# Patient Record
Sex: Female | Born: 1950 | Race: White | Hispanic: No | Smoking: Never smoker
Health system: Southern US, Community
[De-identification: ages and names within clinical notes are randomized; demographics above are authoritative.]

## PROBLEM LIST (undated history)

## (undated) DIAGNOSIS — K219 Gastro-esophageal reflux disease without esophagitis: Secondary | ICD-10-CM

## (undated) DIAGNOSIS — E119 Type 2 diabetes mellitus without complications: Secondary | ICD-10-CM

## (undated) DIAGNOSIS — E039 Hypothyroidism, unspecified: Secondary | ICD-10-CM

## (undated) DIAGNOSIS — C50919 Malignant neoplasm of unspecified site of unspecified female breast: Secondary | ICD-10-CM

## (undated) DIAGNOSIS — C801 Malignant (primary) neoplasm, unspecified: Secondary | ICD-10-CM

## (undated) DIAGNOSIS — I509 Heart failure, unspecified: Secondary | ICD-10-CM

## (undated) DIAGNOSIS — R569 Unspecified convulsions: Secondary | ICD-10-CM

## (undated) DIAGNOSIS — I10 Essential (primary) hypertension: Secondary | ICD-10-CM

## (undated) DIAGNOSIS — G43909 Migraine, unspecified, not intractable, without status migrainosus: Secondary | ICD-10-CM

## (undated) HISTORY — PX: APPENDECTOMY: SHX54

## (undated) HISTORY — PX: OTHER SURGICAL HISTORY: SHX169

## (undated) HISTORY — PX: ABDOMINAL HYSTERECTOMY: SHX81

## (undated) HISTORY — PX: KNEE SURGERY: SHX244

## (undated) HISTORY — PX: MASTECTOMY: SHX3

## (undated) HISTORY — PX: CHOLECYSTECTOMY: SHX55

## (undated) HISTORY — PX: ABDOMINAL SURGERY: SHX537

## (undated) HISTORY — PX: TONSILLECTOMY: SUR1361

---

## 2008-04-12 DIAGNOSIS — R569 Unspecified convulsions: Secondary | ICD-10-CM | POA: Diagnosis present

## 2009-01-26 DIAGNOSIS — N289 Disorder of kidney and ureter, unspecified: Secondary | ICD-10-CM | POA: Diagnosis not present

## 2010-05-25 DIAGNOSIS — G4700 Insomnia, unspecified: Secondary | ICD-10-CM | POA: Insufficient documentation

## 2010-05-25 DIAGNOSIS — F411 Generalized anxiety disorder: Secondary | ICD-10-CM | POA: Insufficient documentation

## 2012-04-17 DIAGNOSIS — E1142 Type 2 diabetes mellitus with diabetic polyneuropathy: Secondary | ICD-10-CM | POA: Insufficient documentation

## 2012-11-06 DIAGNOSIS — I119 Hypertensive heart disease without heart failure: Secondary | ICD-10-CM | POA: Insufficient documentation

## 2012-11-10 DIAGNOSIS — G43909 Migraine, unspecified, not intractable, without status migrainosus: Secondary | ICD-10-CM | POA: Insufficient documentation

## 2013-01-20 DIAGNOSIS — C50912 Malignant neoplasm of unspecified site of left female breast: Secondary | ICD-10-CM | POA: Insufficient documentation

## 2013-03-15 DIAGNOSIS — R52 Pain, unspecified: Secondary | ICD-10-CM | POA: Insufficient documentation

## 2013-03-16 DIAGNOSIS — D649 Anemia, unspecified: Secondary | ICD-10-CM | POA: Diagnosis present

## 2013-07-01 DIAGNOSIS — G8929 Other chronic pain: Secondary | ICD-10-CM | POA: Diagnosis present

## 2013-08-04 DIAGNOSIS — Z87898 Personal history of other specified conditions: Secondary | ICD-10-CM | POA: Insufficient documentation

## 2013-09-16 DIAGNOSIS — E785 Hyperlipidemia, unspecified: Secondary | ICD-10-CM | POA: Insufficient documentation

## 2013-10-05 DIAGNOSIS — R197 Diarrhea, unspecified: Secondary | ICD-10-CM | POA: Diagnosis present

## 2013-11-28 ENCOUNTER — Inpatient Hospital Stay (HOSPITAL_BASED_OUTPATIENT_CLINIC_OR_DEPARTMENT_OTHER)
Admission: EM | Admit: 2013-11-28 | Discharge: 2013-12-06 | DRG: 184 | Disposition: A | Discharge status: Home Health | Payer: Medicare PPO | Attending: General Surgery | Admitting: General Surgery

## 2013-11-28 ENCOUNTER — Emergency Department (HOSPITAL_BASED_OUTPATIENT_CLINIC_OR_DEPARTMENT_OTHER): Payer: Medicare PPO

## 2013-11-28 ENCOUNTER — Encounter (HOSPITAL_BASED_OUTPATIENT_CLINIC_OR_DEPARTMENT_OTHER): Payer: Self-pay | Admitting: Emergency Medicine

## 2013-11-28 ENCOUNTER — Other Ambulatory Visit: Payer: Self-pay

## 2013-11-28 DIAGNOSIS — E039 Hypothyroidism, unspecified: Secondary | ICD-10-CM | POA: Diagnosis not present

## 2013-11-28 DIAGNOSIS — E1165 Type 2 diabetes mellitus with hyperglycemia: Secondary | ICD-10-CM | POA: Diagnosis not present

## 2013-11-28 DIAGNOSIS — Z9221 Personal history of antineoplastic chemotherapy: Secondary | ICD-10-CM

## 2013-11-28 DIAGNOSIS — Z853 Personal history of malignant neoplasm of breast: Secondary | ICD-10-CM | POA: Diagnosis not present

## 2013-11-28 DIAGNOSIS — G3189 Other specified degenerative diseases of nervous system: Secondary | ICD-10-CM | POA: Diagnosis not present

## 2013-11-28 DIAGNOSIS — I672 Cerebral atherosclerosis: Secondary | ICD-10-CM | POA: Diagnosis present

## 2013-11-28 DIAGNOSIS — Z9013 Acquired absence of bilateral breasts and nipples: Secondary | ICD-10-CM | POA: Diagnosis present

## 2013-11-28 DIAGNOSIS — Z794 Long term (current) use of insulin: Secondary | ICD-10-CM | POA: Diagnosis not present

## 2013-11-28 DIAGNOSIS — R410 Disorientation, unspecified: Secondary | ICD-10-CM | POA: Diagnosis not present

## 2013-11-28 DIAGNOSIS — R4182 Altered mental status, unspecified: Secondary | ICD-10-CM

## 2013-11-28 DIAGNOSIS — R079 Chest pain, unspecified: Secondary | ICD-10-CM | POA: Diagnosis present

## 2013-11-28 DIAGNOSIS — S2241XA Multiple fractures of ribs, right side, initial encounter for closed fracture: Secondary | ICD-10-CM | POA: Diagnosis present

## 2013-11-28 DIAGNOSIS — S2231XA Fracture of one rib, right side, initial encounter for closed fracture: Secondary | ICD-10-CM

## 2013-11-28 DIAGNOSIS — N390 Urinary tract infection, site not specified: Secondary | ICD-10-CM | POA: Diagnosis not present

## 2013-11-28 DIAGNOSIS — G40909 Epilepsy, unspecified, not intractable, without status epilepticus: Secondary | ICD-10-CM | POA: Diagnosis present

## 2013-11-28 DIAGNOSIS — W1811XA Fall from or off toilet without subsequent striking against object, initial encounter: Secondary | ICD-10-CM | POA: Diagnosis present

## 2013-11-28 DIAGNOSIS — N289 Disorder of kidney and ureter, unspecified: Secondary | ICD-10-CM | POA: Diagnosis not present

## 2013-11-28 DIAGNOSIS — I10 Essential (primary) hypertension: Secondary | ICD-10-CM | POA: Diagnosis not present

## 2013-11-28 DIAGNOSIS — Z9882 Breast implant status: Secondary | ICD-10-CM | POA: Diagnosis not present

## 2013-11-28 DIAGNOSIS — S2249XA Multiple fractures of ribs, unspecified side, initial encounter for closed fracture: Secondary | ICD-10-CM | POA: Diagnosis present

## 2013-11-28 DIAGNOSIS — W19XXXA Unspecified fall, initial encounter: Secondary | ICD-10-CM

## 2013-11-28 HISTORY — DX: Essential (primary) hypertension: I10

## 2013-11-28 HISTORY — DX: Type 2 diabetes mellitus without complications: E11.9

## 2013-11-28 HISTORY — DX: Hypothyroidism, unspecified: E03.9

## 2013-11-28 HISTORY — DX: Migraine, unspecified, not intractable, without status migrainosus: G43.909

## 2013-11-28 HISTORY — DX: Malignant neoplasm of unspecified site of unspecified female breast: C50.919

## 2013-11-28 HISTORY — DX: Gastro-esophageal reflux disease without esophagitis: K21.9

## 2013-11-28 LAB — COMPREHENSIVE METABOLIC PANEL
ALK PHOS: 109 U/L (ref 39–117)
ALT: 39 U/L — ABNORMAL HIGH (ref 0–35)
AST: 37 U/L (ref 0–37)
Albumin: 3.6 g/dL (ref 3.5–5.2)
Anion gap: 13 (ref 5–15)
BILIRUBIN TOTAL: 0.6 mg/dL (ref 0.3–1.2)
BUN: 14 mg/dL (ref 6–23)
CHLORIDE: 96 meq/L (ref 96–112)
CO2: 28 meq/L (ref 19–32)
Calcium: 9.8 mg/dL (ref 8.4–10.5)
Creatinine, Ser: 0.6 mg/dL (ref 0.50–1.10)
GFR calc Af Amer: >90 mL/min (ref >90)
GLUCOSE: 284 mg/dL — AB (ref 70–99)
POTASSIUM: 3.7 meq/L (ref 3.7–5.3)
SODIUM: 137 meq/L (ref 137–147)
Total Protein: 7.9 g/dL (ref 6.0–8.3)

## 2013-11-28 LAB — CBC WITH DIFFERENTIAL/PLATELET
Basophils Absolute: 0.1 10*3/uL (ref 0.0–0.1)
Basophils Relative: 1 % (ref 0–1)
EOS ABS: 0.6 10*3/uL (ref 0.0–0.7)
EOS PCT: 6 % — AB (ref 0–5)
HCT: 35.6 % — ABNORMAL LOW (ref 36.0–46.0)
Hemoglobin: 12.6 g/dL (ref 12.0–15.0)
LYMPHS ABS: 2.7 10*3/uL (ref 0.7–4.0)
Lymphocytes Relative: 27 % (ref 12–46)
MCH: 31.8 pg (ref 26.0–34.0)
MCHC: 35.4 g/dL (ref 30.0–36.0)
MCV: 89.9 fL (ref 78.0–100.0)
Monocytes Absolute: 0.8 10*3/uL (ref 0.1–1.0)
Monocytes Relative: 8 % (ref 3–12)
Neutro Abs: 5.9 10*3/uL (ref 1.7–7.7)
Neutrophils Relative %: 58 % (ref 43–77)
Platelets: 353 10*3/uL (ref 150–400)
RBC: 3.96 MIL/uL (ref 3.87–5.11)
RDW: 12.8 % (ref 11.5–15.5)
WBC: 10 10*3/uL (ref 4.0–10.5)

## 2013-11-28 LAB — GLUCOSE, CAPILLARY: GLUCOSE-CAPILLARY: 277 mg/dL — AB (ref 70–99)

## 2013-11-28 LAB — MRSA PCR SCREENING: MRSA by PCR: NEGATIVE

## 2013-11-28 MED ORDER — HYDROMORPHONE 0.3 MG/ML IV SOLN
INTRAVENOUS | Status: DC
  Administered 2013-11-28: 20:00:00 via INTRAVENOUS
  Administered 2013-11-29: 1.2 mg via INTRAVENOUS
  Administered 2013-11-29: 0.9 mg via INTRAVENOUS
  Administered 2013-11-29: 2 mg via INTRAVENOUS
  Administered 2013-11-29: 0.9 mg via INTRAVENOUS
  Administered 2013-11-29: 1.2 mg via INTRAVENOUS
  Administered 2013-11-29: 1.8 mg via INTRAVENOUS
  Administered 2013-11-29: 15:00:00 via INTRAVENOUS
  Administered 2013-11-30: 1.5 mg via INTRAVENOUS
  Administered 2013-11-30 (×2): 0.6 mg via INTRAVENOUS
  Filled 2013-11-28 (×2): qty 25

## 2013-11-28 MED ORDER — NALOXONE HCL 0.4 MG/ML IJ SOLN: 0.4000 mg | INTRAMUSCULAR | Status: DC | PRN

## 2013-11-28 MED ORDER — DIVALPROEX SODIUM 500 MG PO DR TAB
500.0000 mg | DELAYED_RELEASE_TABLET | Freq: Three times a day (TID) | ORAL | Status: DC
  Administered 2013-11-28 – 2013-12-06 (×23): 500 mg via ORAL
  Filled 2013-11-28 (×25): qty 1

## 2013-11-28 MED ORDER — LISINOPRIL 10 MG PO TABS
10.0000 mg | ORAL_TABLET | Freq: Every day | ORAL | Status: DC
  Administered 2013-11-28 – 2013-12-06 (×9): 10 mg via ORAL
  Filled 2013-11-28 (×9): qty 1

## 2013-11-28 MED ORDER — PANTOPRAZOLE SODIUM 40 MG IV SOLR
40.0000 mg | INTRAVENOUS | Status: DC
  Filled 2013-11-28 (×3): qty 40

## 2013-11-28 MED ORDER — DIPHENHYDRAMINE HCL 50 MG/ML IJ SOLN: 12.5000 mg | Freq: Four times a day (QID) | INTRAMUSCULAR | Status: DC | PRN

## 2013-11-28 MED ORDER — ALPRAZOLAM 0.5 MG PO TABS
0.5000 mg | ORAL_TABLET | Freq: Four times a day (QID) | ORAL | Status: DC
  Administered 2013-11-28 – 2013-12-06 (×23): 0.5 mg via ORAL
  Filled 2013-11-28 (×23): qty 1

## 2013-11-28 MED ORDER — DIPHENHYDRAMINE HCL 12.5 MG/5ML PO ELIX
12.5000 mg | ORAL_SOLUTION | Freq: Four times a day (QID) | ORAL | Status: DC | PRN
  Filled 2013-11-28: qty 5

## 2013-11-28 MED ORDER — OXYCODONE HCL 5 MG PO TABS: 2.5000 mg | ORAL_TABLET | ORAL | Status: DC | PRN

## 2013-11-28 MED ORDER — SODIUM CHLORIDE 0.9 % IV BOLUS (SEPSIS)
1000.0000 mL | Freq: Once | INTRAVENOUS | Status: AC
  Administered 2013-11-28: 1000 mL via INTRAVENOUS

## 2013-11-28 MED ORDER — OXYCODONE HCL 5 MG PO TABS
5.0000 mg | ORAL_TABLET | ORAL | Status: DC | PRN
  Administered 2013-11-29 – 2013-11-30 (×2): 5 mg via ORAL
  Filled 2013-11-28 (×2): qty 1

## 2013-11-28 MED ORDER — HYDROMORPHONE HCL 1 MG/ML IJ SOLN
1.0000 mg | Freq: Once | INTRAMUSCULAR | Status: AC
  Administered 2013-11-28: 1 mg via INTRAVENOUS
  Filled 2013-11-28: qty 1

## 2013-11-28 MED ORDER — SODIUM CHLORIDE 0.9 % IV SOLN
Freq: Once | INTRAVENOUS | Status: AC
  Administered 2013-11-28: 16:00:00 via INTRAVENOUS

## 2013-11-28 MED ORDER — INSULIN ASPART 100 UNIT/ML ~~LOC~~ SOLN
0.0000 [IU] | Freq: Every day | SUBCUTANEOUS | Status: DC
  Administered 2013-11-28: 3 [IU] via SUBCUTANEOUS
  Administered 2013-11-29: 2 [IU] via SUBCUTANEOUS

## 2013-11-28 MED ORDER — PANTOPRAZOLE SODIUM 40 MG PO TBEC
40.0000 mg | DELAYED_RELEASE_TABLET | ORAL | Status: DC
  Administered 2013-11-28 – 2013-12-01 (×4): 40 mg via ORAL
  Filled 2013-11-28 (×4): qty 1

## 2013-11-28 MED ORDER — POLYETHYLENE GLYCOL 3350 17 G PO PACK
17.0000 g | PACK | Freq: Every day | ORAL | Status: DC
  Administered 2013-11-28 – 2013-12-05 (×8): 17 g via ORAL
  Filled 2013-11-28 (×9): qty 1

## 2013-11-28 MED ORDER — SODIUM CHLORIDE 0.9 % IJ SOLN: 9.0000 mL | INTRAMUSCULAR | Status: DC | PRN

## 2013-11-28 MED ORDER — ONDANSETRON HCL 4 MG PO TABS
4.0000 mg | ORAL_TABLET | Freq: Four times a day (QID) | ORAL | Status: DC | PRN
  Administered 2013-11-29 – 2013-12-03 (×2): 4 mg via ORAL
  Filled 2013-11-28 (×2): qty 1

## 2013-11-28 MED ORDER — GABAPENTIN 300 MG PO CAPS
300.0000 mg | ORAL_CAPSULE | Freq: Three times a day (TID) | ORAL | Status: DC
  Administered 2013-11-28 – 2013-12-06 (×23): 300 mg via ORAL
  Filled 2013-11-28 (×27): qty 1

## 2013-11-28 MED ORDER — ONDANSETRON HCL 4 MG/2ML IJ SOLN
4.0000 mg | Freq: Four times a day (QID) | INTRAMUSCULAR | Status: DC | PRN
  Administered 2013-11-29: 4 mg via INTRAVENOUS
  Filled 2013-11-28 (×2): qty 2

## 2013-11-28 MED ORDER — ONDANSETRON HCL 4 MG/2ML IJ SOLN
4.0000 mg | Freq: Once | INTRAMUSCULAR | Status: AC
  Administered 2013-11-28: 4 mg via INTRAVENOUS
  Filled 2013-11-28: qty 2

## 2013-11-28 MED ORDER — LEVOTHYROXINE SODIUM 200 MCG PO TABS
200.0000 ug | ORAL_TABLET | Freq: Every day | ORAL | Status: DC
  Administered 2013-11-29 – 2013-12-06 (×8): 200 ug via ORAL
  Filled 2013-11-28 (×2): qty 1
  Filled 2013-11-28: qty 2
  Filled 2013-11-28 (×5): qty 1
  Filled 2013-11-28: qty 2
  Filled 2013-11-28 (×2): qty 1

## 2013-11-28 MED ORDER — NIACIN 250 MG PO TABS
250.0000 mg | ORAL_TABLET | Freq: Every day | ORAL | Status: DC
  Administered 2013-11-28 – 2013-12-05 (×8): 250 mg via ORAL
  Filled 2013-11-28 (×9): qty 1

## 2013-11-28 MED ORDER — POTASSIUM CHLORIDE IN NACL 20-0.9 MEQ/L-% IV SOLN
INTRAVENOUS | Status: DC
  Administered 2013-11-29: 15:00:00 via INTRAVENOUS
  Filled 2013-11-28 (×6): qty 1000

## 2013-11-28 MED ORDER — OXYCODONE HCL 5 MG PO TABS
10.0000 mg | ORAL_TABLET | ORAL | Status: DC | PRN
  Administered 2013-11-28 – 2013-12-02 (×6): 10 mg via ORAL
  Filled 2013-11-28 (×7): qty 2

## 2013-11-28 MED ORDER — CYCLOBENZAPRINE HCL 10 MG PO TABS
10.0000 mg | ORAL_TABLET | Freq: Three times a day (TID) | ORAL | Status: DC | PRN
  Administered 2013-12-02 – 2013-12-06 (×2): 10 mg via ORAL
  Filled 2013-11-28 (×2): qty 1

## 2013-11-28 MED ORDER — ONDANSETRON HCL 4 MG/2ML IJ SOLN: 4.0000 mg | Freq: Four times a day (QID) | INTRAMUSCULAR | Status: DC | PRN

## 2013-11-28 MED ORDER — ENOXAPARIN SODIUM 40 MG/0.4ML ~~LOC~~ SOLN
40.0000 mg | SUBCUTANEOUS | Status: DC
  Administered 2013-11-28 – 2013-12-05 (×8): 40 mg via SUBCUTANEOUS
  Filled 2013-11-28 (×11): qty 0.4

## 2013-11-28 MED ORDER — INSULIN ASPART 100 UNIT/ML ~~LOC~~ SOLN
0.0000 [IU] | Freq: Three times a day (TID) | SUBCUTANEOUS | Status: DC
Start: 2013-11-29 — End: 2013-12-06
  Administered 2013-11-29 (×2): 3 [IU] via SUBCUTANEOUS
  Administered 2013-11-30: 2 [IU] via SUBCUTANEOUS
  Administered 2013-11-30: 3 [IU] via SUBCUTANEOUS
  Administered 2013-11-30: 2 [IU] via SUBCUTANEOUS
  Administered 2013-12-01: 3 [IU] via SUBCUTANEOUS
  Administered 2013-12-01: 5 [IU] via SUBCUTANEOUS
  Administered 2013-12-01 – 2013-12-05 (×7): 2 [IU] via SUBCUTANEOUS
  Administered 2013-12-05: 5 [IU] via SUBCUTANEOUS
  Administered 2013-12-06: 2 [IU] via SUBCUTANEOUS
  Administered 2013-12-06: 3 [IU] via SUBCUTANEOUS

## 2013-11-28 NOTE — ED Notes (Signed)
Dr. Blackman at bedside. 

## 2013-11-28 NOTE — ED Notes (Signed)
To ED form home via [facility], had breast surgery 11/2, fell 11/4, seen and dc that day, c/o right sided back and abd pain, VSS, CBG 309, A/O X4, ambulatory with difficulty, has not been taking home meds (insulin, HTN meds)

## 2013-11-28 NOTE — ED Notes (Signed)
Bruising noted to right rib/upper abdomen.

## 2013-11-28 NOTE — ED Notes (Addendum)
Pt here for trauma consult for ribs fracture.

## 2013-11-28 NOTE — ED Provider Notes (Signed)
CSN: [id number]     Arrival date & time 11/28/13  1116 History   First MD Initiated Contact with Patient 11/28/13 1144     Chief Complaint  Patient presents with  . Fall  . Post-op Problem     (Consider location/radiation/quality/duration/timing/severity/associated sxs/prior Treatment) HPI Comments: Patient is a 63 year old female with history of diabetes and breast cancer. She has been through chemotherapy and recently underwent breast reconstruction surgery. This was one week ago. She was doing well for the first 2 days, then on the third day apparently fell when her bedside commode collapsed. She was seen at [facility] and had multiple imaging studies performed, none of which revealed any fracture or acute injury. She was discharged to home where she states she is unable to function. She reports severe pain in her right rear lower rib cage and back. She states it makes it difficult for her to move or even roll over in bed. She lives at home by herself and states that she has been into much pain to take her insulin and other medications.  Patient is a 63 y.o. female presenting with fall. The history is provided by the patient.  Fall This is a new problem. The current episode started 2 days ago. The problem occurs constantly. The problem has not changed since onset.Pertinent negatives include no shortness of breath. Exacerbated by: movement and palpation. Nothing relieves the symptoms. She has tried nothing for the symptoms.    Past Medical History  Diagnosis Date  . Hypertension   . Breast cancer   . Diabetes mellitus without complication   . Migraine   . Hypothyroid   . Epilepsy   . Esophageal reflux    Past Surgical History  Procedure Laterality Date  . Mastectomy    . Knee surgery    . Appendectomy    . Abdominal hysterectomy     No family history on file. History  Substance Use Topics  . Smoking status: Never Smoker   . Smokeless tobacco: Not on file  . Alcohol  Use: No   OB History    No data available     Review of Systems  Respiratory: Negative for shortness of breath.   All other systems reviewed and are negative.     Allergies  Aspirin; Erythromycin; Maxzide; Metformin and related; Morphine and related; Nsaids; Other; Pravachol; Stadol; and Vistaril  Home Medications   Prior to Admission medications   Medication Sig Start Date End Date Taking? Authorizing Provider  cyclobenzaprine (FLEXERIL) 10 MG tablet Take 10 mg by mouth 3 (three) times daily as needed for muscle spasms.   Yes Historical Provider, MD  divalproex (DEPAKOTE) 500 MG DR tablet Take 500 mg by mouth 3 (three) times daily.   Yes Historical Provider, MD  gabapentin (NEURONTIN) 300 MG capsule Take 300 mg by mouth 3 (three) times daily.   Yes Historical Provider, MD  HYDROmorphone (DILAUDID) 2 MG tablet Take 2 mg by mouth every 4 (four) hours as needed for severe pain.   Yes Historical Provider, MD  insulin aspart (NOVOLOG) 100 UNIT/ML injection Inject into the skin 3 (three) times daily before meals.   Yes Historical Provider, MD  insulin glargine (LANTUS) 100 UNIT/ML injection Inject 30 Units into the skin daily.   Yes Historical Provider, MD  levothyroxine (SYNTHROID, LEVOTHROID) 200 MCG tablet Take 200 mcg by mouth daily before breakfast.   Yes Historical Provider, MD  lisinopril (PRINIVIL,ZESTRIL) 10 MG tablet Take 10 mg by mouth daily.  Yes Historical Provider, MD  loperamide (IMODIUM) 2 MG capsule Take by mouth as needed for diarrhea or loose stools.   Yes Historical Provider, MD  niacin 250 MG tablet Take 250 mg by mouth at bedtime.   Yes Historical Provider, MD  ondansetron (ZOFRAN-ODT) 4 MG disintegrating tablet Take 4 mg by mouth every 8 (eight) hours as needed for nausea or vomiting.   Yes Historical Provider, MD  senna-docusate (SENOKOT-S) 8.6-50 MG per tablet Take 1 tablet by mouth daily.   Yes Historical Provider, MD  zolpidem (AMBIEN) 10 MG tablet Take 10 mg by  mouth at bedtime as needed for sleep.   Yes Historical Provider, MD   BP 173/92 mmHg  Pulse 109  Temp(Src) 97.9 F (36.6 C) (Oral)  Resp 20  SpO2 99% Physical Exam  Constitutional: She is oriented to person, place, and time.  Patient is very anxious and tearful. She is sobbing. She appears chronically ill and somewhat pale.  HENT:  Head: Normocephalic and atraumatic.  Eyes: EOM are normal. Pupils are equal, round, and reactive to light.  Neck: Normal range of motion. Neck supple.  Cardiovascular: Normal rate and regular rhythm.  Exam reveals no gallop and no friction rub.   No murmur heard. Pulmonary/Chest: Effort normal and breath sounds normal. No respiratory distress. She has no wheezes.  There is an ecchymotic area to the right rear lower rib cage and flank. There is no crepitus.  Abdominal: Soft. Bowel sounds are normal. She exhibits no distension. There is no tenderness.  Musculoskeletal: Normal range of motion.  Neurological: She is alert and oriented to person, place, and time. No cranial nerve deficit. She exhibits normal muscle tone. Coordination normal.  Skin: Skin is warm and dry. She is not diaphoretic.  Nursing note and vitals reviewed.   ED Course  Procedures (including critical care time) Labs Review Labs Reviewed  COMPREHENSIVE METABOLIC PANEL  CBC WITH DIFFERENTIAL  URINALYSIS, ROUTINE W REFLEX MICROSCOPIC    Imaging Review No results found.   Date: 11/29/2013  Rate: 106  Rhythm: sinus tach  QRS Axis: normal  Intervals: normal  ST/T Wave abnormalities: none  Conduction Disutrbances:none  Narrative Interpretation:   Old EKG Reviewed: none available    MDM   Final diagnoses:  Fall    Patient presents with severe pain in the right ribs since a fall four days ago.  She was seen at [facility] and nothing was found.  Xrays today reveal fractures of ribs 4-9 on the right.  She was given pain medication but has not helped much.  Her electrolytes are  unremarkable with the exception of hyperglycemia.    I have discussed the rib fractures with Dr. Ninfa Linden from trauma.  He has agreed to accept the patient in transfer.  The patient has requested to not go back to [place] where she has received the majority of her care.      Veryl Speak, MD 11/29/13 [phone]

## 2013-11-28 NOTE — ED Notes (Signed)
[facility] to bedside to transfer pt, bedside report given to [employer]

## 2013-11-28 NOTE — H&P (Signed)
History   Andrea [name] is an 63 y.o. female.   Chief Complaint:  Chief Complaint  Patient presents with  . Fall  . Post-op Problem    Fall  this is a 63 year old female who presents after a fall that occurred several days ago. She is a breast cancer patient and underwent exchange of her tissue expanders to formal implants bilaterally at [facility] last week. She fell going to her bedside commode several days ago. She presented to [facility] with right-sided chest pain. Apparently a complete workup including CAT scans were unremarkable so she was discharged. She presented to [facility] [place] today because of increasing pain. She does not have shortness of breath but has chest pain trying to breathe deeply. This is all on the right side. She has some right-sided abdominal pain as well. She has no nausea or vomiting. She did not hit her head and there was no loss of consciousness.  Past Medical History  Diagnosis Date  . Hypertension   . Breast cancer   . Diabetes mellitus without complication   . Migraine   . Hypothyroid   . Epilepsy   . Esophageal reflux     Past Surgical History  Procedure Laterality Date  . Mastectomy    . Knee surgery    . Appendectomy    . Abdominal hysterectomy    Port-A-Cath insertion Bilateral tissue expander insertion and then formal placement of bilateral implants  No family history on file. Social History:  reports that she has never smoked. She does not have any smokeless tobacco history on file. She reports that she does not drink alcohol. Her drug history is not on file.  Allergies   Allergies  Allergen Reactions  . Aspirin   . Erythromycin   . Maxzide [Hydrochlorothiazide W-Triamterene]   . Metformin And Related   . Morphine And Related   . Nsaids   . Other     All steroids produce psychosis per pt  . Pravachol [Pravastatin]   . Stadol [Butorphanol]   . Vistaril [Hydroxyzine Hcl]     Home Medications   (Not in a  hospital admission)  Trauma Course   Results for orders placed or performed during the hospital encounter of 11/28/13 (from the past 48 hour(s))  Comprehensive metabolic panel     Status: Abnormal   Collection Time: 11/28/13  1:00 PM  Result Value Ref Range   Sodium 137 137 - 147 mEq/L   Potassium 3.7 3.7 - 5.3 mEq/L   Chloride 96 96 - 112 mEq/L   CO2 28 19 - 32 mEq/L   Glucose, Bld 284 (H) 70 - 99 mg/dL   BUN 14 6 - 23 mg/dL   Creatinine, Ser 0.60 0.50 - 1.10 mg/dL   Calcium 9.8 8.4 - 10.5 mg/dL   Total Protein 7.9 6.0 - 8.3 g/dL   Albumin 3.6 3.5 - 5.2 g/dL   AST 37 0 - 37 U/L   ALT 39 (H) 0 - 35 U/L   Alkaline Phosphatase 109 39 - 117 U/L   Total Bilirubin 0.6 0.3 - 1.2 mg/dL   GFR calc non Af Amer >90 >90 mL/min   GFR calc Af Amer >90 >90 mL/min    Comment: (NOTE) The eGFR has been calculated using the CKD EPI equation. This calculation has not been validated in all clinical situations. eGFR's persistently <90 mL/min signify possible Chronic Kidney Disease.    Anion gap 13 5 - 15  CBC with Differential  Status: Abnormal   Collection Time: 11/28/13  1:00 PM  Result Value Ref Range   WBC 10.0 4.0 - 10.5 K/uL   RBC 3.96 3.87 - 5.11 MIL/uL   Hemoglobin 12.6 12.0 - 15.0 g/dL   HCT 35.6 (L) 36.0 - 46.0 %   MCV 89.9 78.0 - 100.0 fL   MCH 31.8 26.0 - 34.0 pg   MCHC 35.4 30.0 - 36.0 g/dL   RDW 12.8 11.5 - 15.5 %   Platelets 353 150 - 400 K/uL   Neutrophils Relative % 58 43 - 77 %   Neutro Abs 5.9 1.7 - 7.7 K/uL   Lymphocytes Relative 27 12 - 46 %   Lymphs Abs 2.7 0.7 - 4.0 K/uL   Monocytes Relative 8 3 - 12 %   Monocytes Absolute 0.8 0.1 - 1.0 K/uL   Eosinophils Relative 6 (H) 0 - 5 %   Eosinophils Absolute 0.6 0.0 - 0.7 K/uL   Basophils Relative 1 0 - 1 %   Basophils Absolute 0.1 0.0 - 0.1 K/uL   Dg Ribs Unilateral W/chest Right  11/28/2013   CLINICAL DATA:  Fall, right anterior rib pain, history of bilateral breast cancer status post bilateral mastectomy with  chemotherapy  EXAM: RIGHT RIBS AND CHEST - 3+ VIEW  COMPARISON:  None.  FINDINGS: Mild patchy right lower lobe opacity, likely atelectasis versus pulmonary contusion. No pleural effusion or pneumothorax.  The heart is normal in size. Right chest port terminates in the lower SVC.  Nondisplaced right lateral 4th through 9th rib fractures.  IMPRESSION: Nondisplaced right lateral 4th through 9th rib fractures.  No pneumothorax is seen.  Mild patchy right lower lobe opacity, likely atelectasis versus pulmonary contusion.   Electronically Signed   By: Julian Hy M.D.   On: 11/28/2013 12:48    Review of Systems  All other systems reviewed and are negative.   Blood pressure 150/80, pulse 102, temperature 98.4 F (36.9 C), temperature source Oral, resp. rate 16, height 5' 5" (1.651 m), weight 154 lb (69.854 kg), SpO2 96 %. Physical Exam  Constitutional: She is oriented to person, place, and time. She appears well-developed and well-nourished. She appears distressed.  She is uncomfortable in appearance  HENT:  Head: Normocephalic and atraumatic.  Right Ear: External ear normal.  Left Ear: External ear normal.  Nose: Nose normal.  Mouth/Throat: Oropharynx is clear and moist. No oropharyngeal exudate.  Eyes: Conjunctivae are normal. Pupils are equal, round, and reactive to light. Right eye exhibits no discharge. Left eye exhibits no discharge. No scleral icterus.  Neck: Normal range of motion. No tracheal deviation present.  Cardiovascular: Regular rhythm and normal heart sounds.   Mildly tachycardic  Respiratory: She has rales. She exhibits tenderness.  Bilateral chest wall tenderness greater on the right. Coarse breath sounds bilaterally  GI: Soft. There is tenderness.  Tenderness with guarding across her upper abdomen  Musculoskeletal: Normal range of motion. She exhibits no edema or tenderness.  Lymphadenopathy:    She has no cervical adenopathy.  Neurological: She is alert and oriented  to person, place, and time.  Skin: Skin is warm and dry. She is not diaphoretic. No erythema.  Psychiatric: Her behavior is normal. Judgment normal.     Assessment/Plan Multiple right rib fractures status post fall  She will be admitted to the stepdown unit for pain control and aggressive pulmonary toilet. Her x-ray will be repeated in the morning. She may have some pulmonary contusion. We may have to obtain the  records from her other hospital regarding her x-ray studies. I explained to her the risk of pneumonia developing with multiple rib fractures. Hopefully we will be able to avoid this with her pain control and incentive spirometry.  , A 11/28/2013, 5:27 PM   Procedures

## 2013-11-29 ENCOUNTER — Inpatient Hospital Stay (HOSPITAL_COMMUNITY): Payer: Medicare PPO

## 2013-11-29 ENCOUNTER — Encounter (HOSPITAL_COMMUNITY): Payer: Self-pay | Admitting: *Deleted

## 2013-11-29 LAB — GLUCOSE, CAPILLARY
Glucose-Capillary: 220 mg/dL — ABNORMAL HIGH (ref 70–99)
Glucose-Capillary: 175 mg/dL — ABNORMAL HIGH (ref 70–99)
Glucose-Capillary: 199 mg/dL — ABNORMAL HIGH (ref 70–99)
Glucose-Capillary: 234 mg/dL — ABNORMAL HIGH (ref 70–99)

## 2013-11-29 LAB — URINALYSIS, ROUTINE W REFLEX MICROSCOPIC
Hgb urine dipstick: NEGATIVE
KETONES UR: 15 mg/dL — AB
LEUKOCYTES UA: NEGATIVE
Nitrite: NEGATIVE
Protein, ur: 100 mg/dL — AB
Specific Gravity, Urine: 1.028 (ref 1.005–1.030)
Urobilinogen, UA: 0.2 mg/dL (ref 0.0–1.0)
pH: 6 (ref 5.0–8.0)

## 2013-11-29 LAB — CBC
HEMATOCRIT: 32.4 % — AB (ref 36.0–46.0)
Hemoglobin: 11 g/dL — ABNORMAL LOW (ref 12.0–15.0)
MCH: 30.9 pg (ref 26.0–34.0)
MCHC: 34 g/dL (ref 30.0–36.0)
MCV: 91 fL (ref 78.0–100.0)
Platelets: 306 10*3/uL (ref 150–400)
RBC: 3.56 MIL/uL — ABNORMAL LOW (ref 3.87–5.11)
RDW: 13.1 % (ref 11.5–15.5)
WBC: 9.2 10*3/uL (ref 4.0–10.5)

## 2013-11-29 LAB — BASIC METABOLIC PANEL
Anion gap: 13 (ref 5–15)
BUN: 22 mg/dL (ref 6–23)
CHLORIDE: 101 meq/L (ref 96–112)
CO2: 24 meq/L (ref 19–32)
Calcium: 8.9 mg/dL (ref 8.4–10.5)
Creatinine, Ser: 1.01 mg/dL (ref 0.50–1.10)
GFR calc Af Amer: 68 mL/min — ABNORMAL LOW (ref >90)
GFR calc non Af Amer: 58 mL/min — ABNORMAL LOW (ref >90)
GLUCOSE: 201 mg/dL — AB (ref 70–99)
POTASSIUM: 4.5 meq/L (ref 3.7–5.3)
Sodium: 138 mEq/L (ref 137–147)

## 2013-11-29 LAB — URINE MICROSCOPIC-ADD ON

## 2013-11-29 MED ORDER — INFLUENZA VAC SPLIT QUAD 0.5 ML IM SUSY
0.5000 mL | PREFILLED_SYRINGE | INTRAMUSCULAR | Status: AC
  Administered 2013-11-30: 0.5 mL via INTRAMUSCULAR
  Filled 2013-11-29: qty 0.5

## 2013-11-29 MED ORDER — INSULIN GLARGINE 100 UNIT/ML ~~LOC~~ SOLN
20.0000 [IU] | Freq: Every day | SUBCUTANEOUS | Status: DC
  Administered 2013-11-29 – 2013-11-30 (×2): 20 [IU] via SUBCUTANEOUS
  Filled 2013-11-29 (×2): qty 0.2

## 2013-11-29 NOTE — Progress Notes (Signed)
UR completed.  Aldo Sondgeroth, RN BSN MHA CCM Trauma/Neuro ICU Case Manager [phone]  

## 2013-11-29 NOTE — Progress Notes (Signed)
Physician notified: Thompson At: 1230  Regarding: Noted transfer to floor in note, no order. Thanks.  Awaiting return response.   Returned Response at: 1230  Order(s): will place order for transfer.

## 2013-11-29 NOTE — Progress Notes (Addendum)
Patient ID: Andrea [name], female   DOB: 20-Nov-1950, 63 y.o.   MRN: [id number]    Subjective: R rib pain  Objective: Vital signs in last 24 hours: Temp:  [97.5 F (36.4 C)-98.4 F (36.9 C)] 97.9 F (36.6 C) (11/09 0818) Pulse Rate:  [90-110] 91 (11/09 0726) Resp:  [11-20] 19 (11/09 0800) BP: (102-173)/(57-100) 121/57 mmHg (11/09 0726) SpO2:  [95 %-100 %] 97 % (11/09 0800) Weight:  [151 lb (68.493 kg)-165 lb 2 oz (74.9 kg)] 165 lb 2 oz (74.9 kg) (11/09 0433)    Intake/Output from previous day: 11/08 0701 - 11/09 0700 In: 1065 [P.O.:240; I.V.:825] Out: 400 [Urine:400] Intake/Output this shift:    Physical Exam  Cardiovascular: Normal rate and regular rhythm.   Pulmonary/Chest: Effort normal. No respiratory distress. She has no wheezes. She exhibits tenderness.  R chest wall tenderness  Abdominal: Soft. Bowel sounds are normal. She exhibits no distension. There is no tenderness.  Breast: B implant incisions CDI with wound dressings   Lab Results: CBC   Recent Labs  11/28/13 1300 11/29/13 0230  WBC 10.0 9.2  HGB 12.6 11.0*  HCT 35.6* 32.4*  PLT 353 306   BMET  Recent Labs  11/28/13 1300 11/29/13 0230  NA 137 138  K 3.7 4.5  CL 96 101  CO2 28 24  GLUCOSE 284* 201*  BUN 14 22  CREATININE 0.60 1.01  CALCIUM 9.8 8.9   PT/INR No results for input(s): LABPROT, INR in the last 72 hours. ABG No results for input(s): PHART, HCO3 in the last 72 hours.  Invalid input(s): PCO2, PO2  Studies/Results: Dg Ribs Unilateral W/chest Right  11/28/2013   CLINICAL DATA:  Fall, right anterior rib pain, history of bilateral breast cancer status post bilateral mastectomy with chemotherapy  EXAM: RIGHT RIBS AND CHEST - 3+ VIEW  COMPARISON:  None.  FINDINGS: Mild patchy right lower lobe opacity, likely atelectasis versus pulmonary contusion. No pleural effusion or pneumothorax.  The heart is normal in size. Right chest port terminates in the lower SVC.  Nondisplaced right  lateral 4th through 9th rib fractures.  IMPRESSION: Nondisplaced right lateral 4th through 9th rib fractures.  No pneumothorax is seen.  Mild patchy right lower lobe opacity, likely atelectasis versus pulmonary contusion.   Electronically Signed   By: Julian Hy M.D.   On: 11/28/2013 12:48   Dg Chest Port 1 View  11/29/2013   CLINICAL DATA:  Multiple right rib fractures.  EXAM: PORTABLE CHEST - 1 VIEW  COMPARISON:  Rib series 11/28/2013.  FINDINGS: Mediastinum hilar structures are normal. Bibasilar atelectasis is present. No significant pleural effusion. No pneumothorax. Heart size and pulmonary vascularity stable. Multiple right rib fractures are best demonstrated on prior right rib series.  IMPRESSION: 1. Bibasilar atelectasis. 2. Multiple right rib fractures are best demonstrated on prior right rib series 11 08/2013. No pneumothorax.   Electronically Signed   By: Marcello Moores  Register   On: 11/29/2013 07:19    Anti-infectives: Anti-infectives    None      Assessment/Plan: Fall R rib FX 4-9 - CXR OK, pulmonary toilet, using PCA a lot so continue that today, oxy IR Recent B breast reconstruction PT/OT DM - SSI and start lantus at a bit lower than home dose and will follow FEN - decrease IVF some Dispo - to floor, lives alone   LOS: 1 day    Georganna Skeans, MD, MPH, FACS Trauma: [phone] General Surgery: [phone]  11/29/2013

## 2013-11-29 NOTE — Evaluation (Signed)
Physical Therapy Evaluation Patient Details Name: Andrea [name] MRN: [id number] DOB: Mar 13, 1950 Today's Date: 11/29/2013   History of Present Illness  Adm 11/28/13 after fall getting off BSC ("it collapsed"). Pt had breast implant surgery 11/22/13 (after breast cancer) PMHx-breast cancer; epilepsy, DM  Clinical Impression  Pt admitted with multiple rib fractures after fall at home s/p recent surgery. (Fall apparently due to equipment failure--BSC folded under her). Pt currently with functional limitations due to the deficits listed below (see PT Problem List). Pt lives alone with only intermittent assistance by family. Pain control currently a major limiting factor to her mobility. Pt will benefit from skilled PT to increase their independence and safety with mobility to allow discharge to the venue listed below.       Follow Up Recommendations CIR    Equipment Recommendations  None recommended by PT (OT to assess shower seat needs)    Recommendations for Other Services OT consult     Precautions / Restrictions Precautions Precautions: Fall      Mobility  Bed Mobility Overal bed mobility: Needs Assistance Bed Mobility: Rolling;Sidelying to Sit Rolling: Supervision (with rail) Sidelying to sit: Min assist;HOB elevated (30)       General bed mobility comments: vc for technique; pt anxious and hurting, therefore HOB up and rail used with explanation that she will not have these at home. Discussed possibly sleeping in her recliner  Transfers Overall transfer level: Needs assistance Equipment used: None;Rolling walker (2 wheeled) Transfers: Sit to/from Stand Sit to Stand: Min guard         General transfer comment: to stand with bed rail; to sit with RW and vc for technique  Ambulation/Gait Ambulation/Gait assistance: Min assist Ambulation Distance (Feet): 14 Feet (then 2 (recliner to [facility])) Assistive device: Rolling walker (2 wheeled) (IV pole and then RW) Gait  Pattern/deviations: Step-to pattern;Decreased stride length;Shuffle;Antalgic;Trunk flexed;Wide base of support Gait velocity: very slow Gait velocity interpretation: <1.8 ft/sec, indicative of risk for recurrent falls General Gait Details: Pt stated she did not want to use a RW as she thought it would hurt too much (pushing with RUE). Began holding IV pole in Lt hand, and soon was holding in both hands. Then agreed to use of RW. ~45 minutes to get OOB and walk 26ft, then transfer to [facility]    Modified Rankin (Stroke Patients Only)       Balance Overall balance assessment: No apparent balance deficits (not formally assessed) (pt's fall result of BSC "collapsing")                                           Pertinent Vitals/Pain HR 102-126 with activity Became nauseous while walking  Pain Assessment: 0-10 Pain Score: 10-Worst pain ever Pain Location: abd and Rt side Pain Descriptors / Indicators: Cramping;Jabbing Pain Intervention(s): Limited activity within patient's tolerance;Monitored during session;Premedicated before session;Repositioned;Patient requesting pain meds-RN notified;PCA encouraged;Other (comment) (splinting with pillow to Rt ribs)    Home Living Family/patient expects to be discharged to:: Private residence Living Arrangements: Alone Available Help at Discharge: Family;Available PRN/intermittently (sons ([place] and [place])) Type of Home: House ([place]) Home Access: Stairs to enter Entrance Stairs-Rails: None Entrance Stairs-Number of Steps: 1 Home Layout: Two level;Bed/bath upstairs;1/2 bath on main level Home Equipment: Bedside commode;Walker - 2 wheels;Grab bars - tub/shower;Hand  held shower head (tub/shower; has recliner) Additional Comments: Sisters came to stay with her after recent surgery x 2-3 days. They are not planning to return at this time. Plans to buy twin bed and put downstairs     Prior Function Level of Independence: Independent with assistive device(s)         Comments: would use RW in community; grab bars in shower     Hand Dominance   Dominant Hand: Right    Extremity/Trunk Assessment   Upper Extremity Assessment: Defer to OT evaluation           Lower Extremity Assessment: Overall WFL for tasks assessed      Cervical / Trunk Assessment: Kyphotic  Communication   Communication: No difficulties  Cognition Arousal/Alertness: Awake/alert Behavior During Therapy: Flat affect Overall Cognitive Status: Within Functional Limits for tasks assessed                      General Comments      Exercises Other Exercises Other Exercises: Educated on use of incentive spirometer and benefits. Pt verbalized understanding      Assessment/Plan    PT Assessment Patient needs continued PT services  PT Diagnosis Acute pain;Difficulty walking   PT Problem List Decreased activity tolerance;Decreased mobility;Decreased knowledge of use of DME;Pain  PT Treatment Interventions DME instruction;Gait training;Stair training;Functional mobility training;Therapeutic activities;Patient/family education   PT Goals (Current goals can be found in the Care Plan section) Acute Rehab PT Goals Patient Stated Goal: be able to return home alone; staying on first floor PT Goal Formulation: With patient Time For Goal Achievement: 12/06/13 Potential to Achieve Goals: Good    Frequency Min 4X/week   Barriers to discharge Decreased caregiver support Only has intermittent assist from family    Co-evaluation               End of Session Equipment Utilized During Treatment:  (no gait belt due to rib fractures) Activity Tolerance: Patient limited by pain;Treatment limited secondary to medical complications (Comment) (nausea) Patient left: with call bell/phone within reach;with nursing/sitter in room (on [facility]) Nurse Communication: Mobility status          Time: [id number] PT Time Calculation (min): 61 min   Charges:   PT Evaluation $Initial PT Evaluation Tier I: 1 Procedure PT Treatments $Gait Training: 23-37 mins $Therapeutic Activity: 8-22 mins   PT G Codes:          Andrea [name] 12-27-2013, 3:38 PM  Pager [phone]

## 2013-11-29 NOTE — Progress Notes (Signed)
Rehab Admissions Coordinator Note:  Patient was screened by Retta Diones for appropriateness for an Inpatient Acute Rehab Consult.  At this time, we are recommending [place] or [facility] therapies.  Patient has [employer] and it is very unlikely that we could get authorization for an acute inpatient rehab admission.  Call me for questions.  Retta Diones 11/29/2013, 3:54 PM  I can be reached at [phone].

## 2013-11-30 ENCOUNTER — Inpatient Hospital Stay (HOSPITAL_COMMUNITY): Payer: Medicare PPO

## 2013-11-30 DIAGNOSIS — S2241XA Multiple fractures of ribs, right side, initial encounter for closed fracture: Secondary | ICD-10-CM | POA: Diagnosis not present

## 2013-11-30 LAB — GLUCOSE, CAPILLARY
GLUCOSE-CAPILLARY: 162 mg/dL — AB (ref 70–99)
Glucose-Capillary: 147 mg/dL — ABNORMAL HIGH (ref 70–99)
Glucose-Capillary: 124 mg/dL — ABNORMAL HIGH (ref 70–99)
Glucose-Capillary: 194 mg/dL — ABNORMAL HIGH (ref 70–99)

## 2013-11-30 LAB — BASIC METABOLIC PANEL
Anion gap: 12 (ref 5–15)
BUN: 21 mg/dL (ref 6–23)
CO2: 26 mEq/L (ref 19–32)
Calcium: 8.8 mg/dL (ref 8.4–10.5)
Chloride: 99 mEq/L (ref 96–112)
Creatinine, Ser: 0.85 mg/dL (ref 0.50–1.10)
GFR calc Af Amer: 83 mL/min — ABNORMAL LOW (ref >90)
GFR, EST NON AFRICAN AMERICAN: 72 mL/min — AB (ref >90)
GLUCOSE: 152 mg/dL — AB (ref 70–99)
POTASSIUM: 4.7 meq/L (ref 3.7–5.3)
Sodium: 137 mEq/L (ref 137–147)

## 2013-11-30 LAB — CBC
HCT: 29.9 % — ABNORMAL LOW (ref 36.0–46.0)
Hemoglobin: 10.2 g/dL — ABNORMAL LOW (ref 12.0–15.0)
MCH: 31.2 pg (ref 26.0–34.0)
MCHC: 34.1 g/dL (ref 30.0–36.0)
MCV: 91.4 fL (ref 78.0–100.0)
Platelets: 294 10*3/uL (ref 150–400)
RBC: 3.27 MIL/uL — ABNORMAL LOW (ref 3.87–5.11)
RDW: 13.2 % (ref 11.5–15.5)
WBC: 7.9 10*3/uL (ref 4.0–10.5)

## 2013-11-30 MED ORDER — HYDROMORPHONE HCL 1 MG/ML IJ SOLN
0.5000 mg | INTRAMUSCULAR | Status: DC | PRN
  Administered 2013-11-30 – 2013-12-02 (×8): 1 mg via INTRAVENOUS
  Filled 2013-11-30 (×8): qty 1

## 2013-11-30 MED ORDER — INSULIN GLARGINE 100 UNIT/ML ~~LOC~~ SOLN
30.0000 [IU] | Freq: Every day | SUBCUTANEOUS | Status: DC
  Administered 2013-12-01 – 2013-12-06 (×6): 30 [IU] via SUBCUTANEOUS
  Filled 2013-11-30 (×6): qty 0.3

## 2013-11-30 NOTE — Progress Notes (Signed)
Patient ID: Andrea [name], female   DOB: 1950-07-30, 63 y.o.   MRN: [id number]    Subjective: Pain is a little better, upset that her siblings are not being supportive  Objective: Vital signs in last 24 hours: Temp:  [96.6 F (35.9 C)-98.6 F (37 C)] 98.2 F (36.8 C) (11/10 0700) Pulse Rate:  [97-105] 102 (11/10 0700) Resp:  [10-20] 20 (11/10 0800) BP: (109-133)/(54-88) 133/88 mmHg (11/10 0700) SpO2:  [92 %-97 %] 94 % (11/10 0800) Last BM Date:  (PTA, unknown)  Intake/Output from previous day: 11/09 0701 - 11/10 0700 In: 2020 [P.O.:720; I.V.:1300] Out: 675 [Urine:675] Intake/Output this shift: Total I/O In: 100 [I.V.:100] Out: -   General appearance: alert and cooperative Resp: clear to auscultation bilaterally Chest wall: right sided chest wall tenderness Cardio: regular rate and rhythm GI: soft, nontender  Lab Results: CBC   Recent Labs  11/29/13 0230 11/30/13 0330  WBC 9.2 7.9  HGB 11.0* 10.2*  HCT 32.4* 29.9*  PLT 306 294   BMET  Recent Labs  11/29/13 0230 11/30/13 0330  NA 138 137  K 4.5 4.7  CL 101 99  CO2 24 26  GLUCOSE 201* 152*  BUN 22 21  CREATININE 1.01 0.85  CALCIUM 8.9 8.8    Anti-infectives: Anti-infectives    None      Assessment/Plan: Fall R rib FX 4-9 - pulmonary toilet, D/C PCA Recent B breast reconstruction PT/OT DM - SSI and increase Lantus FEN - SL IV Dispo - to floor, lives alone but son is arranging bed downstairs for her   LOS: 2 days    Georganna Skeans, MD, MPH, FACS Trauma: [phone] General Surgery: [phone]  11/30/2013

## 2013-11-30 NOTE — Progress Notes (Signed)
Attempted to call report to 6N. Call back number given. Waiting for call back.

## 2013-11-30 NOTE — Progress Notes (Signed)
Patient present on floor. Transferred from 3S. Report received from [place], [place]. Patient stable.

## 2013-11-30 NOTE — Plan of Care (Signed)
Problem: Consults Goal: General Medical Patient Education See Patient Education Module for specific education.  Outcome: Progressing Goal: Nutrition Consult-if indicated Outcome: Not Applicable Date Met:  96/43/83 Goal: Diabetes Guidelines if Diabetic/Glucose > 140 If diabetic or lab glucose is > 140 mg/dl - Initiate Diabetes/Hyperglycemia Guidelines & Document Interventions  Outcome: Progressing  Problem: Phase I Progression Outcomes Goal: OOB as tolerated unless otherwise ordered Outcome: Progressing Goal: Initial discharge plan identified Outcome: Progressing Goal: Voiding-avoid urinary catheter unless indicated Outcome: Progressing Goal: Hemodynamically stable Outcome: Progressing  Problem: Phase II Progression Outcomes Goal: Progress activity as tolerated unless otherwise ordered Outcome: Progressing Goal: Discharge plan established Outcome: Progressing Goal: Vital signs remain stable Outcome: Progressing Goal: IV changed to normal saline lock Outcome: Completed/Met Date Met:  11/30/13 Goal: Obtain order to discontinue catheter if appropriate Outcome: Progressing  Problem: Phase III Progression Outcomes Goal: Pain controlled on oral analgesia Outcome: Progressing Goal: Activity at appropriate level-compared to baseline (UP IN CHAIR FOR HEMODIALYSIS)  Outcome: Progressing Goal: Voiding independently Outcome: Progressing Goal: IV/normal saline lock discontinued Outcome: Progressing Goal: Foley discontinued Outcome: Progressing Goal: Discharge plan remains appropriate-arrangements made Outcome: Progressing  Problem: Discharge Progression Outcomes Goal: Discharge plan in place and appropriate Outcome: Progressing Goal: Pain controlled with appropriate interventions Outcome: Progressing Goal: Hemodynamically stable Outcome: Progressing Goal: Complications resolved/controlled Outcome: Progressing Goal: Tolerating diet Outcome: Progressing Goal: Activity  appropriate for discharge plan Outcome: Progressing

## 2013-11-30 NOTE — Progress Notes (Signed)
Physical Therapy Treatment Patient Details Name: Andrea [name] MRN: [id number] DOB: 14-Aug-1950 Today's Date: 11/30/2013    History of Present Illness Adm 11/28/13 after fall getting off BSC ("it collapsed"). Pt had breast implant surgery 11/22/13 (after breast cancer) PMHx-breast cancer; epilepsy, DM    PT Comments    Pt moving slightly better today. Still very slowly and requiring vc for techniques (to minimize pain and/or for safety). Pt with poor memory of instructions from 11/9 and even forgetful of conversation we had during session (? Medicine effect). Noted her insurance would not approve a CIR stay, therefore feel SNF for continued therapies would be her best option with regards to therapy and incr safety. Pt prefers to d/c home and has now arranged for her sister to come stay with her (for at least a week, and longer if needed--per pt). Will follow   Follow Up Recommendations  SNF (not a CIR candidate due to her insurance)     Equipment Recommendations  None recommended by PT (OT to assess shower seat needs)    Recommendations for Other Services OT consult     Precautions / Restrictions Precautions Precautions: Fall    Mobility  Bed Mobility Overal bed mobility: Needs Assistance Bed Mobility: Rolling;Sidelying to Sit;Sit to Sidelying Rolling: Supervision (with rail) Sidelying to sit: HOB elevated;Min guard (20) Supine to sit: Mod assist   Sit to sidelying: Min assist General bed mobility comments: vc for technique; assist raising legs up onto bed; pt anxious and hurting, therefore HOB up and rail used with explanation that she will not have these at home. Discussed possibly sleeping in her recliner  Transfers Overall transfer level: Needs assistance Equipment used: Rolling walker (2 wheeled) Transfers: Sit to/from Stand Sit to Stand: Min guard         General transfer comment: to stand with bed rail; to sit with RW and vc for  technique  Ambulation/Gait Ambulation/Gait assistance: Min guard Ambulation Distance (Feet): 180 Feet Assistive device: Rolling walker (2 wheeled) (IV pole and then RW) Gait Pattern/deviations: Step-through pattern;Decreased stride length;Trunk flexed Gait velocity: very slow   General Gait Details: Agreeable to use of RW; vc for safe use (especially when turning)   Science writer    Modified Rankin (Stroke Patients Only)       Balance                                    Cognition Arousal/Alertness: Awake/alert Behavior During Therapy: Flat affect Overall Cognitive Status: Within Functional Limits for tasks assessed                      Exercises      General Comments        Pertinent Vitals/Pain SaO2 on 2L at rest 97%; on room air at rest 89-90% Walking on 2L 96%  Pain Assessment: 0-10 Pain Score: 10-Worst pain ever Pain Location: Rt ribs Pain Descriptors / Indicators: Burning Pain Intervention(s): Limited activity within patient's tolerance;Monitored during session;Patient requesting pain meds-RN notified;Repositioned    Home Living Family/patient expects to be discharged to:: Private residence Living Arrangements: Alone Available Help at Discharge: Family;Available PRN/intermittently (sons ([place] and [place] Type of Home: House (townhouse) Home Access: Stairs to enter Entrance Stairs-Rails: None Home Layout: Two level;Bed/bath upstairs;1/2 bath on main level Home Equipment: Walker - 2 wheels;Grab  bars - tub/shower;Hand held shower head;Bedside commode Additional Comments: Sisters came to stay with her after recent surgery x 2-3 days. They are not planning to return at this time. Plans to buy twin bed and put downstairs    Prior Function Level of Independence: Independent with assistive device(s)      Comments: would use RW in community; grab bars in shower   PT Goals (current goals can now  be found in the care plan section) Acute Rehab PT Goals Patient Stated Goal: be able to return home alone; staying on first floor Progress towards PT goals: Progressing toward goals    Frequency  Min 4X/week    PT Plan Discharge plan needs to be updated    Co-evaluation             End of Session Equipment Utilized During Treatment:  (no gait belt due to rib fractures) Activity Tolerance: Patient tolerated treatment well Patient left: with call bell/phone within reach;in bed;with bed alarm set (on [facility])     Time: [id number] PT Time Calculation (min) (ACUTE ONLY): 35 min  Charges:  $Gait Training: 8-22 mins $Therapeutic Activity: 8-22 mins                    G Codes:      Antanette Richwine Dec 08, 2013, 4:33 PM Pager [phone]

## 2013-11-30 NOTE — Progress Notes (Signed)
Report called to Baxter Flattery, RN on [employer].

## 2013-11-30 NOTE — Evaluation (Addendum)
Occupational Therapy Evaluation Patient Details Name: Andrea [name] MRN: [id number] DOB: Nov 20, 1950 Today's Date: 11/30/2013    History of Present Illness Adm 11/28/13 after fall getting off BSC ("it collapsed"). Pt had breast implant surgery 11/22/13 (after breast cancer) PMHx-breast cancer; epilepsy, DM   Clinical Impression   Pt s/p above. Pt independent with ADLs, PTA. Feel pt will benefit from acute OT to increase independence prior to d/c. Recommending SNF, but pt refusing. If pt continues to refuse, OT recommending HHOT and 24/7 supervision/assistance at d/c.     Follow Up Recommendations  SNF;Supervision/Assistance - 24 hour    Equipment Recommendations  3 in 1 bedside comode;Tub/shower seat;Other (comment) (AE)    Recommendations for Other Services       Precautions / Restrictions Precautions Precautions: Fall      Mobility Bed Mobility Overal bed mobility: Needs Assistance Bed Mobility: Supine to Sit     Supine to sit: Mod assist     General bed mobility comments: assist with trunk. Cues for rolling technique but pt performed more of a supine to sit technique.  Transfers Overall transfer level: Needs assistance Equipment used: Rolling walker (2 wheeled) Transfers: Sit to/from Stand Sit to Stand: Min guard         General transfer comment: cues for hand placement.    Balance                                            ADL Overall ADL's : Needs assistance/impaired     Grooming: Oral care;Brushing hair;Min guard;Standing           Upper Body Dressing : Moderate assistance;Sitting   Lower Body Dressing: Moderate assistance;Sit to/from stand;With adaptive equipment   Toilet Transfer: Min guard;Ambulation;RW (chair)           Functional mobility during ADLs: Min guard;Rolling walker General ADL Comments: Educated on AE for LB ADLs and pt practiced with reacher/sockaid. Spoke about shower chair and recommended shower  chair for LB bathing. Pt ambulated to sink and performed grooming tasks. Educated on breathing technique.  Discussed d/c plans and pt refusing SNF and OT explained that she would not want pt up alone right now. Told pt that button up shirts may be easier to manage. OT assisted pt in donning her bra. Educated on safety such as use of bag on walker.  Pt with decreased balance sitting EOB requiring Max A-did better when scooted to edge and feet on floor.     Vision                     Perception     Praxis      Pertinent Vitals/Pain Pain Assessment: 0-10 Pain Score: 9  Pain Location: rib area Pain Descriptors / Indicators: Burning Pain Intervention(s): Monitored during session   *O2 dropping in 80's during session-educated on deep breathing technique and trended up. O2 placed back on at end of session.     Hand Dominance Right   Extremity/Trunk Assessment Upper Extremity Assessment Upper Extremity Assessment: Overall WFL for tasks assessed   Lower Extremity Assessment Lower Extremity Assessment: Defer to PT evaluation       Communication Communication Communication: No difficulties   Cognition Arousal/Alertness: Awake/alert Behavior During Therapy: WFL for tasks assessed/performed Overall Cognitive Status: Within Functional Limits for tasks assessed  General Comments       Exercises       Shoulder Instructions      Home Living Family/patient expects to be discharged to:: Private residence Living Arrangements: Alone Available Help at Discharge: Family;Available PRN/intermittently (sons ([place] and [place])) Type of Home: House (townhouse) Home Access: Stairs to enter Technical brewer of Steps: 1 Entrance Stairs-Rails: None Home Layout: Two level;Bed/bath upstairs;1/2 bath on main level Alternate Level Stairs-Number of Steps: 12 (with landing after 6 steps) Alternate Level Stairs-Rails: Right (switches to left 1/2  way) Bathroom Shower/Tub: Tub/shower unit         Home Equipment: Walker - 2 wheels;Grab bars - tub/shower;Hand held shower head;Bedside commode   Additional Comments: Sisters came to stay with her after recent surgery x 2-3 days. They are not planning to return at this time. Plans to buy twin bed and put downstairs      Prior Functioning/Environment Level of Independence: Independent with assistive device(s)        Comments: would use RW in community; grab bars in shower    OT Diagnosis: Acute pain   OT Problem List: Decreased strength;Decreased range of motion;Decreased activity tolerance;Decreased knowledge of use of DME or AE;Decreased knowledge of precautions;Impaired balance (sitting and/or standing);Pain   OT Treatment/Interventions: Self-care/ADL training;DME and/or AE instruction;Therapeutic activities;Patient/family education;Balance training    OT Goals(Current goals can be found in the care plan section) Acute Rehab OT Goals Patient Stated Goal: go home OT Goal Formulation: With patient Time For Goal Achievement: 12/07/13 Potential to Achieve Goals: Good ADL Goals Pt Will Perform Lower Body Dressing: with set-up;with supervision;with adaptive equipment;sit to/from stand Pt Will Transfer to Toilet: with supervision;ambulating (3 in 1 over commode) Pt Will Perform Toileting - Clothing Manipulation and hygiene: sit to/from stand;with supervision Additional ADL Goal #1: Pt will perform bed mobility at [place] guard level as precursor for ADLs.  OT Frequency: Min 2X/week   Barriers to D/C: Decreased caregiver support          Co-evaluation              End of Session Equipment Utilized During Treatment: Gait belt;Rolling walker Nurse Communication: Mobility status;Other (comment) (O2)  Activity Tolerance: Patient tolerated treatment well Patient left: in chair;with call bell/phone within reach   Time: [id number] OT Time Calculation (min): 36 min Charges:   OT General Charges $OT Visit: 1 Procedure OT Evaluation $Initial OT Evaluation Tier I: 1 Procedure OT Treatments $Self Care/Home Management : 8-22 mins G-CodesBenito Mccreedy OTR/L [id number] 11/30/2013, 1:17 PM

## 2013-11-30 NOTE — Progress Notes (Signed)
Pt up in chair attempting to get back to bed without calling for assistance. Pt educated on importance of only getting up with help from staff. Chair alarm initiated and pt verbalizes understanding.

## 2013-12-01 LAB — GLUCOSE, CAPILLARY
GLUCOSE-CAPILLARY: 159 mg/dL — AB (ref 70–99)
Glucose-Capillary: 240 mg/dL — ABNORMAL HIGH (ref 70–99)
Glucose-Capillary: 127 mg/dL — ABNORMAL HIGH (ref 70–99)
Glucose-Capillary: 152 mg/dL — ABNORMAL HIGH (ref 70–99)

## 2013-12-01 MED ORDER — CITALOPRAM HYDROBROMIDE 20 MG PO TABS
20.0000 mg | ORAL_TABLET | Freq: Every day | ORAL | Status: DC
  Administered 2013-12-01 – 2013-12-06 (×6): 20 mg via ORAL
  Filled 2013-12-01 (×6): qty 1

## 2013-12-01 NOTE — Progress Notes (Signed)
[facility] Trauma [facility]  Progress Note   LOS: 3 days   Subjective: Pt with pain with breathing.  Pain in chest.  No N/V tolerated food this am.  Working with PT/OT but weak and some what forgetful, delayed to answer, wording finding issues.     Objective: Vital signs in last 24 hours: Temp:  [98.1 F (36.7 C)-99.1 F (37.3 C)] 98.1 F (36.7 C) (11/11 0545) Pulse Rate:  [104-113] 107 (11/11 0545) Resp:  [18-20] 18 (11/11 0545) BP: (110-150)/(57-66) 123/66 mmHg (11/11 0545) SpO2:  [93 %-98 %] 98 % (11/11 0545) Last BM Date:  (PTA; unknown)  Lab Results:  CBC  Recent Labs  11/29/13 0230 11/30/13 0330  WBC 9.2 7.9  HGB 11.0* 10.2*  HCT 32.4* 29.9*  PLT 306 294   BMET  Recent Labs  11/29/13 0230 11/30/13 0330  NA 138 137  K 4.5 4.7  CL 101 99  CO2 24 26  GLUCOSE 201* 152*  BUN 22 21  CREATININE 1.01 0.85  CALCIUM 8.9 8.8    Imaging: Dg Chest Port 1 View  11/30/2013   CLINICAL DATA:  Right rib fractures.  EXAM: PORTABLE CHEST - 1 VIEW  COMPARISON:  11/29/2013.  FINDINGS: Port-A-Cath in stable position. Mediastinum and hilar structures are unremarkable. Stable cardiomegaly. Pulmonary vascularity normal. Improving bibasilar subsegmental atelectasis. No pleural effusion or pneumothorax. Right rib fractures are better demonstrated on prior CT 11/28/2013.  IMPRESSION: 1. Port-A-Cath in stable position. 2. Improving bibasilar subsegmental atelectasis. 3. Stable cardiomegaly, no pulmonary venous congestion. 4. Right rib fractures are better demonstrated on prior CT of 11/28/2013. No pneumothorax.   Electronically Signed   By: Marcello Moores  Register   On: 11/30/2013 07:35     PE: General: pleasant, WD/WN white female who is laying in bed in NAD Heart: regular, rate, and rhythm.  Normal s1,s2. No obvious murmurs, gallops, or rubs noted.  Palpable radial and pedal pulses bilaterally Breast:  B/l incisions C/D/I with steri-strips in place, tender to  palpation Lungs: CTAB, no wheezes, rhonchi, or rales noted.  IS to 1000.  Respiratory effort nonlabored, chest wall tenderness. Abd: soft, NT/ND, +BS, no masses, hernias, or organomegaly MS: all 4 extremities are symmetrical with no cyanosis, clubbing, or edema. Skin: warm and dry with no masses, lesions, or rashes Psych: A&Ox3 with an appropriate affect.   Assessment/Plan: Fall R rib FX 4-9 - pulmonary toilet BrCA w/ Recent B/L breast reconstruction - incisions look great PT/OT  DM - SSI and increase Lantus FEN - SL IV, encourage orals for pain, resume celexa Dispo - On floor, lives alone but son is arranging bed downstairs for her, ?sister may be able to stay with her, unsure if she will progress enough to be safe at home or if she'll have to go to SNF.  The patient refusing SNF at this time.   Coralie Keens, PA-C Pager: [phone] General Trauma PA Pager: [phone]   12/01/2013

## 2013-12-01 NOTE — Clinical Social Work Note (Signed)
CSW met with pt at bedside regarding SNF placement. Pt informed CSW pt will be discharging home with the assistance of pt's sister Andrea [name]) and [name]. CSW inquired about pt's willingness to consider SNF options with PT recommendation of SNF placement. Pt stated pt will not be discharging to SNF and will not consider reviewing possible SNF bed offers. Pt informed CSW pt has previously utilized home health services through [facility] and would prefer to utilize their services at time of discharge.  RNCM and PA updated regarding information above. CSW signing off.  Andrea [name], [place] [phone]) Licensed Clinical Social Worker Orthopedics [phone]) and Surgical [phone])
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
## 2013-12-02 DIAGNOSIS — IMO0001 Reserved for inherently not codable concepts without codable children: Secondary | ICD-10-CM | POA: Insufficient documentation

## 2013-12-02 DIAGNOSIS — Z794 Long term (current) use of insulin: Secondary | ICD-10-CM

## 2013-12-02 DIAGNOSIS — E119 Type 2 diabetes mellitus without complications: Secondary | ICD-10-CM

## 2013-12-02 LAB — GLUCOSE, CAPILLARY
GLUCOSE-CAPILLARY: 140 mg/dL — AB (ref 70–99)
GLUCOSE-CAPILLARY: 143 mg/dL — AB (ref 70–99)
GLUCOSE-CAPILLARY: 121 mg/dL — AB (ref 70–99)
Glucose-Capillary: 121 mg/dL — ABNORMAL HIGH (ref 70–99)
Glucose-Capillary: 134 mg/dL — ABNORMAL HIGH (ref 70–99)
Glucose-Capillary: 117 mg/dL — ABNORMAL HIGH (ref 70–99)

## 2013-12-02 LAB — URINALYSIS, ROUTINE W REFLEX MICROSCOPIC
Bilirubin Urine: NEGATIVE
Glucose, UA: NEGATIVE mg/dL
Ketones, ur: 15 mg/dL — AB
NITRITE: NEGATIVE
PH: 6 (ref 5.0–8.0)
Protein, ur: 100 mg/dL — AB
Specific Gravity, Urine: 1.019 (ref 1.005–1.030)
Urobilinogen, UA: 1 mg/dL (ref 0.0–1.0)

## 2013-12-02 LAB — URINE MICROSCOPIC-ADD ON

## 2013-12-02 MED ORDER — TRAMADOL HCL 50 MG PO TABS
100.0000 mg | ORAL_TABLET | Freq: Four times a day (QID) | ORAL | Status: DC
Start: 2013-12-02 — End: 2013-12-03
  Administered 2013-12-02 – 2013-12-03 (×4): 100 mg via ORAL
  Filled 2013-12-02 (×4): qty 2

## 2013-12-02 MED ORDER — HYDROMORPHONE HCL 1 MG/ML IJ SOLN: 0.5000 mg | INTRAMUSCULAR | Status: DC | PRN

## 2013-12-02 MED ORDER — OXYCODONE HCL 5 MG PO TABS
5.0000 mg | ORAL_TABLET | ORAL | Status: DC | PRN
  Administered 2013-12-02 (×2): 15 mg via ORAL
  Administered 2013-12-03: 10 mg via ORAL
  Filled 2013-12-02 (×2): qty 3
  Filled 2013-12-02: qty 2

## 2013-12-02 MED ORDER — PANTOPRAZOLE SODIUM 40 MG PO TBEC
40.0000 mg | DELAYED_RELEASE_TABLET | Freq: Every day | ORAL | Status: DC
  Administered 2013-12-02 – 2013-12-06 (×5): 40 mg via ORAL
  Filled 2013-12-02 (×4): qty 1

## 2013-12-02 NOTE — Progress Notes (Signed)
UR completed.  Call placed to pt's son at 1345 today to discuss her baseline neuro status as compared to her level of confusion today as well as the actual plan for assistance at pt's discharge. He has not yet returned the call.   Sandi Mariscal, RN BSN [place] CCM Trauma/Neuro ICU Case Manager [phone]

## 2013-12-02 NOTE — Progress Notes (Signed)
Patient ID: Andrea [name], female   DOB: 05-May-1950, 63 y.o.   MRN: [id number]   LOS: 4 days   Subjective: No new c/o. Has arranged 24h supervision through her son and her ex-husband's wife.   Objective: Vital signs in last 24 hours: Temp:  [98.2 F (36.8 C)-98.3 F (36.8 C)] 98.3 F (36.8 C) (11/12 0530) Pulse Rate:  [101-111] 101 (11/12 0530) Resp:  [18-20] 20 (11/12 0530) BP: (136-151)/(57-69) 136/57 mmHg (11/12 0530) SpO2:  [95 %] 95 % (11/12 0530) Last BM Date:  (PTA; unknown)   Laboratory Results CBG (last 3)   Recent Labs  12/01/13 2151 12/02/13 0236 12/02/13 0804  GLUCAP 152* 121* 140*    Physical Exam General appearance: alert, no distress and slowed mentation Resp: clear to auscultation bilaterally Cardio: Mild tachycardia GI: normal findings: bowel sounds normal and soft, non-tender   Assessment/Plan: Fall R rib FX 4-9 - pulmonary toilet BrCA w/ Recent B/L breast reconstruction - incisions look great DM - SSI and increase Lantus FEN - I suspect MS issues stem from narcotics. Will add scheduled tramadol, decrease IV dilaudid. D/C foley. VTE -- SCD's, Lovenox Dispo - Likely d/c this afternoon.    Lisette Abu, PA-C Pager: [phone] General Trauma PA Pager: [phone]  12/02/2013

## 2013-12-02 NOTE — Progress Notes (Signed)
Occupational Therapy Treatment Patient Details Name: Andrea [name] MRN: [id number] DOB: 05/19/1950 Today's Date: 12/02/2013    History of present illness Adm 11/28/13 after fall getting off BSC ("it collapsed"). Pt had breast implant surgery 11/22/13 (after breast cancer). 12/02/13 incr confusion (?med related per Trauma) PMHx-breast cancer; epilepsy, DM   OT comments  Focus of session on ADL and ADL transfers.  Pt able to perform bed mobility to EOB with supervision, min assist for LEs to return to supine.  Pt requiring close supervision for transfers and ambulation with RW.  Pt able to donn socks without assist.  Pt with slow processing speed, disorientation to time and situation, and decreased problem solving when performing transfers.  Some perseverative behavior also noted.  Trauma PA aware, suspected due to medication.  Follow Up Recommendations  SNF;Supervision/Assistance - 24 hour (pt refusing, will need HHOT if going home)   Equipment Recommendations  3 in 1 bedside comode;Tub/shower seat    Recommendations for Other Services      Precautions / Restrictions Precautions Precautions: Fall       Mobility Bed Mobility Overal bed mobility: Needs Assistance Bed Mobility: Supine to Sit     Sit to sidelying: Min assist General bed mobility comments: assist for LEs up in bed, managed bed linens independently  Transfers Overall transfer level: Needs assistance Equipment used: Rolling walker (2 wheeled) Transfers: Stand Pivot Transfers;Sit to/from Stand Sit to Stand: Min guard Stand pivot transfers: Min guard       General transfer comment: Pt performing 180 degree turn rather than stand pivot, poor problem solving and planning.    Balance Overall balance assessment: Needs assistance         Standing balance support: No upper extremity supported;During functional activity Standing balance-Leahy Scale: Poor Standing balance comment: at sink x 8 minutes with  anterior losses of balance                   ADL Overall ADL's : Needs assistance/impaired     Grooming: Wash/dry hands;Minimal assistance;Standing Grooming Details (indicate cue type and reason): assist to sequence and to stop activity when handwashing became excessive             Lower Body Dressing: Min guard;Sit to/from stand Lower Body Dressing Details (indicate cue type and reason): donned socks crossing foot over opposite knee, with extra time Toilet Transfer: Min Public librarian Details (indicate cue type and reason): pt urinary urgency so used BSC Toileting- Clothing Manipulation and Hygiene: Minimal assistance;Sit to/from stand Toileting - Clothing Manipulation Details (indicate cue type and reason): assist for gown, set up for pericare in sitting     Functional mobility during ADLs: Min guard;Rolling walker General ADL Comments: AE no longer needed for LB ADL      Vision                     Perception     Praxis      Cognition   Behavior During Therapy: Flat affect Overall Cognitive Status: Impaired/Different from baseline Area of Impairment: Safety/judgement;Problem solving;Attention   Current Attention Level: Sustained Memory: Decreased short-term memory  Following Commands: Follows one step commands with increased time Safety/Judgement: Decreased awareness of safety Awareness: Intellectual Problem Solving: Slow processing;Difficulty sequencing;Requires verbal cues;Requires tactile cues General Comments: pt able to recall use of calendar when orientation questions asked at beginning and end of session    Extremity/Trunk Assessment  Exercises     Shoulder Instructions       General Comments      Pertinent Vitals/ Pain       Pain Assessment: Faces Pain Score:  (unable to rate) Faces Pain Scale: Hurts little more Pain Location: ribs Pain Intervention(s): Monitored during  session;Repositioned  Home Living Family/patient expects to be discharged to:: Private residence Living Arrangements: Non-relatives/Friends (ex-husband, his wife) Available Help at Discharge: Family;Available 24 hours/day Type of Home: House (ex-husband's home) Home Access: Level entry     Home Layout: One level               Home Equipment: Walker - 2 wheels;Hand held shower head;Bedside commode          Prior Functioning/Environment              Frequency Min 2X/week     Progress Toward Goals  OT Goals(current goals can now be found in the care plan section)  Progress towards OT goals: Progressing toward goals  Acute Rehab OT Goals Patient Stated Goal: go to stay with her ex-husband and wife  Plan Discharge plan remains appropriate    Co-evaluation                 End of Session Equipment Utilized During Treatment: Gait belt;Rolling walker   Activity Tolerance Patient tolerated treatment well   Patient Left in bed;with call bell/phone within reach;with bed alarm set   Nurse Communication          Time: [id number] OT Time Calculation (min): 34 min  Charges: OT General Charges $OT Visit: 1 Procedure OT Treatments $Self Care/Home Management : 23-37 mins  Malka So 12/02/2013, 2:45 PM  [phone]

## 2013-12-02 NOTE — Progress Notes (Signed)
Physical Therapy Treatment Patient Details Name: Andrea [name] MRN: [id number] DOB: 03-07-1950 Today's Date: 12/02/2013    History of Present Illness Adm 11/28/13 after fall getting off BSC ("it collapsed"). Pt had breast implant surgery 11/22/13 (after breast cancer). 12/02/13 incr confusion (?med related per Trauma) PMHx-breast cancer; epilepsy, DM    PT Comments    Pt MUCH more confused this date (see cognition below). Discussed with Trauma PA and RN. Noted SW note stating pt has refused SNF and pt reports plans to go stay with her ex-husband and his wife. Currently pt requires 24/7 hands-on assist due to confusion and decr balance. Pt incontinent of urine and denies that this was an issue PTA. Continue to recommend SNF as best d/c plan if family cannot provide current level of care.    Follow Up Recommendations  SNF (pt refusing SNF; plans to go home with family)     Equipment Recommendations  None recommended by PT    Recommendations for Other Services       Precautions / Restrictions Precautions Precautions: Fall    Mobility  Bed Mobility Overal bed mobility: Needs Assistance Bed Mobility: Rolling;Sidelying to Sit Rolling: Modified independent (Device/Increase time) (she used nightstand to pull on) Sidelying to sit: Supervision       General bed mobility comments: she did not remove her covers prior to sitting up and became entangled with them; she did recognize this prior to standing and with incr time was able to remove them from her legs  Transfers Overall transfer level: Needs assistance Equipment used: Rolling walker (2 wheeled) Transfers: Sit to/from Stand Sit to Stand: Min guard         General transfer comment: vc for safe use of RW/hand placement; x 2  Ambulation/Gait Ambulation/Gait assistance: Min assist Ambulation Distance (Feet): 15 Feet (toileted, then 15 ft) Assistive device: Rolling walker (2 wheeled) (IV pole and then RW) Gait  Pattern/deviations: Step-through pattern;Decreased stride length;Trunk flexed;Wide base of support Gait velocity: very slow Gait velocity interpretation: <1.8 ft/sec, indicative of risk for recurrent falls General Gait Details: See cognitive description re: safety issues and problems maneuvering RW   Stairs            Wheelchair Mobility    Modified Rankin (Stroke Patients Only)       Balance Overall balance assessment: Needs assistance         Standing balance support: No upper extremity supported;During functional activity Standing balance-Leahy Scale: Poor Standing balance comment: at sink x 8 minutes with anterior losses of balance                    Cognition Arousal/Alertness: Lethargic;Suspect due to medications Behavior During Therapy: Flat affect Overall Cognitive Status: Impaired/Different from baseline Area of Impairment: Attention;Memory;Following commands;Safety/judgement;Awareness;Problem solving   Current Attention Level: Focused Memory: Decreased short-term memory Following Commands: Follows one step commands with increased time Safety/Judgement: Decreased awareness of safety Awareness: Intellectual Problem Solving: Slow processing;Difficulty sequencing;Requires verbal cues;Requires tactile cues General Comments: pt very slow to answer questions; reported set up of ex-husband's home and within 2 minutes reported completely different information (and did not recall that we had JUST discussed and provided different information); unable to problem solve how to open bathroom door with RW (even once PT aligned her appropriately and placed her Lt hand on doorknob, she partially opened door and began ramming RW into the door and could not figure out how to get through/around door); she misaligned herself with toilet (walked 2 feet  to the Rt of it, then backed up sitting on it sideways/diagonally AFTER becoming incontinent of urine); told to walk to sink to  wash hands and <30 sec later was attempting to walk out the door and didn't recall what she was supposed to do; perseverated with hand-washing (8 minutes) and forgot to turn off sink and began walking away--even when questioned what she forgot she could not initially identify that she needed to turn off the sink    Exercises      General Comments General comments (skin integrity, edema, etc.): Spoke with Orion Crook, PA re: significant decline in cognition      Pertinent Vitals/Pain Pain Assessment: Faces Pain Score:  (unable to rate) Faces Pain Scale: Hurts little more Pain Location: holds Rt ribs Pain Intervention(s): Limited activity within patient's tolerance;Monitored during session;Repositioned    Home Living Family/patient expects to be discharged to:: Private residence Living Arrangements: Non-relatives/Friends (ex-husband, his wife) Available Help at Discharge: Family;Available 24 hours/day Type of Home: House (ex-husband's home) Home Access: Level entry   Home Layout: One level Home Equipment: Walker - 2 wheels;Hand held shower head;Bedside commode      Prior Function            PT Goals (current goals can now be found in the care plan section) Acute Rehab PT Goals Patient Stated Goal: go to stay with her ex-husband and wife Progress towards PT goals: Not progressing toward goals - comment (decr cognition with decr safety and balance)    Frequency  Min 4X/week    PT Plan Current plan remains appropriate    Co-evaluation             End of Session Equipment Utilized During Treatment: Gait belt Activity Tolerance: Patient limited by lethargy Patient left: with call bell/phone within reach;in chair;with chair alarm set     Time: 1121-1155 PT Time Calculation (min) (ACUTE ONLY): 34 min  Charges:  $Gait Training: 8-22 mins $Self Care/Home Management: 8-22                    G Codes:      Derold Dorsch 12/03/2013, 12:19 PM Pager [phone]

## 2013-12-03 ENCOUNTER — Inpatient Hospital Stay (HOSPITAL_COMMUNITY): Payer: Medicare PPO

## 2013-12-03 LAB — GLUCOSE, CAPILLARY
GLUCOSE-CAPILLARY: 137 mg/dL — AB (ref 70–99)
GLUCOSE-CAPILLARY: 101 mg/dL — AB (ref 70–99)
GLUCOSE-CAPILLARY: 108 mg/dL — AB (ref 70–99)
Glucose-Capillary: 117 mg/dL — ABNORMAL HIGH (ref 70–99)

## 2013-12-03 LAB — VALPROIC ACID LEVEL: VALPROIC ACID LVL: 86 ug/mL (ref 50.0–100.0)

## 2013-12-03 MED ORDER — HYDROCODONE-ACETAMINOPHEN 10-325 MG PO TABS: 0.5000 | ORAL_TABLET | ORAL | Status: DC | PRN

## 2013-12-03 MED ORDER — TRAMADOL HCL 50 MG PO TABS
50.0000 mg | ORAL_TABLET | Freq: Four times a day (QID) | ORAL | Status: DC | PRN
  Administered 2013-12-05 – 2013-12-06 (×4): 50 mg via ORAL
  Filled 2013-12-03 (×4): qty 1

## 2013-12-03 MED ORDER — TRAMADOL HCL 50 MG PO TABS: 100.0000 mg | ORAL_TABLET | Freq: Four times a day (QID) | ORAL | Status: DC

## 2013-12-03 MED ORDER — SULFAMETHOXAZOLE-TRIMETHOPRIM 800-160 MG PO TABS
1.0000 | ORAL_TABLET | Freq: Two times a day (BID) | ORAL | Status: DC
  Administered 2013-12-03 – 2013-12-06 (×7): 1 via ORAL
  Filled 2013-12-03 (×9): qty 1

## 2013-12-03 MED ORDER — SULFAMETHOXAZOLE-TRIMETHOPRIM 800-160 MG PO TABS: 1.0000 | ORAL_TABLET | Freq: Two times a day (BID) | ORAL | Status: DC

## 2013-12-03 NOTE — Progress Notes (Signed)
Patient ID: Andrea [name], female   DOB: [birth year], 63 y.o.   MRN: [id number]   LOS: 5 days   Subjective: No new c/o. MS still with slowed mentation but Ox3 this am. Admits to dysuria.   Objective: Vital signs in last 24 hours: Temp:  [97.5 F (36.4 C)-98.2 F (36.8 C)] 97.5 F (36.4 C) (11/13 0624) Pulse Rate:  [101-108] 105 (11/13 0624) Resp:  [18-20] 18 (11/13 0624) BP: (110-148)/(68-74) 110/74 mmHg (11/13 0624) SpO2:  [95 %-98 %] 95 % (11/13 0624) Last BM Date:  (pta)   IS: 1226m   Laboratory  Urinalysis    Component Value Date/Time   COLORURINE AMBER* 12/02/2013 1612   APPEARANCEUR TURBID* 12/02/2013 1612   LABSPEC 1.019 12/02/2013 1612   PHURINE 6.0 12/02/2013 1612   GLUCOSEU NEGATIVE 12/02/2013 1612   HGBUR LARGE* 12/02/2013 1612   BILIRUBINUR NEGATIVE 12/02/2013 1612   KETONESUR 15* 12/02/2013 1612   PROTEINUR 100* 12/02/2013 1612   UROBILINOGEN 1.0 12/02/2013 1612   NITRITE NEGATIVE 12/02/2013 1612   LEUKOCYTESUR SMALL* 12/02/2013 1612    Physical Exam General appearance: alert and no distress Resp: clear to auscultation bilaterally Cardio: regular rate and rhythm GI: normal findings: bowel sounds normal and soft, non-tender   Assessment/Plan: Fall R rib FX 4-9 - pulmonary toilet UTI -- Treat with Septra DS BrCA w/ Recent B/L breast reconstruction - incisions look great DM  Dispo - D/C today    MLisette Abu PA-C Pager: [phone] Trauma PA Pager: [phone] 12/03/2013

## 2013-12-03 NOTE — Discharge Summary (Signed)
Physician Discharge Summary  Patient ID: Andrea [name] MRN: [id number] DOB/AGE: May 04, 1950 63 y.o.  Admit date: 11/28/2013 Discharge date: 12/03/2013  Discharge Diagnoses Patient Active Problem List   Diagnosis Date Noted  . UTI (urinary tract infection) 12/03/2013  . Fall 12/02/2013  . Hypertension   . Breast cancer   . Diabetes mellitus without complication   . Migraine   . Hypothyroid   . Epilepsy   . Esophageal reflux   . Multiple rib fractures 11/28/2013    Consultants None   Procedures None   HPI: Andrea [name] presented after a fall that occurred several days prior to admission. She is a breast cancer patient and underwent exchange of her tissue expanders to formal implants bilaterally at [facility] the week prior. She fell off of her bedside commode several days ago. She presented to [facility] with right-sided chest pain. Apparently a complete workup including CAT scans were unremarkable so she was discharged. She presented to [facility] a few days later because of increasing pain. A CT scan of her chest showed multiple rib fractures. She was admitted for pain control and pulmonary toilet.   Hospital Course: The patient's pain was initially controlled with a PCA and then she was transitioned to oral medications. She was mobilized with physical and occupational therapies who recommended skilled nursing facility placement but the patient insisted on going home. She began to have some confusion later on in her hospital course and this was likely due to a combination of narcotics and a mild urinary tract infection. These were both addressed and she was much improved on the day of discharge. She had arranged for 24-hour supervision with her family and she was discharged home in stable condition.      Medication List    STOP taking these medications        cyclobenzaprine 10 MG tablet  Commonly known as:  FLEXERIL      TAKE these medications        ALPRAZolam 0.5 MG tablet  Commonly known as:  XANAX  Take 0.5 mg by mouth 4 (four) times daily.     atenolol 50 MG tablet  Commonly known as:  TENORMIN  Take 50 mg by mouth daily.     citalopram 20 MG tablet  Commonly known as:  CELEXA  Take 20 mg by mouth daily.     clotrimazole-betamethasone cream  Commonly known as:  LOTRISONE  Apply 1 application topically 2 (two) times daily.     divalproex 500 MG DR tablet  Commonly known as:  DEPAKOTE  Take 500 mg by mouth 3 (three) times daily.     gabapentin 300 MG capsule  Commonly known as:  NEURONTIN  Take 300 mg by mouth 3 (three) times daily.     glucose blood test strip  1 each by Other route 3 (three) times daily. Use as instructed     HYDROcodone-acetaminophen 7.5-325 MG per tablet  Commonly known as:  NORCO  Take 1 tablet by mouth every 6 (six) hours as needed for moderate pain.     HYDROmorphone 2 MG tablet  Commonly known as:  DILAUDID  Take 2 mg by mouth every 4 (four) hours as needed for severe pain.     insulin aspart 100 UNIT/ML injection  Commonly known as:  novoLOG  Inject 4-12 Units into the skin 3 (three) times daily before meals. Per sliding scale. For every 100 or >, uses 2 additional units.     insulin glargine  100 UNIT/ML injection  Commonly known as:  LANTUS  Inject 30 Units into the skin daily.     levothyroxine 200 MCG tablet  Commonly known as:  SYNTHROID, LEVOTHROID  Take 400 mcg by mouth daily before breakfast.     lidocaine-prilocaine cream  Commonly known as:  EMLA  Apply 1 application topically as needed.     lisinopril 10 MG tablet  Commonly known as:  PRINIVIL,ZESTRIL  Take 10 mg by mouth daily.     loperamide 2 MG capsule  Commonly known as:  IMODIUM  Take by mouth as needed for diarrhea or loose stools.     niacin 250 MG tablet  Take 250 mg by mouth at bedtime.     ondansetron 4 MG disintegrating tablet  Commonly known as:  ZOFRAN-ODT  Take 4 mg by mouth every 8 (eight) hours  as needed for nausea or vomiting.     sulfamethoxazole-trimethoprim 800-160 MG per tablet  Commonly known as:  BACTRIM DS,SEPTRA DS  Take 1 tablet by mouth every 12 (twelve) hours.     traMADol 50 MG tablet  Commonly known as:  ULTRAM  Take 2 tablets (100 mg total) by mouth every 6 (six) hours.     zolpidem 10 MG tablet  Commonly known as:  AMBIEN  Take 10 mg by mouth at bedtime as needed for sleep.             Follow-up Information    Follow up with Vickii Chafe, MD.   Specialty:  Family Medicine      Follow up with [facility].   Why:  As needed   Contact information:   [address] [place] [address] [phone]        Signed: Lisette Abu, PA-C Pager: [phone] General Trauma PA Pager: [phone] 12/03/2013, 8:28 AM

## 2013-12-03 NOTE — Progress Notes (Signed)
Physical Therapy Treatment Patient Details Name: Andrea [name] MRN: [id number] DOB: 1950/04/24 Today's Date: 12/03/2013    History of Present Illness Adm 11/28/13 after fall getting off BSC ("it collapsed"). Pt had breast implant surgery 11/22/13 (after breast cancer). 12/02/13 incr confusion (?med related per Trauma) PMHx-breast cancer; epilepsy, DM    PT Comments    Patient very minimally participating due to lethargy and slow responses to assist for prep to mobilize this pm.  Feel medication related or due to meds backing up in her system as RN states she hasn't had much for pain today.  Will need SNF level rehab at d/c.  Follow Up Recommendations  SNF     Equipment Recommendations  None recommended by PT    Recommendations for Other Services       Precautions / Restrictions Precautions Precautions: Fall Restrictions Weight Bearing Restrictions: No    Mobility  Bed Mobility Overal bed mobility: Needs Assistance Bed Mobility: Sit to Supine       Sit to supine: +2 for physical assistance;Mod assist   General bed mobility comments: RN assisted for trunk and PT with LE's  Transfers Overall transfer level: Needs assistance   Transfers: Sit to/from Stand Sit to Stand: Max assist Stand pivot transfers: Mod assist       General transfer comment: heavy assist to stand with knees blocked and assist for anterior weight shift; initially with hips flexed, then after assisted into extension stood with slightly less support; return to sitting after about 10 seconds, then assisted to transfer to bed with some improved participation able to shuffle feet to turn to sit on bed  Ambulation/Gait                 Stairs            Wheelchair Mobility    Modified Rankin (Stroke Patients Only)       Balance             Standing balance-Leahy Scale: Zero Standing balance comment: very slow to gain upright postural control this pm                     Cognition Arousal/Alertness: Lethargic;Suspect due to medications Behavior During Therapy: Flat affect Overall Cognitive Status: Impaired/Different from baseline         Following Commands: Follows one step commands with increased time;Follows one step commands inconsistently Safety/Judgement: Decreased awareness of deficits   Problem Solving: Decreased initiation;Requires tactile cues;Requires verbal cues;Slow processing General Comments: Patient solemnent with food tray in front of her untouched at 3:15 in the afternoon.  She aroused slowly and seemingly in pain, though stayed very groggy and barely interactive enough to stand or transfer during session    Exercises      General Comments        Pertinent Vitals/Pain Faces Pain Scale: Hurts even more Pain Location: ribs Pain Intervention(s): Patient requesting pain meds-RN notified    Home Living                      Prior Function            PT Goals (current goals can now be found in the care plan section) Progress towards PT goals: Not progressing toward goals - comment (lethargy and poor initiation)    Frequency  Min 4X/week    PT Plan Current plan remains appropriate    Co-evaluation  End of Session Equipment Utilized During Treatment: Gait belt Activity Tolerance: Patient limited by fatigue;Patient limited by lethargy Patient left: in bed;with call bell/phone within reach;with bed alarm set     Time: [id number] PT Time Calculation (min) (ACUTE ONLY): 19 min  Charges:  $Therapeutic Activity: 8-22 mins                    G Codes:      WYNN,CYNDI 12-21-13, 9:53 PM Magda Kiel, [place] 2013-12-21

## 2013-12-03 NOTE — Progress Notes (Signed)
In attempt to arrange DME for patient for d/c, her orders were faxed to [facility] to fulfill.  When speaking with the patient about getting a ride home from the hospital, she was extremely difficult to keep awake for even single questions. Patient had stated that her plan was to d/c home with ex-husband and his current wife.  When I asked her his name to call him as he is not listed in our system as a contact, she could not tell me his first name.  It was found via [employer], [employer] and [employer].  His phone and his current wife's phone numbers are unlisted.   I again attempted to call the patient's son, Gerald Stabs, from a non-hospital phone, I was able to reach him.  He confirmed that his stepmother had initially agreed to take the patient home but that was because the patient had not told her that she would require 24-hour care. All of the patient's family members (2 sons, ex-husband) continue to work and can't provide the necessary supervision and assistance.  Her sister, who had previously been given POA by the patient (we have no record or proof of this), has stated to the patient's son that she no longer wishes to have this responsibility as she is not healthy herself.  Patient's son, Jillene Bucks, feels that the safest place for her is in a SNF.  He prefers the [place] area as that is where he lives.  He has a brother in [place] but states he is mostly uninvolved and that is why Gerald Stabs moved the patient to [place] so that he could watch after her.  He works Scientist, research (medical) and has a child so is unable to provide any supervision to the patient.   The process of a CSW sending out an FL2 and obtaining offers and then presenting them for a choice was explained and he was agreeable to that process being started.  Assigned CSW, Raquel Sarna, was contacted and advised of the need to start this process.  She was hopeful to get patient placed this weekend if at all possible.  DME order for  the patient has been cancelled.  Medicare IM (Important Message) delivered to patient today by me in anticipation of discharge.    Sandi Mariscal, RN BSN MHA CCM  Case Manager, Trauma Service/Unit 40M [phone]   Assigned RN on the unit is aware that the patient will not be discharging until a SNF bed is secured for the patient.

## 2013-12-03 NOTE — Progress Notes (Signed)
Andrea [name]'s mental acuity continues to wax and wane over the course of the day. After speaking with her son by phone it seems this is likely baseline for her. As an example, her discharge plan as communicated by her was not true. As such, she clearly lacks capacity to make decisions in her own best interest. We will complete workup and treatment for organic reasons for her altered mental status but attempt SNF placement for now.    Lisette Abu, PA-C Pager: [phone] General Trauma PA Pager: [phone]

## 2013-12-04 LAB — GLUCOSE, CAPILLARY
GLUCOSE-CAPILLARY: 86 mg/dL (ref 70–99)
Glucose-Capillary: 129 mg/dL — ABNORMAL HIGH (ref 70–99)
Glucose-Capillary: 114 mg/dL — ABNORMAL HIGH (ref 70–99)
Glucose-Capillary: 128 mg/dL — ABNORMAL HIGH (ref 70–99)

## 2013-12-04 NOTE — Clinical Social Work Placement (Signed)
Clinical Social Work Department CLINICAL SOCIAL WORK PLACEMENT NOTE 12/04/2013  Patient:  CHRISTIANA, BACIGALUPO  Account Number:  [id number] Admit date:  11/28/2013  Clinical Social Worker:  Delrae Sawyers  Date/time:  12/04/2013 03:39 PM  Clinical Social Work is seeking post-discharge placement for this patient at the following level of care:   [place]   (*CSW will update this form in Epic as items are completed)   12/04/2013  Patient/family provided with [place] Department of Clinical Social Work's list of facilities offering this level of care within the geographic area requested by the patient (or if unable, by the patient's family).  12/04/2013  Patient/family informed of their freedom to choose among providers that offer the needed level of care, that participate in Medicare, Medicaid or managed care program needed by the patient, have an available bed and are willing to accept the patient.  12/04/2013  Patient/family informed of [facility]' ownership interest in [facility], as well as of the fact that they are under no obligation to receive care at this facility.  PASARR submitted to EDS on 12/04/2013 PASARR number received on 12/04/2013  FL2 transmitted to all facilities in geographic area requested by pt/family on  12/04/2013 FL2 transmitted to all facilities within larger geographic area on   Patient informed that his/her managed care company has contracts with or will negotiate with  certain facilities, including the following:     Patient/family informed of bed offers received:   Patient chooses bed at  Physician recommends and patient chooses bed at    Patient to be transferred to  on   Patient to be transferred to facility by  Patient and family notified of transfer on  Name of family member notified:    The following physician request were entered in Epic:   Additional Comments:  Henderson [place] ([id number]) Licensed Clinical  Social Worker Orthopedics [phone]) and Surgical [phone])

## 2013-12-04 NOTE — Progress Notes (Signed)
  Subjective: Stable and alert. Pleasantly confused. Poor historian. CT head yesterday shows advanced atrophy and microvascular ischemic disease but no acute intracranial process. Case management has seen patient and discussed with son, who feels safest place for her is in a SNF. They are beginning the process of obtaining offers. She will therefore need to stay in the hospital this weekend until placement is arranged.  She states that her pain is under good control. She denies pulmonary problems.   Objective: Vital signs in last 24 hours: Temp:  [97.6 F (36.4 C)-98.6 F (37 C)] 98.6 F (37 C) (11/14 0518) Pulse Rate:  [94-103] 94 (11/14 0518) Resp:  [15-17] 17 (11/14 0518) BP: (114-120)/(69-72) 120/69 mmHg (11/14 0518) SpO2:  [94 %-97 %] 94 % (11/14 0518) Last BM Date:  (pta)  Intake/Output from previous day: 11/13 0701 - 11/14 0700 In: 240 [P.O.:240] Out: -  Intake/Output this shift:    General appearance: Alert. Cooperative. Pleasantly confused. Poor historian. Resp: clear to auscultation bilaterally GI: abdomen soft and nontender. Not distended.  Lab Results:  No results for input(s): WBC, HGB, HCT, PLT in the last 72 hours. BMET No results for input(s): NA, K, CL, CO2, GLUCOSE, BUN, CREATININE, CALCIUM in the last 72 hours. PT/INR No results for input(s): LABPROT, INR in the last 72 hours. ABG No results for input(s): PHART, HCO3 in the last 72 hours.  Invalid input(s): PCO2, PO2  Studies/Results: Ct Head Wo Contrast  12/03/2013   CLINICAL DATA:  Altered mental status. Recent fall prior to admission. Initial encounter.  EXAM: CT HEAD WITHOUT CONTRAST  TECHNIQUE: Contiguous axial images were obtained from the base of the skull through the vertex without intravenous contrast.  COMPARISON:  None.  FINDINGS: Advanced atrophy with prominence of the bifrontal extra-axial spaces. Extensive periventricular hypodensities compatible with microvascular ischemic disease.  Given background parenchymal abnormalities, there is no CT evidence of acute large territory infarct. No intraparenchymal or extra-axial mass or hemorrhage. Normal size and configuration of the ventricles and basilar cisterns. No midline shift. Intracranial atherosclerosis. Regional soft tissues appear normal. No displaced calvarial fracture. Limited visualization of the paranasal sinuses demonstrates minimal polypoid mucosal thickening within the right sphenoid sinus. No air-fluid levels.  IMPRESSION: Advanced atrophy and microvascular ischemic disease without acute intracranial process.   Electronically Signed   By: Sandi Mariscal M.D.   On: 12/03/2013 16:36    Anti-infectives: Anti-infectives    Start     Dose/Rate Route Frequency Ordered Stop   12/03/13 1000  sulfamethoxazole-trimethoprim (BACTRIM DS,SEPTRA DS) 800-160 MG per tablet 1 tablet     1 tablet Oral Every 12 hours 12/03/13 0824     12/03/13 0000  sulfamethoxazole-trimethoprim (BACTRIM DS,SEPTRA DS) 800-160 MG per tablet     1 tablet Oral Every 12 hours 12/03/13 0827        Assessment/Plan:  Fall R rib FX 4-9 - pulmonary toilet UTI -- Treat with Septra DS BrCA w/ Recent B/L breast reconstruction - incisions look great DM  Cerebral atrophy and microvascular changes head CT.Her mental status changes are chronic. Dispo - SNF placement underway    LOS: 6 days    , M 12/04/2013

## 2013-12-05 LAB — GLUCOSE, CAPILLARY
GLUCOSE-CAPILLARY: 81 mg/dL (ref 70–99)
GLUCOSE-CAPILLARY: 233 mg/dL — AB (ref 70–99)
GLUCOSE-CAPILLARY: 116 mg/dL — AB (ref 70–99)
Glucose-Capillary: 145 mg/dL — ABNORMAL HIGH (ref 70–99)

## 2013-12-05 NOTE — Progress Notes (Signed)
  Subjective: Stable and alert. Pleasantly confused. No agitation. Denies shortness of breath. Does note right chest wall pain costal margin Denies abdominal discomfort. Tolerating diet  Case management is working with patient and son for SNF placement. This has not been completed yet. She will therefore need to stay in the hospital until placement is arranged.  Objective: Vital signs in last 24 hours: Temp:  [97.6 F (36.4 C)-99 F (37.2 C)] 97.8 F (36.6 C) (11/15 0616) Pulse Rate:  [85-97] 85 (11/15 0616) Resp:  [16-17] 16 (11/15 0616) BP: (104-140)/(52-66) 140/66 mmHg (11/15 0616) SpO2:  [94 %-99 %] 94 % (11/15 0616) Last BM Date:  (pt unable to remember)  Intake/Output from previous day: 11/14 0701 - 11/15 0700 In: 240 [P.O.:240] Out: -  Intake/Output this shift: Total I/O In: -  Out: 250 [Urine:250]   EXAm: General appearance: Alert. Cooperative. Pleasantly confused. Poor historian. Resp: clear to auscultation bilaterally.Right costal margin tender anteriorly and laterally.[name] GI: abdomen soft and nontender. Not distended.  Lab Results:  No results for input(s): WBC, HGB, HCT, PLT in the last 72 hours. BMET No results for input(s): NA, K, CL, CO2, GLUCOSE, BUN, CREATININE, CALCIUM in the last 72 hours. PT/INR No results for input(s): LABPROT, INR in the last 72 hours. ABG No results for input(s): PHART, HCO3 in the last 72 hours.  Invalid input(s): PCO2, PO2  Studies/Results: Ct Head Wo Contrast  12/03/2013   CLINICAL DATA:  Altered mental status. Recent fall prior to admission. Initial encounter.  EXAM: CT HEAD WITHOUT CONTRAST  TECHNIQUE: Contiguous axial images were obtained from the base of the skull through the vertex without intravenous contrast.  COMPARISON:  None.  FINDINGS: Advanced atrophy with prominence of the bifrontal extra-axial spaces. Extensive periventricular hypodensities compatible with microvascular ischemic disease. Given background  parenchymal abnormalities, there is no CT evidence of acute large territory infarct. No intraparenchymal or extra-axial mass or hemorrhage. Normal size and configuration of the ventricles and basilar cisterns. No midline shift. Intracranial atherosclerosis. Regional soft tissues appear normal. No displaced calvarial fracture. Limited visualization of the paranasal sinuses demonstrates minimal polypoid mucosal thickening within the right sphenoid sinus. No air-fluid levels.  IMPRESSION: Advanced atrophy and microvascular ischemic disease without acute intracranial process.   Electronically Signed   By: Sandi Mariscal M.D.   On: 12/03/2013 16:36    Anti-infectives: Anti-infectives    Start     Dose/Rate Route Frequency Ordered Stop   12/03/13 1000  sulfamethoxazole-trimethoprim (BACTRIM DS,SEPTRA DS) 800-160 MG per tablet 1 tablet     1 tablet Oral Every 12 hours 12/03/13 0824     12/03/13 0000  sulfamethoxazole-trimethoprim (BACTRIM DS,SEPTRA DS) 800-160 MG per tablet     1 tablet Oral Every 12 hours 12/03/13 0827        Assessment/Plan:   Fall R rib FX 4-9 - pulmonary toilet UTI -- Treat with Septra DS BrCA w/ Recent B/L breast reconstruction - incisions look great DM  Cerebral atrophy and microvascular changes head CT.Her mental status changes are chronic. Dispo - SNF placement underway      LOS: 7 days    Andrea [name] M 12/05/2013

## 2013-12-05 NOTE — Progress Notes (Signed)
Attempted to contact pt's son with bed offers, left message.  CSW will continue to follow to assist with discharge planning to SNF.    Apolonio Schneiders (weekend coverage) [phone]

## 2013-12-06 LAB — GLUCOSE, CAPILLARY
Glucose-Capillary: 114 mg/dL — ABNORMAL HIGH (ref 70–99)
Glucose-Capillary: 158 mg/dL — ABNORMAL HIGH (ref 70–99)
Glucose-Capillary: 136 mg/dL — ABNORMAL HIGH (ref 70–99)

## 2013-12-06 MED ORDER — HEPARIN SOD (PORK) LOCK FLUSH 100 UNIT/ML IV SOLN
500.0000 [IU] | INTRAVENOUS | Status: AC | PRN
  Administered 2013-12-06: 500 [IU]

## 2013-12-06 MED ORDER — ALPRAZOLAM 0.5 MG PO TABS: 0.5000 mg | ORAL_TABLET | Freq: Four times a day (QID) | ORAL | Status: DC

## 2013-12-06 MED ORDER — TRAMADOL HCL 50 MG PO TABS: 50.0000 mg | ORAL_TABLET | Freq: Four times a day (QID) | ORAL | Status: DC | PRN

## 2013-12-06 NOTE — Progress Notes (Signed)
Physical Therapy Treatment Patient Details Name: Andrea [name] MRN: [id number] DOB: 1950-11-10 Today's Date: 12/06/2013    History of Present Illness Adm 11/28/13 after fall getting off BSC ("it collapsed"). Pt had breast implant surgery 11/22/13 (after breast cancer). 12/02/13 incr confusion (?med related per Trauma) PMHx-breast cancer; epilepsy, DM    PT Comments    Pt much improved with incr alertness, however continued to need assistance maneuvering RW while walking (she walked into a large cart on her Rt side--visible for 15 ft prior to running into it). Noted she is refusing SNF and completed stair training with her. Emphasized that she needs to have someone with her to carry her RW up/down while walking on stairs. If pt refusing SNF, would recommend HHPT.  Follow Up Recommendations  SNF (pt now refusing)     Equipment Recommendations  None recommended by PT    Recommendations for Other Services       Precautions / Restrictions Precautions Precautions: Fall    Mobility  Bed Mobility Overal bed mobility: Needs Assistance Bed Mobility: Supine to Sit;Sit to Supine     Supine to sit: Supervision Sit to supine: Supervision;HOB elevated   General bed mobility comments: supervision for safety due to h/o impulsiveness  Transfers Overall transfer level: Needs assistance Equipment used: Rolling walker (2 wheeled) Transfers: Sit to/from Stand Sit to Stand: Min guard         General transfer comment: vc for safe use of RW and to align herself with bed prior to sitting  Ambulation/Gait Ambulation/Gait assistance: Min guard Ambulation Distance (Feet): 200 Feet Assistive device: Rolling walker (2 wheeled) Gait Pattern/deviations: Step-through pattern;Trunk flexed     General Gait Details: vc for proximity to RW and upright posture; poor ability to steer around a cart in the hallway (despite being visible for 15 ft as approaching)   Stairs Stairs: Yes Stairs  assistance: Min assist Stair Management: One rail Right;Alternating pattern;Step to pattern;Forwards Number of Stairs: 10 General stair comments: alternating to ascend; step to descend; pt able to carry RW in her Rt hand as descending with rail in Lt hand  Wheelchair Mobility    Modified Rankin (Stroke Patients Only)       Balance             Standing balance-Leahy Scale: Poor Standing balance comment: use of RW, bil UE support                    Cognition Arousal/Alertness: Awake/alert Behavior During Therapy: Flat affect Overall Cognitive Status: Impaired/Different from baseline Area of Impairment: Safety/judgement;Problem solving;Attention   Current Attention Level: Sustained Memory: Decreased short-term memory Following Commands: Follows one step commands with increased time;Follows one step commands inconsistently Safety/Judgement: Decreased awareness of safety (running into objects on her right with RW with decr awarenes) Awareness: Intellectual Problem Solving: Slow processing;Difficulty sequencing;Requires verbal cues;Requires tactile cues General Comments: did not recall meeting this therapist last week (I worked with her 3x). More alert, however told me yet another floor plan for her home (#steps, rails).     Exercises      General Comments General comments (skin integrity, edema, etc.): Reports her son is going to put a bed downstairs      Pertinent Vitals/Pain Pain Assessment: 0-10 Pain Score: 5  Pain Location: ribs Pain Intervention(s): Limited activity within patient's tolerance    Home Living  Prior Function            PT Goals (current goals can now be found in the care plan section) Acute Rehab PT Goals Patient Stated Goal: go to stay with her ex-husband and wife Progress towards PT goals: Progressing toward goals    Frequency  Min 4X/week    PT Plan Current plan remains appropriate     Co-evaluation             End of Session Equipment Utilized During Treatment: Gait belt Activity Tolerance: Patient tolerated treatment well Patient left: in bed;with call bell/phone within reach;with bed alarm set;with nursing/sitter in room     Time: [id number] PT Time Calculation (min) (ACUTE ONLY): 18 min  Charges:  $Gait Training: 8-22 mins                    G Codes:      Holman Bonsignore 12/10/2013, 12:55 PM  Pager [phone]

## 2013-12-06 NOTE — Progress Notes (Signed)
Medicare IM (Important Message) delivered to patient today by me in anticipation of discharge.   Karine Garn, RN BSN [facility] CCM  Case Manager, Trauma Service/Unit 3M [phone]  

## 2013-12-06 NOTE — Discharge Summary (Signed)
Physician Discharge Summary  Patient ID: Andrea [name] MRN: [id number] DOB/AGE: Dec 01, 1950 63 y.o.  Admit date: 11/28/2013 Discharge date: 12/06/2013  Discharge Diagnoses Patient Active Problem List   Diagnosis Date Noted  . UTI (urinary tract infection) 12/03/2013  . Fall 12/02/2013  . Hypertension   . Breast cancer   . Diabetes mellitus without complication   . Migraine   . Hypothyroid   . Epilepsy   . Esophageal reflux   . Multiple rib fractures 11/28/2013    Consultants None   Procedures None   HPI: Andrea [name] presented after a fall that occurred several days prior to admission. She is a breast cancer patient and underwent exchange of her tissue expanders to formal implants bilaterally at [facility] the week prior. She fell off of her bedside commode several days ago. She presented to [facility] with right-sided chest pain. Apparently a complete workup including CAT scans were unremarkable so she was discharged. She presented to [facility] a few days later because of increasing pain. A CT scan of her chest showed multiple rib fractures. She was admitted for pain control and pulmonary toilet.   Hospital Course: The patient's pain was initially controlled with a PCA and then she was transitioned to oral medications. She was mobilized with physical and occupational therapies who recommended skilled nursing facility placement but the patient insisted on going home. She began to have some confusion later on in her hospital course and this was likely due to a combination of narcotics and a mild urinary tract infection. These were both addressed and she was much improved but it turned out on her planned day of discharge that the supervisory arrangement she told us she had worked out was not actually true. After speaking with her son he noted she would have periods of confusion at baseline. A search was begun for a skilled nursing facility and one was located. However, by  that point her mental status had improved to the point that she was competent to make decisions in her own best interests and she refused placement and insisted on going home. The risks were explained to the patient who understood them and was able to verbalize strategies to minimize them. She understands that she is going home against the advice of her medical team but insists on it anyway. She was discharged home in stable condition.    Medication List    STOP taking these medications        cyclobenzaprine 10 MG tablet  Commonly known as:  FLEXERIL     HYDROcodone-acetaminophen 7.5-325 MG per tablet  Commonly known as:  NORCO     HYDROmorphone 2 MG tablet  Commonly known as:  DILAUDID      TAKE these medications        ALPRAZolam 0.5 MG tablet  Commonly known as:  XANAX  Take 1 tablet (0.5 mg total) by mouth 4 (four) times daily.     atenolol 50 MG tablet  Commonly known as:  TENORMIN  Take 50 mg by mouth daily.     citalopram 20 MG tablet  Commonly known as:  CELEXA  Take 20 mg by mouth daily.     clotrimazole-betamethasone cream  Commonly known as:  LOTRISONE  Apply 1 application topically 2 (two) times daily.     divalproex 500 MG DR tablet  Commonly known as:  DEPAKOTE  Take 500 mg by mouth 3 (three) times daily.     gabapentin 300 MG capsule  Commonly  known as:  NEURONTIN  Take 300 mg by mouth 3 (three) times daily.     glucose blood test strip  1 each by Other route 3 (three) times daily. Use as instructed     insulin aspart 100 UNIT/ML injection  Commonly known as:  novoLOG  Inject 4-12 Units into the skin 3 (three) times daily before meals. Per sliding scale. For every 100 or >, uses 2 additional units.     insulin glargine 100 UNIT/ML injection  Commonly known as:  LANTUS  Inject 30 Units into the skin daily.     levothyroxine 200 MCG tablet  Commonly known as:  SYNTHROID, LEVOTHROID  Take 400 mcg by mouth daily before breakfast.      lidocaine-prilocaine cream  Commonly known as:  EMLA  Apply 1 application topically as needed.     lisinopril 10 MG tablet  Commonly known as:  PRINIVIL,ZESTRIL  Take 10 mg by mouth daily.     loperamide 2 MG capsule  Commonly known as:  IMODIUM  Take by mouth as needed for diarrhea or loose stools.     niacin 250 MG tablet  Take 250 mg by mouth at bedtime.     ondansetron 4 MG disintegrating tablet  Commonly known as:  ZOFRAN-ODT  Take 4 mg by mouth every 8 (eight) hours as needed for nausea or vomiting.     sulfamethoxazole-trimethoprim 800-160 MG per tablet  Commonly known as:  BACTRIM DS,SEPTRA DS  Take 1 tablet by mouth every 12 (twelve) hours.     traMADol 50 MG tablet  Commonly known as:  ULTRAM  Take 1-2 tablets (50-100 mg total) by mouth every 6 (six) hours as needed (Pain).     zolpidem 10 MG tablet  Commonly known as:  AMBIEN  Take 10 mg by mouth at bedtime as needed for sleep.             Follow-up Information    Follow up with Vickii Chafe, MD.   Specialty:  Family Medicine      Follow up with [place].   Why:  As needed   Contact information:   [address] [place] [address] [phone]        Signed: Lisette Abu, PA-C Pager: [phone] General Trauma PA Pager: [phone] 12/06/2013, 11:30 AM

## 2013-12-06 NOTE — Progress Notes (Signed)
Talked with Son and gave an update that patient is discharged and if he can come pick up patient. Son stated the previous plan was to placed patient in the SNF. Handed phone to the patient and will get update from Carey and case management.

## 2013-12-06 NOTE — Clinical Social Work Note (Signed)
CSW received SNF bed offer for pt for admission on 12/06/2013. CSW met with pt at bedside to provide SNF bed offer ([facility]).   Pt refusing SNF placement. CSW and pt discussed medical team safety concern regarding pt discharging home. Pt expressed understanding and continued refusing SNF placement. CSW updated RNCM and PA.   Pt to be discharged home. CSW signing off. Thank you.  Lubertha Sayres, [place] [phone]) Licensed Clinical Social Worker Orthopedics [phone]) and Surgical [phone])

## 2013-12-06 NOTE — Progress Notes (Signed)
Patient ID: Andrea [name], female   DOB: 10/15/1950, 63 y.o.   MRN: [id number]   LOS: 8 days   Subjective: No c/o.   Objective: Vital signs in last 24 hours: Temp:  [97.6 F (36.4 C)-98 F (36.7 C)] 97.6 F (36.4 C) (11/16 0550) Pulse Rate:  [82-88] 88 (11/16 0550) Resp:  [16-17] 17 (11/16 0550) BP: (131-148)/(62-67) 131/67 mmHg (11/16 0550) SpO2:  [96 %-98 %] 96 % (11/16 0550) Last BM Date:  (pt unable to remember)   Laboratory Results CBG (last 3)   Recent Labs  12/05/13 1145 12/05/13 1700 12/05/13 2207  GLUCAP 233* 145* 116*    Physical Exam General appearance: alert and no distress Resp: clear to auscultation bilaterally Cardio: regular rate and rhythm GI: normal findings: bowel sounds normal and soft, non-tender   Assessment/Plan: Fall R rib FX 4-9 - pulmonary toilet UTI -- Treat with Septra DS BrCA w/ Recent B/L breast reconstruction - incisions look great DM  Dispo - D/C to SNF when bed available     J. , PA-C Pager: [phone] General Trauma PA Pager: [phone]  12/06/2013 

## 2013-12-06 NOTE — Clinical Social Work Note (Signed)
CSW consulted for EMS ([facility]) transportation for pt's discharge on 12/06/2013. CSW confirmed pt's home address as: [place], [place] [address]. CSW has arranged for EMS ([facility]) transportation. RN updated and aware. CSW signing off. Thank you.  Lubertha Sayres, [place] ([phone]) Licensed Clinical Social Worker Orthopedics [phone]) and Surgical [phone])

## 2013-12-06 NOTE — Progress Notes (Signed)
Talked with the SW and the case manager and they stated that patient is competent to make decisions for herself and patient refusing SNF placement. Left a message to patient son to call for update.

## 2014-02-04 ENCOUNTER — Inpatient Hospital Stay (HOSPITAL_COMMUNITY)
Admission: EM | Admit: 2014-02-04 | Discharge: 2014-02-08 | DRG: 387 | Disposition: A | Discharge status: Home / Self Care | Payer: Medicare PPO | Attending: Internal Medicine | Admitting: Internal Medicine

## 2014-02-04 ENCOUNTER — Encounter (HOSPITAL_COMMUNITY): Payer: Self-pay | Admitting: Emergency Medicine

## 2014-02-04 ENCOUNTER — Emergency Department (HOSPITAL_COMMUNITY): Payer: Medicare PPO

## 2014-02-04 DIAGNOSIS — R112 Nausea with vomiting, unspecified: Secondary | ICD-10-CM | POA: Diagnosis present

## 2014-02-04 DIAGNOSIS — E860 Dehydration: Secondary | ICD-10-CM | POA: Diagnosis present

## 2014-02-04 DIAGNOSIS — Z9119 Patient's noncompliance with other medical treatment and regimen: Secondary | ICD-10-CM | POA: Diagnosis present

## 2014-02-04 DIAGNOSIS — Z853 Personal history of malignant neoplasm of breast: Secondary | ICD-10-CM

## 2014-02-04 DIAGNOSIS — K5150 Left sided colitis without complications: Secondary | ICD-10-CM | POA: Diagnosis not present

## 2014-02-04 DIAGNOSIS — E119 Type 2 diabetes mellitus without complications: Secondary | ICD-10-CM

## 2014-02-04 DIAGNOSIS — Z886 Allergy status to analgesic agent status: Secondary | ICD-10-CM

## 2014-02-04 DIAGNOSIS — R109 Unspecified abdominal pain: Secondary | ICD-10-CM | POA: Insufficient documentation

## 2014-02-04 DIAGNOSIS — Z9010 Acquired absence of unspecified breast and nipple: Secondary | ICD-10-CM | POA: Diagnosis present

## 2014-02-04 DIAGNOSIS — F439 Reaction to severe stress, unspecified: Secondary | ICD-10-CM | POA: Diagnosis present

## 2014-02-04 DIAGNOSIS — Z885 Allergy status to narcotic agent status: Secondary | ICD-10-CM

## 2014-02-04 DIAGNOSIS — K529 Noninfective gastroenteritis and colitis, unspecified: Secondary | ICD-10-CM

## 2014-02-04 DIAGNOSIS — Z888 Allergy status to other drugs, medicaments and biological substances status: Secondary | ICD-10-CM

## 2014-02-04 DIAGNOSIS — G40909 Epilepsy, unspecified, not intractable, without status epilepticus: Secondary | ICD-10-CM | POA: Diagnosis present

## 2014-02-04 DIAGNOSIS — I10 Essential (primary) hypertension: Secondary | ICD-10-CM | POA: Diagnosis present

## 2014-02-04 DIAGNOSIS — R569 Unspecified convulsions: Secondary | ICD-10-CM

## 2014-02-04 DIAGNOSIS — R197 Diarrhea, unspecified: Secondary | ICD-10-CM | POA: Diagnosis present

## 2014-02-04 DIAGNOSIS — E039 Hypothyroidism, unspecified: Secondary | ICD-10-CM | POA: Diagnosis present

## 2014-02-04 HISTORY — DX: Unspecified convulsions: R56.9

## 2014-02-04 HISTORY — DX: Malignant (primary) neoplasm, unspecified: C80.1

## 2014-02-04 LAB — CBC WITH DIFFERENTIAL/PLATELET
HCT: 38.9 % (ref 36.0–46.0)
Hemoglobin: 13.6 g/dL (ref 12.0–15.0)
Lymphocytes Relative: 10 % — ABNORMAL LOW (ref 12–46)
Lymphs Abs: 1.1 10*3/uL (ref 0.7–4.0)
MCHC: 35 g/dL (ref 30.0–36.0)
Monocytes Absolute: 0.5 10*3/uL (ref 0.1–1.0)
Neutro Abs: 8.9 10*3/uL — ABNORMAL HIGH (ref 1.7–7.7)
Platelets: 329 10*3/uL (ref 150–400)

## 2014-02-04 MED ORDER — SODIUM CHLORIDE 0.9 % IV BOLUS (SEPSIS)
1000.0000 mL | Freq: Once | INTRAVENOUS | Status: AC
  Administered 2014-02-05: 1000 mL via INTRAVENOUS

## 2014-02-04 MED ORDER — ONDANSETRON HCL 4 MG/2ML IJ SOLN
4.0000 mg | Freq: Once | INTRAMUSCULAR | Status: AC
  Administered 2014-02-05: 4 mg via INTRAVENOUS
  Filled 2014-02-04: qty 2
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
## 2014-02-04 NOTE — ED Notes (Signed)
Dr Claudine Mouton given a copy of lactic acid 4.32
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
## 2014-02-05 ENCOUNTER — Encounter (HOSPITAL_COMMUNITY): Payer: Self-pay | Admitting: Emergency Medicine

## 2014-02-05 ENCOUNTER — Inpatient Hospital Stay (HOSPITAL_COMMUNITY): Payer: Medicare PPO

## 2014-02-05 DIAGNOSIS — K5150 Left sided colitis without complications: Secondary | ICD-10-CM | POA: Diagnosis present

## 2014-02-05 DIAGNOSIS — Z853 Personal history of malignant neoplasm of breast: Secondary | ICD-10-CM | POA: Diagnosis not present

## 2014-02-05 DIAGNOSIS — F439 Reaction to severe stress, unspecified: Secondary | ICD-10-CM | POA: Diagnosis present

## 2014-02-05 DIAGNOSIS — I10 Essential (primary) hypertension: Secondary | ICD-10-CM | POA: Diagnosis present

## 2014-02-05 DIAGNOSIS — Z9119 Patient's noncompliance with other medical treatment and regimen: Secondary | ICD-10-CM | POA: Diagnosis present

## 2014-02-05 DIAGNOSIS — E039 Hypothyroidism, unspecified: Secondary | ICD-10-CM | POA: Diagnosis present

## 2014-02-05 DIAGNOSIS — E119 Type 2 diabetes mellitus without complications: Secondary | ICD-10-CM

## 2014-02-05 DIAGNOSIS — R197 Diarrhea, unspecified: Secondary | ICD-10-CM

## 2014-02-05 DIAGNOSIS — Z886 Allergy status to analgesic agent status: Secondary | ICD-10-CM | POA: Diagnosis not present

## 2014-02-05 DIAGNOSIS — G40909 Epilepsy, unspecified, not intractable, without status epilepticus: Secondary | ICD-10-CM | POA: Diagnosis present

## 2014-02-05 DIAGNOSIS — Z9010 Acquired absence of unspecified breast and nipple: Secondary | ICD-10-CM | POA: Diagnosis present

## 2014-02-05 DIAGNOSIS — R569 Unspecified convulsions: Secondary | ICD-10-CM

## 2014-02-05 DIAGNOSIS — R112 Nausea with vomiting, unspecified: Secondary | ICD-10-CM | POA: Diagnosis present

## 2014-02-05 DIAGNOSIS — Z885 Allergy status to narcotic agent status: Secondary | ICD-10-CM | POA: Diagnosis not present

## 2014-02-05 DIAGNOSIS — E860 Dehydration: Secondary | ICD-10-CM | POA: Diagnosis present

## 2014-02-05 DIAGNOSIS — Z888 Allergy status to other drugs, medicaments and biological substances status: Secondary | ICD-10-CM | POA: Diagnosis not present

## 2014-02-05 LAB — LIPID PANEL
HDL: 43 mg/dL (ref >39)
LDL Cholesterol: 176 mg/dL — ABNORMAL HIGH (ref 0–99)
VLDL: 44 mg/dL — ABNORMAL HIGH (ref 0–40)

## 2014-02-05 LAB — COMPREHENSIVE METABOLIC PANEL
ALT: 37 U/L — ABNORMAL HIGH (ref 0–35)
ALT: 33 U/L (ref 0–35)
AST: 46 U/L — ABNORMAL HIGH (ref 0–37)
Albumin: 4 g/dL (ref 3.5–5.2)
Alkaline Phosphatase: 114 U/L (ref 39–117)
Alkaline Phosphatase: 117 U/L (ref 39–117)
Anion gap: 17 — ABNORMAL HIGH (ref 5–15)
Anion gap: 7 (ref 5–15)
BUN: 14 mg/dL (ref 6–23)
BUN: 11 mg/dL (ref 6–23)
CO2: 31 mmol/L (ref 19–32)
Calcium: 9.7 mg/dL (ref 8.4–10.5)
Chloride: 99 mEq/L (ref 96–112)
Creatinine, Ser: 0.71 mg/dL (ref 0.50–1.10)
GFR calc non Af Amer: >90 mL/min (ref >90)
Glucose, Bld: 416 mg/dL — ABNORMAL HIGH (ref 70–99)
Sodium: 134 mmol/L — ABNORMAL LOW (ref 135–145)
Sodium: 137 mmol/L (ref 135–145)
Total Bilirubin: 1 mg/dL (ref 0.3–1.2)
Total Bilirubin: 1.2 mg/dL (ref 0.3–1.2)
Total Protein: 8.2 g/dL (ref 6.0–8.3)
Total Protein: 7.8 g/dL (ref 6.0–8.3)

## 2014-02-05 LAB — TSH: TSH: 2.839 u[IU]/mL (ref 0.350–4.500)

## 2014-02-05 LAB — CBC
HCT: 37.6 % (ref 36.0–46.0)
MCHC: 35.1 g/dL (ref 30.0–36.0)
Platelets: 335 10*3/uL (ref 150–400)
RBC: 4.25 MIL/uL (ref 3.87–5.11)

## 2014-02-05 LAB — PROTIME-INR: Prothrombin Time: 14.1 seconds (ref 11.6–15.2)

## 2014-02-05 LAB — GLUCOSE, CAPILLARY: Glucose-Capillary: 247 mg/dL — ABNORMAL HIGH (ref 70–99)

## 2014-02-05 MED ORDER — GADOBENATE DIMEGLUMINE 529 MG/ML IV SOLN
15.0000 mL | Freq: Once | INTRAVENOUS | Status: AC | PRN
  Administered 2014-02-05: 15 mL via INTRAVENOUS

## 2014-02-05 MED ORDER — SODIUM CHLORIDE 0.9 % IV SOLN
INTRAVENOUS | Status: DC
  Administered 2014-02-06: 1000 mL via INTRAVENOUS

## 2014-02-05 MED ORDER — LORAZEPAM 2 MG/ML IJ SOLN
2.0000 mg | INTRAMUSCULAR | Status: DC | PRN
  Administered 2014-02-06: 2 mg via INTRAVENOUS
  Filled 2014-02-05: qty 1

## 2014-02-05 MED ORDER — INSULIN ASPART 100 UNIT/ML ~~LOC~~ SOLN
0.0000 [IU] | Freq: Three times a day (TID) | SUBCUTANEOUS | Status: DC
  Administered 2014-02-05 (×2): 3 [IU] via SUBCUTANEOUS
  Administered 2014-02-06: 2 [IU] via SUBCUTANEOUS
  Administered 2014-02-06: 3 [IU] via SUBCUTANEOUS
  Administered 2014-02-06 – 2014-02-07 (×2): 2 [IU] via SUBCUTANEOUS

## 2014-02-05 MED ORDER — OXYCODONE-ACETAMINOPHEN 5-325 MG PO TABS
1.0000 | ORAL_TABLET | Freq: Four times a day (QID) | ORAL | Status: DC | PRN
  Administered 2014-02-05 – 2014-02-08 (×13): 1 via ORAL
  Filled 2014-02-05 (×13): qty 1

## 2014-02-05 MED ORDER — LISINOPRIL 20 MG PO TABS
20.0000 mg | ORAL_TABLET | Freq: Every day | ORAL | Status: DC
  Administered 2014-02-05 – 2014-02-08 (×4): 20 mg via ORAL
  Filled 2014-02-05 (×4): qty 1

## 2014-02-05 MED ORDER — ONDANSETRON HCL 4 MG/2ML IJ SOLN
4.0000 mg | Freq: Three times a day (TID) | INTRAMUSCULAR | Status: DC | PRN
  Administered 2014-02-05 – 2014-02-08 (×6): 4 mg via INTRAVENOUS
  Filled 2014-02-05 (×6): qty 2

## 2014-02-05 MED ORDER — SODIUM CHLORIDE 0.9 % IV BOLUS (SEPSIS)
1000.0000 mL | Freq: Once | INTRAVENOUS | Status: AC
  Administered 2014-02-05: 1000 mL via INTRAVENOUS

## 2014-02-05 MED ORDER — LORAZEPAM 2 MG/ML IJ SOLN
1.0000 mg | Freq: Once | INTRAMUSCULAR | Status: AC
  Administered 2014-02-05: 1 mg via INTRAVENOUS
  Filled 2014-02-05: qty 1

## 2014-02-05 MED ORDER — LEVETIRACETAM 500 MG PO TABS
500.0000 mg | ORAL_TABLET | Freq: Two times a day (BID) | ORAL | Status: DC
  Administered 2014-02-05 – 2014-02-08 (×7): 500 mg via ORAL
  Filled 2014-02-05 (×8): qty 1

## 2014-02-05 MED ORDER — INSULIN GLARGINE 100 UNIT/ML ~~LOC~~ SOLN
10.0000 [IU] | Freq: Every day | SUBCUTANEOUS | Status: DC
  Administered 2014-02-06 – 2014-02-08 (×3): 10 [IU] via SUBCUTANEOUS
  Filled 2014-02-05 (×4): qty 0.1

## 2014-02-05 MED ORDER — INSULIN GLARGINE 100 UNIT/ML ~~LOC~~ SOLN
5.0000 [IU] | Freq: Every day | SUBCUTANEOUS | Status: DC
  Administered 2014-02-05: 5 [IU] via SUBCUTANEOUS
  Filled 2014-02-05: qty 0.05

## 2014-02-05 MED ORDER — HYDRALAZINE HCL 20 MG/ML IJ SOLN
5.0000 mg | INTRAMUSCULAR | Status: DC | PRN
  Administered 2014-02-06: 5 mg via INTRAVENOUS
  Filled 2014-02-05: qty 1

## 2014-02-05 MED ORDER — HEPARIN SODIUM (PORCINE) 5000 UNIT/ML IJ SOLN
5000.0000 [IU] | Freq: Three times a day (TID) | INTRAMUSCULAR | Status: DC
  Administered 2014-02-05 – 2014-02-07 (×6): 5000 [IU] via SUBCUTANEOUS
  Filled 2014-02-05 (×10): qty 1

## 2014-02-05 MED ORDER — LEVOTHYROXINE SODIUM 50 MCG PO TABS
50.0000 ug | ORAL_TABLET | Freq: Every day | ORAL | Status: DC
  Administered 2014-02-05 – 2014-02-08 (×4): 50 ug via ORAL
  Filled 2014-02-05 (×6): qty 1

## 2014-02-05 MED ORDER — OXYCODONE HCL 5 MG PO TABS
10.0000 mg | ORAL_TABLET | Freq: Once | ORAL | Status: AC
  Administered 2014-02-05: 10 mg via ORAL
  Filled 2014-02-05: qty 2

## 2014-02-05 MED ORDER — SODIUM CHLORIDE 0.9 % IJ SOLN
3.0000 mL | Freq: Two times a day (BID) | INTRAMUSCULAR | Status: DC
  Administered 2014-02-05 – 2014-02-06 (×2): 3 mL via INTRAVENOUS
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
## 2014-02-05 NOTE — Progress Notes (Signed)
[facility] Progress Note   Andrea [name] [id number] DOB: 1950-12-24 DOA: 02/04/2014 PCP: Vickii Chafe, MD  Brief narrative: Andrea [name] is a 65 y.o. female with medical history of seizure, breast cancer, hypertension, hypothyroidism, diabetes mellitus, who presents with seizure, nausea, vomiting, diarrhea and found to have a hypertensive urgency.  Subjective: No longer vomiting but nauseated and having abdominal discomfort.   Assessment/Plan: Principal Problem:   Seizure - cont Keppra - neuro assisting with management- MRI unremarkable  Active Problems:   Dehydration/ vomiting and diarrhea - cont IVF and supportive care- awaiting stool sample for GI pathogen panel    History of breast cancer - has breast reconstruction scheduled on Monday- will see if she can recover by then    Diabetes mellitus without complication - cont current dose of lantus but increase to 10 U QHS and cont sliding scale insluin    Hypothyroidism - cont synthroid  Code Status: Full Family Communication:  Disposition Plan:  DVT prophylaxis: Heparin  Consultants:  Procedures:  Antibiotics: Anti-infectives    None     Objective: Filed Weights   02/04/14 2132  Weight: 70.308 kg (155 lb)    Intake/Output Summary (Last 24 hours) at 02/05/14 1216 Last data filed at 02/05/14 0945  Gross per 24 hour  Intake    220 ml  Output      0 ml  Net    220 ml     Vitals Filed Vitals:   02/05/14 0030 02/05/14 0128 02/05/14 0156 02/05/14 0608  BP: 192/80 166/87 177/77 174/80  Pulse: 116  107 102  Temp:   100 F (37.8 C) 99.6 F (37.6 C)  TempSrc:   Oral Oral  Resp: 16 16 12 12   Height:      Weight:      SpO2: 97% 98% 94% 97%    Exam: General: AAO x 3, No acute respiratory distress Lungs: Clear to auscultation bilaterally without wheezes or crackles Cardiovascular: Regular rate and rhythm without murmur gallop or rub normal S1 and S2 Abdomen: mild diffuse tenderness,  nondistended, soft, bowel sounds positive, no rebound, no ascites, no appreciable mass Extremities: No significant cyanosis, clubbing, or edema bilateral lower extremities  Data Reviewed: Basic Metabolic Panel:  Recent Labs Lab 02/04/14 2301 02/05/14 0305  NA 134* 137  K 4.4 3.9  CL 96 99  CO2 21 31  GLUCOSE 416* 303*  BUN 14 11  CREATININE 0.86 0.71  CALCIUM 10.2 9.7   Liver Function Tests:  Recent Labs Lab 02/04/14 2301 02/05/14 0305  AST 46* 33  ALT 37* 33  ALKPHOS 114 117  BILITOT 1.0 1.2  PROT 8.2 7.8  ALBUMIN 4.2 4.0   No results for input(s): LIPASE, AMYLASE in the last 168 hours. No results for input(s): AMMONIA in the last 168 hours. CBC:  Recent Labs Lab 02/04/14 2301 02/05/14 0305  WBC 10.5 10.4  NEUTROABS 8.9*  --   HGB 13.6 13.2  HCT 38.9 37.6  MCV 87.8 88.5  PLT 329 335   Cardiac Enzymes: No results for input(s): CKTOTAL, CKMB, CKMBINDEX, TROPONINI in the last 168 hours. BNP (last 3 results) No results for input(s): PROBNP in the last 8760 hours. CBG:  Recent Labs Lab 02/05/14 0940  GLUCAP 219*    No results found for this or any previous visit (from the past 240 hour(s)).   Studies:  Recent x-ray studies have been reviewed in detail by the Attending Physician  Scheduled Meds:  Scheduled Meds: . heparin  5,000 Units Subcutaneous 3 times per day  . insulin aspart  0-9 Units Subcutaneous TID WC  . insulin glargine  5 Units Subcutaneous Daily  . levETIRAcetam  500 mg Oral BID  . levothyroxine  50 mcg Oral QAC breakfast  . lisinopril  20 mg Oral Daily  . sodium chloride  3 mL Intravenous Q12H   Continuous Infusions: . sodium chloride Stopped (02/05/14 0616)    Time spent on care of this patient: 51 min   [place], MD 02/05/2014, 12:16 PM  LOS: 1 day   [facility] Office  [phone] Pager - Text Page per [email or web address]  If 7PM-7AM, please contact night-coverage [email or web address]
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
## 2014-02-05 NOTE — ED Notes (Signed)
Pt was cleaned and changed. Pt stated she has been incontinent since last night.

## 2014-02-05 NOTE — Evaluation (Signed)
Physical Therapy Evaluation Patient Details Name: Andrea [name] MRN: [id number] DOB: [birth year] Today's Date: 02/05/2014   History of Present Illness  Pt is a 64 y.o. female with PMH of seizure, breast CA, HTN, hypothyroidism, DM, who presents with seizure, nausea, vomiting, diarrhea. Patient reports that she had hx of seizure and was on Depakote, which she stopped taking after she had breast cancer surgery about 4 months ago.   Clinical Impression  Pt admitted with above diagnosis. Pt currently with functional limitations due to the deficits listed below (see PT Problem List). At the time of PT eval pt demonstrated decreased strength, balance difficulty with functional mobility, and decreased tolerance for functional activity.    Pt will benefit from skilled PT to increase their independence and safety with mobility to allow discharge to the venue listed below.  As pt lives alone feel that she will require STR at d/c prior to returning home alone. Pt is agreeable at this time.      Follow Up Recommendations SNF;Supervision/Assistance - 24 hour    Equipment Recommendations  None recommended by PT    Recommendations for Other Services       Precautions / Restrictions Precautions Precautions: Fall Restrictions Weight Bearing Restrictions: No      Mobility  Bed Mobility Overal bed mobility: Needs Assistance Bed Mobility: Supine to Sit;Sit to Supine     Supine to sit: Supervision Sit to supine: Supervision   General bed mobility comments: Pt did not require physical assist to transition to EOB, however once sitting required assist to maintain sitting balance initially.   Transfers Overall transfer level: Needs assistance Equipment used: None Transfers: Sit to/from Stand Sit to Stand: Min assist         General transfer comment: Assist to power-up to full standing. Pt unsteady and min assist provided to recover.   Ambulation/Gait Ambulation/Gait assistance: Min  assist Ambulation Distance (Feet): 75 Feet Assistive device: 1 person hand held assist (Pushed IV pole with other hand) Gait Pattern/deviations: Decreased stride length;Shuffle;Trunk flexed;Narrow base of support Gait velocity: Decreased Gait velocity interpretation: Below normal speed for age/gender General Gait Details: Pt pushing IV pole with right hand and HHA with therapist on L hand. Pt unsteady occasionally requiring min assist to recover. Overall moving very slow and guarded.   Stairs            Wheelchair Mobility    Modified Rankin (Stroke Patients Only)       Balance Overall balance assessment: Needs assistance Sitting-balance support: Feet supported;Bilateral upper extremity supported Sitting balance-Leahy Scale: Poor Sitting balance - Comments: Required assist to maintain seated balance ~2 minutes before she was able to hold herself up.  Postural control: Posterior lean Standing balance support: Bilateral upper extremity supported;During functional activity Standing balance-Leahy Scale: Poor                               Pertinent Vitals/Pain Pain Assessment: No/denies pain    Home Living Family/patient expects to be discharged to:: Private residence Living Arrangements: Alone Available Help at Discharge: Family;Available PRN/intermittently Type of Home: Apartment ([place] home) Home Access: Stairs to enter   [employer] of Steps: 3 Home Layout: Two level;Able to live on main level with bedroom/bathroom Home Equipment: [place] - 2 wheels;Bedside commode      Prior Function Level of Independence: Independent               Hand Dominance  Dominant Hand: Right    Extremity/Trunk Assessment   Upper Extremity Assessment: Defer to OT evaluation           Lower Extremity Assessment: Generalized weakness      Cervical / Trunk Assessment: Normal  Communication   Communication: No difficulties  Cognition  Arousal/Alertness: Awake/alert Behavior During Therapy: WFL for tasks assessed/performed Overall Cognitive Status: Within Functional Limits for tasks assessed                      General Comments      Exercises        Assessment/Plan    PT Assessment Patient needs continued PT services  PT Diagnosis Difficulty walking;Generalized weakness   PT Problem List Decreased strength;Decreased activity tolerance;Decreased range of motion;Decreased balance;Decreased mobility;Decreased knowledge of use of DME;Decreased safety awareness;Decreased knowledge of precautions  PT Treatment Interventions DME instruction;Stair training;Gait training;Functional mobility training;Therapeutic activities;Therapeutic exercise;Neuromuscular re-education;Patient/family education   PT Goals (Current goals can be found in the Care Plan section) Acute Rehab PT Goals Patient Stated Goal: Return home when independent.  PT Goal Formulation: With patient Time For Goal Achievement: 02/19/14 Potential to Achieve Goals: Good    Frequency Min 2X/week   Barriers to discharge Decreased caregiver support      Co-evaluation               End of Session Equipment Utilized During Treatment: Gait belt Activity Tolerance: Patient limited by fatigue Patient left: in bed;with call bell/phone within reach;with bed alarm set Nurse Communication: Mobility status         Time: [id number] PT Time Calculation (min) (ACUTE ONLY): 28 min   Charges:   PT Evaluation $Initial PT Evaluation Tier I: 1 Procedure PT Treatments $Gait Training: 8-22 mins $Therapeutic Activity: 8-22 mins   PT G Codes:        Rolinda Roan March 07, 2014, 2:18 PM   Rolinda Roan, PT, DPT Acute Rehabilitation Services Pager: [phone]

## 2014-02-06 ENCOUNTER — Encounter (HOSPITAL_COMMUNITY): Payer: Self-pay

## 2014-02-06 ENCOUNTER — Inpatient Hospital Stay (HOSPITAL_COMMUNITY): Payer: Medicare PPO

## 2014-02-06 DIAGNOSIS — E860 Dehydration: Secondary | ICD-10-CM

## 2014-02-06 DIAGNOSIS — E119 Type 2 diabetes mellitus without complications: Secondary | ICD-10-CM

## 2014-02-06 DIAGNOSIS — K529 Noninfective gastroenteritis and colitis, unspecified: Secondary | ICD-10-CM

## 2014-02-06 LAB — URINALYSIS, ROUTINE W REFLEX MICROSCOPIC
Bilirubin Urine: NEGATIVE
Glucose, UA: 250 mg/dL — AB
Hgb urine dipstick: NEGATIVE
Ketones, ur: 15 mg/dL — AB
Nitrite: NEGATIVE
Protein, ur: NEGATIVE mg/dL
Specific Gravity, Urine: 1.012 (ref 1.005–1.030)
Urobilinogen, UA: 1 mg/dL (ref 0.0–1.0)
pH: 6.5 (ref 5.0–8.0)

## 2014-02-06 LAB — BASIC METABOLIC PANEL WITH GFR
Anion gap: 5 (ref 5–15)
BUN: 6 mg/dL (ref 6–23)
CO2: 29 mmol/L (ref 19–32)
Calcium: 9 mg/dL (ref 8.4–10.5)
Chloride: 102 meq/L (ref 96–112)
Creatinine, Ser: 0.54 mg/dL (ref 0.50–1.10)
GFR calc Af Amer: >90 mL/min
GFR calc non Af Amer: >90 mL/min
Glucose, Bld: 181 mg/dL — ABNORMAL HIGH (ref 70–99)
Potassium: 3.4 mmol/L — ABNORMAL LOW (ref 3.5–5.1)
Sodium: 136 mmol/L (ref 135–145)

## 2014-02-06 LAB — CBC
HCT: 34.5 % — ABNORMAL LOW (ref 36.0–46.0)
Hemoglobin: 12.1 g/dL (ref 12.0–15.0)
MCH: 30.4 pg (ref 26.0–34.0)
MCHC: 35.1 g/dL (ref 30.0–36.0)
MCV: 86.7 fL (ref 78.0–100.0)
RBC: 3.98 MIL/uL (ref 3.87–5.11)

## 2014-02-06 LAB — URINE MICROSCOPIC-ADD ON

## 2014-02-06 LAB — GLUCOSE, CAPILLARY
Glucose-Capillary: 198 mg/dL — ABNORMAL HIGH (ref 70–99)
Glucose-Capillary: 208 mg/dL — ABNORMAL HIGH (ref 70–99)
Glucose-Capillary: 156 mg/dL — ABNORMAL HIGH (ref 70–99)
Glucose-Capillary: 221 mg/dL — ABNORMAL HIGH (ref 70–99)

## 2014-02-06 MED ORDER — CIPROFLOXACIN IN D5W 400 MG/200ML IV SOLN
400.0000 mg | Freq: Two times a day (BID) | INTRAVENOUS | Status: DC
  Administered 2014-02-06 – 2014-02-08 (×4): 400 mg via INTRAVENOUS
  Filled 2014-02-06 (×5): qty 200

## 2014-02-06 MED ORDER — SODIUM CHLORIDE 0.9 % IJ SOLN
3.0000 mL | Freq: Two times a day (BID) | INTRAMUSCULAR | Status: DC
  Administered 2014-02-06 – 2014-02-07 (×2): 3 mL via INTRAVENOUS

## 2014-02-06 MED ORDER — LORAZEPAM 2 MG/ML IJ SOLN
1.0000 mg | Freq: Once | INTRAMUSCULAR | Status: AC
  Administered 2014-02-06: 1 mg via INTRAVENOUS
  Filled 2014-02-06: qty 1

## 2014-02-06 MED ORDER — METRONIDAZOLE IN NACL 5-0.79 MG/ML-% IV SOLN
500.0000 mg | Freq: Three times a day (TID) | INTRAVENOUS | Status: DC
  Administered 2014-02-06 – 2014-02-08 (×5): 500 mg via INTRAVENOUS
  Filled 2014-02-06 (×7): qty 100

## 2014-02-06 MED ORDER — IOHEXOL 300 MG/ML  SOLN
25.0000 mL | INTRAMUSCULAR | Status: AC
  Administered 2014-02-06 (×2): 25 mL via ORAL

## 2014-02-06 MED ORDER — IOHEXOL 300 MG/ML  SOLN
100.0000 mL | Freq: Once | INTRAMUSCULAR | Status: AC | PRN
Start: 2014-02-06 — End: 2014-02-06
  Administered 2014-02-06: 100 mL via INTRAVENOUS

## 2014-02-06 NOTE — Progress Notes (Signed)
Subjective: Patient has not had a recurrent seizure since admission. She is tolerating Keppra well at 500 mg twice a day. She is complaining of left headache but otherwise has no complaints. She was given Percocet which has helped.  Objective: Current vital signs: BP 170/72 mmHg  Pulse 102  Temp(Src) 98.9 F (37.2 C) (Oral)  Resp 16  Ht 5\' 5"  (1.651 m)  Wt 72.4 kg (159 lb 9.8 oz)  BMI 26.56 kg/m2  SpO2 96%  Neurologic Exam: Alert and in no acute distress. Patient was well-oriented to time as well as place.  Extraocular movements were full and conjugate. Face was symmetrical with no weakness. Speech was normal.  Medications: I have reviewed the patient's current medications.  Assessment/Plan: 64 year old lady with history of seizure disorder with recurrent seizures in the setting of noncompliance with taking anticonvulsant medication. She has had no recurrent seizure since treatment was with Keppra.  Recommend no changes in current management. Will need follow-up with her outpatient neurologist in [place] in 3-4 weeks. No further neurological intervention is indicated during her acute stay here.  C.R. Nicole Kindred, MD [facility] [phone]  02/06/2014  9:28 AM

## 2014-02-06 NOTE — Progress Notes (Deleted)
Subjective: Patient is complaining of continued pain in the lower extremities of 7/10 intensity. She has been ambulatory with assistance. Pain has improved since starting steroid treatments. She had no new complaints.  Objective: Current vital signs: BP 170/72 mmHg  Pulse 102  Temp(Src) 98.9 F (37.2 C) (Oral)  Resp 16  Ht 5\' 5"  (1.651 m)  Wt 72.4 kg (159 lb 9.8 oz)  BMI 26.56 kg/m2  SpO2 96%  Neurologic Exam: Alert and in no acute distress. Patient was well-oriented to time as well as place. Strength of her lower extremities was normal proximally and distally. She had considerably less tenderness of lower extremities to external pressure. Deep tendon reflexes were 2+ and symmetrical  Medications: I have reviewed the patient's current medications.  Assessment/Plan: Probable MS exacerbation in addition to chronic pain syndrome with fibromyalgia. Patient's length is improved and functional use of lower extremities is also improved.  Recommend continued physical therapy intervention in addition to bleeding her planned course of Medrol 500 mg every 12 hours for 6 doses.  We will continue to follow this patient with you.  C.R. Nicole Kindred, MD [facility] [phone]  02/06/2014  9:21 AM

## 2014-02-06 NOTE — Progress Notes (Signed)
ANTIBIOTIC CONSULT NOTE - INITIAL  Pharmacy Consult:  Cipro Indication:  Intra-abdominal infection  Allergies  Allergen Reactions  . Aspirin Adult Low [Aspirin]     Stomach cramps  . Erythromycin Nausea And Vomiting  . Stadol [Butorphanol] Other (See Comments)    Hallucinations  . Morphine And Related Rash    Patient Measurements: Height: 5\' 5"  (165.1 cm) Weight: 159 lb 9.8 oz (72.4 kg) IBW/kg (Calculated) : 57  Vital Signs: Temp: 98.6 F (37 C) (01/17 1612) Temp Source: Oral (01/17 1612) BP: 167/79 mmHg (01/17 1612) Intake/Output from previous day: 01/16 0701 - 01/17 0700 In: 1592.9 [P.O.:220; I.V.:1372.9] Out: 1750 [Urine:1750] Intake/Output from this shift: Total I/O In: 875 [I.V.:875] Out: 1700 [Urine:1700]  Labs:  Recent Labs  02/04/14 2301 02/05/14 0305 02/06/14 0600  WBC 10.5 10.4 6.8  HGB 13.6 13.2 12.1  PLT 329 335 277  CREATININE 0.86 0.71 0.54   Estimated Creatinine Clearance: 72.7 mL/min (by C-G formula based on Cr of 0.54). No results for input(s): VANCOTROUGH, VANCOPEAK, VANCORANDOM, GENTTROUGH, GENTPEAK, GENTRANDOM, TOBRATROUGH, TOBRAPEAK, TOBRARND, AMIKACINPEAK, AMIKACINTROU, AMIKACIN in the last 72 hours.   Microbiology: No results found for this or any previous visit (from the past 720 hour(s)).  Medical History: Past Medical History  Diagnosis Date  . Seizure   . Cancer     Breast Cancer  . Diabetes mellitus without complication   . Hypertension       Assessment: 40 YOF presented on 02/05/14 with seizure, nausea, vomiting and diarrhea.  CT showed left-sided colitis and patient to start Cipro and Flagyl for intra-abdominal infection.  Her renal function is stable.   Goal of Therapy:  Clearance of infection   Plan:  - Cipro 400mg  IV Q12H - Continue Flagyl 500mg  IV Q8H - Monitor renal fxn, clinical progress    Kammy Klett D. Mina Marble, PharmD, BCPS Pager:  [phone] 02/06/2014, 6:15 PM

## 2014-02-06 NOTE — Progress Notes (Signed)
[facility] Progress Note   Andrea [name] [id number] DOB: [birth year] DOA: 02/04/2014 PCP: Vickii Chafe, MD  Brief narrative: Andrea [name] is a 64 y.o. female with medical history of seizure, breast cancer, hypertension, hypothyroidism, diabetes mellitus, who presents with seizure, nausea, vomiting, diarrhea and found to have a hypertensive urgency.  Subjective: No longer vomiting but nauseated but continues to have abdominal discomfort.   Assessment/Plan: Principal Problem:   Seizure - cont Keppra - neuro assisting with management- MRI unremarkable  Active Problems:   Dehydration/ vomiting and diarrhea - cont IVF and supportive care- CT reveals left sided colitis- start Cipro and Flagyl    History of breast cancer - has breast reconstruction scheduled on Monday- will see if she can recover by then    Diabetes mellitus without complication - cont current dose of lantus but increase to 10 U QHS and cont sliding scale insluin    Hypothyroidism - cont synthroid  Code Status: Full Family Communication:  Disposition Plan:  DVT prophylaxis: Heparin  Consultants:  Procedures:  Antibiotics: Anti-infectives    Start     Dose/Rate Route Frequency Ordered Stop   02/06/14 1900  metroNIDAZOLE (FLAGYL) IVPB 500 mg     500 mg100 mL/hr over 60 Minutes Intravenous Every 8 hours 02/06/14 1738       Objective: Filed Weights   02/04/14 2132 02/06/14 0110  Weight: 70.308 kg (155 lb) 72.4 kg (159 lb 9.8 oz)    Intake/Output Summary (Last 24 hours) at 02/06/14 1745 Last data filed at 02/06/14 1500  Gross per 24 hour  Intake 2247.92 ml  Output   3450 ml  Net -1202.08 ml     Vitals Filed Vitals:   02/05/14 2257 02/06/14 0110 02/06/14 0606 02/06/14 1612  BP: 173/68  170/72 167/79  Pulse:      Temp: 99 F (37.2 C)  98.9 F (37.2 C) 98.6 F (37 C)  TempSrc: Oral  Oral Oral  Resp: 18  16 18   Height:      Weight:  72.4 kg (159 lb 9.8 oz)    SpO2: 97%  96% 97%     Exam: General: AAO x 3, No acute respiratory distress Lungs: Clear to auscultation bilaterally without wheezes or crackles Cardiovascular: Regular rate and rhythm without murmur gallop or rub normal S1 and S2 Abdomen: tender in left lower quadrant today,  nondistended, soft, bowel sounds positive, no rebound, no ascites, no appreciable mass Extremities: No significant cyanosis, clubbing, or edema bilateral lower extremities  Data Reviewed: Basic Metabolic Panel:  Recent Labs Lab 02/04/14 2301 02/05/14 0305 02/06/14 0600  NA 134* 137 136  K 4.4 3.9 3.4*  CL 96 99 102  CO2 21 31 29   GLUCOSE 416* 303* 181*  BUN 14 11 6   CREATININE 0.86 0.71 0.54  CALCIUM 10.2 9.7 9.0   Liver Function Tests:  Recent Labs Lab 02/04/14 2301 02/05/14 0305  AST 46* 33  ALT 37* 33  ALKPHOS 114 117  BILITOT 1.0 1.2  PROT 8.2 7.8  ALBUMIN 4.2 4.0   No results for input(s): LIPASE, AMYLASE in the last 168 hours. No results for input(s): AMMONIA in the last 168 hours. CBC:  Recent Labs Lab 02/04/14 2301 02/05/14 0305 02/06/14 0600  WBC 10.5 10.4 6.8  NEUTROABS 8.9*  --   --   HGB 13.6 13.2 12.1  HCT 38.9 37.6 34.5*  MCV 87.8 88.5 86.7  PLT 329 335 277   Cardiac Enzymes: No results for input(s): CKTOTAL, CKMB, CKMBINDEX,  TROPONINI in the last 168 hours. BNP (last 3 results) No results for input(s): PROBNP in the last 8760 hours. CBG:  Recent Labs Lab 02/05/14 0940 02/05/14 1721 02/05/14 2041 02/06/14 0857 02/06/14 1228  GLUCAP 219* 247* 185* 198* 208*    No results found for this or any previous visit (from the past 240 hour(s)).   Studies:  Recent x-ray studies have been reviewed in detail by the Attending Physician  Scheduled Meds:  Scheduled Meds: . heparin  5,000 Units Subcutaneous 3 times per day  . insulin aspart  0-9 Units Subcutaneous TID WC  . insulin glargine  10 Units Subcutaneous Daily  . levETIRAcetam  500 mg Oral BID  . levothyroxine  50 mcg Oral  QAC breakfast  . lisinopril  20 mg Oral Daily  . metronidazole  500 mg Intravenous Q8H  . sodium chloride  3 mL Intravenous Q12H  . sodium chloride  3 mL Intravenous Q12H   Continuous Infusions: . sodium chloride 125 mL/hr at 02/06/14 0007    Time spent on care of this patient: 64 min   Douglas, MD 02/06/2014, 5:45 PM  LOS: 2 days   [facility] Office  [phone] Pager - Text Page per [email or web address]  If 7PM-7AM, please contact night-coverage [email or web address]

## 2014-02-07 LAB — GLUCOSE, CAPILLARY
Glucose-Capillary: 185 mg/dL — ABNORMAL HIGH (ref 70–99)
Glucose-Capillary: 284 mg/dL — ABNORMAL HIGH (ref 70–99)

## 2014-02-07 LAB — HEMOGLOBIN A1C: Hgb A1c MFr Bld: 8 % — ABNORMAL HIGH (ref <5.7)

## 2014-02-07 MED ORDER — INSULIN ASPART 100 UNIT/ML ~~LOC~~ SOLN
0.0000 [IU] | Freq: Three times a day (TID) | SUBCUTANEOUS | Status: DC
  Administered 2014-02-07: 8 [IU] via SUBCUTANEOUS
  Administered 2014-02-08 (×2): 5 [IU] via SUBCUTANEOUS

## 2014-02-07 MED ORDER — OXYCODONE HCL 5 MG PO TABS
5.0000 mg | ORAL_TABLET | Freq: Once | ORAL | Status: AC
  Administered 2014-02-08: 5 mg via ORAL
  Filled 2014-02-07: qty 1

## 2014-02-07 MED ORDER — ENOXAPARIN SODIUM 40 MG/0.4ML ~~LOC~~ SOLN
40.0000 mg | SUBCUTANEOUS | Status: DC
  Administered 2014-02-07 – 2014-02-08 (×2): 40 mg via SUBCUTANEOUS
  Filled 2014-02-07 (×2): qty 0.4

## 2014-02-07 MED ORDER — ZOLPIDEM TARTRATE 5 MG PO TABS
5.0000 mg | ORAL_TABLET | Freq: Once | ORAL | Status: AC
  Administered 2014-02-08: 5 mg via ORAL
  Filled 2014-02-07: qty 1

## 2014-02-07 NOTE — Clinical Social Work Psychosocial (Signed)
Clinical Social Work Department BRIEF PSYCHOSOCIAL ASSESSMENT 02/07/2014  Patient:  Andrea [name], Andrea [name]     Account Number:  [id number]     Admit date:  02/04/2014  Clinical Social Worker:  Lovey Newcomer  Date/Time:  02/07/2014 12:48 PM  Referred by:  Physician  Date Referred:  02/07/2014 Referred for  SNF Placement   Other Referral:   NA   Interview type:  Patient Other interview type:   Patient alert and oriented at time of assessment.    PSYCHOSOCIAL DATA Living Status:  ALONE Admitted from facility:   Level of care:   Primary support name:  Gerald Stabs Primary support relationship to patient:  CHILD, ADULT Degree of support available:   Support is good.    CURRENT CONCERNS Current Concerns  Post-Acute Placement   Other Concerns:   NA    SOCIAL WORK ASSESSMENT / PLAN CSW met with patient at bedside to complete assessment. Patient states that she plans for return home at discharge and adamantly refusing any kind of placement. Patient states that she lives alone at home and her two sons are her main supports. Patient states that she would be open to [place]. Patient states she does not think she can leave the hospital today, as she doesn't feel "well". Patient also states that she cannot get home because she doesn't have a ride to her home or her keys. She states her son can provide her transportation home but states he can't do this today because of work. She also states that she does not have her keys to get into her home. Unfortunately patient is going to have to make arrangements herself to get her keys. If patient does require transport home, she will have to go by EMS transport.   Assessment/plan status:  No Further Intervention Required Other assessment/ plan:   NONE   Information/referral to community resources:   NONE    PATIENT'S/FAMILY'S RESPONSE TO PLAN OF CARE: Patient is adamantly refusing SNF placement. CSW signing off at this time.       Liz Beach MSW, [place], [place], [phone]
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
## 2014-02-07 NOTE — Progress Notes (Signed)
Physical Therapy Treatment Patient Details Name: Andrea [name] MRN: [id number] DOB: [birth year] Today's Date: 02/07/2014    History of Present Illness Pt is a 64 y.o. female with PMH of seizure, breast CA, HTN, hypothyroidism, DM, who presents with seizure, nausea, vomiting, diarrhea. Patient reports that she had hx of seizure and was on Depakote, which she stopped taking after she had breast cancer surgery about 4 months ago.     PT Comments    Patient making improvements with mobility and gait.  Improved safety with use of RW (patient has RW at home).  Patient did become nauseated and felt "lightheaded" after ambulation 220'.  Ended session.  BP 136/74.  RN notified patient nauseated. Patient refusing SNF as d/c plan.  Therefore, patient will need HHPT for continued therapy.  Would also recommend HH Aide to assist with ADL's.   Follow Up Recommendations  Home health PT;Supervision - Intermittent (Patient refusing SNF)     Equipment Recommendations  None recommended by PT    Recommendations for Other Services       Precautions / Restrictions Precautions Precautions: Fall Restrictions Weight Bearing Restrictions: No    Mobility  Bed Mobility Overal bed mobility: Independent                Transfers Overall transfer level: Modified independent Equipment used: Rolling walker (2 wheeled) Transfers: Sit to/from Stand Sit to Stand: Modified independent (Device/Increase time) Stand pivot transfers: Min guard       General transfer comment: Patient c/o headache on stance initially.  Good balance statically.  Ambulation/Gait Ambulation/Gait assistance: Supervision Ambulation Distance (Feet): 220 Feet Assistive device: Rolling walker (2 wheeled) Gait Pattern/deviations: Step-through pattern;Decreased stride length;Trunk flexed Gait velocity: Decreased Gait velocity interpretation: Below normal speed for age/gender General Gait Details: Verbal cues for safe use of RW.   Patient with safe use of RW and balance improved with gait with RW.  At end of ambulation, patient reported feeling "nauseated" and "lightheaded".  Patient sat in chair.  BP 136/74.  Lightheaded feeling decreased with sitting/supine.  Continued to feel nauseated - RN notified.   Stairs            Wheelchair Mobility    Modified Rankin (Stroke Patients Only)       Balance     Sitting balance-Leahy Scale: Good     Standing balance support: During functional activity;No upper extremity supported Standing balance-Leahy Scale: Fair                      Cognition Arousal/Alertness: Awake/alert Behavior During Therapy: WFL for tasks assessed/performed Overall Cognitive Status: Within Functional Limits for tasks assessed                      Exercises      General Comments        Pertinent Vitals/Pain Pain Assessment: 0-10 Pain Score: 5  Pain Location: Head Pain Descriptors / Indicators: Headache Pain Intervention(s): Monitored during session    Home Living Family/patient expects to be discharged to:: Private residence Living Arrangements: Alone Available Help at Discharge: Family;Available PRN/intermittently (family can assist 2x/week) Type of Home: Apartment ([place] home) Home Access: Stairs to enter Entrance Stairs-Rails: None Home Layout: Two level;Able to live on main level with bedroom/bathroom (half ath downstairs) Home Equipment: Walker - 2 wheels;Bedside commode;Shower seat      Prior Function Level of Independence: Independent          PT Goals (current goals  can now be found in the care plan section) Acute Rehab PT Goals Patient Stated Goal: Return home when independent.  Progress towards PT goals: Progressing toward goals    Frequency  Min 3X/week    PT Plan Discharge plan needs to be updated;Frequency needs to be updated    Co-evaluation             End of Session Equipment Utilized During Treatment: Gait  belt Activity Tolerance: Patient tolerated treatment well;Treatment limited secondary to medical complications (Comment) (Became lightheaded/nauseated - ended session) Patient left: in bed;with call bell/phone within reach;with bed alarm set     Time: [id number] PT Time Calculation (min) (ACUTE ONLY): 24 min  Charges:  $Gait Training: 23-37 mins                    G Codes:      Despina Pole 02/28/2014, 3:07 PM Carita Pian. Sanjuana Kava, [place] Pager [phone]

## 2014-02-07 NOTE — Progress Notes (Signed)
[facility] Progress Note   Andrea [name] [id number] DOB: 1950/10/27 DOA: 02/04/2014 PCP: Vickii Chafe, MD  Brief narrative: Andrea [name] is a 64 y.o. female with medical history of seizure, breast cancer, hypertension, hypothyroidism, diabetes mellitus, who presents with seizure, nausea, vomiting, diarrhea and found to have a hypertensive urgency.  Subjective: Continues to have abdominal pain. Feels ready to have diet advanced.   Assessment/Plan: Principal Problem:   Seizure - cont Keppra - neuro assisting with management- MRI unremarkable  Active Problems:   Dehydration/ vomiting and diarrhea - cont IVF and supportive care- CT reveals left sided colitis- started Cipro and Flagyl - advance diet    History of breast cancer - has breast reconstruction scheduled on Monday- will see if she can recover by then    Diabetes mellitus without complication - cont current dose of lantus but increased to 10 U QHS and cont sliding scale insluin    Hypothyroidism - cont synthroid  Code Status: Full Family Communication:  Disposition Plan:  DVT prophylaxis: Heparin  Consultants:  Procedures:  Antibiotics: Anti-infectives    Start     Dose/Rate Route Frequency Ordered Stop   02/06/14 1900  metroNIDAZOLE (FLAGYL) IVPB 500 mg     500 mg100 mL/hr over 60 Minutes Intravenous Every 8 hours 02/06/14 1738     02/06/14 1900  ciprofloxacin (CIPRO) IVPB 400 mg     400 mg200 mL/hr over 60 Minutes Intravenous Every 12 hours 02/06/14 1816       Objective: Filed Weights   02/04/14 2132 02/06/14 0110 02/07/14 0558  Weight: 70.308 kg (155 lb) 72.4 kg (159 lb 9.8 oz) 64.728 kg (142 lb 11.2 oz)    Intake/Output Summary (Last 24 hours) at 02/07/14 1217 Last data filed at 02/07/14 0930  Gross per 24 hour  Intake    875 ml  Output   2655 ml  Net  -1780 ml     Vitals Filed Vitals:   02/06/14 1612 02/06/14 2217 02/07/14 0543 02/07/14 0558  BP: 167/79 197/84 151/67   Pulse:   86 92   Temp: 98.6 F (37 C) 98 F (36.7 C) 98.7 F (37.1 C)   TempSrc: Oral Oral Oral   Resp: 18 15 20    Height:      Weight:    64.728 kg (142 lb 11.2 oz)  SpO2: 97% 97% 97%     Exam: General: AAO x 3, No acute respiratory distress Lungs: Clear to auscultation bilaterally without wheezes or crackles Cardiovascular: Regular rate and rhythm without murmur gallop or rub normal S1 and S2 Abdomen: tender in left lower quadrant today,  nondistended, soft, bowel sounds positive, no rebound, no ascites, no appreciable mass Extremities: No significant cyanosis, clubbing, or edema bilateral lower extremities  Data Reviewed: Basic Metabolic Panel:  Recent Labs Lab 02/04/14 2301 02/05/14 0305 02/06/14 0600  NA 134* 137 136  K 4.4 3.9 3.4*  CL 96 99 102  CO2 21 31 29   GLUCOSE 416* 303* 181*  BUN 14 11 6   CREATININE 0.86 0.71 0.54  CALCIUM 10.2 9.7 9.0   Liver Function Tests:  Recent Labs Lab 02/04/14 2301 02/05/14 0305  AST 46* 33  ALT 37* 33  ALKPHOS 114 117  BILITOT 1.0 1.2  PROT 8.2 7.8  ALBUMIN 4.2 4.0   No results for input(s): LIPASE, AMYLASE in the last 168 hours. No results for input(s): AMMONIA in the last 168 hours. CBC:  Recent Labs Lab 02/04/14 2301 02/05/14 0305 02/06/14 0600  WBC  10.5 10.4 6.8  NEUTROABS 8.9*  --   --   HGB 13.6 13.2 12.1  HCT 38.9 37.6 34.5*  MCV 87.8 88.5 86.7  PLT 329 335 277   Cardiac Enzymes: No results for input(s): CKTOTAL, CKMB, CKMBINDEX, TROPONINI in the last 168 hours. BNP (last 3 results) No results for input(s): PROBNP in the last 8760 hours. CBG:  Recent Labs Lab 02/06/14 0857 02/06/14 1228 02/06/14 1757 02/06/14 2211 02/07/14 0940  GLUCAP 198* 208* 156* 221* 185*    No results found for this or any previous visit (from the past 240 hour(s)).   Studies:  Recent x-ray studies have been reviewed in detail by the Attending Physician  Scheduled Meds:  Scheduled Meds: . ciprofloxacin  400 mg  Intravenous Q12H  . enoxaparin (LOVENOX) injection  40 mg Subcutaneous Q24H  . insulin aspart  0-9 Units Subcutaneous TID WC  . insulin glargine  10 Units Subcutaneous Daily  . levETIRAcetam  500 mg Oral BID  . levothyroxine  50 mcg Oral QAC breakfast  . lisinopril  20 mg Oral Daily  . metronidazole  500 mg Intravenous Q8H  . sodium chloride  3 mL Intravenous Q12H  . sodium chloride  3 mL Intravenous Q12H   Continuous Infusions: . sodium chloride 1,000 mL (02/06/14 2025)    Time spent on care of this patient: 42 min   Beulah [place], MD 02/07/2014, 12:17 PM  LOS: 3 days   [facility] Office  [phone] Pager - Text Page per [email or web address]  If 7PM-7AM, please contact night-coverage [email or web address]

## 2014-02-08 DIAGNOSIS — R100 Acute abdomen: Secondary | ICD-10-CM

## 2014-02-08 LAB — GLUCOSE, CAPILLARY: Glucose-Capillary: 204 mg/dL — ABNORMAL HIGH (ref 70–99)

## 2014-02-08 LAB — CLOSTRIDIUM DIFFICILE BY PCR: Toxigenic C. Difficile by PCR: NEGATIVE

## 2014-02-08 MED ORDER — INSULIN GLARGINE 100 UNIT/ML ~~LOC~~ SOLN: 10.0000 [IU] | Freq: Every day | SUBCUTANEOUS | Status: AC

## 2014-02-08 MED ORDER — CIPROFLOXACIN HCL 500 MG PO TABS
500.0000 mg | ORAL_TABLET | Freq: Two times a day (BID) | ORAL | Status: DC
  Filled 2014-02-08 (×2): qty 1

## 2014-02-08 MED ORDER — LEVETIRACETAM 500 MG PO TABS: 500.0000 mg | ORAL_TABLET | Freq: Two times a day (BID) | ORAL | Status: AC

## 2014-02-08 MED ORDER — BISACODYL 10 MG RE SUPP
10.0000 mg | Freq: Once | RECTAL | Status: AC
  Administered 2014-02-08: 10 mg via RECTAL
  Filled 2014-02-08: qty 1

## 2014-02-08 MED ORDER — METRONIDAZOLE 500 MG PO TABS
500.0000 mg | ORAL_TABLET | Freq: Three times a day (TID) | ORAL | Status: DC
  Administered 2014-02-08: 500 mg via ORAL
  Filled 2014-02-08 (×3): qty 1

## 2014-02-08 MED ORDER — CIPROFLOXACIN HCL 500 MG PO TABS: 500.0000 mg | ORAL_TABLET | Freq: Two times a day (BID) | ORAL | Status: AC

## 2014-02-08 MED ORDER — METRONIDAZOLE 500 MG PO TABS: 500.0000 mg | ORAL_TABLET | Freq: Three times a day (TID) | ORAL | Status: AC
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
## 2014-02-08 NOTE — Progress Notes (Signed)
Patient discharge teaching given, including activity, diet, follow-up appoints, and medications. Patient verbalized understanding of all discharge instructions. IV access was d/c'd. Vitals are stable. Skin is intact except as charted in most recent assessments. Pt to be escorted out by NT, to be driven home by family. 

## 2014-02-08 NOTE — Clinical Social Work Note (Addendum)
BSW intern met with patient and provided her with clothing.   Kingsley Spittle, [place] Intern, [phone]

## 2014-02-09 NOTE — Care Management Note (Addendum)
    Page 1 of 2   02/11/2014     1:35:49 PM CARE MANAGEMENT NOTE 02/11/2014  Patient:  Andrea [name], Andrea [name]   Account Number:  [id number]  Date Initiated:  02/07/2014  Documentation initiated by:  Tomi Bamberger  Subjective/Objective Assessment:   dx colitis  admit- lives alone.     Action/Plan:   pt eval- rec hhpt.   Anticipated DC Date:  02/08/2014   Anticipated DC Plan:  [place]  CM consult      [facility] Choice  HOME HEALTH   Choice offered to / List presented to:  C-1 Patient        [place] arranged  [place].   Status of service:  Completed, signed off Medicare Important Message given?  NO (If response is "NO", the following Medicare IM given date fields will be blank) Date Medicare IM given:   Medicare IM given by:   Date Additional Medicare IM given:   Additional Medicare IM given by:    Discharge Disposition:  [place]  Per UR Regulation:  Reviewed for med. necessity/level of care/duration of stay  If discussed at [place] of Stay Meetings, dates discussed:    Comments:  02/11/13 [place], BSN [phone] NCM received call from [place] with [facility] , stating she was notified that [facility] has tried to see patient multiple times since she was discharged and they have not been able to contact her. They will not try to contact her unless patient calls them.  Patient has done this to them in the past as well per [place].  02/08/14 Tomi Bamberger RN, BSN [phone] patient for dc today and she chose [facility] for HHPT, aide and Education officer, museum.  Referral given to [place] notified on 02/07/14.  Soc will begin 24-48 hrs post dc.

## 2014-06-13 ENCOUNTER — Other Ambulatory Visit: Payer: Self-pay | Admitting: Internal Medicine

## 2014-11-10 ENCOUNTER — Encounter (HOSPITAL_COMMUNITY): Payer: Self-pay | Admitting: Emergency Medicine

## 2014-11-10 ENCOUNTER — Emergency Department (HOSPITAL_COMMUNITY)
Admission: EM | Admit: 2014-11-10 | Discharge: 2014-11-10 | Disposition: A | Discharge status: Home / Self Care | Payer: Medicare PPO | Attending: Emergency Medicine | Admitting: Emergency Medicine

## 2014-11-10 DIAGNOSIS — E1165 Type 2 diabetes mellitus with hyperglycemia: Secondary | ICD-10-CM | POA: Diagnosis not present

## 2014-11-10 DIAGNOSIS — Z90710 Acquired absence of both cervix and uterus: Secondary | ICD-10-CM | POA: Insufficient documentation

## 2014-11-10 DIAGNOSIS — R519 Headache, unspecified: Secondary | ICD-10-CM

## 2014-11-10 DIAGNOSIS — M545 Low back pain: Secondary | ICD-10-CM | POA: Insufficient documentation

## 2014-11-10 DIAGNOSIS — Z79899 Other long term (current) drug therapy: Secondary | ICD-10-CM | POA: Insufficient documentation

## 2014-11-10 DIAGNOSIS — R112 Nausea with vomiting, unspecified: Secondary | ICD-10-CM | POA: Insufficient documentation

## 2014-11-10 DIAGNOSIS — R739 Hyperglycemia, unspecified: Secondary | ICD-10-CM

## 2014-11-10 DIAGNOSIS — Z9049 Acquired absence of other specified parts of digestive tract: Secondary | ICD-10-CM | POA: Diagnosis not present

## 2014-11-10 DIAGNOSIS — R350 Frequency of micturition: Secondary | ICD-10-CM | POA: Insufficient documentation

## 2014-11-10 DIAGNOSIS — Z853 Personal history of malignant neoplasm of breast: Secondary | ICD-10-CM | POA: Diagnosis not present

## 2014-11-10 DIAGNOSIS — G43909 Migraine, unspecified, not intractable, without status migrainosus: Secondary | ICD-10-CM | POA: Diagnosis not present

## 2014-11-10 DIAGNOSIS — G40909 Epilepsy, unspecified, not intractable, without status epilepticus: Secondary | ICD-10-CM | POA: Insufficient documentation

## 2014-11-10 DIAGNOSIS — Z794 Long term (current) use of insulin: Secondary | ICD-10-CM | POA: Diagnosis not present

## 2014-11-10 DIAGNOSIS — R1032 Left lower quadrant pain: Secondary | ICD-10-CM | POA: Diagnosis not present

## 2014-11-10 DIAGNOSIS — I10 Essential (primary) hypertension: Secondary | ICD-10-CM | POA: Diagnosis not present

## 2014-11-10 LAB — BASIC METABOLIC PANEL
Anion gap: 8 (ref 5–15)
Calcium: 9.4 mg/dL (ref 8.9–10.3)
GFR calc Af Amer: >60 mL/min (ref >60)
GFR calc non Af Amer: >60 mL/min (ref >60)
Glucose, Bld: 374 mg/dL — ABNORMAL HIGH (ref 65–99)
Potassium: 4.1 mmol/L (ref 3.5–5.1)
Sodium: 135 mmol/L (ref 135–145)

## 2014-11-10 LAB — CBG MONITORING, ED
Glucose-Capillary: 350 mg/dL — ABNORMAL HIGH (ref 65–99)
Glucose-Capillary: 247 mg/dL — ABNORMAL HIGH (ref 65–99)

## 2014-11-10 LAB — URINALYSIS, ROUTINE W REFLEX MICROSCOPIC
Bilirubin Urine: NEGATIVE
Glucose, UA: >1000 mg/dL — AB
Hgb urine dipstick: NEGATIVE
Protein, ur: NEGATIVE mg/dL
Urobilinogen, UA: 1 mg/dL (ref 0.0–1.0)

## 2014-11-10 LAB — CBC
Hemoglobin: 13.8 g/dL (ref 12.0–15.0)
MCHC: 35.7 g/dL (ref 30.0–36.0)
MCV: 90.2 fL (ref 78.0–100.0)
Platelets: 312 10*3/uL (ref 150–400)
RBC: 4.29 MIL/uL (ref 3.87–5.11)
RDW: 12.3 % (ref 11.5–15.5)
WBC: 8.1 10*3/uL (ref 4.0–10.5)

## 2014-11-10 LAB — URINE MICROSCOPIC-ADD ON

## 2014-11-10 MED ORDER — DEXAMETHASONE SODIUM PHOSPHATE 10 MG/ML IJ SOLN
10.0000 mg | Freq: Once | INTRAMUSCULAR | Status: AC
  Administered 2014-11-10: 10 mg via INTRAMUSCULAR
  Filled 2014-11-10: qty 1

## 2014-11-10 MED ORDER — METOCLOPRAMIDE HCL 5 MG/ML IJ SOLN
10.0000 mg | Freq: Once | INTRAMUSCULAR | Status: AC
  Administered 2014-11-10: 10 mg via INTRAVENOUS
  Filled 2014-11-10: qty 2

## 2014-11-10 MED ORDER — DEXAMETHASONE SODIUM PHOSPHATE 10 MG/ML IJ SOLN
10.0000 mg | Freq: Once | INTRAMUSCULAR | Status: DC
  Filled 2014-11-10: qty 1

## 2014-11-10 MED ORDER — MAGNESIUM SULFATE 2 GM/50ML IV SOLN
2.0000 g | Freq: Once | INTRAVENOUS | Status: DC
  Filled 2014-11-10: qty 50

## 2014-11-10 MED ORDER — DIPHENHYDRAMINE HCL 50 MG/ML IJ SOLN
25.0000 mg | Freq: Once | INTRAMUSCULAR | Status: AC
  Administered 2014-11-10: 25 mg via INTRAVENOUS
  Filled 2014-11-10: qty 1

## 2014-11-10 MED ORDER — FLUCONAZOLE 150 MG PO TABS: 150.0000 mg | ORAL_TABLET | Freq: Once | ORAL | Status: AC

## 2014-11-10 MED ORDER — SODIUM CHLORIDE 0.9 % IV BOLUS (SEPSIS)
1000.0000 mL | Freq: Once | INTRAVENOUS | Status: AC
  Administered 2014-11-10: 1000 mL via INTRAVENOUS

## 2014-11-10 NOTE — ED Provider Notes (Signed)
CSN: [id number]     Arrival date & time 11/10/14  0221 History  By signing my name below, I, Andrea [name], attest that this documentation has been prepared under the direction and in the presence of Andrea Porter, MD [phone]. Electronically Signed: Helane [name], ED Scribe. 11/10/2014. 3:55 AM.    Chief Complaint  Patient presents with  . Hyperglycemia   The history is provided by the patient. No language interpreter was used.   HPI Comments: Andrea [name] is a 64 y.o. female with a PMHx of breast cancer, DM, migraines, and HTN brought in by ambulance, who presents to the Emergency Department complaining of hyperglycemia onset around [place]. Pt states her blood sugar was registering high, so she took 50 units of Lantus. She notes she normally takes her medication with breakfast, but notes she did not eat breakfast or take the medication today. She states her blood sugar registered at 500 at about 12:30 AM, at which time she states she called EMS. She reports associated left-sided, throbbing HA, back pain, frequency (getting up at least 3x at night), nausea, vomiting (1x yesterday), blurry vision, fever (Tmax 99.7, last week, resolved), and left-sided abdominal pain (onset a few weeks ago, exacerbated with urinating). She also complains of a yeast infection in discomfort in her groin despite taking over-the-counter yeast medications. We discussed that she will have yeast infections as long as her diabetes is not controlled. She states this is the highest her blood sugar has ever been, and notes previous maximum values have been in the 400's. However she states her A1C is 9. ver she states she takes her insulin regularly. She does not use a sliding scale. She notes she had a yeast infection for the past few weeks which she is currently treating with monistat. She notes she has had a port-a-cath for the past 15 months. She states she will meet with her surgeon next week to discuss whether to take the port out. Pt  states she lives alone. She denies smoking or drinking.   PCP [name]  Past Medical History  Diagnosis Date  . Seizure ([place])   . Cancer [facility])     Breast Cancer  . Diabetes mellitus without complication ([place])   . Hypertension   . Migraine    Past Surgical History  Procedure Laterality Date  . Tonsillectomy    . Appendectomy    . Mastectomy    . Abdominal hysterectomy    . Abdominal surgery     Family History  Problem Relation Age of Onset  . Breast cancer Mother    Social History  Substance Use Topics  . Smoking status: Never Smoker   . Smokeless tobacco: None  . Alcohol Use: No  lives at home Lives alone   [place] History    No data available     Review of Systems  Constitutional: Negative for fever.  Eyes: Positive for visual disturbance.  Gastrointestinal: Positive for nausea, vomiting and abdominal pain.  Genitourinary: Positive for frequency.  Musculoskeletal: Positive for back pain.  Neurological: Positive for headaches.  All other systems reviewed and are negative.   Allergies  Acetaminophen; Aspirin adult low; Erythromycin; Other; Stadol; Toradol; and Morphine and related  Home Medications   Prior to Admission medications   Medication Sig Start Date End Date Taking? Authorizing Provider  ALPRAZolam Duanne Moron) 0.5 MG tablet Take 0.5 mg by mouth 3 (three) times daily as needed for anxiety.   Yes Historical Provider, MD  divalproex (DEPAKOTE ER)  500 MG 24 hr tablet Take 1,000 mg by mouth daily.   Yes Historical Provider, MD  GABAPENTIN PO Take 1 capsule by mouth 3 (three) times daily.    Yes Historical Provider, MD  insulin glargine (LANTUS) 100 UNIT/ML injection Inject 0.1 mLs (10 Units total) into the skin daily. Patient taking differently: Inject 50 Units into the skin daily.  02/08/14  Yes Debbe Odea, MD  levothyroxine (SYNTHROID, LEVOTHROID) 50 MCG tablet Take 50 mcg by mouth daily before breakfast.   Yes Historical Provider, MD  lisinopril  (PRINIVIL,ZESTRIL) 20 MG tablet Take 20 mg by mouth daily.   Yes Historical Provider, MD  miconazole (MONISTAT 7) 2 % vaginal cream Place 1 Applicatorful vaginally at bedtime.   Yes Historical Provider, MD  oxyCODONE-acetaminophen (PERCOCET/ROXICET) 5-325 MG per tablet Take 1 tablet by mouth every 6 (six) hours as needed for moderate pain or severe pain.   Yes Historical Provider, MD  promethazine (PHENERGAN) 25 MG tablet Take 25 mg by mouth every 6 (six) hours as needed for nausea or vomiting.   Yes Historical Provider, MD  zolpidem (AMBIEN) 10 MG tablet Take 10 mg by mouth at bedtime as needed for sleep.   Yes Historical Provider, MD  ciprofloxacin (CIPRO) 500 MG tablet Take 1 tablet (500 mg total) by mouth 2 (two) times daily. Patient not taking: Reported on 11/10/2014 02/08/14   Debbe Odea, MD  levETIRAcetam (KEPPRA) 500 MG tablet Take 1 tablet (500 mg total) by mouth 2 (two) times daily. Patient not taking: Reported on 11/10/2014 02/08/14   Debbe Odea, MD  metroNIDAZOLE (FLAGYL) 500 MG tablet Take 1 tablet (500 mg total) by mouth every 8 (eight) hours. Patient not taking: Reported on 11/10/2014 02/08/14   Debbe Odea, MD   BP 190/92 mmHg  Pulse 95  Temp(Src) 98.8 F (37.1 C) (Oral)  Resp 14  SpO2 94%  Vital signs normal except hyperglycemia  Physical Exam  Constitutional: She is oriented to person, place, and time. She appears well-developed and well-nourished.  Non-toxic appearance. She does not appear ill. No distress.  HENT:  Head: Normocephalic and atraumatic.  Right Ear: External ear normal.  Left Ear: External ear normal.  Nose: Nose normal. No mucosal edema or rhinorrhea.  Mouth/Throat: Oropharynx is clear and moist and mucous membranes are normal. No dental abscesses or uvula swelling.  Eyes: Conjunctivae and EOM are normal. Pupils are equal, round, and reactive to light.  Neck: Normal range of motion and full passive range of motion without pain. Neck supple.   Cardiovascular: Normal rate, regular rhythm and normal heart sounds.  Exam reveals no gallop and no friction rub.   No murmur heard. Pulmonary/Chest: Effort normal and breath sounds normal. No respiratory distress. She has no wheezes. She has no rhonchi. She has no rales. She exhibits no tenderness and no crepitus.  Abdominal: Soft. Normal appearance and bowel sounds are normal. She exhibits no distension. There is tenderness. There is no rebound and no guarding.  Mild LLQ TTP  Musculoskeletal: Normal range of motion. She exhibits no edema or tenderness.  Moves all extremities well.   Neurological: She is alert and oriented to person, place, and time. She has normal strength. No cranial nerve deficit.  Skin: Skin is warm, dry and intact. No rash noted. No erythema. No pallor.  Psychiatric: She has a normal mood and affect. Her speech is normal and behavior is normal. Her mood appears not anxious.  Nursing note and vitals reviewed.   ED Course  Procedures   Medications  magnesium sulfate IVPB 2 g 50 mL (not administered)  sodium chloride 0.9 % bolus 1,000 mL (0 mLs Intravenous Stopped 11/10/14 0519)  metoCLOPramide (REGLAN) injection 10 mg (10 mg Intravenous Given 11/10/14 0408)  diphenhydrAMINE (BENADRYL) injection 25 mg (25 mg Intravenous Given 11/10/14 0409)  dexamethasone (DECADRON) injection 10 mg (10 mg Intramuscular Given 11/10/14 0640)    DIAGNOSTIC STUDIES: Oxygen Saturation is 94% on RA, low by my interpretation.    COORDINATION OF CARE: 3:53 AM - Discussed plans to order a migraine cocktail. Pt advised of plan for treatment and pt agrees.  Patient was given IV fluids which will help her hyperglycemia and her headache. She was given IV Reglan and Benadryl.  Patient CBG improved to 247 just with IV fluids. However when she was rechecked she states her headache wasn't any better. She told nursing staff her PCP normally gets her Demerol and Phenergan. I had a discussion with  the patient that we do not treat headaches in the ER with narcotics anymore. That I would be glad to continue with that migraine cocktail medications.  Nursing staff states patient's IV infiltrated prior to her getting her IV magnesium. She was given Decadron IM.  Patient was rechecked at time of discharge. She states she does not expect her headache to go away right away as it has been effort 3 days. She states she feels well enough that she can go home.  Labs Review Results for orders placed or performed during the hospital encounter of 0000000  Basic metabolic panel  Result Value Ref Range   Sodium 135 135 - 145 mmol/L   Potassium 4.1 3.5 - 5.1 mmol/L   Chloride 100 (L) 101 - 111 mmol/L   CO2 27 22 - 32 mmol/L   Glucose, Bld 374 (H) 65 - 99 mg/dL   BUN 12 6 - 20 mg/dL   Creatinine, Ser 0.73 0.44 - 1.00 mg/dL   Calcium 9.4 8.9 - 10.3 mg/dL   GFR calc non Af Amer >60 >60 mL/min   GFR calc Af Amer >60 >60 mL/min   Anion gap 8 5 - 15  CBC  Result Value Ref Range   WBC 8.1 4.0 - 10.5 K/uL   RBC 4.29 3.87 - 5.11 MIL/uL   Hemoglobin 13.8 12.0 - 15.0 g/dL   HCT 38.7 36.0 - 46.0 %   MCV 90.2 78.0 - 100.0 fL   MCH 32.2 26.0 - 34.0 pg   MCHC 35.7 30.0 - 36.0 g/dL   RDW 12.3 11.5 - 15.5 %   Platelets 312 150 - 400 K/uL  Urinalysis, Routine w reflex microscopic (not at [facility])  Result Value Ref Range   Color, Urine YELLOW YELLOW   APPearance CLEAR CLEAR   Specific Gravity, Urine 1.042 (H) 1.005 - 1.030   pH 7.0 5.0 - 8.0   Glucose, UA >1000 (A) NEGATIVE mg/dL   Hgb urine dipstick NEGATIVE NEGATIVE   Bilirubin Urine NEGATIVE NEGATIVE   Ketones, ur NEGATIVE NEGATIVE mg/dL   Protein, ur NEGATIVE NEGATIVE mg/dL   Urobilinogen, UA 1.0 0.0 - 1.0 mg/dL   Nitrite NEGATIVE NEGATIVE   Leukocytes, UA NEGATIVE NEGATIVE  Urine microscopic-add on  Result Value Ref Range   Squamous Epithelial / LPF RARE RARE   WBC, UA 3-6 <3 WBC/hpf   Urine-Other RARE YEAST   CBG monitoring, ED  Result  Value Ref Range   Glucose-Capillary 350 (H) 65 - 99 mg/dL  CBG monitoring, ED  Result Value  Ref Range   Glucose-Capillary 247 (H) 65 - 99 mg/dL   Laboratory interpretation all normal except glucosuria and improving hyperglycemia     Imaging Review No results found. I have personally reviewed and evaluated these images and lab results as part of my medical decision-making.   EKG Interpretation None      MDM   Final diagnoses:  Hyperglycemia  Headache disorder    New Prescriptions   FLUCONAZOLE (DIFLUCAN) 150 MG TABLET    Take 1 tablet (150 mg total) by mouth once.    Plan discharge  Andrea Porter, MD, FACEP    I personally performed the services described in this documentation, which was scribed in my presence. The recorded information has been reviewed and considered.  Andrea Porter, MD, Barbette Or, MD 11/10/14 [phone]

## 2014-11-10 NOTE — ED Notes (Signed)
MD at bedside. 

## 2014-11-10 NOTE — Discharge Instructions (Signed)
Monitor your blood glucose levels. You need to take your insulin every day unless your glucose gets too low. Go home and rest. Follow up with your doctor if you continue to have your headache.  Hyperglycemia High blood sugar (hyperglycemia) means that the level of sugar in your blood is higher than it should be. Signs of high blood sugar include:  Feeling thirsty.  Frequent peeing (urinating).  Feeling tired or sleepy.  Dry mouth.  Vision changes.  Feeling weak.  Feeling hungry but losing weight.  Numbness and tingling in your hands or feet.  Headache. When you ignore these signs, your blood sugar may keep going up. These problems may get worse, and other problems may begin. HOME CARE  Check your blood sugars as told by your doctor. Write down the numbers with the date and time.  Take the right amount of insulin or diabetes pills at the right time. Write down the dose with date and time.  Refill your insulin or diabetes pills before running out.  Watch what you eat. Follow your meal plan.  Drink liquids without sugar, such as water. Check with your doctor if you have kidney or heart disease.  Follow your doctor's orders for exercise. Exercise at the same time of day.  Keep your doctor's appointments. GET HELP RIGHT AWAY IF:   You have trouble thinking or are confused.  You have fast breathing with fruity smelling breath.  You pass out (faint).  You have 2 to 3 days of high blood sugars and you do not know why.  You have chest pain.  You are feeling sick to your stomach (nauseous) or throwing up (vomiting).  You have sudden vision changes. MAKE SURE YOU:   Understand these instructions.  Will watch your condition.  Will get help right away if you are not doing well or get worse.   This information is not intended to replace advice given to you by your health care provider. Make sure you discuss any questions you have with your health care provider.     Document Released: 11/04/2008 Document Revised: 01/28/2014 Document Reviewed: 09/13/2014 Elsevier Interactive Patient Education [employer].

## 2014-11-10 NOTE — ED Notes (Signed)
RN is drawing blood work

## 2014-11-10 NOTE — ED Notes (Signed)
Bed: [id number] Expected date:  Expected time:  Means of arrival:  Comments: EMS F CBG 432 and UTI

## 2014-11-10 NOTE — ED Notes (Addendum)
Per EMS, patient from home c/o for hyperglycemia. CBG 432 with EMS. Patient also c/o possible UTI or yeast infection. Patient states she was taking Monostat for this at home. Patient reports both problems have been reading "high" or ">500" for the past week. Patient reports to EMS she took insulin @ 45 minutes ago. Patient A&Ox4.   Patient reports she took 50units of Lantus. Patient states her normal dose of insulin is 30-50units at bedtime. Patient states she has a hx of seizures, typically brought on by high stress situations, last seizure was in January. Patient states she has strong odor to her urine with increased frequency and urgency x3 weeks. Patient also c/o vaginal itching and in her pelvic area, patient states she has used monostat cream and 3 suppositories without relief x3 weeks as well. Patient states symptoms will intermittently improve for a couple days and then it come back. Patient states she does drink a lot of water.

## 2014-11-10 NOTE — ED Notes (Signed)
IV team at bedside 

## 2014-11-10 NOTE — ED Notes (Signed)
Patient states she is feeling "weird". When asked to expand patient states she feels nervous and jittery and that her legs feel funny. Patient has been ambulatory to restroom since these symptoms started. MD notified.

## 2015-02-15 DIAGNOSIS — R109 Unspecified abdominal pain: Secondary | ICD-10-CM | POA: Insufficient documentation

## 2015-02-22 ENCOUNTER — Encounter (HOSPITAL_COMMUNITY): Payer: Self-pay

## 2015-02-22 ENCOUNTER — Emergency Department (HOSPITAL_COMMUNITY)
Admission: EM | Admit: 2015-02-22 | Discharge: 2015-02-22 | Disposition: A | Discharge status: Home / Self Care | Payer: Medicare Other | Attending: Emergency Medicine | Admitting: Emergency Medicine

## 2015-02-22 DIAGNOSIS — Z8719 Personal history of other diseases of the digestive system: Secondary | ICD-10-CM | POA: Diagnosis not present

## 2015-02-22 DIAGNOSIS — R197 Diarrhea, unspecified: Secondary | ICD-10-CM | POA: Insufficient documentation

## 2015-02-22 DIAGNOSIS — Z79899 Other long term (current) drug therapy: Secondary | ICD-10-CM | POA: Insufficient documentation

## 2015-02-22 DIAGNOSIS — Z7952 Long term (current) use of systemic steroids: Secondary | ICD-10-CM | POA: Insufficient documentation

## 2015-02-22 DIAGNOSIS — E039 Hypothyroidism, unspecified: Secondary | ICD-10-CM | POA: Insufficient documentation

## 2015-02-22 DIAGNOSIS — E1165 Type 2 diabetes mellitus with hyperglycemia: Secondary | ICD-10-CM | POA: Diagnosis present

## 2015-02-22 DIAGNOSIS — Z853 Personal history of malignant neoplasm of breast: Secondary | ICD-10-CM | POA: Diagnosis not present

## 2015-02-22 DIAGNOSIS — G43909 Migraine, unspecified, not intractable, without status migrainosus: Secondary | ICD-10-CM | POA: Diagnosis not present

## 2015-02-22 DIAGNOSIS — Z794 Long term (current) use of insulin: Secondary | ICD-10-CM | POA: Diagnosis not present

## 2015-02-22 DIAGNOSIS — I10 Essential (primary) hypertension: Secondary | ICD-10-CM | POA: Insufficient documentation

## 2015-02-22 DIAGNOSIS — Z792 Long term (current) use of antibiotics: Secondary | ICD-10-CM | POA: Insufficient documentation

## 2015-02-22 DIAGNOSIS — G40909 Epilepsy, unspecified, not intractable, without status epilepticus: Secondary | ICD-10-CM | POA: Insufficient documentation

## 2015-02-22 DIAGNOSIS — E1149 Type 2 diabetes mellitus with other diabetic neurological complication: Secondary | ICD-10-CM | POA: Insufficient documentation

## 2015-02-22 DIAGNOSIS — R739 Hyperglycemia, unspecified: Secondary | ICD-10-CM

## 2015-02-22 LAB — URINALYSIS, ROUTINE W REFLEX MICROSCOPIC
BILIRUBIN URINE: NEGATIVE
Glucose, UA: >1000 mg/dL — AB
HGB URINE DIPSTICK: NEGATIVE
Ketones, ur: NEGATIVE mg/dL
Leukocytes, UA: NEGATIVE
Nitrite: NEGATIVE
PROTEIN: NEGATIVE mg/dL
Specific Gravity, Urine: 1.029 (ref 1.005–1.030)
pH: 7 (ref 5.0–8.0)

## 2015-02-22 LAB — BASIC METABOLIC PANEL
Anion gap: 14 (ref 5–15)
BUN: 23 mg/dL — ABNORMAL HIGH (ref 6–20)
CO2: 26 mmol/L (ref 22–32)
CREATININE: 0.86 mg/dL (ref 0.44–1.00)
Calcium: 9.9 mg/dL (ref 8.9–10.3)
Chloride: 97 mmol/L — ABNORMAL LOW (ref 101–111)
GFR calc non Af Amer: >60 mL/min (ref >60)
Glucose, Bld: 426 mg/dL — ABNORMAL HIGH (ref 65–99)
Potassium: 4.7 mmol/L (ref 3.5–5.1)
SODIUM: 137 mmol/L (ref 135–145)

## 2015-02-22 LAB — CBG MONITORING, ED
GLUCOSE-CAPILLARY: 401 mg/dL — AB (ref 65–99)
Glucose-Capillary: 293 mg/dL — ABNORMAL HIGH (ref 65–99)

## 2015-02-22 LAB — CBC
HCT: 42.3 % (ref 36.0–46.0)
Hemoglobin: 14.3 g/dL (ref 12.0–15.0)
MCH: 30.6 pg (ref 26.0–34.0)
MCHC: 33.8 g/dL (ref 30.0–36.0)
MCV: 90.4 fL (ref 78.0–100.0)
PLATELETS: 292 10*3/uL (ref 150–400)
RBC: 4.68 MIL/uL (ref 3.87–5.11)
RDW: 12.6 % (ref 11.5–15.5)
WBC: 8.4 10*3/uL (ref 4.0–10.5)

## 2015-02-22 LAB — URINE MICROSCOPIC-ADD ON

## 2015-02-22 MED ORDER — HYDROMORPHONE HCL 1 MG/ML IJ SOLN
0.5000 mg | Freq: Once | INTRAMUSCULAR | Status: AC
  Administered 2015-02-22: 0.5 mg via INTRAVENOUS
  Filled 2015-02-22: qty 1

## 2015-02-22 MED ORDER — SODIUM CHLORIDE 0.9 % IV BOLUS (SEPSIS)
1000.0000 mL | Freq: Once | INTRAVENOUS | Status: AC
  Administered 2015-02-22: 1000 mL via INTRAVENOUS

## 2015-02-22 MED ORDER — TRAMADOL HCL 50 MG PO TABS: 50.0000 mg | ORAL_TABLET | Freq: Four times a day (QID) | ORAL | Status: DC | PRN

## 2015-02-22 NOTE — Discharge Instructions (Signed)
Diabetic Neuropathy Diabetic neuropathy is a nerve disease or nerve damage that is caused by diabetes mellitus. About half of all people with diabetes mellitus have some form of nerve damage. Nerve damage is more common in those who have had diabetes mellitus for many years and who generally have not had good control of their blood sugar (glucose) level. Diabetic neuropathy is a common complication of diabetes mellitus. There are three common types of diabetic neuropathy and a fourth type that is less common and less understood:   Peripheral neuropathy--This is the most common type of diabetic neuropathy. It causes damage to the nerves of the feet and legs first and then eventually the hands and arms.The damage affects the ability to sense touch.  Autonomic neuropathy--This type causes damage to the autonomic nervous system, which controls the following functions:  Heartbeat.  Body temperature.  Blood pressure.  Urination.  Digestion.  Sweating.  Sexual function.  Focal neuropathy--Focal neuropathy can be painful and unpredictable and occurs most often in older adults with diabetes mellitus. It involves a specific nerve or one area and often comes on suddenly. It usually does not cause long-term problems.  Radiculoplexus neuropathy-- Sometimes called lumbosacral radiculoplexus neuropathy, radiculoplexus neuropathy affects the nerves of the thighs, hips, buttocks, or legs. It is more common in people with type 2 diabetes mellitus and in older men. It is characterized by debilitating pain, weakness, and atrophy, usually in the thigh muscles. CAUSES  The cause of peripheral, autonomic, and focal neuropathies is diabetes mellitus that is uncontrolled and high glucose levels. The cause of radiculoplexus neuropathy is unknown. However, it is thought to be caused by inflammation related to uncontrolled glucose levels. SIGNS AND SYMPTOMS  Peripheral Neuropathy Peripheral neuropathy develops  slowly over time. When the nerves of the feet and legs no longer work there may be:   Burning, stabbing, or aching pain in the legs or feet.  Inability to feel pressure or pain in your feet. This can lead to:  Thick calluses over pressure areas.  Pressure sores.  Ulcers.  Foot deformities.  Reduced ability to feel temperature changes.  Muscle weakness. Autonomic Neuropathy The symptoms of autonomic neuropathy vary depending on which nerves are affected. Symptoms may include:  Problems with digestion, such as:  Feeling sick to your stomach (nausea).  Vomiting.  Bloating.  Constipation.  Diarrhea.  Abdominal pain.  Difficulty with urination. This occurs if you lose your ability to sense when your bladder is full. Problems include:  Urine leakage (incontinence).  Inability to empty your bladder completely (retention).  Rapid or irregular heartbeat (palpitations).  Blood pressure drops when you stand up (orthostatic hypotension). When you stand up you may feel:  Dizzy.  Weak.  Faint.  In men, inability to attain and maintain an erection.  In women, vaginal dryness and problems with decreased sexual desire and arousal.  Problems with body temperature regulation.  Increased or decreased sweating. Focal Neuropathy  Abnormal eye movements or abnormal alignment of both eyes.  Weakness in the wrist.  Foot drop. This results in an inability to lift the foot properly and abnormal walking or foot movement.  Paralysis on one side of your face (Bell palsy).  Chest or abdominal pain. Radiculoplexus Neuropathy  Sudden, severe pain in your hip, thigh, or buttocks.  Weakness and wasting of thigh muscles.  Difficulty rising from a seated position.  Abdominal swelling.  Unexplained weight loss (usually more than 10 lb [4.5 kg]). DIAGNOSIS  Peripheral Neuropathy Your senses may be  tested. Sensory function testing can be done with:  A Barney touch using a  monofilament.  A vibration with tuning fork.  A sharp sensation with a pin prick. Other tests that can help diagnose neuropathy are:  Nerve conduction velocity. This test checks the transmission of an electrical current through a nerve.  Electromyography. This shows how muscles respond to electrical signals transmitted by nearby nerves.  Quantitative sensory testing. This is used to assess how your nerves respond to vibrations and changes in temperature. Autonomic Neuropathy Diagnosis is often based on reported symptoms. Tell your health care provider if you experience:   Dizziness.   Constipation.   Diarrhea.   Inappropriate urination or inability to urinate.   Inability to get or maintain an erection.  Tests that may be done include:   Electrocardiography or Holter monitor. These are tests that can help show problems with the heart rate or heart rhythm.   An X-ray exam may be done. Focal Neuropathy Diagnosis is made based on your symptoms and what your health care provider finds during your exam. Other tests may be done. They may include:  Nerve conduction velocities. This checks the transmission of electrical current through a nerve.  Electromyography. This shows how muscles respond to electrical signals transmitted by nearby nerves.  Quantitative sensory testing. This test is used to assess how your nerves respond to vibration and changes in temperature. Radiculoplexus Neuropathy  Often the first thing is to eliminate any other issue or problems that might be the cause, as there is no stick test for diagnosis.  X-ray exam of your spine and lumbar region.  Spinal tap to rule out cancer.  MRI to rule out other lesions. TREATMENT  Once nerve damage occurs, it cannot be reversed. The goal of treatment is to keep the disease or nerve damage from getting worse and affecting more nerve fibers. Controlling your blood glucose level is the key. Most people with  radiculoplexus neuropathy see at least a partial improvement over time. You will need to keep your blood glucose and HbA1c levels in the target range determined by your health care provider. Things that help control blood glucose levels include:   Blood glucose monitoring.   Meal planning.   Physical activity.   Diabetes medicine.  Over time, maintaining lower blood glucose levels helps lessen symptoms. Sometimes, prescription pain medicine is needed. HOME CARE INSTRUCTIONS:  Do not smoke.  Keep your blood glucose level in the range that you and your health care provider have determined acceptable for you.  Keep your blood pressure level in the range that you and your health care provider have determined acceptable for you.  Eat a well-balanced diet.  Be physically active every day. Include strength training and balance exercises.  Protect your feet.  Check your feet every day for sores, cuts, blisters, or signs of infection.  Wear padded socks and supportive shoes. Use orthotic inserts, if necessary.  Regularly check the insides of your shoes for worn spots. Make sure there are no rocks or other items inside your shoes before you put them on. SEEK MEDICAL CARE IF:   You have burning, stabbing, or aching pain in the legs or feet.  You are unable to feel pressure or pain in your feet.  You develop problems with digestion such as:  Nausea.  Vomiting.  Bloating.  Constipation.  Diarrhea.  Abdominal pain.  You have difficulty with urination, such as:  Incontinence.  Retention.  You have palpitations.  You  develop orthostatic hypotension. When you stand up you may feel:  Dizzy.  Weak.  Faint.  You cannot attain and maintain an erection (in men).  You have vaginal dryness and problems with decreased sexual desire and arousal (in women).  You have severe pain in your thighs, legs, or buttocks.  You have unexplained weight loss.   This information  is not intended to replace advice given to you by your health care provider. Make sure you discuss any questions you have with your health care provider.   Document Released: 03/18/2001 Document Revised: 01/28/2014 Document Reviewed: 06/18/2012 Elsevier Interactive Patient Education 2016 [place]. Hyperglycemia Hyperglycemia occurs when the glucose (sugar) in your blood is too high. Hyperglycemia can happen for many reasons, but it most often happens to people who do not know they have diabetes or are not managing their diabetes properly.  CAUSES  Whether you have diabetes or not, there are other causes of hyperglycemia. Hyperglycemia can occur when you have diabetes, but it can also occur in other situations that you might not be as aware of, such as: Diabetes  If you have diabetes and are having problems controlling your blood glucose, hyperglycemia could occur because of some of the following reasons:  Not following your meal plan.  Not taking your diabetes medications or not taking it properly.  Exercising less or doing less activity than you normally do.  Being sick. Pre-diabetes  This cannot be ignored. Before people develop Type 2 diabetes, they almost always have "pre-diabetes." This is when your blood glucose levels are higher than normal, but not yet high enough to be diagnosed as diabetes. Research has shown that some long-term damage to the body, especially the heart and circulatory system, may already be occurring during pre-diabetes. If you take action to manage your blood glucose when you have pre-diabetes, you may delay or prevent Type 2 diabetes from developing. Stress  If you have diabetes, you may be "diet" controlled or on oral medications or insulin to control your diabetes. However, you may find that your blood glucose is higher than usual in the hospital whether you have diabetes or not. This is often referred to as "stress hyperglycemia." Stress can elevate your  blood glucose. This happens because of hormones put out by the body during times of stress. If stress has been the cause of your high blood glucose, it can be followed regularly by your caregiver. That way he/she can make sure your hyperglycemia does not continue to get worse or progress to diabetes. Steroids  Steroids are medications that act on the infection fighting system (immune system) to block inflammation or infection. One side effect can be a rise in blood glucose. Most people can produce enough extra insulin to allow for this rise, but for those who cannot, steroids make blood glucose levels go even higher. It is not unusual for steroid treatments to "uncover" diabetes that is developing. It is not always possible to determine if the hyperglycemia will go away after the steroids are stopped. A special blood test called an A1c is sometimes done to determine if your blood glucose was elevated before the steroids were started. SYMPTOMS  Thirsty.  Frequent urination.  Dry mouth.  Blurred vision.  Tired or fatigue.  Weakness.  Sleepy.  Tingling in feet or leg. DIAGNOSIS  Diagnosis is made by monitoring blood glucose in one or all of the following ways:  A1c test. This is a chemical found in your blood.  Fingerstick blood glucose  monitoring.  Laboratory results. TREATMENT  First, knowing the cause of the hyperglycemia is important before the hyperglycemia can be treated. Treatment may include, but is not be limited to:  Education.  Change or adjustment in medications.  Change or adjustment in meal plan.  Treatment for an illness, infection, etc.  More frequent blood glucose monitoring.  Change in exercise plan.  Decreasing or stopping steroids.  Lifestyle changes. HOME CARE INSTRUCTIONS   Test your blood glucose as directed.  Exercise regularly. Your caregiver will give you instructions about exercise. Pre-diabetes or diabetes which comes on with stress is  helped by exercising.  Eat wholesome, balanced meals. Eat often and at regular, fixed times. Your caregiver or nutritionist will give you a meal plan to guide your sugar intake.  Being at an ideal weight is important. If needed, losing as little as 10 to 15 pounds may help improve blood glucose levels. SEEK MEDICAL CARE IF:   You have questions about medicine, activity, or diet.  You continue to have symptoms (problems such as increased thirst, urination, or weight gain). SEEK IMMEDIATE MEDICAL CARE IF:   You are vomiting or have diarrhea.  Your breath smells fruity.  You are breathing faster or slower.  You are very sleepy or incoherent.  You have numbness, tingling, or pain in your feet or hands.  You have chest pain.  Your symptoms get worse even though you have been following your caregiver's orders.  If you have any other questions or concerns.   This information is not intended to replace advice given to you by your health care provider. Make sure you discuss any questions you have with your health care provider.   Document Released: 07/03/2000 Document Revised: 04/01/2011 Document Reviewed: 09/13/2014 Elsevier Interactive Patient Education [employer].

## 2015-02-22 NOTE — ED Notes (Addendum)
According to EMS, pt states she awoke with bilateral lower extremity pain she describes as "pins and needles". EMS recorded BSG of 460. Pt has hx of Type II Diabetes.

## 2015-02-22 NOTE — ED Provider Notes (Signed)
CSN: [id number]     Arrival date & time 02/22/15  0319 History   First MD Initiated Contact with Patient 02/22/15 0335     Chief Complaint  Patient presents with  . Hyperglycemia  . Leg Pain     (Consider location/radiation/quality/duration/timing/severity/associated sxs/prior Treatment) HPI Comments: The patient arrives via EMS with complaint of bilateral, severe leg and foot pain that she reports feels like her diabetic neuropathy. She take gabapentin for pain usually and is well controlled. No new injury. No fever. She reports some diarrhea lately, over the past 3 days with up to 3 stools daily, non-bloody. No nausea or vomiting, no cough, congestion. She states she takes her medications as prescribed. Per EMS report, the patient was found to be hyperglycemic.   Patient is a 65 y.o. female presenting with hyperglycemia and leg pain. The history is provided by the patient. No language interpreter was used.  Hyperglycemia Associated symptoms: no abdominal pain, no dysuria, no fever, no nausea, no shortness of breath and no vomiting   Leg Pain Associated symptoms: no fever     Past Medical History  Diagnosis Date  . Hypertension   . Breast cancer ([place])   . Diabetes mellitus without complication ([place])   . Migraine   . Hypothyroid   . Epilepsy ([place])   . Esophageal reflux    Past Surgical History  Procedure Laterality Date  . Mastectomy    . Knee surgery    . Appendectomy    . Abdominal hysterectomy     History reviewed. No pertinent family history. Social History  Substance Use Topics  . Smoking status: Never Smoker   . Smokeless tobacco: None  . Alcohol Use: No   OB History    No data available     Review of Systems  Constitutional: Negative for fever and chills.  HENT: Negative.   Respiratory: Negative.  Negative for shortness of breath.   Cardiovascular: Negative.   Gastrointestinal: Positive for diarrhea. Negative for nausea, vomiting and abdominal pain.   Genitourinary: Negative.  Negative for dysuria.  Musculoskeletal: Negative for myalgias.       See HPI.  Skin: Negative.  Negative for rash.  Neurological: Negative.       Allergies  Aspirin; Erythromycin; Maxzide; Metformin and related; Morphine and related; Nsaids; Other; Pravachol; Stadol; and Vistaril  Home Medications   Prior to Admission medications   Medication Sig Start Date End Date Taking? Authorizing Provider  ALPRAZolam Duanne Moron) 0.5 MG tablet Take 1 tablet (0.5 mg total) by mouth 4 (four) times daily. 12/06/13   Lisette Abu, PA-C  atenolol (TENORMIN) 50 MG tablet Take 50 mg by mouth daily.    Historical Provider, MD  citalopram (CELEXA) 20 MG tablet Take 20 mg by mouth daily.    Historical Provider, MD  clotrimazole-betamethasone (LOTRISONE) cream Apply 1 application topically 2 (two) times daily.    Historical Provider, MD  divalproex (DEPAKOTE) 500 MG DR tablet Take 500 mg by mouth 3 (three) times daily.    Historical Provider, MD  gabapentin (NEURONTIN) 300 MG capsule Take 300 mg by mouth 3 (three) times daily.    Historical Provider, MD  glucose blood test strip 1 each by Other route 3 (three) times daily. Use as instructed    Historical Provider, MD  insulin aspart (NOVOLOG) 100 UNIT/ML injection Inject 4-12 Units into the skin 3 (three) times daily before meals. Per sliding scale. For every 100 or >, uses 2 additional units.    Historical  Provider, MD  insulin glargine (LANTUS) 100 UNIT/ML injection Inject 30 Units into the skin daily.    Historical Provider, MD  levothyroxine (SYNTHROID, LEVOTHROID) 200 MCG tablet Take 400 mcg by mouth daily before breakfast.     Historical Provider, MD  lisinopril (PRINIVIL,ZESTRIL) 10 MG tablet Take 10 mg by mouth daily.    Historical Provider, MD  sulfamethoxazole-trimethoprim (BACTRIM DS,SEPTRA DS) 800-160 MG per tablet Take 1 tablet by mouth every 12 (twelve) hours. 12/03/13   Lisette Abu, PA-C  traMADol (ULTRAM) 50 MG  tablet Take 1-2 tablets (50-100 mg total) by mouth every 6 (six) hours as needed (Pain). 12/06/13   Lisette Abu, PA-C  zolpidem (AMBIEN) 10 MG tablet Take 10 mg by mouth at bedtime as needed for sleep.    Historical Provider, MD   BP 172/82 mmHg  Pulse 86  Temp(Src) 97.5 F (36.4 C) (Oral)  Resp 16  SpO2 95% Physical Exam  Constitutional: She is oriented to person, place, and time. She appears well-developed and well-nourished.  HENT:  Head: Normocephalic.  Neck: Normal range of motion. Neck supple.  Cardiovascular: Normal rate and regular rhythm.   Pulmonary/Chest: Effort normal and breath sounds normal.  Abdominal: Soft. Bowel sounds are normal. There is no tenderness. There is no rebound and no guarding.  Musculoskeletal: Normal range of motion. She exhibits no edema.  FROM bilateral lower extremities without redness, warmth, sore/lesion or swelling. Diffusely tender.   Neurological: She is alert and oriented to person, place, and time. Coordination normal.  Skin: Skin is warm and dry. No rash noted.  Psychiatric: She has a normal mood and affect.    ED Course  Procedures (including critical care time) Labs Review Labs Reviewed  CBG MONITORING, ED - Abnormal; Notable for the following:    Glucose-Capillary 401 (*)    All other components within normal limits  CBC  BASIC METABOLIC PANEL  URINALYSIS, ROUTINE W REFLEX MICROSCOPIC (NOT AT [facility])   Results for orders placed or performed during the hospital encounter of [id number]  Basic metabolic panel  Result Value Ref Range   Sodium 137 135 - 145 mmol/L   Potassium 4.7 3.5 - 5.1 mmol/L   Chloride 97 (L) 101 - 111 mmol/L   CO2 26 22 - 32 mmol/L   Glucose, Bld 426 (H) 65 - 99 mg/dL   BUN 23 (H) 6 - 20 mg/dL   Creatinine, Ser 0.86 0.44 - 1.00 mg/dL   Calcium 9.9 8.9 - 10.3 mg/dL   GFR calc non Af Amer >60 >60 mL/min   GFR calc Af Amer >60 >60 mL/min   Anion gap 14 5 - 15  CBC  Result Value Ref Range   WBC 8.4 4.0 -  10.5 K/uL   RBC 4.68 3.87 - 5.11 MIL/uL   Hemoglobin 14.3 12.0 - 15.0 g/dL   HCT 42.3 36.0 - 46.0 %   MCV 90.4 78.0 - 100.0 fL   MCH 30.6 26.0 - 34.0 pg   MCHC 33.8 30.0 - 36.0 g/dL   RDW 12.6 11.5 - 15.5 %   Platelets 292 150 - 400 K/uL  Urinalysis, Routine w reflex microscopic (not at [facility])  Result Value Ref Range   Color, Urine YELLOW YELLOW   APPearance CLEAR CLEAR   Specific Gravity, Urine 1.029 1.005 - 1.030   pH 7.0 5.0 - 8.0   Glucose, UA >1000 (A) NEGATIVE mg/dL   Hgb urine dipstick NEGATIVE NEGATIVE   Bilirubin Urine NEGATIVE NEGATIVE   Ketones,  ur NEGATIVE NEGATIVE mg/dL   Protein, ur NEGATIVE NEGATIVE mg/dL   Nitrite NEGATIVE NEGATIVE   Leukocytes, UA NEGATIVE NEGATIVE  Urine microscopic-add on  Result Value Ref Range   Squamous Epithelial / LPF 0-5 (A) NONE SEEN   WBC, UA 0-5 0 - 5 WBC/hpf   RBC / HPF 0-5 0 - 5 RBC/hpf   Bacteria, UA RARE (A) NONE SEEN  CBG monitoring, ED  Result Value Ref Range   Glucose-Capillary 401 (H) 65 - 99 mg/dL  CBG monitoring, ED  Result Value Ref Range   Glucose-Capillary 293 (H) 65 - 99 mg/dL    Imaging Review No results found. I have personally reviewed and evaluated these images and lab results as part of my medical decision-making.   EKG Interpretation None      MDM   Final diagnoses:  None    1. Hyperglycemia 2. Diabetic neuropathy  Blood sugar decreasing with IVF. No evidence acidosis. Bilateral leg pain without swelling, redness or concern for infection. She relates pain is the same as what has been diagnosed as neuropathy and for which she takes gabapentin. She is encouraged to continue all her current medications as prescribed. She is felt stable for discharge home.     Charlann Lange, PA-C 02/23/15 0004  Jola Schmidt, MD 02/23/15 [phone]

## 2015-03-02 ENCOUNTER — Emergency Department (HOSPITAL_COMMUNITY)
Admission: EM | Admit: 2015-03-02 | Discharge: 2015-03-02 | Disposition: A | Discharge status: Home / Self Care | Payer: Medicare Other | Attending: Emergency Medicine | Admitting: Emergency Medicine

## 2015-03-02 ENCOUNTER — Encounter (HOSPITAL_COMMUNITY): Payer: Self-pay | Admitting: Emergency Medicine

## 2015-03-02 DIAGNOSIS — I10 Essential (primary) hypertension: Secondary | ICD-10-CM | POA: Insufficient documentation

## 2015-03-02 DIAGNOSIS — Z8719 Personal history of other diseases of the digestive system: Secondary | ICD-10-CM | POA: Diagnosis not present

## 2015-03-02 DIAGNOSIS — G43909 Migraine, unspecified, not intractable, without status migrainosus: Secondary | ICD-10-CM | POA: Insufficient documentation

## 2015-03-02 DIAGNOSIS — M79671 Pain in right foot: Secondary | ICD-10-CM | POA: Diagnosis present

## 2015-03-02 DIAGNOSIS — Z794 Long term (current) use of insulin: Secondary | ICD-10-CM | POA: Insufficient documentation

## 2015-03-02 DIAGNOSIS — Z853 Personal history of malignant neoplasm of breast: Secondary | ICD-10-CM | POA: Diagnosis not present

## 2015-03-02 DIAGNOSIS — Z79899 Other long term (current) drug therapy: Secondary | ICD-10-CM | POA: Insufficient documentation

## 2015-03-02 DIAGNOSIS — E039 Hypothyroidism, unspecified: Secondary | ICD-10-CM | POA: Insufficient documentation

## 2015-03-02 DIAGNOSIS — G8929 Other chronic pain: Secondary | ICD-10-CM | POA: Diagnosis not present

## 2015-03-02 DIAGNOSIS — E119 Type 2 diabetes mellitus without complications: Secondary | ICD-10-CM | POA: Insufficient documentation

## 2015-03-02 DIAGNOSIS — G629 Polyneuropathy, unspecified: Secondary | ICD-10-CM | POA: Insufficient documentation

## 2015-03-02 DIAGNOSIS — G40909 Epilepsy, unspecified, not intractable, without status epilepticus: Secondary | ICD-10-CM | POA: Diagnosis not present

## 2015-03-02 MED ORDER — TRAMADOL HCL 50 MG PO TABS
50.0000 mg | ORAL_TABLET | Freq: Once | ORAL | Status: AC
  Administered 2015-03-02: 50 mg via ORAL
  Filled 2015-03-02: qty 1

## 2015-03-02 NOTE — ED Notes (Signed)
Patient presents via EMS for diabetic neuropathy x1 day. Appt for pain management on 2/24. Ran out of tramadol 2 days ago. Ambulatory to room with steady gait.   Last 200/100, 100hr, 18resp, 241cbg

## 2015-03-02 NOTE — Discharge Instructions (Signed)

## 2015-03-02 NOTE — ED Notes (Signed)
Pt states Tramadol alone does nothing for her pain, she needs Hydrocodone along with Tramadol to be effective. Pt unable to reach her son at this time for transportation home.

## 2015-03-02 NOTE — ED Provider Notes (Addendum)
CSN: [id number]     Arrival date & time 03/02/15  0249 History   By signing my name below, I, Andrea [name], attest that this documentation has been prepared under the direction and in the presence of Andrea Djordjevic, MD.  Electronically Signed: Forrestine [name], ED Scribe. 03/02/2015. 3:26 AM.   Chief Complaint  Patient presents with  . Foot Pain   Patient is a 65 y.o. female presenting with lower extremity pain. The history is provided by the patient. No language interpreter was used.  Foot Pain This is a chronic problem. The current episode started yesterday. The problem occurs constantly. The problem has been gradually worsening. Pertinent negatives include no chest pain, no abdominal pain, no headaches and no shortness of breath. Nothing aggravates the symptoms. Nothing relieves the symptoms. She has tried nothing for the symptoms. The treatment provided no relief.    HPI Comments: Andrea [name] brought in by EMS is a 65 y.o. female with a PMHx of HTN and DM who presents to the Emergency Department complaining of constant, ongoing, worsening bilateral foot pain x 1 day. Pt states pain is related to her diabetic neuropathy. No aggravating or alleviating factors at this time. Pt typically takes Gabapentin and Toradol at home but states she recently ran out of her Toradol medication. No recent fever, chills, nausea, vomiting, chest pain, or shortness of breath. Ms. Andrea [name] states she is scheduled to see a pain specialist and a podiatrist in the near future.   PCP: Andrea Chafe, MD    Past Medical History  Diagnosis Date  . Hypertension   . Breast cancer ([place])   . Diabetes mellitus without complication ([place])   . Migraine   . Hypothyroid   . Epilepsy ([place])   . Esophageal reflux    Past Surgical History  Procedure Laterality Date  . Mastectomy    . Knee surgery    . Appendectomy    . Abdominal hysterectomy     No family history on file. Social History  Substance Use  Topics  . Smoking status: Never Smoker   . Smokeless tobacco: None  . Alcohol Use: No   OB History    No data available     Review of Systems  Constitutional: Negative for fever and chills.  Respiratory: Negative for shortness of breath.   Cardiovascular: Negative for chest pain.  Gastrointestinal: Negative for nausea, vomiting and abdominal pain.  Musculoskeletal: Positive for arthralgias.  Neurological: Negative for headaches.  Psychiatric/Behavioral: Negative for confusion.  All other systems reviewed and are negative.     Allergies  Aspirin; Erythromycin; Maxzide; Metformin and related; Morphine and related; Nsaids; Other; Pravachol; Stadol; and Vistaril  Home Medications   Prior to Admission medications   Medication Sig Start Date End Date Taking? Authorizing Provider  ALPRAZolam Andrea [name]) 0.5 MG tablet Take 1 tablet (0.5 mg total) by mouth 4 (four) times daily. 12/06/13   Andrea Abu, PA-C  atenolol (TENORMIN) 50 MG tablet Take 50 mg by mouth daily.    Historical Provider, MD  divalproex (DEPAKOTE) 500 MG DR tablet Take 500 mg by mouth 3 (three) times daily.    Historical Provider, MD  gabapentin (NEURONTIN) 300 MG capsule Take 300 mg by mouth 3 (three) times daily.    Historical Provider, MD  glucose blood test strip 1 each by Other route 3 (three) times daily. Use as instructed    Historical Provider, MD  HYDROcodone-acetaminophen (NORCO) 7.5-325 MG tablet Take 1 tablet by mouth every 6 (six) hours  as needed. 01/31/15   Historical Provider, MD  insulin aspart (NOVOLOG) 100 UNIT/ML injection Inject 4-12 Units into the skin 3 (three) times daily before meals. Per sliding scale. For every 100 or >, uses 2 additional units.    Historical Provider, MD  insulin glargine (LANTUS) 100 UNIT/ML injection Inject 50 Units into the skin daily.     Historical Provider, MD  levothyroxine (SYNTHROID, LEVOTHROID) 200 MCG tablet Take 400 mcg by mouth daily before breakfast.      Historical Provider, MD  lisinopril (PRINIVIL,ZESTRIL) 10 MG tablet Take 10 mg by mouth daily.    Historical Provider, MD  traMADol (ULTRAM) 50 MG tablet Take 1 tablet (50 mg total) by mouth every 6 (six) hours as needed. 02/22/15   Andrea Lange, PA-C  zolpidem (AMBIEN) 10 MG tablet Take 10 mg by mouth at bedtime as needed for sleep.    Historical Provider, MD   Triage Vitals: BP 186/89 mmHg  Pulse 93  Temp(Src) 98.3 F (36.8 C) (Oral)  Resp 18  SpO2 99%   Physical Exam  Constitutional: She is oriented to person, place, and time. She appears well-developed and well-nourished. No distress.  HENT:  Head: Normocephalic and atraumatic.  Mouth/Throat: Oropharynx is clear and moist. No oropharyngeal exudate.  Eyes: EOM are normal. Pupils are equal, round, and reactive to light.  Neck: Normal range of motion.  Cardiovascular: Normal rate, regular rhythm and normal heart sounds.   Pulses:      Dorsalis pedis pulses are 3+ on the right side, and 3+ on the left side.  Capillary refill to toes less than 2 seconds  Pulmonary/Chest: Effort normal and breath sounds normal. She has no wheezes. She has no rales.  Abdominal: Soft. She exhibits no distension. There is no tenderness. There is no rebound and no guarding.  Musculoskeletal: Normal range of motion.  No signs of athletes foot No ulcerations noted to feet No laceration noted No warmth to feet  Neurological: She is alert and oriented to person, place, and time.  Skin: Skin is warm and dry. No rash noted. No erythema. No pallor.  Psychiatric: She has a normal mood and affect. Judgment normal.  Nursing note and vitals reviewed.   ED Course  Procedures (including critical care time)  DIAGNOSTIC STUDIES: Oxygen Saturation is 99% on RA, Normal by my interpretation.    COORDINATION OF CARE: 3:21 AM- Will give Ultram. Discussed treatment plan with pt at bedside and pt agreed to plan.     Labs Review Labs Reviewed - No data to  display  Imaging Review No results found.   EKG Interpretation None      MDM   Final diagnoses:  None    The ED does not refill narcotic pain medication.  Moreover, per PMD (Dr. Doreene Nest) note in care everywhere patient was written for norco 7.5/325 mg # 60 on 02/28/2015.  Per report patient is scheduled to see pain management. She will need to follow up for ongoing chronic pain  I personally performed the services described in this documentation, which was scribed in my presence. The recorded information has been reviewed and is accurate.     Veatrice Kells, MD 03/02/15 0335  Veatrice Kells, MD 03/02/15 [phone]

## 2015-03-02 NOTE — ED Notes (Signed)
Pt states she has no pain meds to take until the 28th when she can get to [facility]. Pt reminded she received script for Hydrocodone 7.5 x 60 on 02/28/15 to be filled on 03/03/15. Pt stating she will call cab for transport. Pt taken to [facility] via [facility].

## 2015-03-02 NOTE — ED Notes (Signed)
Pt ambulatory to [facility], slow but steady gait

## 2015-03-02 NOTE — ED Notes (Signed)
Bed: WA17 Expected date:  Expected time:  Means of arrival:  Comments: EMS 65yo F bilateral foot pain / neuropathy / out of pain meds

## 2015-06-27 ENCOUNTER — Other Ambulatory Visit: Payer: Self-pay | Admitting: Pain Medicine

## 2015-06-27 DIAGNOSIS — M542 Cervicalgia: Secondary | ICD-10-CM

## 2015-08-28 HISTORY — PX: EYE SURGERY: SHX253

## 2015-09-25 ENCOUNTER — Encounter (HOSPITAL_COMMUNITY): Payer: Self-pay | Admitting: Emergency Medicine

## 2015-09-25 ENCOUNTER — Emergency Department (HOSPITAL_COMMUNITY)
Admission: EM | Admit: 2015-09-25 | Discharge: 2015-09-25 | Disposition: A | Discharge status: Home / Self Care | Payer: Medicare Other | Attending: Emergency Medicine | Admitting: Emergency Medicine

## 2015-09-25 ENCOUNTER — Emergency Department (HOSPITAL_COMMUNITY): Payer: Medicare Other

## 2015-09-25 DIAGNOSIS — M7989 Other specified soft tissue disorders: Secondary | ICD-10-CM

## 2015-09-25 DIAGNOSIS — M79674 Pain in right toe(s): Secondary | ICD-10-CM

## 2015-09-25 DIAGNOSIS — Z853 Personal history of malignant neoplasm of breast: Secondary | ICD-10-CM | POA: Insufficient documentation

## 2015-09-25 DIAGNOSIS — I10 Essential (primary) hypertension: Secondary | ICD-10-CM | POA: Diagnosis not present

## 2015-09-25 DIAGNOSIS — Z79899 Other long term (current) drug therapy: Secondary | ICD-10-CM | POA: Insufficient documentation

## 2015-09-25 DIAGNOSIS — Z794 Long term (current) use of insulin: Secondary | ICD-10-CM | POA: Diagnosis not present

## 2015-09-25 DIAGNOSIS — E119 Type 2 diabetes mellitus without complications: Secondary | ICD-10-CM | POA: Diagnosis not present

## 2015-09-25 DIAGNOSIS — L03031 Cellulitis of right toe: Secondary | ICD-10-CM | POA: Insufficient documentation

## 2015-09-25 LAB — BASIC METABOLIC PANEL
Anion gap: 7 (ref 5–15)
BUN: 20 mg/dL (ref 6–20)
CALCIUM: 9.4 mg/dL (ref 8.9–10.3)
CO2: 24 mmol/L (ref 22–32)
CREATININE: 0.78 mg/dL (ref 0.44–1.00)
Chloride: 108 mmol/L (ref 101–111)
GLUCOSE: 246 mg/dL — AB (ref 65–99)
Potassium: 3.8 mmol/L (ref 3.5–5.1)
Sodium: 139 mmol/L (ref 135–145)

## 2015-09-25 LAB — CBC WITH DIFFERENTIAL/PLATELET
BASOS ABS: 0 10*3/uL (ref 0.0–0.1)
BASOS PCT: 0 %
EOS ABS: 0.1 10*3/uL (ref 0.0–0.7)
Eosinophils Relative: 2 %
HCT: 34.7 % — ABNORMAL LOW (ref 36.0–46.0)
Hemoglobin: 12 g/dL (ref 12.0–15.0)
Lymphocytes Relative: 39 %
Lymphs Abs: 2.2 10*3/uL (ref 0.7–4.0)
MCH: 30.3 pg (ref 26.0–34.0)
MCHC: 34.6 g/dL (ref 30.0–36.0)
MCV: 87.6 fL (ref 78.0–100.0)
MONO ABS: 0.4 10*3/uL (ref 0.1–1.0)
MONOS PCT: 7 %
NEUTROS PCT: 52 %
Neutro Abs: 2.9 10*3/uL (ref 1.7–7.7)
PLATELETS: 300 10*3/uL (ref 150–400)
RBC: 3.96 MIL/uL (ref 3.87–5.11)
RDW: 12.6 % (ref 11.5–15.5)
WBC: 5.6 10*3/uL (ref 4.0–10.5)

## 2015-09-25 LAB — SEDIMENTATION RATE: SED RATE: 46 mm/h — AB (ref 0–22)

## 2015-09-25 MED ORDER — SODIUM CHLORIDE 0.9 % IV BOLUS (SEPSIS)
1000.0000 mL | Freq: Once | INTRAVENOUS | Status: AC
Start: 2015-09-25 — End: 2015-09-25
  Administered 2015-09-25: 1000 mL via INTRAVENOUS

## 2015-09-25 MED ORDER — CLINDAMYCIN HCL 150 MG PO CAPS: 300.0000 mg | ORAL_CAPSULE | Freq: Three times a day (TID) | ORAL | 0 refills | Status: DC

## 2015-09-25 MED ORDER — OXYCODONE-ACETAMINOPHEN 10-325 MG PO TABS: 1.0000 | ORAL_TABLET | Freq: Four times a day (QID) | ORAL | 0 refills | Status: DC | PRN

## 2015-09-25 MED ORDER — HYDROMORPHONE HCL 1 MG/ML IJ SOLN
0.5000 mg | Freq: Once | INTRAMUSCULAR | Status: AC
  Administered 2015-09-25: 0.5 mg via INTRAVENOUS
  Filled 2015-09-25: qty 1

## 2015-09-25 MED ORDER — CLINDAMYCIN HCL 300 MG PO CAPS
300.0000 mg | ORAL_CAPSULE | Freq: Four times a day (QID) | ORAL | Status: DC
  Administered 2015-09-25: 300 mg via ORAL
  Filled 2015-09-25: qty 1

## 2015-09-25 MED ORDER — ONDANSETRON HCL 4 MG/2ML IJ SOLN
4.0000 mg | Freq: Once | INTRAMUSCULAR | Status: AC
  Administered 2015-09-25: 4 mg via INTRAVENOUS
  Filled 2015-09-25: qty 2

## 2015-09-25 NOTE — ED Provider Notes (Signed)
[facility] DEPT Provider Note   CSN: [id number] Arrival date & time: 09/25/15  1002     History   Chief Complaint Chief Complaint  Patient presents with  . Foot Pain    HPI Andrea [name] is a 65 y.o. female who presents to foot infection. She has a pmh of DM and Peripheral neuropathy.  She was out of town this weekend at her sister's house . She began having pain in the ER big toe 2 days ago. She's had progressively worsening redness and pain along with swelling of the foot and calf pain. She has noticed serous drainage from underneath the toenail. She has no known injury to the foot. She was seen at the clinic in [facility] yesterday and told to come to the ER but was out of town. This morning she came here. She denies fevers, chills, body aches. She denies a history of DVT, chest pain, shortness of breath. She states her blood sugars have been elevated ever since her toe has been hurting.  HPI  Past Medical History:  Diagnosis Date  . Breast cancer ([place])   . Diabetes mellitus without complication ([place])   . Epilepsy ([place])   . Esophageal reflux   . Hypertension   . Hypothyroid   . Migraine     Patient Active Problem List   Diagnosis Date Noted  . UTI (urinary tract infection) 12/03/2013  . Fall 12/02/2013  . Hypertension   . Breast cancer ([place])   . Diabetes mellitus without complication ([place])   . Migraine   . Hypothyroid   . Epilepsy ([place])   . Esophageal reflux   . Multiple rib fractures 11/28/2013    Past Surgical History:  Procedure Laterality Date  . ABDOMINAL HYSTERECTOMY    . APPENDECTOMY    . EYE SURGERY  08/28/2015   Right eye  . KNEE SURGERY    . MASTECTOMY      OB History    No data available       Home Medications    Prior to Admission medications   Medication Sig Start Date End Date Taking? Authorizing Provider  ALPRAZolam Duanne Moron) 0.5 MG tablet Take 1 tablet (0.5 mg total) by mouth 4 (four) times daily. 12/06/13   Lisette Abu,  PA-C  atenolol (TENORMIN) 50 MG tablet Take 50 mg by mouth daily.    Historical Provider, MD  divalproex (DEPAKOTE) 500 MG DR tablet Take 500 mg by mouth 3 (three) times daily.    Historical Provider, MD  gabapentin (NEURONTIN) 300 MG capsule Take 300 mg by mouth 3 (three) times daily.    Historical Provider, MD  glucose blood test strip 1 each by Other route 3 (three) times daily. Use as instructed    Historical Provider, MD  HYDROcodone-acetaminophen (NORCO) 7.5-325 MG tablet Take 1 tablet by mouth every 6 (six) hours as needed. 01/31/15   Historical Provider, MD  insulin aspart (NOVOLOG) 100 UNIT/ML injection Inject 4-12 Units into the skin 3 (three) times daily before meals. Per sliding scale. For every 100 or >, uses 2 additional units.    Historical Provider, MD  insulin glargine (LANTUS) 100 UNIT/ML injection Inject 50 Units into the skin daily.     Historical Provider, MD  levothyroxine (SYNTHROID, LEVOTHROID) 200 MCG tablet Take 400 mcg by mouth daily before breakfast.     Historical Provider, MD  lisinopril (PRINIVIL,ZESTRIL) 10 MG tablet Take 10 mg by mouth daily.    Historical Provider, MD  traMADol (ULTRAM) 50 MG  tablet Take 1 tablet (50 mg total) by mouth every 6 (six) hours as needed. 02/22/15   Charlann Lange, PA-C  zolpidem (AMBIEN) 10 MG tablet Take 10 mg by mouth at bedtime as needed for sleep.    Historical Provider, MD    Family History No family history on file.  Social History Social History  Substance Use Topics  . Smoking status: Never Smoker  . Smokeless tobacco: Never Used  . Alcohol use No     Allergies   Aspirin; Maxzide [hydrochlorothiazide w-triamterene]; Metformin and related; Nsaids; Other; Pravachol [pravastatin]; Stadol [butorphanol]; Toradol [ketorolac tromethamine]; Vistaril [hydroxyzine hcl]; Erythromycin; Morphine and related; and Penicillins   Review of Systems Review of Systems  Ten systems reviewed and are negative for acute change, except as  noted in the HPI.   Physical Exam Updated Vital Signs BP 175/91 (BP Location: Right Arm)   Pulse 92   Temp 97.8 F (36.6 C) (Oral)   Ht 5\' 4"  (1.626 m)   Wt 71.7 kg   SpO2 100%   BMI 27.12 kg/m   Physical Exam  Constitutional: She is oriented to person, place, and time. She appears well-developed and well-nourished. No distress.  HENT:  Head: Normocephalic and atraumatic.  Eyes: Conjunctivae are normal. No scleral icterus.  Neck: Normal range of motion.  Cardiovascular: Normal rate, regular rhythm, normal heart sounds and intact distal pulses.  Exam reveals no gallop and no friction rub.   No murmur heard. Pulmonary/Chest: Effort normal and breath sounds normal. No respiratory distress.  Abdominal: Soft. Bowel sounds are normal. She exhibits no distension and no mass. There is no tenderness. There is no guarding.  Musculoskeletal:  R great toe is violaceous, there is serous drainage from the nail. Erythema extends about midway down the foot. It is exquisitely tender to palpation. Patient is tender underneath the plantar surface of the MTPs. The right foot is swollen, with strong distal pulses. She has increased numbness and paresthesia from her normal. She also has tenderness and swelling in the calf.  Neurological: She is alert and oriented to person, place, and time.  Skin: Skin is warm and dry. She is not diaphoretic.     ED Treatments / Results  Labs (all labs ordered are listed, but only abnormal results are displayed) Labs Reviewed - No data to display  EKG  EKG Interpretation None       Radiology No results found.  Procedures Procedures (including critical care time)  Medications Ordered in ED Medications - No data to display   Initial Impression / Assessment and Plan / ED Course  I have reviewed the triage vital signs and the nursing notes.  Pertinent labs & imaging results that were available during my care of the patient were reviewed by me and  considered in my medical decision making (see chart for details).  Clinical Course   The patient with cellulitis of the right great toe. Seen in shared visit with Dr. Tyrone Nine, who feels this is likely mild cellulitis at this point and she can be treated with by mouth antibiotics. Patient afebrile and hemodynamically stable with hyperglycemia. She is to follow-up in 2 days for wound recheck. Discussed return precautions. Patient appears safe for discharge at this time.  Final Clinical Impressions(s) / ED Diagnoses   Final diagnoses:  None    New Prescriptions New Prescriptions   No medications on file     Margarita Mail, PA-C 09/25/15 [place], DO 09/26/15 [phone]

## 2015-09-25 NOTE — ED Triage Notes (Signed)
Pt presents to ED with complaints of elevated blood pressure and significant swelling/redness of the right great toe. Pt states this began on Friday. She says she went [facility] yesterday and was told she has a bacterial infection of the right great toe. The area of swelling has been marked by a family member and there is not sign of spreading at this time. Pt is diabetic, injured this toe last Thanksgiving when she dropped a can on the toe. She has neuropathy, and is receiving treatment for this. She states that the toe is now presenting pus from under the nail.

## 2015-09-30 LAB — CULTURE, BLOOD (ROUTINE X 2)
Culture: NO GROWTH
Culture: NO GROWTH

## 2015-10-26 ENCOUNTER — Emergency Department (HOSPITAL_COMMUNITY): Payer: Medicare Other

## 2015-10-26 ENCOUNTER — Encounter (HOSPITAL_COMMUNITY): Payer: Self-pay | Admitting: *Deleted

## 2015-10-26 ENCOUNTER — Emergency Department (HOSPITAL_COMMUNITY)
Admission: EM | Admit: 2015-10-26 | Discharge: 2015-10-26 | Disposition: A | Discharge status: Home / Self Care | Payer: Medicare Other | Attending: Emergency Medicine | Admitting: Emergency Medicine

## 2015-10-26 DIAGNOSIS — N3000 Acute cystitis without hematuria: Secondary | ICD-10-CM | POA: Diagnosis not present

## 2015-10-26 DIAGNOSIS — R1031 Right lower quadrant pain: Secondary | ICD-10-CM | POA: Diagnosis present

## 2015-10-26 DIAGNOSIS — I10 Essential (primary) hypertension: Secondary | ICD-10-CM | POA: Diagnosis not present

## 2015-10-26 DIAGNOSIS — R109 Unspecified abdominal pain: Secondary | ICD-10-CM

## 2015-10-26 DIAGNOSIS — E039 Hypothyroidism, unspecified: Secondary | ICD-10-CM | POA: Diagnosis not present

## 2015-10-26 DIAGNOSIS — Z853 Personal history of malignant neoplasm of breast: Secondary | ICD-10-CM | POA: Diagnosis not present

## 2015-10-26 DIAGNOSIS — E119 Type 2 diabetes mellitus without complications: Secondary | ICD-10-CM | POA: Diagnosis not present

## 2015-10-26 LAB — CBC WITH DIFFERENTIAL/PLATELET
BASOS ABS: 0 10*3/uL (ref 0.0–0.1)
BASOS PCT: 0 %
Eosinophils Absolute: 0.2 10*3/uL (ref 0.0–0.7)
Eosinophils Relative: 3 %
HEMATOCRIT: 37.3 % (ref 36.0–46.0)
HEMOGLOBIN: 12.7 g/dL (ref 12.0–15.0)
LYMPHS PCT: 47 %
Lymphs Abs: 3.4 10*3/uL (ref 0.7–4.0)
MCH: 30.3 pg (ref 26.0–34.0)
MCHC: 34 g/dL (ref 30.0–36.0)
MCV: 89 fL (ref 78.0–100.0)
MONO ABS: 0.5 10*3/uL (ref 0.1–1.0)
Monocytes Relative: 7 %
NEUTROS ABS: 3.2 10*3/uL (ref 1.7–7.7)
NEUTROS PCT: 43 %
Platelets: 288 10*3/uL (ref 150–400)
RBC: 4.19 MIL/uL (ref 3.87–5.11)
RDW: 12.8 % (ref 11.5–15.5)
WBC: 7.4 10*3/uL (ref 4.0–10.5)

## 2015-10-26 LAB — LIPASE, BLOOD: LIPASE: 25 U/L (ref 11–51)

## 2015-10-26 LAB — COMPREHENSIVE METABOLIC PANEL
ALT: 77 U/L — ABNORMAL HIGH (ref 14–54)
ANION GAP: 5 (ref 5–15)
AST: 59 U/L — AB (ref 15–41)
Albumin: 4.1 g/dL (ref 3.5–5.0)
Alkaline Phosphatase: 100 U/L (ref 38–126)
BILIRUBIN TOTAL: 0.2 mg/dL — AB (ref 0.3–1.2)
BUN: 26 mg/dL — ABNORMAL HIGH (ref 6–20)
CHLORIDE: 106 mmol/L (ref 101–111)
CO2: 26 mmol/L (ref 22–32)
Calcium: 9.6 mg/dL (ref 8.9–10.3)
Creatinine, Ser: 0.95 mg/dL (ref 0.44–1.00)
Glucose, Bld: 333 mg/dL — ABNORMAL HIGH (ref 65–99)
POTASSIUM: 4.2 mmol/L (ref 3.5–5.1)
Sodium: 137 mmol/L (ref 135–145)
TOTAL PROTEIN: 8.1 g/dL (ref 6.5–8.1)

## 2015-10-26 LAB — URINALYSIS, ROUTINE W REFLEX MICROSCOPIC
BILIRUBIN URINE: NEGATIVE
Glucose, UA: >1000 mg/dL — AB
KETONES UR: NEGATIVE mg/dL
NITRITE: POSITIVE — AB
PH: 7 (ref 5.0–8.0)
Protein, ur: 100 mg/dL — AB
Specific Gravity, Urine: 1.025 (ref 1.005–1.030)

## 2015-10-26 LAB — URINE MICROSCOPIC-ADD ON: Squamous Epithelial / LPF: NONE SEEN

## 2015-10-26 MED ORDER — HYDROCODONE-ACETAMINOPHEN 5-325 MG PO TABS
1.0000 | ORAL_TABLET | Freq: Once | ORAL | Status: AC
  Administered 2015-10-26: 1 via ORAL
  Filled 2015-10-26: qty 1

## 2015-10-26 MED ORDER — SULFAMETHOXAZOLE-TRIMETHOPRIM 800-160 MG PO TABS
1.0000 | ORAL_TABLET | Freq: Once | ORAL | Status: AC
  Administered 2015-10-26: 1 via ORAL
  Filled 2015-10-26: qty 1

## 2015-10-26 MED ORDER — SULFAMETHOXAZOLE-TRIMETHOPRIM 800-160 MG PO TABS
1.0000 | ORAL_TABLET | Freq: Two times a day (BID) | ORAL | 0 refills | Status: AC
Start: 2015-10-26 — End: 2015-11-02

## 2015-10-26 MED ORDER — FENTANYL CITRATE (PF) 100 MCG/2ML IJ SOLN
50.0000 ug | Freq: Once | INTRAMUSCULAR | Status: AC
Start: 2015-10-26 — End: 2015-10-26
  Administered 2015-10-26: 50 ug via INTRAVENOUS
  Filled 2015-10-26: qty 2

## 2015-10-26 MED ORDER — SODIUM CHLORIDE 0.9 % IV BOLUS (SEPSIS)
500.0000 mL | Freq: Once | INTRAVENOUS | Status: AC
  Administered 2015-10-26: 500 mL via INTRAVENOUS

## 2015-10-26 NOTE — ED Provider Notes (Signed)
Emergency Department Provider Note   I have reviewed the triage vital signs and the nursing notes.   HISTORY  Chief Complaint Flank Pain   HPI Andrea [name] is a 65 y.o. female with PMH of DM, epilepsy, HTN, and hypothyroid presents to the ED for evaluation of urinary hesitancy, urgency, frequency, and dysuria for the past 2 days. She reports progressively worsening right lower quadrant and flank pain that is intermittent and severe. She has no prior history of kidney stone. No history of recurrent urinary tract infections. She denies any fever in the last 2 days. She began treating for a presumed yeast infection today with vaginal suppositories but symptoms have continued. she is having bowel movements. She did have one episode of vomiting this morning. No chest pain or difficulty breathing. No headaches.   Past Medical History:  Diagnosis Date  . Breast cancer ([place])   . Diabetes mellitus without complication ([place])   . Epilepsy ([place])   . Esophageal reflux   . Hypertension   . Hypothyroid   . Migraine     Patient Active Problem List   Diagnosis Date Noted  . UTI (urinary tract infection) 12/03/2013  . Fall 12/02/2013  . Hypertension   . Breast cancer ([place])   . Diabetes mellitus without complication ([place])   . Migraine   . Hypothyroid   . Epilepsy ([place])   . Esophageal reflux   . Multiple rib fractures 11/28/2013    Past Surgical History:  Procedure Laterality Date  . ABDOMINAL HYSTERECTOMY    . APPENDECTOMY    . EYE SURGERY  08/28/2015   Right eye  . KNEE SURGERY    . MASTECTOMY      Current Outpatient Rx  . Order #: [id number] Class: Print  . Order #: [id number] Class: Historical Med  . Order #: [id number] Class: Historical Med  . Order #: [id number] Class: Historical Med  . Order #: [id number] Class: Historical Med  . Order #: [id number] Class: Historical Med  . Order #: [id number] Class: Historical Med  . Order #: [id number] Class: Historical Med  . Order #:  [id number] Class: Print  . Order #: [id number] Class: Historical Med  . Order #: [id number] Class: Historical Med  . Order #: [id number] Class: Print  . Order #: [id number] Class: Historical Med  . Order #: [id number] Class: Print    Allergies Aspirin; Maxzide [hydrochlorothiazide w-triamterene]; Metformin and related; Nsaids; Other; Pravachol [pravastatin]; Stadol [butorphanol]; Toradol [ketorolac tromethamine]; Vistaril [hydroxyzine hcl]; Erythromycin; Morphine and related; and Penicillins  No family history on file.  Social History Social History  Substance Use Topics  . Smoking status: Never Smoker  . Smokeless tobacco: Never Used  . Alcohol use No    Review of Systems  Constitutional: No fever/chills Eyes: No visual changes. ENT: No sore throat. Cardiovascular: Denies chest pain. Respiratory: Denies shortness of breath. Gastrointestinal: Positive right lower abdominal pain. Positive nausea and vomiting.  No diarrhea.  No constipation. Genitourinary: Positive for dysuria, hesitancy, and urgency.  Musculoskeletal: Positive for back pain on the right. Skin: Negative for rash. Neurological: Negative for headaches, focal weakness or numbness.  10-point ROS otherwise negative.  ____________________________________________   PHYSICAL EXAM:  VITAL SIGNS: ED Triage Vitals  Enc Vitals Group     BP 10/26/15 1639 167/64     Pulse Rate 10/26/15 1639 74     Resp 10/26/15 1639 18     Temp 10/26/15 1639 98 F (36.7 C)     Temp Source 10/26/15 1639 Oral     SpO2 10/26/15 1639 100 %  Pain Score 10/26/15 1630 9   Constitutional: Alert and oriented. Well appearing and in no acute distress. Eyes: Conjunctivae are normal. Head: Atraumatic. Nose: No congestion/rhinnorhea. Mouth/Throat: Mucous membranes are moist.  Oropharynx non-erythematous. Neck: No stridor.   Cardiovascular: Normal rate, regular rhythm. Good peripheral circulation. Grossly normal heart sounds.   Respiratory:  Normal respiratory effort.  No retractions. Lungs CTAB. Gastrointestinal: Soft with RLQ tenderness to palpation. No rebound or guarding. No distention.  Musculoskeletal: No lower extremity tenderness nor edema. No gross deformities of extremities. Neurologic:  Normal speech and language. No gross focal neurologic deficits are appreciated.  Skin:  Skin is warm, dry and intact. No rash noted. Psychiatric: Mood and affect are normal. Speech and behavior are normal.  ____________________________________________   LABS (all labs ordered are listed, but only abnormal results are displayed)  Labs Reviewed  COMPREHENSIVE METABOLIC PANEL - Abnormal; Notable for the following:       Result Value   Glucose, Bld 333 (*)    BUN 26 (*)    AST 59 (*)    ALT 77 (*)    Total Bilirubin 0.2 (*)    All other components within normal limits  URINALYSIS, ROUTINE W REFLEX MICROSCOPIC (NOT AT [facility]) - Abnormal; Notable for the following:    APPearance TURBID (*)    Glucose, UA >1000 (*)    Hgb urine dipstick SMALL (*)    Protein, ur 100 (*)    Nitrite POSITIVE (*)    Leukocytes, UA MODERATE (*)    All other components within normal limits  URINE MICROSCOPIC-ADD ON - Abnormal; Notable for the following:    Bacteria, UA MANY (*)    All other components within normal limits  LIPASE, BLOOD  CBC WITH DIFFERENTIAL/PLATELET   ____________________________________________  RADIOLOGY  Ct Renal Stone Study  Result Date: 10/26/2015 CLINICAL DATA:  Right flank pain, urinary frequency and dysuria. EXAM: CT ABDOMEN AND PELVIS WITHOUT CONTRAST TECHNIQUE: Multidetector CT imaging of the abdomen and pelvis was performed following the standard protocol without IV contrast. COMPARISON:  None. FINDINGS: Lower chest: Partially visualized subglandular saline breast implants without evidence of rupture. Minimal lingular and right lower lobe subsegmental atelectasis. Minimal dependent atelectasis posteriorly at each lung  base. Normal visualized cardiac chambers size. No pericardial effusion. Hepatobiliary: No focal liver abnormality is seen. Status post cholecystectomy. No biliary dilatation. Pancreas: Unremarkable. No pancreatic ductal dilatation or surrounding inflammatory changes. Spleen: Normal in size without focal abnormality. Adrenals/Urinary Tract: There is a left pelvic kidney with normal left ureteral insertion on the bladder. There is no hydronephrosis of either kidney. There is a small parapelvic renal cyst associated with the pelvic kidney measuring 11 mm. There is also a lower pole 8 mm cyst associated with the pelvic kidney. Right kidney demonstrates an interpolar 13 mm cyst. The bladder is diffusely thickened wall suggestive of cystitis. Stomach/Bowel: Moderate fecal retention within large bowel. No small bowel obstruction. No acute inflammatory process. Appendectomy. Vascular/Lymphatic: Atherosclerotic calcifications of the abdominal aorta extending to the bifurcation with scattered calcifications of the internal iliac arteries and their branches. 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 Reproductive: Hysterectomy.  No adnexal mass. Other: None Musculoskeletal: Large superior endplate Schmorl's node of L3. Small superior endplate Schmorl's node at T9. No acute osseous abnormality. Small disc- osteophyte complexes at L2-3 and L3-4. IMPRESSION: Thick-walled appearing bladder which may explain the patient's urinary frequency and dysuria. Findings are in keeping with cystitis. Bilateral renal cysts.  No obstructive uropathy. Moderate colonic stool  burden suggesting constipation. 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 Electronically Signed   By: Ashley Royalty M.D.   On: 10/26/2015 18:02    ____________________________________________   PROCEDURES  Procedure(s) performed:   Procedures  None ____________________________________________   INITIAL IMPRESSION / ASSESSMENT AND  PLAN / ED COURSE  Pertinent labs & imaging results that were available during my care of the patient were reviewed by me and considered in my medical decision making (see chart for details).  Patient resents to the emergency department for evaluation of symptoms consistent with urinary tract infection and flank pain. Notable tenderness in the right lower quadrant. She does not have an appendix and a longer or gallbladder. Afebrile here but seems uncomfortable. Plan for noncontrast CT scan of the abdomen and pelvis to rule out stone but have lower suspicion for this. We'll treat pain obtain labs and urinalysis.  07:35 PM Patient with essentially normal CT scan. Evidence of urine infection. Normal creatinine. We will treat with Bactrim given some back pain and possibility of early pyelonephritis although lower suspicion for this discussed return precautions in detail with the patient. Plan for discharge at this time.  At this time, I do not feel there is any life-threatening condition present. I have reviewed and discussed all results (EKG, imaging, lab, urine as appropriate), exam findings with patient. I have reviewed nursing notes and appropriate previous records.  I feel the patient is safe to be discharged home without further emergent workup. Discussed usual and customary return precautions. Patient and family (if present) verbalize understanding and are comfortable with this plan.  Patient will follow-up with their primary care provider. If they do not have a primary care provider, information for follow-up has been provided to them. All questions have been answered.  ____________________________________________  FINAL CLINICAL IMPRESSION(S) / ED DIAGNOSES  Final diagnoses:  Flank pain  Acute cystitis without hematuria     MEDICATIONS GIVEN DURING THIS VISIT:  Medications  sulfamethoxazole-trimethoprim (BACTRIM DS,SEPTRA DS) 800-160 MG per tablet 1 tablet (not administered)  fentaNYL  (SUBLIMAZE) injection 50 mcg (50 mcg Intravenous Given 10/26/15 1845)  sodium chloride 0.9 % bolus 500 mL (500 mLs Intravenous New Bag/Given 10/26/15 1846)     NEW OUTPATIENT MEDICATIONS STARTED DURING THIS VISIT:  New Prescriptions   SULFAMETHOXAZOLE-TRIMETHOPRIM (BACTRIM DS,SEPTRA DS) 800-160 MG TABLET    Take 1 tablet by mouth 2 (two) times daily.      Note:  This document was prepared using Dragon voice recognition software and may include unintentional dictation errors.  Nanda Quinton, MD Emergency Medicine   Margette Fast, MD 10/26/15 [phone]

## 2015-10-26 NOTE — ED Notes (Signed)
Bed: WA04 Expected date:  Expected time:  Means of arrival:  Comments: Andrea [name] a

## 2015-10-26 NOTE — ED Triage Notes (Signed)
Per EMS, pt complains of right flank pain since Sunday. Pt states it is painful to urinate and urine has dark and urination is more frequent.

## 2015-10-26 NOTE — Discharge Instructions (Signed)

## 2015-11-23 ENCOUNTER — Emergency Department (HOSPITAL_COMMUNITY)
Admission: EM | Admit: 2015-11-23 | Discharge: 2015-11-23 | Disposition: A | Discharge status: Home / Self Care | Payer: Medicare Other | Attending: Emergency Medicine | Admitting: Emergency Medicine

## 2015-11-23 ENCOUNTER — Encounter (HOSPITAL_COMMUNITY): Payer: Self-pay

## 2015-11-23 ENCOUNTER — Emergency Department (HOSPITAL_COMMUNITY): Payer: Medicare Other

## 2015-11-23 DIAGNOSIS — R42 Dizziness and giddiness: Secondary | ICD-10-CM | POA: Diagnosis not present

## 2015-11-23 DIAGNOSIS — E039 Hypothyroidism, unspecified: Secondary | ICD-10-CM | POA: Insufficient documentation

## 2015-11-23 DIAGNOSIS — E119 Type 2 diabetes mellitus without complications: Secondary | ICD-10-CM | POA: Diagnosis not present

## 2015-11-23 DIAGNOSIS — Z794 Long term (current) use of insulin: Secondary | ICD-10-CM | POA: Insufficient documentation

## 2015-11-23 DIAGNOSIS — Z79899 Other long term (current) drug therapy: Secondary | ICD-10-CM | POA: Insufficient documentation

## 2015-11-23 DIAGNOSIS — R112 Nausea with vomiting, unspecified: Secondary | ICD-10-CM

## 2015-11-23 DIAGNOSIS — R109 Unspecified abdominal pain: Secondary | ICD-10-CM | POA: Diagnosis present

## 2015-11-23 DIAGNOSIS — N390 Urinary tract infection, site not specified: Secondary | ICD-10-CM | POA: Diagnosis not present

## 2015-11-23 DIAGNOSIS — R531 Weakness: Secondary | ICD-10-CM | POA: Insufficient documentation

## 2015-11-23 DIAGNOSIS — Z853 Personal history of malignant neoplasm of breast: Secondary | ICD-10-CM | POA: Diagnosis not present

## 2015-11-23 DIAGNOSIS — R197 Diarrhea, unspecified: Secondary | ICD-10-CM | POA: Insufficient documentation

## 2015-11-23 LAB — COMPREHENSIVE METABOLIC PANEL
ALK PHOS: 69 U/L (ref 38–126)
ALT: 18 U/L (ref 14–54)
AST: 27 U/L (ref 15–41)
Albumin: 4.1 g/dL (ref 3.5–5.0)
Anion gap: 10 (ref 5–15)
BUN: 16 mg/dL (ref 6–20)
CALCIUM: 9.3 mg/dL (ref 8.9–10.3)
CHLORIDE: 104 mmol/L (ref 101–111)
CO2: 23 mmol/L (ref 22–32)
CREATININE: 1.09 mg/dL — AB (ref 0.44–1.00)
GFR calc non Af Amer: 52 mL/min — ABNORMAL LOW (ref >60)
GLUCOSE: 200 mg/dL — AB (ref 65–99)
Potassium: 3.9 mmol/L (ref 3.5–5.1)
SODIUM: 137 mmol/L (ref 135–145)
Total Bilirubin: 0.6 mg/dL (ref 0.3–1.2)
Total Protein: 8.6 g/dL — ABNORMAL HIGH (ref 6.5–8.1)

## 2015-11-23 LAB — URINALYSIS, ROUTINE W REFLEX MICROSCOPIC
BILIRUBIN URINE: NEGATIVE
GLUCOSE, UA: 100 mg/dL — AB
KETONES UR: 40 mg/dL — AB
Nitrite: POSITIVE — AB
PROTEIN: 100 mg/dL — AB
Specific Gravity, Urine: 1.023 (ref 1.005–1.030)
pH: 6 (ref 5.0–8.0)

## 2015-11-23 LAB — URINE MICROSCOPIC-ADD ON

## 2015-11-23 LAB — CBC
HCT: 35.3 % — ABNORMAL LOW (ref 36.0–46.0)
Hemoglobin: 12.3 g/dL (ref 12.0–15.0)
MCH: 30.2 pg (ref 26.0–34.0)
MCHC: 34.8 g/dL (ref 30.0–36.0)
MCV: 86.7 fL (ref 78.0–100.0)
PLATELETS: 308 10*3/uL (ref 150–400)
RBC: 4.07 MIL/uL (ref 3.87–5.11)
RDW: 13.2 % (ref 11.5–15.5)
WBC: 8.5 10*3/uL (ref 4.0–10.5)

## 2015-11-23 LAB — LIPASE, BLOOD: LIPASE: 16 U/L (ref 11–51)

## 2015-11-23 MED ORDER — ONDANSETRON HCL 4 MG/2ML IJ SOLN
4.0000 mg | Freq: Once | INTRAMUSCULAR | Status: AC
  Administered 2015-11-23: 4 mg via INTRAVENOUS
  Filled 2015-11-23: qty 2

## 2015-11-23 MED ORDER — FENTANYL CITRATE (PF) 100 MCG/2ML IJ SOLN
50.0000 ug | Freq: Once | INTRAMUSCULAR | Status: AC
  Administered 2015-11-23: 50 ug via INTRAVENOUS
  Filled 2015-11-23: qty 2

## 2015-11-23 MED ORDER — DEXTROSE 5 % IV SOLN
1.0000 g | Freq: Once | INTRAVENOUS | Status: AC
  Administered 2015-11-23: 1 g via INTRAVENOUS
  Filled 2015-11-23: qty 10

## 2015-11-23 MED ORDER — IOPAMIDOL (ISOVUE-300) INJECTION 61%
100.0000 mL | Freq: Once | INTRAVENOUS | Status: AC | PRN
  Administered 2015-11-23: 100 mL via INTRAVENOUS

## 2015-11-23 MED ORDER — CEPHALEXIN 500 MG PO CAPS: 500.0000 mg | ORAL_CAPSULE | Freq: Three times a day (TID) | ORAL | 0 refills | Status: DC

## 2015-11-23 MED ORDER — METOCLOPRAMIDE HCL 5 MG/ML IJ SOLN
10.0000 mg | Freq: Once | INTRAMUSCULAR | Status: AC
  Administered 2015-11-23: 10 mg via INTRAVENOUS
  Filled 2015-11-23: qty 2

## 2015-11-23 MED ORDER — ONDANSETRON 8 MG PO TBDP: 8.0000 mg | ORAL_TABLET | Freq: Three times a day (TID) | ORAL | 0 refills | Status: DC | PRN

## 2015-11-23 MED ORDER — SODIUM CHLORIDE 0.9 % IV BOLUS (SEPSIS)
1000.0000 mL | Freq: Once | INTRAVENOUS | Status: AC
  Administered 2015-11-23: 1000 mL via INTRAVENOUS

## 2015-11-23 NOTE — ED Notes (Signed)
Pt requesting [place] IV at this time. Sts she only has one arm available to use and she doesn't want to mess around with multiple IV sticks.

## 2015-11-23 NOTE — ED Notes (Signed)
Pt complains of nausea, vomiting and diarrhea for three days, pt also states that she's dizzy when walking

## 2015-11-23 NOTE — ED Notes (Signed)
Pt reports that she takes meds for HTN. States she has thrown them all up.

## 2015-11-23 NOTE — ED Provider Notes (Signed)
Andrea [name] DEPT Provider Note   CSN: [id number] Arrival date & time: 11/23/15  0254     History   Chief Complaint Chief Complaint  Patient presents with  . Emesis    HPI Andrea [name] is a 65 y.o. female.  HPI Andrea [name] is a 65 y.o. female with history of prior breast cancer in remission, diabetes, hypertension, migraine headaches, seizures, presents to emergency department complaining of nausea, vomiting, diarrhea for 3 days. She reports associated abdominal pain. Denies any fever or chills. Denies any blood in her stool or emesis. Denies anyone in her family or any of her friends having similar symptoms. Patient has not tried any medications for this at home. She states her abdominal pain is diffuse, but worse in the right lower quadrant. She does report prior hysterectomy and appendectomy. She states she feels dehydrated and dizzy. She states "just feel woozy when walking." States diarrhea is liquid and malodorous. Denies any recent antibiotics or travel. No history of intestinal problems.   Past Medical History:  Diagnosis Date  . Breast cancer ([place])   . Diabetes mellitus without complication ([place])   . Epilepsy ([place])   . Esophageal reflux   . Hypertension   . Hypothyroid   . Migraine     Patient Active Problem List   Diagnosis Date Noted  . UTI (urinary tract infection) 12/03/2013  . Fall 12/02/2013  . Hypertension   . Breast cancer ([place])   . Diabetes mellitus without complication ([place])   . Migraine   . Hypothyroid   . Epilepsy ([place])   . Esophageal reflux   . Multiple rib fractures 11/28/2013    Past Surgical History:  Procedure Laterality Date  . ABDOMINAL HYSTERECTOMY    . APPENDECTOMY    . EYE SURGERY  08/28/2015   Right eye  . KNEE SURGERY    . MASTECTOMY      OB History    No data available       Home Medications    Prior to Admission medications   Medication Sig Start Date End Date Taking? Authorizing Provider    ALPRAZolam Duanne Moron) 0.5 MG tablet Take 1 tablet (0.5 mg total) by mouth 4 (four) times daily. Patient taking differently: Take 0.5 mg by mouth 4 (four) times daily as needed for anxiety.  12/06/13   Lisette Abu, PA-C  atenolol (TENORMIN) 50 MG tablet Take 50 mg by mouth at bedtime.     Historical Provider, MD  clindamycin (CLEOCIN) 150 MG capsule Take 2 capsules (300 mg total) by mouth 3 (three) times daily. Patient not taking: Reported on 10/26/2015 09/25/15   Margarita Mail, PA-C  divalproex (DEPAKOTE) 500 MG DR tablet Take 1,000 mg by mouth at bedtime.     Historical Provider, MD  glucose blood test strip 1 each by Other route 3 (three) times daily. Use as instructed    Historical Provider, MD  insulin aspart (NOVOLOG) 100 UNIT/ML injection Inject 4-12 Units into the skin 3 (three) times daily as needed for high blood sugar. Per sliding scale. For every 100 or >, uses 2 additional units.    Historical Provider, MD  insulin glargine (LANTUS) 100 UNIT/ML injection Inject 20-50 Units into the skin daily as needed (for high blood sugar if over 130).     Historical Provider, MD  levothyroxine (SYNTHROID, LEVOTHROID) 200 MCG tablet Take 200 mcg by mouth daily before breakfast.  10/02/15   Historical Provider, MD  lisinopril (PRINIVIL,ZESTRIL) 10 MG tablet Take 10 mg by  mouth at bedtime.     Historical Provider, MD  LYRICA 75 MG capsule Take 75 mg by mouth 2 (two) times daily. 10/13/15   Historical Provider, MD  oxyCODONE-acetaminophen (PERCOCET) 10-325 MG tablet Take 1 tablet by mouth every 6 (six) hours as needed for pain. 09/25/15   Margarita Mail, PA-C  promethazine (PHENERGAN) 25 MG tablet TAKE 1 TABLET (25 MG TOTAL) BY MOUTH EVERY 6 (SIX) HOURS AS NEEDED FOR NAUSEA. 10/09/15   Historical Provider, MD  topiramate (TOPAMAX) 100 MG tablet Take 100 mg by mouth at bedtime.    Historical Provider, MD    Family History History reviewed. No pertinent family history.  Social History Social History   Substance Use Topics  . Smoking status: Never Smoker  . Smokeless tobacco: Never Used  . Alcohol use No     Allergies   Aspirin; Maxzide [hydrochlorothiazide w-triamterene]; Metformin and related; Nsaids; Other; Pravachol [pravastatin]; Stadol [butorphanol]; Toradol [ketorolac tromethamine]; Vistaril [hydroxyzine hcl]; Erythromycin; Morphine and related; and Penicillins   Review of Systems Review of Systems  Constitutional: Positive for fatigue. Negative for chills and fever.  Respiratory: Negative for cough, chest tightness and shortness of breath.   Cardiovascular: Negative for chest pain, palpitations and leg swelling.  Gastrointestinal: Positive for abdominal pain, diarrhea, nausea and vomiting.  Genitourinary: Negative for dysuria, flank pain and pelvic pain.  Musculoskeletal: Negative for arthralgias, myalgias, neck pain and neck stiffness.  Skin: Negative for rash.  Neurological: Positive for dizziness, weakness and light-headedness. Negative for headaches.  All other systems reviewed and are negative.    Physical Exam Updated Vital Signs BP 185/84 (BP Location: Right Arm)   Pulse 98   Temp 98.9 F (37.2 C) (Oral)   Resp 16   Ht 5\' 5"  (1.651 m)   Wt 66.7 kg   SpO2 98%   BMI 24.46 kg/m   Physical Exam  Constitutional: She appears well-developed and well-nourished. No distress.  HENT:  Head: Normocephalic.  Oral mucosa dry  Eyes: Conjunctivae are normal.  Neck: Neck supple.  Cardiovascular: Normal rate, regular rhythm and normal heart sounds.   Pulmonary/Chest: Effort normal and breath sounds normal. No respiratory distress. She has no wheezes. She has no rales.  Abdominal: Soft. Bowel sounds are normal. She exhibits no distension. There is tenderness. There is no rebound.  Diffuse tenderness, no CVA tenderness bilaterally  Musculoskeletal: She exhibits no edema.  Neurological: She is alert.  Skin: Skin is warm and dry.  Psychiatric: She has a normal mood  and affect. Her behavior is normal.  Nursing note and vitals reviewed.    ED Treatments / Results  Labs (all labs ordered are listed, but only abnormal results are displayed) Labs Reviewed  COMPREHENSIVE METABOLIC PANEL - Abnormal; Notable for the following:       Result Value   Glucose, Bld 200 (*)    Creatinine, Ser 1.09 (*)    Total Protein 8.6 (*)    GFR calc non Af Amer 52 (*)    All other components within normal limits  CBC - Abnormal; Notable for the following:    HCT 35.3 (*)    All other components within normal limits  URINALYSIS, ROUTINE W REFLEX MICROSCOPIC (NOT AT [facility]) - Abnormal; Notable for the following:    APPearance TURBID (*)    Glucose, UA 100 (*)    Hgb urine dipstick SMALL (*)    Ketones, ur 40 (*)    Protein, ur 100 (*)    Nitrite POSITIVE (*)  Leukocytes, UA LARGE (*)    All other components within normal limits  URINE MICROSCOPIC-ADD ON - Abnormal; Notable for the following:    Squamous Epithelial / LPF 0-5 (*)    Bacteria, UA MANY (*)    Casts HYALINE CASTS (*)    All other components within normal limits  C DIFFICILE QUICK SCREEN W PCR REFLEX  URINE CULTURE  LIPASE, BLOOD    EKG  EKG Interpretation None       Radiology No results found.  Procedures Procedures (including critical care time)  Medications Ordered in ED Medications  sodium chloride 0.9 % bolus 1,000 mL (not administered)  ondansetron (ZOFRAN) injection 4 mg (not administered)  fentaNYL (SUBLIMAZE) injection 50 mcg (not administered)     Initial Impression / Assessment and Plan / ED Course  I have reviewed the triage vital signs and the nursing notes.  Pertinent labs & imaging results that were available during my care of the patient were reviewed by me and considered in my medical decision making (see chart for details).  Clinical Course    Patient emergency department with 3 days of nausea, vomiting, diarrhea. Reports associated abdominal pain.  Significant tenderness on examination. Labs already obtained in triage, showing normal white blood cell count of 8.5, glucose elevated at 200, creatinine is 1.09, otherwise normal. Will hydrate with fluids, patient does appear to be dry. Will do CT abdomen and pelvis for evaluation of her abdominal   9:37 AM CT scan with no acute findings. Patient does have nitrite positive and large leukocyte urinalysis. 2 numerous to count white blood cells and many bacteria. Is concerning for UTI. She does not have any flank pain or tenderness. Doubt pyelonephritis. We'll treat with Rocephin 1 g IV in emergency department. She states she is still having some nausea but symptoms did improve. We'll give Reglan, and do a PO trial.   10:45 AM Pt tolerating PO fluids. She feels better. Will dc home with zofran and keflex. PCP follow up in 2-3 days for recheck. Return precautions discussed. At this time, normal labs and electrolytes. No evidence of sepsis based on labs and VS. Hypertensive, did not take her BP medications.   Vitals:   11/23/15 0256 11/23/15 0612 11/23/15 0900  BP: 169/100 185/84 (!) 204/86  Pulse: 111 98 99  Resp: 20 16 18   Temp: 98.2 F (36.8 C) 98.9 F (37.2 C)   TempSrc: Oral Oral   SpO2: 95% 98% 97%  Weight: 66.7 kg    Height: 5\' 5"  (1.651 m)      Final Clinical Impressions(s) / ED Diagnoses   Final diagnoses:  Nausea vomiting and diarrhea  Urinary tract infection without hematuria, site unspecified    New Prescriptions New Prescriptions   CEPHALEXIN (KEFLEX) 500 MG CAPSULE    Take 1 capsule (500 mg total) by mouth 3 (three) times daily.   ONDANSETRON (ZOFRAN ODT) 8 MG DISINTEGRATING TABLET    Take 1 tablet (8 mg total) by mouth every 8 (eight) hours as needed for nausea or vomiting.     Jeannett Senior, PA-C 11/23/15 1049    Davonna Belling, MD 11/23/15 1459

## 2015-11-23 NOTE — ED Notes (Signed)
Patient transported to CT 

## 2015-11-23 NOTE — Discharge Instructions (Signed)
Drink plenty of fluids. Take Zofran as prescribed as needed for nausea and vomiting. Continue to take all of your medications. Take Keflex as prescribed until all gone for urinary tract infection. Follow-up with family doctor in 2-3 days for recheck. Return if worsening symptoms or unable to keep her medications down.

## 2015-11-23 NOTE — ED Notes (Signed)
Pt unable to give urine sample at this time. Sts she just went to bathroom before being brought back to room, pt doesn't want to get up and try at this time. Sts "I can't pee on demand." Pt sts she will try in a half hour or so.

## 2015-11-25 LAB — URINE CULTURE

## 2015-11-26 ENCOUNTER — Telehealth (HOSPITAL_BASED_OUTPATIENT_CLINIC_OR_DEPARTMENT_OTHER): Payer: Self-pay

## 2015-11-26 NOTE — Progress Notes (Signed)
ED Antimicrobial Stewardship Positive Culture Follow Up   Andrea [name] is an 65 y.o. female who presented to [facility] on 11/23/2015 with a chief complaint of  Chief Complaint  Patient presents with  . Emesis    Recent Results (from the past 720 hour(s))  Urine culture     Status: Abnormal   Collection Time: 11/23/15  7:14 AM  Result Value Ref Range Status   Specimen Description URINE, RANDOM  Final   Special Requests NONE  Final   Culture (A)  Final    >=100,000 COLONIES/mL ESCHERICHIA COLI Confirmed Extended Spectrum Beta-Lactamase Producer (ESBL) Performed at [facility]    Report Status 11/25/2015 FINAL  Final   Organism ID, Bacteria ESCHERICHIA COLI (A)  Final      Susceptibility   Escherichia coli - MIC*    AMPICILLIN >=32 RESISTANT Resistant     CEFAZOLIN >=64 RESISTANT Resistant     CEFTRIAXONE >=64 RESISTANT Resistant     CIPROFLOXACIN >=4 RESISTANT Resistant     GENTAMICIN <=1 SENSITIVE Sensitive     IMIPENEM <=0.25 SENSITIVE Sensitive     NITROFURANTOIN <=16 SENSITIVE Sensitive     TRIMETH/SULFA >=320 RESISTANT Resistant     AMPICILLIN/SULBACTAM 8 SENSITIVE Sensitive     PIP/TAZO <=4 SENSITIVE Sensitive     Extended ESBL POSITIVE Resistant     * >=100,000 COLONIES/mL ESCHERICHIA COLI    [x]  Treated with cephalexin, organism resistant to prescribed antimicrobial  New antibiotic prescription: Stop cephalexin. Start nitrofurantoin 100 mg BID x 5 days  ED Provider: Jeannett Senior, PA-C  Dimitri Ped, PharmD. PGY-2 Infectious Diseases Pharmacy Resident Pager: [phone] 11/26/2015, 9:02 AM

## 2015-11-26 NOTE — Telephone Encounter (Signed)
Post ED Visit - Positive Culture Follow-up: Unsuccessful Patient Follow-up  Culture assessed and recommendations reviewed by: []  Elenor Quinones, Pharm.D. []  Heide Guile, Pharm.D., BCPS []  Parks Neptune, Pharm.D. []  Alycia Rossetti, Pharm.D., BCPS []  [place], Pharm.D., BCPS, AAHIVP []  Legrand Como, Pharm.D., BCPS, AAHIVP []  Milus Glazier, Pharm.D. []  Rob [place], [place].D. Dimitri Ped Pharm D Positive urine culture  []  Patient discharged without antimicrobial prescription and treatment is now indicated [x]  Organism is resistant to prescribed ED discharge antimicrobial []  Patient with positive blood cultures   Unable to contact patient after 3 attempts, letter will be sent to address on file  Genia Del 11/26/2015, 11:45 AM

## 2015-11-30 ENCOUNTER — Emergency Department (HOSPITAL_COMMUNITY)
Admission: EM | Admit: 2015-11-30 | Discharge: 2015-11-30 | Disposition: A | Discharge status: Home / Self Care | Payer: Medicare Other | Attending: Emergency Medicine | Admitting: Emergency Medicine

## 2015-11-30 ENCOUNTER — Emergency Department (HOSPITAL_COMMUNITY): Payer: Medicare Other

## 2015-11-30 ENCOUNTER — Encounter (HOSPITAL_COMMUNITY): Payer: Self-pay | Admitting: Emergency Medicine

## 2015-11-30 DIAGNOSIS — E039 Hypothyroidism, unspecified: Secondary | ICD-10-CM | POA: Insufficient documentation

## 2015-11-30 DIAGNOSIS — Z794 Long term (current) use of insulin: Secondary | ICD-10-CM | POA: Diagnosis not present

## 2015-11-30 DIAGNOSIS — R0602 Shortness of breath: Secondary | ICD-10-CM | POA: Insufficient documentation

## 2015-11-30 DIAGNOSIS — N390 Urinary tract infection, site not specified: Secondary | ICD-10-CM | POA: Insufficient documentation

## 2015-11-30 DIAGNOSIS — Z853 Personal history of malignant neoplasm of breast: Secondary | ICD-10-CM | POA: Diagnosis not present

## 2015-11-30 DIAGNOSIS — R079 Chest pain, unspecified: Secondary | ICD-10-CM | POA: Insufficient documentation

## 2015-11-30 DIAGNOSIS — I10 Essential (primary) hypertension: Secondary | ICD-10-CM | POA: Diagnosis not present

## 2015-11-30 DIAGNOSIS — E119 Type 2 diabetes mellitus without complications: Secondary | ICD-10-CM | POA: Diagnosis not present

## 2015-11-30 DIAGNOSIS — Z79899 Other long term (current) drug therapy: Secondary | ICD-10-CM | POA: Insufficient documentation

## 2015-11-30 HISTORY — DX: Unspecified convulsions: R56.9

## 2015-11-30 LAB — BASIC METABOLIC PANEL
ANION GAP: 8 (ref 5–15)
BUN: 8 mg/dL (ref 6–20)
CHLORIDE: 103 mmol/L (ref 101–111)
CO2: 26 mmol/L (ref 22–32)
Calcium: 9.2 mg/dL (ref 8.9–10.3)
Creatinine, Ser: 0.91 mg/dL (ref 0.44–1.00)
GFR calc non Af Amer: >60 mL/min (ref >60)
Glucose, Bld: 194 mg/dL — ABNORMAL HIGH (ref 65–99)
POTASSIUM: 3.8 mmol/L (ref 3.5–5.1)
SODIUM: 137 mmol/L (ref 135–145)

## 2015-11-30 LAB — URINALYSIS, ROUTINE W REFLEX MICROSCOPIC
Bilirubin Urine: NEGATIVE
Glucose, UA: NEGATIVE mg/dL
Hgb urine dipstick: NEGATIVE
Ketones, ur: 15 mg/dL — AB
Nitrite: POSITIVE — AB
Protein, ur: 30 mg/dL — AB
Specific Gravity, Urine: 1.015 (ref 1.005–1.030)
pH: 7 (ref 5.0–8.0)

## 2015-11-30 LAB — HEPATIC FUNCTION PANEL
ALT: 26 U/L (ref 14–54)
AST: 38 U/L (ref 15–41)
Albumin: 3.3 g/dL — ABNORMAL LOW (ref 3.5–5.0)
Alkaline Phosphatase: 78 U/L (ref 38–126)
Bilirubin, Direct: <0.1 mg/dL — ABNORMAL LOW (ref 0.1–0.5)
Total Bilirubin: 0.4 mg/dL (ref 0.3–1.2)
Total Protein: 7.6 g/dL (ref 6.5–8.1)

## 2015-11-30 LAB — I-STAT TROPONIN, ED
Troponin i, poc: 0 ng/mL (ref 0.00–0.08)
Troponin i, poc: 0 ng/mL (ref 0.00–0.08)

## 2015-11-30 LAB — CBC
HEMATOCRIT: 33.6 % — AB (ref 36.0–46.0)
HEMOGLOBIN: 11.8 g/dL — AB (ref 12.0–15.0)
MCH: 30.3 pg (ref 26.0–34.0)
MCHC: 35.1 g/dL (ref 30.0–36.0)
MCV: 86.2 fL (ref 78.0–100.0)
PLATELETS: 365 10*3/uL (ref 150–400)
RBC: 3.9 MIL/uL (ref 3.87–5.11)
RDW: 13.5 % (ref 11.5–15.5)
WBC: 6.7 10*3/uL (ref 4.0–10.5)

## 2015-11-30 LAB — URINE MICROSCOPIC-ADD ON: RBC / HPF: NONE SEEN RBC/hpf (ref 0–5)

## 2015-11-30 LAB — D-DIMER, QUANTITATIVE (NOT AT ARMC): D-Dimer, Quant: 1.38 ug/mL-FEU — ABNORMAL HIGH (ref 0.00–0.50)

## 2015-11-30 LAB — BRAIN NATRIURETIC PEPTIDE: B Natriuretic Peptide: 71.5 pg/mL (ref 0.0–100.0)

## 2015-11-30 MED ORDER — ONDANSETRON HCL 4 MG PO TABS: 4.0000 mg | ORAL_TABLET | Freq: Four times a day (QID) | ORAL | 0 refills | Status: DC

## 2015-11-30 MED ORDER — NITROFURANTOIN MONOHYD MACRO 100 MG PO CAPS: 100.0000 mg | ORAL_CAPSULE | Freq: Two times a day (BID) | ORAL | 0 refills | Status: DC

## 2015-11-30 MED ORDER — SODIUM CHLORIDE 0.9 % IV BOLUS (SEPSIS)
1000.0000 mL | Freq: Once | INTRAVENOUS | Status: AC
  Administered 2015-11-30: 1000 mL via INTRAVENOUS

## 2015-11-30 MED ORDER — OMEPRAZOLE 20 MG PO CPDR: 20.0000 mg | DELAYED_RELEASE_CAPSULE | Freq: Every day | ORAL | 0 refills | Status: DC

## 2015-11-30 MED ORDER — PROMETHAZINE HCL 25 MG RE SUPP: 25.0000 mg | Freq: Four times a day (QID) | RECTAL | 0 refills | Status: DC | PRN

## 2015-11-30 MED ORDER — ONDANSETRON HCL 4 MG/2ML IJ SOLN: 4.0000 mg | Freq: Once | INTRAMUSCULAR | Status: DC

## 2015-11-30 MED ORDER — ONDANSETRON HCL 4 MG/2ML IJ SOLN
4.0000 mg | Freq: Once | INTRAMUSCULAR | Status: AC
  Administered 2015-11-30: 4 mg via INTRAVENOUS
  Filled 2015-11-30: qty 2

## 2015-11-30 MED ORDER — GI COCKTAIL ~~LOC~~
30.0000 mL | Freq: Once | ORAL | Status: AC
  Administered 2015-11-30: 30 mL via ORAL
  Filled 2015-11-30: qty 30

## 2015-11-30 MED ORDER — HYDROMORPHONE HCL 2 MG/ML IJ SOLN
0.5000 mg | Freq: Once | INTRAMUSCULAR | Status: AC
  Administered 2015-11-30: 0.5 mg via INTRAVENOUS
  Filled 2015-11-30: qty 1

## 2015-11-30 MED ORDER — FAMOTIDINE IN NACL 20-0.9 MG/50ML-% IV SOLN
20.0000 mg | Freq: Once | INTRAVENOUS | Status: AC
  Administered 2015-11-30: 20 mg via INTRAVENOUS
  Filled 2015-11-30: qty 50

## 2015-11-30 MED ORDER — PROMETHAZINE HCL 25 MG/ML IJ SOLN
12.5000 mg | Freq: Once | INTRAMUSCULAR | Status: AC
  Administered 2015-11-30: 12.5 mg via INTRAVENOUS
  Filled 2015-11-30: qty 1

## 2015-11-30 MED ORDER — NITROFURANTOIN MONOHYD MACRO 100 MG PO CAPS
100.0000 mg | ORAL_CAPSULE | Freq: Once | ORAL | Status: AC
  Administered 2015-11-30: 100 mg via ORAL
  Filled 2015-11-30: qty 1

## 2015-11-30 MED ORDER — IOPAMIDOL (ISOVUE-370) INJECTION 76%
INTRAVENOUS | Status: AC
  Administered 2015-11-30: 100 mL
  Filled 2015-11-30: qty 100

## 2015-11-30 MED ORDER — SUCRALFATE 1 GM/10ML PO SUSP
1.0000 g | Freq: Three times a day (TID) | ORAL | 0 refills | Status: DC
Start: 2015-11-30 — End: 2017-01-08

## 2015-11-30 NOTE — ED Notes (Signed)
Patient transported to X-ray 

## 2015-11-30 NOTE — ED Provider Notes (Signed)
Recently started on keflex for uti. Returns for chest pain/epigastric pain. UTI worse today. CTx resistent to abx, switched to macrobid. Had nausea and vomiting right after taking abx. Given 1L of fluid and phenergan. Zofran did not help at home. Nausea improved. She was given a PO challenge and she tolerated PO liquids without difficulty. She was then discharged home in stable condition.    Zenovia Jarred, DO 11/30/15 1640    Blanchie Dessert, MD 12/01/15 2032    Blanchie Dessert, MD 12/01/15 2032

## 2015-11-30 NOTE — ED Triage Notes (Signed)
Pt from home via [facility]. Pt sts she started having CP yesterday around lunch time. Sts the pain starts from her mid chest and radiated to left arm and neck area. Denies SHOB. Endorses nausea and dizziness. Rates pain @ 7/10. A&O x4 on arrival.

## 2015-11-30 NOTE — ED Notes (Signed)
When preparing to discharge patient, patient vomiting. PA notified. Pt concerned about going home and being unable to keep medications down.

## 2015-11-30 NOTE — ED Notes (Signed)
Patient transported to CT 

## 2015-11-30 NOTE — ED Notes (Signed)
Pt wheeled to restroom with this RN.

## 2015-11-30 NOTE — ED Notes (Signed)
Two IV attempts with no success, did attempt with ultrasound by [facility]

## 2015-11-30 NOTE — ED Notes (Signed)
Alex PA at bedside   

## 2015-11-30 NOTE — Discharge Instructions (Signed)
Medications: Macrobid, Phenergan, Carafate, Prilosec  Treatment: Take Macrobid as prescribed for 5 days for your urinary tract infection. Take Phenergan every 6 hours as needed for nausea and vomiting. Take Carafate before meals and before bed as prescribed. Take Prilosec as prescribed. Begin eating bland foods slowly and make sure to drink plenty of fluids.  Follow-up: Please follow up with your primary care provider as soon as possible for follow-up and further evaluation and treatment of your symptoms. Please follow-up with your gastroenterologist or the gastroenterologist indicated below for further evaluation and treatment of your symptoms as well as a colonoscopy and possible endoscopy. Please return to emergency department if you develop any new or worsening symptoms.

## 2015-11-30 NOTE — ED Notes (Signed)
PA Alex made aware of BP 198/83

## 2015-11-30 NOTE — ED Notes (Signed)
Pt ambulated to restroom, pt tolerated well.  

## 2015-11-30 NOTE — ED Notes (Signed)
Pt ambulated to restroom with Apolonio Schneiders NT

## 2015-11-30 NOTE — ED Notes (Signed)
Pt ambulated to restroom. 

## 2015-11-30 NOTE — ED Provider Notes (Signed)
[place] DEPT Provider Note   CSN: [id number] Arrival date & time: 11/30/15  8546     History   Chief Complaint Chief Complaint  Patient presents with  . Chest Pain    HPI Andrea [name] is a 65 y.o. female with history of hypertension, diabetes, esophageal reflux, and recently treated UTI who presents with chest pain that began around noon yesterday. Patient describes her pain as a sharp pain to the left side of her chest. The pain is not constant and comes in episodes a few seconds and resolves completely. The pain is not pleuritic. She has pain that radiates to her jaw and left arm. She describes the pain radiating to her arm as a pins and needles feeling. Patient has had some associated nausea, but no vomiting. She has had some associated shortness of breath, however she states that it was related to anxiety when thinking about the possibility of what could be happening. Patient denies any diaphoresis. Patient denies any abdominal pain, vomiting. Patient continues to have some dysuria. Patient was treated one week ago for UTI and states she has a few pills left of her antibiotic that she has not finished yet. Her vomiting and diarrhea have stopped, however. Patient reports taking oxycodone around 1 AM with temporary relief of her pain. Patient denies any recent long trips, recent surgeries, cancer (breast cancer 3 years ago), estrogen use, new leg pain or swelling. Patient does not smoke. Family history is unknown because patient's parents died in an MVC when she was 64.  HPI  Past Medical History:  Diagnosis Date  . Breast cancer ([place])   . Diabetes mellitus without complication ([place])   . Esophageal reflux   . Hypertension   . Hypothyroid   . Migraine   . Seizure [facility])     Patient Active Problem List   Diagnosis Date Noted  . UTI (urinary tract infection) 12/03/2013  . Fall 12/02/2013  . Hypertension   . Breast cancer ([place])   . Diabetes mellitus without  complication ([place])   . Migraine   . Hypothyroid   . Epilepsy ([place])   . Esophageal reflux   . Multiple rib fractures 11/28/2013    Past Surgical History:  Procedure Laterality Date  . ABDOMINAL HYSTERECTOMY    . APPENDECTOMY    . CHOLECYSTECTOMY    . EYE SURGERY  08/28/2015   Right eye  . KNEE SURGERY    . MASTECTOMY      OB History    No data available       Home Medications    Prior to Admission medications   Medication Sig Start Date End Date Taking? Authorizing Provider  ALPRAZolam Duanne Moron) 0.5 MG tablet Take 1 tablet (0.5 mg total) by mouth 4 (four) times daily. Patient taking differently: Take 0.5 mg by mouth 4 (four) times daily as needed for anxiety.  12/06/13  Yes Lisette Abu, PA-C  atenolol (TENORMIN) 50 MG tablet Take 50 mg by mouth 2 (two) times daily.    Yes Historical Provider, MD  cephALEXin (KEFLEX) 500 MG capsule Take 1 capsule (500 mg total) by mouth 3 (three) times daily. 11/23/15  Yes Tatyana Kirichenko, PA-C  divalproex (DEPAKOTE) 500 MG DR tablet Take 1,000 mg by mouth at bedtime.    Yes Historical Provider, MD  glucose blood test strip 1 each by Other route 3 (three) times daily. Use as instructed   Yes Historical Provider, MD  insulin glargine (LANTUS) 100 UNIT/ML injection Inject 20-50 Units  into the skin daily as needed (for high blood sugar if over 130).    Yes Historical Provider, MD  levothyroxine (SYNTHROID, LEVOTHROID) 200 MCG tablet Take 200 mcg by mouth at bedtime.  10/02/15  Yes Historical Provider, MD  lisinopril (PRINIVIL,ZESTRIL) 10 MG tablet Take 10 mg by mouth at bedtime.    Yes Historical Provider, MD  LYRICA 75 MG capsule Take 75 mg by mouth 2 (two) times daily. 10/13/15  Yes Historical Provider, MD  oxyCODONE-acetaminophen (PERCOCET) 10-325 MG tablet Take 1 tablet by mouth every 6 (six) hours as needed for pain. 09/25/15  Yes Margarita Mail, PA-C  topiramate (TOPAMAX) 100 MG tablet Take 200 mg by mouth at bedtime.   Yes Historical  Provider, MD  clindamycin (CLEOCIN) 150 MG capsule Take 2 capsules (300 mg total) by mouth 3 (three) times daily. Patient not taking: Reported on 11/30/2015 09/25/15   Margarita Mail, PA-C  nitrofurantoin, macrocrystal-monohydrate, (MACROBID) 100 MG capsule Take 1 capsule (100 mg total) by mouth 2 (two) times daily. 11/30/15   Frederica Kuster, PA-C  omeprazole (PRILOSEC) 20 MG capsule Take 1 capsule (20 mg total) by mouth daily. 11/30/15   Laksh Hinners M Marquet Faircloth, PA-C  ondansetron (ZOFRAN ODT) 8 MG disintegrating tablet Take 1 tablet (8 mg total) by mouth every 8 (eight) hours as needed for nausea or vomiting. Patient not taking: Reported on 11/30/2015 11/23/15   Tatyana Kirichenko, PA-C  promethazine (PHENERGAN) 25 MG suppository Place 1 suppository (25 mg total) rectally every 6 (six) hours as needed for nausea or vomiting. 11/30/15   Frederica Kuster, PA-C  sucralfate (CARAFATE) 1 GM/10ML suspension Take 10 mLs (1 g total) by mouth 4 (four) times daily -  with meals and at bedtime. 11/30/15   Frederica Kuster, PA-C  topiramate (TOPAMAX) 50 MG tablet Take 50 mg by mouth 3 (three) times daily.    Historical Provider, MD    Family History History reviewed. No pertinent family history.  Social History Social History  Substance Use Topics  . Smoking status: Never Smoker  . Smokeless tobacco: Never Used  . Alcohol use No     Allergies   Aspirin; Maxzide [hydrochlorothiazide w-triamterene]; Metformin and related; Nsaids; Other; Pravachol [pravastatin]; Stadol [butorphanol]; Toradol [ketorolac tromethamine]; Vistaril [hydroxyzine hcl]; Erythromycin; Morphine and related; and Penicillins   Review of Systems Review of Systems  Constitutional: Negative for chills and fever.  HENT: Negative for facial swelling and sore throat.   Respiratory: Positive for shortness of breath.   Cardiovascular: Positive for chest pain.  Gastrointestinal: Positive for nausea. Negative for abdominal pain and vomiting.    Genitourinary: Positive for dysuria.  Musculoskeletal: Negative for back pain.  Skin: Negative for rash and wound.  Neurological: Negative for headaches.  Psychiatric/Behavioral: The patient is not nervous/anxious.      Physical Exam Updated Vital Signs BP 194/88   Pulse 79   Temp 98.7 F (37.1 C) (Oral)   Resp 17   Ht 5\' 5"  (1.651 m)   Wt 67.6 kg   SpO2 98%   BMI 24.79 kg/m   Physical Exam  Constitutional: She appears well-developed and well-nourished. No distress.  HENT:  Head: Normocephalic and atraumatic.  Mouth/Throat: Oropharynx is clear and moist. No oropharyngeal exudate.  Eyes: Conjunctivae are normal. Pupils are equal, round, and reactive to light. Right eye exhibits no discharge. Left eye exhibits no discharge. No scleral icterus.  Neck: Normal range of motion. Neck supple. No thyromegaly present.  Cardiovascular: Normal rate, regular rhythm, normal heart  sounds and intact distal pulses.  Exam reveals no gallop and no friction rub.   No murmur heard. Pulmonary/Chest: Effort normal and breath sounds normal. No stridor. No respiratory distress. She has no wheezes. She has no rales. She exhibits tenderness.    Abdominal: Soft. Bowel sounds are normal. She exhibits no distension. There is tenderness in the epigastric area and left upper quadrant. There is no rebound, no guarding and no tenderness at McBurney's point.  Patient reports this abdominal tenderness was present last week, as well  Musculoskeletal: She exhibits no edema.  No calf TTP bilaterally  Lymphadenopathy:    She has no cervical adenopathy.  Neurological: She is alert. Coordination normal.  Skin: Skin is warm and dry. No rash noted. She is not diaphoretic. No pallor.  Psychiatric: She has a normal mood and affect.  Nursing note and vitals reviewed.    ED Treatments / Results  Labs (all labs ordered are listed, but only abnormal results are displayed) Labs Reviewed  BASIC METABOLIC PANEL -  Abnormal; Notable for the following:       Result Value   Glucose, Bld 194 (*)    All other components within normal limits  CBC - Abnormal; Notable for the following:    Hemoglobin 11.8 (*)    HCT 33.6 (*)    All other components within normal limits  URINALYSIS, ROUTINE W REFLEX MICROSCOPIC (NOT AT [facility]) - Abnormal; Notable for the following:    APPearance CLOUDY (*)    Ketones, ur 15 (*)    Protein, ur 30 (*)    Nitrite POSITIVE (*)    Leukocytes, UA LARGE (*)    All other components within normal limits  HEPATIC FUNCTION PANEL - Abnormal; Notable for the following:    Albumin 3.3 (*)    Bilirubin, Direct <0.1 (*)    All other components within normal limits  URINE MICROSCOPIC-ADD ON - Abnormal; Notable for the following:    Squamous Epithelial / LPF 6-30 (*)    Bacteria, UA MANY (*)    All other components within normal limits  D-DIMER, QUANTITATIVE (NOT AT [facility]) - Abnormal; Notable for the following:    D-Dimer, Quant 1.38 (*)    All other components within normal limits  BRAIN NATRIURETIC PEPTIDE  I-STAT TROPOININ, ED  I-STAT TROPOININ, ED    EKG  EKG Interpretation  Date/Time:  Thursday November 30 2015 06:49:11 EST Ventricular Rate:  80 PR Interval:    QRS Duration: 82 QT Interval:  391 QTC Calculation: 451 R Axis:   37 Text Interpretation:  Sinus rhythm No significant change was found Confirmed by Wyvonnia Dusky  MD, STEPHEN [phone]) on 11/30/2015 6:52:42 AM       Radiology Dg Chest 2 View  Result Date: 11/30/2015 CLINICAL DATA:  Chest hand Left arm pain for several days EXAM: CHEST  2 VIEW COMPARISON:  11/30/2013 FINDINGS: The heart size and mediastinal contours are within normal limits. Both lungs are clear. The visualized skeletal structures are unremarkable. Bilateral breast implants are noted. IMPRESSION: No active cardiopulmonary disease. Electronically Signed   By: Inez Catalina M.D.   On: 11/30/2015 08:01   Ct Angio Chest Pe W And/or Wo Contrast  Result  Date: 11/30/2015 CLINICAL DATA:  Elevated D-dimer, chest pain, history of breast cancer EXAM: CT ANGIOGRAPHY CHEST WITH CONTRAST TECHNIQUE: Multidetector CT imaging of the chest was performed using the standard protocol during bolus administration of intravenous contrast. Multiplanar CT image reconstructions and MIPs were obtained to evaluate the  vascular anatomy. CONTRAST:  100 cc Isovue COMPARISON:  None. FINDINGS: Cardiovascular: The study is of excellent technical quality. Heart size within normal limits. No pericardial effusion. No pulmonary embolus is noted. Atherosclerotic calcifications of thoracic aorta. Atherosclerotic calcifications of upper abdominal aorta. Mediastinum/Nodes: No mediastinal hematoma or adenopathy. No hilar adenopathy. Lungs/Pleura: Images of the lung parenchyma shows no infiltrate or pulmonary edema. No focal consolidation. No pulmonary nodules. There is no pneumothorax. No bronchiectasis. Upper Abdomen: The visualized upper abdomen shows mild fatty infiltration of the liver. No adrenal gland mass is noted. Musculoskeletal: Bilateral breast implants are noted. No destructive bony lesions are noted. Sagittal images of the spine shows diffuse osteopenia. Mild degenerative changes thoracolumbar spine. There is mild compression deformity upper endplate of T7 vertebral body. Mild to moderate compression deformity upper endplate of T8 vertebral body. Probable Schmorl's node deformity upper endplate of T8 and T9 vertebral body. Review of the MIP images confirms the above findings. IMPRESSION: 1. No pulmonary embolus.  No infiltrate or pulmonary edema. 2. No mediastinal hematoma or adenopathy. 3. Atherosclerotic vascular calcifications are noted. Bilateral breast implants. 4. Mild compression deformity upper endplate of T7 and T8 vertebral body of indeterminate age. Clinical correlation is necessary. Electronically Signed   By: Lahoma Crocker M.D.   On: 11/30/2015 14:08    Procedures Procedures  (including critical care time)  Medications Ordered in ED Medications  sodium chloride 0.9 % bolus 1,000 mL (not administered)  sodium chloride 0.9 % bolus 1,000 mL (0 mLs Intravenous Stopped 11/30/15 1158)  gi cocktail (Maalox,Lidocaine,Donnatal) (30 mLs Oral Given 11/30/15 0821)  famotidine (PEPCID) IVPB 20 mg premix (0 mg Intravenous Stopped 11/30/15 0918)  ondansetron (ZOFRAN) injection 4 mg (4 mg Intravenous Given 11/30/15 1003)  HYDROmorphone (DILAUDID) injection 0.5 mg (0.5 mg Intravenous Given 11/30/15 1003)  nitrofurantoin (macrocrystal-monohydrate) (MACROBID) capsule 100 mg (100 mg Oral Given 11/30/15 1436)  ondansetron (ZOFRAN) injection 4 mg (4 mg Intravenous Given 11/30/15 1436)  iopamidol (ISOVUE-370) 76 % injection (100 mLs  Contrast Given 11/30/15 1350)  promethazine (PHENERGAN) injection 12.5 mg (12.5 mg Intravenous Given 11/30/15 1517)     Initial Impression / Assessment and Plan / ED Course  I have reviewed the triage vital signs and the nursing notes.  Pertinent labs & imaging results that were available during my care of the patient were reviewed by me and considered in my medical decision making (see chart for details).  Clinical Course     HEAR score 3. Suspect abdominal cause for pain. Doubt ACS or PE.Dr. troponin negative. D-dimer 1.38, however CT angios shows no PE. CBC shows hemoglobin 11.8. BMP shows glucose 194. Hepatic function panel shows albumin 3.3, bilirubin <0.1. BNP 71.5. UA shows 15 ketones, 30 protein, positive nitrites, large leukocytes, many bacteria, too numerous to count WBCs. CXR shows no active cardiopulmonary disease. EKG shows NSR, no significant changes found. The patient's urine culture showed resistance to Rocephin, proving Keflex as unsuccessful. Will change patient to [place]. Also discharged patient home with Phenergan, Carafate, omeprazole with follow-up to PCP and GI. At discharge, the patient began vomiting following the Zofran administration  and first dose of Macrobid. Phenergan ordered and PO challenge initiated. At shift change, patient care transferred to Rockne Menghini, resident MD, for continued evaluation, follow up of PO challenge and determination of disposition. Anticipate discharge if can tolerate PO.I discussed patient case with Dr. Oleta Mouse who guided in the patient's management and agrees with plan.   Final Clinical Impressions(s) / ED Diagnoses   Final  diagnoses:  Nonspecific chest pain  Urinary tract infection without hematuria, site unspecified    New Prescriptions New Prescriptions   NITROFURANTOIN, MACROCRYSTAL-MONOHYDRATE, (MACROBID) 100 MG CAPSULE    Take 1 capsule (100 mg total) by mouth 2 (two) times daily.   OMEPRAZOLE (PRILOSEC) 20 MG CAPSULE    Take 1 capsule (20 mg total) by mouth daily.   PROMETHAZINE (PHENERGAN) 25 MG SUPPOSITORY    Place 1 suppository (25 mg total) rectally every 6 (six) hours as needed for nausea or vomiting.   SUCRALFATE (CARAFATE) 1 GM/10ML SUSPENSION    Take 10 mLs (1 g total) by mouth 4 (four) times daily -  with meals and at bedtime.     Frederica Kuster, PA-C 11/30/15 Hatch Liu, MD 11/30/15 [phone]

## 2015-11-30 NOTE — ED Notes (Signed)
Pt ambulated to restroom. Pt tolerated well.  

## 2015-12-25 ENCOUNTER — Telehealth (HOSPITAL_BASED_OUTPATIENT_CLINIC_OR_DEPARTMENT_OTHER): Payer: Self-pay | Admitting: Emergency Medicine

## 2015-12-25 NOTE — Telephone Encounter (Signed)
No response to letter, LOST TO FOLLOWUP 

## 2016-03-08 ENCOUNTER — Ambulatory Visit
Admission: RE | Admit: 2016-03-08 | Discharge: 2016-03-08 | Disposition: A | Discharge status: Home / Self Care | Payer: Medicare Other | Source: Ambulatory Visit | Attending: Anesthesiology | Admitting: Anesthesiology

## 2016-03-08 ENCOUNTER — Other Ambulatory Visit: Payer: Self-pay | Admitting: Anesthesiology

## 2016-03-08 DIAGNOSIS — M545 Low back pain: Secondary | ICD-10-CM

## 2016-07-17 ENCOUNTER — Emergency Department (HOSPITAL_COMMUNITY)
Admission: EM | Admit: 2016-07-17 | Discharge: 2016-07-17 | Disposition: A | Discharge status: Home / Self Care | Payer: Medicare Other | Attending: Emergency Medicine | Admitting: Emergency Medicine

## 2016-07-17 ENCOUNTER — Emergency Department (HOSPITAL_COMMUNITY): Payer: Medicare Other

## 2016-07-17 ENCOUNTER — Encounter (HOSPITAL_COMMUNITY): Payer: Self-pay | Admitting: Emergency Medicine

## 2016-07-17 DIAGNOSIS — Z7984 Long term (current) use of oral hypoglycemic drugs: Secondary | ICD-10-CM | POA: Diagnosis not present

## 2016-07-17 DIAGNOSIS — Y93E9 Activity, other interior property and clothing maintenance: Secondary | ICD-10-CM | POA: Diagnosis not present

## 2016-07-17 DIAGNOSIS — Y92019 Unspecified place in single-family (private) house as the place of occurrence of the external cause: Secondary | ICD-10-CM | POA: Insufficient documentation

## 2016-07-17 DIAGNOSIS — I10 Essential (primary) hypertension: Secondary | ICD-10-CM | POA: Insufficient documentation

## 2016-07-17 DIAGNOSIS — Z7902 Long term (current) use of antithrombotics/antiplatelets: Secondary | ICD-10-CM | POA: Diagnosis not present

## 2016-07-17 DIAGNOSIS — X500XXA Overexertion from strenuous movement or load, initial encounter: Secondary | ICD-10-CM | POA: Insufficient documentation

## 2016-07-17 DIAGNOSIS — E039 Hypothyroidism, unspecified: Secondary | ICD-10-CM | POA: Insufficient documentation

## 2016-07-17 DIAGNOSIS — E119 Type 2 diabetes mellitus without complications: Secondary | ICD-10-CM | POA: Diagnosis not present

## 2016-07-17 DIAGNOSIS — Y999 Unspecified external cause status: Secondary | ICD-10-CM | POA: Diagnosis not present

## 2016-07-17 DIAGNOSIS — S29012A Strain of muscle and tendon of back wall of thorax, initial encounter: Secondary | ICD-10-CM | POA: Insufficient documentation

## 2016-07-17 DIAGNOSIS — S46812A Strain of other muscles, fascia and tendons at shoulder and upper arm level, left arm, initial encounter: Secondary | ICD-10-CM

## 2016-07-17 DIAGNOSIS — R0789 Other chest pain: Secondary | ICD-10-CM | POA: Diagnosis present

## 2016-07-17 DIAGNOSIS — Z79899 Other long term (current) drug therapy: Secondary | ICD-10-CM | POA: Diagnosis not present

## 2016-07-17 DIAGNOSIS — Z853 Personal history of malignant neoplasm of breast: Secondary | ICD-10-CM | POA: Insufficient documentation

## 2016-07-17 LAB — I-STAT CHEM 8, ED
BUN: 28 mg/dL — ABNORMAL HIGH (ref 6–20)
CHLORIDE: 106 mmol/L (ref 101–111)
CREATININE: 0.8 mg/dL (ref 0.44–1.00)
Calcium, Ion: 1.1 mmol/L — ABNORMAL LOW (ref 1.15–1.40)
GLUCOSE: 199 mg/dL — AB (ref 65–99)
HCT: 38 % (ref 36.0–46.0)
Hemoglobin: 12.9 g/dL (ref 12.0–15.0)
POTASSIUM: 4.4 mmol/L (ref 3.5–5.1)
Sodium: 140 mmol/L (ref 135–145)
TCO2: 25 mmol/L (ref 0–100)

## 2016-07-17 LAB — URINALYSIS, ROUTINE W REFLEX MICROSCOPIC
Bilirubin Urine: NEGATIVE
Glucose, UA: >=500 mg/dL — AB
Ketones, ur: NEGATIVE mg/dL
Nitrite: POSITIVE — AB
PROTEIN: 100 mg/dL — AB
Specific Gravity, Urine: 1.023 (ref 1.005–1.030)
pH: 5 (ref 5.0–8.0)

## 2016-07-17 LAB — I-STAT TROPONIN, ED: TROPONIN I, POC: 0.01 ng/mL (ref 0.00–0.08)

## 2016-07-17 MED ORDER — CEPHALEXIN 500 MG PO CAPS: 500.0000 mg | ORAL_CAPSULE | Freq: Four times a day (QID) | ORAL | 0 refills | Status: DC

## 2016-07-17 MED ORDER — ACETAMINOPHEN 500 MG PO TABS
1000.0000 mg | ORAL_TABLET | Freq: Once | ORAL | Status: AC
  Administered 2016-07-17: 1000 mg via ORAL
  Filled 2016-07-17: qty 2

## 2016-07-17 NOTE — Progress Notes (Signed)
Orthopedic Tech Progress Note Patient Details:  Andrea [name] 07-05-1950 [id number]  Ortho Devices Type of Ortho Device: Arm sling Ortho Device/Splint Location: lue Ortho Device/Splint Interventions: Ordered, Application, Adjustment   Andrea [name] 07/17/2016, 8:06 PM

## 2016-07-17 NOTE — ED Notes (Signed)
Patient transported to X-ray 

## 2016-07-17 NOTE — ED Triage Notes (Signed)
Per EMS: pt c/o mid sternal to left sided CP worse with inspiration x 2 days; IV 22g R FA

## 2016-07-17 NOTE — Discharge Instructions (Signed)
Take tylenol 1000mg(2 extra strength) four times a day.  ° °

## 2016-07-17 NOTE — ED Notes (Signed)
Attempted to draw labs from IV without success. Phlebotomy notified

## 2016-07-17 NOTE — ED Provider Notes (Signed)
[facility] DEPT Provider Note   CSN: [id number] Arrival date & time: 07/17/16  1858     History   Chief Complaint Chief Complaint  Patient presents with  . Chest Pain    HPI Andrea [name] is a 66 y.o. female.  66 yo F with a chief complaints of chest pain. Started yesterday when she was washing her windows at home. Worse with movement twisting palpation. Denies exertional symptoms. Denies shortness of breath. Denies hemoptysis unilateral leg swelling recent procedure recent travel. Denies prior PE or DVT. Denies prior MI. History of hyperlipidemia and diabetes.   The history is provided by the patient.  Chest Pain   This is a new problem. The current episode started yesterday. The problem occurs constantly. The problem has not changed since onset.The pain is associated with movement and raising an arm. The pain is at a severity of 7/10. The pain is moderate. The quality of the pain is described as sharp. The pain does not radiate. Duration of episode(s) is 1 day. Pertinent negatives include no dizziness, no fever, no headaches, no nausea, no palpitations, no shortness of breath and no vomiting. She has tried nothing for the symptoms. The treatment provided no relief.  Her past medical history is significant for diabetes and hypertension.  Pertinent negatives for past medical history include no hyperlipidemia.    Past Medical History:  Diagnosis Date  . Breast cancer ([place])   . Diabetes mellitus without complication ([place])   . Esophageal reflux   . Hypertension   . Hypothyroid   . Migraine   . Seizure [facility])     Patient Active Problem List   Diagnosis Date Noted  . UTI (urinary tract infection) 12/03/2013  . Fall 12/02/2013  . Hypertension   . Breast cancer ([place])   . Diabetes mellitus without complication ([place])   . Migraine   . Hypothyroid   . Epilepsy ([place])   . Esophageal reflux   . Multiple rib fractures 11/28/2013    Past Surgical History:  Procedure  Laterality Date  . ABDOMINAL HYSTERECTOMY    . APPENDECTOMY    . CHOLECYSTECTOMY    . EYE SURGERY  08/28/2015   Right eye  . KNEE SURGERY    . MASTECTOMY      OB History    No data available       Home Medications    Prior to Admission medications   Medication Sig Start Date End Date Taking? Authorizing Provider  ALPRAZolam Duanne Moron) 0.5 MG tablet Take 1 tablet (0.5 mg total) by mouth 4 (four) times daily. Patient taking differently: Take 0.5 mg by mouth 4 (four) times daily as needed for anxiety.  12/06/13  Yes Lisette Abu, PA-C  atenolol (TENORMIN) 50 MG tablet Take 50 mg by mouth 2 (two) times daily.    Yes [provider]  CRANBERRY PO Take 1 tablet by mouth daily.   Yes [provider]  divalproex (DEPAKOTE) 500 MG DR tablet Take 500 mg by mouth at bedtime.    Yes [provider]  insulin glargine (LANTUS) 100 UNIT/ML injection Inject 20-50 Units into the skin daily as needed (for high blood sugar if over 130).    Yes [provider]  latanoprost (XALATAN) 0.005 % ophthalmic solution Place 1 drop into both eyes as needed (Dry Eyes).   Yes [provider]  levothyroxine (SYNTHROID, LEVOTHROID) 200 MCG tablet Take 200 mcg by mouth at bedtime.  10/02/15  Yes [provider]  lisinopril (  PRINIVIL,ZESTRIL) 10 MG tablet Take 10 mg by mouth at bedtime.    Yes [provider]  LYRICA 75 MG capsule Take 75 mg by mouth 2 (two) times daily. 10/13/15  Yes [provider]  miconazole (MICOTIN) 100 MG vaginal suppository Place 100 mg vaginally as needed (Yeast infection).   Yes [provider]  miconazole (MONISTAT 7) 2 % vaginal cream Place 1 Applicatorful vaginally as needed (yeast infection).   Yes [provider]  Morphine-Naltrexone (EMBEDA) 30-1.2 MG CPCR Take 1 tablet by mouth daily.   Yes [provider]  oxyCODONE-acetaminophen (PERCOCET) 10-325 MG tablet Take 1 tablet by mouth every  6 (six) hours as needed for pain. 09/25/15  Yes Harris, Abigail, PA-C  promethazine (PHENERGAN) 25 MG tablet Take 25 mg by mouth every 6 (six) hours as needed for nausea. 06/28/16  Yes [provider]  cephALEXin (KEFLEX) 500 MG capsule Take 1 capsule (500 mg total) by mouth 4 (four) times daily. 07/17/16   Deno Etienne, DO  clindamycin (CLEOCIN) 150 MG capsule Take 2 capsules (300 mg total) by mouth 3 (three) times daily. Patient not taking: Reported on 11/30/2015 09/25/15   Margarita Mail, PA-C  nitrofurantoin, macrocrystal-monohydrate, (MACROBID) 100 MG capsule Take 1 capsule (100 mg total) by mouth 2 (two) times daily. Patient not taking: Reported on 07/17/2016 11/30/15   Frederica Kuster, PA-C  omeprazole (PRILOSEC) 20 MG capsule Take 1 capsule (20 mg total) by mouth daily. Patient not taking: Reported on 07/17/2016 11/30/15   Frederica Kuster, PA-C  ondansetron (ZOFRAN ODT) 8 MG disintegrating tablet Take 1 tablet (8 mg total) by mouth every 8 (eight) hours as needed for nausea or vomiting. Patient not taking: Reported on 11/30/2015 11/23/15   Jeannett Senior, PA-C  promethazine (PHENERGAN) 25 MG suppository Place 1 suppository (25 mg total) rectally every 6 (six) hours as needed for nausea or vomiting. Patient not taking: Reported on 07/17/2016 11/30/15   Frederica Kuster, PA-C  sucralfate (CARAFATE) 1 GM/10ML suspension Take 10 mLs (1 g total) by mouth 4 (four) times daily -  with meals and at bedtime. Patient not taking: Reported on 07/17/2016 11/30/15   Frederica Kuster, PA-C  topiramate (TOPAMAX) 100 MG tablet Take 200 mg by mouth at bedtime.    [provider]  topiramate (TOPAMAX) 50 MG tablet Take 50 mg by mouth 3 (three) times daily.    [provider]    Family History History reviewed. No pertinent family history.  Social History Social History  Substance Use Topics  . Smoking status: Never Smoker  . Smokeless tobacco: Never Used  . Alcohol use No      Allergies   Aspirin; Maxzide [hydrochlorothiazide w-triamterene]; Metformin and related; Nsaids; Other; Pravachol [pravastatin]; Stadol [butorphanol]; Toradol [ketorolac tromethamine]; Vistaril [hydroxyzine hcl]; Erythromycin; Morphine and related; and Penicillins   Review of Systems Review of Systems  Constitutional: Negative for chills and fever.  HENT: Negative for congestion and rhinorrhea.   Eyes: Negative for redness and visual disturbance.  Respiratory: Negative for shortness of breath and wheezing.   Cardiovascular: Negative for chest pain and palpitations.  Gastrointestinal: Negative for nausea and vomiting.  Genitourinary: Positive for dysuria and frequency. Negative for urgency.  Musculoskeletal: Negative for arthralgias and myalgias.  Skin: Negative for pallor and wound.  Neurological: Negative for dizziness and headaches.     Physical Exam Updated Vital Signs BP (!) 202/87   Pulse 72   Temp 98.4 F (36.9 C) (Oral)   Resp  13   SpO2 96%   Physical Exam  Constitutional: She is oriented to person, place, and time. She appears well-developed and well-nourished. No distress.  HENT:  Head: Normocephalic and atraumatic.  Eyes: EOM are normal. Pupils are equal, round, and reactive to light.  Neck: Normal range of motion. Neck supple.  Cardiovascular: Normal rate and regular rhythm.  Exam reveals no gallop and no friction rub.   No murmur heard. Pulmonary/Chest: Effort normal. She has no wheezes. She has no rales.  Abdominal: Soft. She exhibits no distension. There is no tenderness.  Musculoskeletal: She exhibits tenderness. She exhibits no edema.  Symptoms reproduced with palpation of the trapezius muscle belly.  Neurological: She is alert and oriented to person, place, and time.  Skin: Skin is warm and dry. She is not diaphoretic.  Psychiatric: She has a normal mood and affect. Her behavior is normal.  Nursing note and vitals reviewed.    ED Treatments /  Results  Labs (all labs ordered are listed, but only abnormal results are displayed) Labs Reviewed  URINALYSIS, ROUTINE W REFLEX MICROSCOPIC - Abnormal; Notable for the following:       Result Value   Color, Urine AMBER (*)    APPearance CLOUDY (*)    Glucose, UA >=500 (*)    Hgb urine dipstick SMALL (*)    Protein, ur 100 (*)    Nitrite POSITIVE (*)    Leukocytes, UA LARGE (*)    Bacteria, UA MANY (*)    Squamous Epithelial / LPF 0-5 (*)    All other components within normal limits  I-STAT CHEM 8, ED - Abnormal; Notable for the following:    BUN 28 (*)    Glucose, Bld 199 (*)    Calcium, Ion 1.10 (*)    All other components within normal limits  I-STAT TROPOININ, ED    EKG  EKG Interpretation  Date/Time:  Wednesday July 17 2016 19:06:03 EDT Ventricular Rate:  74 PR Interval:    QRS Duration: 81 QT Interval:  386 QTC Calculation: 429 R Axis:   43 Text Interpretation:  Sinus rhythm No significant change since last tracing Confirmed by Deno Etienne [phone]) on 07/17/2016 7:13:52 PM       Radiology Dg Chest 2 View  Result Date: 07/17/2016 CLINICAL DATA:  Left-sided chest pain beginning yesterday. History of diabetes and breast cancer. EXAM: CHEST  2 VIEW COMPARISON:  11/30/2015; Chest CT - 11/30/2015 FINDINGS: Grossly unchanged cardiac silhouette and mediastinal contours, with slight differences likely attributable to AP projection slightly decreased lung volumes. There is minimal thickening the right paratracheal stripe, presumably secondary to prominent vasculature. No focal parenchymal opacities. No pleural effusion or pneumothorax. No evidence of edema. No acute osseus abnormalities. IMPRESSION: No acute cardiopulmonary disease. Electronically Signed   By: Sandi Mariscal M.D.   On: 07/17/2016 20:55    Procedures Procedures (including critical care time)  Medications Ordered in ED Medications  acetaminophen (TYLENOL) tablet 1,000 mg (1,000 mg Oral Given 07/17/16 2008)      Initial Impression / Assessment and Plan / ED Course  I have reviewed the triage vital signs and the nursing notes.  Pertinent labs & imaging results that were available during my care of the patient were reviewed by me and considered in my medical decision making (see chart for details).     66 yo F With a chief complaint of left-sided chest pain. Reproduced on exam with palpation. Most likely musculoskeletal. With her age and risk factors will  obtain a troponin. This pain started yesterday and has not changed in character and do not feel that a delta is warranted. EKG without significant changes. The patient was also complaining of urinary symptoms. UA with UTI. Will treat.  11:18 PM:  I have discussed the diagnosis/risks/treatment options with the patient and family and believe the pt to be eligible for discharge home to follow-up with PCP. We also discussed returning to the ED immediately if new or worsening sx occur. We discussed the sx which are most concerning (e.g., sudden worsening pain, fever, inability to tolerate by mouth) that necessitate immediate return. Medications administered to the patient during their visit and any new prescriptions provided to the patient are listed below.  Medications given during this visit Medications  acetaminophen (TYLENOL) tablet 1,000 mg (1,000 mg Oral Given 07/17/16 2008)     The patient appears reasonably screen and/or stabilized for discharge and I doubt any other medical condition or other [facility] requiring further screening, evaluation, or treatment in the ED at this time prior to discharge.    Final Clinical Impressions(s) / ED Diagnoses   Final diagnoses:  Strain of left trapezius muscle, initial encounter    New Prescriptions New Prescriptions   CEPHALEXIN (KEFLEX) 500 MG CAPSULE    Take 1 capsule (500 mg total) by mouth 4 (four) times daily.     Deno Etienne, DO 07/17/16 2318

## 2016-09-12 ENCOUNTER — Emergency Department (HOSPITAL_COMMUNITY)
Admission: EM | Admit: 2016-09-12 | Discharge: 2016-09-12 | Disposition: A | Discharge status: Home / Self Care | Payer: Medicare Other | Attending: Emergency Medicine | Admitting: Emergency Medicine

## 2016-09-12 ENCOUNTER — Emergency Department (HOSPITAL_COMMUNITY): Payer: Medicare Other

## 2016-09-12 ENCOUNTER — Encounter (HOSPITAL_COMMUNITY): Payer: Self-pay | Admitting: Emergency Medicine

## 2016-09-12 DIAGNOSIS — Y929 Unspecified place or not applicable: Secondary | ICD-10-CM | POA: Insufficient documentation

## 2016-09-12 DIAGNOSIS — E039 Hypothyroidism, unspecified: Secondary | ICD-10-CM | POA: Insufficient documentation

## 2016-09-12 DIAGNOSIS — W19XXXA Unspecified fall, initial encounter: Secondary | ICD-10-CM

## 2016-09-12 DIAGNOSIS — Z79899 Other long term (current) drug therapy: Secondary | ICD-10-CM | POA: Diagnosis not present

## 2016-09-12 DIAGNOSIS — Y999 Unspecified external cause status: Secondary | ICD-10-CM | POA: Diagnosis not present

## 2016-09-12 DIAGNOSIS — W109XXA Fall (on) (from) unspecified stairs and steps, initial encounter: Secondary | ICD-10-CM | POA: Insufficient documentation

## 2016-09-12 DIAGNOSIS — I10 Essential (primary) hypertension: Secondary | ICD-10-CM | POA: Insufficient documentation

## 2016-09-12 DIAGNOSIS — S3992XA Unspecified injury of lower back, initial encounter: Secondary | ICD-10-CM | POA: Diagnosis present

## 2016-09-12 DIAGNOSIS — L03115 Cellulitis of right lower limb: Secondary | ICD-10-CM | POA: Insufficient documentation

## 2016-09-12 DIAGNOSIS — E119 Type 2 diabetes mellitus without complications: Secondary | ICD-10-CM | POA: Diagnosis not present

## 2016-09-12 DIAGNOSIS — Y9301 Activity, walking, marching and hiking: Secondary | ICD-10-CM | POA: Insufficient documentation

## 2016-09-12 LAB — CBG MONITORING, ED: Glucose-Capillary: 249 mg/dL — ABNORMAL HIGH (ref 65–99)

## 2016-09-12 MED ORDER — ACETAMINOPHEN 500 MG PO TABS
1000.0000 mg | ORAL_TABLET | Freq: Once | ORAL | Status: AC
  Administered 2016-09-12: 1000 mg via ORAL
  Filled 2016-09-12: qty 2

## 2016-09-12 MED ORDER — TRAMADOL HCL 50 MG PO TABS
50.0000 mg | ORAL_TABLET | Freq: Once | ORAL | Status: AC
  Administered 2016-09-12: 50 mg via ORAL
  Filled 2016-09-12: qty 1

## 2016-09-12 MED ORDER — DOXYCYCLINE HYCLATE 100 MG PO CAPS: 100.0000 mg | ORAL_CAPSULE | Freq: Two times a day (BID) | ORAL | 0 refills | Status: DC

## 2016-09-12 MED ORDER — TRAMADOL HCL 50 MG PO TABS: 25.0000 mg | ORAL_TABLET | Freq: Four times a day (QID) | ORAL | 0 refills | Status: DC | PRN

## 2016-09-12 MED ORDER — DOXYCYCLINE HYCLATE 100 MG PO TABS
100.0000 mg | ORAL_TABLET | Freq: Once | ORAL | Status: AC
  Administered 2016-09-12: 100 mg via ORAL
  Filled 2016-09-12: qty 1

## 2016-09-12 NOTE — ED Provider Notes (Signed)
[place] DEPT Provider Note   CSN: [id number] Arrival date & time: 09/12/16  1256     History   Chief Complaint Chief Complaint  Patient presents with  . Back Pain    HPI Andrea [name] is a 66 y.o. female who presents emergency Department with chief complaint of fall. The patient states that she's had been having some pain in her right foot secondary to a blister that occurred. Yesterday it was painful. However, today it is more painful with swelling and redness streaking up from the wound. Patient states that she lost her footing because her foot was so sore. She fell directly onto her tailbone and fell down 2 steps. She denies hitting her head or losing consciousness. She denies fevers or chills. She has been able to ambulate but states that she is extremely sore in her tailbone region. She denies numbness, tingling, incontinence.  HPI  Past Medical History:  Diagnosis Date  . Breast cancer ([place])   . Diabetes mellitus without complication ([place])   . Esophageal reflux   . Hypertension   . Hypothyroid   . Migraine   . Seizure [facility])     Patient Active Problem List   Diagnosis Date Noted  . UTI (urinary tract infection) 12/03/2013  . Fall 12/02/2013  . Hypertension   . Breast cancer ([place])   . Diabetes mellitus without complication ([place])   . Migraine   . Hypothyroid   . Epilepsy ([employer])   . Esophageal reflux   . Multiple rib fractures 11/28/2013    Past Surgical History:  Procedure Laterality Date  . ABDOMINAL HYSTERECTOMY    . APPENDECTOMY    . CHOLECYSTECTOMY    . EYE SURGERY  08/28/2015   Right eye  . KNEE SURGERY    . MASTECTOMY      OB History    No data available       Home Medications    Prior to Admission medications   Medication Sig Start Date End Date Taking? Authorizing Provider  ALPRAZolam Duanne Moron) 0.5 MG tablet Take 1 tablet (0.5 mg total) by mouth 4 (four) times daily. Patient taking differently: Take 0.5 mg by mouth 4 (four)  times daily as needed for anxiety.  12/06/13   Lisette Abu, PA-C  atenolol (TENORMIN) 50 MG tablet Take 50 mg by mouth 2 (two) times daily.     [provider]  cephALEXin (KEFLEX) 500 MG capsule Take 1 capsule (500 mg total) by mouth 4 (four) times daily. 07/17/16   Deno Etienne, DO  clindamycin (CLEOCIN) 150 MG capsule Take 2 capsules (300 mg total) by mouth 3 (three) times daily. Patient not taking: Reported on 11/30/2015 09/25/15   Margarita Mail, PA-C  CRANBERRY PO Take 1 tablet by mouth daily.    [provider]  divalproex (DEPAKOTE) 500 MG DR tablet Take 500 mg by mouth at bedtime.     [provider]  insulin glargine (LANTUS) 100 UNIT/ML injection Inject 20-50 Units into the skin daily as needed (for high blood sugar if over 130).     [provider]  latanoprost (XALATAN) 0.005 % ophthalmic solution Place 1 drop into both eyes as needed (Dry Eyes).    [provider]  levothyroxine (SYNTHROID, LEVOTHROID) 200 MCG tablet Take 200 mcg by mouth at bedtime.  10/02/15   [provider]  lisinopril (PRINIVIL,ZESTRIL) 10 MG tablet Take 10 mg by mouth at bedtime.     [provider]  LYRICA 75 MG capsule  Take 75 mg by mouth 2 (two) times daily. 10/13/15   [provider]  miconazole (MICOTIN) 100 MG vaginal suppository Place 100 mg vaginally as needed (Yeast infection).    [provider]  miconazole (MONISTAT 7) 2 % vaginal cream Place 1 Applicatorful vaginally as needed (yeast infection).    [provider]  Morphine-Naltrexone (EMBEDA) 30-1.2 MG CPCR Take 1 tablet by mouth daily.    [provider]  nitrofurantoin, macrocrystal-monohydrate, (MACROBID) 100 MG capsule Take 1 capsule (100 mg total) by mouth 2 (two) times daily. Patient not taking: Reported on 07/17/2016 11/30/15   Frederica Kuster, PA-C  omeprazole (PRILOSEC) 20 MG capsule Take 1 capsule (20 mg total) by mouth daily. Patient not  taking: Reported on 07/17/2016 11/30/15   Frederica Kuster, PA-C  ondansetron (ZOFRAN ODT) 8 MG disintegrating tablet Take 1 tablet (8 mg total) by mouth every 8 (eight) hours as needed for nausea or vomiting. Patient not taking: Reported on 11/30/2015 11/23/15   Jeannett Senior, PA-C  oxyCODONE-acetaminophen (PERCOCET) 10-325 MG tablet Take 1 tablet by mouth every 6 (six) hours as needed for pain. 09/25/15   Margarita Mail, PA-C  promethazine (PHENERGAN) 25 MG suppository Place 1 suppository (25 mg total) rectally every 6 (six) hours as needed for nausea or vomiting. Patient not taking: Reported on 07/17/2016 11/30/15   Frederica Kuster, PA-C  promethazine (PHENERGAN) 25 MG tablet Take 25 mg by mouth every 6 (six) hours as needed for nausea. 06/28/16   [provider]  sucralfate (CARAFATE) 1 GM/10ML suspension Take 10 mLs (1 g total) by mouth 4 (four) times daily -  with meals and at bedtime. Patient not taking: Reported on 07/17/2016 11/30/15   Frederica Kuster, PA-C  topiramate (TOPAMAX) 100 MG tablet Take 200 mg by mouth at bedtime.    [provider]  topiramate (TOPAMAX) 50 MG tablet Take 50 mg by mouth 3 (three) times daily.    [provider]    Family History No family history on file.  Social History Social History  Substance Use Topics  . Smoking status: Never Smoker  . Smokeless tobacco: Never Used  . Alcohol use No     Allergies   Aspirin; Maxzide [hydrochlorothiazide w-triamterene]; Metformin and related; Nsaids; Other; Pravachol [pravastatin]; Stadol [butorphanol]; Toradol [ketorolac tromethamine]; Vistaril [hydroxyzine hcl]; Erythromycin; Morphine and related; and Penicillins   Review of Systems Review of Systems  Ten systems reviewed and are negative for acute change, except as noted in the HPI.   Physical Exam Updated Vital Signs BP (!) 142/98 (BP Location: Right Arm)   Pulse 80   Temp 98 F (36.7 C) (Oral)   Resp 20   SpO2 99%    Physical Exam  Constitutional: She is oriented to person, place, and time. She appears well-developed and well-nourished. No distress.  HENT:  Head: Normocephalic and atraumatic.  Eyes: Conjunctivae are normal. No scleral icterus.  Neck: Normal range of motion.  Cardiovascular: Normal rate, regular rhythm and normal heart sounds.  Exam reveals no gallop and no friction rub.   No murmur heard. Pulmonary/Chest: Effort normal and breath sounds normal. No respiratory distress.  Abdominal: Soft. Bowel sounds are normal. She exhibits no distension and no mass. There is no tenderness. There is no guarding.  Musculoskeletal:       Lumbar back: She exhibits bony tenderness.       Back:  Feet:  Right Foot:  Skin Integrity: Positive for ulcer, erythema and warmth.  Neurological: She is alert and oriented to person, place, and time.  Skin: Skin is warm and dry. She is not diaphoretic.  Psychiatric: Her behavior is normal.  Nursing note and vitals reviewed.    ED Treatments / Results  Labs (all labs ordered are listed, but only abnormal results are displayed) Labs Reviewed  CBG MONITORING, ED - Abnormal; Notable for the following:       Result Value   Glucose-Capillary 249 (*)    All other components within normal limits    EKG  EKG Interpretation None       Radiology Dg Lumbar Spine Complete  Result Date: 09/12/2016 CLINICAL DATA:  Low back pain after fall down stairs at home. EXAM: LUMBAR SPINE - COMPLETE 4+ VIEW COMPARISON:  Radiographs of May 06, 2016. FINDINGS: Stable mild wedge compression deformity of L3 vertebral body is noted consistent with old fracture. No acute fracture or spondylolisthesis is noted. Mild diffuse osteopenia is noted. Disc spaces are well-maintained. IMPRESSION: No acute abnormality seen in the lumbar spine. Electronically Signed   By: Marijo Conception, M.D.   On: 09/12/2016 19:34   Dg Sacrum/coccyx  Result Date: 09/12/2016 CLINICAL DATA:  Patient  fell at home landing on bilateral. EXAM: SACRUM AND COCCYX - 2+ VIEW COMPARISON:  CT 11/23/2015 FINDINGS: There is no evidence of fracture or other focal bone lesions. The arcuate lines of the sacrum appear intact. The SI joints and pubic symphysis demonstrate no diastasis. The pubic rami appear intact. Included femoral heads are seated within their acetabular components. Small phleboliths are seen in the pelvis bilaterally. IMPRESSION: Negative for acute fracture or malalignment of the sacrum and coccyx. Electronically Signed   By: Ashley Royalty M.D.   On: 09/12/2016 19:34    Procedures Procedures (including critical care time)  Medications Ordered in ED Medications  acetaminophen (TYLENOL) tablet 1,000 mg (1,000 mg Oral Given 09/12/16 1934)     Initial Impression / Assessment and Plan / ED Course  I have reviewed the triage vital signs and the nursing notes.  Pertinent labs & imaging results that were available during my care of the patient were reviewed by me and considered in my medical decision making (see chart for details).     I reviewed the images. No evidence of fracture. Patient is ambulatory. She has a small area of cellulitis secondary to ulceration of the right medial foot. The area will be demarcated. Patient given first dose of doxycycline, tramadol. She appears safe for discharge. Discussed return precautions. Patient is ambulatory  Final Clinical Impressions(s) / ED Diagnoses   Final diagnoses:  Cellulitis of foot, right  Tailbone injury, initial encounter  Fall, initial encounter    New Prescriptions New Prescriptions   No medications on file     Ned Grace 09/12/16 2047    Daleen Bo, MD 09/13/16 1603    Daleen Bo, MD 09/13/16 [phone]

## 2016-09-12 NOTE — ED Provider Notes (Signed)
  Face-to-face evaluation   History: She presents for evaluation of lower back pain.  She injured her back earlier today when she slipped on some steps landing on her buttocks.  She feels like she slipped because of a blister on her right foot.  Since falling she has increased pain in her back and feels like she needs some oxycodone for it.  She is also concerned that the blister on her foot, looks a little different today with more redness.  Physical exam: Alert, cooperative.  Back with mild lumbar tenderness, negative straight leg raising bilaterally.  Right foot with wound on medial heel, characterized by the appearance of a blister which has been denuded with mild surrounding erythema, but no fluctuance, drainage or bleeding.  Medical screening examination/treatment/procedure(s) were conducted as a shared visit with non-physician practitioner(s) and myself.  I personally evaluated the patient during the encounter    Daleen Bo, MD 09/13/16 [phone]

## 2016-09-12 NOTE — Discharge Instructions (Signed)
Get help right away if: Your symptoms get worse. You feel very sleepy. You develop vomiting or diarrhea that persists. You notice red streaks coming from the infected area. Your red area gets larger or turns dark in color. 

## 2016-09-12 NOTE — ED Triage Notes (Signed)
Per EMS, states she fell walking down steps-complianing of lower back pain-states CBG 423, asymptomatic-states she has not eaten nor taken her insulin today

## 2016-10-25 ENCOUNTER — Emergency Department (HOSPITAL_COMMUNITY)
Admission: EM | Admit: 2016-10-25 | Discharge: 2016-10-25 | Disposition: A | Discharge status: Home / Self Care | Payer: Medicare Other | Attending: Emergency Medicine | Admitting: Emergency Medicine

## 2016-10-25 ENCOUNTER — Encounter (HOSPITAL_COMMUNITY): Payer: Self-pay

## 2016-10-25 DIAGNOSIS — I10 Essential (primary) hypertension: Secondary | ICD-10-CM | POA: Insufficient documentation

## 2016-10-25 DIAGNOSIS — M5431 Sciatica, right side: Secondary | ICD-10-CM | POA: Diagnosis not present

## 2016-10-25 DIAGNOSIS — Z79899 Other long term (current) drug therapy: Secondary | ICD-10-CM | POA: Insufficient documentation

## 2016-10-25 DIAGNOSIS — Z794 Long term (current) use of insulin: Secondary | ICD-10-CM | POA: Diagnosis not present

## 2016-10-25 DIAGNOSIS — E039 Hypothyroidism, unspecified: Secondary | ICD-10-CM | POA: Insufficient documentation

## 2016-10-25 DIAGNOSIS — M545 Low back pain: Secondary | ICD-10-CM | POA: Diagnosis present

## 2016-10-25 DIAGNOSIS — E119 Type 2 diabetes mellitus without complications: Secondary | ICD-10-CM | POA: Diagnosis not present

## 2016-10-25 LAB — CBC WITH DIFFERENTIAL/PLATELET
BASOS ABS: 0 10*3/uL (ref 0.0–0.1)
BASOS PCT: 0 %
EOS PCT: 2 %
Eosinophils Absolute: 0.1 10*3/uL (ref 0.0–0.7)
HCT: 38.1 % (ref 36.0–46.0)
Hemoglobin: 12.6 g/dL (ref 12.0–15.0)
Lymphocytes Relative: 56 %
Lymphs Abs: 3.4 10*3/uL (ref 0.7–4.0)
MCH: 29.2 pg (ref 26.0–34.0)
MCHC: 33.1 g/dL (ref 30.0–36.0)
MCV: 88.2 fL (ref 78.0–100.0)
MONO ABS: 0.4 10*3/uL (ref 0.1–1.0)
Monocytes Relative: 6 %
NEUTROS ABS: 2.3 10*3/uL (ref 1.7–7.7)
Neutrophils Relative %: 36 %
PLATELETS: 210 10*3/uL (ref 150–400)
RBC: 4.32 MIL/uL (ref 3.87–5.11)
RDW: 13 % (ref 11.5–15.5)
WBC: 6.2 10*3/uL (ref 4.0–10.5)

## 2016-10-25 LAB — BASIC METABOLIC PANEL
ANION GAP: 6 (ref 5–15)
BUN: 15 mg/dL (ref 6–20)
CALCIUM: 8.9 mg/dL (ref 8.9–10.3)
CO2: 26 mmol/L (ref 22–32)
Chloride: 106 mmol/L (ref 101–111)
Creatinine, Ser: 0.85 mg/dL (ref 0.44–1.00)
GFR calc Af Amer: >60 mL/min (ref >60)
Glucose, Bld: 270 mg/dL — ABNORMAL HIGH (ref 65–99)
Potassium: 4.2 mmol/L (ref 3.5–5.1)
SODIUM: 138 mmol/L (ref 135–145)

## 2016-10-25 LAB — URINALYSIS, ROUTINE W REFLEX MICROSCOPIC
BACTERIA UA: NONE SEEN
Bilirubin Urine: NEGATIVE
Glucose, UA: >=500 mg/dL — AB
Hgb urine dipstick: NEGATIVE
Ketones, ur: NEGATIVE mg/dL
Leukocytes, UA: NEGATIVE
Nitrite: NEGATIVE
PH: 5 (ref 5.0–8.0)
Protein, ur: 100 mg/dL — AB
SPECIFIC GRAVITY, URINE: 1.038 — AB (ref 1.005–1.030)

## 2016-10-25 LAB — SEDIMENTATION RATE: SED RATE: 20 mm/h (ref 0–22)

## 2016-10-25 MED ORDER — HYDROMORPHONE HCL 1 MG/ML IJ SOLN
2.0000 mg | Freq: Once | INTRAMUSCULAR | Status: DC
  Filled 2016-10-25: qty 2

## 2016-10-25 MED ORDER — HYDROMORPHONE HCL 1 MG/ML IJ SOLN
2.0000 mg | Freq: Once | INTRAMUSCULAR | Status: AC
  Administered 2016-10-25: 2 mg via INTRAMUSCULAR

## 2016-10-25 NOTE — ED Provider Notes (Signed)
[facility] DEPT Provider Note   CSN: [id number] Arrival date & time: 10/25/16  6767     History   Chief Complaint Chief Complaint  Patient presents with  . Back Pain    HPI Andrea [name] is a 66 y.o. female.  She complains of worsening back pain following an epidural injection, with steroids, 2 days ago.  The pain is in her right lower back and radiates to her right buttock and right leg.  This pain was present prior to the injection as well.  She denies bowel or bladder incontinence.  She denies fever, chills, nausea or vomiting.  She is taking her usual medications, without relief.  Her last back injection was about a year ago when she had it once.  There are no other known modifying factors.  HPI  Past Medical History:  Diagnosis Date  . Breast cancer ([place])   . Diabetes mellitus without complication ([place])   . Esophageal reflux   . Hypertension   . Hypothyroid   . Migraine   . Seizure [facility])     Patient Active Problem List   Diagnosis Date Noted  . UTI (urinary tract infection) 12/03/2013  . Fall 12/02/2013  . Hypertension   . Breast cancer ([place])   . Diabetes mellitus without complication ([place])   . Migraine   . Hypothyroid   . Epilepsy ([place])   . Esophageal reflux   . Multiple rib fractures 11/28/2013    Past Surgical History:  Procedure Laterality Date  . ABDOMINAL HYSTERECTOMY    . APPENDECTOMY    . CHOLECYSTECTOMY    . EYE SURGERY  08/28/2015   Right eye  . KNEE SURGERY    . MASTECTOMY      OB History    No data available       Home Medications    Prior to Admission medications   Medication Sig Start Date End Date Taking? Authorizing Provider  ALPRAZolam Duanne Moron) 0.5 MG tablet Take 1 tablet (0.5 mg total) by mouth 4 (four) times daily. Patient taking differently: Take 0.5 mg by mouth 4 (four) times daily as needed for anxiety.  12/06/13   Lisette Abu, PA-C  atenolol (TENORMIN) 50 MG tablet Take 50 mg by mouth 2 (two) times  daily.     [provider]  cephALEXin (KEFLEX) 500 MG capsule Take 1 capsule (500 mg total) by mouth 4 (four) times daily. 07/17/16   Deno Etienne, DO  clindamycin (CLEOCIN) 150 MG capsule Take 2 capsules (300 mg total) by mouth 3 (three) times daily. Patient not taking: Reported on 11/30/2015 09/25/15   Margarita Mail, PA-C  CRANBERRY PO Take 1 tablet by mouth daily.    [provider]  divalproex (DEPAKOTE) 500 MG DR tablet Take 500 mg by mouth at bedtime.     [provider]  doxycycline (VIBRAMYCIN) 100 MG capsule Take 1 capsule (100 mg total) by mouth 2 (two) times daily. 09/12/16   Margarita Mail, PA-C  insulin glargine (LANTUS) 100 UNIT/ML injection Inject 20-50 Units into the skin daily as needed (for high blood sugar if over 130).     [provider]  latanoprost (XALATAN) 0.005 % ophthalmic solution Place 1 drop into both eyes as needed ([place]ry Eyes).    [provider]  levothyroxine (SYNTHROID, LEVOTHROID) 200 MCG tablet Take 200 mcg by mouth at bedtime.  10/02/15   [provider]  lisinopril (PRINIVIL,ZESTRIL) 10 MG tablet Take 10 mg by mouth at bedtime.  [provider]  LYRICA 75 MG capsule Take 75 mg by mouth 2 (two) times daily. 10/13/15   [provider]  miconazole (MICOTIN) 100 MG vaginal suppository Place 100 mg vaginally as needed (Yeast infection).    [provider]  miconazole (MONISTAT 7) 2 % vaginal cream Place 1 Applicatorful vaginally as needed (yeast infection).    [provider]  Morphine-Naltrexone (EMBEDA) 30-1.2 MG CPCR Take 1 tablet by mouth daily.    [provider]  nitrofurantoin, macrocrystal-monohydrate, (MACROBID) 100 MG capsule Take 1 capsule (100 mg total) by mouth 2 (two) times daily. Patient not taking: Reported on 07/17/2016 11/30/15   Frederica Kuster, PA-C  omeprazole (PRILOSEC) 20 MG capsule Take 1 capsule (20 mg total) by mouth daily. Patient not taking:  Reported on 07/17/2016 11/30/15   Frederica Kuster, PA-C  ondansetron (ZOFRAN ODT) 8 MG disintegrating tablet Take 1 tablet (8 mg total) by mouth every 8 (eight) hours as needed for nausea or vomiting. Patient not taking: Reported on 11/30/2015 11/23/15   Jeannett Senior, PA-C  oxyCODONE-acetaminophen (PERCOCET) 10-325 MG tablet Take 1 tablet by mouth every 6 (six) hours as needed for pain. 09/25/15   Margarita Mail, PA-C  promethazine (PHENERGAN) 25 MG suppository Place 1 suppository (25 mg total) rectally every 6 (six) hours as needed for nausea or vomiting. Patient not taking: Reported on 07/17/2016 11/30/15   Frederica Kuster, PA-C  promethazine (PHENERGAN) 25 MG tablet Take 25 mg by mouth every 6 (six) hours as needed for nausea. 06/28/16   [provider]  sucralfate (CARAFATE) 1 GM/10ML suspension Take 10 mLs (1 g total) by mouth 4 (four) times daily -  with meals and at bedtime. Patient not taking: Reported on 07/17/2016 11/30/15   Frederica Kuster, PA-C  topiramate (TOPAMAX) 100 MG tablet Take 200 mg by mouth at bedtime.    [provider]  topiramate (TOPAMAX) 50 MG tablet Take 50 mg by mouth 3 (three) times daily.    [provider]  traMADol (ULTRAM) 50 MG tablet Take 0.5-1 tablets (25-50 mg total) by mouth every 6 (six) hours as needed. 09/12/16   Margarita Mail, PA-C    Family History No family history on file.  Social History Social History  Substance Use Topics  . Smoking status: Never Smoker  . Smokeless tobacco: Never Used  . Alcohol use No     Allergies   Aspirin; Maxzide [hydrochlorothiazide w-triamterene]; Metformin and related; Nsaids; Other; Pravachol [pravastatin]; Stadol [butorphanol]; Toradol [ketorolac tromethamine]; Vistaril [hydroxyzine hcl]; Erythromycin; Morphine and related; and Penicillins   Review of Systems Review of Systems  All other systems reviewed and are negative.    Physical Exam Updated Vital Signs BP (!) 196/96 (BP  Location: Right Arm)   Pulse 85   Temp 98.7 F (37.1 C) (Oral)   Resp 16   SpO2 98%   Physical Exam  Constitutional: She is oriented to person, place, and time. She appears well-developed and well-nourished. No distress.  HENT:  Head: Normocephalic and atraumatic.  Eyes: Pupils are equal, round, and reactive to light. Conjunctivae and EOM are normal.  Neck: Normal range of motion and phonation normal. Neck supple.  Cardiovascular: Normal rate and regular rhythm.   Pulmonary/Chest: Effort normal and breath sounds normal. She exhibits no tenderness.  Abdominal: Soft. She exhibits no distension. There is no tenderness. There is no guarding.  Musculoskeletal: Normal range of motion.  Tender right lumbar and right buttock.  Fair range of motion  back.  Neurological: She is alert and oriented to person, place, and time. She exhibits normal muscle tone.  Skin: Skin is warm and dry.  Psychiatric: She has a normal mood and affect. Her behavior is normal. Judgment and thought content normal.  Nursing note and vitals reviewed.    ED Treatments / Results  Labs (all labs ordered are listed, but only abnormal results are displayed) Labs Reviewed  BASIC METABOLIC PANEL  CBC WITH DIFFERENTIAL/PLATELET  SEDIMENTATION RATE    EKG  EKG Interpretation None       Radiology No results found.  Procedures Procedures (including critical care time)  Medications Ordered in ED Medications - No data to display   Initial Impression / Assessment and Plan / ED Course  I have reviewed the triage vital signs and the nursing notes.  Pertinent labs & imaging results that were available during my care of the patient were reviewed by me and considered in my medical decision making (see chart for details).      Final Clinical Impressions(s) / ED Diagnoses   Final diagnoses:  Sciatica of right side    Evaluation consistent with recurrent symptoms from sciatica.  Doubt fracture, lumbar  myelopathy or metabolic instability.  Nursing Notes Reviewed/ Care Coordinated Applicable Imaging Reviewed Interpretation of Laboratory Data incorporated into ED treatment  The patient appears reasonably screened and/or stabilized for discharge and I doubt any other medical condition or other [facility] requiring further screening, evaluation, or treatment in the ED at this time prior to discharge.  Plan: Home Medications-continue usual medicines, use OTC analgesia; Home Treatments-rest, heat; return here if the recommended treatment, does not improve the symptoms; Recommended follow up-PCP checkup 1 week.   New Prescriptions New Prescriptions   No medications on file     Daleen Bo, MD 10/28/16 1313

## 2016-10-25 NOTE — ED Triage Notes (Signed)
Pt comes via [facility] for post op pain, pt had a procedure on back on Wednedsay, pain has got worse .

## 2016-10-25 NOTE — Discharge Instructions (Signed)
Use heat on the sore area 3 or 4 times a day.  See your doctor as needed for problems.

## 2016-11-22 ENCOUNTER — Observation Stay (HOSPITAL_COMMUNITY)
Admission: EM | Admit: 2016-11-22 | Discharge: 2016-11-24 | Disposition: A | Discharge status: Home / Self Care | Payer: Medicare Other | Attending: Internal Medicine | Admitting: Internal Medicine

## 2016-11-22 ENCOUNTER — Emergency Department (HOSPITAL_COMMUNITY): Payer: Medicare Other

## 2016-11-22 ENCOUNTER — Encounter (HOSPITAL_COMMUNITY): Payer: Self-pay | Admitting: Emergency Medicine

## 2016-11-22 DIAGNOSIS — Z794 Long term (current) use of insulin: Secondary | ICD-10-CM | POA: Insufficient documentation

## 2016-11-22 DIAGNOSIS — Z886 Allergy status to analgesic agent status: Secondary | ICD-10-CM | POA: Insufficient documentation

## 2016-11-22 DIAGNOSIS — Z90710 Acquired absence of both cervix and uterus: Secondary | ICD-10-CM | POA: Insufficient documentation

## 2016-11-22 DIAGNOSIS — Z9049 Acquired absence of other specified parts of digestive tract: Secondary | ICD-10-CM | POA: Insufficient documentation

## 2016-11-22 DIAGNOSIS — Z91013 Allergy to seafood: Secondary | ICD-10-CM | POA: Insufficient documentation

## 2016-11-22 DIAGNOSIS — G8929 Other chronic pain: Secondary | ICD-10-CM | POA: Insufficient documentation

## 2016-11-22 DIAGNOSIS — Z79899 Other long term (current) drug therapy: Secondary | ICD-10-CM | POA: Insufficient documentation

## 2016-11-22 DIAGNOSIS — Z880 Allergy status to penicillin: Secondary | ICD-10-CM | POA: Diagnosis not present

## 2016-11-22 DIAGNOSIS — F419 Anxiety disorder, unspecified: Secondary | ICD-10-CM | POA: Diagnosis not present

## 2016-11-22 DIAGNOSIS — Z9013 Acquired absence of bilateral breasts and nipples: Secondary | ICD-10-CM | POA: Insufficient documentation

## 2016-11-22 DIAGNOSIS — Z881 Allergy status to other antibiotic agents status: Secondary | ICD-10-CM | POA: Diagnosis not present

## 2016-11-22 DIAGNOSIS — K219 Gastro-esophageal reflux disease without esophagitis: Secondary | ICD-10-CM | POA: Diagnosis not present

## 2016-11-22 DIAGNOSIS — Z853 Personal history of malignant neoplasm of breast: Secondary | ICD-10-CM | POA: Diagnosis not present

## 2016-11-22 DIAGNOSIS — G40909 Epilepsy, unspecified, not intractable, without status epilepticus: Secondary | ICD-10-CM | POA: Insufficient documentation

## 2016-11-22 DIAGNOSIS — N281 Cyst of kidney, acquired: Secondary | ICD-10-CM | POA: Insufficient documentation

## 2016-11-22 DIAGNOSIS — M549 Dorsalgia, unspecified: Secondary | ICD-10-CM | POA: Insufficient documentation

## 2016-11-22 DIAGNOSIS — Z885 Allergy status to narcotic agent status: Secondary | ICD-10-CM | POA: Diagnosis not present

## 2016-11-22 DIAGNOSIS — E1165 Type 2 diabetes mellitus with hyperglycemia: Secondary | ICD-10-CM | POA: Diagnosis not present

## 2016-11-22 DIAGNOSIS — I10 Essential (primary) hypertension: Secondary | ICD-10-CM | POA: Insufficient documentation

## 2016-11-22 DIAGNOSIS — D72829 Elevated white blood cell count, unspecified: Secondary | ICD-10-CM | POA: Diagnosis not present

## 2016-11-22 DIAGNOSIS — R112 Nausea with vomiting, unspecified: Secondary | ICD-10-CM | POA: Diagnosis present

## 2016-11-22 DIAGNOSIS — Z888 Allergy status to other drugs, medicaments and biological substances status: Secondary | ICD-10-CM | POA: Diagnosis not present

## 2016-11-22 DIAGNOSIS — R197 Diarrhea, unspecified: Principal | ICD-10-CM | POA: Diagnosis present

## 2016-11-22 DIAGNOSIS — E039 Hypothyroidism, unspecified: Secondary | ICD-10-CM | POA: Diagnosis not present

## 2016-11-22 DIAGNOSIS — N390 Urinary tract infection, site not specified: Secondary | ICD-10-CM | POA: Insufficient documentation

## 2016-11-22 LAB — I-STAT CHEM 8, ED
BUN: 16 mg/dL (ref 6–20)
CHLORIDE: 103 mmol/L (ref 101–111)
Calcium, Ion: 1.1 mmol/L — ABNORMAL LOW (ref 1.15–1.40)
Creatinine, Ser: 0.8 mg/dL (ref 0.44–1.00)
Glucose, Bld: 417 mg/dL — ABNORMAL HIGH (ref 65–99)
HEMATOCRIT: 35 % — AB (ref 36.0–46.0)
Hemoglobin: 11.9 g/dL — ABNORMAL LOW (ref 12.0–15.0)
Potassium: 3.9 mmol/L (ref 3.5–5.1)
SODIUM: 139 mmol/L (ref 135–145)
TCO2: 29 mmol/L (ref 22–32)

## 2016-11-22 LAB — CBC WITH DIFFERENTIAL/PLATELET
Basophils Absolute: 0 10*3/uL (ref 0.0–0.1)
Basophils Relative: 0 %
EOS ABS: 0.2 10*3/uL (ref 0.0–0.7)
EOS PCT: 2 %
HCT: 36.8 % (ref 36.0–46.0)
Hemoglobin: 13 g/dL (ref 12.0–15.0)
LYMPHS ABS: 3.7 10*3/uL (ref 0.7–4.0)
LYMPHS PCT: 48 %
MCH: 31 pg (ref 26.0–34.0)
MCHC: 35.3 g/dL (ref 30.0–36.0)
MCV: 87.8 fL (ref 78.0–100.0)
MONO ABS: 0.6 10*3/uL (ref 0.1–1.0)
MONOS PCT: 8 %
Neutro Abs: 3.2 10*3/uL (ref 1.7–7.7)
Neutrophils Relative %: 42 %
PLATELETS: 268 10*3/uL (ref 150–400)
RBC: 4.19 MIL/uL (ref 3.87–5.11)
RDW: 13 % (ref 11.5–15.5)
WBC: 7.6 10*3/uL (ref 4.0–10.5)

## 2016-11-22 LAB — URINALYSIS, ROUTINE W REFLEX MICROSCOPIC
BILIRUBIN URINE: NEGATIVE
Bacteria, UA: NONE SEEN
Glucose, UA: >=500 mg/dL — AB
HGB URINE DIPSTICK: NEGATIVE
KETONES UR: NEGATIVE mg/dL
Nitrite: NEGATIVE
PH: 5 (ref 5.0–8.0)
Protein, ur: 30 mg/dL — AB
SPECIFIC GRAVITY, URINE: 1.032 — AB (ref 1.005–1.030)

## 2016-11-22 LAB — COMPREHENSIVE METABOLIC PANEL
ALBUMIN: 3.5 g/dL (ref 3.5–5.0)
ALK PHOS: 73 U/L (ref 38–126)
ALT: 18 U/L (ref 14–54)
AST: 23 U/L (ref 15–41)
Anion gap: 9 (ref 5–15)
BILIRUBIN TOTAL: 0.3 mg/dL (ref 0.3–1.2)
BUN: 15 mg/dL (ref 6–20)
CALCIUM: 8.7 mg/dL — AB (ref 8.9–10.3)
CO2: 27 mmol/L (ref 22–32)
Chloride: 102 mmol/L (ref 101–111)
Creatinine, Ser: 0.9 mg/dL (ref 0.44–1.00)
GFR calc Af Amer: >60 mL/min (ref >60)
GFR calc non Af Amer: >60 mL/min (ref >60)
GLUCOSE: 398 mg/dL — AB (ref 65–99)
POTASSIUM: 4 mmol/L (ref 3.5–5.1)
Sodium: 138 mmol/L (ref 135–145)
TOTAL PROTEIN: 6.8 g/dL (ref 6.5–8.1)

## 2016-11-22 LAB — CBG MONITORING, ED: GLUCOSE-CAPILLARY: 530 mg/dL — AB (ref 65–99)

## 2016-11-22 LAB — LIPASE, BLOOD: Lipase: 20 U/L (ref 11–51)

## 2016-11-22 LAB — CK: Total CK: 81 U/L (ref 38–234)

## 2016-11-22 LAB — MAGNESIUM: Magnesium: 1.6 mg/dL — ABNORMAL LOW (ref 1.7–2.4)

## 2016-11-22 MED ORDER — FENTANYL CITRATE (PF) 100 MCG/2ML IJ SOLN
50.0000 ug | Freq: Once | INTRAMUSCULAR | Status: AC
  Administered 2016-11-23: 50 ug via INTRAVENOUS
  Filled 2016-11-22: qty 2

## 2016-11-22 MED ORDER — GI COCKTAIL ~~LOC~~
30.0000 mL | Freq: Once | ORAL | Status: AC
  Administered 2016-11-22: 30 mL via ORAL
  Filled 2016-11-22: qty 30

## 2016-11-22 MED ORDER — ONDANSETRON 4 MG PO TBDP
4.0000 mg | ORAL_TABLET | Freq: Once | ORAL | Status: AC | PRN
  Administered 2016-11-22: 4 mg via ORAL
  Filled 2016-11-22: qty 1

## 2016-11-22 MED ORDER — ATENOLOL 50 MG PO TABS
50.0000 mg | ORAL_TABLET | Freq: Once | ORAL | Status: AC
  Administered 2016-11-23: 50 mg via ORAL
  Filled 2016-11-22: qty 1

## 2016-11-22 MED ORDER — PROCHLORPERAZINE EDISYLATE 5 MG/ML IJ SOLN
5.0000 mg | Freq: Once | INTRAMUSCULAR | Status: AC
  Administered 2016-11-22: 5 mg via INTRAVENOUS
  Filled 2016-11-22: qty 2

## 2016-11-22 MED ORDER — ONDANSETRON HCL 4 MG/2ML IJ SOLN
4.0000 mg | Freq: Once | INTRAMUSCULAR | Status: AC
  Administered 2016-11-22: 4 mg via INTRAVENOUS
  Filled 2016-11-22: qty 2

## 2016-11-22 MED ORDER — FENTANYL CITRATE (PF) 100 MCG/2ML IJ SOLN
50.0000 ug | Freq: Once | INTRAMUSCULAR | Status: AC
Start: 2016-11-22 — End: 2016-11-22
  Administered 2016-11-22: 50 ug via INTRAVENOUS
  Filled 2016-11-22: qty 2

## 2016-11-22 MED ORDER — DIPHENHYDRAMINE HCL 50 MG/ML IJ SOLN
12.5000 mg | Freq: Once | INTRAMUSCULAR | Status: AC
  Administered 2016-11-22: 12.5 mg via INTRAVENOUS
  Filled 2016-11-22: qty 1

## 2016-11-22 MED ORDER — SODIUM CHLORIDE 0.9 % IV BOLUS (SEPSIS)
1000.0000 mL | Freq: Once | INTRAVENOUS | Status: AC
  Administered 2016-11-22: 1000 mL via INTRAVENOUS

## 2016-11-22 MED ORDER — LISINOPRIL 10 MG PO TABS
10.0000 mg | ORAL_TABLET | Freq: Once | ORAL | Status: AC
  Administered 2016-11-23: 10 mg via ORAL
  Filled 2016-11-22: qty 1

## 2016-11-22 MED ORDER — MAGNESIUM OXIDE 400 (241.3 MG) MG PO TABS
800.0000 mg | ORAL_TABLET | Freq: Once | ORAL | Status: AC
  Administered 2016-11-23: 800 mg via ORAL
  Filled 2016-11-22: qty 2

## 2016-11-22 MED ORDER — SODIUM CHLORIDE 0.9 % IV BOLUS (SEPSIS)
500.0000 mL | Freq: Once | INTRAVENOUS | Status: AC
  Administered 2016-11-23: 500 mL via INTRAVENOUS

## 2016-11-22 NOTE — ED Provider Notes (Signed)
[facility] Provider Note   CSN: [id number] Arrival date & time: 11/22/16  1410     History   Chief Complaint Chief Complaint  Patient presents with  . Emesis  . Diarrhea  . Hyperglycemia    HPI Andrea [name] is a 66 y.o. female.  66 yo F with a chief complaint of nausea vomiting diarrhea.  Going on for the past 4 days.  The patient states that is been continuous.  She has been laying around on the floor and unable to get up for the past couple days.  She was able to get up yesterday and then with continued vomiting and diarrhea decided to come to the emergency department.  Had a subjective fever at the onset.  Denies suspicious food intake.  Denies recent antibiotic use.  Is having some all over crampy abdominal pain that continues.   The history is provided by the patient.  Emesis   This is a new problem. The current episode started less than 1 hour ago. The problem occurs continuously. Associated symptoms include diarrhea. Pertinent negatives include no arthralgias, no chills, no fever, no headaches and no myalgias.  Diarrhea   Associated symptoms include vomiting. Pertinent negatives include no chills, no headaches, no arthralgias and no myalgias.  Hyperglycemia  Associated symptoms: nausea and vomiting   Associated symptoms: no chest pain, no dizziness, no dysuria, no fever and no shortness of breath   Illness  This is a new problem. The current episode started 2 days ago. The problem occurs constantly. The problem has not changed since onset.Pertinent negatives include no chest pain, no headaches and no shortness of breath. Nothing aggravates the symptoms. Nothing relieves the symptoms. She has tried nothing for the symptoms. The treatment provided no relief.    Past Medical History:  Diagnosis Date  . Breast cancer ([place])   . Diabetes mellitus without complication ([place])   . Esophageal reflux   . Hypertension   .  Hypothyroid   . Migraine   . Seizure [facility])     Patient Active Problem List   Diagnosis Date Noted  . Nausea & vomiting 11/23/2016  . Diarrhea 11/23/2016  . UTI (urinary tract infection) 12/03/2013  . Fall 12/02/2013  . Hypertension   . Breast cancer ([place])   . Diabetes mellitus without complication ([place])   . Migraine   . Hypothyroid   . Epilepsy ([place])   . Esophageal reflux   . Multiple rib fractures 11/28/2013    Past Surgical History:  Procedure Laterality Date  . ABDOMINAL HYSTERECTOMY    . APPENDECTOMY    . CHOLECYSTECTOMY    . EYE SURGERY  08/28/2015   Right eye  . KNEE SURGERY    . MASTECTOMY      OB History    No data available       Home Medications    Prior to Admission medications   Medication Sig Start Date End Date Taking? Authorizing Provider  ALPRAZolam (XANAX) 0.5 MG tablet TAKE 1 TABLET BY MOUTH 4 TIMES A DAY AS NEEDED FOR ANXIETY 11/21/16  Yes [provider]  atenolol (TENORMIN) 50 MG tablet Take 50 mg by mouth 2 (two) times daily.    Yes [provider]  latanoprost (XALATAN) 0.005 % ophthalmic solution Place 1 drop into both eyes at bedtime.    Yes [provider]  levothyroxine (SYNTHROID, LEVOTHROID) 200 MCG tablet Take 200 mcg by mouth at bedtime.  10/02/15  Yes [provider]  lisinopril (PRINIVIL,ZESTRIL) 10 MG tablet Take 10 mg by mouth at bedtime.    Yes [provider]  ciprofloxacin (CIPRO) 500 MG tablet Take 1 tablet (500 mg total) 2 (two) times daily by mouth. 11/24/16   Benito Mccreedy, MD  divalproex (DEPAKOTE) 500 MG DR tablet Take 500 mg by mouth at bedtime.     [provider]  EMBEDA 20-0.8 MG CPCR Take 1 tablet by mouth daily. 10/24/16   [provider]  insulin glargine (LANTUS) 100 UNIT/ML injection Inject 30 Units into the skin daily.     [provider]  miconazole (MICOTIN) 100 MG vaginal suppository Place 100 mg vaginally as needed (Yeast infection).     [provider]  ondansetron (ZOFRAN ODT) 8 MG disintegrating tablet Take 1 tablet (8 mg total) by mouth every 8 (eight) hours as needed for nausea or vomiting. Patient not taking: Reported on 11/30/2015 11/23/15   Jeannett Senior, PA-C  ondansetron (ZOFRAN) 4 MG tablet Take 1 tablet (4 mg total) daily as needed by mouth for nausea or vomiting. 11/24/16 11/24/17  Benito Mccreedy, MD  ondansetron (ZOFRAN) 4 MG/2ML SOLN injection Inject 2 mLs (4 mg total) every 6 (six) hours as needed into the vein for nausea or vomiting. 11/24/16   Benito Mccreedy, MD  oxyCODONE-acetaminophen (PERCOCET) 10-325 MG tablet Take 1 tablet by mouth every 6 (six) hours as needed for pain. Patient not taking: Reported on 11/22/2016 09/25/15   Margarita Mail, PA-C  pregabalin (LYRICA) 75 MG capsule Take 1 capsule (75 mg total) 2 (two) times daily by mouth. 11/24/16   Benito Mccreedy, MD  promethazine (PHENERGAN) 25 MG suppository Place 1 suppository (25 mg total) rectally every 6 (six) hours as needed for nausea or vomiting. Patient not taking: Reported on 07/17/2016 11/30/15   Frederica Kuster, PA-C  sucralfate (CARAFATE) 1 GM/10ML suspension Take 10 mLs (1 g total) by mouth 4 (four) times daily -  with meals and at bedtime. Patient not taking: Reported on 07/17/2016 11/30/15   Frederica Kuster, PA-C    Family History History reviewed. No pertinent family history.  Social History Social History   Tobacco Use  . Smoking status: Never Smoker  . Smokeless tobacco: Never Used  Substance Use Topics  . Alcohol use: No  . Drug use: No     Allergies   Aspirin; Maxzide [hydrochlorothiazide w-triamterene]; Metformin and related; Nsaids; Other; Pravachol [pravastatin]; Stadol [butorphanol]; Toradol [ketorolac tromethamine]; Vistaril [hydroxyzine hcl]; Erythromycin; Morphine and related; and Penicillins   Review of Systems Review of Systems  Constitutional: Negative for chills and fever.  HENT: Negative for  congestion and rhinorrhea.   Eyes: Negative for redness and visual disturbance.  Respiratory: Negative for shortness of breath and wheezing.   Cardiovascular: Negative for chest pain and palpitations.  Gastrointestinal: Positive for diarrhea, nausea and vomiting.  Genitourinary: Negative for dysuria and urgency.  Musculoskeletal: Negative for arthralgias and myalgias.  Skin: Negative for pallor and wound.  Neurological: Negative for dizziness and headaches.     Physical Exam Updated Vital Signs BP (!) 150/68 (BP Location: Right Arm)   Pulse 80   Temp 98.4 F (36.9 C) (Oral)   Resp 16   Ht 5\' 5"  (1.651 m)   Wt 70.8 kg (156 lb 1.4 oz)   SpO2 98%   BMI 25.97 kg/m   Physical Exam  Constitutional: She is oriented to person, place, and time. She appears well-developed and well-nourished. No distress.  HENT:  Head: Normocephalic and  atraumatic.  Eyes: Pupils are equal, round, and reactive to light. EOM are normal.  Neck: Normal range of motion. Neck supple.  Cardiovascular: Normal rate and regular rhythm.  Exam reveals no gallop and no friction rub.   No murmur heard. Pulmonary/Chest: Effort normal. She has no wheezes. She has no rales.  Abdominal: Soft. She exhibits no distension and no mass. There is tenderness (Mild diffuse). There is no guarding.  Musculoskeletal: She exhibits no edema or tenderness.  Neurological: She is alert and oriented to person, place, and time.  Skin: Skin is warm and dry. She is not diaphoretic.  Psychiatric: She has a normal mood and affect. Her behavior is normal.  Nursing note and vitals reviewed.    ED Treatments / Results  Labs (all labs ordered are listed, but only abnormal results are displayed) Labs Reviewed  COMPREHENSIVE METABOLIC PANEL - Abnormal; Notable for the following components:      Result Value   Glucose, Bld 398 (*)    Calcium 8.7 (*)    All other components within normal limits  URINALYSIS, ROUTINE W REFLEX MICROSCOPIC -  Abnormal; Notable for the following components:   Specific Gravity, Urine 1.032 (*)    Glucose, UA >=500 (*)    Protein, ur 30 (*)    Leukocytes, UA SMALL (*)    Squamous Epithelial / LPF 0-5 (*)    All other components within normal limits  MAGNESIUM - Abnormal; Notable for the following components:   Magnesium 1.6 (*)    All other components within normal limits  COMPREHENSIVE METABOLIC PANEL - Abnormal; Notable for the following components:   Chloride 99 (*)    Glucose, Bld 302 (*)    All other components within normal limits  CBC - Abnormal; Notable for the following components:   Hemoglobin 15.1 (*)    All other components within normal limits  GLUCOSE, CAPILLARY - Abnormal; Notable for the following components:   Glucose-Capillary 295 (*)    All other components within normal limits  GLUCOSE, CAPILLARY - Abnormal; Notable for the following components:   Glucose-Capillary 218 (*)    All other components within normal limits  CBC - Abnormal; Notable for the following components:   HCT 35.7 (*)    All other components within normal limits  BASIC METABOLIC PANEL - Abnormal; Notable for the following components:   Chloride 100 (*)    Glucose, Bld 184 (*)    Calcium 8.5 (*)    All other components within normal limits  GLUCOSE, CAPILLARY - Abnormal; Notable for the following components:   Glucose-Capillary 165 (*)    All other components within normal limits  GLUCOSE, CAPILLARY - Abnormal; Notable for the following components:   Glucose-Capillary 197 (*)    All other components within normal limits  GLUCOSE, CAPILLARY - Abnormal; Notable for the following components:   Glucose-Capillary 149 (*)    All other components within normal limits  GLUCOSE, CAPILLARY - Abnormal; Notable for the following components:   Glucose-Capillary 179 (*)    All other components within normal limits  GLUCOSE, CAPILLARY - Abnormal; Notable for the following components:   Glucose-Capillary 226  (*)    All other components within normal limits  GLUCOSE, CAPILLARY - Abnormal; Notable for the following components:   Glucose-Capillary 226 (*)    All other components within normal limits  GLUCOSE, CAPILLARY - Abnormal; Notable for the following components:   Glucose-Capillary 182 (*)    All other components within normal limits  CBG MONITORING, ED - Abnormal; Notable for the following components:   Glucose-Capillary 530 (*)    All other components within normal limits  CBG MONITORING, ED - Abnormal; Notable for the following components:   Glucose-Capillary 328 (*)    All other components within normal limits  I-STAT CHEM 8, ED - Abnormal; Notable for the following components:   Glucose, Bld 417 (*)    Calcium, Ion 1.10 (*)    Hemoglobin 11.9 (*)    HCT 35.0 (*)    All other components within normal limits  GASTROINTESTINAL PANEL BY PCR, STOOL (REPLACES STOOL CULTURE)  LIPASE, BLOOD  CBC WITH DIFFERENTIAL/PLATELET  CK  HIV ANTIBODY (ROUTINE TESTING)  CBG MONITORING, ED    EKG  EKG Interpretation None       Radiology No results found.  Procedures Procedures (including critical care time) EJ placement: 20 gauge IV placed in L EJ. Skin prepped with alcohol pads, L EJ identified with Valsalva. Cannulated with good return of dark, non-pulsatile blood. Tachyderm placed after easily flushed with NS.  Medications Ordered in ED Medications  0.9 %  sodium chloride infusion ( Intravenous Stopped 11/23/16 1200)  ondansetron (ZOFRAN-ODT) disintegrating tablet 4 mg (4 mg Oral Given 11/22/16 1427)  sodium chloride 0.9 % bolus 1,000 mL (0 mLs Intravenous Stopped 11/22/16 2106)  fentaNYL (SUBLIMAZE) injection 50 mcg (50 mcg Intravenous Given 11/22/16 1853)  ondansetron (ZOFRAN) injection 4 mg (4 mg Intravenous Given 11/22/16 1858)  gi cocktail (Maalox,Lidocaine,Donnatal) (30 mLs Oral Given 11/22/16 1852)  prochlorperazine (COMPAZINE) injection 5 mg (5 mg Intravenous Given 11/22/16 2147)   diphenhydrAMINE (BENADRYL) injection 12.5 mg (12.5 mg Intravenous Given 11/22/16 2145)  magnesium oxide (MAG-OX) tablet 800 mg (800 mg Oral Given 11/23/16 0018)  sodium chloride 0.9 % bolus 500 mL (0 mLs Intravenous Stopped 11/23/16 0121)  lisinopril (PRINIVIL,ZESTRIL) tablet 10 mg (10 mg Oral Given 11/23/16 0018)  atenolol (TENORMIN) tablet 50 mg (50 mg Oral Given 11/23/16 0018)  fentaNYL (SUBLIMAZE) injection 50 mcg (50 mcg Intravenous Given 11/23/16 0019)  hydrALAZINE (APRESOLINE) injection 10 mg (10 mg Intravenous Given 11/23/16 0136)  fentaNYL (SUBLIMAZE) injection 50 mcg (50 mcg Intravenous Given 11/23/16 0136)     Initial Impression / Assessment and Plan / ED Course  I have reviewed the triage vital signs and the nursing notes.  Pertinent labs & imaging results that were available during my care of the patient were reviewed by me and considered in my medical decision making (see chart for details).     66 yo F with a chief complaint of abdominal pain.  Having nausea vomiting diarrhea for the past 4 days.  Stated that she was unable to get up off the ground for a couple days in the middle.  She is concerned because she still not been able to eat or drink anything.  We will give IV fluids nausea medicine check labs and reassess.  Patient is tolerating p.o.  She is feeling much better.  She is continuing to have significant lower abdominal pain.  Will obtain a CT.  I discussed the case with Dr. Darl Householder, please see his note for further details.  The patients results and plan were reviewed and discussed.   Any x-rays performed were independently reviewed by myself.   Differential diagnosis were considered with the presenting HPI.  Medications  0.9 %  sodium chloride infusion ( Intravenous Stopped 11/23/16 1200)  ondansetron (ZOFRAN-ODT) disintegrating tablet 4 mg (4 mg Oral Given 11/22/16 1427)  sodium chloride 0.9 %  bolus 1,000 mL (0 mLs Intravenous Stopped 11/22/16 2106)  fentaNYL (SUBLIMAZE)  injection 50 mcg (50 mcg Intravenous Given 11/22/16 1853)  ondansetron (ZOFRAN) injection 4 mg (4 mg Intravenous Given 11/22/16 1858)  gi cocktail (Maalox,Lidocaine,Donnatal) (30 mLs Oral Given 11/22/16 1852)  prochlorperazine (COMPAZINE) injection 5 mg (5 mg Intravenous Given 11/22/16 2147)  diphenhydrAMINE (BENADRYL) injection 12.5 mg (12.5 mg Intravenous Given 11/22/16 2145)  magnesium oxide (MAG-OX) tablet 800 mg (800 mg Oral Given 11/23/16 0018)  sodium chloride 0.9 % bolus 500 mL (0 mLs Intravenous Stopped 11/23/16 0121)  lisinopril (PRINIVIL,ZESTRIL) tablet 10 mg (10 mg Oral Given 11/23/16 0018)  atenolol (TENORMIN) tablet 50 mg (50 mg Oral Given 11/23/16 0018)  fentaNYL (SUBLIMAZE) injection 50 mcg (50 mcg Intravenous Given 11/23/16 0019)  hydrALAZINE (APRESOLINE) injection 10 mg (10 mg Intravenous Given 11/23/16 0136)  fentaNYL (SUBLIMAZE) injection 50 mcg (50 mcg Intravenous Given 11/23/16 0136)    Vitals:   11/23/16 1545 11/23/16 2030 11/24/16 0429 11/24/16 1501  BP: 126/64 130/65 (!) 142/74 (!) 150/68  Pulse: 74 69 76 80  Resp: 20 18 16 16   Temp: 98.9 F (37.2 C) 98.1 F (36.7 C) 98 F (36.7 C) 98.4 F (36.9 C)  TempSrc: Oral Oral Oral Oral  SpO2: 95% 97% 98% 98%  Weight:   70.8 kg (156 lb 1.4 oz)   Height:        Final diagnoses:  Nausea vomiting and diarrhea     Final Clinical Impressions(s) / ED Diagnoses   Final diagnoses:  Nausea vomiting and diarrhea    New Prescriptions This SmartLink is deprecated. Use AVSMEDLIST instead to display the medication list for a patient.   Deno Etienne, DO 11/25/16 1031

## 2016-11-22 NOTE — ED Notes (Signed)
Patient stuck twice for blood draw but unsuccessful. Patient reports normally have to use ultrasound for access.

## 2016-11-22 NOTE — ED Triage Notes (Signed)
Per [facility] patient from home for n/v/d since Tuesday. Patient taken Phenergan and Imodium without any relief.  Patient hasnt taken her insulin since Tuesday, CBG 470. Vitals: 206/88, HR 100, 95-96% on room air.

## 2016-11-23 ENCOUNTER — Encounter (HOSPITAL_COMMUNITY): Payer: Self-pay | Admitting: Internal Medicine

## 2016-11-23 DIAGNOSIS — R197 Diarrhea, unspecified: Principal | ICD-10-CM

## 2016-11-23 DIAGNOSIS — R112 Nausea with vomiting, unspecified: Secondary | ICD-10-CM | POA: Diagnosis present

## 2016-11-23 LAB — CBC
HCT: 43.2 % (ref 36.0–46.0)
HEMOGLOBIN: 15.1 g/dL — AB (ref 12.0–15.0)
MCH: 30.6 pg (ref 26.0–34.0)
MCHC: 35 g/dL (ref 30.0–36.0)
MCV: 87.6 fL (ref 78.0–100.0)
PLATELETS: 207 10*3/uL (ref 150–400)
RBC: 4.93 MIL/uL (ref 3.87–5.11)
RDW: 12.8 % (ref 11.5–15.5)
WBC: 8.4 10*3/uL (ref 4.0–10.5)

## 2016-11-23 LAB — CBG MONITORING, ED: Glucose-Capillary: 328 mg/dL — ABNORMAL HIGH (ref 65–99)

## 2016-11-23 LAB — COMPREHENSIVE METABOLIC PANEL
ALT: 20 U/L (ref 14–54)
AST: 25 U/L (ref 15–41)
Albumin: 3.8 g/dL (ref 3.5–5.0)
Alkaline Phosphatase: 70 U/L (ref 38–126)
Anion gap: 11 (ref 5–15)
BUN: 10 mg/dL (ref 6–20)
CO2: 28 mmol/L (ref 22–32)
Calcium: 9.1 mg/dL (ref 8.9–10.3)
Chloride: 99 mmol/L — ABNORMAL LOW (ref 101–111)
Creatinine, Ser: 0.74 mg/dL (ref 0.44–1.00)
GFR calc Af Amer: >60 mL/min (ref >60)
GFR calc non Af Amer: >60 mL/min (ref >60)
Glucose, Bld: 302 mg/dL — ABNORMAL HIGH (ref 65–99)
Potassium: 3.6 mmol/L (ref 3.5–5.1)
Sodium: 138 mmol/L (ref 135–145)
Total Bilirubin: 0.7 mg/dL (ref 0.3–1.2)
Total Protein: 7.8 g/dL (ref 6.5–8.1)

## 2016-11-23 LAB — GLUCOSE, CAPILLARY
GLUCOSE-CAPILLARY: 218 mg/dL — AB (ref 65–99)
Glucose-Capillary: 295 mg/dL — ABNORMAL HIGH (ref 65–99)
Glucose-Capillary: 165 mg/dL — ABNORMAL HIGH (ref 65–99)

## 2016-11-23 MED ORDER — LISINOPRIL 10 MG PO TABS
10.0000 mg | ORAL_TABLET | Freq: Every day | ORAL | Status: DC
  Administered 2016-11-23: 10 mg via ORAL
  Filled 2016-11-23: qty 1

## 2016-11-23 MED ORDER — MORPHINE SULFATE ER 15 MG PO TBCR
15.0000 mg | EXTENDED_RELEASE_TABLET | Freq: Two times a day (BID) | ORAL | Status: DC
  Administered 2016-11-23 – 2016-11-24 (×3): 15 mg via ORAL
  Filled 2016-11-23 (×3): qty 1

## 2016-11-23 MED ORDER — CIPROFLOXACIN HCL 500 MG PO TABS
500.0000 mg | ORAL_TABLET | Freq: Two times a day (BID) | ORAL | Status: DC
  Administered 2016-11-23 – 2016-11-24 (×2): 500 mg via ORAL
  Filled 2016-11-23 (×2): qty 1

## 2016-11-23 MED ORDER — ACETAMINOPHEN 650 MG RE SUPP: 650.0000 mg | Freq: Four times a day (QID) | RECTAL | Status: DC | PRN

## 2016-11-23 MED ORDER — LATANOPROST 0.005 % OP SOLN
1.0000 [drp] | Freq: Every day | OPHTHALMIC | Status: DC
  Administered 2016-11-23: 1 [drp] via OPHTHALMIC
  Filled 2016-11-23: qty 2.5

## 2016-11-23 MED ORDER — OXYCODONE-ACETAMINOPHEN 10-325 MG PO TABS: 1.0000 | ORAL_TABLET | Freq: Three times a day (TID) | ORAL | Status: DC | PRN

## 2016-11-23 MED ORDER — LEVOTHYROXINE SODIUM 100 MCG PO TABS
200.0000 ug | ORAL_TABLET | Freq: Every day | ORAL | Status: DC
  Administered 2016-11-23: 200 ug via ORAL
  Filled 2016-11-23: qty 2

## 2016-11-23 MED ORDER — ATENOLOL 50 MG PO TABS
50.0000 mg | ORAL_TABLET | Freq: Two times a day (BID) | ORAL | Status: DC
  Administered 2016-11-23 – 2016-11-24 (×3): 50 mg via ORAL
  Filled 2016-11-23 (×3): qty 1

## 2016-11-23 MED ORDER — OXYCODONE-ACETAMINOPHEN 5-325 MG PO TABS
1.0000 | ORAL_TABLET | Freq: Three times a day (TID) | ORAL | Status: DC | PRN
  Administered 2016-11-23 – 2016-11-24 (×5): 1 via ORAL
  Filled 2016-11-23 (×5): qty 1

## 2016-11-23 MED ORDER — MORPHINE-NALTREXONE 20-0.8 MG PO CPCR: 1.0000 | ORAL_CAPSULE | Freq: Every day | ORAL | Status: DC

## 2016-11-23 MED ORDER — INFLUENZA VAC SPLIT HIGH-DOSE 0.5 ML IM SUSY
0.5000 mL | PREFILLED_SYRINGE | INTRAMUSCULAR | Status: DC
  Filled 2016-11-23: qty 0.5

## 2016-11-23 MED ORDER — CIPROFLOXACIN IN D5W 400 MG/200ML IV SOLN
400.0000 mg | Freq: Two times a day (BID) | INTRAVENOUS | Status: DC
  Administered 2016-11-23: 400 mg via INTRAVENOUS
  Filled 2016-11-23 (×2): qty 200

## 2016-11-23 MED ORDER — INSULIN GLARGINE 100 UNIT/ML ~~LOC~~ SOLN
30.0000 [IU] | Freq: Every day | SUBCUTANEOUS | Status: DC
  Administered 2016-11-23 – 2016-11-24 (×2): 30 [IU] via SUBCUTANEOUS
  Filled 2016-11-23 (×2): qty 0.3

## 2016-11-23 MED ORDER — PROMETHAZINE HCL 25 MG PO TABS
25.0000 mg | ORAL_TABLET | Freq: Four times a day (QID) | ORAL | Status: DC | PRN
  Administered 2016-11-24: 25 mg via ORAL
  Filled 2016-11-23: qty 1

## 2016-11-23 MED ORDER — HYDRALAZINE HCL 20 MG/ML IJ SOLN
10.0000 mg | Freq: Once | INTRAMUSCULAR | Status: AC
  Administered 2016-11-23: 10 mg via INTRAVENOUS
  Filled 2016-11-23: qty 1

## 2016-11-23 MED ORDER — LOPERAMIDE HCL 2 MG PO CAPS: 2.0000 mg | ORAL_CAPSULE | ORAL | Status: DC | PRN

## 2016-11-23 MED ORDER — ONDANSETRON HCL 4 MG/2ML IJ SOLN
4.0000 mg | Freq: Four times a day (QID) | INTRAMUSCULAR | Status: DC | PRN
  Administered 2016-11-24: 4 mg via INTRAVENOUS
  Filled 2016-11-23: qty 2

## 2016-11-23 MED ORDER — ALPRAZOLAM 0.5 MG PO TABS
0.5000 mg | ORAL_TABLET | Freq: Four times a day (QID) | ORAL | Status: DC | PRN
  Administered 2016-11-24: 0.5 mg via ORAL
  Filled 2016-11-23: qty 1

## 2016-11-23 MED ORDER — ENOXAPARIN SODIUM 40 MG/0.4ML ~~LOC~~ SOLN
40.0000 mg | SUBCUTANEOUS | Status: DC
  Administered 2016-11-23 – 2016-11-24 (×2): 40 mg via SUBCUTANEOUS
  Filled 2016-11-23 (×2): qty 0.4

## 2016-11-23 MED ORDER — DIVALPROEX SODIUM 250 MG PO DR TAB
500.0000 mg | DELAYED_RELEASE_TABLET | Freq: Every day | ORAL | Status: DC
  Administered 2016-11-23: 500 mg via ORAL
  Filled 2016-11-23: qty 2

## 2016-11-23 MED ORDER — HYDRALAZINE HCL 20 MG/ML IJ SOLN: 10.0000 mg | Freq: Four times a day (QID) | INTRAMUSCULAR | Status: DC | PRN

## 2016-11-23 MED ORDER — ACETAMINOPHEN 325 MG PO TABS: 650.0000 mg | ORAL_TABLET | Freq: Four times a day (QID) | ORAL | Status: DC | PRN

## 2016-11-23 MED ORDER — CIPROFLOXACIN IN D5W 400 MG/200ML IV SOLN: 400.0000 mg | Freq: Once | INTRAVENOUS | Status: DC

## 2016-11-23 MED ORDER — OXYCODONE HCL 5 MG PO TABS
5.0000 mg | ORAL_TABLET | Freq: Three times a day (TID) | ORAL | Status: DC | PRN
  Administered 2016-11-23 – 2016-11-24 (×5): 5 mg via ORAL
  Filled 2016-11-23 (×5): qty 1

## 2016-11-23 MED ORDER — FENTANYL CITRATE (PF) 100 MCG/2ML IJ SOLN
50.0000 ug | Freq: Once | INTRAMUSCULAR | Status: AC
  Administered 2016-11-23: 50 ug via INTRAVENOUS
  Filled 2016-11-23: qty 2

## 2016-11-23 MED ORDER — SACCHAROMYCES BOULARDII 250 MG PO CAPS
250.0000 mg | ORAL_CAPSULE | Freq: Two times a day (BID) | ORAL | Status: DC
  Administered 2016-11-23 – 2016-11-24 (×3): 250 mg via ORAL
  Filled 2016-11-23 (×3): qty 1

## 2016-11-23 MED ORDER — PREGABALIN 75 MG PO CAPS
75.0000 mg | ORAL_CAPSULE | Freq: Two times a day (BID) | ORAL | Status: DC
  Administered 2016-11-23 – 2016-11-24 (×3): 75 mg via ORAL
  Filled 2016-11-23 (×3): qty 1

## 2016-11-23 MED ORDER — INSULIN ASPART 100 UNIT/ML ~~LOC~~ SOLN
0.0000 [IU] | Freq: Three times a day (TID) | SUBCUTANEOUS | Status: DC
  Administered 2016-11-23: 3 [IU] via SUBCUTANEOUS
  Administered 2016-11-23: 2 [IU] via SUBCUTANEOUS
  Administered 2016-11-23: 5 [IU] via SUBCUTANEOUS
  Administered 2016-11-24 (×2): 3 [IU] via SUBCUTANEOUS
  Administered 2016-11-24: 2 [IU] via SUBCUTANEOUS

## 2016-11-23 MED ORDER — SODIUM CHLORIDE 0.9 % IV SOLN
INTRAVENOUS | Status: AC
  Administered 2016-11-23: 06:00:00 via INTRAVENOUS

## 2016-11-23 NOTE — Progress Notes (Signed)
Patient lost EJ access. (This was placed previously after many failed attempts at other IV access.)  MD Osei-Bonsu notified.  MD stated that as long as patient is able to keep down clear liquids that she may discharge on 11-4.  Otherwise, if nausea & vomiting, would have to page [facility] order for PICC line 2/2 patient's poor vasculature.  Continue to monitor.

## 2016-11-23 NOTE — ED Notes (Signed)
Bed: WA24 Expected date:  Expected time:  Means of arrival:  Comments: Held for [employer]

## 2016-11-23 NOTE — ED Provider Notes (Signed)
  Physical Exam  BP (!) 208/94   Pulse 91   Temp 97.9 F (36.6 C) (Oral)   Resp 14   Ht 5\' 5"  (1.651 m)   Wt 67.6 kg (149 lb)   SpO2 100%   BMI 24.79 kg/m   Physical Exam  ED Course  Procedures  MDM Patient care assumed at sign out. Patient here with vomiting, diarrhea, hypertension, hyperglycemia. Sign out pending CT renal stone. CT unremarkable. Glucose 398, AG nl. Still nauseated and hypertensive despite BP meds. Still has lower abdominal pain. Will admit for observation for hydration, hypertension.       Drenda Freeze, MD 11/23/16 [phone]

## 2016-11-23 NOTE — Progress Notes (Signed)
Admitted this morning for nausea vomiting and diarrhea concerning for gastroenteritis.  No episode of vomiting or diarrhea this morning on rounds.  Hemodynamically stable and comfortable, does not look toxic, CBGs high and sliding-scale to be initiated. Continue isolation and follow-up on C. difficile toxin report-white count within normal limits. Continue Cipro for UTI follow-up on cultures

## 2016-11-23 NOTE — H&P (Addendum)
TRH H&P   Patient Demographics:    Andrea [name], is a 66 y.o. female  MRN: [id number]   DOB - Jul 19, 1950  Admit Date - 11/22/2016  Outpatient Primary MD for the patient is Medicine, [facility]  Referring MD/NP/PA: Shirlyn Goltz  Outpatient Specialists:   Patient coming from: home  Chief Complaint  Patient presents with  . Emesis  . Diarrhea  . Hyperglycemia      HPI:    Andrea [name]  is a 66 y.o. female, w diarrhea and vomitting since Tuesday.   LLQ pain "achy" intermittent.  Pt denies fever, chills, cough, cp, palp, sob.    In ED,  Wbc 7.6, Hgb 13.0, Plt  268 Glucose 398, Bun 15, Creatinine 0.9 Ast 23, Alt 18,  CT  IMPRESSION: 1. No acute bowel obstruction or inflammation. 2. Pelvic midline left kidney with stable right renal cyst noted. Left-sided renal cyst is not well visualized on this unenhanced exam.   Ua=> wbc 6-30.   Pt will be admitted for n/v ,diarrhea.  ? Gastroenteritis.    Review of systems:    In addition to the HPI above,   No Fever-chills, No Headache, No changes with Vision or hearing, No problems swallowing food or Liquids, No Chest pain, Cough or Shortness of Breath,No Blood in stool or Urine, No dysuria, No new skin rashes or bruises, No new joints pains-aches,  No new weakness, tingling, numbness in any extremity, No recent weight gain or loss, No polyuria, polydypsia or polyphagia, No significant Mental Stressors.  A full 10 point Review of Systems was done, except as stated above, all other Review of Systems were negative.   With Past History of the following :    Past Medical History:  Diagnosis Date  . Breast cancer ([place])   . Diabetes mellitus without complication ([place])   . Esophageal reflux   . Hypertension   . Hypothyroid   . Migraine   . Seizure [facility])       Past Surgical History:    Procedure Laterality Date  . ABDOMINAL HYSTERECTOMY    . APPENDECTOMY    . CHOLECYSTECTOMY    . EYE SURGERY  08/28/2015   Right eye  . KNEE SURGERY    . MASTECTOMY        Social History:     Social History  Substance Use Topics  . Smoking status: Never Smoker  . Smokeless tobacco: Never Used  . Alcohol use No     Lives -  At home Mobility walks by self   Family History :    History reviewed. No pertinent family history. Mother and Father died in [place] Medications:   Prior to Admission medications   Medication Sig Start Date End Date Taking? Authorizing Provider  ALPRAZolam (XANAX) 0.5 MG tablet TAKE 1 TABLET BY MOUTH 4 TIMES A DAY AS  NEEDED FOR ANXIETY 11/21/16  Yes [provider]  atenolol (TENORMIN) 50 MG tablet Take 50 mg by mouth 2 (two) times daily.    Yes [provider]  CRANBERRY PO Take 1 tablet by mouth daily.   Yes [provider]  latanoprost (XALATAN) 0.005 % ophthalmic solution Place 1 drop into both eyes at bedtime.    Yes [provider]  levothyroxine (SYNTHROID, LEVOTHROID) 200 MCG tablet Take 200 mcg by mouth at bedtime.  10/02/15  Yes [provider]  lisinopril (PRINIVIL,ZESTRIL) 10 MG tablet Take 10 mg by mouth at bedtime.    Yes [provider]  oxyCODONE-acetaminophen (PERCOCET) 10-325 MG tablet TAKE 1 TABLET BY MOUTH 3 TIMES A DAY AS NEEDED FOR PAIN 07/30/16  Yes [provider]  promethazine (PHENERGAN) 25 MG tablet Take 25 mg by mouth every 6 (six) hours as needed for nausea. 06/28/16  Yes [provider]  ALPRAZolam Duanne Moron) 0.5 MG tablet Take 1 tablet (0.5 mg total) by mouth 4 (four) times daily. Patient not taking: Reported on 11/22/2016 12/06/13   Lisette Abu, PA-C  cephALEXin (KEFLEX) 500 MG capsule Take 1 capsule (500 mg total) by mouth 4 (four) times daily. Patient not taking: Reported on 11/22/2016 07/17/16   Deno Etienne, DO  clindamycin (CLEOCIN) 150 MG  capsule Take 2 capsules (300 mg total) by mouth 3 (three) times daily. Patient not taking: Reported on 11/30/2015 09/25/15   Margarita Mail, PA-C  divalproex (DEPAKOTE) 500 MG DR tablet Take 500 mg by mouth at bedtime.     [provider]  doxycycline (VIBRAMYCIN) 100 MG capsule Take 1 capsule (100 mg total) by mouth 2 (two) times daily. Patient not taking: Reported on 11/22/2016 09/12/16   Margarita Mail, PA-C  EMBEDA 20-0.8 MG CPCR Take 1 tablet by mouth daily. 10/24/16   [provider]  insulin glargine (LANTUS) 100 UNIT/ML injection Inject 30 Units into the skin daily.     [provider]  LYRICA 75 MG capsule Take 75 mg by mouth 2 (two) times daily. 10/13/15   [provider]  miconazole (MICOTIN) 100 MG vaginal suppository Place 100 mg vaginally as needed (Yeast infection).    [provider]  miconazole (MONISTAT 7) 2 % vaginal cream Place 1 Applicatorful vaginally as needed (yeast infection).    [provider]  nitrofurantoin, macrocrystal-monohydrate, (MACROBID) 100 MG capsule Take 1 capsule (100 mg total) by mouth 2 (two) times daily. Patient not taking: Reported on 07/17/2016 11/30/15   Frederica Kuster, PA-C  omeprazole (PRILOSEC) 20 MG capsule Take 1 capsule (20 mg total) by mouth daily. Patient not taking: Reported on 07/17/2016 11/30/15   Frederica Kuster, PA-C  ondansetron (ZOFRAN ODT) 8 MG disintegrating tablet Take 1 tablet (8 mg total) by mouth every 8 (eight) hours as needed for nausea or vomiting. Patient not taking: Reported on 11/30/2015 11/23/15   Jeannett Senior, PA-C  oxyCODONE-acetaminophen (PERCOCET) 10-325 MG tablet Take 1 tablet by mouth every 6 (six) hours as needed for pain. Patient not taking: Reported on 11/22/2016 09/25/15   Margarita Mail, PA-C  promethazine (PHENERGAN) 25 MG suppository Place 1 suppository (25 mg total) rectally every 6 (six) hours as needed for nausea or vomiting. Patient not taking: Reported on  07/17/2016 11/30/15   Frederica Kuster, PA-C  sucralfate (CARAFATE) 1 GM/10ML suspension Take 10 mLs (1 g total) by mouth 4 (four) times daily -  with meals and at bedtime. Patient not taking: Reported on 07/17/2016  11/30/15   Law, Bea Graff, PA-C  traMADol (ULTRAM) 50 MG tablet Take 0.5-1 tablets (25-50 mg total) by mouth every 6 (six) hours as needed. Patient not taking: Reported on 11/22/2016 09/12/16   Margarita Mail, PA-C     Allergies:     Allergies  Allergen Reactions  . Aspirin Nausea And Vomiting  . Maxzide [Hydrochlorothiazide W-Triamterene] Swelling    Fluid retention  . Metformin And Related Diarrhea  . Nsaids Nausea And Vomiting  . Other Other (See Comments)    All steroids produce psychosis per pt Artificial sweeteners produce nausea and upset stomach.  Gregary Cromer [Pravastatin] Other (See Comments)    unknown  . Stadol [Butorphanol] Other (See Comments)    Toradol, etc And related- hallucinations  . Toradol [Ketorolac Tromethamine] Other (See Comments)    hallucinations  . Vistaril [Hydroxyzine Hcl] Other (See Comments)    unknown  . Erythromycin Itching and Rash  . Morphine And Related Hives and Rash  . Penicillins Hives, Itching and Rash    Has patient had a PCN reaction causing immediate rash, facial/tongue/throat swelling, SOB or lightheadedness with hypotension: yes Has patient had a PCN reaction causing severe rash involving mucus membranes or skin necrosis: unknown Has patient had a PCN reaction that required hospitalization : unknown Has patient had a PCN reaction occurring within the last 10 years: no If all of the above answers are "NO", then may proceed with Cephalosporin use.      Physical Exam:   Vitals  Blood pressure (!) 183/83, pulse 81, temperature 97.9 F (36.6 C), temperature source Oral, resp. rate 14, height 5\' 5"  (1.651 m), weight 67.6 kg (149 lb), SpO2 100 %.   1. General  lying in bed in NAD,    2. Normal affect and insight, Not  Suicidal or Homicidal, Awake Alert, Oriented X 3.  3. No F.N deficits, ALL C.Nerves Intact, Strength 5/5 all 4 extremities, Sensation intact all 4 extremities, Plantars down going.  4. Ears and Eyes appear Normal, Conjunctivae clear, PERRLA. Moist Oral Mucosa.  5. Supple Neck, No JVD, No cervical lymphadenopathy appriciated, No Carotid Bruits.  6. Symmetrical Chest wall movement, Good air movement bilaterally, CTAB.  7. RRR, No Gallops, Rubs or Murmurs, No Parasternal Heave.  8. Positive Bowel Sounds, Abdomen Soft, No tenderness, No organomegaly appriciated,No rebound -guarding or rigidity.  9.  No Cyanosis, Normal Skin Turgor, No Skin Rash or Bruise.  10. Good muscle tone,  joints appear normal , no effusions, Normal ROM.  11. No Palpable Lymph Nodes in Neck or Axillae     Data Review:    CBC  Recent Labs Lab 11/22/16 1833 11/22/16 2143  WBC 7.6  --   HGB 13.0 11.9*  HCT 36.8 35.0*  PLT 268  --   MCV 87.8  --   MCH 31.0  --   MCHC 35.3  --   RDW 13.0  --   LYMPHSABS 3.7  --   MONOABS 0.6  --   EOSABS 0.2  --   BASOSABS 0.0  --    ------------------------------------------------------------------------------------------------------------------  Chemistries   Recent Labs Lab 11/22/16 1833 11/22/16 2143  NA 138 139  K 4.0 3.9  CL 102 103  CO2 27  --   GLUCOSE 398* 417*  BUN 15 16  CREATININE 0.90 0.80  CALCIUM 8.7*  --   MG 1.6*  --   AST 23  --   ALT 18  --   ALKPHOS 73  --  BILITOT 0.3  --    ------------------------------------------------------------------------------------------------------------------ estimated creatinine clearance is 63.1 mL/min (by C-G formula based on SCr of 0.8 mg/dL). ------------------------------------------------------------------------------------------------------------------ No results for input(s): TSH, T4TOTAL, T3FREE, THYROIDAB in the last 72 hours.  Invalid input(s): FREET3  Coagulation profile No results  for input(s): INR, PROTIME in the last 168 hours. ------------------------------------------------------------------------------------------------------------------- No results for input(s): DDIMER in the last 72 hours. -------------------------------------------------------------------------------------------------------------------  Cardiac Enzymes No results for input(s): CKMB, TROPONINI, MYOGLOBIN in the last 168 hours.  Invalid input(s): CK ------------------------------------------------------------------------------------------------------------------    Component Value Date/Time   BNP 71.5 11/30/2015 0721     ---------------------------------------------------------------------------------------------------------------  Urinalysis    Component Value Date/Time   COLORURINE YELLOW 11/22/2016 1552   APPEARANCEUR CLEAR 11/22/2016 1552   LABSPEC 1.032 (H) 11/22/2016 1552   PHURINE 5.0 11/22/2016 1552   GLUCOSEU >=500 (A) 11/22/2016 1552   HGBUR NEGATIVE 11/22/2016 1552   BILIRUBINUR NEGATIVE 11/22/2016 1552   KETONESUR NEGATIVE 11/22/2016 1552   PROTEINUR 30 (A) 11/22/2016 1552   UROBILINOGEN 1.0 12/02/2013 1612   NITRITE NEGATIVE 11/22/2016 1552   LEUKOCYTESUR SMALL (A) 11/22/2016 1552    ----------------------------------------------------------------------------------------------------------------   Imaging Results:    Ct Renal Stone Study  Result Date: 11/22/2016 CLINICAL DATA:  Abdominal pain, vomiting and diarrhea x3 days. History of breast cancer diagnosed in 2013 with bilateral mastectomies in 2014. Hysterectomy, cholecystectomy and appendectomy. EXAM: CT ABDOMEN AND PELVIS WITHOUT CONTRAST TECHNIQUE: Multidetector CT imaging of the abdomen and pelvis was performed following the standard protocol without IV contrast. COMPARISON:  11/23/2015 FINDINGS: Lower chest: Bilateral partially included saline breast implants. The included heart is top-normal in size with  coronary arteriosclerosis along the RCA. No pericardial effusion. Hepatobiliary: Cholecystectomy. No biliary dilatation. No mass noted of the liver given limitations of a noncontrast study. Pancreas: Pancreatic atrophy without ductal dilatation or definite mass. Spleen: No splenomegaly or mass. Adrenals/Urinary Tract: Normal bilateral adrenal glands. Pelvic midline left kidney. The cyst in the lower pole of the pelvic kidney is not well visualized given lack of IV contrast. There is a 15 mm interpolar right renal cyst unchanged. No nephrolithiasis or obstructive uropathy. Physiologic distention of the urinary bladder without focal mural thickening or calculus identified. Stomach/Bowel: The stomach is not distended in appearance and contains ingested food. There is normal small bowel rotation without obstruction or inflammation. The patient is status post appendectomy by report. No large bowel obstruction or significant stool burden. Vascular/Lymphatic: Moderate aortoiliac and branch vessel atherosclerosis without aneurysm. No lymphadenopathy. Reproductive: Uterus and bilateral adnexa are unremarkable. Other: No abdominal wall hernia or abnormality. No abdominopelvic ascites. Musculoskeletal: Chronic ossification adjacent to the right femoral head. No acute nor suspicious osseous abnormalities. IMPRESSION: 1. No acute bowel obstruction or inflammation. 2. Pelvic midline left kidney with stable right renal cyst noted. Left-sided renal cyst is not well visualized on this unenhanced exam. Electronically Signed   By: Ashley Royalty M.D.   On: 11/22/2016 23:35      Assessment & Plan:    Active Problems:   Nausea & vomiting    N/v zofran  UTI cipro iv Await urine culture  Diarrhea Gi pathogen panel C diff NPO Imodium prn Ns iv  Anxiety Cont xanax Cont depakote  Dm2 fsbs ac and qhs, ISS  Hypothyroid Cont levothyroxine  Chronic back pain Cont embeda, cont percocet  DVT Prophylaxis Lovenox -   SCDs   AM Labs Ordered, also please review Full Orders  Family Communication: Admission, patients condition and plan of care including tests being ordered  have been discussed with the patient who indicate understanding and agree with the plan and Code Status.  Code Status FULL CODE  Likely DC to  home  Condition GUARDED    Consults called: none  Admission status:  observation  Time spent in minutes : 45    Jani Gravel M.D on 11/23/2016 at 1:51 AM  Between 7am to 7pm - Pager - [phone]  . After 7pm go to [email or web address] - password [facility]  [facility] - Office  [phone]

## 2016-11-23 NOTE — ED Notes (Signed)
Rn can call report to Clarise Cruz [phone] 04:20

## 2016-11-24 DIAGNOSIS — R197 Diarrhea, unspecified: Secondary | ICD-10-CM | POA: Diagnosis not present

## 2016-11-24 LAB — GLUCOSE, CAPILLARY
GLUCOSE-CAPILLARY: 149 mg/dL — AB (ref 65–99)
GLUCOSE-CAPILLARY: 179 mg/dL — AB (ref 65–99)
GLUCOSE-CAPILLARY: 226 mg/dL — AB (ref 65–99)
Glucose-Capillary: 197 mg/dL — ABNORMAL HIGH (ref 65–99)
Glucose-Capillary: 226 mg/dL — ABNORMAL HIGH (ref 65–99)

## 2016-11-24 LAB — BASIC METABOLIC PANEL
Anion gap: 10 (ref 5–15)
BUN: 11 mg/dL (ref 6–20)
CHLORIDE: 100 mmol/L — AB (ref 101–111)
CO2: 25 mmol/L (ref 22–32)
Calcium: 8.5 mg/dL — ABNORMAL LOW (ref 8.9–10.3)
Creatinine, Ser: 0.96 mg/dL (ref 0.44–1.00)
GFR calc Af Amer: >60 mL/min (ref >60)
GFR calc non Af Amer: >60 mL/min (ref >60)
Glucose, Bld: 184 mg/dL — ABNORMAL HIGH (ref 65–99)
POTASSIUM: 4.1 mmol/L (ref 3.5–5.1)
SODIUM: 135 mmol/L (ref 135–145)

## 2016-11-24 LAB — CBC
HCT: 35.7 % — ABNORMAL LOW (ref 36.0–46.0)
HEMOGLOBIN: 12.4 g/dL (ref 12.0–15.0)
MCH: 30.9 pg (ref 26.0–34.0)
MCHC: 34.7 g/dL (ref 30.0–36.0)
MCV: 89 fL (ref 78.0–100.0)
Platelets: 247 10*3/uL (ref 150–400)
RBC: 4.01 MIL/uL (ref 3.87–5.11)
RDW: 13.1 % (ref 11.5–15.5)
WBC: 8.8 10*3/uL (ref 4.0–10.5)

## 2016-11-24 LAB — HIV ANTIBODY (ROUTINE TESTING W REFLEX): HIV Screen 4th Generation wRfx: NONREACTIVE

## 2016-11-24 MED ORDER — CIPROFLOXACIN HCL 500 MG PO TABS
500.0000 mg | ORAL_TABLET | Freq: Two times a day (BID) | ORAL | 0 refills | Status: DC
Start: 2016-11-24 — End: 2017-01-08

## 2016-11-24 MED ORDER — ONDANSETRON HCL 4 MG/2ML IJ SOLN: 4.0000 mg | Freq: Four times a day (QID) | INTRAMUSCULAR | 0 refills | Status: DC | PRN

## 2016-11-24 MED ORDER — ONDANSETRON HCL 4 MG PO TABS: 4.0000 mg | ORAL_TABLET | Freq: Every day | ORAL | 1 refills | Status: DC | PRN

## 2016-11-24 MED ORDER — PREGABALIN 75 MG PO CAPS: 75.0000 mg | ORAL_CAPSULE | Freq: Two times a day (BID) | ORAL | 0 refills | Status: DC

## 2016-11-24 NOTE — Progress Notes (Signed)
Spoke with Rn re PICC line.  States needs IV access.  VAS T has not attempted per flowsheet for PIV.  [place] RN VAS T RN notified, will attempt PIV prior to PICC placement.  Aware of hx of difficulty obtaining IV sites.

## 2016-11-24 NOTE — Discharge Summary (Signed)
Andrea [name], is a 66 y.o. female  DOB [birth year]  MRN [id number].  Admission date:  11/22/2016  Admitting Physician  Jani Gravel, MD  Discharge Date:  11/24/2016   Primary MD  Medicine, [facility]  Recommendations for primary care physician for things to follow:  CBC and basic metabolic panel  Admission Diagnosis  Diarrhea [R19.7]   Discharge Diagnosis  Diarrhea [R19.7]   Active Problems:   Nausea & vomiting   Diarrhea      Past Medical History:  Diagnosis Date  . Breast cancer ([place])   . Diabetes mellitus without complication ([place])   . Esophageal reflux   . Hypertension   . Hypothyroid   . Migraine   . Seizure [facility])     Past Surgical History:  Procedure Laterality Date  . ABDOMINAL HYSTERECTOMY    . APPENDECTOMY    . CHOLECYSTECTOMY    . EYE SURGERY  08/28/2015   Right eye  . KNEE SURGERY    . MASTECTOMY         HPI  from the history and physical done IV fluids the day of admission:  66 year old lady who presented with and admitted for nausea vomiting and diarrhea concerning for gastroenteritis with leukocytosis, with urinalysis consistent with UTI     Hospital Course:   Patient initiated on IV fluids and supportive care with antinausea medicine and antiemetics with enteric precautions.  Overnight she did not have any diarrhea no vomiting and improved subsequently without any C. difficile collection.  Failed antibiotic for UTI.  On rounds this morning patient without vomiting Or diarrhea, by complaint of nausea which has subsequently resolved at time of reevaluation in the late afternoon and was deemed appropriate for discharge outpatient follow-up          Discharge Condition: Stable  Follow UP- PCP in 1 week     Consults obtained - N/A  Diet and Activity recommendation:  As advised  Discharge Instructions    Discharge Instructions    Call MD for:  difficulty breathing, headache or visual disturbances   Complete by:  As directed    Call MD for:  extreme fatigue   Complete by:  As directed    Call MD for:  hives   Complete by:  As directed    Call MD for:  persistant dizziness or light-headedness   Complete by:  As directed    Call MD for:  persistant nausea and vomiting   Complete by:  As directed    Call MD for:  redness, tenderness, or signs of infection (pain, swelling, redness, odor or green/yellow discharge around incision site)   Complete by:  As directed    Call MD for:  severe uncontrolled pain   Complete by:  As directed    Call MD for:  temperature >100.4   Complete by:  As directed    Diet - low sodium heart healthy   Complete by:  As directed    Increase activity slowly   Complete by:  As directed         Discharge Medications     Allergies as of 11/24/2016      Reactions   Aspirin Nausea And Vomiting   Maxzide [hydrochlorothiazide W-triamterene] Swelling   Fluid retention   Metformin And Related Diarrhea   Nsaids Nausea And Vomiting   Other Other (See Comments)   All steroids produce psychosis per pt Artificial sweeteners produce nausea and upset stomach.   Pravachol [pravastatin] Other (See Comments)   unknown   Stadol [butorphanol] Other (See Comments)   Toradol, etc And related- hallucinations   Toradol [ketorolac Tromethamine] Other (See Comments)   hallucinations   Vistaril [hydroxyzine Hcl] Other (See Comments)   unknown   Erythromycin Itching, Rash   Morphine And Related Hives, Rash   Penicillins Hives, Itching, Rash   Has patient had a PCN reaction causing immediate rash, facial/tongue/throat swelling, SOB or lightheadedness with hypotension: yes Has patient had a PCN reaction causing severe rash involving mucus membranes or skin necrosis: unknown Has patient had a PCN reaction that required hospitalization : unknown Has patient had a PCN reaction occurring within the last 10  years: no If all of the above answers are "NO", then may proceed with Cephalosporin use.      Medication List    STOP taking these medications   cephALEXin 500 MG capsule Commonly known as:  KEFLEX   clindamycin 150 MG capsule Commonly known as:  CLEOCIN   CRANBERRY PO   doxycycline 100 MG capsule Commonly known as:  VIBRAMYCIN   nitrofurantoin (macrocrystal-monohydrate) 100 MG capsule Commonly known as:  MACROBID   omeprazole 20 MG capsule Commonly known as:  PRILOSEC   traMADol 50 MG tablet Commonly known as:  ULTRAM     TAKE these medications   ALPRAZolam 0.5 MG tablet Commonly known as:  XANAX TAKE 1 TABLET BY MOUTH 4 TIMES A DAY AS NEEDED FOR ANXIETY What changed:  Another medication with the same name was removed. Continue taking this medication, and follow the directions you see here.   atenolol 50 MG tablet Commonly known as:  TENORMIN Take 50 mg by mouth 2 (two) times daily.   ciprofloxacin 500 MG tablet Commonly known as:  CIPRO Take 1 tablet (500 mg total) 2 (two) times daily by mouth.   divalproex 500 MG DR tablet Commonly known as:  DEPAKOTE Take 500 mg by mouth at bedtime.   EMBEDA 20-0.8 MG Cpcr Generic drug:  Morphine-Naltrexone Take 1 tablet by mouth daily.   insulin glargine 100 UNIT/ML injection Commonly known as:  LANTUS Inject 30 Units into the skin daily.   latanoprost 0.005 % ophthalmic solution Commonly known as:  XALATAN Place 1 drop into both eyes at bedtime.   levothyroxine 200 MCG tablet Commonly known as:  SYNTHROID, LEVOTHROID Take 200 mcg by mouth at bedtime.   lisinopril 10 MG tablet Commonly known as:  PRINIVIL,ZESTRIL Take 10 mg by mouth at bedtime.   miconazole 100 MG vaginal suppository Commonly known as:  MICOTIN Place 100 mg vaginally as needed (Yeast infection). What changed:  Another medication with the same name was removed. Continue taking this medication, and follow the directions you see here.     ondansetron 8 MG disintegrating tablet Commonly known as:  ZOFRAN ODT Take 1 tablet (8 mg total) by mouth every 8 (eight) hours as needed for nausea or vomiting.   oxyCODONE-acetaminophen 10-325 MG tablet Commonly known as:  PERCOCET Take 1 tablet by mouth every 6 (six) hours  as needed for pain. What changed:  Another medication with the same name was removed. Continue taking this medication, and follow the directions you see here.   pregabalin 75 MG capsule Commonly known as:  LYRICA Take 1 capsule (75 mg total) 2 (two) times daily by mouth.   promethazine 25 MG suppository Commonly known as:  PHENERGAN Place 1 suppository (25 mg total) rectally every 6 (six) hours as needed for nausea or vomiting. What changed:  Another medication with the same name was removed. Continue taking this medication, and follow the directions you see here.   sucralfate 1 GM/10ML suspension Commonly known as:  CARAFATE Take 10 mLs (1 g total) by mouth 4 (four) times daily -  with meals and at bedtime.       Major procedures and Radiology Reports - PLEASE review detailed and final reports for all details, in brief -      Ct Renal Stone Study  Result Date: 11/22/2016 CLINICAL DATA:  Abdominal pain, vomiting and diarrhea x3 days. History of breast cancer diagnosed in 2013 with bilateral mastectomies in 2014. Hysterectomy, cholecystectomy and appendectomy. EXAM: CT ABDOMEN AND PELVIS WITHOUT CONTRAST TECHNIQUE: Multidetector CT imaging of the abdomen and pelvis was performed following the standard protocol without IV contrast. COMPARISON:  11/23/2015 FINDINGS: Lower chest: Bilateral partially included saline breast implants. The included heart is top-normal in size with coronary arteriosclerosis along the RCA. No pericardial effusion. Hepatobiliary: Cholecystectomy. No biliary dilatation. No mass noted of the liver given limitations of a noncontrast study. Pancreas: Pancreatic atrophy without ductal  dilatation or definite mass. Spleen: No splenomegaly or mass. Adrenals/Urinary Tract: Normal bilateral adrenal glands. Pelvic midline left kidney. The cyst in the lower pole of the pelvic kidney is not well visualized given lack of IV contrast. There is a 15 mm interpolar right renal cyst unchanged. No nephrolithiasis or obstructive uropathy. Physiologic distention of the urinary bladder without focal mural thickening or calculus identified. Stomach/Bowel: The stomach is not distended in appearance and contains ingested food. There is normal small bowel rotation without obstruction or inflammation. The patient is status post appendectomy by report. No large bowel obstruction or significant stool burden. Vascular/Lymphatic: Moderate aortoiliac and branch vessel atherosclerosis without aneurysm. No lymphadenopathy. Reproductive: Uterus and bilateral adnexa are unremarkable. Other: No abdominal wall hernia or abnormality. No abdominopelvic ascites. Musculoskeletal: Chronic ossification adjacent to the right femoral head. No acute nor suspicious osseous abnormalities. IMPRESSION: 1. No acute bowel obstruction or inflammation. 2. Pelvic midline left kidney with stable right renal cyst noted. Left-sided renal cyst is not well visualized on this unenhanced exam. Electronically Signed   By: Ashley Royalty M.D.   On: 11/22/2016 23:35    Micro Results     No results found for this or any previous visit (from the past 240 hour(s)).     Today   Subjective    Andrea [name] today has no fever or chills.  No abdominal pain.  No nausea or vomiting.  No diarrhea   Patient has been seen and examined prior to discharge   Objective   Blood pressure (!) 150/68, pulse 80, temperature 98.4 F (36.9 C), temperature source Oral, resp. rate 16, height 5\' 5"  (1.651 m), weight 70.8 kg (156 lb 1.4 oz), SpO2 98 %.   Intake/Output Summary (Last 24 hours) at 11/24/2016 1707 Last data filed at 11/24/2016 1501 Gross  per 24 hour  Intake 1666.25 ml  Output -  Net 1666.25 ml    Exam Gen:- Awake no  acute distress HEENT:- [place].AT,   Neck-Supple Neck,No JVD,  Lungs-clear to auscultation bilaterally CV- S1, S2 normal Abd-  +ve B.Sounds, Abd Soft, No tenderness,    Extremity/Skin:- Intact peripheral pulses     Data Review   CBC w Diff:  Lab Results  Component Value Date   WBC 8.8 11/24/2016   HGB 12.4 11/24/2016   HCT 35.7 (L) 11/24/2016   PLT 247 11/24/2016   LYMPHOPCT 48 11/22/2016   MONOPCT 8 11/22/2016   EOSPCT 2 11/22/2016   BASOPCT 0 11/22/2016    CMP:  Lab Results  Component Value Date   NA 135 11/24/2016   K 4.1 11/24/2016   CL 100 (L) 11/24/2016   CO2 25 11/24/2016   BUN 11 11/24/2016   CREATININE 0.96 11/24/2016   PROT 7.8 11/23/2016   ALBUMIN 3.8 11/23/2016   BILITOT 0.7 11/23/2016   ALKPHOS 70 11/23/2016   AST 25 11/23/2016   ALT 20 11/23/2016  .   Total Discharge time is about 33 minutes  OSEI-BONSU,Takasha Vetere M.D on 11/24/2016 at 5:07 PM  [facility]   Office  [phone]  Dragon dictation system was used to create this note, attempts have been made to correct errors, however presence of uncorrected errors is not a reflection quality of care provided

## 2016-11-25 LAB — GLUCOSE, CAPILLARY: GLUCOSE-CAPILLARY: 182 mg/dL — AB (ref 65–99)

## 2017-01-07 ENCOUNTER — Encounter (HOSPITAL_COMMUNITY): Payer: Self-pay

## 2017-01-07 ENCOUNTER — Inpatient Hospital Stay (HOSPITAL_COMMUNITY)
Admission: EM | Admit: 2017-01-07 | Discharge: 2017-01-12 | DRG: 565 | Disposition: A | Discharge status: Home / Self Care | Payer: Medicare Other | Attending: Internal Medicine | Admitting: Internal Medicine

## 2017-01-07 ENCOUNTER — Emergency Department (HOSPITAL_COMMUNITY): Payer: Medicare Other

## 2017-01-07 DIAGNOSIS — E039 Hypothyroidism, unspecified: Secondary | ICD-10-CM | POA: Diagnosis present

## 2017-01-07 DIAGNOSIS — Z87898 Personal history of other specified conditions: Secondary | ICD-10-CM

## 2017-01-07 DIAGNOSIS — Z9181 History of falling: Secondary | ICD-10-CM

## 2017-01-07 DIAGNOSIS — T796XXA Traumatic ischemia of muscle, initial encounter: Secondary | ICD-10-CM | POA: Diagnosis not present

## 2017-01-07 DIAGNOSIS — Z886 Allergy status to analgesic agent status: Secondary | ICD-10-CM

## 2017-01-07 DIAGNOSIS — Z888 Allergy status to other drugs, medicaments and biological substances status: Secondary | ICD-10-CM

## 2017-01-07 DIAGNOSIS — Z853 Personal history of malignant neoplasm of breast: Secondary | ICD-10-CM

## 2017-01-07 DIAGNOSIS — E860 Dehydration: Secondary | ICD-10-CM | POA: Diagnosis present

## 2017-01-07 DIAGNOSIS — Z79891 Long term (current) use of opiate analgesic: Secondary | ICD-10-CM

## 2017-01-07 DIAGNOSIS — K219 Gastro-esophageal reflux disease without esophagitis: Secondary | ICD-10-CM | POA: Diagnosis present

## 2017-01-07 DIAGNOSIS — Z23 Encounter for immunization: Secondary | ICD-10-CM

## 2017-01-07 DIAGNOSIS — N3000 Acute cystitis without hematuria: Secondary | ICD-10-CM | POA: Diagnosis present

## 2017-01-07 DIAGNOSIS — I10 Essential (primary) hypertension: Secondary | ICD-10-CM | POA: Diagnosis present

## 2017-01-07 DIAGNOSIS — Z885 Allergy status to narcotic agent status: Secondary | ICD-10-CM

## 2017-01-07 DIAGNOSIS — E1165 Type 2 diabetes mellitus with hyperglycemia: Secondary | ICD-10-CM | POA: Diagnosis present

## 2017-01-07 DIAGNOSIS — Z1612 Extended spectrum beta lactamase (ESBL) resistance: Secondary | ICD-10-CM | POA: Diagnosis present

## 2017-01-07 DIAGNOSIS — R531 Weakness: Secondary | ICD-10-CM

## 2017-01-07 DIAGNOSIS — N289 Disorder of kidney and ureter, unspecified: Secondary | ICD-10-CM | POA: Diagnosis present

## 2017-01-07 DIAGNOSIS — M6282 Rhabdomyolysis: Secondary | ICD-10-CM

## 2017-01-07 DIAGNOSIS — Z880 Allergy status to penicillin: Secondary | ICD-10-CM

## 2017-01-07 DIAGNOSIS — Z794 Long term (current) use of insulin: Secondary | ICD-10-CM

## 2017-01-07 DIAGNOSIS — Z79899 Other long term (current) drug therapy: Secondary | ICD-10-CM

## 2017-01-07 DIAGNOSIS — W19XXXA Unspecified fall, initial encounter: Secondary | ICD-10-CM | POA: Diagnosis present

## 2017-01-07 DIAGNOSIS — G8929 Other chronic pain: Secondary | ICD-10-CM | POA: Diagnosis present

## 2017-01-07 DIAGNOSIS — R296 Repeated falls: Secondary | ICD-10-CM | POA: Diagnosis present

## 2017-01-07 DIAGNOSIS — G40909 Epilepsy, unspecified, not intractable, without status epilepticus: Secondary | ICD-10-CM | POA: Diagnosis present

## 2017-01-07 LAB — BASIC METABOLIC PANEL
ANION GAP: 12 (ref 5–15)
BUN: 19 mg/dL (ref 6–20)
CHLORIDE: 97 mmol/L — AB (ref 101–111)
CO2: 25 mmol/L (ref 22–32)
Calcium: 9.2 mg/dL (ref 8.9–10.3)
Creatinine, Ser: 0.96 mg/dL (ref 0.44–1.00)
GFR calc non Af Amer: >60 mL/min (ref >60)
Glucose, Bld: 417 mg/dL — ABNORMAL HIGH (ref 65–99)
POTASSIUM: 4 mmol/L (ref 3.5–5.1)
Sodium: 134 mmol/L — ABNORMAL LOW (ref 135–145)

## 2017-01-07 LAB — CBC
HEMATOCRIT: 39.9 % (ref 36.0–46.0)
HEMOGLOBIN: 13.7 g/dL (ref 12.0–15.0)
MCH: 30.9 pg (ref 26.0–34.0)
MCHC: 34.3 g/dL (ref 30.0–36.0)
MCV: 89.9 fL (ref 78.0–100.0)
Platelets: 315 10*3/uL (ref 150–400)
RBC: 4.44 MIL/uL (ref 3.87–5.11)
RDW: 13.5 % (ref 11.5–15.5)
WBC: 12.3 10*3/uL — ABNORMAL HIGH (ref 4.0–10.5)

## 2017-01-07 LAB — CBG MONITORING, ED: GLUCOSE-CAPILLARY: 365 mg/dL — AB (ref 65–99)

## 2017-01-07 MED ORDER — HYDROCODONE-ACETAMINOPHEN 5-325 MG PO TABS
2.0000 | ORAL_TABLET | Freq: Once | ORAL | Status: AC
  Administered 2017-01-07: 2 via ORAL
  Filled 2017-01-07: qty 2

## 2017-01-07 MED ORDER — SODIUM CHLORIDE 0.9 % IV BOLUS (SEPSIS)
1000.0000 mL | Freq: Once | INTRAVENOUS | Status: AC
  Administered 2017-01-08: 1000 mL via INTRAVENOUS

## 2017-01-07 NOTE — ED Notes (Signed)
Pt reports she has been falling everyday d/t weakness.  She also mentioned going to the pain clinic on Wednesday for her low back and R hip pain.  She is requesting for the ED provider to re-start her pain meds that she used to take.

## 2017-01-07 NOTE — ED Notes (Signed)
Patient attempting to get up in lobby.  Wearing gown from xray, gown exposing patient.  Provided with blanket to cover her up, and assisted patient to bench.  Asked patient not to get up without staff assistance.  Verbalized understanding.

## 2017-01-07 NOTE — ED Triage Notes (Signed)
Pt from home. Fell last night between toilet and bathtub and has been having pain to right hip. Pt reports frequent falls. Pt reports tremors and stuttering speech that began today. Pt ambulated to stretcher. Pt cbg 527  134/90 Hr 100

## 2017-01-07 NOTE — ED Notes (Signed)
Pt out for xray

## 2017-01-07 NOTE — ED Notes (Signed)
Pt's CBG result was 365. Informed Stage manager.

## 2017-01-07 NOTE — ED Notes (Signed)
Patient transported to X-ray 

## 2017-01-08 ENCOUNTER — Inpatient Hospital Stay (HOSPITAL_COMMUNITY): Payer: Medicare Other

## 2017-01-08 ENCOUNTER — Encounter (HOSPITAL_COMMUNITY): Payer: Self-pay | Admitting: Family Medicine

## 2017-01-08 ENCOUNTER — Other Ambulatory Visit: Payer: Self-pay

## 2017-01-08 DIAGNOSIS — R296 Repeated falls: Secondary | ICD-10-CM | POA: Diagnosis present

## 2017-01-08 DIAGNOSIS — Z79891 Long term (current) use of opiate analgesic: Secondary | ICD-10-CM | POA: Diagnosis not present

## 2017-01-08 DIAGNOSIS — Z885 Allergy status to narcotic agent status: Secondary | ICD-10-CM | POA: Diagnosis not present

## 2017-01-08 DIAGNOSIS — Z23 Encounter for immunization: Secondary | ICD-10-CM | POA: Diagnosis present

## 2017-01-08 DIAGNOSIS — M6282 Rhabdomyolysis: Secondary | ICD-10-CM

## 2017-01-08 DIAGNOSIS — E039 Hypothyroidism, unspecified: Secondary | ICD-10-CM

## 2017-01-08 DIAGNOSIS — I10 Essential (primary) hypertension: Secondary | ICD-10-CM | POA: Diagnosis present

## 2017-01-08 DIAGNOSIS — Z79899 Other long term (current) drug therapy: Secondary | ICD-10-CM | POA: Diagnosis not present

## 2017-01-08 DIAGNOSIS — Z794 Long term (current) use of insulin: Secondary | ICD-10-CM | POA: Diagnosis not present

## 2017-01-08 DIAGNOSIS — Z853 Personal history of malignant neoplasm of breast: Secondary | ICD-10-CM | POA: Diagnosis not present

## 2017-01-08 DIAGNOSIS — G8929 Other chronic pain: Secondary | ICD-10-CM | POA: Diagnosis present

## 2017-01-08 DIAGNOSIS — T796XXA Traumatic ischemia of muscle, initial encounter: Secondary | ICD-10-CM | POA: Diagnosis present

## 2017-01-08 DIAGNOSIS — Z9181 History of falling: Secondary | ICD-10-CM | POA: Diagnosis not present

## 2017-01-08 DIAGNOSIS — Z1612 Extended spectrum beta lactamase (ESBL) resistance: Secondary | ICD-10-CM | POA: Diagnosis present

## 2017-01-08 DIAGNOSIS — G40909 Epilepsy, unspecified, not intractable, without status epilepticus: Secondary | ICD-10-CM

## 2017-01-08 DIAGNOSIS — Z888 Allergy status to other drugs, medicaments and biological substances status: Secondary | ICD-10-CM | POA: Diagnosis not present

## 2017-01-08 DIAGNOSIS — N3000 Acute cystitis without hematuria: Secondary | ICD-10-CM | POA: Diagnosis not present

## 2017-01-08 DIAGNOSIS — E1165 Type 2 diabetes mellitus with hyperglycemia: Secondary | ICD-10-CM | POA: Diagnosis present

## 2017-01-08 DIAGNOSIS — K219 Gastro-esophageal reflux disease without esophagitis: Secondary | ICD-10-CM | POA: Diagnosis present

## 2017-01-08 DIAGNOSIS — E860 Dehydration: Secondary | ICD-10-CM | POA: Diagnosis present

## 2017-01-08 DIAGNOSIS — Z886 Allergy status to analgesic agent status: Secondary | ICD-10-CM | POA: Diagnosis not present

## 2017-01-08 DIAGNOSIS — R531 Weakness: Secondary | ICD-10-CM | POA: Diagnosis not present

## 2017-01-08 DIAGNOSIS — W19XXXA Unspecified fall, initial encounter: Secondary | ICD-10-CM | POA: Diagnosis not present

## 2017-01-08 DIAGNOSIS — Z880 Allergy status to penicillin: Secondary | ICD-10-CM | POA: Diagnosis not present

## 2017-01-08 LAB — URINALYSIS, ROUTINE W REFLEX MICROSCOPIC
BILIRUBIN URINE: NEGATIVE
Glucose, UA: >=500 mg/dL — AB
Ketones, ur: NEGATIVE mg/dL
Nitrite: NEGATIVE
PH: 5 (ref 5.0–8.0)
Protein, ur: 100 mg/dL — AB
SPECIFIC GRAVITY, URINE: 1.024 (ref 1.005–1.030)
SQUAMOUS EPITHELIAL / LPF: NONE SEEN

## 2017-01-08 LAB — CBC WITH DIFFERENTIAL/PLATELET
BASOS ABS: 0 10*3/uL (ref 0.0–0.1)
Basophils Relative: 0 %
Eosinophils Absolute: 0.2 10*3/uL (ref 0.0–0.7)
Eosinophils Relative: 2 %
HEMATOCRIT: 35.4 % — AB (ref 36.0–46.0)
Hemoglobin: 12.1 g/dL (ref 12.0–15.0)
LYMPHS PCT: 34 %
Lymphs Abs: 4 10*3/uL (ref 0.7–4.0)
MCH: 30.6 pg (ref 26.0–34.0)
MCHC: 34.2 g/dL (ref 30.0–36.0)
MCV: 89.4 fL (ref 78.0–100.0)
Monocytes Absolute: 1.1 10*3/uL — ABNORMAL HIGH (ref 0.1–1.0)
Monocytes Relative: 9 %
NEUTROS ABS: 6.6 10*3/uL (ref 1.7–7.7)
NEUTROS PCT: 55 %
Platelets: 293 10*3/uL (ref 150–400)
RBC: 3.96 MIL/uL (ref 3.87–5.11)
RDW: 13.2 % (ref 11.5–15.5)
WBC: 11.9 10*3/uL — AB (ref 4.0–10.5)

## 2017-01-08 LAB — LACTATE DEHYDROGENASE: LDH: 194 U/L — AB (ref 98–192)

## 2017-01-08 LAB — COMPREHENSIVE METABOLIC PANEL
ALBUMIN: 2.8 g/dL — AB (ref 3.5–5.0)
ALT: 21 U/L (ref 14–54)
AST: 36 U/L (ref 15–41)
Alkaline Phosphatase: 66 U/L (ref 38–126)
Anion gap: 9 (ref 5–15)
BILIRUBIN TOTAL: 0.7 mg/dL (ref 0.3–1.2)
BUN: 19 mg/dL (ref 6–20)
CO2: 25 mmol/L (ref 22–32)
Calcium: 8.2 mg/dL — ABNORMAL LOW (ref 8.9–10.3)
Chloride: 101 mmol/L (ref 101–111)
Creatinine, Ser: 0.87 mg/dL (ref 0.44–1.00)
GLUCOSE: 291 mg/dL — AB (ref 65–99)
POTASSIUM: 3.6 mmol/L (ref 3.5–5.1)
Sodium: 135 mmol/L (ref 135–145)
TOTAL PROTEIN: 6 g/dL — AB (ref 6.5–8.1)

## 2017-01-08 LAB — VALPROIC ACID LEVEL: Valproic Acid Lvl: 80 ug/mL (ref 50.0–100.0)

## 2017-01-08 LAB — CBG MONITORING, ED
GLUCOSE-CAPILLARY: 223 mg/dL — AB (ref 65–99)
Glucose-Capillary: 231 mg/dL — ABNORMAL HIGH (ref 65–99)

## 2017-01-08 LAB — GLUCOSE, CAPILLARY
GLUCOSE-CAPILLARY: 183 mg/dL — AB (ref 65–99)
GLUCOSE-CAPILLARY: 239 mg/dL — AB (ref 65–99)

## 2017-01-08 LAB — CK
CK TOTAL: 1457 U/L — AB (ref 38–234)
Total CK: 855 U/L — ABNORMAL HIGH (ref 38–234)

## 2017-01-08 LAB — TSH: TSH: 26.318 u[IU]/mL — ABNORMAL HIGH (ref 0.350–4.500)

## 2017-01-08 MED ORDER — ENOXAPARIN SODIUM 40 MG/0.4ML ~~LOC~~ SOLN
40.0000 mg | SUBCUTANEOUS | Status: DC
  Administered 2017-01-08 – 2017-01-11 (×4): 40 mg via SUBCUTANEOUS
  Filled 2017-01-08 (×5): qty 0.4

## 2017-01-08 MED ORDER — LEVOTHYROXINE SODIUM 200 MCG PO TABS
200.0000 ug | ORAL_TABLET | Freq: Every day | ORAL | Status: DC
  Administered 2017-01-08 – 2017-01-11 (×4): 200 ug via ORAL
  Filled 2017-01-08: qty 2
  Filled 2017-01-08: qty 1
  Filled 2017-01-08: qty 2
  Filled 2017-01-08 (×2): qty 1
  Filled 2017-01-08: qty 2
  Filled 2017-01-08: qty 1

## 2017-01-08 MED ORDER — INFLUENZA VAC SPLIT HIGH-DOSE 0.5 ML IM SUSY
0.5000 mL | PREFILLED_SYRINGE | INTRAMUSCULAR | Status: AC
  Administered 2017-01-11: 0.5 mL via INTRAMUSCULAR
  Filled 2017-01-08: qty 0.5

## 2017-01-08 MED ORDER — INSULIN ASPART 100 UNIT/ML ~~LOC~~ SOLN
0.0000 [IU] | Freq: Three times a day (TID) | SUBCUTANEOUS | Status: DC
  Administered 2017-01-08 (×2): 5 [IU] via SUBCUTANEOUS
  Administered 2017-01-08 – 2017-01-09 (×2): 3 [IU] via SUBCUTANEOUS
  Administered 2017-01-09: 8 [IU] via SUBCUTANEOUS
  Administered 2017-01-09: 5 [IU] via SUBCUTANEOUS
  Administered 2017-01-10: 3 [IU] via SUBCUTANEOUS
  Administered 2017-01-10: 8 [IU] via SUBCUTANEOUS
  Administered 2017-01-10: 3 [IU] via SUBCUTANEOUS
  Administered 2017-01-11: 2 [IU] via SUBCUTANEOUS
  Administered 2017-01-11 (×2): 5 [IU] via SUBCUTANEOUS
  Administered 2017-01-12: 2 [IU] via SUBCUTANEOUS
  Filled 2017-01-08 (×2): qty 1

## 2017-01-08 MED ORDER — PNEUMOCOCCAL VAC POLYVALENT 25 MCG/0.5ML IJ INJ
0.5000 mL | INJECTION | INTRAMUSCULAR | Status: AC
  Administered 2017-01-11: 0.5 mL via INTRAMUSCULAR
  Filled 2017-01-08: qty 0.5

## 2017-01-08 MED ORDER — HYDROCODONE-ACETAMINOPHEN 5-325 MG PO TABS
1.0000 | ORAL_TABLET | ORAL | Status: DC | PRN
Start: 2017-01-08 — End: 2017-01-08
  Administered 2017-01-08 (×2): 2 via ORAL
  Filled 2017-01-08 (×2): qty 2

## 2017-01-08 MED ORDER — CIPROFLOXACIN IN D5W 400 MG/200ML IV SOLN
400.0000 mg | Freq: Two times a day (BID) | INTRAVENOUS | Status: DC
  Administered 2017-01-08 – 2017-01-09 (×2): 400 mg via INTRAVENOUS
  Filled 2017-01-08 (×4): qty 200

## 2017-01-08 MED ORDER — POTASSIUM CHLORIDE IN NACL 20-0.9 MEQ/L-% IV SOLN
INTRAVENOUS | Status: DC
  Administered 2017-01-08 – 2017-01-09 (×2): via INTRAVENOUS
  Filled 2017-01-08 (×3): qty 1000

## 2017-01-08 MED ORDER — CIPROFLOXACIN IN D5W 400 MG/200ML IV SOLN: 400.0000 mg | Freq: Two times a day (BID) | INTRAVENOUS | Status: DC

## 2017-01-08 MED ORDER — SODIUM CHLORIDE 0.9 % IV SOLN
1.0000 g | Freq: Three times a day (TID) | INTRAVENOUS | Status: DC
  Administered 2017-01-08 (×2): 1 g via INTRAVENOUS
  Filled 2017-01-08 (×3): qty 1

## 2017-01-08 MED ORDER — SODIUM CHLORIDE 0.9 % IV BOLUS (SEPSIS)
1000.0000 mL | Freq: Once | INTRAVENOUS | Status: AC
  Administered 2017-01-08: 1000 mL via INTRAVENOUS

## 2017-01-08 MED ORDER — SODIUM CHLORIDE 0.9 % IV SOLN
INTRAVENOUS | Status: DC
  Administered 2017-01-08: 05:00:00 via INTRAVENOUS

## 2017-01-08 MED ORDER — LISINOPRIL 10 MG PO TABS
10.0000 mg | ORAL_TABLET | Freq: Every day | ORAL | Status: DC
  Administered 2017-01-08 – 2017-01-11 (×4): 10 mg via ORAL
  Filled 2017-01-08 (×4): qty 1

## 2017-01-08 MED ORDER — MORPHINE-NALTREXONE 20-0.8 MG PO CPCR: 1.0000 | ORAL_CAPSULE | Freq: Every day | ORAL | Status: DC

## 2017-01-08 MED ORDER — ONDANSETRON HCL 4 MG PO TABS: 4.0000 mg | ORAL_TABLET | Freq: Four times a day (QID) | ORAL | Status: DC | PRN

## 2017-01-08 MED ORDER — ENSURE ENLIVE PO LIQD: 237.0000 mL | Freq: Two times a day (BID) | ORAL | Status: DC

## 2017-01-08 MED ORDER — INSULIN ASPART 100 UNIT/ML ~~LOC~~ SOLN
0.0000 [IU] | Freq: Every day | SUBCUTANEOUS | Status: DC
  Administered 2017-01-08 – 2017-01-10 (×3): 2 [IU] via SUBCUTANEOUS

## 2017-01-08 MED ORDER — DIVALPROEX SODIUM 500 MG PO DR TAB
500.0000 mg | DELAYED_RELEASE_TABLET | Freq: Every day | ORAL | Status: DC
  Administered 2017-01-08 – 2017-01-11 (×4): 500 mg via ORAL
  Filled 2017-01-08 (×4): qty 1

## 2017-01-08 MED ORDER — ALPRAZOLAM 0.5 MG PO TABS
0.5000 mg | ORAL_TABLET | Freq: Three times a day (TID) | ORAL | Status: DC | PRN
  Administered 2017-01-08 – 2017-01-11 (×5): 0.5 mg via ORAL
  Filled 2017-01-08 (×5): qty 1

## 2017-01-08 MED ORDER — ONDANSETRON HCL 4 MG/2ML IJ SOLN
4.0000 mg | Freq: Four times a day (QID) | INTRAMUSCULAR | Status: DC | PRN
  Administered 2017-01-09 – 2017-01-12 (×2): 4 mg via INTRAVENOUS
  Filled 2017-01-08 (×2): qty 2

## 2017-01-08 MED ORDER — LATANOPROST 0.005 % OP SOLN
1.0000 [drp] | Freq: Every day | OPHTHALMIC | Status: DC
  Administered 2017-01-08 – 2017-01-11 (×4): 1 [drp] via OPHTHALMIC
  Filled 2017-01-08: qty 2.5

## 2017-01-08 MED ORDER — CIPROFLOXACIN HCL 500 MG PO TABS: 500.0000 mg | ORAL_TABLET | Freq: Once | ORAL | Status: DC

## 2017-01-08 MED ORDER — CIPROFLOXACIN IN D5W 400 MG/200ML IV SOLN
400.0000 mg | Freq: Once | INTRAVENOUS | Status: AC
  Administered 2017-01-08: 400 mg via INTRAVENOUS
  Filled 2017-01-08: qty 200

## 2017-01-08 MED ORDER — MORPHINE SULFATE ER 15 MG PO TBCR
15.0000 mg | EXTENDED_RELEASE_TABLET | Freq: Every day | ORAL | Status: DC
  Administered 2017-01-08 – 2017-01-12 (×5): 15 mg via ORAL
  Filled 2017-01-08 (×5): qty 1

## 2017-01-08 MED ORDER — ACETAMINOPHEN 650 MG RE SUPP: 650.0000 mg | Freq: Four times a day (QID) | RECTAL | Status: DC | PRN

## 2017-01-08 MED ORDER — ATENOLOL 50 MG PO TABS
50.0000 mg | ORAL_TABLET | Freq: Two times a day (BID) | ORAL | Status: DC
  Administered 2017-01-08 – 2017-01-12 (×9): 50 mg via ORAL
  Filled 2017-01-08: qty 1
  Filled 2017-01-08: qty 2
  Filled 2017-01-08 (×7): qty 1

## 2017-01-08 MED ORDER — ACETAMINOPHEN 325 MG PO TABS
650.0000 mg | ORAL_TABLET | Freq: Four times a day (QID) | ORAL | Status: DC | PRN
  Administered 2017-01-08 – 2017-01-10 (×2): 650 mg via ORAL
  Filled 2017-01-08 (×2): qty 2

## 2017-01-08 MED ORDER — INSULIN GLARGINE 100 UNIT/ML ~~LOC~~ SOLN
30.0000 [IU] | Freq: Every day | SUBCUTANEOUS | Status: DC
  Administered 2017-01-08 – 2017-01-11 (×4): 30 [IU] via SUBCUTANEOUS
  Filled 2017-01-08 (×5): qty 0.3

## 2017-01-08 MED ORDER — PREGABALIN 75 MG PO CAPS
75.0000 mg | ORAL_CAPSULE | Freq: Two times a day (BID) | ORAL | Status: DC
  Administered 2017-01-08 – 2017-01-12 (×9): 75 mg via ORAL
  Filled 2017-01-08 (×9): qty 1

## 2017-01-08 NOTE — Progress Notes (Signed)
Pharmacy Antibiotic Note  Andrea [name] is a 66 y.o. female admitted on 01/07/2017 with UTI.  Pharmacy has been consulted for Merrem dosing. Here with fall/possible UTI. Hx ESBL UTI. WBC 12.3. Renal function good.   Plan: Merrem 1g IV q8h Trend WBC, temp, renal function  F/U infectious work-up F/U urine culture    Height: 5\' 5"  (165.1 cm) Weight: 144 lb (65.3 kg) IBW/kg (Calculated) : 57  Temp (24hrs), Avg:98.6 F (37 C), Min:98.6 F (37 C), Max:98.6 F (37 C)  Recent Labs  Lab 01/07/17 1656  WBC 12.3*  CREATININE 0.96    Estimated Creatinine Clearance: 52.6 mL/min (by C-G formula based on SCr of 0.96 mg/dL).    Allergies  Allergen Reactions  . Aspirin Nausea And Vomiting  . Maxzide [Hydrochlorothiazide W-Triamterene] Swelling    Fluid retention  . Metformin And Related Diarrhea  . Nsaids Nausea And Vomiting  . Other Other (See Comments)    All steroids produce psychosis per pt Artificial sweeteners produce nausea and upset stomach.  Gregary Cromer [Pravastatin] Other (See Comments)    unknown  . Stadol [Butorphanol] Other (See Comments)    Toradol, etc And related- hallucinations  . Toradol [Ketorolac Tromethamine] Other (See Comments)    hallucinations  . Vistaril [Hydroxyzine Hcl] Other (See Comments)    unknown  . Erythromycin Itching and Rash  . Morphine And Related Hives and Rash  . Penicillins Hives, Itching and Rash    Has patient had a PCN reaction causing immediate rash, facial/tongue/throat swelling, SOB or lightheadedness with hypotension: yes Has patient had a PCN reaction causing severe rash involving mucus membranes or skin necrosis: unknown Has patient had a PCN reaction that required hospitalization : unknown Has patient had a PCN reaction occurring within the last 10 years: no If all of the above answers are "NO", then may proceed with Cephalosporin use.     Andrea [name] 01/08/2017 4:24 AM

## 2017-01-08 NOTE — ED Provider Notes (Signed)
[facility] EMERGENCY DEPARTMENT Provider Note   CSN: [id number] Arrival date & time: 01/07/17  [place]     History   Chief Complaint Chief Complaint  Patient presents with  . Fall  . Hyperglycemia    HPI Andrea [name] is a 66 y.o. female.  The history is provided by the patient.  Fall  This is a new problem. The problem occurs daily. The problem has been gradually worsening. Pertinent negatives include no chest pain, no abdominal pain, no headaches and no shortness of breath. The symptoms are aggravated by walking. The symptoms are relieved by rest.  Hyperglycemia  Associated symptoms: fatigue   Associated symptoms: no abdominal pain, no chest pain, no dysuria, no fever and no shortness of breath   Patient presents emergency department for frequent falls over the past several days. She reports she keeps falling, but denies any loss of consciousness. Reports generalized fatigue but no focal weakness She reports that yesterday she was lodged between the toilet and the wall for several hours and was finally able to get up on her own She reports she fell again today, she may have injured her back She has a history of chronic pain is on multiple narcotics  There is also mention per nursing notes that she was having tremors and stuttering She tells me she has been stuttering for over a month   Past Medical History:  Diagnosis Date  . Breast cancer ([place])   . Diabetes mellitus without complication ([place])   . Esophageal reflux   . Hypertension   . Hypothyroid   . Migraine   . Seizure [facility])     Patient Active Problem List   Diagnosis Date Noted  . Nausea & vomiting 11/23/2016  . Diarrhea 11/23/2016  . UTI (urinary tract infection) 12/03/2013  . Fall 12/02/2013  . Hypertension   . Breast cancer ([place])   . Diabetes mellitus without complication ([place])   . Migraine   . Hypothyroid   . Epilepsy ([place])   . Esophageal reflux   . Multiple rib fractures  11/28/2013    Past Surgical History:  Procedure Laterality Date  . ABDOMINAL HYSTERECTOMY    . APPENDECTOMY    . CHOLECYSTECTOMY    . EYE SURGERY  08/28/2015   Right eye  . KNEE SURGERY    . MASTECTOMY      OB History    No data available       Home Medications    Prior to Admission medications   Medication Sig Start Date End Date Taking? Authorizing Provider  ALPRAZolam (XANAX) 0.5 MG tablet TAKE 1 TABLET BY MOUTH 4 TIMES A DAY AS NEEDED FOR ANXIETY 11/21/16  Yes [provider]  atenolol (TENORMIN) 50 MG tablet Take 50 mg by mouth 2 (two) times daily.    Yes [provider]  divalproex (DEPAKOTE) 500 MG DR tablet Take 500 mg by mouth at bedtime.    Yes [provider]  EMBEDA 20-0.8 MG CPCR Take 1 tablet by mouth daily. 10/24/16  Yes [provider]  insulin glargine (LANTUS) 100 UNIT/ML injection Inject 30 Units into the skin daily.    Yes [provider]  latanoprost (XALATAN) 0.005 % ophthalmic solution Place 1 drop into both eyes at bedtime.    Yes [provider]  levothyroxine (SYNTHROID, LEVOTHROID) 200 MCG tablet Take 200 mcg by mouth at bedtime.  10/02/15  Yes [provider]  lisinopril (PRINIVIL,ZESTRIL) 10 MG tablet Take 10 mg by  mouth at bedtime.    Yes [provider]  miconazole (MICOTIN) 100 MG vaginal suppository Place 100 mg vaginally as needed (Yeast infection).   Yes [provider]  ondansetron (ZOFRAN) 4 MG/2ML SOLN injection Inject 2 mLs (4 mg total) every 6 (six) hours as needed into the vein for nausea or vomiting. 11/24/16  Yes Osei-Bonsu, Iona Beard, MD  pregabalin (LYRICA) 75 MG capsule Take 1 capsule (75 mg total) 2 (two) times daily by mouth. 11/24/16  Yes Osei-Bonsu, Iona Beard, MD  promethazine (PHENERGAN) 25 MG suppository Place 1 suppository (25 mg total) rectally every 6 (six) hours as needed for nausea or vomiting. 11/30/15  Yes Law, Bea Graff, PA-C  ciprofloxacin (CIPRO) 500  MG tablet Take 1 tablet (500 mg total) 2 (two) times daily by mouth. Patient not taking: Reported on 01/07/2017 11/24/16   Benito Mccreedy, MD  ondansetron (ZOFRAN ODT) 8 MG disintegrating tablet Take 1 tablet (8 mg total) by mouth every 8 (eight) hours as needed for nausea or vomiting. Patient not taking: Reported on 11/30/2015 11/23/15   Jeannett Senior, PA-C  ondansetron (ZOFRAN) 4 MG tablet Take 1 tablet (4 mg total) daily as needed by mouth for nausea or vomiting. Patient not taking: Reported on 01/07/2017 11/24/16 11/24/17  Benito Mccreedy, MD  oxyCODONE-acetaminophen (PERCOCET) 10-325 MG tablet Take 1 tablet by mouth every 6 (six) hours as needed for pain. Patient not taking: Reported on 11/22/2016 09/25/15   Margarita Mail, PA-C  sucralfate (CARAFATE) 1 GM/10ML suspension Take 10 mLs (1 g total) by mouth 4 (four) times daily -  with meals and at bedtime. Patient not taking: Reported on 07/17/2016 11/30/15   Frederica Kuster, PA-C    Family History No family history on file.  Social History Social History   Tobacco Use  . Smoking status: Never Smoker  . Smokeless tobacco: Never Used  Substance Use Topics  . Alcohol use: No  . Drug use: No     Allergies   Aspirin; Maxzide [hydrochlorothiazide w-triamterene]; Metformin and related; Nsaids; Other; Pravachol [pravastatin]; Stadol [butorphanol]; Toradol [ketorolac tromethamine]; Vistaril [hydroxyzine hcl]; Erythromycin; Morphine and related; and Penicillins   Review of Systems Review of Systems  Constitutional: Positive for fatigue. Negative for fever.  Respiratory: Negative for cough and shortness of breath.   Cardiovascular: Negative for chest pain.  Gastrointestinal: Negative for abdominal pain.  Genitourinary: Negative for dysuria.  Musculoskeletal: Positive for arthralgias and back pain.  Neurological: Negative for syncope and headaches.  All other systems reviewed and are negative.    Physical Exam Updated Vital  Signs BP (!) 159/84 (BP Location: Right Arm)   Pulse (!) 112   Temp 98.6 F (37 C) (Oral)   Resp 18   Ht 1.651 m (5\' 5" )   Wt 65.3 kg (144 lb)   SpO2 97%   BMI 23.96 kg/m   Physical Exam CONSTITUTIONAL: Elderly, frail, appears older than stated age HEAD: Normocephalic/atraumatic EYES: EOMI/PERRL ENMT: Mucous membranes moist NECK: supple no meningeal signs SPINE/BACK: No cervical or thoracic tenderness, diffuse lumbar tenderness noted.  No bruising/crepitance/stepoffs noted to spine CV: S1/S2 noted, no murmurs/rubs/gallops noted LUNGS: Lungs are clear to auscultation bilaterally, no apparent distress ABDOMEN: soft, nontender GU:no cva tenderness NEURO: Pt is awake/alert/appropriate, moves all extremitiesx4.  No facial droop.   EXTREMITIES: pulses normal/equal, full ROM, all other extremities/joints palpated/ranged and nontender SKIN: warm, color normal PSYCH: Flat affect   ED Treatments / Results  Labs (all labs ordered are listed, but only abnormal results are displayed)  Labs Reviewed  BASIC METABOLIC PANEL - Abnormal; Notable for the following components:      Result Value   Sodium 134 (*)    Chloride 97 (*)    Glucose, Bld 417 (*)    All other components within normal limits  CBC - Abnormal; Notable for the following components:   WBC 12.3 (*)    All other components within normal limits  URINALYSIS, ROUTINE W REFLEX MICROSCOPIC - Abnormal; Notable for the following components:   APPearance CLOUDY (*)    Glucose, UA >=500 (*)    Hgb urine dipstick SMALL (*)    Protein, ur 100 (*)    Leukocytes, UA MODERATE (*)    Bacteria, UA MANY (*)    All other components within normal limits  CK - Abnormal; Notable for the following components:   Total CK 1,457 (*)    All other components within normal limits  CBG MONITORING, ED - Abnormal; Notable for the following components:   Glucose-Capillary 365 (*)    All other components within normal limits  URINE CULTURE    VALPROIC ACID LEVEL    EKG  EKG Interpretation  Date/Time:  Tuesday January 07 2017 23:22:12 EST Ventricular Rate:  108 PR Interval:    QRS Duration: 76 QT Interval:  360 QTC Calculation: 483 R Axis:   19 Text Interpretation:  Sinus tachycardia Low voltage, precordial leads Baseline wander in lead(s) I II aVR Interpretation limited secondary to artifact Confirmed by Ripley Fraise ([id number]) on 01/07/2017 11:33:09 PM       Radiology Dg Chest 1 View  Result Date: 01/08/2017 CLINICAL DATA:  Pain following fall EXAM: CHEST 1 VIEW COMPARISON:  July 17, 2016 FINDINGS: No edema or consolidation. Heart size and pulmonary vascularity are normal. No adenopathy. No pneumothorax. No evident bone lesions. IMPRESSION: No edema or consolidation. Electronically Signed   By: Lowella Grip III M.D.   On: 01/08/2017 00:00   Dg Lumbar Spine Complete  Result Date: 01/08/2017 CLINICAL DATA:  Back pain EXAM: LUMBAR SPINE - COMPLETE 4+ VIEW COMPARISON:  CT abdomen pelvis 11/22/2016 Lumbar spine radiograph 09/12/2016 FINDINGS: Unchanged chronic compression deformity of L3. No acute abnormality. Alignment is normal. Mild multilevel facet arthrosis. Mild upper lumbar degenerative disc disease. IMPRESSION: No acute abnormality of the lumbar spine. Unchanged chronic compression deformity of L3. Electronically Signed   By: Ulyses Jarred M.D.   On: 01/08/2017 00:15   Dg Hip Unilat With Pelvis 2-3 Views Right  Result Date: 01/07/2017 CLINICAL DATA:  Fall EXAM: DG HIP (WITH OR WITHOUT PELVIS) 2-3V RIGHT COMPARISON:  None. FINDINGS: Mild degenerative spurring in the hip joints bilaterally. SI joints are symmetric and unremarkable. No acute bony abnormality. Specifically, no fracture, subluxation, or dislocation. Soft tissues are intact. IMPRESSION: No acute bony abnormality. Electronically Signed   By: Rolm Baptise M.D.   On: 01/07/2017 17:35    Procedures Procedures (including critical care  time)  Medications Ordered in ED Medications  sodium chloride 0.9 % bolus 1,000 mL (0 mLs Intravenous Stopped 01/08/17 0256)  HYDROcodone-acetaminophen (NORCO/VICODIN) 5-325 MG per tablet 2 tablet (2 tablets Oral Given 01/07/17 2358)  sodium chloride 0.9 % bolus 1,000 mL (1,000 mLs Intravenous New Bag/Given 01/08/17 0256)  ciprofloxacin (CIPRO) IVPB 400 mg (400 mg Intravenous New Bag/Given 01/08/17 0245)     Initial Impression / Assessment and Plan / ED Course  I have reviewed the triage vital signs and the nursing notes.  Pertinent labs & imaging results that were available during  my care of the patient were reviewed by me and considered in my medical decision making (see chart for details). Narcotic database reviewed and considered in decision making    12:06 AM Patient reports frequent falls recently, but denies syncopal events Review of narcotic database indicates the patient takes Percocet tens as well as Embeda which is morphine.  Suspect falls may be related to her pain medications Labs/imaging pending at this time 12:44 AM Imaging negative Labs reviewed, will given an additional liter of fluid Awaiting urinalysis 3:46 AM Found to have a UTI and given IV Cipro Also noted to be in early rhabdomyolysis, probably due to the fact she is been lying on the floor after falls Due to multiple issues at this time she would need to be admitted.  Discussed with Dr. Myna Hidalgo for admission Final Clinical Impressions(s) / ED Diagnoses   Final diagnoses:  Acute cystitis without hematuria  Non-traumatic rhabdomyolysis  Weakness    ED Discharge Orders    None       Ripley Fraise, MD 01/08/17 [phone]

## 2017-01-08 NOTE — H&P (Addendum)
History and Physical    Andrea [name] QBH:[id number] DOB: Jun 09, 1950 DOA: 01/07/2017  PCP: Medicine, [facility]   Patient coming from: Home  Chief Complaint: Frequent falls, generalized weakness  HPI: Andrea [name] is a 66 y.o. female with medical history significant for chronic pain on opiates, insulin-dependent diabetes mellitus, hypothyroidism, and hypertension, now presenting to the emergency department for evaluation of generalized weakness and frequent falls.  Patient reports that for the past 2 weeks, she has had increasing difficulty ambulating and has had recurrent falls secondary to this.  She describes a generalized weakness, but also notes that the right leg is particularly weak and she usually falls when trying to step onto her right foot.  She had a fall yesterday evening onto her right side and now presents with severe pain in the right hip.  She has been able to bear weight and ambulate after the injury.  Denies hitting her head or losing consciousness.  Denies any change in vision or hearing.  No numbness.  ED Course: Upon arrival to the ED, patient is found to be afebrile, saturating well on room air, tachycardic in the low 100s, and with blood pressure stable.  EKG features a sinus tachycardia with rate 108.  Chest x-ray is negative for acute cardiopulmonary disease.  Radiographs of the right hip are negative for acute fracture or dislocation.  Lumbar spine radiographs were performed with no acute abnormalities.  Chemistry panel is notable for a glucose of 417.  CBC features a leukocytosis 12,300.  Urinalysis is suggestive of infection and sample will be sent for culture.  Serum CK is elevated to 1457.  Patient was treated with ciprofloxacin, 2 L of normal saline, and Percocet in the emergency department.  Tachycardia resolved, blood pressure remained stable, and the patient will be admitted to the medical-surgical unit for ongoing evaluation and  management weakness with recurrent falls UTI, and rhabdomyolysis.  Review of Systems:  All other systems reviewed and apart from HPI, are negative.  Past Medical History:  Diagnosis Date  . Breast cancer ([place])   . Diabetes mellitus without complication ([place])   . Esophageal reflux   . Hypertension   . Hypothyroid   . Migraine   . Seizure [facility])     Past Surgical History:  Procedure Laterality Date  . ABDOMINAL HYSTERECTOMY    . APPENDECTOMY    . CHOLECYSTECTOMY    . EYE SURGERY  08/28/2015   Right eye  . KNEE SURGERY    . MASTECTOMY       reports that  has never smoked. she has never used smokeless tobacco. She reports that she does not drink alcohol or use drugs.  Allergies  Allergen Reactions  . Aspirin Nausea And Vomiting  . Maxzide [Hydrochlorothiazide W-Triamterene] Swelling    Fluid retention  . Metformin And Related Diarrhea  . Nsaids Nausea And Vomiting  . Other Other (See Comments)    All steroids produce psychosis per pt Artificial sweeteners produce nausea and upset stomach.  Gregary Cromer [Pravastatin] Other (See Comments)    unknown  . Stadol [Butorphanol] Other (See Comments)    Toradol, etc And related- hallucinations  . Toradol [Ketorolac Tromethamine] Other (See Comments)    hallucinations  . Vistaril [Hydroxyzine Hcl] Other (See Comments)    unknown  . Erythromycin Itching and Rash  . Morphine And Related Hives and Rash  . Penicillins Hives, Itching and Rash    Has patient had a PCN reaction causing immediate rash, facial/tongue/throat  swelling, SOB or lightheadedness with hypotension: yes Has patient had a PCN reaction causing severe rash involving mucus membranes or skin necrosis: unknown Has patient had a PCN reaction that required hospitalization : unknown Has patient had a PCN reaction occurring within the last 10 years: no If all of the above answers are "NO", then may proceed with Cephalosporin use.     Family History  Problem Relation  Age of Onset  . Sudden Cardiac Death Neg Hx      Prior to Admission medications   Medication Sig Start Date End Date Taking? Authorizing Provider  ALPRAZolam (XANAX) 0.5 MG tablet TAKE 1 TABLET BY MOUTH 4 TIMES A DAY AS NEEDED FOR ANXIETY 11/21/16  Yes [provider]  atenolol (TENORMIN) 50 MG tablet Take 50 mg by mouth 2 (two) times daily.    Yes [provider]  divalproex (DEPAKOTE) 500 MG DR tablet Take 500 mg by mouth at bedtime.    Yes [provider]  EMBEDA 20-0.8 MG CPCR Take 1 tablet by mouth daily. 10/24/16  Yes [provider]  insulin glargine (LANTUS) 100 UNIT/ML injection Inject 30 Units into the skin daily.    Yes [provider]  latanoprost (XALATAN) 0.005 % ophthalmic solution Place 1 drop into both eyes at bedtime.    Yes [provider]  levothyroxine (SYNTHROID, LEVOTHROID) 200 MCG tablet Take 200 mcg by mouth at bedtime.  10/02/15  Yes [provider]  lisinopril (PRINIVIL,ZESTRIL) 10 MG tablet Take 10 mg by mouth at bedtime.    Yes [provider]  miconazole (MICOTIN) 100 MG vaginal suppository Place 100 mg vaginally as needed (Yeast infection).   Yes [provider]  ondansetron (ZOFRAN) 4 MG/2ML SOLN injection Inject 2 mLs (4 mg total) every 6 (six) hours as needed into the vein for nausea or vomiting. 11/24/16  Yes Osei-Bonsu, Iona Beard, MD  pregabalin (LYRICA) 75 MG capsule Take 1 capsule (75 mg total) 2 (two) times daily by mouth. 11/24/16  Yes Osei-Bonsu, Iona Beard, MD  promethazine (PHENERGAN) 25 MG suppository Place 1 suppository (25 mg total) rectally every 6 (six) hours as needed for nausea or vomiting. 11/30/15  Yes Frederica Kuster, PA-C    Physical Exam: Vitals:   01/08/17 0045 01/08/17 0100 01/08/17 0115 01/08/17 0300  BP: 135/66 (!) 128/59 124/63 133/62  Pulse: (!) 102 100 (!) 102 92  Resp: 14 10 19    Temp:      TempSrc:      SpO2: (!) 89% 95% 98% 96%  Weight:      Height:            Constitutional: NAD, calm, in apparent discomfort. Listless.  Eyes: PERTLA, lids and conjunctivae normal ENMT: Mucous membranes are moist. Posterior pharynx clear of any exudate or lesions.   Neck: normal, supple, no masses, no thyromegaly Respiratory: clear to auscultation bilaterally, no wheezing, no crackles. Normal respiratory effort.    Cardiovascular: S1 & S2 heard, regular rate and rhythm. No significant JVD. Abdomen: No distension, no tenderness, no masses palpated. Bowel sounds normal.  Musculoskeletal: no clubbing / cyanosis. No joint deformity upper and lower extremities.   Skin: no significant rashes, lesions, ulcers. Warm, dry, well-perfused. Neurologic: No facial asymmetry, PERRL, EOMI. Sensation to light touch intact. Strength 4/5 throughout bilateral upper extremities and LLE, 3/5 throughout the RLE.  Psychiatric: Somnolent, easily roused and oriented x 3. Calm, cooperative.     Labs on Admission: I have personally reviewed following labs and imaging studies  CBC: Recent Labs  Lab 01/07/17 1656  WBC 12.3*  HGB 13.7  HCT 39.9  MCV 89.9  PLT 119   Basic Metabolic Panel: Recent Labs  Lab 01/07/17 1656  NA 134*  K 4.0  CL 97*  CO2 25  GLUCOSE 417*  BUN 19  CREATININE 0.96  CALCIUM 9.2   GFR: Estimated Creatinine Clearance: 52.6 mL/min (by C-G formula based on SCr of 0.96 mg/dL). Liver Function Tests: No results for input(s): AST, ALT, ALKPHOS, BILITOT, PROT, ALBUMIN in the last 168 hours. No results for input(s): LIPASE, AMYLASE in the last 168 hours. No results for input(s): AMMONIA in the last 168 hours. Coagulation Profile: No results for input(s): INR, PROTIME in the last 168 hours. Cardiac Enzymes: Recent Labs  Lab 01/07/17 2348  CKTOTAL 1,457*   BNP (last 3 results) No results for input(s): PROBNP in the last 8760 hours. HbA1C: No results for input(s): HGBA1C in the last 72 hours. CBG: Recent Labs  Lab 01/07/17 2225  GLUCAP 365*    Lipid Profile: No results for input(s): CHOL, HDL, LDLCALC, TRIG, CHOLHDL, LDLDIRECT in the last 72 hours. Thyroid Function Tests: No results for input(s): TSH, T4TOTAL, FREET4, T3FREE, THYROIDAB in the last 72 hours. Anemia Panel: No results for input(s): VITAMINB12, FOLATE, FERRITIN, TIBC, IRON, RETICCTPCT in the last 72 hours. Urine analysis:    Component Value Date/Time   COLORURINE YELLOW 01/08/2017 0147   APPEARANCEUR CLOUDY (A) 01/08/2017 0147   LABSPEC 1.024 01/08/2017 0147   PHURINE 5.0 01/08/2017 0147   GLUCOSEU >=500 (A) 01/08/2017 0147   HGBUR SMALL (A) 01/08/2017 0147   BILIRUBINUR NEGATIVE 01/08/2017 0147   KETONESUR NEGATIVE 01/08/2017 0147   PROTEINUR 100 (A) 01/08/2017 0147   UROBILINOGEN 1.0 12/02/2013 1612   NITRITE NEGATIVE 01/08/2017 0147   LEUKOCYTESUR MODERATE (A) 01/08/2017 0147   Sepsis Labs: @LABRCNTIP (procalcitonin:4,lacticidven:4) )No results found for this or any previous visit (from the past 240 hour(s)).   Radiological Exams on Admission: Dg Chest 1 View  Result Date: 01/08/2017 CLINICAL DATA:  Pain following fall EXAM: CHEST 1 VIEW COMPARISON:  July 17, 2016 FINDINGS: No edema or consolidation. Heart size and pulmonary vascularity are normal. No adenopathy. No pneumothorax. No evident bone lesions. IMPRESSION: No edema or consolidation. Electronically Signed   By: Lowella Grip III M.D.   On: 01/08/2017 00:00   Dg Lumbar Spine Complete  Result Date: 01/08/2017 CLINICAL DATA:  Back pain EXAM: LUMBAR SPINE - COMPLETE 4+ VIEW COMPARISON:  CT abdomen pelvis 11/22/2016 Lumbar spine radiograph 09/12/2016 FINDINGS: Unchanged chronic compression deformity of L3. No acute abnormality. Alignment is normal. Mild multilevel facet arthrosis. Mild upper lumbar degenerative disc disease. IMPRESSION: No acute abnormality of the lumbar spine. Unchanged chronic compression deformity of L3. Electronically Signed   By: Ulyses Jarred M.D.   On: 01/08/2017 00:15    Dg Hip Unilat With Pelvis 2-3 Views Right  Result Date: 01/07/2017 CLINICAL DATA:  Fall EXAM: DG HIP (WITH OR WITHOUT PELVIS) 2-3V RIGHT COMPARISON:  None. FINDINGS: Mild degenerative spurring in the hip joints bilaterally. SI joints are symmetric and unremarkable. No acute bony abnormality. Specifically, no fracture, subluxation, or dislocation. Soft tissues are intact. IMPRESSION: No acute bony abnormality. Electronically Signed   By: Rolm Baptise M.D.   On: 01/07/2017 17:35    EKG: Independently reviewed. Sinus tachycardia (rate 108).   Assessment/Plan  1. Rhabdomyolysis  - Serum CK is elevated to 1457 on admission  - Likely secondary to trauma from fall, but she reports  2 wks of generalized weakness and myopathy is considered - Renal function is preserved - Given 2 liters NS in ED and continued on NS infusion  - Continue IVF, check serologies for myopathy    2. Generalized weakness; frequent falls  - Pt reports 2 wks of gen weakness, increased RLE weakness, and frequent falls  - No acute findings on radiographs of lumbar spine  - Possibly secondary to her opiate analgesia, CVA, or even a myopathy given the elevated CK  - Plan to check CT head and serologies, ask PT to eval and treat    3. UTI  - Urine suggestive of infection and sample will be sent for culture  - Prior urine culture grew ESBL E coli  - She was treated with empiric Cipro in ED in setting of penicillin allergy  - Continue treatment with meropenem pending culture data    4. Chronic pain  - Pt is followed by pain-management  - Continue home-regimen with morphine and prn Percocet    5. Hypothyroidism  - Check TSH given presentation  - Continue Synthroid    6. Hypertension  - BP at goal, continue lisinopril    7. Seizure disorder  - Denies recent seizures  - Continue Depakote     DVT prophylaxis: Lovenox Code Status: Full  Family Communication: Discussed with patient Disposition Plan: Admit to  med-surg Consults called: None Admission status: Inpatient   Vianne Bulls, MD [facility] Pager [phone]  If 7PM-7AM, please contact night-coverage [email or web address] Password [email or web address]  01/08/2017, 4:22 AM

## 2017-01-08 NOTE — ED Notes (Signed)
MD paged regarding changing diet order from regular to carb modified for diabetic patient

## 2017-01-08 NOTE — ED Notes (Signed)
Pt assisted in standing for orthostatic VS. Pt unsteady on feet and trembles.

## 2017-01-08 NOTE — ED Notes (Signed)
Pt anxious at this time due to commotion around the nurses station. Pt reassured she is safe.

## 2017-01-08 NOTE — ED Notes (Signed)
Notified pharmacy for medications that are overdue

## 2017-01-08 NOTE — Progress Notes (Signed)
Patient arrived to 6N27 A&Ox4, VSS, IV intact and infusing.  Blister present on inner right arch of foot, no other skin issues present.  Patient states pain level 9/10 but says her baseline is 6-7/10 due to chronic pain.  No family at bedside.  Patient oriented to room and equipment.  Will continue to monitor.

## 2017-01-08 NOTE — ED Notes (Signed)
Pt not in room, called CT, she just completed scan on the way back.

## 2017-01-08 NOTE — ED Notes (Signed)
Pt is resting and appears comfortable and has been asleep.  Reports pain is not better when woken.

## 2017-01-08 NOTE — Progress Notes (Signed)
This is a no charge note, for full details, please see H&P by my partner Dr. Myna Hidalgo from earlier today.  Patient was seen and examined.  Presented with weakness, fall and prolonged period down yesterday.  Dysuria.   Generalized weakness Multifactorial from opiates, dehydration, UTI, hypothyroidism?[name]  Myopathy within differential.   -PT eval  UTI/pyelonephritis Hx of ESBL a year ago.  Presented with dysuria, leukocyturia, tachcyardia, leukocytosis.  Lactate not checked.  Appears clinically NOT septic. -Narrow to Ciprofloxacin -Monitor cultures  Rhabdo From period of being down >4 hrs yesterday.  Improving -Trend CK  Chronic pain -Continue Norco -Continue Lyrica -Continue Xanax -Continue Morphine/naltrexone combo  Hypothyroidism TSH up.  Already very high dose of levothyroxine.  Presumably this is from poor adherence, taking with food/supplements or some other interaction. Will investigate more and defer uptitration to her PCP.   -Will ask pharmacy to review interactions with her pain meds, Depakote -Will review her method of taking medicine carefully -Check T3/free T4  Hypertension -Continue lisinopril, atenolol  Seizures -Continue Depakote  Diabetes -Continue glargine -SSI

## 2017-01-08 NOTE — ED Notes (Signed)
Pt was able to ambulate with some assist.  She uses a walker at home.  Pt is lethargic but responds to voice.

## 2017-01-09 DIAGNOSIS — W19XXXA Unspecified fall, initial encounter: Secondary | ICD-10-CM

## 2017-01-09 DIAGNOSIS — R531 Weakness: Secondary | ICD-10-CM

## 2017-01-09 LAB — CBC
HEMATOCRIT: 31.5 % — AB (ref 36.0–46.0)
HEMOGLOBIN: 10.7 g/dL — AB (ref 12.0–15.0)
MCH: 30.2 pg (ref 26.0–34.0)
MCHC: 34 g/dL (ref 30.0–36.0)
MCV: 89 fL (ref 78.0–100.0)
Platelets: 270 10*3/uL (ref 150–400)
RBC: 3.54 MIL/uL — ABNORMAL LOW (ref 3.87–5.11)
RDW: 13 % (ref 11.5–15.5)
WBC: 7.8 10*3/uL (ref 4.0–10.5)

## 2017-01-09 LAB — BASIC METABOLIC PANEL
ANION GAP: 4 — AB (ref 5–15)
BUN: 11 mg/dL (ref 6–20)
CHLORIDE: 107 mmol/L (ref 101–111)
CO2: 25 mmol/L (ref 22–32)
Calcium: 7.8 mg/dL — ABNORMAL LOW (ref 8.9–10.3)
Creatinine, Ser: 0.64 mg/dL (ref 0.44–1.00)
GFR calc Af Amer: >60 mL/min (ref >60)
GLUCOSE: 206 mg/dL — AB (ref 65–99)
POTASSIUM: 4.1 mmol/L (ref 3.5–5.1)
Sodium: 136 mmol/L (ref 135–145)

## 2017-01-09 LAB — GLUCOSE, CAPILLARY
GLUCOSE-CAPILLARY: 271 mg/dL — AB (ref 65–99)
GLUCOSE-CAPILLARY: 227 mg/dL — AB (ref 65–99)
GLUCOSE-CAPILLARY: 238 mg/dL — AB (ref 65–99)
Glucose-Capillary: 185 mg/dL — ABNORMAL HIGH (ref 65–99)

## 2017-01-09 LAB — CK: Total CK: 313 U/L — ABNORMAL HIGH (ref 38–234)

## 2017-01-09 LAB — ANTINUCLEAR ANTIBODIES, IFA: ANA Ab, IFA: NEGATIVE

## 2017-01-09 LAB — T4, FREE: FREE T4: 0.86 ng/dL (ref 0.61–1.12)

## 2017-01-09 LAB — ALDOLASE: ALDOLASE: 17.3 U/L — AB (ref 3.3–10.3)

## 2017-01-09 LAB — ANTI-SMITH ANTIBODY

## 2017-01-09 LAB — ANTI-SMOOTH MUSCLE ANTIBODY, IGG: F-ACTIN AB IGG: 15 U (ref 0–19)

## 2017-01-09 LAB — SJOGRENS SYNDROME-B EXTRACTABLE NUCLEAR ANTIBODY

## 2017-01-09 LAB — SJOGRENS SYNDROME-A EXTRACTABLE NUCLEAR ANTIBODY

## 2017-01-09 MED ORDER — CIPROFLOXACIN HCL 500 MG PO TABS
500.0000 mg | ORAL_TABLET | Freq: Two times a day (BID) | ORAL | Status: DC
  Administered 2017-01-09 – 2017-01-10 (×2): 500 mg via ORAL
  Filled 2017-01-09 (×2): qty 1

## 2017-01-09 MED ORDER — GLUCERNA SHAKE PO LIQD: 237.0000 mL | Freq: Two times a day (BID) | ORAL | Status: DC

## 2017-01-09 NOTE — Progress Notes (Signed)
Nutrition Brief Note  Patient identified on the Malnutrition Screening Tool (MST) Report  Wt Readings from Last 15 Encounters:  01/09/17 149 lb 14.6 oz (68 kg)  11/24/16 156 lb 1.4 oz (70.8 kg)  11/30/15 149 lb (67.6 kg)  11/23/15 147 lb (66.7 kg)  09/25/15 158 lb (71.7 kg)  11/29/13 165 lb 2 oz (74.9 kg)   66 yo F with chronic pain, IDDM, hypothyroidism, HTN who presents with progressive weakness, falls over 2 weeks.  On arrival, noted to have had a fall and prolonged period down yesterday, CK level suggested non-traumatic rhabdomyolysis, and also found to have leukocytosis, tachycardia, tachypnea, and hyperglycemia.  Pt admitted with rhabdomyolysis.   Pt sleeping soundly at time of visit. She did not arouse enough to participate in interview.   Reviewed wt hx, UBW around 150#.   Nutrition-Focused physical exam completed. Findings are no fat depletion, no muscle depletion, and no edema.   Body mass index is 25.73 kg/m. Patient meets criteria for overweight based on current BMI.   Current diet order is carb modified, patient is consuming approximately 50-100% of meals at this time. Labs and medications reviewed.   No nutrition interventions warranted at this time. If nutrition issues arise, please consult RD.   Tryniti Laatsch A. Jimmye Norman, RD, LDN, CDE Pager: [phone] After hours Pager: [phone]

## 2017-01-09 NOTE — Evaluation (Signed)
Physical Therapy Evaluation Patient Details Name: Andrea [name] MRN: [id number] DOB: May 16, 1950 Today's Date: 01/09/2017   History of Present Illness  66 yo female with onset of  weakness and mult falls in last 2 weeks, was down awhile yesterday and now rhabdomyolysis and leukocytosis, tachycardia, tachypnea and hyperglycemia.  Has UTI.  PMHx: DM, HTN, low thyroid and chronic pain, epilepsy,   Clinical Impression  Pt was able to be assisted to walk for a very short trip with poor safety awareness, and did not attempt stairs with her yet.  Will plan to see her for further mobility as she tolerates, to be OOB and aware that she is impulsive and not safe yet, and will need SNF care as her family does not have 24/7 care for her in place.  Progress acute therapy as pt tolerates to decrease need to stay in SNF.    Follow Up Recommendations SNF    Equipment Recommendations  Rolling walker with 5" wheels(if pt does not have adequate walker)    Recommendations for Other Services       Precautions / Restrictions Precautions Precautions: Fall Precaution Comments: ck vitals with mobility Restrictions Weight Bearing Restrictions: No      Mobility  Bed Mobility Overal bed mobility: Needs Assistance Bed Mobility: Supine to Sit     Supine to sit: Min assist     General bed mobility comments: minor help to support trunk and finish scooting to EOB  Transfers Overall transfer level: Needs assistance Equipment used: Rolling walker (2 wheeled);1 person hand held assist Transfers: Sit to/from Stand Sit to Stand: Min assist         General transfer comment: cued every trial for hand placement  Ambulation/Gait Ambulation/Gait assistance: Min guard;Min assist Ambulation Distance (Feet): 14 Feet Assistive device: Rolling walker (2 wheeled);1 person hand held assist Gait Pattern/deviations: Step-to pattern;Step-through pattern;Decreased stride length;Wide base of support;Trunk  flexed;Drifts right/left Gait velocity: reduced Gait velocity interpretation: Below normal speed for age/gender General Gait Details: not using care about her direction of walker, not aware of safety and planning to get up with the walker  Stairs            Wheelchair Mobility    Modified Rankin (Stroke Patients Only)       Balance Overall balance assessment: History of Falls;Needs assistance Sitting-balance support: Feet supported;Single extremity supported Sitting balance-Leahy Scale: Fair Sitting balance - Comments: requires supervision as she is trying to stand without help   Standing balance support: Bilateral upper extremity supported;During functional activity Standing balance-Leahy Scale: Poor                               Pertinent Vitals/Pain Pain Assessment: Faces Faces Pain Scale: Hurts little more Pain Location: general stiffness Pain Intervention(s): Monitored during session;Repositioned;Limited activity within patient's tolerance    Home Living Family/patient expects to be discharged to:: Private residence Living Arrangements: Alone Available Help at Discharge: Family;Available PRN/intermittently Type of Home: House Home Access: Stairs to enter Entrance Stairs-Rails: None Entrance Stairs-Number of Steps: 2 Home Layout: One level Home Equipment: Walker - 2 wheels Additional Comments: pt is not clear on her family assistance, contradicting herself    Prior Function Level of Independence: Independent with assistive device(s)         Comments: despite independence has been falling a great deal recently     Hand Dominance        Extremity/Trunk Assessment   Upper  Extremity Assessment Upper Extremity Assessment: Overall WFL for tasks assessed    Lower Extremity Assessment Lower Extremity Assessment: Generalized weakness    Cervical / Trunk Assessment Cervical / Trunk Assessment: Kyphotic  Communication   Communication: No  difficulties  Cognition Arousal/Alertness: Awake/alert Behavior During Therapy: Impulsive Overall Cognitive Status: No family/caregiver present to determine baseline cognitive functioning                                 General Comments: pt states her son expects her to come to live with him, not forthcoming with the reasons x falls      General Comments General comments (skin integrity, edema, etc.): pt is on IV and has walker with some safety awareness loss to know her limits and abilities    Exercises     Assessment/Plan    PT Assessment Patient needs continued PT services  PT Problem List Decreased strength;Decreased range of motion;Decreased activity tolerance;Decreased balance;Decreased mobility;Decreased coordination;Decreased cognition;Decreased knowledge of use of DME;Decreased safety awareness       PT Treatment Interventions DME instruction;Gait training;Stair training;Functional mobility training;Therapeutic activities;Therapeutic exercise;Balance training;Neuromuscular re-education;Patient/family education    PT Goals (Current goals can be found in the Care Plan section)  Acute Rehab PT Goals Patient Stated Goal: to go to son's house and live PT Goal Formulation: With patient Time For Goal Achievement: 01/23/17 Potential to Achieve Goals: Good    Frequency Min 2X/week   Barriers to discharge Inaccessible home environment;Decreased caregiver support home alone in the daytime and has stairs to enter son's house    Co-evaluation               AM-PAC PT "6 Clicks" Daily Activity  Outcome Measure Difficulty turning over in bed (including adjusting bedclothes, sheets and blankets)?: A Lot Difficulty moving from lying on back to sitting on the side of the bed? : Unable Difficulty sitting down on and standing up from a chair with arms (e.g., wheelchair, bedside commode, etc,.)?: Unable Help needed moving to and from a bed to chair (including a  wheelchair)?: A Little Help needed walking in hospital room?: A Little Help needed climbing 3-5 steps with a railing? : Total 6 Click Score: 11    End of Session Equipment Utilized During Treatment: Gait belt Activity Tolerance: Patient limited by fatigue;Other (comment)(weakness limiting her) Patient left: in chair;with call bell/phone within reach;with chair alarm set Nurse Communication: Mobility status PT Visit Diagnosis: Unsteadiness on feet (R26.81);Repeated falls (R29.6);Muscle weakness (generalized) (M62.81);Difficulty in walking, not elsewhere classified (R26.2);Other (comment)(safety awareness losses)    Time: 1340-1407 PT Time Calculation (min) (ACUTE ONLY): 27 min   Charges:   PT Evaluation $PT Eval Moderate Complexity: 1 Mod PT Treatments $Gait Training: 8-22 mins   PT G Codes:   PT G-Codes **NOT FOR INPATIENT CLASS** Functional Assessment Tool Used: AM-PAC 6 Clicks Basic Mobility   Ramond Dial 01/09/2017, 4:30 PM   Mee Hives, PT MS Acute Rehab Dept. Number: [place] [facility] [place]

## 2017-01-09 NOTE — Progress Notes (Signed)
PROGRESS NOTE    Andrea [name]  OAC:[id number] DOB: 03-11-1950 DOA: 01/07/2017 PCP: Medicine, [facility] [place] [facility]      Brief Narrative:  66 yo F with chronic pain, IDDM, hypothyroidism, HTN who presents with progressive weakness, falls over 2 weeks.  On arrival, noted to have had a fall and prolonged period down yesterday, CK level suggested non-traumatic rhabdomyolysis, and also found to have leukocytosis, tachycardia, tachypnea, and hyperglycemia.  Given fluids, started on IV antibiotics and admitted for further management.   Assessment & Plan:  Active Problems:   Hypothyroid   Epilepsy ([place])   UTI (urinary tract infection)   Frequent falls   Rhabdomyolysis   Chronic pain    Generalized weakness Multifactorial from dehydration, UTI, hypothyroidism.   -PT eval  UTI/pyelonephritis Hx of ESBL a year ago, cultures have not yet returned.  Given presentation with collapse, leukocytosis, tachycardia and possible sepsis, I am uncomfortable discharging without culture data to guide therapy.  -Monitor on Ciprofloxacin -Monitor cultures  Rhabdomyloysis Aldolase, LDH, CK up.  Markers of inflammatory myopathy normal (normal ANA, [place] Abs, Anti-sm musc, Anti-smith Abs).  From period of being down >4 hrs yesterday.  Improving. -Continue IV fluids -Trend CK  Chronic pain -Continue Norco -Continue Lyrica -Continue Xanax -Continue Morphine combo  Hypothyroidism TSH up.  Already very high dose of levothyroxine.  Presumably this is from poor adherence, taking with food/supplements or some other interaction. Will investigate more and defer uptitration to her PCP.  None of her meds interact with levothyroxine.  T4 normal.   -Encourage adherence -Repeat TSH in 1 month  Hypertension -Continue lisinopril, atenolol  Seizures -Continue Depakote  Diabetes -Continue glargine -SSI     DVT prophylaxis: Lovenox Code Status: FULL Family Communication: None  present Disposition Plan: Continue levothyroxine, follow up cluture data.  Given her Rhabdo, I will continue IV fluids.  Given her weakness, including a recent fall and prolonged period unable to get up, I will wait for PT evaluation for likely HHPT.  Given her history of ESBL UTI and presentation with SIRS possible sepsis, I will continue antibiotics and await urine culture sensitivities.   Consultants:   None  Procedures:   Noneo  Antimicrobials:   Meropenem 12/18 >>12/19  Ciprofloxacin 12/19 >>    Subjective: Feels tired.  Weak.  No new fever, chills, dysiuria, cough, sputum, dyspnea, chest pain.  Objective: Vitals:   01/08/17 2100 01/09/17 0615 01/09/17 0700 01/09/17 1321  BP: (!) 152/62 (!) 146/66  132/69  Pulse: 77 73  82  Resp: 16 16  11   Temp: 97.9 F (36.6 C) 98.6 F (37 C)  98.9 F (37.2 C)  TempSrc: Oral Oral  Oral  SpO2: 97% 98%  95%  Weight:   68 kg (149 lb 14.6 oz)   Height:        Intake/Output Summary (Last 24 hours) at 01/09/2017 1541 Last data filed at 01/09/2017 1414 Gross per 24 hour  Intake 718.75 ml  Output 900 ml  Net -181.25 ml   Filed Weights   01/07/17 1651 01/08/17 1428 01/09/17 0700  Weight: 65.3 kg (144 lb) 64.4 kg (142 lb) 68 kg (149 lb 14.6 oz)    Examination: General appearance: Overweight adult female, awake but listless tired.     HEENT: Anicteric, conjunctiva pink, lids and lashes normal. No nasal deformity, discharge, epistaxis.  Lips moist.   Skin: Pale dry. No peau dorange on legs.  No jaundice.  No suspicious rashes or lesions. Cardiac: RRR, nl S1-S2,  no murmurs appreciated.  Capillary refill is brisk.  JVP not visible.  No LE edema.  Radial pulses 2+ and symmetric. Respiratory: Normal respiratory rate and rhythm.  CTAB without rales or wheezes. Abdomen: Abdomen soft.  Moderate suprapubic TTP. No ascites, distension, hepatosplenomegaly.   MSK: No deformities or effusions. Neuro: Awake and alert.  EOMI, moves all  extremities. Speech fluent.    Psych: Sensorium intact and responding to questions, attention normal. Affect blutned.  Judgment and insight appear normal.    Data Reviewed: I have personally reviewed following labs and imaging studies:  CBC: Recent Labs  Lab 01/07/17 1656 01/08/17 0422 01/09/17 1148  WBC 12.3* 11.9* 7.8  NEUTROABS  --  6.6  --   HGB 13.7 12.1 10.7*  HCT 39.9 35.4* 31.5*  MCV 89.9 89.4 89.0  PLT 315 293 678   Basic Metabolic Panel: Recent Labs  Lab 01/07/17 1656 01/08/17 0422 01/09/17 1148  NA 134* 135 136  K 4.0 3.6 4.1  CL 97* 101 107  CO2 25 25 25   GLUCOSE 417* 291* 206*  BUN 19 19 11   CREATININE 0.96 0.87 0.64  CALCIUM 9.2 8.2* 7.8*   GFR: Estimated Creatinine Clearance: 66.4 mL/min (by C-G formula based on SCr of 0.64 mg/dL). Liver Function Tests: Recent Labs  Lab 01/08/17 0422  AST 36  ALT 21  ALKPHOS 66  BILITOT 0.7  PROT 6.0*  ALBUMIN 2.8*   No results for input(s): LIPASE, AMYLASE in the last 168 hours. No results for input(s): AMMONIA in the last 168 hours. Coagulation Profile: No results for input(s): INR, PROTIME in the last 168 hours. Cardiac Enzymes: Recent Labs  Lab 01/07/17 2348 01/08/17 0422 01/09/17 1047  CKTOTAL 1,457* 855* 313*   BNP (last 3 results) No results for input(s): PROBNP in the last 8760 hours. HbA1C: No results for input(s): HGBA1C in the last 72 hours. CBG: Recent Labs  Lab 01/08/17 1153 01/08/17 1701 01/08/17 2145 01/09/17 0743 01/09/17 1211  GLUCAP 223* 183* 239* 271* 185*   Lipid Profile: No results for input(s): CHOL, HDL, LDLCALC, TRIG, CHOLHDL, LDLDIRECT in the last 72 hours. Thyroid Function Tests: Recent Labs    01/08/17 0423 01/09/17 1148  TSH 26.318*  --   FREET4  --  0.86   Anemia Panel: No results for input(s): VITAMINB12, FOLATE, FERRITIN, TIBC, IRON, RETICCTPCT in the last 72 hours. Urine analysis:    Component Value Date/Time   COLORURINE YELLOW 01/08/2017 0147    APPEARANCEUR CLOUDY (A) 01/08/2017 0147   LABSPEC 1.024 01/08/2017 0147   PHURINE 5.0 01/08/2017 0147   GLUCOSEU >=500 (A) 01/08/2017 0147   HGBUR SMALL (A) 01/08/2017 0147   BILIRUBINUR NEGATIVE 01/08/2017 0147   KETONESUR NEGATIVE 01/08/2017 0147   PROTEINUR 100 (A) 01/08/2017 0147   UROBILINOGEN 1.0 12/02/2013 1612   NITRITE NEGATIVE 01/08/2017 0147   LEUKOCYTESUR MODERATE (A) 01/08/2017 0147   Sepsis Labs: @LABRCNTIP (procalcitonin:4,lacticidven:4)  ) Recent Results (from the past 240 hour(s))  Urine culture     Status: Abnormal (Preliminary result)   Collection Time: 01/08/17  2:23 AM  Result Value Ref Range Status   Specimen Description URINE, RANDOM  Final   Special Requests NONE  Final   Culture >=100,000 COLONIES/mL ESCHERICHIA COLI (A)  Final   Report Status PENDING  Incomplete         Radiology Studies: Dg Chest 1 View  Result Date: 01/08/2017 CLINICAL DATA:  Pain following fall EXAM: CHEST 1 VIEW COMPARISON:  July 17, 2016 FINDINGS: No edema  or consolidation. Heart size and pulmonary vascularity are normal. No adenopathy. No pneumothorax. No evident bone lesions. IMPRESSION: No edema or consolidation. Electronically Signed   By: Lowella Grip III M.D.   On: 01/08/2017 00:00   Dg Lumbar Spine Complete  Result Date: 01/08/2017 CLINICAL DATA:  Back pain EXAM: LUMBAR SPINE - COMPLETE 4+ VIEW COMPARISON:  CT abdomen pelvis 11/22/2016 Lumbar spine radiograph 09/12/2016 FINDINGS: Unchanged chronic compression deformity of L3. No acute abnormality. Alignment is normal. Mild multilevel facet arthrosis. Mild upper lumbar degenerative disc disease. IMPRESSION: No acute abnormality of the lumbar spine. Unchanged chronic compression deformity of L3. Electronically Signed   By: Ulyses Jarred M.D.   On: 01/08/2017 00:15   Ct Head Wo Contrast  Result Date: 01/08/2017 CLINICAL DATA:  Fall.  Ataxia. EXAM: CT HEAD WITHOUT CONTRAST TECHNIQUE: Contiguous axial images were  obtained from the base of the skull through the vertex without intravenous contrast. COMPARISON:  Head CT 12/03/2013 FINDINGS: Brain: No mass lesion, intraparenchymal hemorrhage or extra-axial collection. No evidence of acute cortical infarct. There is periventricular hypoattenuation compatible with chronic microvascular disease. 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 Vascular: No hyperdense vessel or unexpected calcification. Skull: Normal visualized skull base, calvarium and extracranial soft tissues. Sinuses/Orbits: No sinus fluid levels or advanced mucosal thickening. No mastoid effusion. Normal orbits. IMPRESSION: Chronic microvascular ischemia and old lacunar infarcts without acute intracranial abnormality. Electronically Signed   By: Ulyses Jarred M.D.   On: 01/08/2017 04:51   Dg Hip Unilat With Pelvis 2-3 Views Right  Result Date: 01/07/2017 CLINICAL DATA:  Fall EXAM: DG HIP (WITH OR WITHOUT PELVIS) 2-3V RIGHT COMPARISON:  None. FINDINGS: Mild degenerative spurring in the hip joints bilaterally. SI joints are symmetric and unremarkable. No acute bony abnormality. Specifically, no fracture, subluxation, or dislocation. Soft tissues are intact. IMPRESSION: No acute bony abnormality. Electronically Signed   By: Rolm Baptise M.D.   On: 01/07/2017 17:35        Scheduled Meds: . atenolol  50 mg Oral BID  . ciprofloxacin  500 mg Oral BID  . divalproex  500 mg Oral QHS  . enoxaparin (LOVENOX) injection  40 mg Subcutaneous Q24H  . feeding supplement (ENSURE ENLIVE)  237 mL Oral BID BM  . Influenza vac split quadrivalent PF  0.5 mL Intramuscular Tomorrow-1000  . insulin aspart  0-15 Units Subcutaneous TID WC  . insulin aspart  0-5 Units Subcutaneous QHS  . insulin glargine  30 Units Subcutaneous QHS  . latanoprost  1 drop Both Eyes QHS  . levothyroxine  200 mcg Oral QHS  . lisinopril  10 mg Oral QHS  . morphine  15 mg Oral Daily  . pneumococcal 23 valent vaccine  0.5 mL Intramuscular  Tomorrow-1000  . pregabalin  75 mg Oral BID   Continuous Infusions: . 0.9 % NaCl with KCl 20 mEq / L 75 mL/hr at 01/09/17 0528     LOS: 1 day    Time spent: 30 minutes    Edwin Dada, MD [facility] Pager [phone]  If 7PM-7AM, please contact night-coverage [email or web address] Password [email or web address] 01/09/2017, 3:41 PM

## 2017-01-10 LAB — BASIC METABOLIC PANEL
ANION GAP: 4 — AB (ref 5–15)
BUN: 7 mg/dL (ref 6–20)
CALCIUM: 8.1 mg/dL — AB (ref 8.9–10.3)
CO2: 26 mmol/L (ref 22–32)
Chloride: 110 mmol/L (ref 101–111)
Creatinine, Ser: 0.6 mg/dL (ref 0.44–1.00)
GFR calc Af Amer: >60 mL/min (ref >60)
GLUCOSE: 168 mg/dL — AB (ref 65–99)
Potassium: 4.3 mmol/L (ref 3.5–5.1)
Sodium: 140 mmol/L (ref 135–145)

## 2017-01-10 LAB — GLUCOSE, CAPILLARY
Glucose-Capillary: 152 mg/dL — ABNORMAL HIGH (ref 65–99)
Glucose-Capillary: 174 mg/dL — ABNORMAL HIGH (ref 65–99)
Glucose-Capillary: 297 mg/dL — ABNORMAL HIGH (ref 65–99)
Glucose-Capillary: 215 mg/dL — ABNORMAL HIGH (ref 65–99)

## 2017-01-10 LAB — URINE CULTURE: Culture: >=100000 — AB

## 2017-01-10 LAB — T3: T3, Total: 92 ng/dL (ref 71–180)

## 2017-01-10 LAB — CK: CK TOTAL: 179 U/L (ref 38–234)

## 2017-01-10 MED ORDER — SODIUM CHLORIDE 0.9 % IV SOLN
1.0000 g | INTRAVENOUS | Status: DC
  Administered 2017-01-10 – 2017-01-12 (×3): 1 g via INTRAVENOUS
  Filled 2017-01-10 (×3): qty 1

## 2017-01-10 NOTE — Progress Notes (Signed)
PROGRESS NOTE    Andrea [name]  GUY:[id number] DOB: March 29, 1950 DOA: 01/07/2017 PCP: Medicine, [facility]      Brief Narrative:  66 yo F with chronic pain, IDDM, hypothyroidism, HTN who presents with progressive weakness, falls over 2 weeks.  On arrival, noted to have had a fall and prolonged period down yesterday, CK level suggested non-traumatic rhabdomyolysis, and also found to have leukocytosis, tachycardia, tachypnea, and hyperglycemia.  Given fluids, started on IV antibiotics and admitted for further management.   Assessment & Plan:  Active Problems:   Hypothyroid   Epilepsy ([place])   UTI (urinary tract infection)   Frequent falls   Rhabdomyolysis   Chronic pain    Generalized weakness Multifactorial from dehydration, UTI, hypothyroidism.   -PT eval  UTI/pyelonephritis ESBL bacteria Culture showing ESBL.  Discussed with ID, they recommend 3 days Invanz at least, and if stable, can finish with one dose fosfomycin. -Stop cipro -Start Invanz   Rhabdomyloysis Resolved.  Chronic pain -Continue Norco -Continue Lyrica -Continue Xanax -Continue Morphine combo  Hypothyroidism TSH up.  She states she takes her medicines 2-3 times per week.  Also has no idea to take this on an empty stomach.  Her son was already concerned about her ability to manage her medical care and other household responsibilities and she was planning to move in with him after Jan 1.   -Continue home levothyroxine 200 mcg daily -Repeat TSH in 1 month  Hypertension -Continue lisinopril, atenolol  Seizures -Continue Depakote  Diabetes -Continue glargine -SSI     DVT prophylaxis: Lovenox Code Status: FULL Family Communication: None present Disposition Plan: Given ESBL UTI with presentation weak, with leukocytosis and tachycardia and falls, will give IV Invanz.  Will need at least 3 days Invanz, then if not full 7 days, finish with fosfomycin PO.       Consultants:   None  Procedures:   Noneo  Antimicrobials:   Meropenem 12/18 >>12/19  Ciprofloxacin 12/19 >> 12/21  Invanz 12/21 >>    Subjective: Feeling stronger, still tired. Weak, mentally foggy.  No constipation.  No new fever, chills, vomiting, cough, sputum, dyspnea, chest pain.  Objective: Vitals:   01/09/17 2115 01/10/17 0500 01/10/17 0537 01/10/17 1639  BP: (!) 155/55  (!) 154/58 (!) 153/77  Pulse: 85  72 77  Resp: 16  16 16   Temp: 98.6 F (37 C)  98.4 F (36.9 C) 98.8 F (37.1 C)  TempSrc: Oral  Oral Oral  SpO2: 90%  94% 96%  Weight:  68 kg (149 lb 14.6 oz)    Height:        Intake/Output Summary (Last 24 hours) at 01/10/2017 1831 Last data filed at 01/10/2017 1236 Gross per 24 hour  Intake -  Output 700 ml  Net -700 ml   Filed Weights   01/08/17 1428 01/09/17 0700 01/10/17 0500  Weight: 64.4 kg (142 lb) 68 kg (149 lb 14.6 oz) 68 kg (149 lb 14.6 oz)    Examination: General appearance: Overweight adult female, awake, in no acute distress. Tired.     HEENT: Anicteric, conjunctiva pink, lids and lashes normal. No nasal deformity, discharge, epistaxis.  Lips moist.   Skin: Pale dry. No peau dorange on legs.  No jaundice.  No suspicious rashes or lesions. Cardiac: RRR, nl S1-S2, no murmurs appreciated.  Capillary refill is brisk.  JVP not visible.  No LE edema.  Radial pulses 2+ and symmetric. Respiratory: Normal respiratory rate and rhythm.  CTAB without rales or  wheezes. Abdomen: Abdomen soft.  Moderate suprapubic TTP, improved. No ascites, distension, hepatosplenomegaly.   MSK: No deformities or effusions. Neuro: Awake and alert.  EOMI, moves all extremities. Speech fluent.    Psych: Sensorium intact and responding to questions, attention normal. Affect blutned.  Judgment and insight appear normal.    Data Reviewed: I have personally reviewed following labs and imaging studies:  CBC: Recent Labs  Lab 01/07/17 1656 01/08/17 0422  01/09/17 1148  WBC 12.3* 11.9* 7.8  NEUTROABS  --  6.6  --   HGB 13.7 12.1 10.7*  HCT 39.9 35.4* 31.5*  MCV 89.9 89.4 89.0  PLT 315 293 979   Basic Metabolic Panel: Recent Labs  Lab 01/07/17 1656 01/08/17 0422 01/09/17 1148 01/10/17 0630  NA 134* 135 136 140  K 4.0 3.6 4.1 4.3  CL 97* 101 107 110  CO2 25 25 25 26   GLUCOSE 417* 291* 206* 168*  BUN 19 19 11 7   CREATININE 0.96 0.87 0.64 0.60  CALCIUM 9.2 8.2* 7.8* 8.1*   GFR: Estimated Creatinine Clearance: 66.4 mL/min (by C-G formula based on SCr of 0.6 mg/dL). Liver Function Tests: Recent Labs  Lab 01/08/17 0422  AST 36  ALT 21  ALKPHOS 66  BILITOT 0.7  PROT 6.0*  ALBUMIN 2.8*   No results for input(s): LIPASE, AMYLASE in the last 168 hours. No results for input(s): AMMONIA in the last 168 hours. Coagulation Profile: No results for input(s): INR, PROTIME in the last 168 hours. Cardiac Enzymes: Recent Labs  Lab 01/07/17 2348 01/08/17 0422 01/09/17 1047 01/10/17 0630  CKTOTAL 1,457* 855* 313* 179   BNP (last 3 results) No results for input(s): PROBNP in the last 8760 hours. HbA1C: No results for input(s): HGBA1C in the last 72 hours. CBG: Recent Labs  Lab 01/09/17 1653 01/09/17 2112 01/10/17 0755 01/10/17 1222 01/10/17 1644  GLUCAP 227* 238* 152* 174* 297*   Lipid Profile: No results for input(s): CHOL, HDL, LDLCALC, TRIG, CHOLHDL, LDLDIRECT in the last 72 hours. Thyroid Function Tests: Recent Labs    01/08/17 0423 01/09/17 1148  TSH 26.318*  --   FREET4  --  0.86   Anemia Panel: No results for input(s): VITAMINB12, FOLATE, FERRITIN, TIBC, IRON, RETICCTPCT in the last 72 hours. Urine analysis:    Component Value Date/Time   COLORURINE YELLOW 01/08/2017 0147   APPEARANCEUR CLOUDY (A) 01/08/2017 0147   LABSPEC 1.024 01/08/2017 0147   PHURINE 5.0 01/08/2017 0147   GLUCOSEU >=500 (A) 01/08/2017 0147   HGBUR SMALL (A) 01/08/2017 0147   BILIRUBINUR NEGATIVE 01/08/2017 0147   KETONESUR  NEGATIVE 01/08/2017 0147   PROTEINUR 100 (A) 01/08/2017 0147   UROBILINOGEN 1.0 12/02/2013 1612   NITRITE NEGATIVE 01/08/2017 0147   LEUKOCYTESUR MODERATE (A) 01/08/2017 0147   Sepsis Labs: @LABRCNTIP (procalcitonin:4,lacticidven:4)  ) Recent Results (from the past 240 hour(s))  Urine culture     Status: Abnormal   Collection Time: 01/08/17  2:23 AM  Result Value Ref Range Status   Specimen Description URINE, RANDOM  Final   Special Requests NONE  Final   Culture (A)  Final    >=100,000 COLONIES/mL ESCHERICHIA COLI Confirmed Extended Spectrum Beta-Lactamase Producer (ESBL).  In bloodstream infections from ESBL organisms, carbapenems are preferred over piperacillin/tazobactam. They are shown to have a lower risk of mortality.    Report Status 01/10/2017 FINAL  Final   Organism ID, Bacteria ESCHERICHIA COLI (A)  Final      Susceptibility   Escherichia coli - MIC*  AMPICILLIN >=32 RESISTANT Resistant     CEFAZOLIN >=64 RESISTANT Resistant     CEFTRIAXONE >=64 RESISTANT Resistant     CIPROFLOXACIN >=4 RESISTANT Resistant     GENTAMICIN <=1 SENSITIVE Sensitive     IMIPENEM <=0.25 SENSITIVE Sensitive     NITROFURANTOIN <=16 SENSITIVE Sensitive     TRIMETH/SULFA >=320 RESISTANT Resistant     AMPICILLIN/SULBACTAM 4 SENSITIVE Sensitive     PIP/TAZO <=4 SENSITIVE Sensitive     Extended ESBL POSITIVE Resistant     * >=100,000 COLONIES/mL ESCHERICHIA COLI         Radiology Studies: No results found.      Scheduled Meds: . atenolol  50 mg Oral BID  . divalproex  500 mg Oral QHS  . enoxaparin (LOVENOX) injection  40 mg Subcutaneous Q24H  . Influenza vac split quadrivalent PF  0.5 mL Intramuscular Tomorrow-1000  . insulin aspart  0-15 Units Subcutaneous TID WC  . insulin aspart  0-5 Units Subcutaneous QHS  . insulin glargine  30 Units Subcutaneous QHS  . latanoprost  1 drop Both Eyes QHS  . levothyroxine  200 mcg Oral QHS  . lisinopril  10 mg Oral QHS  . morphine  15 mg  Oral Daily  . pneumococcal 23 valent vaccine  0.5 mL Intramuscular Tomorrow-1000  . pregabalin  75 mg Oral BID   Continuous Infusions: . ertapenem Stopped (01/10/17 1405)     LOS: 2 days    Time spent: 30 minutes    Edwin Dada, MD [facility] Pager [phone]  If 7PM-7AM, please contact night-coverage [email or web address] Password [facility] 01/10/2017, 6:31 PM

## 2017-01-11 LAB — CBC
HCT: 30.6 % — ABNORMAL LOW (ref 36.0–46.0)
Hemoglobin: 10.4 g/dL — ABNORMAL LOW (ref 12.0–15.0)
MCH: 30.3 pg (ref 26.0–34.0)
MCHC: 34 g/dL (ref 30.0–36.0)
MCV: 89.2 fL (ref 78.0–100.0)
PLATELETS: 257 10*3/uL (ref 150–400)
RBC: 3.43 MIL/uL — AB (ref 3.87–5.11)
RDW: 13.2 % (ref 11.5–15.5)
WBC: 7 10*3/uL (ref 4.0–10.5)

## 2017-01-11 LAB — BASIC METABOLIC PANEL
ANION GAP: 6 (ref 5–15)
BUN: 9 mg/dL (ref 6–20)
CALCIUM: 8.4 mg/dL — AB (ref 8.9–10.3)
CO2: 27 mmol/L (ref 22–32)
Chloride: 104 mmol/L (ref 101–111)
Creatinine, Ser: 0.78 mg/dL (ref 0.44–1.00)
Glucose, Bld: 269 mg/dL — ABNORMAL HIGH (ref 65–99)
POTASSIUM: 4.4 mmol/L (ref 3.5–5.1)
Sodium: 137 mmol/L (ref 135–145)

## 2017-01-11 LAB — GLUCOSE, CAPILLARY
GLUCOSE-CAPILLARY: 250 mg/dL — AB (ref 65–99)
GLUCOSE-CAPILLARY: 211 mg/dL — AB (ref 65–99)
GLUCOSE-CAPILLARY: 141 mg/dL — AB (ref 65–99)
GLUCOSE-CAPILLARY: 172 mg/dL — AB (ref 65–99)

## 2017-01-11 NOTE — Clinical Social Work Note (Signed)
Clinical Social Work Assessment  Patient Details  Name: Andrea [name] MRN: [id number] Date of Birth: 09/20/1950  Date of referral:  01/11/17               Reason for consult:  Discharge Planning, Facility Placement                Permission sought to share information with:  Family Supports Permission granted to share information::  Yes, Verbal Permission Granted  Name::     Christopher Viverette  Agency::     Relationship::  son  Contact Information:  [phone]  Housing/Transportation Living arrangements for the past 2 months:  Apartment Source of Information:  Patient Patient Interpreter Needed:  None Criminal Activity/Legal Involvement Pertinent to Current Situation/Hospitalization:  No - Comment as needed Significant Relationships:  Adult Children, Other Family Members Lives with:  Self Do you feel safe going back to the place where you live?  Yes Need for family participation in patient care:  No (Coment)  Care giving concerns:  No family at bedside. Patient stated her son and daughter in law are super supportive and are able to meet her needs.   Social Worker assessment / plan:  CSW met patient at bedside to discuss discharge options. Patient stated that the plan is for her to discharge from the hospital and stay with son and daughter in law in their home.Pateint stated that son and daughter in law works but will be home at night and they will check in on patient. Patient refused SNF stating that she does not want to go to a nursing home. Patient stated her sister was at a SNF in the past and she almost went crazy. Patient stated she would prefer home health upon discharge. Patient gave CSW verbal permission to contact patients son (Chris) to verify patients statement.    CSW spoke to  Chris via phone. Chris stated that patient will be moving into his home come January 1st but chris stated his having a hard time convincing his wife to let his mother move into the home  before then. Chris stated patient will go back to her home and he will check in on her periodically unitl he can move her into his home January 1st.   Employment status:  Retired Insurance information:  Managed Medicare PT Recommendations:  Home with Home Health Information / Referral to community resources:  Skilled Nursing Facility  Patient/Family's Response to care:  Son supportive of patient   Patient/Family's Understanding of and Emotional Response to Diagnosis, Current Treatment, and Prognosis:  Patient agreeable to do pt with home health   Emotional Assessment Appearance:  Appears stated age Attitude/Demeanor/Rapport:  Other Affect (typically observed):  Accepting, Pleasant Orientation:  Oriented to Self, Oriented to Place, Oriented to  Time, Oriented to Situation Alcohol / Substance use:  Not Applicable Psych involvement (Current and /or in the community):  No (Comment)  Discharge Needs  Concerns to be addressed:  No discharge needs identified Readmission within the last 30 days:  No Current discharge risk:  None Barriers to Discharge:  No Barriers Identified    C , LCSW 01/11/2017, 4:31 PM  

## 2017-01-11 NOTE — Progress Notes (Signed)
Patient wishes to go home with home health. She will be staying with her son. Ambulated with walker in [place] today, supervised by PT. Home health will need to be set up in the morning by case manager.

## 2017-01-11 NOTE — Progress Notes (Signed)
Patient has become more confused from baseline. She is alert and oriented to person only. Patient couldn't tell me where she was, why she came to the hospital, or what today's date was. Patient has been saying things that doesn't make sense such as stating when asked a question by nurse "I was saying the alphabet" and "what happened to that bacon that was right there" I replied right where and she pointed to her hand. Patient has also vomited 3 times within the last hour of green content. Notified MD. MD Kennon Holter returned page. Orders placed. Will continue to monitor.

## 2017-01-11 NOTE — Progress Notes (Signed)
[facility] notified that patient has bp 177/73 dinamap. 180/80 manual patient is asymptomatic. Arthor Captain LPN

## 2017-01-11 NOTE — Progress Notes (Signed)
Physical Therapy Treatment Patient Details Name: Andrea [name] MRN: [id number] DOB: Apr 05, 1950 Today's Date: 01/11/2017    History of Present Illness Pt is a 66 yo female admitted 01/07/17 with c/o weakness and multiple falls in last 2 weeks, having fallen the day before admission and now with rhabdomyolysis and leukocytosis, tachycardia, tachypnea and hyperglycemia; worked up for UTI. PMHx: DM, HTN, low thyroid and chronic pain, epilepsy.    PT Comments    Pt has progressed well with mobility. Mod indep with transfers and ambulation using RW, demonstrating improved safety awareness throughout session. Pt plans to d/c home with son's family who will be available for PRN support. Discussed this plan with son on phone who is in agreement. Pt has met short-term PT goals. All education completed and questions answered. From a mobility perspective, feel pt is safe to return to son's home with use of her rollator and HHPT services. D/c recs updated to reflect this. Will d/c acute PT.   Follow Up Recommendations  Home health PT;Supervision - Intermittent     Equipment Recommendations  None recommended by PT    Recommendations for Other Services       Precautions / Restrictions Precautions Precautions: Fall Restrictions Weight Bearing Restrictions: No    Mobility  Bed Mobility Overal bed mobility: Modified Independent       Supine to sit: HOB elevated;Modified independent (Device/Increase time)        Transfers Overall transfer level: Needs assistance Equipment used: Rolling walker (2 wheeled) Transfers: Sit to/from Stand Sit to Stand: Modified independent (Device/Increase time)         General transfer comment: Demonstrates appropriate safety awareness and technique with RW  Ambulation/Gait   Ambulation Distance (Feet): 250 Feet Assistive device: Rolling walker (2 wheeled) Gait Pattern/deviations: Step-through pattern;Decreased stride length Gait velocity:  Decreased   General Gait Details: Demonstrates appropriate safety awareness and technique with RW   Stairs            Wheelchair Mobility    Modified Rankin (Stroke Patients Only)       Balance Overall balance assessment: History of Falls;Needs assistance Sitting-balance support: Feet supported;Single extremity supported Sitting balance-Leahy Scale: Good     Standing balance support: Bilateral upper extremity supported;During functional activity;No upper extremity supported Standing balance-Leahy Scale: Fair Standing balance comment: Can static stand with no UE support                            Cognition Arousal/Alertness: Awake/alert Behavior During Therapy: WFL for tasks assessed/performed Overall Cognitive Status: No family/caregiver present to determine baseline cognitive functioning                                 General Comments: Slow to answer some questions, but overall seems [facility]      Exercises      General Comments General comments (skin integrity, edema, etc.): Spoke with son on phone about updating d/c recs to HHPT      Pertinent Vitals/Pain Pain Assessment: Faces Faces Pain Scale: No hurt    Home Living                      Prior Function            PT Goals (current goals can now be found in the care plan section) Progress towards PT goals: Goals met/education completed, patient discharged  from PT    Frequency    Min 3X/week      PT Plan Discharge plan needs to be updated;Frequency needs to be updated    Co-evaluation              AM-PAC PT "6 Clicks" Daily Activity  Outcome Measure  Difficulty turning over in bed (including adjusting bedclothes, sheets and blankets)?: None Difficulty moving from lying on back to sitting on the side of the bed? : None Difficulty sitting down on and standing up from a chair with arms (e.g., wheelchair, bedside commode, etc,.)?: None Help needed moving to  and from a bed to chair (including a wheelchair)?: None Help needed walking in hospital room?: None Help needed climbing 3-5 steps with a railing? : A Little 6 Click Score: 23    End of Session Equipment Utilized During Treatment: Gait belt Activity Tolerance: Patient tolerated treatment well Patient left: in bed;with call bell/phone within reach Nurse Communication: Mobility status PT Visit Diagnosis: Unsteadiness on feet (R26.81);Repeated falls (R29.6);Muscle weakness (generalized) (M62.81)     Time: 2111-7356 PT Time Calculation (min) (ACUTE ONLY): 20 min  Charges:  $Gait Training: 8-22 mins                    G Codes:      Mabeline Caras, PT, DPT Acute Rehab Services  Pager: [place] 01/11/2017, 3:15 PM

## 2017-01-11 NOTE — Progress Notes (Signed)
Neuro check done. Patient still alert and oriented to self only. DisorientedX3.

## 2017-01-11 NOTE — Progress Notes (Signed)
Spoke w nurse, she is to request PT to re-eval patient for immanent DC to assess functional capacity and safety.

## 2017-01-11 NOTE — Progress Notes (Signed)
PROGRESS NOTE    Andrea [name]  IZT:[id number] DOB: Jul 20, 1950 DOA: 01/07/2017 PCP: Medicine, [facility]      Brief Narrative:  66 yo F with chronic pain, IDDM, hypothyroidism, HTN who presents with progressive weakness, falls over 2 weeks.  On arrival, noted to have had a fall and prolonged period down yesterday, CK level suggested non-traumatic rhabdomyolysis, and also found to have leukocytosis, tachycardia, tachypnea, and hyperglycemia.  Given fluids, started on IV antibiotics and admitted for further management.   Assessment & Plan:  Active Problems:   Hypothyroid   Epilepsy ([place])   UTI (urinary tract infection)   Frequent falls   Rhabdomyolysis   Chronic pain    Generalized weakness Multifactorial from dehydration, UTI, hypothyroidism.   -PT eval recommended SNF initially, now recommends HHPT  UTI/pyelonephritis ESBL bacteria Culture showing ESBL.  Discussed with ID, they recommend 3 days Invanz at least, and if stable, can finish with one dose fosfomycin. -Continue Invanz, last dose tomorrow -Fosfomycin x1 on Monday   Rhabdomyloysis Resolved.  Chronic pain -Continue Norco -Continue Lyrica -Continue Xanax -Continue Morphine combo  Hypothyroidism TSH up.  She states she takes her medicines 2-3 times per week.  Also has no idea to take this on an empty stomach.  Her son was already concerned about her ability to manage her medical care and other household responsibilities and she was planning to move in with him after Jan 1.   -Continue home levothyroxine 200 mcg daily -Repeat TSH in 1 month  Hypertension -Continue lisinopril, atenolol  Seizures -Continue Depakote  Diabetes -Continue glargine -SSI     DVT prophylaxis: Lovenox Code Status: FULL Family Communication: Son by phone Disposition Plan: Given ESBL UTI with presentation weak, with leukocytosis and tachycardia and falls, will give IV Invanz.  Will need at  least 3 days Invanz, then if not full 7 days, finish with fosfomycin PO.      Consultants:   None  Procedures:   Noneo  Antimicrobials:   Meropenem 12/18 >>12/19  Ciprofloxacin 12/19 >> 12/21  Invanz 12/21 >>    Subjective: No significant change.  Still weak, not sure she can walk around at home.  No new fever, chills, vomiting, cough, sputum, dyspnea, chest pain.  Objective: Vitals:   01/10/17 2117 01/11/17 0556 01/11/17 0637 01/11/17 1538  BP: (!) 162/64 (!) 177/73 (!) 180/80 (!) 161/76  Pulse: 80 75  79  Resp: 16 18  18   Temp: 98.5 F (36.9 C) 98.5 F (36.9 C)  99 F (37.2 C)  TempSrc: Oral Oral  Oral  SpO2: 94% 97%  95%  Weight:      Height:        Intake/Output Summary (Last 24 hours) at 01/11/2017 1817 Last data filed at 01/11/2017 1446 Gross per 24 hour  Intake 560 ml  Output 1000 ml  Net -440 ml   Filed Weights   01/08/17 1428 01/09/17 0700 01/10/17 0500  Weight: 64.4 kg (142 lb) 68 kg (149 lb 14.6 oz) 68 kg (149 lb 14.6 oz)    Examination: General appearance: Overweight adult female, awake, in no acute distress. Tired.     HEENT: Anicteric, conjunctiva pink, lids and lashes normal. No nasal deformity, discharge, epistaxis.  Lips moist.   Skin: Pale dry. No peau dorange on legs.  No jaundice.  No suspicious rashes or lesions. Cardiac: RRR, nl S1-S2, no murmurs appreciated.  Capillary refill is brisk.  JVP not visible.  No LE edema.  Radial pulses 2+  and symmetric. Respiratory: Normal respiratory rate and rhythm.  CTAB without rales or wheezes. Abdomen: Abdomen soft.  Moderate suprapubic TTP, improved. No ascites, distension, hepatosplenomegaly.   MSK: No deformities or effusions. Neuro: Awake and alert.  EOMI, moves all extremities. Speech fluent.    Psych: Sensorium intact and responding to questions, attention normal. Affect blutned.  Judgment and insight appear normal.    Data Reviewed: I have personally reviewed following labs and imaging  studies:  CBC: Recent Labs  Lab 01/07/17 1656 01/08/17 0422 01/09/17 1148 01/11/17 0636  WBC 12.3* 11.9* 7.8 7.0  NEUTROABS  --  6.6  --   --   HGB 13.7 12.1 10.7* 10.4*  HCT 39.9 35.4* 31.5* 30.6*  MCV 89.9 89.4 89.0 89.2  PLT 315 293 270 938   Basic Metabolic Panel: Recent Labs  Lab 01/07/17 1656 01/08/17 0422 01/09/17 1148 01/10/17 0630 01/11/17 0636  NA 134* 135 136 140 137  K 4.0 3.6 4.1 4.3 4.4  CL 97* 101 107 110 104  CO2 25 25 25 26 27   GLUCOSE 417* 291* 206* 168* 269*  BUN 19 19 11 7 9   CREATININE 0.96 0.87 0.64 0.60 0.78  CALCIUM 9.2 8.2* 7.8* 8.1* 8.4*   GFR: Estimated Creatinine Clearance: 66.4 mL/min (by C-G formula based on SCr of 0.78 mg/dL). Liver Function Tests: Recent Labs  Lab 01/08/17 0422  AST 36  ALT 21  ALKPHOS 66  BILITOT 0.7  PROT 6.0*  ALBUMIN 2.8*   No results for input(s): LIPASE, AMYLASE in the last 168 hours. No results for input(s): AMMONIA in the last 168 hours. Coagulation Profile: No results for input(s): INR, PROTIME in the last 168 hours. Cardiac Enzymes: Recent Labs  Lab 01/07/17 2348 01/08/17 0422 01/09/17 1047 01/10/17 0630  CKTOTAL 1,457* 855* 313* 179   BNP (last 3 results) No results for input(s): PROBNP in the last 8760 hours. HbA1C: No results for input(s): HGBA1C in the last 72 hours. CBG: Recent Labs  Lab 01/10/17 1644 01/10/17 2114 01/11/17 0746 01/11/17 1235 01/11/17 1653  GLUCAP 297* 215* 250* 211* 141*   Lipid Profile: No results for input(s): CHOL, HDL, LDLCALC, TRIG, CHOLHDL, LDLDIRECT in the last 72 hours. Thyroid Function Tests: Recent Labs    01/09/17 1148  FREET4 0.86   Anemia Panel: No results for input(s): VITAMINB12, FOLATE, FERRITIN, TIBC, IRON, RETICCTPCT in the last 72 hours. Urine analysis:    Component Value Date/Time   COLORURINE YELLOW 01/08/2017 0147   APPEARANCEUR CLOUDY (A) 01/08/2017 0147   LABSPEC 1.024 01/08/2017 0147   PHURINE 5.0 01/08/2017 0147    GLUCOSEU >=500 (A) 01/08/2017 0147   HGBUR SMALL (A) 01/08/2017 0147   BILIRUBINUR NEGATIVE 01/08/2017 0147   KETONESUR NEGATIVE 01/08/2017 0147   PROTEINUR 100 (A) 01/08/2017 0147   UROBILINOGEN 1.0 12/02/2013 1612   NITRITE NEGATIVE 01/08/2017 0147   LEUKOCYTESUR MODERATE (A) 01/08/2017 0147   Sepsis Labs: @LABRCNTIP (procalcitonin:4,lacticidven:4)  ) Recent Results (from the past 240 hour(s))  Urine culture     Status: Abnormal   Collection Time: 01/08/17  2:23 AM  Result Value Ref Range Status   Specimen Description URINE, RANDOM  Final   Special Requests NONE  Final   Culture (A)  Final    >=100,000 COLONIES/mL ESCHERICHIA COLI Confirmed Extended Spectrum Beta-Lactamase Producer (ESBL).  In bloodstream infections from ESBL organisms, carbapenems are preferred over piperacillin/tazobactam. They are shown to have a lower risk of mortality.    Report Status 01/10/2017 FINAL  Final  Organism ID, Bacteria ESCHERICHIA COLI (A)  Final      Susceptibility   Escherichia coli - MIC*    AMPICILLIN >=32 RESISTANT Resistant     CEFAZOLIN >=64 RESISTANT Resistant     CEFTRIAXONE >=64 RESISTANT Resistant     CIPROFLOXACIN >=4 RESISTANT Resistant     GENTAMICIN <=1 SENSITIVE Sensitive     IMIPENEM <=0.25 SENSITIVE Sensitive     NITROFURANTOIN <=16 SENSITIVE Sensitive     TRIMETH/SULFA >=320 RESISTANT Resistant     AMPICILLIN/SULBACTAM 4 SENSITIVE Sensitive     PIP/TAZO <=4 SENSITIVE Sensitive     Extended ESBL POSITIVE Resistant     * >=100,000 COLONIES/mL ESCHERICHIA COLI         Radiology Studies: No results found.      Scheduled Meds: . atenolol  50 mg Oral BID  . divalproex  500 mg Oral QHS  . enoxaparin (LOVENOX) injection  40 mg Subcutaneous Q24H  . insulin aspart  0-15 Units Subcutaneous TID WC  . insulin aspart  0-5 Units Subcutaneous QHS  . insulin glargine  30 Units Subcutaneous QHS  . latanoprost  1 drop Both Eyes QHS  . levothyroxine  200 mcg Oral QHS  .  lisinopril  10 mg Oral QHS  . morphine  15 mg Oral Daily  . pregabalin  75 mg Oral BID   Continuous Infusions: . ertapenem Stopped (01/11/17 1132)     LOS: 3 days    Time spent: 30 minutes    Edwin Dada, MD [facility] Pager [phone]  If 7PM-7AM, please contact night-coverage [email or web address] Password [facility] 01/11/2017, 6:17 PM

## 2017-01-12 ENCOUNTER — Telehealth: Payer: Self-pay | Admitting: *Deleted

## 2017-01-12 DIAGNOSIS — N3000 Acute cystitis without hematuria: Secondary | ICD-10-CM

## 2017-01-12 LAB — COMPREHENSIVE METABOLIC PANEL
ALT: 23 U/L (ref 14–54)
ANION GAP: 9 (ref 5–15)
AST: 38 U/L (ref 15–41)
Albumin: 2.3 g/dL — ABNORMAL LOW (ref 3.5–5.0)
Alkaline Phosphatase: 61 U/L (ref 38–126)
BILIRUBIN TOTAL: 0.8 mg/dL (ref 0.3–1.2)
BUN: 6 mg/dL (ref 6–20)
CHLORIDE: 100 mmol/L — AB (ref 101–111)
CO2: 25 mmol/L (ref 22–32)
Calcium: 8.3 mg/dL — ABNORMAL LOW (ref 8.9–10.3)
Creatinine, Ser: 0.68 mg/dL (ref 0.44–1.00)
GFR calc Af Amer: >60 mL/min (ref >60)
Glucose, Bld: 137 mg/dL — ABNORMAL HIGH (ref 65–99)
POTASSIUM: 4.5 mmol/L (ref 3.5–5.1)
Sodium: 134 mmol/L — ABNORMAL LOW (ref 135–145)
TOTAL PROTEIN: 5.6 g/dL — AB (ref 6.5–8.1)

## 2017-01-12 LAB — CBC WITH DIFFERENTIAL/PLATELET
BASOS PCT: 0 %
Basophils Absolute: 0 10*3/uL (ref 0.0–0.1)
EOS PCT: 0 %
Eosinophils Absolute: 0.1 10*3/uL (ref 0.0–0.7)
HEMATOCRIT: 32.9 % — AB (ref 36.0–46.0)
Hemoglobin: 11.5 g/dL — ABNORMAL LOW (ref 12.0–15.0)
LYMPHS PCT: 30 %
Lymphs Abs: 3.5 10*3/uL (ref 0.7–4.0)
MCH: 31 pg (ref 26.0–34.0)
MCHC: 35 g/dL (ref 30.0–36.0)
MCV: 88.7 fL (ref 78.0–100.0)
MONOS PCT: 11 %
Monocytes Absolute: 1.3 10*3/uL — ABNORMAL HIGH (ref 0.1–1.0)
NEUTROS ABS: 7 10*3/uL (ref 1.7–7.7)
Neutrophils Relative %: 59 %
PLATELETS: 269 10*3/uL (ref 150–400)
RBC: 3.71 MIL/uL — ABNORMAL LOW (ref 3.87–5.11)
RDW: 13.3 % (ref 11.5–15.5)
WBC: 11.9 10*3/uL — ABNORMAL HIGH (ref 4.0–10.5)

## 2017-01-12 LAB — GLUCOSE, CAPILLARY
GLUCOSE-CAPILLARY: 133 mg/dL — AB (ref 65–99)
GLUCOSE-CAPILLARY: 120 mg/dL — AB (ref 65–99)

## 2017-01-12 MED ORDER — LEVOTHYROXINE SODIUM 200 MCG PO TABS: 200.0000 ug | ORAL_TABLET | Freq: Every day | ORAL | 0 refills | Status: AC

## 2017-01-12 MED ORDER — FOSFOMYCIN TROMETHAMINE 3 G PO PACK: 3.0000 g | PACK | Freq: Once | ORAL | 0 refills | Status: AC

## 2017-01-12 NOTE — Progress Notes (Signed)
Neuro check done. Patient alert and oriented to self. She knew she was at the hospital but didn't know city and state. Disoriented to time and situation.

## 2017-01-12 NOTE — Progress Notes (Signed)
Pt discharged to home with her son.  Discharge instructions given.

## 2017-01-12 NOTE — Telephone Encounter (Signed)
Would like to use [facility] for [facility] and PT which I have arranged with Jermaine. No DME needs. Pt will be staying with Luvenia Redden @ [address] [place], [place] [address] and Pandora Leiter can be reached @ phone number on file. No further CM needs at this time.

## 2017-01-12 NOTE — Progress Notes (Signed)
Called pt's son, Gerald Stabs at [facility] to make sure all home care had been arranged.  He stated it was and they would anticipate a call tomorrow from [facility] to arrange PT.

## 2017-01-12 NOTE — Progress Notes (Signed)
Pt called back up to the dept after discharge and stated did not see HHPT set up.  Contacted case mgr Jeannie Crutchfield about the case.  Pt prefers [facility] for provider.  Pt will be going to son's house at address:  [place], [place]  [address].  Son's # is ;  [phone] and patient's # is [phone].  Pt states she needs PT for strengthening.  Instructed pt to call me back if had not heard anything by 1700 today so I can follow up and make sure that HHPT is set up.

## 2017-01-13 NOTE — Discharge Summary (Signed)
[facility] Discharge Summary   Patient: Andrea [name] OBS:[id number]   PCP: [facility], [facility] DOB: Jun 15, 1950   Date of admission: 01/07/2017   Date of discharge: 01/12/2017    Discharge Diagnoses:  Active Problems:   Hypothyroid   Epilepsy ([place])   UTI (urinary tract infection)   Frequent falls   Rhabdomyolysis   Chronic pain   Admitted From: home Disposition:  Home with home health  Recommendations for Outpatient Follow-up:  1. Follow-up with PCP in 1 week.  Follow-up Information    Medicine, [facility]. Schedule an appointment as soon as possible for a visit in 1 week(s).   Specialty:  Family Medicine Contact information: [place] [phone] [place], [facility] Follow up.   Specialty:  Home Health Services Why:  HHPT and RN has been ordered by your MD and will be provided by above. A rep will be in touch with you within 24 hours  to arrange initial visit.  Contact information: [address] [place] [address] [phone]          Diet recommendation: Cardiac diet  Activity: The patient is advised to gradually reintroduce usual activities.  Discharge Condition: good  Code Status: Full code  History of present illness: As per the H and P dictated on admission, "Andrea [name] is a 66 y.o. female with medical history significant for chronic pain on opiates, insulin-dependent diabetes mellitus, hypothyroidism, and hypertension, now presenting to the emergency department for evaluation of generalized weakness and frequent falls.  Patient reports that for the past 2 weeks, she has had increasing difficulty ambulating and has had recurrent falls secondary to this.  She describes a generalized weakness, but also notes that the right leg is particularly weak and she usually falls when trying to step onto her right foot.  She had a fall yesterday  evening onto her right side and now presents with severe pain in the right hip.  She has been able to bear weight and ambulate after the injury.  Denies hitting her head or losing consciousness.  Denies any change in vision or hearing.  No numbness.  ED Course: Upon arrival to the ED, patient is found to be afebrile, saturating well on room air, tachycardic in the low 100s, and with blood pressure stable.  EKG features a sinus tachycardia with rate 108.  Chest x-ray is negative for acute cardiopulmonary disease.  Radiographs of the right hip are negative for acute fracture or dislocation.  Lumbar spine radiographs were performed with no acute abnormalities.  Chemistry panel is notable for a glucose of 417.  CBC features a leukocytosis 12,300.  Urinalysis is suggestive of infection and sample will be sent for culture.  Serum CK is elevated to 1457.  Patient was treated with ciprofloxacin, 2 L of normal saline, and Percocet in the emergency department.  Tachycardia resolved, blood pressure remained stable, and the patient will be admitted to the medical-surgical unit for ongoing evaluation and management weakness with recurrent falls UTI, and rhabdomyolysis.  "  Hospital Course:  Summary of her active problems in the hospital is as following. Generalized weakness Multifactorial from dehydration, UTI, hypothyroidism.  -PT eval recommended SNF initially, now recommends HHPT  UTI/pyelonephritis ESBL bacteria Culture showing ESBL.  Discussed with ID, they recommend 3 days Invanz at least, and if stable, can finish with one dose fosfomycin. Treated with 3 days of Invanz,  -  Fosfomycin x1 on Monday  Rhabdomyloysis Resolved.  Chronic pain -ContinueNorco -ContinueLyrica -ContinueXanax -ContinueMorphine combo  Hypothyroidism TSH up. She states she takes her medicines 2-3 times per week.  Also has no idea to take this on an empty stomach.  Her son was already concerned about her ability to  manage her medical care and other household responsibilities and she was planning to move in with him after Jan 1.   -Continue home levothyroxine 200 mcg daily -Repeat TSH in 1 month  Hypertension -Continue lisinopril, atenolol  Seizures -Continue Depakote  Diabetes -Continuehome regimen  All other chronic medical condition were stable during the hospitalization.  Patient was seen by physical therapy, who recommended home health, which was arranged by Education officer, museum and case Freight forwarder. On the day of the discharge the patient's vitals were stable, and no other acute medical condition were reported by patient. the patient was felt safe to be discharge at home with home health.  Procedures and Results:  none   Consultations:  none  DISCHARGE MEDICATION: Allergies as of 01/12/2017      Reactions   Aspirin Nausea And Vomiting   Maxzide [hydrochlorothiazide W-triamterene] Swelling   Fluid retention   Metformin And Related Diarrhea   Nsaids Nausea And Vomiting   Other Other (See Comments)   All steroids produce psychosis per pt Artificial sweeteners produce nausea and upset stomach.   Pravachol [pravastatin] Other (See Comments)   unknown   Stadol [butorphanol] Other (See Comments)   Toradol, etc And related- hallucinations   Toradol [ketorolac Tromethamine] Other (See Comments)   hallucinations   Vistaril [hydroxyzine Hcl] Other (See Comments)   unknown   Erythromycin Itching, Rash   Morphine And Related Hives, Rash   Penicillins Hives, Itching, Rash   Has patient had a PCN reaction causing immediate rash, facial/tongue/throat swelling, SOB or lightheadedness with hypotension: yes Has patient had a PCN reaction causing severe rash involving mucus membranes or skin necrosis: unknown Has patient had a PCN reaction that required hospitalization : unknown Has patient had a PCN reaction occurring within the last 10 years: no If all of the above answers are "NO", then may  proceed with Cephalosporin use.      Medication List    TAKE these medications   ALPRAZolam 0.5 MG tablet Commonly known as:  XANAX TAKE 1 TABLET BY MOUTH 4 TIMES A DAY AS NEEDED FOR ANXIETY   atenolol 50 MG tablet Commonly known as:  TENORMIN Take 50 mg by mouth 2 (two) times daily.   divalproex 500 MG DR tablet Commonly known as:  DEPAKOTE Take 500 mg by mouth at bedtime.   EMBEDA 20-0.8 MG Cpcr Generic drug:  Morphine-Naltrexone Take 1 tablet by mouth daily.   fosfomycin 3 g Pack Commonly known as:  MONUROL Take 3 g by mouth once for 1 dose.   insulin glargine 100 UNIT/ML injection Commonly known as:  LANTUS Inject 30 Units into the skin daily.   latanoprost 0.005 % ophthalmic solution Commonly known as:  XALATAN Place 1 drop into both eyes at bedtime.   levothyroxine 200 MCG tablet Commonly known as:  SYNTHROID, LEVOTHROID Take 1 tablet (200 mcg total) by mouth at bedtime.   lisinopril 10 MG tablet Commonly known as:  PRINIVIL,ZESTRIL Take 10 mg by mouth at bedtime.   miconazole 100 MG vaginal suppository Commonly known as:  MICOTIN Place 100 mg vaginally as needed (Yeast infection).   ondansetron 4 MG/2ML Soln injection Commonly known as:  ZOFRAN Inject 2 mLs (  4 mg total) every 6 (six) hours as needed into the vein for nausea or vomiting.   pregabalin 75 MG capsule Commonly known as:  LYRICA Take 1 capsule (75 mg total) 2 (two) times daily by mouth.   promethazine 25 MG suppository Commonly known as:  PHENERGAN Place 1 suppository (25 mg total) rectally every 6 (six) hours as needed for nausea or vomiting.      Allergies  Allergen Reactions  . Aspirin Nausea And Vomiting  . Maxzide [Hydrochlorothiazide W-Triamterene] Swelling    Fluid retention  . Metformin And Related Diarrhea  . Nsaids Nausea And Vomiting  . Other Other (See Comments)    All steroids produce psychosis per pt Artificial sweeteners produce nausea and upset stomach.  Gregary Cromer [Pravastatin] Other (See Comments)    unknown  . Stadol [Butorphanol] Other (See Comments)    Toradol, etc And related- hallucinations  . Toradol [Ketorolac Tromethamine] Other (See Comments)    hallucinations  . Vistaril [Hydroxyzine Hcl] Other (See Comments)    unknown  . Erythromycin Itching and Rash  . Morphine And Related Hives and Rash  . Penicillins Hives, Itching and Rash    Has patient had a PCN reaction causing immediate rash, facial/tongue/throat swelling, SOB or lightheadedness with hypotension: yes Has patient had a PCN reaction causing severe rash involving mucus membranes or skin necrosis: unknown Has patient had a PCN reaction that required hospitalization : unknown Has patient had a PCN reaction occurring within the last 10 years: no If all of the above answers are "NO", then may proceed with Cephalosporin use.    Discharge Instructions    Diet - low sodium heart healthy   Complete by:  As directed    Discharge instructions   Complete by:  As directed    It is important that you read following instructions as well as go over your medication list with RN to help you understand your care after this hospitalization.  Discharge Instructions: Please follow-up with PCP in one week  Please request your primary care physician to go over all Hospital Tests and Procedure/Radiological results at the follow up,  Please get all Hospital records sent to your PCP by signing hospital release before you go home.   Do not take more than prescribed Pain, Sleep and Anxiety Medications. You were cared for by a hospitalist during your hospital stay. If you have any questions about your discharge medications or the care you received while you were in the hospital after you are discharged, you can call the unit and ask to speak with the hospitalist on call if the hospitalist that took care of you is not available.  Once you are discharged, your primary care physician will handle  any further medical issues. Please note that NO REFILLS for any discharge medications will be authorized once you are discharged, as it is imperative that you return to your primary care physician (or establish a relationship with a primary care physician if you do not have one) for your aftercare needs so that they can reassess your need for medications and monitor your lab values. You Must read complete instructions/literature along with all the possible adverse reactions/side effects for all the Medicines you take and that have been prescribed to you. Take any new Medicines after you have completely understood and accept all the possible adverse reactions/side effects. Wear Seat belts while driving. If you have smoked or chewed Tobacco in the last 2 yrs please stop smoking and/or stop any  Recreational drug use.   Increase activity slowly   Complete by:  As directed      Discharge Exam: Filed Weights   01/08/17 1428 01/09/17 0700 01/10/17 0500  Weight: 64.4 kg (142 lb) 68 kg (149 lb 14.6 oz) 68 kg (149 lb 14.6 oz)   Vitals:   01/11/17 2129 01/12/17 0656  BP: (!) 188/71 (!) 173/68  Pulse: 87 64  Resp: 18 20  Temp: 99.9 F (37.7 C) 100.2 F (37.9 C)  SpO2: 98% 94%   General: Appear in no distress, no Rash; Oral Mucosa moist. Cardiovascular: S1 and S2 Present, no Murmur, no JVD Respiratory: Bilateral Air entry present and Clear to Auscultation, no Crackles, no wheezes Abdomen: Bowel Sound present, Soft and no tenderness Extremities: no Pedal edema, o calf tenderness Neurology: Grossly no focal neuro deficit.  The results of significant diagnostics from this hospitalization (including imaging, microbiology, ancillary and laboratory) are listed below for reference.    Significant Diagnostic Studies: Dg Chest 1 View  Result Date: 01/08/2017 CLINICAL DATA:  Pain following fall EXAM: CHEST 1 VIEW COMPARISON:  July 17, 2016 FINDINGS: No edema or consolidation. Heart size and pulmonary  vascularity are normal. No adenopathy. No pneumothorax. No evident bone lesions. IMPRESSION: No edema or consolidation. Electronically Signed   By: Lowella Grip III M.D.   On: 01/08/2017 00:00   Dg Lumbar Spine Complete  Result Date: 01/08/2017 CLINICAL DATA:  Back pain EXAM: LUMBAR SPINE - COMPLETE 4+ VIEW COMPARISON:  CT abdomen pelvis 11/22/2016 Lumbar spine radiograph 09/12/2016 FINDINGS: Unchanged chronic compression deformity of L3. No acute abnormality. Alignment is normal. Mild multilevel facet arthrosis. Mild upper lumbar degenerative disc disease. IMPRESSION: No acute abnormality of the lumbar spine. Unchanged chronic compression deformity of L3. Electronically Signed   By: Ulyses Jarred M.D.   On: 01/08/2017 00:15   Ct Head Wo Contrast  Result Date: 01/08/2017 CLINICAL DATA:  Fall.  Ataxia. EXAM: CT HEAD WITHOUT CONTRAST TECHNIQUE: Contiguous axial images were obtained from the base of the skull through the vertex without intravenous contrast. COMPARISON:  Head CT 12/03/2013 FINDINGS: Brain: No mass lesion, intraparenchymal hemorrhage or extra-axial collection. No evidence of acute cortical infarct. There is periventricular hypoattenuation compatible with chronic microvascular disease. 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 Vascular: No hyperdense vessel or unexpected calcification. Skull: Normal visualized skull base, calvarium and extracranial soft tissues. Sinuses/Orbits: No sinus fluid levels or advanced mucosal thickening. No mastoid effusion. Normal orbits. IMPRESSION: Chronic microvascular ischemia and old lacunar infarcts without acute intracranial abnormality. Electronically Signed   By: Ulyses Jarred M.D.   On: 01/08/2017 04:51   Dg Hip Unilat With Pelvis 2-3 Views Right  Result Date: 01/07/2017 CLINICAL DATA:  Fall EXAM: DG HIP (WITH OR WITHOUT PELVIS) 2-3V RIGHT COMPARISON:  None. FINDINGS: Mild degenerative spurring in the hip joints bilaterally. SI joints are  symmetric and unremarkable. No acute bony abnormality. Specifically, no fracture, subluxation, or dislocation. Soft tissues are intact. IMPRESSION: No acute bony abnormality. Electronically Signed   By: Rolm Baptise M.D.   On: 01/07/2017 17:35    Microbiology: Recent Results (from the past 240 hour(s))  Urine culture     Status: Abnormal   Collection Time: 01/08/17  2:23 AM  Result Value Ref Range Status   Specimen Description URINE, RANDOM  Final   Special Requests NONE  Final   Culture (A)  Final    >=100,000 COLONIES/mL ESCHERICHIA COLI Confirmed Extended Spectrum Beta-Lactamase Producer (ESBL).  In bloodstream infections from ESBL organisms,  carbapenems are preferred over piperacillin/tazobactam. They are shown to have a lower risk of mortality.    Report Status 01/10/2017 FINAL  Final   Organism ID, Bacteria ESCHERICHIA COLI (A)  Final      Susceptibility   Escherichia coli - MIC*    AMPICILLIN >=32 RESISTANT Resistant     CEFAZOLIN >=64 RESISTANT Resistant     CEFTRIAXONE >=64 RESISTANT Resistant     CIPROFLOXACIN >=4 RESISTANT Resistant     GENTAMICIN <=1 SENSITIVE Sensitive     IMIPENEM <=0.25 SENSITIVE Sensitive     NITROFURANTOIN <=16 SENSITIVE Sensitive     TRIMETH/SULFA >=320 RESISTANT Resistant     AMPICILLIN/SULBACTAM 4 SENSITIVE Sensitive     PIP/TAZO <=4 SENSITIVE Sensitive     Extended ESBL POSITIVE Resistant     * >=100,000 COLONIES/mL ESCHERICHIA COLI     Labs: CBC: Recent Labs  Lab 01/07/17 1656 01/08/17 0422 01/09/17 1148 01/11/17 0636 01/12/17 0508  WBC 12.3* 11.9* 7.8 7.0 11.9*  NEUTROABS  --  6.6  --   --  7.0  HGB 13.7 12.1 10.7* 10.4* 11.5*  HCT 39.9 35.4* 31.5* 30.6* 32.9*  MCV 89.9 89.4 89.0 89.2 88.7  PLT 315 293 270 257 503   Basic Metabolic Panel: Recent Labs  Lab 01/08/17 0422 01/09/17 1148 01/10/17 0630 01/11/17 0636 01/12/17 0508  NA 135 136 140 137 134*  K 3.6 4.1 4.3 4.4 4.5  CL 101 107 110 104 100*  CO2 25 25 26 27 25     GLUCOSE 291* 206* 168* 269* 137*  BUN 19 11 7 9 6   CREATININE 0.87 0.64 0.60 0.78 0.68  CALCIUM 8.2* 7.8* 8.1* 8.4* 8.3*   Liver Function Tests: Recent Labs  Lab 01/08/17 0422 01/12/17 0508  AST 36 38  ALT 21 23  ALKPHOS 66 61  BILITOT 0.7 0.8  PROT 6.0* 5.6*  ALBUMIN 2.8* 2.3*   No results for input(s): LIPASE, AMYLASE in the last 168 hours. No results for input(s): AMMONIA in the last 168 hours. Cardiac Enzymes: Recent Labs  Lab 01/07/17 2348 01/08/17 0422 01/09/17 1047 01/10/17 0630  CKTOTAL 1,457* 855* 313* 179   BNP (last 3 results) No results for input(s): BNP in the last 8760 hours. CBG: Recent Labs  Lab 01/11/17 1235 01/11/17 1653 01/11/17 2125 01/12/17 0852 01/12/17 1250  GLUCAP 211* 141* 172* 133* 120*   Time spent: 35 minutes  Signed:  Berle Mull  [facility] 01/12/2017 , 5:30 PM

## 2017-02-17 ENCOUNTER — Inpatient Hospital Stay (HOSPITAL_COMMUNITY)
Admission: EM | Admit: 2017-02-17 | Discharge: 2017-02-24 | DRG: 392 | Disposition: A | Discharge status: Home / Self Care | Payer: Medicare Other | Attending: Family Medicine | Admitting: Family Medicine

## 2017-02-17 ENCOUNTER — Encounter (HOSPITAL_COMMUNITY): Payer: Self-pay | Admitting: *Deleted

## 2017-02-17 ENCOUNTER — Emergency Department (HOSPITAL_COMMUNITY): Payer: Medicare Other

## 2017-02-17 ENCOUNTER — Other Ambulatory Visit: Payer: Self-pay

## 2017-02-17 DIAGNOSIS — G8929 Other chronic pain: Secondary | ICD-10-CM | POA: Diagnosis present

## 2017-02-17 DIAGNOSIS — Z880 Allergy status to penicillin: Secondary | ICD-10-CM

## 2017-02-17 DIAGNOSIS — Z9049 Acquired absence of other specified parts of digestive tract: Secondary | ICD-10-CM

## 2017-02-17 DIAGNOSIS — K5732 Diverticulitis of large intestine without perforation or abscess without bleeding: Principal | ICD-10-CM | POA: Diagnosis present

## 2017-02-17 DIAGNOSIS — F419 Anxiety disorder, unspecified: Secondary | ICD-10-CM | POA: Diagnosis present

## 2017-02-17 DIAGNOSIS — Z881 Allergy status to other antibiotic agents status: Secondary | ICD-10-CM

## 2017-02-17 DIAGNOSIS — Z794 Long term (current) use of insulin: Secondary | ICD-10-CM

## 2017-02-17 DIAGNOSIS — E039 Hypothyroidism, unspecified: Secondary | ICD-10-CM | POA: Diagnosis present

## 2017-02-17 DIAGNOSIS — Z79899 Other long term (current) drug therapy: Secondary | ICD-10-CM

## 2017-02-17 DIAGNOSIS — I10 Essential (primary) hypertension: Secondary | ICD-10-CM | POA: Diagnosis present

## 2017-02-17 DIAGNOSIS — R1030 Lower abdominal pain, unspecified: Secondary | ICD-10-CM | POA: Diagnosis not present

## 2017-02-17 DIAGNOSIS — K219 Gastro-esophageal reflux disease without esophagitis: Secondary | ICD-10-CM | POA: Diagnosis present

## 2017-02-17 DIAGNOSIS — Z886 Allergy status to analgesic agent status: Secondary | ICD-10-CM

## 2017-02-17 DIAGNOSIS — Z885 Allergy status to narcotic agent status: Secondary | ICD-10-CM

## 2017-02-17 DIAGNOSIS — Z79890 Hormone replacement therapy (postmenopausal): Secondary | ICD-10-CM

## 2017-02-17 DIAGNOSIS — Z888 Allergy status to other drugs, medicaments and biological substances status: Secondary | ICD-10-CM

## 2017-02-17 DIAGNOSIS — Z853 Personal history of malignant neoplasm of breast: Secondary | ICD-10-CM

## 2017-02-17 DIAGNOSIS — Z87898 Personal history of other specified conditions: Secondary | ICD-10-CM

## 2017-02-17 DIAGNOSIS — R112 Nausea with vomiting, unspecified: Secondary | ICD-10-CM | POA: Diagnosis present

## 2017-02-17 DIAGNOSIS — K5792 Diverticulitis of intestine, part unspecified, without perforation or abscess without bleeding: Secondary | ICD-10-CM

## 2017-02-17 DIAGNOSIS — G40909 Epilepsy, unspecified, not intractable, without status epilepticus: Secondary | ICD-10-CM | POA: Diagnosis present

## 2017-02-17 DIAGNOSIS — E119 Type 2 diabetes mellitus without complications: Secondary | ICD-10-CM | POA: Diagnosis present

## 2017-02-17 DIAGNOSIS — Z90710 Acquired absence of both cervix and uterus: Secondary | ICD-10-CM

## 2017-02-17 DIAGNOSIS — IMO0001 Reserved for inherently not codable concepts without codable children: Secondary | ICD-10-CM

## 2017-02-17 LAB — URINALYSIS, ROUTINE W REFLEX MICROSCOPIC
Bilirubin Urine: NEGATIVE
GLUCOSE, UA: 50 mg/dL — AB
HGB URINE DIPSTICK: NEGATIVE
KETONES UR: 20 mg/dL — AB
NITRITE: NEGATIVE
PH: 5 (ref 5.0–8.0)
Protein, ur: >=300 mg/dL — AB
Specific Gravity, Urine: 1.023 (ref 1.005–1.030)

## 2017-02-17 LAB — I-STAT CHEM 8, ED
BUN: 15 mg/dL (ref 6–20)
CALCIUM ION: 1.18 mmol/L (ref 1.15–1.40)
Chloride: 96 mmol/L — ABNORMAL LOW (ref 101–111)
Creatinine, Ser: 0.8 mg/dL (ref 0.44–1.00)
Glucose, Bld: 218 mg/dL — ABNORMAL HIGH (ref 65–99)
HEMATOCRIT: 46 % (ref 36.0–46.0)
Hemoglobin: 15.6 g/dL — ABNORMAL HIGH (ref 12.0–15.0)
POTASSIUM: 3.6 mmol/L (ref 3.5–5.1)
SODIUM: 139 mmol/L (ref 135–145)
TCO2: 31 mmol/L (ref 22–32)

## 2017-02-17 LAB — CBC WITH DIFFERENTIAL/PLATELET
Basophils Absolute: 0 10*3/uL (ref 0.0–0.1)
Basophils Relative: 0 %
EOS PCT: 1 %
Eosinophils Absolute: 0.2 10*3/uL (ref 0.0–0.7)
HEMATOCRIT: 41 % (ref 36.0–46.0)
Hemoglobin: 14.5 g/dL (ref 12.0–15.0)
LYMPHS PCT: 28 %
Lymphs Abs: 4.1 10*3/uL — ABNORMAL HIGH (ref 0.7–4.0)
MCH: 30.7 pg (ref 26.0–34.0)
MCHC: 35.4 g/dL (ref 30.0–36.0)
MCV: 86.7 fL (ref 78.0–100.0)
MONO ABS: 1 10*3/uL (ref 0.1–1.0)
MONOS PCT: 7 %
NEUTROS ABS: 9.5 10*3/uL — AB (ref 1.7–7.7)
Neutrophils Relative %: 64 %
PLATELETS: 344 10*3/uL (ref 150–400)
RBC: 4.73 MIL/uL (ref 3.87–5.11)
RDW: 13 % (ref 11.5–15.5)
WBC: 14.8 10*3/uL — ABNORMAL HIGH (ref 4.0–10.5)

## 2017-02-17 LAB — I-STAT CG4 LACTIC ACID, ED: LACTIC ACID, VENOUS: 1.09 mmol/L (ref 0.5–1.9)

## 2017-02-17 LAB — COMPREHENSIVE METABOLIC PANEL
ALT: 16 U/L (ref 14–54)
ANION GAP: 11 (ref 5–15)
AST: 22 U/L (ref 15–41)
Albumin: 3.8 g/dL (ref 3.5–5.0)
Alkaline Phosphatase: 76 U/L (ref 38–126)
BILIRUBIN TOTAL: 0.4 mg/dL (ref 0.3–1.2)
BUN: 15 mg/dL (ref 6–20)
CHLORIDE: 96 mmol/L — AB (ref 101–111)
CO2: 30 mmol/L (ref 22–32)
Calcium: 9.6 mg/dL (ref 8.9–10.3)
Creatinine, Ser: 0.78 mg/dL (ref 0.44–1.00)
Glucose, Bld: 219 mg/dL — ABNORMAL HIGH (ref 65–99)
Potassium: 3.6 mmol/L (ref 3.5–5.1)
Sodium: 137 mmol/L (ref 135–145)
TOTAL PROTEIN: 8.4 g/dL — AB (ref 6.5–8.1)

## 2017-02-17 LAB — I-STAT TROPONIN, ED: TROPONIN I, POC: 0.01 ng/mL (ref 0.00–0.08)

## 2017-02-17 LAB — GLUCOSE, CAPILLARY: GLUCOSE-CAPILLARY: 135 mg/dL — AB (ref 65–99)

## 2017-02-17 MED ORDER — SODIUM CHLORIDE 0.9 % IV SOLN
INTRAVENOUS | Status: AC
  Administered 2017-02-17: 23:00:00 via INTRAVENOUS

## 2017-02-17 MED ORDER — OXYCODONE-ACETAMINOPHEN 5-325 MG PO TABS
1.0000 | ORAL_TABLET | Freq: Three times a day (TID) | ORAL | Status: DC | PRN
  Administered 2017-02-18 – 2017-02-24 (×15): 1 via ORAL
  Filled 2017-02-17 (×17): qty 1

## 2017-02-17 MED ORDER — ATENOLOL 50 MG PO TABS
50.0000 mg | ORAL_TABLET | Freq: Two times a day (BID) | ORAL | Status: DC
  Administered 2017-02-18 – 2017-02-24 (×14): 50 mg via ORAL
  Filled 2017-02-17 (×15): qty 1

## 2017-02-17 MED ORDER — PREGABALIN 75 MG PO CAPS
75.0000 mg | ORAL_CAPSULE | Freq: Two times a day (BID) | ORAL | Status: DC
  Administered 2017-02-18 – 2017-02-24 (×14): 75 mg via ORAL
  Filled 2017-02-17 (×14): qty 1

## 2017-02-17 MED ORDER — SODIUM CHLORIDE 0.9 % IV BOLUS (SEPSIS)
1000.0000 mL | Freq: Once | INTRAVENOUS | Status: AC
  Administered 2017-02-17: 1000 mL via INTRAVENOUS

## 2017-02-17 MED ORDER — ONDANSETRON HCL 4 MG/2ML IJ SOLN
4.0000 mg | Freq: Four times a day (QID) | INTRAMUSCULAR | Status: DC | PRN
  Administered 2017-02-19 – 2017-02-24 (×10): 4 mg via INTRAVENOUS
  Filled 2017-02-17 (×10): qty 2

## 2017-02-17 MED ORDER — LORAZEPAM 2 MG/ML IJ SOLN: 1.0000 mg | INTRAMUSCULAR | Status: DC | PRN

## 2017-02-17 MED ORDER — PROMETHAZINE HCL 25 MG RE SUPP
25.0000 mg | Freq: Four times a day (QID) | RECTAL | Status: DC | PRN
  Administered 2017-02-19 – 2017-02-22 (×2): 25 mg via RECTAL
  Filled 2017-02-17 (×3): qty 1

## 2017-02-17 MED ORDER — ALPRAZOLAM 0.5 MG PO TABS
0.5000 mg | ORAL_TABLET | Freq: Four times a day (QID) | ORAL | Status: DC | PRN
  Administered 2017-02-18 – 2017-02-23 (×10): 0.5 mg via ORAL
  Filled 2017-02-17 (×10): qty 1

## 2017-02-17 MED ORDER — PREGABALIN 50 MG PO CAPS: 75.0000 mg | ORAL_CAPSULE | Freq: Two times a day (BID) | ORAL | Status: DC

## 2017-02-17 MED ORDER — ACETAMINOPHEN 325 MG PO TABS
650.0000 mg | ORAL_TABLET | Freq: Four times a day (QID) | ORAL | Status: DC | PRN
  Administered 2017-02-19 – 2017-02-20 (×2): 650 mg via ORAL
  Filled 2017-02-17 (×2): qty 2

## 2017-02-17 MED ORDER — OXYCODONE HCL 5 MG PO TABS
5.0000 mg | ORAL_TABLET | Freq: Three times a day (TID) | ORAL | Status: DC | PRN
  Administered 2017-02-18 – 2017-02-24 (×17): 5 mg via ORAL
  Filled 2017-02-17 (×18): qty 1

## 2017-02-17 MED ORDER — CEFTRIAXONE SODIUM 1 G IJ SOLR
1.0000 g | Freq: Once | INTRAMUSCULAR | Status: AC
  Administered 2017-02-17: 1 g via INTRAVENOUS
  Filled 2017-02-17: qty 10

## 2017-02-17 MED ORDER — INSULIN GLARGINE 100 UNIT/ML ~~LOC~~ SOLN
30.0000 [IU] | Freq: Every day | SUBCUTANEOUS | Status: DC
  Administered 2017-02-18 – 2017-02-23 (×7): 30 [IU] via SUBCUTANEOUS
  Filled 2017-02-17 (×8): qty 0.3

## 2017-02-17 MED ORDER — ACETAMINOPHEN 650 MG RE SUPP: 650.0000 mg | Freq: Four times a day (QID) | RECTAL | Status: DC | PRN

## 2017-02-17 MED ORDER — DIVALPROEX SODIUM 250 MG PO DR TAB
500.0000 mg | DELAYED_RELEASE_TABLET | Freq: Every day | ORAL | Status: DC
  Administered 2017-02-18 – 2017-02-23 (×7): 500 mg via ORAL
  Filled 2017-02-17 (×7): qty 2

## 2017-02-17 MED ORDER — MORPHINE-NALTREXONE 20-0.8 MG PO CPCR: 1.0000 | ORAL_CAPSULE | Freq: Every day | ORAL | Status: DC

## 2017-02-17 MED ORDER — INSULIN ASPART 100 UNIT/ML ~~LOC~~ SOLN
0.0000 [IU] | Freq: Three times a day (TID) | SUBCUTANEOUS | Status: DC
  Administered 2017-02-18 – 2017-02-20 (×3): 2 [IU] via SUBCUTANEOUS
  Administered 2017-02-21: 1 [IU] via SUBCUTANEOUS
  Administered 2017-02-21: 2 [IU] via SUBCUTANEOUS
  Administered 2017-02-22 – 2017-02-23 (×2): 3 [IU] via SUBCUTANEOUS
  Administered 2017-02-23 – 2017-02-24 (×4): 2 [IU] via SUBCUTANEOUS

## 2017-02-17 MED ORDER — IOPAMIDOL (ISOVUE-300) INJECTION 61%
INTRAVENOUS | Status: AC
  Administered 2017-02-17: 100 mL
  Filled 2017-02-17: qty 100

## 2017-02-17 MED ORDER — ONDANSETRON HCL 4 MG PO TABS
4.0000 mg | ORAL_TABLET | Freq: Four times a day (QID) | ORAL | Status: DC | PRN
Start: 2017-02-17 — End: 2017-02-24

## 2017-02-17 MED ORDER — HEPARIN SODIUM (PORCINE) 5000 UNIT/ML IJ SOLN
5000.0000 [IU] | Freq: Three times a day (TID) | INTRAMUSCULAR | Status: DC
  Administered 2017-02-18 – 2017-02-24 (×20): 5000 [IU] via SUBCUTANEOUS
  Filled 2017-02-17 (×23): qty 1

## 2017-02-17 MED ORDER — LISINOPRIL 10 MG PO TABS
10.0000 mg | ORAL_TABLET | Freq: Every day | ORAL | Status: DC
  Administered 2017-02-18 – 2017-02-19 (×3): 10 mg via ORAL
  Filled 2017-02-17 (×4): qty 1

## 2017-02-17 MED ORDER — ONDANSETRON HCL 4 MG/2ML IJ SOLN
4.0000 mg | Freq: Once | INTRAMUSCULAR | Status: AC
  Administered 2017-02-17: 4 mg via INTRAVENOUS
  Filled 2017-02-17: qty 2

## 2017-02-17 MED ORDER — OXYCODONE-ACETAMINOPHEN 5-325 MG PO TABS
2.0000 | ORAL_TABLET | Freq: Once | ORAL | Status: AC
  Administered 2017-02-17: 2 via ORAL
  Filled 2017-02-17: qty 2

## 2017-02-17 MED ORDER — ALPRAZOLAM 0.5 MG PO TABS
0.5000 mg | ORAL_TABLET | Freq: Once | ORAL | Status: AC
  Administered 2017-02-17: 0.5 mg via ORAL
  Filled 2017-02-17: qty 1

## 2017-02-17 MED ORDER — OXYCODONE-ACETAMINOPHEN 10-325 MG PO TABS: 1.0000 | ORAL_TABLET | Freq: Three times a day (TID) | ORAL | Status: DC | PRN

## 2017-02-17 MED ORDER — FENTANYL CITRATE (PF) 100 MCG/2ML IJ SOLN
50.0000 ug | Freq: Once | INTRAMUSCULAR | Status: AC
  Administered 2017-02-21: 50 ug via INTRAVENOUS
  Filled 2017-02-17: qty 2

## 2017-02-17 MED ORDER — SODIUM CHLORIDE 0.9 % IV SOLN
1.0000 g | Freq: Once | INTRAVENOUS | Status: AC
  Administered 2017-02-18: 1 g via INTRAVENOUS
  Filled 2017-02-17 (×2): qty 1

## 2017-02-17 MED ORDER — LEVOTHYROXINE SODIUM 100 MCG PO TABS
200.0000 ug | ORAL_TABLET | Freq: Every day | ORAL | Status: DC
  Administered 2017-02-18 – 2017-02-23 (×7): 200 ug via ORAL
  Filled 2017-02-17: qty 2
  Filled 2017-02-17: qty 1
  Filled 2017-02-17 (×6): qty 2

## 2017-02-17 NOTE — H&P (Signed)
[facility] History and Physical  Andrea [name] GLO:[id number] DOB: [birth year] DOA: 02/17/2017  Referring physician:  PCP: [facility]   Chief Complaint: "My stomach hurts."  HPI: Andrea [name] is a 67 y.o. female past medical history significant for breast cancer, diabetes, reflux, hypertension, low thyroid, migraines seizure presents the emergency room with abdominal pain.  Note patient was treated for diverticulitis recently.  Patient did not feel like she got better.  Appetite never normalized.  Has some mild abdominal pain.  Saw her primary care doctor in follow-up 2 weeks ago.  She is at that time.  She gradually worsened after that.  Came to the ED due to pain.  Patient is unsure what antibiotic she took at that time.  ED course: CT of abdomen pelvis showed diverticulitis.  Patient started on meropenem.  Patient admitted to the hospitalist service.   Review of Systems:  As per HPI otherwise 10 point review of systems negative.    Past Medical History:  Diagnosis Date  . Breast cancer ([place])   . Diabetes mellitus without complication ([place])   . Esophageal reflux   . Hypertension   . Hypothyroid   . Migraine   . Seizure [facility])    Past Surgical History:  Procedure Laterality Date  . ABDOMINAL HYSTERECTOMY    . APPENDECTOMY    . CHOLECYSTECTOMY    . EYE SURGERY  08/28/2015   Right eye  . KNEE SURGERY Left   . MASTECTOMY     bilateral   Social History:  reports that  has never smoked. she has never used smokeless tobacco. She reports that she does not drink alcohol or use drugs.  Allergies  Allergen Reactions  . Aspirin Nausea And Vomiting  . Maxzide [Hydrochlorothiazide W-Triamterene] Swelling    Fluid retention  . Metformin And Related Diarrhea  . Nsaids Nausea And Vomiting  . Other Other (See Comments)    All steroids produce psychosis per pt Artificial sweeteners produce nausea and upset stomach.  Gregary Cromer  [Pravastatin] Other (See Comments)    unknown  . Stadol [Butorphanol] Other (See Comments)    Toradol, etc And related- hallucinations  . Toradol [Ketorolac Tromethamine] Other (See Comments)    hallucinations  . Vistaril [Hydroxyzine Hcl] Other (See Comments)    unknown  . Erythromycin Itching and Rash  . Morphine And Related Hives and Rash    Iv morphine.  . Penicillins Hives, Itching and Rash    Has patient had a PCN reaction causing immediate rash, facial/tongue/throat swelling, SOB or lightheadedness with hypotension: yes Has patient had a PCN reaction causing severe rash involving mucus membranes or skin necrosis: unknown Has patient had a PCN reaction that required hospitalization : unknown Has patient had a PCN reaction occurring within the last 10 years: no If all of the above answers are "NO", then may proceed with Cephalosporin use.     Family History  Problem Relation Age of Onset  . Sudden Cardiac Death Neg Hx      Prior to Admission medications   Medication Sig Start Date End Date Taking? Authorizing Provider  ALPRAZolam (XANAX) 0.5 MG tablet TAKE 1 TABLET BY MOUTH 4 TIMES A DAY AS NEEDED FOR ANXIETY 11/21/16  Yes [provider]  atenolol (TENORMIN) 50 MG tablet Take 50 mg by mouth 2 (two) times daily.    Yes [provider]  AZO-CRANBERRY PO Take 1 tablet by mouth daily.   Yes [provider]  divalproex (DEPAKOTE) 500  MG DR tablet Take 500 mg by mouth at bedtime.    Yes [provider]  EMBEDA 20-0.8 MG CPCR Take 1 tablet by mouth daily. 10/24/16  Yes [provider]  insulin glargine (LANTUS) 100 UNIT/ML injection Inject 30 Units into the skin daily.    Yes [provider]  levothyroxine (SYNTHROID, LEVOTHROID) 200 MCG tablet Take 1 tablet (200 mcg total) by mouth at bedtime. 01/12/17  Yes Lavina Hamman, MD  lisinopril (PRINIVIL,ZESTRIL) 10 MG tablet Take 10 mg by mouth at bedtime.    Yes [provider]  miconazole (MICOTIN) 100 MG vaginal suppository Place 100 mg vaginally as needed (Yeast infection).   Yes [provider]  ondansetron (ZOFRAN) 4 MG/2ML SOLN injection Inject 2 mLs (4 mg total) every 6 (six) hours as needed into the vein for nausea or vomiting. 11/24/16  Yes Osei-Bonsu, Iona Beard, MD  oxyCODONE-acetaminophen (PERCOCET) 10-325 MG tablet Take 1 tablet by mouth 3 (three) times daily as needed for pain. 02/03/17  Yes [provider]  pregabalin (LYRICA) 75 MG capsule Take 1 capsule (75 mg total) 2 (two) times daily by mouth. 11/24/16  Yes Osei-Bonsu, Iona Beard, MD  promethazine (PHENERGAN) 25 MG suppository Place 1 suppository (25 mg total) rectally every 6 (six) hours as needed for nausea or vomiting. 11/30/15  Yes Frederica Kuster, PA-C   Physical Exam: Vitals:   02/17/17 1500 02/17/17 1634 02/17/17 1900 02/17/17 2149  BP: (!) 187/84 (!) 192/84 (!) 160/80 (!) 142/107  Pulse: 76 99 73 77  Resp: 17 16  16   Temp:    99.1 F (37.3 C)  TempSrc:    Oral  SpO2: 98% 91% 95% 95%  Weight:      Height:        Wt Readings from Last 3 Encounters:  02/17/17 62.6 kg (138 lb)  01/10/17 68 kg (149 lb 14.6 oz)  11/24/16 70.8 kg (156 lb 1.4 oz)    General:  Appears calm and comfortable; A&Ox3 Eyes:  PERRL, EOMI, normal lids, iris ENT:  grossly normal hearing, lips & tongue Neck:  no LAD, masses or thyromegaly Cardiovascular:  RRR, no m/r/g. No LE edema.  Respiratory:  CTA bilaterally, no w/r/r. Normal respiratory effort. Abdomen:  soft, ntnd Skin:  no rash or induration seen on limited exam Musculoskeletal:  grossly normal tone BUE/BLE Psychiatric:  grossly normal mood and affect, speech fluent and appropriate Neurologic:  CN 2-12 grossly intact, moves all extremities in coordinated fashion.          Labs on Admission:  Basic Metabolic Panel: Recent Labs  Lab 02/17/17 0933 02/17/17 1039  NA 137 139  K 3.6 3.6  CL 96* 96*  CO2 30  --   GLUCOSE 219* 218*  BUN  15 15  CREATININE 0.78 0.80  CALCIUM 9.6  --    Liver Function Tests: Recent Labs  Lab 02/17/17 0933  AST 22  ALT 16  ALKPHOS 76  BILITOT 0.4  PROT 8.4*  ALBUMIN 3.8   No results for input(s): LIPASE, AMYLASE in the last 168 hours. No results for input(s): AMMONIA in the last 168 hours. CBC: Recent Labs  Lab 02/17/17 0933 02/17/17 1039  WBC 14.8*  --   NEUTROABS 9.5*  --   HGB 14.5 15.6*  HCT 41.0 46.0  MCV 86.7  --   PLT 344  --    Cardiac Enzymes: No results for input(s): CKTOTAL, CKMB, CKMBINDEX, TROPONINI in the last 168 hours.  BNP (last  3 results) No results for input(s): BNP in the last 8760 hours.  ProBNP (last 3 results) No results for input(s): PROBNP in the last 8760 hours.   Serum creatinine: 0.8 mg/dL 02/17/17 1039 Estimated creatinine clearance: 63.1 mL/min  CBG: No results for input(s): GLUCAP in the last 168 hours.  Radiological Exams on Admission: Dg Chest 2 View  Result Date: 02/17/2017 CLINICAL DATA:  Three days of vomiting and lower abdominal pain. Also dysuria. History of hypertension, diabetes, previous fall with rib fractures, breast malignancy. EXAM: CHEST  2 VIEW COMPARISON:  Portable chest x-ray of January 07, 2017 FINDINGS: The lungs are adequately inflated and clear. The heart and pulmonary vascularity are normal. The mediastinum is normal in width. There are bilateral breast implants present. There is mild loss of height of the bodies of T8 and T9 which is stable. IMPRESSION: There is no active cardiopulmonary disease. Electronically Signed   By: David  Martinique M.D.   On: 02/17/2017 10:15   Ct Abdomen Pelvis W Contrast  Result Date: 02/17/2017 CLINICAL DATA:  Abdominal pain, vomiting EXAM: CT ABDOMEN AND PELVIS WITH CONTRAST TECHNIQUE: Multidetector CT imaging of the abdomen and pelvis was performed using the standard protocol following bolus administration of intravenous contrast. CONTRAST:  170mL ISOVUE-300 IOPAMIDOL (ISOVUE-300)  INJECTION 61% COMPARISON:  None. FINDINGS: Lower chest: Bilateral breast implants partially imaged. Heart is upper limits normal in size. Dependent atelectasis in the lung bases. No effusions. Hepatobiliary: No focal liver abnormality is seen. Status post cholecystectomy. No biliary dilatation. Pancreas: No focal abnormality or ductal dilatation. Spleen: No focal abnormality.  Normal size. Adrenals/Urinary Tract: Left pelvic kidney again noted. Small cysts in the upper pole of the left kidney and midpole of the right kidney. No hydronephrosis. Urinary bladder and adrenal glands unremarkable. Stomach/Bowel: Distal descending colonic and sigmoid diverticulosis. Slight stranding around the distal descending colon compatible with early active diverticulitis. Moderate stool burden in the colon. Stomach and small bowel decompressed, unremarkable. Vascular/Lymphatic: Aortic atherosclerosis. No enlarged abdominal or pelvic lymph nodes. Reproductive: Prior hysterectomy.  No adnexal masses. Other: No free fluid or free air. Musculoskeletal: Mild compression fracture through the superior endplate of L3 is stable since prior study. IMPRESSION: Scattered left colonic diverticulosis. Slight stranding around the distal descending colon suggesting early active diverticulitis. Moderate stool burden in the colon. Left pelvic kidney. Small cysts in the kidneys bilaterally. No hydronephrosis. Prior hysterectomy and cholecystectomy. Electronically Signed   By: Rolm Baptise M.D.   On: 02/17/2017 17:57    EKG: Independently reviewed. NSR. No stemi.  Assessment/Plan Active Problems:   Diverticulitis  Diverticulitis Started on meropenem Patient was to try to eat so we will try a clear liquid diet Advance as tolerated in the morning  Anxiety As needed Xanax  Seizures hx Prn ativan Seizure precautions Cont depakote  DM II Cont glargine SSI AC  HTN Cont lisinopril, tenormin  Hypothyroidism Cont OP synthroid 200  mcg qd No signs of hyper or hypothyroidism  Chronic pain Cont Embeda Prn percocet  Code Status: FC  DVT Prophylaxis: heparin Family Communication: none available Disposition Plan: Pending Improvement  Status: obs medsurg  Elwin Mocha, MD Family Medicine [facility] [email or web address] Password [email or web address]

## 2017-02-17 NOTE — ED Provider Notes (Signed)
Pt seen by Dr Billy Fischer. Please see her note.  CT scan shows early acute uncomplicated diverticulitis.  Pt states she is still hurting too much, does not feel comfortable going home.  Will start add merrem for diverticulitis.  Consult with medical service for admission.   Dorie Rank, MD 02/17/17 Jeri Lager

## 2017-02-17 NOTE — ED Provider Notes (Signed)
[facility] DEPT Provider Note   CSN: [id number] Arrival date & time: 02/17/17  2505     History   Chief Complaint Chief Complaint  Patient presents with  . Emesis  . Abdominal Pain    HPI Andrea [name] is a 67 y.o. female.  HPI Vomiting since Friday, from 5-7 times per day, will gag then start vomiting No diarrhea Abdominal pain lower abdomen, aching, sharp Dysuria since Wednesday, blood in urine, tried azo Blood sugar up since Friday  Noted some aching substernal chest pain today on ROS as well as cough No known fevers Has not been able to keep anything down Glucose running high 189  Past Medical History:  Diagnosis Date  . Breast cancer ([place])   . Diabetes mellitus without complication ([place])   . Esophageal reflux   . Hypertension   . Hypothyroid   . Migraine   . Seizure [facility])     Patient Active Problem List   Diagnosis Date Noted  . Diverticulitis 02/17/2017  . Frequent falls 01/08/2017  . Rhabdomyolysis 01/08/2017  . Chronic pain 01/08/2017  . Nausea & vomiting 11/23/2016  . Diarrhea 11/23/2016  . UTI (urinary tract infection) 12/03/2013  . Fall 12/02/2013  . Hypertension   . Breast cancer ([place])   . Diabetes mellitus without complication ([place])   . Migraine   . Hypothyroid   . Epilepsy ([place])   . Esophageal reflux   . Multiple rib fractures 11/28/2013    Past Surgical History:  Procedure Laterality Date  . ABDOMINAL HYSTERECTOMY    . APPENDECTOMY    . CHOLECYSTECTOMY    . EYE SURGERY  08/28/2015   Right eye  . KNEE SURGERY    . MASTECTOMY      OB History    No data available       Home Medications    Prior to Admission medications   Medication Sig Start Date End Date Taking? Authorizing Provider  ALPRAZolam (XANAX) 0.5 MG tablet TAKE 1 TABLET BY MOUTH 4 TIMES A DAY AS NEEDED FOR ANXIETY 11/21/16  Yes [provider]  atenolol (TENORMIN) 50 MG tablet Take 50 mg by mouth 2 (two) times  daily.    Yes [provider]  AZO-CRANBERRY PO Take 1 tablet by mouth daily.   Yes [provider]  divalproex (DEPAKOTE) 500 MG DR tablet Take 500 mg by mouth at bedtime.    Yes [provider]  EMBEDA 20-0.8 MG CPCR Take 1 tablet by mouth daily. 10/24/16  Yes [provider]  insulin glargine (LANTUS) 100 UNIT/ML injection Inject 30 Units into the skin daily.    Yes [provider]  levothyroxine (SYNTHROID, LEVOTHROID) 200 MCG tablet Take 1 tablet (200 mcg total) by mouth at bedtime. 01/12/17  Yes Lavina Hamman, MD  lisinopril (PRINIVIL,ZESTRIL) 10 MG tablet Take 10 mg by mouth at bedtime.    Yes [provider]  miconazole (MICOTIN) 100 MG vaginal suppository Place 100 mg vaginally as needed (Yeast infection).   Yes [provider]  ondansetron (ZOFRAN) 4 MG/2ML SOLN injection Inject 2 mLs (4 mg total) every 6 (six) hours as needed into the vein for nausea or vomiting. 11/24/16  Yes Osei-Bonsu, Iona Beard, MD  oxyCODONE-acetaminophen (PERCOCET) 10-325 MG tablet Take 1 tablet by mouth 3 (three) times daily as needed for pain. 02/03/17  Yes [provider]  pregabalin (LYRICA) 75 MG capsule Take 1 capsule (75 mg total) 2 (two) times daily by mouth. 11/24/16  Yes Osei-Bonsu, Iona Beard, MD  promethazine (PHENERGAN) 25 MG suppository Place 1 suppository (25 mg total) rectally every 6 (six) hours as needed for nausea or vomiting. 11/30/15  Yes Law, Bea Graff, PA-C    Family History Family History  Problem Relation Age of Onset  . Sudden Cardiac Death Neg Hx     Social History Social History   Tobacco Use  . Smoking status: Never Smoker  . Smokeless tobacco: Never Used  Substance Use Topics  . Alcohol use: No  . Drug use: No     Allergies   Aspirin; Maxzide [hydrochlorothiazide w-triamterene]; Metformin and related; Nsaids; Other; Pravachol [pravastatin]; Stadol [butorphanol]; Toradol [ketorolac tromethamine]; Vistaril  [hydroxyzine hcl]; Erythromycin; Morphine and related; and Penicillins   Review of Systems Review of Systems  Constitutional: Positive for fatigue. Negative for fever.  HENT: Negative for sore throat.   Eyes: Negative for visual disturbance.  Respiratory: Positive for cough. Negative for shortness of breath.   Cardiovascular: Positive for chest pain.  Gastrointestinal: Positive for abdominal pain, nausea and vomiting. Negative for constipation and diarrhea.  Genitourinary: Positive for dysuria and frequency. Negative for difficulty urinating.  Musculoskeletal: Negative for back pain and neck pain.  Skin: Negative for rash.  Neurological: Negative for syncope and headaches.     Physical Exam Updated Vital Signs BP (!) 192/84   Pulse 99   Temp 98 F (36.7 C) (Oral)   Resp 16   Ht 5\' 5"  (1.651 m)   Wt 62.6 kg (138 lb)   SpO2 91%   BMI 22.96 kg/m   Physical Exam  Constitutional: She is oriented to person, place, and time. She appears well-developed and well-nourished. No distress.  HENT:  Head: Normocephalic and atraumatic.  Eyes: Conjunctivae and EOM are normal.  Neck: Normal range of motion.  Cardiovascular: Normal rate, regular rhythm, normal heart sounds and intact distal pulses. Exam reveals no gallop and no friction rub.  No murmur heard. Pulmonary/Chest: Effort normal and breath sounds normal. No respiratory distress. She has no wheezes. She has no rales.  Abdominal: Soft. She exhibits no distension. There is tenderness (suprapubic). There is no guarding, no tenderness at McBurney's point and negative Murphy's sign.  Musculoskeletal: She exhibits no edema or tenderness.  Neurological: She is alert and oriented to person, place, and time.  Skin: Skin is warm and dry. No rash noted. She is not diaphoretic. No erythema.  Nursing note and vitals reviewed.    ED Treatments / Results  Labs (all labs ordered are listed, but only abnormal results are displayed) Labs  Reviewed  URINALYSIS, ROUTINE W REFLEX MICROSCOPIC - Abnormal; Notable for the following components:      Result Value   Glucose, UA 50 (*)    Ketones, ur 20 (*)    Protein, ur >=300 (*)    Leukocytes, UA TRACE (*)    Bacteria, UA RARE (*)    Squamous Epithelial / LPF 0-5 (*)    All other components within normal limits  CBC WITH DIFFERENTIAL/PLATELET - Abnormal; Notable for the following components:   WBC 14.8 (*)    Neutro Abs 9.5 (*)    Lymphs Abs 4.1 (*)    All other components within normal limits  COMPREHENSIVE METABOLIC PANEL - Abnormal; Notable for the following components:   Chloride 96 (*)    Glucose, Bld 219 (*)    Total Protein 8.4 (*)    All other components within normal limits  I-STAT CHEM 8, ED - Abnormal; Notable for  the following components:   Chloride 96 (*)    Glucose, Bld 218 (*)    Hemoglobin 15.6 (*)    All other components within normal limits  URINE CULTURE  I-STAT CG4 LACTIC ACID, ED  I-STAT TROPONIN, ED  I-STAT CG4 LACTIC ACID, ED    EKG  EKG Interpretation  Date/Time:  Monday February 17 2017 10:43:20 EST Ventricular Rate:  74 PR Interval:    QRS Duration: 87 QT Interval:  402 QTC Calculation: 446 R Axis:   28 Text Interpretation:  Sinus rhythm Baseline wander in lead(s) V6 No significant change since last tracing Confirmed by Gareth Morgan [phone]) on 02/17/2017 11:30:37 AM       Radiology Dg Chest 2 View  Result Date: 02/17/2017 CLINICAL DATA:  Three days of vomiting and lower abdominal pain. Also dysuria. History of hypertension, diabetes, previous fall with rib fractures, breast malignancy. EXAM: CHEST  2 VIEW COMPARISON:  Portable chest x-ray of January 07, 2017 FINDINGS: The lungs are adequately inflated and clear. The heart and pulmonary vascularity are normal. The mediastinum is normal in width. There are bilateral breast implants present. There is mild loss of height of the bodies of T8 and T9 which is stable. IMPRESSION: There is  no active cardiopulmonary disease. Electronically Signed   By: David  Martinique M.D.   On: 02/17/2017 10:15   Ct Abdomen Pelvis W Contrast  Result Date: 02/17/2017 CLINICAL DATA:  Abdominal pain, vomiting EXAM: CT ABDOMEN AND PELVIS WITH CONTRAST TECHNIQUE: Multidetector CT imaging of the abdomen and pelvis was performed using the standard protocol following bolus administration of intravenous contrast. CONTRAST:  183mL ISOVUE-300 IOPAMIDOL (ISOVUE-300) INJECTION 61% COMPARISON:  None. FINDINGS: Lower chest: Bilateral breast implants partially imaged. Heart is upper limits normal in size. Dependent atelectasis in the lung bases. No effusions. Hepatobiliary: No focal liver abnormality is seen. Status post cholecystectomy. No biliary dilatation. Pancreas: No focal abnormality or ductal dilatation. Spleen: No focal abnormality.  Normal size. Adrenals/Urinary Tract: Left pelvic kidney again noted. Small cysts in the upper pole of the left kidney and midpole of the right kidney. No hydronephrosis. Urinary bladder and adrenal glands unremarkable. Stomach/Bowel: Distal descending colonic and sigmoid diverticulosis. Slight stranding around the distal descending colon compatible with early active diverticulitis. Moderate stool burden in the colon. Stomach and small bowel decompressed, unremarkable. Vascular/Lymphatic: Aortic atherosclerosis. No enlarged abdominal or pelvic lymph nodes. Reproductive: Prior hysterectomy.  No adnexal masses. Other: No free fluid or free air. Musculoskeletal: Mild compression fracture through the superior endplate of L3 is stable since prior study. IMPRESSION: Scattered left colonic diverticulosis. Slight stranding around the distal descending colon suggesting early active diverticulitis. Moderate stool burden in the colon. Left pelvic kidney. Small cysts in the kidneys bilaterally. No hydronephrosis. Prior hysterectomy and cholecystectomy. Electronically Signed   By: Rolm Baptise M.D.   On:  02/17/2017 17:57    Procedures Procedures (including critical care time)  Medications Ordered in ED Medications  fentaNYL (SUBLIMAZE) injection 50 mcg (not administered)  sodium chloride 0.9 % bolus 1,000 mL (0 mLs Intravenous Stopped 02/17/17 1410)  ondansetron (ZOFRAN) injection 4 mg (4 mg Intravenous Given 02/17/17 1132)  sodium chloride 0.9 % bolus 1,000 mL (1,000 mLs Intravenous New Bag/Given 02/17/17 1635)  ondansetron (ZOFRAN) injection 4 mg (4 mg Intravenous Given 02/17/17 1635)  ALPRAZolam (XANAX) tablet 0.5 mg (0.5 mg Oral Given 02/17/17 1634)  oxyCODONE-acetaminophen (PERCOCET/ROXICET) 5-325 MG per tablet 2 tablet (2 tablets Oral Given 02/17/17 1634)  cefTRIAXone (ROCEPHIN) 1 g in  dextrose 5 % 50 mL IVPB (0 g Intravenous Stopped 02/17/17 1705)  iopamidol (ISOVUE-300) 61 % injection (100 mLs  Contrast Given 02/17/17 1735)     Initial Impression / Assessment and Plan / ED Course  I have reviewed the triage vital signs and the nursing notes.  Pertinent labs & imaging results that were available during my care of the patient were reviewed by me and considered in my medical decision making (see chart for details).     67 year old female with a history of hypertension, diabetes, breast cancer, migraines presents with concern for nausea, vomiting and dysuria.  Reports symptoms are similar to her prior UTI.  On review of systems, patient also reports chest pain and cough.  Chest x-ray shows no signs of pneumonia.  EKG is without significant changes, and troponin is negative.  Labs show no significant abnormalities.    Given IV fluids and zofran. No further vomiting, but still feels some nausea.  UA returned concerning for possible UTI.  Pt reports still having pain, nausea.   Will order rocephin, additional hydration and zofran, home narcotic medication and CT abdomen/pelvis to eval for diverticulitis. Signed out to Dr. Tomi Bamberger.   Final Clinical Impressions(s) / ED Diagnoses   Final  diagnoses:  Acute diverticulitis    ED Discharge Orders    None       Gareth Morgan, MD 02/17/17 [phone]

## 2017-02-17 NOTE — ED Notes (Signed)
ED TO INPATIENT HANDOFF REPORT  Name/Age/Gender Andrea [name] 67 y.o. female  Code Status    Code Status Orders  (From admission, onward)        Start     Ordered   02/17/17 1926  Full code  Continuous     02/17/17 1927    Code Status History    Date Active Date Inactive Code Status Order ID Comments User Context   01/08/2017 03:54 01/12/2017 18:33 Full Code [id number]  Vianne Bulls, MD ED   11/23/2016 05:27 11/24/2016 23:27 Full Code [id number]  Jani Gravel, MD Inpatient   11/28/2013 18:28 12/06/2013 20:58 Full Code [id number]  Coralie Keens, MD Inpatient    Advance Directive Documentation     Most Recent Value  Type of Advance Directive  Healthcare Power of Attorney, Living will  Pre-existing out of facility DNR order (yellow form or pink MOST form)  No data  "MOST" Form in Place?  No data      Home/SNF/Other Home  Chief Complaint EMESIS  Level of Care/Admitting Diagnosis ED Disposition    ED Disposition Condition Comment   Admit  Hospital Area: [facility] [[id number]]  Level of Care: Med-Surg [16]  Diagnosis: Diverticulitis [017793]  Admitting Physician: Elwin Mocha [[id number]]  Attending Physician: Aggie Moats, Layne Benton [[id number]]  PT Class (Do Not Modify): Observation [104]  PT Acc Code (Do Not Modify): Observation [10022]       Medical History Past Medical History:  Diagnosis Date  . Breast cancer ([place])   . Diabetes mellitus without complication ([place])   . Esophageal reflux   . Hypertension   . Hypothyroid   . Migraine   . Seizure ([place])     Allergies Allergies  Allergen Reactions  . Aspirin Nausea And Vomiting  . Maxzide [Hydrochlorothiazide W-Triamterene] Swelling    Fluid retention  . Metformin And Related Diarrhea  . Nsaids Nausea And Vomiting  . Other Other (See Comments)    All steroids produce psychosis per pt Artificial sweeteners produce nausea and upset stomach.  Gregary Cromer [Pravastatin] Other (See  Comments)    unknown  . Stadol [Butorphanol] Other (See Comments)    Toradol, etc And related- hallucinations  . Toradol [Ketorolac Tromethamine] Other (See Comments)    hallucinations  . Vistaril [Hydroxyzine Hcl] Other (See Comments)    unknown  . Erythromycin Itching and Rash  . Morphine And Related Hives and Rash    Iv morphine.  . Penicillins Hives, Itching and Rash    Has patient had a PCN reaction causing immediate rash, facial/tongue/throat swelling, SOB or lightheadedness with hypotension: yes Has patient had a PCN reaction causing severe rash involving mucus membranes or skin necrosis: unknown Has patient had a PCN reaction that required hospitalization : unknown Has patient had a PCN reaction occurring within the last 10 years: no If all of the above answers are "NO", then may proceed with Cephalosporin use.     IV Location/Drains/Wounds Patient Lines/Drains/Airways Status   Active Line/Drains/Airways    Name:   Placement date:   Placement time:   Site:   Days:   Peripheral IV 02/17/17 Right;Upper Arm   02/17/17    1029    Arm   less than 1          Labs/Imaging Results for orders placed or performed during the hospital encounter of 02/17/17 (from the past 48 hour(s))  CBC with Differential     Status: Abnormal   Collection Time: 02/17/17  9:33  AM  Result Value Ref Range   WBC 14.8 (H) 4.0 - 10.5 K/uL   RBC 4.73 3.87 - 5.11 MIL/uL   Hemoglobin 14.5 12.0 - 15.0 g/dL   HCT 41.0 36.0 - 46.0 %   MCV 86.7 78.0 - 100.0 fL   MCH 30.7 26.0 - 34.0 pg   MCHC 35.4 30.0 - 36.0 g/dL   RDW 13.0 11.5 - 15.5 %   Platelets 344 150 - 400 K/uL   Neutrophils Relative % 64 %   Neutro Abs 9.5 (H) 1.7 - 7.7 K/uL   Lymphocytes Relative 28 %   Lymphs Abs 4.1 (H) 0.7 - 4.0 K/uL   Monocytes Relative 7 %   Monocytes Absolute 1.0 0.1 - 1.0 K/uL   Eosinophils Relative 1 %   Eosinophils Absolute 0.2 0.0 - 0.7 K/uL   Basophils Relative 0 %   Basophils Absolute 0.0 0.0 - 0.1 K/uL   Comprehensive metabolic panel     Status: Abnormal   Collection Time: 02/17/17  9:33 AM  Result Value Ref Range   Sodium 137 135 - 145 mmol/L   Potassium 3.6 3.5 - 5.1 mmol/L   Chloride 96 (L) 101 - 111 mmol/L   CO2 30 22 - 32 mmol/L   Glucose, Bld 219 (H) 65 - 99 mg/dL   BUN 15 6 - 20 mg/dL   Creatinine, Ser 0.78 0.44 - 1.00 mg/dL   Calcium 9.6 8.9 - 10.3 mg/dL   Total Protein 8.4 (H) 6.5 - 8.1 g/dL   Albumin 3.8 3.5 - 5.0 g/dL   AST 22 15 - 41 U/L   ALT 16 14 - 54 U/L   Alkaline Phosphatase 76 38 - 126 U/L   Total Bilirubin 0.4 0.3 - 1.2 mg/dL   GFR calc non Af Amer >60 >60 mL/min   GFR calc Af Amer >60 >60 mL/min    Comment: (NOTE) The eGFR has been calculated using the CKD EPI equation. This calculation has not been validated in all clinical situations. eGFR's persistently <60 mL/min signify possible Chronic Kidney Disease.    Anion gap 11 5 - 15  I-Stat Troponin, ED (not at [facility])     Status: None   Collection Time: 02/17/17 10:38 AM  Result Value Ref Range   Troponin i, poc 0.01 0.00 - 0.08 ng/mL   Comment 3            Comment: Due to the release kinetics of cTnI, a negative result within the first hours of the onset of symptoms does not rule out myocardial infarction with certainty. If myocardial infarction is still suspected, repeat the test at appropriate intervals.   I-Stat Chem 8, ED     Status: Abnormal   Collection Time: 02/17/17 10:39 AM  Result Value Ref Range   Sodium 139 135 - 145 mmol/L   Potassium 3.6 3.5 - 5.1 mmol/L   Chloride 96 (L) 101 - 111 mmol/L   BUN 15 6 - 20 mg/dL   Creatinine, Ser 0.80 0.44 - 1.00 mg/dL   Glucose, Bld 218 (H) 65 - 99 mg/dL   Calcium, Ion 1.18 1.15 - 1.40 mmol/L   TCO2 31 22 - 32 mmol/L   Hemoglobin 15.6 (H) 12.0 - 15.0 g/dL   HCT 46.0 36.0 - 46.0 %  I-Stat CG4 Lactic Acid, ED     Status: None   Collection Time: 02/17/17 10:40 AM  Result Value Ref Range   Lactic Acid, Venous 1.09 0.5 - 1.9 mmol/L  Urinalysis, Routine  w reflex microscopic     Status: Abnormal   Collection Time: 02/17/17 12:12 PM  Result Value Ref Range   Color, Urine YELLOW YELLOW   APPearance CLEAR CLEAR   Specific Gravity, Urine 1.023 1.005 - 1.030   pH 5.0 5.0 - 8.0   Glucose, UA 50 (A) NEGATIVE mg/dL   Hgb urine dipstick NEGATIVE NEGATIVE   Bilirubin Urine NEGATIVE NEGATIVE   Ketones, ur 20 (A) NEGATIVE mg/dL   Protein, ur >=300 (A) NEGATIVE mg/dL   Nitrite NEGATIVE NEGATIVE   Leukocytes, UA TRACE (A) NEGATIVE   RBC / HPF 0-5 0 - 5 RBC/hpf   WBC, UA 6-30 0 - 5 WBC/hpf   Bacteria, UA RARE (A) NONE SEEN   Squamous Epithelial / LPF 0-5 (A) NONE SEEN   Mucus PRESENT    Budding Yeast PRESENT    Dg Chest 2 View  Result Date: 02/17/2017 CLINICAL DATA:  Three days of vomiting and lower abdominal pain. Also dysuria. History of hypertension, diabetes, previous fall with rib fractures, breast malignancy. EXAM: CHEST  2 VIEW COMPARISON:  Portable chest x-ray of January 07, 2017 FINDINGS: The lungs are adequately inflated and clear. The heart and pulmonary vascularity are normal. The mediastinum is normal in width. There are bilateral breast implants present. There is mild loss of height of the bodies of T8 and T9 which is stable. IMPRESSION: There is no active cardiopulmonary disease. Electronically Signed   By: David  Martinique M.D.   On: 02/17/2017 10:15   Ct Abdomen Pelvis W Contrast  Result Date: 02/17/2017 CLINICAL DATA:  Abdominal pain, vomiting EXAM: CT ABDOMEN AND PELVIS WITH CONTRAST TECHNIQUE: Multidetector CT imaging of the abdomen and pelvis was performed using the standard protocol following bolus administration of intravenous contrast. CONTRAST:  116m ISOVUE-300 IOPAMIDOL (ISOVUE-300) INJECTION 61% COMPARISON:  None. FINDINGS: Lower chest: Bilateral breast implants partially imaged. Heart is upper limits normal in size. Dependent atelectasis in the lung bases. No effusions. Hepatobiliary: No focal liver abnormality is seen. Status  post cholecystectomy. No biliary dilatation. Pancreas: No focal abnormality or ductal dilatation. Spleen: No focal abnormality.  Normal size. Adrenals/Urinary Tract: Left pelvic kidney again noted. Small cysts in the upper pole of the left kidney and midpole of the right kidney. No hydronephrosis. Urinary bladder and adrenal glands unremarkable. Stomach/Bowel: Distal descending colonic and sigmoid diverticulosis. Slight stranding around the distal descending colon compatible with early active diverticulitis. Moderate stool burden in the colon. Stomach and small bowel decompressed, unremarkable. Vascular/Lymphatic: Aortic atherosclerosis. No enlarged abdominal or pelvic lymph nodes. Reproductive: Prior hysterectomy.  No adnexal masses. Other: No free fluid or free air. Musculoskeletal: Mild compression fracture through the superior endplate of L3 is stable since prior study. IMPRESSION: Scattered left colonic diverticulosis. Slight stranding around the distal descending colon suggesting early active diverticulitis. Moderate stool burden in the colon. Left pelvic kidney. Small cysts in the kidneys bilaterally. No hydronephrosis. Prior hysterectomy and cholecystectomy. Electronically Signed   By: KRolm BaptiseM.D.   On: 02/17/2017 17:57    Pending Labs Unresulted Labs (From admission, onward)   Start     Ordered   02/18/17 0500  CBC  Tomorrow morning,   R     02/17/17 1927   02/18/17 00300 Basic metabolic panel  Tomorrow morning,   R     02/17/17 1927   02/17/17 1926  CBC  (heparin)  Once,   R    Comments:  Baseline for heparin therapy IF NOT ALREADY DRAWN.  Notify  MD if PLT < 100 K.    02/17/17 1927   02/17/17 1926  Creatinine, serum  (heparin)  Once,   R    Comments:  Baseline for heparin therapy IF NOT ALREADY DRAWN.    02/17/17 1927   02/17/17 0859  Urine culture  STAT,   STAT     02/17/17 0858      Vitals/Pain Today's Vitals   02/17/17 1400 02/17/17 1500 02/17/17 1634 02/17/17 1900  BP:  (!) 189/94 (!) 187/84 (!) 192/84 (!) 160/80  Pulse: 80 76 99 73  Resp: _0 Temp:      TempSrc:      SpO2: 97% 98% 91% 95%  Weight:      Height:      PainSc:        Isolation Precautions No active isolations  Medications Medications  fentaNYL (SUBLIMAZE) injection 50 mcg (not administered)  meropenem (MERREM) 1 g in sodium chloride 0.9 % 100 mL IVPB (not administered)  ALPRAZolam (XANAX) tablet 0.5 mg (not administered)  atenolol (TENORMIN) tablet 50 mg (not administered)  divalproex (DEPAKOTE) DR tablet 500 mg (not administered)  insulin glargine (LANTUS) injection 30 Units (not administered)  Morphine-Naltrexone 20-0.8 MG CPCR 1 tablet (not administered)  levothyroxine (SYNTHROID, LEVOTHROID) tablet 200 mcg (not administered)  lisinopril (PRINIVIL,ZESTRIL) tablet 10 mg (not administered)  oxyCODONE-acetaminophen (PERCOCET) 10-325 MG per tablet 1 tablet (not administered)  pregabalin (LYRICA) capsule 75 mg (not administered)  promethazine (PHENERGAN) suppository 25 mg (not administered)  heparin injection 5,000 Units (not administered)  0.9 %  sodium chloride infusion (not administered)  acetaminophen (TYLENOL) tablet 650 mg (not administered)    Or  acetaminophen (TYLENOL) suppository 650 mg (not administered)  ondansetron (ZOFRAN) tablet 4 mg (not administered)    Or  ondansetron (ZOFRAN) injection 4 mg (not administered)  LORazepam (ATIVAN) injection 1-2 mg (not administered)  insulin aspart (novoLOG) injection 0-9 Units (not administered)  sodium chloride 0.9 % bolus 1,000 mL (0 mLs Intravenous Stopped 02/17/17 1410)  ondansetron (ZOFRAN) injection 4 mg (4 mg Intravenous Given 02/17/17 1132)  sodium chloride 0.9 % bolus 1,000 mL (1,000 mLs Intravenous New Bag/Given 02/17/17 1635)  ondansetron (ZOFRAN) injection 4 mg (4 mg Intravenous Given 02/17/17 1635)  ALPRAZolam (XANAX) tablet 0.5 mg (0.5 mg Oral Given 02/17/17 1634)  oxyCODONE-acetaminophen (PERCOCET/ROXICET)  5-325 MG per tablet 2 tablet (2 tablets Oral Given 02/17/17 1634)  cefTRIAXone (ROCEPHIN) 1 g in dextrose 5 % 50 mL IVPB (0 g Intravenous Stopped 02/17/17 1705)  iopamidol (ISOVUE-300) 61 % injection (100 mLs  Contrast Given 02/17/17 1735)    Mobility walks with device

## 2017-02-17 NOTE — ED Triage Notes (Signed)
Pt complains of emesis and lower abdominal pain for the past 3 days. Pt also complains of burning while urinating. Pt believes she has UTI.

## 2017-02-17 NOTE — Progress Notes (Signed)
A consult was received from an ED physician for Meropenem per pharmacy dosing.  The patient's profile has been reviewed for ht/wt/allergies/indication/available labs. A one time order has been placed for the above antibiotics.  Further antibiotics/pharmacy consults should be ordered by admitting physician if indicated.                       Thank you, Reuel Boom, PharmD, BCPS [phone] 02/17/2017, 6:46 PM

## 2017-02-17 NOTE — ED Notes (Signed)
Patient tried to collect urine sample and was unsuccessful. Patient will try again in a little bit.

## 2017-02-18 DIAGNOSIS — N390 Urinary tract infection, site not specified: Secondary | ICD-10-CM | POA: Diagnosis not present

## 2017-02-18 DIAGNOSIS — Z794 Long term (current) use of insulin: Secondary | ICD-10-CM

## 2017-02-18 DIAGNOSIS — E118 Type 2 diabetes mellitus with unspecified complications: Secondary | ICD-10-CM | POA: Diagnosis not present

## 2017-02-18 DIAGNOSIS — K5792 Diverticulitis of intestine, part unspecified, without perforation or abscess without bleeding: Secondary | ICD-10-CM | POA: Diagnosis not present

## 2017-02-18 LAB — BASIC METABOLIC PANEL
Anion gap: 9 (ref 5–15)
BUN: 10 mg/dL (ref 6–20)
CALCIUM: 8.7 mg/dL — AB (ref 8.9–10.3)
CHLORIDE: 102 mmol/L (ref 101–111)
CO2: 27 mmol/L (ref 22–32)
CREATININE: 0.63 mg/dL (ref 0.44–1.00)
Glucose, Bld: 129 mg/dL — ABNORMAL HIGH (ref 65–99)
Potassium: 3.8 mmol/L (ref 3.5–5.1)
SODIUM: 138 mmol/L (ref 135–145)

## 2017-02-18 LAB — URINE CULTURE: CULTURE: NO GROWTH

## 2017-02-18 LAB — CBC
HCT: 36.7 % (ref 36.0–46.0)
HEMOGLOBIN: 12.6 g/dL (ref 12.0–15.0)
MCH: 29.9 pg (ref 26.0–34.0)
MCHC: 34.3 g/dL (ref 30.0–36.0)
MCV: 87 fL (ref 78.0–100.0)
PLATELETS: 275 10*3/uL (ref 150–400)
RBC: 4.22 MIL/uL (ref 3.87–5.11)
RDW: 13.2 % (ref 11.5–15.5)
WBC: 8.7 10*3/uL (ref 4.0–10.5)

## 2017-02-18 LAB — GLUCOSE, CAPILLARY
GLUCOSE-CAPILLARY: 90 mg/dL (ref 65–99)
GLUCOSE-CAPILLARY: 119 mg/dL — AB (ref 65–99)
Glucose-Capillary: 155 mg/dL — ABNORMAL HIGH (ref 65–99)
Glucose-Capillary: 156 mg/dL — ABNORMAL HIGH (ref 65–99)

## 2017-02-18 MED ORDER — SODIUM CHLORIDE 0.9 % IV SOLN: INTRAVENOUS | Status: AC

## 2017-02-18 MED ORDER — MORPHINE SULFATE ER 15 MG PO TBCR
15.0000 mg | EXTENDED_RELEASE_TABLET | Freq: Two times a day (BID) | ORAL | Status: DC
  Administered 2017-02-18 – 2017-02-24 (×12): 15 mg via ORAL
  Filled 2017-02-18 (×12): qty 1

## 2017-02-18 MED ORDER — SODIUM CHLORIDE 0.9 % IV SOLN
1.0000 g | Freq: Three times a day (TID) | INTRAVENOUS | Status: DC
  Administered 2017-02-18 (×3): 1 g via INTRAVENOUS
  Filled 2017-02-18 (×4): qty 1

## 2017-02-18 NOTE — Progress Notes (Signed)
[facility]  Patient Status: Active (receiving services up to time of hospitalization)  [facility] is providing the following services: RN, PT and MSW  If patient discharges after hours, please call [phone].   Andrea [name] 02/18/2017, 9:20 AM

## 2017-02-18 NOTE — Progress Notes (Signed)
Patient noted active with [facility] for nursing and PT [phone]

## 2017-02-18 NOTE — Progress Notes (Addendum)
Pharmacy Antibiotic Note  Andrea [name] is a 67 y.o. female admitted on 02/17/2017 with diverticulitis.  Pharmacy has been consulted for meropenem dosing.  Patient was hospitalized in December 2018 with pyelonephritis secondary to ESBL-producing E.Coli isolate, treated with carbapenem therapy IV followed by fosfomycin PO.   Patient is PCN-allergic but tolerated cephalosporin (ceftriaxone x 1 in ED 1/28).    U/A from 1/28 showed trace leukocytes,rare bacteria, 6-30 WBC/hpf.  Urine culture from 1/28 reported as no growth, final.  Of note, patient is on valproate and has hx of seizure disorder.  Carbapenems often substantially reduce valproate blood levels.  Recommend:  For now have started meropenem 1 gram IV q8h.  Consider switch to non-carbapenem regimen (e.g., ceftriaxone + metronidazole) for diverticulitis if recurrent UTI is ruled out.  Height: 5\' 5"  (165.1 cm) Weight: 132 lb 4.4 oz (60 kg) IBW/kg (Calculated) : 57  Temp (24hrs), Avg:98.7 F (37.1 C), Min:98 F (36.7 C), Max:99.1 F (37.3 C)  Recent Labs  Lab 02/17/17 0933 02/17/17 1039 02/17/17 1040 02/18/17 0435  WBC 14.8*  --   --  8.7  CREATININE 0.78 0.80  --  0.63  LATICACIDVEN  --   --  1.09  --     Estimated Creatinine Clearance: 63.1 mL/min (by C-G formula based on SCr of 0.63 mg/dL).    Allergies  Allergen Reactions  . Aspirin Nausea And Vomiting  . Maxzide [Hydrochlorothiazide W-Triamterene] Swelling    Fluid retention  . Metformin And Related Diarrhea  . Nsaids Nausea And Vomiting  . Other Other (See Comments)    All steroids produce psychosis per pt Artificial sweeteners produce nausea and upset stomach.  Gregary Cromer [Pravastatin] Other (See Comments)    unknown  . Stadol [Butorphanol] Other (See Comments)    Toradol, etc And related- hallucinations  . Toradol [Ketorolac Tromethamine] Other (See Comments)    hallucinations  . Vistaril [Hydroxyzine Hcl] Other (See Comments)    unknown  .  Erythromycin Itching and Rash  . Morphine And Related Hives and Rash    Iv morphine.  . Penicillins Hives, Itching and Rash    Has patient had a PCN reaction causing immediate rash, facial/tongue/throat swelling, SOB or lightheadedness with hypotension: yes Has patient had a PCN reaction causing severe rash involving mucus membranes or skin necrosis: unknown Has patient had a PCN reaction that required hospitalization : unknown Has patient had a PCN reaction occurring within the last 10 years: no If all of the above answers are "NO", then may proceed with Cephalosporin use.     Antimicrobials this admission: 1/28 >> ceftriaxone x 1 dose 1/29 >> meropenem  Dose adjustments this admission: --  Microbiology results: 1/28 Urine cx:  NG, FINAL  Thank you for allowing pharmacy to be a part of this patient's care.  Clayburn Pert, PharmD, BCPS [phone] 02/18/2017  1:07 PM

## 2017-02-18 NOTE — Progress Notes (Signed)
Attending MD note  Patient was seen, examined,treatment plan was discussed with the PA-S.  I have personally reviewed the clinical findings, lab, imaging studies and management of this patient in detail. I agree with the documentation, as recorded by the PA-S  Patient is 67 year old female with history of insulin-dependent diabetes, remote breast cancer, who was admitted to the hospital with abdominal pain, CT scan on admission was concerning for acute diverticulitis.  Of note, patient was hospitalized in December 2018 with lower abdominal pain and dysuria, she was diagnosed with an ESBL UTI, and reports that abdominal pain at that time was different compared to now.  Current symptoms started mostly over the last 6 days, progressed and she came to the ER.  She is also experiencing UTI type symptoms currently with dysuria.  She has no diarrhea, however has been having nausea and vomiting.  On Exam: Gen. exam: Awake, alert, not in any distress Chest: Good air entry bilaterally, no rhonchi or rales CVS: S1-S2 regular, no murmurs Abdomen: Soft, tender mainly in the left lower quadrant, no rebound, no guarding Neurology: Non-focal Skin: No rash or lesions  Plan  Acute diverticulitis -CT scan on admission showed diverticulitis, no apparent complicating factors, no abscess/perforation -She was placed on meropenem for her UTI as below, continue for now -Continue supportive treatment with a clear liquid diet today, antiemetics as well as pain medications, continue IV fluids -Advance diet as tolerated  ESBL UTI -Surprising finding given the fact that she has not have a history of recurrent urinary tract infections, current urine cultures are pending, continue meropenem for now.  Will narrow as soon as reasonable  Insulin-dependent diabetes mellitus -Continue Lantus and sliding scale, CBG is reasonably controlled today  Hypertension -Continue atenolol and lisinopril, treat pain as he can  attribute to elevated blood pressure  Hypothyroidism -Continue Synthroid  History of epilepsy -Continue Depakote    Rest as below  Ertha Nabor M. Cruzita Lederer, MD [facility] [phone]  If 7PM-7AM, please contact night-coverage [email or web address] Password [email or web address]        PROGRESS NOTE    Lorah Kalina  FGH:[id number] DOB: 1950-12-19 DOA: 02/17/2017 PCP: Medicine, [facility]   Brief Narrative:  Patient is a 67 y.o. y/o female with history of breast cancer, diabetes mellitus, esophageal reflux, UTI, hypothyroidism, HTN, presents to the ED with abdominal pain.  She reports abdominal pain since last December before Christmas and was hospitalized for acute cystitis without hematuria.  She continues to experience abdominal pain and burning sensation during urination post hospitalization. The pain worsen yesterday morning, 10/10, and she was brought to the ER.  CT abdomen shows diverticulitis, and was started on meropenem.    Assessment & Plan:   Active Problems:   Diverticulitis   Diverticulitis -Patient continues to experience 8/10 abdominal pain, urine culture from previous hospitalization showed ESBL E Coli. -Eill continue meropenem, patient is on day 0 -Continue clear liquid diet  Urinary tract infection -Patient continues to experience frequent urination and burning sensation during urination  -Pending current urine culture, continue meropenem as above  -WBC improves and within normal limit, will continue to monitor WBC for sign of infection  T2DM -Blood glucose stable, will continue glucose cover and monitoring with insulin sliding scale   HTN -BP stable, continue follow BP trend -Continue atenolol, lisinopril  Hypothyroidism -Continue levothyroxine  Anxiety -Continue alprazolam prn  Chronic pain -Continue oxycodone prn   DVT prophylaxis: Heparin Code Status:  Full  Family Communication:  No family  at bedside  Disposition Plan:   Home   Consultants:   None  Procedures: Antimicrobials: Meropenem  Subjective: Patient appears calm, alert, oriented. Denies fever/chill, dizziness, chest pain, cough, difficulty breathing, no changes in bowel habits, leg pain.  Reports constant 8/10 abdominal pain in lower quadrants and burning sensation during urination   Objective: Vitals:   02/17/17 2330 02/18/17 0500 02/18/17 0857 02/18/17 0950  BP: (!) 149/84 (!) 174/76 (!) 170/104 (!) 160/96  Pulse: 78 72 74 74  Resp: 18 18 16    Temp: 98.9 F (37.2 C) 98 F (36.7 C)    TempSrc: Oral Oral    SpO2: 98% 99% 95%   Weight:  60 kg (132 lb 4.4 oz)    Height:        Intake/Output Summary (Last 24 hours) at 02/18/2017 1103 Last data filed at 02/18/2017 0600 Gross per 24 hour  Intake 2058.33 ml  Output -  Net 2058.33 ml   Filed Weights   02/17/17 0921 02/18/17 0500  Weight: 62.6 kg (138 lb) 60 kg (132 lb 4.4 oz)    Examination:  General exam: Appears calm and comfortable  E ENT: No icterus, oral mucosa moist. Respiratory system: Clear to auscultation. Respiratory effort normal. No wheezing or crackle Cardiovascular system: S1 & S2 heard, RRR.  No pedal edema. Gastrointestinal system: Abdomen is nondistended, mild tenderness. 7/10 pain upon light palpation in all quadrants Central nervous system: Alert and oriented. No focal neurological deficits. Musculoskeletal/Extremities: No joint deformities  Skin: No rashes, lesions or ulcers. Dry and warm to touch  Data Reviewed: I have personally reviewed following labs and imaging studies  CBC: Recent Labs  Lab 02/17/17 0933 02/17/17 1039 02/18/17 0435  WBC 14.8*  --  8.7  NEUTROABS 9.5*  --   --   HGB 14.5 15.6* 12.6  HCT 41.0 46.0 36.7  MCV 86.7  --  87.0  PLT 344  --  756   Basic Metabolic Panel: Recent Labs  Lab 02/17/17 0933 02/17/17 1039 02/18/17 0435  NA 137 139 138  K 3.6 3.6 3.8  CL 96* 96* 102  CO2 30  --  27  GLUCOSE 219* 218* 129*  BUN 15 15  10   CREATININE 0.78 0.80 0.63  CALCIUM 9.6  --  8.7*   GFR: Estimated Creatinine Clearance: 63.1 mL/min (by C-G formula based on SCr of 0.63 mg/dL). Liver Function Tests: Recent Labs  Lab 02/17/17 0933  AST 22  ALT 16  ALKPHOS 76  BILITOT 0.4  PROT 8.4*  ALBUMIN 3.8   No results for input(s): LIPASE, AMYLASE in the last 168 hours. No results for input(s): AMMONIA in the last 168 hours. Coagulation Profile: No results for input(s): INR, PROTIME in the last 168 hours. Cardiac Enzymes: No results for input(s): CKTOTAL, CKMB, CKMBINDEX, TROPONINI in the last 168 hours. BNP (last 3 results) No results for input(s): PROBNP in the last 8760 hours. HbA1C: No results for input(s): HGBA1C in the last 72 hours. CBG: Recent Labs  Lab 02/18/17 0000 02/18/17 0803  GLUCAP 135* 90   Lipid Profile: No results for input(s): CHOL, HDL, LDLCALC, TRIG, CHOLHDL, LDLDIRECT in the last 72 hours. Thyroid Function Tests: No results for input(s): TSH, T4TOTAL, FREET4, T3FREE, THYROIDAB in the last 72 hours. Anemia Panel: No results for input(s): VITAMINB12, FOLATE, FERRITIN, TIBC, IRON, RETICCTPCT in the last 72 hours. Sepsis Labs: Recent Labs  Lab 02/17/17 1040  LATICACIDVEN 1.09    No results found for this or any previous  visit (from the past 240 hour(s)).       Radiology Studies: Dg Chest 2 View  Result Date: 02/17/2017 CLINICAL DATA:  Three days of vomiting and lower abdominal pain. Also dysuria. History of hypertension, diabetes, previous fall with rib fractures, breast malignancy. EXAM: CHEST  2 VIEW COMPARISON:  Portable chest x-ray of January 07, 2017 FINDINGS: The lungs are adequately inflated and clear. The heart and pulmonary vascularity are normal. The mediastinum is normal in width. There are bilateral breast implants present. There is mild loss of height of the bodies of T8 and T9 which is stable. IMPRESSION: There is no active cardiopulmonary disease. Electronically  Signed   By: David  Martinique M.D.   On: 02/17/2017 10:15   Ct Abdomen Pelvis W Contrast  Result Date: 02/17/2017 CLINICAL DATA:  Abdominal pain, vomiting EXAM: CT ABDOMEN AND PELVIS WITH CONTRAST TECHNIQUE: Multidetector CT imaging of the abdomen and pelvis was performed using the standard protocol following bolus administration of intravenous contrast. CONTRAST:  153mL ISOVUE-300 IOPAMIDOL (ISOVUE-300) INJECTION 61% COMPARISON:  None. FINDINGS: Lower chest: Bilateral breast implants partially imaged. Heart is upper limits normal in size. Dependent atelectasis in the lung bases. No effusions. Hepatobiliary: No focal liver abnormality is seen. Status post cholecystectomy. No biliary dilatation. Pancreas: No focal abnormality or ductal dilatation. Spleen: No focal abnormality.  Normal size. Adrenals/Urinary Tract: Left pelvic kidney again noted. Small cysts in the upper pole of the left kidney and midpole of the right kidney. No hydronephrosis. Urinary bladder and adrenal glands unremarkable. Stomach/Bowel: Distal descending colonic and sigmoid diverticulosis. Slight stranding around the distal descending colon compatible with early active diverticulitis. Moderate stool burden in the colon. Stomach and small bowel decompressed, unremarkable. Vascular/Lymphatic: Aortic atherosclerosis. No enlarged abdominal or pelvic lymph nodes. Reproductive: Prior hysterectomy.  No adnexal masses. Other: No free fluid or free air. Musculoskeletal: Mild compression fracture through the superior endplate of L3 is stable since prior study. IMPRESSION: Scattered left colonic diverticulosis. Slight stranding around the distal descending colon suggesting early active diverticulitis. Moderate stool burden in the colon. Left pelvic kidney. Small cysts in the kidneys bilaterally. No hydronephrosis. Prior hysterectomy and cholecystectomy. Electronically Signed   By: Rolm Baptise M.D.   On: 02/17/2017 17:57        Scheduled Meds: .  atenolol  50 mg Oral BID  . divalproex  500 mg Oral QHS  . fentaNYL (SUBLIMAZE) injection  50 mcg Intravenous Once  . heparin  5,000 Units Subcutaneous Q8H  . insulin aspart  0-9 Units Subcutaneous TID WC  . insulin glargine  30 Units Subcutaneous QHS  . levothyroxine  200 mcg Oral QHS  . lisinopril  10 mg Oral QHS  . Morphine-Naltrexone  1 capsule Oral Daily  . pregabalin  75 mg Oral BID   Continuous Infusions: . sodium chloride 50 mL/hr at 02/18/17 1056  . meropenem (MERREM) IV Stopped (02/18/17 6160)     LOS: 0 days    Jo-ku Elta [place]) Mervyn Skeeters, PA-S

## 2017-02-19 DIAGNOSIS — R1030 Lower abdominal pain, unspecified: Secondary | ICD-10-CM | POA: Diagnosis present

## 2017-02-19 DIAGNOSIS — K5732 Diverticulitis of large intestine without perforation or abscess without bleeding: Secondary | ICD-10-CM | POA: Diagnosis present

## 2017-02-19 DIAGNOSIS — G8929 Other chronic pain: Secondary | ICD-10-CM | POA: Diagnosis present

## 2017-02-19 DIAGNOSIS — I10 Essential (primary) hypertension: Secondary | ICD-10-CM | POA: Diagnosis not present

## 2017-02-19 DIAGNOSIS — Z886 Allergy status to analgesic agent status: Secondary | ICD-10-CM | POA: Diagnosis not present

## 2017-02-19 DIAGNOSIS — E039 Hypothyroidism, unspecified: Secondary | ICD-10-CM | POA: Diagnosis present

## 2017-02-19 DIAGNOSIS — G894 Chronic pain syndrome: Secondary | ICD-10-CM | POA: Diagnosis not present

## 2017-02-19 DIAGNOSIS — Z79899 Other long term (current) drug therapy: Secondary | ICD-10-CM | POA: Diagnosis not present

## 2017-02-19 DIAGNOSIS — R112 Nausea with vomiting, unspecified: Secondary | ICD-10-CM | POA: Diagnosis not present

## 2017-02-19 DIAGNOSIS — E119 Type 2 diabetes mellitus without complications: Secondary | ICD-10-CM | POA: Diagnosis present

## 2017-02-19 DIAGNOSIS — Z853 Personal history of malignant neoplasm of breast: Secondary | ICD-10-CM | POA: Diagnosis not present

## 2017-02-19 DIAGNOSIS — Z885 Allergy status to narcotic agent status: Secondary | ICD-10-CM | POA: Diagnosis not present

## 2017-02-19 DIAGNOSIS — Z881 Allergy status to other antibiotic agents status: Secondary | ICD-10-CM | POA: Diagnosis not present

## 2017-02-19 DIAGNOSIS — Z888 Allergy status to other drugs, medicaments and biological substances status: Secondary | ICD-10-CM | POA: Diagnosis not present

## 2017-02-19 DIAGNOSIS — Z880 Allergy status to penicillin: Secondary | ICD-10-CM | POA: Diagnosis not present

## 2017-02-19 DIAGNOSIS — Z9049 Acquired absence of other specified parts of digestive tract: Secondary | ICD-10-CM | POA: Diagnosis not present

## 2017-02-19 DIAGNOSIS — F419 Anxiety disorder, unspecified: Secondary | ICD-10-CM | POA: Diagnosis present

## 2017-02-19 DIAGNOSIS — G40909 Epilepsy, unspecified, not intractable, without status epilepticus: Secondary | ICD-10-CM | POA: Diagnosis present

## 2017-02-19 DIAGNOSIS — Z79890 Hormone replacement therapy (postmenopausal): Secondary | ICD-10-CM | POA: Diagnosis not present

## 2017-02-19 DIAGNOSIS — Z90710 Acquired absence of both cervix and uterus: Secondary | ICD-10-CM | POA: Diagnosis not present

## 2017-02-19 DIAGNOSIS — Z794 Long term (current) use of insulin: Secondary | ICD-10-CM | POA: Diagnosis not present

## 2017-02-19 DIAGNOSIS — K219 Gastro-esophageal reflux disease without esophagitis: Secondary | ICD-10-CM | POA: Diagnosis present

## 2017-02-19 DIAGNOSIS — K5792 Diverticulitis of intestine, part unspecified, without perforation or abscess without bleeding: Secondary | ICD-10-CM | POA: Diagnosis not present

## 2017-02-19 LAB — GLUCOSE, CAPILLARY
GLUCOSE-CAPILLARY: 93 mg/dL (ref 65–99)
GLUCOSE-CAPILLARY: 92 mg/dL (ref 65–99)
GLUCOSE-CAPILLARY: 117 mg/dL — AB (ref 65–99)
GLUCOSE-CAPILLARY: 114 mg/dL — AB (ref 65–99)
Glucose-Capillary: 161 mg/dL — ABNORMAL HIGH (ref 65–99)

## 2017-02-19 MED ORDER — CIPROFLOXACIN IN D5W 400 MG/200ML IV SOLN
400.0000 mg | Freq: Two times a day (BID) | INTRAVENOUS | Status: DC
  Administered 2017-02-19 – 2017-02-24 (×10): 400 mg via INTRAVENOUS
  Filled 2017-02-19 (×10): qty 200

## 2017-02-19 MED ORDER — METRONIDAZOLE IN NACL 5-0.79 MG/ML-% IV SOLN
500.0000 mg | Freq: Three times a day (TID) | INTRAVENOUS | Status: DC
  Administered 2017-02-19 – 2017-02-24 (×14): 500 mg via INTRAVENOUS
  Filled 2017-02-19 (×16): qty 100

## 2017-02-19 MED ORDER — LACTATED RINGERS IV SOLN
INTRAVENOUS | Status: DC
  Administered 2017-02-19: 1000 mL via INTRAVENOUS
  Administered 2017-02-20: 14:00:00 via INTRAVENOUS
  Administered 2017-02-21 (×2): 1000 mL via INTRAVENOUS
  Administered 2017-02-22 – 2017-02-23 (×2): via INTRAVENOUS

## 2017-02-19 MED ORDER — HYDRALAZINE HCL 20 MG/ML IJ SOLN
5.0000 mg | Freq: Once | INTRAMUSCULAR | Status: AC
  Administered 2017-02-19: 5 mg via INTRAVENOUS
  Filled 2017-02-19: qty 1

## 2017-02-19 NOTE — Progress Notes (Signed)
Patient B/P-178/93 this morning. Patient does not have any PRN medicine for high B/P. Paged Night on-call . No response until this time.

## 2017-02-19 NOTE — Progress Notes (Addendum)
PROGRESS NOTE  [name]  MVE:[id number] DOB: [birth year] DOA: 02/17/2017 PCP: Medicine, [facility]   Brief Narrative: Andrea [name] is a 67 y.o. female with a history of IDDM, chronic pain, and remote hx breast CA who presented to the ED 1/28 with LLQ abdominal pain, nausea and vomiting admitted for CT findings consistent with her first episode of acute uncomplicated diverticulitis. Meropenem was started due to a history of ESBL UTI, though urine culture returned negative and antibiotics changed to zosyn. Diet has been advanced to clear liquids with intolerance thus far.   Assessment & Plan: Active Problems:   Diverticulitis  Acute diverticulitis: Uncomplicated, first episode.  - Narrow meropenem to cipro/flagyl. Has PCN allergy in addition to allergies to maxzide (swelling), steroid psychosis, hallucinations with toradol.   Intractable nausea and vomiting: Due to diverticulitis as above.  - Continue IVF's - IV antiemetics, analgesics as ordered.  - Continue clear liquids as tolerated. May reattempt advancement 1/30.  History of ESBL UTI: Urine culture negative.   IDDM:  - Continue lantus, SSI  Chronic pain:  - Continue home pain regimen as tolerated  HTN: Continue atenolol, lisinopril  Hypothyroidism: Last TSH was 26 - Recheck TSH - Continue synthroid.   Seizure disorder:  - Continue depakote   DVT prophylaxis: Heparin Code Status: Full Family Communication: None at bedside Disposition Plan: Home when improved  Consultants:   None  Procedures:   None   Antimicrobials:  Meropenem 1/28 - 1/30  Cipro/flagyl 1/30 >>    Subjective: Had emesis of jello this morning, no intake since. Points to LLQ and says severe pain "right here" and all over the abdomen. Denies dysuria.   Objective: Vitals:   02/18/17 2353 02/19/17 0430 02/19/17 0640 02/19/17 1400  BP: (!) 160/96 (!) 178/93 (!) 172/68 (!) 155/68  Pulse: 70 78 77 77  Resp:  18 18 18 17   Temp: 98.4 F (36.9 C) 98.4 F (36.9 C)  98.6 F (37 C)  TempSrc: Oral Oral  Oral  SpO2: 98% 98% 98% 96%  Weight:      Height:        Intake/Output Summary (Last 24 hours) at 02/19/2017 1705 Last data filed at 02/19/2017 1400 Gross per 24 hour  Intake 2210 ml  Output 750 ml  Net 1460 ml   Filed Weights   02/17/17 0921 02/18/17 0500  Weight: 62.6 kg (138 lb) 60 kg (132 lb 4.4 oz)    Gen: 67 y.o. female in no distress  Pulm: Non-labored breathing room air. Clear to auscultation bilaterally.  CV: Regular rate and rhythm. No murmur, rub, or gallop. No JVD, no pedal edema. GI: Abdomen soft, LLQ tenderness in addition to diffuse tenderness without rebound, non-distended, with normoactive bowel sounds. No organomegaly or masses felt. Ext: Warm, no deformities Skin: No rashes, lesions no ulcers Neuro: Alert and oriented. No focal neurological deficits. Psych: Judgement and insight appear normal. Mood & affect appropriate.   Data Reviewed: I have personally reviewed following labs and imaging studies  CBC: Recent Labs  Lab 02/17/17 0933 02/17/17 1039 02/18/17 0435  WBC 14.8*  --  8.7  NEUTROABS 9.5*  --   --   HGB 14.5 15.6* 12.6  HCT 41.0 46.0 36.7  MCV 86.7  --  87.0  PLT 344  --  283   Basic Metabolic Panel: Recent Labs  Lab 02/17/17 0933 02/17/17 1039 02/18/17 0435  NA 137 139 138  K 3.6 3.6 3.8  CL 96* 96* 102  CO2 30  --  27  GLUCOSE 219* 218* 129*  BUN 15 15 10   CREATININE 0.78 0.80 0.63  CALCIUM 9.6  --  8.7*   GFR: Estimated Creatinine Clearance: 63.1 mL/min (by C-G formula based on SCr of 0.63 mg/dL). Liver Function Tests: Recent Labs  Lab 02/17/17 0933  AST 22  ALT 16  ALKPHOS 76  BILITOT 0.4  PROT 8.4*  ALBUMIN 3.8   No results for input(s): LIPASE, AMYLASE in the last 168 hours. No results for input(s): AMMONIA in the last 168 hours. Coagulation Profile: No results for input(s): INR, PROTIME in the last 168 hours. Cardiac  Enzymes: No results for input(s): CKTOTAL, CKMB, CKMBINDEX, TROPONINI in the last 168 hours. BNP (last 3 results) No results for input(s): PROBNP in the last 8760 hours. HbA1C: No results for input(s): HGBA1C in the last 72 hours. CBG: Recent Labs  Lab 02/18/17 2114 02/19/17 0710 02/19/17 0803 02/19/17 1205 02/19/17 1618  GLUCAP 156* 93 92 117* 161*   Lipid Profile: No results for input(s): CHOL, HDL, LDLCALC, TRIG, CHOLHDL, LDLDIRECT in the last 72 hours. Thyroid Function Tests: No results for input(s): TSH, T4TOTAL, FREET4, T3FREE, THYROIDAB in the last 72 hours. Anemia Panel: No results for input(s): VITAMINB12, FOLATE, FERRITIN, TIBC, IRON, RETICCTPCT in the last 72 hours. Urine analysis:    Component Value Date/Time   COLORURINE YELLOW 02/17/2017 [place] 02/17/2017 1212   LABSPEC 1.023 02/17/2017 1212   PHURINE 5.0 02/17/2017 1212   GLUCOSEU 50 (A) 02/17/2017 1212   HGBUR NEGATIVE 02/17/2017 1212   BILIRUBINUR NEGATIVE 02/17/2017 1212   KETONESUR 20 (A) 02/17/2017 1212   PROTEINUR >=300 (A) 02/17/2017 1212   UROBILINOGEN 1.0 12/02/2013 1612   NITRITE NEGATIVE 02/17/2017 1212   LEUKOCYTESUR TRACE (A) 02/17/2017 1212   Recent Results (from the past 240 hour(s))  Urine culture     Status: None   Collection Time: 02/17/17 12:12 PM  Result Value Ref Range Status   Specimen Description URINE, CATHETERIZED  Final   Special Requests NONE  Final   Culture   Final    NO GROWTH Performed at [facility], [place] [address], [place], [place] [address]    Report Status 02/18/2017 FINAL  Final      Radiology Studies: Ct Abdomen Pelvis W Contrast  Result Date: 02/17/2017 CLINICAL DATA:  Abdominal pain, vomiting EXAM: CT ABDOMEN AND PELVIS WITH CONTRAST TECHNIQUE: Multidetector CT imaging of the abdomen and pelvis was performed using the standard protocol following bolus administration of intravenous contrast. CONTRAST:  155mL ISOVUE-300 IOPAMIDOL  (ISOVUE-300) INJECTION 61% COMPARISON:  None. FINDINGS: Lower chest: Bilateral breast implants partially imaged. Heart is upper limits normal in size. Dependent atelectasis in the lung bases. No effusions. Hepatobiliary: No focal liver abnormality is seen. Status post cholecystectomy. No biliary dilatation. Pancreas: No focal abnormality or ductal dilatation. Spleen: No focal abnormality.  Normal size. Adrenals/Urinary Tract: Left pelvic kidney again noted. Small cysts in the upper pole of the left kidney and midpole of the right kidney. No hydronephrosis. Urinary bladder and adrenal glands unremarkable. Stomach/Bowel: Distal descending colonic and sigmoid diverticulosis. Slight stranding around the distal descending colon compatible with early active diverticulitis. Moderate stool burden in the colon. Stomach and small bowel decompressed, unremarkable. Vascular/Lymphatic: Aortic atherosclerosis. No enlarged abdominal or pelvic lymph nodes. Reproductive: Prior hysterectomy.  No adnexal masses. Other: No free fluid or free air. Musculoskeletal: Mild compression fracture through the superior endplate of L3 is stable since prior study.  IMPRESSION: Scattered left colonic diverticulosis. Slight stranding around the distal descending colon suggesting early active diverticulitis. Moderate stool burden in the colon. Left pelvic kidney. Small cysts in the kidneys bilaterally. No hydronephrosis. Prior hysterectomy and cholecystectomy. Electronically Signed   By: Rolm Baptise M.D.   On: 02/17/2017 17:57    Scheduled Meds: . atenolol  50 mg Oral BID  . divalproex  500 mg Oral QHS  . fentaNYL (SUBLIMAZE) injection  50 mcg Intravenous Once  . heparin  5,000 Units Subcutaneous Q8H  . insulin aspart  0-9 Units Subcutaneous TID WC  . insulin glargine  30 Units Subcutaneous QHS  . levothyroxine  200 mcg Oral QHS  . lisinopril  10 mg Oral QHS  . morphine  15 mg Oral Q12H  . pregabalin  75 mg Oral BID   Continuous  Infusions:   LOS: 0 days   Time spent: 25 minutes.  Vance Gather, MD [facility] Pager [phone]  If 7PM-7AM, please contact night-coverage [email or web address] Password [email or web address] 02/19/2017, 5:05 PM

## 2017-02-19 NOTE — Care Management Obs Status (Signed)
[facility] NOTIFICATION   Patient Details  Name: Berline Semrad MRN: [id number] Date of Birth: Jan 13, 1951   Medicare Observation Status Notification Given:  Yes    Guadalupe Maple, RN 02/19/2017, 1:32 PM

## 2017-02-19 NOTE — Progress Notes (Signed)
Pharmacy Antibiotic Note  Andrea [name] is a 67 y.o. female admitted on 02/17/2017 with diverticulitis.  Pharmacy has been consulted for meropenem dosing.  Patient was hospitalized in December 2018 with pyelonephritis secondary to ESBL-producing E.Coli isolate, treated with carbapenem therapy IV followed by fosfomycin PO.   Patient is PCN-allergic but tolerated cephalosporin (ceftriaxone x 1 in ED 1/28).    Of note, patient is on valproate and has hx of seizure disorder.  Carbapenems often substantially reduce valproate blood levels & can lower seizure threshold.   UTI has been ruled out.  Pharmacy has been consulted to switch Meropenem to Cipro/Flagyl for diverticulitis.   Renal function has been stable since admission.  Urine Cx data is negative.  Plan:  Cipro 400mg  IV q12h   Flagyl per MD  No dose adjustments anticipated.  Pharmacy to sign off.  Please re-consult as needed.   Height: 5\' 5"  (165.1 cm) Weight: 132 lb 4.4 oz (60 kg) IBW/kg (Calculated) : 57  Temp (24hrs), Avg:98.4 F (36.9 C), Min:98.3 F (36.8 C), Max:98.6 F (37 C)  Recent Labs  Lab 02/17/17 0933 02/17/17 1039 02/17/17 1040 02/18/17 0435  WBC 14.8*  --   --  8.7  CREATININE 0.78 0.80  --  0.63  LATICACIDVEN  --   --  1.09  --     Estimated Creatinine Clearance: 63.1 mL/min (by C-G formula based on SCr of 0.63 mg/dL).    Allergies  Allergen Reactions  . Aspirin Nausea And Vomiting  . Maxzide [Hydrochlorothiazide W-Triamterene] Swelling    Fluid retention  . Metformin And Related Diarrhea  . Nsaids Nausea And Vomiting  . Other Other (See Comments)    All steroids produce psychosis per pt Artificial sweeteners produce nausea and upset stomach.  Gregary Cromer [Pravastatin] Other (See Comments)    unknown  . Stadol [Butorphanol] Other (See Comments)    Toradol, etc And related- hallucinations  . Toradol [Ketorolac Tromethamine] Other (See Comments)    hallucinations  . Vistaril [Hydroxyzine Hcl]  Other (See Comments)    unknown  . Erythromycin Itching and Rash  . Morphine And Related Hives and Rash    Iv morphine.  . Penicillins Hives, Itching and Rash    Has patient had a PCN reaction causing immediate rash, facial/tongue/throat swelling, SOB or lightheadedness with hypotension: yes Has patient had a PCN reaction causing severe rash involving mucus membranes or skin necrosis: unknown Has patient had a PCN reaction that required hospitalization : unknown Has patient had a PCN reaction occurring within the last 10 years: no If all of the above answers are "NO", then may proceed with Cephalosporin use.     Antimicrobials this admission: 1/28 >> ceftriaxone x 1 dose 1/29 >> meropenem>>1/30 1/30 Cipro/Flagyl>>  Dose adjustments this admission: --  Microbiology results: 1/28 Urine cx:  NG, FINAL  Thank you for allowing pharmacy to be a part of this patient's care.  Netta Cedars, PharmD, BCPS 02/19/2017  5:40 PM

## 2017-02-19 NOTE — Progress Notes (Signed)
Initial Nutrition Assessment  INTERVENTION:   Will discuss supplements once nausea more controlled  NUTRITION DIAGNOSIS:   Inadequate oral intake related to nausea, vomiting(UTI) as evidenced by per patient/family report.  GOAL:   Patient will meet greater than or equal to 90% of their needs  MONITOR:   PO intake, Weight trends, Labs, I & O's  REASON FOR ASSESSMENT:   Malnutrition Screening Tool   ASSESSMENT:   67 year old female with history of insulin-dependent diabetes, remote breast cancer, who was admitted to the hospital with abdominal pain, CT scan on admission was concerning for acute diverticulitis.   Pt reports not being able to tolerate any solids or liquids since 1/23. Per pt and RN at bedside, pt projectile vomited her liquids this morning. Pt states she had broth, italian ice, jello and hot tea for lunch and has tolerated so far. Per RN, pt may be having a migraine. Pt states she does not drink Ensure or Boost drinks since they contain artificial sweeteners (she is allergic). Stated to pt that RD can discuss other protein supplement options once nausea more controlled.  Per patient, UBW is 163 lb. Per chart review, pt has lost 17 lb since 12/20 (11% wt loss x 1 month, significant for time frame).   Labs reviewed. Medications: IV Zofran PRN  NUTRITION - FOCUSED PHYSICAL EXAM:    Most Recent Value  Orbital Region  No depletion  Upper Arm Region  No depletion  Thoracic and Lumbar Region  Unable to assess  Buccal Region  No depletion  Temple Region  Mild depletion  Clavicle Bone Region  No depletion  Clavicle and Acromion Bone Region  No depletion  Scapular Bone Region  No depletion  Dorsal Hand  No depletion  Patellar Region  No depletion  Anterior Thigh Region  No depletion  Posterior Calf Region  No depletion  Edema (RD Assessment)  None       Diet Order:  Diet clear liquid Room service appropriate? Yes; Fluid consistency: Thin Seizure  precautions  EDUCATION NEEDS:   Not appropriate for education at this time  Skin:  Skin Assessment: Reviewed RN Assessment  Last BM:  1/29  Height:   Ht Readings from Last 1 Encounters:  02/17/17 5\' 5"  (1.651 m)    Weight:   Wt Readings from Last 1 Encounters:  02/18/17 132 lb 4.4 oz (60 kg)    Ideal Body Weight:  56.8 kg  BMI:  Body mass index is 22.01 kg/m.  Estimated Nutritional Needs:   Kcal:  1500-1700  Protein:  70-80g  Fluid:  1.7L/day  Clayton Bibles, MS, RD, LDN [facility] Pager: [phone] After Hours Pager: [phone]

## 2017-02-20 LAB — BASIC METABOLIC PANEL
Anion gap: 6 (ref 5–15)
BUN: 5 mg/dL — AB (ref 6–20)
CHLORIDE: 105 mmol/L (ref 101–111)
CO2: 26 mmol/L (ref 22–32)
Calcium: 8.8 mg/dL — ABNORMAL LOW (ref 8.9–10.3)
Creatinine, Ser: 0.78 mg/dL (ref 0.44–1.00)
GFR calc Af Amer: >60 mL/min (ref >60)
GFR calc non Af Amer: >60 mL/min (ref >60)
Glucose, Bld: 86 mg/dL (ref 65–99)
POTASSIUM: 4.5 mmol/L (ref 3.5–5.1)
SODIUM: 137 mmol/L (ref 135–145)

## 2017-02-20 LAB — TSH: TSH: 6.323 u[IU]/mL — ABNORMAL HIGH (ref 0.350–4.500)

## 2017-02-20 LAB — GLUCOSE, CAPILLARY
GLUCOSE-CAPILLARY: 178 mg/dL — AB (ref 65–99)
GLUCOSE-CAPILLARY: 156 mg/dL — AB (ref 65–99)
Glucose-Capillary: 77 mg/dL (ref 65–99)
Glucose-Capillary: 97 mg/dL (ref 65–99)

## 2017-02-20 MED ORDER — LISINOPRIL 20 MG PO TABS
20.0000 mg | ORAL_TABLET | Freq: Every day | ORAL | Status: DC
  Administered 2017-02-20: 20 mg via ORAL
  Filled 2017-02-20: qty 1

## 2017-02-20 NOTE — Progress Notes (Signed)
PROGRESS NOTE  Andrea [name]  DJM:[id number] DOB: 1950/07/30 DOA: 02/17/2017 PCP: Medicine, [facility]   Brief Narrative: Andrea [name] is a 67 y.o. female with a history of IDDM, chronic pain, and remote hx breast CA who presented to the ED 1/28 with LLQ abdominal pain, nausea and vomiting admitted for CT findings consistent with her first episode of acute uncomplicated diverticulitis. Meropenem was started due to a history of ESBL UTI, though urine culture returned negative and antibiotics changed to zosyn. Diet has been advanced to clear liquids with intolerance thus far.   Assessment & Plan: Active Problems:   Diverticulitis  Acute diverticulitis: Uncomplicated, first episode.  - Still significantly tender, though no worsening. If fails attempt to advance and pain remains requiring IV therapies, consider repeat imaging.  - Narrowd meropenem to cipro/flagyl. Has PCN allergy in addition to allergies to maxzide (swelling), steroid psychosis, hallucinations with toradol.   Intractable nausea and vomiting: Due to diverticulitis as above.  - Will continue IVF's until taking po reliably.  - IV antiemetics, analgesics as ordered.  - Advance to fulls today, soft diet if tolerated.   History of ESBL UTI: Urine culture negative.   IDDM: At inpatient goal. - Continue lantus, SSI  Chronic pain:  - Continue home pain regimen as tolerated  HTN: Elevated consistently, though not in severe range, possibly due to pain.  - Continue atenolol, will increase dose of lisinopril while admitted, needs outpatient follow up.   Hypothyroidism: Last TSH was 26, now improved to 6.323.  - Continue synthroid  Seizure disorder:  - Continue depakote   DVT prophylaxis: Heparin Code Status: Full Family Communication: None at bedside Disposition Plan: Home when improved, if tolerates diet, can DC 2/1 Consultants: None Procedures: None Antimicrobials:  Meropenem 1/28 -  1/30  Cipro/flagyl 1/30 >>    Subjective: No emesis since yesterday AM. Nausea controlled with medications. Ready for advancing diet, says abdominal pain is relatively unchanged, however.    Objective: BP (!) 174/87 (BP Location: Right Arm)   Pulse 69   Temp 97.9 F (36.6 C) (Oral)   Resp 18   Ht 5\' 5"  (1.651 m)   Wt 60 kg (132 lb 4.4 oz)   SpO2 97%   BMI 22.01 kg/m   Gen: 67 y.o. female in no distress  Pulm: Non-labored breathing room air. Clear to auscultation bilaterally.  CV: Regular rate and rhythm. No murmur, rub, or gallop. No JVD, no pedal edema. GI: Abdomen soft, still quite tender in LLQ and diffusely with voluntary guarding, no rebound, non-distended, with normoactive bowel sounds. No organomegaly or masses felt. Ext: Warm, no deformities Skin: No rashes, lesions no ulcers Neuro: Alert and oriented. No focal neurological deficits. Psych: Judgement and insight appear normal. Mood & affect appropriate.   CBC: Recent Labs  Lab 02/17/17 0933 02/17/17 1039 02/18/17 0435  WBC 14.8*  --  8.7  NEUTROABS 9.5*  --   --   HGB 14.5 15.6* 12.6  HCT 41.0 46.0 36.7  MCV 86.7  --  87.0  PLT 344  --  222   Basic Metabolic Panel: Recent Labs  Lab 02/17/17 0933 02/17/17 1039 02/18/17 0435 02/20/17 0711  NA 137 139 138 137  K 3.6 3.6 3.8 4.5  CL 96* 96* 102 105  CO2 30  --  27 26  GLUCOSE 219* 218* 129* 86  BUN 15 15 10  5*  CREATININE 0.78 0.80 0.63 0.78  CALCIUM 9.6  --  8.7* 8.8*  Liver Function Tests: Recent Labs  Lab 02/17/17 0933  AST 22  ALT 16  ALKPHOS 76  BILITOT 0.4  PROT 8.4*  ALBUMIN 3.8   Thyroid Function Tests: Recent Labs    02/20/17 0711  TSH 6.323*   Urine analysis:    Component Value Date/Time   COLORURINE YELLOW 02/17/2017 [place] 02/17/2017 1212   LABSPEC 1.023 02/17/2017 1212   PHURINE 5.0 02/17/2017 1212   GLUCOSEU 50 (A) 02/17/2017 1212   HGBUR NEGATIVE 02/17/2017 1212   BILIRUBINUR NEGATIVE 02/17/2017  1212   KETONESUR 20 (A) 02/17/2017 1212   PROTEINUR >=300 (A) 02/17/2017 1212   UROBILINOGEN 1.0 12/02/2013 1612   NITRITE NEGATIVE 02/17/2017 1212   LEUKOCYTESUR TRACE (A) 02/17/2017 1212   Recent Results (from the past 240 hour(s))  Urine culture     Status: None   Collection Time: 02/17/17 12:12 PM  Result Value Ref Range Status   Specimen Description URINE, CATHETERIZED  Final   Special Requests NONE  Final   Culture   Final    NO GROWTH Performed at [facility], [place] [address], [place], [place] [address]    Report Status 02/18/2017 FINAL  Final      Radiology Studies: No results found.  Scheduled Meds: . atenolol  50 mg Oral BID  . divalproex  500 mg Oral QHS  . fentaNYL (SUBLIMAZE) injection  50 mcg Intravenous Once  . heparin  5,000 Units Subcutaneous Q8H  . insulin aspart  0-9 Units Subcutaneous TID WC  . insulin glargine  30 Units Subcutaneous QHS  . levothyroxine  200 mcg Oral QHS  . lisinopril  20 mg Oral QHS  . morphine  15 mg Oral Q12H  . pregabalin  75 mg Oral BID   Continuous Infusions: . ciprofloxacin Stopped (02/20/17 0612)  . lactated ringers 1,000 mL (02/19/17 2003)  . metronidazole Stopped (02/20/17 0310)     LOS: 1 day   Time spent: 25 minutes.  Vance Gather, MD [facility] [email or web address] Password [email or web address] 02/20/2017, 9:35 AM

## 2017-02-21 ENCOUNTER — Inpatient Hospital Stay (HOSPITAL_COMMUNITY): Payer: Medicare Other

## 2017-02-21 ENCOUNTER — Encounter (HOSPITAL_COMMUNITY): Payer: Self-pay | Admitting: Radiology

## 2017-02-21 LAB — GLUCOSE, CAPILLARY
GLUCOSE-CAPILLARY: 184 mg/dL — AB (ref 65–99)
Glucose-Capillary: 146 mg/dL — ABNORMAL HIGH (ref 65–99)
Glucose-Capillary: 116 mg/dL — ABNORMAL HIGH (ref 65–99)
Glucose-Capillary: 193 mg/dL — ABNORMAL HIGH (ref 65–99)

## 2017-02-21 MED ORDER — IOPAMIDOL (ISOVUE-300) INJECTION 61%
INTRAVENOUS | Status: AC
  Administered 2017-02-21: 30 mL
  Filled 2017-02-21: qty 30

## 2017-02-21 MED ORDER — IOPAMIDOL (ISOVUE-300) INJECTION 61%
INTRAVENOUS | Status: AC
  Administered 2017-02-21: 100 mL via INTRAVENOUS
  Filled 2017-02-21: qty 100

## 2017-02-21 MED ORDER — HYDRALAZINE HCL 25 MG PO TABS
25.0000 mg | ORAL_TABLET | Freq: Three times a day (TID) | ORAL | Status: DC
  Administered 2017-02-21 – 2017-02-24 (×9): 25 mg via ORAL
  Filled 2017-02-21 (×10): qty 1

## 2017-02-21 NOTE — Progress Notes (Signed)
PROGRESS NOTE  Andrea [name]  HAL:[id number] DOB: 09-28-1950 DOA: 02/17/2017 PCP: Medicine, [facility]   Brief Narrative: Andrea [name] is a 67 y.o. female with a history of IDDM, chronic pain, and remote hx breast CA who presented to the ED 1/28 with LLQ abdominal pain, nausea and vomiting admitted for CT findings consistent with her first episode of acute uncomplicated diverticulitis. Meropenem was started due to a history of ESBL UTI, though urine culture returned negative and antibiotics changed to zosyn. Diet has been advanced to clear liquids with intolerance thus far.   Assessment & Plan: Principal Problem:   Diverticulitis Active Problems:   Hypertension   Diabetes mellitus without complication (HCC)   Hypothyroid   Epilepsy ([place])   Nausea & vomiting   Chronic pain  Acute diverticulitis: Uncomplicated, first episode.  - Still significantly tender and now having acute diarrhea.  Will check Cdif and repeat CT abd/ pelvis, r/o abscess.  Continue IV cipro/flagyl (has PCN allergy in addition to allergies to maxzide (swelling), steroid psychosis, hallucinations with toradol). Move back to clears.   Intractable nausea and vomiting: Due to diverticulitis as above. - Will continue IVF's  - IV antiemetics, analgesics as ordered.   History of ESBL UTI: Urine culture negative.   IDDM: At inpatient goal. BS"s well controlled.  - Continue lantus, SSI  Chronic pain:  - Continue home pain regimen as tolerated  HTN: Elevated consistently, though not in severe range, possibly due to pain.  - Continue atenolol, hold ACEi for now w/ repeat contrast study anticipated. Will add hydralazine po for now.   Hypothyroidism: Last TSH was 26, now improved to 6.323.  - Continue synthroid  Seizure disorder:  - Continue depakote   DVT prophylaxis: Heparin Code Status: Full Family Communication: None at bedside Disposition Plan: Home when improved Consultants:  None Procedures: None Antimicrobials:  Meropenem 1/28 - 1/30  Cipro/flagyl 1/30 >>    Subjective:  Vomited once yesterday, but now today having large watery diarrheal stools, 3 overnight and today.  Still lower abd pain , R and L lower abd   Objective: BP (!) 155/77 (BP Location: Right Wrist)   Pulse 83   Temp 98.6 F (37 C) (Oral)   Resp 16   Ht 5\' 5"  (1.651 m)   Wt 60 kg (132 lb 4.4 oz)   SpO2 96%   BMI 22.01 kg/m   Gen: 67 y.o. female in no distress  Pulm: Non-labored breathing room air. Clear to auscultation bilaterally.  CV: Regular rate and rhythm. No murmur, rub, or gallop. No JVD, no pedal edema. GI: Abdomen soft, still bilat LQ tender , no peritoneal signs Ext: Warm, no deformities Skin: No rashes, lesions no ulcers Neuro: Alert and oriented. No focal neurological deficits. Psych: Judgement and insight appear normal. Mood & affect appropriate.   CBC: Recent Labs  Lab 02/17/17 0933 02/17/17 1039 02/18/17 0435  WBC 14.8*  --  8.7  NEUTROABS 9.5*  --   --   HGB 14.5 15.6* 12.6  HCT 41.0 46.0 36.7  MCV 86.7  --  87.0  PLT 344  --  735   Basic Metabolic Panel: Recent Labs  Lab 02/17/17 0933 02/17/17 1039 02/18/17 0435 02/20/17 0711  NA 137 139 138 137  K 3.6 3.6 3.8 4.5  CL 96* 96* 102 105  CO2 30  --  27 26  GLUCOSE 219* 218* 129* 86  BUN 15 15 10  5*  CREATININE 0.78 0.80 0.63 0.78  CALCIUM  9.6  --  8.7* 8.8*   Liver Function Tests: Recent Labs  Lab 02/17/17 0933  AST 22  ALT 16  ALKPHOS 76  BILITOT 0.4  PROT 8.4*  ALBUMIN 3.8   Thyroid Function Tests: Recent Labs    02/20/17 0711  TSH 6.323*   Urine analysis:    Component Value Date/Time   COLORURINE YELLOW 02/17/2017 [place] 02/17/2017 1212   LABSPEC 1.023 02/17/2017 1212   PHURINE 5.0 02/17/2017 1212   GLUCOSEU 50 (A) 02/17/2017 1212   HGBUR NEGATIVE 02/17/2017 1212   BILIRUBINUR NEGATIVE 02/17/2017 1212   KETONESUR 20 (A) 02/17/2017 1212   PROTEINUR  >=300 (A) 02/17/2017 1212   UROBILINOGEN 1.0 12/02/2013 1612   NITRITE NEGATIVE 02/17/2017 1212   LEUKOCYTESUR TRACE (A) 02/17/2017 1212   Recent Results (from the past 240 hour(s))  Urine culture     Status: None   Collection Time: 02/17/17 12:12 PM  Result Value Ref Range Status   Specimen Description URINE, CATHETERIZED  Final   Special Requests NONE  Final   Culture   Final    NO GROWTH Performed at [facility], [place] [address], [place], [place] [address]    Report Status 02/18/2017 FINAL  Final      Radiology Studies: No results found.  Scheduled Meds: . atenolol  50 mg Oral BID  . divalproex  500 mg Oral QHS  . fentaNYL (SUBLIMAZE) injection  50 mcg Intravenous Once  . heparin  5,000 Units Subcutaneous Q8H  . insulin aspart  0-9 Units Subcutaneous TID WC  . insulin glargine  30 Units Subcutaneous QHS  . levothyroxine  200 mcg Oral QHS  . lisinopril  20 mg Oral QHS  . morphine  15 mg Oral Q12H  . pregabalin  75 mg Oral BID   Continuous Infusions: . ciprofloxacin Stopped (02/21/17 0628)  . lactated ringers 1,000 mL (02/21/17 0106)  . metronidazole Stopped (02/21/17 1042)     LOS: 2 days   Time spent: 25 minutes.  Kelly Splinter MD [facility] pgr [phone] 02/21/2017, 4:34 PM    [facility] [email or web address] Password [facility] 02/21/2017, 4:31 PM

## 2017-02-22 ENCOUNTER — Encounter (HOSPITAL_COMMUNITY): Payer: Self-pay | Admitting: Nephrology

## 2017-02-22 DIAGNOSIS — G894 Chronic pain syndrome: Secondary | ICD-10-CM

## 2017-02-22 DIAGNOSIS — I10 Essential (primary) hypertension: Secondary | ICD-10-CM

## 2017-02-22 DIAGNOSIS — R112 Nausea with vomiting, unspecified: Secondary | ICD-10-CM

## 2017-02-22 LAB — COMPREHENSIVE METABOLIC PANEL
ALBUMIN: 3 g/dL — AB (ref 3.5–5.0)
ALT: 15 U/L (ref 14–54)
ANION GAP: 6 (ref 5–15)
AST: 27 U/L (ref 15–41)
Alkaline Phosphatase: 54 U/L (ref 38–126)
CO2: 28 mmol/L (ref 22–32)
Calcium: 9 mg/dL (ref 8.9–10.3)
Chloride: 106 mmol/L (ref 101–111)
Creatinine, Ser: 0.67 mg/dL (ref 0.44–1.00)
GFR calc Af Amer: >60 mL/min (ref >60)
GFR calc non Af Amer: >60 mL/min (ref >60)
GLUCOSE: 118 mg/dL — AB (ref 65–99)
POTASSIUM: 4.5 mmol/L (ref 3.5–5.1)
SODIUM: 140 mmol/L (ref 135–145)
Total Bilirubin: 0.3 mg/dL (ref 0.3–1.2)
Total Protein: 6.5 g/dL (ref 6.5–8.1)

## 2017-02-22 LAB — CBC
HEMATOCRIT: 36.9 % (ref 36.0–46.0)
HEMOGLOBIN: 12.5 g/dL (ref 12.0–15.0)
MCH: 29.8 pg (ref 26.0–34.0)
MCHC: 33.9 g/dL (ref 30.0–36.0)
MCV: 87.9 fL (ref 78.0–100.0)
Platelets: 265 10*3/uL (ref 150–400)
RBC: 4.2 MIL/uL (ref 3.87–5.11)
RDW: 13.5 % (ref 11.5–15.5)
WBC: 8.1 10*3/uL (ref 4.0–10.5)

## 2017-02-22 LAB — GLUCOSE, CAPILLARY
GLUCOSE-CAPILLARY: 230 mg/dL — AB (ref 65–99)
GLUCOSE-CAPILLARY: 158 mg/dL — AB (ref 65–99)
Glucose-Capillary: 108 mg/dL — ABNORMAL HIGH (ref 65–99)
Glucose-Capillary: 106 mg/dL — ABNORMAL HIGH (ref 65–99)

## 2017-02-22 MED ORDER — HYDRALAZINE HCL 20 MG/ML IJ SOLN: 20.0000 mg | Freq: Four times a day (QID) | INTRAMUSCULAR | Status: DC | PRN

## 2017-02-22 MED ORDER — LISINOPRIL 20 MG PO TABS
20.0000 mg | ORAL_TABLET | Freq: Every day | ORAL | Status: DC
  Administered 2017-02-22 – 2017-02-23 (×2): 20 mg via ORAL
  Filled 2017-02-22 (×2): qty 1

## 2017-02-22 NOTE — Progress Notes (Signed)
Patient scheduled CT scan was unable to perform due to IV access problem according to the radiology tech. Iv nurse was paged to put on a new IV access but also reported of having difficulty finding a vein even with ultrasound.

## 2017-02-22 NOTE — Progress Notes (Signed)
PROGRESS NOTE  Andrea [name]  FKC:[id number] DOB: 04/25/1950 DOA: 02/17/2017 PCP: Medicine, [facility]   Brief Narrative: Andrea [name] is a 67 y.o. female with a history of IDDM, chronic pain, and remote hx breast CA who presented to the ED 1/28 with LLQ abdominal pain, nausea and vomiting admitted for CT findings consistent with her first episode of acute uncomplicated diverticulitis. Meropenem was started due to a history of ESBL UTI, though urine culture returned negative and antibiotics changed to zosyn.    Assessment & Plan: Principal Problem:   Diverticulitis Active Problems:   Hypertension   Diabetes mellitus without complication (HCC)   Hypothyroid   Epilepsy (HCC)   Nausea & vomiting   Chronic pain  Acute diverticulitis: uncomplicated, first episode.  - abd pain and tenderness improving now, no further diarrhea.  D# 4/5 of IV abx. DC isolation. CT abd not done, will cancel order, seems to be improving.  Advance to full liquids today.  Continue IV cipro/flagyl (has PCN allergy ).  Mobilize  Nausea and vomiting: improving, due to diverticulitis as above. - Will continue IVF's  - IV antiemetics, analgesics as ordered.   History of ESBL UTI: Urine culture negative here  IDDM: At inpatient goal. BS"s well controlled.  - Continue lantus, SSI  Chronic pain:  - Continue home pain regimen as tolerated  HTN: bp's are high - Continue atenolol, resume ACEi, added po hydral and ordered prn hydralazine IV  Hypothyroidism: Last TSH was 26, now improved to 6.323. Cont T4  Seizure disorder: cont depakote  Hx of hysterectomy for endometriosis  Hx of small bowel obstruction treated surgically, early 2000's  Hx breast cancer rx bilat mastectomy/ LUE lymphadenectomy 2014, chemoRx through 2015  DVT prophylaxis: Heparin Code Status: Full Family Communication: None at bedside Disposition Plan: Home when improved  Consultants: None Procedures:  None Antimicrobials:  Meropenem 1/28 - 1/30  Cipro/flagyl 1/30 >> current  Subjective:  Having soft formed stools now, no diarrhea to collect for Cdif.  abd pain imrpoving, nausea better.  Wants to try advancing diet.  No fevers. CT not done d/t IV isseus.   Objective: BP (!) 178/78 (BP Location: Right Arm)   Pulse 69   Temp 98.3 F (36.8 C) (Oral)   Resp 16   Ht 5\' 5"  (1.651 m)   Wt 60 kg (132 lb 4.4 oz)   SpO2 96%   BMI 22.01 kg/m   Gen: 67 y.o. female in no distress  Pulm: Non-labored breathing room air. Clear to auscultation bilaterally.  CV: Regular rate and rhythm. No murmur, rub, or gallop. No JVD, no pedal edema. GI: Abdomen soft, still bilat LQ tender , no peritoneal signs Ext: Warm, no deformities Skin: No rashes, lesions no ulcers Neuro: Alert and oriented. No focal neurological deficits. Psych: Judgement and insight appear normal. Mood & affect appropriate.   CBC: Recent Labs  Lab 02/17/17 0933 02/17/17 1039 02/18/17 0435  WBC 14.8*  --  8.7  NEUTROABS 9.5*  --   --   HGB 14.5 15.6* 12.6  HCT 41.0 46.0 36.7  MCV 86.7  --  87.0  PLT 344  --  749   Basic Metabolic Panel: Recent Labs  Lab 02/17/17 0933 02/17/17 1039 02/18/17 0435 02/20/17 0711  NA 137 139 138 137  K 3.6 3.6 3.8 4.5  CL 96* 96* 102 105  CO2 30  --  27 26  GLUCOSE 219* 218* 129* 86  BUN 15 15 10  5*  CREATININE  0.78 0.80 0.63 0.78  CALCIUM 9.6  --  8.7* 8.8*   Liver Function Tests: Recent Labs  Lab 02/17/17 0933  AST 22  ALT 16  ALKPHOS 76  BILITOT 0.4  PROT 8.4*  ALBUMIN 3.8   Thyroid Function Tests: Recent Labs    02/20/17 0711  TSH 6.323*   Urine analysis:    Component Value Date/Time   COLORURINE YELLOW 02/17/2017 [place] 02/17/2017 1212   LABSPEC 1.023 02/17/2017 1212   PHURINE 5.0 02/17/2017 1212   GLUCOSEU 50 (A) 02/17/2017 1212   HGBUR NEGATIVE 02/17/2017 1212   BILIRUBINUR NEGATIVE 02/17/2017 1212   KETONESUR 20 (A) 02/17/2017 1212    PROTEINUR >=300 (A) 02/17/2017 1212   UROBILINOGEN 1.0 12/02/2013 1612   NITRITE NEGATIVE 02/17/2017 1212   LEUKOCYTESUR TRACE (A) 02/17/2017 1212   Recent Results (from the past 240 hour(s))  Urine culture     Status: None   Collection Time: 02/17/17 12:12 PM  Result Value Ref Range Status   Specimen Description URINE, CATHETERIZED  Final   Special Requests NONE  Final   Culture   Final    NO GROWTH Performed at [facility], [place] [address] [place], [place] [address]    Report Status 02/18/2017 FINAL  Final      Radiology Studies: No results found.  Scheduled Meds: . atenolol  50 mg Oral BID  . divalproex  500 mg Oral QHS  . heparin  5,000 Units Subcutaneous Q8H  . hydrALAZINE  25 mg Oral Q8H  . insulin aspart  0-9 Units Subcutaneous TID WC  . insulin glargine  30 Units Subcutaneous QHS  . levothyroxine  200 mcg Oral QHS  . morphine  15 mg Oral Q12H  . pregabalin  75 mg Oral BID   Continuous Infusions: . ciprofloxacin 400 mg (02/22/17 0541)  . lactated ringers 50 mL/hr at 02/22/17 1101  . metronidazole 500 mg (02/22/17 1100)     LOS: 3 days   Time spent: 25 minutes.  Kelly Splinter MD [facility] pgr [phone] 02/22/2017, 12:06 PM    [facility] [email or web address] Password [facility] 02/22/2017, 12:06 PM

## 2017-02-23 DIAGNOSIS — K5792 Diverticulitis of intestine, part unspecified, without perforation or abscess without bleeding: Secondary | ICD-10-CM

## 2017-02-23 LAB — GLUCOSE, CAPILLARY
Glucose-Capillary: 161 mg/dL — ABNORMAL HIGH (ref 65–99)
Glucose-Capillary: 201 mg/dL — ABNORMAL HIGH (ref 65–99)
Glucose-Capillary: 176 mg/dL — ABNORMAL HIGH (ref 65–99)
Glucose-Capillary: 236 mg/dL — ABNORMAL HIGH (ref 65–99)

## 2017-02-23 NOTE — Progress Notes (Signed)
PROGRESS NOTE  Andrea [name]  PZW:[id number] DOB: [birth year] DOA: 02/17/2017 PCP: Medicine, [facility]   Brief Narrative: Andrea [name] is a 67 y.o. female with a history of IDDM, chronic pain, and remote hx breast CA who presented to the ED 1/28 with LLQ abdominal pain, nausea and vomiting admitted for CT findings consistent with her first episode of acute uncomplicated diverticulitis. Meropenem was started due to a history of ESBL UTI, though urine culture returned negative and antibiotics changed to zosyn.  Patient has been gradually transition to normal diet.   Assessment & Plan: Principal Problem:   Diverticulitis Active Problems:   Hypertension   Diabetes mellitus without complication (HCC)   Hypothyroid   Epilepsy (HCC)   Nausea & vomiting   Chronic pain  #1 Acute diverticulitis: uncomplicated, first episode.  - patient is day 5 of 5 of IV antibiotics.  Diet advanced to a diabetic diet today.  If tolerated may be discharged tomorrow  #2 Nausea and vomiting: -resolved - IV antiemetics, analgesics as ordered.   #3 History of ESBL UTI: Urine culture negative here. No further treatment  #4 IDDM: good control  - Continue lantus, SSI  #5 Chronic pain:  - Continue home pain regimen as tolerated  #6 HTN:  -blood pressure better controlled now. - Continue atenolol, resume ACEi, added po hydral and prn hydralazine IV  #7 Hypothyroidism:  . Cont T4  #8 Seizure disorder: stable. Cont depakote  #9 Hx of hysterectomy for endometriosis: stable and is symptomatic  #10 Hx of small bowel obstruction treated surgically, early 2000's  #11 Hx breast cancer rx bilat mastectomy/ LUE lymphadenectomy 2014, chemoRx through 2015  DVT prophylaxis: Heparin Code Status: Full Family Communication: None at bedside Disposition Plan: Home when improved  Consultants: None Procedures: None Antimicrobials:  Meropenem 1/28 - 1/30  Cipro/flagyl 1/30 >>  current  Subjective:  Patient is tolerating some diet.  Denied any nausea or vomiting.  Has soft stools but no diarrhea.  Objective: BP (!) 165/63 (BP Location: Right Wrist)   Pulse 78   Temp 99.1 F (37.3 C) (Oral)   Resp 18   Ht 5\' 5"  (1.651 m)   Wt 60 kg (132 lb 4.4 oz)   SpO2 98%   BMI 22.01 kg/m   Gen: 67 y.o. female in no distress  Pulm: Non-labored breathing room air. Clear to auscultation bilaterally.  CV: Regular rate and rhythm. No murmur, rub, or gallop. No JVD, no pedal edema. GI: Abdomen soft, still bilat LQ tender , no peritoneal signs Ext: Warm, no deformities Skin: No rashes, lesions no ulcers Neuro: Alert and oriented. No focal neurological deficits. Psych: Judgement and insight appear normal. Mood & affect appropriate.   CBC: Recent Labs  Lab 02/17/17 0933 02/17/17 1039 02/18/17 0435 02/22/17 1211  WBC 14.8*  --  8.7 8.1  NEUTROABS 9.5*  --   --   --   HGB 14.5 15.6* 12.6 12.5  HCT 41.0 46.0 36.7 36.9  MCV 86.7  --  87.0 87.9  PLT 344  --  275 423   Basic Metabolic Panel: Recent Labs  Lab 02/17/17 0933 02/17/17 1039 02/18/17 0435 02/20/17 0711 02/22/17 1211  NA 137 139 138 137 140  K 3.6 3.6 3.8 4.5 4.5  CL 96* 96* 102 105 106  CO2 30  --  27 26 28   GLUCOSE 219* 218* 129* 86 118*  BUN 15 15 10  5* <5*  CREATININE 0.78 0.80 0.63 0.78 0.67  CALCIUM 9.6  --  8.7* 8.8* 9.0   Liver Function Tests: Recent Labs  Lab 02/17/17 0933 02/22/17 1211  AST 22 27  ALT 16 15  ALKPHOS 76 54  BILITOT 0.4 0.3  PROT 8.4* 6.5  ALBUMIN 3.8 3.0*   Thyroid Function Tests: No results for input(s): TSH, T4TOTAL, FREET4, T3FREE, THYROIDAB in the last 72 hours. Urine analysis:    Component Value Date/Time   COLORURINE YELLOW 02/17/2017 [place] 02/17/2017 1212   LABSPEC 1.023 02/17/2017 1212   PHURINE 5.0 02/17/2017 1212   GLUCOSEU 50 (A) 02/17/2017 1212   HGBUR NEGATIVE 02/17/2017 1212   BILIRUBINUR NEGATIVE 02/17/2017 1212    KETONESUR 20 (A) 02/17/2017 1212   PROTEINUR >=300 (A) 02/17/2017 1212   UROBILINOGEN 1.0 12/02/2013 1612   NITRITE NEGATIVE 02/17/2017 1212   LEUKOCYTESUR TRACE (A) 02/17/2017 1212   Recent Results (from the past 240 hour(s))  Urine culture     Status: None   Collection Time: 02/17/17 12:12 PM  Result Value Ref Range Status   Specimen Description URINE, CATHETERIZED  Final   Special Requests NONE  Final   Culture   Final    NO GROWTH Performed at [facility], [place] [address], [place], [place] [address]    Report Status 02/18/2017 FINAL  Final      Radiology Studies: No results found.  Scheduled Meds: . atenolol  50 mg Oral BID  . divalproex  500 mg Oral QHS  . heparin  5,000 Units Subcutaneous Q8H  . hydrALAZINE  25 mg Oral Q8H  . insulin aspart  0-9 Units Subcutaneous TID WC  . insulin glargine  30 Units Subcutaneous QHS  . levothyroxine  200 mcg Oral QHS  . lisinopril  20 mg Oral QHS  . morphine  15 mg Oral Q12H  . pregabalin  75 mg Oral BID   Continuous Infusions: . ciprofloxacin 400 mg (02/23/17 1700)  . lactated ringers 50 mL/hr at 02/23/17 1533  . metronidazole Stopped (02/23/17 1010)     LOS: 4 days   Time spent: 25 minutes.  Barbette Merino MD [facility] pgr [phone] 02/23/2017, 5:21 PM     [facility] [email or web address] Password [facility] 02/23/2017, 5:21 PM

## 2017-02-24 LAB — GLUCOSE, CAPILLARY
GLUCOSE-CAPILLARY: 152 mg/dL — AB (ref 65–99)
Glucose-Capillary: 171 mg/dL — ABNORMAL HIGH (ref 65–99)

## 2017-02-24 MED ORDER — METRONIDAZOLE 500 MG PO TABS: 500.0000 mg | ORAL_TABLET | Freq: Three times a day (TID) | ORAL | 0 refills | Status: DC

## 2017-02-24 MED ORDER — METRONIDAZOLE 500 MG PO TABS
500.0000 mg | ORAL_TABLET | Freq: Three times a day (TID) | ORAL | Status: DC
  Administered 2017-02-24: 500 mg via ORAL
  Filled 2017-02-24: qty 1

## 2017-02-24 MED ORDER — CIPROFLOXACIN HCL 500 MG PO TABS: 500.0000 mg | ORAL_TABLET | Freq: Two times a day (BID) | ORAL | 0 refills | Status: DC

## 2017-02-24 MED ORDER — ONDANSETRON HCL 4 MG PO TABS: 4.0000 mg | ORAL_TABLET | Freq: Four times a day (QID) | ORAL | 0 refills | Status: DC | PRN

## 2017-02-24 MED ORDER — SACCHAROMYCES BOULARDII 250 MG PO CAPS: 250.0000 mg | ORAL_CAPSULE | Freq: Two times a day (BID) | ORAL | 0 refills | Status: DC

## 2017-02-24 NOTE — Care Management Important Message (Signed)
Important Message  Patient Details  Name: Sosha Shepherd MRN: [id number] Date of Birth: [birth year]   Medicare Important Message Given:  Yes    Kerin Salen 02/24/2017, 10:22 AMImportant Message  Patient Details  Name: Annelie Boak MRN: [id number] Date of Birth: 06/15/1950   Medicare Important Message Given:  Yes    Kerin Salen 02/24/2017, 10:22 AM

## 2017-02-24 NOTE — Progress Notes (Signed)
Pt discharged to home with son at bedside.  Pt has all belongings including her walker.  Discussed discharge instructions, verbalized understanding.  Vitals stable, recently took apresoline.  See MAR for more details.  Denies pain.    Iantha Fallen RN 2:29 PM 02/24/2017

## 2017-02-24 NOTE — Progress Notes (Signed)
Inpatient Diabetes Program Recommendations  AACE/ADA: New Consensus Statement on Inpatient Glycemic Control (2015)  Target Ranges:  Prepandial:   less than 140 mg/dL      Peak postprandial:   less than 180 mg/dL (1-2 hours)      Critically ill patients:  140 - 180 mg/dL   Results for KAROLYNN, INFANTINO (MRN [id number]) as of 02/24/2017 09:55  Ref. Range 02/23/2017 07:49 02/23/2017 11:37 02/23/2017 16:41 02/23/2017 21:33  Glucose-Capillary Latest Ref Range: 65 - 99 mg/dL 161 (H)  2 units Novolog 201 (H)  3 units Novolog 176 (H)  2 units Novolog 236 (H)  30 units Lantus   Results for ZAMIA, TYMINSKI (MRN [id number]) as of 02/24/2017 09:55  Ref. Range 02/24/2017 07:54  Glucose-Capillary Latest Ref Range: 65 - 99 mg/dL 171 (H)    Admit with: Acute Diverticulitis  History: DM  Home DM Meds: Lantus 30 units daily  Current Insulin Orders: Lantus 30 units QHS      Novolog Sensitive Correction Scale/ SSI (0-9 units) TID AC     MD- Please consider the following in-hospital insulin adjustments:  1. Increase Lantus slightly to 32 units QHS  2. Start Novolog Meal Coverage: Novolog 3 units TID with meals (hold if pt eats <50% of meal)     --Will follow patient during hospitalization--  Wyn Quaker RN, MSN, CDE Diabetes Coordinator Inpatient Glycemic Control Team Team Pager: [phone] (8a-5p)

## 2017-02-24 NOTE — Discharge Summary (Signed)
Physician Discharge Summary  Andrea [name] FAO:[id number] DOB: 10/18/1950 DOA: 02/17/2017  PCP: [facility], [facility] date: 02/17/2017 Discharge date: 02/24/2017  Admitted From: Home Disposition: Home   Recommendations for Outpatient Follow-up:  1. Follow up with PCP in 1-2 weeks, will need colonoscopy in 6 weeks  Home Health: None Equipment/Devices: None Discharge Condition: Stable CODE STATUS: Full Diet recommendation: Heart healthy/carb-modified   Brief/Interim Summary: Andrea [name] is a 67 y.o. female with a history of IDDM, chronic pain, and remote hx breast CA who presented to the ED 1/28 with LLQ abdominal pain, nausea and vomiting admitted for CT findings consistent with her first episode of acute uncomplicated diverticulitis. Meropenem was started due to a history of ESBL UTI, though urine culture returned negative and antibiotics changed to cipro/flagyl. Over time, nausea and vomiting resolved, abdominal pain improved. She experienced some loose stools that were not watery and remained < 3x/24hrs more consistent with antibiotic-associated diarrhea. This has resolved prior to being able to send sample for studies. The patient's IV has infiltrated with repeated unsuccessful attempts by IV team for replacement. When considering placing a PICC line, the patient states she is eager to discharge to home instead, and understands return precautions.   Discharge Diagnoses:  Principal Problem:   Diverticulitis Active Problems:   Hypertension   Diabetes mellitus without complication ([place])   Hypothyroid   Epilepsy ([place])   Nausea & vomiting   Chronic pain  Acute diverticulitis: uncomplicated, first episode. Improved. Tolerating po without N/V/abd pain.  - Continue cipro/flagyl (has PCN allergy).  Mobilize. - Recommend colonoscopy in 6 weeks  Nausea and vomiting: improved, due to diverticulitis as above. - Continue antiemetics prn at  discharge.  History of ESBL UTI: Urine culture negative here  IDDM: At inpatient goal. BS's well controlled.  - Continue lantus, SSI  Chronic pain:  - Continue home pain regimen as tolerated  HTN:  - Continue home medications. Added po hydralazine while inpatient due to high BPs thought to be related to stress/pain.  Hypothyroidism: Last TSH was 26, now improved to 6.323. Cont T4  Seizure disorder: cont depakote  Hx of hysterectomy for endometriosis  Hx of small bowel obstruction treated surgically, early 2000's  Hx breast cancer rx bilat mastectomy/ LUE lymphadenectomy 2014, chemoRx through 2015  Discharge Instructions Discharge Instructions    Diet - low sodium heart healthy   Complete by:  As directed    Discharge instructions   Complete by:  As directed    You were admitted for acute diverticulitis and have slowly improved with antibiotics. You are stable for discharge with the following recommendations:  - continue taking antibiotics: Ciprofloxain twice daily and flagyl three times daily for the next 7 days; to help with anti-associated diarrhea, take florastor (probiotic) and eat plenty of yogurt.  - You may take your home medications for pain and zofran as needed for nausea/vomiting.  - Follow up with your primary doctor in the next 1 - 2 weeks; you will also need a referral to a gastroenterologist (GI doctor) for a colonoscopy in the next 6 weeks. - You will need to return for care sooner if you experience worsening pain, fever, or uncontrollable vomiting.   Increase activity slowly   Complete by:  As directed      Allergies as of 02/24/2017      Reactions   Aspirin Nausea And Vomiting   Maxzide [hydrochlorothiazide W-triamterene] Swelling   Fluid retention   Metformin And Related Diarrhea  Nsaids Nausea And Vomiting   Other Other (See Comments)   All steroids produce psychosis per pt Artificial sweeteners produce nausea and upset stomach.   Pravachol  [pravastatin] Other (See Comments)   unknown   Stadol [butorphanol] Other (See Comments)   Toradol, etc And related- hallucinations   Toradol [ketorolac Tromethamine] Other (See Comments)   hallucinations   Vistaril [hydroxyzine Hcl] Other (See Comments)   unknown   Erythromycin Itching, Rash   Morphine And Related Hives, Rash   Iv morphine.   Penicillins Hives, Itching, Rash   Has patient had a PCN reaction causing immediate rash, facial/tongue/throat swelling, SOB or lightheadedness with hypotension: yes Has patient had a PCN reaction causing severe rash involving mucus membranes or skin necrosis: unknown Has patient had a PCN reaction that required hospitalization : unknown Has patient had a PCN reaction occurring within the last 10 years: no If all of the above answers are "NO", then may proceed with Cephalosporin use.      Medication List    TAKE these medications   ALPRAZolam 0.5 MG tablet Commonly known as:  XANAX TAKE 1 TABLET BY MOUTH 4 TIMES A DAY AS NEEDED FOR ANXIETY   atenolol 50 MG tablet Commonly known as:  TENORMIN Take 50 mg by mouth 2 (two) times daily.   AZO-CRANBERRY PO Take 1 tablet by mouth daily.   ciprofloxacin 500 MG tablet Commonly known as:  CIPRO Take 1 tablet (500 mg total) by mouth 2 (two) times daily.   divalproex 500 MG DR tablet Commonly known as:  DEPAKOTE Take 500 mg by mouth at bedtime.   EMBEDA 20-0.8 MG Cpcr Generic drug:  Morphine-Naltrexone Take 1 tablet by mouth daily.   insulin glargine 100 UNIT/ML injection Commonly known as:  LANTUS Inject 30 Units into the skin daily.   levothyroxine 200 MCG tablet Commonly known as:  SYNTHROID, LEVOTHROID Take 1 tablet (200 mcg total) by mouth at bedtime.   lisinopril 10 MG tablet Commonly known as:  PRINIVIL,ZESTRIL Take 10 mg by mouth at bedtime.   metroNIDAZOLE 500 MG tablet Commonly known as:  FLAGYL Take 1 tablet (500 mg total) by mouth every 8 (eight) hours.   miconazole  100 MG vaginal suppository Commonly known as:  MICOTIN Place 100 mg vaginally as needed (Yeast infection).   ondansetron 4 MG/2ML Soln injection Commonly known as:  ZOFRAN Inject 2 mLs (4 mg total) every 6 (six) hours as needed into the vein for nausea or vomiting. What changed:  Another medication with the same name was added. Make sure you understand how and when to take each.   ondansetron 4 MG tablet Commonly known as:  ZOFRAN Take 1 tablet (4 mg total) by mouth every 6 (six) hours as needed for nausea or vomiting. What changed:  You were already taking a medication with the same name, and this prescription was added. Make sure you understand how and when to take each.   oxyCODONE-acetaminophen 10-325 MG tablet Commonly known as:  PERCOCET Take 1 tablet by mouth 3 (three) times daily as needed for pain.   pregabalin 75 MG capsule Commonly known as:  LYRICA Take 1 capsule (75 mg total) 2 (two) times daily by mouth.   promethazine 25 MG suppository Commonly known as:  PHENERGAN Place 1 suppository (25 mg total) rectally every 6 (six) hours as needed for nausea or vomiting.   saccharomyces boulardii 250 MG capsule Commonly known as:  FLORASTOR Take 1 capsule (250 mg total) by mouth 2 (  two) times daily.      Follow-up Information    Medicine, [facility].   Specialty:  Family Medicine Why:  Will need referral to gastoenterology for colonoscopy in about 6 weeks. Contact information: [place] [address] [phone]          Allergies  Allergen Reactions  . Aspirin Nausea And Vomiting  . Maxzide [Hydrochlorothiazide W-Triamterene] Swelling    Fluid retention  . Metformin And Related Diarrhea  . Nsaids Nausea And Vomiting  . Other Other (See Comments)    All steroids produce psychosis per pt Artificial sweeteners produce nausea and upset stomach.  Gregary Cromer [Pravastatin] Other (See Comments)    unknown  .  Stadol [Butorphanol] Other (See Comments)    Toradol, etc And related- hallucinations  . Toradol [Ketorolac Tromethamine] Other (See Comments)    hallucinations  . Vistaril [Hydroxyzine Hcl] Other (See Comments)    unknown  . Erythromycin Itching and Rash  . Morphine And Related Hives and Rash    Iv morphine.  . Penicillins Hives, Itching and Rash    Has patient had a PCN reaction causing immediate rash, facial/tongue/throat swelling, SOB or lightheadedness with hypotension: yes Has patient had a PCN reaction causing severe rash involving mucus membranes or skin necrosis: unknown Has patient had a PCN reaction that required hospitalization : unknown Has patient had a PCN reaction occurring within the last 10 years: no If all of the above answers are "NO", then may proceed with Cephalosporin use.     Consultations:  None  Procedures/Studies: Dg Chest 2 View  Result Date: 02/17/2017 CLINICAL DATA:  Three days of vomiting and lower abdominal pain. Also dysuria. History of hypertension, diabetes, previous fall with rib fractures, breast malignancy. EXAM: CHEST  2 VIEW COMPARISON:  Portable chest x-ray of January 07, 2017 FINDINGS: The lungs are adequately inflated and clear. The heart and pulmonary vascularity are normal. The mediastinum is normal in width. There are bilateral breast implants present. There is mild loss of height of the bodies of T8 and T9 which is stable. IMPRESSION: There is no active cardiopulmonary disease. Electronically Signed   By: David  Martinique M.D.   On: 02/17/2017 10:15   Ct Abdomen Pelvis W Contrast  Result Date: 02/17/2017 CLINICAL DATA:  Abdominal pain, vomiting EXAM: CT ABDOMEN AND PELVIS WITH CONTRAST TECHNIQUE: Multidetector CT imaging of the abdomen and pelvis was performed using the standard protocol following bolus administration of intravenous contrast. CONTRAST:  138mL ISOVUE-300 IOPAMIDOL (ISOVUE-300) INJECTION 61% COMPARISON:  None. FINDINGS: Lower  chest: Bilateral breast implants partially imaged. Heart is upper limits normal in size. Dependent atelectasis in the lung bases. No effusions. Hepatobiliary: No focal liver abnormality is seen. Status post cholecystectomy. No biliary dilatation. Pancreas: No focal abnormality or ductal dilatation. Spleen: No focal abnormality.  Normal size. Adrenals/Urinary Tract: Left pelvic kidney again noted. Small cysts in the upper pole of the left kidney and midpole of the right kidney. No hydronephrosis. Urinary bladder and adrenal glands unremarkable. Stomach/Bowel: Distal descending colonic and sigmoid diverticulosis. Slight stranding around the distal descending colon compatible with early active diverticulitis. Moderate stool burden in the colon. Stomach and small bowel decompressed, unremarkable. Vascular/Lymphatic: Aortic atherosclerosis. No enlarged abdominal or pelvic lymph nodes. Reproductive: Prior hysterectomy.  No adnexal masses. Other: No free fluid or free air. Musculoskeletal: Mild compression fracture through the superior endplate of L3 is stable since prior study. IMPRESSION: Scattered left colonic diverticulosis. Slight stranding around  the distal descending colon suggesting early active diverticulitis. Moderate stool burden in the colon. Left pelvic kidney. Small cysts in the kidneys bilaterally. No hydronephrosis. Prior hysterectomy and cholecystectomy. Electronically Signed   By: Rolm Baptise M.D.   On: 02/17/2017 17:57   Subjective: Abdominal pain resolved. Tolerating diet without nausea or vomiting. Wants to go home. Has had looser stools than usual, but not watery, not associated with abdominal pain. Lost IV and does not want another attempt at reinsertion.   Discharge Exam: Vitals:   02/24/17 0542 02/24/17 1103  BP: (!) 165/62 (!) 182/73  Pulse: 75 72  Resp: 18   Temp: 98.6 F (37 C)   SpO2: 95%    General: Pt is alert, awake, not in acute distress Cardiovascular: RRR, S1/S2 +, no  rubs, no gallops Respiratory: CTA bilaterally, no wheezing, no rhonchi Abdominal: Soft, normoactive bowel sounds, not tender or distended.  Extremities: No edema, no cyanosis Skin: Turgor normal  Labs: BNP (last 3 results) No results for input(s): BNP in the last 8760 hours. Basic Metabolic Panel: Recent Labs  Lab 02/18/17 0435 02/20/17 0711 02/22/17 1211  NA 138 137 140  K 3.8 4.5 4.5  CL 102 105 106  CO2 27 26 28   GLUCOSE 129* 86 118*  BUN 10 5* <5*  CREATININE 0.63 0.78 0.67  CALCIUM 8.7* 8.8* 9.0   Liver Function Tests: Recent Labs  Lab 02/22/17 1211  AST 27  ALT 15  ALKPHOS 54  BILITOT 0.3  PROT 6.5  ALBUMIN 3.0*   No results for input(s): LIPASE, AMYLASE in the last 168 hours. No results for input(s): AMMONIA in the last 168 hours. CBC: Recent Labs  Lab 02/18/17 0435 02/22/17 1211  WBC 8.7 8.1  HGB 12.6 12.5  HCT 36.7 36.9  MCV 87.0 87.9  PLT 275 265   Cardiac Enzymes: No results for input(s): CKTOTAL, CKMB, CKMBINDEX, TROPONINI in the last 168 hours. BNP: Invalid input(s): POCBNP CBG: Recent Labs  Lab 02/23/17 0749 02/23/17 1137 02/23/17 1641 02/23/17 2133 02/24/17 0754  GLUCAP 161* 201* 176* 236* 171*   D-Dimer No results for input(s): DDIMER in the last 72 hours. Hgb A1c No results for input(s): HGBA1C in the last 72 hours. Lipid Profile No results for input(s): CHOL, HDL, LDLCALC, TRIG, CHOLHDL, LDLDIRECT in the last 72 hours. Thyroid function studies No results for input(s): TSH, T4TOTAL, T3FREE, THYROIDAB in the last 72 hours.  Invalid input(s): FREET3 Anemia work up No results for input(s): VITAMINB12, FOLATE, FERRITIN, TIBC, IRON, RETICCTPCT in the last 72 hours. Urinalysis    Component Value Date/Time   COLORURINE YELLOW 02/17/2017 [place] 02/17/2017 1212   LABSPEC 1.023 02/17/2017 1212   PHURINE 5.0 02/17/2017 1212   GLUCOSEU 50 (A) 02/17/2017 1212   HGBUR NEGATIVE 02/17/2017 1212   BILIRUBINUR  NEGATIVE 02/17/2017 1212   KETONESUR 20 (A) 02/17/2017 1212   PROTEINUR >=300 (A) 02/17/2017 1212   UROBILINOGEN 1.0 12/02/2013 1612   NITRITE NEGATIVE 02/17/2017 1212   LEUKOCYTESUR TRACE (A) 02/17/2017 1212    Microbiology Recent Results (from the past 240 hour(s))  Urine culture     Status: None   Collection Time: 02/17/17 12:12 PM  Result Value Ref Range Status   Specimen Description URINE, CATHETERIZED  Final   Special Requests NONE  Final   Culture   Final    NO GROWTH Performed at [facility], [place] [address], [place], [place] [address]    Report Status 02/18/2017 FINAL  Final    Time coordinating discharge: Approximately 40 minutes  Vance Gather, MD  [facility] 02/24/2017, 11:25 AM Pager [phone]

## 2017-03-17 ENCOUNTER — Inpatient Hospital Stay (HOSPITAL_COMMUNITY)
Admission: EM | Admit: 2017-03-17 | Discharge: 2017-03-20 | DRG: 372 | Disposition: A | Discharge status: Home Health | Payer: Medicare Other | Attending: Family Medicine | Admitting: Family Medicine

## 2017-03-17 ENCOUNTER — Emergency Department (HOSPITAL_COMMUNITY): Payer: Medicare Other

## 2017-03-17 ENCOUNTER — Encounter (HOSPITAL_COMMUNITY): Payer: Self-pay | Admitting: Emergency Medicine

## 2017-03-17 ENCOUNTER — Other Ambulatory Visit: Payer: Self-pay

## 2017-03-17 DIAGNOSIS — R112 Nausea with vomiting, unspecified: Secondary | ICD-10-CM | POA: Diagnosis not present

## 2017-03-17 DIAGNOSIS — A0472 Enterocolitis due to Clostridium difficile, not specified as recurrent: Secondary | ICD-10-CM | POA: Diagnosis not present

## 2017-03-17 DIAGNOSIS — G894 Chronic pain syndrome: Secondary | ICD-10-CM

## 2017-03-17 DIAGNOSIS — F419 Anxiety disorder, unspecified: Secondary | ICD-10-CM | POA: Diagnosis present

## 2017-03-17 DIAGNOSIS — G40909 Epilepsy, unspecified, not intractable, without status epilepticus: Secondary | ICD-10-CM | POA: Diagnosis not present

## 2017-03-17 DIAGNOSIS — E039 Hypothyroidism, unspecified: Secondary | ICD-10-CM | POA: Diagnosis present

## 2017-03-17 DIAGNOSIS — Z87898 Personal history of other specified conditions: Secondary | ICD-10-CM

## 2017-03-17 DIAGNOSIS — Z853 Personal history of malignant neoplasm of breast: Secondary | ICD-10-CM

## 2017-03-17 DIAGNOSIS — R1030 Lower abdominal pain, unspecified: Secondary | ICD-10-CM

## 2017-03-17 DIAGNOSIS — Z794 Long term (current) use of insulin: Secondary | ICD-10-CM

## 2017-03-17 DIAGNOSIS — E876 Hypokalemia: Secondary | ICD-10-CM | POA: Diagnosis present

## 2017-03-17 DIAGNOSIS — Z9013 Acquired absence of bilateral breasts and nipples: Secondary | ICD-10-CM

## 2017-03-17 DIAGNOSIS — K219 Gastro-esophageal reflux disease without esophagitis: Secondary | ICD-10-CM | POA: Diagnosis present

## 2017-03-17 DIAGNOSIS — N179 Acute kidney failure, unspecified: Secondary | ICD-10-CM | POA: Diagnosis not present

## 2017-03-17 DIAGNOSIS — E119 Type 2 diabetes mellitus without complications: Secondary | ICD-10-CM | POA: Diagnosis present

## 2017-03-17 DIAGNOSIS — K5792 Diverticulitis of intestine, part unspecified, without perforation or abscess without bleeding: Secondary | ICD-10-CM | POA: Diagnosis present

## 2017-03-17 DIAGNOSIS — G8929 Other chronic pain: Secondary | ICD-10-CM | POA: Diagnosis present

## 2017-03-17 DIAGNOSIS — IMO0001 Reserved for inherently not codable concepts without codable children: Secondary | ICD-10-CM

## 2017-03-17 DIAGNOSIS — R159 Full incontinence of feces: Secondary | ICD-10-CM | POA: Diagnosis present

## 2017-03-17 DIAGNOSIS — I10 Essential (primary) hypertension: Secondary | ICD-10-CM | POA: Diagnosis present

## 2017-03-17 DIAGNOSIS — Z90710 Acquired absence of both cervix and uterus: Secondary | ICD-10-CM

## 2017-03-17 DIAGNOSIS — E869 Volume depletion, unspecified: Secondary | ICD-10-CM | POA: Diagnosis present

## 2017-03-17 DIAGNOSIS — K529 Noninfective gastroenteritis and colitis, unspecified: Secondary | ICD-10-CM

## 2017-03-17 DIAGNOSIS — Z79890 Hormone replacement therapy (postmenopausal): Secondary | ICD-10-CM

## 2017-03-17 LAB — COMPREHENSIVE METABOLIC PANEL
ALT: 17 U/L (ref 14–54)
ANION GAP: 16 — AB (ref 5–15)
AST: 18 U/L (ref 15–41)
Albumin: 3.2 g/dL — ABNORMAL LOW (ref 3.5–5.0)
Alkaline Phosphatase: 72 U/L (ref 38–126)
BUN: 51 mg/dL — ABNORMAL HIGH (ref 6–20)
CO2: 26 mmol/L (ref 22–32)
Calcium: 9.3 mg/dL (ref 8.9–10.3)
Chloride: 96 mmol/L — ABNORMAL LOW (ref 101–111)
Creatinine, Ser: 1.26 mg/dL — ABNORMAL HIGH (ref 0.44–1.00)
GFR calc Af Amer: 51 mL/min — ABNORMAL LOW (ref >60)
GFR calc non Af Amer: 44 mL/min — ABNORMAL LOW (ref >60)
GLUCOSE: 283 mg/dL — AB (ref 65–99)
POTASSIUM: 3.7 mmol/L (ref 3.5–5.1)
Sodium: 138 mmol/L (ref 135–145)
TOTAL PROTEIN: 7.9 g/dL (ref 6.5–8.1)
Total Bilirubin: 0.8 mg/dL (ref 0.3–1.2)

## 2017-03-17 LAB — CBG MONITORING, ED
GLUCOSE-CAPILLARY: 144 mg/dL — AB (ref 65–99)
Glucose-Capillary: 224 mg/dL — ABNORMAL HIGH (ref 65–99)

## 2017-03-17 LAB — CBC
HEMATOCRIT: 40.5 % (ref 36.0–46.0)
HEMOGLOBIN: 14 g/dL (ref 12.0–15.0)
MCH: 30.2 pg (ref 26.0–34.0)
MCHC: 34.6 g/dL (ref 30.0–36.0)
MCV: 87.5 fL (ref 78.0–100.0)
Platelets: 402 10*3/uL — ABNORMAL HIGH (ref 150–400)
RBC: 4.63 MIL/uL (ref 3.87–5.11)
RDW: 13.3 % (ref 11.5–15.5)
WBC: 16.5 10*3/uL — AB (ref 4.0–10.5)

## 2017-03-17 LAB — MAGNESIUM: Magnesium: 1.9 mg/dL (ref 1.7–2.4)

## 2017-03-17 LAB — LIPASE, BLOOD: Lipase: 19 U/L (ref 11–51)

## 2017-03-17 MED ORDER — SACCHAROMYCES BOULARDII 250 MG PO CAPS
250.0000 mg | ORAL_CAPSULE | Freq: Two times a day (BID) | ORAL | Status: DC
  Administered 2017-03-17 – 2017-03-20 (×6): 250 mg via ORAL
  Filled 2017-03-17 (×7): qty 1

## 2017-03-17 MED ORDER — OXYCODONE-ACETAMINOPHEN 10-325 MG PO TABS: 1.0000 | ORAL_TABLET | Freq: Three times a day (TID) | ORAL | Status: DC | PRN

## 2017-03-17 MED ORDER — ONDANSETRON HCL 4 MG PO TABS
4.0000 mg | ORAL_TABLET | Freq: Four times a day (QID) | ORAL | Status: DC | PRN
  Administered 2017-03-19: 4 mg via ORAL
  Filled 2017-03-17: qty 1

## 2017-03-17 MED ORDER — LISINOPRIL 10 MG PO TABS
10.0000 mg | ORAL_TABLET | Freq: Every day | ORAL | Status: DC
  Administered 2017-03-17 – 2017-03-19 (×3): 10 mg via ORAL
  Filled 2017-03-17 (×4): qty 1

## 2017-03-17 MED ORDER — IOPAMIDOL (ISOVUE-300) INJECTION 61%
INTRAVENOUS | Status: AC
  Filled 2017-03-17: qty 75

## 2017-03-17 MED ORDER — OXYCODONE-ACETAMINOPHEN 5-325 MG PO TABS
1.0000 | ORAL_TABLET | Freq: Three times a day (TID) | ORAL | Status: DC | PRN
  Administered 2017-03-17 – 2017-03-20 (×8): 1 via ORAL
  Filled 2017-03-17 (×8): qty 1

## 2017-03-17 MED ORDER — INSULIN GLARGINE 100 UNIT/ML ~~LOC~~ SOLN
30.0000 [IU] | Freq: Every day | SUBCUTANEOUS | Status: DC
Start: 2017-03-17 — End: 2017-03-20
  Administered 2017-03-17 – 2017-03-20 (×4): 30 [IU] via SUBCUTANEOUS
  Filled 2017-03-17 (×4): qty 0.3

## 2017-03-17 MED ORDER — OXYCODONE HCL 5 MG PO TABS
5.0000 mg | ORAL_TABLET | Freq: Three times a day (TID) | ORAL | Status: DC | PRN
Start: 2017-03-17 — End: 2017-03-20
  Administered 2017-03-17 – 2017-03-20 (×9): 5 mg via ORAL
  Filled 2017-03-17 (×9): qty 1

## 2017-03-17 MED ORDER — LEVOTHYROXINE SODIUM 100 MCG PO TABS
200.0000 ug | ORAL_TABLET | Freq: Every day | ORAL | Status: DC
  Administered 2017-03-17 – 2017-03-19 (×3): 200 ug via ORAL
  Filled 2017-03-17: qty 1
  Filled 2017-03-17 (×3): qty 2

## 2017-03-17 MED ORDER — SODIUM CHLORIDE 0.9 % IV SOLN: 250.0000 mL | INTRAVENOUS | Status: DC | PRN

## 2017-03-17 MED ORDER — ATENOLOL 50 MG PO TABS
50.0000 mg | ORAL_TABLET | Freq: Two times a day (BID) | ORAL | Status: DC
  Administered 2017-03-17 – 2017-03-20 (×6): 50 mg via ORAL
  Filled 2017-03-17 (×8): qty 1

## 2017-03-17 MED ORDER — SODIUM CHLORIDE 0.9% FLUSH
3.0000 mL | Freq: Two times a day (BID) | INTRAVENOUS | Status: DC
  Administered 2017-03-17 – 2017-03-20 (×5): 3 mL via INTRAVENOUS

## 2017-03-17 MED ORDER — SODIUM CHLORIDE 0.9 % IV BOLUS (SEPSIS)
1000.0000 mL | Freq: Once | INTRAVENOUS | Status: AC
Start: 2017-03-17 — End: 2017-03-17
  Administered 2017-03-17: 1000 mL via INTRAVENOUS

## 2017-03-17 MED ORDER — METRONIDAZOLE IN NACL 5-0.79 MG/ML-% IV SOLN
500.0000 mg | Freq: Three times a day (TID) | INTRAVENOUS | Status: DC
Start: 2017-03-17 — End: 2017-03-18
  Administered 2017-03-17 – 2017-03-18 (×3): 500 mg via INTRAVENOUS
  Filled 2017-03-17 (×3): qty 100

## 2017-03-17 MED ORDER — ONDANSETRON HCL 4 MG/2ML IJ SOLN: 4.0000 mg | Freq: Four times a day (QID) | INTRAMUSCULAR | Status: DC | PRN

## 2017-03-17 MED ORDER — SODIUM CHLORIDE 0.9% FLUSH: 3.0000 mL | INTRAVENOUS | Status: DC | PRN

## 2017-03-17 MED ORDER — INSULIN ASPART 100 UNIT/ML ~~LOC~~ SOLN
0.0000 [IU] | Freq: Three times a day (TID) | SUBCUTANEOUS | Status: DC
  Administered 2017-03-17: 3 [IU] via SUBCUTANEOUS
  Administered 2017-03-18: 1 [IU] via SUBCUTANEOUS
  Administered 2017-03-18 – 2017-03-19 (×2): 2 [IU] via SUBCUTANEOUS
  Administered 2017-03-19: 3 [IU] via SUBCUTANEOUS
  Administered 2017-03-19: 1 [IU] via SUBCUTANEOUS
  Administered 2017-03-20 (×2): 3 [IU] via SUBCUTANEOUS
  Administered 2017-03-20: 1 [IU] via SUBCUTANEOUS
  Filled 2017-03-17: qty 1

## 2017-03-17 MED ORDER — FENTANYL CITRATE (PF) 100 MCG/2ML IJ SOLN
50.0000 ug | Freq: Once | INTRAMUSCULAR | Status: AC
  Administered 2017-03-17: 50 ug via INTRAVENOUS
  Filled 2017-03-17: qty 2

## 2017-03-17 MED ORDER — DIVALPROEX SODIUM 250 MG PO DR TAB
500.0000 mg | DELAYED_RELEASE_TABLET | Freq: Every day | ORAL | Status: DC
  Administered 2017-03-17 – 2017-03-18 (×2): 500 mg via ORAL
  Administered 2017-03-19: 250 mg via ORAL
  Filled 2017-03-17 (×3): qty 2

## 2017-03-17 MED ORDER — ACETAMINOPHEN 650 MG RE SUPP
650.0000 mg | Freq: Four times a day (QID) | RECTAL | Status: DC | PRN
Start: 2017-03-17 — End: 2017-03-20

## 2017-03-17 MED ORDER — ACETAMINOPHEN 325 MG PO TABS: 650.0000 mg | ORAL_TABLET | Freq: Four times a day (QID) | ORAL | Status: DC | PRN

## 2017-03-17 MED ORDER — SODIUM CHLORIDE 0.9 % IJ SOLN
INTRAMUSCULAR | Status: AC
  Filled 2017-03-17: qty 50

## 2017-03-17 MED ORDER — PREGABALIN 75 MG PO CAPS
75.0000 mg | ORAL_CAPSULE | Freq: Two times a day (BID) | ORAL | Status: DC
  Administered 2017-03-17 – 2017-03-20 (×6): 75 mg via ORAL
  Filled 2017-03-17 (×6): qty 1

## 2017-03-17 MED ORDER — ONDANSETRON HCL 4 MG/2ML IJ SOLN: 4.0000 mg | Freq: Once | INTRAMUSCULAR | Status: DC | PRN

## 2017-03-17 NOTE — H&P (Signed)
History and Physical    Andrea [name] KZS:[id number] DOB: 1950/03/11 DOA: 03/17/2017  PCP: [facility], [facility]  Patient coming from: Home  Chief Complaint: Nausea vomiting and diarrhea  HPI: Andrea [name] is a 67 y.o. female with medical history significant of diabetes, hypertension, recent diagnosis and hospitalization for diverticulitis the beginning of this month was discharged on Cipro and Flagyl.  She had a follow-up with her primary care physician last week.  During that time she was still having some mild diarrhea which she told her primary care doctor.  He did some stool studies and her stool studies came back with C. difficile on Friday afternoon.  Today is Monday.  She started to take Flagyl again on Friday but through the weekend she started to get worse with a lot more diarrhea associated with nausea and vomiting.  She cannot hold down her Flagyl every time she tries to take that she throws it back up.  She started running fever last night up to over 101.  Patient is gotten to the point where she has to wear depends because she is becoming incontinent of her stool.  Patient is being referred for admission for C. difficile colitis and the inability to take oral medications.   Review of Systems: As per HPI otherwise 10 point review of systems negative.   Past Medical History:  Diagnosis Date  . Breast cancer ([place])    bilat mastectomy and LUE lymphadenectomy 2014, chemoRx 2014 - 15  . Diabetes mellitus without complication ([place])   . Esophageal reflux   . Hypertension   . Hypothyroid   . Migraine   . Seizure [facility])     Past Surgical History:  Procedure Laterality Date  . ABDOMINAL HYSTERECTOMY     1984 , done for ruptured cyst and endometriosis  . APPENDECTOMY    . CHOLECYSTECTOMY     1980's  . EYE SURGERY  08/28/2015   Right eye  . KNEE SURGERY Left   . MASTECTOMY     bilateral  . Open surgery for bowel obstruction, 2000's       reports  that  has never smoked. she has never used smokeless tobacco. She reports that she does not drink alcohol or use drugs.  Allergies  Allergen Reactions  . Aspirin Nausea And Vomiting  . Maxzide [Hydrochlorothiazide W-Triamterene] Swelling    Fluid retention  . Metformin And Related Diarrhea  . Nsaids Nausea And Vomiting  . Other Other (See Comments)    All steroids produce psychosis per pt Artificial sweeteners produce nausea and upset stomach.  Gregary Cromer [Pravastatin] Other (See Comments)    unknown  . Stadol [Butorphanol] Other (See Comments)    Toradol, etc And related- hallucinations  . Toradol [Ketorolac Tromethamine] Other (See Comments)    hallucinations  . Vistaril [Hydroxyzine Hcl] Other (See Comments)    unknown  . Erythromycin Itching and Rash  . Morphine And Related Hives and Rash    Iv morphine.  . Penicillins Hives, Itching and Rash    Has patient had a PCN reaction causing immediate rash, facial/tongue/throat swelling, SOB or lightheadedness with hypotension: yes Has patient had a PCN reaction causing severe rash involving mucus membranes or skin necrosis: unknown Has patient had a PCN reaction that required hospitalization : unknown Has patient had a PCN reaction occurring within the last 10 years: no If all of the above answers are "NO", then may proceed with Cephalosporin use.     Family History  Problem  Relation Age of Onset  . Sudden Cardiac Death Neg Hx     Prior to Admission medications   Medication Sig Start Date End Date Taking? Authorizing Provider  ALPRAZolam (XANAX) 0.5 MG tablet TAKE 1 TABLET BY MOUTH 4 TIMES A DAY AS NEEDED FOR ANXIETY 11/21/16  Yes [provider]  atenolol (TENORMIN) 50 MG tablet Take 50 mg by mouth 2 (two) times daily.    Yes [provider]  divalproex (DEPAKOTE) 500 MG DR tablet Take 500 mg by mouth at bedtime.    Yes [provider]  EMBEDA 20-0.8 MG CPCR Take 1 tablet by mouth daily. 10/24/16  Yes  [provider]  insulin glargine (LANTUS) 100 UNIT/ML injection Inject 30 Units into the skin daily.    Yes [provider]  levothyroxine (SYNTHROID, LEVOTHROID) 200 MCG tablet Take 1 tablet (200 mcg total) by mouth at bedtime. 01/12/17  Yes Lavina Hamman, MD  lisinopril (PRINIVIL,ZESTRIL) 10 MG tablet Take 10 mg by mouth at bedtime.    Yes [provider]  metroNIDAZOLE (FLAGYL) 500 MG tablet Take 1 tablet (500 mg total) by mouth every 8 (eight) hours. 02/24/17  Yes Patrecia Pour, MD  oxyCODONE-acetaminophen (PERCOCET) 10-325 MG tablet Take 1 tablet by mouth 3 (three) times daily as needed for pain. 02/03/17  Yes [provider]  pregabalin (LYRICA) 75 MG capsule Take 1 capsule (75 mg total) 2 (two) times daily by mouth. 11/24/16  Yes Osei-Bonsu, Iona Beard, MD  saccharomyces boulardii (FLORASTOR) 250 MG capsule Take 1 capsule (250 mg total) by mouth 2 (two) times daily. 02/24/17  Yes Patrecia Pour, MD  ciprofloxacin (CIPRO) 500 MG tablet Take 1 tablet (500 mg total) by mouth 2 (two) times daily. Patient not taking: Reported on 03/17/2017 02/24/17   Patrecia Pour, MD  ondansetron (ZOFRAN) 4 MG tablet Take 1 tablet (4 mg total) by mouth every 6 (six) hours as needed for nausea or vomiting. Patient not taking: Reported on 03/17/2017 02/24/17   Patrecia Pour, MD  ondansetron [facility]) 4 MG/2ML SOLN injection Inject 2 mLs (4 mg total) every 6 (six) hours as needed into the vein for nausea or vomiting. Patient not taking: Reported on 03/17/2017 11/24/16   Benito Mccreedy, MD  promethazine (PHENERGAN) 25 MG suppository Place 1 suppository (25 mg total) rectally every 6 (six) hours as needed for nausea or vomiting. Patient not taking: Reported on 03/17/2017 11/30/15   Frederica Kuster, PA-C    Physical Exam: Vitals:   03/17/17 1056 03/17/17 1059 03/17/17 1329  BP:  99/64 139/68  Pulse: 84  90  Resp: 18  18  Temp: 97.9 F (36.6 C)    TempSrc: Oral    SpO2: 97%  97%       Constitutional: NAD, calm, comfortable Vitals:   03/17/17 1056 03/17/17 1059 03/17/17 1329  BP:  99/64 139/68  Pulse: 84  90  Resp: 18  18  Temp: 97.9 F (36.6 C)    TempSrc: Oral    SpO2: 97%  97%   Eyes: PERRL, lids and conjunctivae normal ENMT: Mucous membranes are moist. Posterior pharynx clear of any exudate or lesions.Normal dentition.  Neck: normal, supple, no masses, no thyromegaly Respiratory: clear to auscultation bilaterally, no wheezing, no crackles. Normal respiratory effort. No accessory muscle use.  Cardiovascular: Regular rate and rhythm, no murmurs / rubs / gallops. No extremity edema. 2+ pedal pulses. No carotid bruits.  Abdomen: no tenderness, no masses palpated. No hepatosplenomegaly. Bowel sounds  positive.  Musculoskeletal: no clubbing / cyanosis. No joint deformity upper and lower extremities. Good ROM, no contractures. Normal muscle tone.  Skin: no rashes, lesions, ulcers. No induration Neurologic: CN 2-12 grossly intact. Sensation intact, DTR normal. Strength 5/5 in all 4.  Psychiatric: Normal judgment and insight. Alert and oriented x 3. Normal mood.    Labs on Admission: I have personally reviewed following labs and imaging studies  CBC: Recent Labs  Lab 03/17/17 1157  WBC 16.5*  HGB 14.0  HCT 40.5  MCV 87.5  PLT 101*   Basic Metabolic Panel: Recent Labs  Lab 03/17/17 1157  NA 138  K 3.7  CL 96*  CO2 26  GLUCOSE 283*  BUN 51*  CREATININE 1.26*  CALCIUM 9.3  MG 1.9   GFR: CrCl cannot be calculated (Unknown ideal weight.). Liver Function Tests: Recent Labs  Lab 03/17/17 1157  AST 18  ALT 17  ALKPHOS 72  BILITOT 0.8  PROT 7.9  ALBUMIN 3.2*   Recent Labs  Lab 03/17/17 1157  LIPASE 19   No results for input(s): AMMONIA in the last 168 hours. Coagulation Profile: No results for input(s): INR, PROTIME in the last 168 hours. Cardiac Enzymes: No results for input(s): CKTOTAL, CKMB, CKMBINDEX, TROPONINI in the last 168  hours. BNP (last 3 results) No results for input(s): PROBNP in the last 8760 hours. HbA1C: No results for input(s): HGBA1C in the last 72 hours. CBG: No results for input(s): GLUCAP in the last 168 hours. Lipid Profile: No results for input(s): CHOL, HDL, LDLCALC, TRIG, CHOLHDL, LDLDIRECT in the last 72 hours. Thyroid Function Tests: No results for input(s): TSH, T4TOTAL, FREET4, T3FREE, THYROIDAB in the last 72 hours. Anemia Panel: No results for input(s): VITAMINB12, FOLATE, FERRITIN, TIBC, IRON, RETICCTPCT in the last 72 hours. Urine analysis:    Component Value Date/Time   COLORURINE YELLOW 02/17/2017 [place] 02/17/2017 1212   LABSPEC 1.023 02/17/2017 1212   PHURINE 5.0 02/17/2017 1212   GLUCOSEU 50 (A) 02/17/2017 1212   HGBUR NEGATIVE 02/17/2017 1212   BILIRUBINUR NEGATIVE 02/17/2017 1212   KETONESUR 20 (A) 02/17/2017 1212   PROTEINUR >=300 (A) 02/17/2017 1212   UROBILINOGEN 1.0 12/02/2013 1612   NITRITE NEGATIVE 02/17/2017 1212   LEUKOCYTESUR TRACE (A) 02/17/2017 1212   Sepsis Labs: !!!!!!!!!!!!!!!!!!!!!!!!!!!!!!!!!!!!!!!!!!!! @LABRCNTIP (procalcitonin:4,lacticidven:4) )No results found for this or any previous visit (from the past 240 hour(s)).   Radiological Exams on Admission: Ct Abdomen Pelvis Wo Contrast  Result Date: 03/17/2017 CLINICAL DATA:  Abdominal pain. EXAM: CT ABDOMEN AND PELVIS WITHOUT CONTRAST TECHNIQUE: Multidetector CT imaging of the abdomen and pelvis was performed following the standard protocol without IV contrast. COMPARISON:  CT scan dated 02/17/2017 FINDINGS: Lower chest: No acute abnormality. Hepatobiliary: No focal liver abnormality is seen. Status post cholecystectomy. No biliary dilatation. Pancreas: Unremarkable. No pancreatic ductal dilatation or surrounding inflammatory changes. Spleen: Normal in size without focal abnormality. Adrenals/Urinary Tract: Left pelvic kidney in the midline. Bilateral renal cysts as demonstrated  previously. No hydronephrosis. Adrenal glands and bladder are normal. Stomach/Bowel: New thickening of the mucosa sigmoid portion of the colon with new adjacent new pericolonic soft tissue stranding. Small amount of free fluid in the pelvis. No discrete abscess or free air at this time. Vascular/Lymphatic: Aortic atherosclerosis. No enlarged abdominal or pelvic lymph nodes. Reproductive: Status post hysterectomy. No adnexal masses. Other: None Musculoskeletal: No acute or significant osseous findings. IMPRESSION: 1. New colitis of the sigmoid and rectum with new pericolonic soft tissue stranding and  a small amount of free fluid in the pelvis. 2. No evidence of perforation at this time. 3. Left pelvic kidney in the midline without acute abnormality. 4.  Aortic Atherosclerosis (ICD10-I70.0). Electronically Signed   By: Lorriane Shire M.D.   On: 03/17/2017 13:42    Old chart reviewed  Case discussed with EDP Dr. Lita Mains   Assessment/Plan 67 year old female with recent treatment for acute diverticuli is comes in with now C. difficile colitis Principal Problem:   C. difficile colitis-placed on IV Flagyl.  Patient unable to hold down oral Flagyl at this time.  Symptomatically treat with some Zofran.  IV fluids overnight.  Full liquid diet and see how she tolerates that.  Abdominal exam is benign.  Active Problems:   Nausea & vomiting-symptomatic support with Zofran   Diabetes mellitus without complication (HCC)-continue her Lantus and place on sliding scale insulin   Epilepsy (HCC)-continue Depakote   Chronic pain-continue home meds   Diverticulitis recently treated with a course of ciprofloxacin and Flagyl   DVT prophylaxis: SCDs Code Status: Full Family Communication: None Disposition Plan: Per day team Consults called: None Admission status: Observation   DAVID,RACHAL A MD [facility]  If 7PM-7AM, please contact night-coverage [email or web address] Password [facility]  03/17/2017, 3:21 PM

## 2017-03-17 NOTE — ED Notes (Signed)
Bedside commode with hat at bedside; along with female urinal to get urine sample.

## 2017-03-17 NOTE — ED Triage Notes (Signed)
Patient reports she has C diff, diverticulitis and vomiting since last week. Reports was put on Flagyl last Friday.  Reports that her PCP in [place] told her to go to ED since can't keep any of her medications down.

## 2017-03-17 NOTE — ED Provider Notes (Signed)
[facility] DEPT Provider Note   CSN: [id number] Arrival date & time: 03/17/17  1044     History   Chief Complaint Chief Complaint  Patient presents with  . Diarrhea  . Emesis    HPI Andrea [name] is a 67 y.o. female.  HPI Patient was admitted last month for diverticulitis.  She states she has had persistent left lower quadrant pain since discharge.  She has had ongoing diarrhea with mucus.  She was diagnosed with C difficile last week by her primary doctor and started on Flagyl.  She had increased nausea and multiple episodes of vomiting over the weekend.  States she was unable to tolerate her oral medication.  Denies blood in vomit or stool.  No fever or chills. Past Medical History:  Diagnosis Date  . Breast cancer ([place])    bilat mastectomy and LUE lymphadenectomy 2014, chemoRx 2014 - 15  . Diabetes mellitus without complication ([place])   . Esophageal reflux   . Hypertension   . Hypothyroid   . Migraine   . Seizure [facility])     Patient Active Problem List   Diagnosis Date Noted  . Diverticulitis 02/17/2017  . Frequent falls 01/08/2017  . Rhabdomyolysis 01/08/2017  . Chronic pain 01/08/2017  . Nausea & vomiting 11/23/2016  . Diarrhea 11/23/2016  . UTI (urinary tract infection) 12/03/2013  . Fall 12/02/2013  . Hypertension   . Breast cancer ([place])   . Diabetes mellitus without complication ([place])   . Migraine   . Hypothyroid   . Epilepsy ([place])   . Esophageal reflux   . Multiple rib fractures 11/28/2013    Past Surgical History:  Procedure Laterality Date  . ABDOMINAL HYSTERECTOMY     1984 , done for ruptured cyst and endometriosis  . APPENDECTOMY    . CHOLECYSTECTOMY     1980's  . EYE SURGERY  08/28/2015   Right eye  . KNEE SURGERY Left   . MASTECTOMY     bilateral  . Open surgery for bowel obstruction, 2000's      OB History    No data available       Home Medications    Prior to Admission medications     Medication Sig Start Date End Date Taking? Authorizing Provider  ALPRAZolam (XANAX) 0.5 MG tablet TAKE 1 TABLET BY MOUTH 4 TIMES A DAY AS NEEDED FOR ANXIETY 11/21/16   [provider]  atenolol (TENORMIN) 50 MG tablet Take 50 mg by mouth 2 (two) times daily.     [provider]  AZO-CRANBERRY PO Take 1 tablet by mouth daily.    [provider]  ciprofloxacin (CIPRO) 500 MG tablet Take 1 tablet (500 mg total) by mouth 2 (two) times daily. 02/24/17   Patrecia Pour, MD  divalproex (DEPAKOTE) 500 MG DR tablet Take 500 mg by mouth at bedtime.     [provider]  EMBEDA 20-0.8 MG CPCR Take 1 tablet by mouth daily. 10/24/16   [provider]  insulin glargine (LANTUS) 100 UNIT/ML injection Inject 30 Units into the skin daily.     [provider]  levothyroxine (SYNTHROID, LEVOTHROID) 200 MCG tablet Take 1 tablet (200 mcg total) by mouth at bedtime. 01/12/17   Lavina Hamman, MD  lisinopril (PRINIVIL,ZESTRIL) 10 MG tablet Take 10 mg by mouth at bedtime.     [provider]  metroNIDAZOLE (FLAGYL) 500 MG tablet Take 1 tablet (500 mg total) by mouth every 8 (eight) hours. 02/24/17  Patrecia Pour, MD  miconazole (MICOTIN) 100 MG vaginal suppository Place 100 mg vaginally as needed (Yeast infection).    [provider]  ondansetron (ZOFRAN) 4 MG tablet Take 1 tablet (4 mg total) by mouth every 6 (six) hours as needed for nausea or vomiting. 02/24/17   Patrecia Pour, MD  ondansetron (ZOFRAN) 4 MG/2ML SOLN injection Inject 2 mLs (4 mg total) every 6 (six) hours as needed into the vein for nausea or vomiting. 11/24/16   Benito Mccreedy, MD  oxyCODONE-acetaminophen (PERCOCET) 10-325 MG tablet Take 1 tablet by mouth 3 (three) times daily as needed for pain. 02/03/17   [provider]  pregabalin (LYRICA) 75 MG capsule Take 1 capsule (75 mg total) 2 (two) times daily by mouth. 11/24/16   Benito Mccreedy, MD  promethazine (PHENERGAN) 25 MG  suppository Place 1 suppository (25 mg total) rectally every 6 (six) hours as needed for nausea or vomiting. 11/30/15   Law, Bea Graff, PA-C  saccharomyces boulardii (FLORASTOR) 250 MG capsule Take 1 capsule (250 mg total) by mouth 2 (two) times daily. 02/24/17   Patrecia Pour, MD    Family History Family History  Problem Relation Age of Onset  . Sudden Cardiac Death Neg Hx     Social History Social History   Tobacco Use  . Smoking status: Never Smoker  . Smokeless tobacco: Never Used  Substance Use Topics  . Alcohol use: No  . Drug use: No     Allergies   Aspirin; Maxzide [hydrochlorothiazide w-triamterene]; Metformin and related; Nsaids; Other; Pravachol [pravastatin]; Stadol [butorphanol]; Toradol [ketorolac tromethamine]; Vistaril [hydroxyzine hcl]; Erythromycin; Morphine and related; and Penicillins   Review of Systems Review of Systems  Constitutional: Positive for fatigue. Negative for chills and fever.  HENT: Negative for sore throat and trouble swallowing.   Eyes: Negative for visual disturbance.  Respiratory: Negative for cough and shortness of breath.   Cardiovascular: Negative for chest pain.  Gastrointestinal: Positive for abdominal pain, diarrhea, nausea and vomiting. Negative for blood in stool.  Genitourinary: Negative for dysuria, flank pain and frequency.  Musculoskeletal: Negative for back pain, myalgias, neck pain and neck stiffness.  Skin: Negative for rash and wound.  Neurological: Negative for dizziness, weakness, light-headedness, numbness and headaches.  All other systems reviewed and are negative.    Physical Exam Updated Vital Signs BP 139/68   Pulse 90   Temp 97.9 F (36.6 C) (Oral)   Resp 18   SpO2 97%   Physical Exam  Constitutional: She is oriented to person, place, and time. She appears well-developed and well-nourished. No distress.  HENT:  Head: Normocephalic and atraumatic.  Mouth/Throat: Oropharynx is clear and moist.  Eyes:  EOM are normal. Pupils are equal, round, and reactive to light.  Neck: Normal range of motion. Neck supple.  Cardiovascular: Normal rate and regular rhythm. Exam reveals no gallop and no friction rub.  No murmur heard. Pulmonary/Chest: Effort normal and breath sounds normal.  Abdominal: Soft. Bowel sounds are normal. There is tenderness. There is no rebound and no guarding.  Diffuse abdominal tenderness which appears to be most pronounced in the left lower quadrant.  No rebound or guarding.  Musculoskeletal: Normal range of motion. She exhibits no edema or tenderness.  Neurological: She is alert and oriented to person, place, and time.  Moves all extremities without focal deficit.  Sensation intact.  Skin: Skin is warm and dry. Capillary refill takes less than 2 seconds. No rash noted. She is not diaphoretic.  No erythema.  Psychiatric: She has a normal mood and affect. Her behavior is normal.  Nursing note and vitals reviewed.    ED Treatments / Results  Labs (all labs ordered are listed, but only abnormal results are displayed) Labs Reviewed  COMPREHENSIVE METABOLIC PANEL - Abnormal; Notable for the following components:      Result Value   Chloride 96 (*)    Glucose, Bld 283 (*)    BUN 51 (*)    Creatinine, Ser 1.26 (*)    Albumin 3.2 (*)    GFR calc non Af Amer 44 (*)    GFR calc Af Amer 51 (*)    Anion gap 16 (*)    All other components within normal limits  CBC - Abnormal; Notable for the following components:   WBC 16.5 (*)    Platelets 402 (*)    All other components within normal limits  LIPASE, BLOOD  MAGNESIUM  URINALYSIS, ROUTINE W REFLEX MICROSCOPIC    EKG  EKG Interpretation None       Radiology Ct Abdomen Pelvis Wo Contrast  Result Date: 03/17/2017 CLINICAL DATA:  Abdominal pain. EXAM: CT ABDOMEN AND PELVIS WITHOUT CONTRAST TECHNIQUE: Multidetector CT imaging of the abdomen and pelvis was performed following the standard protocol without IV contrast.  COMPARISON:  CT scan dated 02/17/2017 FINDINGS: Lower chest: No acute abnormality. Hepatobiliary: No focal liver abnormality is seen. Status post cholecystectomy. No biliary dilatation. Pancreas: Unremarkable. No pancreatic ductal dilatation or surrounding inflammatory changes. Spleen: Normal in size without focal abnormality. Adrenals/Urinary Tract: Left pelvic kidney in the midline. Bilateral renal cysts as demonstrated previously. No hydronephrosis. Adrenal glands and bladder are normal. Stomach/Bowel: New thickening of the mucosa sigmoid portion of the colon with new adjacent new pericolonic soft tissue stranding. Small amount of free fluid in the pelvis. No discrete abscess or free air at this time. Vascular/Lymphatic: Aortic atherosclerosis. No enlarged abdominal or pelvic lymph nodes. Reproductive: Status post hysterectomy. No adnexal masses. Other: None Musculoskeletal: No acute or significant osseous findings. IMPRESSION: 1. New colitis of the sigmoid and rectum with new pericolonic soft tissue stranding and a small amount of free fluid in the pelvis. 2. No evidence of perforation at this time. 3. Left pelvic kidney in the midline without acute abnormality. 4.  Aortic Atherosclerosis (ICD10-I70.0). Electronically Signed   By: Lorriane Shire M.D.   On: 03/17/2017 13:42    Procedures Procedures (including critical care time)  Medications Ordered in ED Medications  ondansetron (ZOFRAN) injection 4 mg (not administered)  iopamidol (ISOVUE-300) 61 % injection (75 mLs  Canceled Entry 03/17/17 1258)  sodium chloride 0.9 % injection (not administered)  sodium chloride 0.9 % bolus 1,000 mL (1,000 mLs Intravenous New Bag/Given 03/17/17 1250)  fentaNYL (SUBLIMAZE) injection 50 mcg (50 mcg Intravenous Given 03/17/17 1251)     Initial Impression / Assessment and Plan / ED Course  I have reviewed the triage vital signs and the nursing notes.  Pertinent labs & imaging results that were available during my  care of the patient were reviewed by me and considered in my medical decision making (see chart for details).     CT with evidence of colitis.  Patient does have a bump in her creatinine over baseline.  Likely due to dehydration.  Start IV fluids.  Discussed with hospitalist will see patient in the emergency department and admit.  Final Clinical Impressions(s) / ED Diagnoses   Final diagnoses:  Colitis  AKI (acute kidney injury) ([place])  Lower  abdominal pain    ED Discharge Orders    None       Julianne Rice, MD 03/17/17 1443

## 2017-03-17 NOTE — Care Management Note (Signed)
Case Management Note  Patient Details  Name: Andrea [name] MRN: [id number] Date of Birth: Feb 12, 1950  CM noted pt was active with [facility] RN PT.  Confirmed services with [facility].  Expected Discharge Date:   Unknown               Expected Discharge Plan:  [place] Choice:  Home Health Choice offered to:  Patient  HH Arranged:  RN, PT [facility] Agency:  [facility]  Status of Service:  In process, will continue to follow  Rae Mar, RN 03/17/2017, 12:46 PM

## 2017-03-17 NOTE — ED Notes (Signed)
Dr. Lita Mains aware of patient not having an IV at this time.

## 2017-03-17 NOTE — ED Notes (Signed)
Patient transported to CT 

## 2017-03-17 NOTE — ED Notes (Signed)
Attempted IV/labs x2, another RN to try [place]. R arm only

## 2017-03-17 NOTE — ED Notes (Signed)
ED TO INPATIENT HANDOFF REPORT  Name/Age/Gender Andrea [name] 66 y.o. female  Code Status    Code Status Orders  (From admission, onward)        Start     Ordered   03/17/17 1520  Full code  Continuous     03/17/17 1520    Code Status History    Date Active Date Inactive Code Status Order ID Comments User Context   02/17/2017 19:27 02/24/2017 17:47 Full Code [id number]  Elwin Mocha, MD ED   01/08/2017 03:54 01/12/2017 18:33 Full Code [id number]  Vianne Bulls, MD ED   11/23/2016 05:27 11/24/2016 23:27 Full Code [id number]  Jani Gravel, MD Inpatient   11/28/2013 18:28 12/06/2013 20:58 Full Code [id number]  Coralie Keens, MD Inpatient    Advance Directive Documentation     Most Recent Value  Type of Advance Directive  Healthcare Power of Attorney, Living will  Pre-existing out of facility DNR order (yellow form or pink MOST form)  No data  "MOST" Form in Place?  No data      Home/SNF/Other Home  Chief Complaint multiple complaints, fever  Level of Care/Admitting Diagnosis ED Disposition    ED Disposition Condition Comment   Admit  Hospital Area: [facility] [[id number]]  Level of Care: Med-Surg [16]  Diagnosis: C. difficile colitis [465681]  Admitting Physician: Phillips Grout [[id number]]  Attending Physician: Derrill Kay A [[id number]]  PT Class (Do Not Modify): Observation [104]  PT Acc Code (Do Not Modify): Observation [10022]       Medical History Past Medical History:  Diagnosis Date  . Breast cancer ([place])    bilat mastectomy and LUE lymphadenectomy 2014, chemoRx 2014 - 15  . Diabetes mellitus without complication ([place])   . Esophageal reflux   . Hypertension   . Hypothyroid   . Migraine   . Seizure ([place])     Allergies Allergies  Allergen Reactions  . Aspirin Nausea And Vomiting  . Maxzide [Hydrochlorothiazide W-Triamterene] Swelling    Fluid retention  . Metformin And Related Diarrhea  . Nsaids Nausea And Vomiting  . Other  Other (See Comments)    All steroids produce psychosis per pt Artificial sweeteners produce nausea and upset stomach.  Gregary Cromer [Pravastatin] Other (See Comments)    unknown  . Stadol [Butorphanol] Other (See Comments)    Toradol, etc And related- hallucinations  . Toradol [Ketorolac Tromethamine] Other (See Comments)    hallucinations  . Vistaril [Hydroxyzine Hcl] Other (See Comments)    unknown  . Erythromycin Itching and Rash  . Morphine And Related Hives and Rash    Iv morphine.  . Penicillins Hives, Itching and Rash    Has patient had a PCN reaction causing immediate rash, facial/tongue/throat swelling, SOB or lightheadedness with hypotension: yes Has patient had a PCN reaction causing severe rash involving mucus membranes or skin necrosis: unknown Has patient had a PCN reaction that required hospitalization : unknown Has patient had a PCN reaction occurring within the last 10 years: no If all of the above answers are "NO", then may proceed with Cephalosporin use.     IV Location/Drains/Wounds Patient Lines/Drains/Airways Status   Active Line/Drains/Airways    Name:   Placement date:   Placement time:   Site:   Days:   Peripheral IV 03/17/17 Right;Anterior;Lateral Forearm   03/17/17    1834    Forearm   less than 1          Labs/Imaging Results for orders  placed or performed during the hospital encounter of 03/17/17 (from the past 48 hour(s))  Lipase, blood     Status: None   Collection Time: 03/17/17 11:57 AM  Result Value Ref Range   Lipase 19 11 - 51 U/L    Comment: Performed at [facility], [place] [address] [place], [place] [address]  Comprehensive metabolic panel     Status: Abnormal   Collection Time: 03/17/17 11:57 AM  Result Value Ref Range   Sodium 138 135 - 145 mmol/L   Potassium 3.7 3.5 - 5.1 mmol/L   Chloride 96 (L) 101 - 111 mmol/L   CO2 26 22 - 32 mmol/L   Glucose, Bld 283 (H) 65 - 99 mg/dL   BUN 51 (H) 6 - 20 mg/dL    Creatinine, Ser 1.26 (H) 0.44 - 1.00 mg/dL   Calcium 9.3 8.9 - 10.3 mg/dL   Total Protein 7.9 6.5 - 8.1 g/dL   Albumin 3.2 (L) 3.5 - 5.0 g/dL   AST 18 15 - 41 U/L   ALT 17 14 - 54 U/L   Alkaline Phosphatase 72 38 - 126 U/L   Total Bilirubin 0.8 0.3 - 1.2 mg/dL   GFR calc non Af Amer 44 (L) >60 mL/min   GFR calc Af Amer 51 (L) >60 mL/min    Comment: (NOTE) The eGFR has been calculated using the CKD EPI equation. This calculation has not been validated in all clinical situations. eGFR's persistently <60 mL/min signify possible Chronic Kidney Disease.    Anion gap 16 (H) 5 - 15    Comment: Performed at [facility], [place] [address], [place], [place] [address]  CBC     Status: Abnormal   Collection Time: 03/17/17 11:57 AM  Result Value Ref Range   WBC 16.5 (H) 4.0 - 10.5 K/uL   RBC 4.63 3.87 - 5.11 MIL/uL   Hemoglobin 14.0 12.0 - 15.0 g/dL   HCT 40.5 36.0 - 46.0 %   MCV 87.5 78.0 - 100.0 fL   MCH 30.2 26.0 - 34.0 pg   MCHC 34.6 30.0 - 36.0 g/dL   RDW 13.3 11.5 - 15.5 %   Platelets 402 (H) 150 - 400 K/uL    Comment: Performed at [facility], [place] [address], [place], [place] [address]  Magnesium     Status: None   Collection Time: 03/17/17 11:57 AM  Result Value Ref Range   Magnesium 1.9 1.7 - 2.4 mg/dL    Comment: Performed at [facility], [place] [address] [place], [place] [address]  CBG monitoring, ED     Status: Abnormal   Collection Time: 03/17/17  5:17 PM  Result Value Ref Range   Glucose-Capillary 224 (H) 65 - 99 mg/dL  CBG monitoring, ED     Status: Abnormal   Collection Time: 03/17/17  8:39 PM  Result Value Ref Range   Glucose-Capillary 144 (H) 65 - 99 mg/dL   Ct Abdomen Pelvis Wo Contrast  Result Date: 03/17/2017 CLINICAL DATA:  Abdominal pain. EXAM: CT ABDOMEN AND PELVIS WITHOUT CONTRAST TECHNIQUE: Multidetector CT imaging of the abdomen and pelvis was performed following the standard protocol without IV  contrast. COMPARISON:  CT scan dated 02/17/2017 FINDINGS: Lower chest: No acute abnormality. Hepatobiliary: No focal liver abnormality is seen. Status post cholecystectomy. No biliary dilatation. Pancreas: Unremarkable. No pancreatic ductal dilatation or surrounding inflammatory changes. Spleen: Normal in size without focal abnormality. Adrenals/Urinary Tract: Left pelvic kidney in the midline. Bilateral  renal cysts as demonstrated previously. No hydronephrosis. Adrenal glands and bladder are normal. Stomach/Bowel: New thickening of the mucosa sigmoid portion of the colon with new adjacent new pericolonic soft tissue stranding. Small amount of free fluid in the pelvis. No discrete abscess or free air at this time. Vascular/Lymphatic: Aortic atherosclerosis. No enlarged abdominal or pelvic lymph nodes. Reproductive: Status post hysterectomy. No adnexal masses. Other: None Musculoskeletal: No acute or significant osseous findings. IMPRESSION: 1. New colitis of the sigmoid and rectum with new pericolonic soft tissue stranding and a small amount of free fluid in the pelvis. 2. No evidence of perforation at this time. 3. Left pelvic kidney in the midline without acute abnormality. 4.  Aortic Atherosclerosis (ICD10-I70.0). Electronically Signed   By: Lorriane Shire M.D.   On: 03/17/2017 13:42    Pending Labs Unresulted Labs (From admission, onward)   Start     Ordered   03/18/17 8101  Basic metabolic panel  Tomorrow morning,   R     03/17/17 1520   03/18/17 0500  CBC  Tomorrow morning,   R     03/17/17 1520      Vitals/Pain Today's Vitals   03/17/17 2000 03/17/17 2035 03/17/17 2134 03/17/17 2220  BP: 138/74  (!) 168/86   Pulse: 91  98   Resp: 20  14   Temp:      TempSrc:      SpO2: 95%  98%   PainSc:  10-Worst pain ever  7     Isolation Precautions No active isolations  Medications Medications  iopamidol (ISOVUE-300) 61 % injection (75 mLs  Canceled Entry 03/17/17 1258)  sodium chloride 0.9 %  injection (not administered)  atenolol (TENORMIN) tablet 50 mg (50 mg Oral Not Given 03/17/17 1704)  divalproex (DEPAKOTE) DR tablet 500 mg (not administered)  insulin glargine (LANTUS) injection 30 Units (30 Units Subcutaneous Given 03/17/17 2040)  levothyroxine (SYNTHROID, LEVOTHROID) tablet 200 mcg (not administered)  lisinopril (PRINIVIL,ZESTRIL) tablet 10 mg (not administered)  pregabalin (LYRICA) capsule 75 mg (75 mg Oral Not Given 03/17/17 1705)  saccharomyces boulardii (FLORASTOR) capsule 250 mg (not administered)  sodium chloride flush (NS) 0.9 % injection 3 mL (3 mLs Intravenous Not Given 03/17/17 1706)  sodium chloride flush (NS) 0.9 % injection 3 mL (not administered)  0.9 %  sodium chloride infusion (not administered)  acetaminophen (TYLENOL) tablet 650 mg (not administered)    Or  acetaminophen (TYLENOL) suppository 650 mg (not administered)  ondansetron (ZOFRAN) tablet 4 mg (not administered)    Or  ondansetron (ZOFRAN) injection 4 mg (not administered)  insulin aspart (novoLOG) injection 0-9 Units (3 Units Subcutaneous Given 03/17/17 1816)  metroNIDAZOLE (FLAGYL) IVPB 500 mg (0 mg Intravenous Stopped 03/17/17 1942)  oxyCODONE-acetaminophen (PERCOCET/ROXICET) 5-325 MG per tablet 1 tablet (not administered)    And  oxyCODONE (Oxy IR/ROXICODONE) immediate release tablet 5 mg (5 mg Oral Given 03/17/17 2041)  sodium chloride 0.9 % bolus 1,000 mL (1,000 mLs Intravenous New Bag/Given 03/17/17 1250)  fentaNYL (SUBLIMAZE) injection 50 mcg (50 mcg Intravenous Given 03/17/17 1251)    Mobility walks

## 2017-03-18 DIAGNOSIS — G40909 Epilepsy, unspecified, not intractable, without status epilepticus: Secondary | ICD-10-CM | POA: Diagnosis present

## 2017-03-18 DIAGNOSIS — R159 Full incontinence of feces: Secondary | ICD-10-CM | POA: Diagnosis present

## 2017-03-18 DIAGNOSIS — G8929 Other chronic pain: Secondary | ICD-10-CM | POA: Diagnosis present

## 2017-03-18 DIAGNOSIS — K219 Gastro-esophageal reflux disease without esophagitis: Secondary | ICD-10-CM | POA: Diagnosis present

## 2017-03-18 DIAGNOSIS — A0472 Enterocolitis due to Clostridium difficile, not specified as recurrent: Secondary | ICD-10-CM | POA: Diagnosis not present

## 2017-03-18 DIAGNOSIS — F419 Anxiety disorder, unspecified: Secondary | ICD-10-CM | POA: Diagnosis present

## 2017-03-18 DIAGNOSIS — Z79890 Hormone replacement therapy (postmenopausal): Secondary | ICD-10-CM | POA: Diagnosis not present

## 2017-03-18 DIAGNOSIS — E119 Type 2 diabetes mellitus without complications: Secondary | ICD-10-CM | POA: Diagnosis present

## 2017-03-18 DIAGNOSIS — N179 Acute kidney failure, unspecified: Secondary | ICD-10-CM | POA: Diagnosis present

## 2017-03-18 DIAGNOSIS — E869 Volume depletion, unspecified: Secondary | ICD-10-CM | POA: Diagnosis present

## 2017-03-18 DIAGNOSIS — Z90710 Acquired absence of both cervix and uterus: Secondary | ICD-10-CM | POA: Diagnosis not present

## 2017-03-18 DIAGNOSIS — Z794 Long term (current) use of insulin: Secondary | ICD-10-CM | POA: Diagnosis not present

## 2017-03-18 DIAGNOSIS — Z9013 Acquired absence of bilateral breasts and nipples: Secondary | ICD-10-CM | POA: Diagnosis not present

## 2017-03-18 DIAGNOSIS — R112 Nausea with vomiting, unspecified: Secondary | ICD-10-CM | POA: Diagnosis present

## 2017-03-18 DIAGNOSIS — I10 Essential (primary) hypertension: Secondary | ICD-10-CM | POA: Diagnosis present

## 2017-03-18 DIAGNOSIS — Z853 Personal history of malignant neoplasm of breast: Secondary | ICD-10-CM | POA: Diagnosis not present

## 2017-03-18 DIAGNOSIS — E876 Hypokalemia: Secondary | ICD-10-CM | POA: Diagnosis present

## 2017-03-18 DIAGNOSIS — E039 Hypothyroidism, unspecified: Secondary | ICD-10-CM | POA: Diagnosis present

## 2017-03-18 LAB — CBC
HEMATOCRIT: 34.6 % — AB (ref 36.0–46.0)
HEMOGLOBIN: 11.9 g/dL — AB (ref 12.0–15.0)
MCH: 29.9 pg (ref 26.0–34.0)
MCHC: 34.4 g/dL (ref 30.0–36.0)
MCV: 86.9 fL (ref 78.0–100.0)
Platelets: 331 10*3/uL (ref 150–400)
RBC: 3.98 MIL/uL (ref 3.87–5.11)
RDW: 13.3 % (ref 11.5–15.5)
WBC: 12 10*3/uL — AB (ref 4.0–10.5)

## 2017-03-18 LAB — BASIC METABOLIC PANEL
ANION GAP: 9 (ref 5–15)
BUN: 44 mg/dL — ABNORMAL HIGH (ref 6–20)
CHLORIDE: 104 mmol/L (ref 101–111)
CO2: 25 mmol/L (ref 22–32)
Calcium: 8.3 mg/dL — ABNORMAL LOW (ref 8.9–10.3)
Creatinine, Ser: 0.89 mg/dL (ref 0.44–1.00)
GFR calc Af Amer: >60 mL/min (ref >60)
GLUCOSE: 168 mg/dL — AB (ref 65–99)
POTASSIUM: 3.2 mmol/L — AB (ref 3.5–5.1)
Sodium: 138 mmol/L (ref 135–145)

## 2017-03-18 LAB — GLUCOSE, CAPILLARY
GLUCOSE-CAPILLARY: 96 mg/dL (ref 65–99)
Glucose-Capillary: 136 mg/dL — ABNORMAL HIGH (ref 65–99)
Glucose-Capillary: 182 mg/dL — ABNORMAL HIGH (ref 65–99)
Glucose-Capillary: 201 mg/dL — ABNORMAL HIGH (ref 65–99)

## 2017-03-18 MED ORDER — MORPHINE SULFATE ER 15 MG PO TBCR
15.0000 mg | EXTENDED_RELEASE_TABLET | Freq: Every day | ORAL | Status: DC
  Administered 2017-03-18 – 2017-03-20 (×3): 15 mg via ORAL
  Filled 2017-03-18 (×3): qty 1

## 2017-03-18 MED ORDER — POTASSIUM CHLORIDE 10 MEQ/100ML IV SOLN
10.0000 meq | INTRAVENOUS | Status: AC
  Administered 2017-03-18 (×4): 10 meq via INTRAVENOUS
  Filled 2017-03-18 (×4): qty 100

## 2017-03-18 MED ORDER — MORPHINE-NALTREXONE 20-0.8 MG PO CPCR
1.0000 | ORAL_CAPSULE | Freq: Every day | ORAL | Status: DC
Start: 2017-03-18 — End: 2017-03-18

## 2017-03-18 MED ORDER — ALPRAZOLAM 0.5 MG PO TABS: 0.5000 mg | ORAL_TABLET | Freq: Four times a day (QID) | ORAL | Status: DC | PRN

## 2017-03-18 MED ORDER — VANCOMYCIN 50 MG/ML ORAL SOLUTION
125.0000 mg | Freq: Four times a day (QID) | ORAL | Status: DC
  Administered 2017-03-18 – 2017-03-20 (×11): 125 mg via ORAL
  Filled 2017-03-18 (×12): qty 2.5

## 2017-03-18 NOTE — Progress Notes (Signed)
Initial Nutrition Assessment  INTERVENTION:   Will monitor for nutritional needs  NUTRITION DIAGNOSIS:   Inadequate oral intake related to nausea, vomiting as evidenced by per patient/family report.  GOAL:   Patient will meet greater than or equal to 90% of their needs  MONITOR:   PO intake, Supplement acceptance, I & O's, Labs, Weight trends  REASON FOR ASSESSMENT:   Malnutrition Screening Tool    ASSESSMENT:   67 yo female with past medical history significant of diabetes, hypertension, recent diagnosis and hospitalization for diverticulitis the beginning of this month was discharged on Cipro and Flagyl.    Pt consuming ~50% of full liquid trays. Diet was just advanced to soft. Nausea controlled at this time.  Per chart review, pt has gained weight since last admission in January 2018.  Will continue to monitor for needs.  Medications reviewed. Labs reviewed: Low K CBGs: 883-254   NUTRITION - FOCUSED PHYSICAL EXAM:  Nutrition focused physical exam shows no sign of depletion of muscle mass or body fat.  Diet Order:  DIET SOFT Room service appropriate? Yes; Fluid consistency: Thin  EDUCATION NEEDS:   No education needs have been identified at this time  Skin:  Skin Assessment: Reviewed RN Assessment  Last BM:   PTA  Height:   Ht Readings from Last 1 Encounters:  03/17/17 5\' 5"  (1.651 m)    Weight:   Wt Readings from Last 1 Encounters:  03/18/17 147 lb 14.9 oz (67.1 kg)    Ideal Body Weight:  56.8 kg  BMI:  Body mass index is 24.62 kg/m.  Estimated Nutritional Needs:   Kcal:  1500-1700  Protein:  70-80g  Fluid:  1.7L/day  Clayton Bibles, MS, RD, LDN [place] [facility] Pager: [phone] After Hours Pager: [phone]

## 2017-03-18 NOTE — Progress Notes (Addendum)
PROGRESS NOTE    Andrea [name]  DTO:[id number] DOB: 1950-09-12 DOA: 03/17/2017 PCP: Medicine, [facility]     Brief Narrative:  Andrea [name] is a 67 yo female with past medical history significant of diabetes, hypertension, recent diagnosis and hospitalization for diverticulitis the beginning of this month was discharged on Cipro and Flagyl.  She had a follow-up with her primary care physician last week.  During that time she was still having some mild diarrhea which she told her primary care doctor.  He did some stool studies and her stool studies came back with C. difficile on Friday 03/14/17 afternoon. She started to take Flagyl again on Friday but through the weekend she started to get worse with a lot more diarrhea associated with nausea and vomiting.  She cannot hold down her Flagyl every time she tries to take that she throws it back up.  She started running fever last night up to over 101.  Patient is gotten to the point where she has to wear depends because she is becoming incontinent of her stool.  Patient was admitted to [facility] for C Diff treatment as well as intractable nausea and vomiting.   Assessment & Plan:   Principal Problem:   C. difficile colitis Active Problems:   Diabetes mellitus without complication ([facility])   Epilepsy ([place])   Nausea & vomiting   Chronic pain   Diverticulitis   C Diff colitis -CT abd/pelvis: New colitis of the sigmoid and rectum with new pericolonic soft tissue stranding and a small amount of free fluid in the pelvis. -Was prescribed oral flagyl by PCP and patient was unable to tolerate it due to nausea/vomiting. Placed on IV flagyl on admission. Today, patient was able to tolerate full liquid diet and no further nausea/vomiting. Will switch to PO vanco.  -WBC improving. Afebrile.   Nausea/vomiting -Improved and tolerated small amount of full liquid diet. Will advance to soft diet and see how she tolerated PO  antibiotics  -Continue antiemetics prn  AKI -Secondary to volume depletion -Resolved with IVF   DM  -Lantus, SSI  Epilepsy -Continue depakote  HTN -Continue tenormin, lisinopril   Hypothyroidism -Continue levothyroxine   Chronic pain -Continue home meds   Hypokalemia -Replace, trend   Anxiety -Continue xanax    DVT prophylaxis: SCD Code Status: Full Family Communication: No family at bedside Disposition Plan: Pending improvement   Consultants:   None  Procedures:   None   Antimicrobials:  Anti-infectives (From admission, onward)   Start     Dose/Rate Route Frequency Ordered Stop   03/18/17 1100  vancomycin (VANCOCIN) 50 mg/mL oral solution 125 mg     125 mg Oral 4 times daily 03/18/17 0949 03/28/17 0959   03/17/17 1600  metroNIDAZOLE (FLAGYL) IVPB 500 mg  Status:  Discontinued     500 mg 100 mL/hr over 60 Minutes Intravenous Every 8 hours 03/17/17 1520 03/18/17 0949       Subjective: Patient had > 10 BM daily at home over the weekend. Slowing down but still had about 9 BM yesterday. It is mucus-like. No further nausea, vomiting. Complains of LLQ abdominal pain that is crampy in nature.   Objective: Vitals:   03/17/17 2000 03/17/17 2134 03/17/17 2319 03/18/17 0429  BP: 138/74 (!) 168/86 (!) 175/79 (!) 168/81  Pulse: 91 98 92 76  Resp: 20 14 12 14   Temp:   99 F (37.2 C) 98.1 F (36.7 C)  TempSrc:   Oral Oral  SpO2: 95% 98% 99% 100%  Weight:   66.6 kg (146 lb 13.2 oz) 67.1 kg (147 lb 14.9 oz)  Height:   5\' 5"  (1.651 m)     Intake/Output Summary (Last 24 hours) at 03/18/2017 1206 Last data filed at 03/18/2017 0919 Gross per 24 hour  Intake 340 ml  Output -  Net 340 ml   Filed Weights   03/17/17 2319 03/18/17 0429  Weight: 66.6 kg (146 lb 13.2 oz) 67.1 kg (147 lb 14.9 oz)    Examination:  General exam: Appears calm and comfortable  Respiratory system: Clear to auscultation. Respiratory effort normal. Cardiovascular system: S1 & S2  heard, RRR. No JVD, murmurs, rubs, gallops or clicks. No pedal edema. Gastrointestinal system: Abdomen is nondistended, soft and TTP LLQ. No organomegaly or masses felt. Normal bowel sounds heard. Central nervous system: Alert and oriented. No focal neurological deficits. Extremities: Symmetric 5 x 5 power. Skin: No rashes, lesions or ulcers Psychiatry: Judgement and insight appear normal. Mood & affect appropriate.   Data Reviewed: I have personally reviewed following labs and imaging studies  CBC: Recent Labs  Lab 03/17/17 1157 03/18/17 0409  WBC 16.5* 12.0*  HGB 14.0 11.9*  HCT 40.5 34.6*  MCV 87.5 86.9  PLT 402* 676   Basic Metabolic Panel: Recent Labs  Lab 03/17/17 1157 03/18/17 0409  NA 138 138  K 3.7 3.2*  CL 96* 104  CO2 26 25  GLUCOSE 283* 168*  BUN 51* 44*  CREATININE 1.26* 0.89  CALCIUM 9.3 8.3*  MG 1.9  --    GFR: Estimated Creatinine Clearance: 56.7 mL/min (by C-G formula based on SCr of 0.89 mg/dL). Liver Function Tests: Recent Labs  Lab 03/17/17 1157  AST 18  ALT 17  ALKPHOS 72  BILITOT 0.8  PROT 7.9  ALBUMIN 3.2*   Recent Labs  Lab 03/17/17 1157  LIPASE 19   No results for input(s): AMMONIA in the last 168 hours. Coagulation Profile: No results for input(s): INR, PROTIME in the last 168 hours. Cardiac Enzymes: No results for input(s): CKTOTAL, CKMB, CKMBINDEX, TROPONINI in the last 168 hours. BNP (last 3 results) No results for input(s): PROBNP in the last 8760 hours. HbA1C: No results for input(s): HGBA1C in the last 72 hours. CBG: Recent Labs  Lab 03/17/17 1717 03/17/17 2039 03/18/17 0736 03/18/17 1157  GLUCAP 224* 144* 136* 182*   Lipid Profile: No results for input(s): CHOL, HDL, LDLCALC, TRIG, CHOLHDL, LDLDIRECT in the last 72 hours. Thyroid Function Tests: No results for input(s): TSH, T4TOTAL, FREET4, T3FREE, THYROIDAB in the last 72 hours. Anemia Panel: No results for input(s): VITAMINB12, FOLATE, FERRITIN, TIBC,  IRON, RETICCTPCT in the last 72 hours. Sepsis Labs: No results for input(s): PROCALCITON, LATICACIDVEN in the last 168 hours.  No results found for this or any previous visit (from the past 240 hour(s)).     Radiology Studies: Ct Abdomen Pelvis Wo Contrast  Result Date: 03/17/2017 CLINICAL DATA:  Abdominal pain. EXAM: CT ABDOMEN AND PELVIS WITHOUT CONTRAST TECHNIQUE: Multidetector CT imaging of the abdomen and pelvis was performed following the standard protocol without IV contrast. COMPARISON:  CT scan dated 02/17/2017 FINDINGS: Lower chest: No acute abnormality. Hepatobiliary: No focal liver abnormality is seen. Status post cholecystectomy. No biliary dilatation. Pancreas: Unremarkable. No pancreatic ductal dilatation or surrounding inflammatory changes. Spleen: Normal in size without focal abnormality. Adrenals/Urinary Tract: Left pelvic kidney in the midline. Bilateral renal cysts as demonstrated previously. No hydronephrosis. Adrenal glands and bladder are normal. Stomach/Bowel: New  thickening of the mucosa sigmoid portion of the colon with new adjacent new pericolonic soft tissue stranding. Small amount of free fluid in the pelvis. No discrete abscess or free air at this time. Vascular/Lymphatic: Aortic atherosclerosis. No enlarged abdominal or pelvic lymph nodes. Reproductive: Status post hysterectomy. No adnexal masses. Other: None Musculoskeletal: No acute or significant osseous findings. IMPRESSION: 1. New colitis of the sigmoid and rectum with new pericolonic soft tissue stranding and a small amount of free fluid in the pelvis. 2. No evidence of perforation at this time. 3. Left pelvic kidney in the midline without acute abnormality. 4.  Aortic Atherosclerosis (ICD10-I70.0). Electronically Signed   By: Lorriane Shire M.D.   On: 03/17/2017 13:42      Scheduled Meds: . atenolol  50 mg Oral BID  . divalproex  500 mg Oral QHS  . insulin aspart  0-9 Units Subcutaneous TID WC  . insulin  glargine  30 Units Subcutaneous Daily  . levothyroxine  200 mcg Oral QHS  . lisinopril  10 mg Oral QHS  . Morphine-Naltrexone  1 tablet Oral Daily  . pregabalin  75 mg Oral BID  . saccharomyces boulardii  250 mg Oral BID  . sodium chloride flush  3 mL Intravenous Q12H  . vancomycin  125 mg Oral QID   Continuous Infusions: . sodium chloride    . potassium chloride Stopped (03/18/17 1043)     LOS: 0 days    Time spent: 40 minutes   Dessa Phi, DO [facility] [email or web address] Password [facility] 03/18/2017, 12:06 PM

## 2017-03-19 DIAGNOSIS — A0472 Enterocolitis due to Clostridium difficile, not specified as recurrent: Principal | ICD-10-CM

## 2017-03-19 LAB — CBC
HCT: 31.2 % — ABNORMAL LOW (ref 36.0–46.0)
HEMOGLOBIN: 10.8 g/dL — AB (ref 12.0–15.0)
MCH: 30.4 pg (ref 26.0–34.0)
MCHC: 34.6 g/dL (ref 30.0–36.0)
MCV: 87.9 fL (ref 78.0–100.0)
Platelets: 283 10*3/uL (ref 150–400)
RBC: 3.55 MIL/uL — ABNORMAL LOW (ref 3.87–5.11)
RDW: 13.4 % (ref 11.5–15.5)
WBC: 10.3 10*3/uL (ref 4.0–10.5)

## 2017-03-19 LAB — BASIC METABOLIC PANEL
ANION GAP: 8 (ref 5–15)
BUN: 27 mg/dL — ABNORMAL HIGH (ref 6–20)
CALCIUM: 8.1 mg/dL — AB (ref 8.9–10.3)
CHLORIDE: 104 mmol/L (ref 101–111)
CO2: 24 mmol/L (ref 22–32)
Creatinine, Ser: 0.68 mg/dL (ref 0.44–1.00)
GFR calc non Af Amer: >60 mL/min (ref >60)
Glucose, Bld: 146 mg/dL — ABNORMAL HIGH (ref 65–99)
Potassium: 3.5 mmol/L (ref 3.5–5.1)
SODIUM: 136 mmol/L (ref 135–145)

## 2017-03-19 LAB — GLUCOSE, CAPILLARY
GLUCOSE-CAPILLARY: 141 mg/dL — AB (ref 65–99)
GLUCOSE-CAPILLARY: 202 mg/dL — AB (ref 65–99)
Glucose-Capillary: 178 mg/dL — ABNORMAL HIGH (ref 65–99)
Glucose-Capillary: 151 mg/dL — ABNORMAL HIGH (ref 65–99)

## 2017-03-19 NOTE — Progress Notes (Signed)
PROGRESS NOTE    Andrea [name]  ION:[id number] DOB: 1951-01-17 DOA: 03/17/2017 PCP: Medicine, [facility]   Brief Narrative:  Andrea [name] is a 67 yo female with past medical history significant ofdiabetes, hypertension, recent diagnosis and hospitalization for diverticulitis the beginning of this month was discharged on Cipro and Flagyl. She had a follow-up with her primary care physician last week. During that time she was still having some mild diarrhea which she told her primary care doctor. He did some stool studies and her stool studies came back with C. difficile on Friday 03/14/17 afternoon. She started to take Flagyl again on Friday but through the weekend she started to get worse with a lot more diarrhea associated with nausea and vomiting. She cannot hold down her Flagyl every time she tries to take that she throws it back up. She started running fever last night up to over 101. Patient is gotten to the point where she has to wear depends because she is becoming incontinent of her stool. Patient was [facility] for C Diff treatment as well as intractable nausea and vomiting.   Assessment & Plan:   Principal Problem:   C. difficile colitis Active Problems:   Diabetes mellitus without complication (HCC)   Epilepsy ([place])   Nausea & vomiting   Chronic pain   Diverticulitis   C Diff colitis -CT abd/pelvis: New colitis of the sigmoid and rectum with new pericolonic soft tissue stranding and a small amount of free fluid in the pelvis. -Transitioned to PO vancomycin on admission here, continue - pt with 9 BM recorded over past 24 hours and still with significant discomfort, will continue to monitor inpatient for now  Nausea/vomiting -Improved and tolerated small amount of full liquid diet. Will advance to soft diet and see how she tolerated PO antibiotics  -Continue antiemetics prn  AKI -Secondary to volume  depletion -Resolved with IVF   DM  -Lantus, SSI  Epilepsy -Continue depakote  HTN -Continue tenormin, lisinopril   Hypothyroidism -Continue levothyroxine   Chronic pain -Continue home meds   Hypokalemia -Replace, trend   Anxiety -Continue xanax   DVT prophylaxis: SCD Code Status: full  Family Communication: none at bedside Disposition Plan: pending improvement   Consultants:   none  Procedures:   none  Antimicrobials:  Anti-infectives (From admission, onward)   Start     Dose/Rate Route Frequency Ordered Stop   03/18/17 1100  vancomycin (VANCOCIN) 50 mg/mL oral solution 125 mg     125 mg Oral 4 times daily 03/18/17 0949 03/28/17 0959   03/17/17 1600  metroNIDAZOLE (FLAGYL) IVPB 500 mg  Status:  Discontinued     500 mg 100 mL/hr over 60 Minutes Intravenous Every 8 hours 03/17/17 1520 03/18/17 0949      Subjective: Pain in certain positions.  Sometimes worse with sitting.  Most in LLQ.  Tolerating diet.    Objective: Vitals:   03/18/17 0429 03/18/17 1339 03/18/17 2053 03/19/17 0433  BP: (!) 168/81 134/63 139/62 (!) 156/71  Pulse: 76 68 75 69  Resp: 14 16    Temp: 98.1 F (36.7 C) 98.2 F (36.8 C) 98.3 F (36.8 C) 98.1 F (36.7 C)  TempSrc: Oral Oral Oral Oral  SpO2: 100% 100% 96% 98%  Weight: 67.1 kg (147 lb 14.9 oz)     Height:        Intake/Output Summary (Last 24 hours) at 03/19/2017 1013 Last data filed at 03/18/2017 1700 Gross per 24 hour  Intake 340 ml  Output -  Net 340 ml   Filed Weights   03/17/17 2319 03/18/17 0429  Weight: 66.6 kg (146 lb 13.2 oz) 67.1 kg (147 lb 14.9 oz)    Examination:  General exam: Appears calm and comfortable, discomfort with movement Respiratory system: Clear to auscultation. Respiratory effort normal. Cardiovascular system: S1 & S2 heard, RRR. No JVD, murmurs, rubs, gallops or clicks. No pedal edema. Gastrointestinal system: Abdomen is nondistended, soft and with diffuse tenderness, most in  LLQ (no rebound or guarding). No organomegaly or masses felt. Normal bowel sounds heard. Central nervous system: Alert and oriented. No focal neurological deficits. Extremities: Symmetric 5 x 5 power. Skin: No rashes, lesions or ulcers Psychiatry: Judgement and insight appear normal. Mood & affect appropriate.     Data Reviewed: I have personally reviewed following labs and imaging studies  CBC: Recent Labs  Lab 03/17/17 1157 03/18/17 0409 03/19/17 0347  WBC 16.5* 12.0* 10.3  HGB 14.0 11.9* 10.8*  HCT 40.5 34.6* 31.2*  MCV 87.5 86.9 87.9  PLT 402* 331 017   Basic Metabolic Panel: Recent Labs  Lab 03/17/17 1157 03/18/17 0409 03/19/17 0347  NA 138 138 136  K 3.7 3.2* 3.5  CL 96* 104 104  CO2 26 25 24   GLUCOSE 283* 168* 146*  BUN 51* 44* 27*  CREATININE 1.26* 0.89 0.68  CALCIUM 9.3 8.3* 8.1*  MG 1.9  --   --    GFR: Estimated Creatinine Clearance: 63.1 mL/min (by C-G formula based on SCr of 0.68 mg/dL). Liver Function Tests: Recent Labs  Lab 03/17/17 1157  AST 18  ALT 17  ALKPHOS 72  BILITOT 0.8  PROT 7.9  ALBUMIN 3.2*   Recent Labs  Lab 03/17/17 1157  LIPASE 19   No results for input(s): AMMONIA in the last 168 hours. Coagulation Profile: No results for input(s): INR, PROTIME in the last 168 hours. Cardiac Enzymes: No results for input(s): CKTOTAL, CKMB, CKMBINDEX, TROPONINI in the last 168 hours. BNP (last 3 results) No results for input(s): PROBNP in the last 8760 hours. HbA1C: No results for input(s): HGBA1C in the last 72 hours. CBG: Recent Labs  Lab 03/18/17 0736 03/18/17 1157 03/18/17 1652 03/18/17 2049 03/19/17 0759  GLUCAP 136* 182* 96 201* 141*   Lipid Profile: No results for input(s): CHOL, HDL, LDLCALC, TRIG, CHOLHDL, LDLDIRECT in the last 72 hours. Thyroid Function Tests: No results for input(s): TSH, T4TOTAL, FREET4, T3FREE, THYROIDAB in the last 72 hours. Anemia Panel: No results for input(s): VITAMINB12, FOLATE, FERRITIN,  TIBC, IRON, RETICCTPCT in the last 72 hours. Sepsis Labs: No results for input(s): PROCALCITON, LATICACIDVEN in the last 168 hours.  No results found for this or any previous visit (from the past 240 hour(s)).       Radiology Studies: Ct Abdomen Pelvis Wo Contrast  Result Date: 03/17/2017 CLINICAL DATA:  Abdominal pain. EXAM: CT ABDOMEN AND PELVIS WITHOUT CONTRAST TECHNIQUE: Multidetector CT imaging of the abdomen and pelvis was performed following the standard protocol without IV contrast. COMPARISON:  CT scan dated 02/17/2017 FINDINGS: Lower chest: No acute abnormality. Hepatobiliary: No focal liver abnormality is seen. Status post cholecystectomy. No biliary dilatation. Pancreas: Unremarkable. No pancreatic ductal dilatation or surrounding inflammatory changes. Spleen: Normal in size without focal abnormality. Adrenals/Urinary Tract: Left pelvic kidney in the midline. Bilateral renal cysts as demonstrated previously. No hydronephrosis. Adrenal glands and bladder are normal. Stomach/Bowel: New thickening of the mucosa sigmoid portion of the colon with new adjacent new pericolonic soft  tissue stranding. Small amount of free fluid in the pelvis. No discrete abscess or free air at this time. Vascular/Lymphatic: Aortic atherosclerosis. No enlarged abdominal or pelvic lymph nodes. Reproductive: Status post hysterectomy. No adnexal masses. Other: None Musculoskeletal: No acute or significant osseous findings. IMPRESSION: 1. New colitis of the sigmoid and rectum with new pericolonic soft tissue stranding and a small amount of free fluid in the pelvis. 2. No evidence of perforation at this time. 3. Left pelvic kidney in the midline without acute abnormality. 4.  Aortic Atherosclerosis (ICD10-I70.0). Electronically Signed   By: Lorriane Shire M.D.   On: 03/17/2017 13:42        Scheduled Meds: . atenolol  50 mg Oral BID  . divalproex  500 mg Oral QHS  . insulin aspart  0-9 Units Subcutaneous TID WC   . insulin glargine  30 Units Subcutaneous Daily  . levothyroxine  200 mcg Oral QHS  . lisinopril  10 mg Oral QHS  . morphine  15 mg Oral Daily  . pregabalin  75 mg Oral BID  . saccharomyces boulardii  250 mg Oral BID  . sodium chloride flush  3 mL Intravenous Q12H  . vancomycin  125 mg Oral QID   Continuous Infusions: . sodium chloride       LOS: 1 day    Time spent: over 30 min    Fayrene Helper, MD [facility] Pager [phone]  If 7PM-7AM, please contact night-coverage [email or web address] Password [facility] 03/19/2017, 10:13 AM

## 2017-03-20 LAB — CBC
HEMATOCRIT: 31.9 % — AB (ref 36.0–46.0)
HEMOGLOBIN: 10.9 g/dL — AB (ref 12.0–15.0)
MCH: 29.9 pg (ref 26.0–34.0)
MCHC: 34.2 g/dL (ref 30.0–36.0)
MCV: 87.6 fL (ref 78.0–100.0)
Platelets: 303 10*3/uL (ref 150–400)
RBC: 3.64 MIL/uL — AB (ref 3.87–5.11)
RDW: 13.5 % (ref 11.5–15.5)
WBC: 11.3 10*3/uL — ABNORMAL HIGH (ref 4.0–10.5)

## 2017-03-20 LAB — GLUCOSE, CAPILLARY
GLUCOSE-CAPILLARY: 130 mg/dL — AB (ref 65–99)
Glucose-Capillary: 215 mg/dL — ABNORMAL HIGH (ref 65–99)
Glucose-Capillary: 205 mg/dL — ABNORMAL HIGH (ref 65–99)

## 2017-03-20 LAB — MAGNESIUM: MAGNESIUM: 1.5 mg/dL — AB (ref 1.7–2.4)

## 2017-03-20 LAB — BASIC METABOLIC PANEL
Anion gap: 7 (ref 5–15)
BUN: 19 mg/dL (ref 6–20)
CHLORIDE: 107 mmol/L (ref 101–111)
CO2: 25 mmol/L (ref 22–32)
CREATININE: 0.72 mg/dL (ref 0.44–1.00)
Calcium: 8.3 mg/dL — ABNORMAL LOW (ref 8.9–10.3)
GFR calc Af Amer: >60 mL/min (ref >60)
GFR calc non Af Amer: >60 mL/min (ref >60)
GLUCOSE: 204 mg/dL — AB (ref 65–99)
POTASSIUM: 3.6 mmol/L (ref 3.5–5.1)
SODIUM: 139 mmol/L (ref 135–145)

## 2017-03-20 MED ORDER — VANCOMYCIN 50 MG/ML ORAL SOLUTION: 125.0000 mg | Freq: Four times a day (QID) | ORAL | 0 refills | Status: AC

## 2017-03-20 MED ORDER — MAGNESIUM SULFATE 4 GM/100ML IV SOLN
4.0000 g | Freq: Once | INTRAVENOUS | Status: AC
  Administered 2017-03-20: 4 g via INTRAVENOUS
  Filled 2017-03-20: qty 100

## 2017-03-20 NOTE — Progress Notes (Signed)
Patient discharged to home with family, discharge instructions reviewed with patient who verbalized understanding. New RX given to patient. 

## 2017-03-20 NOTE — Discharge Summary (Addendum)
Physician Discharge Summary  Andrea [name] TDV:[id number] DOB: [birth year] DOA: 03/17/2017  PCP: Medicine, [place] date: 03/17/2017 Discharge date: 03/20/2017  Time spent: over 30 minutes  Recommendations for Outpatient Follow-up:  1. Follow up outpatient CBC/CMP/Mag 2. Pt with LLQ pain during admission, no rebound or guarding.  Would continue to follow exam and consider reimaging if pain worsens or does not continue to improve 3. Follow up with gastroenterology as planned 4. Ensure completion of PO vancomycin course  Discharge Diagnoses:  Principal Problem:   C. difficile colitis Active Problems:   Diabetes mellitus without complication ([place])   Epilepsy ([place])   Nausea & vomiting   Chronic pain   Diverticulitis   Discharge Condition: stable  Diet recommendation: heart healthy  Filed Weights   03/17/17 2319 03/18/17 0429  Weight: 66.6 kg (146 lb 13.2 oz) 67.1 kg (147 lb 14.9 oz)    History of present illness:  Andrea [name] a 67 yo female with past medical historysignificant ofdiabetes, hypertension, recent diagnosis and hospitalization for diverticulitis the beginning of this month was discharged on Cipro and Flagyl. She had a follow-up with her primary care physician last week. During that time she was still having some mild diarrhea which she told her primary care doctor. He did some stool studies and her stool studies came back with C. difficile on Friday2/22/19afternoon. She started to take Flagyl again on Friday but through the weekend she started to get worse with a lot more diarrhea associated with nausea and vomiting. She cannot hold down her Flagyl every time she tries to take that she throws it back up. She started running fever last night up to over 101. Patient is gotten to the point where she has to wear depends because she is becoming incontinent of her stool. [facility] Diff  treatment as well as intractable nausea and vomiting.  She was transitioned to oral vancomycin.  Her nausea and vomiting and diarrhea improved at discharge.  She was still having some LLQ pain, but no rebound or guarding and her pain was improving.  Discharged with plans for outpatient follow up and to complete 14 day course of vancomycin.   Hospital Course:  C Diff colitis -CT abd/pelvis:New colitis of the sigmoid and rectum with new pericolonic soft tissue stranding and a small amount of free fluid in the pelvis. -Transitioned to PO vancomycin on admission here, continue for 14 day course (notes this is first episode of c diff) - Pt with less BM, improving pain - mild leukocytosis today, follow  Nausea/vomiting -Improvedand tolerated small amount of full liquid diet. Will advance to soft diet and see how she tolerated PO antibiotics -Continue antiemetics prn  AKI -Secondary to volume depletion -Resolved with IVF   DM  -Lantus, SSI  Epilepsy -Continue depakote  HTN -Continue tenormin, lisinopril   Hypothyroidism -Continue levothyroxine   Chronic pain -Continue home meds   Hypokalemia -Replace, trend   Anxiety -Continue xanax  Procedures:  none  Consultations:  none  Discharge Exam: Vitals:   03/19/17 2204 03/20/17 0558  BP: 135/70 (!) 166/86  Pulse: 76 75  Resp: 16 16  Temp: 100 F (37.8 C) 99.5 F (37.5 C)  SpO2: 96% 99%   Less diarrhea.  3 BM's yesterday. Still with intermittent cramping.  Feels like she's generally getting better slowly.   General: No acute distress. Cardiovascular: Heart sounds show a regular rate, and rhythm. No gallops or rubs. No murmurs.  No JVD. Lungs: Clear to auscultation bilaterally with good air movement. No rales, rhonchi or wheezes. Abdomen: Soft, tender to palpation in LLQ (no rebound or guarding), nondistended with normal active bowel sounds. No masses. No hepatosplenomegaly. Neurological: Alert and  oriented 3. Moves all extremities 4 . Cranial nerves II through XII grossly intact. Skin: Warm and dry. No rashes or lesions. Extremities: No clubbing or cyanosis. No edema.[name] Psychiatric: Mood and affect are normal. Insight and judgment are appropriate.  Discharge Instructions   Discharge Instructions    Call MD for:  difficulty breathing, headache or visual disturbances   Complete by:  As directed    Call MD for:  extreme fatigue   Complete by:  As directed    Call MD for:  persistant dizziness or light-headedness   Complete by:  As directed    Call MD for:  persistant nausea and vomiting   Complete by:  As directed    Call MD for:  severe uncontrolled pain   Complete by:  As directed    Call MD for:  temperature >100.4   Complete by:  As directed    Diet - low sodium heart healthy   Complete by:  As directed    Discharge instructions   Complete by:  As directed    You were seen for C difficile colitis.  Your CT scan had evidence of inflammation in the colon, but no evidence of abscess or perforation.   We transitioned you to vancomycin and you seem to be improving with less diarrhea and your pain is gradually improving.  Continue the vancomycin for another 12 days.  Please follow up with your PCP within a few days for repeat labs and to ensure you continue to improve.    Follow up with GI as an outpatient as planned.   Return if you have fevers, worsening abdominal pain, or other new, recurrent, or worsening symptoms.   Please ask your PCP to request records from this hospitalization so they know what was done and what the next steps are.   Increase activity slowly   Complete by:  As directed      Allergies as of 03/20/2017      Reactions   Aspirin Nausea And Vomiting   Maxzide [hydrochlorothiazide W-triamterene] Swelling   Fluid retention   Metformin And Related Diarrhea   Nsaids Nausea And Vomiting   Other Other (See Comments)   All steroids produce psychosis per  pt Artificial sweeteners produce nausea and upset stomach.   Pravachol [pravastatin] Other (See Comments)   unknown   Stadol [butorphanol] Other (See Comments)   Toradol, etc And related- hallucinations   Toradol [ketorolac Tromethamine] Other (See Comments)   hallucinations   Vistaril [hydroxyzine Hcl] Other (See Comments)   unknown   Erythromycin Itching, Rash   Morphine And Related Hives, Rash   Iv morphine.   Penicillins Hives, Itching, Rash   Has patient had a PCN reaction causing immediate rash, facial/tongue/throat swelling, SOB or lightheadedness with hypotension: yes Has patient had a PCN reaction causing severe rash involving mucus membranes or skin necrosis: unknown Has patient had a PCN reaction that required hospitalization : unknown Has patient had a PCN reaction occurring within the last 10 years: no If all of the above answers are "NO", then may proceed with Cephalosporin use.      Medication List    TAKE these medications   ALPRAZolam 0.5 MG tablet Commonly known as:  XANAX TAKE 1 TABLET BY MOUTH  4 TIMES A DAY AS NEEDED FOR ANXIETY   atenolol 50 MG tablet Commonly known as:  TENORMIN Take 50 mg by mouth 2 (two) times daily.   divalproex 500 MG DR tablet Commonly known as:  DEPAKOTE Take 500 mg by mouth at bedtime.   EMBEDA 20-0.8 MG Cpcr Generic drug:  Morphine-Naltrexone Take 1 tablet by mouth daily.   insulin glargine 100 UNIT/ML injection Commonly known as:  LANTUS Inject 30 Units into the skin daily.   levothyroxine 200 MCG tablet Commonly known as:  SYNTHROID, LEVOTHROID Take 1 tablet (200 mcg total) by mouth at bedtime.   lisinopril 10 MG tablet Commonly known as:  PRINIVIL,ZESTRIL Take 10 mg by mouth at bedtime.   oxyCODONE-acetaminophen 10-325 MG tablet Commonly known as:  PERCOCET Take 1 tablet by mouth 3 (three) times daily as needed for pain.   pregabalin 75 MG capsule Commonly known as:  LYRICA Take 1 capsule (75 mg total) 2 (two)  times daily by mouth.   saccharomyces boulardii 250 MG capsule Commonly known as:  FLORASTOR Take 1 capsule (250 mg total) by mouth 2 (two) times daily.   vancomycin 50 mg/mL oral solution Commonly known as:  VANCOCIN Take 2.5 mLs (125 mg total) by mouth 4 (four) times daily for 12 days.      Allergies  Allergen Reactions  . Aspirin Nausea And Vomiting  . Maxzide [Hydrochlorothiazide W-Triamterene] Swelling    Fluid retention  . Metformin And Related Diarrhea  . Nsaids Nausea And Vomiting  . Other Other (See Comments)    All steroids produce psychosis per pt Artificial sweeteners produce nausea and upset stomach.  Gregary Cromer [Pravastatin] Other (See Comments)    unknown  . Stadol [Butorphanol] Other (See Comments)    Toradol, etc And related- hallucinations  . Toradol [Ketorolac Tromethamine] Other (See Comments)    hallucinations  . Vistaril [Hydroxyzine Hcl] Other (See Comments)    unknown  . Erythromycin Itching and Rash  . Morphine And Related Hives and Rash    Iv morphine.  . Penicillins Hives, Itching and Rash    Has patient had a PCN reaction causing immediate rash, facial/tongue/throat swelling, SOB or lightheadedness with hypotension: yes Has patient had a PCN reaction causing severe rash involving mucus membranes or skin necrosis: unknown Has patient had a PCN reaction that required hospitalization : unknown Has patient had a PCN reaction occurring within the last 10 years: no If all of the above answers are "NO", then may proceed with Cephalosporin use.    Follow-up Information    Medicine, [facility].   Specialty:  Family Medicine Why:  Follow up in a few days for repeat labs and to ensure you're continuing to improve Contact information: [place] [address] [phone]            The results of significant diagnostics from this hospitalization (including imaging, microbiology, ancillary and  laboratory) are listed below for reference.    Significant Diagnostic Studies: Ct Abdomen Pelvis Wo Contrast  Result Date: 03/17/2017 CLINICAL DATA:  Abdominal pain. EXAM: CT ABDOMEN AND PELVIS WITHOUT CONTRAST TECHNIQUE: Multidetector CT imaging of the abdomen and pelvis was performed following the standard protocol without IV contrast. COMPARISON:  CT scan dated 02/17/2017 FINDINGS: Lower chest: No acute abnormality. Hepatobiliary: No focal liver abnormality is seen. Status post cholecystectomy. No biliary dilatation. Pancreas: Unremarkable. No pancreatic ductal dilatation or surrounding inflammatory changes. Spleen: Normal in size without focal abnormality. Adrenals/Urinary  Tract: Left pelvic kidney in the midline. Bilateral renal cysts as demonstrated previously. No hydronephrosis. Adrenal glands and bladder are normal. Stomach/Bowel: New thickening of the mucosa sigmoid portion of the colon with new adjacent new pericolonic soft tissue stranding. Small amount of free fluid in the pelvis. No discrete abscess or free air at this time. Vascular/Lymphatic: Aortic atherosclerosis. No enlarged abdominal or pelvic lymph nodes. Reproductive: Status post hysterectomy. No adnexal masses. Other: None Musculoskeletal: No acute or significant osseous findings. IMPRESSION: 1. New colitis of the sigmoid and rectum with new pericolonic soft tissue stranding and a small amount of free fluid in the pelvis. 2. No evidence of perforation at this time. 3. Left pelvic kidney in the midline without acute abnormality. 4.  Aortic Atherosclerosis (ICD10-I70.0). Electronically Signed   By: Lorriane Shire M.D.   On: 03/17/2017 13:42    Microbiology: No results found for this or any previous visit (from the past 240 hour(s)).   Labs: Basic Metabolic Panel: Recent Labs  Lab 03/17/17 1157 03/18/17 0409 03/19/17 0347 03/20/17 0326  NA 138 138 136 139  K 3.7 3.2* 3.5 3.6  CL 96* 104 104 107  CO2 26 25 24 25   GLUCOSE 283*  168* 146* 204*  BUN 51* 44* 27* 19  CREATININE 1.26* 0.89 0.68 0.72  CALCIUM 9.3 8.3* 8.1* 8.3*  MG 1.9  --   --  1.5*   Liver Function Tests: Recent Labs  Lab 03/17/17 1157  AST 18  ALT 17  ALKPHOS 72  BILITOT 0.8  PROT 7.9  ALBUMIN 3.2*   Recent Labs  Lab 03/17/17 1157  LIPASE 19   No results for input(s): AMMONIA in the last 168 hours. CBC: Recent Labs  Lab 03/17/17 1157 03/18/17 0409 03/19/17 0347 03/20/17 0326  WBC 16.5* 12.0* 10.3 11.3*  HGB 14.0 11.9* 10.8* 10.9*  HCT 40.5 34.6* 31.2* 31.9*  MCV 87.5 86.9 87.9 87.6  PLT 402* 331 283 303   Cardiac Enzymes: No results for input(s): CKTOTAL, CKMB, CKMBINDEX, TROPONINI in the last 168 hours. BNP: BNP (last 3 results) No results for input(s): BNP in the last 8760 hours.  ProBNP (last 3 results) No results for input(s): PROBNP in the last 8760 hours.  CBG: Recent Labs  Lab 03/19/17 1147 03/19/17 1630 03/19/17 2201 03/20/17 0801 03/20/17 1213  GLUCAP 202* 178* 151* 130* 215*       Signed:  Fayrene Helper MD.  [facility] 03/20/2017, 12:28 PM

## 2017-06-01 ENCOUNTER — Inpatient Hospital Stay (HOSPITAL_COMMUNITY)
Admission: EM | Admit: 2017-06-01 | Discharge: 2017-06-05 | DRG: 638 | Disposition: A | Discharge status: Home Health | Payer: Medicare Other | Attending: Internal Medicine | Admitting: Internal Medicine

## 2017-06-01 ENCOUNTER — Other Ambulatory Visit: Payer: Self-pay

## 2017-06-01 ENCOUNTER — Encounter (HOSPITAL_COMMUNITY): Payer: Self-pay | Admitting: Emergency Medicine

## 2017-06-01 ENCOUNTER — Emergency Department (HOSPITAL_COMMUNITY): Payer: Medicare Other

## 2017-06-01 DIAGNOSIS — R531 Weakness: Secondary | ICD-10-CM | POA: Diagnosis not present

## 2017-06-01 DIAGNOSIS — E11649 Type 2 diabetes mellitus with hypoglycemia without coma: Secondary | ICD-10-CM | POA: Diagnosis not present

## 2017-06-01 DIAGNOSIS — Z79899 Other long term (current) drug therapy: Secondary | ICD-10-CM | POA: Diagnosis not present

## 2017-06-01 DIAGNOSIS — G8929 Other chronic pain: Secondary | ICD-10-CM | POA: Diagnosis present

## 2017-06-01 DIAGNOSIS — R000 Tachycardia, unspecified: Secondary | ICD-10-CM | POA: Diagnosis not present

## 2017-06-01 DIAGNOSIS — E039 Hypothyroidism, unspecified: Secondary | ICD-10-CM | POA: Diagnosis present

## 2017-06-01 DIAGNOSIS — R1032 Left lower quadrant pain: Secondary | ICD-10-CM | POA: Diagnosis not present

## 2017-06-01 DIAGNOSIS — R296 Repeated falls: Secondary | ICD-10-CM | POA: Diagnosis not present

## 2017-06-01 DIAGNOSIS — Z8619 Personal history of other infectious and parasitic diseases: Secondary | ICD-10-CM

## 2017-06-01 DIAGNOSIS — Z79890 Hormone replacement therapy (postmenopausal): Secondary | ICD-10-CM | POA: Diagnosis not present

## 2017-06-01 DIAGNOSIS — R109 Unspecified abdominal pain: Secondary | ICD-10-CM

## 2017-06-01 DIAGNOSIS — R739 Hyperglycemia, unspecified: Secondary | ICD-10-CM

## 2017-06-01 DIAGNOSIS — E1165 Type 2 diabetes mellitus with hyperglycemia: Principal | ICD-10-CM | POA: Diagnosis present

## 2017-06-01 DIAGNOSIS — R112 Nausea with vomiting, unspecified: Secondary | ICD-10-CM

## 2017-06-01 DIAGNOSIS — Z885 Allergy status to narcotic agent status: Secondary | ICD-10-CM

## 2017-06-01 DIAGNOSIS — Z888 Allergy status to other drugs, medicaments and biological substances status: Secondary | ICD-10-CM

## 2017-06-01 DIAGNOSIS — I10 Essential (primary) hypertension: Secondary | ICD-10-CM | POA: Diagnosis not present

## 2017-06-01 DIAGNOSIS — Z9049 Acquired absence of other specified parts of digestive tract: Secondary | ICD-10-CM | POA: Diagnosis not present

## 2017-06-01 DIAGNOSIS — E860 Dehydration: Secondary | ICD-10-CM | POA: Diagnosis present

## 2017-06-01 DIAGNOSIS — Z9013 Acquired absence of bilateral breasts and nipples: Secondary | ICD-10-CM | POA: Diagnosis not present

## 2017-06-01 DIAGNOSIS — Z794 Long term (current) use of insulin: Secondary | ICD-10-CM | POA: Diagnosis not present

## 2017-06-01 DIAGNOSIS — K219 Gastro-esophageal reflux disease without esophagitis: Secondary | ICD-10-CM | POA: Diagnosis present

## 2017-06-01 DIAGNOSIS — G40909 Epilepsy, unspecified, not intractable, without status epilepticus: Secondary | ICD-10-CM | POA: Diagnosis present

## 2017-06-01 DIAGNOSIS — E119 Type 2 diabetes mellitus without complications: Secondary | ICD-10-CM

## 2017-06-01 DIAGNOSIS — R197 Diarrhea, unspecified: Secondary | ICD-10-CM | POA: Diagnosis present

## 2017-06-01 DIAGNOSIS — Z9221 Personal history of antineoplastic chemotherapy: Secondary | ICD-10-CM | POA: Diagnosis not present

## 2017-06-01 DIAGNOSIS — Z881 Allergy status to other antibiotic agents status: Secondary | ICD-10-CM

## 2017-06-01 DIAGNOSIS — N179 Acute kidney failure, unspecified: Secondary | ICD-10-CM

## 2017-06-01 DIAGNOSIS — D899 Disorder involving the immune mechanism, unspecified: Secondary | ICD-10-CM | POA: Diagnosis not present

## 2017-06-01 DIAGNOSIS — Z90710 Acquired absence of both cervix and uterus: Secondary | ICD-10-CM

## 2017-06-01 DIAGNOSIS — Z853 Personal history of malignant neoplasm of breast: Secondary | ICD-10-CM | POA: Diagnosis not present

## 2017-06-01 DIAGNOSIS — IMO0001 Reserved for inherently not codable concepts without codable children: Secondary | ICD-10-CM

## 2017-06-01 DIAGNOSIS — Z886 Allergy status to analgesic agent status: Secondary | ICD-10-CM

## 2017-06-01 DIAGNOSIS — Z880 Allergy status to penicillin: Secondary | ICD-10-CM

## 2017-06-01 LAB — CBC WITH DIFFERENTIAL/PLATELET
BASOS ABS: 0 10*3/uL (ref 0.0–0.1)
BASOS PCT: 0 %
Eosinophils Absolute: 0 10*3/uL (ref 0.0–0.7)
Eosinophils Relative: 0 %
HEMATOCRIT: 41.5 % (ref 36.0–46.0)
HEMOGLOBIN: 15.1 g/dL — AB (ref 12.0–15.0)
LYMPHS PCT: 15 %
Lymphs Abs: 2.4 10*3/uL (ref 0.7–4.0)
MCH: 31.3 pg (ref 26.0–34.0)
MCHC: 36.4 g/dL — ABNORMAL HIGH (ref 30.0–36.0)
MCV: 86.1 fL (ref 78.0–100.0)
Monocytes Absolute: 1.1 10*3/uL — ABNORMAL HIGH (ref 0.1–1.0)
Monocytes Relative: 7 %
NEUTROS ABS: 12.2 10*3/uL — AB (ref 1.7–7.7)
NEUTROS PCT: 78 %
Platelets: 361 10*3/uL (ref 150–400)
RBC: 4.82 MIL/uL (ref 3.87–5.11)
RDW: 12.5 % (ref 11.5–15.5)
WBC: 15.7 10*3/uL — ABNORMAL HIGH (ref 4.0–10.5)

## 2017-06-01 LAB — URINALYSIS, ROUTINE W REFLEX MICROSCOPIC
Bilirubin Urine: NEGATIVE
Glucose, UA: >=500 mg/dL — AB
KETONES UR: 5 mg/dL — AB
LEUKOCYTES UA: NEGATIVE
Nitrite: NEGATIVE
Specific Gravity, Urine: 1.028 (ref 1.005–1.030)
pH: 5 (ref 5.0–8.0)

## 2017-06-01 LAB — BASIC METABOLIC PANEL
ANION GAP: 16 — AB (ref 5–15)
BUN: 49 mg/dL — ABNORMAL HIGH (ref 6–20)
CALCIUM: 9.3 mg/dL (ref 8.9–10.3)
CHLORIDE: 91 mmol/L — AB (ref 101–111)
CO2: 28 mmol/L (ref 22–32)
Creatinine, Ser: 1.37 mg/dL — ABNORMAL HIGH (ref 0.44–1.00)
GFR calc non Af Amer: 39 mL/min — ABNORMAL LOW (ref >60)
GFR, EST AFRICAN AMERICAN: 45 mL/min — AB (ref >60)
Glucose, Bld: 395 mg/dL — ABNORMAL HIGH (ref 65–99)
POTASSIUM: 3.8 mmol/L (ref 3.5–5.1)
Sodium: 135 mmol/L (ref 135–145)

## 2017-06-01 LAB — CBG MONITORING, ED: Glucose-Capillary: 274 mg/dL — ABNORMAL HIGH (ref 65–99)

## 2017-06-01 LAB — GLUCOSE, CAPILLARY
GLUCOSE-CAPILLARY: 231 mg/dL — AB (ref 65–99)
Glucose-Capillary: 301 mg/dL — ABNORMAL HIGH (ref 65–99)

## 2017-06-01 LAB — POC OCCULT BLOOD, ED: Fecal Occult Bld: NEGATIVE

## 2017-06-01 MED ORDER — INSULIN ASPART 100 UNIT/ML ~~LOC~~ SOLN
0.0000 [IU] | Freq: Three times a day (TID) | SUBCUTANEOUS | Status: DC
  Administered 2017-06-01: 3 [IU] via SUBCUTANEOUS
  Administered 2017-06-02: 2 [IU] via SUBCUTANEOUS
  Administered 2017-06-02: 3 [IU] via SUBCUTANEOUS
  Administered 2017-06-02 – 2017-06-03 (×2): 1 [IU] via SUBCUTANEOUS
  Administered 2017-06-03 (×2): 3 [IU] via SUBCUTANEOUS
  Administered 2017-06-04: 1 [IU] via SUBCUTANEOUS
  Administered 2017-06-04: 3 [IU] via SUBCUTANEOUS
  Administered 2017-06-05: 1 [IU] via SUBCUTANEOUS
  Administered 2017-06-05: 2 [IU] via SUBCUTANEOUS

## 2017-06-01 MED ORDER — LEVOTHYROXINE SODIUM 100 MCG PO TABS
200.0000 ug | ORAL_TABLET | Freq: Every day | ORAL | Status: DC
  Administered 2017-06-01 – 2017-06-04 (×4): 200 ug via ORAL
  Filled 2017-06-01 (×4): qty 2

## 2017-06-01 MED ORDER — ATENOLOL 50 MG PO TABS
50.0000 mg | ORAL_TABLET | Freq: Two times a day (BID) | ORAL | Status: DC
  Administered 2017-06-01 – 2017-06-05 (×8): 50 mg via ORAL
  Filled 2017-06-01 (×8): qty 1

## 2017-06-01 MED ORDER — ONDANSETRON HCL 4 MG/2ML IJ SOLN
4.0000 mg | Freq: Four times a day (QID) | INTRAMUSCULAR | Status: DC | PRN
  Administered 2017-06-02 – 2017-06-05 (×9): 4 mg via INTRAVENOUS
  Filled 2017-06-01 (×9): qty 2

## 2017-06-01 MED ORDER — FENTANYL CITRATE (PF) 100 MCG/2ML IJ SOLN
100.0000 ug | Freq: Once | INTRAMUSCULAR | Status: AC
  Administered 2017-06-01: 100 ug via INTRAVENOUS
  Filled 2017-06-01: qty 2

## 2017-06-01 MED ORDER — PREGABALIN 75 MG PO CAPS
75.0000 mg | ORAL_CAPSULE | Freq: Two times a day (BID) | ORAL | Status: DC
  Administered 2017-06-01 – 2017-06-05 (×8): 75 mg via ORAL
  Filled 2017-06-01 (×8): qty 1

## 2017-06-01 MED ORDER — ALPRAZOLAM 0.5 MG PO TABS
0.5000 mg | ORAL_TABLET | Freq: Three times a day (TID) | ORAL | Status: DC | PRN
  Administered 2017-06-01 – 2017-06-05 (×5): 0.5 mg via ORAL
  Filled 2017-06-01 (×5): qty 1

## 2017-06-01 MED ORDER — MORPHINE-NALTREXONE 20-0.8 MG PO CPCR
1.0000 | ORAL_CAPSULE | Freq: Every day | ORAL | Status: DC
  Administered 2017-06-03 – 2017-06-04 (×2): 1 via ORAL

## 2017-06-01 MED ORDER — INSULIN ASPART 100 UNIT/ML ~~LOC~~ SOLN
3.0000 [IU] | Freq: Three times a day (TID) | SUBCUTANEOUS | Status: DC
  Administered 2017-06-03 – 2017-06-05 (×6): 3 [IU] via SUBCUTANEOUS

## 2017-06-01 MED ORDER — SODIUM CHLORIDE 0.9 % IV BOLUS
1000.0000 mL | Freq: Once | INTRAVENOUS | Status: AC
  Administered 2017-06-01: 1000 mL via INTRAVENOUS

## 2017-06-01 MED ORDER — METOCLOPRAMIDE HCL 5 MG/ML IJ SOLN
5.0000 mg | Freq: Once | INTRAMUSCULAR | Status: AC
  Administered 2017-06-01: 5 mg via INTRAVENOUS
  Filled 2017-06-01: qty 2

## 2017-06-01 MED ORDER — FENTANYL 100 MCG/HR TD PT72
100.0000 ug | MEDICATED_PATCH | TRANSDERMAL | Status: DC
  Administered 2017-06-01 – 2017-06-04 (×2): 100 ug via TRANSDERMAL
  Filled 2017-06-01 (×3): qty 1

## 2017-06-01 MED ORDER — INSULIN GLARGINE 100 UNIT/ML ~~LOC~~ SOLN
25.0000 [IU] | Freq: Every day | SUBCUTANEOUS | Status: DC
  Administered 2017-06-01: 25 [IU] via SUBCUTANEOUS
  Filled 2017-06-01: qty 0.25

## 2017-06-01 MED ORDER — ONDANSETRON HCL 4 MG/2ML IJ SOLN
4.0000 mg | Freq: Once | INTRAMUSCULAR | Status: AC
  Administered 2017-06-01: 4 mg via INTRAVENOUS
  Filled 2017-06-01: qty 2

## 2017-06-01 MED ORDER — SACCHAROMYCES BOULARDII 250 MG PO CAPS
250.0000 mg | ORAL_CAPSULE | Freq: Two times a day (BID) | ORAL | Status: DC
  Administered 2017-06-01 – 2017-06-05 (×8): 250 mg via ORAL
  Filled 2017-06-01 (×8): qty 1

## 2017-06-01 MED ORDER — INSULIN ASPART 100 UNIT/ML IV SOLN
10.0000 [IU] | Freq: Once | INTRAVENOUS | Status: AC
  Administered 2017-06-01: 10 [IU] via INTRAVENOUS
  Filled 2017-06-01: qty 0.1

## 2017-06-01 MED ORDER — FENTANYL CITRATE (PF) 100 MCG/2ML IJ SOLN
50.0000 ug | INTRAMUSCULAR | Status: DC | PRN
  Administered 2017-06-02 – 2017-06-05 (×11): 50 ug via INTRAVENOUS
  Filled 2017-06-01 (×11): qty 2

## 2017-06-01 MED ORDER — IOPAMIDOL (ISOVUE-300) INJECTION 61%
100.0000 mL | Freq: Once | INTRAVENOUS | Status: AC | PRN
  Administered 2017-06-01: 70 mL via INTRAVENOUS

## 2017-06-01 MED ORDER — SODIUM CHLORIDE 0.9 % IV SOLN
INTRAVENOUS | Status: AC
  Administered 2017-06-01: 18:00:00 via INTRAVENOUS

## 2017-06-01 MED ORDER — INSULIN ASPART 100 UNIT/ML ~~LOC~~ SOLN
0.0000 [IU] | Freq: Every day | SUBCUTANEOUS | Status: DC
  Administered 2017-06-01: 4 [IU] via SUBCUTANEOUS

## 2017-06-01 MED ORDER — ACETAMINOPHEN 325 MG PO TABS: 650.0000 mg | ORAL_TABLET | Freq: Four times a day (QID) | ORAL | Status: DC | PRN

## 2017-06-01 MED ORDER — HEPARIN SODIUM (PORCINE) 5000 UNIT/ML IJ SOLN
5000.0000 [IU] | Freq: Three times a day (TID) | INTRAMUSCULAR | Status: DC
  Administered 2017-06-01 – 2017-06-05 (×9): 5000 [IU] via SUBCUTANEOUS
  Filled 2017-06-01 (×10): qty 1

## 2017-06-01 MED ORDER — SODIUM CHLORIDE 0.9 % IV BOLUS
2000.0000 mL | Freq: Once | INTRAVENOUS | Status: AC
  Administered 2017-06-01: 2000 mL via INTRAVENOUS

## 2017-06-01 MED ORDER — ACETAMINOPHEN 650 MG RE SUPP: 650.0000 mg | Freq: Four times a day (QID) | RECTAL | Status: DC | PRN

## 2017-06-01 MED ORDER — FENTANYL CITRATE (PF) 100 MCG/2ML IJ SOLN
100.0000 ug | Freq: Once | INTRAMUSCULAR | Status: DC
  Administered 2017-06-01: 100 ug via INTRAVENOUS
  Filled 2017-06-01: qty 2

## 2017-06-01 MED ORDER — ONDANSETRON HCL 4 MG PO TABS
4.0000 mg | ORAL_TABLET | Freq: Four times a day (QID) | ORAL | Status: DC | PRN
  Administered 2017-06-01 – 2017-06-03 (×4): 4 mg via ORAL
  Filled 2017-06-01 (×5): qty 1

## 2017-06-01 NOTE — ED Notes (Signed)
Bed: JK09 Expected date:  Expected time:  Means of arrival:  Comments: Hold triage

## 2017-06-01 NOTE — ED Triage Notes (Signed)
Pt c/o vomiting since 05/29/2017 and emesis became coffee ground-like emesis last night. Pt states weakness.

## 2017-06-01 NOTE — ED Triage Notes (Signed)
Pt states she has hx of diverticulosis and has had c.diff in February x 2 episodes. Pt with weakness and states difficulty ambulating.

## 2017-06-01 NOTE — H&P (Signed)
History and Physical    Andrea [name] HAL:[id number] DOB: 08-22-1950 DOA: 06/01/2017  PCP: [facility]  Patient coming from: home  I have personally briefly reviewed patient's old medical records in [place]  Chief Complaint: nausea, vomiting and abd pain  HPI: Andrea [name] is a 67 y.o. female with medical history significant of past medical history of C. difficile colitis on February 2019, with a previous episode of acute diverticulitis on that same month, with a remote history of breast cancer diabetes mellitus type 2 chronic pain that comes in to the hospital complaining of left lower quadrant pain accompanied by nausea and vomiting that started 3 days prior to admission she relates she is vomited more than 5 times per day with 2 of those episodes of emesis with hematemesis, and more than 5 episodes of watery stool but no blood, with the abdominal pain that started the day prior to admission in the left lower quadrant with no radiation nothing makes it better nothing makes it worse with decreased oral intake, she denies any sick contacts no fevers, she denies any shortness of breath or chest pain, she denies any recent antibiotics use any changes or new medications.   ED Course:  She was found to be mildly tachycardic, with a rise in her creatinine of 1.3 a CBC with a left shift of 12 and a blood glucose of over 400 with a CT scan as below so we are consulted for further evaluation.  Review of Systems: As per HPI otherwise 10 point review of systems negative.    Past Medical History:  Diagnosis Date  . Breast cancer ([place])    bilat mastectomy and LUE lymphadenectomy 2014, chemoRx 2014 - 15  . Diabetes mellitus without complication ([place])   . Esophageal reflux   . Hypertension   . Hypothyroid   . Migraine   . Seizure [facility])     Past Surgical History:  Procedure Laterality Date  . ABDOMINAL HYSTERECTOMY     1984 , done for ruptured cyst  and endometriosis  . APPENDECTOMY    . CHOLECYSTECTOMY     1980's  . EYE SURGERY  08/28/2015   Right eye  . KNEE SURGERY Left   . MASTECTOMY     bilateral  . Open surgery for bowel obstruction, 2000's       reports that she has never smoked. She has never used smokeless tobacco. She reports that she does not drink alcohol or use drugs.  Allergies  Allergen Reactions  . Aspirin Nausea And Vomiting  . Maxzide [Hydrochlorothiazide W-Triamterene] Swelling    Fluid retention  . Metformin And Related Diarrhea  . Nsaids Nausea And Vomiting  . Other Other (See Comments)    All steroids produce psychosis per pt Artificial sweeteners produce nausea and upset stomach.  Gregary Cromer [Pravastatin] Other (See Comments)    unknown  . Stadol [Butorphanol] Other (See Comments)    Toradol, etc And related- hallucinations  . Toradol [Ketorolac Tromethamine] Other (See Comments)    hallucinations  . Vistaril [Hydroxyzine Hcl] Other (See Comments)    unknown  . Erythromycin Itching and Rash  . Morphine And Related Hives and Rash    Iv morphine.  . Penicillins Hives, Itching and Rash    Has patient had a PCN reaction causing immediate rash, facial/tongue/throat swelling, SOB or lightheadedness with hypotension: yes Has patient had a PCN reaction causing severe rash involving mucus membranes or skin necrosis: unknown Has patient had a  PCN reaction that required hospitalization : unknown Has patient had a PCN reaction occurring within the last 10 years: no If all of the above answers are "NO", then may proceed with Cephalosporin use.     Family History  Problem Relation Age of Onset  . Sudden Cardiac Death Neg Hx      Prior to Admission medications   Medication Sig Start Date End Date Taking? Authorizing Provider  ALPRAZolam (XANAX) 0.5 MG tablet TAKE 1 TABLET BY MOUTH 4 TIMES A DAY AS NEEDED FOR ANXIETY 11/21/16  Yes [provider]  atenolol (TENORMIN) 100 MG tablet Take 50 mg  by mouth 2 (two) times daily.   Yes [provider]  dicyclomine (BENTYL) 10 MG capsule TK ONE C PO TID [facility] 05/05/17  Yes [provider]  EMBEDA 20-0.8 MG CPCR Take 1 tablet by mouth daily. 10/24/16  Yes [provider]  LANTUS SOLOSTAR 100 UNIT/ML Solostar Pen INJECT 20 - 50 UNITS UNDER THE SKIN DAILY AS DIRECTED 03/20/17  Yes [provider]  levothyroxine (SYNTHROID, LEVOTHROID) 200 MCG tablet Take 1 tablet (200 mcg total) by mouth at bedtime. Patient taking differently: Take 200 mcg by mouth daily before breakfast.  01/12/17  Yes Lavina Hamman, MD  lisinopril (PRINIVIL,ZESTRIL) 10 MG tablet Take 10 mg by mouth at bedtime.    Yes [provider]  oxyCODONE-acetaminophen (PERCOCET) 10-325 MG tablet Take 1 tablet by mouth 3 (three) times daily as needed for pain. 02/03/17  Yes [provider]  pregabalin (LYRICA) 75 MG capsule Take 1 capsule (75 mg total) 2 (two) times daily by mouth. 11/24/16  Yes Osei-Bonsu, Iona Beard, MD  promethazine (PHENERGAN) 25 MG tablet TK 1 T PO Q 6 H PRF NAUSEA 05/30/17  Yes [provider]  saccharomyces boulardii (FLORASTOR) 250 MG capsule Take 1 capsule (250 mg total) by mouth 2 (two) times daily. 02/24/17  Yes Patrecia Pour, MD    Physical Exam: Vitals:   06/01/17 1500 06/01/17 1530 06/01/17 1543 06/01/17 1558  BP: (!) 131/50 (!) 120/28  (!) 121/49  Pulse: (!) 125 (!) 127 (!) 127 (!) 124  Resp:   16 16  Temp:      TempSrc:      SpO2: 98% 96%  96%    Constitutional: NAD, calm, comfortable Vitals:   06/01/17 1500 06/01/17 1530 06/01/17 1543 06/01/17 1558  BP: (!) 131/50 (!) 120/28  (!) 121/49  Pulse: (!) 125 (!) 127 (!) 127 (!) 124  Resp:   16 16  Temp:      TempSrc:      SpO2: 98% 96%  96%   Eyes: PERRL, lids and conjunctivae normal ENMT: Mucous membranes are moist. Posterior pharynx clear of any exudate or lesions.Normal dentition.  Neck: normal, supple, no masses, no thyromegaly Respiratory:  clear to auscultation bilaterally, no wheezing, no crackles. Normal respiratory effort. No accessory muscle use.  Cardiovascular: Regular rate and rhythm, no murmurs / rubs / gallops. No extremity edema. 2+ pedal pulses. No carotid bruits.  Abdomen: Positive bowel sounds, soft no masses no hepatomegaly no rebound no guarding but tender left lower quadrant tenderness no rebound or guarding Musculoskeletal: no clubbing / cyanosis. No joint deformity upper and lower extremities. Good ROM, no contractures. Normal muscle tone.  Skin: no rashes, lesions, ulcers. No induration Neurologic: CN 2-12 grossly intact. Sensation intact, DTR normal. Strength 5/5 in all 4.  Psychiatric: Normal judgment and insight. Alert and oriented x 3. Normal mood.   Labs  on Admission: I have personally reviewed following labs and imaging studies  CBC: Recent Labs  Lab 06/01/17 1108  WBC 15.7*  NEUTROABS 12.2*  HGB 15.1*  HCT 41.5  MCV 86.1  PLT 735   Basic Metabolic Panel: Recent Labs  Lab 06/01/17 1108  NA 135  K 3.8  CL 91*  CO2 28  GLUCOSE 395*  BUN 49*  CREATININE 1.37*  CALCIUM 9.3   GFR: CrCl cannot be calculated (Unknown ideal weight.). Liver Function Tests: No results for input(s): AST, ALT, ALKPHOS, BILITOT, PROT, ALBUMIN in the last 168 hours. No results for input(s): LIPASE, AMYLASE in the last 168 hours. No results for input(s): AMMONIA in the last 168 hours. Coagulation Profile: No results for input(s): INR, PROTIME in the last 168 hours. Cardiac Enzymes: No results for input(s): CKTOTAL, CKMB, CKMBINDEX, TROPONINI in the last 168 hours. BNP (last 3 results) No results for input(s): PROBNP in the last 8760 hours. HbA1C: No results for input(s): HGBA1C in the last 72 hours. CBG: No results for input(s): GLUCAP in the last 168 hours. Lipid Profile: No results for input(s): CHOL, HDL, LDLCALC, TRIG, CHOLHDL, LDLDIRECT in the last 72 hours. Thyroid Function Tests: No results for  input(s): TSH, T4TOTAL, FREET4, T3FREE, THYROIDAB in the last 72 hours. Anemia Panel: No results for input(s): VITAMINB12, FOLATE, FERRITIN, TIBC, IRON, RETICCTPCT in the last 72 hours. Urine analysis:    Component Value Date/Time   COLORURINE YELLOW 06/01/2017 1108   APPEARANCEUR HAZY (A) 06/01/2017 1108   LABSPEC 1.028 06/01/2017 1108   PHURINE 5.0 06/01/2017 1108   GLUCOSEU >=500 (A) 06/01/2017 1108   HGBUR MODERATE (A) 06/01/2017 1108   BILIRUBINUR NEGATIVE 06/01/2017 1108   KETONESUR 5 (A) 06/01/2017 1108   PROTEINUR >=300 (A) 06/01/2017 1108   UROBILINOGEN 1.0 12/02/2013 1612   NITRITE NEGATIVE 06/01/2017 1108   LEUKOCYTESUR NEGATIVE 06/01/2017 1108    Radiological Exams on Admission: Ct Abdomen Pelvis W Contrast  Result Date: 06/01/2017 CLINICAL DATA:  History of diverticulosis and colitis.  Weakness. EXAM: CT ABDOMEN AND PELVIS WITH CONTRAST TECHNIQUE: Multidetector CT imaging of the abdomen and pelvis was performed using the standard protocol following bolus administration of intravenous contrast. CONTRAST:  50mL ISOVUE-300 IOPAMIDOL (ISOVUE-300) INJECTION 61% COMPARISON:  02/17/2017.  03/17/2017. FINDINGS: Lower chest: Normal Hepatobiliary: Mild diffuse fatty change of the liver. Previous cholecystectomy. Pancreas: Normal Spleen: Normal Adrenals/Urinary Tract: Adrenal glands are normal. Right kidney is normal except for a 1 cm cyst in the midportion. Left kidney shows a central pelvic location and contains a few small cysts. No obstruction or significant lesion. Stomach/Bowel: No evidence of inflammatory disease presently. No obstruction. No other focal finding. Vascular/Lymphatic: Aortic atherosclerosis. No aneurysm. IVC is normal. No retroperitoneal adenopathy. Reproductive: Previous hysterectomy.  No pelvic mass. Other: No free fluid or air. Musculoskeletal: Old superior endplate fractures at L3 and T9. IMPRESSION: No acute finding by CT. No evidence of bowel inflammation or  obstruction presently. Electronically Signed   By: Nelson Chimes M.D.   On: 06/01/2017 14:06    EKG: Independently reviewed. none  Assessment/Plan Intractable nausea and vomiting/ Diarrhea CT scan of the abdomen and pelvis showed no acute evidence of inflammation perforation or acute findings I have reviewed these personally. She has had a history of C. difficile colitis I will go ahead and check her for C. difficile colitis she does have a rise in her creatinine with a mild leukocytosis. She denies any fevers, also on the differential for this could be mild  hyperglycemic hyperosmotic state she does not have a gap, we will treat her for now with IV fluid hydration and long-acting insulin plus sliding scale insulin. We will give her Zofran for nausea, allow her full liquid diet. We will hydrate aggressively.  See below for further details.  LeukoCytosis: She has remained afebrile over unclear etiology, probably related to her intractable nausea and vomiting. She has remained afebrile, I will get a urine culture and blood culture, will hold on on antibiotics at this point in time.  diabetes mellitus without complication/  Hyperglycemia: She knew her on her long-acting insulin at a lower dose plus sliding scale insulin.  AKI (acute kidney injury) ([place]) Definitely prerenal in etiology due to her multiple episodes of nausea vomiting and diarrhea decreased oral intake and in the setting of ACE inhibitor use. We will hold ACE inhibitor start IV fluids, monitor strict I's and O's and check a basic metabolic panel in the morning.    DVT prophylaxis: scd Code Status: Full Family Communication: none Disposition Plan:  Consults called: none Admission status: inpatient   Charlynne Cousins MD [facility] Pager [phone]  If 7PM-7AM, please contact night-coverage [email or web address] Password [email or web address]  06/01/2017, 4:20 PM

## 2017-06-01 NOTE — ED Provider Notes (Signed)
[facility] DEPT Provider Note   CSN: [id number] Arrival date & time: 06/01/17  0300     History   Chief Complaint Chief Complaint  Patient presents with  . Emesis  . Abdominal Pain  . Weakness    HPI Andrea [name] is a 67 y.o. female.  HPI Complains of left lower quadrant abdominal pain, nonradiating onset 2 days ago accompanied by loose stools and blood on toilet paper when she wipes and several episodes of vomiting and dry heaves.  She reports vomiting coffee grounds this morning however the last time she vomited, emesis appeared white to her.  She denies fever.  She is been treating herself with her usual medications, without relief.  Nothing makes symptoms better or worse.  No known fever.  No other associated symptoms . brought by EMS Past Medical History:  Diagnosis Date  . Breast cancer ([place])    bilat mastectomy and LUE lymphadenectomy 2014, chemoRx 2014 - 15  . Diabetes mellitus without complication ([place])   . Esophageal reflux   . Hypertension   . Hypothyroid   . Migraine   . Seizure [facility])     Patient Active Problem List   Diagnosis Date Noted  . C. difficile colitis 03/17/2017  . AKI (acute kidney injury) ([place])   . Diverticulitis 02/17/2017  . Frequent falls 01/08/2017  . Rhabdomyolysis 01/08/2017  . Chronic pain 01/08/2017  . Nausea & vomiting 11/23/2016  . Diarrhea 11/23/2016  . UTI (urinary tract infection) 12/03/2013  . Fall 12/02/2013  . Hypertension   . Breast cancer ([place])   . Diabetes mellitus without complication ([place])   . Migraine   . Hypothyroid   . Epilepsy ([place])   . Esophageal reflux   . Multiple rib fractures 11/28/2013    Past Surgical History:  Procedure Laterality Date  . ABDOMINAL HYSTERECTOMY     1984 , done for ruptured cyst and endometriosis  . APPENDECTOMY    . CHOLECYSTECTOMY     1980's  . EYE SURGERY  08/28/2015   Right eye  . KNEE SURGERY Left   . MASTECTOMY     bilateral  .  Open surgery for bowel obstruction, 2000's       OB History   None      Home Medications    Prior to Admission medications   Medication Sig Start Date End Date Taking? Authorizing Provider  ALPRAZolam (XANAX) 0.5 MG tablet TAKE 1 TABLET BY MOUTH 4 TIMES A DAY AS NEEDED FOR ANXIETY 11/21/16  Yes [provider]  atenolol (TENORMIN) 100 MG tablet Take 50 mg by mouth 2 (two) times daily.   Yes [provider]  dicyclomine (BENTYL) 10 MG capsule TK ONE C PO TID [facility] 05/05/17  Yes [provider]  EMBEDA 20-0.8 MG CPCR Take 1 tablet by mouth daily. 10/24/16  Yes [provider]  LANTUS SOLOSTAR 100 UNIT/ML Solostar Pen INJECT 20 - 50 UNITS UNDER THE SKIN DAILY AS DIRECTED 03/20/17  Yes [provider]  levothyroxine (SYNTHROID, LEVOTHROID) 200 MCG tablet Take 1 tablet (200 mcg total) by mouth at bedtime. Patient taking differently: Take 200 mcg by mouth daily before breakfast.  01/12/17  Yes Lavina Hamman, MD  lisinopril (PRINIVIL,ZESTRIL) 10 MG tablet Take 10 mg by mouth at bedtime.    Yes [provider]  oxyCODONE-acetaminophen (PERCOCET) 10-325 MG tablet Take 1 tablet by mouth 3 (three) times daily as needed for pain. 02/03/17  Yes [provider]  pregabalin (  LYRICA) 75 MG capsule Take 1 capsule (75 mg total) 2 (two) times daily by mouth. 11/24/16  Yes Osei-Bonsu, Iona Beard, MD  promethazine (PHENERGAN) 25 MG tablet TK 1 T PO Q 6 H PRF NAUSEA 05/30/17  Yes [provider]  saccharomyces boulardii (FLORASTOR) 250 MG capsule Take 1 capsule (250 mg total) by mouth 2 (two) times daily. 02/24/17  Yes Patrecia Pour, MD    Family History Family History  Problem Relation Age of Onset  . Sudden Cardiac Death Neg Hx     Social History Social History   Tobacco Use  . Smoking status: Never Smoker  . Smokeless tobacco: Never Used  Substance Use Topics  . Alcohol use: No  . Drug use: No     Allergies   Aspirin; Maxzide  [hydrochlorothiazide w-triamterene]; Metformin and related; Nsaids; Other; Pravachol [pravastatin]; Stadol [butorphanol]; Toradol [ketorolac tromethamine]; Vistaril [hydroxyzine hcl]; Erythromycin; Morphine and related; and Penicillins   Review of Systems Review of Systems  Gastrointestinal: Positive for abdominal pain, diarrhea and vomiting.  Allergic/Immunologic: Positive for immunocompromised state.       Diabetic  Neurological: Positive for weakness.       Generalized weakness  All other systems reviewed and are negative.    Physical Exam Updated Vital Signs BP (!) 196/91   Pulse (!) 115   Temp 98.7 F (37.1 C) (Oral)   Resp 18   SpO2 96%   Physical Exam  Constitutional:  Ill-appearing  HENT:  Head: Normocephalic and atraumatic.  Mucus membranes dry  Eyes: Pupils are equal, round, and reactive to light. Conjunctivae are normal.  Neck: Neck supple. No tracheal deviation present. No thyromegaly present.  Cardiovascular: Regular rhythm.  No murmur heard. Tachycardic  Pulmonary/Chest: Effort normal and breath sounds normal.  Abdominal: Soft. Bowel sounds are normal. She exhibits no distension. There is tenderness. There is no guarding.  For umbilical surgical scar.  Left lower quadrant tenderness  Genitourinary:  Genitourinary Comments: Normal tone brown stool no gross  Musculoskeletal: Normal range of motion. She exhibits no edema or tenderness.  Neurological: She is alert. Coordination normal.  Skin: Skin is warm and dry. No rash noted.  Psychiatric: She has a normal mood and affect.  Nursing note and vitals reviewed.   2 PM nausea and pain are controlled after treatment with intravenous opioids and intravenous antiemetics.  4 PM requesting additional antiemetic and pain medicine.  IV fentanyl and IV Zofran ordered.  Patient lab work consistent with acute kidney injury and dehydration and hyperglycemia. ED Treatments / Results  Labs (all labs ordered are listed,  but only abnormal results are displayed) Labs Reviewed  URINALYSIS, ROUTINE W REFLEX MICROSCOPIC  CBC WITH DIFFERENTIAL/PLATELET  BASIC METABOLIC PANEL  POC OCCULT BLOOD, ED  POC OCCULT BLOOD, ED    EKG None  Radiology No results found.  Procedures Procedures (including critical care time)  Medications Ordered in ED Medications  sodium chloride 0.9 % bolus 1,000 mL (has no administration in time range)  fentaNYL (SUBLIMAZE) injection 100 mcg (has no administration in time range)  metoCLOPramide (REGLAN) injection 5 mg (has no administration in time range)     Initial Impression / Assessment and Plan / ED Course  I have reviewed the triage vital signs and the nursing notes.  Pertinent labs & imaging results that were available during my care of the patient were reviewed by me and considered in my medical decision making (see chart for details).     Patient's lab work consistent  with hyperglycemia, acute kidney injury and mildly elevated anion gap, representing mild  diabetic ketoacidosis.,  She is been treated with intravenous fluids and subcutaneous insulin.  I consulted Dr. Olevia Bowens who will arrange for admission  Final Clinical Impressions(s) / ED Diagnoses  Diagnosis #1 left lower quadrant abdominal pain #2 nausea vomiting diarrhea #3 diabetic ketoacidosis #4 dehydration #5 acute kidney injury CRITICAL CARE Performed by: Orlie Dakin Total critical care time: 40 minutes Critical care time was exclusive of separately billable procedures and treating other patients. Critical care was necessary to treat or prevent imminent or life-threatening deterioration. Critical care was time spent personally by me on the following activities: development of treatment plan with patient and/or surrogate as well as nursing, discussions with consultants, evaluation of patient's response to treatment, examination of patient, obtaining history from patient or surrogate, ordering and  performing treatments and interventions, ordering and review of laboratory studies, ordering and review of radiographic studies, pulse oximetry and re-evaluation of patient's condition. Final diagnoses:  None    ED Discharge Orders    None       Orlie Dakin, MD 06/01/17 1609

## 2017-06-02 ENCOUNTER — Inpatient Hospital Stay: Payer: Self-pay

## 2017-06-02 DIAGNOSIS — R109 Unspecified abdominal pain: Secondary | ICD-10-CM

## 2017-06-02 DIAGNOSIS — R1032 Left lower quadrant pain: Secondary | ICD-10-CM | POA: Diagnosis not present

## 2017-06-02 DIAGNOSIS — E119 Type 2 diabetes mellitus without complications: Secondary | ICD-10-CM | POA: Diagnosis not present

## 2017-06-02 DIAGNOSIS — N179 Acute kidney failure, unspecified: Secondary | ICD-10-CM | POA: Diagnosis not present

## 2017-06-02 DIAGNOSIS — E1165 Type 2 diabetes mellitus with hyperglycemia: Secondary | ICD-10-CM | POA: Diagnosis not present

## 2017-06-02 DIAGNOSIS — R739 Hyperglycemia, unspecified: Secondary | ICD-10-CM | POA: Diagnosis not present

## 2017-06-02 LAB — COMPREHENSIVE METABOLIC PANEL
ALK PHOS: 66 U/L (ref 38–126)
ALT: 27 U/L (ref 14–54)
AST: 15 U/L (ref 15–41)
Albumin: 3.2 g/dL — ABNORMAL LOW (ref 3.5–5.0)
Anion gap: 12 (ref 5–15)
BILIRUBIN TOTAL: 1.1 mg/dL (ref 0.3–1.2)
BUN: 38 mg/dL — ABNORMAL HIGH (ref 6–20)
CALCIUM: 8.8 mg/dL — AB (ref 8.9–10.3)
CO2: 27 mmol/L (ref 22–32)
CREATININE: 1.01 mg/dL — AB (ref 0.44–1.00)
Chloride: 99 mmol/L — ABNORMAL LOW (ref 101–111)
GFR, EST NON AFRICAN AMERICAN: 57 mL/min — AB (ref >60)
Glucose, Bld: 254 mg/dL — ABNORMAL HIGH (ref 65–99)
Potassium: 3.5 mmol/L (ref 3.5–5.1)
SODIUM: 138 mmol/L (ref 135–145)
TOTAL PROTEIN: 7.2 g/dL (ref 6.5–8.1)

## 2017-06-02 LAB — GLUCOSE, CAPILLARY
GLUCOSE-CAPILLARY: 216 mg/dL — AB (ref 65–99)
Glucose-Capillary: 148 mg/dL — ABNORMAL HIGH (ref 65–99)
Glucose-Capillary: 178 mg/dL — ABNORMAL HIGH (ref 65–99)
Glucose-Capillary: 166 mg/dL — ABNORMAL HIGH (ref 65–99)

## 2017-06-02 LAB — CBC
HCT: 38.1 % (ref 36.0–46.0)
HEMOGLOBIN: 13.7 g/dL (ref 12.0–15.0)
MCH: 30.6 pg (ref 26.0–34.0)
MCHC: 36 g/dL (ref 30.0–36.0)
MCV: 85.2 fL (ref 78.0–100.0)
Platelets: 334 10*3/uL (ref 150–400)
RBC: 4.47 MIL/uL (ref 3.87–5.11)
RDW: 12.4 % (ref 11.5–15.5)
WBC: 21 10*3/uL — ABNORMAL HIGH (ref 4.0–10.5)

## 2017-06-02 LAB — HEMOGLOBIN A1C
HEMOGLOBIN A1C: 7.3 % — AB (ref 4.8–5.6)
MEAN PLASMA GLUCOSE: 162.81 mg/dL

## 2017-06-02 MED ORDER — AMLODIPINE BESYLATE 10 MG PO TABS
10.0000 mg | ORAL_TABLET | Freq: Every day | ORAL | Status: DC
Start: 2017-06-02 — End: 2017-06-05
  Administered 2017-06-02 – 2017-06-05 (×4): 10 mg via ORAL
  Filled 2017-06-02 (×4): qty 1

## 2017-06-02 MED ORDER — METRONIDAZOLE IN NACL 5-0.79 MG/ML-% IV SOLN
500.0000 mg | Freq: Three times a day (TID) | INTRAVENOUS | Status: DC
  Administered 2017-06-02 – 2017-06-03 (×2): 500 mg via INTRAVENOUS
  Filled 2017-06-02 (×2): qty 100

## 2017-06-02 MED ORDER — INSULIN GLARGINE 100 UNIT/ML ~~LOC~~ SOLN
15.0000 [IU] | Freq: Two times a day (BID) | SUBCUTANEOUS | Status: DC
  Administered 2017-06-02 (×2): 15 [IU] via SUBCUTANEOUS
  Filled 2017-06-02 (×3): qty 0.15

## 2017-06-02 MED ORDER — SODIUM CHLORIDE 0.9% FLUSH: 10.0000 mL | INTRAVENOUS | Status: DC | PRN

## 2017-06-02 MED ORDER — CIPROFLOXACIN IN D5W 400 MG/200ML IV SOLN
400.0000 mg | Freq: Two times a day (BID) | INTRAVENOUS | Status: DC
  Administered 2017-06-02: 400 mg via INTRAVENOUS
  Filled 2017-06-02: qty 200

## 2017-06-02 MED ORDER — HYDRALAZINE HCL 20 MG/ML IJ SOLN: 10.0000 mg | Freq: Three times a day (TID) | INTRAMUSCULAR | Status: DC | PRN

## 2017-06-02 MED ORDER — HYDRALAZINE HCL 20 MG/ML IJ SOLN: 5.0000 mg | Freq: Three times a day (TID) | INTRAMUSCULAR | Status: DC | PRN

## 2017-06-02 NOTE — Progress Notes (Signed)
[facility] PROGRESS NOTE    Progress Note  Andrea [name]  FOY:[id number] DOB: [birth year] DOA: 06/01/2017 PCP: Medicine, [facility]     Brief Narrative:   Andrea [name] is an 67 y.o. female past medical history significant for C. difficile colitis in February 2018 with previous episode of acute diverticulitis comes into the hospital for nausea vomiting, diarrhea and abdominal pain  Assessment/Plan:   Unspecified abdominal pain/  Intractable nausea and vomiting/  Diarrhea: CT scan of the abdomen pelvis showed no acute evidence of perforation inflammation. She continues to have significant abdominal pain there continues to be arise in her leukocytosis. I will start her empirically on IV ciprofloxacin and Flagyl for possible infectious colitis. She has not had any further diarrhea since she has been in the hospital. He is tolerating her diet.  Diabetes mellitus without complication (HCC)/  Hyperglycemia Glucose is improving her A1c was 7.3, increase her Lantus continue sliding scale insulin.  AKI (acute kidney injury) ([place]) Improving with IV fluid hydration, likely prerenal in etiology.   DVT prophylaxis: lovenox Family Communication:none Disposition Plan/Barrier to D/C: Unable to determine Code Status:     Code Status Orders  (From admission, onward)        Start     Ordered   06/01/17 1736  Full code  Continuous     06/01/17 1736    Code Status History    Date Active Date Inactive Code Status Order ID Comments User Context   03/17/2017 1521 03/20/2017 2043 Full Code [id number]  Phillips Grout, MD ED   02/17/2017 1927 02/24/2017 1747 Full Code [id number]  Elwin Mocha, MD ED   01/08/2017 0354 01/12/2017 1833 Full Code [id number]  Vianne Bulls, MD ED   11/23/2016 0527 11/24/2016 2327 Full Code [id number]  Jani Gravel, MD Inpatient   11/28/2013 1828 12/06/2013 2058 Full Code [id number]  Coralie Keens, MD Inpatient        IV Access:     Peripheral IV   Procedures and diagnostic studies:   Ct Abdomen Pelvis W Contrast  Result Date: 06/01/2017 CLINICAL DATA:  History of diverticulosis and colitis.  Weakness. EXAM: CT ABDOMEN AND PELVIS WITH CONTRAST TECHNIQUE: Multidetector CT imaging of the abdomen and pelvis was performed using the standard protocol following bolus administration of intravenous contrast. CONTRAST:  26mL ISOVUE-300 IOPAMIDOL (ISOVUE-300) INJECTION 61% COMPARISON:  02/17/2017.  03/17/2017. FINDINGS: Lower chest: Normal Hepatobiliary: Mild diffuse fatty change of the liver. Previous cholecystectomy. Pancreas: Normal Spleen: Normal Adrenals/Urinary Tract: Adrenal glands are normal. Right kidney is normal except for a 1 cm cyst in the midportion. Left kidney shows a central pelvic location and contains a few small cysts. No obstruction or significant lesion. Stomach/Bowel: No evidence of inflammatory disease presently. No obstruction. No other focal finding. Vascular/Lymphatic: Aortic atherosclerosis. No aneurysm. IVC is normal. No retroperitoneal adenopathy. Reproductive: Previous hysterectomy.  No pelvic mass. Other: No free fluid or air. Musculoskeletal: Old superior endplate fractures at L3 and T9. IMPRESSION: No acute finding by CT. No evidence of bowel inflammation or obstruction presently. Electronically Signed   By: Nelson Chimes M.D.   On: 06/01/2017 14:06   [place] Ekg Site Rite  Result Date: 06/02/2017 If Site Rite image not attached, placement could not be confirmed due to current cardiac rhythm.    Medical Consultants:    None.  Anti-Infectives:   IV ciprofloxacin and Flagyl  Subjective:    Andrea [name] she relates continues to have abdominal pain which has not improved  denies any further diarrhea  Objective:    Vitals:   06/01/17 1632 06/01/17 1754 06/01/17 2339 06/02/17 0505  BP: 121/73 (!) 128/49 (!) 183/76 (!) 203/90  Pulse: (!) 125 (!) 127 (!) 126 88  Resp: 16 18 19 17     Temp:   98.7 F (37.1 C) 99.6 F (37.6 C)  TempSrc:   Oral Oral  SpO2: 96% 96% 96% 96%    Intake/Output Summary (Last 24 hours) at 06/02/2017 1020 Last data filed at 06/01/2017 1828 Gross per 24 hour  Intake 3060 ml  Output -  Net 3060 ml   There were no vitals filed for this visit.  Exam: General exam: In no acute distress. Respiratory system: Good air movement and clear to auscultation. Cardiovascular system: S1 & S2 heard, RRR.   Gastrointestinal system: Positive bowel sounds, soft nondistended continues to have left lower quadrant tenderness. Central nervous system: Alert and oriented. No focal neurological deficits. Extremities: No pedal edema. Skin: No rashes, lesions or ulcers Psychiatry: Judgement and insight appear normal. Mood & affect appropriate.    Data Reviewed:    Labs: Basic Metabolic Panel: Recent Labs  Lab 06/01/17 1108 06/02/17 0431  NA 135 138  K 3.8 3.5  CL 91* 99*  CO2 28 27  GLUCOSE 395* 254*  BUN 49* 38*  CREATININE 1.37* 1.01*  CALCIUM 9.3 8.8*   GFR CrCl cannot be calculated (Unknown ideal weight.). Liver Function Tests: Recent Labs  Lab 06/02/17 0431  AST 15  ALT 27  ALKPHOS 66  BILITOT 1.1  PROT 7.2  ALBUMIN 3.2*   No results for input(s): LIPASE, AMYLASE in the last 168 hours. No results for input(s): AMMONIA in the last 168 hours. Coagulation profile No results for input(s): INR, PROTIME in the last 168 hours.  CBC: Recent Labs  Lab 06/01/17 1108 06/02/17 0431  WBC 15.7* 21.0*  NEUTROABS 12.2*  --   HGB 15.1* 13.7  HCT 41.5 38.1  MCV 86.1 85.2  PLT 361 334   Cardiac Enzymes: No results for input(s): CKTOTAL, CKMB, CKMBINDEX, TROPONINI in the last 168 hours. BNP (last 3 results) No results for input(s): PROBNP in the last 8760 hours. CBG: Recent Labs  Lab 06/01/17 1631 06/01/17 1741 06/01/17 2332 06/02/17 0807  GLUCAP 274* 231* 301* 216*   D-Dimer: No results for input(s): DDIMER in the last 72  hours. Hgb A1c: Recent Labs    06/02/17 0431  HGBA1C 7.3*   Lipid Profile: No results for input(s): CHOL, HDL, LDLCALC, TRIG, CHOLHDL, LDLDIRECT in the last 72 hours. Thyroid function studies: No results for input(s): TSH, T4TOTAL, T3FREE, THYROIDAB in the last 72 hours.  Invalid input(s): FREET3 Anemia work up: No results for input(s): VITAMINB12, FOLATE, FERRITIN, TIBC, IRON, RETICCTPCT in the last 72 hours. Sepsis Labs: Recent Labs  Lab 06/01/17 1108 06/02/17 0431  WBC 15.7* 21.0*   Microbiology No results found for this or any previous visit (from the past 240 hour(s)).   Medications:   . amLODipine  10 mg Oral Daily  . atenolol  50 mg Oral BID  . fentaNYL  100 mcg Transdermal Q72H  . heparin  5,000 Units Subcutaneous Q8H  . insulin aspart  0-5 Units Subcutaneous QHS  . insulin aspart  0-9 Units Subcutaneous TID WC  . insulin aspart  3 Units Subcutaneous TID WC  . insulin glargine  25 Units Subcutaneous Q2200  . levothyroxine  200 mcg Oral QHS  . Morphine-Naltrexone  1 tablet Oral Daily  . pregabalin  75 mg Oral BID  . saccharomyces boulardii  250 mg Oral BID   Continuous Infusions: . sodium chloride 100 mL/hr at 06/01/17 1806     LOS: 1 day   [facility] Pager [phone]  *Please refer to [email or web address], password [email or web address] to get updated schedule on who will round on this patient, as hospitalists switch teams weekly. If 7PM-7AM, please contact night-coverage at [email or web address], password [email or web address] for any overnight needs.  06/02/2017, 10:20 AM

## 2017-06-02 NOTE — Progress Notes (Signed)
Peripherally Inserted Central Catheter/Midline Placement  The IV Nurse has discussed with the patient and/or persons authorized to consent for the patient, the purpose of this procedure and the potential benefits and risks involved with this procedure.  The benefits include less needle sticks, lab draws from the catheter, and the patient may be discharged home with the catheter. Risks include, but not limited to, infection, bleeding, blood clot (thrombus formation), and puncture of an artery; nerve damage and irregular heartbeat and possibility to perform a PICC exchange if needed/ordered by physician.  Alternatives to this procedure were also discussed.  Bard Power PICC patient education guide, fact sheet on infection prevention and patient information card has been provided to patient /or left at bedside.    PICC/Midline Placement Documentation  PICC Double Lumen 93/55/21 PICC Right Basilic 33 cm 0 cm (Active)  Indication for Insertion or Continuance of Line Prolonged intravenous therapies 06/02/2017  5:40 PM  Exposed Catheter (cm) 0 cm 06/02/2017  5:40 PM  Site Assessment Clean;Dry;Intact 06/02/2017  5:40 PM  Lumen #1 Status Flushed;Blood return noted;Saline locked 06/02/2017  5:40 PM  Lumen #2 Status Flushed;Blood return noted;Saline locked 06/02/2017  5:40 PM  Dressing Type Transparent 06/02/2017  5:40 PM  Dressing Status Clean;Dry;Intact 06/02/2017  5:40 PM  Dressing Intervention New dressing 06/02/2017  5:40 PM  Dressing Change Due 06/09/17 06/02/2017  5:40 PM       [address] 06/02/2017, 5:56 PM

## 2017-06-03 DIAGNOSIS — E1165 Type 2 diabetes mellitus with hyperglycemia: Secondary | ICD-10-CM | POA: Diagnosis not present

## 2017-06-03 DIAGNOSIS — R739 Hyperglycemia, unspecified: Secondary | ICD-10-CM | POA: Diagnosis not present

## 2017-06-03 DIAGNOSIS — R1032 Left lower quadrant pain: Secondary | ICD-10-CM | POA: Diagnosis not present

## 2017-06-03 DIAGNOSIS — E119 Type 2 diabetes mellitus without complications: Secondary | ICD-10-CM | POA: Diagnosis not present

## 2017-06-03 DIAGNOSIS — N179 Acute kidney failure, unspecified: Secondary | ICD-10-CM | POA: Diagnosis not present

## 2017-06-03 LAB — CBC WITH DIFFERENTIAL/PLATELET
BASOS PCT: 0 %
Basophils Absolute: 0 10*3/uL (ref 0.0–0.1)
EOS ABS: 0.1 10*3/uL (ref 0.0–0.7)
EOS PCT: 0 %
HCT: 37.6 % (ref 36.0–46.0)
Hemoglobin: 13.3 g/dL (ref 12.0–15.0)
LYMPHS ABS: 3.1 10*3/uL (ref 0.7–4.0)
Lymphocytes Relative: 18 %
MCH: 30.6 pg (ref 26.0–34.0)
MCHC: 35.4 g/dL (ref 30.0–36.0)
MCV: 86.6 fL (ref 78.0–100.0)
MONOS PCT: 7 %
Monocytes Absolute: 1.2 10*3/uL — ABNORMAL HIGH (ref 0.1–1.0)
NEUTROS PCT: 75 %
Neutro Abs: 12.6 10*3/uL — ABNORMAL HIGH (ref 1.7–7.7)
PLATELETS: 299 10*3/uL (ref 150–400)
RBC: 4.34 MIL/uL (ref 3.87–5.11)
RDW: 12.4 % (ref 11.5–15.5)
WBC: 17 10*3/uL — AB (ref 4.0–10.5)

## 2017-06-03 LAB — GLUCOSE, CAPILLARY
GLUCOSE-CAPILLARY: 201 mg/dL — AB (ref 65–99)
GLUCOSE-CAPILLARY: 201 mg/dL — AB (ref 65–99)
Glucose-Capillary: 130 mg/dL — ABNORMAL HIGH (ref 65–99)
Glucose-Capillary: 101 mg/dL — ABNORMAL HIGH (ref 65–99)

## 2017-06-03 MED ORDER — METRONIDAZOLE 500 MG PO TABS
500.0000 mg | ORAL_TABLET | Freq: Three times a day (TID) | ORAL | Status: DC
  Administered 2017-06-03 – 2017-06-05 (×7): 500 mg via ORAL
  Filled 2017-06-03 (×8): qty 1

## 2017-06-03 MED ORDER — INSULIN GLARGINE 100 UNIT/ML ~~LOC~~ SOLN
25.0000 [IU] | Freq: Two times a day (BID) | SUBCUTANEOUS | Status: DC
  Administered 2017-06-03 – 2017-06-05 (×5): 25 [IU] via SUBCUTANEOUS
  Filled 2017-06-03 (×6): qty 0.25

## 2017-06-03 MED ORDER — CIPROFLOXACIN HCL 500 MG PO TABS
500.0000 mg | ORAL_TABLET | Freq: Two times a day (BID) | ORAL | Status: DC
  Administered 2017-06-03 – 2017-06-05 (×5): 500 mg via ORAL
  Filled 2017-06-03 (×5): qty 1

## 2017-06-03 NOTE — Evaluation (Signed)
Physical Therapy Evaluation Patient Details Name: Andrea [name] MRN: [id number] DOB: 1950/09/23 Today's Date: 06/03/2017   History of Present Illness  67 yo female admitted with hyperglycemia, N/V/D, acute onset R UE weakness. Hx of DM, falls, chronic pain, epilepsy  Clinical Impression  On eval, pt required Min assist for mobility. She walked ~15 feet around room while holding on to IV pole and with support from therapist. Pt c/o dizziness and fatigue that limited ambulation distance. Pt presents with general weakness, decreased activity tolerance, and impaired gait and balance. Pt is also experiencing new R sided UE weakness. Placed order for OT consult. Will follow and progress activity as tolerated. Discussed d/c plan-pt is open to possible SNF placement. She has limited assistance available at home.     Follow Up Recommendations SNF    Equipment Recommendations  None recommended by PT    Recommendations for Other Services       Precautions / Restrictions Precautions Precautions: Fall Restrictions Weight Bearing Restrictions: No Other Position/Activity Restrictions: WBAT      Mobility  Bed Mobility Overal bed mobility: Needs Assistance Bed Mobility: Supine to Sit;Sit to Supine     Supine to sit: Min guard;HOB elevated Sit to supine: Min guard;HOB elevated   General bed mobility comments: close guard for safety  Transfers Overall transfer level: Needs assistance   Transfers: Sit to/from Stand Sit to Stand: Min assist         General transfer comment:  increased time. assist to stabilize.   Ambulation/Gait Ambulation/Gait assistance: Min assist Ambulation Distance (Feet): 15 Feet Assistive device: (Iv pole) Gait Pattern/deviations: Step-through pattern;Staggering right;Staggering left;Drifts right/left     General Gait Details: Assist to stabilize pt throughout distance. Very unsteady and at risk for falls. Pt fatigues very easily. She c/o dizziness  and needed to sit immediately.   Stairs            Wheelchair Mobility    Modified Rankin (Stroke Patients Only)       Balance Overall balance assessment: Needs assistance         Standing balance support: Single extremity supported Standing balance-Leahy Scale: Poor                               Pertinent Vitals/Pain Pain Assessment: Faces Faces Pain Scale: Hurts even more Pain Location: abdomen Pain Descriptors / Indicators: Discomfort;Aching;Sore Pain Intervention(s): Monitored during session;Repositioned    Home Living Family/patient expects to be discharged to:: Private residence Living Arrangements: Children                    Prior Function                 Hand Dominance        Extremity/Trunk Assessment   Upper Extremity Assessment Upper Extremity Assessment: RUE deficits/detail RUE Deficits / Details: hand grip-fair, elbow flex 3/5, elbow ext 3/5, shoulder flex 3/5, shoulder ext 3/5. jerky movement noted. Also pt had difficulty maintaining sustained (beyond a few seconds) contractions RUE Coordination: decreased fine motor;decreased gross motor    Lower Extremity Assessment Lower Extremity Assessment: Generalized weakness(peripheral neuropathy)    Cervical / Trunk Assessment Cervical / Trunk Assessment: Normal  Communication      Cognition Arousal/Alertness: Awake/alert Behavior During Therapy: WFL for tasks assessed/performed Overall Cognitive Status: Within Functional Limits for tasks assessed  General Comments      Exercises     Assessment/Plan    PT Assessment Patient needs continued PT services  PT Problem List Decreased strength;Decreased balance;Decreased mobility;Decreased activity tolerance;Decreased knowledge of use of DME;Pain;Decreased coordination       PT Treatment Interventions DME instruction;Gait training;Functional mobility  training;Therapeutic activities;Balance training;Patient/family education;Therapeutic exercise    PT Goals (Current goals can be found in the Care Plan section)  Acute Rehab PT Goals Patient Stated Goal: to feel better PT Goal Formulation: With patient Time For Goal Achievement: 06/17/17 Potential to Achieve Goals: Good    Frequency Min 3X/week   Barriers to discharge Decreased caregiver support children work    Co-evaluation               AM-PAC PT "6 Clicks" Daily Activity  Outcome Measure Difficulty turning over in bed (including adjusting bedclothes, sheets and blankets)?: A Little Difficulty moving from lying on back to sitting on the side of the bed? : A Little Difficulty sitting down on and standing up from a chair with arms (e.g., wheelchair, bedside commode, etc,.)?: Unable Help needed moving to and from a bed to chair (including a wheelchair)?: A Little Help needed walking in hospital room?: A Little Help needed climbing 3-5 steps with a railing? : A Lot 6 Click Score: 15    End of Session Equipment Utilized During Treatment: Gait belt Activity Tolerance: Patient limited by fatigue Patient left: in bed;with call bell/phone within reach   PT Visit Diagnosis: Muscle weakness (generalized) (M62.81);Difficulty in walking, not elsewhere classified (R26.2);Other abnormalities of gait and mobility (R26.89);History of falling (Z91.81)    Time: 1141-1200 PT Time Calculation (min) (ACUTE ONLY): 19 min   Charges:   PT Evaluation $PT Eval Moderate Complexity: 1 Mod     PT G Codes:          Weston Anna, MPT Pager: [phone]

## 2017-06-03 NOTE — Progress Notes (Signed)
[facility] PROGRESS NOTE    Progress Note  Andrea [name]  YHC:[id number] DOB: [birth year] DOA: 06/01/2017 PCP: Medicine, [facility]     Brief Narrative:   Andrea [name] is an 67 y.o. female past medical history significant for C. difficile colitis in February 2018 with previous episode of acute diverticulitis comes into the hospital for nausea vomiting, diarrhea and abdominal pain  Assessment/Plan:   Unspecified abdominal pain/  Intractable nausea and vomiting/  Diarrhea: CT scan of the abdomen pelvis showed no acute evidence of perforation inflammation. She relates her abdominal pain is improved she is tolerating her diet. Continue oral Cipro and Flagyl, empirically for possible colitis. She has not had any further diarrhea since she has been in the hospital. He is tolerating her diet.  CBC is pending.  Diabetes mellitus without complication (HCC)/  Hyperglycemia Glucose is improving her A1c was 7.3, increase her Lantus continue sliding scale insulin.  AKI (acute kidney injury) ([place]) Improved with IV fluid hydration, likely prerenal in etiology.  KVO IV fluids  New right upper extremity weakness: She relates this is been going on since 05/30/2017, due to her severe abdominal pain she did not mention it on admission. She relates she has been dropping things from her hands, will get an MRI of the brain as on physical exam she seems to have 4-5 on her right upper extremity, also on the differential could be hyperglycemia causing focal weaknesses. Get a PT consult.  DVT prophylaxis: lovenox Family Communication:none Disposition Plan/Barrier to D/C: Unable to determine Code Status:     Code Status Orders  (From admission, onward)        Start     Ordered   06/01/17 1736  Full code  Continuous     06/01/17 1736    Code Status History    Date Active Date Inactive Code Status Order ID Comments User Context   03/17/2017 1521 03/20/2017 2043  Full Code [id number]  Phillips Grout, MD ED   02/17/2017 1927 02/24/2017 1747 Full Code [id number]  Elwin Mocha, MD ED   01/08/2017 0354 01/12/2017 1833 Full Code [id number]  Vianne Bulls, MD ED   11/23/2016 0527 11/24/2016 2327 Full Code [id number]  Jani Gravel, MD Inpatient   11/28/2013 1828 12/06/2013 2058 Full Code [id number]  Coralie Keens, MD Inpatient        IV Access:    Peripheral IV   Procedures and diagnostic studies:   Ct Abdomen Pelvis W Contrast  Result Date: 06/01/2017 CLINICAL DATA:  History of diverticulosis and colitis.  Weakness. EXAM: CT ABDOMEN AND PELVIS WITH CONTRAST TECHNIQUE: Multidetector CT imaging of the abdomen and pelvis was performed using the standard protocol following bolus administration of intravenous contrast. CONTRAST:  68mL ISOVUE-300 IOPAMIDOL (ISOVUE-300) INJECTION 61% COMPARISON:  02/17/2017.  03/17/2017. FINDINGS: Lower chest: Normal Hepatobiliary: Mild diffuse fatty change of the liver. Previous cholecystectomy. Pancreas: Normal Spleen: Normal Adrenals/Urinary Tract: Adrenal glands are normal. Right kidney is normal except for a 1 cm cyst in the midportion. Left kidney shows a central pelvic location and contains a few small cysts. No obstruction or significant lesion. Stomach/Bowel: No evidence of inflammatory disease presently. No obstruction. No other focal finding. Vascular/Lymphatic: Aortic atherosclerosis. No aneurysm. IVC is normal. No retroperitoneal adenopathy. Reproductive: Previous hysterectomy.  No pelvic mass. Other: No free fluid or air. Musculoskeletal: Old superior endplate fractures at L3 and T9. IMPRESSION: No acute finding by CT. No evidence of bowel inflammation or obstruction presently. Electronically Signed  By: Nelson Chimes M.D.   On: 06/01/2017 14:06   [place] Ekg Site Rite  Result Date: 06/02/2017 If Site Rite image not attached, placement could not be confirmed due to current cardiac rhythm.    Medical Consultants:     None.  Anti-Infectives:   IV ciprofloxacin and Flagyl  Subjective:    Andrea [name] she relates her abdominal pain is improved she is tolerating her diet. Objective:    Vitals:   06/02/17 0505 06/02/17 1450 06/02/17 2109 06/03/17 0514  BP: (!) 203/90 (!) 173/82 (!) 155/80 (!) 146/65  Pulse: 88 79 79 78  Resp: 17 18 17 16   Temp: 99.6 F (37.6 C) 98.3 F (36.8 C) 99.4 F (37.4 C) 98.9 F (37.2 C)  TempSrc: Oral Oral Oral Oral  SpO2: 96% 96% 94% 96%    Intake/Output Summary (Last 24 hours) at 06/03/2017 0856 Last data filed at 06/02/2017 1809 Gross per 24 hour  Intake 850 ml  Output -  Net 850 ml   There were no vitals filed for this visit.  Exam: General exam: In no acute distress. Respiratory system: Good air movement and clear to auscultation. Cardiovascular system: S1 & S2 heard, RRR.   Gastrointestinal system: Positive bowel sounds, soft nondistended continues to have left lower quadrant tenderness. Central nervous system: Alert and oriented. No focal neurological deficits. Extremities: No pedal edema. Skin: No rashes, lesions or ulcers Psychiatry: Judgement and insight appear normal. Mood & affect appropriate.    Data Reviewed:    Labs: Basic Metabolic Panel: Recent Labs  Lab 06/01/17 1108 06/02/17 0431  NA 135 138  K 3.8 3.5  CL 91* 99*  CO2 28 27  GLUCOSE 395* 254*  BUN 49* 38*  CREATININE 1.37* 1.01*  CALCIUM 9.3 8.8*   GFR CrCl cannot be calculated (Unknown ideal weight.). Liver Function Tests: Recent Labs  Lab 06/02/17 0431  AST 15  ALT 27  ALKPHOS 66  BILITOT 1.1  PROT 7.2  ALBUMIN 3.2*   No results for input(s): LIPASE, AMYLASE in the last 168 hours. No results for input(s): AMMONIA in the last 168 hours. Coagulation profile No results for input(s): INR, PROTIME in the last 168 hours.  CBC: Recent Labs  Lab 06/01/17 1108 06/02/17 0431  WBC 15.7* 21.0*  NEUTROABS 12.2*  --   HGB 15.1* 13.7  HCT 41.5 38.1   MCV 86.1 85.2  PLT 361 334   Cardiac Enzymes: No results for input(s): CKTOTAL, CKMB, CKMBINDEX, TROPONINI in the last 168 hours. BNP (last 3 results) No results for input(s): PROBNP in the last 8760 hours. CBG: Recent Labs  Lab 06/02/17 0807 06/02/17 1218 06/02/17 1748 06/02/17 2114 06/03/17 0814  GLUCAP 216* 148* 178* 166* 201*   D-Dimer: No results for input(s): DDIMER in the last 72 hours. Hgb A1c: Recent Labs    06/02/17 0431  HGBA1C 7.3*   Lipid Profile: No results for input(s): CHOL, HDL, LDLCALC, TRIG, CHOLHDL, LDLDIRECT in the last 72 hours. Thyroid function studies: No results for input(s): TSH, T4TOTAL, T3FREE, THYROIDAB in the last 72 hours.  Invalid input(s): FREET3 Anemia work up: No results for input(s): VITAMINB12, FOLATE, FERRITIN, TIBC, IRON, RETICCTPCT in the last 72 hours. Sepsis Labs: Recent Labs  Lab 06/01/17 1108 06/02/17 0431  WBC 15.7* 21.0*   Microbiology No results found for this or any previous visit (from the past 240 hour(s)).   Medications:   . amLODipine  10 mg Oral Daily  . atenolol  50 mg Oral BID  .  fentaNYL  100 mcg Transdermal Q72H  . heparin  5,000 Units Subcutaneous Q8H  . insulin aspart  0-5 Units Subcutaneous QHS  . insulin aspart  0-9 Units Subcutaneous TID WC  . insulin aspart  3 Units Subcutaneous TID WC  . insulin glargine  15 Units Subcutaneous BID  . levothyroxine  200 mcg Oral QHS  . Morphine-Naltrexone  1 tablet Oral Daily  . pregabalin  75 mg Oral BID  . saccharomyces boulardii  250 mg Oral BID   Continuous Infusions: . ciprofloxacin Stopped (06/02/17 2024)  . metronidazole Stopped (06/03/17 0458)     LOS: 2 days   [facility] Pager [phone]  *Please refer to [email or web address], password [email or web address] to get updated schedule on who will round on this patient, as hospitalists switch teams weekly. If 7PM-7AM, please contact night-coverage at [email or web address], password [email or web address] for any overnight  needs.  06/03/2017, 8:56 AM

## 2017-06-03 NOTE — Clinical Social Work Note (Addendum)
Followed up with pt this morning for decision re: DC plan. She reports she discussed with son and DIL last night and they are planning for pt to come home at DC. Have had [facility] in the past for home health and she would like HHPT/RN again if appropriate. States her daughter-in-law's mother will stay with her during the day for the first few days after discharge to help with supervision- family at home with her in the evenings.  Sharren Bridge, MSW, LCSW Clinical Social Work 06/04/2017 [phone]    Clinical Social Work Assessment  Patient Details  Name: Andrea [name] MRN: [id number] Date of Birth: 03-19-1950  Date of referral:            No formal consult      Reason for consult:        Discharge planning           Permission sought to share information with:    Permission granted to share information::     Name::        Agency::     Relationship::     Contact Information:     Housing/Transportation Living arrangements for the past 2 months:   single family home Source of Information:   patient Patient Interpreter Needed:   none Criminal Activity/Legal Involvement Pertinent to Current Situation/Hospitalization:   n/a Significant Relationships:   son, daughter-in-law Lives with:   son, daughter-in-law Do you feel safe going back to the place where you live?   yes Need for family participation in patient care:   Yes- pt is own decision maker but wants to ask her family's input  Care giving concerns:  Pt admitted from home where she resides with her son and his family. At baseline reports she was ambulating alone. Moved in with son and daughter in law in January. States she will be moving to her own apartment the first week of June. Has family with her at night but is alone in home during the day.  Admitted for work up of nausea/vomiting- treated for possible colitis.   Social Worker assessment / plan:  CSW met with pt to discuss DC plan as SNF recommended per  therapy eval today. Pt was alert/oriented. Understands recommendation for SNF, states this has been recommended in the past "but I've always preferred home health- I've had good therapy with [facility]." Pt reports she has been "feeling weakness in her arms for about a week." Also needing more assistance to walk than at baseline.  Pt reports at this point she would want to plan to return home, however she agreed to discuss this with son and daughter-in-law when they visit this evening.  CSW explained to pt the SNF placement process and need for [facility] Medicare pre-authorization. Pt understanding- CSW will follow up with pt tomorrow for decision.  Plan: TBD- home with home health versus SNF for short term rehab.  Employment status:   retired Therapist, music, Programmer, applications PT Recommendations:   SNF Information / Referral to community resources:     Patient/Family's Response to care:  Pt appreciative of care   Patient/Family's Understanding of and Emotional Response to Diagnosis, Current Treatment, and Prognosis:  Pt shows good understanding of treatment and care received. States she is bothered by "arm weakness" and feeling as if she does not understand why this happened." Emotionally calm and adaptable.  Emotional Assessment Appearance:   appropriate to age Attitude/Demeanor/Rapport:   engaged Affect (typically observed):  calm, adaptable Orientation:   self, time, situation place Alcohol / Substance use:   n/a Psych involvement (Current and /or in the community):   none  Discharge Needs  Concerns to be addressed:   decision making, discharge planning Readmission within the last 30 days:   no Current discharge risk:   assessing Barriers to Discharge:   continued medical work up   [employer], LCSW 06/03/2017, 3:49 PM  [phone]

## 2017-06-04 ENCOUNTER — Encounter (HOSPITAL_COMMUNITY): Payer: Self-pay | Admitting: Radiology

## 2017-06-04 ENCOUNTER — Inpatient Hospital Stay (HOSPITAL_COMMUNITY): Payer: Medicare Other

## 2017-06-04 DIAGNOSIS — R112 Nausea with vomiting, unspecified: Secondary | ICD-10-CM | POA: Diagnosis not present

## 2017-06-04 DIAGNOSIS — R739 Hyperglycemia, unspecified: Secondary | ICD-10-CM | POA: Diagnosis not present

## 2017-06-04 DIAGNOSIS — E1165 Type 2 diabetes mellitus with hyperglycemia: Secondary | ICD-10-CM | POA: Diagnosis not present

## 2017-06-04 DIAGNOSIS — E119 Type 2 diabetes mellitus without complications: Secondary | ICD-10-CM | POA: Diagnosis not present

## 2017-06-04 DIAGNOSIS — R197 Diarrhea, unspecified: Secondary | ICD-10-CM | POA: Diagnosis not present

## 2017-06-04 DIAGNOSIS — E11649 Type 2 diabetes mellitus with hypoglycemia without coma: Secondary | ICD-10-CM | POA: Diagnosis not present

## 2017-06-04 DIAGNOSIS — K219 Gastro-esophageal reflux disease without esophagitis: Secondary | ICD-10-CM | POA: Diagnosis not present

## 2017-06-04 DIAGNOSIS — N179 Acute kidney failure, unspecified: Secondary | ICD-10-CM | POA: Diagnosis not present

## 2017-06-04 DIAGNOSIS — R1032 Left lower quadrant pain: Secondary | ICD-10-CM | POA: Diagnosis not present

## 2017-06-04 LAB — BASIC METABOLIC PANEL
Anion gap: 11 (ref 5–15)
BUN: 34 mg/dL — AB (ref 6–20)
CALCIUM: 8.5 mg/dL — AB (ref 8.9–10.3)
CO2: 28 mmol/L (ref 22–32)
CREATININE: 1.05 mg/dL — AB (ref 0.44–1.00)
Chloride: 99 mmol/L — ABNORMAL LOW (ref 101–111)
GFR calc Af Amer: >60 mL/min (ref >60)
GFR calc non Af Amer: 54 mL/min — ABNORMAL LOW (ref >60)
GLUCOSE: 73 mg/dL (ref 65–99)
Potassium: 3.2 mmol/L — ABNORMAL LOW (ref 3.5–5.1)
Sodium: 138 mmol/L (ref 135–145)

## 2017-06-04 LAB — GLUCOSE, CAPILLARY
GLUCOSE-CAPILLARY: 209 mg/dL — AB (ref 65–99)
Glucose-Capillary: 84 mg/dL (ref 65–99)
Glucose-Capillary: 133 mg/dL — ABNORMAL HIGH (ref 65–99)
Glucose-Capillary: 111 mg/dL — ABNORMAL HIGH (ref 65–99)

## 2017-06-04 MED ORDER — POTASSIUM CHLORIDE CRYS ER 20 MEQ PO TBCR
40.0000 meq | EXTENDED_RELEASE_TABLET | Freq: Two times a day (BID) | ORAL | Status: AC
  Administered 2017-06-04 (×2): 40 meq via ORAL
  Filled 2017-06-04 (×2): qty 2

## 2017-06-04 MED ORDER — MAGNESIUM SULFATE 2 GM/50ML IV SOLN
2.0000 g | Freq: Once | INTRAVENOUS | Status: AC
  Administered 2017-06-04: 2 g via INTRAVENOUS
  Filled 2017-06-04: qty 50

## 2017-06-04 NOTE — Progress Notes (Signed)
PT Cancellation Note  Patient Details Name: Andrea [name] MRN: [id number] DOB: July 31, 1950   Cancelled Treatment:    Reason Eval/Treat Not Completed: Pain limiting ability to participate(pt reports she's having pain and nausea and doesn't feel up to walking at present. Will check back tomorrow. )   Philomena Doheny 06/04/2017, 2:20 PM [phone]

## 2017-06-04 NOTE — Progress Notes (Signed)
PROGRESS NOTE  Andrea [name] GDJ:[id number] DOB: 10-25-1950 DOA: 06/01/2017 PCP: Medicine, [facility]  HPI/Recap of past 24 hours: Andrea [name] is an 67 y.o. female past medical history significant for C. difficile colitis in February 2018 with previous episode of acute diverticulitis comes into the hospital for nausea vomiting, diarrhea and abdominal pain  06/04/2017: Patient reports right Upper extremity weakness.  CT head with no contrast done with no acute intracranial changes.  Assessment/Plan: Active Problems:   Diabetes mellitus without complication (HCC)   Intractable nausea and vomiting   Diarrhea   AKI (acute kidney injury) ([place])   Hyperglycemia   Unspecified abdominal pain  Intractable abdominal pain, improving History of C. difficile Continue oral Cipro and oral Flagyl empirically Continue to monitor  Type 2 diabetes complicated by hypoglycemia Last A1c 7.3 Continue insulin sliding scale  Right upper extremity weakness Unclear etiology Denies neck pain CT head unremarkable for any acute intracranial finding  AKI, resolved Creatinine on presentation 1.37 Creatinine today 1.05 Avoid nephrotoxic agents/hypotension/dehydration  Physical debility PT recommended SNF    Code Status: Full code  Family Communication: None at bedside  Disposition Plan: SNF when clinically stable   Consultants:  None  Procedures:  None  Antimicrobials:  Oral Cipro, oral Flagyl  DVT prophylaxis: Subcu heparin   Objective: Vitals:   06/04/17 0941 06/04/17 1300 06/04/17 1350 06/04/17 1528  BP: (!) 144/69 127/75 127/75 (!) 137/56  Pulse: 77  72 76  Resp:   16   Temp:   98.9 F (37.2 C)   TempSrc:   Oral   SpO2: 97%  98% 96%    Intake/Output Summary (Last 24 hours) at 06/04/2017 1822 Last data filed at 06/04/2017 1300 Gross per 24 hour  Intake 480 ml  Output -  Net 480 ml   There were no vitals filed for this  visit.  Exam:  . General: 67 y.o. year-old female well developed well nourished in no acute distress.  Alert and oriented x3. . Cardiovascular: Regular rate and rhythm with no rubs or gallops.  No thyromegaly or JVD noted.   [name] Respiratory: Clear to auscultation with no wheezes or rales. Good inspiratory effort. . Abdomen: Soft nontender nondistended with normal bowel sounds x4 quadrants. . Musculoskeletal: 3 out of 5 strength in right upper extremity.  No lower extremity edema. 2/4 pulses in all 4 extremities. . Skin: No ulcerative lesions noted or rashes, . Psychiatry: Mood is appropriate for condition and setting   Data Reviewed: CBC: Recent Labs  Lab 06/01/17 1108 06/02/17 0431 06/03/17 0900  WBC 15.7* 21.0* 17.0*  NEUTROABS 12.2*  --  12.6*  HGB 15.1* 13.7 13.3  HCT 41.5 38.1 37.6  MCV 86.1 85.2 86.6  PLT 361 334 622   Basic Metabolic Panel: Recent Labs  Lab 06/01/17 1108 06/02/17 0431 06/04/17 0610  NA 135 138 138  K 3.8 3.5 3.2*  CL 91* 99* 99*  CO2 28 27 28   GLUCOSE 395* 254* 73  BUN 49* 38* 34*  CREATININE 1.37* 1.01* 1.05*  CALCIUM 9.3 8.8* 8.5*   GFR: CrCl cannot be calculated (Unknown ideal weight.). Liver Function Tests: Recent Labs  Lab 06/02/17 0431  AST 15  ALT 27  ALKPHOS 66  BILITOT 1.1  PROT 7.2  ALBUMIN 3.2*   No results for input(s): LIPASE, AMYLASE in the last 168 hours. No results for input(s): AMMONIA in the last 168 hours. Coagulation Profile: No results for input(s): INR, PROTIME in the last 168  hours. Cardiac Enzymes: No results for input(s): CKTOTAL, CKMB, CKMBINDEX, TROPONINI in the last 168 hours. BNP (last 3 results) No results for input(s): PROBNP in the last 8760 hours. HbA1C: Recent Labs    06/02/17 0431  HGBA1C 7.3*   CBG: Recent Labs  Lab 06/03/17 1709 06/03/17 2027 06/04/17 0735 06/04/17 1217 06/04/17 1750  GLUCAP 130* 101* 84 133* 209*   Lipid Profile: No results for input(s): CHOL, HDL, LDLCALC,  TRIG, CHOLHDL, LDLDIRECT in the last 72 hours. Thyroid Function Tests: No results for input(s): TSH, T4TOTAL, FREET4, T3FREE, THYROIDAB in the last 72 hours. Anemia Panel: No results for input(s): VITAMINB12, FOLATE, FERRITIN, TIBC, IRON, RETICCTPCT in the last 72 hours. Urine analysis:    Component Value Date/Time   COLORURINE YELLOW 06/01/2017 1108   APPEARANCEUR HAZY (A) 06/01/2017 1108   LABSPEC 1.028 06/01/2017 1108   PHURINE 5.0 06/01/2017 1108   GLUCOSEU >=500 (A) 06/01/2017 1108   HGBUR MODERATE (A) 06/01/2017 1108   BILIRUBINUR NEGATIVE 06/01/2017 1108   KETONESUR 5 (A) 06/01/2017 1108   PROTEINUR >=300 (A) 06/01/2017 1108   UROBILINOGEN 1.0 12/02/2013 1612   NITRITE NEGATIVE 06/01/2017 1108   LEUKOCYTESUR NEGATIVE 06/01/2017 1108   Sepsis Labs: @LABRCNTIP (procalcitonin:4,lacticidven:4)  )No results found for this or any previous visit (from the past 240 hour(s)).    Studies: Ct Head Wo Contrast  Result Date: 06/04/2017 CLINICAL DATA:  Right hand numbness following fall, initial encounter EXAM: CT HEAD WITHOUT CONTRAST TECHNIQUE: Contiguous axial images were obtained from the base of the skull through the vertex without intravenous contrast. COMPARISON:  01/08/2017 FINDINGS: Brain: Scattered atrophic and ischemic changes are identified. A few prior lacunar infarcts are noted particularly in the basal ganglia on the right and the head of the caudate nucleus on the left. These are stable in appearance from the prior exam. No findings to suggest acute hemorrhage or acute infarct are seen. Vascular: No hyperdense vessel or unexpected calcification. Skull: Normal. Negative for fracture or focal lesion. Sinuses/Orbits: No acute finding. Other: None. IMPRESSION: Chronic atrophic and ischemic changes without acute abnormality. Electronically Signed   By: Inez Catalina M.D.   On: 06/04/2017 12:07    Scheduled Meds: . amLODipine  10 mg Oral Daily  . atenolol  50 mg Oral BID  .  ciprofloxacin  500 mg Oral BID  . fentaNYL  100 mcg Transdermal Q72H  . heparin  5,000 Units Subcutaneous Q8H  . insulin aspart  0-5 Units Subcutaneous QHS  . insulin aspart  0-9 Units Subcutaneous TID WC  . insulin aspart  3 Units Subcutaneous TID WC  . insulin glargine  25 Units Subcutaneous BID  . levothyroxine  200 mcg Oral QHS  . metroNIDAZOLE  500 mg Oral Q8H  . Morphine-Naltrexone  1 tablet Oral Daily  . potassium chloride  40 mEq Oral BID  . pregabalin  75 mg Oral BID  . saccharomyces boulardii  250 mg Oral BID    Continuous Infusions:   LOS: 3 days     Kayleen Memos, MD [facility] Pager [phone]  If 7PM-7AM, please contact night-coverage [email or web address] Password [facility] 06/04/2017, 6:22 PM

## 2017-06-04 NOTE — Evaluation (Signed)
Occupational Therapy Evaluation Patient Details Name: Andrea [name] MRN: [id number] DOB: 1950-10-29 Today's Date: 06/04/2017    History of Present Illness 67 yo female admitted with hyperglycemia, N/V/D, acute onset R UE weakness. Hx of DM, falls, chronic pain, epilepsy   Clinical Impression   Pt was admitted for the above.  At baseline, she is independent/mod I.  She is alone during the day; lives with son and daughter in [place].  Pt currently needs min A for adls; she has postural instability during standing tasks.  She does have RUE weakness (3+/5) and jerky quality to movement at times, but she can use bil UEs functionally with set up for adls. Will follow in acute setting with min guard level goals    Follow Up Recommendations  SNF(vs 24/7 with HHOT)    Equipment Recommendations  3 in 1 bedside commode    Recommendations for Other Services       Precautions / Restrictions Precautions Precautions: Fall Precaution Comments: jerky movements Restrictions Other Position/Activity Restrictions: WBAT      Mobility Bed Mobility         Supine to sit: Min guard;HOB elevated Sit to supine: Min guard;HOB elevated      Transfers   Equipment used: (bedrail)   Sit to Stand: Min assist         General transfer comment: pt with postural instability/wobbling    Balance           Standing balance support: Single extremity supported Standing balance-Leahy Scale: Poor                             ADL either performed or assessed with clinical judgement   ADL Overall ADL's : Needs assistance/impaired Eating/Feeding: Independent   Grooming: Set up;Sitting   Upper Body Bathing: Set up;Sitting   Lower Body Bathing: Minimal assistance;Sit to/from stand   Upper Body Dressing : Set up;Sitting   Lower Body Dressing: Minimal assistance                 General ADL Comments: pt with postural instability standing with only one hand stabilizing      Vision         Perception     Praxis      Pertinent Vitals/Pain Pain Assessment: Faces Faces Pain Scale: Hurts little more Pain Location: left lower quadrant     Hand Dominance Right   Extremity/Trunk Assessment Upper Extremity Assessment Upper Extremity Assessment: Generalized weakness, 3+/5 RUE Deficits / Details: jerky movement.  Pt able to self feed, open containers, and don sock           Communication Communication Communication: No difficulties   Cognition Arousal/Alertness: Awake/alert Behavior During Therapy: WFL for tasks assessed/performed Overall Cognitive Status: Within Functional Limits for tasks assessed                                     General Comments       Exercises     Shoulder Instructions      Home Living Family/patient expects to be discharged to:: Private residence Living Arrangements: [place] Shower/Tub: Teacher, early years/pre: [place]: Environmental consultant - 2 wheels   Additional Comments: does not have 24/7  Prior Functioning/Environment Level of Independence: Independent with assistive device(s)                 OT Problem List: Decreased strength;Impaired balance (sitting and/or standing);Decreased activity tolerance;Decreased knowledge of use of DME or AE;Pain      OT Treatment/Interventions: Self-care/ADL training;DME and/or AE instruction;Balance training;Patient/family education;Therapeutic activities;Energy conservation    OT Goals(Current goals can be found in the care plan section) Acute Rehab OT Goals Patient Stated Goal: to feel better OT Goal Formulation: With patient Time For Goal Achievement: 06/18/17 Potential to Achieve Goals: Good ADL Goals Pt Will Perform Lower Body Bathing: with min guard assist;sit to/from stand Pt Will Perform Lower Body Dressing: with min guard assist;sit to/from stand Pt Will Transfer to Toilet:  with min guard assist;stand pivot transfer;ambulating;bedside commode Pt Will Perform Toileting - Clothing Manipulation and hygiene: with min guard assist;sit to/from stand  OT Frequency: Min 2X/week   Barriers to D/C:            Co-evaluation              AM-PAC PT "6 Clicks" Daily Activity     Outcome Measure Help from another person eating meals?: None Help from another person taking care of personal grooming?: A Little Help from another person toileting, which includes using toliet, bedpan, or urinal?: A Little Help from another person bathing (including washing, rinsing, drying)?: A Little Help from another person to put on and taking off regular upper body clothing?: A Little Help from another person to put on and taking off regular lower body clothing?: A Lot 6 Click Score: 18   End of Session    Activity Tolerance: Patient limited by fatigue Patient left: in bed;with call bell/phone within reach;with bed alarm set  OT Visit Diagnosis: Unsteadiness on feet (R26.81);Muscle weakness (generalized) (M62.81)                Time: 2595-6387 OT Time Calculation (min): 17 min Charges:  OT General Charges $OT Visit: 1 Visit OT Evaluation $OT Eval Low Complexity: 1 Low G-Codes:     Lesle Chris, OTR/L [phone] 06/04/2017  Zameria Vogl 06/04/2017, 12:49 PM

## 2017-06-04 NOTE — Care Management Note (Signed)
Case Management Note  Patient Details  Name: Andrea [name] MRN: [id number] Date of Birth: [birth year]  Subjective/Objective:    67 yo admitted with Hyperglycemia.                Action/Plan: From home with family. Pt states she does not want to go to SNF as PT recommended. She wants to go home with home health services. Pt states she has used [facility] in the past and would like to use them again. [facility] rep alerted of referral. Pt states she has a RW and 3in1 at home currently and doesn't need any additional equipment.  Expected Discharge Date:                  Expected Discharge Plan:  [place]  In-House Referral:     Discharge planning Services  CM Consult  Post Acute Care Choice:  Home Health Choice offered to:  Patient  DME Arranged:    DME Agency:     HH Arranged:  RN, PT, OT, Nurse's Aide [place] Agency:  [place]  Status of Service:  In process, will continue to follow  If discussed at Long Length of Stay Meetings, dates discussed:    Additional CommentsLynnell Catalan, RN 06/04/2017, 10:37 AM  [phone]

## 2017-06-04 NOTE — Care Management Important Message (Signed)
Important Message  Patient Details  Name: Andrea [name] MRN: [id number] Date of Birth: September 25, 1950   Medicare Important Message Given:  Yes    Kerin Salen 06/04/2017, 10:35 AMImportant Message  Patient Details  Name: Andrea [name] MRN: [id number] Date of Birth: 1950/09/29   Medicare Important Message Given:  Yes    Kerin Salen 06/04/2017, 10:35 AM

## 2017-06-04 NOTE — Plan of Care (Signed)
  Problem: Nutrition: Goal: Adequate nutrition will be maintained Outcome: Progressing   Problem: Elimination: Goal: Will not experience complications related to bowel motility Outcome: Progressing   Problem: Safety: Goal: Ability to remain free from injury will improve Outcome: Progressing   

## 2017-06-04 NOTE — Progress Notes (Signed)
06/04/17  1855  Floor mats received from [employer]. Floor mats in room.

## 2017-06-05 DIAGNOSIS — R197 Diarrhea, unspecified: Secondary | ICD-10-CM | POA: Diagnosis not present

## 2017-06-05 DIAGNOSIS — N179 Acute kidney failure, unspecified: Secondary | ICD-10-CM | POA: Diagnosis not present

## 2017-06-05 DIAGNOSIS — E119 Type 2 diabetes mellitus without complications: Secondary | ICD-10-CM | POA: Diagnosis not present

## 2017-06-05 DIAGNOSIS — R1032 Left lower quadrant pain: Secondary | ICD-10-CM | POA: Diagnosis not present

## 2017-06-05 DIAGNOSIS — E1165 Type 2 diabetes mellitus with hyperglycemia: Secondary | ICD-10-CM | POA: Diagnosis not present

## 2017-06-05 LAB — BASIC METABOLIC PANEL
ANION GAP: 8 (ref 5–15)
BUN: 21 mg/dL — ABNORMAL HIGH (ref 6–20)
CHLORIDE: 103 mmol/L (ref 101–111)
CO2: 26 mmol/L (ref 22–32)
CREATININE: 0.88 mg/dL (ref 0.44–1.00)
Calcium: 8.3 mg/dL — ABNORMAL LOW (ref 8.9–10.3)
GFR calc non Af Amer: >60 mL/min (ref >60)
GLUCOSE: 85 mg/dL (ref 65–99)
Potassium: 3.8 mmol/L (ref 3.5–5.1)
Sodium: 137 mmol/L (ref 135–145)

## 2017-06-05 LAB — CBC
HCT: 33.2 % — ABNORMAL LOW (ref 36.0–46.0)
HEMOGLOBIN: 11.8 g/dL — AB (ref 12.0–15.0)
MCH: 30.6 pg (ref 26.0–34.0)
MCHC: 35.5 g/dL (ref 30.0–36.0)
MCV: 86 fL (ref 78.0–100.0)
Platelets: 249 10*3/uL (ref 150–400)
RBC: 3.86 MIL/uL — AB (ref 3.87–5.11)
RDW: 12.5 % (ref 11.5–15.5)
WBC: 11.4 10*3/uL — ABNORMAL HIGH (ref 4.0–10.5)

## 2017-06-05 LAB — GLUCOSE, CAPILLARY
GLUCOSE-CAPILLARY: 153 mg/dL — AB (ref 65–99)
GLUCOSE-CAPILLARY: 130 mg/dL — AB (ref 65–99)
Glucose-Capillary: 89 mg/dL (ref 65–99)

## 2017-06-05 MED ORDER — CIPROFLOXACIN HCL 500 MG PO TABS: 500.0000 mg | ORAL_TABLET | Freq: Two times a day (BID) | ORAL | 0 refills | Status: DC

## 2017-06-05 MED ORDER — AMLODIPINE BESYLATE 10 MG PO TABS: 10.0000 mg | ORAL_TABLET | Freq: Every day | ORAL | 0 refills | Status: DC

## 2017-06-05 MED ORDER — METRONIDAZOLE 500 MG PO TABS: 500.0000 mg | ORAL_TABLET | Freq: Three times a day (TID) | ORAL | 0 refills | Status: DC

## 2017-06-05 NOTE — Discharge Summary (Addendum)
Discharge Summary  Andrea [name] RSW:[id number] DOB: September 08, 1950  PCP: [facility], [place] date: 06/01/2017 Discharge date: 06/05/2017  Time spent: 25 minutes  Recommendations for Outpatient Follow-up:  1. Follow up with PCP 2. Follow up with GI 3. Take your medications as prescribed 4. Fall precautions  Discharge Diagnoses:  Active Hospital Problems   Diagnosis Date Noted  . Unspecified abdominal pain 06/02/2017  . Hyperglycemia 06/01/2017  . AKI (acute kidney injury) ([place])   . Intractable nausea and vomiting 11/23/2016  . Diarrhea 11/23/2016  . Diabetes mellitus without complication [facility])     Resolved Hospital Problems  No resolved problems to display.    Discharge Condition: Stable  Diet recommendation: Resume previous diet  Vitals:   06/04/17 2016 06/05/17 0524  BP: (!) 139/57 (!) 152/77  Pulse: 76 70  Resp: 15 16  Temp: 98.1 F (36.7 C) 98.4 F (36.9 C)  SpO2: 93% 100%    History of present illness:  Andrea [name] an 67 y.o.femalepast medical history significant for C. difficile colitis in February 2018 with previous episode of acute diverticulitis comes into the hospital for nausea vomiting, diarrhea and abdominal pain  06/04/2017: Patient reports right Upper extremity weakness.  CT head with no contrast done with no acute intracranial changes.  06/05/17: No new complaints.   On the day of discharge, the patient was hemodynamically stable.  Hospital Course:  Active Problems:   Diabetes mellitus without complication (HCC)   Intractable nausea and vomiting   Diarrhea   AKI (acute kidney injury) ([place])   Hyperglycemia   Unspecified abdominal pain  Intractable abdominal pain, improving CT abd/pelvis no acute finding. No evidence of bowel inflammation or obstruction History of C. difficile Continue oral Cipro and oral Flagyl empirically Follow up with GI post hospitalization  Type 2 diabetes complicated by  hypoglycemia Last A1c 7.3 Continue home meds  Right upper extremity weakness, improving Unclear etiology Denies neck pain CT head unremarkable for any acute intracranial finding  AKI, resolved Avoid nephrotoxic agents/hypotension/dehydration  Physical debility PT recommended SNF Patient states she will have someone at home with her at all times Dc with Home PT and RN    Procedures:  None  Consultations:  None  Discharge Exam: BP (!) 152/77 (BP Location: Right Leg)   Pulse 70   Temp 98.4 F (36.9 C) (Oral)   Resp 16   SpO2 100%  . General: 67 y.o. year-old female well developed well nourished in no acute distress.  Alert and oriented x3. . Cardiovascular: Regular rate and rhythm with no rubs or gallops.  No thyromegaly or JVD noted.   [name] Respiratory: Clear to auscultation with no wheezes or rales. Good inspiratory effort. . Abdomen: Soft nontender nondistended with normal bowel sounds x4 quadrants. . Musculoskeletal: No lower extremity edema. 2/4 pulses in all 4 extremities. . Skin: No ulcerative lesions noted or rashes, . Psychiatry: Mood is appropriate for condition and setting  Discharge Instructions You were cared for by a hospitalist during your hospital stay. If you have any questions about your discharge medications or the care you received while you were in the hospital after you are discharged, you can call the unit and asked to speak with the hospitalist on call if the hospitalist that took care of you is not available. Once you are discharged, your primary care physician will handle any further medical issues. Please note that NO REFILLS for any discharge medications will be authorized once you are discharged, as it  is imperative that you return to your primary care physician (or establish a relationship with a primary care physician if you do not have one) for your aftercare needs so that they can reassess your need for medications and monitor your lab  values.  Discharge Instructions    DME Bedside commode   Complete by:  As directed    3 in 1 bedside commode   Patient needs a bedside commode to treat with the following condition:  Ambulatory dysfunction     Allergies as of 06/05/2017      Reactions   Aspirin Nausea And Vomiting   Maxzide [hydrochlorothiazide W-triamterene] Swelling   Fluid retention   Metformin And Related Diarrhea   Nsaids Nausea And Vomiting   Other Other (See Comments)   All steroids produce psychosis per pt Artificial sweeteners produce nausea and upset stomach.   Pravachol [pravastatin] Other (See Comments)   unknown   Stadol [butorphanol] Other (See Comments)   Toradol, etc And related- hallucinations   Toradol [ketorolac Tromethamine] Other (See Comments)   hallucinations   Vistaril [hydroxyzine Hcl] Other (See Comments)   unknown   Erythromycin Itching, Rash   Morphine And Related Hives, Rash   Iv morphine.   Penicillins Hives, Itching, Rash   Has patient had a PCN reaction causing immediate rash, facial/tongue/throat swelling, SOB or lightheadedness with hypotension: yes Has patient had a PCN reaction causing severe rash involving mucus membranes or skin necrosis: unknown Has patient had a PCN reaction that required hospitalization : unknown Has patient had a PCN reaction occurring within the last 10 years: no If all of the above answers are "NO", then may proceed with Cephalosporin use.      Medication List    TAKE these medications   ALPRAZolam 0.5 MG tablet Commonly known as:  XANAX TAKE 1 TABLET BY MOUTH 4 TIMES A DAY AS NEEDED FOR ANXIETY   amLODipine 10 MG tablet Commonly known as:  NORVASC Take 1 tablet (10 mg total) by mouth daily. Start taking on:  06/06/2017   atenolol 100 MG tablet Commonly known as:  TENORMIN Take 50 mg by mouth 2 (two) times daily.   ciprofloxacin 500 MG tablet Commonly known as:  CIPRO Take 1 tablet (500 mg total) by mouth 2 (two) times daily.    dicyclomine 10 MG capsule Commonly known as:  BENTYL TK ONE C PO TID AC   EMBEDA 20-0.8 MG Cpcr Generic drug:  Morphine-Naltrexone Take 1 tablet by mouth daily.   LANTUS SOLOSTAR 100 UNIT/ML Solostar Pen Generic drug:  Insulin Glargine INJECT 20 - 50 UNITS UNDER THE SKIN DAILY AS DIRECTED   levothyroxine 200 MCG tablet Commonly known as:  SYNTHROID, LEVOTHROID Take 1 tablet (200 mcg total) by mouth at bedtime. What changed:  when to take this   lisinopril 10 MG tablet Commonly known as:  PRINIVIL,ZESTRIL Take 10 mg by mouth at bedtime.   metroNIDAZOLE 500 MG tablet Commonly known as:  FLAGYL Take 1 tablet (500 mg total) by mouth 3 (three) times daily.   oxyCODONE-acetaminophen 10-325 MG tablet Commonly known as:  PERCOCET Take 1 tablet by mouth 3 (three) times daily as needed for pain.   pregabalin 75 MG capsule Commonly known as:  LYRICA Take 1 capsule (75 mg total) 2 (two) times daily by mouth.   promethazine 25 MG tablet Commonly known as:  PHENERGAN TK 1 T PO Q 6 H PRF NAUSEA   saccharomyces boulardii 250 MG capsule Commonly known as:  [employer]  Take 1 capsule (250 mg total) by mouth 2 (two) times daily.            Durable Medical Equipment  (From admission, onward)        Start     Ordered   06/05/17 0000  DME Bedside commode    Comments:  3 in 1 bedside commode  Question:  Patient needs a bedside commode to treat with the following condition  Answer:  Ambulatory dysfunction   06/05/17 1313     Allergies  Allergen Reactions  . Aspirin Nausea And Vomiting  . Maxzide [Hydrochlorothiazide W-Triamterene] Swelling    Fluid retention  . Metformin And Related Diarrhea  . Nsaids Nausea And Vomiting  . Other Other (See Comments)    All steroids produce psychosis per pt Artificial sweeteners produce nausea and upset stomach.  Gregary Cromer [Pravastatin] Other (See Comments)    unknown  . Stadol [Butorphanol] Other (See Comments)    Toradol, etc And  related- hallucinations  . Toradol [Ketorolac Tromethamine] Other (See Comments)    hallucinations  . Vistaril [Hydroxyzine Hcl] Other (See Comments)    unknown  . Erythromycin Itching and Rash  . Morphine And Related Hives and Rash    Iv morphine.  . Penicillins Hives, Itching and Rash    Has patient had a PCN reaction causing immediate rash, facial/tongue/throat swelling, SOB or lightheadedness with hypotension: yes Has patient had a PCN reaction causing severe rash involving mucus membranes or skin necrosis: unknown Has patient had a PCN reaction that required hospitalization : unknown Has patient had a PCN reaction occurring within the last 10 years: no If all of the above answers are "NO", then may proceed with Cephalosporin use.    Follow-up Information    Medicine, [facility] Follow up in 2 day(s).   Specialty:  Family Medicine Why:  Call for appointment  Contact information: [place] [id number] [phone]            The results of significant diagnostics from this hospitalization (including imaging, microbiology, ancillary and laboratory) are listed below for reference.    Significant Diagnostic Studies: Ct Head Wo Contrast  Result Date: 06/04/2017 CLINICAL DATA:  Right hand numbness following fall, initial encounter EXAM: CT HEAD WITHOUT CONTRAST TECHNIQUE: Contiguous axial images were obtained from the base of the skull through the vertex without intravenous contrast. COMPARISON:  01/08/2017 FINDINGS: Brain: Scattered atrophic and ischemic changes are identified. A few prior lacunar infarcts are noted particularly in the basal ganglia on the right and the head of the caudate nucleus on the left. These are stable in appearance from the prior exam. No findings to suggest acute hemorrhage or acute infarct are seen. Vascular: No hyperdense vessel or unexpected calcification. Skull: Normal. Negative for fracture or focal lesion.  Sinuses/Orbits: No acute finding. Other: None. IMPRESSION: Chronic atrophic and ischemic changes without acute abnormality. Electronically Signed   By: Inez Catalina M.D.   On: 06/04/2017 12:07   Ct Abdomen Pelvis W Contrast  Result Date: 06/01/2017 CLINICAL DATA:  History of diverticulosis and colitis.  Weakness. EXAM: CT ABDOMEN AND PELVIS WITH CONTRAST TECHNIQUE: Multidetector CT imaging of the abdomen and pelvis was performed using the standard protocol following bolus administration of intravenous contrast. CONTRAST:  77mL ISOVUE-300 IOPAMIDOL (ISOVUE-300) INJECTION 61% COMPARISON:  02/17/2017.  03/17/2017. FINDINGS: Lower chest: Normal Hepatobiliary: Mild diffuse fatty change of the liver. Previous cholecystectomy. Pancreas: Normal Spleen: Normal Adrenals/Urinary Tract: Adrenal glands are normal.  Right kidney is normal except for a 1 cm cyst in the midportion. Left kidney shows a central pelvic location and contains a few small cysts. No obstruction or significant lesion. Stomach/Bowel: No evidence of inflammatory disease presently. No obstruction. No other focal finding. Vascular/Lymphatic: Aortic atherosclerosis. No aneurysm. IVC is normal. No retroperitoneal adenopathy. Reproductive: Previous hysterectomy.  No pelvic mass. Other: No free fluid or air. Musculoskeletal: Old superior endplate fractures at L3 and T9. IMPRESSION: No acute finding by CT. No evidence of bowel inflammation or obstruction presently. Electronically Signed   By: Nelson Chimes M.D.   On: 06/01/2017 14:06   [place] Ekg Site Rite  Result Date: 06/02/2017 If Site Rite image not attached, placement could not be confirmed due to current cardiac rhythm.   Microbiology: No results found for this or any previous visit (from the past 240 hour(s)).   Labs: Basic Metabolic Panel: Recent Labs  Lab 06/01/17 1108 06/02/17 0431 06/04/17 0610 06/05/17 0615  NA 135 138 138 137  K 3.8 3.5 3.2* 3.8  CL 91* 99* 99* 103  CO2 28 27 28 26     GLUCOSE 395* 254* 73 85  BUN 49* 38* 34* 21*  CREATININE 1.37* 1.01* 1.05* 0.88  CALCIUM 9.3 8.8* 8.5* 8.3*   Liver Function Tests: Recent Labs  Lab 06/02/17 0431  AST 15  ALT 27  ALKPHOS 66  BILITOT 1.1  PROT 7.2  ALBUMIN 3.2*   No results for input(s): LIPASE, AMYLASE in the last 168 hours. No results for input(s): AMMONIA in the last 168 hours. CBC: Recent Labs  Lab 06/01/17 1108 06/02/17 0431 06/03/17 0900 06/05/17 0615  WBC 15.7* 21.0* 17.0* 11.4*  NEUTROABS 12.2*  --  12.6*  --   HGB 15.1* 13.7 13.3 11.8*  HCT 41.5 38.1 37.6 33.2*  MCV 86.1 85.2 86.6 86.0  PLT 361 334 299 249   Cardiac Enzymes: No results for input(s): CKTOTAL, CKMB, CKMBINDEX, TROPONINI in the last 168 hours. BNP: BNP (last 3 results) No results for input(s): BNP in the last 8760 hours.  ProBNP (last 3 results) No results for input(s): PROBNP in the last 8760 hours.  CBG: Recent Labs  Lab 06/04/17 1217 06/04/17 1750 06/04/17 2049 06/05/17 0735 06/05/17 1141  GLUCAP 133* 209* 111* 89 153*       Signed:  Kayleen Memos, MD [facility] 06/05/2017, 1:18 PM

## 2017-06-05 NOTE — Progress Notes (Signed)
Physical Therapy Treatment Patient Details Name: Andrea [name] MRN: [id number] DOB: [birth year] Today's Date: 06/05/2017    History of Present Illness 67 yo female admitted with hyperglycemia, N/V/D, acute onset R UE weakness. Hx of DM, falls, chronic pain, epilepsy    PT Comments    Assisted OOB to amb a greater distance but still limited due to fatigue/weakness and unsteady requiring need for a walker.    Follow Up Recommendations  SNF     Equipment Recommendations  None recommended by PT    Recommendations for Other Services       Precautions / Restrictions Precautions Precautions: Fall Restrictions Weight Bearing Restrictions: No Other Position/Activity Restrictions: WBAT    Mobility  Bed Mobility Overal bed mobility: Needs Assistance Bed Mobility: Supine to Sit     Supine to sit: Supervision;Min guard     General bed mobility comments: able to self perform with increased time and use of rail  Transfers Overall transfer level: Needs assistance Equipment used: None;Rolling walker (2 wheeled) Transfers: Sit to/from [facility] Sit to Stand: Supervision;Min guard Stand pivot transfers: Supervision;Min guard       General transfer comment: assist for set up/equipment  Ambulation/Gait Ambulation/Gait assistance: Min guard;Min assist Ambulation Distance (Feet): 55 Feet Assistive device: Rolling walker (2 wheeled) Gait Pattern/deviations: Step-to pattern;Decreased step length - right;Decreased step length - left;Staggering left;Staggering right Gait velocity: decreased   General Gait Details: mild unsteady gait with R UE tremors 25%.  Tolerated an increased distance but still limited due to fatigue/weakness.  No dizziness this session.     Stairs             Wheelchair Mobility    Modified Rankin (Stroke Patients Only)       Balance                                            Cognition  Arousal/Alertness: Awake/alert Behavior During Therapy: WFL for tasks assessed/performed Overall Cognitive Status: Within Functional Limits for tasks assessed                                        Exercises      General Comments        Pertinent Vitals/Pain Pain Assessment: No/denies pain    Home Living                      Prior Function            PT Goals (current goals can now be found in the care plan section) Progress towards PT goals: Progressing toward goals    Frequency    Min 3X/week      PT Plan Current plan remains appropriate    Co-evaluation              AM-PAC PT "6 Clicks" Daily Activity  Outcome Measure  Difficulty turning over in bed (including adjusting bedclothes, sheets and blankets)?: A Little Difficulty moving from lying on back to sitting on the side of the bed? : A Little Difficulty sitting down on and standing up from a chair with arms (e.g., wheelchair, bedside commode, etc,.)?: A Little Help needed moving to and from a bed to chair (including a wheelchair)?: A Little Help needed walking in hospital room?:  A Little Help needed climbing 3-5 steps with a railing? : A Lot 6 Click Score: 17    End of Session Equipment Utilized During Treatment: Gait belt Activity Tolerance: Patient limited by fatigue Patient left: Other (comment)(on BSC next to bed with call light in reach, pt requst to sit a while) Nurse Communication: Mobility status PT Visit Diagnosis: Muscle weakness (generalized) (M62.81);Difficulty in walking, not elsewhere classified (R26.2);Other abnormalities of gait and mobility (R26.89);History of falling (Z91.81)     Time: 9038-3338 PT Time Calculation (min) (ACUTE ONLY): 17 min  Charges:  $Gait Training: 8-22 mins                    G Codes:       Rica Koyanagi  PTA [facility]  Acute  Rehab Pager      [phone]

## 2017-06-05 NOTE — Discharge Instructions (Signed)

## 2017-06-05 NOTE — Progress Notes (Signed)
Occupational Therapy Treatment Patient Details Name: Andrea [name] MRN: [id number] DOB: 1950/05/05 Today's Date: 06/05/2017    History of present illness 67 yo female admitted with hyperglycemia, N/V/D, acute onset R UE weakness. Hx of DM, falls, chronic pain, epilepsy   OT comments  Hand out provided regarding FM activties  Follow Up Recommendations  Home health OT(family refused SNF and wants HH.  Pt states she will have A at all times)    Equipment Recommendations  3 in 1 bedside commode    Recommendations for Other Services      Precautions / Restrictions Precautions Precautions: Fall Precaution Comments: jerky movements Restrictions Other Position/Activity Restrictions: WBAT       Mobility Bed Mobility   Bed Mobility: Supine to Sit;Sit to Supine     Supine to sit: Min guard Sit to supine: Min guard      Transfers            NT          Balance Overall balance assessment: Needs assistance   Sitting balance-Leahy Scale: Fair                                     ADL either performed or assessed with clinical judgement   ADL Overall ADL's : Needs assistance/impaired Eating/Feeding: Set up   Grooming: Set up;Sitting                                 General ADL Comments: pt sat EOB for some FM activiites including opening containers, texting and optaining needed items. Pt needed increased time and encouragement.  Pt states tasks are taking longer but she does feel she is improving.  Handout provided               Cognition Arousal/Alertness: Awake/alert Behavior During Therapy: WFL for tasks assessed/performed Overall Cognitive Status: Within Functional Limits for tasks assessed                                                     Pertinent Vitals/ Pain       Pain Assessment: No/denies pain     Prior Functioning/Environment              Frequency  Min 2X/week         Progress Toward Goals  OT Goals(current goals can now be found in the care plan section)  Progress towards OT goals: Progressing toward goals     Plan Discharge plan needs to be updated    Co-evaluation                 AM-PAC PT "6 Clicks" Daily Activity     Outcome Measure   Help from another person eating meals?: None Help from another person taking care of personal grooming?: A Little Help from another person toileting, which includes using toliet, bedpan, or urinal?: A Little Help from another person bathing (including washing, rinsing, drying)?: A Little Help from another person to put on and taking off regular upper body clothing?: A Little Help from another person to put on and taking off regular lower body clothing?: A Lot 6 Click Score: 18    End of Session  OT Visit Diagnosis: Unsteadiness on feet (R26.81);Muscle weakness (generalized) (M62.81)   Activity Tolerance Patient limited by fatigue;Patient tolerated treatment well   Patient Left in bed;with call bell/phone within reach;with bed alarm set   Nurse Communication          Time: 1030-1050 OT Time Calculation (min): 20 min  Charges: OT General Charges $OT Visit: 1 Visit OT Treatments $Therapeutic Activity: 8-22 mins  [place], [place]   Betsy Pries 06/05/2017, 11:27 AM

## 2017-12-26 ENCOUNTER — Inpatient Hospital Stay (HOSPITAL_COMMUNITY)
Admission: EM | Admit: 2017-12-26 | Discharge: 2017-12-31 | DRG: 246 | Disposition: A | Discharge status: Home / Self Care | Payer: Medicare Other | Attending: Internal Medicine | Admitting: Internal Medicine

## 2017-12-26 ENCOUNTER — Inpatient Hospital Stay (HOSPITAL_COMMUNITY): Payer: Medicare Other

## 2017-12-26 ENCOUNTER — Emergency Department (HOSPITAL_COMMUNITY): Payer: Medicare Other

## 2017-12-26 ENCOUNTER — Other Ambulatory Visit: Payer: Self-pay

## 2017-12-26 ENCOUNTER — Encounter (HOSPITAL_COMMUNITY): Payer: Self-pay

## 2017-12-26 DIAGNOSIS — Z9049 Acquired absence of other specified parts of digestive tract: Secondary | ICD-10-CM

## 2017-12-26 DIAGNOSIS — E1122 Type 2 diabetes mellitus with diabetic chronic kidney disease: Secondary | ICD-10-CM | POA: Diagnosis present

## 2017-12-26 DIAGNOSIS — I361 Nonrheumatic tricuspid (valve) insufficiency: Secondary | ICD-10-CM

## 2017-12-26 DIAGNOSIS — I428 Other cardiomyopathies: Secondary | ICD-10-CM | POA: Diagnosis present

## 2017-12-26 DIAGNOSIS — Z90710 Acquired absence of both cervix and uterus: Secondary | ICD-10-CM | POA: Diagnosis not present

## 2017-12-26 DIAGNOSIS — Z885 Allergy status to narcotic agent status: Secondary | ICD-10-CM

## 2017-12-26 DIAGNOSIS — I2510 Atherosclerotic heart disease of native coronary artery without angina pectoris: Secondary | ICD-10-CM | POA: Diagnosis present

## 2017-12-26 DIAGNOSIS — J9601 Acute respiratory failure with hypoxia: Secondary | ICD-10-CM | POA: Diagnosis present

## 2017-12-26 DIAGNOSIS — I5041 Acute combined systolic (congestive) and diastolic (congestive) heart failure: Secondary | ICD-10-CM | POA: Diagnosis present

## 2017-12-26 DIAGNOSIS — I5031 Acute diastolic (congestive) heart failure: Secondary | ICD-10-CM | POA: Diagnosis not present

## 2017-12-26 DIAGNOSIS — G8929 Other chronic pain: Secondary | ICD-10-CM | POA: Diagnosis present

## 2017-12-26 DIAGNOSIS — Z9221 Personal history of antineoplastic chemotherapy: Secondary | ICD-10-CM | POA: Diagnosis not present

## 2017-12-26 DIAGNOSIS — I130 Hypertensive heart and chronic kidney disease with heart failure and stage 1 through stage 4 chronic kidney disease, or unspecified chronic kidney disease: Secondary | ICD-10-CM | POA: Diagnosis present

## 2017-12-26 DIAGNOSIS — N183 Chronic kidney disease, stage 3 (moderate): Secondary | ICD-10-CM | POA: Diagnosis present

## 2017-12-26 DIAGNOSIS — Z9013 Acquired absence of bilateral breasts and nipples: Secondary | ICD-10-CM | POA: Diagnosis not present

## 2017-12-26 DIAGNOSIS — Z794 Long term (current) use of insulin: Secondary | ICD-10-CM | POA: Diagnosis not present

## 2017-12-26 DIAGNOSIS — Z79890 Hormone replacement therapy (postmenopausal): Secondary | ICD-10-CM | POA: Diagnosis not present

## 2017-12-26 DIAGNOSIS — E1159 Type 2 diabetes mellitus with other circulatory complications: Secondary | ICD-10-CM | POA: Diagnosis not present

## 2017-12-26 DIAGNOSIS — R0602 Shortness of breath: Secondary | ICD-10-CM | POA: Diagnosis present

## 2017-12-26 DIAGNOSIS — D649 Anemia, unspecified: Secondary | ICD-10-CM | POA: Diagnosis present

## 2017-12-26 DIAGNOSIS — Z9861 Coronary angioplasty status: Secondary | ICD-10-CM

## 2017-12-26 DIAGNOSIS — R609 Edema, unspecified: Secondary | ICD-10-CM

## 2017-12-26 DIAGNOSIS — Z955 Presence of coronary angioplasty implant and graft: Secondary | ICD-10-CM

## 2017-12-26 DIAGNOSIS — M1990 Unspecified osteoarthritis, unspecified site: Secondary | ICD-10-CM | POA: Diagnosis present

## 2017-12-26 DIAGNOSIS — Z881 Allergy status to other antibiotic agents status: Secondary | ICD-10-CM

## 2017-12-26 DIAGNOSIS — Z9181 History of falling: Secondary | ICD-10-CM | POA: Diagnosis not present

## 2017-12-26 DIAGNOSIS — I10 Essential (primary) hypertension: Secondary | ICD-10-CM | POA: Diagnosis not present

## 2017-12-26 DIAGNOSIS — Z79899 Other long term (current) drug therapy: Secondary | ICD-10-CM | POA: Diagnosis not present

## 2017-12-26 DIAGNOSIS — IMO0001 Reserved for inherently not codable concepts without codable children: Secondary | ICD-10-CM

## 2017-12-26 DIAGNOSIS — E119 Type 2 diabetes mellitus without complications: Secondary | ICD-10-CM | POA: Diagnosis not present

## 2017-12-26 DIAGNOSIS — E038 Other specified hypothyroidism: Secondary | ICD-10-CM | POA: Diagnosis not present

## 2017-12-26 DIAGNOSIS — Z853 Personal history of malignant neoplasm of breast: Secondary | ICD-10-CM | POA: Diagnosis not present

## 2017-12-26 DIAGNOSIS — I370 Nonrheumatic pulmonary valve stenosis: Secondary | ICD-10-CM | POA: Diagnosis not present

## 2017-12-26 DIAGNOSIS — E039 Hypothyroidism, unspecified: Secondary | ICD-10-CM | POA: Diagnosis present

## 2017-12-26 DIAGNOSIS — Z880 Allergy status to penicillin: Secondary | ICD-10-CM

## 2017-12-26 DIAGNOSIS — I255 Ischemic cardiomyopathy: Secondary | ICD-10-CM | POA: Diagnosis present

## 2017-12-26 DIAGNOSIS — Z79891 Long term (current) use of opiate analgesic: Secondary | ICD-10-CM | POA: Diagnosis not present

## 2017-12-26 DIAGNOSIS — M549 Dorsalgia, unspecified: Secondary | ICD-10-CM | POA: Diagnosis present

## 2017-12-26 DIAGNOSIS — I5021 Acute systolic (congestive) heart failure: Secondary | ICD-10-CM | POA: Diagnosis not present

## 2017-12-26 DIAGNOSIS — N179 Acute kidney failure, unspecified: Secondary | ICD-10-CM | POA: Diagnosis not present

## 2017-12-26 DIAGNOSIS — I509 Heart failure, unspecified: Secondary | ICD-10-CM | POA: Insufficient documentation

## 2017-12-26 DIAGNOSIS — Z886 Allergy status to analgesic agent status: Secondary | ICD-10-CM

## 2017-12-26 DIAGNOSIS — Z888 Allergy status to other drugs, medicaments and biological substances status: Secondary | ICD-10-CM

## 2017-12-26 LAB — CBC WITH DIFFERENTIAL/PLATELET
ABS IMMATURE GRANULOCYTES: 0.01 10*3/uL (ref 0.00–0.07)
Basophils Absolute: 0 10*3/uL (ref 0.0–0.1)
Basophils Relative: 0 %
Eosinophils Absolute: 0.2 10*3/uL (ref 0.0–0.5)
Eosinophils Relative: 2 %
HCT: 34.7 % — ABNORMAL LOW (ref 36.0–46.0)
Hemoglobin: 11.3 g/dL — ABNORMAL LOW (ref 12.0–15.0)
Immature Granulocytes: 0 %
Lymphocytes Relative: 25 %
Lymphs Abs: 1.9 10*3/uL (ref 0.7–4.0)
MCH: 30.2 pg (ref 26.0–34.0)
MCHC: 32.6 g/dL (ref 30.0–36.0)
MCV: 92.8 fL (ref 80.0–100.0)
MONOS PCT: 10 %
Monocytes Absolute: 0.8 10*3/uL (ref 0.1–1.0)
NEUTROS ABS: 4.6 10*3/uL (ref 1.7–7.7)
NEUTROS PCT: 63 %
Platelets: 205 10*3/uL (ref 150–400)
RBC: 3.74 MIL/uL — ABNORMAL LOW (ref 3.87–5.11)
RDW: 12.9 % (ref 11.5–15.5)
WBC: 7.4 10*3/uL (ref 4.0–10.5)
nRBC: 0 % (ref 0.0–0.2)

## 2017-12-26 LAB — BASIC METABOLIC PANEL
ANION GAP: 9 (ref 5–15)
BUN: 27 mg/dL — ABNORMAL HIGH (ref 8–23)
CO2: 28 mmol/L (ref 22–32)
Calcium: 8.9 mg/dL (ref 8.9–10.3)
Chloride: 102 mmol/L (ref 98–111)
Creatinine, Ser: 1.04 mg/dL — ABNORMAL HIGH (ref 0.44–1.00)
GFR calc Af Amer: >60 mL/min (ref >60)
GFR calc non Af Amer: 56 mL/min — ABNORMAL LOW (ref >60)
GLUCOSE: 252 mg/dL — AB (ref 70–99)
Potassium: 4.7 mmol/L (ref 3.5–5.1)
Sodium: 139 mmol/L (ref 135–145)

## 2017-12-26 LAB — CBC
HCT: 32.8 % — ABNORMAL LOW (ref 36.0–46.0)
Hemoglobin: 10.5 g/dL — ABNORMAL LOW (ref 12.0–15.0)
MCH: 29.7 pg (ref 26.0–34.0)
MCHC: 32 g/dL (ref 30.0–36.0)
MCV: 92.7 fL (ref 80.0–100.0)
Platelets: 190 10*3/uL (ref 150–400)
RBC: 3.54 MIL/uL — ABNORMAL LOW (ref 3.87–5.11)
RDW: 12.9 % (ref 11.5–15.5)
WBC: 6.7 10*3/uL (ref 4.0–10.5)
nRBC: 0 % (ref 0.0–0.2)

## 2017-12-26 LAB — CREATININE, SERUM
Creatinine, Ser: 0.97 mg/dL (ref 0.44–1.00)
GFR calc Af Amer: >60 mL/min (ref >60)
GFR calc non Af Amer: >60 mL/min (ref >60)

## 2017-12-26 LAB — GLUCOSE, CAPILLARY
Glucose-Capillary: 133 mg/dL — ABNORMAL HIGH (ref 70–99)
Glucose-Capillary: 193 mg/dL — ABNORMAL HIGH (ref 70–99)

## 2017-12-26 LAB — I-STAT TROPONIN, ED: Troponin i, poc: 0.02 ng/mL (ref 0.00–0.08)

## 2017-12-26 LAB — CBG MONITORING, ED: Glucose-Capillary: 178 mg/dL — ABNORMAL HIGH (ref 70–99)

## 2017-12-26 LAB — BRAIN NATRIURETIC PEPTIDE: B NATRIURETIC PEPTIDE 5: 714.4 pg/mL — AB (ref 0.0–100.0)

## 2017-12-26 LAB — ECHOCARDIOGRAM COMPLETE

## 2017-12-26 LAB — D-DIMER, QUANTITATIVE: D-Dimer, Quant: 1.91 ug/mL-FEU — ABNORMAL HIGH (ref 0.00–0.50)

## 2017-12-26 MED ORDER — IPRATROPIUM-ALBUTEROL 0.5-2.5 (3) MG/3ML IN SOLN
3.0000 mL | Freq: Once | RESPIRATORY_TRACT | Status: AC
  Administered 2017-12-26: 3 mL via RESPIRATORY_TRACT
  Filled 2017-12-26: qty 3

## 2017-12-26 MED ORDER — ALPRAZOLAM 0.5 MG PO TABS
0.5000 mg | ORAL_TABLET | Freq: Once | ORAL | Status: AC
  Administered 2017-12-26: 0.5 mg via ORAL
  Filled 2017-12-26: qty 1

## 2017-12-26 MED ORDER — ENOXAPARIN SODIUM 40 MG/0.4ML ~~LOC~~ SOLN
40.0000 mg | SUBCUTANEOUS | Status: DC
  Administered 2017-12-26 – 2017-12-28 (×3): 40 mg via SUBCUTANEOUS
  Filled 2017-12-26 (×3): qty 0.4

## 2017-12-26 MED ORDER — OXYCODONE-ACETAMINOPHEN 10-325 MG PO TABS: 1.0000 | ORAL_TABLET | Freq: Three times a day (TID) | ORAL | Status: DC | PRN

## 2017-12-26 MED ORDER — LEVOTHYROXINE SODIUM 200 MCG PO TABS
200.0000 ug | ORAL_TABLET | Freq: Every day | ORAL | Status: DC
  Filled 2017-12-26: qty 2
  Filled 2017-12-26: qty 1

## 2017-12-26 MED ORDER — OXYCODONE-ACETAMINOPHEN 5-325 MG PO TABS
1.0000 | ORAL_TABLET | Freq: Three times a day (TID) | ORAL | Status: DC | PRN
  Administered 2017-12-26 – 2017-12-27 (×3): 1 via ORAL
  Filled 2017-12-26 (×3): qty 1

## 2017-12-26 MED ORDER — LEVOTHYROXINE SODIUM 100 MCG PO TABS
200.0000 ug | ORAL_TABLET | Freq: Every day | ORAL | Status: DC
  Administered 2017-12-27 – 2017-12-31 (×5): 200 ug via ORAL
  Filled 2017-12-26 (×5): qty 2

## 2017-12-26 MED ORDER — INSULIN GLARGINE 100 UNIT/ML ~~LOC~~ SOLN
20.0000 [IU] | Freq: Every day | SUBCUTANEOUS | Status: DC
  Administered 2017-12-26 – 2017-12-30 (×5): 20 [IU] via SUBCUTANEOUS
  Filled 2017-12-26 (×6): qty 0.2

## 2017-12-26 MED ORDER — PROMETHAZINE HCL 25 MG PO TABS
25.0000 mg | ORAL_TABLET | Freq: Four times a day (QID) | ORAL | Status: DC | PRN
  Administered 2017-12-26 – 2017-12-28 (×3): 25 mg via ORAL
  Filled 2017-12-26 (×4): qty 1

## 2017-12-26 MED ORDER — ONDANSETRON HCL 4 MG/2ML IJ SOLN
4.0000 mg | Freq: Four times a day (QID) | INTRAMUSCULAR | Status: DC | PRN
  Administered 2017-12-27 – 2017-12-31 (×2): 4 mg via INTRAVENOUS
  Filled 2017-12-26 (×2): qty 2

## 2017-12-26 MED ORDER — ACETAMINOPHEN 325 MG PO TABS
650.0000 mg | ORAL_TABLET | Freq: Four times a day (QID) | ORAL | Status: DC | PRN
  Administered 2017-12-28 – 2017-12-30 (×4): 650 mg via ORAL
  Filled 2017-12-26 (×4): qty 2

## 2017-12-26 MED ORDER — ATENOLOL 50 MG PO TABS
50.0000 mg | ORAL_TABLET | Freq: Two times a day (BID) | ORAL | Status: DC
  Administered 2017-12-26 (×2): 50 mg via ORAL
  Filled 2017-12-26 (×2): qty 1

## 2017-12-26 MED ORDER — AMLODIPINE BESYLATE 10 MG PO TABS
10.0000 mg | ORAL_TABLET | Freq: Every day | ORAL | Status: DC
  Administered 2017-12-26: 10 mg via ORAL
  Filled 2017-12-26: qty 2

## 2017-12-26 MED ORDER — PREGABALIN 25 MG PO CAPS
75.0000 mg | ORAL_CAPSULE | Freq: Two times a day (BID) | ORAL | Status: DC
  Administered 2017-12-26 – 2017-12-31 (×11): 75 mg via ORAL
  Filled 2017-12-26: qty 1
  Filled 2017-12-26: qty 3
  Filled 2017-12-26: qty 1
  Filled 2017-12-26: qty 3
  Filled 2017-12-26 (×7): qty 1

## 2017-12-26 MED ORDER — ONDANSETRON HCL 4 MG PO TABS
4.0000 mg | ORAL_TABLET | Freq: Four times a day (QID) | ORAL | Status: DC | PRN
  Administered 2017-12-27: 4 mg via ORAL
  Filled 2017-12-26: qty 1

## 2017-12-26 MED ORDER — ACETAMINOPHEN 650 MG RE SUPP: 650.0000 mg | Freq: Four times a day (QID) | RECTAL | Status: DC | PRN

## 2017-12-26 MED ORDER — FUROSEMIDE 10 MG/ML IJ SOLN
40.0000 mg | Freq: Two times a day (BID) | INTRAMUSCULAR | Status: DC
  Administered 2017-12-26 (×2): 40 mg via INTRAVENOUS
  Filled 2017-12-26 (×2): qty 4

## 2017-12-26 MED ORDER — INSULIN ASPART 100 UNIT/ML ~~LOC~~ SOLN
0.0000 [IU] | Freq: Three times a day (TID) | SUBCUTANEOUS | Status: DC
  Administered 2017-12-26: 1 [IU] via SUBCUTANEOUS
  Administered 2017-12-26: 2 [IU] via SUBCUTANEOUS
  Filled 2017-12-26: qty 1

## 2017-12-26 MED ORDER — ALPRAZOLAM 0.5 MG PO TABS
0.5000 mg | ORAL_TABLET | Freq: Four times a day (QID) | ORAL | Status: DC | PRN
  Administered 2017-12-26 – 2017-12-31 (×9): 0.5 mg via ORAL
  Filled 2017-12-26 (×9): qty 1

## 2017-12-26 MED ORDER — LISINOPRIL 10 MG PO TABS
10.0000 mg | ORAL_TABLET | Freq: Every day | ORAL | Status: DC
  Administered 2017-12-26: 10 mg via ORAL
  Filled 2017-12-26: qty 1

## 2017-12-26 MED ORDER — OXYCODONE HCL 5 MG PO TABS
5.0000 mg | ORAL_TABLET | Freq: Three times a day (TID) | ORAL | Status: DC | PRN
  Administered 2017-12-26 – 2017-12-27 (×3): 5 mg via ORAL
  Filled 2017-12-26 (×3): qty 1

## 2017-12-26 MED ORDER — FUROSEMIDE 10 MG/ML IJ SOLN
20.0000 mg | Freq: Once | INTRAMUSCULAR | Status: AC
  Administered 2017-12-26: 20 mg via INTRAVENOUS
  Filled 2017-12-26: qty 4

## 2017-12-26 NOTE — ED Provider Notes (Signed)
[place] DEPT Provider Note   CSN: [id number] Arrival date & time: 12/26/17  4034     History   Chief Complaint Chief Complaint  Patient presents with  . Shortness of Breath    HPI Andrea [name] is a 67 y.o. female possible history of breast cancer (2016), diabetes who presents from EMS for evaluation of shortness of breath.  Patient reports that she has been having some chest pain and shortness of breath since yesterday evening.  She states that the chest pain was a constant pressure "like an elephant sitting on her chest."  She states that it was worsened with deep inspiration and with exertion.  She denies any associated nausea, diaphoresis.  She also reports that shortness of breath is worse with exertion.  She reports normally she is able to walk around her house with any difficulty and states that since yesterday, she has had to stop about every chair and take a break.  She also reports she has had some swelling to bilateral lower extremities.  No overlying warmth, erythema.  She does report she has had some cough, nasal congestion.  He denies any history of COPD or asthma.  She does not smoke.  Patient denies any fever, abdominal pain, nausea/vomiting, numbness/weakness of arms or legs.  She reports he has a history of breast cancer in 2016 but no current cancer.  Patient with no history of recent surgeries, blood clots, recent hospitalizations.,  Recent long travel.  Per EMS, they initially heard wheezing on their initial arrival.  They gave breathing treatment.  The history is provided by the patient.    Past Medical History:  Diagnosis Date  . Breast cancer ([place])    bilat mastectomy and LUE lymphadenectomy 2014, chemoRx 2014 - 15  . Diabetes mellitus without complication ([place])   . Esophageal reflux   . Hypertension   . Hypothyroid   . Migraine   . Seizure [facility])     Patient Active Problem List   Diagnosis Date Noted  . Acute CHF  (congestive heart failure) ([place]) 12/26/2017  . Unspecified abdominal pain 06/02/2017  . Hyperglycemia 06/01/2017  . C. difficile colitis 03/17/2017  . AKI (acute kidney injury) ([place])   . Diverticulitis 02/17/2017  . Frequent falls 01/08/2017  . Rhabdomyolysis 01/08/2017  . Chronic pain 01/08/2017  . Intractable nausea and vomiting 11/23/2016  . Diarrhea 11/23/2016  . UTI (urinary tract infection) 12/03/2013  . Fall 12/02/2013  . Hypertension   . Breast cancer ([place])   . Diabetes mellitus without complication ([place])   . Migraine   . Hypothyroid   . Epilepsy ([place])   . Esophageal reflux   . Multiple rib fractures 11/28/2013    Past Surgical History:  Procedure Laterality Date  . ABDOMINAL HYSTERECTOMY     1984 , done for ruptured cyst and endometriosis  . APPENDECTOMY    . CHOLECYSTECTOMY     1980's  . EYE SURGERY  08/28/2015   Right eye  . KNEE SURGERY Left   . MASTECTOMY     bilateral  . Open surgery for bowel obstruction, 2000's       OB History   None      Home Medications    Prior to Admission medications   Medication Sig Start Date End Date Taking? Authorizing Provider  ALPRAZolam Duanne Moron) 0.5 MG tablet Take 0.5 mg by mouth 4 (four) times daily as needed for anxiety.  11/21/16  Yes [provider]  amLODipine (NORVASC) 10  MG tablet Take 1 tablet (10 mg total) by mouth daily. 06/06/17  Yes Hall, Carole N, DO  atenolol (TENORMIN) 100 MG tablet Take 50 mg by mouth 2 (two) times daily.   Yes [provider]  LANTUS SOLOSTAR 100 UNIT/ML Solostar Pen INJECT 20 - 50 UNITS UNDER THE SKIN DAILY AS DIRECTED 03/20/17  Yes [provider]  levothyroxine (SYNTHROID, LEVOTHROID) 200 MCG tablet Take 1 tablet (200 mcg total) by mouth at bedtime. Patient taking differently: Take 200 mcg by mouth daily before breakfast.  01/12/17  Yes Lavina Hamman, MD  lisinopril (PRINIVIL,ZESTRIL) 10 MG tablet Take 10 mg by mouth at bedtime.    Yes [provider]  MORPHABOND ER 30 MG T12A Take 1 tablet by mouth every 12 (twelve) hours. 12/15/17  Yes [provider]  oxyCODONE-acetaminophen (PERCOCET) 10-325 MG tablet Take 1 tablet by mouth 3 (three) times daily as needed for pain. 02/03/17  Yes [provider]  pregabalin (LYRICA) 75 MG capsule Take 1 capsule (75 mg total) 2 (two) times daily by mouth. 11/24/16  Yes Osei-Bonsu, Iona Beard, MD  promethazine (PHENERGAN) 25 MG tablet Take 25 mg by mouth every 6 (six) hours as needed for nausea.  05/30/17  Yes [provider]  ciprofloxacin (CIPRO) 500 MG tablet Take 1 tablet (500 mg total) by mouth 2 (two) times daily. Patient not taking: Reported on 12/26/2017 06/05/17   Kayleen Memos, DO  metroNIDAZOLE (FLAGYL) 500 MG tablet Take 1 tablet (500 mg total) by mouth 3 (three) times daily. Patient not taking: Reported on 12/26/2017 06/05/17   Kayleen Memos, DO  saccharomyces boulardii (FLORASTOR) 250 MG capsule Take 1 capsule (250 mg total) by mouth 2 (two) times daily. Patient not taking: Reported on 12/26/2017 02/24/17   Patrecia Pour, MD    Family History Family History  Problem Relation Age of Onset  . Sudden Cardiac Death Neg Hx     Social History Social History   Tobacco Use  . Smoking status: Never Smoker  . Smokeless tobacco: Never Used  Substance Use Topics  . Alcohol use: No  . Drug use: No     Allergies   Aspirin; Maxzide [hydrochlorothiazide w-triamterene]; Metformin and related; Nsaids; Other; Pravachol [pravastatin]; Stadol [butorphanol]; Toradol [ketorolac tromethamine]; Vistaril [hydroxyzine hcl]; Erythromycin; Morphine and related; and Penicillins   Review of Systems Review of Systems  Constitutional: Negative for fever.  Respiratory: Positive for cough and shortness of breath.   Cardiovascular: Positive for chest pain and leg swelling.  Gastrointestinal: Negative for abdominal pain, nausea and vomiting.  Genitourinary: Negative for dysuria and hematuria.    Neurological: Negative for headaches.  All other systems reviewed and are negative.    Physical Exam Updated Vital Signs BP (!) 167/68 (BP Location: Right Arm)   Pulse 82   Temp 98.4 F (36.9 C) (Oral)   Resp 12   SpO2 97%   Physical Exam  Constitutional: She is oriented to person, place, and time. She appears well-developed and well-nourished.  HENT:  Head: Normocephalic and atraumatic.  Mouth/Throat: Oropharynx is clear and moist and mucous membranes are normal.  Eyes: Pupils are equal, round, and reactive to light. Conjunctivae, EOM and lids are normal.  Neck: Full passive range of motion without pain.  Cardiovascular: Normal rate, regular rhythm, normal heart sounds and normal pulses. Exam reveals no gallop and no friction rub.  No murmur heard. Pulses:      Radial pulses are 2+ on the right side, and  2+ on the left side.  Pulmonary/Chest: Effort normal. Tachypnea noted. She has decreased breath sounds.  Increased work of breathing noted.  Decreased breath sounds noted.  Patient speaking in short sentences.  No obvious wheezing noted.  Abdominal: Soft. Normal appearance. There is no tenderness. There is no rigidity and no guarding.  Abdomen is soft, non-distended, non-tender. No rigidity, No guarding. No peritoneal signs.  Musculoskeletal: Normal range of motion.  1+ pitting edema noted to bilateral lower extremities that extends from about the mid tib-fib distally.  Neurological: She is alert and oriented to person, place, and time.  Skin: Skin is warm and dry. Capillary refill takes less than 2 seconds.  Psychiatric: She has a normal mood and affect. Her speech is normal.  Nursing note and vitals reviewed.    ED Treatments / Results  Labs (all labs ordered are listed, but only abnormal results are displayed) Labs Reviewed  BASIC METABOLIC PANEL - Abnormal; Notable for the following components:      Result Value   Glucose, Bld 252 (*)    BUN 27 (*)    Creatinine,  Ser 1.04 (*)    GFR calc non Af Amer 56 (*)    All other components within normal limits  CBC WITH DIFFERENTIAL/PLATELET - Abnormal; Notable for the following components:   RBC 3.74 (*)    Hemoglobin 11.3 (*)    HCT 34.7 (*)    All other components within normal limits  BRAIN NATRIURETIC PEPTIDE - Abnormal; Notable for the following components:   B Natriuretic Peptide 714.4 (*)    All other components within normal limits  D-DIMER, QUANTITATIVE (NOT AT [facility]) - Abnormal; Notable for the following components:   D-Dimer, Quant 1.91 (*)    All other components within normal limits  I-STAT TROPONIN, ED    EKG EKG Interpretation  Date/Time:  Friday December 26 2017 04:26:52 EST Ventricular Rate:  88 PR Interval:    QRS Duration: 82 QT Interval:  383 QTC Calculation: 464 R Axis:   65 Text Interpretation:  Sinus rhythm Anteroseptal infarct, old Nonspecific T abnormalities, lateral leads No significant change since last tracing Confirmed by Pryor Curia [phone]) on 12/26/2017 4:30:25 AM   Radiology Dg Chest Portable 1 View  Result Date: 12/26/2017 CLINICAL DATA:  Initial evaluation for acute chest pain. EXAM: PORTABLE CHEST 1 VIEW COMPARISON:  Prior radiograph from 02/17/2017 FINDINGS: Cardiomegaly, stable. Mediastinal silhouette within normal limits. Lungs mildly hypoinflated. Diffuse vascular congestion with interstitial prominence, compatible with pulmonary interstitial edema. Asymmetric hazy opacity within the right lung base could reflect asymmetric edema or possibly infiltrate. No appreciable pleural effusion. No pneumothorax. No acute osseus abnormality. IMPRESSION: 1. Cardiomegaly with mild diffuse pulmonary interstitial edema. 2. Superimposed asymmetric hazy opacity within the right lung base, which could reflect asymmetric edema or possibly infiltrate. Electronically Signed   By: Jeannine Boga M.D.   On: 12/26/2017 04:13    Procedures Procedures (including critical care  time)  Medications Ordered in ED Medications  ipratropium-albuterol (DUONEB) 0.5-2.5 (3) MG/3ML nebulizer solution 3 mL (3 mLs Nebulization Given 12/26/17 0434)  ALPRAZolam (XANAX) tablet 0.5 mg (0.5 mg Oral Given 12/26/17 0454)  furosemide (LASIX) injection 20 mg (20 mg Intravenous Given 12/26/17 0618)     Initial Impression / Assessment and Plan / ED Course  I have reviewed the triage vital signs and the nursing notes.  Pertinent labs & imaging results that were available during my care of the patient were reviewed by me and considered in  my medical decision making (see chart for details).     67 year old female past medical history diabetes, breast cancer 2016, who presents for evaluation of shortness of breath, chest pain this been ongoing since yesterday.  Also noted some swelling to bilateral lower extremities.  On initial ED arrival, patient is afebrile.  She is requiring 2 L of oxygen to maintain sats between 94 and 95 which is abnormal for her.  She currently has no oxygen requirement at home.  She is speaking in very short sentences.  No obvious wheezing noted on exam but she does have some decreased breath sounds.  1+ pitting edema noted.  Consider CHF exacerbation versus ACS etiology versus infectious etiology.  Low suspicion for PE given history/physical exam but will plan for d-dimer for evaluation.  Initial troponin negative.  BMP shows BUN at 27 and creatinine at 1.04.  BNP is elevated at 714.4.  CBC shows no leukocytosis.  Hemoglobin 11.3.  D-dimer is elevated.  Chest x-ray shows cardiomegaly with mild diffuse pulmonary interstitial edema.  There is also a superimposed asymmetric hazy opacity within the right lung base that could be asymmetric edema or infiltrate.  Given no leukocytosis or fever, will hold antibiotics at this time.  Patient given dose of Lasix to help with symptoms.  Given her positive d-dimer, will plan for VQ scan, though I suspect her symptoms are from new onset  heart failure.  Given new oxygen requirement, findings on chest x-ray, feel that patient needs admission for further evaluation.  Discussed with Dr. Hal Hope (hospitalist).  Will accept patient for admission.   Final Clinical Impressions(s) / ED Diagnoses   Final diagnoses:  SOB (shortness of breath)    ED Discharge Orders    None       Volanda Napoleon, PA-C 12/26/17 [id number]    Ward, Delice Bison, DO 12/26/17 2671

## 2017-12-26 NOTE — ED Provider Notes (Signed)
Medical screening examination/treatment/procedure(s) were conducted as a shared visit with non-physician practitioner(s) and myself.  I personally evaluated the patient during the encounter.  EKG Interpretation  Date/Time:  Friday December 26 2017 04:26:52 EST Ventricular Rate:  88 PR Interval:    QRS Duration: 82 QT Interval:  383 QTC Calculation: 464 R Axis:   65 Text Interpretation:  Sinus rhythm Anteroseptal infarct, old Nonspecific T abnormalities, lateral leads No significant change since last tracing Confirmed by Pryor Curia [phone]) on 12/26/2017 4:30:25 AM    Patient is a 67 year old female who presents to the emergency department chest pain and shortness of breath.  Both are worse with exertion and deep inspiration.  Has peripheral edema on examination.  Chest x-ray shows pulmonary edema and atelectasis versus infiltrate.  Will treat for pneumonia and give IV diuresis.  She will need admission as she has a new oxygen requirement.   Andrea [name], Delice Bison, DO 12/26/17 0430

## 2017-12-26 NOTE — ED Triage Notes (Signed)
Per EMS, patient coming from home with complaints of SOB for the past three hours. She had expiratory wheezing and diminished sounds in lower lobes. Denies fever or chest pain. SPO2 was 88% on RA at home. EMS administered a 5 mg albuterol neb and placed patient on 6 L O2.

## 2017-12-26 NOTE — H&P (Signed)
History and Physical    Andrea [name] PTW:[id number] DOB: [birth year] DOA: 12/26/2017  PCP: [facility]   Patient coming from: Home   Chief Complaint: Dyspnea and lower extremity edema.   HPI: Andrea [name] is a 67 y.o. female with medical history significant of type 2 diabetes mellitus, hypertension, hypothyroid, seizures and history of breast cancer.  Patient reported 10 days of not feeling well, mainly dyspnea on exertion that has progressed in intensity, associated with chest tightness, congestion, 14 pound weight gain and lower extremity edema.  During the last 48 hours her symptoms have been more intense, with no improving factors, worse with exertion, no associated fevers or chills.  She called EMS 48 H before admission, she was advised to come to the emergency room but she declined, she called again the following day, this time her oximetry was down to 80% and she agreed to come to the hospital.  About 10 days ago she had a local steroid injection to her back for chronic osteoarthritis.  At her baseline patient is able to do her routine activities of daily living with no major physical limitation, she uses occasionally a walker.  Usually she is able to climb steps at a slow pace.   ED Course: Patient was found hypervolemic, received IV diuresis, further work-up revealed a elevated d-dimer.  She was referred for admission for evaluation.  Review of Systems:  1. General: No fevers, no chills, no weight gain or weight loss 2. ENT: No runny nose or sore throat, no hearing disturbances 3. Pulmonary: positive dyspnea, no cough, wheezing, or hemoptysis 4. Cardiovascular: No angina, or claudication, but lower extremity edema. 5. Gastrointestinal: No nausea or vomiting, no diarrhea or constipation. Poor appetite.  6. Hematology: No easy bruisability or frequent infections 7. Urology: No dysuria, hematuria or increased urinary frequency 8. Dermatology: No  rashes. 9. Neurology: No seizures or paresthesias 10. Musculoskeletal: No joint pain or deformities  Past Medical History:  Diagnosis Date  . Breast cancer ([place])    bilat mastectomy and LUE lymphadenectomy 2014, chemoRx 2014 - 15  . Diabetes mellitus without complication ([place])   . Esophageal reflux   . Hypertension   . Hypothyroid   . Migraine   . Seizure [facility])     Past Surgical History:  Procedure Laterality Date  . ABDOMINAL HYSTERECTOMY     1984 , done for ruptured cyst and endometriosis  . APPENDECTOMY    . CHOLECYSTECTOMY     1980's  . EYE SURGERY  08/28/2015   Right eye  . KNEE SURGERY Left   . MASTECTOMY     bilateral  . Open surgery for bowel obstruction, 2000's       reports that she has never smoked. She has never used smokeless tobacco. She reports that she does not drink alcohol or use drugs.  Allergies  Allergen Reactions  . Aspirin Nausea And Vomiting  . Maxzide [Hydrochlorothiazide W-Triamterene] Swelling    Fluid retention  . Metformin And Related Diarrhea  . Nsaids Nausea And Vomiting  . Other Other (See Comments)    All steroids produce psychosis per pt Artificial sweeteners produce nausea and upset stomach.  Gregary Cromer [Pravastatin] Other (See Comments)    unknown  . Stadol [Butorphanol] Other (See Comments)    Toradol, etc And related- hallucinations  . Toradol [Ketorolac Tromethamine] Other (See Comments)    hallucinations  . Vistaril [Hydroxyzine Hcl] Other (See Comments)    unknown  . Erythromycin Itching and Rash  .  Morphine And Related Hives and Rash    Iv morphine.  . Penicillins Hives, Itching and Rash    Has patient had a PCN reaction causing immediate rash, facial/tongue/throat swelling, SOB or lightheadedness with hypotension: yes Has patient had a PCN reaction causing severe rash involving mucus membranes or skin necrosis: unknown Has patient had a PCN reaction that required hospitalization : unknown Has patient had a PCN  reaction occurring within the last 10 years: no If all of the above answers are "NO", then may proceed with Cephalosporin use.     Family History  Problem Relation Age of Onset  . Sudden Cardiac Death Neg Hx      Prior to Admission medications   Medication Sig Start Date End Date Taking? Authorizing Provider  ALPRAZolam Duanne Moron) 0.5 MG tablet Take 0.5 mg by mouth 4 (four) times daily as needed for anxiety.  11/21/16  Yes [provider]  amLODipine (NORVASC) 10 MG tablet Take 1 tablet (10 mg total) by mouth daily. 06/06/17  Yes Hall, Carole N, DO  atenolol (TENORMIN) 100 MG tablet Take 50 mg by mouth 2 (two) times daily.   Yes [provider]  LANTUS SOLOSTAR 100 UNIT/ML Solostar Pen INJECT 20 - 50 UNITS UNDER THE SKIN DAILY AS DIRECTED 03/20/17  Yes [provider]  levothyroxine (SYNTHROID, LEVOTHROID) 200 MCG tablet Take 1 tablet (200 mcg total) by mouth at bedtime. Patient taking differently: Take 200 mcg by mouth daily before breakfast.  01/12/17  Yes Lavina Hamman, MD  lisinopril (PRINIVIL,ZESTRIL) 10 MG tablet Take 10 mg by mouth at bedtime.    Yes [provider]  MORPHABOND ER 30 MG T12A Take 1 tablet by mouth every 12 (twelve) hours. 12/15/17  Yes [provider]  oxyCODONE-acetaminophen (PERCOCET) 10-325 MG tablet Take 1 tablet by mouth 3 (three) times daily as needed for pain. 02/03/17  Yes [provider]  pregabalin (LYRICA) 75 MG capsule Take 1 capsule (75 mg total) 2 (two) times daily by mouth. 11/24/16  Yes Osei-Bonsu, Iona Beard, MD  promethazine (PHENERGAN) 25 MG tablet Take 25 mg by mouth every 6 (six) hours as needed for nausea.  05/30/17  Yes [provider]  ciprofloxacin (CIPRO) 500 MG tablet Take 1 tablet (500 mg total) by mouth 2 (two) times daily. Patient not taking: Reported on 12/26/2017 06/05/17   Kayleen Memos, DO  metroNIDAZOLE (FLAGYL) 500 MG tablet Take 1 tablet (500 mg total) by mouth 3 (three) times  daily. Patient not taking: Reported on 12/26/2017 06/05/17   Kayleen Memos, DO  saccharomyces boulardii (FLORASTOR) 250 MG capsule Take 1 capsule (250 mg total) by mouth 2 (two) times daily. Patient not taking: Reported on 12/26/2017 02/24/17   Patrecia Pour, MD    Physical Exam: Vitals:   12/26/17 0434 12/26/17 0600 12/26/17 0700 12/26/17 0800  BP:  (!) 167/68 (!) 156/72 (!) 155/66  Pulse:  82 78 74  Resp:  12 11 14   Temp:      TempSrc:      SpO2: 96% 97% 96% 95%    Vitals:   12/26/17 0434 12/26/17 0600 12/26/17 0700 12/26/17 0800  BP:  (!) 167/68 (!) 156/72 (!) 155/66  Pulse:  82 78 74  Resp:  12 11 14   Temp:      TempSrc:      SpO2: 96% 97% 96% 95%   General: deconditioned and dyspneic.  Neurology: Awake and alert, non focal Head and Neck. Head normocephalic. Neck supple  with no adenopathy or thyromegaly.   E ENT: mild pallor, no icterus, oral mucosa moist Cardiovascular: No JVD. S1-S2 present, rhythmic, no gallops, rubs, or murmurs. ++ non pitting bilateral lower extremity edema. Pulmonary: decreased breath sounds bilaterally, poor air movement, no wheezing, or rhonchi, positive bilateral rales. Gastrointestinal. Abdomen with no organomegaly, non tender, no rebound or guarding Skin. No rashes Musculoskeletal: no joint deformities    Labs on Admission: I have personally reviewed following labs and imaging studies  CBC: Recent Labs  Lab 12/26/17 0504  WBC 7.4  NEUTROABS 4.6  HGB 11.3*  HCT 34.7*  MCV 92.8  PLT 364   Basic Metabolic Panel: Recent Labs  Lab 12/26/17 0504  NA 139  K 4.7  CL 102  CO2 28  GLUCOSE 252*  BUN 27*  CREATININE 1.04*  CALCIUM 8.9   GFR: CrCl cannot be calculated (Unknown ideal weight.). Liver Function Tests: No results for input(s): AST, ALT, ALKPHOS, BILITOT, PROT, ALBUMIN in the last 168 hours. No results for input(s): LIPASE, AMYLASE in the last 168 hours. No results for input(s): AMMONIA in the last 168 hours. Coagulation  Profile: No results for input(s): INR, PROTIME in the last 168 hours. Cardiac Enzymes: No results for input(s): CKTOTAL, CKMB, CKMBINDEX, TROPONINI in the last 168 hours. BNP (last 3 results) No results for input(s): PROBNP in the last 8760 hours. HbA1C: No results for input(s): HGBA1C in the last 72 hours. CBG: No results for input(s): GLUCAP in the last 168 hours. Lipid Profile: No results for input(s): CHOL, HDL, LDLCALC, TRIG, CHOLHDL, LDLDIRECT in the last 72 hours. Thyroid Function Tests: No results for input(s): TSH, T4TOTAL, FREET4, T3FREE, THYROIDAB in the last 72 hours. Anemia Panel: No results for input(s): VITAMINB12, FOLATE, FERRITIN, TIBC, IRON, RETICCTPCT in the last 72 hours. Urine analysis:    Component Value Date/Time   COLORURINE YELLOW 06/01/2017 1108   APPEARANCEUR HAZY (A) 06/01/2017 1108   LABSPEC 1.028 06/01/2017 1108   PHURINE 5.0 06/01/2017 1108   GLUCOSEU >=500 (A) 06/01/2017 1108   HGBUR MODERATE (A) 06/01/2017 1108   BILIRUBINUR NEGATIVE 06/01/2017 1108   KETONESUR 5 (A) 06/01/2017 1108   PROTEINUR >=300 (A) 06/01/2017 1108   UROBILINOGEN 1.0 12/02/2013 1612   NITRITE NEGATIVE 06/01/2017 1108   LEUKOCYTESUR NEGATIVE 06/01/2017 1108    Radiological Exams on Admission: Dg Chest Portable 1 View  Result Date: 12/26/2017 CLINICAL DATA:  Initial evaluation for acute chest pain. EXAM: PORTABLE CHEST 1 VIEW COMPARISON:  Prior radiograph from 02/17/2017 FINDINGS: Cardiomegaly, stable. Mediastinal silhouette within normal limits. Lungs mildly hypoinflated. Diffuse vascular congestion with interstitial prominence, compatible with pulmonary interstitial edema. Asymmetric hazy opacity within the right lung base could reflect asymmetric edema or possibly infiltrate. No appreciable pleural effusion. No pneumothorax. No acute osseus abnormality. IMPRESSION: 1. Cardiomegaly with mild diffuse pulmonary interstitial edema. 2. Superimposed asymmetric hazy opacity within  the right lung base, which could reflect asymmetric edema or possibly infiltrate. Electronically Signed   By: Jeannine Boga M.D.   On: 12/26/2017 04:13    EKG: Independently reviewed.  Normal sinus rhythm, normal axis, normal intervals.  Assessment/Plan Active Problems:   Acute CHF (congestive heart failure) (HCC)   Heart failure ([place])  67 year old female who presents with a 10-day history of worsening dyspnea, associated with lower extremity edema and a 14 pound weight gain.  Her symptoms were progressive to the point where she became hypoxic.  On the initial physical examination blood pressure 155/66, heart rate 74, respiratory rate 14, oxygen  saturation 95% on 2 L per nasal cannula.  Moist mucous membranes, lungs with diffuse rales bilaterally, heart S1-S2 present and rhythmic, abdomen is soft nontender, positive nonpitting bilateral extremity edema 2+.  Sodium 139, potassium 4.7, chloride 102, bicarb 28, glucose 252, BUN 27, creatinine 1.0, BNP 714, white count 7.4, hemoglobin 9.3, hematocrit 34.7, platelets 205.  Chest radiograph, left rotation, cardiomegaly, increased interstitial markings bilaterally at bases, predominantly at the right side, positive cephalization of the vasculature and peribronchial cuffing.  Patient will be admitted to the hospital with a working diagnosis of acute hypoxic respiratory failure due to cardiogenic pulmonary edema due to diastolic heart failure decompensation.  1.  Acute hypoxic respiratory failure due to cardiogenic pulmonary edema due to diastolic heart failure exacerbation.  Patient will be admitted to the medical ward with a remote telemetry monitor, she will be diuresed with IV furosemide 40 mg twice daily to target negative fluid balance, will continue blood pressure control with amlodipine, atenolol and lisinopril.  Strict ins and outs along with daily weights.  Further work-up with echocardiography.  Her EKG has no ischemic features, and patient  denies any frank angina.  Unclear the triggering event for decompensation, it is possible that the local steroid injection is partially involved on extra fluid retention.   2.  Hypertension.  Continue blood pressure control with amlodipine, lisinopril and atenolol.  Continue blood pressure monitoring.  Patient will be diuresed with furosemide intravenously.  3.  Diabetes mellitus.  We will continue glucose coverage and monitoring with insulin sliding scale.  Patient at home on Lantus 20 units in a.m. 50 units in p.m. will use a lower dose while hospitalized prevent hypoglycemia.  4.  Chronic back pain.  Tinea analgesics with oxycodone/acetaminophen as needed.  Continue pregabalin.  5.  Hypothyroidism.  Continue levothyroxine.  6.  Positive d-dimer.  Currently pretest probability is low, patient difficult IV access for CT scan, it is recommended to treat pulmonary edema before VQ scan imaging, in the short-term will do ultrasonography lower extremities.   DVT prophylaxis:  Enoxaparin  Code Status:  full  Family Communication: no family at the bedside   Disposition Plan: telemetry   Consults called: none 1  Admission status: Inpatient.     Mauricio Gerome Apley MD [facility] Pager [phone]  If 7PM-7AM, please contact night-coverage [email or web address] Password [email or web address]  12/26/2017, 8:26 AM

## 2017-12-26 NOTE — ED Notes (Signed)
ED TO INPATIENT HANDOFF REPORT  Name/Age/Gender Andrea [name] 67 y.o. female  Code Status Code Status History    Date Active Date Inactive Code Status Order ID Comments User Context   06/01/2017 1736 06/05/2017 2122 Full Code [id number]  Charlynne Cousins, MD Inpatient   03/17/2017 1521 03/20/2017 2043 Full Code [id number]  Phillips Grout, MD ED   02/17/2017 1927 02/24/2017 1747 Full Code [id number]  Elwin Mocha, MD ED   01/08/2017 0354 01/12/2017 1833 Full Code [id number]  Vianne Bulls, MD ED   11/23/2016 0527 11/24/2016 2327 Full Code [id number]  Jani Gravel, MD Inpatient   11/28/2013 1828 12/06/2013 2058 Full Code [id number]  Coralie Keens, MD Inpatient      Home/SNF/Other Home  Chief Complaint shortness of breath  Level of Care/Admitting Diagnosis ED Disposition    ED Disposition Condition [place] [facility] Area: [facility] [[id number]]  Level of Care: Telemetry [5]  Admit to tele based on following criteria: Acute CHF  Diagnosis: Heart failure [facility]) [[id number]]  Admitting Physician: Tawni Millers [[id number]]  Attending Physician: Tawni Millers [[id number]]  Estimated length of stay: 3 - 4 days  Certification:: I certify this patient will need inpatient services for at least 2 midnights  PT Class (Do Not Modify): Inpatient [101]  PT Acc Code (Do Not Modify): Private [1]       Medical History Past Medical History:  Diagnosis Date  . Breast cancer ([place])    bilat mastectomy and LUE lymphadenectomy 2014, chemoRx 2014 - 15  . Diabetes mellitus without complication ([place])   . Esophageal reflux   . Hypertension   . Hypothyroid   . Migraine   . Seizure ([place])     Allergies Allergies  Allergen Reactions  . Aspirin Nausea And Vomiting  . Maxzide [Hydrochlorothiazide W-Triamterene] Swelling    Fluid retention  . Metformin And Related Diarrhea  . Nsaids Nausea And Vomiting  . Other Other (See Comments)    All steroids  produce psychosis per pt Artificial sweeteners produce nausea and upset stomach.  Gregary Cromer [Pravastatin] Other (See Comments)    unknown  . Stadol [Butorphanol] Other (See Comments)    Toradol, etc And related- hallucinations  . Toradol [Ketorolac Tromethamine] Other (See Comments)    hallucinations  . Vistaril [Hydroxyzine Hcl] Other (See Comments)    unknown  . Erythromycin Itching and Rash  . Morphine And Related Hives and Rash    Iv morphine.  . Penicillins Hives, Itching and Rash    Has patient had a PCN reaction causing immediate rash, facial/tongue/throat swelling, SOB or lightheadedness with hypotension: yes Has patient had a PCN reaction causing severe rash involving mucus membranes or skin necrosis: unknown Has patient had a PCN reaction that required hospitalization : unknown Has patient had a PCN reaction occurring within the last 10 years: no If all of the above answers are "NO", then may proceed with Cephalosporin use.     IV Location/Drains/Wounds Patient Lines/Drains/Airways Status   Active Line/Drains/Airways    Name:   Placement date:   Placement time:   Site:   Days:   Peripheral IV 12/26/17 Right;Lateral Forearm   12/26/17    0505    Forearm   less than 1   External Urinary Catheter   06/01/17    1755    -   208   Wound / Incision (Open or Dehisced) 06/01/17 Laceration Knee Left dried abrasion   06/01/17  1800    Knee   208          Labs/Imaging Results for orders placed or performed during the hospital encounter of 12/26/17 (from the past 48 hour(s))  Basic metabolic panel     Status: Abnormal   Collection Time: 12/26/17  5:04 AM  Result Value Ref Range   Sodium 139 135 - 145 mmol/L   Potassium 4.7 3.5 - 5.1 mmol/L    Comment: SLIGHT HEMOLYSIS   Chloride 102 98 - 111 mmol/L   CO2 28 22 - 32 mmol/L   Glucose, Bld 252 (H) 70 - 99 mg/dL   BUN 27 (H) 8 - 23 mg/dL   Creatinine, Ser 1.04 (H) 0.44 - 1.00 mg/dL   Calcium 8.9 8.9 - 10.3 mg/dL   GFR  calc non Af Amer 56 (L) >60 mL/min   GFR calc Af Amer >60 >60 mL/min   Anion gap 9 5 - 15    Comment: Performed at [facility], [place] [address] [place], [place] [address]  CBC with Differential     Status: Abnormal   Collection Time: 12/26/17  5:04 AM  Result Value Ref Range   WBC 7.4 4.0 - 10.5 K/uL   RBC 3.74 (L) 3.87 - 5.11 MIL/uL   Hemoglobin 11.3 (L) 12.0 - 15.0 g/dL   HCT 34.7 (L) 36.0 - 46.0 %   MCV 92.8 80.0 - 100.0 fL   MCH 30.2 26.0 - 34.0 pg   MCHC 32.6 30.0 - 36.0 g/dL   RDW 12.9 11.5 - 15.5 %   Platelets 205 150 - 400 K/uL   nRBC 0.0 0.0 - 0.2 %   Neutrophils Relative % 63 %   Neutro Abs 4.6 1.7 - 7.7 K/uL   Lymphocytes Relative 25 %   Lymphs Abs 1.9 0.7 - 4.0 K/uL   Monocytes Relative 10 %   Monocytes Absolute 0.8 0.1 - 1.0 K/uL   Eosinophils Relative 2 %   Eosinophils Absolute 0.2 0.0 - 0.5 K/uL   Basophils Relative 0 %   Basophils Absolute 0.0 0.0 - 0.1 K/uL   Immature Granulocytes 0 %   Abs Immature Granulocytes 0.01 0.00 - 0.07 K/uL    Comment: Performed at [facility], [place] [address] [place], [place] [address]  Brain natriuretic peptide     Status: Abnormal   Collection Time: 12/26/17  5:04 AM  Result Value Ref Range   B Natriuretic Peptide 714.4 (H) 0.0 - 100.0 pg/mL    Comment: Performed at [facility], [place] [address] [place], [place] [address]  D-dimer, quantitative (not at [facility])     Status: Abnormal   Collection Time: 12/26/17  5:04 AM  Result Value Ref Range   D-Dimer, Quant 1.91 (H) 0.00 - 0.50 ug/mL-FEU    Comment: (NOTE) At the manufacturer cut-off of 0.50 ug/mL FEU, this assay has been documented to exclude PE with a sensitivity and negative predictive value of 97 to 99%.  At this time, this assay has not been approved by the FDA to exclude DVT/VTE. Results should be correlated with clinical presentation. Performed at [facility], [place] [address] [place], [place] [address]   I-stat troponin, ED     Status: None   Collection Time: 12/26/17  5:18 AM  Result Value Ref Range   Troponin i, poc 0.02 0.00 - 0.08 ng/mL   Comment 3            Comment: Due  to the release kinetics of cTnI, a negative result within the first hours of the onset of symptoms does not rule out myocardial infarction with certainty. If myocardial infarction is still suspected, repeat the test at appropriate intervals.    Dg Chest Portable 1 View  Result Date: 12/26/2017 CLINICAL DATA:  Initial evaluation for acute chest pain. EXAM: PORTABLE CHEST 1 VIEW COMPARISON:  Prior radiograph from 02/17/2017 FINDINGS: Cardiomegaly, stable. Mediastinal silhouette within normal limits. Lungs mildly hypoinflated. Diffuse vascular congestion with interstitial prominence, compatible with pulmonary interstitial edema. Asymmetric hazy opacity within the right lung base could reflect asymmetric edema or possibly infiltrate. No appreciable pleural effusion. No pneumothorax. No acute osseus abnormality. IMPRESSION: 1. Cardiomegaly with mild diffuse pulmonary interstitial edema. 2. Superimposed asymmetric hazy opacity within the right lung base, which could reflect asymmetric edema or possibly infiltrate. Electronically Signed   By: Jeannine Boga M.D.   On: 12/26/2017 04:13   EKG Interpretation  Date/Time:  Friday December 26 2017 04:26:52 EST Ventricular Rate:  88 PR Interval:    QRS Duration: 82 QT Interval:  383 QTC Calculation: 464 R Axis:   65 Text Interpretation:  Sinus rhythm Anteroseptal infarct, old Nonspecific T abnormalities, lateral leads No significant change since last tracing Confirmed by Pryor Curia [phone]) on 12/26/2017 4:30:25 AM Also confirmed by Ward, Cyril Mourning [phone]), editor Philomena Doheny [phone])  on 12/26/2017 6:59:39 AM   Pending Labs Unresulted Labs (From admission, onward)    Start     Ordered   Signed and Held  HIV antibody (Routine Testing)   Once,   R    Question:  Specimen collection method  Answer:  IV Team=IV Team collect   Signed and Held   Signed and Held  CBC  (enoxaparin (LOVENOX)    CrCl >/= 30 ml/min)  Once,   R    Comments:  Baseline for enoxaparin therapy IF NOT ALREADY DRAWN.  Notify MD if PLT < 100 K.   Question:  Specimen collection method  Answer:  IV Team=IV Team collect   Signed and Held   Signed and Held  Creatinine, serum  (enoxaparin (LOVENOX)    CrCl >/= 30 ml/min)  Once,   R    Comments:  Baseline for enoxaparin therapy IF NOT ALREADY DRAWN.   Question:  Specimen collection method  Answer:  IV Team=IV Team collect   Signed and Held   Signed and Held  Creatinine, serum  (enoxaparin (LOVENOX)    CrCl >/= 30 ml/min)  Weekly,   R    Comments:  while on enoxaparin therapy   Question:  Specimen collection method  Answer:  IV Team=IV Team collect   Signed and Held   Signed and Held  Basic metabolic panel  Tomorrow morning,   R    Question:  Specimen collection method  Answer:  IV Team=IV Team collect   Signed and Held   Signed and Held  CBC  Tomorrow morning,   R    Question:  Specimen collection method  Answer:  IV Team=IV Team collect   Signed and Held          Vitals/Pain Today's Vitals   12/26/17 0600 12/26/17 0700 12/26/17 0727 12/26/17 0800  BP: (!) 167/68 (!) 156/72  (!) 155/66  Pulse: 82 78  74  Resp: 12 11  14   Temp:      TempSrc:      SpO2: 97% 96%  95%  PainSc:   0-No pain     Isolation  Precautions No active isolations  Medications Medications  ipratropium-albuterol (DUONEB) 0.5-2.5 (3) MG/3ML nebulizer solution 3 mL (3 mLs Nebulization Given 12/26/17 0434)  ALPRAZolam (XANAX) tablet 0.5 mg (0.5 mg Oral Given 12/26/17 0454)  furosemide (LASIX) injection 20 mg (20 mg Intravenous Given 12/26/17 0618)    Mobility walks

## 2017-12-26 NOTE — Progress Notes (Signed)
  Echocardiogram 2D Echocardiogram has been performed.  Andrea [name] 12/26/2017, 2:45 PM

## 2017-12-26 NOTE — Progress Notes (Signed)
LE venous duplex       has been completed. Preliminary results can be found under CV proc through chart review. Aveyah Greenwood Eunice, RDMS, RVT   

## 2017-12-26 NOTE — ED Notes (Signed)
Patient ambulated to an from bathroom with minimal assistance. SpO2 91% on RA, patient placed on 2 L of O2 via [place].

## 2017-12-26 NOTE — ED Notes (Addendum)
DR. Cathlean Sauer Provider at bedside. PER LORI C. FROM CT

## 2017-12-26 NOTE — ED Notes (Signed)
Bed: WA20 Expected date:  Expected time:  Means of arrival:  Comments: EMS 67 yo female from home/SOB x 3 hours exp wheezing upper lobes-diminished lower lobes O2sat 88%-O2 6l?[place]-neb tx in progress

## 2017-12-26 NOTE — ED Notes (Signed)
IV team at bedside 

## 2017-12-27 LAB — BASIC METABOLIC PANEL
Anion gap: 8 (ref 5–15)
BUN: 28 mg/dL — AB (ref 8–23)
CO2: 31 mmol/L (ref 22–32)
Calcium: 8.5 mg/dL — ABNORMAL LOW (ref 8.9–10.3)
Chloride: 103 mmol/L (ref 98–111)
Creatinine, Ser: 1.16 mg/dL — ABNORMAL HIGH (ref 0.44–1.00)
GFR calc Af Amer: 57 mL/min — ABNORMAL LOW (ref >60)
GFR calc non Af Amer: 49 mL/min — ABNORMAL LOW (ref >60)
GLUCOSE: 133 mg/dL — AB (ref 70–99)
Potassium: 4 mmol/L (ref 3.5–5.1)
Sodium: 142 mmol/L (ref 135–145)

## 2017-12-27 LAB — HIV ANTIBODY (ROUTINE TESTING W REFLEX): HIV Screen 4th Generation wRfx: NONREACTIVE

## 2017-12-27 LAB — GLUCOSE, CAPILLARY
Glucose-Capillary: 133 mg/dL — ABNORMAL HIGH (ref 70–99)
Glucose-Capillary: 195 mg/dL — ABNORMAL HIGH (ref 70–99)
Glucose-Capillary: 144 mg/dL — ABNORMAL HIGH (ref 70–99)
Glucose-Capillary: 238 mg/dL — ABNORMAL HIGH (ref 70–99)

## 2017-12-27 LAB — CBC
HCT: 31.6 % — ABNORMAL LOW (ref 36.0–46.0)
Hemoglobin: 10.1 g/dL — ABNORMAL LOW (ref 12.0–15.0)
MCH: 30 pg (ref 26.0–34.0)
MCHC: 32 g/dL (ref 30.0–36.0)
MCV: 93.8 fL (ref 80.0–100.0)
Platelets: 185 10*3/uL (ref 150–400)
RBC: 3.37 MIL/uL — ABNORMAL LOW (ref 3.87–5.11)
RDW: 12.9 % (ref 11.5–15.5)
WBC: 6.1 10*3/uL (ref 4.0–10.5)
nRBC: 0 % (ref 0.0–0.2)

## 2017-12-27 MED ORDER — FUROSEMIDE 10 MG/ML IJ SOLN
40.0000 mg | Freq: Two times a day (BID) | INTRAMUSCULAR | Status: DC
  Administered 2017-12-27 – 2017-12-28 (×2): 40 mg via INTRAVENOUS
  Filled 2017-12-27 (×2): qty 4

## 2017-12-27 MED ORDER — INSULIN ASPART 100 UNIT/ML ~~LOC~~ SOLN
4.0000 [IU] | Freq: Three times a day (TID) | SUBCUTANEOUS | Status: DC
  Administered 2017-12-28 (×3): 4 [IU] via SUBCUTANEOUS

## 2017-12-27 MED ORDER — INSULIN ASPART 100 UNIT/ML ~~LOC~~ SOLN
0.0000 [IU] | Freq: Three times a day (TID) | SUBCUTANEOUS | Status: DC
  Administered 2017-12-27: 2 [IU] via SUBCUTANEOUS
  Administered 2017-12-27: 3 [IU] via SUBCUTANEOUS
  Administered 2017-12-27: 2 [IU] via SUBCUTANEOUS
  Administered 2017-12-28 – 2017-12-30 (×3): 3 [IU] via SUBCUTANEOUS
  Administered 2017-12-30: 13:00:00 2 [IU] via SUBCUTANEOUS
  Administered 2017-12-30: 5 [IU] via SUBCUTANEOUS
  Administered 2017-12-31: 3 [IU] via SUBCUTANEOUS

## 2017-12-27 MED ORDER — SACUBITRIL-VALSARTAN 24-26 MG PO TABS
1.0000 | ORAL_TABLET | Freq: Two times a day (BID) | ORAL | Status: DC
  Filled 2017-12-27: qty 1

## 2017-12-27 MED ORDER — CARVEDILOL 3.125 MG PO TABS
6.2500 mg | ORAL_TABLET | Freq: Two times a day (BID) | ORAL | Status: DC
  Administered 2017-12-27 – 2017-12-31 (×9): 6.25 mg via ORAL
  Filled 2017-12-27 (×5): qty 1
  Filled 2017-12-27 (×2): qty 2
  Filled 2017-12-27 (×2): qty 1

## 2017-12-27 MED ORDER — MORPHINE SULFATE 15 MG PO TABS
15.0000 mg | ORAL_TABLET | Freq: Once | ORAL | Status: AC
  Administered 2017-12-27: 15 mg via ORAL
  Filled 2017-12-27: qty 1

## 2017-12-27 MED ORDER — INSULIN ASPART 100 UNIT/ML ~~LOC~~ SOLN
0.0000 [IU] | Freq: Every day | SUBCUTANEOUS | Status: DC
  Administered 2017-12-27: 2 [IU] via SUBCUTANEOUS
  Administered 2017-12-28: 3 [IU] via SUBCUTANEOUS

## 2017-12-27 MED ORDER — FUROSEMIDE 10 MG/ML IJ SOLN
20.0000 mg | Freq: Two times a day (BID) | INTRAMUSCULAR | Status: DC
  Administered 2017-12-27: 20 mg via INTRAVENOUS
  Filled 2017-12-27: qty 2

## 2017-12-27 MED ORDER — MORPHINE SULFATE 15 MG PO TABS
15.0000 mg | ORAL_TABLET | ORAL | Status: DC | PRN
  Administered 2017-12-27 – 2017-12-31 (×16): 15 mg via ORAL
  Filled 2017-12-27 (×16): qty 1

## 2017-12-27 NOTE — Progress Notes (Signed)
Alger Memos, NP on call, made aware of patient's request for breakthrough pain medication. Patient has chronic pain which is not being controlled by her current regimen. Will carry out any new orders placed. Carnella Guadalaj[place] I

## 2017-12-27 NOTE — Progress Notes (Signed)
[facility] PROGRESS NOTE    Progress Note  [name]  LEX:[id number] DOB: 1950/05/25 DOA: 12/26/2017 PCP: Medicine, [facility]     Brief Narrative:   Andrea [name] is an 67 y.o. female past medical history of diabetes mellitus type 2, hypertension history of breast cancer comes in for dyspnea on exertion progressively getting worse associated with chest tightness and a 14 pound weight gain with lower extremity edema for more than 10 days.  Assessment/Plan:   Acute systolic CHF (congestive heart failure) ([place]): Relates a runny nose was with a sore throat about 2 weeks since then she has been feeling short of breath not able to lay flat and a 14 pound weight gain. 2D echo was done that showed an EF of 30%.  Diffuse hypokinesia no wall motion abnormality. She will start on IV Lasix, discontinue lisinopril atenolol and Norvasc. Start her on Coreg, she already received a dose of lisinopril yesterday night so we will have to be 36 hours off lisinopril in order to start Entresto. EKG showed no signs of ischemia.  Essential Hypertension Blood pressure continues to be high. Discontinue bloated pain lisinopril and atenolol. Start on oral Coreg, continue Lasix. Start [employer].  Diabetes mellitus without complication (HCC) Cont long-acting insulin plus sliding scale.  DVT prophylaxis: lovenox Family Communication:none Disposition Plan/Barrier to D/C: once work up for new HF completed and fully diuresed. Code Status:     Code Status Orders  (From admission, onward)         Start     Ordered   12/26/17 0936  Full code  Continuous     12/26/17 0936        Code Status History    Date Active Date Inactive Code Status Order ID Comments User Context   06/01/2017 1736 06/05/2017 2122 Full Code [id number]  Charlynne Cousins, MD Inpatient   03/17/2017 1521 03/20/2017 2043 Full Code [id number]  Phillips Grout, MD ED   02/17/2017 1927  02/24/2017 1747 Full Code [id number]  Elwin Mocha, MD ED   01/08/2017 0354 01/12/2017 1833 Full Code [id number]  Vianne Bulls, MD ED   11/23/2016 0527 11/24/2016 2327 Full Code [id number]  Jani Gravel, MD Inpatient   11/28/2013 1828 12/06/2013 2058 Full Code [id number]  Coralie Keens, MD Inpatient        IV Access:    Peripheral IV   Procedures and diagnostic studies:   Dg Chest Portable 1 View  Result Date: 12/26/2017 CLINICAL DATA:  Initial evaluation for acute chest pain. EXAM: PORTABLE CHEST 1 VIEW COMPARISON:  Prior radiograph from 02/17/2017 FINDINGS: Cardiomegaly, stable. Mediastinal silhouette within normal limits. Lungs mildly hypoinflated. Diffuse vascular congestion with interstitial prominence, compatible with pulmonary interstitial edema. Asymmetric hazy opacity within the right lung base could reflect asymmetric edema or possibly infiltrate. No appreciable pleural effusion. No pneumothorax. No acute osseus abnormality. IMPRESSION: 1. Cardiomegaly with mild diffuse pulmonary interstitial edema. 2. Superimposed asymmetric hazy opacity within the right lung base, which could reflect asymmetric edema or possibly infiltrate. Electronically Signed   By: Jeannine Boga M.D.   On: 12/26/2017 04:13   Vas [place] Lower Extremity Venous (dvt)  Result Date: 12/26/2017  Lower Venous Study Indications: Edema.  Limitations: Poor ultrasound/tissue interface and acoustic shadowing. Performing Technologist: Landry Mellow RDMS, RVT  Examination Guidelines: A complete evaluation includes B-mode imaging, spectral Doppler, color Doppler, and power Doppler as needed of all accessible portions of each vessel. Bilateral testing is considered an integral part of  a complete examination. Limited examinations for reoccurring indications may be performed as noted.  Right Venous Findings: +---------+---------------+---------+-----------+----------+-------+           CompressibilityPhasicitySpontaneityPropertiesSummary +---------+---------------+---------+-----------+----------+-------+ CFV      Full           Yes      Yes                          +---------+---------------+---------+-----------+----------+-------+ SFJ      Full                                                 +---------+---------------+---------+-----------+----------+-------+ FV Prox  Full                                                 +---------+---------------+---------+-----------+----------+-------+ FV Mid   Full                                                 +---------+---------------+---------+-----------+----------+-------+ FV DistalFull                                                 +---------+---------------+---------+-----------+----------+-------+ PFV      Full                                                 +---------+---------------+---------+-----------+----------+-------+ POP      Full           Yes      Yes                          +---------+---------------+---------+-----------+----------+-------+ PTV      Full                                                 +---------+---------------+---------+-----------+----------+-------+ PERO     Full                                                 +---------+---------------+---------+-----------+----------+-------+  Left Venous Findings: +---------+---------------+---------+-----------+----------+-------+          CompressibilityPhasicitySpontaneityPropertiesSummary +---------+---------------+---------+-----------+----------+-------+ CFV      Full           Yes      Yes                          +---------+---------------+---------+-----------+----------+-------+ SFJ      Full                                                 +---------+---------------+---------+-----------+----------+-------+  FV Prox  Full                                                  +---------+---------------+---------+-----------+----------+-------+ FV Mid   Full                                                 +---------+---------------+---------+-----------+----------+-------+ FV DistalFull                                                 +---------+---------------+---------+-----------+----------+-------+ PFV      Full                                                 +---------+---------------+---------+-----------+----------+-------+ POP      Full           Yes      Yes                          +---------+---------------+---------+-----------+----------+-------+ PTV      Full                                                 +---------+---------------+---------+-----------+----------+-------+ PERO     Full                                                 +---------+---------------+---------+-----------+----------+-------+    Summary: Right: There is no evidence of deep vein thrombosis in the lower extremity. No cystic structure found in the popliteal fossa. Left: There is no evidence of deep vein thrombosis in the lower extremity. No cystic structure found in the popliteal fossa.  *See table(s) above for measurements and observations.    Preliminary      Medical Consultants:    None.  Anti-Infectives:   None  Subjective:    Shyanna Klingel relates her breathing is better.  Objective:    Vitals:   12/26/17 1540 12/26/17 2034 12/27/17 0459 12/27/17 0637  BP: (!) 149/72 (!) 148/69 140/71   Pulse: 64 69 66   Resp: 18 18 18    Temp: 98.1 F (36.7 C) 98.1 F (36.7 C) 98.2 F (36.8 C)   TempSrc: Oral Oral    SpO2: 100% 96% 95%   Weight: 76.9 kg   74.3 kg  Height: 5\' 5"  (1.651 m)       Intake/Output Summary (Last 24 hours) at 12/27/2017 0814 Last data filed at 12/27/2017 0600 Gross per 24 hour  Intake 240 ml  Output 750 ml  Net -510 ml   Filed Weights   12/26/17 1540 12/27/17 0637  Weight: 76.9 kg 74.3 kg     Exam: General exam: In no  acute distress. Respiratory system: Good air movement and clear to auscultation. Cardiovascular system: S1 & S2 heard, RRR. No JVD. Gastrointestinal system: Abdomen is nondistended, soft and nontender.  Central nervous system: Alert and oriented. No focal neurological deficits. Extremities: Trace edema Skin: No rashes, lesions or ulcers Psychiatry: Judgement and insight appear normal. Mood & affect appropriate.    Data Reviewed:    Labs: Basic Metabolic Panel: Recent Labs  Lab 12/26/17 0504 12/26/17 1201 12/27/17 0411  NA 139  --  142  K 4.7  --  4.0  CL 102  --  103  CO2 28  --  31  GLUCOSE 252*  --  133*  BUN 27*  --  28*  CREATININE 1.04* 0.97 1.16*  CALCIUM 8.9  --  8.5*   GFR Estimated Creatinine Clearance: 48.1 mL/min (A) (by C-G formula based on SCr of 1.16 mg/dL (H)). Liver Function Tests: No results for input(s): AST, ALT, ALKPHOS, BILITOT, PROT, ALBUMIN in the last 168 hours. No results for input(s): LIPASE, AMYLASE in the last 168 hours. No results for input(s): AMMONIA in the last 168 hours. Coagulation profile No results for input(s): INR, PROTIME in the last 168 hours.  CBC: Recent Labs  Lab 12/26/17 0504 12/26/17 1201 12/27/17 0411  WBC 7.4 6.7 6.1  NEUTROABS 4.6  --   --   HGB 11.3* 10.5* 10.1*  HCT 34.7* 32.8* 31.6*  MCV 92.8 92.7 93.8  PLT 205 190 185   Cardiac Enzymes: No results for input(s): CKTOTAL, CKMB, CKMBINDEX, TROPONINI in the last 168 hours. BNP (last 3 results) No results for input(s): PROBNP in the last 8760 hours. CBG: Recent Labs  Lab 12/26/17 1206 12/26/17 1838 12/26/17 2033 12/27/17 0722  GLUCAP 178* 133* 193* 133*   D-Dimer: Recent Labs    12/26/17 0504  DDIMER 1.91*   Hgb A1c: No results for input(s): HGBA1C in the last 72 hours. Lipid Profile: No results for input(s): CHOL, HDL, LDLCALC, TRIG, CHOLHDL, LDLDIRECT in the last 72 hours. Thyroid function studies: No results  for input(s): TSH, T4TOTAL, T3FREE, THYROIDAB in the last 72 hours.  Invalid input(s): FREET3 Anemia work up: No results for input(s): VITAMINB12, FOLATE, FERRITIN, TIBC, IRON, RETICCTPCT in the last 72 hours. Sepsis Labs: Recent Labs  Lab 12/26/17 0504 12/26/17 1201 12/27/17 0411  WBC 7.4 6.7 6.1   Microbiology No results found for this or any previous visit (from the past 240 hour(s)).   Medications:   . amLODipine  10 mg Oral Daily  . atenolol  50 mg Oral BID  . enoxaparin (LOVENOX) injection  40 mg Subcutaneous Q24H  . furosemide  40 mg Intravenous Q12H  . insulin aspart  0-9 Units Subcutaneous TID WC  . insulin glargine  20 Units Subcutaneous Q2200  . levothyroxine  200 mcg Oral Q0600  . lisinopril  10 mg Oral QHS  . pregabalin  75 mg Oral BID   Continuous Infusions:    LOS: 1 day   Charlynne Cousins  [facility]   *Please refer to [email or web address], password [email or web address] to get updated schedule on who will round on this patient, as hospitalists switch teams weekly. If 7PM-7AM, please contact night-coverage at [email or web address], password [email or web address] for any overnight needs.  12/27/2017, 8:14 AM

## 2017-12-28 DIAGNOSIS — I10 Essential (primary) hypertension: Secondary | ICD-10-CM

## 2017-12-28 DIAGNOSIS — I5021 Acute systolic (congestive) heart failure: Secondary | ICD-10-CM

## 2017-12-28 LAB — BASIC METABOLIC PANEL
Anion gap: 9 (ref 5–15)
BUN: 29 mg/dL — ABNORMAL HIGH (ref 8–23)
CO2: 34 mmol/L — AB (ref 22–32)
Calcium: 8.6 mg/dL — ABNORMAL LOW (ref 8.9–10.3)
Chloride: 97 mmol/L — ABNORMAL LOW (ref 98–111)
Creatinine, Ser: 1.37 mg/dL — ABNORMAL HIGH (ref 0.44–1.00)
GFR calc Af Amer: 46 mL/min — ABNORMAL LOW (ref >60)
GFR calc non Af Amer: 40 mL/min — ABNORMAL LOW (ref >60)
Glucose, Bld: 156 mg/dL — ABNORMAL HIGH (ref 70–99)
Potassium: 4.5 mmol/L (ref 3.5–5.1)
Sodium: 140 mmol/L (ref 135–145)

## 2017-12-28 LAB — GLUCOSE, CAPILLARY
GLUCOSE-CAPILLARY: 173 mg/dL — AB (ref 70–99)
Glucose-Capillary: 199 mg/dL — ABNORMAL HIGH (ref 70–99)
Glucose-Capillary: 108 mg/dL — ABNORMAL HIGH (ref 70–99)
Glucose-Capillary: 269 mg/dL — ABNORMAL HIGH (ref 70–99)

## 2017-12-28 MED ORDER — SODIUM CHLORIDE 0.9% FLUSH: 3.0000 mL | INTRAVENOUS | Status: DC | PRN

## 2017-12-28 MED ORDER — SACUBITRIL-VALSARTAN 24-26 MG PO TABS
1.0000 | ORAL_TABLET | Freq: Two times a day (BID) | ORAL | Status: DC
  Administered 2017-12-28 – 2017-12-31 (×7): 1 via ORAL
  Filled 2017-12-28 (×7): qty 1

## 2017-12-28 MED ORDER — FUROSEMIDE 10 MG/ML IJ SOLN
40.0000 mg | Freq: Every day | INTRAMUSCULAR | Status: DC
  Administered 2017-12-29: 40 mg via INTRAVENOUS

## 2017-12-28 MED ORDER — SODIUM CHLORIDE 0.9 % IV SOLN
INTRAVENOUS | Status: DC
  Administered 2017-12-29: 07:00:00 via INTRAVENOUS

## 2017-12-28 MED ORDER — SODIUM CHLORIDE 0.9% FLUSH
3.0000 mL | Freq: Two times a day (BID) | INTRAVENOUS | Status: DC
  Administered 2017-12-28: 3 mL via INTRAVENOUS

## 2017-12-28 MED ORDER — SODIUM CHLORIDE 0.9 % IV SOLN: 250.0000 mL | INTRAVENOUS | Status: DC | PRN

## 2017-12-28 NOTE — Progress Notes (Addendum)
[facility] PROGRESS NOTE    Progress Note  Andrea [name]  KGM:[id number] DOB: 1950-07-20 DOA: 12/26/2017 PCP: Medicine, [facility]     Brief Narrative:   Andrea [name] is an 67 y.o. female past medical history of diabetes mellitus type 2, hypertension history of breast cancer comes in for dyspnea on exertion progressively getting worse associated with chest tightness and a 14 pound weight gain with lower extremity edema for more than 10 days.  Assessment/Plan:   Acute systolic CHF (congestive heart failure) ([place]): Relates a runny nose and a sore throat about 2 weeks ago, since then she has been feeling short of breath not able to lay flat, she relates a 14 pound weight gain. In February 2019 her weight was 67 kg, on admission is now 75 kg 2D echo was done that showed an EF of 30%.  Diffuse hypokinesia no wall motion abnormality. Blood pressure seems to be stable, there has been a mild rise in her creatinine. Continue Coreg will hold Delene Loll, will discuss with cardiology. Hold IV Lasix there has been a mild rise in her creatinine when she is about 2 L negative. She relates her breathing is better. Continue strict I's and O's Daily weights.  Essential Hypertension Pressure significantly improved on Coreg  Diabetes mellitus without complication (HCC) Cont long-acting insulin plus sliding scale.  DVT prophylaxis: lovenox Family Communication:none Disposition Plan/Barrier to D/C: once work up for new HF completed and fully diuresed. Code Status:     Code Status Orders  (From admission, onward)         Start     Ordered   12/26/17 0936  Full code  Continuous     12/26/17 0936        Code Status History    Date Active Date Inactive Code Status Order ID Comments User Context   06/01/2017 1736 06/05/2017 2122 Full Code [id number]  Charlynne Cousins, MD Inpatient   03/17/2017 1521 03/20/2017 2043 Full Code [id number]  Phillips Grout, MD ED     02/17/2017 1927 02/24/2017 1747 Full Code [id number]  Elwin Mocha, MD ED   01/08/2017 0354 01/12/2017 1833 Full Code [id number]  Vianne Bulls, MD ED   11/23/2016 0527 11/24/2016 2327 Full Code [id number]  Jani Gravel, MD Inpatient   11/28/2013 1828 12/06/2013 2058 Full Code [id number]  Coralie Keens, MD Inpatient        IV Access:    Peripheral IV   Procedures and diagnostic studies:   Vas [place] Lower Extremity Venous (dvt)  Result Date: 12/28/2017  Lower Venous Study Indications: Edema.  Limitations: Poor ultrasound/tissue interface and acoustic shadowing. Performing Technologist: Landry Mellow RDMS, RVT  Examination Guidelines: A complete evaluation includes B-mode imaging, spectral Doppler, color Doppler, and power Doppler as needed of all accessible portions of each vessel. Bilateral testing is considered an integral part of a complete examination. Limited examinations for reoccurring indications may be performed as noted.  Right Venous Findings: +---------+---------------+---------+-----------+----------+-------+          CompressibilityPhasicitySpontaneityPropertiesSummary +---------+---------------+---------+-----------+----------+-------+ CFV      Full           Yes      Yes                          +---------+---------------+---------+-----------+----------+-------+ SFJ      Full                                                 +---------+---------------+---------+-----------+----------+-------+  FV Prox  Full                                                 +---------+---------------+---------+-----------+----------+-------+ FV Mid   Full                                                 +---------+---------------+---------+-----------+----------+-------+ FV DistalFull                                                 +---------+---------------+---------+-----------+----------+-------+ PFV      Full                                                  +---------+---------------+---------+-----------+----------+-------+ POP      Full           Yes      Yes                          +---------+---------------+---------+-----------+----------+-------+ PTV      Full                                                 +---------+---------------+---------+-----------+----------+-------+ PERO     Full                                                 +---------+---------------+---------+-----------+----------+-------+  Left Venous Findings: +---------+---------------+---------+-----------+----------+-------+          CompressibilityPhasicitySpontaneityPropertiesSummary +---------+---------------+---------+-----------+----------+-------+ CFV      Full           Yes      Yes                          +---------+---------------+---------+-----------+----------+-------+ SFJ      Full                                                 +---------+---------------+---------+-----------+----------+-------+ FV Prox  Full                                                 +---------+---------------+---------+-----------+----------+-------+ FV Mid   Full                                                 +---------+---------------+---------+-----------+----------+-------+  FV DistalFull                                                 +---------+---------------+---------+-----------+----------+-------+ PFV      Full                                                 +---------+---------------+---------+-----------+----------+-------+ POP      Full           Yes      Yes                          +---------+---------------+---------+-----------+----------+-------+ PTV      Full                                                 +---------+---------------+---------+-----------+----------+-------+ PERO     Full                                                  +---------+---------------+---------+-----------+----------+-------+    Summary: Right: There is no evidence of deep vein thrombosis in the lower extremity. No cystic structure found in the popliteal fossa. Left: There is no evidence of deep vein thrombosis in the lower extremity. No cystic structure found in the popliteal fossa.  *See table(s) above for measurements and observations. Electronically signed by Harold Barban MD on 12/28/2017 at 8:26:03 AM.    Final      Medical Consultants:    None.  Anti-Infectives:   None  Subjective:    Andrea [name] relates her breathing is better.  Objective:    Vitals:   12/27/17 0637 12/27/17 1414 12/27/17 2134 12/28/17 0456  BP:  118/62 (!) 143/71 138/60  Pulse:  69 72 77  Resp:  16  18  Temp:  98.6 F (37 C) 98.9 F (37.2 C) 99.1 F (37.3 C)  TempSrc:  Oral Oral Oral  SpO2:  92% 94% 94%  Weight: 74.3 kg   75.1 kg  Height:        Intake/Output Summary (Last 24 hours) at 12/28/2017 0838 Last data filed at 12/28/2017 0532 Gross per 24 hour  Intake 1060 ml  Output 2400 ml  Net -1340 ml   Filed Weights   12/26/17 1540 12/27/17 0637 12/28/17 0456  Weight: 76.9 kg 74.3 kg 75.1 kg    Exam: General exam: In no acute distress. Respiratory system: Good air movement and clear to auscultation. Cardiovascular system: S1 & S2 heard, RRR. No JVD. Gastrointestinal system: Bowel sounds soft nondistended nontender. Central nervous system: Awake alert and oriented x3 nonfocal. Extremities: Trace edema Skin: No rashes, lesions or ulcers Psychiatry: And insight appear normal.   Data Reviewed:    Labs: Basic Metabolic Panel: Recent Labs  Lab 12/26/17 0504 12/26/17 1201 12/27/17 0411 12/28/17 0511  NA 139  --  142 140  K 4.7  --  4.0 4.5  CL 102  --  103 97*  CO2 28  --  31 34*  GLUCOSE 252*  --  133* 156*  BUN 27*  --  28* 29*  CREATININE 1.04* 0.97 1.16* 1.37*  CALCIUM 8.9  --  8.5* 8.6*   GFR Estimated Creatinine  Clearance: 40.9 mL/min (A) (by C-G formula based on SCr of 1.37 mg/dL (H)). Liver Function Tests: No results for input(s): AST, ALT, ALKPHOS, BILITOT, PROT, ALBUMIN in the last 168 hours. No results for input(s): LIPASE, AMYLASE in the last 168 hours. No results for input(s): AMMONIA in the last 168 hours. Coagulation profile No results for input(s): INR, PROTIME in the last 168 hours.  CBC: Recent Labs  Lab 12/26/17 0504 12/26/17 1201 12/27/17 0411  WBC 7.4 6.7 6.1  NEUTROABS 4.6  --   --   HGB 11.3* 10.5* 10.1*  HCT 34.7* 32.8* 31.6*  MCV 92.8 92.7 93.8  PLT 205 190 185   Cardiac Enzymes: No results for input(s): CKTOTAL, CKMB, CKMBINDEX, TROPONINI in the last 168 hours. BNP (last 3 results) No results for input(s): PROBNP in the last 8760 hours. CBG: Recent Labs  Lab 12/27/17 0722 12/27/17 1144 12/27/17 1740 12/27/17 2132 12/28/17 0732  GLUCAP 133* 195* 144* 238* 199*   D-Dimer: Recent Labs    12/26/17 0504  DDIMER 1.91*   Hgb A1c: No results for input(s): HGBA1C in the last 72 hours. Lipid Profile: No results for input(s): CHOL, HDL, LDLCALC, TRIG, CHOLHDL, LDLDIRECT in the last 72 hours. Thyroid function studies: No results for input(s): TSH, T4TOTAL, T3FREE, THYROIDAB in the last 72 hours.  Invalid input(s): FREET3 Anemia work up: No results for input(s): VITAMINB12, FOLATE, FERRITIN, TIBC, IRON, RETICCTPCT in the last 72 hours. Sepsis Labs: Recent Labs  Lab 12/26/17 0504 12/26/17 1201 12/27/17 0411  WBC 7.4 6.7 6.1   Microbiology No results found for this or any previous visit (from the past 240 hour(s)).   Medications:   . carvedilol  6.25 mg Oral BID WC  . enoxaparin (LOVENOX) injection  40 mg Subcutaneous Q24H  . furosemide  40 mg Intravenous BID  . insulin aspart  0-15 Units Subcutaneous TID WC  . insulin aspart  0-5 Units Subcutaneous QHS  . insulin aspart  4 Units Subcutaneous TID WC  . insulin glargine  20 Units Subcutaneous Q2200    . levothyroxine  200 mcg Oral Q0600  . pregabalin  75 mg Oral BID  . sacubitril-valsartan  1 tablet Oral BID   Continuous Infusions:    LOS: 2 days   Charlynne Cousins  [facility]   *Please refer to [email or web address], password [email or web address] to get updated schedule on who will round on this patient, as hospitalists switch teams weekly. If 7PM-7AM, please contact night-coverage at [email or web address], password [email or web address] for any overnight needs.  12/28/2017, 8:38 AM

## 2017-12-28 NOTE — Consult Note (Signed)
CARDIOLOGY CONSULT NOTE     Primary Care Physician: Medicine, [facility] Referring Physician:  Dr Olevia Bowens  Admit Date: 12/26/2017  Reason for consultation:  CHF  Andrea [name] is a 67 y.o. female with a h/o HTN, DM, prior breast CA who is admitted with CHF.  She reports progressive symptoms of SOB, chest tightness and edema over the  Past 10 days.  Her symptoms have become much worse over the past 2 days.  She presents with significant volume overload. She denies prior CV history.  No prior CHF symptoms.  Today, she denies symptoms of palpitations, chest pain, dizziness, presyncope, syncope, or neurologic sequela. The patient is tolerating medications without difficulties and is otherwise without complaint today.   Past Medical History:  Diagnosis Date  . Breast cancer ([place])    bilat mastectomy and LUE lymphadenectomy 2014, chemoRx 2014 - 15  . Diabetes mellitus without complication ([place])   . Esophageal reflux   . Hypertension   . Hypothyroid   . Migraine   . Seizure [facility])    Past Surgical History:  Procedure Laterality Date  . ABDOMINAL HYSTERECTOMY     1984 , done for ruptured cyst and endometriosis  . APPENDECTOMY    . CHOLECYSTECTOMY     1980's  . EYE SURGERY  08/28/2015   Right eye  . KNEE SURGERY Left   . MASTECTOMY     bilateral  . Open surgery for bowel obstruction, 2000's      . carvedilol  6.25 mg Oral BID WC  . enoxaparin (LOVENOX) injection  40 mg Subcutaneous Q24H  . insulin aspart  0-15 Units Subcutaneous TID WC  . insulin aspart  0-5 Units Subcutaneous QHS  . insulin aspart  4 Units Subcutaneous TID WC  . insulin glargine  20 Units Subcutaneous Q2200  . levothyroxine  200 mcg Oral Q0600  . pregabalin  75 mg Oral BID     Allergies  Allergen Reactions  . Aspirin Nausea And Vomiting  . Maxzide [Hydrochlorothiazide W-Triamterene] Swelling    Fluid retention  . Metformin And Related Diarrhea  . Nsaids Nausea And Vomiting  .  Other Other (See Comments)    All steroids produce psychosis per pt Artificial sweeteners produce nausea and upset stomach.  Gregary Cromer [Pravastatin] Other (See Comments)    unknown  . Stadol [Butorphanol] Other (See Comments)    Toradol, etc And related- hallucinations  . Toradol [Ketorolac Tromethamine] Other (See Comments)    hallucinations  . Vistaril [Hydroxyzine Hcl] Other (See Comments)    unknown  . Erythromycin Itching and Rash  . Morphine And Related Hives and Rash    Iv morphine.  . Penicillins Hives, Itching and Rash    Has patient had a PCN reaction causing immediate rash, facial/tongue/throat swelling, SOB or lightheadedness with hypotension: yes Has patient had a PCN reaction causing severe rash involving mucus membranes or skin necrosis: unknown Has patient had a PCN reaction that required hospitalization : unknown Has patient had a PCN reaction occurring within the last 10 years: no If all of the above answers are "NO", then may proceed with Cephalosporin use.     Social History   Socioeconomic History  . Marital status: Divorced    Spouse name: Not on file  . Number of children: Not on file  . Years of education: Not on file  . Highest education level: Not on file  Occupational History  . Not on file  Social Needs  . Emergency planning/management officer  strain: Not on file  . Food insecurity:    Worry: Not on file    Inability: Not on file  . Transportation needs:    Medical: Not on file    Non-medical: Not on file  Tobacco Use  . Smoking status: Never Smoker  . Smokeless tobacco: Never Used  Substance and Sexual Activity  . Alcohol use: No  . Drug use: No  . Sexual activity: Not Currently  Lifestyle  . Physical activity:    Days per week: Not on file    Minutes per session: Not on file  . Stress: Not on file  Relationships  . Social connections:    Talks on phone: Not on file    Gets together: Not on file    Attends religious service: Not on file    Active  member of club or organization: Not on file    Attends meetings of clubs or organizations: Not on file    Relationship status: Not on file  . Intimate partner violence:    Fear of current or ex partner: Not on file    Emotionally abused: Not on file    Physically abused: Not on file    Forced sexual activity: Not on file  Other Topics Concern  . Not on file  Social History Narrative  . Not on file    Family History  Problem Relation Age of Onset  . Sudden Cardiac Death Neg Hx   denies FH of CHF, CAD  ROS- All systems are reviewed and negative except as per the HPI above  Physical Exam: Telemetry: Vitals:   12/27/17 0637 12/27/17 1414 12/27/17 2134 12/28/17 0456  BP:  118/62 (!) 143/71 138/60  Pulse:  69 72 77  Resp:  16  18  Temp:  98.6 F (37 C) 98.9 F (37.2 C) 99.1 F (37.3 C)  TempSrc:  Oral Oral Oral  SpO2:  92% 94% 94%  Weight: 74.3 kg   75.1 kg  Height:        GEN- The patient is well appearing, alert and oriented x 3 today.   Head- normocephalic, atraumatic Eyes-  Sclera clear, conjunctiva pink Ears- hearing intact Oropharynx- clear Neck- supple, +JVP Lymph- no cervical lymphadenopathy Lungs- decreased BS at bases, normal work of breathing Heart- Regular rate and rhythm, no murmurs, rubs or gallops, PMI not laterally displaced GI- soft, NT, ND, + BS Extremities- no clubbing, cyanosis, +edema MS- no significant deformity or atrophy Skin- no rash or lesion Psych- euthymic mood, full affect Neuro- strength and sensation are intact  EKG: sinus rhythm, septal infarct, no acute changes  Labs:   Lab Results  Component Value Date   WBC 6.1 12/27/2017   HGB 10.1 (L) 12/27/2017   HCT 31.6 (L) 12/27/2017   MCV 93.8 12/27/2017   PLT 185 12/27/2017    Recent Labs  Lab 12/28/17 0511  NA 140  K 4.5  CL 97*  CO2 34*  BUN 29*  CREATININE 1.37*  CALCIUM 8.6*  GLUCOSE 156*   Lab Results  Component Value Date   CKTOTAL 179 01/10/2017   No results  found for: CHOL No results found for: HDL No results found for: LDLCALC No results found for: TRIG No results found for: CHOLHDL No results found for: LDLDIRECT    Radiology:  CXR is personally reviewed and reveals diffuse mild interstitial edema  Echo:  12/26/17 reveals EF 30%, mild LVH, diffuse HK, moderate LA enlargement  ASSESSMENT AND PLAN:   1.  Acute systolic dysfunction The patient presents with symptoms of CHF and is found to have significant volume overload.  She is also found to have newly depressed EF by echo of unclear etiology.  Further CV testing is required.  She has been initiated on coreg.  Start entresto which can be titrated as an outpatient.   Will treat with IV lasix today. Anticipate RHC/LHC tomorrow if schedule allows for further CV risk startification. Discussed the cath with the patient. The patient understands that risks included but are not limited to stroke (1 in 1000), death (1 in 73), kidney failure [usually temporary] (1 in 500), bleeding (1 in 200), allergic reaction [possibly serious] (1 in 200). The patient understands and agrees to proceed.   2. HTN Stable No change required today   Thompson Grayer, MD 12/28/2017  11:08 AM

## 2017-12-29 ENCOUNTER — Encounter (HOSPITAL_COMMUNITY)
Admission: EM | Disposition: A | Discharge status: Home / Self Care | Payer: Self-pay | Source: Home / Self Care | Attending: Internal Medicine

## 2017-12-29 DIAGNOSIS — I5041 Acute combined systolic (congestive) and diastolic (congestive) heart failure: Secondary | ICD-10-CM

## 2017-12-29 DIAGNOSIS — I2510 Atherosclerotic heart disease of native coronary artery without angina pectoris: Secondary | ICD-10-CM

## 2017-12-29 HISTORY — PX: RIGHT/LEFT HEART CATH AND CORONARY ANGIOGRAPHY: CATH118266

## 2017-12-29 LAB — BASIC METABOLIC PANEL
Anion gap: 8 (ref 5–15)
BUN: 27 mg/dL — ABNORMAL HIGH (ref 8–23)
CO2: 32 mmol/L (ref 22–32)
Calcium: 8.3 mg/dL — ABNORMAL LOW (ref 8.9–10.3)
Chloride: 101 mmol/L (ref 98–111)
Creatinine, Ser: 1.15 mg/dL — ABNORMAL HIGH (ref 0.44–1.00)
GFR calc Af Amer: 57 mL/min — ABNORMAL LOW (ref >60)
GFR calc non Af Amer: 50 mL/min — ABNORMAL LOW (ref >60)
Glucose, Bld: 141 mg/dL — ABNORMAL HIGH (ref 70–99)
POTASSIUM: 4.2 mmol/L (ref 3.5–5.1)
Sodium: 141 mmol/L (ref 135–145)

## 2017-12-29 LAB — GLUCOSE, CAPILLARY
Glucose-Capillary: 118 mg/dL — ABNORMAL HIGH (ref 70–99)
Glucose-Capillary: 136 mg/dL — ABNORMAL HIGH (ref 70–99)
Glucose-Capillary: 138 mg/dL — ABNORMAL HIGH (ref 70–99)
Glucose-Capillary: 175 mg/dL — ABNORMAL HIGH (ref 70–99)

## 2017-12-29 LAB — MAGNESIUM: Magnesium: 1.8 mg/dL (ref 1.7–2.4)

## 2017-12-29 LAB — POCT I-STAT 3, VENOUS BLOOD GAS (G3P V)
Acid-Base Excess: 8 mmol/L — ABNORMAL HIGH (ref 0.0–2.0)
Bicarbonate: 35.4 mmol/L — ABNORMAL HIGH (ref 20.0–28.0)
O2 Saturation: 60 %
PH VEN: 7.348 (ref 7.250–7.430)
TCO2: 37 mmol/L — ABNORMAL HIGH (ref 22–32)
pCO2, Ven: 64.4 mmHg — ABNORMAL HIGH (ref 44.0–60.0)
pO2, Ven: 34 mmHg (ref 32.0–45.0)

## 2017-12-29 LAB — POCT I-STAT 3, ART BLOOD GAS (G3+)
ACID-BASE EXCESS: 7 mmol/L — AB (ref 0.0–2.0)
Bicarbonate: 33.8 mmol/L — ABNORMAL HIGH (ref 20.0–28.0)
O2 Saturation: 93 %
TCO2: 36 mmol/L — ABNORMAL HIGH (ref 22–32)
pCO2 arterial: 59.9 mmHg — ABNORMAL HIGH (ref 32.0–48.0)
pH, Arterial: 7.359 (ref 7.350–7.450)
pO2, Arterial: 71 mmHg — ABNORMAL LOW (ref 83.0–108.0)

## 2017-12-29 LAB — TSH: TSH: 17.45 u[IU]/mL — ABNORMAL HIGH (ref 0.350–4.500)

## 2017-12-29 LAB — CARDIAC CATHETERIZATION: Cath EF Quantitative: 35 %

## 2017-12-29 SURGERY — RIGHT/LEFT HEART CATH AND CORONARY ANGIOGRAPHY
Anesthesia: LOCAL

## 2017-12-29 MED ORDER — VERAPAMIL HCL 2.5 MG/ML IV SOLN
INTRAVENOUS | Status: AC
  Filled 2017-12-29: qty 2

## 2017-12-29 MED ORDER — HEPARIN SODIUM (PORCINE) 1000 UNIT/ML IJ SOLN
INTRAMUSCULAR | Status: DC | PRN
  Administered 2017-12-29: 4000 [IU] via INTRAVENOUS

## 2017-12-29 MED ORDER — VERAPAMIL HCL 2.5 MG/ML IV SOLN
INTRAVENOUS | Status: DC | PRN
  Administered 2017-12-29: 15:00:00 via INTRA_ARTERIAL

## 2017-12-29 MED ORDER — FENTANYL CITRATE (PF) 100 MCG/2ML IJ SOLN
INTRAMUSCULAR | Status: AC
  Filled 2017-12-29: qty 2

## 2017-12-29 MED ORDER — SODIUM CHLORIDE 0.9 % WEIGHT BASED INFUSION: 1.0000 mL/kg/h | INTRAVENOUS | Status: AC

## 2017-12-29 MED ORDER — HYDRALAZINE HCL 20 MG/ML IJ SOLN
INTRAMUSCULAR | Status: AC
  Filled 2017-12-29: qty 1

## 2017-12-29 MED ORDER — FENTANYL CITRATE (PF) 100 MCG/2ML IJ SOLN
INTRAMUSCULAR | Status: DC | PRN
  Administered 2017-12-29: 25 ug via INTRAVENOUS

## 2017-12-29 MED ORDER — SODIUM CHLORIDE 0.9% FLUSH
3.0000 mL | Freq: Two times a day (BID) | INTRAVENOUS | Status: DC
  Administered 2017-12-30 (×2): 3 mL via INTRAVENOUS

## 2017-12-29 MED ORDER — HEPARIN (PORCINE) IN NACL 1000-0.9 UT/500ML-% IV SOLN
INTRAVENOUS | Status: DC | PRN
  Administered 2017-12-29: 500 mL

## 2017-12-29 MED ORDER — HEPARIN (PORCINE) IN NACL 1000-0.9 UT/500ML-% IV SOLN
INTRAVENOUS | Status: AC
  Filled 2017-12-29: qty 1000

## 2017-12-29 MED ORDER — HYDRALAZINE HCL 20 MG/ML IJ SOLN
10.0000 mg | Freq: Once | INTRAMUSCULAR | Status: AC
  Administered 2017-12-29: 10 mg via INTRAVENOUS

## 2017-12-29 MED ORDER — FUROSEMIDE 10 MG/ML IJ SOLN
INTRAMUSCULAR | Status: AC
  Filled 2017-12-29: qty 4

## 2017-12-29 MED ORDER — IOHEXOL 350 MG/ML SOLN
INTRAVENOUS | Status: DC | PRN
  Administered 2017-12-29: 70 mL via INTRA_ARTERIAL

## 2017-12-29 MED ORDER — ALPRAZOLAM 0.25 MG PO TABS
ORAL_TABLET | ORAL | Status: AC
  Filled 2017-12-29: qty 2

## 2017-12-29 MED ORDER — LIDOCAINE HCL (PF) 1 % IJ SOLN
INTRAMUSCULAR | Status: AC
  Filled 2017-12-29: qty 30

## 2017-12-29 MED ORDER — SODIUM CHLORIDE 0.9 % IV SOLN: 250.0000 mL | INTRAVENOUS | Status: DC | PRN

## 2017-12-29 MED ORDER — LIDOCAINE HCL (PF) 1 % IJ SOLN
INTRAMUSCULAR | Status: DC | PRN
  Administered 2017-12-29: 2 mL
  Administered 2017-12-29: 15 mL

## 2017-12-29 MED ORDER — HEPARIN SODIUM (PORCINE) 1000 UNIT/ML IJ SOLN
INTRAMUSCULAR | Status: AC
  Filled 2017-12-29: qty 1

## 2017-12-29 MED ORDER — MIDAZOLAM HCL 2 MG/2ML IJ SOLN
INTRAMUSCULAR | Status: DC | PRN
  Administered 2017-12-29: 1 mg via INTRAVENOUS

## 2017-12-29 MED ORDER — MIDAZOLAM HCL 2 MG/2ML IJ SOLN
INTRAMUSCULAR | Status: AC
  Filled 2017-12-29: qty 2

## 2017-12-29 MED ORDER — SODIUM CHLORIDE 0.9% FLUSH: 3.0000 mL | INTRAVENOUS | Status: DC | PRN

## 2017-12-29 MED ORDER — HEPARIN (PORCINE) 25000 UT/250ML-% IV SOLN
1150.0000 [IU]/h | INTRAVENOUS | Status: DC
  Administered 2017-12-29: 1000 [IU]/h via INTRAVENOUS
  Filled 2017-12-29: qty 250

## 2017-12-29 SURGICAL SUPPLY — 14 items
CATH 5FR JL3.5 JR4 ANG PIG MP (CATHETERS) ×2 IMPLANT
CATH INFINITI 5 FR JL3.5 (CATHETERS) ×2 IMPLANT
CATH SWAN GANZ 7F STRAIGHT (CATHETERS) ×2 IMPLANT
DEVICE RAD COMP TR BAND LRG (VASCULAR PRODUCTS) ×2 IMPLANT
GLIDESHEATH SLEND SS 6F .021 (SHEATH) ×2 IMPLANT
GUIDEWIRE INQWIRE 1.5J.035X260 (WIRE) ×1 IMPLANT
INQWIRE 1.5J .035X260CM (WIRE) ×2
KIT HEART LEFT (KITS) ×2 IMPLANT
PACK CARDIAC CATHETERIZATION (CUSTOM PROCEDURE TRAY) ×2 IMPLANT
SHEATH PINNACLE 7F 10CM (SHEATH) ×2 IMPLANT
SHEATH PROBE COVER 6X72 (BAG) ×2 IMPLANT
SYR MEDRAD MARK 7 150ML (SYRINGE) ×2 IMPLANT
TRANSDUCER W/STOPCOCK (MISCELLANEOUS) ×2 IMPLANT
TUBING CIL FLEX 10 FLL-RA (TUBING) ×2 IMPLANT

## 2017-12-29 NOTE — Care Management Important Message (Signed)
Important Message  Patient Details  Name: Gaia Gullikson MRN: [id number] Date of Birth: December 21, 1950   Medicare Important Message Given:  Yes    Kerin Salen 12/29/2017, 12:22 [place] Message  Patient Details  Name: Lasharn Bufkin MRN: [id number] Date of Birth: December 06, 1950   Medicare Important Message Given:  Yes    Kerin Salen 12/29/2017, 12:22 PM

## 2017-12-29 NOTE — Interval H&P Note (Signed)
History and Physical Interval Note:  12/29/2017 2:36 PM  Andrea [name]  has presented today for surgery, with the diagnosis of HF  The various methods of treatment have been discussed with the patient and family. After consideration of risks, benefits and other options for treatment, the patient has consented to  Procedure(s): RIGHT/LEFT HEART CATH AND CORONARY ANGIOGRAPHY (N/A) as a surgical intervention .  The patient's history has been reviewed, patient examined, no change in status, stable for surgery.  I have reviewed the patient's chart and labs.  Questions were answered to the patient's satisfaction.   Cath Lab Visit (complete for each Cath Lab visit)  Clinical Evaluation Leading to the Procedure:   ACS: No.  Non-ACS:    Anginal Classification: CCS II  Anti-ischemic medical therapy: Minimal Therapy (1 class of medications)  Non-Invasive Test Results: No non-invasive testing performed  Prior CABG: No previous CABG        Andrea [name] [facility] 12/29/2017 2:36 PM

## 2017-12-29 NOTE — Progress Notes (Signed)
[facility] PROGRESS NOTE    Progress Note  Andrea [name]  ERX:[id number] DOB: 1950-10-01 DOA: 12/26/2017 PCP: Medicine, [facility]     Brief Narrative:   Andrea [name] is an 67 y.o. female past medical history of diabetes mellitus type 2, hypertension history of breast cancer comes in for dyspnea on exertion progressively getting worse associated with chest tightness and a 14 pound weight gain with lower extremity edema for more than 10 days.  Assessment/Plan:   Acute systolic CHF (congestive heart failure) ([place]): Relates a runny nose and a sore throat about 2 weeks ago, since then she has been feeling short of breath not able to lay flat, she relates a 14 pound weight gain. In February 2019 her weight was 67 kg, on admission is now 75 kg 2D echo was done that showed an EF of 30%.  Diffuse hypokinesia no wall motion abnormality. Blood pressure seems to be stable, there has been a mild rise in her creatinine. Continue strict I's and O's Daily weights cardiology was consulted recommended a cardiac cath start Entresto and continue Lasix.  Essential Hypertension Continue Entresto and Coreg.  Diabetes mellitus without complication (HCC) Cont long-acting insulin plus sliding scale.  DVT prophylaxis: lovenox Family Communication:none Disposition Plan/Barrier to D/C: once work up for new HF completed and fully diuresed. Code Status:     Code Status Orders  (From admission, onward)         Start     Ordered   12/26/17 0936  Full code  Continuous     12/26/17 0936        Code Status History    Date Active Date Inactive Code Status Order ID Comments User Context   06/01/2017 1736 06/05/2017 2122 Full Code [id number]  Charlynne Cousins, MD Inpatient   03/17/2017 1521 03/20/2017 2043 Full Code [id number]  Phillips Grout, MD ED   02/17/2017 1927 02/24/2017 1747 Full Code [id number]  Elwin Mocha, MD ED   01/08/2017 0354 01/12/2017 1833 Full Code  [id number]  Vianne Bulls, MD ED   11/23/2016 0527 11/24/2016 2327 Full Code [id number]  Jani Gravel, MD Inpatient   11/28/2013 1828 12/06/2013 2058 Full Code [id number]  Coralie Keens, MD Inpatient        IV Access:    Peripheral IV   Procedures and diagnostic studies:   No results found.   Medical Consultants:    None.  Anti-Infectives:   None  Subjective:    Andrea [name] relates her breathing is better.  Objective:    Vitals:   12/28/17 1742 12/28/17 2110 12/29/17 0120 12/29/17 0640  BP: (!) 142/84 (!) 153/70 (!) 151/66 (!) 154/80  Pulse:  77 76 71  Resp:  16 16 16   Temp:  98.7 F (37.1 C) 98.5 F (36.9 C) 98 F (36.7 C)  TempSrc:  Oral Oral Oral  SpO2:  95% 96% 99%  Weight:    75.3 kg  Height:        Intake/Output Summary (Last 24 hours) at 12/29/2017 0749 Last data filed at 12/29/2017 0600 Gross per 24 hour  Intake 240 ml  Output -  Net 240 ml   Filed Weights   12/27/17 0637 12/28/17 0456 12/29/17 0640  Weight: 74.3 kg 75.1 kg 75.3 kg    Exam: General exam: In no acute distress. Respiratory system: Good air movement and clear to auscultation. Cardiovascular system: S1 & S2 heard, RRR. No JVD. Gastrointestinal system: Bowel sounds soft nondistended nontender. Central nervous  system: Awake alert and oriented x3 nonfocal. Extremities: Trace edema Skin: No rashes, lesions or ulcers Psychiatry: And insight appear normal.   Data Reviewed:    Labs: Basic Metabolic Panel: Recent Labs  Lab 12/26/17 0504 12/26/17 1201 12/27/17 0411 12/28/17 0511 12/29/17 0437  NA 139  --  142 140 141  K 4.7  --  4.0 4.5 4.2  CL 102  --  103 97* 101  CO2 28  --  31 34* 32  GLUCOSE 252*  --  133* 156* 141*  BUN 27*  --  28* 29* 27*  CREATININE 1.04* 0.97 1.16* 1.37* 1.15*  CALCIUM 8.9  --  8.5* 8.6* 8.3*   GFR Estimated Creatinine Clearance: 48.8 mL/min (A) (by C-G formula based on SCr of 1.15 mg/dL (H)). Liver Function Tests: No results  for input(s): AST, ALT, ALKPHOS, BILITOT, PROT, ALBUMIN in the last 168 hours. No results for input(s): LIPASE, AMYLASE in the last 168 hours. No results for input(s): AMMONIA in the last 168 hours. Coagulation profile No results for input(s): INR, PROTIME in the last 168 hours.  CBC: Recent Labs  Lab 12/26/17 0504 12/26/17 1201 12/27/17 0411  WBC 7.4 6.7 6.1  NEUTROABS 4.6  --   --   HGB 11.3* 10.5* 10.1*  HCT 34.7* 32.8* 31.6*  MCV 92.8 92.7 93.8  PLT 205 190 185   Cardiac Enzymes: No results for input(s): CKTOTAL, CKMB, CKMBINDEX, TROPONINI in the last 168 hours. BNP (last 3 results) No results for input(s): PROBNP in the last 8760 hours. CBG: Recent Labs  Lab 12/28/17 0732 12/28/17 1149 12/28/17 1741 12/28/17 2111 12/29/17 0734  GLUCAP 199* 173* 108* 269* 118*   D-Dimer: No results for input(s): DDIMER in the last 72 hours. Hgb A1c: No results for input(s): HGBA1C in the last 72 hours. Lipid Profile: No results for input(s): CHOL, HDL, LDLCALC, TRIG, CHOLHDL, LDLDIRECT in the last 72 hours. Thyroid function studies: No results for input(s): TSH, T4TOTAL, T3FREE, THYROIDAB in the last 72 hours.  Invalid input(s): FREET3 Anemia work up: No results for input(s): VITAMINB12, FOLATE, FERRITIN, TIBC, IRON, RETICCTPCT in the last 72 hours. Sepsis Labs: Recent Labs  Lab 12/26/17 0504 12/26/17 1201 12/27/17 0411  WBC 7.4 6.7 6.1   Microbiology No results found for this or any previous visit (from the past 240 hour(s)).   Medications:   . carvedilol  6.25 mg Oral BID WC  . enoxaparin (LOVENOX) injection  40 mg Subcutaneous Q24H  . furosemide  40 mg Intravenous Daily  . insulin aspart  0-15 Units Subcutaneous TID WC  . insulin aspart  0-5 Units Subcutaneous QHS  . insulin aspart  4 Units Subcutaneous TID WC  . insulin glargine  20 Units Subcutaneous Q2200  . levothyroxine  200 mcg Oral Q0600  . pregabalin  75 mg Oral BID  . sacubitril-valsartan  1 tablet  Oral BID  . sodium chloride flush  3 mL Intravenous Q12H   Continuous Infusions: . sodium chloride    . sodium chloride 10 mL/hr at 12/29/17 0643      LOS: 3 days   Charlynne Cousins  [facility]   *Please refer to [email or web address], password [email or web address] to get updated schedule on who will round on this patient, as hospitalists switch teams weekly. If 7PM-7AM, please contact night-coverage at [email or web address], password [email or web address] for any overnight needs.  12/29/2017, 7:49 AM

## 2017-12-29 NOTE — Progress Notes (Addendum)
Site area: Right groin a7 french venous sheath was removed by Cyndia Diver RN  Site Prior to Removal:  Level 0  Pressure Applied For 20 MINUTES    Bedrest Beginning at 1645p  Manual:   Yes.    Patient Status During Pull:  stable  Post Pull Groin Site:  Level 0  Post Pull Instructions Given:  Yes.    Post Pull Pulses Present:  Yes.    Dressing Applied:  Yes.    Comments:  VS reman stable

## 2017-12-29 NOTE — H&P (View-Only) (Signed)
Progress Note  Patient Name: Andrea [name] Date of Encounter: 12/29/2017  Primary Cardiologist: Kirk Ruths, MD   Subjective   No chest pain and some some SOB.  Discussed cardiac cath.   Inpatient Medications    Scheduled Meds: . carvedilol  6.25 mg Oral BID WC  . enoxaparin (LOVENOX) injection  40 mg Subcutaneous Q24H  . furosemide  40 mg Intravenous Daily  . insulin aspart  0-15 Units Subcutaneous TID WC  . insulin aspart  0-5 Units Subcutaneous QHS  . insulin aspart  4 Units Subcutaneous TID WC  . insulin glargine  20 Units Subcutaneous Q2200  . levothyroxine  200 mcg Oral Q0600  . pregabalin  75 mg Oral BID  . sacubitril-valsartan  1 tablet Oral BID  . sodium chloride flush  3 mL Intravenous Q12H   Continuous Infusions: . sodium chloride    . sodium chloride 10 mL/hr at 12/29/17 0643   PRN Meds: sodium chloride, acetaminophen **OR** acetaminophen, ALPRAZolam, morphine, ondansetron **OR** ondansetron (ZOFRAN) IV, promethazine, sodium chloride flush   Vital Signs    Vitals:   12/28/17 1742 12/28/17 2110 12/29/17 0120 12/29/17 0640  BP: (!) 142/84 (!) 153/70 (!) 151/66 (!) 154/80  Pulse:  77 76 71  Resp:  16 16 16   Temp:  98.7 F (37.1 C) 98.5 F (36.9 C) 98 F (36.7 C)  TempSrc:  Oral Oral Oral  SpO2:  95% 96% 99%  Weight:    75.3 kg  Height:        Intake/Output Summary (Last 24 hours) at 12/29/2017 0917 Last data filed at 12/29/2017 0600 Gross per 24 hour  Intake 120 ml  Output -  Net 120 ml   Filed Weights   12/27/17 0637 12/28/17 0456 12/29/17 0640  Weight: 74.3 kg 75.1 kg 75.3 kg    Telemetry    SR  - Personally Reviewed  ECG    SR with non specific T wave abnormality  - Personally Reviewed  Physical Exam   GEN: No acute distress. dyspneic with talking  Neck: No JVD Cardiac: RRR, no murmurs, rubs, or gallops.  Respiratory: Clear to auscultation bilaterally. GI: Soft, nontender, non-distended  MS: No edema; No  deformity. Neuro:  Nonfocal  Psych: Normal affect   Labs    Chemistry Recent Labs  Lab 12/27/17 0411 12/28/17 0511 12/29/17 0437  NA 142 140 141  K 4.0 4.5 4.2  CL 103 97* 101  CO2 31 34* 32  GLUCOSE 133* 156* 141*  BUN 28* 29* 27*  CREATININE 1.16* 1.37* 1.15*  CALCIUM 8.5* 8.6* 8.3*  GFRNONAA 49* 40* 50*  GFRAA 57* 46* 57*  ANIONGAP 8 9 8      Hematology Recent Labs  Lab 12/26/17 0504 12/26/17 1201 12/27/17 0411  WBC 7.4 6.7 6.1  RBC 3.74* 3.54* 3.37*  HGB 11.3* 10.5* 10.1*  HCT 34.7* 32.8* 31.6*  MCV 92.8 92.7 93.8  MCH 30.2 29.7 30.0  MCHC 32.6 32.0 32.0  RDW 12.9 12.9 12.9  PLT 205 190 185    Cardiac EnzymesNo results for input(s): TROPONINI in the last 168 hours.  Recent Labs  Lab 12/26/17 0518  TROPIPOC 0.02     BNP Recent Labs  Lab 12/26/17 0504  BNP 714.4*     DDimer  Recent Labs  Lab 12/26/17 0504  DDIMER 1.91*     Radiology    No results found.  Cardiac Studies   Echo 12/26/17   Study Conclusions  - Left ventricle: Apical images sub  optimal due to breast implants.   The cavity size was mildly dilated. Wall thickness was increased   in a pattern of mild LVH. Systolic function was moderately to   severely reduced. The estimated ejection fraction was in the   range of 30% to 35%. Diffuse hypokinesis. Doppler parameters are   consistent with both elevated ventricular end-diastolic filling   pressure and elevated left atrial filling pressure. - Left atrium: The atrium was moderately dilated.  Patient Profile     67 y.o. female with a h/o HTN, DM, prior breast CA who is admitted with CHF.  She reports progressive symptoms of SOB, chest tightness and edema over the  Past 10 days and admitted 12/26/17.  No prior CHF.   Assessment & Plan    Acute systolic HF with eF 16-10% --new no prior hx --troponin neg on poc --BNP 714  --Cr today 1.15   --negative 1610 since admit and wt down from 169 to 165.6 lbs today --on lasix 40 mg  IV daily --for Rt and Lt cardiac cath today. Will be after lunch will give cl. Liq now - on BB, entresto started as well yesterday --ALLERGY to ASA possible N&V - non given prior to cath.   HTN  --continues to be elevated 154/80  DM-2 --insulin dependent  Per IM  Elevated ddimer at 1.91  Hypothyroid per IM  --check tsh   Mild anemia with hgb 10.1          For questions or updates, please contact Page Please consult [email or web address] for contact info under        Signed, Cecilie Kicks, NP  12/29/2017, 9:17 AM   As above, patient seen and examined.  Volume status is improving.  Etiology of cardiomyopathy unclear.  Question ischemia mediated.  Question related to prior therapy for breast cancer. Question viral. Plan to continue medical therapy with Entresto, low-dose carvedilol and Lasix.  Will increase Entresto as tolerated by blood pressure and carvedilol as CHF improves.  We will also add Spironolactone.  For right and left cardiac catheterization today to exclude coronary disease.  The risks and benefits including myocardial infarction, CVA and death discussed and she agrees to proceed.  Note patient states she has an allergy to aspirin causing stomach irritation and severe nausea/vomiting.  If she has coronary disease will need to add Plavix.  Elevated TSH per primary care.  Kirk Ruths, MD

## 2017-12-29 NOTE — Progress Notes (Signed)
ANTICOAGULATION CONSULT NOTE - Initial Consult  Pharmacy Consult for Heparin  Indication: chest pain/ACS  Allergies  Allergen Reactions  . Aspirin Nausea And Vomiting  . Maxzide [Hydrochlorothiazide W-Triamterene] Swelling    Fluid retention  . Metformin And Related Diarrhea  . Nsaids Nausea And Vomiting  . Other Other (See Comments)    All steroids produce psychosis per pt Artificial sweeteners produce nausea and upset stomach.  Gregary Cromer [Pravastatin] Other (See Comments)    unknown  . Stadol [Butorphanol] Other (See Comments)    Toradol, etc And related- hallucinations  . Toradol [Ketorolac Tromethamine] Other (See Comments)    hallucinations  . Vistaril [Hydroxyzine Hcl] Other (See Comments)    unknown  . Erythromycin Itching and Rash  . Morphine And Related Hives and Rash    Iv morphine.  . Penicillins Hives, Itching and Rash    Has patient had a PCN reaction causing immediate rash, facial/tongue/throat swelling, SOB or lightheadedness with hypotension: yes Has patient had a PCN reaction causing severe rash involving mucus membranes or skin necrosis: unknown Has patient had a PCN reaction that required hospitalization : unknown Has patient had a PCN reaction occurring within the last 10 years: no If all of the above answers are "NO", then may proceed with Cephalosporin use.     Patient Measurements: Height: 5\' 5"  (165.1 cm) Weight: 165 lb 14.4 oz (75.3 kg) IBW/kg (Calculated) : 57   Vital Signs: Temp Source: Oral (12/09 1722) BP: 120/88 (12/09 1807) Pulse Rate: 78 (12/09 1807)  Labs: Recent Labs    12/27/17 0411 12/28/17 0511 12/29/17 0437  HGB 10.1*  --   --   HCT 31.6*  --   --   PLT 185  --   --   CREATININE 1.16* 1.37* 1.15*    Estimated Creatinine Clearance: 48.8 mL/min (A) (by C-G formula based on SCr of 1.15 mg/dL (H)).   Medical History: Past Medical History:  Diagnosis Date  . Breast cancer ([place])    bilat mastectomy and LUE  lymphadenectomy 2014, chemoRx 2014 - 15  . Diabetes mellitus without complication ([place])   . Esophageal reflux   . Hypertension   . Hypothyroid   . Migraine   . 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  Start heparin 8hr after radial sheath out - out 1530 - restart at 2330 Drip rate 1000 uts/hr  Goal of Therapy:  Heparin level 0.3-0.7 units/ml Monitor platelets by anticoagulation protocol: Yes   Plan:  Restart heparin drip rate 1000 uts/hr tonight at 2330 - 8hr after sheath out DAily HL, CBC  Bonnita Nasuti Pharm.D. CPP, BCPS Clinical Pharmacist [phone] 12/29/2017 6:54 PM

## 2017-12-29 NOTE — Progress Notes (Signed)
Pt in cath holding area via [facility]. Aler, oriented, MAE. Reports CP at 7/10. 2LNC with 100% o2 sat awaiting procedure.

## 2017-12-29 NOTE — Care Management Important Message (Signed)
Important Message  Patient Details  Name: Yolander Goodie MRN: [id number] Date of Birth: 1950/03/02   Medicare Important Message Given:  Yes    Kerin Salen 12/29/2017, 12:38 [place] Message  Patient Details  Name: Jaionna Weisse MRN: [id number] Date of Birth: [birth year]   Medicare Important Message Given:  Yes    Kerin Salen 12/29/2017, 12:38 PM

## 2017-12-29 NOTE — Consult Note (Signed)
[address]       [place] [address]             [phone]        Janyce Hankinson Storey Medical Record #[id number] Date of Birth: [birth year]  Referring: No ref. provider found Primary Care: Medicine, [facility] Primary Cardiologist:Brian Stanford Breed, MD  Chief Complaint:    Chief Complaint  Patient presents with  . Shortness of Breath    History of Present Illness:     Patient examined, images of coronary angiogram and echocardiogram and chest x-ray as well as right heart cath hemodynamic data all personally reviewed and situation discussed with patient.  Patient is a 67 year old female chronic medical problems who was admitted with class IV  acute systolic/diastolic heart failure.  She states she gained 30 pounds in weight over the past 3 to 4 months.  She presented to [facility] long ER with hypoxia and pulmonary edema, PCO2 was 60.  She had no chest pain cardiac enzymes are negative.  BNP was elevated at 750.  She was in sinus rhythm without fever.  An echocardiogram showed EF of 30% significant valvular disease.  Had bilateral mastectomy with treatment for left breast cancer with chemotherapy in 2016 at [facility]. The patient is a diabetic, non-smoker, with history of abdominal pain, C. difficile colitis up until May of this year, hypertension and frequent falls.  Cardiac catheterization was performed today which showed moderate RCA disease, high-grade stenosis of the proximal LAD, and a small nondominant circumflex.  LVEDP was 15.  Her wedge was 17 PA pressures 30/15.  Patient has been recently started on Entresto and starting to feel better with aggressive diuresis.  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  I doubt that CABG would help the patient at this time and if the CAD is to be  treated a PCI of the proximal LAD would probably the best option.  Current Activity/ Functional Status: Patient lives by herself, retired, sedentary lifestyle.   Zubrod Score: At the time of surgery this patient's most appropriate activity status/level should be described as: []     0    Normal activity, no symptoms []     1    Restricted in physical strenuous activity but ambulatory, able to do out light work []     2    Ambulatory and capable of self care, unable to do work activities, up and about                 more than 50%  Of the time                            [x]     3    Only limited self care, in bed greater than 50% of waking hours []     4    Completely disabled, no self care, confined to bed or chair []     5    Moribund  Past Medical History:  Diagnosis Date  . Breast cancer ([place])    bilat mastectomy and LUE lymphadenectomy 2014, chemoRx 2014 - 15  . Diabetes mellitus without complication ([place])   . Esophageal reflux   . Hypertension   . Hypothyroid   . Migraine   . Seizure [facility])     Past Surgical History:  Procedure Laterality Date  .  ABDOMINAL HYSTERECTOMY     1984 , done for ruptured cyst and endometriosis  . APPENDECTOMY    . CHOLECYSTECTOMY     1980's  . EYE SURGERY  08/28/2015   Right eye  . KNEE SURGERY Left   . MASTECTOMY     bilateral  . Open surgery for bowel obstruction, 2000's      Social History   Tobacco Use  Smoking Status Never Smoker  Smokeless Tobacco Never Used    Social History   Substance and Sexual Activity  Alcohol Use No     Allergies  Allergen Reactions  . Aspirin Nausea And Vomiting  . Maxzide [Hydrochlorothiazide W-Triamterene] Swelling    Fluid retention  . Metformin And Related Diarrhea  . Nsaids Nausea And Vomiting  . Other Other (See Comments)    All steroids produce psychosis per pt Artificial sweeteners produce nausea and upset stomach.  Gregary Cromer [Pravastatin] Other (See Comments)    unknown  . Stadol  [Butorphanol] Other (See Comments)    Toradol, etc And related- hallucinations  . Toradol [Ketorolac Tromethamine] Other (See Comments)    hallucinations  . Vistaril [Hydroxyzine Hcl] Other (See Comments)    unknown  . Erythromycin Itching and Rash  . Morphine And Related Hives and Rash    Iv morphine.  . Penicillins Hives, Itching and Rash    Has patient had a PCN reaction causing immediate rash, facial/tongue/throat swelling, SOB or lightheadedness with hypotension: yes Has patient had a PCN reaction causing severe rash involving mucus membranes or skin necrosis: unknown Has patient had a PCN reaction that required hospitalization : unknown Has patient had a PCN reaction occurring within the last 10 years: no If all of the above answers are "NO", then may proceed with Cephalosporin use.     Current Facility-Administered Medications  Medication Dose Route Frequency Provider Last Rate Last Dose  . [START ON 12/30/2017] 0.9 %  sodium chloride infusion  250 mL Intravenous PRN [place], Korryn Pancoast M, MD      . 0.9% sodium chloride infusion  1 mL/kg/hr Intravenous Continuous [place], Darice Vicario M, MD 75.3 mL/hr at 12/29/17 1605 1 mL/kg/hr at 12/29/17 1605  . acetaminophen (TYLENOL) tablet 650 mg  650 mg Oral Q6H PRN [place], Aariya Ferrick M, MD   650 mg at 12/28/17 2316   Or  . acetaminophen (TYLENOL) suppository 650 mg  650 mg Rectal Q6H PRN [place], Azeneth Carbonell M, MD      . ALPRAZolam Duanne Moron) tablet 0.5 mg  0.5 mg Oral QID PRN [place], Carrol Bondar M, MD   0.5 mg at 12/29/17 1642  . carvedilol (COREG) tablet 6.25 mg  6.25 mg Oral BID WC [place], Christyne Mccain M, MD   6.25 mg at 12/29/17 0953  . furosemide (LASIX) injection 40 mg  40 mg Intravenous Daily [place], Keron Neenan M, MD   40 mg at 12/29/17 1637  . insulin aspart (novoLOG) injection 0-15 Units  0-15 Units Subcutaneous TID WC [place], Taniela Feltus M, MD   3 Units at 12/28/17 1203  . insulin aspart (novoLOG) injection 0-5 Units  0-5 Units Subcutaneous QHS [place], Joelee Snoke M, MD   3 Units at  12/28/17 2126  . insulin aspart (novoLOG) injection 4 Units  4 Units Subcutaneous TID WC [place], Verdene Creson M, MD   4 Units at 12/28/17 1742  . insulin glargine (LANTUS) injection 20 Units  20 Units Subcutaneous Q2200 [place], Aayliah Rotenberry M, MD   20 Units at 12/28/17 2126  . levothyroxine (SYNTHROID, LEVOTHROID) tablet 200 mcg  200 mcg  Oral Q0600 [place], Janis Sol M, MD   200 mcg at 12/29/17 0865  . morphine (MSIR) tablet 15 mg  15 mg Oral Q3H PRN [place], Oasis Goehring M, MD   15 mg at 12/29/17 0954  . ondansetron (ZOFRAN) tablet 4 mg  4 mg Oral Q6H PRN [place], Brynn Reznik M, MD   4 mg at 12/27/17 0106   Or  . ondansetron [facility]) injection 4 mg  4 mg Intravenous Q6H PRN [place], Eleina Jergens M, MD   4 mg at 12/27/17 1954  . pregabalin (LYRICA) capsule 75 mg  75 mg Oral BID [place], Troye Hiemstra M, MD   75 mg at 12/29/17 0953  . promethazine (PHENERGAN) tablet 25 mg  25 mg Oral Q6H PRN [place], Almin Livingstone M, MD   25 mg at 12/28/17 2316  . sacubitril-valsartan (ENTRESTO) 24-26 mg per tablet  1 tablet Oral BID [place], Zionah Criswell M, MD   1 tablet at 12/29/17 [phone]  . [START ON 12/30/2017] sodium chloride flush (NS) 0.9 % injection 3 mL  3 mL Intravenous Q12H [place], Bria Portales M, MD      . Derrill Memo ON 12/30/2017] sodium chloride flush (NS) 0.9 % injection 3 mL  3 mL Intravenous PRN [place], Candela Krul M, MD        Medications Prior to Admission  Medication Sig Dispense Refill Last Dose  . ALPRAZolam (XANAX) 0.5 MG tablet Take 0.5 mg by mouth 4 (four) times daily as needed for anxiety.    12/25/2017 at Unknown time  . amLODipine (NORVASC) 10 MG tablet Take 1 tablet (10 mg total) by mouth daily. 30 tablet 0 12/25/2017 at Unknown time  . atenolol (TENORMIN) 100 MG tablet Take 50 mg by mouth 2 (two) times daily.   12/25/2017 at 1900  . LANTUS SOLOSTAR 100 UNIT/ML Solostar Pen INJECT 20 - 50 UNITS UNDER THE SKIN DAILY AS DIRECTED  1 12/25/2017 at Unknown time  . levothyroxine (SYNTHROID, LEVOTHROID) 200 MCG tablet Take 1 tablet (200 mcg total) by mouth at bedtime. (Patient  taking differently: Take 200 mcg by mouth daily before breakfast. ) 30 tablet 0 12/25/2017 at Unknown time  . lisinopril (PRINIVIL,ZESTRIL) 10 MG tablet Take 10 mg by mouth at bedtime.    12/25/2017 at Unknown time  . MORPHABOND ER 30 MG T12A Take 1 tablet by mouth every 12 (twelve) hours.  0 12/25/2017 at Unknown time  . oxyCODONE-acetaminophen (PERCOCET) 10-325 MG tablet Take 1 tablet by mouth 3 (three) times daily as needed for pain.  0 Past Week at Unknown time  . pregabalin (LYRICA) 75 MG capsule Take 1 capsule (75 mg total) 2 (two) times daily by mouth. 60 capsule 0 12/25/2017 at Unknown time  . promethazine (PHENERGAN) 25 MG tablet Take 25 mg by mouth every 6 (six) hours as needed for nausea.   1 12/25/2017 at Unknown time  . ciprofloxacin (CIPRO) 500 MG tablet Take 1 tablet (500 mg total) by mouth 2 (two) times daily. (Patient not taking: Reported on 12/26/2017) 14 tablet 0 Not Taking at Unknown time  . metroNIDAZOLE (FLAGYL) 500 MG tablet Take 1 tablet (500 mg total) by mouth 3 (three) times daily. (Patient not taking: Reported on 12/26/2017) 21 tablet 0 Not Taking at Unknown time  . saccharomyces boulardii (FLORASTOR) 250 MG capsule Take 1 capsule (250 mg total) by mouth 2 (two) times daily. (Patient not taking: Reported on 12/26/2017) 28 capsule 0 Not Taking at Unknown time    Family History  Problem Relation Age of Onset  . Sudden Cardiac Death  Neg Hx      Review of Systems:   ROS Patient is right-hand dominant She has had frequent falls including multiple left-sided rib fractures She has had bilateral mastectomy and hysterectomy and has bilateral asymmetric breast implants    Cardiac Review of Systems: Y or  [    ]= no  Chest Pain [    ]  Resting SOB [ y  ] Exertional SOB  [ y ]  Vertell Limber Blue.Reese  ]   Pedal Edema [ y  ]    Palpitations [  ] Syncope  [  ]   Presyncope [   ]  General Review of Systems: [Y] = yes [  ]=no Constitional: recent weight change [  ]; anorexia [  ]; fatigue [  ];  nausea [  ]; night sweats [  ]; fever [  ]; or chills [  ]                                                               Dental: Last Dentist visit: 1 year  Eye : blurred vision [  ]; diplopia [   ]; vision changes [  ];  Amaurosis fugax[  ]; Resp: cough Blue.Reese  ];  wheezing[  ];  hemoptysis[  ]; shortness of breath[y  ]; paroxysmal nocturnal dyspnea[  ]; dyspnea on exertion[  ]; or orthopnea[  ];  GI:  gallstones[  ], vomiting[  ]; history of C. difficile colitis up until May of this year dysphagia[  ]; melena[  ];  hematochezia [  ]; heartburn[  ];   Hx of  Colonoscopy[  ]; GU: kidney stones [  ]; hematuria[  ];   dysuria [  ];  nocturia[  ];  history of     obstruction [  ]; urinary frequency [  ]             Skin: rash, swelling[  ];, hair loss[  ];  peripheral edema[  ];  or itching[  ]; Musculosketetal: myalgias[  ];  joint swelling[  ];  joint erythema[  ];  joint pain[  ];  back pain[  ];  Heme/Lymph: bruising[  ];  bleeding[  ];  anemia[  ];  Neuro: TIA[  ];  headaches[  ];  stroke[  ];  vertigo[  ];  seizures[  ];   paresthesias[  ];  difficulty walking[  ];  Psych:depression[  ]; anxiety[  ];  Endocrine: diabetes[ y ];  thyroid dysfunction[  ];                 Physical Exam: BP (!) 142/44 (BP Location: Left Leg)   Pulse 79   Temp 98 F (36.7 C) (Oral)   Resp 19   Ht 5\' 5"  (1.651 m)   Wt 75.3 kg   SpO2 96%   BMI 27.61 kg/m        Exam    General- alert and comfortable but frail appearing female appears older than her s stated age    Neck- no JVD, no cervical adenopathy palpable, no carotid bruit   Lungs-diminished bilaterally   Cor- regular rate and rhythm, no murmur , gallop   Abdomen- soft, non-tender   Extremities - warm, non-tender, moderate edema is  present  neuro- oriented, appropriate, no focal weakness    Diagnostic Studies & Laboratory data:     Recent Radiology Findings:   No results found.   I have independently reviewed the above radiologic studies and  discussed with the patient   Recent Lab Findings: Lab Results  Component Value Date   WBC 6.1 12/27/2017   HGB 10.1 (L) 12/27/2017   HCT 31.6 (L) 12/27/2017   PLT 185 12/27/2017   GLUCOSE 141 (H) 12/29/2017   ALT 27 06/02/2017   AST 15 06/02/2017   NA 141 12/29/2017   K 4.2 12/29/2017   CL 101 12/29/2017   CREATININE 1.15 (H) 12/29/2017   BUN 27 (H) 12/29/2017   CO2 32 12/29/2017   TSH 17.450 (H) 12/29/2017   HGBA1C 7.3 (H) 06/02/2017      Assessment / Plan:      Progressive mixed systolic/diastolic heart failure most likely from a nonischemic cardiomyopathy with associated coronary disease.  Would not recommend CABG at this time but would recommend input from advanced heart failure service and consideration of proximal LAD PCI.

## 2017-12-29 NOTE — Progress Notes (Addendum)
Progress Note  Patient Name: Andrea [name] Date of Encounter: 12/29/2017  Primary Cardiologist: Kirk Ruths, MD   Subjective   No chest pain and some some SOB.  Discussed cardiac cath.   Inpatient Medications    Scheduled Meds: . carvedilol  6.25 mg Oral BID WC  . enoxaparin (LOVENOX) injection  40 mg Subcutaneous Q24H  . furosemide  40 mg Intravenous Daily  . insulin aspart  0-15 Units Subcutaneous TID WC  . insulin aspart  0-5 Units Subcutaneous QHS  . insulin aspart  4 Units Subcutaneous TID WC  . insulin glargine  20 Units Subcutaneous Q2200  . levothyroxine  200 mcg Oral Q0600  . pregabalin  75 mg Oral BID  . sacubitril-valsartan  1 tablet Oral BID  . sodium chloride flush  3 mL Intravenous Q12H   Continuous Infusions: . sodium chloride    . sodium chloride 10 mL/hr at 12/29/17 0643   PRN Meds: sodium chloride, acetaminophen **OR** acetaminophen, ALPRAZolam, morphine, ondansetron **OR** ondansetron (ZOFRAN) IV, promethazine, sodium chloride flush   Vital Signs    Vitals:   12/28/17 1742 12/28/17 2110 12/29/17 0120 12/29/17 0640  BP: (!) 142/84 (!) 153/70 (!) 151/66 (!) 154/80  Pulse:  77 76 71  Resp:  16 16 16   Temp:  98.7 F (37.1 C) 98.5 F (36.9 C) 98 F (36.7 C)  TempSrc:  Oral Oral Oral  SpO2:  95% 96% 99%  Weight:    75.3 kg  Height:        Intake/Output Summary (Last 24 hours) at 12/29/2017 0917 Last data filed at 12/29/2017 0600 Gross per 24 hour  Intake 120 ml  Output -  Net 120 ml   Filed Weights   12/27/17 0637 12/28/17 0456 12/29/17 0640  Weight: 74.3 kg 75.1 kg 75.3 kg    Telemetry    SR  - Personally Reviewed  ECG    SR with non specific T wave abnormality  - Personally Reviewed  Physical Exam   GEN: No acute distress. dyspneic with talking  Neck: No JVD Cardiac: RRR, no murmurs, rubs, or gallops.  Respiratory: Clear to auscultation bilaterally. GI: Soft, nontender, non-distended  MS: No edema; No  deformity. Neuro:  Nonfocal  Psych: Normal affect   Labs    Chemistry Recent Labs  Lab 12/27/17 0411 12/28/17 0511 12/29/17 0437  NA 142 140 141  K 4.0 4.5 4.2  CL 103 97* 101  CO2 31 34* 32  GLUCOSE 133* 156* 141*  BUN 28* 29* 27*  CREATININE 1.16* 1.37* 1.15*  CALCIUM 8.5* 8.6* 8.3*  GFRNONAA 49* 40* 50*  GFRAA 57* 46* 57*  ANIONGAP 8 9 8      Hematology Recent Labs  Lab 12/26/17 0504 12/26/17 1201 12/27/17 0411  WBC 7.4 6.7 6.1  RBC 3.74* 3.54* 3.37*  HGB 11.3* 10.5* 10.1*  HCT 34.7* 32.8* 31.6*  MCV 92.8 92.7 93.8  MCH 30.2 29.7 30.0  MCHC 32.6 32.0 32.0  RDW 12.9 12.9 12.9  PLT 205 190 185    Cardiac EnzymesNo results for input(s): TROPONINI in the last 168 hours.  Recent Labs  Lab 12/26/17 0518  TROPIPOC 0.02     BNP Recent Labs  Lab 12/26/17 0504  BNP 714.4*     DDimer  Recent Labs  Lab 12/26/17 0504  DDIMER 1.91*     Radiology    No results found.  Cardiac Studies   Echo 12/26/17   Study Conclusions  - Left ventricle: Apical images sub  optimal due to breast implants.   The cavity size was mildly dilated. Wall thickness was increased   in a pattern of mild LVH. Systolic function was moderately to   severely reduced. The estimated ejection fraction was in the   range of 30% to 35%. Diffuse hypokinesis. Doppler parameters are   consistent with both elevated ventricular end-diastolic filling   pressure and elevated left atrial filling pressure. - Left atrium: The atrium was moderately dilated.  Patient Profile     67 y.o. female with a h/o HTN, DM, prior breast CA who is admitted with CHF.  She reports progressive symptoms of SOB, chest tightness and edema over the  Past 10 days and admitted 12/26/17.  No prior CHF.   Assessment & Plan    Acute systolic HF with eF 83-41% --new no prior hx --troponin neg on poc --BNP 714  --Cr today 1.15   --negative 1610 since admit and wt down from 169 to 165.6 lbs today --on lasix 40 mg  IV daily --for Rt and Lt cardiac cath today. Will be after lunch will give cl. Liq now - on BB, entresto started as well yesterday --ALLERGY to ASA possible N&V - non given prior to cath.   HTN  --continues to be elevated 154/80  DM-2 --insulin dependent  Per IM  Elevated ddimer at 1.91  Hypothyroid per IM  --check tsh   Mild anemia with hgb 10.1          For questions or updates, please contact [place] Please consult [email or web address] for contact info under        Signed, Cecilie Kicks, NP  12/29/2017, 9:17 AM   As above, patient seen and examined.  Volume status is improving.  Etiology of cardiomyopathy unclear.  Question ischemia mediated.  Question related to prior therapy for breast cancer. Question viral. Plan to continue medical therapy with Entresto, low-dose carvedilol and Lasix.  Will increase Entresto as tolerated by blood pressure and carvedilol as CHF improves.  We will also add Spironolactone.  For right and left cardiac catheterization today to exclude coronary disease.  The risks and benefits including myocardial infarction, CVA and death discussed and she agrees to proceed.  Note patient states she has an allergy to aspirin causing stomach irritation and severe nausea/vomiting.  If she has coronary disease will need to add Plavix.  Elevated TSH per primary care.  Kirk Ruths, MD

## 2017-12-30 ENCOUNTER — Encounter (HOSPITAL_COMMUNITY): Payer: Self-pay | Admitting: Cardiology

## 2017-12-30 ENCOUNTER — Encounter (HOSPITAL_COMMUNITY)
Admission: EM | Disposition: A | Discharge status: Home / Self Care | Payer: Self-pay | Source: Home / Self Care | Attending: Internal Medicine

## 2017-12-30 DIAGNOSIS — Z9861 Coronary angioplasty status: Secondary | ICD-10-CM

## 2017-12-30 DIAGNOSIS — I2510 Atherosclerotic heart disease of native coronary artery without angina pectoris: Secondary | ICD-10-CM | POA: Diagnosis present

## 2017-12-30 DIAGNOSIS — E038 Other specified hypothyroidism: Secondary | ICD-10-CM

## 2017-12-30 HISTORY — PX: CORONARY STENT INTERVENTION: CATH118234

## 2017-12-30 LAB — POCT ACTIVATED CLOTTING TIME: Activated Clotting Time: 439 seconds

## 2017-12-30 LAB — GLUCOSE, CAPILLARY
Glucose-Capillary: 222 mg/dL — ABNORMAL HIGH (ref 70–99)
Glucose-Capillary: 141 mg/dL — ABNORMAL HIGH (ref 70–99)
Glucose-Capillary: 152 mg/dL — ABNORMAL HIGH (ref 70–99)
Glucose-Capillary: 156 mg/dL — ABNORMAL HIGH (ref 70–99)

## 2017-12-30 LAB — BASIC METABOLIC PANEL
Anion gap: 10 (ref 5–15)
BUN: 17 mg/dL (ref 8–23)
CO2: 28 mmol/L (ref 22–32)
Calcium: 8.3 mg/dL — ABNORMAL LOW (ref 8.9–10.3)
Chloride: 102 mmol/L (ref 98–111)
Creatinine, Ser: 1 mg/dL (ref 0.44–1.00)
GFR calc Af Amer: >60 mL/min (ref >60)
GFR calc non Af Amer: 59 mL/min — ABNORMAL LOW (ref >60)
GLUCOSE: 205 mg/dL — AB (ref 70–99)
POTASSIUM: 4 mmol/L (ref 3.5–5.1)
Sodium: 140 mmol/L (ref 135–145)

## 2017-12-30 LAB — CBC
HCT: 34.9 % — ABNORMAL LOW (ref 36.0–46.0)
HEMOGLOBIN: 11.5 g/dL — AB (ref 12.0–15.0)
MCH: 29.6 pg (ref 26.0–34.0)
MCHC: 33 g/dL (ref 30.0–36.0)
MCV: 89.7 fL (ref 80.0–100.0)
Platelets: 192 10*3/uL (ref 150–400)
RBC: 3.89 MIL/uL (ref 3.87–5.11)
RDW: 12.4 % (ref 11.5–15.5)
WBC: 7 10*3/uL (ref 4.0–10.5)
nRBC: 0 % (ref 0.0–0.2)

## 2017-12-30 LAB — HEPARIN LEVEL (UNFRACTIONATED): Heparin Unfractionated: 0.13 IU/mL — ABNORMAL LOW (ref 0.30–0.70)

## 2017-12-30 SURGERY — CORONARY STENT INTERVENTION
Anesthesia: LOCAL

## 2017-12-30 MED ORDER — ENOXAPARIN SODIUM 40 MG/0.4ML ~~LOC~~ SOLN
40.0000 mg | SUBCUTANEOUS | Status: DC
  Administered 2017-12-31: 08:00:00 40 mg via SUBCUTANEOUS
  Filled 2017-12-30: qty 0.4

## 2017-12-30 MED ORDER — SODIUM CHLORIDE 0.9 % IV SOLN: 250.0000 mL | INTRAVENOUS | Status: DC | PRN

## 2017-12-30 MED ORDER — HEPARIN SODIUM (PORCINE) 1000 UNIT/ML IJ SOLN
INTRAMUSCULAR | Status: AC
  Filled 2017-12-30: qty 1

## 2017-12-30 MED ORDER — NITROGLYCERIN 1 MG/10 ML FOR IR/CATH LAB
INTRA_ARTERIAL | Status: AC
  Filled 2017-12-30: qty 10

## 2017-12-30 MED ORDER — LIDOCAINE HCL (PF) 1 % IJ SOLN
INTRAMUSCULAR | Status: AC
  Filled 2017-12-30: qty 30

## 2017-12-30 MED ORDER — SODIUM CHLORIDE 0.9% FLUSH: 3.0000 mL | INTRAVENOUS | Status: DC | PRN

## 2017-12-30 MED ORDER — SODIUM CHLORIDE 0.9% FLUSH
3.0000 mL | Freq: Two times a day (BID) | INTRAVENOUS | Status: DC
  Administered 2017-12-30 (×2): 3 mL via INTRAVENOUS

## 2017-12-30 MED ORDER — TICAGRELOR 90 MG PO TABS
90.0000 mg | ORAL_TABLET | Freq: Two times a day (BID) | ORAL | Status: DC
  Administered 2017-12-30 – 2017-12-31 (×2): 90 mg via ORAL
  Filled 2017-12-30 (×2): qty 1

## 2017-12-30 MED ORDER — ANGIOPLASTY BOOK
Freq: Once | Status: AC
  Administered 2017-12-30: 22:00:00
  Filled 2017-12-30: qty 1

## 2017-12-30 MED ORDER — HEPARIN (PORCINE) IN NACL 1000-0.9 UT/500ML-% IV SOLN
INTRAVENOUS | Status: DC | PRN
  Administered 2017-12-30 (×2): 500 mL

## 2017-12-30 MED ORDER — SODIUM CHLORIDE 0.9 % IV SOLN
INTRAVENOUS | Status: DC
  Administered 2017-12-30: 09:00:00 via INTRAVENOUS

## 2017-12-30 MED ORDER — SODIUM CHLORIDE 0.9% FLUSH: 3.0000 mL | Freq: Two times a day (BID) | INTRAVENOUS | Status: DC

## 2017-12-30 MED ORDER — CLOPIDOGREL BISULFATE 300 MG PO TABS
600.0000 mg | ORAL_TABLET | Freq: Once | ORAL | Status: AC
  Administered 2017-12-30: 600 mg via ORAL
  Filled 2017-12-30: qty 2
  Filled 2017-12-30: qty 8

## 2017-12-30 MED ORDER — HEPARIN SODIUM (PORCINE) 1000 UNIT/ML IJ SOLN
INTRAMUSCULAR | Status: DC | PRN
  Administered 2017-12-30: 8000 [IU] via INTRAVENOUS

## 2017-12-30 MED ORDER — TICAGRELOR 90 MG PO TABS
ORAL_TABLET | ORAL | Status: AC
  Filled 2017-12-30: qty 2

## 2017-12-30 MED ORDER — FENTANYL CITRATE (PF) 100 MCG/2ML IJ SOLN
INTRAMUSCULAR | Status: AC
  Filled 2017-12-30: qty 2

## 2017-12-30 MED ORDER — MIDAZOLAM HCL 2 MG/2ML IJ SOLN
INTRAMUSCULAR | Status: AC
  Filled 2017-12-30: qty 2

## 2017-12-30 MED ORDER — VERAPAMIL HCL 2.5 MG/ML IV SOLN
INTRAVENOUS | Status: DC | PRN
  Administered 2017-12-30: 10 mL via INTRA_ARTERIAL

## 2017-12-30 MED ORDER — IOHEXOL 350 MG/ML SOLN
INTRAVENOUS | Status: DC | PRN
  Administered 2017-12-30: 115 mL via INTRACARDIAC

## 2017-12-30 MED ORDER — CLOPIDOGREL BISULFATE 75 MG PO TABS: 75.0000 mg | ORAL_TABLET | Freq: Every day | ORAL | Status: DC

## 2017-12-30 MED ORDER — ATORVASTATIN CALCIUM 80 MG PO TABS
80.0000 mg | ORAL_TABLET | Freq: Every day | ORAL | Status: DC
  Administered 2017-12-30: 80 mg via ORAL
  Filled 2017-12-30: qty 1

## 2017-12-30 MED ORDER — LIDOCAINE HCL (PF) 1 % IJ SOLN
INTRAMUSCULAR | Status: DC | PRN
  Administered 2017-12-30: 2 mL

## 2017-12-30 MED ORDER — ASPIRIN 81 MG PO CHEW
81.0000 mg | CHEWABLE_TABLET | ORAL | Status: DC
  Filled 2017-12-30: qty 1

## 2017-12-30 MED ORDER — LIVING BETTER WITH HEART FAILURE BOOK
Freq: Once | Status: AC
  Administered 2017-12-30: 22:00:00

## 2017-12-30 MED ORDER — FENTANYL CITRATE (PF) 100 MCG/2ML IJ SOLN
INTRAMUSCULAR | Status: DC | PRN
  Administered 2017-12-30: 25 ug via INTRAVENOUS

## 2017-12-30 MED ORDER — MIDAZOLAM HCL 2 MG/2ML IJ SOLN
INTRAMUSCULAR | Status: DC | PRN
  Administered 2017-12-30: 2 mg via INTRAVENOUS

## 2017-12-30 MED ORDER — VERAPAMIL HCL 2.5 MG/ML IV SOLN
INTRAVENOUS | Status: AC
  Filled 2017-12-30: qty 2

## 2017-12-30 MED ORDER — SODIUM CHLORIDE 0.9 % IV SOLN: INTRAVENOUS | Status: AC

## 2017-12-30 MED ORDER — HEPARIN (PORCINE) IN NACL 1000-0.9 UT/500ML-% IV SOLN
INTRAVENOUS | Status: AC
  Filled 2017-12-30: qty 1000

## 2017-12-30 SURGICAL SUPPLY — 18 items
BALLN EMERGE MR 2.0X12 (BALLOONS) ×2
BALLN SAPPHIRE ~~LOC~~ 2.75X12 (BALLOONS) ×2 IMPLANT
BALLN SAPPHIRE ~~LOC~~ 3.5X15 (BALLOONS) ×2 IMPLANT
BALLOON EMERGE MR 2.0X12 (BALLOONS) ×1 IMPLANT
CATH VISTA GUIDE 6FR XBLAD3.5 (CATHETERS) ×2 IMPLANT
DEVICE RAD COMP TR BAND LRG (VASCULAR PRODUCTS) ×2 IMPLANT
ELECT DEFIB PAD ADLT CADENCE (PAD) ×2 IMPLANT
GLIDESHEATH SLEND SS 6F .021 (SHEATH) ×2 IMPLANT
GUIDEWIRE INQWIRE 1.5J.035X260 (WIRE) ×1 IMPLANT
INQWIRE 1.5J .035X260CM (WIRE) ×2
KIT ENCORE 26 ADVANTAGE (KITS) ×2 IMPLANT
KIT HEART LEFT (KITS) ×2 IMPLANT
PACK CARDIAC CATHETERIZATION (CUSTOM PROCEDURE TRAY) ×2 IMPLANT
STENT SYNERGY DES 2.5X16 (Permanent Stent) ×2 IMPLANT
STENT SYNERGY DES 3X38 (Permanent Stent) ×2 IMPLANT
TRANSDUCER W/STOPCOCK (MISCELLANEOUS) ×2 IMPLANT
TUBING CIL FLEX 10 FLL-RA (TUBING) ×2 IMPLANT
WIRE ASAHI PROWATER 180CM (WIRE) ×2 IMPLANT

## 2017-12-30 NOTE — Progress Notes (Addendum)
[facility]                                                                              Patient Demographics  Andrea [name], is a 67 y.o. female, DOB - 1950-01-26, YBO:[id number]  Admit date - 12/26/2017   Admitting Physician Mauricio Gerome Apley, MD  Outpatient Primary MD for the patient is Medicine, [facility]  Outpatient specialists:   LOS - 4  days   Medical records reviewed and are as summarized below:    Chief Complaint  Patient presents with  . Shortness of Breath       Brief summary   Andrea [name] is an 67 y.o. female past medical history of diabetes mellitus type 2, hypertension, history of breast cancer comes in for dyspnea on exertion progressively getting worse associated with chest tightness and a 14 pound weight gain with lower extremity edema for more than 10 days.   Assessment & Plan    Principal Problem:   Acute systolic CHF (congestive heart failure) (HCC) -Presented with orthopnea, DOE, 14 pound weight gain, significant volume overload. - CXR consistent with pulm edema. PNA ruled out.  - 2D echo showed EF of 30 to 35% with diffuse hypokinesis -Cardiology was consulted, patient was placed on low-dose Coreg, Lasix, Entresto, spironolactone  - patient underwent cardiac cath which showed severe three-vessel disease, moderate RCA disease, high-grade stenosis of proximal LAD -CTS was consulted, patient seen by Dr. 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  Patient has been seen by [facility].  Mild acute on CKD stage III - Cr likely worsened due to diuresis, 1.3 on 12/8 - baseline cr 1-1.3 - now improving   Code Status: Full code DVT Prophylaxis: Heparin drip Family Communication: Discussed in detail with the patient, all imaging results, lab results explained to the patient    Disposition Plan: Plan for cardiac cath today  Time Spent in minutes   35-minute  Procedures:  Cardiac cath 12/9  RPDA lesion is 75% stenosed.  Post Atrio lesion is 50% stenosed.  Prox LAD lesion is 90% stenosed.  Dist LAD lesion is 90% stenosed.  Ost 1st Diag to 1st Diag lesion is 90% stenosed.  Ost 1st Mrg to 1st Mrg lesion is 80% stenosed.  Prox LAD to Mid LAD lesion is 40% stenosed.  There is moderate to severe left ventricular systolic dysfunction.  LV end diastolic pressure is normal.  There is no mitral valve regurgitation.  There is no aortic valve stenosis.   1.  Severe 3 vessel obstructive CAD. 2. Moderate to severe LV dysfunction. EF 30-35% 3. Normal LV filling pressures 4. Normal right heart pressures.5. Normal cardiac output.    Consultants:   Cardiology Cardiothoracic surgery  Antimicrobials:      Medications  Scheduled Meds: . aspirin  81 mg Oral Pre-Cath  . [MAR Hold] atorvastatin  80 mg Oral q1800  . [MAR Hold] carvedilol  6.25 mg Oral BID WC  . [MAR Hold] insulin aspart  0-15 Units Subcutaneous TID WC  . [MAR Hold] insulin aspart  0-5 Units Subcutaneous QHS  . [MAR Hold] insulin aspart  4 Units Subcutaneous TID WC  . [MAR Hold] insulin glargine  20 Units Subcutaneous Q2200  . [MAR Hold] levothyroxine  200 mcg Oral Q0600  . [MAR Hold] pregabalin  75 mg Oral BID  . [MAR Hold] sacubitril-valsartan  1 tablet Oral BID  . [MAR Hold] sodium  chloride flush  3 mL Intravenous Q12H  . sodium chloride flush  3 mL Intravenous Q12H   Continuous Infusions: . [MAR Hold] sodium chloride    . sodium chloride    . sodium chloride 50 mL/hr at 12/30/17 0909  . heparin 1,150 Units/hr (12/30/17 0441)   PRN Meds:.[MAR Hold] sodium chloride, sodium chloride, [MAR Hold] acetaminophen **OR** [MAR Hold] acetaminophen, [MAR Hold] ALPRAZolam, fentaNYL, Heparin (Porcine) in NaCl, lidocaine (PF), midazolam, [MAR Hold] morphine, [MAR Hold] ondansetron **OR** [MAR Hold] ondansetron (ZOFRAN) IV, [MAR Hold] promethazine, Radial Cocktail/Verapamil only, [MAR Hold] sodium chloride flush, sodium chloride flush   Antibiotics   Anti-infectives (From admission, onward)   None        Subjective:   Andrea [name] was seen and examined today.  No acute complaints, just feeling tired.  Awaiting catheter today.  Still has mild shortness of breath, no chest pain.  Patient denies dizziness,  abdominal pain, N/V/D/C, new weakness, numbess, tingling. No acute events overnight.    Objective:   Vitals:   12/29/17 1929 12/30/17 0519 12/30/17 0905 12/30/17 1008  BP: 128/73 100/69 118/71   Pulse: 75 77 82   Resp: 12 (!) 21    Temp: 98 F (36.7 C)     TempSrc: Oral     SpO2: 98% 98%  98%  Weight:  78.3 kg    Height:        Intake/Output Summary (Last 24 hours) at 12/30/2017 1016 Last data filed at 12/30/2017 0838 Gross per 24 hour  Intake 579.33 ml  Output 1150 ml  Net -570.67 ml     Wt Readings from Last 3 Encounters:  12/30/17 78.3 kg  03/18/17 67.1 kg  02/18/17 60 kg     Exam  General: Alert and oriented x 3, NAD  Eyes:   HEENT:  Atraumatic, normocephalic  Cardiovascular: S1 S2 auscultated, Regular rate and rhythm.  Respiratory: Clear to auscultation bilaterally, no wheezing, rales or rhonchi  Gastrointestinal: Soft, nontender, nondistended, + bowel sounds  Ext: no pedal edema bilaterally  Neuro: No neuro  deficits  Musculoskeletal: No digital cyanosis, clubbing  Skin: No rashes  Psych: Normal affect and demeanor, alert and oriented x3    Data Reviewed:  I have personally reviewed following labs and imaging studies  Micro Results No results found for this or any previous visit (from the past 240 hour(s)).  Radiology Reports Dg Chest Portable 1 View  Result Date: 12/26/2017 CLINICAL DATA:  Initial evaluation for acute chest pain. EXAM: PORTABLE CHEST 1 VIEW COMPARISON:  Prior radiograph from 02/17/2017 FINDINGS: Cardiomegaly, stable. Mediastinal silhouette  within normal limits. Lungs mildly hypoinflated. Diffuse vascular congestion with interstitial prominence, compatible with pulmonary interstitial edema. Asymmetric hazy opacity within the right lung base could reflect asymmetric edema or possibly infiltrate. No appreciable pleural effusion. No pneumothorax. No acute osseus abnormality. IMPRESSION: 1. Cardiomegaly with mild diffuse pulmonary interstitial edema. 2. Superimposed asymmetric hazy opacity within the right lung base, which could reflect asymmetric edema or possibly infiltrate. Electronically Signed   By: Jeannine Boga M.D.   On: 12/26/2017 04:13   Vas [place] Lower Extremity Venous (dvt)  Result Date: 12/28/2017  Lower Venous Study Indications: Edema.  Limitations: Poor ultrasound/tissue interface and acoustic shadowing. Performing Technologist: Landry Mellow RDMS, RVT  Examination Guidelines: A complete evaluation includes B-mode imaging, spectral Doppler, color Doppler, and power Doppler as needed of all accessible portions of each vessel. Bilateral testing is considered an integral part of a complete examination. Limited examinations for reoccurring indications may be performed as noted.  Right Venous Findings: +---------+---------------+---------+-----------+----------+-------+          CompressibilityPhasicitySpontaneityPropertiesSummary  +---------+---------------+---------+-----------+----------+-------+ CFV      Full           Yes      Yes                          +---------+---------------+---------+-----------+----------+-------+ SFJ      Full                                                 +---------+---------------+---------+-----------+----------+-------+ FV Prox  Full                                                 +---------+---------------+---------+-----------+----------+-------+ FV Mid   Full                                                 +---------+---------------+---------+-----------+----------+-------+ FV DistalFull                                                 +---------+---------------+---------+-----------+----------+-------+ PFV      Full                                                 +---------+---------------+---------+-----------+----------+-------+ POP      Full           Yes      Yes                          +---------+---------------+---------+-----------+----------+-------+ PTV      Full                                                 +---------+---------------+---------+-----------+----------+-------+  PERO     Full                                                 +---------+---------------+---------+-----------+----------+-------+  Left Venous Findings: +---------+---------------+---------+-----------+----------+-------+          CompressibilityPhasicitySpontaneityPropertiesSummary +---------+---------------+---------+-----------+----------+-------+ CFV      Full           Yes      Yes                          +---------+---------------+---------+-----------+----------+-------+ SFJ      Full                                                 +---------+---------------+---------+-----------+----------+-------+ FV Prox  Full                                                  +---------+---------------+---------+-----------+----------+-------+ FV Mid   Full                                                 +---------+---------------+---------+-----------+----------+-------+ FV DistalFull                                                 +---------+---------------+---------+-----------+----------+-------+ PFV      Full                                                 +---------+---------------+---------+-----------+----------+-------+ POP      Full           Yes      Yes                          +---------+---------------+---------+-----------+----------+-------+ PTV      Full                                                 +---------+---------------+---------+-----------+----------+-------+ PERO     Full                                                 +---------+---------------+---------+-----------+----------+-------+    Summary: Right: There is no evidence of deep vein thrombosis in the lower extremity. No cystic structure found in the popliteal fossa. Left: There is no evidence of deep vein thrombosis in the lower extremity. No cystic structure found in the popliteal fossa.  *  See table(s) above for measurements and observations. Electronically signed by Harold Barban MD on 12/28/2017 at 8:26:03 AM.    Final     Lab Data:  CBC: Recent Labs  Lab 12/26/17 0504 12/26/17 1201 12/27/17 0411 12/30/17 0351  WBC 7.4 6.7 6.1 7.0  NEUTROABS 4.6  --   --   --   HGB 11.3* 10.5* 10.1* 11.5*  HCT 34.7* 32.8* 31.6* 34.9*  MCV 92.8 92.7 93.8 89.7  PLT 205 190 185 956   Basic Metabolic Panel: Recent Labs  Lab 12/26/17 0504 12/26/17 1201 12/27/17 0411 12/28/17 0511 12/29/17 0437 12/29/17 0933 12/30/17 0351  NA 139  --  142 140 141  --  140  K 4.7  --  4.0 4.5 4.2  --  4.0  CL 102  --  103 97* 101  --  102  CO2 28  --  31 34* 32  --  28  GLUCOSE 252*  --  133* 156* 141*  --  205*  BUN 27*  --  28* 29* 27*  --  17  CREATININE 1.04*  0.97 1.16* 1.37* 1.15*  --  1.00  CALCIUM 8.9  --  8.5* 8.6* 8.3*  --  8.3*  MG  --   --   --   --   --  1.8  --    GFR: Estimated Creatinine Clearance: 57.2 mL/min (by C-G formula based on SCr of 1 mg/dL). Liver Function Tests: No results for input(s): AST, ALT, ALKPHOS, BILITOT, PROT, ALBUMIN in the last 168 hours. No results for input(s): LIPASE, AMYLASE in the last 168 hours. No results for input(s): AMMONIA in the last 168 hours. Coagulation Profile: No results for input(s): INR, PROTIME in the last 168 hours. Cardiac Enzymes: No results for input(s): CKTOTAL, CKMB, CKMBINDEX, TROPONINI in the last 168 hours. BNP (last 3 results) No results for input(s): PROBNP in the last 8760 hours. HbA1C: No results for input(s): HGBA1C in the last 72 hours. CBG: Recent Labs  Lab 12/29/17 0734 12/29/17 1205 12/29/17 1621 12/29/17 2121 12/30/17 0739  GLUCAP 118* 136* 138* 175* 222*   Lipid Profile: No results for input(s): CHOL, HDL, LDLCALC, TRIG, CHOLHDL, LDLDIRECT in the last 72 hours. Thyroid Function Tests: Recent Labs    12/29/17 0933  TSH 17.450*   Anemia Panel: No results for input(s): VITAMINB12, FOLATE, FERRITIN, TIBC, IRON, RETICCTPCT in the last 72 hours. Urine analysis:    Component Value Date/Time   COLORURINE YELLOW 06/01/2017 1108   APPEARANCEUR HAZY (A) 06/01/2017 1108   LABSPEC 1.028 06/01/2017 1108   PHURINE 5.0 06/01/2017 1108   GLUCOSEU >=500 (A) 06/01/2017 1108   HGBUR MODERATE (A) 06/01/2017 1108   BILIRUBINUR NEGATIVE 06/01/2017 1108   KETONESUR 5 (A) 06/01/2017 1108   PROTEINUR >=300 (A) 06/01/2017 1108   UROBILINOGEN 1.0 12/02/2013 1612   NITRITE NEGATIVE 06/01/2017 1108   LEUKOCYTESUR NEGATIVE 06/01/2017 1108     Ripudeep Rai M.D. [facility] 12/30/2017, 10:16 AM  Pager: [phone] Between 7am to 7pm - call Pager - [phone]  After 7pm go to [email or web address] - password [email or web address]  Call night coverage person covering after 7pm

## 2017-12-30 NOTE — Progress Notes (Signed)
Progress Note  Patient Name: Andrea [name] Date of Encounter: 12/30/2017  Primary Cardiologist: Kirk Ruths, MD   Subjective   Mild dyspnea; no CP  Inpatient Medications    Scheduled Meds: . carvedilol  6.25 mg Oral BID WC  . clopidogrel  600 mg Oral Once  . furosemide  40 mg Intravenous Daily  . insulin aspart  0-15 Units Subcutaneous TID WC  . insulin aspart  0-5 Units Subcutaneous QHS  . insulin aspart  4 Units Subcutaneous TID WC  . insulin glargine  20 Units Subcutaneous Q2200  . levothyroxine  200 mcg Oral Q0600  . pregabalin  75 mg Oral BID  . sacubitril-valsartan  1 tablet Oral BID  . sodium chloride flush  3 mL Intravenous Q12H   Continuous Infusions: . sodium chloride    . heparin 1,150 Units/hr (12/30/17 0441)   PRN Meds: sodium chloride, acetaminophen **OR** acetaminophen, ALPRAZolam, morphine, ondansetron **OR** ondansetron (ZOFRAN) IV, promethazine, sodium chloride flush   Vital Signs    Vitals:   12/29/17 1722 12/29/17 1807 12/29/17 1929 12/30/17 0519  BP: (!) 142/44 120/88 128/73 100/69  Pulse: 79 78 75 77  Resp: 19  12 (!) 21  Temp:   98 F (36.7 C)   TempSrc: Oral  Oral   SpO2: 96%  98% 98%  Weight:    78.3 kg  Height:        Intake/Output Summary (Last 24 hours) at 12/30/2017 0734 Last data filed at 12/30/2017 0300 Gross per 24 hour  Intake 579.33 ml  Output 950 ml  Net -370.67 ml   Filed Weights   12/28/17 0456 12/29/17 0640 12/30/17 0519  Weight: 75.1 kg 75.3 kg 78.3 kg    Telemetry    NSR  - Personally Reviewed  Physical Exam   GEN: WD/WN, NAD Neck: No JVD, supple Cardiac: RRR Respiratory: CTA GI: Soft, NT/ND, right groin with no hematoma and no bruit; radial cath site with no hematoma MS: No edema Neuro:  Grossly intact  Labs    Chemistry Recent Labs  Lab 12/28/17 0511 12/29/17 0437 12/30/17 0351  NA 140 141 140  K 4.5 4.2 4.0  CL 97* 101 102  CO2 34* 32 28  GLUCOSE 156* 141* 205*  BUN 29* 27*  17  CREATININE 1.37* 1.15* 1.00  CALCIUM 8.6* 8.3* 8.3*  GFRNONAA 40* 50* 59*  GFRAA 46* 57* >60  ANIONGAP 9 8 10      Hematology Recent Labs  Lab 12/26/17 1201 12/27/17 0411 12/30/17 0351  WBC 6.7 6.1 7.0  RBC 3.54* 3.37* 3.89  HGB 10.5* 10.1* 11.5*  HCT 32.8* 31.6* 34.9*  MCV 92.7 93.8 89.7  MCH 29.7 30.0 29.6  MCHC 32.0 32.0 33.0  RDW 12.9 12.9 12.4  PLT 190 185 192    Recent Labs  Lab 12/26/17 0518  TROPIPOC 0.02     BNP Recent Labs  Lab 12/26/17 0504  BNP 714.4*     DDimer  Recent Labs  Lab 12/26/17 0504  DDIMER 1.91*     Cardiac Studies   Echo 12/26/17   Study Conclusions  - Left ventricle: Apical images sub optimal due to breast implants.   The cavity size was mildly dilated. Wall thickness was increased   in a pattern of mild LVH. Systolic function was moderately to   severely reduced. The estimated ejection fraction was in the   range of 30% to 35%. Diffuse hypokinesis. Doppler parameters are   consistent with both elevated ventricular end-diastolic filling  pressure and elevated left atrial filling pressure. - Left atrium: The atrium was moderately dilated.  Patient Profile     67 y.o. female with a h/o HTN, DM, prior breast CA who is admitted with CHF. Echocardiogram shows ejection fraction 30 to 35% and moderate left atrial enlargement.  Cardiac catheterization revealed severe three-vessel coronary disease with ejection fraction 30 to 35% and normal left ventricular filling pressures.  Seen by cardiothoracic surgery and coronary artery bypass graft not recommended.  Cardiomyopathy felt related to mixed pattern of ischemic/nonischemic.  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  She appears to be euvolemic today and filling pressures normal yesterday.  We will hold Lasix today in anticipation of PCI of LAD.  Will resume Lasix and add spironolactone tomorrow if renal  function stable.  Needs fluid restriction and low-sodium diet.  Continue carvedilol and Entresto.  Titrate medications as an outpatient.  Plan repeat echocardiogram 3 months following PCI and medication titration.  If ejection fraction less than 35% would need to consider ICD.  CAD --Cardiac catheterization revealed severe three-vessel coronary artery disease.  Patient seen by cardiothoracic surgery and PCI of LAD recommended.  She has an allergy to aspirin.  She is being loaded with Plavix and will continue 75 mg daily.  Add Lipitor 80 mg daily.  Check lipids and liver in 4 weeks.  HTN  --Blood pressure controlled.  Continue present medications and follow.  DM-2  --insulin dependent  Per IM  Hypothyroid  --TSH elevated; management per IM  Mild anemia with hgb 10.1    For questions or updates, please contact [place] Please consult [email or web address] for contact info under        Signed, Kirk Ruths, MD  12/30/2017, 7:34 AM

## 2017-12-30 NOTE — Progress Notes (Signed)
[facility] for Heparin  Indication: chest pain/ACS  Allergies  Allergen Reactions  . Aspirin Nausea And Vomiting  . Maxzide [Hydrochlorothiazide W-Triamterene] Swelling    Fluid retention  . Metformin And Related Diarrhea  . Nsaids Nausea And Vomiting  . Other Other (See Comments)    All steroids produce psychosis per pt Artificial sweeteners produce nausea and upset stomach.  Gregary Cromer [Pravastatin] Other (See Comments)    unknown  . Stadol [Butorphanol] Other (See Comments)    Toradol, etc And related- hallucinations  . Toradol [Ketorolac Tromethamine] Other (See Comments)    hallucinations  . Vistaril [Hydroxyzine Hcl] Other (See Comments)    unknown  . Erythromycin Itching and Rash  . Morphine And Related Hives and Rash    Iv morphine.  . Penicillins Hives, Itching and Rash    Has patient had a PCN reaction causing immediate rash, facial/tongue/throat swelling, SOB or lightheadedness with hypotension: yes Has patient had a PCN reaction causing severe rash involving mucus membranes or skin necrosis: unknown Has patient had a PCN reaction that required hospitalization : unknown Has patient had a PCN reaction occurring within the last 10 years: no If all of the above answers are "NO", then may proceed with Cephalosporin use.     Patient Measurements: Height: 5\' 5"  (165.1 cm) Weight: 165 lb 14.4 oz (75.3 kg) IBW/kg (Calculated) : 57   Vital Signs: Temp: 98 F (36.7 C) (12/09 1929) Temp Source: Oral (12/09 1929) BP: 128/73 (12/09 1929) Pulse Rate: 75 (12/09 1929)  Labs: Recent Labs    12/28/17 0511 12/29/17 0437 12/30/17 0351  HGB  --   --  11.5*  HCT  --   --  34.9*  PLT  --   --  192  HEPARINUNFRC  --   --  0.13*  CREATININE 1.37* 1.15*  --     Estimated Creatinine Clearance: 48.8 mL/min (A) (by C-G formula based on SCr of 1.15 mg/dL (H)).   Medical History: Past Medical History:  Diagnosis Date  . Breast  cancer ([place])    bilat mastectomy and LUE lymphadenectomy 2014, chemoRx 2014 - 15  . Diabetes mellitus without complication ([place])   . Esophageal reflux   . Hypertension   . Hypothyroid   . Migraine   . Seizure [facility])       Assessment: 67yof admitted with chest tightness and LE edema - found to have multivessel CAD and possible CABG.  Start heparin 8hr after radial sheath out - out 1530 - restart at 2330 Drip rate 1000 uts/hr  12/10 AM update: heparin level low after re-start s/p cath, no plans for CABG for now, no issues per RN.  Goal of Therapy:  Heparin level 0.3-0.7 units/ml Monitor platelets by anticoagulation protocol: Yes   Plan:  -Inc heparin to 1150 units/hr -Re-check heparin level in 8 hours  [name], PharmD, [facility] Pharmacist Phone: [phone]

## 2017-12-30 NOTE — Care Management (Signed)
#    7.  S/W  HALIE   @ [facility] #  # [phone]   1.  BRILINTA  90 MG BID COVER- YES CO-PAY- $ 38.70 TIER- 2 DRUG PRIOR APPROVAL- NO   2. ENTREST0    24-26 MG BID COVER- YES CO-PAY- $ 38.70 TIER- 2 DRUG PRIOR APPROVAL- NO   PREFERRED PHARMACY : YES -  [facility]

## 2017-12-30 NOTE — Progress Notes (Signed)
TR BAND REMOVAL  LOCATION:    right radial  DEFLATED PER PROTOCOL:    Yes.    TIME BAND OFF / DRESSING APPLIED:    1500   SITE UPON ARRIVAL:    Level 0  SITE AFTER BAND REMOVAL:    Level 0  CIRCULATION SENSATION AND MOVEMENT:    Within Normal Limits   Yes.    COMMENTS:   TOLERATED PROCEDURE WELL 

## 2017-12-30 NOTE — H&P (View-Only) (Signed)
Progress Note  Patient Name: Andrea [name] Date of Encounter: 12/30/2017  Primary Cardiologist: Kirk Ruths, MD   Subjective   Mild dyspnea; no CP  Inpatient Medications    Scheduled Meds: . carvedilol  6.25 mg Oral BID WC  . clopidogrel  600 mg Oral Once  . furosemide  40 mg Intravenous Daily  . insulin aspart  0-15 Units Subcutaneous TID WC  . insulin aspart  0-5 Units Subcutaneous QHS  . insulin aspart  4 Units Subcutaneous TID WC  . insulin glargine  20 Units Subcutaneous Q2200  . levothyroxine  200 mcg Oral Q0600  . pregabalin  75 mg Oral BID  . sacubitril-valsartan  1 tablet Oral BID  . sodium chloride flush  3 mL Intravenous Q12H   Continuous Infusions: . sodium chloride    . heparin 1,150 Units/hr (12/30/17 0441)   PRN Meds: sodium chloride, acetaminophen **OR** acetaminophen, ALPRAZolam, morphine, ondansetron **OR** ondansetron (ZOFRAN) IV, promethazine, sodium chloride flush   Vital Signs    Vitals:   12/29/17 1722 12/29/17 1807 12/29/17 1929 12/30/17 0519  BP: (!) 142/44 120/88 128/73 100/69  Pulse: 79 78 75 77  Resp: 19  12 (!) 21  Temp:   98 F (36.7 C)   TempSrc: Oral  Oral   SpO2: 96%  98% 98%  Weight:    78.3 kg  Height:        Intake/Output Summary (Last 24 hours) at 12/30/2017 0734 Last data filed at 12/30/2017 0300 Gross per 24 hour  Intake 579.33 ml  Output 950 ml  Net -370.67 ml   Filed Weights   12/28/17 0456 12/29/17 0640 12/30/17 0519  Weight: 75.1 kg 75.3 kg 78.3 kg    Telemetry    NSR  - Personally Reviewed  Physical Exam   GEN: WD/WN, NAD Neck: No JVD, supple Cardiac: RRR Respiratory: CTA GI: Soft, NT/ND, right groin with no hematoma and no bruit; radial cath site with no hematoma MS: No edema Neuro:  Grossly intact  Labs    Chemistry Recent Labs  Lab 12/28/17 0511 12/29/17 0437 12/30/17 0351  NA 140 141 140  K 4.5 4.2 4.0  CL 97* 101 102  CO2 34* 32 28  GLUCOSE 156* 141* 205*  BUN 29* 27*  17  CREATININE 1.37* 1.15* 1.00  CALCIUM 8.6* 8.3* 8.3*  GFRNONAA 40* 50* 59*  GFRAA 46* 57* >60  ANIONGAP 9 8 10      Hematology Recent Labs  Lab 12/26/17 1201 12/27/17 0411 12/30/17 0351  WBC 6.7 6.1 7.0  RBC 3.54* 3.37* 3.89  HGB 10.5* 10.1* 11.5*  HCT 32.8* 31.6* 34.9*  MCV 92.7 93.8 89.7  MCH 29.7 30.0 29.6  MCHC 32.0 32.0 33.0  RDW 12.9 12.9 12.4  PLT 190 185 192    Recent Labs  Lab 12/26/17 0518  TROPIPOC 0.02     BNP Recent Labs  Lab 12/26/17 0504  BNP 714.4*     DDimer  Recent Labs  Lab 12/26/17 0504  DDIMER 1.91*     Cardiac Studies   Echo 12/26/17   Study Conclusions  - Left ventricle: Apical images sub optimal due to breast implants.   The cavity size was mildly dilated. Wall thickness was increased   in a pattern of mild LVH. Systolic function was moderately to   severely reduced. The estimated ejection fraction was in the   range of 30% to 35%. Diffuse hypokinesis. Doppler parameters are   consistent with both elevated ventricular end-diastolic filling  pressure and elevated left atrial filling pressure. - Left atrium: The atrium was moderately dilated.  Patient Profile     67 y.o. female with a h/o HTN, DM, prior breast CA who is admitted with CHF. Echocardiogram shows ejection fraction 30 to 35% and moderate left atrial enlargement.  Cardiac catheterization revealed severe three-vessel coronary disease with ejection fraction 30 to 35% and normal left ventricular filling pressures.  Seen by cardiothoracic surgery and coronary artery bypass graft not recommended.  Cardiomyopathy felt related to mixed pattern of ischemic/nonischemic.  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  She appears to be euvolemic today and filling pressures normal yesterday.  We will hold Lasix today in anticipation of PCI of LAD.  Will resume Lasix and add spironolactone tomorrow if renal  function stable.  Needs fluid restriction and low-sodium diet.  Continue carvedilol and Entresto.  Titrate medications as an outpatient.  Plan repeat echocardiogram 3 months following PCI and medication titration.  If ejection fraction less than 35% would need to consider ICD.  CAD --Cardiac catheterization revealed severe three-vessel coronary artery disease.  Patient seen by cardiothoracic surgery and PCI of LAD recommended.  She has an allergy to aspirin.  She is being loaded with Plavix and will continue 75 mg daily.  Add Lipitor 80 mg daily.  Check lipids and liver in 4 weeks.  HTN  --Blood pressure controlled.  Continue present medications and follow.  DM-2  --insulin dependent  Per IM  Hypothyroid  --TSH elevated; management per IM  Mild anemia with hgb 10.1    For questions or updates, please contact [place] Please consult [email or web address] for contact info under        Signed, Kirk Ruths, MD  12/30/2017, 7:34 AM

## 2017-12-30 NOTE — Interval H&P Note (Signed)
History and Physical Interval Note:  12/30/2017 9:49 AM  Andrea [name]  has presented today for surgery, with the diagnosis of cad  The various methods of treatment have been discussed with the patient and family. After consideration of risks, benefits and other options for treatment, the patient has consented to  Procedure(s): CORONARY STENT INTERVENTION (N/A) as a surgical intervention .  The patient's history has been reviewed, patient examined, no change in status, stable for surgery.  I have reviewed the patient's chart and labs.  Questions were answered to the patient's satisfaction.   Cath Lab Visit (complete for each Cath Lab visit)  Clinical Evaluation Leading to the Procedure:   ACS: No.  Non-ACS:    Anginal Classification: CCS II  Anti-ischemic medical therapy: Maximal Therapy (2 or more classes of medications)  Non-Invasive Test Results: High-risk stress test findings: cardiac mortality >3%/year  Prior CABG: No previous CABG        Andrea [name] [facility] 12/30/2017 9:49 AM

## 2017-12-31 ENCOUNTER — Encounter (HOSPITAL_COMMUNITY): Payer: Self-pay | Admitting: Cardiology

## 2017-12-31 ENCOUNTER — Other Ambulatory Visit: Payer: Self-pay | Admitting: Cardiology

## 2017-12-31 DIAGNOSIS — E876 Hypokalemia: Secondary | ICD-10-CM

## 2017-12-31 DIAGNOSIS — R0602 Shortness of breath: Secondary | ICD-10-CM

## 2017-12-31 DIAGNOSIS — E119 Type 2 diabetes mellitus without complications: Secondary | ICD-10-CM

## 2017-12-31 DIAGNOSIS — Z79899 Other long term (current) drug therapy: Secondary | ICD-10-CM

## 2017-12-31 DIAGNOSIS — Z955 Presence of coronary angioplasty implant and graft: Secondary | ICD-10-CM

## 2017-12-31 LAB — HEMOGLOBIN A1C
Hgb A1c MFr Bld: 7.7 % — ABNORMAL HIGH (ref 4.8–5.6)
Mean Plasma Glucose: 174.29 mg/dL

## 2017-12-31 LAB — BASIC METABOLIC PANEL
Anion gap: 8 (ref 5–15)
BUN: 11 mg/dL (ref 8–23)
CO2: 27 mmol/L (ref 22–32)
Calcium: 8.8 mg/dL — ABNORMAL LOW (ref 8.9–10.3)
Chloride: 103 mmol/L (ref 98–111)
Creatinine, Ser: 0.96 mg/dL (ref 0.44–1.00)
GFR calc Af Amer: >60 mL/min (ref >60)
GFR calc non Af Amer: >60 mL/min (ref >60)
Glucose, Bld: 177 mg/dL — ABNORMAL HIGH (ref 70–99)
Potassium: 3.7 mmol/L (ref 3.5–5.1)
Sodium: 138 mmol/L (ref 135–145)

## 2017-12-31 LAB — CBC
HCT: 35.8 % — ABNORMAL LOW (ref 36.0–46.0)
Hemoglobin: 11.7 g/dL — ABNORMAL LOW (ref 12.0–15.0)
MCH: 28.7 pg (ref 26.0–34.0)
MCHC: 32.7 g/dL (ref 30.0–36.0)
MCV: 87.7 fL (ref 80.0–100.0)
Platelets: 230 10*3/uL (ref 150–400)
RBC: 4.08 MIL/uL (ref 3.87–5.11)
RDW: 12.1 % (ref 11.5–15.5)
WBC: 8 10*3/uL (ref 4.0–10.5)
nRBC: 0 % (ref 0.0–0.2)

## 2017-12-31 LAB — GLUCOSE, CAPILLARY: Glucose-Capillary: 171 mg/dL — ABNORMAL HIGH (ref 70–99)

## 2017-12-31 MED ORDER — FUROSEMIDE 20 MG PO TABS: 20.0000 mg | ORAL_TABLET | Freq: Every day | ORAL | 3 refills | Status: DC

## 2017-12-31 MED ORDER — SPIRONOLACTONE 25 MG PO TABS: 12.5000 mg | ORAL_TABLET | Freq: Every day | ORAL | 3 refills | Status: DC

## 2017-12-31 MED ORDER — CARVEDILOL 6.25 MG PO TABS: 6.2500 mg | ORAL_TABLET | Freq: Two times a day (BID) | ORAL | 3 refills | Status: DC

## 2017-12-31 MED ORDER — TICAGRELOR 90 MG PO TABS
ORAL_TABLET | ORAL | Status: DC | PRN
  Administered 2017-12-30: 180 mg via ORAL

## 2017-12-31 MED ORDER — ATORVASTATIN CALCIUM 80 MG PO TABS: 80.0000 mg | ORAL_TABLET | Freq: Every day | ORAL | 3 refills | Status: DC

## 2017-12-31 MED ORDER — TICAGRELOR 90 MG PO TABS: 90.0000 mg | ORAL_TABLET | Freq: Two times a day (BID) | ORAL | 3 refills | Status: DC

## 2017-12-31 MED ORDER — FUROSEMIDE 20 MG PO TABS
20.0000 mg | ORAL_TABLET | Freq: Every day | ORAL | Status: DC
  Administered 2017-12-31: 10:00:00 20 mg via ORAL
  Filled 2017-12-31: qty 1

## 2017-12-31 MED ORDER — SPIRONOLACTONE 12.5 MG HALF TABLET
12.5000 mg | ORAL_TABLET | Freq: Every day | ORAL | Status: DC
  Administered 2017-12-31: 11:00:00 12.5 mg via ORAL
  Filled 2017-12-31 (×2): qty 1

## 2017-12-31 MED ORDER — SACUBITRIL-VALSARTAN 24-26 MG PO TABS: 1.0000 | ORAL_TABLET | Freq: Two times a day (BID) | ORAL | 3 refills | Status: DC

## 2017-12-31 MED FILL — BRILINTA 90 MG TABLET: 90 | 30 days supply | Qty: 60 | Fill #0 | Status: TO

## 2017-12-31 MED FILL — ENTRESTO 24 MG-26 MG TABLET: 24-26 | 30 days supply | Qty: 60 | Fill #0 | Status: TO

## 2017-12-31 MED FILL — Nitroglycerin IV Soln 100 MCG/ML in D5W: INTRA_ARTERIAL | Qty: 10 | Status: AC

## 2017-12-31 NOTE — Clinical Social Work Note (Signed)
CSW informed by nurse case manager that patient ready for discharge and cab voucher needed as family not available to provide transportation this morning. Address verified by nurse case manager and cab voucher rpvoded. CSW signing off as no there SW intervention services needed.  Andrea [name], MSW, LCSW Licensed Clinical Social Worker [place] [phone]

## 2017-12-31 NOTE — Progress Notes (Signed)
CARDIAC REHAB PHASE I   Offered to walk with pt, pt declining stating she is "taking a nap." Education attempted with pt, pt did not open her eyes during session. Pt educated on importance of Brilinta, and NTG. Stent card at bedside. Pt given HF booklet, heart healthy, low sodium and diabetic diets. Reviewed restrictions and encouraged ambulation as able. Will refer to CRP II [place]. RN made aware.  [id number] Rufina Falco, RN BSN 12/31/2017 9:24 AM

## 2017-12-31 NOTE — Discharge Summary (Signed)
Physician Discharge Summary   Patient ID: Andrea [name] MRN: [id number] DOB/AGE: 09/25/1950 67 y.o.  Admit date: 12/26/2017 Discharge date: 12/31/2017  Primary Care Physician:  [facility]   Recommendations for Outpatient Follow-up:  1. Follow up with PCP in 1-2 weeks  Home Health: None Equipment/Devices:   Discharge Condition: stable  CODE STATUS: FULL  Diet recommendation:    Discharge Diagnoses:   Acute systolic CHF, EF 30 to 86% Three-vessel coronary artery disease Mild acute on chronic CKD stage III . Hypertension . Hypothyroid . CAD (coronary artery disease) Diabetes mellitus, type II, with renal complications, CKD  Consults: Cardiology Cardiothoracic surgery    Allergies:   Allergies  Allergen Reactions  . Aspirin Nausea And Vomiting  . Maxzide [Hydrochlorothiazide W-Triamterene] Swelling    Fluid retention  . Metformin And Related Diarrhea  . Nsaids Nausea And Vomiting  . Other Other (See Comments)    All steroids produce psychosis per pt Artificial sweeteners produce nausea and upset stomach.  Gregary Cromer [Pravastatin] Other (See Comments)    unknown  . Stadol [Butorphanol] Other (See Comments)    Toradol, etc And related- hallucinations  . Toradol [Ketorolac Tromethamine] Other (See Comments)    hallucinations  . Vistaril [Hydroxyzine Hcl] Other (See Comments)    unknown  . Erythromycin Itching and Rash  . Morphine And Related Hives and Rash    Iv morphine.  . Penicillins Hives, Itching and Rash    Has patient had a PCN reaction causing immediate rash, facial/tongue/throat swelling, SOB or lightheadedness with hypotension: yes Has patient had a PCN reaction causing severe rash involving mucus membranes or skin necrosis: unknown Has patient had a PCN reaction that required hospitalization : unknown Has patient had a PCN reaction occurring within the last 10 years: no If all of the above answers are "NO", then may  proceed with Cephalosporin use.      DISCHARGE MEDICATIONS: Allergies as of 12/31/2017      Reactions   Aspirin Nausea And Vomiting   Maxzide [hydrochlorothiazide W-triamterene] Swelling   Fluid retention   Metformin And Related Diarrhea   Nsaids Nausea And Vomiting   Other Other (See Comments)   All steroids produce psychosis per pt Artificial sweeteners produce nausea and upset stomach.   Pravachol [pravastatin] Other (See Comments)   unknown   Stadol [butorphanol] Other (See Comments)   Toradol, etc And related- hallucinations   Toradol [ketorolac Tromethamine] Other (See Comments)   hallucinations   Vistaril [hydroxyzine Hcl] Other (See Comments)   unknown   Erythromycin Itching, Rash   Morphine And Related Hives, Rash   Iv morphine.   Penicillins Hives, Itching, Rash   Has patient had a PCN reaction causing immediate rash, facial/tongue/throat swelling, SOB or lightheadedness with hypotension: yes Has patient had a PCN reaction causing severe rash involving mucus membranes or skin necrosis: unknown Has patient had a PCN reaction that required hospitalization : unknown Has patient had a PCN reaction occurring within the last 10 years: no If all of the above answers are "NO", then may proceed with Cephalosporin use.      Medication List    STOP taking these medications   amLODipine 10 MG tablet Commonly known as:  NORVASC   atenolol 100 MG tablet Commonly known as:  TENORMIN   ciprofloxacin 500 MG tablet Commonly known as:  CIPRO   lisinopril 10 MG tablet Commonly known as:  PRINIVIL,ZESTRIL   metroNIDAZOLE 500 MG tablet Commonly known as:  FLAGYL  saccharomyces boulardii 250 MG capsule Commonly known as:  FLORASTOR     TAKE these medications   ALPRAZolam 0.5 MG tablet Commonly known as:  XANAX Take 0.5 mg by mouth 4 (four) times daily as needed for anxiety.   atorvastatin 80 MG tablet Commonly known as:  LIPITOR Take 1 tablet (80 mg total) by mouth  at bedtime.   carvedilol 6.25 MG tablet Commonly known as:  COREG Take 1 tablet (6.25 mg total) by mouth 2 (two) times daily with a meal.   furosemide 20 MG tablet Commonly known as:  LASIX Take 1 tablet (20 mg total) by mouth daily.   LANTUS SOLOSTAR 100 UNIT/ML Solostar Pen Generic drug:  Insulin Glargine INJECT 20 - 50 UNITS UNDER THE SKIN DAILY AS DIRECTED Notes to patient:  AS SCHEDULED    levothyroxine 200 MCG tablet Commonly known as:  SYNTHROID, LEVOTHROID Take 1 tablet (200 mcg total) by mouth at bedtime. What changed:  when to take this   MORPHABOND ER 30 MG T12a Generic drug:  Morphine Sulfate ER Take 1 tablet by mouth every 12 (twelve) hours.   oxyCODONE-acetaminophen 10-325 MG tablet Commonly known as:  PERCOCET Take 1 tablet by mouth 3 (three) times daily as needed for pain.   pregabalin 75 MG capsule Commonly known as:  LYRICA Take 1 capsule (75 mg total) 2 (two) times daily by mouth.   promethazine 25 MG tablet Commonly known as:  PHENERGAN Take 25 mg by mouth every 6 (six) hours as needed for nausea.   sacubitril-valsartan 24-26 MG Commonly known as:  ENTRESTO Take 1 tablet by mouth 2 (two) times daily.   spironolactone 25 MG tablet Commonly known as:  ALDACTONE Take 0.5 tablets (12.5 mg total) by mouth daily.   ticagrelor 90 MG Tabs tablet Commonly known as:  BRILINTA Take 1 tablet (90 mg total) by mouth 2 (two) times daily.        Brief H and P: For complete details please refer to admission H and P, but in brief Andrea [name] an 67 y.o.femalepast medical history of diabetes mellitus type 2, hypertension, history of breast cancer comes in for dyspnea on exertion progressively getting worse associated with chest tightness and a 14 pound weight gain with lower extremity edema for more than 10 days.  Hospital Course:   Acute systolic CHF (congestive heart failure) (HCC) mixed ischemic/nonischemic cardiomyopathy -Presented with  orthopnea, DOE, 14 pound weight gain, significant volume overload. - CXR consistent with pulm edema. PNA ruled out.  - 2D echo showed EF of 30 to 35% with diffuse hypokinesis -Cardiology was consulted, patient was placed on low-dose Coreg, Lasix, Entresto, spironolactone  - patient underwent cardiac cath which showed severe three-vessel disease, moderate RCA disease, high-grade stenosis of proximal LAD -CTS was consulted, patient seen by Dr. Darcey Nora, felt CABG may not help the patient at this time, as progressive heart failure with associated CAD and nonischemic cardiomyopathy from prior chemotherapy and recommended PCI of proximal LAD Patient underwent repeat cardiac cath status post PCI of LAD Cardiology recommending repeat echocardiogram 3 months following PCI, if EF less than 35% may need ICD Cleared by cardiology to be discharged home.  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  Patient has been seen by [facility]. Patient underwent cardiac cath on 12/10 with PCI of LAD She was given 30-day free card for of Entresto and Brilinta free    Mild acute on CKD stage III - Cr likely worsened due to diuresis, 1.3 on 12/8 - baseline cr 1-1.3 - now improving, 0.9 at the time of discharge.    Day of Discharge S: Feeling better, no chest pain or shortness of breath.  BP 139/83 (BP Location: Left Leg)   Pulse 93   Temp 99.3 F (37.4 C) (Oral)   Resp 20   Ht 5\' 5"  (1.651 m)   Wt 79 kg   SpO2 98%   BMI 28.98 kg/m   Physical Exam: General: Alert and awake  oriented x3 not in any acute distress. HEENT: anicteric sclera, pupils reactive to light and accommodation CVS: S1-S2 clear no murmur rubs or gallops Chest: clear to auscultation bilaterally, no wheezing rales or rhonchi Abdomen: soft nontender, nondistended, normal bowel sounds Extremities: no cyanosis, clubbing or edema noted bilaterally Neuro: Cranial nerves II-XII intact, no focal neurological deficits   The results of significant diagnostics from this hospitalization (including imaging, microbiology, ancillary and laboratory) are listed below for reference.      Procedures/Studies:  Dg Chest Portable 1 View  Result Date: 12/26/2017 CLINICAL DATA:  Initial evaluation for acute chest pain. EXAM: PORTABLE CHEST 1 VIEW COMPARISON:  Prior radiograph from 02/17/2017 FINDINGS: Cardiomegaly, stable. Mediastinal silhouette within normal limits. Lungs mildly hypoinflated. Diffuse vascular congestion with interstitial prominence, compatible with pulmonary interstitial edema. Asymmetric hazy opacity within the right lung base could reflect asymmetric edema or possibly infiltrate. No appreciable pleural effusion. No pneumothorax. No acute osseus abnormality. IMPRESSION: 1. Cardiomegaly with mild diffuse pulmonary interstitial edema. 2. Superimposed asymmetric hazy opacity within the right lung base, which could reflect asymmetric edema or possibly infiltrate. Electronically Signed   By: Jeannine Boga M.D.   On: 12/26/2017 04:13   Vas [place] Lower Extremity Venous (dvt)  Result Date: 12/28/2017  Lower Venous Study Indications: Edema.  Limitations: Poor ultrasound/tissue interface and acoustic shadowing. Performing Technologist: Landry Mellow RDMS, RVT  Examination Guidelines: A complete evaluation includes B-mode imaging, spectral Doppler, color Doppler, and power Doppler as needed of all accessible portions of each vessel. Bilateral testing is considered an integral part of a complete examination.  Limited examinations for reoccurring indications may be performed as noted.  Right Venous Findings: +---------+---------------+---------+-----------+----------+-------+          CompressibilityPhasicitySpontaneityPropertiesSummary +---------+---------------+---------+-----------+----------+-------+ CFV      Full           Yes      Yes                          +---------+---------------+---------+-----------+----------+-------+ SFJ      Full                                                 +---------+---------------+---------+-----------+----------+-------+ FV Prox  Full                                                 +---------+---------------+---------+-----------+----------+-------+  FV Mid   Full                                                 +---------+---------------+---------+-----------+----------+-------+ FV DistalFull                                                 +---------+---------------+---------+-----------+----------+-------+ PFV      Full                                                 +---------+---------------+---------+-----------+----------+-------+ POP      Full           Yes      Yes                          +---------+---------------+---------+-----------+----------+-------+ PTV      Full                                                 +---------+---------------+---------+-----------+----------+-------+ PERO     Full                                                 +---------+---------------+---------+-----------+----------+-------+  Left Venous Findings: +---------+---------------+---------+-----------+----------+-------+          CompressibilityPhasicitySpontaneityPropertiesSummary +---------+---------------+---------+-----------+----------+-------+ CFV      Full           Yes      Yes                          +---------+---------------+---------+-----------+----------+-------+ SFJ      Full                                                  +---------+---------------+---------+-----------+----------+-------+ FV Prox  Full                                                 +---------+---------------+---------+-----------+----------+-------+ FV Mid   Full                                                 +---------+---------------+---------+-----------+----------+-------+ FV DistalFull                                                 +---------+---------------+---------+-----------+----------+-------+  PFV      Full                                                 +---------+---------------+---------+-----------+----------+-------+ POP      Full           Yes      Yes                          +---------+---------------+---------+-----------+----------+-------+ PTV      Full                                                 +---------+---------------+---------+-----------+----------+-------+ PERO     Full                                                 +---------+---------------+---------+-----------+----------+-------+    Summary: Right: There is no evidence of deep vein thrombosis in the lower extremity. No cystic structure found in the popliteal fossa. Left: There is no evidence of deep vein thrombosis in the lower extremity. No cystic structure found in the popliteal fossa.  *See table(s) above for measurements and observations. Electronically signed by Harold Barban MD on 12/28/2017 at 8:26:03 AM.    Final        LAB RESULTS: Basic Metabolic Panel: Recent Labs  Lab 12/29/17 0933 12/30/17 0351 12/31/17 0417  NA  --  140 138  K  --  4.0 3.7  CL  --  102 103  CO2  --  28 27  GLUCOSE  --  205* 177*  BUN  --  17 11  CREATININE  --  1.00 0.96  CALCIUM  --  8.3* 8.8*  MG 1.8  --   --    Liver Function Tests: No results for input(s): AST, ALT, ALKPHOS, BILITOT, PROT, ALBUMIN in the last 168 hours. No results for input(s): LIPASE, AMYLASE in the last 168  hours. No results for input(s): AMMONIA in the last 168 hours. CBC: Recent Labs  Lab 12/26/17 0504  12/30/17 0351 12/31/17 0417  WBC 7.4   < > 7.0 8.0  NEUTROABS 4.6  --   --   --   HGB 11.3*   < > 11.5* 11.7*  HCT 34.7*   < > 34.9* 35.8*  MCV 92.8   < > 89.7 87.7  PLT 205   < > 192 230   < > = values in this interval not displayed.   Cardiac Enzymes: No results for input(s): CKTOTAL, CKMB, CKMBINDEX, TROPONINI in the last 168 hours. BNP: Invalid input(s): POCBNP CBG: Recent Labs  Lab 12/30/17 2154 12/31/17 0644  GLUCAP 156* 171*      Disposition and Follow-up: Discharge Instructions    (HEART FAILURE PATIENTS) Call MD:  Anytime you have any of the following symptoms: 1) 3 pound weight gain in 24 hours or 5 pounds in 1 week 2) shortness of breath, with or without a dry hacking cough 3) swelling in the hands, feet or stomach 4) if you have to sleep on extra pillows at night  in order to breathe.   Complete by:  As directed    Amb Referral to Cardiac Rehabilitation   Complete by:  As directed    Diagnosis:  Coronary Stents   Diet - low sodium heart healthy   Complete by:  As directed    Increase activity slowly   Complete by:  As directed        DISPOSITION: [facility]. Schedule an appointment as soon as possible for a visit in 2 week(s).   Specialty:  Family Medicine Contact information: [place] [address] [phone]        Lelon Perla, MD. Schedule an appointment as soon as possible for a visit in 2 week(s).   Specialty:  Cardiology Contact information: [address] [place] [address] [phone]            Time coordinating discharge:  40 minutes  Signed:   Estill Cotta M.D. [facility] 12/31/2017, 11:22 AM Pager: [phone]

## 2017-12-31 NOTE — Care Management Note (Signed)
Case Management Note  Patient Details  Name: Andrea [name] MRN: [id number] Date of Birth: 11/14/1950  Subjective/Objective:   From home alone, s/p stent intervention, will be on brilinta and Entresto, NCM informed patient of the copay fo 38.70 for each refill of each medication.  [facility] will fill her scripts for her today.  She will also need a cab voucher to get home, NCM informed CSW of this information.  She will get patient a cab voucher.                  Action/Plan: DC home when ready.   Expected Discharge Date:  12/31/17               Expected Discharge Plan:  Home/Self Care  In-House Referral:  Clinical Social Work  Discharge planning Services  CM Consult, Medication Assistance  Post Acute Care Choice:    Choice offered to:     DME Arranged:    DME Agency:     HH Arranged:    HH Agency:     Status of Service:  Completed, signed off  If discussed at [employer], dates discussed:    Additional Comments:  Zenon Mayo, RN 12/31/2017, 9:50 AM

## 2017-12-31 NOTE — Progress Notes (Signed)
Progress Note  Patient Name: Andrea [name] Date of Encounter: 12/31/2017  Primary Cardiologist: Kirk Ruths, MD   Subjective   Pt denies CP; mild dyspnea  Inpatient Medications    Scheduled Meds: . atorvastatin  80 mg Oral q1800  . carvedilol  6.25 mg Oral BID WC  . enoxaparin (LOVENOX) injection  40 mg Subcutaneous Q24H  . insulin aspart  0-15 Units Subcutaneous TID WC  . insulin aspart  0-5 Units Subcutaneous QHS  . insulin aspart  4 Units Subcutaneous TID WC  . insulin glargine  20 Units Subcutaneous Q2200  . levothyroxine  200 mcg Oral Q0600  . pregabalin  75 mg Oral BID  . sacubitril-valsartan  1 tablet Oral BID  . sodium chloride flush  3 mL Intravenous Q12H  . sodium chloride flush  3 mL Intravenous Q12H  . ticagrelor  90 mg Oral BID   Continuous Infusions: . sodium chloride    . sodium chloride     PRN Meds: sodium chloride, sodium chloride, acetaminophen **OR** acetaminophen, ALPRAZolam, morphine, ondansetron **OR** ondansetron (ZOFRAN) IV, promethazine, sodium chloride flush, sodium chloride flush   Vital Signs    Vitals:   12/30/17 2000 12/30/17 2004 12/30/17 2218 12/31/17 0646  BP:  130/78  107/68  Pulse: 79 83 78 90  Resp: 14 (!) 22 (!) 21 (!) 21  Temp:  97.9 F (36.6 C)  97.7 F (36.5 C)  TempSrc:  Oral  Oral  SpO2: 100% 100% 100% 97%  Weight:    79 kg  Height:        Intake/Output Summary (Last 24 hours) at 12/31/2017 0750 Last data filed at 12/31/2017 0657 Gross per 24 hour  Intake 540 ml  Output 1450 ml  Net -910 ml   Filed Weights   12/29/17 0640 12/30/17 0519 12/31/17 0646  Weight: 75.3 kg 78.3 kg 79 kg    Telemetry    Sinus  - Personally Reviewed  Physical Exam   GEN: NAD Neck: supple Cardiac: RRR, no murmur Respiratory: CTA; no wheeze GI: Soft, NT/ND, groin with no hematoma and no bruit MS: No edema Neuro:  No focal findings  Labs    Chemistry Recent Labs  Lab 12/29/17 0437 12/30/17 0351  12/31/17 0417  NA 141 140 138  K 4.2 4.0 3.7  CL 101 102 103  CO2 32 28 27  GLUCOSE 141* 205* 177*  BUN 27* 17 11  CREATININE 1.15* 1.00 0.96  CALCIUM 8.3* 8.3* 8.8*  GFRNONAA 50* 59* >60  GFRAA 57* >60 >60  ANIONGAP 8 10 8      Hematology Recent Labs  Lab 12/27/17 0411 12/30/17 0351 12/31/17 0417  WBC 6.1 7.0 8.0  RBC 3.37* 3.89 4.08  HGB 10.1* 11.5* 11.7*  HCT 31.6* 34.9* 35.8*  MCV 93.8 89.7 87.7  MCH 30.0 29.6 28.7  MCHC 32.0 33.0 32.7  RDW 12.9 12.4 12.1  PLT 185 192 230    Recent Labs  Lab 12/26/17 0518  TROPIPOC 0.02     BNP Recent Labs  Lab 12/26/17 0504  BNP 714.4*     DDimer  Recent Labs  Lab 12/26/17 0504  DDIMER 1.91*     Cardiac Studies   Echo 12/26/17   Study Conclusions  - Left ventricle: Apical images sub optimal due to breast implants.   The cavity size was mildly dilated. Wall thickness was increased   in a pattern of mild LVH. Systolic function was moderately to   severely reduced. The estimated ejection fraction  was in the   range of 30% to 35%. Diffuse hypokinesis. Doppler parameters are   consistent with both elevated ventricular end-diastolic filling   pressure and elevated left atrial filling pressure. - Left atrium: The atrium was moderately dilated.  Patient Profile     67 y.o. female with a h/o HTN, DM, prior breast CA who is admitted with CHF. Echocardiogram shows ejection fraction 30 to 35% and moderate left atrial enlargement.  Cardiac catheterization revealed severe three-vessel coronary disease with ejection fraction 30 to 35% and normal left ventricular filling pressures.  Seen by cardiothoracic surgery and coronary artery bypass graft not recommended.  Cardiomyopathy felt related to mixed pattern of ischemic/nonischemic.  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  She appears to be euvolemic today.  Resume Lasix at 20 mg daily and Spironolactone  12.5 mg daily.  Needs fluid restriction and low-sodium diet.  Continue carvedilol and Entresto.  Titrate medications as an outpatient.  Plan repeat echocardiogram 3 months following PCI and medication titration.  If ejection fraction less than 35% would need to consider ICD.  CAD --Patient is status post PCI of LAD.  No chest pain.  Plan to continue medical therapy.  She has an allergy to aspirin.  We are therefore treating with Brilinta.  Continue statin.  HTN  --Blood pressure controlled.  Continue present medications and follow.  DM-2  --insulin dependent  Per IM  Hypothyroid  --TSH elevated; follow-up internal medicine.  Patient can be discharged from a cardiac standpoint. [facility] will sign off.   Medication Recommendations: Continue medications as outlined in MAR. Other recommendations (labs, testing, etc): Check potassium and renal function 1 week following discharge. Follow up as an outpatient: We will arrange transition of care appointment with APP 1 week.  Follow-up with me 3 months.  For questions or updates, please contact [place] Please consult [email or web address] for contact info under        Signed, Kirk Ruths, MD  12/31/2017, 7:50 AM

## 2018-01-01 ENCOUNTER — Telehealth: Payer: Self-pay | Admitting: Cardiology

## 2018-01-01 NOTE — Telephone Encounter (Signed)
TOC call no answer.LMTC. 

## 2018-01-01 NOTE — Telephone Encounter (Signed)
New Message   Patient has a TOC appt 12/19 with Kerin Ransom.

## 2018-01-02 ENCOUNTER — Other Ambulatory Visit: Payer: Self-pay | Admitting: Cardiology

## 2018-01-02 MED ORDER — FUROSEMIDE 20 MG PO TABS: 20.0000 mg | ORAL_TABLET | Freq: Every day | ORAL | 3 refills | Status: DC

## 2018-01-02 MED ORDER — TICAGRELOR 90 MG PO TABS: 90.0000 mg | ORAL_TABLET | Freq: Two times a day (BID) | ORAL | 3 refills | Status: DC

## 2018-01-02 MED ORDER — ATORVASTATIN CALCIUM 80 MG PO TABS: 80.0000 mg | ORAL_TABLET | Freq: Every day | ORAL | 3 refills | Status: DC

## 2018-01-02 MED ORDER — SPIRONOLACTONE 25 MG PO TABS: 12.5000 mg | ORAL_TABLET | Freq: Every day | ORAL | 3 refills | Status: DC

## 2018-01-02 MED ORDER — SACUBITRIL-VALSARTAN 24-26 MG PO TABS: 1.0000 | ORAL_TABLET | Freq: Two times a day (BID) | ORAL | 3 refills | Status: DC

## 2018-01-02 MED ORDER — CARVEDILOL 6.25 MG PO TABS: 6.2500 mg | ORAL_TABLET | Freq: Two times a day (BID) | ORAL | 3 refills | Status: DC

## 2018-01-02 NOTE — Telephone Encounter (Signed)
Called the number on file, someone answered and states it is the wrong number.  LM2CB-emergency contact-Christopher Viverette. Pt should have refills at the pharmacy Called the pharmacy pt did not bring in the written rx's.  We are unable to fill since we have not actually seen the pt. She will need to call PCP for fills or find her written rx given to her at the hospital

## 2018-01-02 NOTE — Telephone Encounter (Signed)
Patient called from phone [phone].  This number was not in the system and was entered.  The patient states that she needs her prescriptions because they were not in her discharge paperwork.  She states she has looked 5 times for them.  She states she is getting anxious and upset because she cannot find them.  I stated I needed to research this and would call her back.  I re-prescribed the cardiac drugs including Lipitor, Coreg, Lasix, Spironolactone and Brilinta electronically and [facility] them to [facility].  I called her back and she did not answer, but I was able to leave a voicemail.  Rosaria Ferries, PA-C 01/02/2018 6:03 PM Beeper [phone]

## 2018-01-02 NOTE — Telephone Encounter (Signed)
° ° °*  STAT* If patient is at the pharmacy, call can be transferred to refill team.   1. Which medications need to be refilled? (please list name of each medication and dose if known) atorvastatin (LIPITOR) 80 MG tablet, carvedilol (COREG) 6.25 MG tablet, furosemide (LASIX) 20 MG tablet, spironolactone (ALDACTONE) 25 MG tablet  2. Which pharmacy/location (including street and city if local pharmacy) is medication to be sent to? [id number] [place], [place] - [address]  3. Do they need a 30 day or 90 day supply? [place]

## 2018-01-02 NOTE — Telephone Encounter (Signed)
Called the number on file, someone answered and states it is the wrong number.  Patient does not have a mychart on file.

## 2018-01-04 ENCOUNTER — Other Ambulatory Visit: Payer: Self-pay

## 2018-01-04 ENCOUNTER — Observation Stay (HOSPITAL_COMMUNITY)
Admission: EM | Admit: 2018-01-04 | Discharge: 2018-01-05 | Disposition: A | Discharge status: Home / Self Care | Payer: Medicare Other | Attending: Internal Medicine | Admitting: Internal Medicine

## 2018-01-04 ENCOUNTER — Emergency Department (HOSPITAL_COMMUNITY): Payer: Medicare Other

## 2018-01-04 ENCOUNTER — Encounter (HOSPITAL_COMMUNITY): Payer: Self-pay

## 2018-01-04 DIAGNOSIS — I110 Hypertensive heart disease with heart failure: Secondary | ICD-10-CM | POA: Insufficient documentation

## 2018-01-04 DIAGNOSIS — Z79890 Hormone replacement therapy (postmenopausal): Secondary | ICD-10-CM | POA: Diagnosis not present

## 2018-01-04 DIAGNOSIS — Z79899 Other long term (current) drug therapy: Secondary | ICD-10-CM | POA: Diagnosis not present

## 2018-01-04 DIAGNOSIS — I700 Atherosclerosis of aorta: Secondary | ICD-10-CM | POA: Insufficient documentation

## 2018-01-04 DIAGNOSIS — IMO0001 Reserved for inherently not codable concepts without codable children: Secondary | ICD-10-CM

## 2018-01-04 DIAGNOSIS — I5023 Acute on chronic systolic (congestive) heart failure: Secondary | ICD-10-CM | POA: Insufficient documentation

## 2018-01-04 DIAGNOSIS — E119 Type 2 diabetes mellitus without complications: Secondary | ICD-10-CM | POA: Insufficient documentation

## 2018-01-04 DIAGNOSIS — E039 Hypothyroidism, unspecified: Secondary | ICD-10-CM | POA: Insufficient documentation

## 2018-01-04 DIAGNOSIS — K219 Gastro-esophageal reflux disease without esophagitis: Secondary | ICD-10-CM | POA: Diagnosis not present

## 2018-01-04 DIAGNOSIS — I25119 Atherosclerotic heart disease of native coronary artery with unspecified angina pectoris: Principal | ICD-10-CM | POA: Insufficient documentation

## 2018-01-04 DIAGNOSIS — Z9882 Breast implant status: Secondary | ICD-10-CM | POA: Insufficient documentation

## 2018-01-04 DIAGNOSIS — G8929 Other chronic pain: Secondary | ICD-10-CM | POA: Diagnosis not present

## 2018-01-04 DIAGNOSIS — Z794 Long term (current) use of insulin: Secondary | ICD-10-CM | POA: Insufficient documentation

## 2018-01-04 DIAGNOSIS — Z9013 Acquired absence of bilateral breasts and nipples: Secondary | ICD-10-CM | POA: Diagnosis not present

## 2018-01-04 DIAGNOSIS — Z955 Presence of coronary angioplasty implant and graft: Secondary | ICD-10-CM | POA: Diagnosis not present

## 2018-01-04 DIAGNOSIS — G43909 Migraine, unspecified, not intractable, without status migrainosus: Secondary | ICD-10-CM | POA: Diagnosis not present

## 2018-01-04 DIAGNOSIS — G40909 Epilepsy, unspecified, not intractable, without status epilepticus: Secondary | ICD-10-CM | POA: Diagnosis not present

## 2018-01-04 DIAGNOSIS — I209 Angina pectoris, unspecified: Secondary | ICD-10-CM | POA: Diagnosis not present

## 2018-01-04 DIAGNOSIS — I10 Essential (primary) hypertension: Secondary | ICD-10-CM | POA: Diagnosis present

## 2018-01-04 DIAGNOSIS — R0600 Dyspnea, unspecified: Secondary | ICD-10-CM

## 2018-01-04 DIAGNOSIS — Z853 Personal history of malignant neoplasm of breast: Secondary | ICD-10-CM | POA: Insufficient documentation

## 2018-01-04 DIAGNOSIS — R079 Chest pain, unspecified: Secondary | ICD-10-CM | POA: Diagnosis present

## 2018-01-04 DIAGNOSIS — I5022 Chronic systolic (congestive) heart failure: Secondary | ICD-10-CM

## 2018-01-04 DIAGNOSIS — I255 Ischemic cardiomyopathy: Secondary | ICD-10-CM | POA: Insufficient documentation

## 2018-01-04 DIAGNOSIS — I509 Heart failure, unspecified: Secondary | ICD-10-CM

## 2018-01-04 LAB — CBC WITH DIFFERENTIAL/PLATELET
ABS IMMATURE GRANULOCYTES: 0.01 10*3/uL (ref 0.00–0.07)
Basophils Absolute: 0 10*3/uL (ref 0.0–0.1)
Basophils Relative: 0 %
Eosinophils Absolute: 0.5 10*3/uL (ref 0.0–0.5)
Eosinophils Relative: 5 %
HCT: 34.8 % — ABNORMAL LOW (ref 36.0–46.0)
Hemoglobin: 10.9 g/dL — ABNORMAL LOW (ref 12.0–15.0)
IMMATURE GRANULOCYTES: 0 %
Lymphocytes Relative: 28 %
Lymphs Abs: 2.6 10*3/uL (ref 0.7–4.0)
MCH: 29 pg (ref 26.0–34.0)
MCHC: 31.3 g/dL (ref 30.0–36.0)
MCV: 92.6 fL (ref 80.0–100.0)
Monocytes Absolute: 0.9 10*3/uL (ref 0.1–1.0)
Monocytes Relative: 9 %
NEUTROS PCT: 58 %
Neutro Abs: 5.3 10*3/uL (ref 1.7–7.7)
PLATELETS: 244 10*3/uL (ref 150–400)
RBC: 3.76 MIL/uL — ABNORMAL LOW (ref 3.87–5.11)
RDW: 12.5 % (ref 11.5–15.5)
WBC: 9.3 10*3/uL (ref 4.0–10.5)
nRBC: 0 % (ref 0.0–0.2)

## 2018-01-04 LAB — BASIC METABOLIC PANEL
Anion gap: 14 (ref 5–15)
BUN: 23 mg/dL (ref 8–23)
CO2: 23 mmol/L (ref 22–32)
Calcium: 9 mg/dL (ref 8.9–10.3)
Chloride: 103 mmol/L (ref 98–111)
Creatinine, Ser: 1.05 mg/dL — ABNORMAL HIGH (ref 0.44–1.00)
GFR calc Af Amer: >60 mL/min (ref >60)
GFR calc non Af Amer: 55 mL/min — ABNORMAL LOW (ref >60)
Glucose, Bld: 189 mg/dL — ABNORMAL HIGH (ref 70–99)
Potassium: 4.3 mmol/L (ref 3.5–5.1)
Sodium: 140 mmol/L (ref 135–145)

## 2018-01-04 LAB — I-STAT TROPONIN, ED: Troponin i, poc: 0.27 ng/mL (ref 0.00–0.08)

## 2018-01-04 LAB — HEPARIN LEVEL (UNFRACTIONATED): HEPARIN UNFRACTIONATED: 0.12 [IU]/mL — AB (ref 0.30–0.70)

## 2018-01-04 LAB — PROTIME-INR
INR: 0.93
Prothrombin Time: 12.4 seconds (ref 11.4–15.2)

## 2018-01-04 LAB — BRAIN NATRIURETIC PEPTIDE: B Natriuretic Peptide: 333.2 pg/mL — ABNORMAL HIGH (ref 0.0–100.0)

## 2018-01-04 MED ORDER — HEPARIN SODIUM (PORCINE) 5000 UNIT/ML IJ SOLN
4000.0000 [IU] | Freq: Once | INTRAMUSCULAR | Status: AC
  Administered 2018-01-04: 4000 [IU] via INTRAVENOUS

## 2018-01-04 MED ORDER — SPIRONOLACTONE 12.5 MG HALF TABLET
12.5000 mg | ORAL_TABLET | Freq: Every day | ORAL | Status: DC
  Administered 2018-01-05: 12.5 mg via ORAL
  Filled 2018-01-04: qty 1

## 2018-01-04 MED ORDER — SODIUM CHLORIDE 0.9% FLUSH: 3.0000 mL | INTRAVENOUS | Status: DC | PRN

## 2018-01-04 MED ORDER — LEVOTHYROXINE SODIUM 100 MCG PO TABS
200.0000 ug | ORAL_TABLET | Freq: Every day | ORAL | Status: DC
  Administered 2018-01-05: 200 ug via ORAL
  Filled 2018-01-04: qty 2

## 2018-01-04 MED ORDER — PREGABALIN 75 MG PO CAPS
75.0000 mg | ORAL_CAPSULE | Freq: Two times a day (BID) | ORAL | Status: DC
  Administered 2018-01-04 – 2018-01-05 (×2): 75 mg via ORAL
  Filled 2018-01-04 (×2): qty 1

## 2018-01-04 MED ORDER — CARVEDILOL 6.25 MG PO TABS
6.2500 mg | ORAL_TABLET | Freq: Two times a day (BID) | ORAL | Status: DC
  Administered 2018-01-05: 6.25 mg via ORAL
  Filled 2018-01-04: qty 1

## 2018-01-04 MED ORDER — INSULIN GLARGINE 100 UNIT/ML SOLOSTAR PEN: 20.0000 [IU] | PEN_INJECTOR | Freq: Every day | SUBCUTANEOUS | Status: DC

## 2018-01-04 MED ORDER — OXYCODONE-ACETAMINOPHEN 10-325 MG PO TABS: 1.0000 | ORAL_TABLET | Freq: Four times a day (QID) | ORAL | Status: DC | PRN

## 2018-01-04 MED ORDER — ALPRAZOLAM 0.5 MG PO TABS
0.5000 mg | ORAL_TABLET | Freq: Four times a day (QID) | ORAL | Status: DC | PRN
  Administered 2018-01-04 – 2018-01-05 (×2): 0.5 mg via ORAL
  Filled 2018-01-04 (×2): qty 1

## 2018-01-04 MED ORDER — SODIUM CHLORIDE 0.9 % IV SOLN
250.0000 mL | INTRAVENOUS | Status: DC | PRN
  Administered 2018-01-04: 250 mL via INTRAVENOUS

## 2018-01-04 MED ORDER — INSULIN ASPART 100 UNIT/ML ~~LOC~~ SOLN
0.0000 [IU] | Freq: Three times a day (TID) | SUBCUTANEOUS | Status: DC
  Administered 2018-01-05: 2 [IU] via SUBCUTANEOUS
  Administered 2018-01-05: 3 [IU] via SUBCUTANEOUS

## 2018-01-04 MED ORDER — TICAGRELOR 90 MG PO TABS
90.0000 mg | ORAL_TABLET | Freq: Two times a day (BID) | ORAL | Status: DC
  Administered 2018-01-04 – 2018-01-05 (×2): 90 mg via ORAL
  Filled 2018-01-04 (×2): qty 1

## 2018-01-04 MED ORDER — PROMETHAZINE HCL 25 MG PO TABS: 25.0000 mg | ORAL_TABLET | Freq: Four times a day (QID) | ORAL | Status: DC | PRN

## 2018-01-04 MED ORDER — HEPARIN BOLUS VIA INFUSION
2000.0000 [IU] | Freq: Once | INTRAVENOUS | Status: AC
  Administered 2018-01-04: 2000 [IU] via INTRAVENOUS
  Filled 2018-01-04: qty 2000

## 2018-01-04 MED ORDER — OXYCODONE HCL 5 MG PO TABS
5.0000 mg | ORAL_TABLET | Freq: Four times a day (QID) | ORAL | Status: DC | PRN
  Administered 2018-01-04 – 2018-01-05 (×3): 5 mg via ORAL
  Filled 2018-01-04 (×3): qty 1

## 2018-01-04 MED ORDER — FUROSEMIDE 10 MG/ML IJ SOLN
40.0000 mg | Freq: Two times a day (BID) | INTRAMUSCULAR | Status: DC
  Administered 2018-01-05: 40 mg via INTRAVENOUS
  Filled 2018-01-04: qty 4

## 2018-01-04 MED ORDER — DIVALPROEX SODIUM 500 MG PO DR TAB
1000.0000 mg | DELAYED_RELEASE_TABLET | Freq: Every day | ORAL | Status: DC
  Administered 2018-01-04: 1000 mg via ORAL
  Filled 2018-01-04: qty 2

## 2018-01-04 MED ORDER — SACUBITRIL-VALSARTAN 24-26 MG PO TABS
1.0000 | ORAL_TABLET | Freq: Two times a day (BID) | ORAL | Status: DC
  Administered 2018-01-05 (×2): 1 via ORAL
  Filled 2018-01-04 (×3): qty 1

## 2018-01-04 MED ORDER — SODIUM CHLORIDE 0.9% FLUSH: 3.0000 mL | Freq: Two times a day (BID) | INTRAVENOUS | Status: DC

## 2018-01-04 MED ORDER — OXYCODONE-ACETAMINOPHEN 5-325 MG PO TABS
1.0000 | ORAL_TABLET | Freq: Four times a day (QID) | ORAL | Status: DC | PRN
  Administered 2018-01-04 – 2018-01-05 (×3): 1 via ORAL
  Filled 2018-01-04 (×3): qty 1

## 2018-01-04 MED ORDER — ELDERBERRY 575 MG/5ML PO SYRP: 1.0000 | ORAL_SOLUTION | Freq: Every day | ORAL | Status: DC

## 2018-01-04 MED ORDER — INSULIN GLARGINE 100 UNIT/ML ~~LOC~~ SOLN
20.0000 [IU] | Freq: Every day | SUBCUTANEOUS | Status: DC
  Administered 2018-01-05: 20 [IU] via SUBCUTANEOUS
  Filled 2018-01-04: qty 0.2

## 2018-01-04 MED ORDER — IOPAMIDOL (ISOVUE-370) INJECTION 76%
100.0000 mL | Freq: Once | INTRAVENOUS | Status: AC | PRN
  Administered 2018-01-04: 75 mL via INTRAVENOUS

## 2018-01-04 MED ORDER — HEPARIN (PORCINE) 25000 UT/250ML-% IV SOLN
1100.0000 [IU]/h | INTRAVENOUS | Status: DC
  Administered 2018-01-04: 850 [IU]/h via INTRAVENOUS
  Administered 2018-01-05: 1100 [IU]/h via INTRAVENOUS
  Filled 2018-01-04 (×2): qty 250

## 2018-01-04 MED ORDER — ATORVASTATIN CALCIUM 80 MG PO TABS
80.0000 mg | ORAL_TABLET | Freq: Every day | ORAL | Status: DC
  Administered 2018-01-04: 80 mg via ORAL
  Filled 2018-01-04: qty 1

## 2018-01-04 NOTE — ED Notes (Signed)
Per lab, CBC clotted, will need a redraw for CBC/BNP. Phlebotomy aware of need to collect specimen.

## 2018-01-04 NOTE — Consult Note (Addendum)
Cardiology Consultation:   Patient ID: Andrea [name]; [id number]; 1950-06-10   Admit date: 01/04/2018 Date of Consult: 01/04/2018  Primary Care Provider: Medicine, [facility] Primary Cardiologist: Dr. Kirk Ruths, MD  Patient Profile:   Andrea [name] is a 67 y.o. female with a hx of HTN, DM and prior breast CA who is being seen today for the evaluation of chest pain and shortness of breath the request of Dr. Francia Greaves.   History of Present Illness:   Andrea [name] is a 67 year old female with a history stated above who presented to [facility] on 01/04/2018 with SOB and chest pain which began last Wednesday evening. She states that she began having mid sternal chest pain without radiation Wednesday however this progressed over the last several days and has exertional features. She was recently seen and evaluated by our team 12/28/2017 in consultation for shortness of breath, chest tightness and edema found to be markedly fluid volume overloaded.  She was also found to have a newly depressed EF of 30 to 35% by echocardiogram LV unclear etiology. Given this, she underwent a RHC/LHC 12/29/2017 which showed severe three-vessel CAD and was referred to cardiothoracic surgery for possible coronary artery bypass grafting however this was not recommended. She then went for a coronary stent intervention on 12/30/2016 in which a successful PCI to the proximal and distal LAD with DES x2 was completed. Patient has a known allergy to ASA and it was recommended she continue Brilinta monotherapy 90 mg twice daily for 1 year then 60 mg twice daily long-term with plans to treat her residual CAD with medical therapy. Cardiomyopathy was felt to be mixed ischemic and nonischemic etiology and heart failure medications were optimized.  In talking with her today, she states that she was not discharged with her medications. She reports that the pharmacy attempted to have her meds delivered to  her bedside however she did not have the money to pay for them. Per chart review, she may have been discharged with written prescriptions and she was unable to find them after returning home. She then called the OP answering service and her medications were sent to her preferred pharmacy. She was without Brilinta for 3 days.  Cardiology has been asked to evaluate for further assistance.   Past Medical History:  Diagnosis Date  . Breast cancer ([place])    bilat mastectomy and LUE lymphadenectomy 2014, chemoRx 2014 - 15  . Diabetes mellitus without complication ([place])   . Esophageal reflux   . Hypertension   . Hypothyroid   . Migraine   . Seizure [facility])     Past Surgical History:  Procedure Laterality Date  . ABDOMINAL HYSTERECTOMY     1984 , done for ruptured cyst and endometriosis  . APPENDECTOMY    . CHOLECYSTECTOMY     1980's  . CORONARY STENT INTERVENTION N/A 12/30/2017   Procedure: CORONARY STENT INTERVENTION;  Surgeon: Martinique, Peter M, MD;  Location: [place] [facility];  Service: Cardiovascular;  Laterality: N/A;  . EYE SURGERY  08/28/2015   Right eye  . KNEE SURGERY Left   . MASTECTOMY     bilateral  . Open surgery for bowel obstruction, 2000's    . RIGHT/LEFT HEART CATH AND CORONARY ANGIOGRAPHY N/A 12/29/2017   Procedure: RIGHT/LEFT HEART CATH AND CORONARY ANGIOGRAPHY;  Surgeon: Martinique, Peter M, MD;  Location: [place] [facility];  Service: Cardiovascular;  Laterality: N/A;     Prior to Admission medications   Medication Sig Start  Date End Date Taking? Authorizing Provider  ALPRAZolam Duanne Moron) 0.5 MG tablet Take 0.5 mg by mouth 4 (four) times daily as needed for anxiety.  11/21/16   [provider]  atorvastatin (LIPITOR) 80 MG tablet Take 1 tablet (80 mg total) by mouth at bedtime. 01/02/18   Barrett, Evelene Croon, PA-C  carvedilol (COREG) 6.25 MG tablet Take 1 tablet (6.25 mg total) by mouth 2 (two) times daily with a meal. 01/02/18   Barrett, Evelene Croon, PA-C  furosemide  (LASIX) 20 MG tablet Take 1 tablet (20 mg total) by mouth daily. 01/02/18   Barrett, Evelene Croon, PA-C  LANTUS SOLOSTAR 100 UNIT/ML Solostar Pen INJECT 20 - 50 UNITS UNDER THE SKIN DAILY AS DIRECTED 03/20/17   [provider]  levothyroxine (SYNTHROID, LEVOTHROID) 200 MCG tablet Take 1 tablet (200 mcg total) by mouth at bedtime. Patient taking differently: Take 200 mcg by mouth daily before breakfast.  01/12/17   Lavina Hamman, MD  [facility] 30 MG T12A Take 1 tablet by mouth every 12 (twelve) hours. 12/15/17   [provider]  oxyCODONE-acetaminophen (PERCOCET) 10-325 MG tablet Take 1 tablet by mouth 3 (three) times daily as needed for pain. 02/03/17   [provider]  pregabalin (LYRICA) 75 MG capsule Take 1 capsule (75 mg total) 2 (two) times daily by mouth. 11/24/16   Benito Mccreedy, MD  promethazine (PHENERGAN) 25 MG tablet Take 25 mg by mouth every 6 (six) hours as needed for nausea.  05/30/17   [provider]  sacubitril-valsartan (ENTRESTO) 24-26 MG Take 1 tablet by mouth 2 (two) times daily. 01/02/18   Barrett, Evelene Croon, PA-C  spironolactone (ALDACTONE) 25 MG tablet Take 0.5 tablets (12.5 mg total) by mouth daily. 01/02/18   Barrett, Evelene Croon, PA-C  ticagrelor (BRILINTA) 90 MG TABS tablet Take 1 tablet (90 mg total) by mouth 2 (two) times daily. 01/02/18   Barrett, Evelene Croon, PA-C    Inpatient Medications: Scheduled Meds: . heparin  4,000 Units Intravenous Once   Continuous Infusions: . heparin     PRN Meds:   Allergies:    Allergies  Allergen Reactions  . Aspirin Nausea And Vomiting  . Maxzide [Hydrochlorothiazide W-Triamterene] Swelling    Fluid retention  . Metformin And Related Diarrhea  . Nsaids Nausea And Vomiting  . Other Other (See Comments)    All steroids produce psychosis per pt Artificial sweeteners produce nausea and upset stomach.  Gregary Cromer [Pravastatin] Other (See Comments)    unknown  . Stadol [Butorphanol] Other  (See Comments)    Toradol, etc And related- hallucinations  . Toradol [Ketorolac Tromethamine] Other (See Comments)    hallucinations  . Vistaril [Hydroxyzine Hcl] Other (See Comments)    unknown  . Erythromycin Itching and Rash  . Morphine And Related Hives and Rash    Iv morphine.  . Penicillins Hives, Itching and Rash    Has patient had a PCN reaction causing immediate rash, facial/tongue/throat swelling, SOB or lightheadedness with hypotension: yes Has patient had a PCN reaction causing severe rash involving mucus membranes or skin necrosis: unknown Has patient had a PCN reaction that required hospitalization : unknown Has patient had a PCN reaction occurring within the last 10 years: no If all of the above answers are "NO", then may proceed with Cephalosporin use.     Social History:   Social History   Socioeconomic History  . Marital status: Divorced    Spouse name: Not on file  . Number of  children: Not on file  . Years of education: Not on file  . Highest education level: Not on file  Occupational History  . Not on file  Social Needs  . Financial resource strain: Not on file  . Food insecurity:    Worry: Not on file    Inability: Not on file  . Transportation needs:    Medical: Not on file    Non-medical: Not on file  Tobacco Use  . Smoking status: Never Smoker  . Smokeless tobacco: Never Used  Substance and Sexual Activity  . Alcohol use: No  . Drug use: No  . Sexual activity: Not Currently  Lifestyle  . Physical activity:    Days per week: Not on file    Minutes per session: Not on file  . Stress: Not on file  Relationships  . Social connections:    Talks on phone: Not on file    Gets together: Not on file    Attends religious service: Not on file    Active member of club or organization: Not on file    Attends meetings of clubs or organizations: Not on file    Relationship status: Not on file  . Intimate partner violence:    Fear of current or ex  partner: Not on file    Emotionally abused: Not on file    Physically abused: Not on file    Forced sexual activity: Not on file  Other Topics Concern  . Not on file  Social History Narrative  . Not on file    Family History:   Family History  Problem Relation Age of Onset  . Sudden Cardiac Death Neg Hx    Family Status:  Family Status  Relation Name Status  . Mother  Deceased  . Father  Deceased  . Neg Hx  (Not Specified)    ROS:  Please see the history of present illness.  All other ROS reviewed and negative.     Physical Exam/Data:   Vitals:   01/04/18 1315 01/04/18 1330 01/04/18 1345 01/04/18 1400  BP: (!) 147/76 (!) 158/77 (!) 155/79 (!) 177/93  Pulse: 76 80 78 85  Resp: 13 10 11 19   Temp:      TempSrc:      SpO2: 99% 99% 99% 100%  Weight:      Height:       No intake or output data in the 24 hours ending 01/04/18 1449 Filed Weights   01/04/18 1125  Weight: 73 kg   Body mass index is 26.79 kg/m.   General: Well developed, well nourished, NAD Skin: Warm, dry, intact  Head: Normocephalic, atraumatic, clear, moist mucus membranes. Neck: Negative for carotid bruits. No JVD Lungs:Clear to ausculation bilaterally. No wheezes, rales, or rhonchi. Breathing is unlabored. Cardiovascular: RRR with S1 S2. No murmurs, rubs, gallops, or LV heave appreciated. Abdomen: Soft, non-tender, non-distended with normoactive bowel sounds. No obvious abdominal masses. MSK: Strength and tone appear normal for age. 5/5 in all extremities Extremities: Mild upper extremity edema. No clubbing or cyanosis. DP/PT pulses 1+ bilaterally Neuro: Alert and oriented. No focal deficits. No facial asymmetry. MAE spontaneously. Psych: Responds to questions appropriately with normal affect.     EKG:  The EKG was personally reviewed and demonstrates: 01/04/18 NSR with non-specific T wave abnormalities, similar to prior tracing  Telemetry:  Telemetry was personally reviewed and demonstrates:  01/04/18 NSR HR 70's   Relevant CV Studies:  Echo 12/26/17   Study Conclusions  -  Left ventricle: Apical images sub optimal due to breast implants. The cavity size was mildly dilated. Wall thickness was increased in a pattern of mild LVH. Systolic function was moderately to severely reduced. The estimated ejection fraction was in the range of 30% to 35%. Diffuse hypokinesis. Doppler parameters are consistent with both elevated ventricular end-diastolic filling pressure and elevated left atrial filling pressure. - Left atrium: The atrium was moderately dilated.  Cath 12/29/2017:   RPDA lesion is 75% stenosed.  Post Atrio lesion is 50% stenosed.  Prox LAD lesion is 90% stenosed.  Dist LAD lesion is 90% stenosed.  Ost 1st Diag to 1st Diag lesion is 90% stenosed.  Ost 1st Mrg to 1st Mrg lesion is 80% stenosed.  Prox LAD to Mid LAD lesion is 40% stenosed.  There is moderate to severe left ventricular systolic dysfunction.  LV end diastolic pressure is normal.  There is no mitral valve regurgitation.  There is no aortic valve stenosis.   1. Severe 3 vessel obstructive CAD. 2. Moderate to severe LV dysfunction. EF 30-35% 3. Normal LV filling pressures 4. Normal right heart pressures. 5. Normal cardiac output.   Plan: patient is diabetic with LV dysfunction and 3 vessel CAD. Recommend surgical consultation for revascularization. If she is not felt to be a surgical candidate we could treat the proximal LAD +/- distal LAD with PCI. The other vessels are not suitable PCI targets.    Cath 12/30/2017:  Prox LAD lesion is 90% stenosed.  A drug-eluting stent was successfully placed using a STENT SYNERGY DES 3X38.  Post intervention, there is 0% residual stenosis.  Dist LAD lesion is 90% stenosed.  A drug-eluting stent was successfully placed using a STENT SYNERGY DES 2.5X16.  Post intervention, there is a 0% residual stenosis.   1. Successful PCI of the  proximal and distal LAD with DES x 2.  Plan: since patient is ASA allergic I would recommend Brilinta monotherapy 90 mg bid for one year then 60 mg bid long term. Patient is a candidate for discharge from a cardiac standpoint tomorrow. Her residual CAD will be treated medically.   Laboratory Data:  Chemistry Recent Labs  Lab 12/30/17 0351 12/31/17 0417 01/04/18 1228  NA 140 138 140  K 4.0 3.7 4.3  CL 102 103 103  CO2 28 27 23   GLUCOSE 205* 177* 189*  BUN 17 11 23   CREATININE 1.00 0.96 1.05*  CALCIUM 8.3* 8.8* 9.0  GFRNONAA 59* >60 55*  GFRAA >60 >60 >60  ANIONGAP 10 8 14     Total Protein  Date Value Ref Range Status  06/02/2017 7.2 6.5 - 8.1 g/dL Final   Albumin  Date Value Ref Range Status  06/02/2017 3.2 (L) 3.5 - 5.0 g/dL Final   AST  Date Value Ref Range Status  06/02/2017 15 15 - 41 U/L Final   ALT  Date Value Ref Range Status  06/02/2017 27 14 - 54 U/L Final   Alkaline Phosphatase  Date Value Ref Range Status  06/02/2017 66 38 - 126 U/L Final   Total Bilirubin  Date Value Ref Range Status  06/02/2017 1.1 0.3 - 1.2 mg/dL Final   Hematology Recent Labs  Lab 12/30/17 0351 12/31/17 0417 01/04/18 1327  WBC 7.0 8.0 9.3  RBC 3.89 4.08 3.76*  HGB 11.5* 11.7* 10.9*  HCT 34.9* 35.8* 34.8*  MCV 89.7 87.7 92.6  MCH 29.6 28.7 29.0  MCHC 33.0 32.7 31.3  RDW 12.4 12.1 12.5  PLT 192 230 244  Cardiac EnzymesNo results for input(s): TROPONINI in the last 168 hours.  Recent Labs  Lab 01/04/18 1235  TROPIPOC 0.27*    BNP Recent Labs  Lab 01/04/18 1327  BNP 333.2*    DDimer No results for input(s): DDIMER in the last 168 hours. TSH:  Lab Results  Component Value Date   TSH 17.450 (H) 12/29/2017   Lipids:No results found for: CHOL, HDL, LDLCALC, LDLDIRECT, TRIG, CHOLHDL HgbA1c: Lab Results  Component Value Date   HGBA1C 7.7 (H) 12/31/2017    Radiology/Studies:  Dg Chest Port 1 View  Result Date: 01/04/2018 CLINICAL DATA:  Difficulty  swallowing and breathing after stent placement. EXAM: PORTABLE CHEST 1 VIEW COMPARISON:  12/26/2017. FINDINGS: The heart size and mediastinal contours are within normal limits. Both lungs are clear. The visualized skeletal structures are unremarkable. Compared with priors, improved aeration. IMPRESSION: No active disease. Electronically Signed   By: Staci Righter M.D.   On: 01/04/2018 12:51   Assessment and Plan:   1.  Chest pain with known CAD s/p recent PCI/DES x2 to LAD: -Per echocardiogram 12/26/2017 with LVEF of 30 to 35% with diffuse hypokinesis with subsequent cardiac cath on 12/29/2017 with severe three-vessel disease thought not to be a CABG candidate per cardiothoracic surgery. Therefore patient was taken back to cardiac Cath Lab 12/30/2016 for PCI intervention to the LAD with known allergy to ASA and with plans for 12 months of Brilinta 90 mg twice daily with subsequent continuation of low-dose Brilinta 60 mg twice daily until further notification by cardiology -EKG, with NSR, nonspecific T wave abnormalities and no acute ischemic changes -Troponin, i-STAT troponin elevated at 0.27>> continue to trend  -CTA with pending results to rule out PE -Heparin per pharmacy -Continue statin, carvedilol 6.25 mg. Will restart Brilinta 90 mg -Will add IV Lasix 40mg  daily for now and monitor renal function -Will get her re-established on meds and trend trop's, if they significantly elevate, will likely need re-look angiography for further evaluation. If tren dis flat or no increase can stop hep gtt thereafter. -Will place case management consult for medication assistance  2.  Chronic systolic HF/ischemic cardiomyopathy with known LVEF of 30 to 35%: -Per echocardiogram 12/26/2017 with LVEF of 30 to 35% with diffuse hypokinesis -BNP mildly elevated on presentation at 333 -CXR with no active cardiopulmonary disease -Continue carvedilol 6.25 mg, Entresto 24-26 mg, spironolactone 25 mg -Will add IV lasix  40mg  daily  -Daily weight and strict I&O  3.  HTN: -Elevated, 177/93, 155/79, 158/77 -Continue carvedilol, Entresto  4.  DM2: -Stable, 7.7 on 12/31/2017 -Managed with SQ Lantus -Will need SSI for glucose control while inpatient status  For questions or updates, please contact [place] Please consult [email or web address] for contact info under Cardiology/STEMI.   Lyndel Safe NP-C [facility] Pager: [phone] 01/04/2018 2:49 PM    Attending note:  Patient seen and examined.  I reviewed records and discussed the case with Ms. Glenford Peers NP.  Patient presents after recent hospital stay with newly documented cardiomyopathy and multivessel CAD.  She was turned down for surgical revascularization and ultimately underwent placement of DES to the proximal and distal LAD with otherwise plan for medical therapy.  LVEF 30 to 35% range.  She was discharged from the medicine service on December 11.  Based on discussion with her it seems that she did not get all of her cardiac medications including Brilinta until Saturday.  She indicates worsening shortness of breath which began before starting Brilinta and also intermittent chest  discomfort, although presently resolved.  On examination she appears comfortable, no active chest pain.  Systolic blood pressure 825R over 80s with heart rate in the 80s in sinus rhythm.  Lungs are clear without labored breathing.  Cardiac exam reveals RRR without gallop.  Lab work shows BUN 23, creatinine 1.0, BNP 333, troponin I 0.27 (no recent levels for comparison), hemoglobin 10.9.  ECG shows sinus rhythm with possible old anteroseptal infarct pattern and stable nonspecific ST segment abnormalities in comparison to most recent tracing.  Patient underwent attempted chest CTA although intravenous dose extravasated.  She did have atherosclerotic changes noted within the thoracic aorta and evidence of residual coronary atherosclerosis with stent in the LAD.  No  other obvious acute findings.  Patient being admitted to the hospitalist team for further evaluation.  Acute stent thrombosis is unlikely given stable ECG and without substantial increase in troponin I level so far.  Would recommend resuming medical therapy for CAD including Brilinta, she is on heparin and with further troponin I trend ordered.  Not clear that we will need to perform follow-up cardiac testing/angiography unless troponin I trend increases significantly.  Would also continue Lasix with conversion to IV for now.  Satira Sark, M.D., F.A.C.C.

## 2018-01-04 NOTE — ED Notes (Signed)
IV access attempted X2 without success. 

## 2018-01-04 NOTE — ED Notes (Addendum)
Per IV team, pt refused to be stuck again for IV for CT angio. MD notified.

## 2018-01-04 NOTE — ED Notes (Signed)
Per CT, RAC IV infiltrated while attempting to get CT abdomen.

## 2018-01-04 NOTE — ED Triage Notes (Signed)
Per [facility], pt endorses SOB and CP since being released from the hospital on Thursday. Pt was admitted for angioplasty and diagnosed with CHF while there. Pt chest pain is centralized, tight, and 5/10. Pt satting fine on RA per EMS, but reports she can't catch her breath without the O2. Per EMS pt has allergy to aspirin and NTG "makes her feel bad. Pt restricted on left side due to mastectomy with lymph node removal. Per pt had mastectomy on right, but did not take lymph nodes and it is ok to use.

## 2018-01-04 NOTE — ED Notes (Signed)
Heparin verified with Patent examiner

## 2018-01-04 NOTE — Progress Notes (Signed)
ANTICOAGULATION CONSULT NOTE - Initial Consult  Pharmacy Consult for heparin  Indication: chest pain/ACS  Allergies  Allergen Reactions  . Aspirin Nausea And Vomiting  . Maxzide [Hydrochlorothiazide W-Triamterene] Swelling    Fluid retention  . Metformin And Related Diarrhea  . Nsaids Nausea And Vomiting  . Other Other (See Comments)    All steroids produce psychosis per pt Artificial sweeteners produce nausea and upset stomach.  Gregary Cromer [Pravastatin] Other (See Comments)    unknown  . Stadol [Butorphanol] Other (See Comments)    Toradol, etc And related- hallucinations  . Toradol [Ketorolac Tromethamine] Other (See Comments)    hallucinations  . Vistaril [Hydroxyzine Hcl] Other (See Comments)    unknown  . Erythromycin Itching and Rash  . Morphine And Related Hives and Rash    Iv morphine.  . Penicillins Hives, Itching and Rash    Has patient had a PCN reaction causing immediate rash, facial/tongue/throat swelling, SOB or lightheadedness with hypotension: yes Has patient had a PCN reaction causing severe rash involving mucus membranes or skin necrosis: unknown Has patient had a PCN reaction that required hospitalization : unknown Has patient had a PCN reaction occurring within the last 10 years: no If all of the above answers are "NO", then may proceed with Cephalosporin use.     Patient Measurements: Height: 5\' 5"  (165.1 cm) Weight: 161 lb (73 kg) IBW/kg (Calculated) : 57 Heparin Dosing Weight: 71.8kg  Vital Signs: Temp: 98.4 F (36.9 C) (12/15 1125) Temp Source: Oral (12/15 1125) BP: 155/79 (12/15 1345) Pulse Rate: 78 (12/15 1345)  Labs: Recent Labs    01/04/18 1228 01/04/18 1327  HGB  --  10.9*  HCT  --  34.8*  PLT  --  244  LABPROT 12.4  --   INR 0.93  --   CREATININE 1.05*  --     Estimated Creatinine Clearance: 52.7 mL/min (A) (by C-G formula based on SCr of 1.05 mg/dL (H)).   Medical History: Past Medical History:  Diagnosis Date  .  Breast cancer ([place])    bilat mastectomy and LUE lymphadenectomy 2014, chemoRx 2014 - 15  . Diabetes mellitus without complication ([place])   . Esophageal reflux   . Hypertension   . Hypothyroid   . Migraine   . Seizure ([place])     Medications:  (Not in a hospital admission)   Assessment: 67YO female presenting with chest pain. Pharmacy consulted to start heparin for ACS.   Patient does not appear to be on any anticoagulants PTA. Patient is on ticagrelor. Allergies have not been verified this admission, however patient has received heparin and Lovenox in the past.     Patient's H&H low stable, PLTs WNL. No bleeding noted.    Goal of Therapy:  Heparin level 0.3-0.7 units/ml  Monitor platelets by anticoagulation protocol: Yes   Plan:  Give 4000 units bolus x 1 Start heparin infusion at 850 units/hr Check anti-Xa level in 8 hours and daily while on heparin Continue to monitor H&H and platelets  Azzie Roup D PGY1 Pharmacy Resident  Phone [phone] Please use AMION for clinical pharmacists numbers  01/04/2018      1:59 PM

## 2018-01-04 NOTE — H&P (Addendum)
History and Physical  Andrea [name] XBW:[id number] DOB: [birth year] DOA: 01/04/2018  Referring physician: Valarie Merino, MD PCP: Medicine, [facility]  Outpatient Specialists: Cardiology Patient coming from: Home & is able to ambulate   Chief Complaint: Chest pain shortness of breath  HPI: Andrea [name] is a 67 y.o. female with medical history significant for breast cancer status post bilateral mastectomy 2018 coronary artery disease, congestive heart failure who was discharged 4 days ago after cardiac catheterization on January 01, 2018, with intervention on the LAD 90% stenosis with drug-eluting stent successfully placed.  She stated she was never been quite well since discharge ,she continued to have chest pain and shortness of breath, her symptoms are worse with exertion also apparently she was not able to work obtained her medication for which she was discharged particularly her Brilinta.   ED Course: Was unremarkable.  She was started on heparin and she was given IV normal saline ED.consulted with cardiology home and GI recommendations as follows  Review of Systems:  Pt complains of chest pain and shortness of breath  Pt denies any fever chills abdominal pain diarrhea.  Review of systems are otherwise negative   Past Medical History:  Diagnosis Date  . Breast cancer ([place])    bilat mastectomy and LUE lymphadenectomy 2014, chemoRx 2014 - 15  . Diabetes mellitus without complication ([place])   . Esophageal reflux   . Hypertension   . Hypothyroid   . Migraine   . Seizure [facility])    Past Surgical History:  Procedure Laterality Date  . ABDOMINAL HYSTERECTOMY     1984 , done for ruptured cyst and endometriosis  . APPENDECTOMY    . CHOLECYSTECTOMY     1980's  . CORONARY STENT INTERVENTION N/A 12/30/2017   Procedure: CORONARY STENT INTERVENTION;  Surgeon: Martinique, Peter M, MD;  Location: [place] [facility];  Service: Cardiovascular;  Laterality: N/A;    . EYE SURGERY  08/28/2015   Right eye  . KNEE SURGERY Left   . MASTECTOMY     bilateral  . Open surgery for bowel obstruction, 2000's    . RIGHT/LEFT HEART CATH AND CORONARY ANGIOGRAPHY N/A 12/29/2017   Procedure: RIGHT/LEFT HEART CATH AND CORONARY ANGIOGRAPHY;  Surgeon: Martinique, Peter M, MD;  Location: [place] [facility];  Service: Cardiovascular;  Laterality: N/A;    Social History:  reports that she has never smoked. She has never used smokeless tobacco. She reports that she does not drink alcohol or use drugs.   Allergies  Allergen Reactions  . Aspirin Nausea And Vomiting  . Maxzide [Hydrochlorothiazide W-Triamterene] Swelling    Fluid retention  . Metformin And Related Diarrhea  . Nsaids Nausea And Vomiting  . Other Other (See Comments)    All steroids produce psychosis per pt Artificial sweeteners produce nausea and upset stomach.  Gregary Cromer [Pravastatin] Other (See Comments)    unknown  . Stadol [Butorphanol] Other (See Comments)    Toradol, etc And related- hallucinations  . Toradol [Ketorolac Tromethamine] Other (See Comments)    hallucinations  . Vistaril [Hydroxyzine Hcl] Other (See Comments)    unknown  . Erythromycin Itching and Rash  . Morphine And Related Hives and Rash    Iv morphine.  . Penicillins Hives, Itching and Rash    Has patient had a PCN reaction causing immediate rash, facial/tongue/throat swelling, SOB or lightheadedness with hypotension: yes Has patient had a PCN reaction causing severe rash involving mucus membranes or skin necrosis: unknown Has patient  had a PCN reaction that required hospitalization : unknown Has patient had a PCN reaction occurring within the last 10 years: no If all of the above answers are "NO", then may proceed with Cephalosporin use.     Family History  Problem Relation Age of Onset  . Sudden Cardiac Death Neg Hx       Prior to Admission medications   Medication Sig Start Date End Date Taking? Authorizing  Provider  ALPRAZolam Duanne Moron) 0.5 MG tablet Take 0.5 mg by mouth 4 (four) times daily as needed for anxiety.  11/21/16  Yes [provider]  atorvastatin (LIPITOR) 80 MG tablet Take 1 tablet (80 mg total) by mouth at bedtime. 01/02/18  Yes Barrett, Evelene Croon, PA-C  carvedilol (COREG) 6.25 MG tablet Take 1 tablet (6.25 mg total) by mouth 2 (two) times daily with a meal. 01/02/18  Yes Barrett, Evelene Croon, PA-C  divalproex (DEPAKOTE) 250 MG DR tablet Take 1,000 mg by mouth at bedtime.    Yes [provider]  ELDERBERRY PO Take 1 Dose by mouth daily.   Yes [provider]  furosemide (LASIX) 20 MG tablet Take 1 tablet (20 mg total) by mouth daily. Patient taking differently: Take 10 mg by mouth daily.  01/02/18  Yes Barrett, Evelene Croon, PA-C  LANTUS SOLOSTAR 100 UNIT/ML Solostar Pen Inject 20-50 Units into the skin daily. Take based on blood sugar 03/20/17  Yes [provider]  levothyroxine (SYNTHROID, LEVOTHROID) 200 MCG tablet Take 1 tablet (200 mcg total) by mouth at bedtime. Patient taking differently: Take 200 mcg by mouth daily before breakfast.  01/12/17  Yes Lavina Hamman, MD  [facility] ER 30 MG T12A Take 1 tablet by mouth every 12 (twelve) hours. 12/15/17  Yes [provider]  oxyCODONE-acetaminophen (PERCOCET) 10-325 MG tablet Take 1 tablet by mouth 3 (three) times daily as needed for pain. 02/03/17  Yes [provider]  pregabalin (LYRICA) 75 MG capsule Take 1 capsule (75 mg total) 2 (two) times daily by mouth. 11/24/16  Yes Osei-Bonsu, Iona Beard, MD  promethazine (PHENERGAN) 25 MG tablet Take 25 mg by mouth every 6 (six) hours as needed for nausea.  05/30/17  Yes [provider]  sacubitril-valsartan (ENTRESTO) 24-26 MG Take 1 tablet by mouth 2 (two) times daily. 01/02/18  Yes Barrett, Evelene Croon, PA-C  spironolactone (ALDACTONE) 25 MG tablet Take 0.5 tablets (12.5 mg total) by mouth daily. 01/02/18  Yes Barrett, Evelene Croon, PA-C  ticagrelor  (BRILINTA) 90 MG TABS tablet Take 1 tablet (90 mg total) by mouth 2 (two) times daily. 01/02/18  Yes Barrett, Evelene Croon, PA-C    Physical Exam: BP 129/85 (BP Location: Right Leg)   Pulse 80   Temp 98.4 F (36.9 C) (Oral)   Resp 17   Ht 5\' 5"  (1.651 m)   Wt 73 kg   SpO2 100%   BMI 26.79 kg/m   Exam:  . General: 67 y.o. year-old female well developed well nourished in no acute distress.  Alert and oriented x3. . Cardiovascular: Regular rate and rhythm with no rubs or gallops.  No thyromegaly or JVD noted. . Chest: Bilateral mastectomy with prosthetics.  The left breast prosthetic is smaller than the right . Respiratory: Clear to auscultation with no wheezes or rales. Good inspiratory effort. . Abdomen: Soft nontender nondistended with normal bowel sounds x4 quadrants. . Musculoskeletal: No lower extremity edema. 2/4 pulses in all 4 extremities. . Skin: No ulcerative lesions noted or rashes, . Psychiatry: Mood  is appropriate for condition and setting           Labs on Admission:  Basic Metabolic Panel: Recent Labs  Lab 12/29/17 0437 12/29/17 0933 12/30/17 0351 12/31/17 0417 01/04/18 1228  NA 141  --  140 138 140  K 4.2  --  4.0 3.7 4.3  CL 101  --  102 103 103  CO2 32  --  28 27 23   GLUCOSE 141*  --  205* 177* 189*  BUN 27*  --  17 11 23   CREATININE 1.15*  --  1.00 0.96 1.05*  CALCIUM 8.3*  --  8.3* 8.8* 9.0  MG  --  1.8  --   --   --    Liver Function Tests: No results for input(s): AST, ALT, ALKPHOS, BILITOT, PROT, ALBUMIN in the last 168 hours. No results for input(s): LIPASE, AMYLASE in the last 168 hours. No results for input(s): AMMONIA in the last 168 hours. CBC: Recent Labs  Lab 12/30/17 0351 12/31/17 0417 01/04/18 1327  WBC 7.0 8.0 9.3  NEUTROABS  --   --  5.3  HGB 11.5* 11.7* 10.9*  HCT 34.9* 35.8* 34.8*  MCV 89.7 87.7 92.6  PLT 192 230 244   Cardiac Enzymes: No results for input(s): CKTOTAL, CKMB, CKMBINDEX, TROPONINI in the last 168  hours.  BNP (last 3 results) Recent Labs    12/26/17 0504 01/04/18 1327  BNP 714.4* 333.2*    ProBNP (last 3 results) No results for input(s): PROBNP in the last 8760 hours.  CBG: Recent Labs  Lab 12/30/17 0739 12/30/17 1205 12/30/17 1640 12/30/17 2154 12/31/17 0644  GLUCAP 222* 141* 152* 156* 171*    Radiological Exams on Admission: Ct Angio Chest Pe W And/or Wo Contrast  Result Date: 01/04/2018 CLINICAL DATA:  Shortness of breath and chest pain. Unfortunately, the patient's entire 75 mL dose of Isovue 370 extravasated into the right upper extremity. The injection was via an antecubital IV. I examined the patient and she have good pulses in her wrist and capillary refill in her fingers. The extravasation could be palpated above the level of the elbow. Orders for management of the extravasation are being placed by the CT technologist. I also discussed the need for intermittent icing and elevation with the patient. I also explained the need to notify somebody immediately if she develops pain or coldness in her upper extremity distal to the antecubital fossa. The referring clinical team should also be notified immediately of any skin changes. EXAM: CT ANGIOGRAPHY CHEST WITH CONTRAST TECHNIQUE: Multidetector CT imaging of the chest was performed using the standard protocol during bolus administration of intravenous contrast. Multiplanar CT image reconstructions and MIPs were obtained to evaluate the vascular anatomy. CONTRAST:  25mL ISOVUE-370 IOPAMIDOL (ISOVUE-370) INJECTION 76% COMPARISON:  November 30, 2015 FINDINGS: Cardiovascular: Atherosclerotic changes are seen in the nonaneurysmal thoracic aorta. The lack of contrast precludes evaluation for dissection. The central pulmonary arteries are normal in caliber. Pulmonary emboli could not be evaluated due to lack of contrast. There appears to be a coronary stent in the LAD. Coronary artery calcifications are seen in the right coronary  artery. The heart size is normal. Mediastinum/Nodes: Bilateral breast implants. No adenopathy. No effusions. No other mediastinal abnormalities. Lungs/Pleura: Central airways are normal. No pneumothorax. No pulmonary nodules, masses, or focal infiltrates. Upper Abdomen: Previous cholecystectomy. No other abnormalities in the upper abdomen. Musculoskeletal: No change in the spine. Midthoracic vertebral body mild compression fracture is stable. Review of the MIP images  confirms the above findings. IMPRESSION: 1. Unfortunately, the intravenous dose of Isovue 370 extravasated into the patient's right upper extremity as described above. The patient was examined and was experiencing no immediate distress. Extravasation orders are being placed by the CT technologist. The patient was also given instructions verbally. 2. Pulmonary emboli could not be assessed without contrast. 3. Atherosclerotic changes in the nonaneurysmal thoracic aorta. Coronary artery stent in the LAD. Atherosclerotic change in the right coronary artery. 4. Bilateral breast implants. 5. No other significant abnormalities. Aortic Atherosclerosis (ICD10-I70.0). Electronically Signed   By: Dorise Bullion III M.D   On: 01/04/2018 15:55   Dg Chest Port 1 View  Result Date: 01/04/2018 CLINICAL DATA:  Difficulty swallowing and breathing after stent placement. EXAM: PORTABLE CHEST 1 VIEW COMPARISON:  12/26/2017. FINDINGS: The heart size and mediastinal contours are within normal limits. Both lungs are clear. The visualized skeletal structures are unremarkable. Compared with priors, improved aeration. IMPRESSION: No active disease. Electronically Signed   By: Staci Righter M.D.   On: 01/04/2018 12:51    EKG: Independently reviewed.  No STEMI and no change from previous EKG  Assessment/Plan Present on Admission: . Hypertension . Breast cancer ([place]) . Ischemic chest pain ([place]) . Chest pain  Principal Problem:   Ischemic chest pain ([facility]) Active  Problems:   Hypertension   Breast cancer ([place])   Diabetes mellitus without complication ([facility])   Acute CHF (congestive heart failure) ([facility])   Chest pain  1.  Chest pain with known coronary artery disease status post cardiac catheterization with repeat vascularization and placement of drug-eluting stent on December 30, 2017.  EKG negative no change troponin is slightly up with level of 0.27.  We will continue to trend that down cardiology was consulted he recommended to follow creatinine also to order CTA which is still pending patient went to CT for CTA but her IV infiltrated and need to be done at a later time he added Lasix 40 mg daily commended case management consult for medication assistance particularly for her Brilinta.  2.Hypertension uncontrolled blood pressure is elevated she will continue her anastrozole and carvedilol  3.  Type 2 diabetes mellitus uncontrolled we will continue her Lantus and sliding scale  4.  Chronic systolic heart failure with ischemic cardiomyopathy x-ray does not show any active cardiopulmonary disease we will continue carvedilol 6.25 and [place] as well as spironolactone cardiology has added IV Lasix 40 mg daily  Severity of Illness: The appropriate patient status for this patient is OBSERVATION. Observation status is judged to be reasonable and necessary in order to provide the required intensity of service to ensure the patient's safety. The patient's presenting symptoms, physical exam findings, and initial radiographic and laboratory data in the context of their medical condition is felt to place them at decreased risk for further clinical deterioration. Furthermore, it is anticipated that the patient will be medically stable for discharge from the hospital within 2 midnights of admission. The following factors support the patient status of observation.   " The patient's presenting symptoms include chest pain and shortness of breath. " The physical exam  findings include chest pain. " The initial radiographic and laboratory data are EKG chest x-ray.     DVT prophylaxis: Heparin  Code Status: Full  Family Communication: None at bedside  Disposition Plan: Home  Consults called: Cardiology  Admission status: Observation    Cristal Deer MD [facility] Pager [phone]  If 7PM-7AM, please contact night-coverage [email or web address] Password [email or web address]  01/04/2018,  6:32 PM

## 2018-01-04 NOTE — ED Notes (Signed)
Ice applied to right upper extremity. Blood pressure cuff placed on right leg.

## 2018-01-04 NOTE — Progress Notes (Addendum)
ANTICOAGULATION CONSULT NOTE - Follow Up Consult  Pharmacy Consult for heparin Indication: chest pain/ACS  Labs: Recent Labs    01/04/18 1228 01/04/18 1327 01/04/18 2201  HGB  --  10.9*  --   HCT  --  34.8*  --   PLT  --  244  --   LABPROT 12.4  --   --   INR 0.93  --   --   HEPARINUNFRC  --   --  0.12*  CREATININE 1.05*  --   --     Assessment: 67yo female subtherapeutic on heparin with initial dosing for CP; no gtt issues or signs of bleeding per RN.  Goal of Therapy:  Heparin level 0.3-0.7 units/ml   Plan:  Will rebolus with heparin 2000 units increase heparin gtt by 3-4 units/kg/hr to 1100 units/hr and check level in 6 hours.    Wynona Neat, PharmD, BCPS  01/04/2018,11:22 PM   Addendum: Heparin level now therapeutic (0.68). Will continue gtt at current rate and confirm stable with additional level. VB 01/05/2018 7:26 AM

## 2018-01-04 NOTE — Progress Notes (Signed)
PHARMACIST - PHYSICIAN ORDER COMMUNICATION  CONCERNING: P&T Medication Policy on Herbal Medications  DESCRIPTION:  This patient's order for Kendall Flack has been noted.  This product(s) is classified as an "herbal" or natural product. Due to a lack of definitive safety studies or FDA approval, nonstandard manufacturing practices, plus the potential risk of unknown drug-drug interactions while on inpatient medications, the Pharmacy and Therapeutics Committee does not permit the use of "herbal" or natural products of this type within [facility].   ACTION TAKEN: The pharmacy department is unable to verify this order at this time and your patient has been informed of this safety policy. Please reevaluate patient's clinical condition at discharge and address if the herbal or natural product(s) should be resumed at that time.  Mila Merry Gerarda Fraction, PharmD, [place] PGY2 Infectious Diseases Pharmacy Resident Phone: [phone]

## 2018-01-04 NOTE — ED Notes (Signed)
Patient transported to CT 

## 2018-01-04 NOTE — Progress Notes (Signed)
Patient educated on patient valuables. $110.00 cash and one credit card logged with security. Documentation in the paper chart.

## 2018-01-04 NOTE — ED Notes (Signed)
Xray at the bedside.

## 2018-01-04 NOTE — ED Provider Notes (Signed)
[place] [facility] EMERGENCY DEPARTMENT Provider Note   CSN: [id number] Arrival date & time: 01/04/18  1120     History   Chief Complaint Chief Complaint  Patient presents with  . Chest Pain  . Shortness of Breath    HPI Andrea [name] is a 67 y.o. female.  67 year old female with prior medical history as detailed below presents for evaluation of shortness of breath and chest discomfort.  Patient reports recent admission for congestive heart failure.  She reports receiving 2 cardiac catheterizations with intervention on the LAD.  She was discharged 4 days prior.  She reports that since her discharge she has had continued shortness of breath and vague substernal chest discomfort.  The symptoms have been constant for the last 4 days.  She denies associated fever.  She appears comfortable at rest.  Symptoms are worse with exertion.  The history is provided by the patient and medical records.  Chest Pain   This is a new problem. The current episode started more than 2 days ago. The problem occurs constantly. The problem has not changed since onset.The pain is associated with exertion. The pain is present in the substernal region. The pain is mild. The quality of the pain is described as exertional. The pain does not radiate. Associated symptoms include shortness of breath. Pertinent negatives include no fever.  Shortness of Breath  Associated symptoms include chest pain. Pertinent negatives include no fever.    Past Medical History:  Diagnosis Date  . Breast cancer ([place])    bilat mastectomy and LUE lymphadenectomy 2014, chemoRx 2014 - 15  . Diabetes mellitus without complication ([place])   . Esophageal reflux   . Hypertension   . Hypothyroid   . Migraine   . Seizure [facility])     Patient Active Problem List   Diagnosis Date Noted  . CAD (coronary artery disease) 12/30/2017  . Acute CHF (congestive heart failure) ([place]) 12/26/2017  . Acute systolic CHF (congestive  heart failure) ([place]) 12/26/2017  . Unspecified abdominal pain 06/02/2017  . Hyperglycemia 06/01/2017  . C. difficile colitis 03/17/2017  . AKI (acute kidney injury) ([place])   . Diverticulitis 02/17/2017  . Frequent falls 01/08/2017  . Rhabdomyolysis 01/08/2017  . Chronic pain 01/08/2017  . Intractable nausea and vomiting 11/23/2016  . Diarrhea 11/23/2016  . UTI (urinary tract infection) 12/03/2013  . Fall 12/02/2013  . Hypertension   . Breast cancer ([place])   . Diabetes mellitus without complication ([place])   . Migraine   . Hypothyroid   . Epilepsy ([place])   . Esophageal reflux   . Multiple rib fractures 11/28/2013    Past Surgical History:  Procedure Laterality Date  . ABDOMINAL HYSTERECTOMY     1984 , done for ruptured cyst and endometriosis  . APPENDECTOMY    . CHOLECYSTECTOMY     1980's  . CORONARY STENT INTERVENTION N/A 12/30/2017   Procedure: CORONARY STENT INTERVENTION;  Surgeon: Martinique,  M, MD;  Location: [facility] CV LAB;  Service: Cardiovascular;  Laterality: N/A;  . EYE SURGERY  08/28/2015   Right eye  . KNEE SURGERY Left   . MASTECTOMY     bilateral  . Open surgery for bowel obstruction, 2000's    . RIGHT/LEFT HEART CATH AND CORONARY ANGIOGRAPHY N/A 12/29/2017   Procedure: RIGHT/LEFT HEART CATH AND CORONARY ANGIOGRAPHY;  Surgeon: Martinique,  M, MD;  Location: [facility];  Service: Cardiovascular;  Laterality: N/A;     OB History   No obstetric  history on file.      Home Medications    Prior to Admission medications   Medication Sig Start Date End Date Taking? Authorizing Provider  ALPRAZolam Duanne Moron) 0.5 MG tablet Take 0.5 mg by mouth 4 (four) times daily as needed for anxiety.  11/21/16  Yes [provider]  atorvastatin (LIPITOR) 80 MG tablet Take 1 tablet (80 mg total) by mouth at bedtime. 01/02/18  Yes Barrett, Evelene Croon, PA-C  carvedilol (COREG) 6.25 MG tablet Take 1 tablet (6.25 mg total) by mouth 2 (two) times daily with a meal.  01/02/18  Yes Barrett, Evelene Croon, PA-C  furosemide (LASIX) 20 MG tablet Take 1 tablet (20 mg total) by mouth daily. Patient taking differently: Take 10 mg by mouth daily.  01/02/18  Yes Barrett, Evelene Croon, PA-C  LANTUS SOLOSTAR 100 UNIT/ML Solostar Pen Inject 20-50 Units into the skin daily.  03/20/17  Yes [provider]  levothyroxine (SYNTHROID, LEVOTHROID) 200 MCG tablet Take 1 tablet (200 mcg total) by mouth at bedtime. Patient taking differently: Take 200 mcg by mouth daily before breakfast.  01/12/17  Yes Lavina Hamman, MD  [facility] ER 30 MG T12A Take 1 tablet by mouth every 12 (twelve) hours. 12/15/17  Yes [provider]  oxyCODONE-acetaminophen (PERCOCET) 10-325 MG tablet Take 1 tablet by mouth 3 (three) times daily as needed for pain. 02/03/17  Yes [provider]  pregabalin (LYRICA) 75 MG capsule Take 1 capsule (75 mg total) 2 (two) times daily by mouth. 11/24/16  Yes Osei-Bonsu, Iona Beard, MD  promethazine (PHENERGAN) 25 MG tablet Take 25 mg by mouth every 6 (six) hours as needed for nausea.  05/30/17  Yes [provider]  sacubitril-valsartan (ENTRESTO) 24-26 MG Take 1 tablet by mouth 2 (two) times daily. 01/02/18  Yes Barrett, Evelene Croon, PA-C  spironolactone (ALDACTONE) 25 MG tablet Take 0.5 tablets (12.5 mg total) by mouth daily. 01/02/18  Yes Barrett, Evelene Croon, PA-C  ticagrelor (BRILINTA) 90 MG TABS tablet Take 1 tablet (90 mg total) by mouth 2 (two) times daily. 01/02/18  Yes Barrett, Evelene Croon, PA-C    Family History Family History  Problem Relation Age of Onset  . Sudden Cardiac Death Neg Hx     Social History Social History   Tobacco Use  . Smoking status: Never Smoker  . Smokeless tobacco: Never Used  Substance Use Topics  . Alcohol use: No  . Drug use: No     Allergies   Aspirin; Maxzide [hydrochlorothiazide w-triamterene]; Metformin and related; Nsaids; Other; Pravachol [pravastatin]; Stadol [butorphanol]; Toradol [ketorolac  tromethamine]; Vistaril [hydroxyzine hcl]; Erythromycin; Morphine and related; and Penicillins   Review of Systems Review of Systems  Constitutional: Negative for fever.  Respiratory: Positive for shortness of breath.   Cardiovascular: Positive for chest pain.  All other systems reviewed and are negative.    Physical Exam Updated Vital Signs BP (!) 177/93   Pulse 85   Temp 98.4 F (36.9 C) (Oral)   Resp 19   Ht 5\' 5"  (1.651 m)   Wt 73 kg   SpO2 100%   BMI 26.79 kg/m   Physical Exam Vitals signs and nursing note reviewed.  Constitutional:      General: She is not in acute distress.    Appearance: She is well-developed.  HENT:     Head: Normocephalic and atraumatic.  Eyes:     Conjunctiva/sclera: Conjunctivae normal.     Pupils: Pupils are equal, round, and reactive to light.  Neck:  Musculoskeletal: Normal range of motion and neck supple.  Cardiovascular:     Rate and Rhythm: Normal rate and regular rhythm.     Heart sounds: Normal heart sounds.  Pulmonary:     Effort: Pulmonary effort is normal. No respiratory distress.     Breath sounds: Normal breath sounds.  Abdominal:     General: There is no distension.     Palpations: Abdomen is soft.     Tenderness: There is no abdominal tenderness.  Musculoskeletal: Normal range of motion.        General: No deformity.  Skin:    General: Skin is warm and dry.  Neurological:     Mental Status: She is alert and oriented to person, place, and time.      ED Treatments / Results  Labs (all labs ordered are listed, but only abnormal results are displayed) Labs Reviewed  BASIC METABOLIC PANEL - Abnormal; Notable for the following components:      Result Value   Glucose, Bld 189 (*)    Creatinine, Ser 1.05 (*)    GFR calc non Af Amer 55 (*)    All other components within normal limits  BRAIN NATRIURETIC PEPTIDE - Abnormal; Notable for the following components:   B Natriuretic Peptide 333.2 (*)    All other  components within normal limits  CBC WITH DIFFERENTIAL/PLATELET - Abnormal; Notable for the following components:   RBC 3.76 (*)    Hemoglobin 10.9 (*)    HCT 34.8 (*)    All other components within normal limits  I-STAT TROPONIN, ED - Abnormal; Notable for the following components:   Troponin i, poc 0.27 (*)    All other components within normal limits  PROTIME-INR  CBC WITH DIFFERENTIAL/PLATELET  HEPARIN LEVEL (UNFRACTIONATED)    EKG EKG Interpretation  Date/Time:  Sunday January 04 2018 11:23:02 EST Ventricular Rate:  84 PR Interval:    QRS Duration: 82 QT Interval:  386 QTC Calculation: 457 R Axis:   30 Text Interpretation:  Sinus rhythm Anteroseptal infarct, old Borderline repolarization abnormality Confirmed by Dene Gentry [phone]) on 01/04/2018 11:26:55 AM   Radiology Dg Chest Port 1 View  Result Date: 01/04/2018 CLINICAL DATA:  Difficulty swallowing and breathing after stent placement. EXAM: PORTABLE CHEST 1 VIEW COMPARISON:  12/26/2017. FINDINGS: The heart size and mediastinal contours are within normal limits. Both lungs are clear. The visualized skeletal structures are unremarkable. Compared with priors, improved aeration. IMPRESSION: No active disease. Electronically Signed   By: Staci Righter M.D.   On: 01/04/2018 12:51    Procedures Procedures (including critical care time)  Medications Ordered in ED Medications  heparin injection 4,000 Units (has no administration in time range)  heparin ADULT infusion 100 units/mL (25000 units/221mL sodium chloride 0.45%) (has no administration in time range)     Initial Impression / Assessment and Plan / ED Course  I have reviewed the triage vital signs and the nursing notes.  Pertinent labs & imaging results that were available during my care of the patient were reviewed by me and considered in my medical decision making (see chart for details).     MDM  Screen complete  Patient is presenting for evaluation  of persistent shortness of breath and chest discomfort.  This is been ongoing since the patient's recent admission and work-up for CHF and CAD.  Initial EKG is without significant acute change.  Initial troponin is mildly elevated at 0.27.  Chest x-ray is clear.  Baseline labs are otherwise  without significant abnormality.  Case is discussed with cardiology.  They will consult.  They agree that heparinization would be appropriate.  Hospitalist service is aware of case.  They are aware of plan heparinization.  CTA of chest to rule out PE is still pending at this time.  Final Clinical Impressions(s) / ED Diagnoses   Final diagnoses:  Dyspnea, unspecified type    ED Discharge Orders    None       Valarie Merino, MD 01/04/18 1452

## 2018-01-05 ENCOUNTER — Telehealth (HOSPITAL_COMMUNITY): Payer: Self-pay

## 2018-01-05 DIAGNOSIS — I209 Angina pectoris, unspecified: Secondary | ICD-10-CM | POA: Diagnosis not present

## 2018-01-05 DIAGNOSIS — I5022 Chronic systolic (congestive) heart failure: Secondary | ICD-10-CM

## 2018-01-05 DIAGNOSIS — I10 Essential (primary) hypertension: Secondary | ICD-10-CM

## 2018-01-05 LAB — GLUCOSE, CAPILLARY
GLUCOSE-CAPILLARY: 200 mg/dL — AB (ref 70–99)
Glucose-Capillary: 125 mg/dL — ABNORMAL HIGH (ref 70–99)
Glucose-Capillary: 146 mg/dL — ABNORMAL HIGH (ref 70–99)

## 2018-01-05 LAB — BASIC METABOLIC PANEL
Anion gap: 10 (ref 5–15)
BUN: 19 mg/dL (ref 8–23)
CO2: 26 mmol/L (ref 22–32)
Calcium: 8.8 mg/dL — ABNORMAL LOW (ref 8.9–10.3)
Chloride: 104 mmol/L (ref 98–111)
Creatinine, Ser: 1.14 mg/dL — ABNORMAL HIGH (ref 0.44–1.00)
GFR calc Af Amer: 58 mL/min — ABNORMAL LOW (ref >60)
GFR calc non Af Amer: 50 mL/min — ABNORMAL LOW (ref >60)
Glucose, Bld: 167 mg/dL — ABNORMAL HIGH (ref 70–99)
Potassium: 4 mmol/L (ref 3.5–5.1)
Sodium: 140 mmol/L (ref 135–145)

## 2018-01-05 LAB — CBC
HCT: 31.8 % — ABNORMAL LOW (ref 36.0–46.0)
Hemoglobin: 10.2 g/dL — ABNORMAL LOW (ref 12.0–15.0)
MCH: 28.9 pg (ref 26.0–34.0)
MCHC: 32.1 g/dL (ref 30.0–36.0)
MCV: 90.1 fL (ref 80.0–100.0)
Platelets: 213 10*3/uL (ref 150–400)
RBC: 3.53 MIL/uL — ABNORMAL LOW (ref 3.87–5.11)
RDW: 12.5 % (ref 11.5–15.5)
WBC: 7.5 10*3/uL (ref 4.0–10.5)
nRBC: 0 % (ref 0.0–0.2)

## 2018-01-05 LAB — TROPONIN I: Troponin I: 0.13 ng/mL (ref <0.03)

## 2018-01-05 LAB — TSH: TSH: 11.368 u[IU]/mL — ABNORMAL HIGH (ref 0.350–4.500)

## 2018-01-05 LAB — HEPARIN LEVEL (UNFRACTIONATED): Heparin Unfractionated: 0.68 IU/mL (ref 0.30–0.70)

## 2018-01-05 NOTE — Progress Notes (Signed)
Progress Note  Patient Name: Andrea [name] Date of Encounter: 01/05/2018  Primary Cardiologist: Kirk Ruths, MD   Subjective   Denies chest pain at rest or with light activity.  Does not have shortness of breath.  Sore at phlebotomy site.  Inpatient Medications    Scheduled Meds: . atorvastatin  80 mg Oral QHS  . carvedilol  6.25 mg Oral BID WC  . divalproex  1,000 mg Oral QHS  . furosemide  40 mg Intravenous BID  . insulin aspart  0-15 Units Subcutaneous TID WC  . insulin glargine  20 Units Subcutaneous Daily  . levothyroxine  200 mcg Oral QAC breakfast  . pregabalin  75 mg Oral BID  . sacubitril-valsartan  1 tablet Oral BID  . sodium chloride flush  3 mL Intravenous Q12H  . spironolactone  12.5 mg Oral Daily  . ticagrelor  90 mg Oral BID   Continuous Infusions: . sodium chloride 250 mL (01/04/18 2158)  . heparin 1,100 Units/hr (01/05/18 0948)   PRN Meds: sodium chloride, ALPRAZolam, oxyCODONE-acetaminophen **AND** oxyCODONE, promethazine, sodium chloride flush   Vital Signs    Vitals:   01/04/18 1745 01/04/18 1845 01/04/18 1949 01/05/18 0403  BP: (!) 124/56 110/67 (!) 142/74 115/81  Pulse: 80 80 84 77  Resp: 17 (!) 21 18 17   Temp:   98.6 F (37 C) 98.4 F (36.9 C)  TempSrc:   Oral Oral  SpO2: 100% 98% 95% 95%  Weight:      Height:        Intake/Output Summary (Last 24 hours) at 01/05/2018 1125 Last data filed at 01/05/2018 0900 Gross per 24 hour  Intake 574.87 ml  Output 350 ml  Net 224.87 ml   Filed Weights   01/04/18 1125  Weight: 73 kg    Telemetry    NSR - Personally Reviewed  ECG    NSR old anteroseptal MI, no acute repolarization abnormalities.- Personally Reviewed  Physical Exam  Comfortable lying flat GEN: No acute distress.   Neck: No JVD Cardiac: RRR, no murmurs, rubs, or gallops.  Respiratory: Clear to auscultation bilaterally. GI: Soft, nontender, non-distended  MS: No edema; No deformity. Neuro:  Nonfocal    Psych: Normal affect   Labs    Chemistry Recent Labs  Lab 12/31/17 0417 01/04/18 1228 01/05/18 0611  NA 138 140 140  K 3.7 4.3 4.0  CL 103 103 104  CO2 27 23 26   GLUCOSE 177* 189* 167*  BUN 11 23 19   CREATININE 0.96 1.05* 1.14*  CALCIUM 8.8* 9.0 8.8*  GFRNONAA >60 55* 50*  GFRAA >60 >60 58*  ANIONGAP 8 14 10      Hematology Recent Labs  Lab 12/31/17 0417 01/04/18 1327 01/05/18 0611  WBC 8.0 9.3 7.5  RBC 4.08 3.76* 3.53*  HGB 11.7* 10.9* 10.2*  HCT 35.8* 34.8* 31.8*  MCV 87.7 92.6 90.1  MCH 28.7 29.0 28.9  MCHC 32.7 31.3 32.1  RDW 12.1 12.5 12.5  PLT 230 244 213    Cardiac EnzymesNo results for input(s): TROPONINI in the last 168 hours.  Recent Labs  Lab 01/04/18 1235  TROPIPOC 0.27*     BNP Recent Labs  Lab 01/04/18 1327  BNP 333.2*     DDimer No results for input(s): DDIMER in the last 168 hours.   Radiology    Ct Angio Chest Pe W And/or Wo Contrast  Result Date: 01/04/2018 CLINICAL DATA:  Shortness of breath and chest pain. Unfortunately, the patient's entire 75 mL dose of  Isovue 370 extravasated into the right upper extremity. The injection was via an antecubital IV. I examined the patient and she have good pulses in her wrist and capillary refill in her fingers. The extravasation could be palpated above the level of the elbow. Orders for management of the extravasation are being placed by the CT technologist. I also discussed the need for intermittent icing and elevation with the patient. I also explained the need to notify somebody immediately if she develops pain or coldness in her upper extremity distal to the antecubital fossa. The referring clinical team should also be notified immediately of any skin changes. EXAM: CT ANGIOGRAPHY CHEST WITH CONTRAST TECHNIQUE: Multidetector CT imaging of the chest was performed using the standard protocol during bolus administration of intravenous contrast. Multiplanar CT image reconstructions and MIPs were  obtained to evaluate the vascular anatomy. CONTRAST:  22mL ISOVUE-370 IOPAMIDOL (ISOVUE-370) INJECTION 76% COMPARISON:  November 30, 2015 FINDINGS: Cardiovascular: Atherosclerotic changes are seen in the nonaneurysmal thoracic aorta. The lack of contrast precludes evaluation for dissection. The central pulmonary arteries are normal in caliber. Pulmonary emboli could not be evaluated due to lack of contrast. There appears to be a coronary stent in the LAD. Coronary artery calcifications are seen in the right coronary artery. The heart size is normal. Mediastinum/Nodes: Bilateral breast implants. No adenopathy. No effusions. No other mediastinal abnormalities. Lungs/Pleura: Central airways are normal. No pneumothorax. No pulmonary nodules, masses, or focal infiltrates. Upper Abdomen: Previous cholecystectomy. No other abnormalities in the upper abdomen. Musculoskeletal: No change in the spine. Midthoracic vertebral body mild compression fracture is stable. Review of the MIP images confirms the above findings. IMPRESSION: 1. Unfortunately, the intravenous dose of Isovue 370 extravasated into the patient's right upper extremity as described above. The patient was examined and was experiencing no immediate distress. Extravasation orders are being placed by the CT technologist. The patient was also given instructions verbally. 2. Pulmonary emboli could not be assessed without contrast. 3. Atherosclerotic changes in the nonaneurysmal thoracic aorta. Coronary artery stent in the LAD. Atherosclerotic change in the right coronary artery. 4. Bilateral breast implants. 5. No other significant abnormalities. Aortic Atherosclerosis (ICD10-I70.0). Electronically Signed   By: Dorise Bullion III M.D   On: 01/04/2018 15:55   Dg Chest Port 1 View  Result Date: 01/04/2018 CLINICAL DATA:  Difficulty swallowing and breathing after stent placement. EXAM: PORTABLE CHEST 1 VIEW COMPARISON:  12/26/2017. FINDINGS: The heart size and  mediastinal contours are within normal limits. Both lungs are clear. The visualized skeletal structures are unremarkable. Compared with priors, improved aeration. IMPRESSION: No active disease. Electronically Signed   By: Staci Righter M.D.   On: 01/04/2018 12:51    Cardiac Studies   Repeat troponin not yet available  Patient Profile     67 y.o. female with moderate to severe ischemic cardiomyopathy, widespread multivessel CAD status post PCI-DES LAD 12/30/2016, readmitted with complaints of shortness of breath after being unable to get her medications for approximately 3 days.  Now asymptomatic.  Assessment & Plan    1. CAD: Currently asymptomatic.  Waiting for repeat troponin level.  Hopefully this will be downtrending.  Strongly doubt that she had stent thrombosis.  We can ensure that all her medications are provided and she can be compliant with antiplatelet therapy, ready for discharge today. 2. CHF: Appears clinically euvolemic, able to lie fully supine in bed without shortness of breath. 3. HTN: Well-controlled. [facility] will sign off.   Medication Recommendations: No changes in medications  Other recommendations (labs, testing, etc):  N/A (unless follow-up troponin is unexpectedly elevated further, in which case would continue to trend) Follow up as an outpatient:  01/08/2018 1100h, Kerin Ransom, [place].  For questions or updates, please contact [place] Please consult [email or web address] for contact info under        Signed, Sanda Klein, MD  01/05/2018, 11:25 AM

## 2018-01-05 NOTE — Discharge Summary (Signed)
Physician Discharge Summary  [name] QAS:[id number] DOB: June 25, 1950 DOA: 01/04/2018  PCP: Medicine, [place] date: 01/04/2018 Discharge date: 01/05/2018  Time spent: 45 minutes  Recommendations for Outpatient Follow-up:  1. Follow up with cardiology 3-4 weeks 2. Follow up with PCP 1-2 weeks for optimal glycemic control   Discharge Diagnoses:  Principal Problem:   Ischemic chest pain [facility]) Active Problems:   Hypertension   Breast cancer ([place])   Diabetes mellitus without complication ([place])   Acute CHF (congestive heart failure) ([facility])   Chest pain   Chronic systolic heart failure ([place])   Discharge Condition: stable  Diet recommendation: heart healthy carb modified  Filed Weights   01/04/18 1125  Weight: 73 kg    History of present illness:  Andrea [name] is a 67 y.o. female with medical history significant for breast cancer status post bilateral mastectomy 2018 coronary artery disease, congestive heart failure who was discharged 4 days ago after cardiac catheterization on January 01, 2018, with intervention on the LAD 90% stenosis with drug-eluting stent successfully placed.  She stated she was never been quite well since discharge ,she continued to have chest pain and shortness of breath, her symptoms are worse with exertion also apparently she was not able to obtained her medication for which she was discharged particularly her Brilinta.  Hospital Course:  1. CAD: Currently asymptomatic. Initial troponin elevated trending down at discharge. Evaluated by cardiology who opine not likely stent thrombosis. Recommends discharge and OP cardiology follow up. Recommend compliance with antiplatelet therapy. 2. CHF: Appears clinically euvolemic, able to lie fully supine in bed without shortness of breath. 3. HTN: Well-controlled  Procedures:    Consultations:  Dr. Sallyanne Kuster  Discharge Exam: Vitals:   01/04/18 1949 01/05/18 0403   BP: (!) 142/74 115/81  Pulse: 84 77  Resp: 18 17  Temp: 98.6 F (37 C) 98.4 F (36.9 C)  SpO2: 95% 95%    General: awake alert no acute distress  Cardiovascular: rrr no MGR no LE edema Respiratory: normal effort BS distant but cleare  Discharge Instructions   Discharge Instructions    Call MD for:  difficulty breathing, headache or visual disturbances   Complete by:  As directed    Call MD for:  extreme fatigue   Complete by:  As directed    Call MD for:  persistant dizziness or light-headedness   Complete by:  As directed    Call MD for:  persistant nausea and vomiting   Complete by:  As directed    Diet - low sodium heart healthy   Complete by:  As directed    Discharge instructions   Complete by:  As directed    Follow up with cardiology 3-4 weeks Take medications as directed   Increase activity slowly   Complete by:  As directed      Allergies as of 01/05/2018      Reactions   Aspirin Nausea And Vomiting   Maxzide [hydrochlorothiazide W-triamterene] Swelling   Fluid retention   Metformin And Related Diarrhea   Nsaids Nausea And Vomiting   Other Other (See Comments)   All steroids produce psychosis per pt Artificial sweeteners produce nausea and upset stomach.   Pravachol [pravastatin] Other (See Comments)   unknown   Stadol [butorphanol] Other (See Comments)   Toradol, etc And related- hallucinations   Toradol [ketorolac Tromethamine] Other (See Comments)   hallucinations   Vistaril [hydroxyzine Hcl] Other (See Comments)   unknown   Erythromycin  Itching, Rash   Morphine And Related Hives, Rash   Iv morphine.   Penicillins Hives, Itching, Rash   Has patient had a PCN reaction causing immediate rash, facial/tongue/throat swelling, SOB or lightheadedness with hypotension: yes Has patient had a PCN reaction causing severe rash involving mucus membranes or skin necrosis: unknown Has patient had a PCN reaction that required hospitalization : unknown Has  patient had a PCN reaction occurring within the last 10 years: no If all of the above answers are "NO", then may proceed with Cephalosporin use.      Medication List    TAKE these medications   ALPRAZolam 0.5 MG tablet Commonly known as:  XANAX Take 0.5 mg by mouth 4 (four) times daily as needed for anxiety.   atorvastatin 80 MG tablet Commonly known as:  LIPITOR Take 1 tablet (80 mg total) by mouth at bedtime.   carvedilol 6.25 MG tablet Commonly known as:  COREG Take 1 tablet (6.25 mg total) by mouth 2 (two) times daily with a meal.   divalproex 250 MG DR tablet Commonly known as:  DEPAKOTE Take 1,000 mg by mouth at bedtime.   ELDERBERRY PO Take 1 Dose by mouth daily.   furosemide 20 MG tablet Commonly known as:  LASIX Take 1 tablet (20 mg total) by mouth daily. What changed:  how much to take   LANTUS SOLOSTAR 100 UNIT/ML Solostar Pen Generic drug:  Insulin Glargine Inject 20-50 Units into the skin daily. Take based on blood sugar   levothyroxine 200 MCG tablet Commonly known as:  SYNTHROID, LEVOTHROID Take 1 tablet (200 mcg total) by mouth at bedtime. What changed:  when to take this   MORPHABOND ER 30 MG T12a Generic drug:  Morphine Sulfate ER Take 1 tablet by mouth every 12 (twelve) hours.   oxyCODONE-acetaminophen 10-325 MG tablet Commonly known as:  PERCOCET Take 1 tablet by mouth 3 (three) times daily as needed for pain.   pregabalin 75 MG capsule Commonly known as:  LYRICA Take 1 capsule (75 mg total) 2 (two) times daily by mouth.   promethazine 25 MG tablet Commonly known as:  PHENERGAN Take 25 mg by mouth every 6 (six) hours as needed for nausea.   sacubitril-valsartan 24-26 MG Commonly known as:  ENTRESTO Take 1 tablet by mouth 2 (two) times daily.   spironolactone 25 MG tablet Commonly known as:  ALDACTONE Take 0.5 tablets (12.5 mg total) by mouth daily.   ticagrelor 90 MG Tabs tablet Commonly known as:  BRILINTA Take 1 tablet (90 mg  total) by mouth 2 (two) times daily.      Allergies  Allergen Reactions  . Aspirin Nausea And Vomiting  . Maxzide [Hydrochlorothiazide W-Triamterene] Swelling    Fluid retention  . Metformin And Related Diarrhea  . Nsaids Nausea And Vomiting  . Other Other (See Comments)    All steroids produce psychosis per pt Artificial sweeteners produce nausea and upset stomach.  Gregary Cromer [Pravastatin] Other (See Comments)    unknown  . Stadol [Butorphanol] Other (See Comments)    Toradol, etc And related- hallucinations  . Toradol [Ketorolac Tromethamine] Other (See Comments)    hallucinations  . Vistaril [Hydroxyzine Hcl] Other (See Comments)    unknown  . Erythromycin Itching and Rash  . Morphine And Related Hives and Rash    Iv morphine.  . Penicillins Hives, Itching and Rash    Has patient had a PCN reaction causing immediate rash, facial/tongue/throat swelling, SOB or lightheadedness with hypotension: yes  Has patient had a PCN reaction causing severe rash involving mucus membranes or skin necrosis: unknown Has patient had a PCN reaction that required hospitalization : unknown Has patient had a PCN reaction occurring within the last 10 years: no If all of the above answers are "NO", then may proceed with Cephalosporin use.       The results of significant diagnostics from this hospitalization (including imaging, microbiology, ancillary and laboratory) are listed below for reference.    Significant Diagnostic Studies: Ct Angio Chest Pe W And/or Wo Contrast  Result Date: 01/04/2018 CLINICAL DATA:  Shortness of breath and chest pain. Unfortunately, the patient's entire 75 mL dose of Isovue 370 extravasated into the right upper extremity. The injection was via an antecubital IV. I examined the patient and she have good pulses in her wrist and capillary refill in her fingers. The extravasation could be palpated above the level of the elbow. Orders for management of the extravasation  are being placed by the CT technologist. I also discussed the need for intermittent icing and elevation with the patient. I also explained the need to notify somebody immediately if she develops pain or coldness in her upper extremity distal to the antecubital fossa. The referring clinical team should also be notified immediately of any skin changes. EXAM: CT ANGIOGRAPHY CHEST WITH CONTRAST TECHNIQUE: Multidetector CT imaging of the chest was performed using the standard protocol during bolus administration of intravenous contrast. Multiplanar CT image reconstructions and MIPs were obtained to evaluate the vascular anatomy. CONTRAST:  14mL ISOVUE-370 IOPAMIDOL (ISOVUE-370) INJECTION 76% COMPARISON:  November 30, 2015 FINDINGS: Cardiovascular: Atherosclerotic changes are seen in the nonaneurysmal thoracic aorta. The lack of contrast precludes evaluation for dissection. The central pulmonary arteries are normal in caliber. Pulmonary emboli could not be evaluated due to lack of contrast. There appears to be a coronary stent in the LAD. Coronary artery calcifications are seen in the right coronary artery. The heart size is normal. Mediastinum/Nodes: Bilateral breast implants. No adenopathy. No effusions. No other mediastinal abnormalities. Lungs/Pleura: Central airways are normal. No pneumothorax. No pulmonary nodules, masses, or focal infiltrates. Upper Abdomen: Previous cholecystectomy. No other abnormalities in the upper abdomen. Musculoskeletal: No change in the spine. Midthoracic vertebral body mild compression fracture is stable. Review of the MIP images confirms the above findings. IMPRESSION: 1. Unfortunately, the intravenous dose of Isovue 370 extravasated into the patient's right upper extremity as described above. The patient was examined and was experiencing no immediate distress. Extravasation orders are being placed by the CT technologist. The patient was also given instructions verbally. 2. Pulmonary  emboli could not be assessed without contrast. 3. Atherosclerotic changes in the nonaneurysmal thoracic aorta. Coronary artery stent in the LAD. Atherosclerotic change in the right coronary artery. 4. Bilateral breast implants. 5. No other significant abnormalities. Aortic Atherosclerosis (ICD10-I70.0). Electronically Signed   By: Dorise Bullion III M.D   On: 01/04/2018 15:55   Dg Chest Port 1 View  Result Date: 01/04/2018 CLINICAL DATA:  Difficulty swallowing and breathing after stent placement. EXAM: PORTABLE CHEST 1 VIEW COMPARISON:  12/26/2017. FINDINGS: The heart size and mediastinal contours are within normal limits. Both lungs are clear. The visualized skeletal structures are unremarkable. Compared with priors, improved aeration. IMPRESSION: No active disease. Electronically Signed   By: Staci Righter M.D.   On: 01/04/2018 12:51   Dg Chest Portable 1 View  Result Date: 12/26/2017 CLINICAL DATA:  Initial evaluation for acute chest pain. EXAM: PORTABLE CHEST 1 VIEW COMPARISON:  Prior  radiograph from 02/17/2017 FINDINGS: Cardiomegaly, stable. Mediastinal silhouette within normal limits. Lungs mildly hypoinflated. Diffuse vascular congestion with interstitial prominence, compatible with pulmonary interstitial edema. Asymmetric hazy opacity within the right lung base could reflect asymmetric edema or possibly infiltrate. No appreciable pleural effusion. No pneumothorax. No acute osseus abnormality. IMPRESSION: 1. Cardiomegaly with mild diffuse pulmonary interstitial edema. 2. Superimposed asymmetric hazy opacity within the right lung base, which could reflect asymmetric edema or possibly infiltrate. Electronically Signed   By: Jeannine Boga M.D.   On: 12/26/2017 04:13   Vas [place] Lower Extremity Venous (dvt)  Result Date: 12/28/2017  Lower Venous Study Indications: Edema.  Limitations: Poor ultrasound/tissue interface and acoustic shadowing. Performing Technologist: Landry Mellow RDMS, RVT   Examination Guidelines: A complete evaluation includes B-mode imaging, spectral Doppler, color Doppler, and power Doppler as needed of all accessible portions of each vessel. Bilateral testing is considered an integral part of a complete examination. Limited examinations for reoccurring indications may be performed as noted.  Right Venous Findings: +---------+---------------+---------+-----------+----------+-------+          CompressibilityPhasicitySpontaneityPropertiesSummary +---------+---------------+---------+-----------+----------+-------+ CFV      Full           Yes      Yes                          +---------+---------------+---------+-----------+----------+-------+ SFJ      Full                                                 +---------+---------------+---------+-----------+----------+-------+ FV Prox  Full                                                 +---------+---------------+---------+-----------+----------+-------+ FV Mid   Full                                                 +---------+---------------+---------+-----------+----------+-------+ FV DistalFull                                                 +---------+---------------+---------+-----------+----------+-------+ PFV      Full                                                 +---------+---------------+---------+-----------+----------+-------+ POP      Full           Yes      Yes                          +---------+---------------+---------+-----------+----------+-------+ PTV      Full                                                 +---------+---------------+---------+-----------+----------+-------+  PERO     Full                                                 +---------+---------------+---------+-----------+----------+-------+  Left Venous Findings: +---------+---------------+---------+-----------+----------+-------+           CompressibilityPhasicitySpontaneityPropertiesSummary +---------+---------------+---------+-----------+----------+-------+ CFV      Full           Yes      Yes                          +---------+---------------+---------+-----------+----------+-------+ SFJ      Full                                                 +---------+---------------+---------+-----------+----------+-------+ FV Prox  Full                                                 +---------+---------------+---------+-----------+----------+-------+ FV Mid   Full                                                 +---------+---------------+---------+-----------+----------+-------+ FV DistalFull                                                 +---------+---------------+---------+-----------+----------+-------+ PFV      Full                                                 +---------+---------------+---------+-----------+----------+-------+ POP      Full           Yes      Yes                          +---------+---------------+---------+-----------+----------+-------+ PTV      Full                                                 +---------+---------------+---------+-----------+----------+-------+ PERO     Full                                                 +---------+---------------+---------+-----------+----------+-------+    Summary: Right: There is no evidence of deep vein thrombosis in the lower extremity. No cystic structure found in the popliteal fossa. Left: There is no evidence of deep vein thrombosis in the lower extremity. No cystic structure found in the popliteal fossa.  *  See table(s) above for measurements and observations. Electronically signed by Harold Barban MD on 12/28/2017 at 8:26:03 AM.    Final     Microbiology: No results found for this or any previous visit (from the past 240 hour(s)).   Labs: Basic Metabolic Panel: Recent Labs  Lab 12/30/17 0351 12/31/17 0417  01/04/18 1228 01/05/18 0611  NA 140 138 140 140  K 4.0 3.7 4.3 4.0  CL 102 103 103 104  CO2 28 27 23 26   GLUCOSE 205* 177* 189* 167*  BUN 17 11 23 19   CREATININE 1.00 0.96 1.05* 1.14*  CALCIUM 8.3* 8.8* 9.0 8.8*   Liver Function Tests: No results for input(s): AST, ALT, ALKPHOS, BILITOT, PROT, ALBUMIN in the last 168 hours. No results for input(s): LIPASE, AMYLASE in the last 168 hours. No results for input(s): AMMONIA in the last 168 hours. CBC: Recent Labs  Lab 12/30/17 0351 12/31/17 0417 01/04/18 1327 01/05/18 0611  WBC 7.0 8.0 9.3 7.5  NEUTROABS  --   --  5.3  --   HGB 11.5* 11.7* 10.9* 10.2*  HCT 34.9* 35.8* 34.8* 31.8*  MCV 89.7 87.7 92.6 90.1  PLT 192 230 244 213   Cardiac Enzymes: Recent Labs  Lab 01/05/18 1102  TROPONINI 0.13*   BNP: BNP (last 3 results) Recent Labs    12/26/17 0504 01/04/18 1327  BNP 714.4* 333.2*    ProBNP (last 3 results) No results for input(s): PROBNP in the last 8760 hours.  CBG: Recent Labs  Lab 12/30/17 2154 12/31/17 0644 01/04/18 2037 01/05/18 0747 01/05/18 1200  GLUCAP 156* 171* 125* 146* 200*       Signed:  Radene Gunning NP [facility] 01/05/2018, 2:47 PM

## 2018-01-05 NOTE — Care Management Note (Signed)
Case Management Note  Patient Details  Name: Andrea [name] MRN: [id number] Date of Birth: November 08, 1950  Subjective/Objective:  67yo female presented with chest pain and SOB.             Action/Plan: CM consult acknowledged for medication assist. CM met with patient to discuss transitional needs. Patient stated living at home alone, ambulates with a RW; son lives 7 miles away and assists as needed. Patient has active insurance with [facility] Medicare; Kary Kos and Delene Loll was filled and provided to patient on 12/31/17; patient verbalized to CM that she has obtained all of her Rx since transitioning home on 12/31/17. PCP verified as: [place] [facility]; pharmacy of choice[facility]. No further needs from CM.   Expected Discharge Date:  01/05/18               Expected Discharge Plan:  Home/Self Care  In-House Referral:  NA  Discharge planning Services  CM Consult  Post Acute Care Choice:  NA Choice offered to:  NA  DME Arranged:  N/A DME Agency:  NA  HH Arranged:  NA HH Agency:  NA  Status of Service:  Completed, signed off  If discussed at [place] of Stay Meetings, dates discussed:    Additional Comments:  Midge Minium RN, BSN, NCM-BC, ACM-RN [phone] 01/05/2018, 11:45 AM

## 2018-01-05 NOTE — Telephone Encounter (Signed)
Pt insurance is active and benefits verified through [facility] Medicare. Co-pay $20.00, DED $0.00/$0.00 met, out of pocket $4,000.00/$2,897.77 met, co-insurance 0%. No pre-authorization required. Passport, 01/05/18 @ 10:34AM, REF# [id number]  Will contact patient to see if she is interested in the Cardiac Rehab Program. If interested, patient will need to complete follow up appt. Once completed, patient will be contacted for scheduling upon review by the RN Navigator.

## 2018-01-06 NOTE — Telephone Encounter (Signed)
Unable to reach patient via phone.

## 2018-01-08 ENCOUNTER — Ambulatory Visit: Payer: Medicare Other | Admitting: Cardiology

## 2018-01-08 ENCOUNTER — Encounter: Payer: Self-pay | Admitting: Cardiology

## 2018-01-08 VITALS — BP 134/74 | HR 85 | Ht 65.0 in | Wt 156.6 lb

## 2018-01-08 DIAGNOSIS — I5021 Acute systolic (congestive) heart failure: Secondary | ICD-10-CM | POA: Diagnosis not present

## 2018-01-08 DIAGNOSIS — Z853 Personal history of malignant neoplasm of breast: Secondary | ICD-10-CM

## 2018-01-08 DIAGNOSIS — I255 Ischemic cardiomyopathy: Secondary | ICD-10-CM

## 2018-01-08 DIAGNOSIS — I429 Cardiomyopathy, unspecified: Secondary | ICD-10-CM | POA: Insufficient documentation

## 2018-01-08 DIAGNOSIS — E119 Type 2 diabetes mellitus without complications: Secondary | ICD-10-CM

## 2018-01-08 DIAGNOSIS — IMO0001 Reserved for inherently not codable concepts without codable children: Secondary | ICD-10-CM

## 2018-01-08 DIAGNOSIS — Z9861 Coronary angioplasty status: Secondary | ICD-10-CM

## 2018-01-08 DIAGNOSIS — I5022 Chronic systolic (congestive) heart failure: Secondary | ICD-10-CM | POA: Diagnosis not present

## 2018-01-08 DIAGNOSIS — Z889 Allergy status to unspecified drugs, medicaments and biological substances status: Secondary | ICD-10-CM | POA: Insufficient documentation

## 2018-01-08 DIAGNOSIS — G8929 Other chronic pain: Secondary | ICD-10-CM

## 2018-01-08 DIAGNOSIS — I2510 Atherosclerotic heart disease of native coronary artery without angina pectoris: Secondary | ICD-10-CM | POA: Diagnosis not present

## 2018-01-08 DIAGNOSIS — Z794 Long term (current) use of insulin: Secondary | ICD-10-CM

## 2018-01-08 LAB — CBC WITH DIFFERENTIAL/PLATELET
Basophils Absolute: 0 10*3/uL (ref 0.0–0.2)
Basos: 0 %
EOS (ABSOLUTE): 0.4 10*3/uL (ref 0.0–0.4)
Eos: 4 %
Hematocrit: 35.1 % (ref 34.0–46.6)
Hemoglobin: 11.8 g/dL (ref 11.1–15.9)
Immature Grans (Abs): 0 10*3/uL (ref 0.0–0.1)
Immature Granulocytes: 0 %
Lymphocytes Absolute: 2.6 10*3/uL (ref 0.7–3.1)
Lymphs: 27 %
MCH: 30.3 pg (ref 26.6–33.0)
MCHC: 33.6 g/dL (ref 31.5–35.7)
MCV: 90 fL (ref 79–97)
Monocytes Absolute: 0.6 10*3/uL (ref 0.1–0.9)
Monocytes: 6 %
Neutrophils Absolute: 5.9 10*3/uL (ref 1.4–7.0)
Neutrophils: 63 %
Platelets: 326 10*3/uL (ref 150–450)
RBC: 3.9 x10E6/uL (ref 3.77–5.28)
RDW: 12.4 % (ref 12.3–15.4)
WBC: 9.5 10*3/uL (ref 3.4–10.8)

## 2018-01-08 LAB — BASIC METABOLIC PANEL
BUN/Creatinine Ratio: 19 (ref 12–28)
BUN: 23 mg/dL (ref 8–27)
CO2: 25 mmol/L (ref 20–29)
Calcium: 9.7 mg/dL (ref 8.7–10.3)
Chloride: 95 mmol/L — ABNORMAL LOW (ref 96–106)
Creatinine, Ser: 1.18 mg/dL — ABNORMAL HIGH (ref 0.57–1.00)
GFR calc Af Amer: 56 mL/min/{1.73_m2} — ABNORMAL LOW (ref >59)
GFR calc non Af Amer: 48 mL/min/{1.73_m2} — ABNORMAL LOW (ref >59)
Glucose: 186 mg/dL — ABNORMAL HIGH (ref 65–99)
Potassium: 4.6 mmol/L (ref 3.5–5.2)
Sodium: 138 mmol/L (ref 134–144)

## 2018-01-08 MED ORDER — FUROSEMIDE 40 MG PO TABS: 40.0000 mg | ORAL_TABLET | Freq: Every day | ORAL | 2 refills | Status: DC

## 2018-01-08 NOTE — Assessment & Plan Note (Signed)
Pt admitted 12/26/17 with acute CHF

## 2018-01-08 NOTE — Progress Notes (Signed)
01/08/2018 Andrea [name]   [birth year]  [id number]  Primary Physician Medicine, [facility] Primary Cardiologist: Dr Stanford Breed  HPI: Patient is a 67 year old female who presented 12/26/2017 with chest pain and shortness of breath.  Echocardiogram done 12/26/2017 showed LV dysfunction with an ejection fraction of 30 to 35%.  Patient underwent diagnostic catheterization 12/29/2017.  This revealed severe three-vessel disease.  Patient was considered for bypass grafting but it was felt PCI would be the best therapy.  On 12/30/2017 she underwent intervention to her LAD with a proximal and distal stent placed.  She has residual 90% diagonal and 80% OM1 stenosis.  She was discharged 12/31/2017 and then readmitted back in the emergency room 01/04/2018.  Somehow she had not gotten her prescriptions.  She was admitted overnight and ruled out and then discharged.  She is in the office today for follow-up.  The patient tells me she is taking her medications.  She has multiple complaints.  She says she is short of breath with any activity.  She says she has to sleep on 6 pillows.  She has nausea.  She is dizzy.  In the office her O2 sat on room air is 98%.  She is not orthostatic.  Patient thought she might need oxygen at home.   Current Outpatient Medications  Medication Sig Dispense Refill  . ALPRAZolam (XANAX) 0.5 MG tablet Take 0.5 mg by mouth 4 (four) times daily as needed for anxiety.     [name] atorvastatin (LIPITOR) 80 MG tablet Take 1 tablet (80 mg total) by mouth at bedtime. 30 tablet 3  . carvedilol (COREG) 6.25 MG tablet Take 1 tablet (6.25 mg total) by mouth 2 (two) times daily with a meal. 60 tablet 3  . divalproex (DEPAKOTE) 250 MG DR tablet Take 1,000 mg by mouth at bedtime.     [name] ELDERBERRY PO Take 1 Dose by mouth daily.    . furosemide (LASIX) 40 MG tablet Take 1 tablet (40 mg total) by mouth daily. 30 tablet 2  . LANTUS SOLOSTAR 100 UNIT/ML Solostar Pen Inject 20-50 Units  into the skin daily. Take based on blood sugar  1  . levothyroxine (SYNTHROID, LEVOTHROID) 200 MCG tablet Take 1 tablet (200 mcg total) by mouth at bedtime. (Patient taking differently: Take 200 mcg by mouth daily before breakfast. ) 30 tablet 0  . MORPHABOND ER 30 MG T12A Take 1 tablet by mouth every 12 (twelve) hours.  0  . oxyCODONE-acetaminophen (PERCOCET) 10-325 MG tablet Take 1 tablet by mouth 3 (three) times daily as needed for pain.  0  . pregabalin (LYRICA) 75 MG capsule Take 1 capsule (75 mg total) 2 (two) times daily by mouth. 60 capsule 0  . promethazine (PHENERGAN) 25 MG tablet Take 25 mg by mouth every 6 (six) hours as needed for nausea.   1  . sacubitril-valsartan (ENTRESTO) 24-26 MG Take 1 tablet by mouth 2 (two) times daily. 60 tablet 3  . spironolactone (ALDACTONE) 25 MG tablet Take 0.5 tablets (12.5 mg total) by mouth daily. 30 tablet 3  . ticagrelor (BRILINTA) 90 MG TABS tablet Take 1 tablet (90 mg total) by mouth 2 (two) times daily. 60 tablet 3   No current facility-administered medications for this visit.     Allergies  Allergen Reactions  . Aspirin Nausea And Vomiting  . Maxzide [Hydrochlorothiazide W-Triamterene] Swelling    Fluid retention  . Metformin And Related Diarrhea  . Nsaids Nausea And Vomiting  . Other Other (See  Comments)    All steroids produce psychosis per pt Artificial sweeteners produce nausea and upset stomach.  Gregary Cromer [Pravastatin] Other (See Comments)    unknown  . Stadol [Butorphanol] Other (See Comments)    Toradol, etc And related- hallucinations  . Toradol [Ketorolac Tromethamine] Other (See Comments)    hallucinations  . Vistaril [Hydroxyzine Hcl] Other (See Comments)    unknown  . Erythromycin Itching and Rash  . Morphine And Related Hives and Rash    Iv morphine.  . Penicillins Hives, Itching and Rash    Has patient had a PCN reaction causing immediate rash, facial/tongue/throat swelling, SOB or lightheadedness with  hypotension: yes Has patient had a PCN reaction causing severe rash involving mucus membranes or skin necrosis: unknown Has patient had a PCN reaction that required hospitalization : unknown Has patient had a PCN reaction occurring within the last 10 years: no If all of the above answers are "NO", then may proceed with Cephalosporin use.     Past Medical History:  Diagnosis Date  . Breast cancer ([place])    bilat mastectomy and LUE lymphadenectomy 2014, chemoRx 2014 - 15  . Diabetes mellitus without complication ([place])   . Esophageal reflux   . Hypertension   . Hypothyroid   . Migraine   . Seizure [facility])     Social History   Socioeconomic History  . Marital status: Divorced    Spouse name: Not on file  . Number of children: Not on file  . Years of education: Not on file  . Highest education level: Not on file  Occupational History  . Not on file  Social Needs  . Financial resource strain: Not on file  . Food insecurity:    Worry: Never true    Inability: Never true  . Transportation needs:    Medical: No    Non-medical: No  Tobacco Use  . Smoking status: Never Smoker  . Smokeless tobacco: Never Used  Substance and Sexual Activity  . Alcohol use: No  . Drug use: No  . Sexual activity: Not Currently  Lifestyle  . Physical activity:    Days per week: Patient refused    Minutes per session: Patient refused  . Stress: Only a little  Relationships  . Social connections:    Talks on phone: Patient refused    Gets together: Patient refused    Attends religious service: Patient refused    Active member of club or organization: Patient refused    Attends meetings of clubs or organizations: Patient refused    Relationship status: Patient refused  . Intimate partner violence:    Fear of current or ex partner: Patient refused    Emotionally abused: Patient refused    Physically abused: Patient refused    Forced sexual activity: Patient refused  Other Topics Concern  .  Not on file  Social History Narrative   NONE     Family History  Problem Relation Age of Onset  . Sudden Cardiac Death Neg Hx      Review of Systems: General: negative for chills, fever, night sweats  Cardiovascular: negative for chest pain, palpitations, paroxysmal nocturnal dyspnea  Dermatological: negative for rash Respiratory: negative for cough or wheezing Urologic: negative for hematuria Abdominal: negative for vomiting, diarrhea, bright red blood per rectum, melena, or hematemesis Neurologic: negative for visual changes, syncope, or dizziness All other systems reviewed and are otherwise negative except as noted above.    Blood pressure 134/74, pulse 85, height 5'  5" (1.651 m), weight 156 lb 9.6 oz (71 kg).  General appearance: alert, cooperative and no distress Neck: no carotid bruit and no JVD Lungs: clear to auscultation bilaterally Heart: regular rate and rhythm Extremities: no edema Skin: pale, warm, dry Neurologic: Grossly normal  EKG NSR  ASSESSMENT AND PLAN:   CAD S/P percutaneous coronary angioplasty pLAD and dLAD PCI with DES 12/30/17 (turned down for CABG). Residual Dx1 90% and 80% OM1.  Acute CHF (congestive heart failure) ([place]) Pt admitted 12/26/17 with acute CHF  Cardiomyopathy ([place]) EF 30-35% by echo 12/26/17  Drug allergy, multiple Including ASA. Plan is for Brilinta BID for 12 months, then low dose Brilinta indefinitely .   Insulin dependent diabetes mellitus ([place]) [facility] PCP at [facility]  History of breast cancer S/P bilateral mastectomy and chemotherapy 2014  History of seizures On Depakote  Chronic pain On chronic Morphine and Lyrica   PLAN I suggested she increase her Lasix to 40 mg daily.  She does not appear to have heart failure on exam but her history is consistent with orthopnea.  I will see her back in a week.  I did check a BM P today and will adjust my recommendations of this shows she is dehydrated.   Kerin Ransom  PA-C 01/08/2018 11:54 AM

## 2018-01-08 NOTE — Assessment & Plan Note (Signed)
On Depakote

## 2018-01-08 NOTE — Assessment & Plan Note (Signed)
On chronic Morphine and Lyrica

## 2018-01-08 NOTE — Assessment & Plan Note (Signed)
Including ASA. Plan is for Brilinta BID for 12 months, then low dose Brilinta indefinitely .

## 2018-01-08 NOTE — Assessment & Plan Note (Signed)
Followed by PCP at [facility]

## 2018-01-08 NOTE — Assessment & Plan Note (Signed)
S/P bilateral mastectomy and chemotherapy 2014

## 2018-01-08 NOTE — Assessment & Plan Note (Signed)
EF 30-35% by echo 12/26/17

## 2018-01-08 NOTE — Patient Instructions (Addendum)
Medication Instructions:  Your physician has recommended you make the following change in your medication:   INCREASE Lasix to 40mg  daily. An Rx has been sent to your pharmacy. If you need a refill on your cardiac medications before your next appointment, please call your pharmacy.   Lab work: Risk manager today If you have labs (blood work) drawn today and your tests are completely normal, you will receive your results only by: [name] MyChart Message (if you have MyChart) OR . A paper copy in the mail If you have any lab test that is abnormal or we need to change your treatment, we will call you to review the results.  Testing/Procedures: None ordered  Follow-Up: At [facility], you and your health needs are our priority.  As part of our continuing mission to provide you with exceptional heart care, we have created designated Provider Care Teams.  These Care Teams include your primary Cardiologist (physician) and Advanced Practice Providers (APPs -  Physician Assistants and Nurse Practitioners) who all work together to provide you with the care you need, when you need it.  .Your physician recommends that you schedule a follow-up appointment in: 1 week

## 2018-01-08 NOTE — Assessment & Plan Note (Signed)
pLAD and dLAD PCI with DES 12/30/17 (turned down for CABG). Residual Dx1 90% and 80% OM1.

## 2018-01-17 ENCOUNTER — Other Ambulatory Visit: Payer: Self-pay

## 2018-01-17 ENCOUNTER — Encounter (HOSPITAL_COMMUNITY): Payer: Self-pay | Admitting: Emergency Medicine

## 2018-01-17 ENCOUNTER — Emergency Department (HOSPITAL_COMMUNITY): Payer: Medicare Other

## 2018-01-17 ENCOUNTER — Observation Stay (HOSPITAL_COMMUNITY)
Admission: EM | Admit: 2018-01-17 | Discharge: 2018-01-22 | Disposition: A | Discharge status: Home / Self Care | Payer: Medicare Other | Attending: Internal Medicine | Admitting: Internal Medicine

## 2018-01-17 DIAGNOSIS — Z79899 Other long term (current) drug therapy: Secondary | ICD-10-CM | POA: Diagnosis not present

## 2018-01-17 DIAGNOSIS — G8929 Other chronic pain: Secondary | ICD-10-CM | POA: Insufficient documentation

## 2018-01-17 DIAGNOSIS — J9601 Acute respiratory failure with hypoxia: Secondary | ICD-10-CM | POA: Insufficient documentation

## 2018-01-17 DIAGNOSIS — Z79890 Hormone replacement therapy (postmenopausal): Secondary | ICD-10-CM | POA: Diagnosis not present

## 2018-01-17 DIAGNOSIS — E119 Type 2 diabetes mellitus without complications: Secondary | ICD-10-CM | POA: Diagnosis not present

## 2018-01-17 DIAGNOSIS — I2510 Atherosclerotic heart disease of native coronary artery without angina pectoris: Secondary | ICD-10-CM | POA: Insufficient documentation

## 2018-01-17 DIAGNOSIS — G43909 Migraine, unspecified, not intractable, without status migrainosus: Secondary | ICD-10-CM | POA: Insufficient documentation

## 2018-01-17 DIAGNOSIS — G9340 Encephalopathy, unspecified: Secondary | ICD-10-CM | POA: Insufficient documentation

## 2018-01-17 DIAGNOSIS — IMO0001 Reserved for inherently not codable concepts without codable children: Secondary | ICD-10-CM

## 2018-01-17 DIAGNOSIS — R110 Nausea: Secondary | ICD-10-CM

## 2018-01-17 DIAGNOSIS — I110 Hypertensive heart disease with heart failure: Secondary | ICD-10-CM | POA: Insufficient documentation

## 2018-01-17 DIAGNOSIS — I5022 Chronic systolic (congestive) heart failure: Secondary | ICD-10-CM | POA: Diagnosis not present

## 2018-01-17 DIAGNOSIS — I255 Ischemic cardiomyopathy: Secondary | ICD-10-CM | POA: Diagnosis not present

## 2018-01-17 DIAGNOSIS — E039 Hypothyroidism, unspecified: Secondary | ICD-10-CM | POA: Insufficient documentation

## 2018-01-17 DIAGNOSIS — Z7902 Long term (current) use of antithrombotics/antiplatelets: Secondary | ICD-10-CM | POA: Insufficient documentation

## 2018-01-17 DIAGNOSIS — Z9049 Acquired absence of other specified parts of digestive tract: Secondary | ICD-10-CM | POA: Insufficient documentation

## 2018-01-17 DIAGNOSIS — I10 Essential (primary) hypertension: Secondary | ICD-10-CM | POA: Diagnosis present

## 2018-01-17 DIAGNOSIS — R079 Chest pain, unspecified: Secondary | ICD-10-CM | POA: Diagnosis not present

## 2018-01-17 DIAGNOSIS — R509 Fever, unspecified: Secondary | ICD-10-CM

## 2018-01-17 DIAGNOSIS — Z794 Long term (current) use of insulin: Secondary | ICD-10-CM | POA: Insufficient documentation

## 2018-01-17 DIAGNOSIS — J9811 Atelectasis: Secondary | ICD-10-CM | POA: Insufficient documentation

## 2018-01-17 DIAGNOSIS — R569 Unspecified convulsions: Secondary | ICD-10-CM | POA: Diagnosis present

## 2018-01-17 DIAGNOSIS — K219 Gastro-esophageal reflux disease without esophagitis: Secondary | ICD-10-CM | POA: Insufficient documentation

## 2018-01-17 DIAGNOSIS — Z955 Presence of coronary angioplasty implant and graft: Secondary | ICD-10-CM | POA: Insufficient documentation

## 2018-01-17 DIAGNOSIS — Z853 Personal history of malignant neoplasm of breast: Secondary | ICD-10-CM | POA: Insufficient documentation

## 2018-01-17 DIAGNOSIS — K5900 Constipation, unspecified: Secondary | ICD-10-CM | POA: Insufficient documentation

## 2018-01-17 DIAGNOSIS — N179 Acute kidney failure, unspecified: Secondary | ICD-10-CM | POA: Diagnosis present

## 2018-01-17 LAB — TROPONIN I

## 2018-01-17 LAB — CBC
HCT: 33.8 % — ABNORMAL LOW (ref 36.0–46.0)
Hemoglobin: 11.3 g/dL — ABNORMAL LOW (ref 12.0–15.0)
MCH: 30.2 pg (ref 26.0–34.0)
MCHC: 33.4 g/dL (ref 30.0–36.0)
MCV: 90.4 fL (ref 80.0–100.0)
Platelets: 300 10*3/uL (ref 150–400)
RBC: 3.74 MIL/uL — ABNORMAL LOW (ref 3.87–5.11)
RDW: 12.2 % (ref 11.5–15.5)
WBC: 11.9 10*3/uL — AB (ref 4.0–10.5)
nRBC: 0 % (ref 0.0–0.2)

## 2018-01-17 LAB — BRAIN NATRIURETIC PEPTIDE: B Natriuretic Peptide: 267.6 pg/mL — ABNORMAL HIGH (ref 0.0–100.0)

## 2018-01-17 LAB — BASIC METABOLIC PANEL
Anion gap: 13 (ref 5–15)
BUN: 33 mg/dL — ABNORMAL HIGH (ref 8–23)
CO2: 28 mmol/L (ref 22–32)
Calcium: 9.2 mg/dL (ref 8.9–10.3)
Chloride: 97 mmol/L — ABNORMAL LOW (ref 98–111)
Creatinine, Ser: 1.73 mg/dL — ABNORMAL HIGH (ref 0.44–1.00)
GFR calc Af Amer: 35 mL/min — ABNORMAL LOW (ref >60)
GFR calc non Af Amer: 30 mL/min — ABNORMAL LOW (ref >60)
Glucose, Bld: 226 mg/dL — ABNORMAL HIGH (ref 70–99)
Potassium: 4.7 mmol/L (ref 3.5–5.1)
SODIUM: 138 mmol/L (ref 135–145)

## 2018-01-17 LAB — I-STAT TROPONIN, ED: Troponin i, poc: 0.01 ng/mL (ref 0.00–0.08)

## 2018-01-17 MED ORDER — MORPHINE SULFATE ER 30 MG PO TBCR
EXTENDED_RELEASE_TABLET | Freq: Two times a day (BID) | ORAL | Status: DC
  Filled 2018-01-17: qty 1

## 2018-01-17 MED ORDER — LEVOTHYROXINE SODIUM 100 MCG PO TABS
200.0000 ug | ORAL_TABLET | Freq: Every day | ORAL | Status: DC
  Administered 2018-01-18 – 2018-01-22 (×5): 200 ug via ORAL
  Filled 2018-01-17 (×4): qty 2
  Filled 2018-01-17: qty 1

## 2018-01-17 MED ORDER — ENOXAPARIN SODIUM 40 MG/0.4ML ~~LOC~~ SOLN
40.0000 mg | Freq: Every day | SUBCUTANEOUS | Status: DC
  Administered 2018-01-18 – 2018-01-19 (×2): 40 mg via SUBCUTANEOUS
  Filled 2018-01-17 (×3): qty 0.4

## 2018-01-17 MED ORDER — PROMETHAZINE HCL 25 MG PO TABS: 25.0000 mg | ORAL_TABLET | Freq: Four times a day (QID) | ORAL | Status: DC | PRN

## 2018-01-17 MED ORDER — OXYCODONE-ACETAMINOPHEN 10-325 MG PO TABS: 1.0000 | ORAL_TABLET | Freq: Three times a day (TID) | ORAL | Status: DC | PRN

## 2018-01-17 MED ORDER — ALBUTEROL SULFATE (2.5 MG/3ML) 0.083% IN NEBU
5.0000 mg | INHALATION_SOLUTION | Freq: Once | RESPIRATORY_TRACT | Status: AC
  Administered 2018-01-17: 5 mg via RESPIRATORY_TRACT
  Filled 2018-01-17: qty 6

## 2018-01-17 MED ORDER — SODIUM CHLORIDE 0.9 % IV BOLUS
1000.0000 mL | Freq: Once | INTRAVENOUS | Status: AC
  Administered 2018-01-17: 1000 mL via INTRAVENOUS

## 2018-01-17 MED ORDER — IOPAMIDOL (ISOVUE-370) INJECTION 76%
75.0000 mL | Freq: Once | INTRAVENOUS | Status: AC | PRN
  Administered 2018-01-17: 100 mL via INTRAVENOUS

## 2018-01-17 MED ORDER — MORPHINE SULFATE (PF) 4 MG/ML IV SOLN
4.0000 mg | Freq: Once | INTRAVENOUS | Status: AC
  Administered 2018-01-17: 4 mg via INTRAVENOUS
  Filled 2018-01-17: qty 1

## 2018-01-17 MED ORDER — CARVEDILOL 12.5 MG PO TABS
6.2500 mg | ORAL_TABLET | Freq: Two times a day (BID) | ORAL | Status: DC
  Administered 2018-01-18 – 2018-01-22 (×9): 6.25 mg via ORAL
  Filled 2018-01-17 (×9): qty 1

## 2018-01-17 MED ORDER — INSULIN ASPART 100 UNIT/ML ~~LOC~~ SOLN
0.0000 [IU] | Freq: Three times a day (TID) | SUBCUTANEOUS | Status: DC
  Administered 2018-01-18 (×2): 2 [IU] via SUBCUTANEOUS
  Administered 2018-01-18: 3 [IU] via SUBCUTANEOUS
  Administered 2018-01-19 – 2018-01-21 (×4): 2 [IU] via SUBCUTANEOUS
  Administered 2018-01-21: 3 [IU] via SUBCUTANEOUS
  Administered 2018-01-21: 2 [IU] via SUBCUTANEOUS

## 2018-01-17 MED ORDER — TICAGRELOR 90 MG PO TABS
90.0000 mg | ORAL_TABLET | Freq: Two times a day (BID) | ORAL | Status: DC
  Administered 2018-01-18 – 2018-01-19 (×4): 90 mg via ORAL
  Filled 2018-01-17 (×4): qty 1

## 2018-01-17 MED ORDER — ALPRAZOLAM 0.5 MG PO TABS
0.5000 mg | ORAL_TABLET | Freq: Four times a day (QID) | ORAL | Status: DC | PRN
  Administered 2018-01-18 – 2018-01-19 (×3): 0.5 mg via ORAL
  Filled 2018-01-17: qty 2
  Filled 2018-01-17 (×2): qty 1

## 2018-01-17 MED ORDER — ACETAMINOPHEN 325 MG PO TABS
650.0000 mg | ORAL_TABLET | ORAL | Status: DC | PRN
  Filled 2018-01-17: qty 2

## 2018-01-17 MED ORDER — INSULIN ASPART 100 UNIT/ML ~~LOC~~ SOLN: 0.0000 [IU] | Freq: Every day | SUBCUTANEOUS | Status: DC

## 2018-01-17 MED ORDER — OXYCODONE HCL 5 MG PO TABS
5.0000 mg | ORAL_TABLET | Freq: Three times a day (TID) | ORAL | Status: DC | PRN
  Administered 2018-01-18 (×3): 5 mg via ORAL
  Filled 2018-01-17 (×3): qty 1

## 2018-01-17 MED ORDER — IOPAMIDOL (ISOVUE-370) INJECTION 76%
INTRAVENOUS | Status: AC
  Filled 2018-01-17: qty 100

## 2018-01-17 MED ORDER — PREGABALIN 75 MG PO CAPS
75.0000 mg | ORAL_CAPSULE | Freq: Two times a day (BID) | ORAL | Status: DC
  Administered 2018-01-18 – 2018-01-19 (×4): 75 mg via ORAL
  Filled 2018-01-17 (×3): qty 1
  Filled 2018-01-17: qty 3

## 2018-01-17 MED ORDER — DIVALPROEX SODIUM 500 MG PO DR TAB
1000.0000 mg | DELAYED_RELEASE_TABLET | Freq: Every day | ORAL | Status: DC
  Administered 2018-01-18 – 2018-01-21 (×5): 1000 mg via ORAL
  Filled 2018-01-17 (×3): qty 2
  Filled 2018-01-17: qty 4
  Filled 2018-01-17: qty 2
  Filled 2018-01-17 (×2): qty 4
  Filled 2018-01-17: qty 2

## 2018-01-17 MED ORDER — ONDANSETRON HCL 4 MG/2ML IJ SOLN
4.0000 mg | Freq: Four times a day (QID) | INTRAMUSCULAR | Status: DC | PRN
  Administered 2018-01-18 – 2018-01-21 (×6): 4 mg via INTRAVENOUS
  Filled 2018-01-17 (×6): qty 2

## 2018-01-17 MED ORDER — OXYCODONE-ACETAMINOPHEN 5-325 MG PO TABS
1.0000 | ORAL_TABLET | Freq: Three times a day (TID) | ORAL | Status: DC | PRN
  Administered 2018-01-18 – 2018-01-21 (×6): 1 via ORAL
  Filled 2018-01-17 (×6): qty 1

## 2018-01-17 MED ORDER — ATORVASTATIN CALCIUM 80 MG PO TABS
80.0000 mg | ORAL_TABLET | Freq: Every day | ORAL | Status: DC
  Administered 2018-01-18 – 2018-01-21 (×5): 80 mg via ORAL
  Filled 2018-01-17 (×5): qty 1

## 2018-01-17 MED ORDER — IPRATROPIUM BROMIDE 0.02 % IN SOLN
0.5000 mg | Freq: Once | RESPIRATORY_TRACT | Status: AC
  Administered 2018-01-17: 0.5 mg via RESPIRATORY_TRACT
  Filled 2018-01-17: qty 2.5

## 2018-01-17 NOTE — H&P (Signed)
History and Physical    Andrea [name] ZOX:[id number] DOB: 09-01-1950 DOA: 01/17/2018  PCP: Medicine, [facility]  Patient coming from: Home  I have personally briefly reviewed patient's old medical records in [place]  Chief Complaint: CP  HPI: Andrea [name] is a 67 y.o. female with medical history significant of DM2, HTN.  Patient was admitted to hospital with CP / CHF symptoms earlier this month.  Found to have EF 30-35%, severe 3 vessel dz on Cath 12/9.  Evaluated by CVTS but not felt to be great candidate for CABG.  Underwent 2X DES to prox and distal LAD on 12/10.  CP improved some though she states SOB has persisted.  Looks like she was also put on spironolactone, lasix, and entresto.  Seen again in hospital 12/15-12/16 for CP, down trending trop.  Returns to ED today with ongoing SOB and more CP.  CP is L sided, radiates to L arm and Jaw.  Does have some mild cough.  No wheezing.  Does have DOE.  No peripheral edema.  No fever.   ED Course: CXR neg, trop neg, EKG without ischemic changes.   Review of Systems: As per HPI otherwise 10 point review of systems negative.   Past Medical History:  Diagnosis Date  . Breast cancer ([place])    bilat mastectomy and LUE lymphadenectomy 2014, chemoRx 2014 - 15  . Diabetes mellitus without complication ([place])   . Esophageal reflux   . Hypertension   . Hypothyroid   . Migraine   . Seizure [facility])     Past Surgical History:  Procedure Laterality Date  . ABDOMINAL HYSTERECTOMY     1984 , done for ruptured cyst and endometriosis  . APPENDECTOMY    . CHOLECYSTECTOMY     1980's  . CORONARY STENT INTERVENTION N/A 12/30/2017   Procedure: CORONARY STENT INTERVENTION;  Surgeon: Martinique, Peter M, MD;  Location: [place] [facility];  Service: Cardiovascular;  Laterality: N/A;  . EYE SURGERY  08/28/2015   Right eye  . KNEE SURGERY Left   . MASTECTOMY     bilateral  . Open surgery for bowel obstruction,  2000's    . RIGHT/LEFT HEART CATH AND CORONARY ANGIOGRAPHY N/A 12/29/2017   Procedure: RIGHT/LEFT HEART CATH AND CORONARY ANGIOGRAPHY;  Surgeon: Martinique, Peter M, MD;  Location: [place] [facility];  Service: Cardiovascular;  Laterality: N/A;     reports that she has never smoked. She has never used smokeless tobacco. She reports that she does not drink alcohol or use drugs.  Allergies  Allergen Reactions  . Aspirin Nausea And Vomiting  . Maxzide [Hydrochlorothiazide W-Triamterene] Swelling    Fluid retention  . Metformin And Related Diarrhea  . Nsaids Nausea And Vomiting  . Other Other (See Comments)    All steroids produce psychosis per pt Artificial sweeteners produce nausea and upset stomach.  Gregary Cromer [Pravastatin] Other (See Comments)    unknown  . Stadol [Butorphanol] Other (See Comments)    Toradol, etc And related- hallucinations  . Toradol [Ketorolac Tromethamine] Other (See Comments)    hallucinations  . Vistaril [Hydroxyzine Hcl] Other (See Comments)    unknown  . Erythromycin Itching and Rash  . Morphine And Related Hives and Rash    Iv morphine.  . Penicillins Hives, Itching and Rash    Has patient had a PCN reaction causing immediate rash, facial/tongue/throat swelling, SOB or lightheadedness with hypotension: yes Has patient had a PCN reaction causing severe rash involving mucus  membranes or skin necrosis: unknown Has patient had a PCN reaction that required hospitalization : unknown Has patient had a PCN reaction occurring within the last 10 years: no If all of the above answers are "NO", then may proceed with Cephalosporin use.     Family History  Problem Relation Age of Onset  . Sudden Cardiac Death Neg Hx      Prior to Admission medications   Medication Sig Start Date End Date Taking? Authorizing Provider  ALPRAZolam Duanne Moron) 0.5 MG tablet Take 0.5 mg by mouth 4 (four) times daily as needed for anxiety.  11/21/16   [provider]  atorvastatin  (LIPITOR) 80 MG tablet Take 1 tablet (80 mg total) by mouth at bedtime. 01/02/18   Barrett, Evelene Croon, PA-C  carvedilol (COREG) 6.25 MG tablet Take 1 tablet (6.25 mg total) by mouth 2 (two) times daily with a meal. 01/02/18   Barrett, Evelene Croon, PA-C  divalproex (DEPAKOTE) 250 MG DR tablet Take 1,000 mg by mouth at bedtime.     [provider]  ELDERBERRY PO Take 1 Dose by mouth daily.    [provider]  furosemide (LASIX) 40 MG tablet Take 1 tablet (40 mg total) by mouth daily. 01/08/18   Erlene Quan, PA-C  LANTUS SOLOSTAR 100 UNIT/ML Solostar Pen Inject 20-50 Units into the skin daily. Take based on blood sugar 03/20/17   [provider]  levothyroxine (SYNTHROID, LEVOTHROID) 200 MCG tablet Take 1 tablet (200 mcg total) by mouth at bedtime. Patient taking differently: Take 200 mcg by mouth daily before breakfast.  01/12/17   Lavina Hamman, MD  [facility] ER 30 MG T12A Take 1 tablet by mouth every 12 (twelve) hours. 12/15/17   [provider]  oxyCODONE-acetaminophen (PERCOCET) 10-325 MG tablet Take 1 tablet by mouth 3 (three) times daily as needed for pain. 02/03/17   [provider]  pregabalin (LYRICA) 75 MG capsule Take 1 capsule (75 mg total) 2 (two) times daily by mouth. 11/24/16   Benito Mccreedy, MD  promethazine (PHENERGAN) 25 MG tablet Take 25 mg by mouth every 6 (six) hours as needed for nausea.  05/30/17   [provider]  sacubitril-valsartan (ENTRESTO) 24-26 MG Take 1 tablet by mouth 2 (two) times daily. 01/02/18   Barrett, Evelene Croon, PA-C  spironolactone (ALDACTONE) 25 MG tablet Take 0.5 tablets (12.5 mg total) by mouth daily. 01/02/18   Barrett, Evelene Croon, PA-C  ticagrelor (BRILINTA) 90 MG TABS tablet Take 1 tablet (90 mg total) by mouth 2 (two) times daily. 01/02/18   Barrett, Evelene Croon, PA-C    Physical Exam: Vitals:   01/17/18 1945 01/17/18 2000 01/17/18 2030 01/17/18 2100  BP:   (!) 158/61 (!) 152/58  Pulse:   85 83    Resp:   19 (!) 21  Temp:      TempSrc:      SpO2: 97% 99% 98% 97%    Constitutional: NAD, calm, comfortable Eyes: PERRL, lids and conjunctivae normal ENMT: Mucous membranes are moist. Posterior pharynx clear of any exudate or lesions.Normal dentition.  Neck: normal, supple, no masses, no thyromegaly Respiratory: clear to auscultation bilaterally, no wheezing, no crackles. Normal respiratory effort. No accessory muscle use.  Cardiovascular: Regular rate and rhythm, no murmurs / rubs / gallops. No extremity edema. 2+ pedal pulses. No carotid bruits.  Abdomen: no tenderness, no masses palpated. No hepatosplenomegaly. Bowel sounds positive.  Musculoskeletal: no clubbing / cyanosis. No joint deformity upper and lower extremities. Good ROM,  no contractures. Normal muscle tone.  Skin: no rashes, lesions, ulcers. No induration Neurologic: CN 2-12 grossly intact. Sensation intact, DTR normal. Strength 5/5 in all 4.  Psychiatric: Normal judgment and insight. Alert and oriented x 3. Normal mood.    Labs on Admission: I have personally reviewed following labs and imaging studies  CBC: Recent Labs  Lab 01/17/18 1718  WBC 11.9*  HGB 11.3*  HCT 33.8*  MCV 90.4  PLT 401   Basic Metabolic Panel: Recent Labs  Lab 01/17/18 1718  NA 138  K 4.7  CL 97*  CO2 28  GLUCOSE 226*  BUN 33*  CREATININE 1.73*  CALCIUM 9.2   GFR: Estimated Creatinine Clearance: 31.6 mL/min (A) (by C-G formula based on SCr of 1.73 mg/dL (H)). Liver Function Tests: No results for input(s): AST, ALT, ALKPHOS, BILITOT, PROT, ALBUMIN in the last 168 hours. No results for input(s): LIPASE, AMYLASE in the last 168 hours. No results for input(s): AMMONIA in the last 168 hours. Coagulation Profile: No results for input(s): INR, PROTIME in the last 168 hours. Cardiac Enzymes: No results for input(s): CKTOTAL, CKMB, CKMBINDEX, TROPONINI in the last 168 hours. BNP (last 3 results) No results for input(s): PROBNP in the  last 8760 hours. HbA1C: No results for input(s): HGBA1C in the last 72 hours. CBG: No results for input(s): GLUCAP in the last 168 hours. Lipid Profile: No results for input(s): CHOL, HDL, LDLCALC, TRIG, CHOLHDL, LDLDIRECT in the last 72 hours. Thyroid Function Tests: No results for input(s): TSH, T4TOTAL, FREET4, T3FREE, THYROIDAB in the last 72 hours. Anemia Panel: No results for input(s): VITAMINB12, FOLATE, FERRITIN, TIBC, IRON, RETICCTPCT in the last 72 hours. Urine analysis:    Component Value Date/Time   COLORURINE YELLOW 06/01/2017 1108   APPEARANCEUR HAZY (A) 06/01/2017 1108   LABSPEC 1.028 06/01/2017 1108   PHURINE 5.0 06/01/2017 1108   GLUCOSEU >=500 (A) 06/01/2017 1108   HGBUR MODERATE (A) 06/01/2017 1108   BILIRUBINUR NEGATIVE 06/01/2017 1108   KETONESUR 5 (A) 06/01/2017 1108   PROTEINUR >=300 (A) 06/01/2017 1108   UROBILINOGEN 1.0 12/02/2013 1612   NITRITE NEGATIVE 06/01/2017 1108   LEUKOCYTESUR NEGATIVE 06/01/2017 1108    Radiological Exams on Admission: Ct Angio Chest Pe W And/or Wo Contrast  Result Date: 01/17/2018 CLINICAL DATA:  Chest pain and dizziness.  Shortness of breath. EXAM: CT ANGIOGRAPHY CHEST WITH CONTRAST TECHNIQUE: Multidetector CT imaging of the chest was performed using the standard protocol during bolus administration of intravenous contrast. Multiplanar CT image reconstructions and MIPs were obtained to evaluate the vascular anatomy. CONTRAST:  162mL ISOVUE-370 IOPAMIDOL (ISOVUE-370) INJECTION 76% COMPARISON:  Chest x-ray January 17, 2018 FINDINGS: Cardiovascular: Satisfactory opacification of the pulmonary arteries to the segmental level. No evidence of pulmonary embolism. Normal heart size. No pericardial effusion. Mediastinum/Nodes: No enlarged mediastinal, hilar, or axillary lymph nodes. Thyroid gland, trachea, and esophagus demonstrate no significant findings. Lungs/Pleura: Mild atelectasis of the posterior lung bases are noted. There is no  pleural effusion, pneumothorax or focal pneumonia. Upper Abdomen: Patient status post prior cholecystectomy. The other visualized upper abdominal structures are unremarkable. Musculoskeletal: Mild degenerative joint changes of the spine are noted. Review of the MIP images confirms the above findings. IMPRESSION: No pulmonary embolus. No focal pneumonia. Mild atelectasis of bilateral posterior lung bases. Electronically Signed   By: Abelardo Diesel M.D.   On: 01/17/2018 20:42   Dg Chest Port 1 View  Result Date: 01/17/2018 CLINICAL DATA:  Left-sided and central chest pain x2 weeks. EXAM:  PORTABLE CHEST 1 VIEW COMPARISON:  01/04/2018 FINDINGS: The heart size and mediastinal contours are within normal limits. Both lungs are clear. The visualized skeletal structures are unremarkable. IMPRESSION: No active disease. Electronically Signed   By: Ashley Royalty M.D.   On: 01/17/2018 18:28    EKG: Independently reviewed.  Assessment/Plan Principal Problem:   Chest pain, rule out acute myocardial infarction Active Problems:   Hypertension   Insulin dependent diabetes mellitus (HCC)   Chronic pain   AKI (acute kidney injury) ([place])   Chronic systolic heart failure (HCC)    1. CP - 1. CP obs pathway 2. Cards evaluating 3. Serial trops 4. Tele monitor 5. Letting patient eat for the moment while cards evaluates 6. Continue Brilinta 2. HTN - 1. Holding entresto 2. Continue coreg 3. AKI - 1. Hold Entresto 2. Hold diuretics 3. Repeat BMP in AM 4. DM2 - 1. SSI moderate scale AC/HS 2. Switch to sensitive scale Q4H if she is made NPO 5. Chronic pain - continue home narcotics and lyrica  DVT prophylaxis: Lovenox Code Status: Full Family Communication: No family in room Disposition Plan: Home after admit Consults called: Cards to see patient in ED tonight Admission status: Place in obs  [place], [place] [facility] Pager [phone] Only works nights!  If 7AM-7PM, please contact the  primary day team physician taking care of patient  [email or web address] Password [email or web address]  01/17/2018, 10:10 PM

## 2018-01-17 NOTE — ED Provider Notes (Addendum)
[facility] EMERGENCY DEPARTMENT Provider Note   CSN: [id number] Arrival date & time: 01/17/18  1650     History   Chief Complaint Chief Complaint  Patient presents with  . Chest Pain    HPI Andrea [name] is a 67 y.o. female.  HPI  Patient is a 67 year old female with a past medical history of CAD status post 2 DES stents to her LAD this month on the 10th who presents for evaluation of acute shortness of breath and chest pain that began around noon today.  Patient states she has been compliant with her ASA and Brilinta.  She states chest pain radiates to her left arm and jaw.  She states it feels similar to how her prior chest pain felt when she was diagnosed with an MI.  She denies any other recent acute symptoms including headache, fever, vomiting, diarrhea, dysuria, belly pain, acute back pain, focal extremity pain, rash, or other acute complaints.  Denies any alleviating or aggravating factors including positional or exertional factors.  States she did take 2 nitros prior to arrival that only minimally improved her pain.  Past Medical History:  Diagnosis Date  . Breast cancer ([place])    bilat mastectomy and LUE lymphadenectomy 2014, chemoRx 2014 - 15  . Diabetes mellitus without complication ([place])   . Esophageal reflux   . Hypertension   . Hypothyroid   . Migraine   . Seizure [facility])     Patient Active Problem List   Diagnosis Date Noted  . Chest pain, rule out acute myocardial infarction 01/17/2018  . Cardiomyopathy ([place]) 01/08/2018  . Drug allergy, multiple 01/08/2018  . History of breast cancer 01/08/2018  . Chronic systolic heart failure ([place])   . Ischemic chest pain ([place]) 01/04/2018  . Chest pain 01/04/2018  . CAD S/P percutaneous coronary angioplasty 12/30/2017  . Acute CHF (congestive heart failure) ([place]) 12/26/2017  . Unspecified abdominal pain 06/02/2017  . Hyperglycemia 06/01/2017  . C. difficile colitis 03/17/2017  . AKI (acute  kidney injury) ([place])   . Diverticulitis 02/17/2017  . Frequent falls 01/08/2017  . Rhabdomyolysis 01/08/2017  . Chronic pain 01/08/2017  . Intractable nausea and vomiting 11/23/2016  . Diarrhea 11/23/2016  . UTI (urinary tract infection) 12/03/2013  . Hypertension   . Insulin dependent diabetes mellitus ([place])   . Migraine   . Hypothyroid   . History of seizures   . Esophageal reflux   . Multiple rib fractures 11/28/2013    Past Surgical History:  Procedure Laterality Date  . ABDOMINAL HYSTERECTOMY     1984 , done for ruptured cyst and endometriosis  . APPENDECTOMY    . CHOLECYSTECTOMY     1980's  . CORONARY STENT INTERVENTION N/A 12/30/2017   Procedure: CORONARY STENT INTERVENTION;  Surgeon: Martinique, Peter M, MD;  Location: [facility] CV LAB;  Service: Cardiovascular;  Laterality: N/A;  . EYE SURGERY  08/28/2015   Right eye  . KNEE SURGERY Left   . MASTECTOMY     bilateral  . Open surgery for bowel obstruction, 2000's    . RIGHT/LEFT HEART CATH AND CORONARY ANGIOGRAPHY N/A 12/29/2017   Procedure: RIGHT/LEFT HEART CATH AND CORONARY ANGIOGRAPHY;  Surgeon: Martinique, Peter M, MD;  Location: [facility] CV LAB;  Service: Cardiovascular;  Laterality: N/A;     OB History   No obstetric history on file.      Home Medications    Prior to Admission medications   Medication Sig Start Date End Date Taking?  Authorizing Provider  ALPRAZolam Duanne Moron) 0.5 MG tablet Take 0.5 mg by mouth 4 (four) times daily as needed for anxiety.  11/21/16  Yes [provider]  atorvastatin (LIPITOR) 80 MG tablet Take 1 tablet (80 mg total) by mouth at bedtime. 01/02/18  Yes Barrett, Evelene Croon, PA-C  carvedilol (COREG) 6.25 MG tablet Take 1 tablet (6.25 mg total) by mouth 2 (two) times daily with a meal. 01/02/18  Yes Barrett, Evelene Croon, PA-C  divalproex (DEPAKOTE) 250 MG DR tablet Take 1,000 mg by mouth at bedtime.    Yes [provider]  ELDERBERRY PO Take 1 Dose by mouth daily.   Yes  [provider]  furosemide (LASIX) 40 MG tablet Take 1 tablet (40 mg total) by mouth daily. 01/08/18  Yes Kilroy, Luke K, PA-C  LANTUS SOLOSTAR 100 UNIT/ML Solostar Pen Inject 20-50 Units into the skin daily. Take based on blood sugar 03/20/17  Yes [provider]  levothyroxine (SYNTHROID, LEVOTHROID) 200 MCG tablet Take 1 tablet (200 mcg total) by mouth at bedtime. Patient taking differently: Take 200 mcg by mouth daily before breakfast.  01/12/17  Yes Lavina Hamman, MD  [facility] ER 30 MG T12A Take 1 tablet by mouth every 12 (twelve) hours. 12/15/17  Yes [provider]  oxyCODONE-acetaminophen (PERCOCET) 10-325 MG tablet Take 1 tablet by mouth 3 (three) times daily as needed for pain. 02/03/17  Yes [provider]  pregabalin (LYRICA) 75 MG capsule Take 1 capsule (75 mg total) 2 (two) times daily by mouth. 11/24/16  Yes Osei-Bonsu, Iona Beard, MD  promethazine (PHENERGAN) 25 MG tablet Take 25 mg by mouth every 6 (six) hours as needed for nausea.  05/30/17  Yes [provider]  sacubitril-valsartan (ENTRESTO) 24-26 MG Take 1 tablet by mouth 2 (two) times daily. 01/02/18  Yes Barrett, Evelene Croon, PA-C  spironolactone (ALDACTONE) 25 MG tablet Take 0.5 tablets (12.5 mg total) by mouth daily. 01/02/18  Yes Barrett, Evelene Croon, PA-C  ticagrelor (BRILINTA) 90 MG TABS tablet Take 1 tablet (90 mg total) by mouth 2 (two) times daily. 01/02/18  Yes Barrett, Evelene Croon, PA-C    Family History Family History  Problem Relation Age of Onset  . Sudden Cardiac Death Neg Hx     Social History Social History   Tobacco Use  . Smoking status: Never Smoker  . Smokeless tobacco: Never Used  Substance Use Topics  . Alcohol use: No  . Drug use: No     Allergies   Aspirin; Maxzide [hydrochlorothiazide w-triamterene]; Metformin and related; Nsaids; Other; Pravachol [pravastatin]; Stadol [butorphanol]; Toradol [ketorolac tromethamine]; Vistaril [hydroxyzine hcl];  Erythromycin; Morphine and related; and Penicillins   Review of Systems Review of Systems  Constitutional: Negative for chills and fever.  HENT: Negative for ear pain and sore throat.   Eyes: Negative for pain and visual disturbance.  Respiratory: Positive for shortness of breath. Negative for cough.   Cardiovascular: Positive for chest pain. Negative for palpitations.  Gastrointestinal: Negative for abdominal pain and vomiting.  Genitourinary: Negative for dysuria and hematuria.  Musculoskeletal: Negative for arthralgias and back pain.  Skin: Negative for color change and rash.  Neurological: Negative for seizures and syncope.  All other systems reviewed and are negative.    Physical Exam Updated Vital Signs BP (!) 152/58   Pulse 83   Temp 99 F (37.2 C) (Oral)   Resp 14   SpO2 98%   Physical Exam Vitals signs and nursing note reviewed.  Constitutional:  General: She is not in acute distress.    Appearance: She is well-developed.  HENT:     Head: Normocephalic and atraumatic.  Eyes:     Conjunctiva/sclera: Conjunctivae normal.  Neck:     Musculoskeletal: Neck supple.  Cardiovascular:     Rate and Rhythm: Normal rate and regular rhythm.     Pulses:          Radial pulses are 2+ on the right side and 2+ on the left side.     Heart sounds: No murmur.  Pulmonary:     Effort: Pulmonary effort is normal. No respiratory distress.     Breath sounds: Normal breath sounds.  Abdominal:     Palpations: Abdomen is soft.     Tenderness: There is no abdominal tenderness.  Skin:    General: Skin is warm and dry.     Capillary Refill: Capillary refill takes less than 2 seconds.  Neurological:     General: No focal deficit present.     Mental Status: She is alert.      ED Treatments / Results  Labs (all labs ordered are listed, but only abnormal results are displayed) Labs Reviewed  BASIC METABOLIC PANEL - Abnormal; Notable for the following components:      Result  Value   Chloride 97 (*)    Glucose, Bld 226 (*)    BUN 33 (*)    Creatinine, Ser 1.73 (*)    GFR calc non Af Amer 30 (*)    GFR calc Af Amer 35 (*)    All other components within normal limits  CBC - Abnormal; Notable for the following components:   WBC 11.9 (*)    RBC 3.74 (*)    Hemoglobin 11.3 (*)    HCT 33.8 (*)    All other components within normal limits  BRAIN NATRIURETIC PEPTIDE - Abnormal; Notable for the following components:   B Natriuretic Peptide 267.6 (*)    All other components within normal limits  TROPONIN I  TROPONIN I  TROPONIN I  BASIC METABOLIC PANEL  I-STAT TROPONIN, ED    EKG EKG Interpretation  Date/Time:  Saturday January 17 2018 17:02:39 EST Ventricular Rate:  81 PR Interval:    QRS Duration: 80 QT Interval:  385 QTC Calculation: 447 R Axis:   27 Text Interpretation:  Sinus rhythm Baseline wander in lead(s) V6 No significant change since last tracing Confirmed by Wandra Arthurs [phone]) on 01/17/2018 5:06:48 PM   Radiology Ct Angio Chest Pe W And/or Wo Contrast  Result Date: 01/17/2018 CLINICAL DATA:  Chest pain and dizziness.  Shortness of breath. EXAM: CT ANGIOGRAPHY CHEST WITH CONTRAST TECHNIQUE: Multidetector CT imaging of the chest was performed using the standard protocol during bolus administration of intravenous contrast. Multiplanar CT image reconstructions and MIPs were obtained to evaluate the vascular anatomy. CONTRAST:  130mL ISOVUE-370 IOPAMIDOL (ISOVUE-370) INJECTION 76% COMPARISON:  Chest x-ray January 17, 2018 FINDINGS: Cardiovascular: Satisfactory opacification of the pulmonary arteries to the segmental level. No evidence of pulmonary embolism. Normal heart size. No pericardial effusion. Mediastinum/Nodes: No enlarged mediastinal, hilar, or axillary lymph nodes. Thyroid gland, trachea, and esophagus demonstrate no significant findings. Lungs/Pleura: Mild atelectasis of the posterior lung bases are noted. There is no pleural effusion,  pneumothorax or focal pneumonia. Upper Abdomen: Patient status post prior cholecystectomy. The other visualized upper abdominal structures are unremarkable. Musculoskeletal: Mild degenerative joint changes of the spine are noted. Review of the MIP images confirms the above findings. IMPRESSION:  No pulmonary embolus. No focal pneumonia. Mild atelectasis of bilateral posterior lung bases. Electronically Signed   By: Abelardo Diesel M.D.   On: 01/17/2018 20:42   Dg Chest Port 1 View  Result Date: 01/17/2018 CLINICAL DATA:  Left-sided and central chest pain x2 weeks. EXAM: PORTABLE CHEST 1 VIEW COMPARISON:  01/04/2018 FINDINGS: The heart size and mediastinal contours are within normal limits. Both lungs are clear. The visualized skeletal structures are unremarkable. IMPRESSION: No active disease. Electronically Signed   By: Ashley Royalty M.D.   On: 01/17/2018 18:28    Procedures Procedures (including critical care time)  Medications Ordered in ED Medications  iopamidol (ISOVUE-370) 76 % injection (has no administration in time range)  acetaminophen (TYLENOL) tablet 650 mg (has no administration in time range)  ondansetron (ZOFRAN) injection 4 mg (has no administration in time range)  enoxaparin (LOVENOX) injection 40 mg (has no administration in time range)  carvedilol (COREG) tablet 6.25 mg (has no administration in time range)  atorvastatin (LIPITOR) tablet 80 mg (has no administration in time range)  ALPRAZolam (XANAX) tablet 0.5 mg (has no administration in time range)  divalproex (DEPAKOTE) DR tablet 1,000 mg (has no administration in time range)  levothyroxine (SYNTHROID, LEVOTHROID) tablet 200 mcg (has no administration in time range)  ticagrelor (BRILINTA) tablet 90 mg (has no administration in time range)  pregabalin (LYRICA) capsule 75 mg (has no administration in time range)  promethazine (PHENERGAN) tablet 25 mg (has no administration in time range)  Morphine Sulfate ER T12A 1 tablet  (has no administration in time range)  insulin aspart (novoLOG) injection 0-15 Units (has no administration in time range)  insulin aspart (novoLOG) injection 0-5 Units (has no administration in time range)  oxyCODONE-acetaminophen (PERCOCET/ROXICET) 5-325 MG per tablet 1 tablet (has no administration in time range)    And  oxyCODONE (Oxy IR/ROXICODONE) immediate release tablet 5 mg (has no administration in time range)  albuterol (PROVENTIL) (2.5 MG/3ML) 0.083% nebulizer solution 5 mg (5 mg Nebulization Given 01/17/18 1817)  ipratropium (ATROVENT) nebulizer solution 0.5 mg (0.5 mg Nebulization Given 01/17/18 1817)  morphine 4 MG/ML injection 4 mg (4 mg Intravenous Given 01/17/18 1828)  sodium chloride 0.9 % bolus 1,000 mL (1,000 mLs Intravenous New Bag/Given 01/17/18 2228)  iopamidol (ISOVUE-370) 76 % injection 75 mL (100 mLs Intravenous Contrast Given 01/17/18 1953)     Initial Impression / Assessment and Plan / ED Course  I have reviewed the triage vital signs and the nursing notes.  Pertinent labs & imaging results that were available during my care of the patient were reviewed by me and considered in my medical decision making (see chart for details).     Patient is a 67 year old female who presents above-stated history exam.  On presentation patient is afebrile stable vital signs.  Exam as above.  History exam is not consistent with aortic dissection, pericarditis, myocarditis.  ECG shows a ventricular rate of 81 with normal intervals, no signs of acute ischemic change, and apparently a significantly changed from prior.  In addition troponin is WNL.  However given patient's recent cardiac history including 2 drug-eluting stents for severe three-vessel disease there is suspicion for unstable angina.  Patient refused aspirin on arrival.  Doubt PE or pneumonia or pneumothorax given CTA chest does not show any findings consistent with these pathologies.  Doubt CHF exacerbation given absence of  pulmonary edema on chest imaging and BNP 267.  Doubt symptomatic anemia given hemoglobin of 11.3 but appears elevated from baseline.  Patient is noted to have an AKI on her BMP which shows a creatinine of 1.73 which is elevated compared to baseline which appears to be 1.1.  Patient given IV fluids in the ED.  Cardiology service consulted.  They did not have any acute recommendations.  Patient made in stable condition to hospitalist service for high risk chest pain.  Final Clinical Impressions(s) / ED Diagnoses   Final diagnoses:  Chest pain, unspecified type  AKI (acute kidney injury) [place])    ED Discharge Orders    None       Hulan Saas, MD 01/17/18 0240    Hulan Saas, MD 01/17/18 2319    Drenda Freeze, MD 01/18/18 1744

## 2018-01-17 NOTE — ED Notes (Signed)
ED Provider at bedside. 

## 2018-01-17 NOTE — ED Notes (Signed)
Delay in lab draw,  Pt currently in the bathroom.

## 2018-01-17 NOTE — ED Triage Notes (Signed)
Pt arrived [facility] from home for new onset CP, dizziness, jaw pain, left shoulder pain. 2 stents placed on 12/10, new dx of CHF. No active chest pain at this time.   BP 130/71 P 82 O2 97% RR 20

## 2018-01-17 NOTE — Consult Note (Signed)
Cardiology Consultation:   Patient ID: Andrea [name] MRN: [id number]; DOB: 07-27-1950  Admit date: 01/17/2018 Date of Consult: 01/18/2018  Primary Care Provider: Medicine, [facility] Primary Cardiologist: Kirk Ruths, MD    Patient Profile:   Andrea [name] is a 67 y.o. female with a recent hospitalization for chest pain with workup notable for new HFrEF (EF 30-35%) and triple vessel CAD for which she underwent multiple PCI to LAD, who presents with recurrent chest pain who is being seen today for the evaluation of chest pain at the request of Dr. Tamala Julian.  History of Present Illness:   Andrea [name] is a 67 y.o. female with a recent hospitalization for chest pain with workup notable for new HFrEF (EF 30-35%) and triple vessel CAD for which she underwent multiple PCI to LAD, who presents with recurrent chest pain who is being seen today for the evaluation of chest pain at the request of Dr. Tamala Julian.  The patient was hospitalize earlier this month with chest pain and dyspnea. Echocardiogram 12/26/2017 showed new LV dysfunction with EF of 30 to 35%. She underwent LHC that showed triple vessel CAD. CT surgery was consulted, and the decision was made for PCI over CABG, and she received DES x2 to the LAD. She has residual 90% diagonal and 80% OM1 stenoses.  She was discharged 12/31/2017 with ticagrelor 90 BID due to ASA allergy. and then readmitted 12/15 with recurrent chest pain and dyspnea. She was evaluated by cardiology and reported that she had not taken her medications since discharge. Her troponin was 0.27 with subsequent downtrend.   She was most recently seen in cardiology clinic on 01/08/18. At that visit, she reported multiple complaints including DOE, orthopnea, and dizziness. Her furosemide was increased, and a plan was made for follow up in one week.   She presents back to the ED this evening with recurrent chest pain and dyspnea. In the ED, SBP  150-170s, HR 80s. ECG showed NSR with borderline septal Q and nonspecific ST changes. Troponin negative x2 and BNP 268, down from last measurement of 333. Cr elevated at 1.73 (recent baseline ~1.1). CXR unremarkable, and she underwent CTA that showed no PE. She was admitted to the hospitalist service, and cardiology was consulted for further recommendations.   On my evaluation the patient is tearful and distressed. She reports that she has been having ongoing, progressive dyspnea with exertion. She has also had chest pressure with exertion, although this is not significantly different than her recent baseline. She states that she has been taking all of her medications as prescribed, including the increased dose of her diuretic. She states that she has lost 30 lbs of weight over the past month and about 10 lbs since her recent clinic visit. She states that her LE edema has resolved, and she endorses some lightheadedness with positional changes.   Past Medical History:  Diagnosis Date  . Breast cancer ([place])    bilat mastectomy and LUE lymphadenectomy 2014, chemoRx 2014 - 15  . Diabetes mellitus without complication ([place])   . Esophageal reflux   . Hypertension   . Hypothyroid   . Migraine   . Seizure [facility])     Past Surgical History:  Procedure Laterality Date  . ABDOMINAL HYSTERECTOMY     1984 , done for ruptured cyst and endometriosis  . APPENDECTOMY    . CHOLECYSTECTOMY     1980's  . CORONARY STENT INTERVENTION N/A 12/30/2017   Procedure: CORONARY STENT INTERVENTION;  Surgeon: Martinique, Peter  M, MD;  Location: [facility];  Service: Cardiovascular;  Laterality: N/A;  . EYE SURGERY  08/28/2015   Right eye  . KNEE SURGERY Left   . MASTECTOMY     bilateral  . Open surgery for bowel obstruction, 2000's    . RIGHT/LEFT HEART CATH AND CORONARY ANGIOGRAPHY N/A 12/29/2017   Procedure: RIGHT/LEFT HEART CATH AND CORONARY ANGIOGRAPHY;  Surgeon: Martinique, Peter M, MD;  Location: [facility];  Service: Cardiovascular;  Laterality: N/A;     Home Medications:  Prior to Admission medications   Medication Sig Start Date End Date Taking? Authorizing Provider  ALPRAZolam Duanne Moron) 0.5 MG tablet Take 0.5 mg by mouth 4 (four) times daily as needed for anxiety.  11/21/16   [provider]  atorvastatin (LIPITOR) 80 MG tablet Take 1 tablet (80 mg total) by mouth at bedtime. 01/02/18   Barrett, Evelene Croon, PA-C  carvedilol (COREG) 6.25 MG tablet Take 1 tablet (6.25 mg total) by mouth 2 (two) times daily with a meal. 01/02/18   Barrett, Evelene Croon, PA-C  divalproex (DEPAKOTE) 250 MG DR tablet Take 1,000 mg by mouth at bedtime.     [provider]  ELDERBERRY PO Take 1 Dose by mouth daily.    [provider]  furosemide (LASIX) 40 MG tablet Take 1 tablet (40 mg total) by mouth daily. 01/08/18   Erlene Quan, PA-C  LANTUS SOLOSTAR 100 UNIT/ML Solostar Pen Inject 20-50 Units into the skin daily. Take based on blood sugar 03/20/17   [provider]  levothyroxine (SYNTHROID, LEVOTHROID) 200 MCG tablet Take 1 tablet (200 mcg total) by mouth at bedtime. Patient taking differently: Take 200 mcg by mouth daily before breakfast.  01/12/17   Lavina Hamman, MD  [facility] ER 30 MG T12A Take 1 tablet by mouth every 12 (twelve) hours. 12/15/17   [provider]  oxyCODONE-acetaminophen (PERCOCET) 10-325 MG tablet Take 1 tablet by mouth 3 (three) times daily as needed for pain. 02/03/17   [provider]  pregabalin (LYRICA) 75 MG capsule Take 1 capsule (75 mg total) 2 (two) times daily by mouth. 11/24/16   Benito Mccreedy, MD  promethazine (PHENERGAN) 25 MG tablet Take 25 mg by mouth every 6 (six) hours as needed for nausea.  05/30/17   [provider]  sacubitril-valsartan (ENTRESTO) 24-26 MG Take 1 tablet by mouth 2 (two) times daily. 01/02/18   Barrett, Evelene Croon, PA-C  spironolactone (ALDACTONE) 25 MG tablet Take 0.5 tablets (12.5 mg total) by  mouth daily. 01/02/18   Barrett, Evelene Croon, PA-C  ticagrelor (BRILINTA) 90 MG TABS tablet Take 1 tablet (90 mg total) by mouth 2 (two) times daily. 01/02/18   Barrett, Evelene Croon, PA-C    Inpatient Medications: Scheduled Meds: . atorvastatin  80 mg Oral QHS  . carvedilol  6.25 mg Oral BID WC  . divalproex  1,000 mg Oral QHS  . enoxaparin (LOVENOX) injection  40 mg Subcutaneous QHS  . insulin aspart  0-15 Units Subcutaneous TID WC  . insulin aspart  0-5 Units Subcutaneous QHS  . iopamidol      . levothyroxine  200 mcg Oral QAC breakfast  . Morphine Sulfate ER  1 tablet Oral Q12H  . pregabalin  75 mg Oral BID  . ticagrelor  90 mg Oral BID   Continuous Infusions:  PRN Meds:   Allergies:    Allergies  Allergen Reactions  . Aspirin Nausea And Vomiting  . Maxzide [Hydrochlorothiazide W-Triamterene]  Swelling    Fluid retention  . Metformin And Related Diarrhea  . Nsaids Nausea And Vomiting  . Other Other (See Comments)    All steroids produce psychosis per pt Artificial sweeteners produce nausea and upset stomach.  Gregary Cromer [Pravastatin] Other (See Comments)    unknown  . Stadol [Butorphanol] Other (See Comments)    Toradol, etc And related- hallucinations  . Toradol [Ketorolac Tromethamine] Other (See Comments)    hallucinations  . Vistaril [Hydroxyzine Hcl] Other (See Comments)    unknown  . Erythromycin Itching and Rash  . Morphine And Related Hives and Rash    Iv morphine.  . Penicillins Hives, Itching and Rash    Has patient had a PCN reaction causing immediate rash, facial/tongue/throat swelling, SOB or lightheadedness with hypotension: yes Has patient had a PCN reaction causing severe rash involving mucus membranes or skin necrosis: unknown Has patient had a PCN reaction that required hospitalization : unknown Has patient had a PCN reaction occurring within the last 10 years: no If all of the above answers are "NO", then may proceed with Cephalosporin use.      Social History:   Social History   Socioeconomic History  . Marital status: Divorced    Spouse name: Not on file  . Number of children: Not on file  . Years of education: Not on file  . Highest education level: Not on file  Occupational History  . Not on file  Social Needs  . Financial resource strain: Not on file  . Food insecurity:    Worry: Never true    Inability: Never true  . Transportation needs:    Medical: No    Non-medical: No  Tobacco Use  . Smoking status: Never Smoker  . Smokeless tobacco: Never Used  Substance and Sexual Activity  . Alcohol use: No  . Drug use: No  . Sexual activity: Not Currently  Lifestyle  . Physical activity:    Days per week: Patient refused    Minutes per session: Patient refused  . Stress: Only a little  Relationships  . Social connections:    Talks on phone: Patient refused    Gets together: Patient refused    Attends religious service: Patient refused    Active member of club or organization: Patient refused    Attends meetings of clubs or organizations: Patient refused    Relationship status: Patient refused  . Intimate partner violence:    Fear of current or ex partner: Patient refused    Emotionally abused: Patient refused    Physically abused: Patient refused    Forced sexual activity: Patient refused  Other Topics Concern  . Not on file  Social History Narrative   NONE    Family History:   Family History  Problem Relation Age of Onset  . Sudden Cardiac Death Neg Hx      ROS:  Please see the history of present illness.  All other ROS reviewed and negative.     Physical Exam/Data:   Vitals:   01/17/18 2000 01/17/18 2030 01/17/18 2100 01/17/18 2208  BP:  (!) 158/61 (!) 152/58   Pulse:  85 83 83  Resp:  19 (!) 21 14  Temp:      TempSrc:      SpO2: 99% 98% 97% 98%   No intake or output data in the 24 hours ending 01/18/18 0038 There were no vitals filed for this visit. There is no height or weight on  file to  calculate BMI.  General:  Anxious, tearful, mildly distressed HEENT: normal Neck: JVD at base of neck Cardiac:  normal S1, S2; RRR; no murmur   Lungs:  clear to auscultation bilaterally, no wheezing, rhonchi or rales  Abd: soft, nontender Ext: no edema Musculoskeletal:  No deformities, BUE and BLE strength normal and equal Skin: warm and dry  Neuro:  No focal abnormalities noted Psych:  Anxious and tearful  EKG:  The EKG was personally reviewed and demonstrates:  NSR with borderline septal Q and nonspecific ST changes Telemetry:  Telemetry was not connected on my evaluation as pt is in hallway  Relevant CV Studies: Echo 12/26/17 Study Conclusions - Left ventricle: Apical images sub optimal due to breast implants. The cavity size was mildly dilated. Wall thickness was increased in a pattern of mild LVH. Systolic function was moderately to severely reduced. The estimated ejection fraction was in the range of 30% to 35%. Diffuse hypokinesis. Doppler parameters are consistent with both elevated ventricular end-diastolic filling pressure and elevated left atrial filling pressure. - Left atrium: The atrium was moderately dilated.  Cath 12/29/2017:  RPDA lesion is 75% stenosed.  Post Atrio lesion is 50% stenosed.  Prox LAD lesion is 90% stenosed.  Dist LAD lesion is 90% stenosed.  Ost 1st Diag to 1st Diag lesion is 90% stenosed.  Ost 1st Mrg to 1st Mrg lesion is 80% stenosed.  Prox LAD to Mid LAD lesion is 40% stenosed.  There is moderate to severe left ventricular systolic dysfunction.  LV end diastolic pressure is normal.  There is no mitral valve regurgitation.  There is no aortic valve stenosis.  1. Severe 3 vessel obstructive CAD. 2. Moderate to severe LV dysfunction. EF 30-35% 3. Normal LV filling pressures 4. Normal right heart pressures. 5. Normal cardiac output.   Plan: patient is diabetic with LV dysfunction and 3 vessel CAD.  Recommend surgical consultation for revascularization. If she is not felt to be a surgical candidate we could treat the proximal LAD +/- distal LAD with PCI. The other vessels are not suitable PCI targets.    Cath 12/30/2017:  Prox LAD lesion is 90% stenosed.  A drug-eluting stent was successfully placed using a STENT SYNERGY DES 3X38.  Post intervention, there is 0% residual stenosis.  Dist LAD lesion is 90% stenosed.  A drug-eluting stent was successfully placed using a STENT SYNERGY DES 2.5X16.  Post intervention, there is a 0% residual stenosis.  1. Successful PCI of the proximal and distal LAD with DES x 2.  Plan: since patient is ASA allergic I would recommend Brilinta monotherapy 90 mg bid for one year then 60 mg bid long term. Patient is a candidate for discharge from a cardiac standpoint tomorrow. Her residual CAD will be treated medically.    Laboratory Data:  Chemistry Recent Labs  Lab 01/17/18 1718  NA 138  K 4.7  CL 97*  CO2 28  GLUCOSE 226*  BUN 33*  CREATININE 1.73*  CALCIUM 9.2  GFRNONAA 30*  GFRAA 35*  ANIONGAP 13    No results for input(s): PROT, ALBUMIN, AST, ALT, ALKPHOS, BILITOT in the last 168 hours. Hematology Recent Labs  Lab 01/17/18 1718  WBC 11.9*  RBC 3.74*  HGB 11.3*  HCT 33.8*  MCV 90.4  MCH 30.2  MCHC 33.4  RDW 12.2  PLT 300   Cardiac Enzymes Recent Labs  Lab 01/17/18 2243  TROPONINI <0.03    Recent Labs  Lab 01/17/18 1716  TROPIPOC 0.01  BNP Recent Labs  Lab 01/17/18 1715  BNP 267.6*    DDimer No results for input(s): DDIMER in the last 168 hours.  Radiology/Studies:  Ct Angio Chest Pe W And/or Wo Contrast  Result Date: 01/17/2018 CLINICAL DATA:  Chest pain and dizziness.  Shortness of breath. EXAM: CT ANGIOGRAPHY CHEST WITH CONTRAST TECHNIQUE: Multidetector CT imaging of the chest was performed using the standard protocol during bolus administration of intravenous contrast. Multiplanar CT image  reconstructions and MIPs were obtained to evaluate the vascular anatomy. CONTRAST:  192mL ISOVUE-370 IOPAMIDOL (ISOVUE-370) INJECTION 76% COMPARISON:  Chest x-ray January 17, 2018 FINDINGS: Cardiovascular: Satisfactory opacification of the pulmonary arteries to the segmental level. No evidence of pulmonary embolism. Normal heart size. No pericardial effusion. Mediastinum/Nodes: No enlarged mediastinal, hilar, or axillary lymph nodes. Thyroid gland, trachea, and esophagus demonstrate no significant findings. Lungs/Pleura: Mild atelectasis of the posterior lung bases are noted. There is no pleural effusion, pneumothorax or focal pneumonia. Upper Abdomen: Patient status post prior cholecystectomy. The other visualized upper abdominal structures are unremarkable. Musculoskeletal: Mild degenerative joint changes of the spine are noted. Review of the MIP images confirms the above findings. IMPRESSION: No pulmonary embolus. No focal pneumonia. Mild atelectasis of bilateral posterior lung bases. Electronically Signed   By: Abelardo Diesel M.D.   On: 01/17/2018 20:42   Dg Chest Port 1 View  Result Date: 01/17/2018 CLINICAL DATA:  Left-sided and central chest pain x2 weeks. EXAM: PORTABLE CHEST 1 VIEW COMPARISON:  01/04/2018 FINDINGS: The heart size and mediastinal contours are within normal limits. Both lungs are clear. The visualized skeletal structures are unremarkable. IMPRESSION: No active disease. Electronically Signed   By: Ashley Royalty M.D.   On: 01/17/2018 18:28    Assessment and Plan:   Chest pain and dyspnea The patient has a recent diagnosis of triple vessel CAD and moderate to severe HFrEF with mixed ischemic and nonischemic etiologies. She underwent recent revascularization of LAD although she has residual severe CAD. She has had multiple subsequent hospitalizations this month with ongoing chest pain and progressive dyspnea. The etiology of her symptoms are likely multifactorial with component of  un-revascularized CAD, pump failure, as well as a probable component of anxiety. Her furosemide was recently increased with no significant improvement of her symptoms, and she likely is now dehydrated with hypovolemia and AKI. ECG and troponin are reassuring against AMI or signs of stent thrombosis. CTA ruled out PE. At this time, management of her symptoms will likely be difficult: she will need ongoing optimization of HF, and further revascularization could be considered.   Chest pain with known CAD s/p recent PCI/DES x2 to LAD: As above, the patient has a recent diagnosis of 3V CAD and had revascularization of LAD, although she has residual disease in her RCA and LCX systems. It is likely that her CAD may be contributing to her symptoms.  -Continue to trend troponin  -Continue ticagrelor 90 BID (due to ASA allergy) -Continue atorvastatin -Continue carvedilol  -We will follow along to determine what further revascularization options may be appropriate.  Chronic systolic HF/ischemic cardiomyopathy with known LVEF of 30 to 35% As above, the patient likely has mixed ischemic and non-ischemic contribution to her HFrEF. She continues to have debilitating dyspnea. It is unclear if this may be due, at least in part, to pump failure. Of note, she appears euvolemic to dry on exam and has elevated Cr, consistent with probable overdiureis; she nonetheless has no significant improvement in her symptoms. -Hold diuretic, Delene Loll,  spironolactone in the setting of AKI. She was given IVF in the ED. Would hold on further IVF until response is monitored to avoid over-compensation -Continue carvedilol  AKI As above, this likely is due to overdiuresis. Her AKI may worsen given dye load of CTA in the ED tonight. -Holding selected HF medication as above.   HTN: BP remains elevated in the ED -Continue to monitor while holding selected HF medications -Continue carvedilol    For questions or updates, please  contact [facility] Please consult [email or web address] for contact info under     Signed, Nila Nephew, MD  01/18/2018 12:38 AM

## 2018-01-18 DIAGNOSIS — R0602 Shortness of breath: Secondary | ICD-10-CM

## 2018-01-18 DIAGNOSIS — I5022 Chronic systolic (congestive) heart failure: Secondary | ICD-10-CM | POA: Diagnosis not present

## 2018-01-18 DIAGNOSIS — E7800 Pure hypercholesterolemia, unspecified: Secondary | ICD-10-CM

## 2018-01-18 DIAGNOSIS — E119 Type 2 diabetes mellitus without complications: Secondary | ICD-10-CM | POA: Diagnosis not present

## 2018-01-18 DIAGNOSIS — Z794 Long term (current) use of insulin: Secondary | ICD-10-CM

## 2018-01-18 DIAGNOSIS — R079 Chest pain, unspecified: Secondary | ICD-10-CM | POA: Diagnosis not present

## 2018-01-18 LAB — BASIC METABOLIC PANEL
Anion gap: 12 (ref 5–15)
BUN: 29 mg/dL — ABNORMAL HIGH (ref 8–23)
CO2: 28 mmol/L (ref 22–32)
Calcium: 8.4 mg/dL — ABNORMAL LOW (ref 8.9–10.3)
Chloride: 101 mmol/L (ref 98–111)
Creatinine, Ser: 1.52 mg/dL — ABNORMAL HIGH (ref 0.44–1.00)
GFR calc Af Amer: 41 mL/min — ABNORMAL LOW (ref >60)
GFR, EST NON AFRICAN AMERICAN: 35 mL/min — AB (ref >60)
Glucose, Bld: 153 mg/dL — ABNORMAL HIGH (ref 70–99)
POTASSIUM: 3.8 mmol/L (ref 3.5–5.1)
Sodium: 141 mmol/L (ref 135–145)

## 2018-01-18 LAB — TROPONIN I
Troponin I: <0.03 ng/mL (ref <0.03)
Troponin I: <0.03 ng/mL (ref <0.03)

## 2018-01-18 LAB — GLUCOSE, CAPILLARY
GLUCOSE-CAPILLARY: 149 mg/dL — AB (ref 70–99)
Glucose-Capillary: 141 mg/dL — ABNORMAL HIGH (ref 70–99)
Glucose-Capillary: 159 mg/dL — ABNORMAL HIGH (ref 70–99)
Glucose-Capillary: 126 mg/dL — ABNORMAL HIGH (ref 70–99)

## 2018-01-18 LAB — CBG MONITORING, ED: GLUCOSE-CAPILLARY: 143 mg/dL — AB (ref 70–99)

## 2018-01-18 MED ORDER — MORPHINE SULFATE ER 15 MG PO TBCR
30.0000 mg | EXTENDED_RELEASE_TABLET | Freq: Two times a day (BID) | ORAL | Status: DC
  Administered 2018-01-18 – 2018-01-19 (×4): 30 mg via ORAL
  Filled 2018-01-18 (×4): qty 2

## 2018-01-18 NOTE — ED Notes (Signed)
Attempted to call report to floor 

## 2018-01-18 NOTE — Progress Notes (Signed)
Progress Note  Patient Name: Andrea [name] Date of Encounter: 01/18/2018  Primary Cardiologist: Kirk Ruths, MD   Subjective   Remains worried, anxious, has dyspnea at rest that comes and goes rather unpredictably.  Occasionally thinks she hears some wheezing.  Denies angina.  Inpatient Medications    Scheduled Meds: . atorvastatin  80 mg Oral QHS  . carvedilol  6.25 mg Oral BID WC  . divalproex  1,000 mg Oral QHS  . enoxaparin (LOVENOX) injection  40 mg Subcutaneous QHS  . insulin aspart  0-15 Units Subcutaneous TID WC  . insulin aspart  0-5 Units Subcutaneous QHS  . levothyroxine  200 mcg Oral Q0600  . morphine  30 mg Oral Q12H  . pregabalin  75 mg Oral BID  . ticagrelor  90 mg Oral BID   Continuous Infusions:  PRN Meds: acetaminophen, ALPRAZolam, ondansetron (ZOFRAN) IV, oxyCODONE-acetaminophen **AND** oxyCODONE, promethazine   Vital Signs    Vitals:   01/18/18 0113 01/18/18 0733 01/18/18 0800 01/18/18 0839  BP: (!) 160/70 (!) 118/59  (!) 154/71  Pulse: 70 79  79  Resp: 20 16  18   Temp:    98.2 F (36.8 C)  TempSrc:    Oral  SpO2: 99% 98%  100%  Weight:   69.4 kg   Height:   5\' 5"  (1.651 m)     Intake/Output Summary (Last 24 hours) at 01/18/2018 1049 Last data filed at 01/18/2018 1000 Gross per 24 hour  Intake 1360 ml  Output -  Net 1360 ml   Filed Weights   01/18/18 0800  Weight: 69.4 kg    Telemetry    Sinus rhythm- Personally Reviewed  ECG    Sinus rhythm, no acute repolarization abnormalities- Personally Reviewed  Physical Exam   GEN: No acute distress.   Neck: No JVD Cardiac: RRR, no murmurs, rubs, or gallops.  Respiratory: Clear to auscultation bilaterally. GI: Soft, nontender, non-distended  MS: No edema; No deformity. Neuro:  Nonfocal  Psych: Normal affect   Labs    Chemistry Recent Labs  Lab 01/17/18 1718 01/18/18 0417  NA 138 141  K 4.7 3.8  CL 97* 101  CO2 28 28  GLUCOSE 226* 153*  BUN 33* 29*    CREATININE 1.73* 1.52*  CALCIUM 9.2 8.4*  GFRNONAA 30* 35*  GFRAA 35* 41*  ANIONGAP 13 12     Hematology Recent Labs  Lab 01/17/18 1718  WBC 11.9*  RBC 3.74*  HGB 11.3*  HCT 33.8*  MCV 90.4  MCH 30.2  MCHC 33.4  RDW 12.2  PLT 300    Cardiac Enzymes Recent Labs  Lab 01/17/18 2243 01/18/18 0116 01/18/18 0417  TROPONINI <0.03 <0.03 <0.03    Recent Labs  Lab 01/17/18 1716  TROPIPOC 0.01     BNP Recent Labs  Lab 01/17/18 1715  BNP 267.6*     DDimer No results for input(s): DDIMER in the last 168 hours.   Radiology    Ct Angio Chest Pe W And/or Wo Contrast  Result Date: 01/17/2018 CLINICAL DATA:  Chest pain and dizziness.  Shortness of breath. EXAM: CT ANGIOGRAPHY CHEST WITH CONTRAST TECHNIQUE: Multidetector CT imaging of the chest was performed using the standard protocol during bolus administration of intravenous contrast. Multiplanar CT image reconstructions and MIPs were obtained to evaluate the vascular anatomy. CONTRAST:  1107mL ISOVUE-370 IOPAMIDOL (ISOVUE-370) INJECTION 76% COMPARISON:  Chest x-ray January 17, 2018 FINDINGS: Cardiovascular: Satisfactory opacification of the pulmonary arteries to the segmental level. No evidence of  pulmonary embolism. Normal heart size. No pericardial effusion. Mediastinum/Nodes: No enlarged mediastinal, hilar, or axillary lymph nodes. Thyroid gland, trachea, and esophagus demonstrate no significant findings. Lungs/Pleura: Mild atelectasis of the posterior lung bases are noted. There is no pleural effusion, pneumothorax or focal pneumonia. Upper Abdomen: Patient status post prior cholecystectomy. The other visualized upper abdominal structures are unremarkable. Musculoskeletal: Mild degenerative joint changes of the spine are noted. Review of the MIP images confirms the above findings. IMPRESSION: No pulmonary embolus. No focal pneumonia. Mild atelectasis of bilateral posterior lung bases. Electronically Signed   By: Abelardo Diesel  M.D.   On: 01/17/2018 20:42   Dg Chest Port 1 View  Result Date: 01/17/2018 CLINICAL DATA:  Left-sided and central chest pain x2 weeks. EXAM: PORTABLE CHEST 1 VIEW COMPARISON:  01/04/2018 FINDINGS: The heart size and mediastinal contours are within normal limits. Both lungs are clear. The visualized skeletal structures are unremarkable. IMPRESSION: No active disease. Electronically Signed   By: Ashley Royalty M.D.   On: 01/17/2018 18:28    Cardiac Studies   Echo 12/26/17 Study Conclusions - Left ventricle: Apical images sub optimal due to breast implants. The cavity size was mildly dilated. Wall thickness was increased in a pattern of mild LVH. Systolic function was moderately to severely reduced. The estimated ejection fraction was in the range of 30% to 35%. Diffuse hypokinesis. Doppler parameters are consistent with both elevated ventricular end-diastolic filling pressure and elevated left atrial filling pressure. - Left atrium: The atrium was moderately dilated.  Cath12/09/2017:             RPDA lesion is 75% stenosed.  Post Atrio lesion is 50% stenosed.  Prox LAD lesion is 90% stenosed.  Dist LAD lesion is 90% stenosed.  Ost 1st Diag to 1st Diag lesion is 90% stenosed.  Ost 1st Mrg to 1st Mrg lesion is 80% stenosed.  Prox LAD to Mid LAD lesion is 40% stenosed.  There is moderate to severe left ventricular systolic dysfunction.  LV end diastolic pressure is normal.  There is no mitral valve regurgitation.  There is no aortic valve stenosis.  1. Severe 3 vessel obstructive CAD. 2. Moderate to severe LV dysfunction. EF 30-35% 3. Normal LV filling pressures 4. Normal right heart pressures. 5. Normal cardiac output.   Plan: patient is diabetic with LV dysfunction and 3 vessel CAD. Recommend surgical consultation for revascularization. If she is not felt to be a surgical candidate we could treat the proximal LAD +/- distal LAD with PCI. The other vessels  are not suitable PCI targets.    Cath12/10/2017:  Prox LAD lesion is 90% stenosed.  A drug-eluting stent was successfully placed using a STENT SYNERGY DES 3X38.  Post intervention, there is 0% residual stenosis.  Dist LAD lesion is 90% stenosed.  A drug-eluting stent was successfully placed using a STENT SYNERGY DES 2.5X16.  Post intervention, there is a 0% residual stenosis.  1. Successful PCI of the proximal and distal LAD with DES x 2.  Plan: since patient is ASA allergic I would recommend Brilinta monotherapy 90 mg bid for one year then 60 mg bid long term. Patient is a candidate for discharge from a cardiac standpoint tomorrow. Her residual CAD will be treated medically.    Patient Profile     67 y.o. female with recently diagnosed left ventricular systolic dysfunction and multivessel CAD, status post 2 drug-eluting stents to the LAD artery on 12/30/2017, with residual stenoses in the diagonal artery and oblique marginal  branch, not amenable to PCI.  Returns with shortness of breath at rest, despite the absence of edema and substantial weight loss.  Assessment & Plan    1. CHF: She has shortness of breath at rest that does not have a pattern of orthopnea and is not necessarily worsened by physical activity.  I wonder whether she is having dyspnea as a side effect from treatment with Brilinta.  Asked her to drink caffeinated beverages today.  If this does not alleviate her complaints would switch her to Effient tomorrow. 2. CAD: She does not have angina.  Has known residual severe stenosis in the diagonal and OM branches, not amenable to either surgical or percutaneous revascularization.  Discussed the importance of compliance with dual antiplatelet therapy (aspirin plus Brilinta or Effient) for at least 6 months, preferably 12 months following her stent procedure on December 10. 3. HLP: On high-dose statin.     For questions or updates, please contact [place] Please consult [email or web address] for contact info under        Signed, Sanda Klein, MD  01/18/2018, 10:49 AM

## 2018-01-18 NOTE — Progress Notes (Addendum)
PROGRESS NOTE    Andrea [name]  WRU:[id number] DOB: Aug 12, 1950 DOA: 01/17/2018 PCP: [facility], [facility]   Brief Narrative:  Andrea [name] is a 67 y.o. female with medical history significant of DM2, HTN. Patient was admitted to hospital with CP / CHF symptoms earlier this month.  Found to have EF 30-35%, severe 3 vessel dz on Cath 12/9.  Evaluated by CVTS but not felt to be great candidate for CABG.  Underwent 2X DES to prox and distal LAD on 12/10.  CP improved some though she states SOB has persisted. She was also put on spironolactone, lasix, and entresto. Seen again in hospital 12/15-12/16 for CP, down trending trop.  Returns to ED today with ongoing SOB and more CP.  CP is L sided, radiates to L arm and Jaw.  Does have some mild cough.  No wheezing.  Does have DOE.  No peripheral edema.  No fever.  Chest pain does not appear to be angina.  Patient's primary complaint is a breathlessness or shortness of breath. Review of patient's medical records from [place] shows that she was treated for her breast cancer with methotrexate, cyclophosphamide, fluorouracil and palonsetron.  Will check PFTs in am    Assessment & Plan:   Principal Problem:   Chest pain, rule out acute myocardial infarction Active Problems:   Hypertension   Insulin dependent diabetes mellitus (HCC)   Chronic pain   AKI (acute kidney injury) ([place])   Chronic systolic heart failure (HCC)  1. CP / shortness of breath- 1. CP obs pathway hold out for MI 2. Cards evaluating not think she has angina ; shortness of breath may be related to her Brilinta 3. Serial trops negative 4. Tele monitor 5. Letting patient eat for the moment while cards evaluates 6. Change Brilinta to Effient 7. Check PFTs in a.m. as patient treated with methotrexate for her breast cancer approximately 3 years ago 2. HTN - 1. Holding entresto; consider restarting in a.m. 2. Continue coreg 3. AKI - 1. Hold  Entresto; kidney injury not enough to hold Entresto at this point 2. Hold diuretics 3. Repeat BMP in AM 4. DM2 - 1. SSI moderate scale AC/HS 2. Switch to sensitive scale Q4H if she is made NPO 5. Chronic pain - continue home narcotics and lyrica  DVT prophylaxis: Lovenox Code Status: Full Family Communication: No family in room Disposition Plan: Home after admit Consults called: Cardiology Admission status: Place in obs   Subjective: Patient with a subjective feeling of breathlessness.  Review of her records shows she was treated with methotrexate for her breast cancer.  Occasions are suspected including possibly Brilinta and the methotrexate.  We will check PFTs.  Objective: Vitals:   01/18/18 0733 01/18/18 0800 01/18/18 0839 01/18/18 1248  BP: (!) 118/59  (!) 154/71 (!) 124/56  Pulse: 79  79 68  Resp: 16  18 20   Temp:   98.2 F (36.8 C)   TempSrc:   Oral   SpO2: 98%  100% 98%  Weight:  69.4 kg    Height:  5\' 5"  (1.651 m)      Intake/Output Summary (Last 24 hours) at 01/18/2018 1612 Last data filed at 01/18/2018 1000 Gross per 24 hour  Intake 1360 ml  Output -  Net 1360 ml   Filed Weights   01/18/18 0800  Weight: 69.4 kg    Examination:  General exam: Appears calm and comfortable  Respiratory system: Clear to auscultation. Respiratory effort normal. Cardiovascular system: S1 &  S2 heard, RRR. No JVD, murmurs, rubs, gallops or clicks. No pedal edema. Gastrointestinal system: Abdomen is nondistended, soft and nontender. No organomegaly or masses felt. Normal bowel sounds heard. Central nervous system: Alert and oriented. No focal neurological deficits. Extremities: Symmetric 5 x 5 power. Skin: No rashes, lesions or ulcers Psychiatry: Judgement and insight appear normal. Mood & affect appropriate.     Data Reviewed: I have personally reviewed following labs and imaging studies  CBC: Recent Labs  Lab 01/17/18 1718  WBC 11.9*  HGB 11.3*  HCT 33.8*  MCV  90.4  PLT 696   Basic Metabolic Panel: Recent Labs  Lab 01/17/18 1718 01/18/18 0417  NA 138 141  K 4.7 3.8  CL 97* 101  CO2 28 28  GLUCOSE 226* 153*  BUN 33* 29*  CREATININE 1.73* 1.52*  CALCIUM 9.2 8.4*   GFR: Estimated Creatinine Clearance: 35.6 mL/min (A) (by C-G formula based on SCr of 1.52 mg/dL (H)). Liver Function Tests: No results for input(s): AST, ALT, ALKPHOS, BILITOT, PROT, ALBUMIN in the last 168 hours. No results for input(s): LIPASE, AMYLASE in the last 168 hours. No results for input(s): AMMONIA in the last 168 hours. Coagulation Profile: No results for input(s): INR, PROTIME in the last 168 hours. Cardiac Enzymes: Recent Labs  Lab 01/17/18 2243 01/18/18 0116 01/18/18 0417  TROPONINI <0.03 <0.03 <0.03   BNP (last 3 results) No results for input(s): PROBNP in the last 8760 hours. HbA1C: No results for input(s): HGBA1C in the last 72 hours. CBG: Recent Labs  Lab 01/18/18 0200 01/18/18 0835 01/18/18 1247  GLUCAP 143* 141* 149*   Lipid Profile: No results for input(s): CHOL, HDL, LDLCALC, TRIG, CHOLHDL, LDLDIRECT in the last 72 hours. Thyroid Function Tests: No results for input(s): TSH, T4TOTAL, FREET4, T3FREE, THYROIDAB in the last 72 hours. Anemia Panel: No results for input(s): VITAMINB12, FOLATE, FERRITIN, TIBC, IRON, RETICCTPCT in the last 72 hours. Sepsis Labs: No results for input(s): PROCALCITON, LATICACIDVEN in the last 168 hours.  No results found for this or any previous visit (from the past 240 hour(s)).       Radiology Studies: Ct Angio Chest Pe W And/or Wo Contrast  Result Date: 01/17/2018 CLINICAL DATA:  Chest pain and dizziness.  Shortness of breath. EXAM: CT ANGIOGRAPHY CHEST WITH CONTRAST TECHNIQUE: Multidetector CT imaging of the chest was performed using the standard protocol during bolus administration of intravenous contrast. Multiplanar CT image reconstructions and MIPs were obtained to evaluate the vascular anatomy.  CONTRAST:  176mL ISOVUE-370 IOPAMIDOL (ISOVUE-370) INJECTION 76% COMPARISON:  Chest x-ray January 17, 2018 FINDINGS: Cardiovascular: Satisfactory opacification of the pulmonary arteries to the segmental level. No evidence of pulmonary embolism. Normal heart size. No pericardial effusion. Mediastinum/Nodes: No enlarged mediastinal, hilar, or axillary lymph nodes. Thyroid gland, trachea, and esophagus demonstrate no significant findings. Lungs/Pleura: Mild atelectasis of the posterior lung bases are noted. There is no pleural effusion, pneumothorax or focal pneumonia. Upper Abdomen: Patient status post prior cholecystectomy. The other visualized upper abdominal structures are unremarkable. Musculoskeletal: Mild degenerative joint changes of the spine are noted. Review of the MIP images confirms the above findings. IMPRESSION: No pulmonary embolus. No focal pneumonia. Mild atelectasis of bilateral posterior lung bases. Electronically Signed   By: Abelardo Diesel M.D.   On: 01/17/2018 20:42   Dg Chest Port 1 View  Result Date: 01/17/2018 CLINICAL DATA:  Left-sided and central chest pain x2 weeks. EXAM: PORTABLE CHEST 1 VIEW COMPARISON:  01/04/2018 FINDINGS: The heart size and mediastinal  contours are within normal limits. Both lungs are clear. The visualized skeletal structures are unremarkable. IMPRESSION: No active disease. Electronically Signed   By: Ashley Royalty M.D.   On: 01/17/2018 18:28        Scheduled Meds: . atorvastatin  80 mg Oral QHS  . carvedilol  6.25 mg Oral BID WC  . divalproex  1,000 mg Oral QHS  . enoxaparin (LOVENOX) injection  40 mg Subcutaneous QHS  . insulin aspart  0-15 Units Subcutaneous TID WC  . insulin aspart  0-5 Units Subcutaneous QHS  . levothyroxine  200 mcg Oral Q0600  . morphine  30 mg Oral Q12H  . pregabalin  75 mg Oral BID  . ticagrelor  90 mg Oral BID   Continuous Infusions:   LOS: 0 days    Time spent: 35 minutes    Lady Deutscher, MD FACP [facility] Pager [phone]  If 7PM-7AM, please contact night-coverage [email or web address] Password [email or web address] 01/18/2018, 4:12 PM

## 2018-01-19 ENCOUNTER — Observation Stay (HOSPITAL_COMMUNITY): Payer: Medicare Other

## 2018-01-19 ENCOUNTER — Ambulatory Visit: Payer: Medicare Other | Admitting: Cardiology

## 2018-01-19 DIAGNOSIS — I10 Essential (primary) hypertension: Secondary | ICD-10-CM

## 2018-01-19 DIAGNOSIS — R079 Chest pain, unspecified: Secondary | ICD-10-CM | POA: Diagnosis not present

## 2018-01-19 DIAGNOSIS — I2510 Atherosclerotic heart disease of native coronary artery without angina pectoris: Secondary | ICD-10-CM

## 2018-01-19 DIAGNOSIS — R0602 Shortness of breath: Secondary | ICD-10-CM | POA: Diagnosis not present

## 2018-01-19 DIAGNOSIS — I5022 Chronic systolic (congestive) heart failure: Secondary | ICD-10-CM | POA: Diagnosis not present

## 2018-01-19 DIAGNOSIS — I2583 Coronary atherosclerosis due to lipid rich plaque: Secondary | ICD-10-CM

## 2018-01-19 DIAGNOSIS — Z79899 Other long term (current) drug therapy: Secondary | ICD-10-CM

## 2018-01-19 LAB — BASIC METABOLIC PANEL
Anion gap: 13 (ref 5–15)
BUN: 36 mg/dL — ABNORMAL HIGH (ref 8–23)
CO2: 27 mmol/L (ref 22–32)
CREATININE: 2.08 mg/dL — AB (ref 0.44–1.00)
Calcium: 8.4 mg/dL — ABNORMAL LOW (ref 8.9–10.3)
Chloride: 102 mmol/L (ref 98–111)
GFR calc Af Amer: 28 mL/min — ABNORMAL LOW (ref >60)
GFR calc non Af Amer: 24 mL/min — ABNORMAL LOW (ref >60)
Glucose, Bld: 137 mg/dL — ABNORMAL HIGH (ref 70–99)
Potassium: 4.5 mmol/L (ref 3.5–5.1)
SODIUM: 142 mmol/L (ref 135–145)

## 2018-01-19 LAB — PULMONARY FUNCTION TEST
FEF 25-75 PRE: 1.84 L/s
FEF2575-%Pred-Pre: 85 %
FEV1-%Pred-Pre: 55 %
FEV1-Pre: 1.39 L
FEV1FVC-%Pred-Pre: 112 %
FEV6-%Pred-Pre: 51 %
FEV6-Pre: 1.61 L
FEV6FVC-%Pred-Pre: 104 %
FVC-%Pred-Pre: 49 %
FVC-Pre: 1.61 L
Pre FEV1/FVC ratio: 86 %
Pre FEV6/FVC Ratio: 100 %

## 2018-01-19 LAB — GLUCOSE, CAPILLARY
Glucose-Capillary: 138 mg/dL — ABNORMAL HIGH (ref 70–99)
Glucose-Capillary: 113 mg/dL — ABNORMAL HIGH (ref 70–99)
Glucose-Capillary: 140 mg/dL — ABNORMAL HIGH (ref 70–99)
Glucose-Capillary: 116 mg/dL — ABNORMAL HIGH (ref 70–99)

## 2018-01-19 LAB — CBC
HCT: 31.9 % — ABNORMAL LOW (ref 36.0–46.0)
Hemoglobin: 10.3 g/dL — ABNORMAL LOW (ref 12.0–15.0)
MCH: 28.9 pg (ref 26.0–34.0)
MCHC: 32.3 g/dL (ref 30.0–36.0)
MCV: 89.6 fL (ref 80.0–100.0)
Platelets: 247 10*3/uL (ref 150–400)
RBC: 3.56 MIL/uL — ABNORMAL LOW (ref 3.87–5.11)
RDW: 12.3 % (ref 11.5–15.5)
WBC: 8.6 10*3/uL (ref 4.0–10.5)
nRBC: 0 % (ref 0.0–0.2)

## 2018-01-19 LAB — MAGNESIUM: MAGNESIUM: 1.5 mg/dL — AB (ref 1.7–2.4)

## 2018-01-19 MED ORDER — ALPRAZOLAM 0.25 MG PO TABS: 0.2500 mg | ORAL_TABLET | Freq: Three times a day (TID) | ORAL | Status: DC | PRN

## 2018-01-19 MED ORDER — SENNOSIDES-DOCUSATE SODIUM 8.6-50 MG PO TABS
2.0000 | ORAL_TABLET | Freq: Two times a day (BID) | ORAL | Status: DC
  Administered 2018-01-19 – 2018-01-22 (×5): 2 via ORAL
  Filled 2018-01-19 (×7): qty 2

## 2018-01-19 MED ORDER — PRASUGREL HCL 10 MG PO TABS
60.0000 mg | ORAL_TABLET | Freq: Once | ORAL | Status: AC
  Administered 2018-01-19: 60 mg via ORAL
  Filled 2018-01-19: qty 6

## 2018-01-19 MED ORDER — PRASUGREL HCL 10 MG PO TABS
10.0000 mg | ORAL_TABLET | Freq: Every day | ORAL | Status: DC
  Administered 2018-01-20 – 2018-01-22 (×3): 10 mg via ORAL
  Filled 2018-01-19 (×3): qty 1

## 2018-01-19 MED ORDER — POLYETHYLENE GLYCOL 3350 17 G PO PACK
17.0000 g | PACK | Freq: Every day | ORAL | Status: DC
  Administered 2018-01-19 – 2018-01-22 (×4): 17 g via ORAL
  Filled 2018-01-19 (×4): qty 1

## 2018-01-19 NOTE — Progress Notes (Signed)
Progress Note  Patient Name: Andrea [name] Date of Encounter: 01/19/2018  Primary Cardiologist: Kirk Ruths, MD  Subjective   Pt continues to be SOB with ambulation. Does no appear to be fluid volume overloaded on exam. Denies chest pain. Underwent PFT's today   Inpatient Medications    Scheduled Meds: . atorvastatin  80 mg Oral QHS  . carvedilol  6.25 mg Oral BID WC  . divalproex  1,000 mg Oral QHS  . enoxaparin (LOVENOX) injection  40 mg Subcutaneous QHS  . insulin aspart  0-15 Units Subcutaneous TID WC  . insulin aspart  0-5 Units Subcutaneous QHS  . levothyroxine  200 mcg Oral Q0600  . morphine  30 mg Oral Q12H  . pregabalin  75 mg Oral BID  . ticagrelor  90 mg Oral BID   Continuous Infusions:  PRN Meds: acetaminophen, ALPRAZolam, ondansetron (ZOFRAN) IV, oxyCODONE-acetaminophen **AND** oxyCODONE, promethazine   Vital Signs    Vitals:   01/18/18 1248 01/18/18 1706 01/18/18 2201 01/19/18 0611  BP: (!) 124/56 (!) 112/57 (!) 155/64 119/79  Pulse: 68 71 81 80  Resp: 20 20 20 18   Temp:  98.7 F (37.1 C) 98.4 F (36.9 C) 98.5 F (36.9 C)  TempSrc:  Oral Oral Oral  SpO2: 98% 97% 94% 93%  Weight:    70.9 kg  Height:        Intake/Output Summary (Last 24 hours) at 01/19/2018 1029 Last data filed at 01/19/2018 0100 Gross per 24 hour  Intake 720 ml  Output 300 ml  Net 420 ml   Filed Weights   01/18/18 0800 01/19/18 0611  Weight: 69.4 kg 70.9 kg    Physical Exam   General: Frail, ill-appearing,  NAD Skin: Warm, dry, intact  Head: Normocephalic, atraumatic, clear, moist mucus membranes. Neck: Negative for carotid bruits. No JVD Lungs:Clear to ausculation bilaterally. No wheezes, rales, or rhonchi. Breathing is unlabored. Cardiovascular: RRR with S1 S2. No murmurs, rubs, gallops, or LV heave appreciated. MSK: Strength and tone appear normal for age. 5/5 in all extremities Extremities: No edema. No clubbing or cyanosis. DP/PT pulses 2+  bilaterally Neuro: Alert and oriented. No focal deficits. No facial asymmetry. MAE spontaneously. Psych: Responds to questions appropriately with normal affect.    Labs    Chemistry Recent Labs  Lab 01/17/18 1718 01/18/18 0417 01/19/18 0741  NA 138 141 142  K 4.7 3.8 4.5  CL 97* 101 102  CO2 28 28 27   GLUCOSE 226* 153* 137*  BUN 33* 29* 36*  CREATININE 1.73* 1.52* 2.08*  CALCIUM 9.2 8.4* 8.4*  GFRNONAA 30* 35* 24*  GFRAA 35* 41* 28*  ANIONGAP 13 12 13      Hematology Recent Labs  Lab 01/17/18 1718 01/19/18 0741  WBC 11.9* 8.6  RBC 3.74* 3.56*  HGB 11.3* 10.3*  HCT 33.8* 31.9*  MCV 90.4 89.6  MCH 30.2 28.9  MCHC 33.4 32.3  RDW 12.2 12.3  PLT 300 247    Cardiac Enzymes Recent Labs  Lab 01/17/18 2243 01/18/18 0116 01/18/18 0417  TROPONINI <0.03 <0.03 <0.03    Recent Labs  Lab 01/17/18 1716  TROPIPOC 0.01     BNP Recent Labs  Lab 01/17/18 1715  BNP 267.6*    DDimer No results for input(s): DDIMER in the last 168 hours.   Radiology    Ct Angio Chest Pe W And/or Wo Contrast  Result Date: 01/17/2018 CLINICAL DATA:  Chest pain and dizziness.  Shortness of breath. EXAM: CT ANGIOGRAPHY CHEST  WITH CONTRAST TECHNIQUE: Multidetector CT imaging of the chest was performed using the standard protocol during bolus administration of intravenous contrast. Multiplanar CT image reconstructions and MIPs were obtained to evaluate the vascular anatomy. CONTRAST:  16mL ISOVUE-370 IOPAMIDOL (ISOVUE-370) INJECTION 76% COMPARISON:  Chest x-ray January 17, 2018 FINDINGS: Cardiovascular: Satisfactory opacification of the pulmonary arteries to the segmental level. No evidence of pulmonary embolism. Normal heart size. No pericardial effusion. Mediastinum/Nodes: No enlarged mediastinal, hilar, or axillary lymph nodes. Thyroid gland, trachea, and esophagus demonstrate no significant findings. Lungs/Pleura: Mild atelectasis of the posterior lung bases are noted. There is no pleural  effusion, pneumothorax or focal pneumonia. Upper Abdomen: Patient status post prior cholecystectomy. The other visualized upper abdominal structures are unremarkable. Musculoskeletal: Mild degenerative joint changes of the spine are noted. Review of the MIP images confirms the above findings. IMPRESSION: No pulmonary embolus. No focal pneumonia. Mild atelectasis of bilateral posterior lung bases. Electronically Signed   By: Abelardo Diesel M.D.   On: 01/17/2018 20:42   Dg Chest Port 1 View  Result Date: 01/17/2018 CLINICAL DATA:  Left-sided and central chest pain x2 weeks. EXAM: PORTABLE CHEST 1 VIEW COMPARISON:  01/04/2018 FINDINGS: The heart size and mediastinal contours are within normal limits. Both lungs are clear. The visualized skeletal structures are unremarkable. IMPRESSION: No active disease. Electronically Signed   By: Ashley Royalty M.D.   On: 01/17/2018 18:28   Telemetry    01/19/18 NSR- Personally Reviewed  ECG    No new tracing as of 01/19/18- Personally Reviewed  Cardiac Studies   Echo 12/26/17 Study Conclusions - Left ventricle: Apical images sub optimal due to breast implants. The cavity size was mildly dilated. Wall thickness was increased in a pattern of mild LVH. Systolic function was moderately to severely reduced. The estimated ejection fraction was in the range of 30% to 35%. Diffuse hypokinesis. Doppler parameters are consistent with both elevated ventricular end-diastolic filling pressure and elevated left atrial filling pressure. - Left atrium: The atrium was moderately dilated.  Cath12/09/2017: RPDA lesion is 75% stenosed.  Post Atrio lesion is 50% stenosed.  Prox LAD lesion is 90% stenosed.  Dist LAD lesion is 90% stenosed.  Ost 1st Diag to 1st Diag lesion is 90% stenosed.  Ost 1st Mrg to 1st Mrg lesion is 80% stenosed.  Prox LAD to Mid LAD lesion is 40% stenosed.  There is moderate to severe left ventricular systolic  dysfunction.  LV end diastolic pressure is normal.  There is no mitral valve regurgitation.  There is no aortic valve stenosis.  1. Severe 3 vessel obstructive CAD. 2. Moderate to severe LV dysfunction. EF 30-35% 3. Normal LV filling pressures 4. Normal right heart pressures. 5. Normal cardiac output.   Plan: patient is diabetic with LV dysfunction and 3 vessel CAD. Recommend surgical consultation for revascularization. If she is not felt to be a surgical candidate we could treat the proximal LAD +/- distal LAD with PCI. The other vessels are not suitable PCI targets.    Cath12/10/2017:  Prox LAD lesion is 90% stenosed.  A drug-eluting stent was successfully placed using a STENT SYNERGY DES 3X38.  Post intervention, there is 0% residual stenosis.  Dist LAD lesion is 90% stenosed.  A drug-eluting stent was successfully placed using a STENT SYNERGY DES 2.5X16.  Post intervention, there is a 0% residual stenosis.  1. Successful PCI of the proximal and distal LAD with DES x 2.  Plan: since patient is ASA allergic I would recommend Brilinta monotherapy  90 mg bid for one year then 60 mg bid long term. Patient is a candidate for discharge from a cardiac standpoint tomorrow. Her residual CAD will be treated medically.   Patient Profile     67 y.o. female recently diagnosed left ventricular systolic dysfunction and multivessel CAD, status post 2 drug-eluting stents to the LAD artery on 12/30/2017, with residual stenoses in the diagonal artery and oblique marginal branch, not amenable to PCI who returned with shortness of breath at rest, despite the absence of edema and substantial weight gain  Assessment & Plan    1.  Resting dyspnea with hx of systolic HF with an LVEF of 30-35%: -Patient presented with resting shortness of breath despite absence of edema or substantial weight gain. She was recently diagnosed with LV systolic dysfunction and multivessel CAD s/p 2 DES stents to  the LAD on 12/30/2017 with residual stenosis in the diagonal artery and OM branch 9 amenable to PCI>>not felt to be a surgical candidate  -Trop, negative this admission  -Per chart review, dyspnea possibly in the setting of treatment with Brilinta. Plans were to transition to Effient if resting dyspnea persists>>will discuss with rounding MD  -Pt underwent PFT's and 6 minute walk test with pending final results however pt was symptomatic with testing.  -Continue carvedilol  -Weight, 156lb today>weight at last office visit on 01/08/18 156lb -I&O, net positive 2L  2.  CAD s/p recent DES to LADx2 10/2017: -Denies anginal symptoms -See cath reports above -Continue Toprol XL    3.  HLD: -Stable, on high-dose statin  Signed, Kathyrn Drown NP-C [facility] Pager: [phone] 01/19/2018, 10:29 AM    For questions or updates, please contact   Please consult [email or web address] for contact info under Cardiology/STEMI.

## 2018-01-19 NOTE — Progress Notes (Signed)
Pt received from PFT lab, pt in bed, says she feels better, no further nausea, dizziness or sob, vss, says she still feels woozy like, paged Dr Evangeline Gula to advise of not able to tolerate test per RT, pt says she did not feel it was that bad and wanted to know options now., CCMD called to inform pt is back from PFT lab

## 2018-01-19 NOTE — Care Management Note (Signed)
Case Management Note  Patient Details  Name: Andrea [name] MRN: [id number] Date of Birth: 12-Dec-1950  Subjective/Objective:    Chest Pain               Action/Plan: 01/20/2018 - Noted patient admitted 3 times in 6 months; CM will continue to follow for progression of care; Aneta Mins [phone]  01/05/2018 - Action/Plan: CM consult acknowledged for medication assist. CM met with patient to discuss transitional needs. Patient stated living at home alone, ambulates with a RW; son lives 7 miles away and assists as needed. Patient has active insurance with [facility] Medicare; Kary Kos and Delene Loll was filled and provided to patient on 12/31/17; patient verbalized to CM that she has obtained all of her Rx since transitioning home on 12/31/17. PCP verified as: [facility]; pharmacy of choice[facility]. No further needs from CM. Maryagnes Amos RN CM  Expected Discharge Date:   possibly 01/21/2018               Expected Discharge Plan:   Home  Status of Service:   In progress  Sherrilyn Rist [phone] 01/19/2018, 1:39 PM

## 2018-01-19 NOTE — Progress Notes (Signed)
Patient brought to pulmonary lab for PFT and 6 minute walk test.  Attempted to perform 6 minute walk; patient unable to start the test due to becoming extremely dizzy and "woozy".  Had patient sit in wheelchair and was taken back to PFT lab.  Patient given time to rest before attempting next test.  Patient stated that she felt better, and was ready to proceed.  Attempted 2 FVCs for the PFT; patient became very pale and dizzy.  Checked SpO2- ranged from 88%-97%.  Placed patient back in wheelchair to be brought back to her inpatient room.  Patient started complaining of nausea.  Patient brought back to inpatient room, and placed in bed. RN notified of events.

## 2018-01-19 NOTE — Progress Notes (Signed)
PROGRESS NOTE    Andrea [name]  QZR:[id number] DOB: Dec 19, 1950 DOA: 01/17/2018 PCP: Medicine, [facility]   Brief Narrative:  Andrea [name] a 67 y.o.femalewith medical history significant ofDM2, HTN. Patient was admitted to hospital with CP / CHF symptoms earlier this month. Found to have EF 30-35%, severe 3 vessel dz on Cath 12/9. Evaluated by CVTS but not felt to be great candidate for CABG. Underwent 2X DES to prox and distal LAD on 12/10. CP improved some though she states SOB has persisted. She was also put on spironolactone, lasix, and entresto. Seen again in hospital 12/15-12/16 for CP, down trending trop.  Returns to ED today with ongoing SOB and more CP. CP is L sided, radiates to L arm and Jaw. Does have some mild cough. No wheezing. Does have DOE. No peripheral edema. No fever.  Chest pain does not appear to be angina.  Patient's primary complaint is a breathlessness or shortness of breath. Review of patient's medical records from [place] shows that she was treated for her breast cancer with methotrexate, cyclophosphamide, fluorouracil and palonsetron.   PFTs were ordered to be evaluated today but patient became dizzy, nauseated and while in pulmonary function lab.  Unable to perform the test.  She was brought back to her room.  This morning prior to her going to the PFT labs I had a long discussion with her regarding all of the sedating medication she is on.  She is really unable to stay awake.  She says she is like this all the time.  She takes multiple medications including Xanax, Phenergan, MS Contin, Percocet, oxycodone, and Lyrica.  Have made adjustments in her medications because I believe that they are causing her dizziness and nausea.  I decreased her Xanax dose and frequency to 0.25 mg 3 times daily as needed, I have discontinued her oxycodone but continued the Percocet, I have discontinued the Lyrica.  I have discontinue the  Phenergan.  I also note the patient is not on a bowel regimen despite being on long-acting opiates.  I am going to start her on MiraLAX and Senokot.  I wonder if her chief complaint of shortness of breath is not in fact related to all of these multiple sedating medications, as they can cause depressed respiratory status.   Assessment & Plan:   Principal Problem:   Chest pain, rule out acute myocardial infarction Active Problems:   High risk medication use   Hypertension   Insulin dependent diabetes mellitus (HCC)   Chronic pain   AKI (acute kidney injury) ([place])   Chronic systolic heart failure (HCC)   1. CP / shortness of breath- 1. CP obs pathway ruled out for MI 2. Cards evaluating they do not think she has angina ; shortness of breath may be related to her Brilinta 3. Serial trops negative 4. Tele monitor 5. Letting patient eat for the moment while cards evaluates 6. Change Brilinta to Effient 7. Will retry to Check PFTs in a.m. as patient treated with methotrexate for her breast cancer approximately 3 years ago.  Patient multiple sedating medications need to be addressed.  See my comments above 2.   HTN - 1. Holding entresto; consider restarting once creatinine improved 2. Continue coreg 3. AKI - 1. Hold Entresto;  creatinine is increasing 2. Hold diuretics 3. Repeat BMP in AM 4. DM2 - 1. SSI moderate scale AC/HS 2. Switch to sensitive scale Q4H if she is made NPO 5. Chronic pain - continue home narcotics  and lyrica 6. High risk medication use: This morning prior to her going to the PFT labs I had a long discussion with her regarding all of the sedating medication she is on.  She is really unable to stay awake.  She says she is like this all the time.  She takes multiple medications including Xanax, Phenergan, MS Contin, Percocet, oxycodone, and Lyrica.  Have made adjustments in her medications because I believe that they are causing her dizziness and nausea.  I decreased her  Xanax dose and frequency to 0.25 mg 3 times daily as needed, I have discontinued her oxycodone but continued the Percocet, I have discontinued the Lyrica.  I have discontinue the Phenergan.  I also note the patient is not on a bowel regimen despite being on long-acting opiates.  I am going to start her on MiraLAX and Senokot.  I wonder if her chief complaint of shortness of breath is not in fact related to all of these multiple sedating medications, as they can cause depressed respiratory status.   DVT prophylaxis:Lovenox Code Status:Full Family Communication:No family in room Disposition Plan:Home after admit Consults called:Cardiology Admission status:Place in obs   Subjective: Sedated and falling asleep.  Unable to stay awake.  Feels that she needs all of the medications described in problem #6.  I have adjusted doses and discontinued some.  Objective: Vitals:   01/18/18 1706 01/18/18 2201 01/19/18 0611 01/19/18 1107  BP: (!) 112/57 (!) 155/64 119/79 137/66  Pulse: 71 81 80 82  Resp: 20 20 18    Temp: 98.7 F (37.1 C) 98.4 F (36.9 C) 98.5 F (36.9 C)   TempSrc: Oral Oral Oral   SpO2: 97% 94% 93% 99%  Weight:   70.9 kg   Height:        Intake/Output Summary (Last 24 hours) at 01/19/2018 1231 Last data filed at 01/19/2018 1100 Gross per 24 hour  Intake 960 ml  Output 300 ml  Net 660 ml   Filed Weights   01/18/18 0800 01/19/18 0611  Weight: 69.4 kg 70.9 kg    Examination:  General exam: Appears calm and comfortable, sedated and confused Respiratory system: Clear to auscultation. Respiratory effort normal. Cardiovascular system: S1 & S2 heard, RRR. No JVD, murmurs, rubs, gallops or clicks. No pedal edema. Gastrointestinal system: Abdomen is nondistended, soft and nontender. No organomegaly or masses felt. Normal bowel sounds heard. Central nervous system: Alert and oriented. No focal neurological deficits. Extremities: Symmetric 5 x 5 power. Skin: No rashes,  lesions or ulcers Psychiatry: Judgement and insight appear poor due to level of sedation. Mood & affect appropriate.     Data Reviewed: I have personally reviewed following labs and imaging studies  CBC: Recent Labs  Lab 01/17/18 1718 01/19/18 0741  WBC 11.9* 8.6  HGB 11.3* 10.3*  HCT 33.8* 31.9*  MCV 90.4 89.6  PLT 300 960   Basic Metabolic Panel: Recent Labs  Lab 01/17/18 1718 01/18/18 0417 01/19/18 0741  NA 138 141 142  K 4.7 3.8 4.5  CL 97* 101 102  CO2 28 28 27   GLUCOSE 226* 153* 137*  BUN 33* 29* 36*  CREATININE 1.73* 1.52* 2.08*  CALCIUM 9.2 8.4* 8.4*  MG  --   --  1.5*   GFR: Estimated Creatinine Clearance: 26.3 mL/min (A) (by C-G formula based on SCr of 2.08 mg/dL (H)). Liver Function Tests: No results for input(s): AST, ALT, ALKPHOS, BILITOT, PROT, ALBUMIN in the last 168 hours. No results for input(s): LIPASE, AMYLASE  in the last 168 hours. No results for input(s): AMMONIA in the last 168 hours. Coagulation Profile: No results for input(s): INR, PROTIME in the last 168 hours. Cardiac Enzymes: Recent Labs  Lab 01/17/18 2243 01/18/18 0116 01/18/18 0417  TROPONINI <0.03 <0.03 <0.03   BNP (last 3 results) No results for input(s): PROBNP in the last 8760 hours. HbA1C: No results for input(s): HGBA1C in the last 72 hours. CBG: Recent Labs  Lab 01/18/18 0835 01/18/18 1247 01/18/18 1701 01/18/18 2203 01/19/18 0804  GLUCAP 141* 149* 159* 126* 138*   Lipid Profile: No results for input(s): CHOL, HDL, LDLCALC, TRIG, CHOLHDL, LDLDIRECT in the last 72 hours. Thyroid Function Tests: No results for input(s): TSH, T4TOTAL, FREET4, T3FREE, THYROIDAB in the last 72 hours. Anemia Panel: No results for input(s): VITAMINB12, FOLATE, FERRITIN, TIBC, IRON, RETICCTPCT in the last 72 hours. Sepsis Labs: No results for input(s): PROCALCITON, LATICACIDVEN in the last 168 hours.  No results found for this or any previous visit (from the past 240 hour(s)).        Radiology Studies: Ct Angio Chest Pe W And/or Wo Contrast  Result Date: 01/17/2018 CLINICAL DATA:  Chest pain and dizziness.  Shortness of breath. EXAM: CT ANGIOGRAPHY CHEST WITH CONTRAST TECHNIQUE: Multidetector CT imaging of the chest was performed using the standard protocol during bolus administration of intravenous contrast. Multiplanar CT image reconstructions and MIPs were obtained to evaluate the vascular anatomy. CONTRAST:  134mL ISOVUE-370 IOPAMIDOL (ISOVUE-370) INJECTION 76% COMPARISON:  Chest x-ray January 17, 2018 FINDINGS: Cardiovascular: Satisfactory opacification of the pulmonary arteries to the segmental level. No evidence of pulmonary embolism. Normal heart size. No pericardial effusion. Mediastinum/Nodes: No enlarged mediastinal, hilar, or axillary lymph nodes. Thyroid gland, trachea, and esophagus demonstrate no significant findings. Lungs/Pleura: Mild atelectasis of the posterior lung bases are noted. There is no pleural effusion, pneumothorax or focal pneumonia. Upper Abdomen: Patient status post prior cholecystectomy. The other visualized upper abdominal structures are unremarkable. Musculoskeletal: Mild degenerative joint changes of the spine are noted. Review of the MIP images confirms the above findings. IMPRESSION: No pulmonary embolus. No focal pneumonia. Mild atelectasis of bilateral posterior lung bases. Electronically Signed   By: Abelardo Diesel M.D.   On: 01/17/2018 20:42   Dg Chest Port 1 View  Result Date: 01/17/2018 CLINICAL DATA:  Left-sided and central chest pain x2 weeks. EXAM: PORTABLE CHEST 1 VIEW COMPARISON:  01/04/2018 FINDINGS: The heart size and mediastinal contours are within normal limits. Both lungs are clear. The visualized skeletal structures are unremarkable. IMPRESSION: No active disease. Electronically Signed   By: Ashley Royalty M.D.   On: 01/17/2018 18:28        Scheduled Meds: . atorvastatin  80 mg Oral QHS  . carvedilol  6.25 mg Oral  BID WC  . divalproex  1,000 mg Oral QHS  . enoxaparin (LOVENOX) injection  40 mg Subcutaneous QHS  . insulin aspart  0-15 Units Subcutaneous TID WC  . insulin aspart  0-5 Units Subcutaneous QHS  . levothyroxine  200 mcg Oral Q0600  . morphine  30 mg Oral Q12H  . polyethylene glycol  17 g Oral Daily  . senna-docusate  2 tablet Oral BID  . ticagrelor  90 mg Oral BID   Continuous Infusions:   LOS: 0 days    Time spent: 38 minutes    Lady Deutscher, MD FACP [facility] Pager [phone]  If 7PM-7AM, please contact night-coverage [email or web address] Password [facility] 01/19/2018, 12:31 PM

## 2018-01-20 ENCOUNTER — Observation Stay (HOSPITAL_COMMUNITY): Payer: Medicare Other

## 2018-01-20 ENCOUNTER — Encounter (HOSPITAL_COMMUNITY): Payer: Medicare Other

## 2018-01-20 DIAGNOSIS — R079 Chest pain, unspecified: Secondary | ICD-10-CM | POA: Diagnosis not present

## 2018-01-20 DIAGNOSIS — K5900 Constipation, unspecified: Secondary | ICD-10-CM | POA: Diagnosis present

## 2018-01-20 DIAGNOSIS — N179 Acute kidney failure, unspecified: Secondary | ICD-10-CM | POA: Diagnosis not present

## 2018-01-20 DIAGNOSIS — I10 Essential (primary) hypertension: Secondary | ICD-10-CM | POA: Diagnosis not present

## 2018-01-20 DIAGNOSIS — I5022 Chronic systolic (congestive) heart failure: Secondary | ICD-10-CM | POA: Diagnosis not present

## 2018-01-20 DIAGNOSIS — R0602 Shortness of breath: Secondary | ICD-10-CM | POA: Diagnosis not present

## 2018-01-20 DIAGNOSIS — J9601 Acute respiratory failure with hypoxia: Secondary | ICD-10-CM | POA: Diagnosis not present

## 2018-01-20 LAB — URINALYSIS, ROUTINE W REFLEX MICROSCOPIC
BILIRUBIN URINE: NEGATIVE
Bacteria, UA: NONE SEEN
Glucose, UA: NEGATIVE mg/dL
Hgb urine dipstick: NEGATIVE
Ketones, ur: 5 mg/dL — AB
Nitrite: NEGATIVE
Protein, ur: 100 mg/dL — AB
Specific Gravity, Urine: 1.021 (ref 1.005–1.030)
pH: 5 (ref 5.0–8.0)

## 2018-01-20 LAB — BASIC METABOLIC PANEL
Anion gap: 12 (ref 5–15)
BUN: 37 mg/dL — ABNORMAL HIGH (ref 8–23)
CO2: 27 mmol/L (ref 22–32)
Calcium: 8.7 mg/dL — ABNORMAL LOW (ref 8.9–10.3)
Chloride: 102 mmol/L (ref 98–111)
Creatinine, Ser: 2.27 mg/dL — ABNORMAL HIGH (ref 0.44–1.00)
GFR calc Af Amer: 25 mL/min — ABNORMAL LOW (ref >60)
GFR calc non Af Amer: 22 mL/min — ABNORMAL LOW (ref >60)
Glucose, Bld: 129 mg/dL — ABNORMAL HIGH (ref 70–99)
Potassium: 4.5 mmol/L (ref 3.5–5.1)
Sodium: 141 mmol/L (ref 135–145)

## 2018-01-20 LAB — GLUCOSE, CAPILLARY
Glucose-Capillary: 107 mg/dL — ABNORMAL HIGH (ref 70–99)
Glucose-Capillary: 116 mg/dL — ABNORMAL HIGH (ref 70–99)
Glucose-Capillary: 137 mg/dL — ABNORMAL HIGH (ref 70–99)
Glucose-Capillary: 154 mg/dL — ABNORMAL HIGH (ref 70–99)

## 2018-01-20 LAB — CBC
HCT: 32.7 % — ABNORMAL LOW (ref 36.0–46.0)
Hemoglobin: 10.4 g/dL — ABNORMAL LOW (ref 12.0–15.0)
MCH: 28.8 pg (ref 26.0–34.0)
MCHC: 31.8 g/dL (ref 30.0–36.0)
MCV: 90.6 fL (ref 80.0–100.0)
Platelets: 292 10*3/uL (ref 150–400)
RBC: 3.61 MIL/uL — ABNORMAL LOW (ref 3.87–5.11)
RDW: 12.2 % (ref 11.5–15.5)
WBC: 9.4 10*3/uL (ref 4.0–10.5)
nRBC: 0 % (ref 0.0–0.2)

## 2018-01-20 LAB — VITAMIN B12: Vitamin B-12: 673 pg/mL (ref 180–914)

## 2018-01-20 MED ORDER — SODIUM CHLORIDE 0.45 % IV SOLN
INTRAVENOUS | Status: DC
  Administered 2018-01-20: 08:00:00 via INTRAVENOUS

## 2018-01-20 MED ORDER — DIPHENHYDRAMINE-ZINC ACETATE 2-0.1 % EX CREA
TOPICAL_CREAM | Freq: Three times a day (TID) | CUTANEOUS | Status: DC | PRN
  Administered 2018-01-21 (×2): via TOPICAL
  Filled 2018-01-20: qty 28

## 2018-01-20 MED ORDER — ALPRAZOLAM 0.25 MG PO TABS
0.2500 mg | ORAL_TABLET | Freq: Two times a day (BID) | ORAL | Status: DC | PRN
  Administered 2018-01-20 – 2018-01-21 (×3): 0.25 mg via ORAL
  Filled 2018-01-20 (×3): qty 1

## 2018-01-20 MED ORDER — MORPHINE SULFATE ER 15 MG PO TBCR
15.0000 mg | EXTENDED_RELEASE_TABLET | Freq: Two times a day (BID) | ORAL | Status: DC
  Administered 2018-01-20 – 2018-01-22 (×5): 15 mg via ORAL
  Filled 2018-01-20 (×5): qty 1

## 2018-01-20 MED ORDER — ENOXAPARIN SODIUM 30 MG/0.3ML ~~LOC~~ SOLN
30.0000 mg | Freq: Every day | SUBCUTANEOUS | Status: DC
  Administered 2018-01-20: 30 mg via SUBCUTANEOUS
  Filled 2018-01-20: qty 0.3

## 2018-01-20 MED ORDER — SODIUM CHLORIDE 0.45 % IV SOLN
INTRAVENOUS | Status: AC
  Administered 2018-01-20 (×2): via INTRAVENOUS

## 2018-01-20 NOTE — Progress Notes (Signed)
Respiratory Note:  Spoke with Bronson Curb, RN. She stated patient is unable to tolerate PFT testing today; RN stated that patient is very lethargic, patient is experiencing nausea and vomiting; patient unable to come to pulmonary lab for PFT study.

## 2018-01-20 NOTE — Progress Notes (Signed)
Progress Note  Patient Name: Andrea [name] Date of Encounter: 01/20/2018  Primary Cardiologist: Kirk Ruths, MD  Subjective   Pt reports SOB improved after the change with her Brillinta. She states she had multiple episodes of vomiting yesterday afternoon. Feels better this AM. Denies chest or abdominal pain   Inpatient Medications    Scheduled Meds: . atorvastatin  80 mg Oral QHS  . carvedilol  6.25 mg Oral BID WC  . divalproex  1,000 mg Oral QHS  . enoxaparin (LOVENOX) injection  40 mg Subcutaneous QHS  . insulin aspart  0-15 Units Subcutaneous TID WC  . insulin aspart  0-5 Units Subcutaneous QHS  . levothyroxine  200 mcg Oral Q0600  . morphine  15 mg Oral Q12H  . polyethylene glycol  17 g Oral Daily  . prasugrel  10 mg Oral Daily  . senna-docusate  2 tablet Oral BID   Continuous Infusions: . sodium chloride     PRN Meds: acetaminophen, ALPRAZolam, ondansetron (ZOFRAN) IV, oxyCODONE-acetaminophen **AND** [DISCONTINUED] oxyCODONE   Vital Signs    Vitals:   01/19/18 2032 01/19/18 2300 01/20/18 0347 01/20/18 0758  BP: 140/67  139/64 137/66  Pulse: 93  91 91  Resp: 18  18 16   Temp: (!) 101.3 F (38.5 C) (!) 100.9 F (38.3 C) 98.7 F (37.1 C) 99.5 F (37.5 C)  TempSrc: Oral Oral Oral Oral  SpO2: 90%  96% 96%  Weight:   68.5 kg   Height:        Intake/Output Summary (Last 24 hours) at 01/20/2018 0852 Last data filed at 01/19/2018 2317 Gross per 24 hour  Intake 480 ml  Output 700 ml  Net -220 ml   Filed Weights   01/18/18 0800 01/19/18 0611 01/20/18 0347  Weight: 69.4 kg 70.9 kg 68.5 kg    Physical Exam   General: Ill-appearing, NAD Skin: Warm, dry, intact  Head: Normocephalic, atraumatic, clear, moist mucus membranes. Neck: Negative for carotid bruits. No JVD Lungs:Clear to ausculation bilaterally. No wheezes, rales, or rhonchi. Breathing is unlabored. Cardiovascular: RRR with S1 S2. No murmurs, rubs, gallops, or LV heave  appreciated. Abdomen: Soft, non-tender, non-distended with normoactive bowel sounds. No obvious abdominal masses. MSK: Strength and tone appear normal for age. 5/5 in all extremities Extremities: No edema. No clubbing or cyanosis. DP/PT pulses 2+ bilaterally Neuro: Alert and oriented. No focal deficits. No facial asymmetry. MAE spontaneously. Psych: Responds to questions appropriately with normal affect.    Labs    Chemistry Recent Labs  Lab 01/18/18 0417 01/19/18 0741 01/20/18 0739  NA 141 142 141  K 3.8 4.5 4.5  CL 101 102 102  CO2 28 27 27   GLUCOSE 153* 137* 129*  BUN 29* 36* 37*  CREATININE 1.52* 2.08* 2.27*  CALCIUM 8.4* 8.4* 8.7*  GFRNONAA 35* 24* 22*  GFRAA 41* 28* 25*  ANIONGAP 12 13 12      Hematology Recent Labs  Lab 01/17/18 1718 01/19/18 0741 01/20/18 0739  WBC 11.9* 8.6 9.4  RBC 3.74* 3.56* 3.61*  HGB 11.3* 10.3* 10.4*  HCT 33.8* 31.9* 32.7*  MCV 90.4 89.6 90.6  MCH 30.2 28.9 28.8  MCHC 33.4 32.3 31.8  RDW 12.2 12.3 12.2  PLT 300 247 292    Cardiac Enzymes Recent Labs  Lab 01/17/18 2243 01/18/18 0116 01/18/18 0417  TROPONINI <0.03 <0.03 <0.03    Recent Labs  Lab 01/17/18 1716  TROPIPOC 0.01     BNP Recent Labs  Lab 01/17/18 1715  BNP  267.6*     DDimer No results for input(s): DDIMER in the last 168 hours.   Radiology    Dg Abd 1 View  Result Date: 01/19/2018 CLINICAL DATA:  Nausea and vomiting for the past day. EXAM: ABDOMEN - 1 VIEW COMPARISON:  CT abdomen pelvis dated Jun 01, 2017. FINDINGS: The bowel gas pattern is normal. Large amount of stool in the right colon. No radio-opaque calculi or other significant radiographic abnormality are seen. Contrast within the bladder. Prior cholecystectomy. No acute osseous abnormality. IMPRESSION: 1. Large amount of stool in the right colon, suggestive of constipation. No obstruction. Electronically Signed   By: Titus Dubin M.D.   On: 01/19/2018 16:48   Dg Chest Port 1 View  Result  Date: 01/20/2018 CLINICAL DATA:  Fever, nausea, and vomiting. History of breast malignancy with bilateral mastectomy, diabetes. EXAM: PORTABLE CHEST 1 VIEW COMPARISON:  Portable chest x-ray of January 17, 2018 and chest CT scan of the same day. FINDINGS: The lungs are adequately inflated and clear. The heart and pulmonary vascularity are normal. The mediastinum is normal in width. There is no pleural effusion. The bony thorax is unremarkable. IMPRESSION: There is no active cardiopulmonary disease. Electronically Signed   By: David  Martinique M.D.   On: 01/20/2018 08:00   Telemetry    12/31/189 ST HR 90's - Personally Reviewed  ECG    No new tracing as of 01/20/18- Personally Reviewed  Cardiac Studies   Echo 12/26/17 Study Conclusions - Left ventricle: Apical images sub optimal due to breast implants. The cavity size was mildly dilated. Wall thickness was increased in a pattern of mild LVH. Systolic function was moderately to severely reduced. The estimated ejection fraction was in the range of 30% to 35%. Diffuse hypokinesis. Doppler parameters are consistent with both elevated ventricular end-diastolic filling pressure and elevated left atrial filling pressure. - Left atrium: The atrium was moderately dilated.  Cath12/09/2017: RPDA lesion is 75% stenosed.  Post Atrio lesion is 50% stenosed.  Prox LAD lesion is 90% stenosed.  Dist LAD lesion is 90% stenosed.  Ost 1st Diag to 1st Diag lesion is 90% stenosed.  Ost 1st Mrg to 1st Mrg lesion is 80% stenosed.  Prox LAD to Mid LAD lesion is 40% stenosed.  There is moderate to severe left ventricular systolic dysfunction.  LV end diastolic pressure is normal.  There is no mitral valve regurgitation.  There is no aortic valve stenosis.  1. Severe 3 vessel obstructive CAD. 2. Moderate to severe LV dysfunction. EF 30-35% 3. Normal LV filling pressures 4. Normal right heart pressures. 5. Normal  cardiac output.   Plan: patient is diabetic with LV dysfunction and 3 vessel CAD. Recommend surgical consultation for revascularization. If she is not felt to be a surgical candidate we could treat the proximal LAD +/- distal LAD with PCI. The other vessels are not suitable PCI targets.    Cath12/10/2017:  Prox LAD lesion is 90% stenosed.  A drug-eluting stent was successfully placed using a STENT SYNERGY DES 3X38.  Post intervention, there is 0% residual stenosis.  Dist LAD lesion is 90% stenosed.  A drug-eluting stent was successfully placed using a STENT SYNERGY DES 2.5X16.  Post intervention, there is a 0% residual stenosis.  1. Successful PCI of the proximal and distal LAD with DES x 2.  Plan: since patient is ASA allergic I would recommend Brilinta monotherapy 90 mg bid for one year then 60 mg bid long term. Patient is a candidate for discharge  from a cardiac standpoint tomorrow. Her residual CAD will be treated medically.   Patient Profile     67 y.o. female with recently diagnosed left ventricular systolic dysfunction and multivessel CAD, status post 2 drug-eluting stents to the LAD artery on 12/30/2017, with residual stenoses in the diagonal artery and oblique marginal branch, not amenable to PCI who returned with shortness of breath at rest, despite the absence of edema and substantial weight gain  Assessment & Plan    1.  Resting dyspnea with hx of systolic HF with an LVEF of 30-35%: -Patient presented with resting shortness of breath despite absence of edema or substantial weight gain. She was recently diagnosed with LV systolic dysfunction and multivessel CAD s/p 2 DES stents to the LAD on 12/30/2017 with residual stenosis in the diagonal artery and OM branch 9 amenable to PCI>>not felt to be a surgical candidate  -Trop, negative this admission  -Per chart review, dyspnea possibly in the setting of treatment with Brilinta.  -Pt underwent PFT's and 6 minute walk  test with pending final results however pt was symptomatic with testing.  -Continue carvedilol  -Pt transitioned to Effient>>reloaded with 60mg  yesterday and will start 10mg  daily today -Reports SOB is improved  -Weight, 151lb today>weight yesterday 156lb -I&O, net positive 1.5L  2.  CAD s/p recent DES to LADx2 10/2017: -Denies anginal symptoms -See cath reports above -Continue carvedilol   -Continue Effient for AC>>>no ASA secondary to allergy   3.  HLD: -Stable, on high-dose statin  4. Nausea/vomintg: -Reports several episodes of vomiting yesterday afternoon>>resolved now -Low grade 99.5 temp this AM, 101.3 yesterday afternoon  -WBC stable at 9.4 -Per IM  5. AKI: -Creatinine, 2.27 today, up from 2.08 yesterday likely in the setting of vomiting -Currently being hydrated with 31ml/hr NS    Signed, Kathyrn Drown NP-C [place] Pager: [phone] 01/20/2018, 8:52 AM     For questions or updates, please contact   Please consult [email or web address] for contact info under Cardiology/STEMI.

## 2018-01-20 NOTE — Progress Notes (Signed)
Patient asked for purse to put jewelry in, RN gave patient her purse and she put rings in purse. Patient later admits she took some medicine that was in her purse. The medication ws oxycodone-acetaminophen 10-325. RN retrieved meds and will take to pharmacy. Patient states she took 1 pill.

## 2018-01-20 NOTE — Progress Notes (Addendum)
Pt more alert this afternoon and more oriented "I dont even remember coming in here" pt says her nausea is improved and feeling more like herself, pt was very weak and drowsy this am and now able to walk in hallway but only 41ft as she says her legs are weak.Pt says she cant take artifical sweetners, reviewed allergies and says they cause nausea and pscyhosis, her hands are swollen and she says that will happen as well. Will avoid artificial sweetners

## 2018-01-20 NOTE — Progress Notes (Signed)
Pt was assisted to bathroom, still weak on legs, " what is wrong?i  Feel like I amat the [facility]" . [facility] am I at [facility]? What ward am on" oriented pt to 3e and more confused, dtr called while in room and she spoke to another nurse who helped get to bed, she asked to get update on mother, paged K Black mid lvel to call

## 2018-01-20 NOTE — Progress Notes (Signed)
[facility] PROGRESS NOTE  Andrea [name] QIO:[id number] DOB: 1950/09/17 DOA: 01/17/2018 PCP: Medicine, [facility]  Assessment/Plan:   1. CP/shortness of breath- 1. CP obs pathwayruled out for MI, denies CP this am 2. Cards evaluatingthey do not think she has angina ;shortness of breath may be related to her Brilinta 3. Serial tropsnegative 4. No events on tele 5. Changed Brilinta to Effient 6. PFTs noted. Of note patient treated with methotrexate for her breast cancer approximately 3 years ago.   2.   HTN -fair control 1. Holding entresto; consider restarting once creatinine improved 2. Continue coreg 3. monitor 3. AKI -creatinine trending up. Likely related to decreased oral intake. 1. Hold Entresto 2. Hold diuretics 3. Repeat BMP in AM 4. Monitor urine outpu 5. Gentle IV fluids 4. DM2 - 1. SSI moderate scale AC/HS 2. Fair control 5. Chronic pain - continue home narcotics and lyrica with adjustments noted 6. Acute respiratory failure with hypoxia. Oxygen saturation level 88% on room air. Etiology unclear. Fever of 101.3 mild cough. Concern for pna given immobility/encephalopathy in setting of polypharmacy. Will provide oxygen supplementation, obtain chest xray, nebs. Will check urinalysis as well.  7. Acute encephalopathy: likely related to sedating meds but cannot rule out infectious process with fever and cough. Neuro exam benign, no metabolic derangements. Will obtain urinalysis, chest xray, B12, folate and CBC with bmet. Gentle IV fluids. Monitor closely. If no improvement consider ABG 8. High risk medication use/polypharmacy: quite lethargic. Home meds include multiple medications including Xanax, Phenergan, MS Contin, Percocet, oxycodone, and Lyrica that were adjusted yesterday. Xanax dose decreased and frequency to 0.25 mg 3 times daily as needed,  oxycodone  discontinued but continued the Percocet. Lyrica and phenergan discontinued. Will  further decrease Xanax and MS contin. 9. Constipation. Per CT related to meds. Bowel regimen started yesterday  Code Status: full Family Communication: none present Disposition Plan: home when ready   Consultants:  none  Procedures:    Antibiotics:    HPI/Subjective: Lying in bed eyes closed. Will respond to verbal stimuli and follows simple commands. Difficulty staying awake  Andrea [name] a 67 y.o.femalewith medical history significant ofDM2, HTN. Patient was admitted to hospital with CP / CHF symptoms earlier this month. Found to have EF 30-35%, severe 3 vessel dz on Cath 12/9. Evaluated by CVTS but not felt to be great candidate for CABG. Underwent 2X DES to prox and distal LAD on 12/10. CP improved some though she states SOB has persisted. She was also put on spironolactone, lasix, and entresto. Seen again in hospital 12/15-12/16 for CP, down trending trop. ACS ruled out but remains sob and encephalopathic. Concern for polypharmacy  Objective: Vitals:   01/20/18 0347 01/20/18 0758  BP: 139/64 137/66  Pulse: 91 91  Resp: 18 16  Temp: 98.7 F (37.1 C) 99.5 F (37.5 C)  SpO2: 96% 96%    Intake/Output Summary (Last 24 hours) at 01/20/2018 3244 Last data filed at 01/19/2018 2317 Gross per 24 hour  Intake 480 ml  Output 700 ml  Net -220 ml   Filed Weights   01/18/18 0800 01/19/18 0611 01/20/18 0347  Weight: 69.4 kg 70.9 kg 68.5 kg    Exam:   General:  Somewhat pale, lethargic in no acute distress  Cardiovascular: rrr no mgr no LE edema  Respiratory: respers slightly shallow, fair air movement no wheeze or rhonchi  Abdomen: non-distended non-tender +BS but sluggish  Musculoskeletal: moves all extremities joints without swelling/erythema  Neuro: lethargic speech  clear but slow follows simple commands slowly oriented to self and place tongue midline bilateral grip 5/5   Data Reviewed: Basic Metabolic Panel: Recent Labs  Lab  01/17/18 1718 01/18/18 0417 01/19/18 0741  NA 138 141 142  K 4.7 3.8 4.5  CL 97* 101 102  CO2 28 28 27   GLUCOSE 226* 153* 137*  BUN 33* 29* 36*  CREATININE 1.73* 1.52* 2.08*  CALCIUM 9.2 8.4* 8.4*  MG  --   --  1.5*   Liver Function Tests: No results for input(s): AST, ALT, ALKPHOS, BILITOT, PROT, ALBUMIN in the last 168 hours. No results for input(s): LIPASE, AMYLASE in the last 168 hours. No results for input(s): AMMONIA in the last 168 hours. CBC: Recent Labs  Lab 01/17/18 1718 01/19/18 0741  WBC 11.9* 8.6  HGB 11.3* 10.3*  HCT 33.8* 31.9*  MCV 90.4 89.6  PLT 300 247   Cardiac Enzymes: Recent Labs  Lab 01/17/18 2243 01/18/18 0116 01/18/18 0417  TROPONINI <0.03 <0.03 <0.03   BNP (last 3 results) Recent Labs    12/26/17 0504 01/04/18 1327 01/17/18 1715  BNP 714.4* 333.2* 267.6*    ProBNP (last 3 results) No results for input(s): PROBNP in the last 8760 hours.  CBG: Recent Labs  Lab 01/19/18 0804 01/19/18 1132 01/19/18 1655 01/19/18 2114 01/20/18 0736  GLUCAP 138* 113* 140* 116* 107*    No results found for this or any previous visit (from the past 240 hour(s)).   Studies: Dg Abd 1 View  Result Date: 01/19/2018 CLINICAL DATA:  Nausea and vomiting for the past day. EXAM: ABDOMEN - 1 VIEW COMPARISON:  CT abdomen pelvis dated Jun 01, 2017. FINDINGS: The bowel gas pattern is normal. Large amount of stool in the right colon. No radio-opaque calculi or other significant radiographic abnormality are seen. Contrast within the bladder. Prior cholecystectomy. No acute osseous abnormality. IMPRESSION: 1. Large amount of stool in the right colon, suggestive of constipation. No obstruction. Electronically Signed   By: Titus Dubin M.D.   On: 01/19/2018 16:48   Dg Chest Port 1 View  Result Date: 01/20/2018 CLINICAL DATA:  Fever, nausea, and vomiting. History of breast malignancy with bilateral mastectomy, diabetes. EXAM: PORTABLE CHEST 1 VIEW COMPARISON:   Portable chest x-ray of January 17, 2018 and chest CT scan of the same day. FINDINGS: The lungs are adequately inflated and clear. The heart and pulmonary vascularity are normal. The mediastinum is normal in width. There is no pleural effusion. The bony thorax is unremarkable. IMPRESSION: There is no active cardiopulmonary disease. Electronically Signed   By: David  Martinique M.D.   On: 01/20/2018 08:00    Scheduled Meds: . atorvastatin  80 mg Oral QHS  . carvedilol  6.25 mg Oral BID WC  . divalproex  1,000 mg Oral QHS  . enoxaparin (LOVENOX) injection  40 mg Subcutaneous QHS  . insulin aspart  0-15 Units Subcutaneous TID WC  . insulin aspart  0-5 Units Subcutaneous QHS  . levothyroxine  200 mcg Oral Q0600  . morphine  30 mg Oral Q12H  . polyethylene glycol  17 g Oral Daily  . prasugrel  10 mg Oral Daily  . senna-docusate  2 tablet Oral BID   Continuous Infusions: . sodium chloride 75 mL/hr at 01/20/18 0750    Principal Problem:   Chest pain, rule out acute myocardial infarction Active Problems:   AKI (acute kidney injury) ([place])   High risk medication use   Acute respiratory failure with hypoxia ([place])  Acute encephalopathy   Hypertension   Insulin dependent diabetes mellitus (HCC)   Chronic systolic heart failure (HCC)   Constipation   Chronic pain    Time spent: 45 minutes    [place] NP [facility]  If 7PM-7AM, please contact night-coverage at [email or web address], password [facility] 01/20/2018, 8:11 AM  LOS: 0 days

## 2018-01-21 DIAGNOSIS — I2510 Atherosclerotic heart disease of native coronary artery without angina pectoris: Secondary | ICD-10-CM | POA: Diagnosis not present

## 2018-01-21 DIAGNOSIS — R079 Chest pain, unspecified: Secondary | ICD-10-CM | POA: Diagnosis not present

## 2018-01-21 DIAGNOSIS — I255 Ischemic cardiomyopathy: Secondary | ICD-10-CM | POA: Diagnosis not present

## 2018-01-21 DIAGNOSIS — K5903 Drug induced constipation: Secondary | ICD-10-CM

## 2018-01-21 DIAGNOSIS — G9340 Encephalopathy, unspecified: Secondary | ICD-10-CM | POA: Diagnosis not present

## 2018-01-21 DIAGNOSIS — G8929 Other chronic pain: Secondary | ICD-10-CM

## 2018-01-21 DIAGNOSIS — I5022 Chronic systolic (congestive) heart failure: Secondary | ICD-10-CM | POA: Diagnosis not present

## 2018-01-21 DIAGNOSIS — N179 Acute kidney failure, unspecified: Secondary | ICD-10-CM | POA: Diagnosis not present

## 2018-01-21 LAB — BASIC METABOLIC PANEL
Anion gap: 8 (ref 5–15)
BUN: 37 mg/dL — ABNORMAL HIGH (ref 8–23)
CO2: 31 mmol/L (ref 22–32)
Calcium: 8.5 mg/dL — ABNORMAL LOW (ref 8.9–10.3)
Chloride: 99 mmol/L (ref 98–111)
Creatinine, Ser: 1.68 mg/dL — ABNORMAL HIGH (ref 0.44–1.00)
GFR calc Af Amer: 36 mL/min — ABNORMAL LOW (ref >60)
GFR calc non Af Amer: 31 mL/min — ABNORMAL LOW (ref >60)
Glucose, Bld: 159 mg/dL — ABNORMAL HIGH (ref 70–99)
Potassium: 4.4 mmol/L (ref 3.5–5.1)
Sodium: 138 mmol/L (ref 135–145)

## 2018-01-21 LAB — CBC
HCT: 30.1 % — ABNORMAL LOW (ref 36.0–46.0)
Hemoglobin: 9.6 g/dL — ABNORMAL LOW (ref 12.0–15.0)
MCH: 28.4 pg (ref 26.0–34.0)
MCHC: 31.9 g/dL (ref 30.0–36.0)
MCV: 89.1 fL (ref 80.0–100.0)
Platelets: 238 10*3/uL (ref 150–400)
RBC: 3.38 MIL/uL — ABNORMAL LOW (ref 3.87–5.11)
RDW: 12.2 % (ref 11.5–15.5)
WBC: 8.4 10*3/uL (ref 4.0–10.5)
nRBC: 0 % (ref 0.0–0.2)

## 2018-01-21 LAB — GLUCOSE, CAPILLARY
Glucose-Capillary: 138 mg/dL — ABNORMAL HIGH (ref 70–99)
Glucose-Capillary: 169 mg/dL — ABNORMAL HIGH (ref 70–99)
Glucose-Capillary: 124 mg/dL — ABNORMAL HIGH (ref 70–99)
Glucose-Capillary: 143 mg/dL — ABNORMAL HIGH (ref 70–99)

## 2018-01-21 MED ORDER — ENOXAPARIN SODIUM 40 MG/0.4ML ~~LOC~~ SOLN
40.0000 mg | Freq: Every day | SUBCUTANEOUS | Status: DC
  Administered 2018-01-21: 40 mg via SUBCUTANEOUS
  Filled 2018-01-21: qty 0.4

## 2018-01-21 MED ORDER — ALPRAZOLAM 0.5 MG PO TABS
0.5000 mg | ORAL_TABLET | Freq: Three times a day (TID) | ORAL | Status: DC | PRN
  Administered 2018-01-21 – 2018-01-22 (×3): 0.5 mg via ORAL
  Filled 2018-01-21 (×3): qty 1

## 2018-01-21 NOTE — Plan of Care (Signed)
  Problem: Education: Goal: Knowledge of General Education information will improve Description: Including pain rating scale, medication(s)/side effects and non-pharmacologic comfort measures Outcome: Progressing   Problem: Health Behavior/Discharge Planning: Goal: Ability to manage health-related needs will improve Outcome: Progressing   Problem: Pain Managment: Goal: General experience of comfort will improve Outcome: Progressing   

## 2018-01-21 NOTE — Care Management Obs Status (Signed)
[facility] NOTIFICATION   Patient Details  Name: Andrea [name] MRN: [id number] Date of Birth: [birth year]   Medicare Observation Status Notification Given:  Other (see comment)(Pt is unstable at this time having chest pain)    Royston Bake, RN 01/21/2018, 10:49 AM

## 2018-01-21 NOTE — Progress Notes (Signed)
Progress Note  Patient Name: Andrea [name] Date of Encounter: 01/21/2018  Primary Cardiologist: Kirk Ruths, MD   Subjective   Continues with chest heaviness (continuous since admission); mild dyspnea; "stressed".  Inpatient Medications    Scheduled Meds: . atorvastatin  80 mg Oral QHS  . carvedilol  6.25 mg Oral BID WC  . divalproex  1,000 mg Oral QHS  . enoxaparin (LOVENOX) injection  30 mg Subcutaneous QHS  . insulin aspart  0-15 Units Subcutaneous TID WC  . insulin aspart  0-5 Units Subcutaneous QHS  . levothyroxine  200 mcg Oral Q0600  . morphine  15 mg Oral Q12H  . polyethylene glycol  17 g Oral Daily  . prasugrel  10 mg Oral Daily  . senna-docusate  2 tablet Oral BID   Continuous Infusions:  PRN Meds: acetaminophen, ALPRAZolam, diphenhydrAMINE-zinc acetate, ondansetron (ZOFRAN) IV, oxyCODONE-acetaminophen **AND** [DISCONTINUED] oxyCODONE   Vital Signs    Vitals:   01/20/18 1741 01/20/18 1750 01/20/18 2151 01/21/18 0511  BP: (!) 167/78 136/72 (!) 176/78 (!) 173/69  Pulse: 89 92 81 85  Resp: 18  18 18   Temp: 98.2 F (36.8 C)  98 F (36.7 C) 98.3 F (36.8 C)  TempSrc: Oral  Oral Oral  SpO2: 97%  98% 100%  Weight:    70 kg  Height:        Intake/Output Summary (Last 24 hours) at 01/21/2018 0850 Last data filed at 01/21/2018 1610 Gross per 24 hour  Intake 1847.14 ml  Output 1250 ml  Net 597.14 ml   Filed Weights   01/19/18 0611 01/20/18 0347 01/21/18 0511  Weight: 70.9 kg 68.5 kg 70 kg    Telemetry    Sinus - Personally Reviewed   Physical Exam   GEN: No acute distress.   Neck: No JVD Cardiac: RRR, no murmurs, rubs, or gallops.  Respiratory: Clear to auscultation bilaterally. GI: Soft, nontender, non-distended  MS: No edema Neuro:  Nonfocal  Psych: Normal affect   Labs    Chemistry Recent Labs  Lab 01/19/18 0741 01/20/18 0739 01/21/18 0411  NA 142 141 138  K 4.5 4.5 4.4  CL 102 102 99  CO2 27 27 31   GLUCOSE 137* 129*  159*  BUN 36* 37* 37*  CREATININE 2.08* 2.27* 1.68*  CALCIUM 8.4* 8.7* 8.5*  GFRNONAA 24* 22* 31*  GFRAA 28* 25* 36*  ANIONGAP 13 12 8      Hematology Recent Labs  Lab 01/19/18 0741 01/20/18 0739 01/21/18 0411  WBC 8.6 9.4 8.4  RBC 3.56* 3.61* 3.38*  HGB 10.3* 10.4* 9.6*  HCT 31.9* 32.7* 30.1*  MCV 89.6 90.6 89.1  MCH 28.9 28.8 28.4  MCHC 32.3 31.8 31.9  RDW 12.3 12.2 12.2  PLT 247 292 238    Cardiac Enzymes Recent Labs  Lab 01/17/18 2243 01/18/18 0116 01/18/18 0417  TROPONINI <0.03 <0.03 <0.03    Recent Labs  Lab 01/17/18 1716  TROPIPOC 0.01     BNP Recent Labs  Lab 01/17/18 1715  BNP 267.6*      Radiology    Dg Abd 1 View  Result Date: 01/19/2018 CLINICAL DATA:  Nausea and vomiting for the past day. EXAM: ABDOMEN - 1 VIEW COMPARISON:  CT abdomen pelvis dated Jun 01, 2017. FINDINGS: The bowel gas pattern is normal. Large amount of stool in the right colon. No radio-opaque calculi or other significant radiographic abnormality are seen. Contrast within the bladder. Prior cholecystectomy. No acute osseous abnormality. IMPRESSION: 1. Large amount of stool  in the right colon, suggestive of constipation. No obstruction. Electronically Signed   By: Titus Dubin M.D.   On: 01/19/2018 16:48   Dg Chest Port 1 View  Result Date: 01/20/2018 CLINICAL DATA:  Fever, nausea, and vomiting. History of breast malignancy with bilateral mastectomy, diabetes. EXAM: PORTABLE CHEST 1 VIEW COMPARISON:  Portable chest x-ray of January 17, 2018 and chest CT scan of the same day. FINDINGS: The lungs are adequately inflated and clear. The heart and pulmonary vascularity are normal. The mediastinum is normal in width. There is no pleural effusion. The bony thorax is unremarkable. IMPRESSION: There is no active cardiopulmonary disease. Electronically Signed   By: David  Martinique M.D.   On: 01/20/2018 08:00    Patient Profile     68 y.o. female with recently diagnosed left ventricular  systolic dysfunction and multivessel CAD, status post 2 drug-eluting stents to the LAD artery on 12/30/2017, with residual stenoses in the diagonal artery and oblique marginal branch, not amenable to PCIwho returned withshortness of breath at rest, despite the absence of edema and substantial weightgain  Assessment & Plan    1 coronary artery disease status post recent PCI-patient complained of dyspnea at time of admission.  Brilinta has been discontinued.  Continue Effient.  Allergy to aspirin.  Continue Lipitor.  2 chest pain-patient complains of chest heaviness that she feels is worsened by anxiety and stress.  She states this has been continuous since admission.  Enzymes negative.  No plans for further ischemia evaluation.  3 acute kidney injury-this is felt secondary to overdiuresis with component of contrast nephropathy as well.  Renal function improving.  Continue to hold diuretics and Entresto for now.  4 ischemic cardiomyopathy-continue beta-blocker.  Would resume Entresto once renal function returns to baseline.  Will need repeat echocardiogram in 3 months.  If ejection fraction less than 35% would need to consider ICD.  5 history of chronic systolic congestive heart failure-patient appeared to be over diuresed at time of admission.  She is not volume overloaded on examination.  Continue to hold diuretics.  May need lower dose at discharge.  However some of her dyspnea is likely secondary to anxiety.  6 anxiety/chronic pain-management per primary care.  7 hypertension-blood pressure is elevated.  Will resume Entresto once renal function stabilizes.  For questions or updates, please contact [place] Please consult [email or web address] for contact info under        Signed, Kirk Ruths, MD  01/21/2018, 8:50 AM

## 2018-01-21 NOTE — Progress Notes (Signed)
Patient requested to speak with MD about anxiety, MD paged and she will adjust meds accordingly Neta Mends RN 3:40 PM 01-21-2018

## 2018-01-21 NOTE — Progress Notes (Signed)
Progress Note    Andrea [name]  RUE:[id number] DOB: [birth year]  DOA: 01/17/2018 PCP: Medicine, [facility]    Brief Narrative:     Medical records reviewed and are as summarized below:  Andrea [name] is an 68 y.o. female with medical history significant of DM2, HTN.  Patient was admitted to hospital with CP / CHF symptoms earlier this month.  Found to have EF 30-35%, severe 3 vessel dz on Cath 12/9.  Evaluated by CVTS but not felt to be great candidate for CABG.  Underwent 2X DES to prox and distal LAD on 12/10.  CP improved some though she states SOB has persisted.  was put on spironolactone, lasix, and entresto. Seen again in hospital 12/15-12/16 for CP, down trending trop. Returns to ED  with ongoing SOB and more CP.  CP is L sided, radiates to L arm and Jaw.  Assessment/Plan:   Principal Problem:   Chest pain, rule out acute myocardial infarction Active Problems:   Hypertension   Insulin dependent diabetes mellitus (HCC)   Chronic pain   AKI (acute kidney injury) ([place])   Chronic systolic heart failure (HCC)   High risk medication use   Acute respiratory failure with hypoxia (HCC)   Acute encephalopathy   Constipation  CP/shortness of breath- -recent cath  -Cards evaluatingthey donot think she has angina ;shortness of breath may be related to her Brilinta-- this has been changed to effient   Chronic systolic CHF -last EF around 30-35% -entresto on hold for AKI -may need decreased amount upon d/c  AKI (baseline CR <1) Hold Entresto Hold diuretics IV not good, so unable to do IVF -recheck BMP in AM  DM2 - SSI moderate scale AC/HS Fair control  Chronic pain  - continue home narcotics with adjustments noted  Acute respiratory failure with hypoxia.  Per documentationOxygen saturation level 88% on room air -home O2 study -xray negative for PNA   High risk medication use/polypharmacy:  -quite lethargic on 12/31 per  NP -Home meds include multiple medications including Xanax, Phenergan, MS Contin, Percocet, oxycodone, and Lyrica that were adjusted yesterday. Xanax dose decreased and frequency to 0.25 mg 3 times daily as needed,  oxycodone  discontinued but continued the Percocet. Lyrica and phenergan discontinued.   Constipation.  -related to chronic narcotics -bowel regimen   Family Communication/Anticipated D/C date and plan/Code Status   DVT prophylaxis: Lovenox ordered. Code Status: Full Code.  Family Communication: none at bedside Disposition Plan: home once CR stable and medications decided   Medical Consultants:   cards   Subjective:   Asking about her pain medications  Objective:    Vitals:   01/20/18 1741 01/20/18 1750 01/20/18 2151 01/21/18 0511  BP: (!) 167/78 136/72 (!) 176/78 (!) 173/69  Pulse: 89 92 81 85  Resp: 18  18 18   Temp: 98.2 F (36.8 C)  98 F (36.7 C) 98.3 F (36.8 C)  TempSrc: Oral  Oral Oral  SpO2: 97%  98% 100%  Weight:    70 kg  Height:        Intake/Output Summary (Last 24 hours) at 01/21/2018 1241 Last data filed at 01/21/2018 0951 Gross per 24 hour  Intake 1642.15 ml  Output 1150 ml  Net 492.15 ml   Filed Weights   01/19/18 0611 01/20/18 0347 01/21/18 0511  Weight: 70.9 kg 68.5 kg 70 kg    Exam: response seems delayed rrr No wheezing, no increased work of breathing, poor effort No LE edema  A+Ox3   Data Reviewed:   I have personally reviewed following labs and imaging studies:  Labs: Labs show the following:   Basic Metabolic Panel: Recent Labs  Lab 01/17/18 1718 01/18/18 0417 01/19/18 0741 01/20/18 0739 01/21/18 0411  NA 138 141 142 141 138  K 4.7 3.8 4.5 4.5 4.4  CL 97* 101 102 102 99  CO2 28 28 27 27 31   GLUCOSE 226* 153* 137* 129* 159*  BUN 33* 29* 36* 37* 37*  CREATININE 1.73* 1.52* 2.08* 2.27* 1.68*  CALCIUM 9.2 8.4* 8.4* 8.7* 8.5*  MG  --   --  1.5*  --   --    GFR Estimated Creatinine Clearance: 32.3 mL/min  (A) (by C-G formula based on SCr of 1.68 mg/dL (H)). Liver Function Tests: No results for input(s): AST, ALT, ALKPHOS, BILITOT, PROT, ALBUMIN in the last 168 hours. No results for input(s): LIPASE, AMYLASE in the last 168 hours. No results for input(s): AMMONIA in the last 168 hours. Coagulation profile No results for input(s): INR, PROTIME in the last 168 hours.  CBC: Recent Labs  Lab 01/17/18 1718 01/19/18 0741 01/20/18 0739 01/21/18 0411  WBC 11.9* 8.6 9.4 8.4  HGB 11.3* 10.3* 10.4* 9.6*  HCT 33.8* 31.9* 32.7* 30.1*  MCV 90.4 89.6 90.6 89.1  PLT 300 247 292 238   Cardiac Enzymes: Recent Labs  Lab 01/17/18 2243 01/18/18 0116 01/18/18 0417  TROPONINI <0.03 <0.03 <0.03   BNP (last 3 results) No results for input(s): PROBNP in the last 8760 hours. CBG: Recent Labs  Lab 01/20/18 0736 01/20/18 1152 01/20/18 1604 01/20/18 2217 01/21/18 0809  GLUCAP 107* 116* 137* 154* 138*   D-Dimer: No results for input(s): DDIMER in the last 72 hours. Hgb A1c: No results for input(s): HGBA1C in the last 72 hours. Lipid Profile: No results for input(s): CHOL, HDL, LDLCALC, TRIG, CHOLHDL, LDLDIRECT in the last 72 hours. Thyroid function studies: No results for input(s): TSH, T4TOTAL, T3FREE, THYROIDAB in the last 72 hours.  Invalid input(s): FREET3 Anemia work up: Recent Labs    01/20/18 [place]   Sepsis Labs: Recent Labs  Lab 01/17/18 1718 01/19/18 0741 01/20/18 0739 01/21/18 0411  WBC 11.9* 8.6 9.4 8.4    Microbiology No results found for this or any previous visit (from the past 240 hour(s)).  Procedures and diagnostic studies:  Dg Abd 1 View  Result Date: 01/19/2018 CLINICAL DATA:  Nausea and vomiting for the past day. EXAM: ABDOMEN - 1 VIEW COMPARISON:  CT abdomen pelvis dated Jun 01, 2017. FINDINGS: The bowel gas pattern is normal. Large amount of stool in the right colon. No radio-opaque calculi or other significant radiographic abnormality  are seen. Contrast within the bladder. Prior cholecystectomy. No acute osseous abnormality. IMPRESSION: 1. Large amount of stool in the right colon, suggestive of constipation. No obstruction. Electronically Signed   By: Titus Dubin M.D.   On: 01/19/2018 16:48   Dg Chest Port 1 View  Result Date: 01/20/2018 CLINICAL DATA:  Fever, nausea, and vomiting. History of breast malignancy with bilateral mastectomy, diabetes. EXAM: PORTABLE CHEST 1 VIEW COMPARISON:  Portable chest x-ray of January 17, 2018 and chest CT scan of the same day. FINDINGS: The lungs are adequately inflated and clear. The heart and pulmonary vascularity are normal. The mediastinum is normal in width. There is no pleural effusion. The bony thorax is unremarkable. IMPRESSION: There is no active cardiopulmonary disease. Electronically Signed   By: David  Martinique M.D.  On: 01/20/2018 08:00    Medications:   . atorvastatin  80 mg Oral QHS  . carvedilol  6.25 mg Oral BID WC  . divalproex  1,000 mg Oral QHS  . enoxaparin (LOVENOX) injection  30 mg Subcutaneous QHS  . insulin aspart  0-15 Units Subcutaneous TID WC  . insulin aspart  0-5 Units Subcutaneous QHS  . levothyroxine  200 mcg Oral Q0600  . morphine  15 mg Oral Q12H  . polyethylene glycol  17 g Oral Daily  . prasugrel  10 mg Oral Daily  . senna-docusate  2 tablet Oral BID   Continuous Infusions:   LOS: 0 days   Geradine Girt  [facility]   *Please refer to [email or web address], password [email or web address] to get updated schedule on who will round on this patient, as hospitalists switch teams weekly. If 7PM-7AM, please contact night-coverage at [email or web address], password [email or web address] for any overnight needs.  01/21/2018, 12:41 PM

## 2018-01-22 ENCOUNTER — Encounter (HOSPITAL_COMMUNITY): Payer: Medicare Other

## 2018-01-22 ENCOUNTER — Telehealth: Payer: Self-pay | Admitting: Cardiology

## 2018-01-22 DIAGNOSIS — N179 Acute kidney failure, unspecified: Secondary | ICD-10-CM | POA: Diagnosis not present

## 2018-01-22 DIAGNOSIS — R079 Chest pain, unspecified: Secondary | ICD-10-CM | POA: Diagnosis not present

## 2018-01-22 DIAGNOSIS — I5022 Chronic systolic (congestive) heart failure: Secondary | ICD-10-CM | POA: Diagnosis not present

## 2018-01-22 DIAGNOSIS — I10 Essential (primary) hypertension: Secondary | ICD-10-CM | POA: Diagnosis not present

## 2018-01-22 DIAGNOSIS — G8929 Other chronic pain: Secondary | ICD-10-CM | POA: Diagnosis not present

## 2018-01-22 DIAGNOSIS — G9340 Encephalopathy, unspecified: Secondary | ICD-10-CM | POA: Diagnosis not present

## 2018-01-22 DIAGNOSIS — I2583 Coronary atherosclerosis due to lipid rich plaque: Secondary | ICD-10-CM | POA: Diagnosis not present

## 2018-01-22 DIAGNOSIS — I2510 Atherosclerotic heart disease of native coronary artery without angina pectoris: Secondary | ICD-10-CM | POA: Diagnosis not present

## 2018-01-22 LAB — FOLATE RBC
Folate, Hemolysate: 324.4 ng/mL
Folate, RBC: 1040 ng/mL (ref >498)
Hematocrit: 31.2 % — ABNORMAL LOW (ref 34.0–46.6)

## 2018-01-22 LAB — CBC
HCT: 31.6 % — ABNORMAL LOW (ref 36.0–46.0)
Hemoglobin: 10.4 g/dL — ABNORMAL LOW (ref 12.0–15.0)
MCH: 28.7 pg (ref 26.0–34.0)
MCHC: 32.9 g/dL (ref 30.0–36.0)
MCV: 87.3 fL (ref 80.0–100.0)
Platelets: 219 10*3/uL (ref 150–400)
RBC: 3.62 MIL/uL — ABNORMAL LOW (ref 3.87–5.11)
RDW: 12 % (ref 11.5–15.5)
WBC: 8.3 10*3/uL (ref 4.0–10.5)
nRBC: 0 % (ref 0.0–0.2)

## 2018-01-22 LAB — BASIC METABOLIC PANEL
Anion gap: 12 (ref 5–15)
BUN: 26 mg/dL — ABNORMAL HIGH (ref 8–23)
CO2: 25 mmol/L (ref 22–32)
CREATININE: 1.25 mg/dL — AB (ref 0.44–1.00)
Calcium: 8.8 mg/dL — ABNORMAL LOW (ref 8.9–10.3)
Chloride: 103 mmol/L (ref 98–111)
GFR calc Af Amer: 52 mL/min — ABNORMAL LOW (ref >60)
GFR calc non Af Amer: 45 mL/min — ABNORMAL LOW (ref >60)
GLUCOSE: 110 mg/dL — AB (ref 70–99)
Potassium: 4.1 mmol/L (ref 3.5–5.1)
Sodium: 140 mmol/L (ref 135–145)

## 2018-01-22 LAB — GLUCOSE, CAPILLARY: Glucose-Capillary: 113 mg/dL — ABNORMAL HIGH (ref 70–99)

## 2018-01-22 MED ORDER — SACUBITRIL-VALSARTAN 24-26 MG PO TABS
1.0000 | ORAL_TABLET | Freq: Two times a day (BID) | ORAL | Status: DC
  Administered 2018-01-22: 1 via ORAL
  Filled 2018-01-22: qty 1

## 2018-01-22 MED ORDER — SENNOSIDES-DOCUSATE SODIUM 8.6-50 MG PO TABS: 2.0000 | ORAL_TABLET | Freq: Two times a day (BID) | ORAL | Status: AC

## 2018-01-22 MED ORDER — PRASUGREL HCL 10 MG PO TABS: 10.0000 mg | ORAL_TABLET | Freq: Every day | ORAL | 1 refills | Status: DC

## 2018-01-22 MED FILL — PRASUGREL HCL 10 MG TABS: 10 | 30 days supply | Qty: 30 | Fill #0

## 2018-01-22 NOTE — Care Management (Signed)
#    3.   S/W CHERYL @ [facility] # [id number]  BRAND :  1.   EFFIENT  10 MG  DAILY   COVER- YES CO-PAY- $ 64.00 TIER- 3 DRUG PRIOR APPROVAL- NO  GENERIC: 2. PRASUGREL   10 MG DAILY COVER- YES CO-PAY- $ 10.00 TIER-  1 DRUG PRIOR APPROVAL-NO  INITIAL COVERAGE PHASE  PREFERRED PHARMACY : YES [facility] AND  [facility]

## 2018-01-22 NOTE — Telephone Encounter (Signed)
New message     1/14 9:30 k lawrence TOC

## 2018-01-22 NOTE — Care Management Note (Signed)
Case Management Note  Patient Details  Name: Araceli Arango MRN: [id number] Date of Birth: 06-03-1950  Subjective/Objective:    Chest Pain               Action/Plan: Patient lives alone, continue to drive and is independent of all of her ADL; PCP - [facility]' has private insurance with [facility] with prescription drug coverage; [facility]; no needs identified at this time.  Expected Discharge Date:    01/22/2018              Expected Discharge Plan:  Home/Self Care  Discharge planning Services  CM Consult  Status of Service:  In process, will continue to follow  Sherrilyn Rist [phone] 01/22/2018, 10:22 AM

## 2018-01-22 NOTE — Telephone Encounter (Signed)
Currently admitted.

## 2018-01-22 NOTE — Discharge Summary (Signed)
Physician Discharge Summary  Andrea [name] ONG:[id number] DOB: May 11, 1950 DOA: 01/17/2018  PCP: Medicine, [place] date: 01/17/2018 Discharge date: 01/22/2018  Admitted From: home Discharge disposition: home   Recommendations for Outpatient Follow-Up:   1. BMP 1 week-- lasix and aldactone on hold 2. Consider reduction in sedating medications   Discharge Diagnosis:   Principal Problem:   Chest pain, rule out acute myocardial infarction Active Problems:   Hypertension   Insulin dependent diabetes mellitus (HCC)   Chronic pain   AKI (acute kidney injury) ([place])   Chronic systolic heart failure (HCC)   High risk medication use   Acute respiratory failure with hypoxia ([place])   Acute encephalopathy   Constipation    Discharge Condition: Improved.  Diet recommendation: Low sodium, heart healthy.  Carbohydrate-modified.  Wound care: None.  Code status: Full.   History of Present Illness:   Andrea [name] is a 68 y.o. female with medical history significant of DM2, HTN.  Patient was admitted to hospital with CP / CHF symptoms earlier this month.  Found to have EF 30-35%, severe 3 vessel dz on Cath 12/9.  Evaluated by CVTS but not felt to be great candidate for CABG.  Underwent 2X DES to prox and distal LAD on 12/10.  CP improved some though she states SOB has persisted.  Looks like she was also put on spironolactone, lasix, and entresto.  Seen again in hospital 12/15-12/16 for CP, down trending trop.  Returns to ED today with ongoing SOB and more CP.  CP is L sided, radiates to L arm and Jaw.  Does have some mild cough.  No wheezing.  Does have DOE.  No peripheral edema.  No fever.   Hospital Course by Problem:    CP/shortness of breath- -recent cath  -Cards evaluatingthey donot think she has angina ;shortness of breath may be related to her Brilinta-- this has been changed to effient -suspect deconditioning  playing a role as well  Chronic systolic CHF -last EF around 30-35% -per cards: We will continue to hold spironolactone and Lasix until she is seen outpatient.  She needs to be on guideline directed medical therapy for heart failure for 3 months and then repeat echo.  If EF less than 35% will need ICD for primary prevention.  AKI (baseline CR <1) Improved with holding lasix/aldactone -per cards, resume entresto today  DM2 - Resume home meds  Chronic pain  - continue home narcotics- will need reduction  Acute respiratory failure with hypoxia.  -suspect related to her sedation   High risk medication use/polypharmacy: -quite lethargic on 12/31 per NP -Home meds includemultiple medications including Xanax, Phenergan, MS Contin, Percocet, oxycodone, and Lyrica -- will need discussions with outpatient prescriber to reduce these medications--patient reluctant to reduced doses   Constipation.  -related to chronic narcotics -bowel regimen  Hypomagnesemia -repleted  Medical Consultants:    cardiology  Discharge Exam:   Vitals:   01/21/18 1945 01/22/18 0228  BP: (!) 180/64 (!) 126/48  Pulse: 85 86  Resp: 18 18  Temp: 98.3 F (36.8 C) 98.6 F (37 C)  SpO2: 99% 92%   Vitals:   01/21/18 1335 01/21/18 1945 01/22/18 0223 01/22/18 0228  BP: (!) 170/76 (!) 180/64  (!) 126/48  Pulse: 90 85  86  Resp: 20 18  18   Temp: 98.4 F (36.9 C) 98.3 F (36.8 C)  98.6 F (37 C)  TempSrc: Oral Oral  Oral  SpO2: 100% 99%  92%  Weight:   69 kg   Height:        General exam: Appears calm and comfortable.     The results of significant diagnostics from this hospitalization (including imaging, microbiology, ancillary and laboratory) are listed below for reference.     Procedures and Diagnostic Studies:   Ct Angio Chest Pe W And/or Wo Contrast  Result Date: 01/17/2018 CLINICAL DATA:  Chest pain and dizziness.  Shortness of breath. EXAM: CT ANGIOGRAPHY CHEST WITH  CONTRAST TECHNIQUE: Multidetector CT imaging of the chest was performed using the standard protocol during bolus administration of intravenous contrast. Multiplanar CT image reconstructions and MIPs were obtained to evaluate the vascular anatomy. CONTRAST:  177mL ISOVUE-370 IOPAMIDOL (ISOVUE-370) INJECTION 76% COMPARISON:  Chest x-ray January 17, 2018 FINDINGS: Cardiovascular: Satisfactory opacification of the pulmonary arteries to the segmental level. No evidence of pulmonary embolism. Normal heart size. No pericardial effusion. Mediastinum/Nodes: No enlarged mediastinal, hilar, or axillary lymph nodes. Thyroid gland, trachea, and esophagus demonstrate no significant findings. Lungs/Pleura: Mild atelectasis of the posterior lung bases are noted. There is no pleural effusion, pneumothorax or focal pneumonia. Upper Abdomen: Patient status post prior cholecystectomy. The other visualized upper abdominal structures are unremarkable. Musculoskeletal: Mild degenerative joint changes of the spine are noted. Review of the MIP images confirms the above findings. IMPRESSION: No pulmonary embolus. No focal pneumonia. Mild atelectasis of bilateral posterior lung bases. Electronically Signed   By: Abelardo Diesel M.D.   On: 01/17/2018 20:42   Dg Chest Port 1 View  Result Date: 01/17/2018 CLINICAL DATA:  Left-sided and central chest pain x2 weeks. EXAM: PORTABLE CHEST 1 VIEW COMPARISON:  01/04/2018 FINDINGS: The heart size and mediastinal contours are within normal limits. Both lungs are clear. The visualized skeletal structures are unremarkable. IMPRESSION: No active disease. Electronically Signed   By: Ashley Royalty M.D.   On: 01/17/2018 18:28     Labs:   Basic Metabolic Panel: Recent Labs  Lab 01/18/18 0417 01/19/18 0741 01/20/18 0739 01/21/18 0411 01/22/18 0415  NA 141 142 141 138 140  K 3.8 4.5 4.5 4.4 4.1  CL 101 102 102 99 103  CO2 28 27 27 31 25   GLUCOSE 153* 137* 129* 159* 110*  BUN 29* 36* 37* 37*  26*  CREATININE 1.52* 2.08* 2.27* 1.68* 1.25*  CALCIUM 8.4* 8.4* 8.7* 8.5* 8.8*  MG  --  1.5*  --   --   --    GFR Estimated Creatinine Clearance: 43.2 mL/min (A) (by C-G formula based on SCr of 1.25 mg/dL (H)). Liver Function Tests: No results for input(s): AST, ALT, ALKPHOS, BILITOT, PROT, ALBUMIN in the last 168 hours. No results for input(s): LIPASE, AMYLASE in the last 168 hours. No results for input(s): AMMONIA in the last 168 hours. Coagulation profile No results for input(s): INR, PROTIME in the last 168 hours.  CBC: Recent Labs  Lab 01/17/18 1718 01/19/18 0741 01/20/18 0739 01/21/18 0411 01/22/18 0415  WBC 11.9* 8.6 9.4 8.4 8.3  HGB 11.3* 10.3* 10.4* 9.6* 10.4*  HCT 33.8* 31.9* 32.7* 30.1* 31.6*  MCV 90.4 89.6 90.6 89.1 87.3  PLT 300 247 292 238 219   Cardiac Enzymes: Recent Labs  Lab 01/17/18 2243 01/18/18 0116 01/18/18 0417  TROPONINI <0.03 <0.03 <0.03   BNP: Invalid input(s): POCBNP CBG: Recent Labs  Lab 01/21/18 0809 01/21/18 1303 01/21/18 1731 01/21/18 2050 01/22/18 0804  GLUCAP 138* 169* 124* 143* 113*   D-Dimer No results for input(s): DDIMER in the last 72  hours. Hgb A1c No results for input(s): HGBA1C in the last 72 hours. Lipid Profile No results for input(s): CHOL, HDL, LDLCALC, TRIG, CHOLHDL, LDLDIRECT in the last 72 hours. Thyroid function studies No results for input(s): TSH, T4TOTAL, T3FREE, THYROIDAB in the last 72 hours.  Invalid input(s): FREET3 Anemia work up Recent Labs    01/20/18 [place]   Microbiology No results found for this or any previous visit (from the past 240 hour(s)).   Discharge Instructions:   Discharge Instructions    (HEART FAILURE PATIENTS) Call MD:  Anytime you have any of the following symptoms: 1) 3 pound weight gain in 24 hours or 5 pounds in 1 week 2) shortness of breath, with or without a dry hacking cough 3) swelling in the hands, feet or stomach 4) if you have to sleep on extra  pillows at night in order to breathe.   Complete by:  As directed    Diet - low sodium heart healthy   Complete by:  As directed    Diet Carb Modified   Complete by:  As directed    Discharge instructions   Complete by:  As directed    Would recommend family being with you the first 3-5 days after discharge as you adjust to being home Bowel regimen while taking so many opioids as these can be constipating BMP 1 week   Increase activity slowly   Complete by:  As directed      Allergies as of 01/22/2018      Reactions   Aspartame And Phenylalanine    Aspirin Nausea And Vomiting   Maxzide [hydrochlorothiazide W-triamterene] Swelling   Fluid retention   Metformin And Related Diarrhea   Nsaids Nausea And Vomiting   Other Other (See Comments)   All steroids produce psychosis per pt Artificial sweeteners produce nausea and upset stomach.   Pravachol [pravastatin] Other (See Comments)   unknown   Stadol [butorphanol] Other (See Comments)   Toradol, etc And related- hallucinations   Toradol [ketorolac Tromethamine] Other (See Comments)   hallucinations   Vistaril [hydroxyzine Hcl] Other (See Comments)   unknown   Erythromycin Itching, Rash   Morphine And Related Hives, Rash   Iv morphine.   Penicillins Hives, Itching, Rash   Has patient had a PCN reaction causing immediate rash, facial/tongue/throat swelling, SOB or lightheadedness with hypotension: yes Has patient had a PCN reaction causing severe rash involving mucus membranes or skin necrosis: unknown Has patient had a PCN reaction that required hospitalization : unknown Has patient had a PCN reaction occurring within the last 10 years: no If all of the above answers are "NO", then may proceed with Cephalosporin use.      Medication List    STOP taking these medications   furosemide 40 MG tablet Commonly known as:  LASIX   spironolactone 25 MG tablet Commonly known as:  ALDACTONE   ticagrelor 90 MG Tabs tablet Commonly  known as:  BRILINTA     TAKE these medications   ALPRAZolam 0.5 MG tablet Commonly known as:  XANAX Take 0.5 mg by mouth 4 (four) times daily as needed for anxiety.   atorvastatin 80 MG tablet Commonly known as:  LIPITOR Take 1 tablet (80 mg total) by mouth at bedtime.   carvedilol 6.25 MG tablet Commonly known as:  COREG Take 1 tablet (6.25 mg total) by mouth 2 (two) times daily with a meal.   divalproex 250 MG DR tablet Commonly known as:  DEPAKOTE  Take 1,000 mg by mouth at bedtime.   ELDERBERRY PO Take 1 Dose by mouth daily.   LANTUS SOLOSTAR 100 UNIT/ML Solostar Pen Generic drug:  Insulin Glargine Inject 20-50 Units into the skin daily. Take based on blood sugar   levothyroxine 200 MCG tablet Commonly known as:  SYNTHROID, LEVOTHROID Take 1 tablet (200 mcg total) by mouth at bedtime. What changed:  when to take this   MORPHABOND ER 30 MG T12a Generic drug:  Morphine Sulfate ER Take 1 tablet by mouth every 12 (twelve) hours.   oxyCODONE-acetaminophen 10-325 MG tablet Commonly known as:  PERCOCET Take 1 tablet by mouth 3 (three) times daily as needed for pain.   prasugrel 10 MG Tabs tablet Commonly known as:  EFFIENT Take 1 tablet (10 mg total) by mouth daily. Start taking on:  January 23, 2018   pregabalin 75 MG capsule Commonly known as:  LYRICA Take 1 capsule (75 mg total) 2 (two) times daily by mouth.   promethazine 25 MG tablet Commonly known as:  PHENERGAN Take 25 mg by mouth every 6 (six) hours as needed for nausea.   sacubitril-valsartan 24-26 MG Commonly known as:  ENTRESTO Take 1 tablet by mouth 2 (two) times daily.   senna-docusate 8.6-50 MG tablet Commonly known as:  Senokot-S Take 2 tablets by mouth 2 (two) times daily.      Follow-up Information    Lendon Colonel, NP Follow up on 02/03/2018.   Specialties:  Nurse Practitioner, Radiology, Cardiology Why:  Please arrive 15 minutes early for your 9:30am post-hospital cardiology  follow-up appointment Contact information: [address] [place] [address] [phone]        Medicine, [facility] Family Follow up in 1 week(s).   Specialty:  Family Medicine Contact information: [place] [address] [phone]            Time coordinating discharge: 25 min  Signed:  Geradine Girt DO  [facility] 01/22/2018, 11:12 AM

## 2018-01-22 NOTE — Progress Notes (Signed)
Progress Note  Patient Name: Andrea [name] Date of Encounter: 01/22/2018  Primary Cardiologist: Kirk Ruths, MD   Subjective   No complaints this morning. Denies chest pain, SOB, or palpitations. Asking when she can go home.   Inpatient Medications    Scheduled Meds: . atorvastatin  80 mg Oral QHS  . carvedilol  6.25 mg Oral BID WC  . divalproex  1,000 mg Oral QHS  . enoxaparin (LOVENOX) injection  40 mg Subcutaneous QHS  . insulin aspart  0-15 Units Subcutaneous TID WC  . insulin aspart  0-5 Units Subcutaneous QHS  . levothyroxine  200 mcg Oral Q0600  . morphine  15 mg Oral Q12H  . polyethylene glycol  17 g Oral Daily  . prasugrel  10 mg Oral Daily  . senna-docusate  2 tablet Oral BID   Continuous Infusions:  PRN Meds: acetaminophen, ALPRAZolam, diphenhydrAMINE-zinc acetate, ondansetron (ZOFRAN) IV, oxyCODONE-acetaminophen **AND** [DISCONTINUED] oxyCODONE   Vital Signs    Vitals:   01/21/18 1335 01/21/18 1945 01/22/18 0223 01/22/18 0228  BP: (!) 170/76 (!) 180/64  (!) 126/48  Pulse: 90 85  86  Resp: 20 18  18   Temp: 98.4 F (36.9 C) 98.3 F (36.8 C)  98.6 F (37 C)  TempSrc: Oral Oral  Oral  SpO2: 100% 99%  92%  Weight:   69 kg   Height:        Intake/Output Summary (Last 24 hours) at 01/22/2018 0847 Last data filed at 01/21/2018 2000 Gross per 24 hour  Intake 180 ml  Output 900 ml  Net -720 ml   Filed Weights   01/20/18 0347 01/21/18 0511 01/22/18 0223  Weight: 68.5 kg 70 kg 69 kg    Telemetry    NSR - Personally Reviewed  Physical Exam   GEN: Laying in bed in no acute distress.   Neck: No JVD, no carotid bruits Cardiac: RRR, no murmurs, rubs, or gallops.  Respiratory: Clear to auscultation bilaterally, no wheezes/ rales/ rhonchi GI: NABS, Soft, nontender, non-distended  MS: No edema; No deformity. Neuro:  Nonfocal, moving all extremities spontaneously Psych: Normal affect   Labs    Chemistry Recent Labs  Lab 01/20/18 0739  01/21/18 0411 01/22/18 0415  NA 141 138 140  K 4.5 4.4 4.1  CL 102 99 103  CO2 27 31 25   GLUCOSE 129* 159* 110*  BUN 37* 37* 26*  CREATININE 2.27* 1.68* 1.25*  CALCIUM 8.7* 8.5* 8.8*  GFRNONAA 22* 31* 45*  GFRAA 25* 36* 52*  ANIONGAP 12 8 12      Hematology Recent Labs  Lab 01/20/18 0739 01/21/18 0411 01/22/18 0415  WBC 9.4 8.4 8.3  RBC 3.61* 3.38* 3.62*  HGB 10.4* 9.6* 10.4*  HCT 32.7* 30.1* 31.6*  MCV 90.6 89.1 87.3  MCH 28.8 28.4 28.7  MCHC 31.8 31.9 32.9  RDW 12.2 12.2 12.0  PLT 292 238 219    Cardiac Enzymes Recent Labs  Lab 01/17/18 2243 01/18/18 0116 01/18/18 0417  TROPONINI <0.03 <0.03 <0.03    Recent Labs  Lab 01/17/18 1716  TROPIPOC 0.01     BNP Recent Labs  Lab 01/17/18 1715  BNP 267.6*     DDimer No results for input(s): DDIMER in the last 168 hours.   Radiology    No results found.  Cardiac Studies   Right/Left heart catheterization 12/29/17:  RPDA lesion is 75% stenosed.  Post Atrio lesion is 50% stenosed.  Prox LAD lesion is 90% stenosed.  Dist LAD lesion is 90%  stenosed.  Ost 1st Diag to 1st Diag lesion is 90% stenosed.  Ost 1st Mrg to 1st Mrg lesion is 80% stenosed.  Prox LAD to Mid LAD lesion is 40% stenosed.  There is moderate to severe left ventricular systolic dysfunction.  LV end diastolic pressure is normal.  There is no mitral valve regurgitation.  There is no aortic valve stenosis.   1. Severe 3 vessel obstructive CAD. 2. Moderate to severe LV dysfunction. EF 30-35% 3. Normal LV filling pressures 4. Normal right heart pressures. 5. Normal cardiac output.   Plan: patient is diabetic with LV dysfunction and 3 vessel CAD. Recommend surgical consultation for revascularization. If she is not felt to be a surgical candidate we could treat the proximal LAD +/- distal LAD with PCI. The other vessels are not suitable PCI targets.   Coronary stent intervention 12/30/17:  Prox LAD lesion is 90% stenosed.  A  drug-eluting stent was successfully placed using a STENT SYNERGY DES 3X38.  Post intervention, there is 0% residual stenosis.  Dist LAD lesion is 90% stenosed.  A drug-eluting stent was successfully placed using a STENT SYNERGY DES 2.5X16.  Post intervention, there is a 0% residual stenosis.   1. Successful PCI of the proximal and distal LAD with DES x 2.  Plan: since patient is ASA allergic I would recommend Brilinta monotherapy 90 mg bid for one year then 60 mg bid long term. Patient is a candidate for discharge from a cardiac standpoint tomorrow. Her residual CAD will be treated medically.   Echo 12/26/17 Study Conclusions - Left ventricle: Apical images sub optimal due to breast implants. The cavity size was mildly dilated. Wall thickness was increased in a pattern of mild LVH. Systolic function was moderately to severely reduced. The estimated ejection fraction was in the range of 30% to 35%. Diffuse hypokinesis. Doppler parameters are consistent with both elevated ventricular end-diastolic filling pressure and elevated left atrial filling pressure. - Left atrium: The atrium was moderately dilated.  Patient Profile     68 y.o.femalewithrecently diagnosed left ventricular systolic dysfunction and multivessel CAD, status post 2 drug-eluting stents to the LAD artery on 12/30/2017, with residual stenoses in the diagonal artery and oblique marginal branch, not amenable to [place] returned withshortness of breath at rest, despite the absence of edema and substantial weightgain  Assessment & Plan    1. CAD s/p recent PCI to LADx2: patient presented with SOB felt to be 2/2 brilinta. Transitioned to Effient with improvement in SOB. Also with chest heaviness which is worsened by anxiety. Trops negative. EKG non-ischemic. Not on ASA due to allergy - Continue effient and lipitor - Continue carvedilol - No further ischemic evaluation at this time.   2. Ischemic  cardiomyopathy/ chronic systolic CHF: No evidence of volume overload on exam. Entresto and spironolactone on hold given AKI. Cr improved to 1.25, almost back to baseline of 1.0. SOB improved with transition from brilinta to effient.  - Resume Entresto today - Would continue to hold spironolactone and lasix and consider resuming outpatient if Cr stable - Will need repeat echo in 3 months to monitor for improvement in EF - if EF remains <35% will need to consider ICD for primary prevention.   3. Acute on chronic renal insufficiency stage 3: Cr elevated on admission, likely 2/2 over diuresis. Home entresto, lasix, and spironolactone held on admission. Cr improved to 1.25 today; near baseline of 1.0.  - Continue to monitor Cr closely - Continue to hold spironolactone and lasix  at discharge  4. HTN: BP stable this morning but overall elevated this admission in the setting of holding home entresto, lasix, and spironolactone due to AKI - Continue carvedilol for now - Resume home entresto today   [facility] will sign off.   Medication Recommendations:  Continue effient, atorvastatin, carvedilol, and Entresto per [facility]; continue to hold spironolactone and lasix at discharge - can consider restarting outpatient if Cr stable Other recommendations (labs, testing, etc):  Will need BMET at next follow-up visit Follow up as an outpatient:  TOC visit scheduled with Jory Sims, NP 02/03/18 at 9:30am.      For questions or updates, please contact [place] Please consult [email or web address] for contact info under Cardiology/STEMI.      Signed, Abigail Butts, PA-C  01/22/2018, 8:47 AM   [phone]

## 2018-01-22 NOTE — Progress Notes (Signed)
Patient ambulated in the hallway with standby assistance.  Oxygen saturation 95-97% on room air.

## 2018-01-22 NOTE — Care Management Obs Status (Signed)
[place] NOTIFICATION   Patient Details  Name: Andrea [name] MRN: [id number] Date of Birth: 07-Jan-1951   Medicare Observation Status Notification Given:  Yes    Royston Bake, RN 01/22/2018, 10:13 AM

## 2018-01-23 NOTE — Telephone Encounter (Signed)
TOC- 1st attempt to contact pt- lmtcb

## 2018-01-26 NOTE — Telephone Encounter (Signed)
TOC- 2nd Attempt to contact pt-lmtcb

## 2018-01-27 NOTE — Telephone Encounter (Signed)
TOC- 3rd attempt to contact pt.lmtcb.

## 2018-01-27 NOTE — Progress Notes (Signed)
Cardiology Office Note   Date:  01/28/2018   ID:  Andrea [name], DOB [birth year], MRN [id number]  PCP:  Medicine, [facility]  Cardiologist:  Dr. Pennie Banter Chief Complaint  Patient presents with  . Coronary Artery Disease  . Hypertension     History of Present Illness: Andrea [name] is a 68 y.o. female who presents for ongoing assessment and management of multivessel CAD, s/p DES to the LAD on 12/30/2017 with residual stenosis in the diagonal artery and oblique marginal branch not amendable to PCI. He has other history of LV dysfunction wiith EF of 30%-35%.   Discharged from [facility] on 01/22/2018 after above procedures and readmission for worsening dyspnea. This was thought to be related to Brilinta and deconditioning Brilinta was changed to Effient 10 mg daily. She is on Entresto but was taken off of lasix and aldactone due to elevated creatinine.   She is here today with complaints of papular rash on her right hand and arm, her back and feet. It is not painful, there is mild itching but not significantly so. She also complains of daily vomiting since leaving the hospital and along with dizziness. She has not passed out.   Past Medical History:  Diagnosis Date  . Breast cancer ([place])    bilat mastectomy and LUE lymphadenectomy 2014, chemoRx 2014 - 15  . Diabetes mellitus without complication ([place])   . Esophageal reflux   . Hypertension   . Hypothyroid   . Migraine   . Seizure [facility])     Past Surgical History:  Procedure Laterality Date  . ABDOMINAL HYSTERECTOMY     1984 , done for ruptured cyst and endometriosis  . APPENDECTOMY    . CHOLECYSTECTOMY     1980's  . CORONARY STENT INTERVENTION N/A 12/30/2017   Procedure: CORONARY STENT INTERVENTION;  Surgeon: Martinique, Peter M, MD;  Location: [place] CV LAB;  Service: Cardiovascular;  Laterality: N/A;  . EYE SURGERY  08/28/2015   Right eye  . KNEE SURGERY Left   . MASTECTOMY     bilateral  . Open surgery  for bowel obstruction, 2000's    . RIGHT/LEFT HEART CATH AND CORONARY ANGIOGRAPHY N/A 12/29/2017   Procedure: RIGHT/LEFT HEART CATH AND CORONARY ANGIOGRAPHY;  Surgeon: Martinique, Peter M, MD;  Location: [place] CV LAB;  Service: Cardiovascular;  Laterality: N/A;     Current Outpatient Medications  Medication Sig Dispense Refill  . atorvastatin (LIPITOR) 80 MG tablet Take 1 tablet (80 mg total) by mouth at bedtime. 30 tablet 3  . carvedilol (COREG) 6.25 MG tablet Take 1 tablet (6.25 mg total) by mouth 2 (two) times daily with a meal. 60 tablet 3  . divalproex (DEPAKOTE) 250 MG DR tablet Take 1,000 mg by mouth at bedtime.     [name] ELDERBERRY PO Take 1 Dose by mouth daily.    . furosemide (LASIX) 20 MG tablet Take 1 tablet (20 mg total) by mouth daily. 30 tablet 3  . LANTUS SOLOSTAR 100 UNIT/ML Solostar Pen Inject 20-50 Units into the skin daily. Take based on blood sugar  1  . levothyroxine (SYNTHROID, LEVOTHROID) 200 MCG tablet Take 1 tablet (200 mcg total) by mouth at bedtime. (Patient taking differently: Take 200 mcg by mouth daily before breakfast. ) 30 tablet 0  . MORPHABOND ER 30 MG T12A Take 1 tablet by mouth every 12 (twelve) hours.  0  . oxyCODONE-acetaminophen (PERCOCET) 10-325 MG tablet Take 1 tablet by mouth 3 (three) times daily as needed for  pain.  0  . pregabalin (LYRICA) 75 MG capsule Take 1 capsule (75 mg total) 2 (two) times daily by mouth. 60 capsule 0  . promethazine (PHENERGAN) 25 MG tablet Take 25 mg by mouth every 6 (six) hours as needed for nausea.   1  . sacubitril-valsartan (ENTRESTO) 24-26 MG Take 1 tablet by mouth 2 (two) times daily. 60 tablet 3  . senna-docusate (SENOKOT-S) 8.6-50 MG tablet Take 2 tablets by mouth 2 (two) times daily.    . clopidogrel (PLAVIX) 75 MG tablet Take 1 tablet (75 mg total) by mouth daily. 30 tablet 3   No current facility-administered medications for this visit.     Allergies:   Aspartame and phenylalanine; Aspirin; Maxzide  [hydrochlorothiazide w-triamterene]; Metformin and related; Nsaids; Other; Pravachol [pravastatin]; Stadol [butorphanol]; Toradol [ketorolac tromethamine]; Vistaril [hydroxyzine hcl]; Erythromycin; Morphine and related; and Penicillins    Social History:  The patient  reports that she has never smoked. She has never used smokeless tobacco. She reports that she does not drink alcohol or use drugs.   Family History:  The patient's family history is not on file.    ROS: All other systems are reviewed and negative. Unless otherwise mentioned in H&P    PHYSICAL EXAM: VS:  BP 140/80   Pulse 84   Ht 5\' 5"  (1.651 m)   Wt 150 lb 12.8 oz (68.4 kg)   BMI 25.09 kg/m  , BMI Body mass index is 25.09 kg/m. GEN: Well nourished, well developed, in no acute distress HEENT: normal Neck: no JVD, carotid bruits, or masses Cardiac: RRR;   no murmurs, rubs, or gallops,no edema  Respiratory:  Clear to auscultation bilaterally, normal work of breathing GI: soft, nontender, nondistended, + BS MS: no deformity or atrophy Skin: warm and dry, no rash. Red papular rash on the right hand, wrist, and arm, lesions on the upper chest and lower back. No weeping or erythema.  Neuro:  Strength and sensation are intact Psych: euthymic mood, full affect   EKG: NSR rate of  84 bpm.   Recent Labs: 06/02/2017: ALT 27 01/05/2018: TSH 11.368 01/17/2018: B Natriuretic Peptide 267.6 01/19/2018: Magnesium 1.5 01/22/2018: BUN 26; Creatinine, Ser 1.25; Hemoglobin 10.4; Platelets 219; Potassium 4.1; Sodium 140    Lipid Panel No results found for: CHOL, TRIG, HDL, CHOLHDL, VLDL, LDLCALC, LDLDIRECT    Wt Readings from Last 3 Encounters:  01/28/18 150 lb 12.8 oz (68.4 kg)  01/22/18 152 lb 1.6 oz (69 kg)  01/08/18 156 lb 9.6 oz (71 kg)      Other studies Reviewed: Echocardiogram 2018-01-03 Left ventricle: Apical images sub optimal due to breast implants.   The cavity size was mildly dilated. Wall thickness was  increased   in a pattern of mild LVH. Systolic function was moderately to   severely reduced. The estimated ejection fraction was in the   range of 30% to 35%. Diffuse hypokinesis. Doppler parameters are  consistent with both elevated ventricular end-diastolic filling   pressure and elevated left atrial filling pressure. - Left atrium: The atrium was moderately dilated.  Cardiac Cath 12/30/2017  Prox LAD lesion is 90% stenosed.  A drug-eluting stent was successfully placed using a STENT SYNERGY DES 3X38.  Post intervention, there is 0% residual stenosis.  Dist LAD lesion is 90% stenosed.  A drug-eluting stent was successfully placed using a STENT SYNERGY DES 2.5X16.  Post intervention, there is a 0% residual stenosis.   1. Successful PCI of the proximal and distal LAD with  DES x 2.  Plan: since patient is ASA allergic I would recommend Brilinta monotherapy 90 mg bid for one year then 60 mg bid long term. Patient is a candidate for discharge from a cardiac standpoint tomorrow. Her residual CAD will be treated medically.    ASSESSMENT AND PLAN:  1. CAD: Hx of DES to the proximal LAD and distal LAD. She is intolerant of Brilinta due to dyspnea, and appears to have a rash associated with use of Effient. I will stop the Effient and begin Plavix 75 mg daily. I will check BMET, CBC, Sed rate and CRP. Continue statin therapy.  2. Daily vomiting: She is on phenergan for this. I will refer her to GI for further evaluation.   3. ICM: She is on Carvedilol 6.25 mg BID, Entresto, and lasix. She is weighing daily with stable weights at home.   4. Chronic Pain: On medications which can cause dizziness and drowsiness. This include morphine patch and phenergan. She is followed by PCP for this  5. Diabetes: Concern for delayed gastric emptying causing vomiting. GI to follow this     Current medicines are reviewed at length with the patient today.    Labs/ tests ordered today include:CMET,  CBC, CRP, and Sed Rate.   Phill Myron. West Pugh, ANP, AACC   01/28/2018 12:15 PM    [facility] [place] [address] Office [phone] Fax [phone]

## 2018-01-28 ENCOUNTER — Encounter: Payer: Self-pay | Admitting: Adult Health

## 2018-01-28 ENCOUNTER — Ambulatory Visit: Payer: Medicare Other | Admitting: Adult Health

## 2018-01-28 VITALS — BP 140/80 | HR 84 | Ht 65.0 in | Wt 150.8 lb

## 2018-01-28 DIAGNOSIS — I2510 Atherosclerotic heart disease of native coronary artery without angina pectoris: Secondary | ICD-10-CM | POA: Diagnosis not present

## 2018-01-28 DIAGNOSIS — E7800 Pure hypercholesterolemia, unspecified: Secondary | ICD-10-CM | POA: Diagnosis not present

## 2018-01-28 DIAGNOSIS — Z9861 Coronary angioplasty status: Secondary | ICD-10-CM

## 2018-01-28 DIAGNOSIS — Z79899 Other long term (current) drug therapy: Secondary | ICD-10-CM

## 2018-01-28 DIAGNOSIS — R1110 Vomiting, unspecified: Secondary | ICD-10-CM | POA: Diagnosis not present

## 2018-01-28 DIAGNOSIS — R21 Rash and other nonspecific skin eruption: Secondary | ICD-10-CM

## 2018-01-28 MED ORDER — SACUBITRIL-VALSARTAN 24-26 MG PO TABS: 1.0000 | ORAL_TABLET | Freq: Two times a day (BID) | ORAL | 3 refills | Status: DC

## 2018-01-28 MED ORDER — CARVEDILOL 6.25 MG PO TABS: 6.2500 mg | ORAL_TABLET | Freq: Two times a day (BID) | ORAL | 3 refills | Status: DC

## 2018-01-28 MED ORDER — FUROSEMIDE 20 MG PO TABS: 20.0000 mg | ORAL_TABLET | Freq: Every day | ORAL | 3 refills | Status: DC

## 2018-01-28 MED ORDER — ATORVASTATIN CALCIUM 80 MG PO TABS: 80.0000 mg | ORAL_TABLET | Freq: Every day | ORAL | 3 refills | Status: AC

## 2018-01-28 MED ORDER — CLOPIDOGREL BISULFATE 75 MG PO TABS: 75.0000 mg | ORAL_TABLET | Freq: Every day | ORAL | 3 refills | Status: DC

## 2018-01-28 NOTE — Telephone Encounter (Signed)
TOC no answer this is 4th attempt.Patient has not returned call.

## 2018-01-28 NOTE — Patient Instructions (Signed)
Medication Instructions:  STOP EFFIENT START PLAVIX 75MG  DAILY If you need a refill on your cardiac medications before your next appointment, please call your pharmacy.  Labwork: BMET,CBC,SED RATE AND CRP TODAY HERE IN OUR OFFICE AT [facility]   Take the provided lab slips with you to the lab for your blood draw.  When you have your labs (blood work) drawn today and your tests are completely normal, you will receive your results only by MyChart Message (if you have MyChart) -OR-  A paper copy in the mail.  If you have any lab test that is abnormal or we need to change your treatment, we will call you to review these results.  Special Instructions: REFERRAL TO GI  Follow-Up: You will need a follow up appointment in 1 months.  Please call our office 2 months in advance to schedule this appointment.  You may see Kirk Ruths, MD Jory Sims, DNP, AACC   or one of the following Advanced Practice Providers on your designated Care Team:  Kerin Ransom, PA-C  Roby Lofts, PA-C  Sande Rives, [place]  At [facility], you and your health needs are our priority.  As part of our continuing mission to provide you with exceptional heart care, we have created designated Provider Care Teams.  These Care Teams include your primary Cardiologist (physician) and Advanced Practice Providers (APPs -  Physician Assistants and Nurse Practitioners) who all work together to provide you with the care you need, when you need it.  Thank you for choosing [facility] at [facility]!!

## 2018-01-29 LAB — BASIC METABOLIC PANEL
BUN/Creatinine Ratio: 22 (ref 12–28)
BUN: 30 mg/dL — ABNORMAL HIGH (ref 8–27)
CO2: 26 mmol/L (ref 20–29)
Calcium: 9.3 mg/dL (ref 8.7–10.3)
Chloride: 97 mmol/L (ref 96–106)
Creatinine, Ser: 1.34 mg/dL — ABNORMAL HIGH (ref 0.57–1.00)
GFR calc Af Amer: 48 mL/min/{1.73_m2} — ABNORMAL LOW (ref >59)
GFR calc non Af Amer: 41 mL/min/{1.73_m2} — ABNORMAL LOW (ref >59)
Glucose: 184 mg/dL — ABNORMAL HIGH (ref 65–99)
Potassium: 4.4 mmol/L (ref 3.5–5.2)
SODIUM: 137 mmol/L (ref 134–144)

## 2018-01-29 LAB — CBC
HEMOGLOBIN: 10.8 g/dL — AB (ref 11.1–15.9)
Hematocrit: 32 % — ABNORMAL LOW (ref 34.0–46.6)
MCH: 30.4 pg (ref 26.6–33.0)
MCHC: 33.8 g/dL (ref 31.5–35.7)
MCV: 90 fL (ref 79–97)
Platelets: 277 10*3/uL (ref 150–450)
RBC: 3.55 x10E6/uL — ABNORMAL LOW (ref 3.77–5.28)
RDW: 12.6 % (ref 11.7–15.4)
WBC: 7.9 10*3/uL (ref 3.4–10.8)

## 2018-01-29 LAB — SEDIMENTATION RATE: Sed Rate: 61 mm/hr — ABNORMAL HIGH (ref 0–40)

## 2018-01-29 LAB — C-REACTIVE PROTEIN: CRP: 7 mg/L (ref 0–10)

## 2018-02-03 ENCOUNTER — Ambulatory Visit: Payer: Medicare Other | Admitting: Adult Health

## 2018-02-12 ENCOUNTER — Telehealth: Payer: Self-pay | Admitting: *Deleted

## 2018-02-12 DIAGNOSIS — I5022 Chronic systolic (congestive) heart failure: Secondary | ICD-10-CM

## 2018-02-12 NOTE — Telephone Encounter (Signed)
Left message for patient to call and schedule appointment at [place]

## 2018-02-17 NOTE — Telephone Encounter (Signed)
Left message for patient to call regarding referral to GI

## 2018-02-23 ENCOUNTER — Inpatient Hospital Stay (HOSPITAL_COMMUNITY)
Admission: EM | Admit: 2018-02-23 | Discharge: 2018-03-07 | DRG: 291 | Disposition: A | Discharge status: Home Health | Payer: Medicare Other | Attending: Internal Medicine | Admitting: Internal Medicine

## 2018-02-23 ENCOUNTER — Emergency Department (HOSPITAL_COMMUNITY): Payer: Medicare Other

## 2018-02-23 ENCOUNTER — Other Ambulatory Visit: Payer: Self-pay

## 2018-02-23 ENCOUNTER — Encounter (HOSPITAL_COMMUNITY): Payer: Self-pay | Admitting: Emergency Medicine

## 2018-02-23 DIAGNOSIS — R0902 Hypoxemia: Secondary | ICD-10-CM

## 2018-02-23 DIAGNOSIS — F419 Anxiety disorder, unspecified: Secondary | ICD-10-CM | POA: Diagnosis present

## 2018-02-23 DIAGNOSIS — E039 Hypothyroidism, unspecified: Secondary | ICD-10-CM | POA: Diagnosis present

## 2018-02-23 DIAGNOSIS — IMO0001 Reserved for inherently not codable concepts without codable children: Secondary | ICD-10-CM

## 2018-02-23 DIAGNOSIS — B349 Viral infection, unspecified: Secondary | ICD-10-CM | POA: Diagnosis not present

## 2018-02-23 DIAGNOSIS — J810 Acute pulmonary edema: Secondary | ICD-10-CM | POA: Diagnosis not present

## 2018-02-23 DIAGNOSIS — Z7902 Long term (current) use of antithrombotics/antiplatelets: Secondary | ICD-10-CM

## 2018-02-23 DIAGNOSIS — N179 Acute kidney failure, unspecified: Secondary | ICD-10-CM

## 2018-02-23 DIAGNOSIS — I10 Essential (primary) hypertension: Secondary | ICD-10-CM | POA: Diagnosis present

## 2018-02-23 DIAGNOSIS — D649 Anemia, unspecified: Secondary | ICD-10-CM | POA: Diagnosis present

## 2018-02-23 DIAGNOSIS — R0602 Shortness of breath: Secondary | ICD-10-CM

## 2018-02-23 DIAGNOSIS — I130 Hypertensive heart and chronic kidney disease with heart failure and stage 1 through stage 4 chronic kidney disease, or unspecified chronic kidney disease: Principal | ICD-10-CM | POA: Diagnosis present

## 2018-02-23 DIAGNOSIS — Z794 Long term (current) use of insulin: Secondary | ICD-10-CM

## 2018-02-23 DIAGNOSIS — I160 Hypertensive urgency: Secondary | ICD-10-CM | POA: Diagnosis present

## 2018-02-23 DIAGNOSIS — Z79891 Long term (current) use of opiate analgesic: Secondary | ICD-10-CM

## 2018-02-23 DIAGNOSIS — N183 Chronic kidney disease, stage 3 (moderate): Secondary | ICD-10-CM | POA: Diagnosis present

## 2018-02-23 DIAGNOSIS — Z9013 Acquired absence of bilateral breasts and nipples: Secondary | ICD-10-CM

## 2018-02-23 DIAGNOSIS — Z90710 Acquired absence of both cervix and uterus: Secondary | ICD-10-CM

## 2018-02-23 DIAGNOSIS — J9601 Acute respiratory failure with hypoxia: Secondary | ICD-10-CM | POA: Diagnosis present

## 2018-02-23 DIAGNOSIS — E860 Dehydration: Secondary | ICD-10-CM | POA: Diagnosis not present

## 2018-02-23 DIAGNOSIS — Z881 Allergy status to other antibiotic agents status: Secondary | ICD-10-CM

## 2018-02-23 DIAGNOSIS — I5023 Acute on chronic systolic (congestive) heart failure: Secondary | ICD-10-CM

## 2018-02-23 DIAGNOSIS — Z79890 Hormone replacement therapy (postmenopausal): Secondary | ICD-10-CM

## 2018-02-23 DIAGNOSIS — Z888 Allergy status to other drugs, medicaments and biological substances status: Secondary | ICD-10-CM

## 2018-02-23 DIAGNOSIS — D509 Iron deficiency anemia, unspecified: Secondary | ICD-10-CM | POA: Diagnosis present

## 2018-02-23 DIAGNOSIS — Z79899 Other long term (current) drug therapy: Secondary | ICD-10-CM

## 2018-02-23 DIAGNOSIS — R1110 Vomiting, unspecified: Secondary | ICD-10-CM | POA: Diagnosis not present

## 2018-02-23 DIAGNOSIS — E1122 Type 2 diabetes mellitus with diabetic chronic kidney disease: Secondary | ICD-10-CM | POA: Diagnosis present

## 2018-02-23 DIAGNOSIS — E8809 Other disorders of plasma-protein metabolism, not elsewhere classified: Secondary | ICD-10-CM | POA: Diagnosis present

## 2018-02-23 DIAGNOSIS — G8929 Other chronic pain: Secondary | ICD-10-CM | POA: Diagnosis present

## 2018-02-23 DIAGNOSIS — Z880 Allergy status to penicillin: Secondary | ICD-10-CM

## 2018-02-23 DIAGNOSIS — I2510 Atherosclerotic heart disease of native coronary artery without angina pectoris: Secondary | ICD-10-CM

## 2018-02-23 DIAGNOSIS — I509 Heart failure, unspecified: Secondary | ICD-10-CM

## 2018-02-23 DIAGNOSIS — Z886 Allergy status to analgesic agent status: Secondary | ICD-10-CM

## 2018-02-23 DIAGNOSIS — Z9861 Coronary angioplasty status: Secondary | ICD-10-CM

## 2018-02-23 DIAGNOSIS — I5043 Acute on chronic combined systolic (congestive) and diastolic (congestive) heart failure: Secondary | ICD-10-CM | POA: Diagnosis present

## 2018-02-23 DIAGNOSIS — E119 Type 2 diabetes mellitus without complications: Secondary | ICD-10-CM

## 2018-02-23 DIAGNOSIS — Z885 Allergy status to narcotic agent status: Secondary | ICD-10-CM

## 2018-02-23 DIAGNOSIS — E875 Hyperkalemia: Secondary | ICD-10-CM | POA: Diagnosis present

## 2018-02-23 DIAGNOSIS — D638 Anemia in other chronic diseases classified elsewhere: Secondary | ICD-10-CM | POA: Diagnosis present

## 2018-02-23 DIAGNOSIS — Z955 Presence of coronary angioplasty implant and graft: Secondary | ICD-10-CM

## 2018-02-23 DIAGNOSIS — I255 Ischemic cardiomyopathy: Secondary | ICD-10-CM | POA: Diagnosis present

## 2018-02-23 DIAGNOSIS — G3184 Mild cognitive impairment, so stated: Secondary | ICD-10-CM | POA: Diagnosis present

## 2018-02-23 DIAGNOSIS — R569 Unspecified convulsions: Secondary | ICD-10-CM | POA: Diagnosis present

## 2018-02-23 DIAGNOSIS — Z853 Personal history of malignant neoplasm of breast: Secondary | ICD-10-CM

## 2018-02-23 LAB — CBC WITH DIFFERENTIAL/PLATELET
Abs Immature Granulocytes: 0.03 10*3/uL (ref 0.00–0.07)
Basophils Absolute: 0 10*3/uL (ref 0.0–0.1)
Basophils Relative: 0 %
Eosinophils Absolute: 0.4 10*3/uL (ref 0.0–0.5)
Eosinophils Relative: 6 %
HCT: 26.3 % — ABNORMAL LOW (ref 36.0–46.0)
Hemoglobin: 8.2 g/dL — ABNORMAL LOW (ref 12.0–15.0)
Immature Granulocytes: 0 %
Lymphocytes Relative: 37 %
Lymphs Abs: 2.7 10*3/uL (ref 0.7–4.0)
MCH: 29.2 pg (ref 26.0–34.0)
MCHC: 31.2 g/dL (ref 30.0–36.0)
MCV: 93.6 fL (ref 80.0–100.0)
MONO ABS: 0.7 10*3/uL (ref 0.1–1.0)
Monocytes Relative: 10 %
Neutro Abs: 3.4 10*3/uL (ref 1.7–7.7)
Neutrophils Relative %: 47 %
Platelets: 193 10*3/uL (ref 150–400)
RBC: 2.81 MIL/uL — AB (ref 3.87–5.11)
RDW: 12.4 % (ref 11.5–15.5)
WBC: 7.2 10*3/uL (ref 4.0–10.5)
nRBC: 0 % (ref 0.0–0.2)

## 2018-02-23 LAB — INFLUENZA PANEL BY PCR (TYPE A & B)
Influenza A By PCR: NEGATIVE
Influenza B By PCR: NEGATIVE

## 2018-02-23 LAB — CREATININE, SERUM
Creatinine, Ser: 1.73 mg/dL — ABNORMAL HIGH (ref 0.44–1.00)
GFR calc Af Amer: 35 mL/min — ABNORMAL LOW (ref >60)
GFR calc non Af Amer: 30 mL/min — ABNORMAL LOW (ref >60)

## 2018-02-23 LAB — TROPONIN I
Troponin I: <0.03 ng/mL (ref <0.03)
Troponin I: <0.03 ng/mL (ref <0.03)

## 2018-02-23 LAB — ALBUMIN: Albumin: 2.6 g/dL — ABNORMAL LOW (ref 3.5–5.0)

## 2018-02-23 LAB — BASIC METABOLIC PANEL
Anion gap: 13 (ref 5–15)
BUN: 52 mg/dL — ABNORMAL HIGH (ref 8–23)
CO2: 23 mmol/L (ref 22–32)
Calcium: 8.6 mg/dL — ABNORMAL LOW (ref 8.9–10.3)
Chloride: 105 mmol/L (ref 98–111)
Creatinine, Ser: 1.78 mg/dL — ABNORMAL HIGH (ref 0.44–1.00)
GFR calc Af Amer: 34 mL/min — ABNORMAL LOW (ref >60)
GFR calc non Af Amer: 29 mL/min — ABNORMAL LOW (ref >60)
Glucose, Bld: 159 mg/dL — ABNORMAL HIGH (ref 70–99)
Potassium: 5.5 mmol/L — ABNORMAL HIGH (ref 3.5–5.1)
Sodium: 141 mmol/L (ref 135–145)

## 2018-02-23 LAB — RENAL FUNCTION PANEL
Albumin: 2.4 g/dL — ABNORMAL LOW (ref 3.5–5.0)
Anion gap: 9 (ref 5–15)
BUN: 51 mg/dL — ABNORMAL HIGH (ref 8–23)
CO2: 25 mmol/L (ref 22–32)
Calcium: 8.4 mg/dL — ABNORMAL LOW (ref 8.9–10.3)
Chloride: 105 mmol/L (ref 98–111)
Creatinine, Ser: 1.68 mg/dL — ABNORMAL HIGH (ref 0.44–1.00)
GFR calc Af Amer: 36 mL/min — ABNORMAL LOW (ref >60)
GFR calc non Af Amer: 31 mL/min — ABNORMAL LOW (ref >60)
Glucose, Bld: 181 mg/dL — ABNORMAL HIGH (ref 70–99)
Phosphorus: 4.4 mg/dL (ref 2.5–4.6)
Potassium: 5.3 mmol/L — ABNORMAL HIGH (ref 3.5–5.1)
Sodium: 139 mmol/L (ref 135–145)

## 2018-02-23 LAB — GLUCOSE, CAPILLARY
Glucose-Capillary: 149 mg/dL — ABNORMAL HIGH (ref 70–99)
Glucose-Capillary: 146 mg/dL — ABNORMAL HIGH (ref 70–99)

## 2018-02-23 LAB — CBC
HCT: 29.5 % — ABNORMAL LOW (ref 36.0–46.0)
Hemoglobin: 9.3 g/dL — ABNORMAL LOW (ref 12.0–15.0)
MCH: 28.4 pg (ref 26.0–34.0)
MCHC: 31.5 g/dL (ref 30.0–36.0)
MCV: 90.2 fL (ref 80.0–100.0)
Platelets: 225 10*3/uL (ref 150–400)
RBC: 3.27 MIL/uL — ABNORMAL LOW (ref 3.87–5.11)
RDW: 12.4 % (ref 11.5–15.5)
WBC: 7.7 10*3/uL (ref 4.0–10.5)
nRBC: 0 % (ref 0.0–0.2)

## 2018-02-23 LAB — MAGNESIUM: Magnesium: 1.7 mg/dL (ref 1.7–2.4)

## 2018-02-23 LAB — TSH: TSH: 1.838 u[IU]/mL (ref 0.350–4.500)

## 2018-02-23 LAB — BRAIN NATRIURETIC PEPTIDE: B Natriuretic Peptide: 1296.3 pg/mL — ABNORMAL HIGH (ref 0.0–100.0)

## 2018-02-23 LAB — PHOSPHORUS
Phosphorus: 4.9 mg/dL — ABNORMAL HIGH (ref 2.5–4.6)
Phosphorus: 4.7 mg/dL — ABNORMAL HIGH (ref 2.5–4.6)

## 2018-02-23 MED ORDER — ONDANSETRON HCL 4 MG/2ML IJ SOLN
4.0000 mg | Freq: Once | INTRAMUSCULAR | Status: AC
  Administered 2018-02-23: 4 mg via INTRAVENOUS
  Filled 2018-02-23: qty 2

## 2018-02-23 MED ORDER — VITAMIN D (ERGOCALCIFEROL) 1.25 MG (50000 UNIT) PO CAPS
50000.0000 [IU] | ORAL_CAPSULE | ORAL | Status: DC
  Administered 2018-02-24 – 2018-03-03 (×2): 50000 [IU] via ORAL
  Filled 2018-02-23 (×2): qty 1

## 2018-02-23 MED ORDER — DIVALPROEX SODIUM 250 MG PO DR TAB
1000.0000 mg | DELAYED_RELEASE_TABLET | Freq: Every day | ORAL | Status: DC
  Administered 2018-02-23 – 2018-02-25 (×3): 1000 mg via ORAL
  Administered 2018-02-26: 500 mg via ORAL
  Administered 2018-02-27 – 2018-02-28 (×2): 1000 mg via ORAL
  Filled 2018-02-23 (×3): qty 4
  Filled 2018-02-23: qty 2
  Filled 2018-02-23 (×3): qty 4

## 2018-02-23 MED ORDER — OXYCODONE HCL 5 MG PO TABS
5.0000 mg | ORAL_TABLET | ORAL | Status: DC | PRN
  Administered 2018-02-23 – 2018-03-07 (×15): 5 mg via ORAL
  Filled 2018-02-23 (×16): qty 1

## 2018-02-23 MED ORDER — INSULIN ASPART 100 UNIT/ML ~~LOC~~ SOLN
0.0000 [IU] | Freq: Three times a day (TID) | SUBCUTANEOUS | Status: DC
  Administered 2018-02-23 – 2018-02-25 (×5): 1 [IU] via SUBCUTANEOUS
  Administered 2018-02-26: 2 [IU] via SUBCUTANEOUS
  Administered 2018-02-26: 1 [IU] via SUBCUTANEOUS
  Administered 2018-02-27: 3 [IU] via SUBCUTANEOUS
  Administered 2018-02-28 – 2018-03-07 (×14): 1 [IU] via SUBCUTANEOUS

## 2018-02-23 MED ORDER — CLOPIDOGREL BISULFATE 75 MG PO TABS
75.0000 mg | ORAL_TABLET | Freq: Every day | ORAL | Status: DC
  Administered 2018-02-23 – 2018-03-07 (×13): 75 mg via ORAL
  Filled 2018-02-23 (×13): qty 1

## 2018-02-23 MED ORDER — PREGABALIN 75 MG PO CAPS
75.0000 mg | ORAL_CAPSULE | Freq: Two times a day (BID) | ORAL | Status: DC
  Administered 2018-02-23 – 2018-02-25 (×5): 75 mg via ORAL
  Filled 2018-02-23 (×5): qty 1

## 2018-02-23 MED ORDER — SENNOSIDES-DOCUSATE SODIUM 8.6-50 MG PO TABS
2.0000 | ORAL_TABLET | Freq: Every day | ORAL | Status: DC | PRN
  Administered 2018-02-25 – 2018-02-26 (×2): 2 via ORAL
  Filled 2018-02-23 (×2): qty 2

## 2018-02-23 MED ORDER — HEPARIN SODIUM (PORCINE) 5000 UNIT/ML IJ SOLN
5000.0000 [IU] | Freq: Three times a day (TID) | INTRAMUSCULAR | Status: DC
  Administered 2018-02-23 – 2018-03-07 (×35): 5000 [IU] via SUBCUTANEOUS
  Filled 2018-02-23 (×35): qty 1

## 2018-02-23 MED ORDER — ATORVASTATIN CALCIUM 80 MG PO TABS
80.0000 mg | ORAL_TABLET | Freq: Every day | ORAL | Status: DC
  Administered 2018-02-23 – 2018-03-06 (×12): 80 mg via ORAL
  Filled 2018-02-23 (×12): qty 1

## 2018-02-23 MED ORDER — LEVOTHYROXINE SODIUM 100 MCG PO TABS
200.0000 ug | ORAL_TABLET | Freq: Every day | ORAL | Status: DC
  Administered 2018-02-24 – 2018-03-07 (×12): 200 ug via ORAL
  Filled 2018-02-23 (×12): qty 2

## 2018-02-23 MED ORDER — NITROGLYCERIN 2 % TD OINT
1.0000 [in_us] | TOPICAL_OINTMENT | Freq: Four times a day (QID) | TRANSDERMAL | Status: DC
  Administered 2018-02-23 – 2018-02-26 (×13): 1 [in_us] via TOPICAL
  Filled 2018-02-23: qty 1
  Filled 2018-02-23: qty 30
  Filled 2018-02-23 (×6): qty 1

## 2018-02-23 MED ORDER — MORPHINE SULFATE ER 15 MG PO TBCR
30.0000 mg | EXTENDED_RELEASE_TABLET | Freq: Two times a day (BID) | ORAL | Status: DC
  Administered 2018-02-23 – 2018-02-25 (×6): 30 mg via ORAL
  Administered 2018-02-26: 15 mg via ORAL
  Administered 2018-02-26 – 2018-02-28 (×5): 30 mg via ORAL
  Filled 2018-02-23 (×13): qty 2

## 2018-02-23 MED ORDER — OXYCODONE-ACETAMINOPHEN 10-325 MG PO TABS: 1.0000 | ORAL_TABLET | Freq: Three times a day (TID) | ORAL | Status: DC | PRN

## 2018-02-23 MED ORDER — FUROSEMIDE 10 MG/ML IJ SOLN
40.0000 mg | Freq: Once | INTRAMUSCULAR | Status: AC
  Administered 2018-02-23: 40 mg via INTRAVENOUS
  Filled 2018-02-23: qty 4

## 2018-02-23 MED ORDER — FUROSEMIDE 10 MG/ML IJ SOLN
60.0000 mg | Freq: Two times a day (BID) | INTRAMUSCULAR | Status: DC
  Administered 2018-02-23 – 2018-02-24 (×3): 60 mg via INTRAVENOUS
  Filled 2018-02-23 (×3): qty 6

## 2018-02-23 MED ORDER — INSULIN ASPART 100 UNIT/ML ~~LOC~~ SOLN: 0.0000 [IU] | Freq: Every day | SUBCUTANEOUS | Status: DC

## 2018-02-23 MED ORDER — OXYCODONE-ACETAMINOPHEN 5-325 MG PO TABS
1.0000 | ORAL_TABLET | ORAL | Status: DC | PRN
  Administered 2018-02-23 – 2018-03-07 (×11): 1 via ORAL
  Filled 2018-02-23 (×13): qty 1

## 2018-02-23 MED ORDER — CARVEDILOL 6.25 MG PO TABS
6.2500 mg | ORAL_TABLET | Freq: Two times a day (BID) | ORAL | Status: DC
  Administered 2018-02-23 – 2018-03-06 (×21): 6.25 mg via ORAL
  Filled 2018-02-23 (×23): qty 1

## 2018-02-23 MED ORDER — HYDRALAZINE HCL 20 MG/ML IJ SOLN
10.0000 mg | INTRAMUSCULAR | Status: DC | PRN
  Administered 2018-02-23: 10 mg via INTRAVENOUS
  Filled 2018-02-23: qty 1

## 2018-02-23 MED ORDER — LEVOTHYROXINE SODIUM 100 MCG PO TABS: 200.0000 ug | ORAL_TABLET | Freq: Every day | ORAL | Status: DC

## 2018-02-23 NOTE — H&P (Signed)
History and Physical  Andrea [name] OZY:[id number] DOB: 1950-09-18 DOA: 02/23/2018  Referring physician: ER provider PCP: Medicine, [facility]  Outpatient Specialists: [facility] [place], [place]. Patient coming from: Home  Chief Complaint: Shortness of breath.  HPI:  Patient is a 68 year old female with history of PCI x 2 towards the end of December 2019.  Other prior documented medical history includes hypothyroidism, hypertension, diabetes mellitus, esophageal reflux, migraine, seizure and breast cancer.  Patient's problem started about 3 days prior to admission with what appeared to be a viral-like illness.  Apparently, patient works as a Oceanographer (teaches grade 1 pupil).  Patient developed low-grade fever, nausea and vomiting while at school 3 days ago.  Patient reported temperature of about 100 F.  Patient developed shortness of breath the following day, with orthopnea and dyspnea on exertion, without obvious edema.  Patient also reported chest pain.  According to the patient, earlier today, she felt as if there was an elephant sitting on her chest.  No associated diaphoresis, no radiation of the chest pain.  On presentation to the hospital, the patient's blood pressure was significantly elevated, with systolic blood pressure greater than 185 mmHg.  Cardiac BNP was elevated at 1296.  Potassium is 5.5, with a BUN of 52 and serum creatinine of 1.78.  Serum creatinine was 1.34 about 3 weeks ago.  First troponin is less than 0.03.  Hemoglobin is 8.2.  Chest X-ray revealed findings that were concerning for pulmonary edema.  EKG revealed poor R wave progression.  ED Course: On presentation to the hospital, patient was diuresed.  Blood pressure has gradually been optimized.  Hospitalist team was asked to admit patient for further assessment and management.   Pertinent labs: Sodium is 141, potassium of 5.5, chloride 105, CO2 of 23, BUN of 52 and  serum creatinine of 1.78 with blood sugar of 159.  Albumin is 2.6, phosphorus of 4.9, cardiac BNP of 1296.  CBC reveals WBC of 7.3, hemoglobin of 8.3, hematocrit of 26.3, MCV of 93.6 with platelet count of 193.  Troponin is 0.03.  Chest x-ray revealed findings concerning for pulmonary edema.. EKG: Independently reviewed.  Imaging: independently reviewed.   Review of Systems:  Negative for fever, visual changes, sore throat, rash, new muscle aches, dysuria, bleeding, n/v/abdominal pain.  Past Medical History:  Diagnosis Date  . Breast cancer ([place])    bilat mastectomy and LUE lymphadenectomy 2014, chemoRx 2014 - 15  . Diabetes mellitus without complication ([place])   . Esophageal reflux   . Hypertension   . Hypothyroid   . Migraine   . Seizure [facility])     Past Surgical History:  Procedure Laterality Date  . ABDOMINAL HYSTERECTOMY     1984 , done for ruptured cyst and endometriosis  . APPENDECTOMY    . CHOLECYSTECTOMY     1980's  . CORONARY STENT INTERVENTION N/A 12/30/2017   Procedure: CORONARY STENT INTERVENTION;  Surgeon: Martinique, Peter M, MD;  Location: [place] CV LAB;  Service: Cardiovascular;  Laterality: N/A;  . EYE SURGERY  08/28/2015   Right eye  . KNEE SURGERY Left   . MASTECTOMY     bilateral  . Open surgery for bowel obstruction, 2000's    . RIGHT/LEFT HEART CATH AND CORONARY ANGIOGRAPHY N/A 12/29/2017   Procedure: RIGHT/LEFT HEART CATH AND CORONARY ANGIOGRAPHY;  Surgeon: Martinique, Peter M, MD;  Location: [place] [facility];  Service: Cardiovascular;  Laterality: N/A;     reports that she has  never smoked. She has never used smokeless tobacco. She reports that she does not drink alcohol or use drugs.  Allergies  Allergen Reactions  . Ticagrelor Rash  . Aspartame And Phenylalanine Hives and Diarrhea  . Aspirin Nausea And Vomiting  . Maxzide [Hydrochlorothiazide W-Triamterene] Swelling    Fluid retention  . Metformin And Related Diarrhea  . Nsaids Nausea And Vomiting    . Other Other (See Comments)    All steroids produce psychosis per pt Artificial sweeteners produce nausea and upset stomach.  Gregary Cromer [Pravastatin] Other (See Comments)    unknown  . Stadol [Butorphanol] Other (See Comments)    Toradol, etc And related- hallucinations  . Toradol [Ketorolac Tromethamine] Other (See Comments)    hallucinations  . Vistaril [Hydroxyzine Hcl] Other (See Comments)    unknown  . Erythromycin Itching and Rash  . Morphine And Related Hives and Rash    Iv morphine.  . Penicillins Hives, Itching and Rash    Has patient had a PCN reaction causing immediate rash, facial/tongue/throat swelling, SOB or lightheadedness with hypotension: yes Has patient had a PCN reaction causing severe rash involving mucus membranes or skin necrosis: unknown Has patient had a PCN reaction that required hospitalization : unknown Has patient had a PCN reaction occurring within the last 10 years: no If all of the above answers are "NO", then may proceed with Cephalosporin use.     Family History  Problem Relation Age of Onset  . Sudden Cardiac Death Neg Hx      Prior to Admission medications   Medication Sig Start Date End Date Taking? Authorizing Provider  atorvastatin (LIPITOR) 80 MG tablet Take 1 tablet (80 mg total) by mouth at bedtime. 01/28/18  Yes Lendon Colonel, NP  carvedilol (COREG) 6.25 MG tablet Take 1 tablet (6.25 mg total) by mouth 2 (two) times daily with a meal. 01/28/18  Yes Lendon Colonel, NP  clopidogrel (PLAVIX) 75 MG tablet Take 1 tablet (75 mg total) by mouth daily. 01/28/18  Yes Lendon Colonel, NP  divalproex (DEPAKOTE) 250 MG DR tablet Take 1,000 mg by mouth at bedtime.    Yes [provider]  ELDERBERRY PO Take 1 Dose by mouth daily.   Yes [provider]  furosemide (LASIX) 20 MG tablet Take 1 tablet (20 mg total) by mouth daily. 01/28/18  Yes Lendon Colonel, NP  LANTUS SOLOSTAR 100 UNIT/ML Solostar Pen Inject 20-50  Units into the skin daily as needed (sugar levels). Take based on blood sugar 03/20/17  Yes [provider]  levothyroxine (SYNTHROID, LEVOTHROID) 200 MCG tablet Take 1 tablet (200 mcg total) by mouth at bedtime. Patient taking differently: Take 200 mcg by mouth daily before breakfast.  01/12/17  Yes Lavina Hamman, MD  morphine (KADIAN) 30 MG 24 hr capsule Take 30 mg by mouth 2 (two) times daily.   Yes [provider]  oxyCODONE-acetaminophen (PERCOCET) 10-325 MG tablet Take 1 tablet by mouth 3 (three) times daily as needed for pain. 02/03/17  Yes [provider]  pregabalin (LYRICA) 75 MG capsule Take 1 capsule (75 mg total) 2 (two) times daily by mouth. 11/24/16  Yes Osei-Bonsu, Iona Beard, MD  promethazine (PHENERGAN) 25 MG tablet Take 25 mg by mouth every 6 (six) hours as needed for nausea.  05/30/17  Yes [provider]  sacubitril-valsartan (ENTRESTO) 24-26 MG Take 1 tablet by mouth 2 (two) times daily. 01/28/18  Yes Lendon Colonel, NP  senna-docusate (SENOKOT-S) 8.6-50 MG tablet  Take 2 tablets by mouth 2 (two) times daily. Patient taking differently: Take 2 tablets by mouth daily as needed for mild constipation.  01/22/18  Yes Geradine Girt, DO  Vitamin D, Ergocalciferol, (DRISDOL) 1.25 MG (50000 UT) CAPS capsule Take 50,000 Units by mouth every 7 (seven) days.   Yes [provider]    Physical Exam: Vitals:   02/23/18 0514 02/23/18 0515 02/23/18 0600 02/23/18 0700  BP: (!) 185/80  (!) 119/96 (!) 163/59  Pulse: 89  87 90  Resp: 19  16 15   Temp: 98.4 F (36.9 C)     TempSrc: Oral     SpO2: 100%  100% 100%  Weight:  71.7 kg    Height:  5\' 4"  (1.626 m)      Constitutional:  . Appears calm and comfortable.  Eyes:  [name] Patient is pale.  No jaundice.  ENMT:  . external ears, nose appear normal Neck:  . Neck is supple. No JVD Respiratory:  . CTA bilaterally, no w/r/r.  . Respiratory effort normal. No retractions or accessory muscle  use Cardiovascular:  . S1S2 . No LE extremity edema   Abdomen:  . Abdomen is soft and non tender. Organs are difficult to assess. Neurologic:  . Awake and alert. . Moves all limbs.  Wt Readings from Last 3 Encounters:  02/23/18 71.7 kg  01/28/18 68.4 kg  01/22/18 69 kg    I have personally reviewed following labs and imaging studies  Labs on Admission:  CBC: Recent Labs  Lab 02/23/18 0513  WBC 7.2  NEUTROABS 3.4  HGB 8.2*  HCT 26.3*  MCV 93.6  PLT 833   Basic Metabolic Panel: Recent Labs  Lab 02/23/18 0513  NA 141  K 5.5*  CL 105  CO2 23  GLUCOSE 159*  BUN 52*  CREATININE 1.78*  CALCIUM 8.6*   Liver Function Tests: No results for input(s): AST, ALT, ALKPHOS, BILITOT, PROT, ALBUMIN in the last 168 hours. No results for input(s): LIPASE, AMYLASE in the last 168 hours. No results for input(s): AMMONIA in the last 168 hours. Coagulation Profile: No results for input(s): INR, PROTIME in the last 168 hours. Cardiac Enzymes: Recent Labs  Lab 02/23/18 0513  TROPONINI <0.03   BNP (last 3 results) No results for input(s): PROBNP in the last 8760 hours. HbA1C: No results for input(s): HGBA1C in the last 72 hours. CBG: No results for input(s): GLUCAP in the last 168 hours. Lipid Profile: No results for input(s): CHOL, HDL, LDLCALC, TRIG, CHOLHDL, LDLDIRECT in the last 72 hours. Thyroid Function Tests: No results for input(s): TSH, T4TOTAL, FREET4, T3FREE, THYROIDAB in the last 72 hours. Anemia Panel: No results for input(s): VITAMINB12, FOLATE, FERRITIN, TIBC, IRON, RETICCTPCT in the last 72 hours. Urine analysis:    Component Value Date/Time   COLORURINE YELLOW 01/20/2018 0725   APPEARANCEUR HAZY (A) 01/20/2018 0725   LABSPEC 1.021 01/20/2018 0725   PHURINE 5.0 01/20/2018 0725   GLUCOSEU NEGATIVE 01/20/2018 0725   HGBUR NEGATIVE 01/20/2018 0725   BILIRUBINUR NEGATIVE 01/20/2018 0725   KETONESUR 5 (A) 01/20/2018 0725   PROTEINUR 100 (A) 01/20/2018  0725   UROBILINOGEN 1.0 12/02/2013 1612   NITRITE NEGATIVE 01/20/2018 0725   LEUKOCYTESUR MODERATE (A) 01/20/2018 0725   Sepsis Labs: @LABRCNTIP (procalcitonin:4,lacticidven:4) )No results found for this or any previous visit (from the past 240 hour(s)).    Radiological Exams on Admission: Dg Chest 2 View  Result Date: 02/23/2018 CLINICAL DATA:  Worsening shortness of breath, worse when  lying down. History of CHF. EXAM: CHEST - 2 VIEW COMPARISON:  Chest radiograph January 20, 2018 FINDINGS: Cardiac silhouette is normal in size. Mediastinal silhouette is not suspicious increased interstitial prominence with small pleural effusion most apparent posteriorly. No pneumothorax. Soft tissue planes and included osseous structures are non suspicious. Bilateral breast implants Surgical clips in the included right abdomen compatible with cholecystectomy. Mild approximate T10 compression fracture. IMPRESSION: 1. Increased interstitial prominence with small pleural effusion concerning for pulmonary edema. Electronically Signed   By: Elon Alas M.D.   On: 02/23/2018 05:45    EKG: Independently reviewed.   Active Problems:   * No active hospital problems. *   Assessment/Plan Acute on chronic systolic congestive heart failure/pulmonary edema: Aggressive diuresis. Optimize blood pressure control, but slowly. Continue Coreg. Consider adding ACE inhibitor or ARB or Entresto when renal function permits. Echocardiogram done in 2018 revealed EF of 30 to 35%. Strict I's and O's. Low threshold to consult the cardiology team. Further management will depend on hospital course.  Hypertensive urgency: Gradually optimize. Optimize heart failure treatment. Hydralazine as needed. Further management will depend on hospital course.  Viral syndrome: Influenza a and B PCR. Reviewed with above result.  AKI on CKD 3: AKI could be cardiorenal renal. Monitor with diuresis. Further management depend on  hospital course.  Hyperkalemia: This should improve with diuresis. Repeat renal panel. Further management depend on hospital course.  Chest pain: Troponin is 0.03 EKG is normal Continue to trend troponins Continue Nitropaste  Further management depend on hospital course. Have a low threshold to consult cardiology.  DVT prophylaxis: Subcu to heparin Code Status: Full code Family Communication:  Disposition Plan: Home eventually Consults called: Low threshold to consult cardiology Admission status: Observation  Time spent: 65 minutes  Dana Allan, MD  [facility] Pager #: [phone] 7PM-7AM contact night coverage as above  02/23/2018, 8:38 AM

## 2018-02-23 NOTE — ED Notes (Signed)
Meal tray ordered 

## 2018-02-23 NOTE — ED Provider Notes (Signed)
[facility] EMERGENCY DEPARTMENT Provider Note   CSN: [id number] Arrival date & time: 02/23/18  0456     History   Chief Complaint Chief Complaint  Patient presents with  . Shortness of Breath    HPI Andrea [name] is a 68 y.o. female.  Patient presents to the emergency department for evaluation of shortness of breath.  She has been noticing increased shortness of breath for the last couple of days.  Tonight when she laid down she got severely short of breath.  She does have swelling in her legs.  Patient reports that she had stenting in December of this past year and then subsequently was diagnosed with congestive heart failure.  She is currently on Lasix 20 mg daily.  She has been taking her Lasix as prescribed.  Tonight she has chest tightness in association with her shortness of breath.  She has not had any cough, flu symptoms.  Patient brought in by EMS.  They report crackles at the bases of her lungs, significant hypertension.  She has been administered sublingual nitroglycerin for her blood pressure and placed on supplemental oxygen.  Her room air oxygen saturation was 89%.  She does not use oxygen at home.     Past Medical History:  Diagnosis Date  . Breast cancer ([place])    bilat mastectomy and LUE lymphadenectomy 2014, chemoRx 2014 - 15  . Diabetes mellitus without complication ([place])   . Esophageal reflux   . Hypertension   . Hypothyroid   . Migraine   . Seizure [facility])     Patient Active Problem List   Diagnosis Date Noted  . Acute respiratory failure with hypoxia ([place]) 01/20/2018  . Acute encephalopathy 01/20/2018  . Constipation 01/20/2018  . High risk medication use 01/19/2018  . Chest pain, rule out acute myocardial infarction 01/17/2018  . Cardiomyopathy ([place]) 01/08/2018  . Drug allergy, multiple 01/08/2018  . History of breast cancer 01/08/2018  . Chronic systolic heart failure ([place])   . Ischemic chest pain ([place]) 01/04/2018  .  Chest pain 01/04/2018  . CAD S/P percutaneous coronary angioplasty 12/30/2017  . Acute CHF (congestive heart failure) ([place]) 12/26/2017  . Unspecified abdominal pain 06/02/2017  . Hyperglycemia 06/01/2017  . C. difficile colitis 03/17/2017  . AKI (acute kidney injury) ([place])   . Diverticulitis 02/17/2017  . Frequent falls 01/08/2017  . Rhabdomyolysis 01/08/2017  . Chronic pain 01/08/2017  . Intractable nausea and vomiting 11/23/2016  . Diarrhea 11/23/2016  . UTI (urinary tract infection) 12/03/2013  . Hypertension   . Insulin dependent diabetes mellitus ([place])   . Migraine   . Hypothyroid   . History of seizures   . Esophageal reflux   . Multiple rib fractures 11/28/2013    Past Surgical History:  Procedure Laterality Date  . ABDOMINAL HYSTERECTOMY     1984 , done for ruptured cyst and endometriosis  . APPENDECTOMY    . CHOLECYSTECTOMY     1980's  . CORONARY STENT INTERVENTION N/A 12/30/2017   Procedure: CORONARY STENT INTERVENTION;  Surgeon: Martinique, Peter M, MD;  Location: [facility];  Service: Cardiovascular;  Laterality: N/A;  . EYE SURGERY  08/28/2015   Right eye  . KNEE SURGERY Left   . MASTECTOMY     bilateral  . Open surgery for bowel obstruction, 2000's    . RIGHT/LEFT HEART CATH AND CORONARY ANGIOGRAPHY N/A 12/29/2017   Procedure: RIGHT/LEFT HEART CATH AND CORONARY ANGIOGRAPHY;  Surgeon: Martinique, Peter M, MD;  Location: [facility]  INVASIVE CV LAB;  Service: Cardiovascular;  Laterality: N/A;     OB History   No obstetric history on file.      Home Medications    Prior to Admission medications   Medication Sig Start Date End Date Taking? Authorizing Provider  atorvastatin (LIPITOR) 80 MG tablet Take 1 tablet (80 mg total) by mouth at bedtime. 01/28/18   Lendon Colonel, NP  carvedilol (COREG) 6.25 MG tablet Take 1 tablet (6.25 mg total) by mouth 2 (two) times daily with a meal. 01/28/18   Lendon Colonel, NP  clopidogrel (PLAVIX) 75 MG tablet Take 1 tablet  (75 mg total) by mouth daily. 01/28/18   Lendon Colonel, NP  divalproex (DEPAKOTE) 250 MG DR tablet Take 1,000 mg by mouth at bedtime.     [provider]  ELDERBERRY PO Take 1 Dose by mouth daily.    [provider]  furosemide (LASIX) 20 MG tablet Take 1 tablet (20 mg total) by mouth daily. 01/28/18   Lendon Colonel, NP  LANTUS SOLOSTAR 100 UNIT/ML Solostar Pen Inject 20-50 Units into the skin daily. Take based on blood sugar 03/20/17   [provider]  levothyroxine (SYNTHROID, LEVOTHROID) 200 MCG tablet Take 1 tablet (200 mcg total) by mouth at bedtime. Patient taking differently: Take 200 mcg by mouth daily before breakfast.  01/12/17   Lavina Hamman, MD  [facility] ER 30 MG T12A Take 1 tablet by mouth every 12 (twelve) hours. 12/15/17   [provider]  oxyCODONE-acetaminophen (PERCOCET) 10-325 MG tablet Take 1 tablet by mouth 3 (three) times daily as needed for pain. 02/03/17   [provider]  pregabalin (LYRICA) 75 MG capsule Take 1 capsule (75 mg total) 2 (two) times daily by mouth. 11/24/16   Benito Mccreedy, MD  promethazine (PHENERGAN) 25 MG tablet Take 25 mg by mouth every 6 (six) hours as needed for nausea.  05/30/17   [provider]  sacubitril-valsartan (ENTRESTO) 24-26 MG Take 1 tablet by mouth 2 (two) times daily. 01/28/18   Lendon Colonel, NP  senna-docusate (SENOKOT-S) 8.6-50 MG tablet Take 2 tablets by mouth 2 (two) times daily. 01/22/18   Geradine Girt, DO    Family History Family History  Problem Relation Age of Onset  . Sudden Cardiac Death Neg Hx     Social History Social History   Tobacco Use  . Smoking status: Never Smoker  . Smokeless tobacco: Never Used  Substance Use Topics  . Alcohol use: No  . Drug use: No     Allergies   Aspartame and phenylalanine; Aspirin; Maxzide [hydrochlorothiazide w-triamterene]; Metformin and related; Nsaids; Other; Pravachol [pravastatin]; Stadol [butorphanol];  Toradol [ketorolac tromethamine]; Vistaril [hydroxyzine hcl]; Erythromycin; Morphine and related; and Penicillins   Review of Systems Review of Systems  Respiratory: Positive for chest tightness and shortness of breath.   All other systems reviewed and are negative.    Physical Exam Updated Vital Signs BP (!) 185/80 (BP Location: Right Arm)   Pulse 89   Temp 98.4 F (36.9 C) (Oral)   Resp 19   Ht 5\' 4"  (1.626 m)   Wt 71.7 kg   SpO2 100% Comment: Simultaneous filing. User may not have seen previous data.  BMI 27.12 kg/m   Physical Exam Vitals signs and nursing note reviewed.  Constitutional:      General: She is not in acute distress.    Appearance: Normal appearance. She is well-developed.  HENT:  Head: Normocephalic and atraumatic.     Right Ear: Hearing normal.     Left Ear: Hearing normal.     Nose: Nose normal.  Eyes:     Conjunctiva/sclera: Conjunctivae normal.     Pupils: Pupils are equal, round, and reactive to light.  Neck:     Musculoskeletal: Normal range of motion and neck supple.  Cardiovascular:     Rate and Rhythm: Regular rhythm.     Heart sounds: S1 normal and S2 normal. No murmur. No friction rub. No gallop.   Pulmonary:     Effort: Accessory muscle usage present.     Breath sounds: Examination of the right-lower field reveals rales. Examination of the left-lower field reveals rales. Rales present.  Chest:     Chest wall: No tenderness.  Abdominal:     General: Bowel sounds are normal.     Palpations: Abdomen is soft.     Tenderness: There is no abdominal tenderness. There is no guarding or rebound. Negative signs include Murphy's sign and McBurney's sign.     Hernia: No hernia is present.  Musculoskeletal: Normal range of motion.     Right lower leg: 1+ Pitting Edema present.     Left lower leg: 1+ Pitting Edema present.  Skin:    General: Skin is warm and dry.     Findings: No rash.  Neurological:     Mental Status: She is alert and  oriented to person, place, and time.     GCS: GCS eye subscore is 4. GCS verbal subscore is 5. GCS motor subscore is 6.     Cranial Nerves: No cranial nerve deficit.     Sensory: No sensory deficit.     Coordination: Coordination normal.  Psychiatric:        Speech: Speech normal.        Behavior: Behavior normal.        Thought Content: Thought content normal.      ED Treatments / Results  Labs (all labs ordered are listed, but only abnormal results are displayed) Labs Reviewed  CBC WITH DIFFERENTIAL/PLATELET - Abnormal; Notable for the following components:      Result Value   RBC 2.81 (*)    Hemoglobin 8.2 (*)    HCT 26.3 (*)    All other components within normal limits  BASIC METABOLIC PANEL  TROPONIN I  BRAIN NATRIURETIC PEPTIDE    EKG EKG Interpretation  Date/Time:  Monday February 23 2018 05:12:36 EST Ventricular Rate:  89 PR Interval:    QRS Duration: 81 QT Interval:  376 QTC Calculation: 458 R Axis:   67 Text Interpretation:  Sinus rhythm Normal ECG Confirmed by Orpah Greek [phone]) on 02/23/2018 5:45:50 AM   Radiology Dg Chest 2 View  Result Date: 02/23/2018 CLINICAL DATA:  Worsening shortness of breath, worse when lying down. History of CHF. EXAM: CHEST - 2 VIEW COMPARISON:  Chest radiograph January 20, 2018 FINDINGS: Cardiac silhouette is normal in size. Mediastinal silhouette is not suspicious increased interstitial prominence with small pleural effusion most apparent posteriorly. No pneumothorax. Soft tissue planes and included osseous structures are non suspicious. Bilateral breast implants Surgical clips in the included right abdomen compatible with cholecystectomy. Mild approximate T10 compression fracture. IMPRESSION: 1. Increased interstitial prominence with small pleural effusion concerning for pulmonary edema. Electronically Signed   By: Elon Alas M.D.   On: 02/23/2018 05:45    Procedures Procedures (including critical care  time)  Medications Ordered in ED Medications  nitroGLYCERIN (NITROGLYN) 2 % ointment 1 inch (has no administration in time range)  furosemide (LASIX) injection 40 mg (has no administration in time range)     Initial Impression / Assessment and Plan / ED Course  I have reviewed the triage vital signs and the nursing notes.  Pertinent labs & imaging results that were available during my care of the patient were reviewed by me and considered in my medical decision making (see chart for details).    Patient presents to the emergency department for evaluation of shortness of breath.  Patient recently diagnosed with congestive heart failure, currently on Lasix 20 mg daily.  She has not missed any doses.  Over the last couple of days she has noticed increasing shortness of breath, orthopnea and dyspnea on exertion.  Breathing worsened tonight and she has developed tightness in her chest.  Work-up consistent with acute decompensation.  Chest x-ray shows developing pulmonary edema.  Patient administered IV Lasix here in the ER, Nitropaste.  She was exhibiting accelerated hypertension during transport for EMS, treated with sublingual nitroglycerin and has improved.  CRITICAL CARE Performed by: Orpah Greek   Total critical care time: 35 minutes  Critical care time was exclusive of separately billable procedures and treating other patients.  Critical care was necessary to treat or prevent imminent or life-threatening deterioration.  Critical care was time spent personally by me on the following activities: development of treatment plan with patient and/or surrogate as well as nursing, discussions with consultants, evaluation of patient's response to treatment, examination of patient, obtaining history from patient or surrogate, ordering and performing treatments and interventions, ordering and review of laboratory studies, ordering and review of radiographic studies, pulse oximetry and  re-evaluation of patient's condition.    Final Clinical Impressions(s) / ED Diagnoses   Final diagnoses:  Hypertensive urgency  Acute on chronic systolic congestive heart failure [facility])    ED Discharge Orders    None       Orpah Greek, MD 02/23/18 [phone]

## 2018-02-23 NOTE — ED Notes (Signed)
Pt requesting pain medication, pt c/o low back pain. Pt reports she takes oxy 10/325 and Morphine time release at home regularly.

## 2018-02-23 NOTE — ED Triage Notes (Signed)
Pt transported from home by EMS c/o increasingly worsening shob, more so at night when she attempts to lay down. RBP 208/100 after Nitro x 2 SL, RA sat 89%, up to 96% on 4L 02.  Unable to est IV, pt was unable to tolerate Bipap.

## 2018-02-23 NOTE — ED Notes (Signed)
Place purwick on pt.

## 2018-02-23 NOTE — ED Notes (Signed)
Spoke with admitting MD regarding patient's hypertension; new orders placed for 10 mg hydralazine q4 prn SBP > 180.

## 2018-02-24 DIAGNOSIS — R569 Unspecified convulsions: Secondary | ICD-10-CM | POA: Diagnosis present

## 2018-02-24 DIAGNOSIS — G8929 Other chronic pain: Secondary | ICD-10-CM

## 2018-02-24 DIAGNOSIS — D509 Iron deficiency anemia, unspecified: Secondary | ICD-10-CM | POA: Diagnosis present

## 2018-02-24 DIAGNOSIS — E039 Hypothyroidism, unspecified: Secondary | ICD-10-CM | POA: Diagnosis present

## 2018-02-24 DIAGNOSIS — E8809 Other disorders of plasma-protein metabolism, not elsewhere classified: Secondary | ICD-10-CM | POA: Diagnosis present

## 2018-02-24 DIAGNOSIS — Z794 Long term (current) use of insulin: Secondary | ICD-10-CM | POA: Diagnosis not present

## 2018-02-24 DIAGNOSIS — E860 Dehydration: Secondary | ICD-10-CM | POA: Diagnosis not present

## 2018-02-24 DIAGNOSIS — N183 Chronic kidney disease, stage 3 (moderate): Secondary | ICD-10-CM | POA: Diagnosis present

## 2018-02-24 DIAGNOSIS — D638 Anemia in other chronic diseases classified elsewhere: Secondary | ICD-10-CM | POA: Diagnosis present

## 2018-02-24 DIAGNOSIS — I10 Essential (primary) hypertension: Secondary | ICD-10-CM | POA: Diagnosis not present

## 2018-02-24 DIAGNOSIS — I5043 Acute on chronic combined systolic (congestive) and diastolic (congestive) heart failure: Secondary | ICD-10-CM | POA: Diagnosis present

## 2018-02-24 DIAGNOSIS — Z9861 Coronary angioplasty status: Secondary | ICD-10-CM | POA: Diagnosis not present

## 2018-02-24 DIAGNOSIS — Z79899 Other long term (current) drug therapy: Secondary | ICD-10-CM | POA: Diagnosis not present

## 2018-02-24 DIAGNOSIS — I5022 Chronic systolic (congestive) heart failure: Secondary | ICD-10-CM | POA: Diagnosis not present

## 2018-02-24 DIAGNOSIS — N179 Acute kidney failure, unspecified: Secondary | ICD-10-CM | POA: Diagnosis present

## 2018-02-24 DIAGNOSIS — I2510 Atherosclerotic heart disease of native coronary artery without angina pectoris: Secondary | ICD-10-CM | POA: Diagnosis not present

## 2018-02-24 DIAGNOSIS — I5023 Acute on chronic systolic (congestive) heart failure: Secondary | ICD-10-CM | POA: Diagnosis present

## 2018-02-24 DIAGNOSIS — Z7902 Long term (current) use of antithrombotics/antiplatelets: Secondary | ICD-10-CM | POA: Diagnosis not present

## 2018-02-24 DIAGNOSIS — E119 Type 2 diabetes mellitus without complications: Secondary | ICD-10-CM | POA: Diagnosis not present

## 2018-02-24 DIAGNOSIS — Z853 Personal history of malignant neoplasm of breast: Secondary | ICD-10-CM | POA: Diagnosis not present

## 2018-02-24 DIAGNOSIS — G3184 Mild cognitive impairment, so stated: Secondary | ICD-10-CM | POA: Diagnosis present

## 2018-02-24 DIAGNOSIS — E875 Hyperkalemia: Secondary | ICD-10-CM | POA: Diagnosis present

## 2018-02-24 DIAGNOSIS — I255 Ischemic cardiomyopathy: Secondary | ICD-10-CM | POA: Diagnosis present

## 2018-02-24 DIAGNOSIS — I160 Hypertensive urgency: Secondary | ICD-10-CM | POA: Diagnosis not present

## 2018-02-24 DIAGNOSIS — I130 Hypertensive heart and chronic kidney disease with heart failure and stage 1 through stage 4 chronic kidney disease, or unspecified chronic kidney disease: Secondary | ICD-10-CM | POA: Diagnosis present

## 2018-02-24 DIAGNOSIS — J9601 Acute respiratory failure with hypoxia: Secondary | ICD-10-CM | POA: Diagnosis not present

## 2018-02-24 DIAGNOSIS — I5021 Acute systolic (congestive) heart failure: Secondary | ICD-10-CM | POA: Diagnosis not present

## 2018-02-24 DIAGNOSIS — E1122 Type 2 diabetes mellitus with diabetic chronic kidney disease: Secondary | ICD-10-CM | POA: Diagnosis present

## 2018-02-24 DIAGNOSIS — F419 Anxiety disorder, unspecified: Secondary | ICD-10-CM | POA: Diagnosis present

## 2018-02-24 DIAGNOSIS — R1110 Vomiting, unspecified: Secondary | ICD-10-CM | POA: Diagnosis not present

## 2018-02-24 LAB — CBC
HCT: 25.5 % — ABNORMAL LOW (ref 36.0–46.0)
Hemoglobin: 8 g/dL — ABNORMAL LOW (ref 12.0–15.0)
MCH: 28.6 pg (ref 26.0–34.0)
MCHC: 31.4 g/dL (ref 30.0–36.0)
MCV: 91.1 fL (ref 80.0–100.0)
Platelets: 210 10*3/uL (ref 150–400)
RBC: 2.8 MIL/uL — ABNORMAL LOW (ref 3.87–5.11)
RDW: 12.5 % (ref 11.5–15.5)
WBC: 6.8 10*3/uL (ref 4.0–10.5)
nRBC: 0 % (ref 0.0–0.2)

## 2018-02-24 LAB — GLUCOSE, CAPILLARY
Glucose-Capillary: 100 mg/dL — ABNORMAL HIGH (ref 70–99)
Glucose-Capillary: 131 mg/dL — ABNORMAL HIGH (ref 70–99)
Glucose-Capillary: 144 mg/dL — ABNORMAL HIGH (ref 70–99)
Glucose-Capillary: 123 mg/dL — ABNORMAL HIGH (ref 70–99)

## 2018-02-24 LAB — BASIC METABOLIC PANEL
Anion gap: 9 (ref 5–15)
BUN: 51 mg/dL — ABNORMAL HIGH (ref 8–23)
CO2: 26 mmol/L (ref 22–32)
Calcium: 8.5 mg/dL — ABNORMAL LOW (ref 8.9–10.3)
Chloride: 104 mmol/L (ref 98–111)
Creatinine, Ser: 1.99 mg/dL — ABNORMAL HIGH (ref 0.44–1.00)
GFR calc Af Amer: 30 mL/min — ABNORMAL LOW (ref >60)
GFR calc non Af Amer: 26 mL/min — ABNORMAL LOW (ref >60)
Glucose, Bld: 136 mg/dL — ABNORMAL HIGH (ref 70–99)
Potassium: 5.4 mmol/L — ABNORMAL HIGH (ref 3.5–5.1)
Sodium: 139 mmol/L (ref 135–145)

## 2018-02-24 LAB — TROPONIN I: Troponin I: <0.03 ng/mL (ref <0.03)

## 2018-02-24 MED ORDER — FUROSEMIDE 10 MG/ML IJ SOLN
80.0000 mg | Freq: Two times a day (BID) | INTRAMUSCULAR | Status: DC
  Administered 2018-02-25: 80 mg via INTRAVENOUS
  Filled 2018-02-24: qty 8

## 2018-02-24 MED ORDER — POLYETHYLENE GLYCOL 3350 17 G PO PACK
17.0000 g | PACK | Freq: Every day | ORAL | Status: DC
  Administered 2018-02-24 – 2018-03-07 (×11): 17 g via ORAL
  Filled 2018-02-24 (×12): qty 1

## 2018-02-24 NOTE — Progress Notes (Signed)
Progress Note    Andrea [name]  FWY:[id number] DOB: October 23, 1950  DOA: 02/23/2018 PCP: Medicine, [facility]    Brief Narrative:     Medical records reviewed and are as summarized below:  Andrea [name] is an 68 y.o. female here with volume overload and shortness of breath.  Here December 2019 x 2: stent placement and then re-admission for SOB thought due to brilinta and this medication was changed.  Presented again in Jan 2020 with chest pain and AKI--- was initially on entresto/lasix/aldactone but was discharged on entresto only.    Assessment/Plan:   Active Problems:   Hypertension   Insulin dependent diabetes mellitus (HCC)   Chronic pain   Acute CHF (congestive heart failure) (HCC)   CAD S/P percutaneous coronary angioplasty   Acute respiratory failure with hypoxia (HCC)  Acute on chronic systolic congestive heart failure/pulmonary edema: -IV Lasix -denies weight changes but weight up about 9 lbs since January 8th -was only on entresto at home and ? Lasix?-- may need an additional agent or increase of current medications -daily labs to monitor kidney function - BNP 1300  Hypertensive urgency: Optimize heart failure treatment.  AKI on CKD 3: AKI could be cardiorenal renal. Monitor with diuresis. -check U/A  DM -SSI -holding insulin -being referred to GI for gastric emptying  Anemia -unknown cause -? Extra volume -Hgb in January was 10-- now down to 8 -trend -denies melena  High risk medication use/polypharmacy: -Home meds includemultiple medications including Xanax, Phenergan, MS Contin, Percocet, oxycodone, and Lyrica -- will need discussions with outpatient prescriber to reduce these medications  Hyperkalemia: -monitor with IV Lasix -not on any supplementation  Chest pain: -suspect due to volume overload  Family Communication/Anticipated D/C date and plan/Code Status   DVT prophylaxis:heparin Code Status: Full  Code.  Family Communication:  Disposition Plan: needs continued IV diuresis; will need home O2 prior to discharge   Medical Consultants:    None.     Subjective:   Would like oxygen for home  Objective:    Vitals:   02/23/18 1832 02/24/18 0005 02/24/18 0548 02/24/18 1200  BP: (!) 146/59 (!) 181/65 (!) 131/59 (!) 117/50  Pulse: 88 84 79 78  Resp: 18 16 16 18   Temp: 98.4 F (36.9 C) 98.3 F (36.8 C) 98.2 F (36.8 C) 98.2 F (36.8 C)  TempSrc: Oral Oral Oral Oral  SpO2: 98% 98% 94% 97%  Weight:      Height:        Intake/Output Summary (Last 24 hours) at 02/24/2018 1428 Last data filed at 02/23/2018 1723 Gross per 24 hour  Intake -  Output 600 ml  Net -600 ml   Filed Weights   02/23/18 0515  Weight: 71.7 kg    Exam: In bed, chronically ill appearing- has O2 on rrr Crackles at bases, diminished through out Min LE edema A+Ox3  Data Reviewed:   I have personally reviewed following labs and imaging studies:  Labs: Labs show the following:   Basic Metabolic Panel: Recent Labs  Lab 02/23/18 0513 02/23/18 1449 02/23/18 2020 02/24/18 0020  NA 141  --  139 139  K 5.5*  --  5.3* 5.4*  CL 105  --  105 104  CO2 23  --  25 26  GLUCOSE 159*  --  181* 136*  BUN 52*  --  51* 51*  CREATININE 1.78* 1.73* 1.68* 1.99*  CALCIUM 8.6*  --  8.4* 8.5*  MG  --  1.7  --   --  PHOS 4.9* 4.7* 4.4  --    GFR Estimated Creatinine Clearance: 27 mL/min (A) (by C-G formula based on SCr of 1.99 mg/dL (H)). Liver Function Tests: Recent Labs  Lab 02/23/18 0513 02/23/18 2020  ALBUMIN 2.6* 2.4*   No results for input(s): LIPASE, AMYLASE in the last 168 hours. No results for input(s): AMMONIA in the last 168 hours. Coagulation profile No results for input(s): INR, PROTIME in the last 168 hours.  CBC: Recent Labs  Lab 02/23/18 0513 02/23/18 1449 02/24/18 0020  WBC 7.2 7.7 6.8  NEUTROABS 3.4  --   --   HGB 8.2* 9.3* 8.0*  HCT 26.3* 29.5* 25.5*  MCV 93.6 90.2  91.1  PLT 193 225 210   Cardiac Enzymes: Recent Labs  Lab 02/23/18 0513 02/23/18 1449 02/23/18 2020 02/24/18 0020  TROPONINI <0.03 <0.03 <0.03 <0.03   BNP (last 3 results) No results for input(s): PROBNP in the last 8760 hours. CBG: Recent Labs  Lab 02/23/18 1615 02/23/18 2137 02/24/18 0641 02/24/18 1152  GLUCAP 149* 146* 100* 131*   D-Dimer: No results for input(s): DDIMER in the last 72 hours. Hgb A1c: No results for input(s): HGBA1C in the last 72 hours. Lipid Profile: No results for input(s): CHOL, HDL, LDLCALC, TRIG, CHOLHDL, LDLDIRECT in the last 72 hours. Thyroid function studies: Recent Labs    02/23/18 1449  TSH 1.838   Anemia work up: No results for input(s): VITAMINB12, FOLATE, FERRITIN, TIBC, IRON, RETICCTPCT in the last 72 hours. Sepsis Labs: Recent Labs  Lab 02/23/18 0513 02/23/18 1449 02/24/18 0020  WBC 7.2 7.7 6.8    Microbiology No results found for this or any previous visit (from the past 240 hour(s)).  Procedures and diagnostic studies:  Dg Chest 2 View  Result Date: 02/23/2018 CLINICAL DATA:  Worsening shortness of breath, worse when lying down. History of CHF. EXAM: CHEST - 2 VIEW COMPARISON:  Chest radiograph January 20, 2018 FINDINGS: Cardiac silhouette is normal in size. Mediastinal silhouette is not suspicious increased interstitial prominence with small pleural effusion most apparent posteriorly. No pneumothorax. Soft tissue planes and included osseous structures are non suspicious. Bilateral breast implants Surgical clips in the included right abdomen compatible with cholecystectomy. Mild approximate T10 compression fracture. IMPRESSION: 1. Increased interstitial prominence with small pleural effusion concerning for pulmonary edema. Electronically Signed   By: Elon Alas M.D.   On: 02/23/2018 05:45    Medications:   . atorvastatin  80 mg Oral QHS  . carvedilol  6.25 mg Oral BID WC  . clopidogrel  75 mg Oral Daily  .  divalproex  1,000 mg Oral QHS  . [START ON 02/25/2018] furosemide  80 mg Intravenous Q12H  . heparin  5,000 Units Subcutaneous Q8H  . insulin aspart  0-5 Units Subcutaneous QHS  . insulin aspart  0-9 Units Subcutaneous TID WC  . levothyroxine  200 mcg Oral QAC breakfast  . morphine  30 mg Oral BID  . nitroGLYCERIN  1 inch Topical Q6H  . polyethylene glycol  17 g Oral Daily  . pregabalin  75 mg Oral BID  . Vitamin D (Ergocalciferol)  50,000 Units Oral Q7 days   Continuous Infusions:   LOS: 0 days   Geradine Girt  [facility]   How to contact the [facility] Attending or Consulting provider [place] or covering provider during after hours [place], for this patient?  1. Check the care team in [facility] and look for a) attending/consulting [place] provider listed and b)  the [facility] team listed 2. Log into [email or web address] and use [place]'s universal password to access. If you do not have the password, please contact the hospital operator. 3. Locate the [facility] provider you are looking for under [facility] and page to a number that you can be directly reached. 4. If you still have difficulty reaching the provider, please page the [facility] (Director on Call) for the Hospitalists listed on amion for assistance.  02/24/2018, 2:28 PM

## 2018-02-25 ENCOUNTER — Inpatient Hospital Stay (HOSPITAL_COMMUNITY): Payer: Medicare Other

## 2018-02-25 DIAGNOSIS — I5021 Acute systolic (congestive) heart failure: Secondary | ICD-10-CM

## 2018-02-25 LAB — URINALYSIS, ROUTINE W REFLEX MICROSCOPIC
Bilirubin Urine: NEGATIVE
Glucose, UA: NEGATIVE mg/dL
KETONES UR: NEGATIVE mg/dL
Nitrite: NEGATIVE
Protein, ur: >=300 mg/dL — AB
Specific Gravity, Urine: 1.014 (ref 1.005–1.030)
pH: 5 (ref 5.0–8.0)

## 2018-02-25 LAB — RETICULOCYTES
Immature Retic Fract: 5.2 % (ref 2.3–15.9)
RBC.: 2.37 MIL/uL — ABNORMAL LOW (ref 3.87–5.11)
Retic Count, Absolute: 57.1 10*3/uL (ref 19.0–186.0)
Retic Ct Pct: 2.4 % (ref 0.4–3.1)

## 2018-02-25 LAB — BASIC METABOLIC PANEL
Anion gap: 6 (ref 5–15)
BUN: 52 mg/dL — ABNORMAL HIGH (ref 8–23)
CO2: 28 mmol/L (ref 22–32)
Calcium: 8.3 mg/dL — ABNORMAL LOW (ref 8.9–10.3)
Chloride: 104 mmol/L (ref 98–111)
Creatinine, Ser: 2.53 mg/dL — ABNORMAL HIGH (ref 0.44–1.00)
GFR calc Af Amer: 22 mL/min — ABNORMAL LOW (ref >60)
GFR, EST NON AFRICAN AMERICAN: 19 mL/min — AB (ref >60)
Glucose, Bld: 155 mg/dL — ABNORMAL HIGH (ref 70–99)
Potassium: 5.6 mmol/L — ABNORMAL HIGH (ref 3.5–5.1)
Sodium: 138 mmol/L (ref 135–145)

## 2018-02-25 LAB — GLUCOSE, CAPILLARY
Glucose-Capillary: 126 mg/dL — ABNORMAL HIGH (ref 70–99)
Glucose-Capillary: 143 mg/dL — ABNORMAL HIGH (ref 70–99)
Glucose-Capillary: 117 mg/dL — ABNORMAL HIGH (ref 70–99)
Glucose-Capillary: 144 mg/dL — ABNORMAL HIGH (ref 70–99)

## 2018-02-25 LAB — VITAMIN B12: Vitamin B-12: 283 pg/mL (ref 180–914)

## 2018-02-25 LAB — IRON AND TIBC
Iron: 38 ug/dL (ref 28–170)
Saturation Ratios: 15 % (ref 10.4–31.8)
TIBC: 246 ug/dL — ABNORMAL LOW (ref 250–450)
UIBC: 208 ug/dL

## 2018-02-25 LAB — CBC
HCT: 23.8 % — ABNORMAL LOW (ref 36.0–46.0)
Hemoglobin: 7.6 g/dL — ABNORMAL LOW (ref 12.0–15.0)
MCH: 29.6 pg (ref 26.0–34.0)
MCHC: 31.9 g/dL (ref 30.0–36.0)
MCV: 92.6 fL (ref 80.0–100.0)
PLATELETS: 191 10*3/uL (ref 150–400)
RBC: 2.57 MIL/uL — ABNORMAL LOW (ref 3.87–5.11)
RDW: 12.6 % (ref 11.5–15.5)
WBC: 5.4 10*3/uL (ref 4.0–10.5)
nRBC: 0 % (ref 0.0–0.2)

## 2018-02-25 LAB — FOLATE: Folate: 11.2 ng/mL (ref >5.9)

## 2018-02-25 LAB — FERRITIN: Ferritin: 125 ng/mL (ref 11–307)

## 2018-02-25 MED ORDER — PREGABALIN 75 MG PO CAPS
75.0000 mg | ORAL_CAPSULE | Freq: Every day | ORAL | Status: DC
  Administered 2018-02-26 – 2018-03-07 (×10): 75 mg via ORAL
  Filled 2018-02-25 (×10): qty 1

## 2018-02-25 MED ORDER — PATIROMER SORBITEX CALCIUM 8.4 G PO PACK
8.4000 g | PACK | Freq: Every day | ORAL | Status: AC
  Administered 2018-02-25 – 2018-02-26 (×2): 8.4 g via ORAL
  Filled 2018-02-25 (×2): qty 1

## 2018-02-25 NOTE — Progress Notes (Signed)
PROGRESS NOTE    Andrea [name]  OEV:[id number] DOB: 05-May-1950 DOA: 02/23/2018 PCP: Medicine, [facility]  Brief Narrative: 68 year old female with history of chronic pain, insulin-dependent diabetes, anxiety/depression has been admitted 4 times since December with chest pain and shortness of breath.  Originally admitted in early December, found to have severe ischemic cardiomyopathy with EF of 30 to 35%, underwent diagnostic left heart catheterization in December which revealed severe three-vessel disease, initially CABG was considered but PCI felt to be best therapy, underwent stenting to proximal and distal LAD on 12/30/2017, has residual 90% diagonal and 80% OM1 stenosis . -Discharged home on aspirin and Brilinta quickly readmitted with chest pain on 12/15 and discharged the following day after she was ruled out for MI. -After this she was admitted from 12/28 to 1/2 with chest pain and shortness of breath, she was seen by cardiology, her Kary Kos was changed to Effient since it was suspected to be contributing to her dyspnea in addition she also had acute kidney injury and hence Lasix and Aldactone were held, she was restarted on Entresto at discharge with a creatinine of 1.3 on 1/2. -Subsequently readmitted on 2/3 with worsening shortness of breath, chest pain chest x-ray concerning for pulmonary edema   Assessment & Plan:   Acute on chronic systolic CHF -Last echo with EF of 30 to 35% -Improving with diuresis -Volume status improving, urine output not recorded  -Unfortunately creatinine has trended up from 1.9-2.5 today, will hold diuretics, Entresto on hold due to acute kidney injury -Repeat chest x-ray today  History of multivessel CAD -History of proximal and distal LAD stent on 12/10, -Has residual diagonal and OM1 stenosis, not felt to be appropriate for CABG based on CVTS assessment in December -Continue Plavix, Coreg, statin, allergy list indicates that she is  allergic to aspirin  Acute kidney injury -Likely cardiorenal, worsened with recent Entresto -Creatinine had improved last admission from 2 range to 1.3 at discharge by holding her Entresto, then diuretics were discontinued, at discharge 1/2 was restarted on Entresto -Creatinine has trended up significantly to 2.5 today from 1.9 yesterday we will hold Lasix dose, I/O closely -We will likely discontinue Entresto  Worsening anemia -Likely multifactorial, volume overloaded state could be contributing to some degree of hemodilution -In addition being on antiplatelets increases risk of bleeding, patient denies any overt blood loss -Check Hemoccult stool and anemia panel  Chronic pain -On high-dose morphine and gabapentin -Renally dose gabapentin  Diabetes mellitus -CBGs stable without Lantus which she takes at home, continue sliding scale  DVT prophylaxis: Heparin subcutaneous Code Status: Full code Family Communication: No family at bedside Disposition Plan: Home Pending improvement in acute kidney injury and CHF  Consultants:     Procedures:   Antimicrobials:    Subjective: -Feels a little better, breathing is improving  Objective: Vitals:   02/24/18 2046 02/25/18 0021 02/25/18 0518 02/25/18 0810  BP: (!) 139/52 (!) 148/64 (!) 139/59 (!) 119/52  Pulse: 75 80 78 78  Resp:   16 18  Temp: 97.7 F (36.5 C)  (!) 97.5 F (36.4 C) (!) 97.3 F (36.3 C)  TempSrc: Oral  Oral Oral  SpO2: (!) 88% 98% 98% 99%  Weight:      Height:        Intake/Output Summary (Last 24 hours) at 02/25/2018 1032 Last data filed at 02/24/2018 2302 Gross per 24 hour  Intake 120 ml  Output 200 ml  Net -80 ml   [employer]  02/23/18 0515 02/24/18 1722  Weight: 71.7 kg 72.7 kg    Examination:  Gen: Frail middle-aged female, sitting up up in bed, mild cognitive deficits noted, oriented to self place HEENT: PERRLA, Neck supple, no JVD Lungs: Decreased breath sounds at both bases, rest  clear CVS: RRR,No Gallops,Rubs or new Murmurs Abd: soft, Non tender, non distended, BS present Extremities: No edema Skin: no new rashes Psychiatry: Judgement and insight appear normal. Mood & affect appropriate.     Data Reviewed:   CBC: Recent Labs  Lab 02/23/18 0513 02/23/18 1449 02/24/18 0020 02/25/18 0028  WBC 7.2 7.7 6.8 5.4  NEUTROABS 3.4  --   --   --   HGB 8.2* 9.3* 8.0* 7.6*  HCT 26.3* 29.5* 25.5* 23.8*  MCV 93.6 90.2 91.1 92.6  PLT 193 225 210 409   Basic Metabolic Panel: Recent Labs  Lab 02/23/18 0513 02/23/18 1449 02/23/18 2020 02/24/18 0020 02/25/18 0028  NA 141  --  139 139 138  K 5.5*  --  5.3* 5.4* 5.6*  CL 105  --  105 104 104  CO2 23  --  25 26 28   GLUCOSE 159*  --  181* 136* 155*  BUN 52*  --  51* 51* 52*  CREATININE 1.78* 1.73* 1.68* 1.99* 2.53*  CALCIUM 8.6*  --  8.4* 8.5* 8.3*  MG  --  1.7  --   --   --   PHOS 4.9* 4.7* 4.4  --   --    GFR: Estimated Creatinine Clearance: 21.4 mL/min (A) (by C-G formula based on SCr of 2.53 mg/dL (H)). Liver Function Tests: Recent Labs  Lab 02/23/18 0513 02/23/18 2020  ALBUMIN 2.6* 2.4*   No results for input(s): LIPASE, AMYLASE in the last 168 hours. No results for input(s): AMMONIA in the last 168 hours. Coagulation Profile: No results for input(s): INR, PROTIME in the last 168 hours. Cardiac Enzymes: Recent Labs  Lab 02/23/18 0513 02/23/18 1449 02/23/18 2020 02/24/18 0020  TROPONINI <0.03 <0.03 <0.03 <0.03   BNP (last 3 results) No results for input(s): PROBNP in the last 8760 hours. HbA1C: No results for input(s): HGBA1C in the last 72 hours. CBG: Recent Labs  Lab 02/24/18 0641 02/24/18 1152 02/24/18 1626 02/24/18 2223 02/25/18 0808  GLUCAP 100* 131* 144* 123* 126*   Lipid Profile: No results for input(s): CHOL, HDL, LDLCALC, TRIG, CHOLHDL, LDLDIRECT in the last 72 hours. Thyroid Function Tests: Recent Labs    02/23/18 1449  TSH 1.838   Anemia Panel: Recent Labs     02/25/18 0753  VITAMINB12 283  FOLATE 11.2  FERRITIN 125  TIBC 246*  IRON 38  RETICCTPCT 2.4   Urine analysis:    Component Value Date/Time   COLORURINE YELLOW 01/20/2018 0725   APPEARANCEUR HAZY (A) 01/20/2018 0725   LABSPEC 1.021 01/20/2018 0725   PHURINE 5.0 01/20/2018 0725   GLUCOSEU NEGATIVE 01/20/2018 0725   HGBUR NEGATIVE 01/20/2018 0725   BILIRUBINUR NEGATIVE 01/20/2018 0725   KETONESUR 5 (A) 01/20/2018 0725   PROTEINUR 100 (A) 01/20/2018 0725   UROBILINOGEN 1.0 12/02/2013 1612   NITRITE NEGATIVE 01/20/2018 0725   LEUKOCYTESUR MODERATE (A) 01/20/2018 0725   Sepsis Labs: @LABRCNTIP (procalcitonin:4,lacticidven:4)  )No results found for this or any previous visit (from the past 240 hour(s)).       Radiology Studies: No results found.      Scheduled Meds: . atorvastatin  80 mg Oral QHS  . carvedilol  6.25 mg Oral BID WC  .  clopidogrel  75 mg Oral Daily  . divalproex  1,000 mg Oral QHS  . heparin  5,000 Units Subcutaneous Q8H  . insulin aspart  0-5 Units Subcutaneous QHS  . insulin aspart  0-9 Units Subcutaneous TID WC  . levothyroxine  200 mcg Oral QAC breakfast  . morphine  30 mg Oral BID  . nitroGLYCERIN  1 inch Topical Q6H  . patiromer  8.4 g Oral Daily  . polyethylene glycol  17 g Oral Daily  . pregabalin  75 mg Oral BID  . Vitamin D (Ergocalciferol)  50,000 Units Oral Q7 days   Continuous Infusions:   LOS: 1 day    Time spent: 106min    Domenic Polite, MD [facility]  02/25/2018, 10:32 AM

## 2018-02-25 NOTE — Progress Notes (Signed)
SATURATION QUALIFICATIONS: (This note is used to comply with regulatory documentation for home oxygen)  Patient Saturations on Room Air at Rest = 90%  Patient Saturations on Room Air while Ambulating = 86%  Patient Saturations on 2 Liters of oxygen while Ambulating = 94%  Please briefly explain why patient needs home oxygen:Pt needed 2LO2 to keep sats >90% with activity.  Thanks.  [place] Pager:  [phone]  Office:  [phone]

## 2018-02-25 NOTE — Evaluation (Signed)
Physical Therapy Evaluation Patient Details Name: Andrea [name] MRN: [id number] DOB: [birth year] Today's Date: 02/25/2018   History of Present Illness  68 year old female with history of chronic pain, insulin-dependent diabetes, anxiety/depression has been admitted 4 times since December with chest pain and shortness of breath.  Originally admitted in early December, found to have severe ischemic cardiomyopathy with EF of 30 to 35%, underwent diagnostic left heart catheterization in December which revealed severe three-vessel disease, initially CABG was considered but PCI felt to be best therapy, underwent stenting to proximal and distal LAD on 12/30/2017, has residual 90% diagonal and 80% OM1 stenosis .After this she was admitted from 12/28 to 1/2 with chest pain and shortness of breath, she was seen by cardiology, her Kary Kos was changed to Effient since it was suspected to be contributing to her dyspnea in addition she also had acute kidney injury and hence Lasix and Aldactone were held, she was restarted on Entresto at discharge with a creatinine of 1.3 on 1/2.Subsequently readmitted on 2/3 with worsening shortness of breath, chest pain chest x-ray concerning for pulmonary edema.   Clinical Impression  Pt admitted with above diagnosis. Pt currently with functional limitations due to the deficits listed below (see PT Problem List). Pt was able to ambulate with bil UE support with min assist and cues.  Pt unsteady at times.  Desat with activity.  See sats below.  Instructed pt that she will need to use her RW at all times on d/c.  Pt agrees.   Pt will benefit from skilled PT to increase their independence and safety with mobility to allow discharge to the venue listed below.   SATURATION QUALIFICATIONS: (This note is used to comply with regulatory documentation for home oxygen)  Patient Saturations on Room Air at Rest = 90%  Patient Saturations on Room Air while Ambulating = 86%  Patient  Saturations on 2 Liters of oxygen while Ambulating = 94%  Please briefly explain why patient needs home oxygen:Pt needed 2LO2 to keep sats >90% with activity.  Follow Up Recommendations Home health PT;Supervision - Intermittent    Equipment Recommendations  Other (comment)(home O2)    Recommendations for Other Services       Precautions / Restrictions Precautions Precautions: Fall Restrictions Weight Bearing Restrictions: No      Mobility  Bed Mobility Overal bed mobility: Needs Assistance Bed Mobility: Supine to Sit     Supine to sit: Min guard     General bed mobility comments: incr time but no assist needed.    Transfers Overall transfer level: Needs assistance Equipment used: None;2 person hand held assist Transfers: Sit to/from Stand Sit to Stand: Min assist;Min guard         General transfer comment: Pt needed steadying assist withtout UE support. Pt more steady with bil UE support.   Ambulation/Gait Ambulation/Gait assistance: Min guard;Min assist Gait Distance (Feet): 90 Feet Assistive device: 2 person hand held assist;None Gait Pattern/deviations: Step-through pattern;Decreased stride length;Staggering left;Staggering right   Gait velocity interpretation: <1.31 ft/sec, indicative of household ambulator General Gait Details: Pt initiallly not holding onto anything but then she held onto O2 cart as she needs bil UE support for balance.  Pt unsteady at times due to unequal step length at times staggering.  Pt desat to 87% on RA  with activity and needed 2L to keep sats >93%.   Stairs            Wheelchair Mobility    Modified Rankin (Stroke Patients Only)  Balance Overall balance assessment: Needs assistance Sitting-balance support: No upper extremity supported;Feet supported Sitting balance-Leahy Scale: Fair     Standing balance support: Bilateral upper extremity supported;During functional activity Standing balance-Leahy Scale:  Poor Standing balance comment: relies on UE support for balance                             Pertinent Vitals/Pain Pain Assessment: 0-10 Pain Score: 8  Pain Location: back Pain Descriptors / Indicators: Aching;Grimacing;Guarding    Home Living Family/patient expects to be discharged to:: Private residence Living Arrangements: Alone Available Help at Discharge: Family;Available PRN/intermittently Type of Home: Apartment Home Access: Level entry     Home Layout: One level Home Equipment: Bedside commode;Walker - 2 wheels;Shower seat;Grab bars - tub/shower      Prior Function Level of Independence: Independent         Comments: Pt drives and does everything herself     Hand Dominance   Dominant Hand: Right    Extremity/Trunk Assessment   Upper Extremity Assessment Upper Extremity Assessment: Defer to OT evaluation    Lower Extremity Assessment Lower Extremity Assessment: Generalized weakness    Cervical / Trunk Assessment Cervical / Trunk Assessment: Normal  Communication   Communication: No difficulties  Cognition Arousal/Alertness: Awake/alert Behavior During Therapy: WFL for tasks assessed/performed Overall Cognitive Status: Within Functional Limits for tasks assessed                                        General Comments      Exercises General Exercises - Lower Extremity Ankle Circles/Pumps: AROM;Both;10 reps;Seated Long Arc Quad: AROM;Both;10 reps;Seated Hip Flexion/Marching: AROM;Both;10 reps;Seated   Assessment/Plan    PT Assessment Patient needs continued PT services  PT Problem List Decreased activity tolerance;Decreased balance;Decreased mobility;Decreased knowledge of use of DME;Decreased safety awareness;Decreased knowledge of precautions;Cardiopulmonary status limiting activity       PT Treatment Interventions DME instruction;Gait training;Functional mobility training;Therapeutic activities;Therapeutic  exercise;Balance training;Patient/family education    PT Goals (Current goals can be found in the Care Plan section)  Acute Rehab PT Goals Patient Stated Goal: to get better PT Goal Formulation: With patient Time For Goal Achievement: 03/11/18 Potential to Achieve Goals: Good    Frequency Min 3X/week   Barriers to discharge Decreased caregiver support      Co-evaluation               AM-PAC PT "6 Clicks" Mobility  Outcome Measure Help needed turning from your back to your side while in a flat bed without using bedrails?: None Help needed moving from lying on your back to sitting on the side of a flat bed without using bedrails?: None Help needed moving to and from a bed to a chair (including a wheelchair)?: A Little Help needed standing up from a chair using your arms (e.g., wheelchair or bedside chair)?: A Little Help needed to walk in hospital room?: A Little Help needed climbing 3-5 steps with a railing? : A Little 6 Click Score: 20    End of Session Equipment Utilized During Treatment: Gait belt;Oxygen Activity Tolerance: Patient limited by fatigue Patient left: in chair;with call bell/phone within reach;with chair alarm set Nurse Communication: Mobility status PT Visit Diagnosis: Unsteadiness on feet (R26.81);Muscle weakness (generalized) (M62.81)    Time: 9381-8299 PT Time Calculation (min) (ACUTE ONLY): 33 min   Charges:  PT Evaluation $PT Eval Moderate Complexity: 1 Mod PT Treatments $Gait Training: 8-22 mins        [place] Pager:  [phone]  Office:  [place] 02/25/2018, 12:45 PM

## 2018-02-26 LAB — BASIC METABOLIC PANEL
ANION GAP: 12 (ref 5–15)
BUN: 54 mg/dL — ABNORMAL HIGH (ref 8–23)
CO2: 27 mmol/L (ref 22–32)
Calcium: 8.7 mg/dL — ABNORMAL LOW (ref 8.9–10.3)
Chloride: 101 mmol/L (ref 98–111)
Creatinine, Ser: 2.3 mg/dL — ABNORMAL HIGH (ref 0.44–1.00)
GFR calc Af Amer: 25 mL/min — ABNORMAL LOW (ref >60)
GFR calc non Af Amer: 21 mL/min — ABNORMAL LOW (ref >60)
Glucose, Bld: 162 mg/dL — ABNORMAL HIGH (ref 70–99)
POTASSIUM: 5.6 mmol/L — AB (ref 3.5–5.1)
Sodium: 140 mmol/L (ref 135–145)

## 2018-02-26 LAB — CBC
HCT: 23.7 % — ABNORMAL LOW (ref 36.0–46.0)
Hemoglobin: 7.6 g/dL — ABNORMAL LOW (ref 12.0–15.0)
MCH: 29.3 pg (ref 26.0–34.0)
MCHC: 32.1 g/dL (ref 30.0–36.0)
MCV: 91.5 fL (ref 80.0–100.0)
PLATELETS: 181 10*3/uL (ref 150–400)
RBC: 2.59 MIL/uL — ABNORMAL LOW (ref 3.87–5.11)
RDW: 12.4 % (ref 11.5–15.5)
WBC: 5.6 10*3/uL (ref 4.0–10.5)
nRBC: 0 % (ref 0.0–0.2)

## 2018-02-26 LAB — OCCULT BLOOD X 1 CARD TO LAB, STOOL: Fecal Occult Bld: NEGATIVE

## 2018-02-26 LAB — GLUCOSE, CAPILLARY
Glucose-Capillary: 127 mg/dL — ABNORMAL HIGH (ref 70–99)
Glucose-Capillary: 118 mg/dL — ABNORMAL HIGH (ref 70–99)
Glucose-Capillary: 177 mg/dL — ABNORMAL HIGH (ref 70–99)
Glucose-Capillary: 130 mg/dL — ABNORMAL HIGH (ref 70–99)

## 2018-02-26 LAB — HEMOGLOBIN AND HEMATOCRIT, BLOOD
HCT: 27.7 % — ABNORMAL LOW (ref 36.0–46.0)
Hemoglobin: 9 g/dL — ABNORMAL LOW (ref 12.0–15.0)

## 2018-02-26 LAB — ABO/RH: ABO/RH(D): B POS

## 2018-02-26 LAB — PREPARE RBC (CROSSMATCH)

## 2018-02-26 MED ORDER — ONDANSETRON HCL 4 MG/2ML IJ SOLN
4.0000 mg | Freq: Four times a day (QID) | INTRAMUSCULAR | Status: DC | PRN
  Administered 2018-02-26 – 2018-03-06 (×6): 4 mg via INTRAVENOUS
  Filled 2018-02-26 (×6): qty 2

## 2018-02-26 MED ORDER — FUROSEMIDE 10 MG/ML IJ SOLN
40.0000 mg | Freq: Once | INTRAMUSCULAR | Status: AC
  Administered 2018-02-26: 40 mg via INTRAVENOUS
  Filled 2018-02-26: qty 4

## 2018-02-26 MED ORDER — HYDRALAZINE HCL 20 MG/ML IJ SOLN
5.0000 mg | Freq: Once | INTRAMUSCULAR | Status: AC
  Administered 2018-02-26: 5 mg via INTRAVENOUS
  Filled 2018-02-26: qty 1

## 2018-02-26 MED ORDER — LORAZEPAM 0.5 MG PO TABS
0.5000 mg | ORAL_TABLET | Freq: Once | ORAL | Status: AC
  Administered 2018-02-26: 0.5 mg via ORAL
  Filled 2018-02-26: qty 1

## 2018-02-26 MED ORDER — SODIUM CHLORIDE 0.9 % IV SOLN
510.0000 mg | Freq: Once | INTRAVENOUS | Status: AC
  Administered 2018-02-26: 510 mg via INTRAVENOUS
  Filled 2018-02-26: qty 17

## 2018-02-26 MED ORDER — SODIUM CHLORIDE 0.9% IV SOLUTION
Freq: Once | INTRAVENOUS | Status: AC
  Administered 2018-02-26: 16:00:00 via INTRAVENOUS

## 2018-02-26 MED ORDER — ISOSORBIDE MONONITRATE ER 30 MG PO TB24
15.0000 mg | ORAL_TABLET | Freq: Every day | ORAL | Status: DC
  Administered 2018-02-26 – 2018-03-04 (×7): 15 mg via ORAL
  Filled 2018-02-26 (×7): qty 1

## 2018-02-26 MED ORDER — SODIUM CHLORIDE 0.9 % IV SOLN
INTRAVENOUS | Status: DC | PRN
  Administered 2018-02-26: 250 mL via INTRAVENOUS

## 2018-02-26 NOTE — Progress Notes (Signed)
Patient stared feeling SOB and tingling in her face and arms. Vitals were taken and BP was elevated but other vitals were WNL. [facility] was paged. Orders for Hydralazine 5 mg IV was placed. Will continue to monitor.

## 2018-02-26 NOTE — Progress Notes (Signed)
Pt took only Depakote 500 mg and 15 mg of MS Contin. She verbalized that she doesn't want the whole dose tonight.

## 2018-02-26 NOTE — Progress Notes (Signed)
BP has been running high and also patient gets very anxious, MD notified.Andrea [name]

## 2018-02-26 NOTE — Progress Notes (Signed)
Case Management Note  Patient Details  Name: Andrea [name] MRN: [id number] Date of Birth: 09/07/1952  Subjective/Objective:  CHF                 Action/Plan: Patient lives at home alone; PCP is Dr  Myra Gianotti with [facility]; has private insurance with [facility] with prescription drug coverage; pharmacy of choice [facility] [address]; patient states that she does not use any salt in her diet and has scales and knows to weigh herself daily; patient could benefit from a Disease Management program for CHF; patient requested [place]; Dan with [facility] called for arrangements. Patient also stated " I think I need oxygen when I go home." CM will continue to follow for progression of care and possibly of home oxygen.  Expected Discharge Date:    possibly 03/02/2018               Expected Discharge Plan:  Home/Self Care  Discharge planning Services  CM Consult  HH Arranged:  RN, Disease Management, PT [place] Agency:  [place]  Status of Service:  In process, will continue to follow  Sherrilyn Rist [phone] 02/26/2018, 10:11 AM

## 2018-02-26 NOTE — Plan of Care (Signed)
  Problem: Activity: Goal: Capacity to carry out activities will improve Outcome: Progressing   Problem: Education: Goal: Ability to demonstrate management of disease process will improve Outcome: Progressing

## 2018-02-26 NOTE — Progress Notes (Signed)
Physical Therapy Treatment Patient Details Name: Andrea [name] MRN: [id number] DOB: [birth year] Today's Date: 02/26/2018    History of Present Illness Pt is a 68 y.o. female admitted 02/23/18 with chest pain and SOB; CXR concerning for pulmonary edema. Worked up for CHF. Of note, four recent admissions since 12/2017 with similar symptoms, s/p cardiac cath and PCI. PMH includes chronic pain, DM, anxiety/depression.   PT Comments    Pt progressing with mobility. Ambulatory with RW and intermittent min guard for balance; SpO2 >90% on 2L O2 [place]; pt seems to have poor insight into deficits and poor safety awareness. Educ on use of RW for added stability and fall risk reduction. Pt reports she will have necessary assist from sons upon return home. Will continue to follow acutely.    Follow Up Recommendations  Home health PT;Supervision - Intermittent     Equipment Recommendations  None recommended by PT    Recommendations for Other Services       Precautions / Restrictions Precautions Precautions: Fall Restrictions Weight Bearing Restrictions: No    Mobility  Bed Mobility Overal bed mobility: Modified Independent                Transfers Overall transfer level: Modified independent Equipment used: Rolling walker (2 wheeled) Transfers: Sit to/from Stand              Ambulation/Gait Ambulation/Gait assistance: Min Gaffer (Feet): 400 Feet Assistive device: Rolling walker (2 wheeled) Gait Pattern/deviations: Step-through pattern;Decreased stride length;Staggering left;Staggering right Gait velocity: Decreased Gait velocity interpretation: 1.31 - 2.62 ft/sec, indicative of limited community ambulator General Gait Details: Slow, mostly steady gait with RW and supervision for safety. Pt unsteady at times taking uneven steps, holding LLE in air, poor proximity to RW, requiring min guard at these times, but no overt LOB; generally seems due to poor  safety awareness/problem solving.    Stairs             Wheelchair Mobility    Modified Rankin (Stroke Patients Only)       Balance Overall balance assessment: Needs assistance Sitting-balance support: No upper extremity supported;Feet supported Sitting balance-Leahy Scale: Good     Standing balance support: Bilateral upper extremity supported;During functional activity Standing balance-Leahy Scale: Fair Standing balance comment: Can static stand and take steps without UE support                            Cognition Arousal/Alertness: Awake/alert Behavior During Therapy: WFL for tasks assessed/performed Overall Cognitive Status: No family/caregiver present to determine baseline cognitive functioning Area of Impairment: Safety/judgement;Attention;Memory;Problem solving                   Current Attention Level: Selective Memory: Decreased short-term memory   Safety/Judgement: Decreased awareness of safety;Decreased awareness of deficits   Problem Solving: Requires verbal cues General Comments: Interesting, likely baseline cognition      Exercises      General Comments General comments (skin integrity, edema, etc.): SpO2 >90% on 2L O2       Pertinent Vitals/Pain Pain Assessment: Faces Faces Pain Scale: Hurts a little bit Pain Location: Chronic back Pain Descriptors / Indicators: Constant Pain Intervention(s): Limited activity within patient's tolerance    Home Living                      Prior Function            PT Goals (  current goals can now be found in the care plan section) Acute Rehab PT Goals Patient Stated Goal: to get better PT Goal Formulation: With patient Time For Goal Achievement: 03/11/18 Potential to Achieve Goals: Good Progress towards PT goals: Progressing toward goals    Frequency    Min 3X/week      PT Plan Current plan remains appropriate    Co-evaluation              AM-PAC PT  "6 Clicks" Mobility   Outcome Measure  Help needed turning from your back to your side while in a flat bed without using bedrails?: None Help needed moving from lying on your back to sitting on the side of a flat bed without using bedrails?: None Help needed moving to and from a bed to a chair (including a wheelchair)?: A Little Help needed standing up from a chair using your arms (e.g., wheelchair or bedside chair)?: A Little Help needed to walk in hospital room?: A Little Help needed climbing 3-5 steps with a railing? : A Little 6 Click Score: 20    End of Session Equipment Utilized During Treatment: Gait belt;Oxygen Activity Tolerance: Patient tolerated treatment well;Patient limited by fatigue Patient left: in bed;with call bell/phone within reach;with bed alarm set Nurse Communication: Mobility status PT Visit Diagnosis: Unsteadiness on feet (R26.81);Muscle weakness (generalized) (M62.81)     Time: 1410-1430 PT Time Calculation (min) (ACUTE ONLY): 20 min  Charges:  $Gait Training: 8-22 mins                    Mabeline Caras, PT, DPT Acute Rehabilitation Services  Pager [phone] Office [place] 02/26/2018, 3:24 PM

## 2018-02-26 NOTE — Progress Notes (Signed)
PROGRESS NOTE    Andrea [name]  ZWC:[id number] DOB: [birth year] DOA: 02/23/2018 PCP: Medicine, [facility]  Brief Narrative: 68 year old female with history of chronic pain, insulin-dependent diabetes, anxiety/depression has been admitted 4 times since December with chest pain and shortness of breath.  Originally admitted in early December, found to have severe ischemic cardiomyopathy with EF of 30 to 35%, underwent diagnostic left heart catheterization in December which revealed severe three-vessel disease, initially CABG was considered but PCI felt to be best therapy, underwent stenting to proximal and distal LAD on 12/30/2017, has residual 90% diagonal and 80% OM1 stenosis . -Discharged home on aspirin and Brilinta quickly readmitted with chest pain on 12/15 and discharged the following day after she was ruled out for MI. -After this she was admitted from 12/28 to 1/2 with chest pain and shortness of breath, she was seen by cardiology, her Kary Kos was changed to Effient since it was suspected to be contributing to her dyspnea in addition she also had acute kidney injury and hence Lasix and Aldactone were held, she was restarted on Entresto at discharge with a creatinine of 1.3 on 1/2. -Subsequently readmitted on 2/3 with worsening shortness of breath, chest pain chest x-ray concerning for pulmonary edema  Assessment & Plan:   Acute on chronic systolic CHF -Last echo with EF of 30 to 35% -Improving with diuresis -Volume status improving, urine output not recorded for first 48 hours, -1.3 L yesterday -Due to worsening creatinine from 1.9 up to 2.5 yesterday I held her diuretics also on admission Entresto was held due to worsening renal failure  -Will give 1 unit of blood today followed by Lasix 40 mg x 1 -Clinically appears close to euvolemic -Restart oral diuretics tomorrow if stable  History of multivessel CAD -History of proximal and distal LAD stent on 12/10, -Has  residual diagonal and OM1 stenosis, not felt to be appropriate for CABG based on CVTS assessment in December -Continue Plavix, Coreg, statin, allergy list indicates that she is allergic to aspirin -Due to persistent chest pain will add low-dose Imdur  Acute kidney injury -Likely cardiorenal, worsened with recent Entresto -Creatinine had improved last admission from 2 range to 1.3 at discharge by holding her Entresto, then diuretics were discontinued, at discharge on 1/2 was restarted on Entresto -Creatinine has trended up significantly to 2.5 yesterday from 1.9, held IV Lasix yesterday, will give single dose after blood transfusion today -Will discontinue Entresto discharge  Worsening anemia -Likely multifactorial, volume overloaded state could be contributing to some degree of hemodilution -In addition being on antiplatelets increases risk of bleeding, patient denies any overt blood loss -Occult pending, anemia panel with iron deficiency, give 1 unit PRBC and iron IV x1, start oral iron at discharge  Chronic pain -On high-dose morphine and gabapentin -Renally dose gabapentin  Diabetes mellitus -CBGs stable without Lantus which she takes at home, continue sliding scale  DVT prophylaxis: Heparin subcutaneous Code Status: Full code Family Communication: No family at bedside Disposition Plan: Home Pending improvement in acute kidney injury and CHF, likely 48 hours  Consultants:     Procedures:   Antimicrobials:    Subjective: -Had some chest pain last night, breathing is finally improving today  Objective: Vitals:   02/26/18 0554 02/26/18 0753 02/26/18 0925 02/26/18 1113  BP: (!) 151/57 137/64  (!) 156/69  Pulse: 91 85 87 88  Resp: 20 14    Temp: 98.4 F (36.9 C) 98.4 F (36.9 C)  (!) 97.5 F (36.4 C)  TempSrc: Oral Oral  Oral  SpO2: 99% 98% 96% 97%  Weight: 71.1 kg     Height:        Intake/Output Summary (Last 24 hours) at 02/26/2018 1212 Last data filed at  02/26/2018 1049 Gross per 24 hour  Intake 988.07 ml  Output 1000 ml  Net -11.93 ml   Filed Weights   02/23/18 0515 02/24/18 1722 02/26/18 0554  Weight: 71.7 kg 72.7 kg 71.1 kg    Examination:  Gen: Frail chronically ill-appearing female sitting up in bed, no distress HEENT: PERRLA, Neck supple, no JVD Lungs: Good air movement, decreased breath sounds at both bases CVS: RRR,No Gallops,Rubs or new Murmurs Abd: soft, Non tender, non distended, BS present Extremities: Trace edema Skin: no new rashes  Data Reviewed:   CBC: Recent Labs  Lab 02/23/18 0513 02/23/18 1449 02/24/18 0020 02/25/18 0028 02/26/18 0457  WBC 7.2 7.7 6.8 5.4 5.6  NEUTROABS 3.4  --   --   --   --   HGB 8.2* 9.3* 8.0* 7.6* 7.6*  HCT 26.3* 29.5* 25.5* 23.8* 23.7*  MCV 93.6 90.2 91.1 92.6 91.5  PLT 193 225 210 191 086   Basic Metabolic Panel: Recent Labs  Lab 02/23/18 0513 02/23/18 1449 02/23/18 2020 02/24/18 0020 02/25/18 0028 02/26/18 0457  NA 141  --  139 139 138 140  K 5.5*  --  5.3* 5.4* 5.6* 5.6*  CL 105  --  105 104 104 101  CO2 23  --  25 26 28 27   GLUCOSE 159*  --  181* 136* 155* 162*  BUN 52*  --  51* 51* 52* 54*  CREATININE 1.78* 1.73* 1.68* 1.99* 2.53* 2.30*  CALCIUM 8.6*  --  8.4* 8.5* 8.3* 8.7*  MG  --  1.7  --   --   --   --   PHOS 4.9* 4.7* 4.4  --   --   --    GFR: Estimated Creatinine Clearance: 23.3 mL/min (A) (by C-G formula based on SCr of 2.3 mg/dL (H)). Liver Function Tests: Recent Labs  Lab 02/23/18 0513 02/23/18 2020  ALBUMIN 2.6* 2.4*   No results for input(s): LIPASE, AMYLASE in the last 168 hours. No results for input(s): AMMONIA in the last 168 hours. Coagulation Profile: No results for input(s): INR, PROTIME in the last 168 hours. Cardiac Enzymes: Recent Labs  Lab 02/23/18 0513 02/23/18 1449 02/23/18 2020 02/24/18 0020  TROPONINI <0.03 <0.03 <0.03 <0.03   BNP (last 3 results) No results for input(s): PROBNP in the last 8760 hours. HbA1C: No  results for input(s): HGBA1C in the last 72 hours. CBG: Recent Labs  Lab 02/25/18 1122 02/25/18 1815 02/25/18 2142 02/26/18 0727 02/26/18 1111  GLUCAP 143* 117* 144* 127* 118*   Lipid Profile: No results for input(s): CHOL, HDL, LDLCALC, TRIG, CHOLHDL, LDLDIRECT in the last 72 hours. Thyroid Function Tests: Recent Labs    02/23/18 1449  TSH 1.838   Anemia Panel: Recent Labs    02/25/18 0753  VITAMINB12 283  FOLATE 11.2  FERRITIN 125  TIBC 246*  IRON 38  RETICCTPCT 2.4   Urine analysis:    Component Value Date/Time   COLORURINE YELLOW 02/25/2018 2316   APPEARANCEUR HAZY (A) 02/25/2018 2316   LABSPEC 1.014 02/25/2018 2316   PHURINE 5.0 02/25/2018 2316   GLUCOSEU NEGATIVE 02/25/2018 2316   HGBUR SMALL (A) 02/25/2018 2316   BILIRUBINUR NEGATIVE 02/25/2018 2316   KETONESUR NEGATIVE 02/25/2018 2316   PROTEINUR >=300 (A) 02/25/2018 2316  UROBILINOGEN 1.0 12/02/2013 1612   NITRITE NEGATIVE 02/25/2018 2316   LEUKOCYTESUR SMALL (A) 02/25/2018 2316   Sepsis Labs: @LABRCNTIP (procalcitonin:4,lacticidven:4)  )No results found for this or any previous visit (from the past 240 hour(s)).       Radiology Studies: Dg Chest 2 View  Result Date: 02/25/2018 CLINICAL DATA:  Hypoxia. EXAM: CHEST - 2 VIEW COMPARISON:  December 24, 2018 FINDINGS: No pneumothorax. The heart size is borderline. The hila and mediastinum are unremarkable. Small bilateral pleural effusions. No nodules, masses, or suspicious infiltrates. IMPRESSION: Small bilateral pleural effusions. Possible mild pulmonary venous congestion. No overt edema. Electronically Signed   By: Dorise Bullion III M.D   On: 02/25/2018 11:18        Scheduled Meds: . sodium chloride   Intravenous Once  . atorvastatin  80 mg Oral QHS  . carvedilol  6.25 mg Oral BID WC  . clopidogrel  75 mg Oral Daily  . divalproex  1,000 mg Oral QHS  . furosemide  40 mg Intravenous Once  . heparin  5,000 Units Subcutaneous Q8H  . insulin  aspart  0-5 Units Subcutaneous QHS  . insulin aspart  0-9 Units Subcutaneous TID WC  . levothyroxine  200 mcg Oral QAC breakfast  . morphine  30 mg Oral BID  . nitroGLYCERIN  1 inch Topical Q6H  . patiromer  8.4 g Oral Daily  . polyethylene glycol  17 g Oral Daily  . pregabalin  75 mg Oral Daily  . Vitamin D (Ergocalciferol)  50,000 Units Oral Q7 days   Continuous Infusions: . sodium chloride 10 mL/hr at 02/26/18 1049     LOS: 2 days    Time spent: 49min    Domenic Polite, MD [facility]  02/26/2018, 12:12 PM

## 2018-02-27 LAB — GLUCOSE, CAPILLARY
Glucose-Capillary: 99 mg/dL (ref 70–99)
Glucose-Capillary: 120 mg/dL — ABNORMAL HIGH (ref 70–99)
Glucose-Capillary: 201 mg/dL — ABNORMAL HIGH (ref 70–99)
Glucose-Capillary: 127 mg/dL — ABNORMAL HIGH (ref 70–99)

## 2018-02-27 LAB — TYPE AND SCREEN
ABO/RH(D): B POS
Antibody Screen: NEGATIVE
Unit division: 0

## 2018-02-27 LAB — BPAM RBC
Blood Product Expiration Date: 202002272359
ISSUE DATE / TIME: 202002061538
Unit Type and Rh: 7300

## 2018-02-27 LAB — BASIC METABOLIC PANEL
Anion gap: 13 (ref 5–15)
BUN: 54 mg/dL — ABNORMAL HIGH (ref 8–23)
CHLORIDE: 101 mmol/L (ref 98–111)
CO2: 26 mmol/L (ref 22–32)
CREATININE: 2.15 mg/dL — AB (ref 0.44–1.00)
Calcium: 8.8 mg/dL — ABNORMAL LOW (ref 8.9–10.3)
GFR calc non Af Amer: 23 mL/min — ABNORMAL LOW (ref >60)
GFR, EST AFRICAN AMERICAN: 27 mL/min — AB (ref >60)
Glucose, Bld: 117 mg/dL — ABNORMAL HIGH (ref 70–99)
Potassium: 5 mmol/L (ref 3.5–5.1)
Sodium: 140 mmol/L (ref 135–145)

## 2018-02-27 LAB — CBC
HCT: 27.7 % — ABNORMAL LOW (ref 36.0–46.0)
Hemoglobin: 8.9 g/dL — ABNORMAL LOW (ref 12.0–15.0)
MCH: 29.4 pg (ref 26.0–34.0)
MCHC: 32.1 g/dL (ref 30.0–36.0)
MCV: 91.4 fL (ref 80.0–100.0)
Platelets: 174 10*3/uL (ref 150–400)
RBC: 3.03 MIL/uL — ABNORMAL LOW (ref 3.87–5.11)
RDW: 13.2 % (ref 11.5–15.5)
WBC: 4.9 10*3/uL (ref 4.0–10.5)
nRBC: 0 % (ref 0.0–0.2)

## 2018-02-27 NOTE — Progress Notes (Signed)
PROGRESS NOTE    Andrea [name]  NIO:[id number] DOB: Jan 18, 1951 DOA: 02/23/2018 PCP: Medicine, [facility]  Brief Narrative: 68 year old female with history of chronic pain, insulin-dependent diabetes, anxiety/depression has been admitted 4 times since December with chest pain and shortness of breath.  Originally admitted in early December, found to have severe ischemic cardiomyopathy with EF of 30 to 35%, underwent diagnostic left heart catheterization in December which revealed severe three-vessel disease, initially CABG was considered but PCI felt to be best therapy, underwent stenting to proximal and distal LAD on 12/30/2017, has residual 90% diagonal and 80% OM1 stenosis . -Discharged home on aspirin and Brilinta quickly readmitted with chest pain on 12/15 and discharged the following day after she was ruled out for MI. -After this she was admitted from 12/28 to 1/2 with chest pain and shortness of breath, she was seen by cardiology, her Kary Kos was changed to Effient since it was suspected to be contributing to her dyspnea in addition she also had acute kidney injury and hence Lasix and Aldactone were held, she was restarted on Entresto at discharge with a creatinine of 1.3 on 1/2. -Subsequently readmitted on 2/3 with worsening shortness of breath, chest pain chest x-ray concerning for pulmonary edema  Assessment & Plan:   Acute on chronic systolic CHF -Last echo with EF of 30 to 35% -Improving with diuresis -Volume status improving, urine output not recorded for first 48 hours,  -Worsening creatinine from 1.7 range to 2.5 we held her diuretics, Entresto was also held on admission due to worsening renal failure -She was given 40 mg Lasix yesterday with 1 unit of blood -Clinically appears euvolemic, creatinine improving lungs are clear today -Restart oral diuretics tomorrow if stable -Ambulate, out of bed, wean O2 -Also has hypoalbuminemia which also makes volume  management challenging  History of multivessel CAD -History of proximal and distal LAD stent on 12/10, -Has residual diagonal and OM1 stenosis, not felt to be appropriate for CABG based on CVTS assessment in December -Continue Plavix, Coreg, statin, allergy list indicates that she is allergic to aspirin -I started her on Imdur yesterday due to persistent mild chest pain, considerably better now  Acute kidney injury -Baseline creatinine was 1.0, approximately 2 months ago likely cardiorenal, worsened with recent Entresto, diuretics -Creatinine had improved last admission from 2 range to 1.3 at discharge by holding her Entresto, then diuretics were discontinued, at discharge on 1/2 was restarted on Entresto -Creatinine has trended up significantly to 2.5,  2 days back from 1.9, held IV Lasix then and given IV Lasix x1 after blood transfusion yesterday -Creatinine trending down to 2.1 today -Appears euvolemic, hopefully restart oral diuretics tomorrow -Will discontinue Entresto discharge  Anemia due to chronic disease and iron deficiency -Likely multifactorial, volume overloaded state could be contributing to some degree of hemodilution -In addition being on antiplatelets increases risk of bleeding, patient denies any overt blood loss -Anemia panel with iron deficiency, given 1 unit of PRBC and IV iron yesterday -History of colonoscopy 5 years ago which was normal per patient report, no history of melena or hematochezia -Will need oral iron at discharge  Chronic pain -On high-dose morphine and gabapentin -Renally dose gabapentin  Diabetes mellitus -CBGs stable without Lantus which she takes at home, continue sliding scale  DVT prophylaxis: Heparin subcutaneous Code Status: Full code Family Communication: No family at bedside Disposition Plan: Home Pending improvement in acute kidney injury and CHF, likely 48 hours  Consultants:     Procedures:  Antimicrobials:     Subjective: -Further chest pains, mild intermittent dyspnea, overall improving, not short of breath this morning  Objective: Vitals:   02/26/18 1633 02/26/18 1811 02/26/18 1926 02/27/18 0416  BP: (!) 173/79 (!) 163/75 (!) 159/54 (!) 153/72  Pulse: 91 88 89 83  Resp:  18 18 18   Temp:  98.6 F (37 C) 98.2 F (36.8 C) (!) 97.5 F (36.4 C)  TempSrc:  Oral Oral Oral  SpO2:  98% 96% 91%  Weight:    72.8 kg  Height:        Intake/Output Summary (Last 24 hours) at 02/27/2018 1106 Last data filed at 02/27/2018 0800 Gross per 24 hour  Intake 1435.37 ml  Output 900 ml  Net 535.37 ml   Filed Weights   02/24/18 1722 02/26/18 0554 02/27/18 0416  Weight: 72.7 kg 71.1 kg 72.8 kg    Examination:  Gen: Awake, Alert, Oriented X 3, frail chronically ill-appearing female, sitting up in bed, no distress HEENT: PERRLA, Neck supple, no JVD Lungs: Clear bilaterally CVS: RRR,No Gallops,Rubs or new Murmurs Abd: soft, Non tender, non distended, BS present Extremities: No edema Skin: no new rashes  Data Reviewed:   CBC: Recent Labs  Lab 02/23/18 0513 02/23/18 1449 02/24/18 0020 02/25/18 0028 02/26/18 0457 02/26/18 2025 02/27/18 0448  WBC 7.2 7.7 6.8 5.4 5.6  --  4.9  NEUTROABS 3.4  --   --   --   --   --   --   HGB 8.2* 9.3* 8.0* 7.6* 7.6* 9.0* 8.9*  HCT 26.3* 29.5* 25.5* 23.8* 23.7* 27.7* 27.7*  MCV 93.6 90.2 91.1 92.6 91.5  --  91.4  PLT 193 225 210 191 181  --  326   Basic Metabolic Panel: Recent Labs  Lab 02/23/18 0513 02/23/18 1449 02/23/18 2020 02/24/18 0020 02/25/18 0028 02/26/18 0457 02/27/18 0448  NA 141  --  139 139 138 140 140  K 5.5*  --  5.3* 5.4* 5.6* 5.6* 5.0  CL 105  --  105 104 104 101 101  CO2 23  --  25 26 28 27 26   GLUCOSE 159*  --  181* 136* 155* 162* 117*  BUN 52*  --  51* 51* 52* 54* 54*  CREATININE 1.78* 1.73* 1.68* 1.99* 2.53* 2.30* 2.15*  CALCIUM 8.6*  --  8.4* 8.5* 8.3* 8.7* 8.8*  MG  --  1.7  --   --   --   --   --   PHOS 4.9* 4.7* 4.4   --   --   --   --    GFR: Estimated Creatinine Clearance: 25.2 mL/min (A) (by C-G formula based on SCr of 2.15 mg/dL (H)). Liver Function Tests: Recent Labs  Lab 02/23/18 0513 02/23/18 2020  ALBUMIN 2.6* 2.4*   No results for input(s): LIPASE, AMYLASE in the last 168 hours. No results for input(s): AMMONIA in the last 168 hours. Coagulation Profile: No results for input(s): INR, PROTIME in the last 168 hours. Cardiac Enzymes: Recent Labs  Lab 02/23/18 0513 02/23/18 1449 02/23/18 2020 02/24/18 0020  TROPONINI <0.03 <0.03 <0.03 <0.03   BNP (last 3 results) No results for input(s): PROBNP in the last 8760 hours. HbA1C: No results for input(s): HGBA1C in the last 72 hours. CBG: Recent Labs  Lab 02/26/18 0727 02/26/18 1111 02/26/18 1617 02/26/18 2124 02/27/18 0713  GLUCAP 127* 118* 177* 130* 99   Lipid Profile: No results for input(s): CHOL, HDL, LDLCALC, TRIG, CHOLHDL, LDLDIRECT in the last  72 hours. Thyroid Function Tests: No results for input(s): TSH, T4TOTAL, FREET4, T3FREE, THYROIDAB in the last 72 hours. Anemia Panel: Recent Labs    02/25/18 0753  VITAMINB12 283  FOLATE 11.2  FERRITIN 125  TIBC 246*  IRON 38  RETICCTPCT 2.4   Urine analysis:    Component Value Date/Time   COLORURINE YELLOW 02/25/2018 2316   APPEARANCEUR HAZY (A) 02/25/2018 2316   LABSPEC 1.014 02/25/2018 2316   PHURINE 5.0 02/25/2018 2316   GLUCOSEU NEGATIVE 02/25/2018 2316   HGBUR SMALL (A) 02/25/2018 2316   BILIRUBINUR NEGATIVE 02/25/2018 2316   KETONESUR NEGATIVE 02/25/2018 2316   PROTEINUR >=300 (A) 02/25/2018 2316   UROBILINOGEN 1.0 12/02/2013 1612   NITRITE NEGATIVE 02/25/2018 2316   LEUKOCYTESUR SMALL (A) 02/25/2018 2316   Sepsis Labs: @LABRCNTIP (procalcitonin:4,lacticidven:4)  )No results found for this or any previous visit (from the past 240 hour(s)).       Radiology Studies: No results found.      Scheduled Meds: . atorvastatin  80 mg Oral QHS  .  carvedilol  6.25 mg Oral BID WC  . clopidogrel  75 mg Oral Daily  . divalproex  1,000 mg Oral QHS  . heparin  5,000 Units Subcutaneous Q8H  . insulin aspart  0-5 Units Subcutaneous QHS  . insulin aspart  0-9 Units Subcutaneous TID WC  . isosorbide mononitrate  15 mg Oral Daily  . levothyroxine  200 mcg Oral QAC breakfast  . morphine  30 mg Oral BID  . polyethylene glycol  17 g Oral Daily  . pregabalin  75 mg Oral Daily  . Vitamin D (Ergocalciferol)  50,000 Units Oral Q7 days   Continuous Infusions: . sodium chloride 150 mL/hr at 02/26/18 1616     LOS: 3 days    Time spent: 17min    Domenic Polite, MD [facility]  02/27/2018, 11:06 AM

## 2018-02-27 NOTE — Progress Notes (Signed)
Physical Therapy Treatment Patient Details Name: Andrea [name] MRN: [id number] DOB: December 30, 1950 Today's Date: 02/27/2018    History of Present Illness Pt is a 68 y.o. female admitted 02/23/18 with chest pain and SOB; CXR concerning for pulmonary edema. Worked up for CHF. Of note, four recent admissions since 12/2017 with similar symptoms, s/p cardiac cath and PCI. PMH includes chronic pain, DM, anxiety/depression.   PT Comments    Pt progressing well with mobility. Ambulatory with RW and supervision for safety; improved safety awareness and self-monitoring of activity tolerance, although still requires intermittent cues for this.   Follow Up Recommendations  Home health PT;Supervision - Intermittent     Equipment Recommendations  None recommended by PT    Recommendations for Other Services       Precautions / Restrictions Precautions Precautions: Fall Restrictions Weight Bearing Restrictions: No    Mobility  Bed Mobility Overal bed mobility: Modified Independent                Transfers Overall transfer level: Modified independent Equipment used: Rolling walker (2 wheeled) Transfers: Sit to/from Stand              Ambulation/Gait Ambulation/Gait assistance: Supervision Gait Distance (Feet): 350 Feet Assistive device: Rolling walker (2 wheeled) Gait Pattern/deviations: Step-through pattern;Decreased stride length;Staggering right Gait velocity: Decreased Gait velocity interpretation: 1.31 - 2.62 ft/sec, indicative of limited community ambulator General Gait Details: Slow, mostly steady gait with RW and supervision for safety. 1x staggerring R with c/o dizziness; pt cued to stand and recover, and stop looking around/try to focus vision; generally seems due to poor safety awareness/problem solving.    Stairs             Wheelchair Mobility    Modified Rankin (Stroke Patients Only)       Balance Overall balance assessment: Needs  assistance Sitting-balance support: No upper extremity supported;Feet supported Sitting balance-Leahy Scale: Good     Standing balance support: Bilateral upper extremity supported;During functional activity Standing balance-Leahy Scale: Fair Standing balance comment: Can static stand and take steps without UE support                            Cognition Arousal/Alertness: Awake/alert Behavior During Therapy: WFL for tasks assessed/performed Overall Cognitive Status: No family/caregiver present to determine baseline cognitive functioning Area of Impairment: Safety/judgement;Memory;Problem solving;Attention                   Current Attention Level: Alternating Memory: Decreased short-term memory   Safety/Judgement: Decreased awareness of safety;Decreased awareness of deficits   Problem Solving: Requires verbal cues        Exercises      General Comments General comments (skin integrity, edema, etc.): SpO2 down to 86% ambulating on RA, recovering to 91% with seated rest      Pertinent Vitals/Pain Pain Assessment: No/denies pain    Home Living                      Prior Function            PT Goals (current goals can now be found in the care plan section) Acute Rehab PT Goals Patient Stated Goal: to get better PT Goal Formulation: With patient Time For Goal Achievement: 03/11/18 Potential to Achieve Goals: Good Progress towards PT goals: Progressing toward goals    Frequency    Min 3X/week      PT Plan Current plan  remains appropriate    Co-evaluation              AM-PAC PT "6 Clicks" Mobility   Outcome Measure  Help needed turning from your back to your side while in a flat bed without using bedrails?: None Help needed moving from lying on your back to sitting on the side of a flat bed without using bedrails?: None Help needed moving to and from a bed to a chair (including a wheelchair)?: None Help needed standing up  from a chair using your arms (e.g., wheelchair or bedside chair)?: A Little Help needed to walk in hospital room?: A Little Help needed climbing 3-5 steps with a railing? : A Little 6 Click Score: 21    End of Session Equipment Utilized During Treatment: Gait belt;Oxygen Activity Tolerance: Patient tolerated treatment well Patient left: in bed;with call bell/phone within reach Nurse Communication: Mobility status PT Visit Diagnosis: Unsteadiness on feet (R26.81);Muscle weakness (generalized) (M62.81)     Time: 2774-1287 PT Time Calculation (min) (ACUTE ONLY): 12 min  Charges:  $Gait Training: 8-22 mins                    Mabeline Caras, PT, DPT Acute Rehabilitation Services  Pager [phone] Office [place] 02/27/2018, 9:43 AM

## 2018-02-27 NOTE — Care Management Important Message (Signed)
Important Message  Patient Details  Name: Andrea [name] MRN: [id number] Date of Birth: [birth year]   Medicare Important Message Given:  Yes    Minna Dumire P [place] 02/27/2018, 1:22 PM

## 2018-02-27 NOTE — Plan of Care (Signed)
  Problem: Education: Goal: Ability to demonstrate management of disease process will improve Outcome: Progressing   Problem: Activity: Goal: Capacity to carry out activities will improve Outcome: Progressing   

## 2018-02-28 LAB — CBC
HCT: 27.7 % — ABNORMAL LOW (ref 36.0–46.0)
Hemoglobin: 8.7 g/dL — ABNORMAL LOW (ref 12.0–15.0)
MCH: 29.1 pg (ref 26.0–34.0)
MCHC: 31.4 g/dL (ref 30.0–36.0)
MCV: 92.6 fL (ref 80.0–100.0)
PLATELETS: 181 10*3/uL (ref 150–400)
RBC: 2.99 MIL/uL — ABNORMAL LOW (ref 3.87–5.11)
RDW: 13.2 % (ref 11.5–15.5)
WBC: 4.8 10*3/uL (ref 4.0–10.5)
nRBC: 0 % (ref 0.0–0.2)

## 2018-02-28 LAB — BASIC METABOLIC PANEL
Anion gap: 6 (ref 5–15)
BUN: 56 mg/dL — ABNORMAL HIGH (ref 8–23)
CO2: 29 mmol/L (ref 22–32)
Calcium: 8.5 mg/dL — ABNORMAL LOW (ref 8.9–10.3)
Chloride: 106 mmol/L (ref 98–111)
Creatinine, Ser: 2.01 mg/dL — ABNORMAL HIGH (ref 0.44–1.00)
GFR calc Af Amer: 29 mL/min — ABNORMAL LOW (ref >60)
GFR, EST NON AFRICAN AMERICAN: 25 mL/min — AB (ref >60)
Glucose, Bld: 101 mg/dL — ABNORMAL HIGH (ref 70–99)
Potassium: 5.1 mmol/L (ref 3.5–5.1)
SODIUM: 141 mmol/L (ref 135–145)

## 2018-02-28 LAB — GLUCOSE, CAPILLARY
Glucose-Capillary: 89 mg/dL (ref 70–99)
Glucose-Capillary: 115 mg/dL — ABNORMAL HIGH (ref 70–99)
Glucose-Capillary: 145 mg/dL — ABNORMAL HIGH (ref 70–99)
Glucose-Capillary: 124 mg/dL — ABNORMAL HIGH (ref 70–99)

## 2018-02-28 MED ORDER — LORAZEPAM 0.5 MG PO TABS
0.5000 mg | ORAL_TABLET | Freq: Once | ORAL | Status: AC
  Administered 2018-02-28: 0.5 mg via ORAL
  Filled 2018-02-28: qty 1

## 2018-02-28 MED ORDER — FUROSEMIDE 40 MG PO TABS
40.0000 mg | ORAL_TABLET | Freq: Every day | ORAL | Status: DC
  Administered 2018-02-28: 40 mg via ORAL
  Filled 2018-02-28 (×2): qty 1

## 2018-02-28 NOTE — Progress Notes (Signed)
PROGRESS NOTE    Andrea [name]  RSW:[id number] DOB: 07/08/1950 DOA: 02/23/2018 PCP: Medicine, [facility]  Brief Narrative: 68 year old female with history of chronic pain, insulin-dependent diabetes, anxiety/depression has been admitted 4 times since December with chest pain and shortness of breath.  Originally admitted in early December, found to have severe ischemic cardiomyopathy with EF of 30 to 35%, underwent diagnostic left heart catheterization in December which revealed severe three-vessel disease, initially CABG was considered but PCI felt to be best therapy, underwent stenting to proximal and distal LAD on 12/30/2017, has residual 90% diagonal and 80% OM1 stenosis . -Discharged home on aspirin and Brilinta quickly readmitted with chest pain on 12/15 and discharged the following day after she was ruled out for MI. -After this she was admitted from 12/28 to 1/2 with chest pain and shortness of breath, she was seen by cardiology, her Kary Kos was changed to Effient since it was suspected to be contributing to her dyspnea in addition she also had acute kidney injury and hence Lasix and Aldactone were held, she was restarted on Entresto at discharge with a creatinine of 1.3 on 1/2. -Subsequently readmitted on 2/3 with worsening shortness of breath, chest pain chest x-ray concerning for pulmonary edema  Assessment & Plan:   Acute on chronic systolic CHF -Last echo with EF of 30 to 35% -Initially improved with diuresis however had significant worsening of creatinine to up to 2.5 range, hence diuretics were held, Delene Loll was discontinued on admission due to worsening renal failure -Subsequently received 1 unit of PRBC and 40 mg of IV Lasix -Clinically appears close to euvolemic now -Restart oral Lasix today -Wean O2 -Also has hypoalbuminemia which also makes volume management challenging  History of multivessel CAD -History of proximal and distal LAD stent on  12/10, -Has residual diagonal and OM1 stenosis, not felt to be appropriate for CABG based on CVTS assessment in December -Continue Plavix, Coreg, statin, allergy list indicates that she is allergic to aspirin -I also started her on Imdur for persistent chest pain  Acute kidney injury -Baseline creatinine was 1.0, approximately 2 months ago likely cardiorenal, worsened with recent Entresto, diuretics -Creatinine had improved last admission from 2 range to 1.3 at discharge by holding her Entresto, then diuretics were discontinued, at discharge on 1/2 was restarted on Entresto -Creatinine has trended up significantly to 2.5,  2 days back from 1.9, held IV Lasix then and given IV Lasix x1 after blood transfusion yesterday -Creatinine trending down to 2.0 -Appears euvolemic, restart oral Lasix today -Will discontinue Entresto discharge  Anemia due to chronic disease and iron deficiency -Likely multifactorial, volume overloaded state could be contributing to some degree of hemodilution -In addition being on antiplatelets increases risk of bleeding, patient denies any overt blood loss -Anemia panel with iron deficiency, given 1 unit of PRBC and IV iron yesterday -History of colonoscopy 5 years ago which was normal per patient report, no history of melena or hematochezia -Will need oral iron at discharge  Vomiting -Large volume this afternoon -Abdominal exam appears benign -Supportive care, monitor  Chronic pain -On high-dose morphine and Lyrica -Renally dose Lyrica  Mild cognitive dysfunction -Higher functions limited likely secondary to high-dose narcotics and Lyrica  Diabetes mellitus -CBGs stable without Lantus which she takes at home, continue sliding scale  DVT prophylaxis: Heparin subcutaneous Code Status: Full code Family Communication: No family at bedside Disposition Plan: Home hopefully tomorrow if no further vomiting and kidney function continues to be  stable/improved  Consultants:     Procedures:   Antimicrobials:    Subjective: -Breathing better, one episode of vomiting large-volume this afternoon  Objective: Vitals:   02/27/18 1139 02/27/18 2050 02/28/18 0428 02/28/18 1209  BP: (!) 180/68 (!) 181/75 (!) 147/69 (!) 142/66  Pulse: 88 85 79 86  Resp: 20 18 18 18   Temp: 98.4 F (36.9 C) 97.9 F (36.6 C) 97.9 F (36.6 C) 99 F (37.2 C)  TempSrc: Oral Oral Oral Oral  SpO2: 95% 98% 97% 97%  Weight:   73 kg   Height:        Intake/Output Summary (Last 24 hours) at 02/28/2018 1404 Last data filed at 02/28/2018 1018 Gross per 24 hour  Intake 660 ml  Output 500 ml  Net 160 ml   Filed Weights   02/26/18 0554 02/27/18 0416 02/28/18 0428  Weight: 71.1 kg 72.8 kg 73 kg    Examination:  Gen: Awake, Alert, Oriented X 2, chronically ill appearing, no distress HEENT: PERRLA, Neck supple, no JVD Lungs: rare basilar crackles CVS: RRR,No Gallops,Rubs or new Murmurs Abd: soft, Non tender, non distended, BS present Extremities: No edema Skin: no new rashes  Data Reviewed:   CBC: Recent Labs  Lab 02/23/18 0513  02/24/18 0020 02/25/18 0028 02/26/18 0457 02/26/18 2025 02/27/18 0448 02/28/18 0541  WBC 7.2   < > 6.8 5.4 5.6  --  4.9 4.8  NEUTROABS 3.4  --   --   --   --   --   --   --   HGB 8.2*   < > 8.0* 7.6* 7.6* 9.0* 8.9* 8.7*  HCT 26.3*   < > 25.5* 23.8* 23.7* 27.7* 27.7* 27.7*  MCV 93.6   < > 91.1 92.6 91.5  --  91.4 92.6  PLT 193   < > 210 191 181  --  174 181   < > = values in this interval not displayed.   Basic Metabolic Panel: Recent Labs  Lab 02/23/18 0513 02/23/18 1449 02/23/18 2020 02/24/18 0020 02/25/18 0028 02/26/18 0457 02/27/18 0448 02/28/18 0541  NA 141  --  139 139 138 140 140 141  K 5.5*  --  5.3* 5.4* 5.6* 5.6* 5.0 5.1  CL 105  --  105 104 104 101 101 106  CO2 23  --  25 26 28 27 26 29   GLUCOSE 159*  --  181* 136* 155* 162* 117* 101*  BUN 52*  --  51* 51* 52* 54* 54* 56*  CREATININE  1.78* 1.73* 1.68* 1.99* 2.53* 2.30* 2.15* 2.01*  CALCIUM 8.6*  --  8.4* 8.5* 8.3* 8.7* 8.8* 8.5*  MG  --  1.7  --   --   --   --   --   --   PHOS 4.9* 4.7* 4.4  --   --   --   --   --    GFR: Estimated Creatinine Clearance: 26.9 mL/min (A) (by C-G formula based on SCr of 2.01 mg/dL (H)). Liver Function Tests: Recent Labs  Lab 02/23/18 0513 02/23/18 2020  ALBUMIN 2.6* 2.4*   No results for input(s): LIPASE, AMYLASE in the last 168 hours. No results for input(s): AMMONIA in the last 168 hours. Coagulation Profile: No results for input(s): INR, PROTIME in the last 168 hours. Cardiac Enzymes: Recent Labs  Lab 02/23/18 0513 02/23/18 1449 02/23/18 2020 02/24/18 0020  TROPONINI <0.03 <0.03 <0.03 <0.03   BNP (last 3 results) No results for input(s): PROBNP in the last 8760  hours. HbA1C: No results for input(s): HGBA1C in the last 72 hours. CBG: Recent Labs  Lab 02/27/18 1143 02/27/18 1619 02/27/18 2106 02/28/18 0732 02/28/18 1210  GLUCAP 120* 201* 127* 89 115*   Lipid Profile: No results for input(s): CHOL, HDL, LDLCALC, TRIG, CHOLHDL, LDLDIRECT in the last 72 hours. Thyroid Function Tests: No results for input(s): TSH, T4TOTAL, FREET4, T3FREE, THYROIDAB in the last 72 hours. Anemia Panel: No results for input(s): VITAMINB12, FOLATE, FERRITIN, TIBC, IRON, RETICCTPCT in the last 72 hours. Urine analysis:    Component Value Date/Time   COLORURINE YELLOW 02/25/2018 2316   APPEARANCEUR HAZY (A) 02/25/2018 2316   LABSPEC 1.014 02/25/2018 2316   PHURINE 5.0 02/25/2018 2316   GLUCOSEU NEGATIVE 02/25/2018 2316   HGBUR SMALL (A) 02/25/2018 2316   BILIRUBINUR NEGATIVE 02/25/2018 2316   KETONESUR NEGATIVE 02/25/2018 2316   PROTEINUR >=300 (A) 02/25/2018 2316   UROBILINOGEN 1.0 12/02/2013 1612   NITRITE NEGATIVE 02/25/2018 2316   LEUKOCYTESUR SMALL (A) 02/25/2018 2316   Sepsis Labs: @LABRCNTIP (procalcitonin:4,lacticidven:4)  )No results found for this or any previous  visit (from the past 240 hour(s)).       Radiology Studies: No results found.      Scheduled Meds: . atorvastatin  80 mg Oral QHS  . carvedilol  6.25 mg Oral BID WC  . clopidogrel  75 mg Oral Daily  . divalproex  1,000 mg Oral QHS  . furosemide  40 mg Oral Daily  . heparin  5,000 Units Subcutaneous Q8H  . insulin aspart  0-5 Units Subcutaneous QHS  . insulin aspart  0-9 Units Subcutaneous TID WC  . isosorbide mononitrate  15 mg Oral Daily  . levothyroxine  200 mcg Oral QAC breakfast  . morphine  30 mg Oral BID  . polyethylene glycol  17 g Oral Daily  . pregabalin  75 mg Oral Daily  . Vitamin D (Ergocalciferol)  50,000 Units Oral Q7 days   Continuous Infusions: . sodium chloride 150 mL/hr at 02/26/18 1616     LOS: 4 days    Time spent: 41min    Domenic Polite, MD [facility]  02/28/2018, 2:04 PM

## 2018-02-28 NOTE — Progress Notes (Signed)
No further complaints or reports of emesis from patient.   Patient taking her dinner without difficulty.

## 2018-02-28 NOTE — Progress Notes (Signed)
Patient states to MD that she has been vomiting all week since admission.  This information has not been shared from patient to nursing staff.  Patient this am was commenting on how she wasn't getting enough food on her trays and how could she get more.   Patient currently awake and voices no c/o. She states she ate all of her lunch.   Will observe closely.

## 2018-03-01 ENCOUNTER — Inpatient Hospital Stay (HOSPITAL_COMMUNITY): Payer: Medicare Other

## 2018-03-01 LAB — BASIC METABOLIC PANEL
Anion gap: 11 (ref 5–15)
BUN: 56 mg/dL — AB (ref 8–23)
CO2: 28 mmol/L (ref 22–32)
Calcium: 8.5 mg/dL — ABNORMAL LOW (ref 8.9–10.3)
Chloride: 102 mmol/L (ref 98–111)
Creatinine, Ser: 1.87 mg/dL — ABNORMAL HIGH (ref 0.44–1.00)
GFR calc Af Amer: 32 mL/min — ABNORMAL LOW (ref >60)
GFR calc non Af Amer: 28 mL/min — ABNORMAL LOW (ref >60)
Glucose, Bld: 112 mg/dL — ABNORMAL HIGH (ref 70–99)
Potassium: 4.6 mmol/L (ref 3.5–5.1)
Sodium: 141 mmol/L (ref 135–145)

## 2018-03-01 LAB — CBC
HCT: 27.3 % — ABNORMAL LOW (ref 36.0–46.0)
Hemoglobin: 8.7 g/dL — ABNORMAL LOW (ref 12.0–15.0)
MCH: 29.4 pg (ref 26.0–34.0)
MCHC: 31.9 g/dL (ref 30.0–36.0)
MCV: 92.2 fL (ref 80.0–100.0)
Platelets: 169 10*3/uL (ref 150–400)
RBC: 2.96 MIL/uL — ABNORMAL LOW (ref 3.87–5.11)
RDW: 12.8 % (ref 11.5–15.5)
WBC: 4.1 10*3/uL (ref 4.0–10.5)
nRBC: 0 % (ref 0.0–0.2)

## 2018-03-01 LAB — HEPATIC FUNCTION PANEL
ALBUMIN: 2.4 g/dL — AB (ref 3.5–5.0)
ALK PHOS: 34 U/L — AB (ref 38–126)
ALT: 15 U/L (ref 0–44)
AST: 30 U/L (ref 15–41)
Bilirubin, Direct: <0.1 mg/dL (ref 0.0–0.2)
Total Bilirubin: 0.8 mg/dL (ref 0.3–1.2)
Total Protein: 6 g/dL — ABNORMAL LOW (ref 6.5–8.1)

## 2018-03-01 LAB — GLUCOSE, CAPILLARY
Glucose-Capillary: 101 mg/dL — ABNORMAL HIGH (ref 70–99)
Glucose-Capillary: 126 mg/dL — ABNORMAL HIGH (ref 70–99)
Glucose-Capillary: 108 mg/dL — ABNORMAL HIGH (ref 70–99)
Glucose-Capillary: 138 mg/dL — ABNORMAL HIGH (ref 70–99)

## 2018-03-01 MED ORDER — FUROSEMIDE 10 MG/ML IJ SOLN
40.0000 mg | Freq: Once | INTRAMUSCULAR | Status: AC
  Administered 2018-03-01: 40 mg via INTRAVENOUS
  Filled 2018-03-01: qty 4

## 2018-03-01 MED ORDER — MORPHINE SULFATE ER 15 MG PO TBCR
15.0000 mg | EXTENDED_RELEASE_TABLET | Freq: Two times a day (BID) | ORAL | Status: DC
  Administered 2018-03-01 – 2018-03-07 (×13): 15 mg via ORAL
  Filled 2018-03-01 (×13): qty 1

## 2018-03-01 MED ORDER — MORPHINE SULFATE (PF) 2 MG/ML IV SOLN: 1.0000 mg | INTRAVENOUS | Status: DC | PRN

## 2018-03-01 NOTE — Progress Notes (Signed)
PROGRESS NOTE    Andrea [name]  CMK:[id number] DOB: Feb 16, 1950 DOA: 02/23/2018 PCP: Medicine, [facility]  Brief Narrative: 68 year old female with history of chronic pain, insulin-dependent diabetes, anxiety/depression has been admitted 4 times since December with chest pain and shortness of breath.  Originally admitted in early December, found to have severe ischemic cardiomyopathy with EF of 30 to 35%, underwent diagnostic left heart catheterization in December which revealed severe three-vessel disease, initially CABG was considered but PCI felt to be best therapy, underwent stenting to proximal and distal LAD on 12/30/2017, has residual 90% diagonal and 80% OM1 stenosis . -Discharged home on aspirin and Brilinta quickly readmitted with chest pain on 12/15 and discharged the following day after she was ruled out for MI. -After this she was admitted from 12/28 to 1/2 with chest pain and shortness of breath, she was seen by cardiology, her Kary Kos was changed to Effient since it was suspected to be contributing to her dyspnea in addition she also had acute kidney injury and hence Lasix and Aldactone were held, Delene Loll was held , subsequently restarted on Entresto at discharge with a creatinine of 1.3 on 1/2. -Subsequently readmitted on 2/3 with worsening shortness of breath, chest pain chest x-ray concerning for pulmonary edema, creatinine around 1.7-1.9 -Diuresed with IV Lasix, creatinine worsened to 2.5, diuretics decreased -Also given 1 unit of PRBC for worsening anemia, creatinine subsequently improving -2/8 and 2/9 with intermittent vomiting, per patient report ongoing for a few weeks off and on at home  Assessment & Plan:   Acute on chronic systolic CHF -Last echo with EF of 30 to 35% -Initially improved with diuresis however had significant worsening of creatinine to up to 2.5 range, hence diuretics were held, Delene Loll was discontinued on admission due to worsening  renal failure -Subsequently received 1 unit of PRBC and 40 mg of IV Lasix -Clinically appears close to euvolemic now -Started on oral Lasix yesterday -Wean O2 -Also has hypoalbuminemia which also makes volume management challenging  History of multivessel CAD -History of proximal and distal LAD stent on 12/10, -Has residual diagonal and OM1 stenosis, not felt to be appropriate for CABG based on CVTS assessment in December -Continue Plavix, Coreg, statin, allergy list indicates that she is allergic to aspirin -I also started her on Imdur for persistent chest pain, which has improved her symptoms to some extent  Acute kidney injury -Baseline creatinine was 1.0, approximately 2 months ago likely cardiorenal, worsened with recent Entresto, diuretics -Creatinine had improved last admission from 2 range to 1.3 at discharge by holding her Entresto, then diuretics were discontinued, at discharge on 1/2 was restarted on Entresto -Creatinine has trended up significantly to 2.5,  2 days back from 1.9, held IV Lasix then  -Creatinine trending down to 1.7 -Appears euvolemic, restarted on oral Lasix yesterday -Will discontinue Entresto discharge  Anemia due to chronic disease and iron deficiency -Likely multifactorial, volume overloaded state could be contributing to some degree of hemodilution -In addition being on antiplatelets increases risk of bleeding, patient denies any overt blood loss -Anemia panel with iron deficiency, given 1 unit of PRBC and IV iron yesterday -History of colonoscopy 5 years ago which was normal per patient report, no history of melena or hematochezia -Will need oral iron at discharge  Intermittent vomiting -Witnessed one episode yesterday afternoon and subsequently had 2 more today -Patient reports intermittent for 2 weeks, I suspect some of her medications could be playing a role in this, her long-acting morphine dose  was increased few months ago from 15 to 30 mg 3 times  daily -We will decrease this back to 15 mg -Due to persistence of symptoms we will check LFTs and CT abdomen without contrast -Supportive care  Chronic pain -On high-dose morphine and Lyrica -Renally dose Lyrica -deccreased morphine dose as above  Mild cognitive dysfunction -Higher functions limited likely secondary to high-dose narcotics and Lyrica  Diabetes mellitus -CBGs stable without Lantus which she takes at home, continue sliding scale  DVT prophylaxis: Heparin subcutaneous Code Status: Full code Family Communication: No family at bedside Disposition Plan: Home when vomiting has improved and respiratory status is stable  Consultants:     Procedures:   Antimicrobials:    Subjective: -Mild dyspnea with exertion however breathing overall better -2 episodes of vomiting this morning and one large one yesterday  Objective: Vitals:   03/01/18 0442 03/01/18 0533 03/01/18 0654 03/01/18 0857  BP: (!) 168/76 139/78  (!) 147/91  Pulse: 92 92  95  Resp: 18     Temp: 97.6 F (36.4 C) 98.8 F (37.1 C)    TempSrc: Oral Oral    SpO2: 95% 97%  92%  Weight:   71.9 kg   Height:        Intake/Output Summary (Last 24 hours) at 03/01/2018 1107 Last data filed at 02/28/2018 2349 Gross per 24 hour  Intake 390 ml  Output 600 ml  Net -210 ml   Filed Weights   02/27/18 0416 02/28/18 0428 03/01/18 0654  Weight: 72.8 kg 73 kg 71.9 kg    Examination:  Gen: Awake, Alert, Oriented X 3,  HEENT: PERRLA, Neck supple, no JVD Lungs: Rare basilar crackles CVS: RRR,No Gallops,Rubs or new Murmurs Abd: soft, Non tender, non distended, BS present Extremities: No edema  skin: no new rashes  Data Reviewed:   CBC: Recent Labs  Lab 02/23/18 0513  02/25/18 0028 02/26/18 0457 02/26/18 2025 02/27/18 0448 02/28/18 0541 03/01/18 0434  WBC 7.2   < > 5.4 5.6  --  4.9 4.8 4.1  NEUTROABS 3.4  --   --   --   --   --   --   --   HGB 8.2*   < > 7.6* 7.6* 9.0* 8.9* 8.7* 8.7*  HCT 26.3*    < > 23.8* 23.7* 27.7* 27.7* 27.7* 27.3*  MCV 93.6   < > 92.6 91.5  --  91.4 92.6 92.2  PLT 193   < > 191 181  --  174 181 169   < > = values in this interval not displayed.   Basic Metabolic Panel: Recent Labs  Lab 02/23/18 0513 02/23/18 1449 02/23/18 2020  02/25/18 0028 02/26/18 0457 02/27/18 0448 02/28/18 0541 03/01/18 0434  NA 141  --  139   < > 138 140 140 141 141  K 5.5*  --  5.3*   < > 5.6* 5.6* 5.0 5.1 4.6  CL 105  --  105   < > 104 101 101 106 102  CO2 23  --  25   < > 28 27 26 29 28   GLUCOSE 159*  --  181*   < > 155* 162* 117* 101* 112*  BUN 52*  --  51*   < > 52* 54* 54* 56* 56*  CREATININE 1.78* 1.73* 1.68*   < > 2.53* 2.30* 2.15* 2.01* 1.87*  CALCIUM 8.6*  --  8.4*   < > 8.3* 8.7* 8.8* 8.5* 8.5*  MG  --  1.7  --   --   --   --   --   --   --  PHOS 4.9* 4.7* 4.4  --   --   --   --   --   --    < > = values in this interval not displayed.   GFR: Estimated Creatinine Clearance: 28.8 mL/min (A) (by C-G formula based on SCr of 1.87 mg/dL (H)). Liver Function Tests: Recent Labs  Lab 02/23/18 0513 02/23/18 2020  ALBUMIN 2.6* 2.4*   No results for input(s): LIPASE, AMYLASE in the last 168 hours. No results for input(s): AMMONIA in the last 168 hours. Coagulation Profile: No results for input(s): INR, PROTIME in the last 168 hours. Cardiac Enzymes: Recent Labs  Lab 02/23/18 0513 02/23/18 1449 02/23/18 2020 02/24/18 0020  TROPONINI <0.03 <0.03 <0.03 <0.03   BNP (last 3 results) No results for input(s): PROBNP in the last 8760 hours. HbA1C: No results for input(s): HGBA1C in the last 72 hours. CBG: Recent Labs  Lab 02/28/18 0732 02/28/18 1210 02/28/18 1643 02/28/18 2155 03/01/18 0757  GLUCAP 89 115* 145* 124* 101*   Lipid Profile: No results for input(s): CHOL, HDL, LDLCALC, TRIG, CHOLHDL, LDLDIRECT in the last 72 hours. Thyroid Function Tests: No results for input(s): TSH, T4TOTAL, FREET4, T3FREE, THYROIDAB in the last 72 hours. Anemia Panel: No  results for input(s): VITAMINB12, FOLATE, FERRITIN, TIBC, IRON, RETICCTPCT in the last 72 hours. Urine analysis:    Component Value Date/Time   COLORURINE YELLOW 02/25/2018 2316   APPEARANCEUR HAZY (A) 02/25/2018 2316   LABSPEC 1.014 02/25/2018 2316   PHURINE 5.0 02/25/2018 2316   GLUCOSEU NEGATIVE 02/25/2018 2316   HGBUR SMALL (A) 02/25/2018 2316   BILIRUBINUR NEGATIVE 02/25/2018 2316   KETONESUR NEGATIVE 02/25/2018 2316   PROTEINUR >=300 (A) 02/25/2018 2316   UROBILINOGEN 1.0 12/02/2013 1612   NITRITE NEGATIVE 02/25/2018 2316   LEUKOCYTESUR SMALL (A) 02/25/2018 2316   Sepsis Labs: @LABRCNTIP (procalcitonin:4,lacticidven:4)  )No results found for this or any previous visit (from the past 240 hour(s)).       Radiology Studies: No results found.      Scheduled Meds: . atorvastatin  80 mg Oral QHS  . carvedilol  6.25 mg Oral BID WC  . clopidogrel  75 mg Oral Daily  . divalproex  1,000 mg Oral QHS  . heparin  5,000 Units Subcutaneous Q8H  . insulin aspart  0-5 Units Subcutaneous QHS  . insulin aspart  0-9 Units Subcutaneous TID WC  . isosorbide mononitrate  15 mg Oral Daily  . levothyroxine  200 mcg Oral QAC breakfast  . morphine  15 mg Oral BID  . polyethylene glycol  17 g Oral Daily  . pregabalin  75 mg Oral Daily  . Vitamin D (Ergocalciferol)  50,000 Units Oral Q7 days   Continuous Infusions: . sodium chloride 150 mL/hr at 02/26/18 1616     LOS: 5 days    Time spent: 12min    Domenic Polite, MD [facility]  03/01/2018, 11:07 AM

## 2018-03-01 NOTE — Progress Notes (Signed)
Pt vomiting this morning- MD aware- will delay oral medications until later.   Gave IV zofran

## 2018-03-01 NOTE — Progress Notes (Signed)
Pt was able to hold down other medications-  Gave the other daily medication and her carvedilol at this time.   Encouraged pt to eat and drink slowly in small amounts.    Page doctor to let her know.

## 2018-03-01 NOTE — Progress Notes (Signed)
Pt is still vomiting.   MD is aware.   MD placed order for CT abd.  Still holding PO medications.   Will hold insulin since sugar is only 126 with pt vomiting.  Will continue to monitor.  Afternoon vitals (BP) elevated-  Cannot give oral medication yet.  Paged MD

## 2018-03-01 NOTE — Progress Notes (Signed)
Pt returned from CT-  Has not vomited since around 12 pm.    Pt having a lot of pain.   Will give Imdur and Morphine now.   Will reassess again later for other medications.

## 2018-03-02 LAB — COMPREHENSIVE METABOLIC PANEL
ALT: 20 U/L (ref 0–44)
AST: 38 U/L (ref 15–41)
Albumin: 2.6 g/dL — ABNORMAL LOW (ref 3.5–5.0)
Alkaline Phosphatase: 35 U/L — ABNORMAL LOW (ref 38–126)
Anion gap: 10 (ref 5–15)
BUN: 59 mg/dL — AB (ref 8–23)
CO2: 25 mmol/L (ref 22–32)
Calcium: 8.4 mg/dL — ABNORMAL LOW (ref 8.9–10.3)
Chloride: 107 mmol/L (ref 98–111)
Creatinine, Ser: 2.27 mg/dL — ABNORMAL HIGH (ref 0.44–1.00)
GFR calc Af Amer: 25 mL/min — ABNORMAL LOW (ref >60)
GFR, EST NON AFRICAN AMERICAN: 22 mL/min — AB (ref >60)
Glucose, Bld: 119 mg/dL — ABNORMAL HIGH (ref 70–99)
Potassium: 4.7 mmol/L (ref 3.5–5.1)
Sodium: 142 mmol/L (ref 135–145)
Total Bilirubin: 0.8 mg/dL (ref 0.3–1.2)
Total Protein: 6.3 g/dL — ABNORMAL LOW (ref 6.5–8.1)

## 2018-03-02 LAB — CBC
HCT: 27.9 % — ABNORMAL LOW (ref 36.0–46.0)
Hemoglobin: 8.9 g/dL — ABNORMAL LOW (ref 12.0–15.0)
MCH: 29.8 pg (ref 26.0–34.0)
MCHC: 31.9 g/dL (ref 30.0–36.0)
MCV: 93.3 fL (ref 80.0–100.0)
Platelets: 171 10*3/uL (ref 150–400)
RBC: 2.99 MIL/uL — ABNORMAL LOW (ref 3.87–5.11)
RDW: 12.8 % (ref 11.5–15.5)
WBC: 4.1 10*3/uL (ref 4.0–10.5)
nRBC: 0 % (ref 0.0–0.2)

## 2018-03-02 LAB — GLUCOSE, CAPILLARY
Glucose-Capillary: 113 mg/dL — ABNORMAL HIGH (ref 70–99)
Glucose-Capillary: 132 mg/dL — ABNORMAL HIGH (ref 70–99)
Glucose-Capillary: 144 mg/dL — ABNORMAL HIGH (ref 70–99)
Glucose-Capillary: 147 mg/dL — ABNORMAL HIGH (ref 70–99)

## 2018-03-02 MED ORDER — ALPRAZOLAM 0.25 MG PO TABS
0.2500 mg | ORAL_TABLET | Freq: Once | ORAL | Status: AC
  Administered 2018-03-02: 0.25 mg via ORAL
  Filled 2018-03-02: qty 1

## 2018-03-02 MED ORDER — FUROSEMIDE 10 MG/ML IJ SOLN
40.0000 mg | Freq: Two times a day (BID) | INTRAMUSCULAR | Status: DC
  Administered 2018-03-02 – 2018-03-03 (×2): 40 mg via INTRAVENOUS
  Filled 2018-03-02 (×2): qty 4

## 2018-03-02 MED ORDER — DIVALPROEX SODIUM 250 MG PO DR TAB
500.0000 mg | DELAYED_RELEASE_TABLET | Freq: Every day | ORAL | Status: DC
  Administered 2018-03-02 – 2018-03-06 (×5): 500 mg via ORAL
  Filled 2018-03-02 (×5): qty 2

## 2018-03-02 MED ORDER — LOSARTAN POTASSIUM 25 MG PO TABS
25.0000 mg | ORAL_TABLET | Freq: Every day | ORAL | Status: DC
  Administered 2018-03-02 – 2018-03-04 (×3): 25 mg via ORAL
  Filled 2018-03-02 (×3): qty 1

## 2018-03-02 NOTE — Progress Notes (Addendum)
Patient refused Depakote tonight.  Patient states that she "never takes four Depakote pills at night but usually one or two".   She thinks this may be one of the reasons she has been vomiting.  [facility] on call paged to make aware.

## 2018-03-02 NOTE — Progress Notes (Signed)
PROGRESS NOTE    Adryanna Friedt  LDJ:[id number] DOB: [birth year] DOA: 02/23/2018 PCP: Medicine, [facility]  Brief Narrative: 68 year old female with history of chronic pain, insulin-dependent diabetes, anxiety/depression has been admitted 4 times since December with chest pain and shortness of breath.  Originally admitted in early December, found to have severe ischemic cardiomyopathy with EF of 30 to 35%, underwent diagnostic left heart catheterization in December which revealed severe three-vessel disease, initially CABG was considered but PCI felt to be best therapy, underwent stenting to proximal and distal LAD on 12/30/2017, has residual 90% diagonal and 80% OM1 stenosis . -Discharged home on aspirin and Brilinta quickly readmitted with chest pain on 12/15 and discharged the following day after she was ruled out for MI. -After this she was admitted from 12/28 to 1/2 with chest pain and shortness of breath, she was seen by cardiology, her Kary Kos was changed to Effient since it was suspected to be contributing to her dyspnea in addition she also had acute kidney injury and hence Lasix and Aldactone were held, Delene Loll was held , subsequently restarted on Entresto at discharge with a creatinine of 1.3 on 1/2. -Subsequently readmitted on 2/3 with worsening shortness of breath, chest pain chest x-ray concerning for pulmonary edema, creatinine around 1.7-1.9 -Diuresed with IV Lasix, creatinine worsened to 2.5, diuretics decreased -Also given 1 unit of PRBC for worsening anemia, creatinine subsequently improving -2/9 with 2-3episodes of vomiting, per patient report ongoing for a few weeks off and on at home, CT benign, likely med induced, depakote decreased, CT w/o contrast was benign, small effusions, Creatinine up to 2.2-Nephrology input requested  Assessment & Plan:   Acute on chronic systolic CHF -Last echo with EF of 30 to 35% -Initially improved with diuresis however had  significant worsening of creatinine to up to 2.5 range, hence diuretics were held, Delene Loll was discontinued on admission due to worsening renal failure -Also received 1 unit of PRBC and 40 mg of IV Lasix -Subsequently was felt to be euvolemic and restarted on diuretics -O2 weaned down  -Unfortunately clinically appears close to euvolemic now, but CT chest done yesterday notes pleural effusions and mild CHF, will ask nephrology to evaluate due to worsening AKI and mild volume overload -Also has hypoalbuminemia which also makes volume management challenging  History of multivessel CAD -History of proximal and distal LAD stent on 12/10, -Has residual diagonal and OM1 stenosis, not felt to be appropriate for CABG based on CVTS assessment in December -Continue Plavix, Coreg, statin, allergy list indicates that she is allergic to aspirin -I also started her on Imdur for persistent chest pain, which has improved her symptoms to some extent  Acute kidney injury -Baseline creatinine was 1.0, approximately 2 months ago, then worsened likely from cardiorenal syndrome worsened with recent Entresto, diuretics -Creatinine had improved last admission from 2 range to 1.3 at discharge by holding her Entresto, diuretics, at discharge on 1/2 was restarted on Entresto, diuretics were discontinued -On admission creatinine was around 1.7, started diuretics, worsened to 1.9 and then 2.5 -held IV Lasix then, was restarted on oral diuretics creatinine was trending down to the 1.8 range yesterday, now up to 2.2, had couple of episodes of vomiting yesterday which probably contributed however CT done yesterday does indicate some signs of fluid overload, pleural effusions -Nephrology input requested due to wide fluctuations in kidney function, cardiorenal syndrome -Will discontinue Entresto discharge  Anemia due to chronic disease and iron deficiency -Likely multifactorial, volume overloaded state could be  contributing to  some degree of hemodilution -In addition being on antiplatelets increases risk of bleeding, patient denies any overt blood loss -Anemia panel with iron deficiency, given 1 unit of PRBC and IV iron  -History of colonoscopy 5 years ago which was normal per patient report, no history of melena or hematochezia -Will need oral iron at discharge  Intermittent vomiting -Witnessed one episode yesterday 2/8 and subsequently had 2 more yesterday -Patient reports intermittent for 2 weeks, I suspect some of her medications could be playing a role in this, decreased her chronic dose of long-acting morphine from 30 to 15 mg  -Also cut down her Depakote to 500 mg at bedtime  -LFTs are benign, CT abdomen pelvis was unremarkable -Appears to have improved, supportive care  Chronic pain -On high-dose morphine and Lyrica -Renally dose Lyrica -deccreased morphine dose as above  Mild cognitive dysfunction -Higher functions limited likely secondary to high-dose narcotics and Lyrica  Diabetes mellitus -CBGs stable without Lantus which she takes at home, continue sliding scale  DVT prophylaxis: Heparin subcutaneous Code Status: Full code Family Communication: No family at bedside Disposition Plan: Home when creatinine more stable  Consultants:     Procedures:   Antimicrobials:    Subjective: -Is better, breathing is improving -No further vomiting since last evening, tolerated dinner and breakfast this morning  Objective: Vitals:   03/01/18 1151 03/01/18 2028 03/02/18 0613 03/02/18 0616  BP: (!) 172/74 121/77  117/89  Pulse: 90 86  88  Resp: 18 18  18   Temp: 98.9 F (37.2 C) 98 F (36.7 C)  98.4 F (36.9 C)  TempSrc: Oral Oral  Oral  SpO2: 96% 98%  96%  Weight:   71.2 kg   Height:        Intake/Output Summary (Last 24 hours) at 03/02/2018 1310 Last data filed at 03/02/2018 0900 Gross per 24 hour  Intake 840 ml  Output 400 ml  Net 440 ml   Filed Weights   02/28/18 0428 03/01/18  0654 03/02/18 0613  Weight: 73 kg 71.9 kg 71.2 kg    Examination:  Gen: Awake, Alert, Oriented X 3, no distress HEENT: PERRLA, Neck supple, no JVD Lungs: Good air movement, decreased breath sounds at both bases CVS: RRR,No Gallops,Rubs or new Murmurs Abd: soft, Non tender, non distended, BS present Extremities: Trace edema Skin: no new rashes  Data Reviewed:   CBC: Recent Labs  Lab 02/26/18 0457 02/26/18 2025 02/27/18 0448 02/28/18 0541 03/01/18 0434 03/02/18 0504  WBC 5.6  --  4.9 4.8 4.1 4.1  HGB 7.6* 9.0* 8.9* 8.7* 8.7* 8.9*  HCT 23.7* 27.7* 27.7* 27.7* 27.3* 27.9*  MCV 91.5  --  91.4 92.6 92.2 93.3  PLT 181  --  174 181 169 767   Basic Metabolic Panel: Recent Labs  Lab 02/23/18 1449 02/23/18 2020  02/26/18 0457 02/27/18 0448 02/28/18 0541 03/01/18 0434 03/02/18 0504  NA  --  139   < > 140 140 141 141 142  K  --  5.3*   < > 5.6* 5.0 5.1 4.6 4.7  CL  --  105   < > 101 101 106 102 107  CO2  --  25   < > 27 26 29 28 25   GLUCOSE  --  181*   < > 162* 117* 101* 112* 119*  BUN  --  51*   < > 54* 54* 56* 56* 59*  CREATININE 1.73* 1.68*   < > 2.30* 2.15* 2.01* 1.87* 2.27*  CALCIUM  --  8.4*   < > 8.7* 8.8* 8.5* 8.5* 8.4*  MG 1.7  --   --   --   --   --   --   --   PHOS 4.7* 4.4  --   --   --   --   --   --    < > = values in this interval not displayed.   GFR: Estimated Creatinine Clearance: 23.6 mL/min (A) (by C-G formula based on SCr of 2.27 mg/dL (H)). Liver Function Tests: Recent Labs  Lab 02/23/18 2020 03/01/18 0434 03/02/18 0504  AST  --  30 38  ALT  --  15 20  ALKPHOS  --  34* 35*  BILITOT  --  0.8 0.8  PROT  --  6.0* 6.3*  ALBUMIN 2.4* 2.4* 2.6*   No results for input(s): LIPASE, AMYLASE in the last 168 hours. No results for input(s): AMMONIA in the last 168 hours. Coagulation Profile: No results for input(s): INR, PROTIME in the last 168 hours. Cardiac Enzymes: Recent Labs  Lab 02/23/18 1449 02/23/18 2020 02/24/18 0020  TROPONINI <0.03  <0.03 <0.03   BNP (last 3 results) No results for input(s): PROBNP in the last 8760 hours. HbA1C: No results for input(s): HGBA1C in the last 72 hours. CBG: Recent Labs  Lab 03/01/18 1147 03/01/18 1705 03/01/18 2141 03/02/18 0746 03/02/18 1112  GLUCAP 126* 108* 138* 113* 132*   Lipid Profile: No results for input(s): CHOL, HDL, LDLCALC, TRIG, CHOLHDL, LDLDIRECT in the last 72 hours. Thyroid Function Tests: No results for input(s): TSH, T4TOTAL, FREET4, T3FREE, THYROIDAB in the last 72 hours. Anemia Panel: No results for input(s): VITAMINB12, FOLATE, FERRITIN, TIBC, IRON, RETICCTPCT in the last 72 hours. Urine analysis:    Component Value Date/Time   COLORURINE YELLOW 02/25/2018 2316   APPEARANCEUR HAZY (A) 02/25/2018 2316   LABSPEC 1.014 02/25/2018 2316   PHURINE 5.0 02/25/2018 2316   GLUCOSEU NEGATIVE 02/25/2018 2316   HGBUR SMALL (A) 02/25/2018 2316   BILIRUBINUR NEGATIVE 02/25/2018 2316   KETONESUR NEGATIVE 02/25/2018 2316   PROTEINUR >=300 (A) 02/25/2018 2316   UROBILINOGEN 1.0 12/02/2013 1612   NITRITE NEGATIVE 02/25/2018 2316   LEUKOCYTESUR SMALL (A) 02/25/2018 2316   Sepsis Labs: @LABRCNTIP (procalcitonin:4,lacticidven:4)  )No results found for this or any previous visit (from the past 240 hour(s)).       Radiology Studies: Ct Abdomen Pelvis Wo Contrast  Result Date: 03/01/2018 CLINICAL DATA:  68 year old female with history of intermittent vomiting. EXAM: CT ABDOMEN AND PELVIS WITHOUT CONTRAST TECHNIQUE: Multidetector CT imaging of the abdomen and pelvis was performed following the standard protocol without IV contrast. COMPARISON:  CT the abdomen and pelvis 06/01/2017. FINDINGS: Lower chest: Bilateral breast implants are incidentally noted. Atherosclerotic calcifications in the descending thoracic aorta as well as the left anterior descending and right coronary arteries. Small bilateral pleural effusions lying dependently. This is associated with some passive  atelectasis in the lower lobes of the lungs bilaterally. Septal thickening throughout the visualize lung bases concerning for a background of mild interstitial pulmonary edema. Hepatobiliary: No definite suspicious cystic or solid hepatic lesions are confidently identified on today's noncontrast CT examination. Status post cholecystectomy. Pancreas: No definite pancreatic mass or peripancreatic fluid or inflammatory changes are noted on today's noncontrast CT examination. Spleen: Unremarkable. Adrenals/Urinary Tract: Left-sided pelvic kidney. No calculi identified within the collecting system of either kidney, along the course of either ureter or within the lumen of the urinary bladder. No hydroureteronephrosis. Urinary  bladder is normal in appearance. Stomach/Bowel: Unenhanced appearance of the stomach is normal. No pathologic dilatation of small bowel or colon. The appendix is not confidently identified and may be surgically absent. Regardless, there are no inflammatory changes noted adjacent to the cecum to suggest the presence of an acute appendicitis at this time. Vascular/Lymphatic: Aortic atherosclerosis. No lymphadenopathy noted in the abdomen or pelvis. Reproductive: Status post hysterectomy. Ovaries are not confidently identified may be surgically absent or atrophic. Other: No significant volume of ascites.  No pneumoperitoneum. Musculoskeletal: There are no aggressive appearing lytic or blastic lesions noted in the visualized portions of the skeleton. IMPRESSION: 1. No acute findings are noted in the abdomen or pelvis to account for the patient's symptoms. 2. The appearance of the lung bases suggests interstitial pulmonary edema with small bilateral pleural effusions. 3. Aortic atherosclerosis, in addition to least 2 vessel coronary artery disease. Please note that although the presence of coronary artery calcium documents the presence of coronary artery disease, the severity of this disease and any  potential stenosis cannot be assessed on this non-gated CT examination. Assessment for potential risk factor modification, dietary therapy or pharmacologic therapy may be warranted, if clinically indicated. 4. Additional incidental findings, as above. Electronically Signed   By: Vinnie Langton M.D.   On: 03/01/2018 16:24        Scheduled Meds: . atorvastatin  80 mg Oral QHS  . carvedilol  6.25 mg Oral BID WC  . clopidogrel  75 mg Oral Daily  . divalproex  500 mg Oral QHS  . heparin  5,000 Units Subcutaneous Q8H  . insulin aspart  0-5 Units Subcutaneous QHS  . insulin aspart  0-9 Units Subcutaneous TID WC  . isosorbide mononitrate  15 mg Oral Daily  . levothyroxine  200 mcg Oral QAC breakfast  . morphine  15 mg Oral BID  . polyethylene glycol  17 g Oral Daily  . pregabalin  75 mg Oral Daily  . Vitamin D (Ergocalciferol)  50,000 Units Oral Q7 days   Continuous Infusions: . sodium chloride 150 mL/hr at 02/26/18 1616     LOS: 6 days    Time spent: 56min    Domenic Polite, MD [facility]  03/02/2018, 1:10 PM

## 2018-03-02 NOTE — Consult Note (Addendum)
Andrea [name]  HISTORY AND PHYSICAL  Andrea [name] is an 68 y.o. female with PMH  of chronic pain, insulin-dependent diabetes, anxiety/depression.    Chief Complaint: SOB, leg swelling HPI: Patient has been admitted 4 times since December with chest pain and SOB. Originally admitted in early December, found to have severe ischemic cardiomyopathy with EF of 30-35%, underwent diagnostic left heart catheterization in December which revealed severe three-vessel disease, initially CABG was considered but PCI felt to be best therapy, underwent stenting to proximal and distal LAD on 12/30/2017, has residual 90% diagonal and 80% OM1 stenosis. Patient with waxing and waning kidney function that could be secondary to medication, dehydration or T2DM, but consulted for further work up.   PMH: Past Medical History:  Diagnosis Date  . Breast cancer ([place])    bilat mastectomy and LUE lymphadenectomy 2014, chemoRx 2014 - 15  . Diabetes mellitus without complication ([place])   . Esophageal reflux   . Hypertension   . Hypothyroid   . Migraine   . Seizure ([place])    PSH: Past Surgical History:  Procedure Laterality Date  . ABDOMINAL HYSTERECTOMY     1984 , done for ruptured cyst and endometriosis  . APPENDECTOMY    . CHOLECYSTECTOMY     1980's  . CORONARY STENT INTERVENTION N/A 12/30/2017   Procedure: CORONARY STENT INTERVENTION;  Surgeon: Martinique, Peter M, MD;  Location: [facility];  Service: Cardiovascular;  Laterality: N/A;  . EYE SURGERY  08/28/2015   Right eye  . KNEE SURGERY Left   . MASTECTOMY     bilateral  . Open surgery for bowel obstruction, 2000's    . RIGHT/LEFT HEART CATH AND CORONARY ANGIOGRAPHY N/A 12/29/2017   Procedure: RIGHT/LEFT HEART CATH AND CORONARY ANGIOGRAPHY;  Surgeon: Martinique, Peter M, MD;  Location: [facility];  Service: Cardiovascular;  Laterality: N/A;     Past Medical History:  Diagnosis Date  . Breast cancer ([place])    bilat  mastectomy and LUE lymphadenectomy 2014, chemoRx 2014 - 15  . Diabetes mellitus without complication ([place])   . Esophageal reflux   . Hypertension   . Hypothyroid   . Migraine   . Seizure ([place])     Medications:  I have reviewed the patient's current medications.  Medications Prior to Admission  Medication Sig Dispense Refill  . atorvastatin (LIPITOR) 80 MG tablet Take 1 tablet (80 mg total) by mouth at bedtime. 30 tablet 3  . carvedilol (COREG) 6.25 MG tablet Take 1 tablet (6.25 mg total) by mouth 2 (two) times daily with a meal. 60 tablet 3  . clopidogrel (PLAVIX) 75 MG tablet Take 1 tablet (75 mg total) by mouth daily. 30 tablet 3  . divalproex (DEPAKOTE) 250 MG DR tablet Take 1,000 mg by mouth at bedtime.     [name] ELDERBERRY PO Take 1 Dose by mouth daily.    . furosemide (LASIX) 20 MG tablet Take 1 tablet (20 mg total) by mouth daily. 30 tablet 3  . LANTUS SOLOSTAR 100 UNIT/ML Solostar Pen Inject 20-50 Units into the skin daily as needed (sugar levels). Take based on blood sugar  1  . levothyroxine (SYNTHROID, LEVOTHROID) 200 MCG tablet Take 1 tablet (200 mcg total) by mouth at bedtime. (Patient taking differently: Take 200 mcg by mouth daily before breakfast. ) 30 tablet 0  . morphine (KADIAN) 30 MG 24 hr capsule Take 30 mg by mouth 2 (two) times daily.    [name] oxyCODONE-acetaminophen (PERCOCET) 10-325 MG  tablet Take 1 tablet by mouth 3 (three) times daily as needed for pain.  0  . pregabalin (LYRICA) 75 MG capsule Take 1 capsule (75 mg total) 2 (two) times daily by mouth. 60 capsule 0  . promethazine (PHENERGAN) 25 MG tablet Take 25 mg by mouth every 6 (six) hours as needed for nausea.   1  . sacubitril-valsartan (ENTRESTO) 24-26 MG Take 1 tablet by mouth 2 (two) times daily. 60 tablet 3  . senna-docusate (SENOKOT-S) 8.6-50 MG tablet Take 2 tablets by mouth 2 (two) times daily. (Patient taking differently: Take 2 tablets by mouth daily as needed for mild constipation. )    . Vitamin D,  Ergocalciferol, (DRISDOL) 1.25 MG (50000 UT) CAPS capsule Take 50,000 Units by mouth every 7 (seven) days.      ALLERGIES:   Allergies  Allergen Reactions  . Ticagrelor Rash  . Aspartame And Phenylalanine Hives and Diarrhea  . Aspirin Nausea And Vomiting  . Maxzide [Hydrochlorothiazide W-Triamterene] Swelling    Fluid retention  . Metformin And Related Diarrhea  . Nsaids Nausea And Vomiting  . Other Other (See Comments)    All steroids produce psychosis per pt Artificial sweeteners produce nausea and upset stomach.  Gregary Cromer [Pravastatin] Other (See Comments)    unknown  . Spironolactone   . Stadol [Butorphanol] Other (See Comments)    Toradol, etc And related- hallucinations  . Toradol [Ketorolac Tromethamine] Other (See Comments)    hallucinations  . Vistaril [Hydroxyzine Hcl] Other (See Comments)    unknown  . Erythromycin Itching and Rash  . Morphine And Related Hives and Rash    Iv morphine.  . Penicillins Hives, Itching and Rash    Has patient had a PCN reaction causing immediate rash, facial/tongue/throat swelling, SOB or lightheadedness with hypotension: yes Has patient had a PCN reaction causing severe rash involving mucus membranes or skin necrosis: unknown Has patient had a PCN reaction that required hospitalization : unknown Has patient had a PCN reaction occurring within the last 10 years: no If all of the above answers are "NO", then may proceed with Cephalosporin use.     FAM HX: Family History  Problem Relation Age of Onset  . Sudden Cardiac Death Neg Hx     Social History:   reports that she has never smoked. She has never used smokeless tobacco. She reports that she does not drink alcohol or use drugs.  ROS: Review of Systems  Constitutional: Negative for chills and fever.  HENT: Positive for sore throat.   Respiratory: Positive for shortness of breath. Negative for cough.   Cardiovascular: Positive for orthopnea and leg swelling. Negative for  chest pain.  Gastrointestinal: Positive for nausea and vomiting. Negative for diarrhea.  All other systems reviewed and are negative.   Blood pressure (!) 150/92, pulse 84, temperature 98.6 F (37 C), temperature source Oral, resp. rate 18, height 5\' 4"  (1.626 m), weight 71.2 kg, SpO2 90 %. PHYSICAL EXAM: Physical Exam  Constitutional: She is oriented to person, place, and time. She appears well-developed and well-nourished.  HENT:  Head: Normocephalic and atraumatic.  Eyes: Pupils are equal, round, and reactive to light. Conjunctivae are normal.  Neck: Normal range of motion. Neck supple.  Cardiovascular: Normal rate and regular rhythm.  Respiratory: Effort normal.  Crackles at bilateral bases  GI: Soft. She exhibits no distension.  Musculoskeletal: Normal range of motion.        General: No edema.  Neurological: She is alert and oriented to  person, place, and time.  Skin: Skin is warm. She is not diaphoretic.  Psychiatric: She has a normal mood and affect. Her behavior is normal. Judgment and thought content normal.     Results for orders placed or performed during the hospital encounter of 02/23/18 (from the past 48 hour(s))  Glucose, capillary     Status: Abnormal   Collection Time: 02/28/18  4:43 PM  Result Value Ref Range   Glucose-Capillary 145 (H) 70 - 99 mg/dL  Glucose, capillary     Status: Abnormal   Collection Time: 02/28/18  9:55 PM  Result Value Ref Range   Glucose-Capillary 124 (H) 70 - 99 mg/dL  CBC     Status: Abnormal   Collection Time: 03/01/18  4:34 AM  Result Value Ref Range   WBC 4.1 4.0 - 10.5 K/uL   RBC 2.96 (L) 3.87 - 5.11 MIL/uL   Hemoglobin 8.7 (L) 12.0 - 15.0 g/dL   HCT 27.3 (L) 36.0 - 46.0 %   MCV 92.2 80.0 - 100.0 fL   MCH 29.4 26.0 - 34.0 pg   MCHC 31.9 30.0 - 36.0 g/dL   RDW 12.8 11.5 - 15.5 %   Platelets 169 150 - 400 K/uL    Comment: REPEATED TO VERIFY   nRBC 0.0 0.0 - 0.2 %    Comment: Performed at [facility], [place] [address], [place], [place] [address]  Basic metabolic panel     Status: Abnormal   Collection Time: 03/01/18  4:34 AM  Result Value Ref Range   Sodium 141 135 - 145 mmol/L   Potassium 4.6 3.5 - 5.1 mmol/L   Chloride 102 98 - 111 mmol/L   CO2 28 22 - 32 mmol/L   Glucose, Bld 112 (H) 70 - 99 mg/dL   BUN 56 (H) 8 - 23 mg/dL   Creatinine, Ser 1.87 (H) 0.44 - 1.00 mg/dL   Calcium 8.5 (L) 8.9 - 10.3 mg/dL   GFR calc non Af Amer 28 (L) >60 mL/min   GFR calc Af Amer 32 (L) >60 mL/min   Anion gap 11 5 - 15    Comment: Performed at [facility] [address], [place], [place] [address]  Hepatic function panel     Status: Abnormal   Collection Time: 03/01/18  4:34 AM  Result Value Ref Range   Total Protein 6.0 (L) 6.5 - 8.1 g/dL   Albumin 2.4 (L) 3.5 - 5.0 g/dL   AST 30 15 - 41 U/L   ALT 15 0 - 44 U/L   Alkaline Phosphatase 34 (L) 38 - 126 U/L   Total Bilirubin 0.8 0.3 - 1.2 mg/dL   Bilirubin, Direct <0.1 0.0 - 0.2 mg/dL   Indirect Bilirubin NOT CALCULATED 0.3 - 0.9 mg/dL    Comment: Performed at [facility] [address] [place],  [address]  Glucose, capillary     Status: Abnormal   Collection Time: 03/01/18  7:57 AM  Result Value Ref Range   Glucose-Capillary 101 (H) 70 - 99 mg/dL   Comment 1 Notify RN    Comment 2 Document in Chart   Glucose, capillary     Status: Abnormal   Collection Time: 03/01/18 11:47 AM  Result Value Ref Range   Glucose-Capillary 126 (H) 70 - 99 mg/dL  Glucose, capillary     Status: Abnormal   Collection Time: 03/01/18  5:05 PM  Result Value Ref Range   Glucose-Capillary 108 (H) 70 -  99 mg/dL   Comment 1 Notify RN    Comment 2 Document in Chart   Glucose, capillary     Status: Abnormal   Collection Time: 03/01/18  9:41 PM  Result Value Ref Range   Glucose-Capillary 138 (H) 70 - 99 mg/dL  CBC     Status: Abnormal   Collection Time: 03/02/18  5:04 AM  Result Value Ref Range   WBC 4.1 4.0 - 10.5 K/uL   RBC 2.99 (L) 3.87 - 5.11 MIL/uL    Hemoglobin 8.9 (L) 12.0 - 15.0 g/dL   HCT 27.9 (L) 36.0 - 46.0 %   MCV 93.3 80.0 - 100.0 fL   MCH 29.8 26.0 - 34.0 pg   MCHC 31.9 30.0 - 36.0 g/dL   RDW 12.8 11.5 - 15.5 %   Platelets 171 150 - 400 K/uL   nRBC 0.0 0.0 - 0.2 %    Comment: Performed at [facility], [place] [address], [place], [place] [address]  Comprehensive metabolic panel     Status: Abnormal   Collection Time: 03/02/18  5:04 AM  Result Value Ref Range   Sodium 142 135 - 145 mmol/L   Potassium 4.7 3.5 - 5.1 mmol/L   Chloride 107 98 - 111 mmol/L   CO2 25 22 - 32 mmol/L   Glucose, Bld 119 (H) 70 - 99 mg/dL   BUN 59 (H) 8 - 23 mg/dL   Creatinine, Ser 2.27 (H) 0.44 - 1.00 mg/dL   Calcium 8.4 (L) 8.9 - 10.3 mg/dL   Total Protein 6.3 (L) 6.5 - 8.1 g/dL   Albumin 2.6 (L) 3.5 - 5.0 g/dL   AST 38 15 - 41 U/L   ALT 20 0 - 44 U/L   Alkaline Phosphatase 35 (L) 38 - 126 U/L   Total Bilirubin 0.8 0.3 - 1.2 mg/dL   GFR calc non Af Amer 22 (L) >60 mL/min   GFR calc Af Amer 25 (L) >60 mL/min   Anion gap 10 5 - 15    Comment: Performed at [facility], [place] [address], [place], [place] [address]  Glucose, capillary     Status: Abnormal   Collection Time: 03/02/18  7:46 AM  Result Value Ref Range   Glucose-Capillary 113 (H) 70 - 99 mg/dL  Glucose, capillary     Status: Abnormal   Collection Time: 03/02/18 11:12 AM  Result Value Ref Range   Glucose-Capillary 132 (H) 70 - 99 mg/dL    Ct Abdomen Pelvis Wo Contrast  Result Date: 03/01/2018 CLINICAL DATA:  68 year old female with history of intermittent vomiting. EXAM: CT ABDOMEN AND PELVIS WITHOUT CONTRAST TECHNIQUE: Multidetector CT imaging of the abdomen and pelvis was performed following the standard protocol without IV contrast. COMPARISON:  CT the abdomen and pelvis 06/01/2017. FINDINGS: Lower chest: Bilateral breast implants are incidentally noted. Atherosclerotic calcifications in the descending thoracic aorta as well as the left anterior descending and right  coronary arteries. Small bilateral pleural effusions lying dependently. This is associated with some passive atelectasis in the lower lobes of the lungs bilaterally. Septal thickening throughout the visualize lung bases concerning for a background of mild interstitial pulmonary edema. Hepatobiliary: No definite suspicious cystic or solid hepatic lesions are confidently identified on today's noncontrast CT examination. Status post cholecystectomy. Pancreas: No definite pancreatic mass or peripancreatic fluid or inflammatory changes are noted on today's noncontrast CT examination. Spleen: Unremarkable. Adrenals/Urinary Tract: Left-sided pelvic kidney. No calculi identified within the collecting system of either kidney, along  the course of either ureter or within the lumen of the urinary bladder. No hydroureteronephrosis. Urinary bladder is normal in appearance. Stomach/Bowel: Unenhanced appearance of the stomach is normal. No pathologic dilatation of small bowel or colon. The appendix is not confidently identified and may be surgically absent. Regardless, there are no inflammatory changes noted adjacent to the cecum to suggest the presence of an acute appendicitis at this time. Vascular/Lymphatic: Aortic atherosclerosis. No lymphadenopathy noted in the abdomen or pelvis. Reproductive: Status post hysterectomy. Ovaries are not confidently identified may be surgically absent or atrophic. Other: No significant volume of ascites.  No pneumoperitoneum. Musculoskeletal: There are no aggressive appearing lytic or blastic lesions noted in the visualized portions of the skeleton. IMPRESSION: 1. No acute findings are noted in the abdomen or pelvis to account for the patient's symptoms. 2. The appearance of the lung bases suggests interstitial pulmonary edema with small bilateral pleural effusions. 3. Aortic atherosclerosis, in addition to least 2 vessel coronary artery disease. Please note that although the presence of coronary  artery calcium documents the presence of coronary artery disease, the severity of this disease and any potential stenosis cannot be assessed on this non-gated CT examination. Assessment for potential risk factor modification, dietary therapy or pharmacologic therapy may be warranted, if clinically indicated. 4. Additional incidental findings, as above. Electronically Signed   By: Vinnie Langton M.D.   On: 03/01/2018 16:24    Assessment/Plan 1. AKI: Baseline Cr ~1.01, 2 months ago patient worsened from likely cardiorenal syndrome secondary to cardiomyopathy.  Started on Entresto and diuretics and had worsening of kidney function.  Patient with multiple episodes of vomiting which has likely worsened kidney function in setting of dehydration. Patient with T2DM and could have worsening renal function 2/2 diabetic nephropathy. Given waxing and waning of creatinine, will obtain labs to rule out other etiology including Urine protein/creatinine, SPEP, and serum free light chains to rule out amyloidosis or myeloma. Will start losartan for kidney protection, better blood pressure control and for cardiac benefit.   2. Acute on chronic HFrEF: Last echo with EF of 30 to 35%.  Appears euvolemic on exam however with some crackles noted in bilateral bases on lung exam as well as small pleural effusions noted on recent CT without contrast. Will start patient on Lasix 40 IV BID to try to better diurese given SOB and desats.   Martinique Jazira Maloney, DO PGY-2, [facility]  03/02/2018, 2:41 PM

## 2018-03-02 NOTE — Progress Notes (Signed)
SATURATION QUALIFICATIONS: (This note is used to comply with regulatory documentation for home oxygen)  Patient Saturations on Room Air at Rest = 87%  Patient Saturations on Room Air while Ambulating = 88%  Patient Saturations on 1 Liters of oxygen while Ambulating = 91%  Please briefly explain why patient needs home oxygen:Pt desats at rest when [facility] is at 20 degrees.  Pt desats with activity but not below 88%.  Will continue to follow pt.   Thanks [place] Pager:  [phone]  Office:  [phone]

## 2018-03-02 NOTE — Progress Notes (Signed)
Physical Therapy Treatment Patient Details Name: Andrea [name] MRN: [id number] DOB: Sep 20, 1950 Today's Date: 03/02/2018    History of Present Illness Pt is a 68 y.o. female admitted 02/23/18 with chest pain and SOB; CXR concerning for pulmonary edema. Worked up for CHF. Of note, four recent admissions since 12/2017 with similar symptoms, s/p cardiac cath and PCI. PMH includes chronic pain, DM, anxiety/depression.    PT Comments    Pt admitted with above diagnosis. Pt currently with functional limitations due to balaance and endurance deficits. Pt was able to ambulate in halls with rW with min guard assist to supervision. Did not desat on RA below 90%.  However when pt laid down on return to room, desat to 87% at rest.  She states she she lays down, she feels like she can't swallow.  HOB >20 degrees.  Will continue acute PT.   Pt will benefit from skilled PT to increase their independence and safety with mobility to allow discharge to the venue listed below.   SATURATION QUALIFICATIONS: (This note is used to comply with regulatory documentation for home oxygen)  Patient Saturations on Room Air at Rest = 87%  Patient Saturations on Room Air while Ambulating = 88%  Patient Saturations on 1 Liters of oxygen while Ambulating = 91%  Please briefly explain why patient needs home oxygen:Pt desats at rest when [facility] is at 20 degrees.  Pt desats with activity but not below 88%.   Follow Up Recommendations  Home health PT;Supervision - Intermittent     Equipment Recommendations  Other (comment)(home O2)    Recommendations for Other Services       Precautions / Restrictions Precautions Precautions: Fall Restrictions Weight Bearing Restrictions: No    Mobility  Bed Mobility Overal bed mobility: Modified Independent Bed Mobility: Supine to Sit     Supine to sit: Min guard     General bed mobility comments: incr time but no assist needed.    Transfers   Equipment used: Rolling  walker (2 wheeled) Transfers: Sit to/from Stand Sit to Stand: Supervision            Ambulation/Gait Ambulation/Gait assistance: Supervision;Min guard Gait Distance (Feet): 500 Feet Assistive device: Rolling walker (2 wheeled) Gait Pattern/deviations: Step-through pattern;Decreased stride length;Drifts right/left Gait velocity: Decreased Gait velocity interpretation: 1.31 - 2.62 ft/sec, indicative of limited community ambulator General Gait Details: Slow, mostly steady gait with RW and supervision to min guard for safety only with distractions.  Overall does well with RW but cannot walk without RW>    Stairs             Wheelchair Mobility    Modified Rankin (Stroke Patients Only)       Balance Overall balance assessment: Needs assistance Sitting-balance support: No upper extremity supported;Feet supported Sitting balance-Leahy Scale: Good     Standing balance support: Bilateral upper extremity supported;During functional activity Standing balance-Leahy Scale: Fair Standing balance comment: Can static stand without UE support but needs UE support for dynamic activity                            Cognition Arousal/Alertness: Awake/alert Behavior During Therapy: WFL for tasks assessed/performed Overall Cognitive Status: No family/caregiver present to determine baseline cognitive functioning Area of Impairment: Memory;Attention                   Current Attention Level: Alternating Memory: Decreased short-term memory  Exercises General Exercises - Lower Extremity Ankle Circles/Pumps: AROM;Both;10 reps;Seated Long Arc Quad: AROM;Both;10 reps;Seated Hip Flexion/Marching: AROM;Both;10 reps;Seated    General Comments General comments (skin integrity, edema, etc.): Did not desat below 88% with activity.  However once pt lying down at rest desat to 87%.  Pt states when she lays down (HOB was at 20 degrees) she feels like she  can't swallow.  Needed 1L to keep sats >90% at reat on RA when lying down with HOB 20 degrees.       Pertinent Vitals/Pain Pain Assessment: No/denies pain    Home Living                      Prior Function            PT Goals (current goals can now be found in the care plan section) Acute Rehab PT Goals Patient Stated Goal: to get better Progress towards PT goals: Progressing toward goals    Frequency    Min 3X/week      PT Plan Current plan remains appropriate    Co-evaluation              AM-PAC PT "6 Clicks" Mobility   Outcome Measure  Help needed turning from your back to your side while in a flat bed without using bedrails?: None Help needed moving from lying on your back to sitting on the side of a flat bed without using bedrails?: None Help needed moving to and from a bed to a chair (including a wheelchair)?: None Help needed standing up from a chair using your arms (e.g., wheelchair or bedside chair)?: A Little Help needed to walk in hospital room?: A Little Help needed climbing 3-5 steps with a railing? : A Little 6 Click Score: 21    End of Session Equipment Utilized During Treatment: Gait belt;Oxygen Activity Tolerance: Patient limited by fatigue Patient left: in bed;with call bell/phone within reach Nurse Communication: Mobility status PT Visit Diagnosis: Unsteadiness on feet (R26.81);Muscle weakness (generalized) (M62.81)     Time: 1102-1117 PT Time Calculation (min) (ACUTE ONLY): 15 min  Charges:  $Gait Training: 8-22 mins                     [place] Pager:  [phone]  Office:  [place] 03/02/2018, 12:24 PM

## 2018-03-02 NOTE — Plan of Care (Signed)
  Problem: Activity: Goal: Capacity to carry out activities will improve Outcome: Progressing   Problem: Cardiac: Goal: Ability to achieve and maintain adequate cardiopulmonary perfusion will improve Outcome: Progressing   Problem: Education: Goal: Ability to demonstrate management of disease process will improve Outcome: Progressing Goal: Ability to verbalize understanding of medication therapies will improve Outcome: Progressing   Problem: Activity: Goal: Capacity to carry out activities will improve Outcome: Progressing

## 2018-03-03 LAB — BASIC METABOLIC PANEL
ANION GAP: 10 (ref 5–15)
BUN: 59 mg/dL — ABNORMAL HIGH (ref 8–23)
CHLORIDE: 103 mmol/L (ref 98–111)
CO2: 26 mmol/L (ref 22–32)
Calcium: 8.3 mg/dL — ABNORMAL LOW (ref 8.9–10.3)
Creatinine, Ser: 2.1 mg/dL — ABNORMAL HIGH (ref 0.44–1.00)
GFR calc Af Amer: 28 mL/min — ABNORMAL LOW (ref >60)
GFR calc non Af Amer: 24 mL/min — ABNORMAL LOW (ref >60)
Glucose, Bld: 152 mg/dL — ABNORMAL HIGH (ref 70–99)
Potassium: 4.6 mmol/L (ref 3.5–5.1)
Sodium: 139 mmol/L (ref 135–145)

## 2018-03-03 LAB — GLUCOSE, CAPILLARY
Glucose-Capillary: 123 mg/dL — ABNORMAL HIGH (ref 70–99)
Glucose-Capillary: 123 mg/dL — ABNORMAL HIGH (ref 70–99)
Glucose-Capillary: 147 mg/dL — ABNORMAL HIGH (ref 70–99)
Glucose-Capillary: 136 mg/dL — ABNORMAL HIGH (ref 70–99)

## 2018-03-03 LAB — CBC
HCT: 28 % — ABNORMAL LOW (ref 36.0–46.0)
HEMOGLOBIN: 9.1 g/dL — AB (ref 12.0–15.0)
MCH: 30.3 pg (ref 26.0–34.0)
MCHC: 32.5 g/dL (ref 30.0–36.0)
MCV: 93.3 fL (ref 80.0–100.0)
NRBC: 0 % (ref 0.0–0.2)
Platelets: 148 10*3/uL — ABNORMAL LOW (ref 150–400)
RBC: 3 MIL/uL — AB (ref 3.87–5.11)
RDW: 12.8 % (ref 11.5–15.5)
WBC: 3.5 10*3/uL — ABNORMAL LOW (ref 4.0–10.5)

## 2018-03-03 MED ORDER — ALPRAZOLAM 0.25 MG PO TABS
0.2500 mg | ORAL_TABLET | Freq: Two times a day (BID) | ORAL | Status: DC | PRN
  Administered 2018-03-03 – 2018-03-04 (×3): 0.25 mg via ORAL
  Filled 2018-03-03 (×3): qty 1

## 2018-03-03 MED ORDER — FUROSEMIDE 10 MG/ML IJ SOLN
60.0000 mg | Freq: Two times a day (BID) | INTRAMUSCULAR | Status: DC
  Administered 2018-03-03: 60 mg via INTRAVENOUS
  Filled 2018-03-03: qty 6

## 2018-03-03 NOTE — Progress Notes (Signed)
Patient had one small episode of emesis at 0400 this morning.  Patient given IV Zofran with improved symptoms.

## 2018-03-03 NOTE — Progress Notes (Signed)
PROGRESS NOTE    Andrea [name]  QPY:[id number] DOB: [birth year] DOA: 02/23/2018 PCP: Medicine, [facility]  Brief Narrative: 68 year old female with history of chronic pain, insulin-dependent diabetes, anxiety/depression has been admitted 4 times since December with chest pain and shortness of breath.  Originally admitted in early December, found to have severe ischemic cardiomyopathy with EF of 30 to 35%, underwent diagnostic left heart catheterization in December which revealed severe three-vessel disease, initially CABG was considered but PCI felt to be best therapy, underwent stenting to proximal and distal LAD on 12/30/2017, has residual 90% diagonal and 80% OM1 stenosis . -Discharged home on aspirin and Brilinta quickly readmitted with chest pain on 12/15 and discharged the following day after she was ruled out for MI. -After this she was admitted from 12/28 to 1/2 with chest pain and shortness of breath, she was seen by cardiology, her Kary Kos was changed to Effient since it was suspected to be contributing to her dyspnea in addition she also had acute kidney injury and hence Lasix and Aldactone were held, Delene Loll was held , subsequently restarted on Entresto at discharge with a creatinine of 1.3 on 1/2. -Subsequently readmitted on 2/3 with worsening shortness of breath, chest pain chest x-ray concerning for pulmonary edema, creatinine around 1.7-1.9 -Diuresed with IV Lasix, creatinine worsened to 2.5, diuretics decreased -Also given 1 unit of PRBC for worsening anemia, creatinine subsequently improving -2/9 with 2-3episodes of vomiting, per patient report ongoing for a few weeks off and on at home, CT benign, likely med induced, depakote decreased, CT w/o contrast was benign, small effusions, Creatinine up to 2.2-Nephrology input requested 2/10: IV Lasix restarted, ARB added 2/11: Diuretic dose increased  Assessment & Plan:   Acute on chronic systolic CHF -Last echo  with EF of 30 to 35% -Initially improved with diuresis however had significant worsening of creatinine to up to 2.5 range, hence diuretics were held, Delene Loll was discontinued on admission due to worsening renal failure -Also received 1 unit of PRBC and 40 mg of IV Lasix -Subsequently was felt to be euvolemic and restarted on diuretics -O2 weaned down  -Due to wide fluctuations in creatinine with diuretics nephrology consulted -IV Lasix restarted 2/10, due to suboptimal diuresis Lasix dose increased today  History of multivessel CAD -History of proximal and distal LAD stent on 12/10, -Has residual diagonal and OM1 stenosis, not felt to be appropriate for CABG based on CVTS assessment in December -Continue Plavix, Coreg, statin, allergy list indicates that she is allergic to aspirin -I also started her on Imdur for persistent chest pain, which has improved her symptoms to some extent  Acute kidney injury -Baseline creatinine was 1.0, approximately 2 months ago, then worsened likely from cardiorenal syndrome worsened with recent Entresto, diuretics -Creatinine had improved last admission from 2 range to 1.3 at discharge by holding her Entresto, diuretics, at discharge on 1/2 was restarted on Entresto, diuretics were discontinued -On admission creatinine was around 1.7, started diuretics, worsened to 1.9 and then 2.5 -held IV Lasix then, was restarted on oral diuretics creatinine was trending down to the 1.8 range yesterday, then up to 2.2, -Nephrology input requested due to wide fluctuations in kidney function, cardiorenal syndrome -Will discontinue Entresto discharge -IV Lasix restarted yesterday, dose increased, also started on ARB, creatinine stable today, greatly appreciate renal recommendations  Anemia due to chronic disease and iron deficiency -Likely multifactorial, volume overloaded state could be contributing to some degree of hemodilution -In addition being on antiplatelets increases  risk  of bleeding, patient denies any overt blood loss -Anemia panel with iron deficiency, given 1 unit of PRBC and IV iron  -History of colonoscopy 5 years ago which was normal per patient report, no history of melena or hematochezia -Will need oral iron at discharge  Intermittent vomiting -Mild, appears to have improved  -Likely medication related, decreased dose of Depakote and long-acting morphine  -CT abdomen pelvis was unremarkable, LFTs were benign -Supportive care  Chronic pain -On high-dose morphine and Lyrica -Renally dose Lyrica -deccreased morphine dose as above  Mild cognitive dysfunction -Higher functions limited likely secondary to high-dose narcotics and Lyrica  Diabetes mellitus -CBGs stable without Lantus which she takes at home, continue sliding scale  DVT prophylaxis: Heparin subcutaneous Code Status: Full code Family Communication: No family at bedside Disposition Plan: Home when creatinine more stable, likely 1 to 2 days  Consultants:  Nephrology   Procedures:   Antimicrobials:    Subjective: -Feels a little better, still dyspneic with activity  Objective: Vitals:   03/03/18 0400 03/03/18 0416 03/03/18 0755 03/03/18 1202  BP:  (!) 119/103 117/65 (!) 162/75  Pulse:  81 81 78  Resp:  16 16 16   Temp:  98.2 F (36.8 C) 98.4 F (36.9 C) 97.9 F (36.6 C)  TempSrc:  Oral Oral Oral  SpO2:  91% 94% 98%  Weight: 71.7 kg     Height:        Intake/Output Summary (Last 24 hours) at 03/03/2018 1333 Last data filed at 03/03/2018 0943 Gross per 24 hour  Intake 480 ml  Output 650 ml  Net -170 ml   Filed Weights   03/01/18 0654 03/02/18 0613 03/03/18 0400  Weight: 71.9 kg 71.2 kg 71.7 kg    Examination:  Gen: Awake, Alert, Oriented X 3,  HEENT: PERRLA, Neck supple, no JVD Lungs: Few basilar crackles CVS: RRR,No Gallops,Rubs or new Murmurs Abd: soft, Non tender, non distended, BS present Extremities: Trace edema Skin: no new rashes  Data  Reviewed:   CBC: Recent Labs  Lab 02/27/18 0448 02/28/18 0541 03/01/18 0434 03/02/18 0504 03/03/18 0622  WBC 4.9 4.8 4.1 4.1 3.5*  HGB 8.9* 8.7* 8.7* 8.9* 9.1*  HCT 27.7* 27.7* 27.3* 27.9* 28.0*  MCV 91.4 92.6 92.2 93.3 93.3  PLT 174 181 169 171 253*   Basic Metabolic Panel: Recent Labs  Lab 02/27/18 0448 02/28/18 0541 03/01/18 0434 03/02/18 0504 03/03/18 0622  NA 140 141 141 142 139  K 5.0 5.1 4.6 4.7 4.6  CL 101 106 102 107 103  CO2 26 29 28 25 26   GLUCOSE 117* 101* 112* 119* 152*  BUN 54* 56* 56* 59* 59*  CREATININE 2.15* 2.01* 1.87* 2.27* 2.10*  CALCIUM 8.8* 8.5* 8.5* 8.4* 8.3*   GFR: Estimated Creatinine Clearance: 25.6 mL/min (A) (by C-G formula based on SCr of 2.1 mg/dL (H)). Liver Function Tests: Recent Labs  Lab 03/01/18 0434 03/02/18 0504  AST 30 38  ALT 15 20  ALKPHOS 34* 35*  BILITOT 0.8 0.8  PROT 6.0* 6.3*  ALBUMIN 2.4* 2.6*   No results for input(s): LIPASE, AMYLASE in the last 168 hours. No results for input(s): AMMONIA in the last 168 hours. Coagulation Profile: No results for input(s): INR, PROTIME in the last 168 hours. Cardiac Enzymes: No results for input(s): CKTOTAL, CKMB, CKMBINDEX, TROPONINI in the last 168 hours. BNP (last 3 results) No results for input(s): PROBNP in the last 8760 hours. HbA1C: No results for input(s): HGBA1C in the last 72 hours.  CBG: Recent Labs  Lab 03/02/18 1112 03/02/18 1625 03/02/18 2121 03/03/18 0742 03/03/18 1230  GLUCAP 132* 144* 147* 123* 123*   Lipid Profile: No results for input(s): CHOL, HDL, LDLCALC, TRIG, CHOLHDL, LDLDIRECT in the last 72 hours. Thyroid Function Tests: No results for input(s): TSH, T4TOTAL, FREET4, T3FREE, THYROIDAB in the last 72 hours. Anemia Panel: No results for input(s): VITAMINB12, FOLATE, FERRITIN, TIBC, IRON, RETICCTPCT in the last 72 hours. Urine analysis:    Component Value Date/Time   COLORURINE YELLOW 02/25/2018 2316   APPEARANCEUR HAZY (A) 02/25/2018 2316    LABSPEC 1.014 02/25/2018 2316   PHURINE 5.0 02/25/2018 2316   GLUCOSEU NEGATIVE 02/25/2018 2316   HGBUR SMALL (A) 02/25/2018 2316   BILIRUBINUR NEGATIVE 02/25/2018 2316   KETONESUR NEGATIVE 02/25/2018 2316   PROTEINUR >=300 (A) 02/25/2018 2316   UROBILINOGEN 1.0 12/02/2013 1612   NITRITE NEGATIVE 02/25/2018 2316   LEUKOCYTESUR SMALL (A) 02/25/2018 2316   Sepsis Labs: @LABRCNTIP (procalcitonin:4,lacticidven:4)  )No results found for this or any previous visit (from the past 240 hour(s)).       Radiology Studies: Ct Abdomen Pelvis Wo Contrast  Result Date: 03/01/2018 CLINICAL DATA:  68 year old female with history of intermittent vomiting. EXAM: CT ABDOMEN AND PELVIS WITHOUT CONTRAST TECHNIQUE: Multidetector CT imaging of the abdomen and pelvis was performed following the standard protocol without IV contrast. COMPARISON:  CT the abdomen and pelvis 06/01/2017. FINDINGS: Lower chest: Bilateral breast implants are incidentally noted. Atherosclerotic calcifications in the descending thoracic aorta as well as the left anterior descending and right coronary arteries. Small bilateral pleural effusions lying dependently. This is associated with some passive atelectasis in the lower lobes of the lungs bilaterally. Septal thickening throughout the visualize lung bases concerning for a background of mild interstitial pulmonary edema. Hepatobiliary: No definite suspicious cystic or solid hepatic lesions are confidently identified on today's noncontrast CT examination. Status post cholecystectomy. Pancreas: No definite pancreatic mass or peripancreatic fluid or inflammatory changes are noted on today's noncontrast CT examination. Spleen: Unremarkable. Adrenals/Urinary Tract: Left-sided pelvic kidney. No calculi identified within the collecting system of either kidney, along the course of either ureter or within the lumen of the urinary bladder. No hydroureteronephrosis. Urinary bladder is normal in  appearance. Stomach/Bowel: Unenhanced appearance of the stomach is normal. No pathologic dilatation of small bowel or colon. The appendix is not confidently identified and may be surgically absent. Regardless, there are no inflammatory changes noted adjacent to the cecum to suggest the presence of an acute appendicitis at this time. Vascular/Lymphatic: Aortic atherosclerosis. No lymphadenopathy noted in the abdomen or pelvis. Reproductive: Status post hysterectomy. Ovaries are not confidently identified may be surgically absent or atrophic. Other: No significant volume of ascites.  No pneumoperitoneum. Musculoskeletal: There are no aggressive appearing lytic or blastic lesions noted in the visualized portions of the skeleton. IMPRESSION: 1. No acute findings are noted in the abdomen or pelvis to account for the patient's symptoms. 2. The appearance of the lung bases suggests interstitial pulmonary edema with small bilateral pleural effusions. 3. Aortic atherosclerosis, in addition to least 2 vessel coronary artery disease. Please note that although the presence of coronary artery calcium documents the presence of coronary artery disease, the severity of this disease and any potential stenosis cannot be assessed on this non-gated CT examination. Assessment for potential risk factor modification, dietary therapy or pharmacologic therapy may be warranted, if clinically indicated. 4. Additional incidental findings, as above. Electronically Signed   By: Vinnie Langton M.D.   On: 03/01/2018  16:24        Scheduled Meds: . atorvastatin  80 mg Oral QHS  . carvedilol  6.25 mg Oral BID WC  . clopidogrel  75 mg Oral Daily  . divalproex  500 mg Oral QHS  . furosemide  60 mg Intravenous BID  . heparin  5,000 Units Subcutaneous Q8H  . insulin aspart  0-5 Units Subcutaneous QHS  . insulin aspart  0-9 Units Subcutaneous TID WC  . isosorbide mononitrate  15 mg Oral Daily  . levothyroxine  200 mcg Oral QAC breakfast   . losartan  25 mg Oral Daily  . morphine  15 mg Oral BID  . polyethylene glycol  17 g Oral Daily  . pregabalin  75 mg Oral Daily  . Vitamin D (Ergocalciferol)  50,000 Units Oral Q7 days   Continuous Infusions: . sodium chloride 150 mL/hr at 02/26/18 1616     LOS: 7 days    Time spent: 36min    Domenic Polite, MD [facility]  03/03/2018, 1:33 PM

## 2018-03-03 NOTE — Care Management Important Message (Signed)
Important Message  Patient Details  Name: Andrea [name] MRN: [id number] Date of Birth: December 20, 1950   Medicare Important Message Given:  Yes    [name] Morrissa Shein 03/03/2018, 4:35 PM

## 2018-03-03 NOTE — Progress Notes (Signed)
Andrea [name] Progress Note    Assessment/ Plan:   1. AKI: Baseline Cr ~1.01, 2 months ago patient worsened from likely cardiorenal syndrome secondary to cardiomyopathy.  Started on Entresto and diuretics and had worsening of kidney function.  Patient with multiple episodes of vomiting which has likely worsened kidney function in setting of dehydration. Patient with T2DM and could have worsening renal function 2/2 diabetic nephropathy. Given waxing and waning of creatinine, will obtain labs to rule out other etiology including Urine protein/creatinine, SPEP, and serum free light chains to rule out amyloidosis or myeloma.   Continue losartan.   2. Acute on chronic HFrEF: Last echo with EF of 30 to 35%.  Appears euvolemic on exam however with some crackles noted in bilateral bases on lung exam as well as small pleural effusions noted on recent CT without contrast.  Will increase to Lasix 60 IV BID for diuresis given continued SOB and desats and no response to 40 IV.   Subjective:   Patient states that she does not feel as if she is peeing more. She is still short of breath and unable to lay flat.    Objective:   BP 117/65 (BP Location: Right Leg)   Pulse 81   Temp 98.4 F (36.9 C) (Oral)   Resp 16   Ht 5\' 4"  (1.626 m)   Wt 71.7 kg   SpO2 94%   BMI 27.14 kg/m   Intake/Output Summary (Last 24 hours) at 03/03/2018 7412 Last data filed at 03/03/2018 0400 Gross per 24 hour  Intake 720 ml  Output 550 ml  Net 170 ml   Weight change: 0.499 kg  Physical Exam: General: NAD, pleasant ENTM: Moist mucous membranes Cardiovascular: RRR, no m/r/g, no LE edema Respiratory: crackles in BL bases, normal work of breathing MSK: moves 4 extremities equally Derm: no rashes appreciated  Imaging: Ct Abdomen Pelvis Wo Contrast  Result Date: 03/01/2018 CLINICAL DATA:  68 year old female with history of intermittent vomiting. EXAM: CT ABDOMEN AND PELVIS WITHOUT CONTRAST TECHNIQUE:  Multidetector CT imaging of the abdomen and pelvis was performed following the standard protocol without IV contrast. COMPARISON:  CT the abdomen and pelvis 06/01/2017. FINDINGS: Lower chest: Bilateral breast implants are incidentally noted. Atherosclerotic calcifications in the descending thoracic aorta as well as the left anterior descending and right coronary arteries. Small bilateral pleural effusions lying dependently. This is associated with some passive atelectasis in the lower lobes of the lungs bilaterally. Septal thickening throughout the visualize lung bases concerning for a background of mild interstitial pulmonary edema. Hepatobiliary: No definite suspicious cystic or solid hepatic lesions are confidently identified on today's noncontrast CT examination. Status post cholecystectomy. Pancreas: No definite pancreatic mass or peripancreatic fluid or inflammatory changes are noted on today's noncontrast CT examination. Spleen: Unremarkable. Adrenals/Urinary Tract: Left-sided pelvic kidney. No calculi identified within the collecting system of either kidney, along the course of either ureter or within the lumen of the urinary bladder. No hydroureteronephrosis. Urinary bladder is normal in appearance. Stomach/Bowel: Unenhanced appearance of the stomach is normal. No pathologic dilatation of small bowel or colon. The appendix is not confidently identified and may be surgically absent. Regardless, there are no inflammatory changes noted adjacent to the cecum to suggest the presence of an acute appendicitis at this time. Vascular/Lymphatic: Aortic atherosclerosis. No lymphadenopathy noted in the abdomen or pelvis. Reproductive: Status post hysterectomy. Ovaries are not confidently identified may be surgically absent or atrophic. Other: No significant volume of ascites.  No pneumoperitoneum. Musculoskeletal: There  are no aggressive appearing lytic or blastic lesions noted in the visualized portions of the  skeleton. IMPRESSION: 1. No acute findings are noted in the abdomen or pelvis to account for the patient's symptoms. 2. The appearance of the lung bases suggests interstitial pulmonary edema with small bilateral pleural effusions. 3. Aortic atherosclerosis, in addition to least 2 vessel coronary artery disease. Please note that although the presence of coronary artery calcium documents the presence of coronary artery disease, the severity of this disease and any potential stenosis cannot be assessed on this non-gated CT examination. Assessment for potential risk factor modification, dietary therapy or pharmacologic therapy may be warranted, if clinically indicated. 4. Additional incidental findings, as above. Electronically Signed   By: Vinnie Langton M.D.   On: 03/01/2018 16:24    Labs: BMET Recent Labs  Lab 02/25/18 0028 02/26/18 0457 02/27/18 0448 02/28/18 0541 03/01/18 0434 03/02/18 0504 03/03/18 0622  NA 138 140 140 141 141 142 139  K 5.6* 5.6* 5.0 5.1 4.6 4.7 4.6  CL 104 101 101 106 102 107 103  CO2 28 27 26 29 28 25 26   GLUCOSE 155* 162* 117* 101* 112* 119* 152*  BUN 52* 54* 54* 56* 56* 59* 59*  CREATININE 2.53* 2.30* 2.15* 2.01* 1.87* 2.27* 2.10*  CALCIUM 8.3* 8.7* 8.8* 8.5* 8.5* 8.4* 8.3*   CBC Recent Labs  Lab 02/28/18 0541 03/01/18 0434 03/02/18 0504 03/03/18 0622  WBC 4.8 4.1 4.1 3.5*  HGB 8.7* 8.7* 8.9* 9.1*  HCT 27.7* 27.3* 27.9* 28.0*  MCV 92.6 92.2 93.3 93.3  PLT 181 169 171 148*    Medications:    . atorvastatin  80 mg Oral QHS  . carvedilol  6.25 mg Oral BID WC  . clopidogrel  75 mg Oral Daily  . divalproex  500 mg Oral QHS  . furosemide  40 mg Intravenous BID  . heparin  5,000 Units Subcutaneous Q8H  . insulin aspart  0-5 Units Subcutaneous QHS  . insulin aspart  0-9 Units Subcutaneous TID WC  . isosorbide mononitrate  15 mg Oral Daily  . levothyroxine  200 mcg Oral QAC breakfast  . losartan  25 mg Oral Daily  . morphine  15 mg Oral BID  .  polyethylene glycol  17 g Oral Daily  . pregabalin  75 mg Oral Daily  . Vitamin D (Ergocalciferol)  50,000 Units Oral Q7 days    [place] Alexsia Klindt, DO PGY-2, [facility]  03/03/2018, 8:53 AM

## 2018-03-04 ENCOUNTER — Inpatient Hospital Stay (HOSPITAL_COMMUNITY): Payer: Medicare Other

## 2018-03-04 ENCOUNTER — Ambulatory Visit: Payer: Medicare Other | Admitting: Adult Health

## 2018-03-04 DIAGNOSIS — I160 Hypertensive urgency: Secondary | ICD-10-CM

## 2018-03-04 LAB — BASIC METABOLIC PANEL
ANION GAP: 9 (ref 5–15)
BUN: 61 mg/dL — ABNORMAL HIGH (ref 8–23)
CO2: 28 mmol/L (ref 22–32)
Calcium: 8.4 mg/dL — ABNORMAL LOW (ref 8.9–10.3)
Chloride: 100 mmol/L (ref 98–111)
Creatinine, Ser: 2.22 mg/dL — ABNORMAL HIGH (ref 0.44–1.00)
GFR calc Af Amer: 26 mL/min — ABNORMAL LOW (ref >60)
GFR calc non Af Amer: 22 mL/min — ABNORMAL LOW (ref >60)
Glucose, Bld: 158 mg/dL — ABNORMAL HIGH (ref 70–99)
Potassium: 5.3 mmol/L — ABNORMAL HIGH (ref 3.5–5.1)
Sodium: 137 mmol/L (ref 135–145)

## 2018-03-04 LAB — CBC
HCT: 27.2 % — ABNORMAL LOW (ref 36.0–46.0)
Hemoglobin: 8.5 g/dL — ABNORMAL LOW (ref 12.0–15.0)
MCH: 28.8 pg (ref 26.0–34.0)
MCHC: 31.3 g/dL (ref 30.0–36.0)
MCV: 92.2 fL (ref 80.0–100.0)
Platelets: 151 10*3/uL (ref 150–400)
RBC: 2.95 MIL/uL — ABNORMAL LOW (ref 3.87–5.11)
RDW: 12.7 % (ref 11.5–15.5)
WBC: 4.2 10*3/uL (ref 4.0–10.5)
nRBC: 0 % (ref 0.0–0.2)

## 2018-03-04 LAB — GLUCOSE, CAPILLARY
Glucose-Capillary: 101 mg/dL — ABNORMAL HIGH (ref 70–99)
Glucose-Capillary: 126 mg/dL — ABNORMAL HIGH (ref 70–99)
Glucose-Capillary: 136 mg/dL — ABNORMAL HIGH (ref 70–99)
Glucose-Capillary: 170 mg/dL — ABNORMAL HIGH (ref 70–99)

## 2018-03-04 LAB — PROTEIN ELECTROPHORESIS, SERUM
A/G Ratio: 1 (ref 0.7–1.7)
Albumin ELP: 3.2 g/dL (ref 2.9–4.4)
Alpha-1-Globulin: 0.3 g/dL (ref 0.0–0.4)
Alpha-2-Globulin: 0.9 g/dL (ref 0.4–1.0)
Beta Globulin: 1 g/dL (ref 0.7–1.3)
Gamma Globulin: 1.1 g/dL (ref 0.4–1.8)
Globulin, Total: 3.3 g/dL (ref 2.2–3.9)
TOTAL PROTEIN ELP: 6.5 g/dL (ref 6.0–8.5)

## 2018-03-04 LAB — PROTEIN / CREATININE RATIO, URINE
Creatinine, Urine: 135.08 mg/dL
Protein Creatinine Ratio: 5.04 mg/mg{Cre} — ABNORMAL HIGH (ref 0.00–0.15)
Total Protein, Urine: 681 mg/dL

## 2018-03-04 LAB — KAPPA/LAMBDA LIGHT CHAINS
Kappa free light chain: 46.4 mg/L — ABNORMAL HIGH (ref 3.3–19.4)
Kappa, lambda light chain ratio: 1.48 (ref 0.26–1.65)
Lambda free light chains: 31.3 mg/L — ABNORMAL HIGH (ref 5.7–26.3)

## 2018-03-04 MED ORDER — FUROSEMIDE 10 MG/ML IJ SOLN
60.0000 mg | Freq: Two times a day (BID) | INTRAMUSCULAR | Status: DC
  Administered 2018-03-04: 60 mg via INTRAVENOUS
  Filled 2018-03-04: qty 6

## 2018-03-04 MED ORDER — FUROSEMIDE 10 MG/ML IJ SOLN: 80.0000 mg | Freq: Two times a day (BID) | INTRAMUSCULAR | Status: DC

## 2018-03-04 MED ORDER — FUROSEMIDE 40 MG PO TABS: 40.0000 mg | ORAL_TABLET | Freq: Two times a day (BID) | ORAL | Status: DC

## 2018-03-04 NOTE — Progress Notes (Signed)
Physical Therapy Treatment Patient Details Name: Andrea [name] MRN: [id number] DOB: 16-Oct-1950 Today's Date: 03/04/2018    History of Present Illness Pt is a 68 y.o. female admitted 02/23/18 with chest pain and SOB; CXR concerning for pulmonary edema. Worked up for CHF. Of note, four recent admissions since 12/2017 with similar symptoms, s/p cardiac cath and PCI. PMH includes chronic pain, DM, anxiety/depression.   PT Comments    Pt received ambulating in hallway without O2; pt reporting increased fatigue, noted SpO2 down to 87%. Pt requires cues to take standing rest breaks with deep breathing, SpO2 up to 91% with this, but quickly back down to 87-88%. Educ on need for staff assist to manage O2 tank for hallway ambulation. Encouraged use of rollator during admission and at home to allow for seated rest breaks if needed. Pt remains limited by decreased attention, poor short-term memory and decreased activity tolerance.    Follow Up Recommendations  Home health PT;Supervision - Intermittent     Equipment Recommendations  (home O2)    Recommendations for Other Services       Precautions / Restrictions Precautions Precautions: Fall Precaution Comments: Watch SpO2 Restrictions Weight Bearing Restrictions: No    Mobility  Bed Mobility Overal bed mobility: Modified Independent Bed Mobility: Supine to Sit;Sit to Supine              Transfers Overall transfer level: Modified independent Equipment used: Rolling walker (2 wheeled) Transfers: Sit to/from Stand           General transfer comment: Only requiring cues to keep RW all the way to sitting EOB, as pt leaving it at end of bed then starting to walk away from it  Ambulation/Gait Ambulation/Gait assistance: Supervision Gait Distance (Feet): 500 Feet Assistive device: Rolling walker (2 wheeled) Gait Pattern/deviations: Step-through pattern;Decreased stride length;Drifts right/left Gait velocity: Decreased Gait  velocity interpretation: 1.31 - 2.62 ft/sec, indicative of limited community ambulator General Gait Details: Pt received ambulating in hallway with RW alone without O2 on. Reports fatigue, SpO2 down to 87% on RA; educ on need for supervision for safety and assist to manage O2 tank (pt stating, "No one ever told me that." Despite multiple sessions with education). Pt still fatigues during amb requiring cues for standing rest breaks and deep breathing    Stairs             Wheelchair Mobility    Modified Rankin (Stroke Patients Only)       Balance Overall balance assessment: Needs assistance Sitting-balance support: No upper extremity supported;Feet supported Sitting balance-Leahy Scale: Good     Standing balance support: Bilateral upper extremity supported;During functional activity Standing balance-Leahy Scale: Fair Standing balance comment: Can static stand without UE support but needs UE support for dynamic activity                            Cognition Arousal/Alertness: Awake/alert Behavior During Therapy: WFL for tasks assessed/performed Overall Cognitive Status: No family/caregiver present to determine baseline cognitive functioning Area of Impairment: Memory;Attention;Safety/judgement                   Current Attention Level: Alternating Memory: Decreased short-term memory   Safety/Judgement: Decreased awareness of safety;Decreased awareness of deficits            Exercises      General Comments        Pertinent Vitals/Pain Pain Assessment: No/denies pain    Home Living  Prior Function            PT Goals (current goals can now be found in the care plan section) Acute Rehab PT Goals Patient Stated Goal: to get better PT Goal Formulation: With patient Time For Goal Achievement: 03/11/18 Potential to Achieve Goals: Good Progress towards PT goals: Progressing toward goals    Frequency    Min  3X/week      PT Plan Current plan remains appropriate    Co-evaluation              AM-PAC PT "6 Clicks" Mobility   Outcome Measure  Help needed turning from your back to your side while in a flat bed without using bedrails?: None Help needed moving from lying on your back to sitting on the side of a flat bed without using bedrails?: None Help needed moving to and from a bed to a chair (including a wheelchair)?: None Help needed standing up from a chair using your arms (e.g., wheelchair or bedside chair)?: A Little Help needed to walk in hospital room?: A Little Help needed climbing 3-5 steps with a railing? : A Little 6 Click Score: 21    End of Session Equipment Utilized During Treatment: Gait belt;Oxygen Activity Tolerance: Patient tolerated treatment well;Patient limited by fatigue Patient left: in bed;with call bell/phone within reach Nurse Communication: Mobility status PT Visit Diagnosis: Unsteadiness on feet (R26.81);Muscle weakness (generalized) (M62.81)     Time: 9191-6606 PT Time Calculation (min) (ACUTE ONLY): 12 min  Charges:  $Self Care/Home Management: [place], PT, DPT Acute Rehabilitation Services  Pager [phone] Office [phone]  Derry Lory 03/04/2018, 9:15 AM

## 2018-03-04 NOTE — Progress Notes (Signed)
Patient is very anxious, anxiety medicine given. Will continue to monitor.

## 2018-03-04 NOTE — Progress Notes (Signed)
PROGRESS NOTE    Andrea [name]  ZTI:[id number] DOB: 1950-12-10 DOA: 02/23/2018 PCP: Medicine, [facility]  Brief Narrative: 68 year old female with history of chronic pain, insulin-dependent diabetes, anxiety/depression has been admitted 4 times since December with chest pain and shortness of breath.  Originally admitted in early December, found to have severe ischemic cardiomyopathy with EF of 30 to 35%, underwent diagnostic left heart catheterization in December which revealed severe three-vessel disease, initially CABG was considered but PCI felt to be best therapy, underwent stenting to proximal and distal LAD on 12/30/2017, has residual 90% diagonal and 80% OM1 stenosis . -Discharged home on aspirin and Brilinta quickly readmitted with chest pain on 12/15 and discharged the following day after she was ruled out for MI. -After this she was admitted from 12/28 to 1/2 with chest pain and shortness of breath, she was seen by cardiology, her Kary Kos was changed to Effient since it was suspected to be contributing to her dyspnea in addition she also had acute kidney injury and hence Lasix and Aldactone were held, Delene Loll was held , subsequently restarted on Entresto at discharge with a creatinine of 1.3 on 1/2. -Subsequently readmitted on 2/3 with worsening shortness of breath, chest pain chest x-ray concerning for pulmonary edema, creatinine around 1.7-1.9 -Diuresed with IV Lasix, creatinine worsened to 2.5, diuretics decreased -Also given 1 unit of PRBC for worsening anemia, creatinine subsequently improving -2/9 with 2-3episodes of vomiting, per patient report ongoing for a few weeks off and on at home, CT benign, likely med induced, depakote decreased, CT w/o contrast was benign, small effusions, Creatinine up to 2.2-Nephrology input requested 2/10: IV Lasix restarted, ARB added 2/11: Diuretic dose increased 2/12 diuretic transition to oral.  Assessment & Plan:   Acute on  chronic systolic CHF -Last echo with EF of 30 to 35% -Initially improved with diuresis however had significant worsening of creatinine to up to 2.5 range, hence diuretics were held, Delene Loll was discontinued on admission due to worsening renal failure -Also received 1 unit of PRBC and 40 mg of IV Lasix -Subsequently was felt to be euvolemic and restarted on diuretics -O2 weaned down  -Due to wide fluctuations in creatinine with diuretics nephrology consulted -IV Lasix restarted 2/10, due to suboptimal diuresis Lasix dose increased.  I will transition her to oral. Do not think that there is potential for further improvement in patient's diuresis with IV Lasix for now.  History of multivessel CAD -History of proximal and distal LAD stent on 12/10, -Has residual diagonal and OM1 stenosis, not felt to be appropriate for CABG based on CVTS assessment in December -Continue Plavix, Coreg, statin, allergy list indicates that she is allergic to aspirin on Imdur for persistent chest pain, which has improved her symptoms to some extent  Acute kidney injury -Baseline creatinine was 1.0, approximately 2 months ago, then worsened likely from cardiorenal syndrome worsened with recent Entresto, diuretics -Creatinine had improved last admission from 2 range to 1.3 at discharge by holding her Entresto, diuretics, at discharge on 1/2 was restarted on Entresto, diuretics were discontinued -On admission creatinine was around 1.7, started diuretics, worsened to 1.9 and then 2.5 -held IV Lasix then, was restarted on oral diuretics creatinine was trending down to the 1.8 range yesterday, then up to 2.2, -Nephrology input requested due to wide fluctuations in kidney function, cardiorenal syndrome -Will discontinue Entresto discharge, will notify cardiology.  Anemia due to chronic disease and iron deficiency -Likely multifactorial, volume overloaded state could be contributing to some  degree of hemodilution -In  addition being on antiplatelets increases risk of bleeding, patient denies any overt blood loss -Anemia panel with iron deficiency, given 1 unit of PRBC and IV iron  -History of colonoscopy 5 years ago which was normal per patient report, no history of melena or hematochezia -Will need oral iron at discharge  Intermittent vomiting -Mild, appears to have improved  -Likely medication related, decreased dose of Depakote and long-acting morphine  -CT abdomen pelvis was unremarkable, LFTs were benign -Supportive care  Chronic pain -On high-dose morphine and Lyrica -Renally dose Lyrica -deccreased morphine dose as above  Mild cognitive dysfunction -Higher functions limited likely secondary to high-dose narcotics and Lyrica  Diabetes mellitus -CBGs stable without Lantus which she takes at home, continue sliding scale  DVT prophylaxis: Heparin subcutaneous Code Status: Full code Family Communication: No family at bedside Disposition Plan: Home when creatinine more stable, likely 1 to 2 days  Consultants:  Nephrology   Procedures:   Antimicrobials:    Subjective: Continues to have nausea as well as shortness of breath.  Reports PND last night.  No no diarrhea.  Actually constipated.  Objective: Vitals:   03/04/18 0125 03/04/18 0429 03/04/18 1124 03/04/18 1910  BP:  (!) 154/69 (!) 152/56 (!) 149/70  Pulse:  81 79 81  Resp: 20 18 19 18   Temp:  98.3 F (36.8 C) 98.8 F (37.1 C) 97.8 F (36.6 C)  TempSrc:  Oral Oral Oral  SpO2:  94% 96% 92%  Weight:  72.5 kg    Height:        Intake/Output Summary (Last 24 hours) at 03/04/2018 1927 Last data filed at 03/04/2018 1856 Gross per 24 hour  Intake 720 ml  Output 1475 ml  Net -755 ml   Filed Weights   03/02/18 0613 03/03/18 0400 03/04/18 0429  Weight: 71.2 kg 71.7 kg 72.5 kg    Examination:  Gen: Awake, Alert, Oriented X 3,  HEENT: PERRLA, Neck supple, no JVD Lungs: Few basilar crackles CVS: RRR,No Gallops,Rubs or  new Murmurs Abd: soft, Non tender, non distended, BS present Extremities: Trace edema Skin: no new rashes  Data Reviewed:   CBC: Recent Labs  Lab 02/28/18 0541 03/01/18 0434 03/02/18 0504 03/03/18 0622 03/04/18 0411  WBC 4.8 4.1 4.1 3.5* 4.2  HGB 8.7* 8.7* 8.9* 9.1* 8.5*  HCT 27.7* 27.3* 27.9* 28.0* 27.2*  MCV 92.6 92.2 93.3 93.3 92.2  PLT 181 169 171 148* 937   Basic Metabolic Panel: Recent Labs  Lab 02/28/18 0541 03/01/18 0434 03/02/18 0504 03/03/18 0622 03/04/18 0411  NA 141 141 142 139 137  K 5.1 4.6 4.7 4.6 5.3*  CL 106 102 107 103 100  CO2 29 28 25 26 28   GLUCOSE 101* 112* 119* 152* 158*  BUN 56* 56* 59* 59* 61*  CREATININE 2.01* 1.87* 2.27* 2.10* 2.22*  CALCIUM 8.5* 8.5* 8.4* 8.3* 8.4*   GFR: Estimated Creatinine Clearance: 24.3 mL/min (A) (by C-G formula based on SCr of 2.22 mg/dL (H)). Liver Function Tests: Recent Labs  Lab 03/01/18 0434 03/02/18 0504  AST 30 38  ALT 15 20  ALKPHOS 34* 35*  BILITOT 0.8 0.8  PROT 6.0* 6.3*  ALBUMIN 2.4* 2.6*   No results for input(s): LIPASE, AMYLASE in the last 168 hours. No results for input(s): AMMONIA in the last 168 hours. Coagulation Profile: No results for input(s): INR, PROTIME in the last 168 hours. Cardiac Enzymes: No results for input(s): CKTOTAL, CKMB, CKMBINDEX, TROPONINI in the last 168  hours. BNP (last 3 results) No results for input(s): PROBNP in the last 8760 hours. HbA1C: No results for input(s): HGBA1C in the last 72 hours. CBG: Recent Labs  Lab 03/03/18 1625 03/03/18 2110 03/04/18 0728 03/04/18 1121 03/04/18 1657  GLUCAP 147* 136* 101* 126* 136*   Lipid Profile: No results for input(s): CHOL, HDL, LDLCALC, TRIG, CHOLHDL, LDLDIRECT in the last 72 hours. Thyroid Function Tests: No results for input(s): TSH, T4TOTAL, FREET4, T3FREE, THYROIDAB in the last 72 hours. Anemia Panel: No results for input(s): VITAMINB12, FOLATE, FERRITIN, TIBC, IRON, RETICCTPCT in the last 72 hours. Urine  analysis:    Component Value Date/Time   COLORURINE YELLOW 02/25/2018 2316   APPEARANCEUR HAZY (A) 02/25/2018 2316   LABSPEC 1.014 02/25/2018 2316   PHURINE 5.0 02/25/2018 2316   GLUCOSEU NEGATIVE 02/25/2018 2316   HGBUR SMALL (A) 02/25/2018 2316   BILIRUBINUR NEGATIVE 02/25/2018 2316   KETONESUR NEGATIVE 02/25/2018 2316   PROTEINUR >=300 (A) 02/25/2018 2316   UROBILINOGEN 1.0 12/02/2013 1612   NITRITE NEGATIVE 02/25/2018 2316   LEUKOCYTESUR SMALL (A) 02/25/2018 2316   Sepsis Labs: @LABRCNTIP (procalcitonin:4,lacticidven:4)  )No results found for this or any previous visit (from the past 240 hour(s)).       Radiology Studies: No results found.      Scheduled Meds: . atorvastatin  80 mg Oral QHS  . carvedilol  6.25 mg Oral BID WC  . clopidogrel  75 mg Oral Daily  . divalproex  500 mg Oral QHS  . furosemide  60 mg Intravenous BID  . heparin  5,000 Units Subcutaneous Q8H  . insulin aspart  0-5 Units Subcutaneous QHS  . insulin aspart  0-9 Units Subcutaneous TID WC  . isosorbide mononitrate  15 mg Oral Daily  . levothyroxine  200 mcg Oral QAC breakfast  . losartan  25 mg Oral Daily  . morphine  15 mg Oral BID  . polyethylene glycol  17 g Oral Daily  . pregabalin  75 mg Oral Daily  . Vitamin D (Ergocalciferol)  50,000 Units Oral Q7 days   Continuous Infusions: . sodium chloride 150 mL/hr at 02/26/18 1616     LOS: 8 days    Time spent: 56min    Author:  Berle Mull, MD [facility] 03/04/2018   To reach On-call, see care teams to locate the attending and reach out to them via [email or web address]. If 7PM-7AM, please contact night-coverage If you still have difficulty reaching the attending provider, please page the [facility] (Director on Call) for [facility] on amion for assistance.   03/04/2018, 7:27 PM

## 2018-03-04 NOTE — Progress Notes (Signed)
SATURATION QUALIFICATIONS: (This note is used to comply with regulatory documentation for home oxygen)  Patient Saturations on Room Air at Rest = 94%  Patient Saturations on Room Air while Ambulating = 87%  Patient Saturations on 1 Liters of oxygen while Ambulating = 92%  Please briefly explain why patient needs home oxygen:

## 2018-03-04 NOTE — Progress Notes (Signed)
  [facility] Progress Note    Assessment/ Plan:   1. ZOX:[id number] ~1.01,2 months ago patient worsened from likely cardiorenal syndrome secondary to cardiomyopathy. Patient with T2DM and could have worsening renal function 2/2 diabetic nephropathy. Urine protein/creatinine still pending; SPEP, UPEP, and serum free light chains to rule out amyloidosis or myeloma are in process.Continue losartan.  2. Acute on chronicHFrEF:Last echo with EF of 30-35%. Appears euvolemic on exam however with some crackles noted in bilateral bases on lung exam as well as small pleural effusions noted on recent CT without contrast. Will repeat CXR to r/o effusion or other cause for worsening SOB. Continue Lasix 60 IV BID for diuresis.Will consult dietitian for counseling on low sodium diet with fluid restriction.   Subjective:   Patient states that she was descending this morning when she was walking with physical therapy.  States that she thinks that she may have been peeing a little bit more but not sure   Objective:   BP (!) 154/69 (BP Location: Left Leg)   Pulse 81   Temp 98.3 F (36.8 C) (Oral)   Resp 18   Ht 5\' 4"  (1.626 m)   Wt 72.5 kg Comment: scale a  SpO2 94%   BMI 27.43 kg/m   Intake/Output Summary (Last 24 hours) at 03/04/2018 0932 Last data filed at 03/04/2018 0815 Gross per 24 hour  Intake 480 ml  Output 1300 ml  Net -820 ml   Weight change: 0.771 kg  Physical Exam: General: NAD, pleasant Eyes: PERRL, EOMI, no conjunctival pallor or injection ENTM: Moist mucous membranes Cardiovascular: RRR, no m/r/g, mild nonpitting LE edema Respiratory: Crackles noted at bases MSK: moves 4 extremities equally Derm: no rashes appreciated Neuro: CN II-XII grossly intact Psych: AOx3, appropriate affect  Imaging: No results found.  Labs: BMET Recent Labs  Lab 02/26/18 0457 02/27/18 0448 02/28/18 0541 03/01/18 0434 03/02/18 0504 03/03/18 0622 03/04/18 0411  NA 140  140 141 141 142 139 137  K 5.6* 5.0 5.1 4.6 4.7 4.6 5.3*  CL 101 101 106 102 107 103 100  CO2 27 26 29 28 25 26 28   GLUCOSE 162* 117* 101* 112* 119* 152* 158*  BUN 54* 54* 56* 56* 59* 59* 61*  CREATININE 2.30* 2.15* 2.01* 1.87* 2.27* 2.10* 2.22*  CALCIUM 8.7* 8.8* 8.5* 8.5* 8.4* 8.3* 8.4*   CBC Recent Labs  Lab 03/01/18 0434 03/02/18 0504 03/03/18 0622 03/04/18 0411  WBC 4.1 4.1 3.5* 4.2  HGB 8.7* 8.9* 9.1* 8.5*  HCT 27.3* 27.9* 28.0* 27.2*  MCV 92.2 93.3 93.3 92.2  PLT 169 171 148* 151    Medications:    . atorvastatin  80 mg Oral QHS  . carvedilol  6.25 mg Oral BID WC  . clopidogrel  75 mg Oral Daily  . divalproex  500 mg Oral QHS  . furosemide  60 mg Intravenous BID  . heparin  5,000 Units Subcutaneous Q8H  . insulin aspart  0-5 Units Subcutaneous QHS  . insulin aspart  0-9 Units Subcutaneous TID WC  . isosorbide mononitrate  15 mg Oral Daily  . levothyroxine  200 mcg Oral QAC breakfast  . losartan  25 mg Oral Daily  . morphine  15 mg Oral BID  . polyethylene glycol  17 g Oral Daily  . pregabalin  75 mg Oral Daily  . Vitamin D (Ergocalciferol)  50,000 Units Oral Q7 days    [place] Sarenity Ramaker, DO PGY-2, [facility]  03/04/2018, 9:32 AM

## 2018-03-05 ENCOUNTER — Inpatient Hospital Stay (HOSPITAL_COMMUNITY): Payer: Medicare Other

## 2018-03-05 DIAGNOSIS — I5022 Chronic systolic (congestive) heart failure: Secondary | ICD-10-CM

## 2018-03-05 DIAGNOSIS — J9601 Acute respiratory failure with hypoxia: Secondary | ICD-10-CM

## 2018-03-05 DIAGNOSIS — I2510 Atherosclerotic heart disease of native coronary artery without angina pectoris: Secondary | ICD-10-CM

## 2018-03-05 DIAGNOSIS — Z9861 Coronary angioplasty status: Secondary | ICD-10-CM

## 2018-03-05 LAB — ECHOCARDIOGRAM LIMITED
Height: 64 in
Weight: 2579.2 oz

## 2018-03-05 LAB — BASIC METABOLIC PANEL
Anion gap: 12 (ref 5–15)
BUN: 64 mg/dL — ABNORMAL HIGH (ref 8–23)
CALCIUM: 8.3 mg/dL — AB (ref 8.9–10.3)
CO2: 28 mmol/L (ref 22–32)
Chloride: 100 mmol/L (ref 98–111)
Creatinine, Ser: 2.45 mg/dL — ABNORMAL HIGH (ref 0.44–1.00)
GFR calc Af Amer: 23 mL/min — ABNORMAL LOW (ref >60)
GFR calc non Af Amer: 20 mL/min — ABNORMAL LOW (ref >60)
Glucose, Bld: 125 mg/dL — ABNORMAL HIGH (ref 70–99)
Potassium: 5.3 mmol/L — ABNORMAL HIGH (ref 3.5–5.1)
Sodium: 140 mmol/L (ref 135–145)

## 2018-03-05 LAB — HEPATIC FUNCTION PANEL
ALT: 20 U/L (ref 0–44)
AST: 29 U/L (ref 15–41)
Albumin: 2.6 g/dL — ABNORMAL LOW (ref 3.5–5.0)
Alkaline Phosphatase: 36 U/L — ABNORMAL LOW (ref 38–126)
Bilirubin, Direct: <0.1 mg/dL (ref 0.0–0.2)
Total Bilirubin: 0.6 mg/dL (ref 0.3–1.2)
Total Protein: 5.8 g/dL — ABNORMAL LOW (ref 6.5–8.1)

## 2018-03-05 LAB — CBC WITH DIFFERENTIAL/PLATELET
Abs Immature Granulocytes: 0.01 10*3/uL (ref 0.00–0.07)
Basophils Absolute: 0 10*3/uL (ref 0.0–0.1)
Basophils Relative: 0 %
Eosinophils Absolute: 0.2 10*3/uL (ref 0.0–0.5)
Eosinophils Relative: 4 %
HEMATOCRIT: 26.5 % — AB (ref 36.0–46.0)
HEMOGLOBIN: 8.3 g/dL — AB (ref 12.0–15.0)
Immature Granulocytes: 0 %
LYMPHS ABS: 2 10*3/uL (ref 0.7–4.0)
LYMPHS PCT: 41 %
MCH: 29.1 pg (ref 26.0–34.0)
MCHC: 31.3 g/dL (ref 30.0–36.0)
MCV: 93 fL (ref 80.0–100.0)
Monocytes Absolute: 0.5 10*3/uL (ref 0.1–1.0)
Monocytes Relative: 10 %
NEUTROS ABS: 2.2 10*3/uL (ref 1.7–7.7)
Neutrophils Relative %: 45 %
Platelets: 159 10*3/uL (ref 150–400)
RBC: 2.85 MIL/uL — ABNORMAL LOW (ref 3.87–5.11)
RDW: 12.6 % (ref 11.5–15.5)
WBC: 4.9 10*3/uL (ref 4.0–10.5)
nRBC: 0 % (ref 0.0–0.2)

## 2018-03-05 LAB — PROCALCITONIN: Procalcitonin: <0.1 ng/mL

## 2018-03-05 LAB — GLUCOSE, CAPILLARY
Glucose-Capillary: 98 mg/dL (ref 70–99)
Glucose-Capillary: 125 mg/dL — ABNORMAL HIGH (ref 70–99)
Glucose-Capillary: 129 mg/dL — ABNORMAL HIGH (ref 70–99)
Glucose-Capillary: 127 mg/dL — ABNORMAL HIGH (ref 70–99)

## 2018-03-05 LAB — BRAIN NATRIURETIC PEPTIDE: B Natriuretic Peptide: 1123.8 pg/mL — ABNORMAL HIGH (ref 0.0–100.0)

## 2018-03-05 LAB — MAGNESIUM: Magnesium: 1.7 mg/dL (ref 1.7–2.4)

## 2018-03-05 MED ORDER — ALPRAZOLAM 0.25 MG PO TABS
0.2500 mg | ORAL_TABLET | Freq: Three times a day (TID) | ORAL | Status: DC | PRN
  Administered 2018-03-05 – 2018-03-07 (×5): 0.25 mg via ORAL
  Filled 2018-03-05 (×5): qty 1

## 2018-03-05 MED ORDER — FUROSEMIDE 10 MG/ML IJ SOLN
80.0000 mg | Freq: Three times a day (TID) | INTRAMUSCULAR | Status: AC
  Administered 2018-03-05 (×2): 80 mg via INTRAVENOUS
  Filled 2018-03-05 (×2): qty 8

## 2018-03-05 MED ORDER — HYDRALAZINE HCL 50 MG PO TABS
50.0000 mg | ORAL_TABLET | Freq: Three times a day (TID) | ORAL | Status: DC
  Administered 2018-03-05: 50 mg via ORAL
  Filled 2018-03-05: qty 1

## 2018-03-05 MED ORDER — FUROSEMIDE 10 MG/ML IJ SOLN
60.0000 mg | Freq: Two times a day (BID) | INTRAMUSCULAR | Status: DC
  Administered 2018-03-05: 60 mg via INTRAVENOUS
  Filled 2018-03-05: qty 6

## 2018-03-05 MED ORDER — ISOSORBIDE MONONITRATE ER 30 MG PO TB24
30.0000 mg | ORAL_TABLET | Freq: Every day | ORAL | Status: DC
  Administered 2018-03-05 – 2018-03-07 (×3): 30 mg via ORAL
  Filled 2018-03-05 (×3): qty 1

## 2018-03-05 MED ORDER — HYDRALAZINE HCL 50 MG PO TABS
50.0000 mg | ORAL_TABLET | Freq: Three times a day (TID) | ORAL | Status: DC
  Administered 2018-03-05 – 2018-03-06 (×3): 50 mg via ORAL
  Filled 2018-03-05 (×3): qty 1

## 2018-03-05 NOTE — Progress Notes (Signed)
PROGRESS NOTE    Andrea [name]  BMW:[id number] DOB: [birth year] DOA: 02/23/2018 PCP: Medicine, [facility]  Brief Narrative: 68 year old female with history of chronic pain, insulin-dependent diabetes, anxiety/depression has been admitted 4 times since December with chest pain and shortness of breath.  Originally admitted in early December, found to have severe ischemic cardiomyopathy with EF of 30 to 35%, underwent diagnostic left heart catheterization in December which revealed severe three-vessel disease, initially CABG was considered but PCI felt to be best therapy, underwent stenting to proximal and distal LAD on 12/30/2017, has residual 90% diagonal and 80% OM1 stenosis . -Discharged home on aspirin and Brilinta quickly readmitted with chest pain on 12/15 and discharged the following day after she was ruled out for MI. -After this she was admitted from 12/28 to 1/2 with chest pain and shortness of breath, she was seen by cardiology, her Kary Kos was changed to Effient since it was suspected to be contributing to her dyspnea in addition she also had acute kidney injury and hence Lasix and Aldactone were held, Delene Loll was held , subsequently restarted on Entresto at discharge with a creatinine of 1.3 on 1/2. -Subsequently readmitted on 2/3 with worsening shortness of breath, chest pain chest x-ray concerning for pulmonary edema, creatinine around 1.7-1.9 -Diuresed with IV Lasix, creatinine worsened to 2.5, diuretics decreased -Also given 1 unit of PRBC for worsening anemia, creatinine subsequently improving -2/9 with 2-3episodes of vomiting, per patient report ongoing for a few weeks off and on at home, CT benign, likely med induced, depakote decreased, CT w/o contrast was benign, small effusions, Creatinine up to 2.2-Nephrology input requested 2/10: IV Lasix restarted, ARB added 2/11: Diuretic dose increased 2/12 diuretic transition to oral.  Subjective: Nausea improved.   No abdominal pain.  No vomiting.  Still having hard time breathing.  Feels depressed and crying.  Assessment & Plan:   Acute on chronic systolic CHF -Last echo with EF of 30 to 35%, repeat echo this admission 03/05/2018 shows improvement in EF 55 to 60%. -Initially improved with diuresis however had significant worsening of creatinine to up to 2.5 range, hence diuretics were held, Delene Loll was discontinued on admission due to worsening renal failure -Also received 1 unit of PRBC and 40 mg of IV Lasix -Subsequently was felt to be euvolemic and restarted on diuretics -O2 weaned down  -Due to wide fluctuations in creatinine with diuretics nephrology consulted -IV Lasix restarted 2/10, due to suboptimal diuresis Lasix dose increased. \ -Chest x-ray looks worse than before. Cardiology consult for further assistance. Awaiting recommendation.  History of multivessel CAD -History of proximal and distal LAD stent on 12/10, -Has residual diagonal and OM1 stenosis, not felt to be appropriate for CABG based on CVTS assessment in December -Continue Plavix, Coreg, statin, allergy list indicates that she is allergic to aspirin on Imdur for persistent chest pain, which has improved her symptoms to some extent Continue Plavix. Unable to tolerate Brilinta or Effient.  Acute kidney injury Hyperkalemia -Baseline creatinine was 1.0, approximately 2 months ago, then worsened likely from cardiorenal syndrome worsened with recent Entresto, diuretics -Creatinine had improved last admission from 2 range to 1.3 at discharge by holding her Entresto, diuretics, at discharge on 1/2 was restarted on Entresto, diuretics were discontinued -On admission creatinine was around 1.7, started diuretics, worsened to 1.9 and then 2.5 -held IV Lasix then, was restarted on oral diuretics creatinine was trending down to the 1.8 range yesterday, then up to 2.2, -Nephrology input requested due to wide  fluctuations in kidney  function, cardiorenal syndrome -Entresto and losartan currently on hold.  Anemia due to chronic disease and iron deficiency -Likely multifactorial, volume overloaded state could be contributing to some degree of hemodilution -In addition being on antiplatelets increases risk of bleeding, patient denies any overt blood loss -Anemia panel with iron deficiency, given 1 unit of PRBC and IV iron  -History of colonoscopy 5 years ago which was normal per patient report, no history of melena or hematochezia -Will need oral iron at discharge  Intermittent vomiting -Mild, appears to have improved  -Likely medication related, decreased dose of Depakote and long-acting morphine  -CT abdomen pelvis was unremarkable, LFTs were benign -Supportive care  Chronic pain -On high-dose morphine and Lyrica -Renally dose Lyrica -deccreased morphine dose as above  Mild cognitive dysfunction -Higher functions limited likely secondary to high-dose narcotics and Lyrica  Type 2 diabetes mellitus, controlled. -CBGs stable without Lantus which she takes at home, continue sliding scale  Hypothyroidism. Continue Synthroid. TSH and free T4 stable.  DVT prophylaxis: Heparin subcutaneous Code Status: Full code Family Communication: No family at bedside Disposition Plan: Home when creatinine more stable, likely 1 to 2 days  Consultants:  Nephrology   Procedures:   Antimicrobials:   Objective: Vitals:   03/05/18 1037 03/05/18 1155 03/05/18 1217 03/05/18 1218  BP: (!) 194/81 (!) 154/111 (!) 161/72 (!) 130/94  Pulse:  79    Resp:  15    Temp:  98.3 F (36.8 C)    TempSrc:  Oral    SpO2:  96%    Weight:      Height:        Intake/Output Summary (Last 24 hours) at 03/05/2018 1711 Last data filed at 03/05/2018 1552 Gross per 24 hour  Intake 1080 ml  Output 1000 ml  Net 80 ml   Filed Weights   03/03/18 0400 03/04/18 0429 03/05/18 0326  Weight: 71.7 kg 72.5 kg 73.1 kg    Examination:  Gen:  Awake, Alert, Oriented X 3,  HEENT: PERRLA, Neck supple, no JVD Lungs: Few basilar crackles CVS: RRR,No Gallops,Rubs or new Murmurs Abd: soft, Non tender, non distended, BS present Extremities: Trace edema Skin: no new rashes  Data Reviewed:   CBC: Recent Labs  Lab 03/01/18 0434 03/02/18 0504 03/03/18 0622 03/04/18 0411 03/05/18 0500  WBC 4.1 4.1 3.5* 4.2 4.9  NEUTROABS  --   --   --   --  2.2  HGB 8.7* 8.9* 9.1* 8.5* 8.3*  HCT 27.3* 27.9* 28.0* 27.2* 26.5*  MCV 92.2 93.3 93.3 92.2 93.0  PLT 169 171 148* 151 458   Basic Metabolic Panel: Recent Labs  Lab 03/01/18 0434 03/02/18 0504 03/03/18 0622 03/04/18 0411 03/05/18 0500  NA 141 142 139 137 140  K 4.6 4.7 4.6 5.3* 5.3*  CL 102 107 103 100 100  CO2 28 25 26 28 28   GLUCOSE 112* 119* 152* 158* 125*  BUN 56* 59* 59* 61* 64*  CREATININE 1.87* 2.27* 2.10* 2.22* 2.45*  CALCIUM 8.5* 8.4* 8.3* 8.4* 8.3*  MG  --   --   --   --  1.7   GFR: Estimated Creatinine Clearance: 22.1 mL/min (A) (by C-G formula based on SCr of 2.45 mg/dL (H)). Liver Function Tests: Recent Labs  Lab 03/01/18 0434 03/02/18 0504 03/05/18 0858  AST 30 38 29  ALT 15 20 20   ALKPHOS 34* 35* 36*  BILITOT 0.8 0.8 0.6  PROT 6.0* 6.3* 5.8*  ALBUMIN 2.4* 2.6* 2.6*  No results for input(s): LIPASE, AMYLASE in the last 168 hours. No results for input(s): AMMONIA in the last 168 hours. Coagulation Profile: No results for input(s): INR, PROTIME in the last 168 hours. Cardiac Enzymes: No results for input(s): CKTOTAL, CKMB, CKMBINDEX, TROPONINI in the last 168 hours. BNP (last 3 results) No results for input(s): PROBNP in the last 8760 hours. HbA1C: No results for input(s): HGBA1C in the last 72 hours. CBG: Recent Labs  Lab 03/04/18 1121 03/04/18 1657 03/04/18 2120 03/05/18 0751 03/05/18 1215  GLUCAP 126* 136* 170* 98 125*   Lipid Profile: No results for input(s): CHOL, HDL, LDLCALC, TRIG, CHOLHDL, LDLDIRECT in the last 72 hours. Thyroid  Function Tests: No results for input(s): TSH, T4TOTAL, FREET4, T3FREE, THYROIDAB in the last 72 hours. Anemia Panel: No results for input(s): VITAMINB12, FOLATE, FERRITIN, TIBC, IRON, RETICCTPCT in the last 72 hours. Urine analysis:    Component Value Date/Time   COLORURINE YELLOW 02/25/2018 2316   APPEARANCEUR HAZY (A) 02/25/2018 2316   LABSPEC 1.014 02/25/2018 2316   PHURINE 5.0 02/25/2018 2316   GLUCOSEU NEGATIVE 02/25/2018 2316   HGBUR SMALL (A) 02/25/2018 2316   BILIRUBINUR NEGATIVE 02/25/2018 2316   KETONESUR NEGATIVE 02/25/2018 2316   PROTEINUR >=300 (A) 02/25/2018 2316   UROBILINOGEN 1.0 12/02/2013 1612   NITRITE NEGATIVE 02/25/2018 2316   LEUKOCYTESUR SMALL (A) 02/25/2018 2316   Sepsis Labs: @LABRCNTIP (procalcitonin:4,lacticidven:4)  )No results found for this or any previous visit (from the past 240 hour(s)).       Radiology Studies: Dg Chest 2 View  Result Date: 03/04/2018 CLINICAL DATA:  68 year old female with a history of shortness of breath EXAM: CHEST - 2 VIEW COMPARISON:  02/25/2018, 02/23/2018 01/20/2018 FINDINGS: Cardiomediastinal silhouette unchanged in size and contour. Increasing opacity at the right lung base with obscuration the right hemidiaphragm and right heart or. Blunting at the left costophrenic angle. Opacity in sulcus on the lateral view with thickening of the fissures. Interlobular septal thickening. No pneumothorax. IMPRESSION: Increasing opacities at the lung bases compatible with increasing pleural effusions with background of edema. Multifocal infection not excluded. Electronically Signed   By: Corrie Mckusick D.O.   On: 03/04/2018 19:32        Scheduled Meds: . atorvastatin  80 mg Oral QHS  . carvedilol  6.25 mg Oral BID WC  . clopidogrel  75 mg Oral Daily  . divalproex  500 mg Oral QHS  . furosemide  80 mg Intravenous TID  . heparin  5,000 Units Subcutaneous Q8H  . hydrALAZINE  50 mg Oral Q8H  . insulin aspart  0-5 Units Subcutaneous  QHS  . insulin aspart  0-9 Units Subcutaneous TID WC  . isosorbide mononitrate  30 mg Oral Daily  . levothyroxine  200 mcg Oral QAC breakfast  . morphine  15 mg Oral BID  . polyethylene glycol  17 g Oral Daily  . pregabalin  75 mg Oral Daily  . Vitamin D (Ergocalciferol)  50,000 Units Oral Q7 days   Continuous Infusions: . sodium chloride 150 mL/hr at 02/26/18 1616     LOS: 9 days    Time spent: 69min    Author:  Berle Mull, MD [facility] 03/05/2018   To reach On-call, see care teams to locate the attending and reach out to them via [email or web address]. If 7PM-7AM, please contact night-coverage If you still have difficulty reaching the attending provider, please page the [facility] (Director on Call) for [facility] on amion for assistance.   03/05/2018, 5:11 PM

## 2018-03-05 NOTE — Progress Notes (Signed)
[facility] Progress Note    Assessment/ Plan:   1. AKI with nephrotic:BaselineCr ~1.01,2 months ago patient worsened from likely cardiorenal syndrome secondary to cardiomyopathy. Patient with T2DM and could have worsening renal function 2/2 diabetic nephropathy. Urine protein/creatinine with nephrotic range proteinuria. Will obtain labs including lipid panel, RPR, HIV, complement levels, anti-phospholipase A2 receptor antibody, hepatitis B and hepatitis C.  Considering possible renal biopsy; SPEP negative, UPEP pending, and serum free light chains with increased kappa and lambda consistent with CKD.Continue losartan.   2. Acute on chronicHFrEF:Last echo with EF of 30-35%. Appears euvolemic on exam however with some crackles noted in bilateral bases on lung exam as well as small pleural effusions noted on recent CT without contrast.  CXR much worse compared to prior.   Will increase Lasix to 80 IV 3 times daily today. Cardiology consulted per primary  Subjective:   Patient states that she is feeling about the same as she has previous and is still short of breath.   Objective:   BP (!) 173/75 (BP Location: Right Arm) Comment: Alerted nurse  Pulse 83   Temp 98.5 F (36.9 C) (Oral)   Resp 18   Ht 5\' 4"  (1.626 m)   Wt 73.1 kg Comment: scale a  SpO2 90%   BMI 27.67 kg/m   Intake/Output Summary (Last 24 hours) at 03/05/2018 0901 Last data filed at 03/05/2018 1517 Gross per 24 hour  Intake 360 ml  Output 800 ml  Net -440 ml   Weight change: 0.635 kg  Physical Exam: Gen: NAD, laying in bed CVS: RRR, no M/R/G Resp: Mild crackles at bilateral bases, normal work of breathing with nasal cannula in place Ext: No lower extremity edema noted  Imaging: Dg Chest 2 View  Result Date: 03/04/2018 CLINICAL DATA:  68 year old female with a history of shortness of breath EXAM: CHEST - 2 VIEW COMPARISON:  02/25/2018, 02/23/2018 01/20/2018 FINDINGS: Cardiomediastinal  silhouette unchanged in size and contour. Increasing opacity at the right lung base with obscuration the right hemidiaphragm and right heart or. Blunting at the left costophrenic angle. Opacity in sulcus on the lateral view with thickening of the fissures. Interlobular septal thickening. No pneumothorax. IMPRESSION: Increasing opacities at the lung bases compatible with increasing pleural effusions with background of edema. Multifocal infection not excluded. Electronically Signed   By: Corrie Mckusick D.O.   On: 03/04/2018 19:32    Labs: BMET Recent Labs  Lab 02/27/18 0448 02/28/18 0541 03/01/18 0434 03/02/18 0504 03/03/18 0622 03/04/18 0411 03/05/18 0500  NA 140 141 141 142 139 137 140  K 5.0 5.1 4.6 4.7 4.6 5.3* 5.3*  CL 101 106 102 107 103 100 100  CO2 26 29 28 25 26 28 28   GLUCOSE 117* 101* 112* 119* 152* 158* 125*  BUN 54* 56* 56* 59* 59* 61* 64*  CREATININE 2.15* 2.01* 1.87* 2.27* 2.10* 2.22* 2.45*  CALCIUM 8.8* 8.5* 8.5* 8.4* 8.3* 8.4* 8.3*   CBC Recent Labs  Lab 03/02/18 0504 03/03/18 0622 03/04/18 0411 03/05/18 0500  WBC 4.1 3.5* 4.2 4.9  NEUTROABS  --   --   --  2.2  HGB 8.9* 9.1* 8.5* 8.3*  HCT 27.9* 28.0* 27.2* 26.5*  MCV 93.3 93.3 92.2 93.0  PLT 171 148* 151 159    Medications:    . atorvastatin  80 mg Oral QHS  . carvedilol  6.25 mg Oral BID WC  . clopidogrel  75 mg Oral Daily  . divalproex  500 mg Oral  QHS  . furosemide  60 mg Intravenous BID  . heparin  5,000 Units Subcutaneous Q8H  . hydrALAZINE  50 mg Oral Q8H  . insulin aspart  0-5 Units Subcutaneous QHS  . insulin aspart  0-9 Units Subcutaneous TID WC  . isosorbide mononitrate  15 mg Oral Daily  . levothyroxine  200 mcg Oral QAC breakfast  . morphine  15 mg Oral BID  . polyethylene glycol  17 g Oral Daily  . pregabalin  75 mg Oral Daily  . Vitamin D (Ergocalciferol)  50,000 Units Oral Q7 days    Martinique Lydian Chavous, DO PGY-2, [facility]  03/05/2018, 9:01 AM

## 2018-03-05 NOTE — Progress Notes (Signed)
  Echocardiogram 2D Echocardiogram has been performed.  Jennette Dubin 03/05/2018, 1:53 PM

## 2018-03-05 NOTE — Plan of Care (Signed)
  Problem: Education: Goal: Ability to demonstrate management of disease process will improve Outcome: Progressing   Problem: Education: Goal: Ability to verbalize understanding of medication therapies will improve Outcome: Progressing   

## 2018-03-05 NOTE — Plan of Care (Signed)
Nutrition Education Note  RD consulted for nutrition education regarding CHF and diabetes.   Lab Results  Component Value Date   HGBA1C 7.7 (H) 12/31/2017   PTA DM medications are 20-50 units lantus solostar daily.   Labs reviewed: CBGS: 98-105 (inpatient orders for glycemic control are 0-5 units insulin aspart q HS and 0-9 units insulin aspart TID).    Spoke with pt at bedside, who reports she is very interested in learning about what she need to eat when she leaves the hospital, as she he been receiving mixed messages. Typical intake is 3 meals per day and one snack (Breakfast: cereal OR cream of what/oatmeal with fruit; Lunch: soup and sandwich; Dinner: fish, vegetable, and potato. HS snack graham crackers and peanut butter). Beverages consist of water, tea, or grazing on 8 ounces of regular ginerale throughout the day.   RD provided "Heart Healthy, Consistent Carbohydrate Nutrition Therapy" handout from the Academy of [employer] and Dietetics. Reviewed patient's dietary recall. Provided examples on ways to decrease sodium intake in diet. Discouraged intake of processed foods and use of salt shaker. Encouraged fresh fruits and vegetables as well as whole grain sources of carbohydrates to maximize fiber intake.   RD discussed why it is important for patient to adhere to diet recommendations, and emphasized the role of fluids, foods to avoid, and importance of weighing self daily.   Discussed different food groups and their effects on blood sugar, emphasizing carbohydrate-containing foods. Provided list of carbohydrates and recommended serving sizes of common foods.  Discussed importance of controlled and consistent carbohydrate intake throughout the day. Provided examples of ways to balance meals/snacks and encouraged intake of high-fiber, whole grain complex carbohydrates. Teach back method used.  Expect fair to good compliance.  Body mass index is 27.67 kg/m. Pt meets criteria for  overweight based on current BMI.  Current diet order is renal, carb modified with 1200 ml fluid restriction, patient is consuming approximately 100% of meals at this time. Labs and medications reviewed. No further nutrition interventions warranted at this time. RD contact information provided. If additional nutrition issues arise, please re-consult RD.   Andrea [name] A. Jimmye Norman, RD, LDN, CDE Pager: [phone] After hours Pager: [phone]

## 2018-03-05 NOTE — Progress Notes (Signed)
Physical Therapy Discharge Patient Details Name: Andrea [name] MRN: [id number] DOB: 11/12/1950 Today's Date: 03/05/2018 Time:  -     Patient discharged from PT services secondary to goals met and no further PT needs identified. Pt seen ambulating in hallway multiple times mod indep with RW. Educ on need for supplemental O2, pt reports understanding but continues to ambulate on RA (RN notified).  Yesterday, SpO2 >87% on RA. Pt reports no further questions or concerns. Will d/c acute PT.  Please see latest therapy progress note for current level of functioning and progress toward goals.     GP     , PT, DPT Acute Rehabilitation Services  Pager [phone] Office [phone]   L  03/05/2018, 8:29 AM  

## 2018-03-05 NOTE — Consult Note (Addendum)
Cardiology Consultation:   Patient ID: Andrea [name] MRN: [id number]; DOB: 08-05-1950  Admit date: 02/23/2018 Date of Consult: 03/05/2018  Primary Care Provider: Medicine, [facility] Primary Cardiologist: Kirk Ruths, MD  Primary Electrophysiologist:  None    Patient Profile:   68 y.o.femalewithrecently diagnosed left ventricular systolic dysfunction EF 62-95% and multivessel CAD, status post 2 drug-eluting stents to the LAD artery on 12/30/2017, with residual stenoses in the diagonal artery and oblique marginal branch, not amenable to PCI who is being seen today for acute on chronic systolic CHF in the setting or worsening renal function, at the request of Dr. Posey Pronto, Internal Medicine.   History of Present Illness:   Andrea [name] is a 68 y.o.femalewithrecently diagnosed left ventricular systolic dysfunction EF 28-41% and multivessel CAD, status post 2 drug-eluting stents to the LAD artery on 12/30/2017, with residual stenoses in the diagonal artery and oblique marginal branch, not amenable to PCI. She was initially placed on Brilinta but later changed to Effient due to dyspnea. Now on Plavix. In addition, she developed AKI and Andrea [name] Lasix, Entresto and Aldactone were held. She was later restarted on Entresto but was readmitted 02/23/18 with worsening SOB and worsening renal function, w/ SCr at 1.9. CXR showed pulmonary edema. She was treated w/ IV Lasix and SCr worsened up to 2.5. diuretic decreased. Later developed worsening anemia, requiring blood transfusion.   Pt is now being followed by nephrology. SCr today remains elevated at 2.45. Baseline is 1.01. Urine + for protein and there is concern for nephrotic syndrome. Further w/u pending. May need possible renal biopsy. CXR today showed worsening edema and lasix increased, 80 mg TID IV. Cardiology consulted for further recs.   She still feels SOB when she tries to have a conversation and with activity and  requiring supplemental O2. No CP this admit.   Past Medical History:  Diagnosis Date  . Breast cancer ([place])    bilat mastectomy and LUE lymphadenectomy 2014, chemoRx 2014 - 15  . Diabetes mellitus without complication ([place])   . Esophageal reflux   . Hypertension   . Hypothyroid   . Migraine   . Seizure [facility])     Past Surgical History:  Procedure Laterality Date  . ABDOMINAL HYSTERECTOMY     1984 , done for ruptured cyst and endometriosis  . APPENDECTOMY    . CHOLECYSTECTOMY     1980's  . CORONARY STENT INTERVENTION N/A 12/30/2017   Procedure: CORONARY STENT INTERVENTION;  Surgeon: Martinique, Peter M, MD;  Location: [facility] CV LAB;  Service: Cardiovascular;  Laterality: N/A;  . EYE SURGERY  08/28/2015   Right eye  . KNEE SURGERY Left   . MASTECTOMY     bilateral  . Open surgery for bowel obstruction, 2000's    . RIGHT/LEFT HEART CATH AND CORONARY ANGIOGRAPHY N/A 12/29/2017   Procedure: RIGHT/LEFT HEART CATH AND CORONARY ANGIOGRAPHY;  Surgeon: Martinique, Peter M, MD;  Location: [facility] CV LAB;  Service: Cardiovascular;  Laterality: N/A;     Home Medications:  Prior to Admission medications   Medication Sig Start Date End Date Taking? Authorizing Provider  atorvastatin (LIPITOR) 80 MG tablet Take 1 tablet (80 mg total) by mouth at bedtime. 01/28/18  Yes Lendon Colonel, NP  carvedilol (COREG) 6.25 MG tablet Take 1 tablet (6.25 mg total) by mouth 2 (two) times daily with a meal. 01/28/18  Yes Lendon Colonel, NP  clopidogrel (PLAVIX) 75 MG tablet Take 1 tablet (75 mg total) by mouth daily.  01/28/18  Yes Lendon Colonel, NP  divalproex (DEPAKOTE) 250 MG DR tablet Take 1,000 mg by mouth at bedtime.    Yes [provider]  ELDERBERRY PO Take 1 Dose by mouth daily.   Yes [provider]  furosemide (LASIX) 20 MG tablet Take 1 tablet (20 mg total) by mouth daily. 01/28/18  Yes Lendon Colonel, NP  LANTUS SOLOSTAR 100 UNIT/ML Solostar Pen Inject 20-50 Units  into the skin daily as needed (sugar levels). Take based on blood sugar 03/20/17  Yes [provider]  levothyroxine (SYNTHROID, LEVOTHROID) 200 MCG tablet Take 1 tablet (200 mcg total) by mouth at bedtime. Patient taking differently: Take 200 mcg by mouth daily before breakfast.  01/12/17  Yes Lavina Hamman, MD  morphine (KADIAN) 30 MG 24 hr capsule Take 30 mg by mouth 2 (two) times daily.   Yes [provider]  oxyCODONE-acetaminophen (PERCOCET) 10-325 MG tablet Take 1 tablet by mouth 3 (three) times daily as needed for pain. 02/03/17  Yes [provider]  pregabalin (LYRICA) 75 MG capsule Take 1 capsule (75 mg total) 2 (two) times daily by mouth. 11/24/16  Yes Osei-Bonsu, Iona Beard, MD  promethazine (PHENERGAN) 25 MG tablet Take 25 mg by mouth every 6 (six) hours as needed for nausea.  05/30/17  Yes [provider]  sacubitril-valsartan (ENTRESTO) 24-26 MG Take 1 tablet by mouth 2 (two) times daily. 01/28/18  Yes Lendon Colonel, NP  senna-docusate (SENOKOT-S) 8.6-50 MG tablet Take 2 tablets by mouth 2 (two) times daily. Patient taking differently: Take 2 tablets by mouth daily as needed for mild constipation.  01/22/18  Yes Geradine Girt, DO  Vitamin D, Ergocalciferol, (DRISDOL) 1.25 MG (50000 UT) CAPS capsule Take 50,000 Units by mouth every 7 (seven) days.   Yes [provider]    Inpatient Medications: Scheduled Meds: . atorvastatin  80 mg Oral QHS  . carvedilol  6.25 mg Oral BID WC  . clopidogrel  75 mg Oral Daily  . divalproex  500 mg Oral QHS  . furosemide  80 mg Intravenous TID  . heparin  5,000 Units Subcutaneous Q8H  . hydrALAZINE  50 mg Oral Q8H  . insulin aspart  0-5 Units Subcutaneous QHS  . insulin aspart  0-9 Units Subcutaneous TID WC  . isosorbide mononitrate  30 mg Oral Daily  . levothyroxine  200 mcg Oral QAC breakfast  . morphine  15 mg Oral BID  . polyethylene glycol  17 g Oral Daily  . pregabalin  75 mg Oral Daily  .  Vitamin D (Ergocalciferol)  50,000 Units Oral Q7 days   Continuous Infusions: . sodium chloride 150 mL/hr at 02/26/18 1616   PRN Meds: sodium chloride, ALPRAZolam, hydrALAZINE, ondansetron, oxyCODONE-acetaminophen **AND** oxyCODONE, senna-docusate  Allergies:    Allergies  Allergen Reactions  . Ticagrelor Rash  . Aspartame And Phenylalanine Hives and Diarrhea  . Aspirin Nausea And Vomiting  . Maxzide [Hydrochlorothiazide W-Triamterene] Swelling    Fluid retention  . Metformin And Related Diarrhea  . Nsaids Nausea And Vomiting  . Other Other (See Comments)    All steroids produce psychosis per pt Artificial sweeteners produce nausea and upset stomach.  Gregary Cromer [Pravastatin] Other (See Comments)    unknown  . Spironolactone   . Stadol [Butorphanol] Other (See Comments)    Toradol, etc And related- hallucinations  . Toradol [Ketorolac Tromethamine] Other (See Comments)    hallucinations  . Vistaril [Hydroxyzine Hcl] Other (See Comments)  unknown  . Erythromycin Itching and Rash  . Morphine And Related Hives and Rash    Iv morphine.  . Penicillins Hives, Itching and Rash    Has patient had a PCN reaction causing immediate rash, facial/tongue/throat swelling, SOB or lightheadedness with hypotension: yes Has patient had a PCN reaction causing severe rash involving mucus membranes or skin necrosis: unknown Has patient had a PCN reaction that required hospitalization : unknown Has patient had a PCN reaction occurring within the last 10 years: no If all of the above answers are "NO", then may proceed with Cephalosporin use.     Social History:   Social History   Socioeconomic History  . Marital status: Divorced    Spouse name: Not on file  . Number of children: Not on file  . Years of education: Not on file  . Highest education level: Not on file  Occupational History  . Not on file  Social Needs  . Financial resource strain: Not on file  . Food insecurity:     Worry: Never true    Inability: Never true  . Transportation needs:    Medical: No    Non-medical: No  Tobacco Use  . Smoking status: Never Smoker  . Smokeless tobacco: Never Used  Substance and Sexual Activity  . Alcohol use: No  . Drug use: No  . Sexual activity: Not Currently  Lifestyle  . Physical activity:    Days per week: Patient refused    Minutes per session: Patient refused  . Stress: Only a little  Relationships  . Social connections:    Talks on phone: Patient refused    Gets together: Patient refused    Attends religious service: Patient refused    Active member of club or organization: Patient refused    Attends meetings of clubs or organizations: Patient refused    Relationship status: Patient refused  . Intimate partner violence:    Fear of current or ex partner: Patient refused    Emotionally abused: Patient refused    Physically abused: Patient refused    Forced sexual activity: Patient refused  Other Topics Concern  . Not on file  Social History Narrative   NONE    Family History:    Family History  Problem Relation Age of Onset  . Sudden Cardiac Death Neg Hx      ROS:  Please see the history of present illness.   All other ROS reviewed and negative.     Physical Exam/Data:   Vitals:   03/05/18 1037 03/05/18 1155 03/05/18 1217 03/05/18 1218  BP: (!) 194/81 (!) 154/111 (!) 161/72 (!) 130/94  Pulse:  79    Resp:  15    Temp:  98.3 F (36.8 C)    TempSrc:  Oral    SpO2:  96%    Weight:      Height:        Intake/Output Summary (Last 24 hours) at 03/05/2018 1730 Last data filed at 03/05/2018 1552 Gross per 24 hour  Intake 1080 ml  Output 1000 ml  Net 80 ml   Last 3 Weights 03/05/2018 03/04/2018 03/03/2018  Weight (lbs) 161 lb 3.2 oz 159 lb 12.8 oz 158 lb 1.6 oz  Weight (kg) 73.12 kg 72.485 kg 71.714 kg     Body mass index is 27.67 kg/m.  General:  Well nourished, well developed, in no acute distress HEENT: normal Lymph: no  adenopathy Neck: no JVD Endocrine:  No thryomegaly Vascular: No carotid bruits;  FA pulses 2+ bilaterally without bruits  Cardiac:  normal S1, S2; RRR; no murmur  Lungs:  Bibasilar crackles L>R Abd: soft, nontender, no hepatomegaly  Ext: no edema Musculoskeletal:  No deformities, BUE and BLE strength normal and equal Skin: warm and dry  Neuro:  CNs 2-12 intact, no focal abnormalities noted Psych:  Normal affect   EKG:  The EKG was personally reviewed and demonstrates:  Admit EKG 2/3 NSR Telemetry:  Telemetry was personally reviewed and demonstrates:  NSR  Relevant CV Studies: Echocardiogram 12/26/2017 Left ventricle: Apical images sub optimal due to breast implants. The cavity size was mildly dilated. Wall thickness was increased in a pattern of mild LVH. Systolic function was moderately to severely reduced. The estimated ejection fraction was in the range of 30% to 35%. Diffuse hypokinesis. Doppler parameters are consistent with both elevated ventricular end-diastolic filling pressure and elevated left atrial filling pressure. - Left atrium: The atrium was moderately dilated.  Cardiac Cath 12/30/2017  Prox LAD lesion is 90% stenosed.  A drug-eluting stent was successfully placed using a STENT SYNERGY DES 3X38.  Post intervention, there is 0% residual stenosis.  Dist LAD lesion is 90% stenosed.  A drug-eluting stent was successfully placed using a STENT SYNERGY DES 2.5X16.  Post intervention, there is a 0% residual stenosis.  1. Successful PCI of the proximal and distal LAD with DES x 2.  Plan: since patient is ASA allergic I would recommend Brilinta monotherapy 90 mg bid for one year then 60 mg bid long term. Patient is a candidate for discharge from a cardiac standpoint tomorrow. Andrea [name] residual CAD will be treated medically.   Laboratory Data:  Chemistry Recent Labs  Lab 03/03/18 0622 03/04/18 0411 03/05/18 0500  NA 139 137 140  K 4.6 5.3* 5.3*  CL  103 100 100  CO2 26 28 28   GLUCOSE 152* 158* 125*  BUN 59* 61* 64*  CREATININE 2.10* 2.22* 2.45*  CALCIUM 8.3* 8.4* 8.3*  GFRNONAA 24* 22* 20*  GFRAA 28* 26* 23*  ANIONGAP 10 9 12     Recent Labs  Lab 03/01/18 0434 03/02/18 0504 03/05/18 0858  PROT 6.0* 6.3* 5.8*  ALBUMIN 2.4* 2.6* 2.6*  AST 30 38 29  ALT 15 20 20   ALKPHOS 34* 35* 36*  BILITOT 0.8 0.8 0.6   Hematology Recent Labs  Lab 03/03/18 0622 03/04/18 0411 03/05/18 0500  WBC 3.5* 4.2 4.9  RBC 3.00* 2.95* 2.85*  HGB 9.1* 8.5* 8.3*  HCT 28.0* 27.2* 26.5*  MCV 93.3 92.2 93.0  MCH 30.3 28.8 29.1  MCHC 32.5 31.3 31.3  RDW 12.8 12.7 12.6  PLT 148* 151 159   Cardiac EnzymesNo results for input(s): TROPONINI in the last 168 hours. No results for input(s): TROPIPOC in the last 168 hours.  BNP Recent Labs  Lab 03/05/18 0500  BNP 1,123.8*    DDimer No results for input(s): DDIMER in the last 168 hours.  Radiology/Studies:  Dg Chest 2 View  Result Date: 03/04/2018 CLINICAL DATA:  68 year old female with a history of shortness of breath EXAM: CHEST - 2 VIEW COMPARISON:  02/25/2018, 02/23/2018 01/20/2018 FINDINGS: Cardiomediastinal silhouette unchanged in size and contour. Increasing opacity at the right lung base with obscuration the right hemidiaphragm and right heart or. Blunting at the left costophrenic angle. Opacity in sulcus on the lateral view with thickening of the fissures. Interlobular septal thickening. No pneumothorax. IMPRESSION: Increasing opacities at the lung bases compatible with increasing pleural effusions with background of edema. Multifocal infection not  excluded. Electronically Signed   By: Corrie Mckusick D.O.   On: 03/04/2018 19:32    Assessment and Plan:   68 y.o.femalewithrecently diagnosed left ventricular systolic dysfunction EF 25-05% and multivessel CAD, status post 2 drug-eluting stents to the LAD artery on 12/30/2017, with residual stenoses in the diagonal artery and oblique marginal  branch, not amenable to PCI who is being seen today for acute on chronic systolic CHF in the setting or worsening renal function, at the request of Dr. Posey Pronto, Internal Medicine.   1. Acute on Chronic Systolic CHF: EF 39-76% by recent echo. She complains of dyspnea and reduced UOP. CXR today showed worsening edema compared to prior. Crackles noted on exam. No LEE. Lasix has been increased to 80 mg TID, IV. Monitor response. Strict I/Os and daily weights. Consider repeat CXR in a few days to assess for interval change. Continue Coreg for systolic HF. ARB/Entresto remains on hold due to renal function. Continue hydralazine and Imdur for afterload reduction. If pressures remain high, she may need further increase in meds. Monitor.   2. CAD: recent cath in Dec 2019 showed multivessel CAD, status post 2 drug-eluting stents to the LAD artery on 12/30/2017, with residual stenoses in the diagonal artery and oblique marginal branch, not amenable to PCI. Currently CP free. Continue ASA, Plavix, beta blocker and statin.   3. AKI: sCr up to 2.4. prior baseline 1.0 2 months ago. With proteinuria and concern for nephrotic syndrome. ARB currently on hold. Getting IV Lasix. Nephrology following.    For questions or updates, please contact [place] Please consult [email or web address] for contact info under     Signed, Lyda Jester, PA-C  03/05/2018 5:30 PM

## 2018-03-06 LAB — RENAL FUNCTION PANEL
Albumin: 2.4 g/dL — ABNORMAL LOW (ref 3.5–5.0)
Anion gap: 10 (ref 5–15)
BUN: 64 mg/dL — ABNORMAL HIGH (ref 8–23)
CHLORIDE: 100 mmol/L (ref 98–111)
CO2: 30 mmol/L (ref 22–32)
Calcium: 8.2 mg/dL — ABNORMAL LOW (ref 8.9–10.3)
Creatinine, Ser: 2.45 mg/dL — ABNORMAL HIGH (ref 0.44–1.00)
GFR calc Af Amer: 23 mL/min — ABNORMAL LOW (ref >60)
GFR calc non Af Amer: 20 mL/min — ABNORMAL LOW (ref >60)
Glucose, Bld: 121 mg/dL — ABNORMAL HIGH (ref 70–99)
Phosphorus: 4.6 mg/dL (ref 2.5–4.6)
Potassium: 4.8 mmol/L (ref 3.5–5.1)
Sodium: 140 mmol/L (ref 135–145)

## 2018-03-06 LAB — GLUCOSE, CAPILLARY
Glucose-Capillary: 92 mg/dL (ref 70–99)
Glucose-Capillary: 123 mg/dL — ABNORMAL HIGH (ref 70–99)
Glucose-Capillary: 150 mg/dL — ABNORMAL HIGH (ref 70–99)
Glucose-Capillary: 154 mg/dL — ABNORMAL HIGH (ref 70–99)

## 2018-03-06 LAB — LIPID PANEL
CHOL/HDL RATIO: 3.7 ratio
Cholesterol: 96 mg/dL (ref 0–200)
HDL: 26 mg/dL — ABNORMAL LOW (ref >40)
LDL Cholesterol: 32 mg/dL (ref 0–99)
Triglycerides: 189 mg/dL — ABNORMAL HIGH (ref <150)
VLDL: 38 mg/dL (ref 0–40)

## 2018-03-06 LAB — HIV ANTIBODY (ROUTINE TESTING W REFLEX): HIV SCREEN 4TH GENERATION: NONREACTIVE

## 2018-03-06 LAB — RPR: RPR Ser Ql: NONREACTIVE

## 2018-03-06 MED ORDER — HYDRALAZINE HCL 50 MG PO TABS
50.0000 mg | ORAL_TABLET | Freq: Once | ORAL | Status: AC
  Administered 2018-03-06: 50 mg via ORAL
  Filled 2018-03-06: qty 1

## 2018-03-06 MED ORDER — METOLAZONE 2.5 MG PO TABS
2.5000 mg | ORAL_TABLET | Freq: Once | ORAL | Status: AC
  Administered 2018-03-06: 2.5 mg via ORAL
  Filled 2018-03-06: qty 1

## 2018-03-06 MED ORDER — CARVEDILOL 6.25 MG PO TABS
6.2500 mg | ORAL_TABLET | Freq: Once | ORAL | Status: AC
  Administered 2018-03-06: 6.25 mg via ORAL
  Filled 2018-03-06: qty 1

## 2018-03-06 MED ORDER — LACTULOSE 10 GM/15ML PO SOLN
20.0000 g | Freq: Every day | ORAL | Status: DC
  Filled 2018-03-06: qty 30

## 2018-03-06 MED ORDER — FUROSEMIDE 10 MG/ML IJ SOLN
80.0000 mg | Freq: Three times a day (TID) | INTRAMUSCULAR | Status: DC
  Administered 2018-03-06 – 2018-03-07 (×3): 80 mg via INTRAVENOUS
  Filled 2018-03-06 (×3): qty 8

## 2018-03-06 MED ORDER — CARVEDILOL 12.5 MG PO TABS
12.5000 mg | ORAL_TABLET | Freq: Two times a day (BID) | ORAL | Status: DC
  Administered 2018-03-06 – 2018-03-07 (×2): 12.5 mg via ORAL
  Filled 2018-03-06 (×2): qty 1

## 2018-03-06 MED ORDER — HYDRALAZINE HCL 50 MG PO TABS
100.0000 mg | ORAL_TABLET | Freq: Three times a day (TID) | ORAL | Status: DC
  Administered 2018-03-06 – 2018-03-07 (×3): 100 mg via ORAL
  Filled 2018-03-06 (×3): qty 2

## 2018-03-06 NOTE — Progress Notes (Signed)
[facility] Progress Note    Assessment/ Plan:   1. AKI with nephrotic proteinuria:BaselineCr ~1.01,2 months ago patient worsened from likely cardiorenal syndrome. Patient with T2DM and could have worsening renal function 2/2 diabetic nephropathy. Urine protein/creatininewith nephrotic range proteinuria.    Nephrotic work-up pending with RPR, HIV, complement levels, anti-phospholipase A2 receptor antibody, hepatitis B and hepatitis C. patient not candidate for renal biopsy at this time given need for antiplatelet therapy after recent PCI;UPEP pending.   Unclear why losartan was discontinued, patient would benefit from ARB therapy from cardiac and renal standpoint.  Patient still appears fluid overloaded and is only down net 1.6 L since hospitalization.    Will increase IV Lasix to 80 mg 3 times daily and add metolazone 2.5 mg.  Anticipate slight bump in creatinine tomorrow however given clinical picture believe the patient will benefit from continued diuresis.  2. Acute on chronicHFrEF:Last echo with EF of 30-35%, recent echo shows market improvement with new EF of 55 - 60% s/p PCI.  Patient appears overloaded on exam with increased swelling and upper and lower extremities.  CXR on 03/05/2018 much worse compared to prior. Continue Lasix to 80 IV 3 times daily today. Cardiology following  3. CAD: S/p recent PCI.  Patient on aspirin, clopidogrel, statin, Imdur.  Carvedilol increased to 12.5 mg.  Hydralazine also increased per cardiology.   Subjective:   Patient reports that she feels about the same as she did yesterday.  Does report less episodes of shortness of breath.   Objective:   BP (!) 180/67 (BP Location: Right Arm)   Pulse 78   Temp 97.7 F (36.5 C) (Oral)   Resp 18   Ht 5\' 4"  (1.626 m)   Wt 72.6 kg Comment: scale a  SpO2 92%   BMI 27.46 kg/m   Intake/Output Summary (Last 24 hours) at 03/06/2018 1610 Last data filed at 03/06/2018 9604 Gross per 24 hour   Intake 720 ml  Output 950 ml  Net -230 ml   Weight change: -0.544 kg  Physical Exam: Gen: NAD, laying in bed CVS: RRR, no M/R/G Resp: CTA BL, normal work of breathing Abd: Nondistended, nontender Ext: Diffuse nonpitting edema in extremities  Imaging: Dg Chest 2 View  Result Date: 03/04/2018 CLINICAL DATA:  68 year old female with a history of shortness of breath EXAM: CHEST - 2 VIEW COMPARISON:  02/25/2018, 02/23/2018 01/20/2018 FINDINGS: Cardiomediastinal silhouette unchanged in size and contour. Increasing opacity at the right lung base with obscuration the right hemidiaphragm and right heart or. Blunting at the left costophrenic angle. Opacity in sulcus on the lateral view with thickening of the fissures. Interlobular septal thickening. No pneumothorax. IMPRESSION: Increasing opacities at the lung bases compatible with increasing pleural effusions with background of edema. Multifocal infection not excluded. Electronically Signed   By: Corrie Mckusick D.O.   On: 03/04/2018 19:32    Labs: BMET Recent Labs  Lab 02/28/18 0541 03/01/18 0434 03/02/18 0504 03/03/18 0622 03/04/18 0411 03/05/18 0500 03/06/18 0250  NA 141 141 142 139 137 140 140  K 5.1 4.6 4.7 4.6 5.3* 5.3* 4.8  CL 106 102 107 103 100 100 100  CO2 29 28 25 26 28 28 30   GLUCOSE 101* 112* 119* 152* 158* 125* 121*  BUN 56* 56* 59* 59* 61* 64* 64*  CREATININE 2.01* 1.87* 2.27* 2.10* 2.22* 2.45* 2.45*  CALCIUM 8.5* 8.5* 8.4* 8.3* 8.4* 8.3* 8.2*  PHOS  --   --   --   --   --   --  4.6   CBC Recent Labs  Lab 03/02/18 0504 03/03/18 0622 03/04/18 0411 03/05/18 0500  WBC 4.1 3.5* 4.2 4.9  NEUTROABS  --   --   --  2.2  HGB 8.9* 9.1* 8.5* 8.3*  HCT 27.9* 28.0* 27.2* 26.5*  MCV 93.3 93.3 92.2 93.0  PLT 171 148* 151 159    Medications:    . atorvastatin  80 mg Oral QHS  . carvedilol  6.25 mg Oral BID WC  . clopidogrel  75 mg Oral Daily  . divalproex  500 mg Oral QHS  . heparin  5,000 Units Subcutaneous Q8H  .  hydrALAZINE  50 mg Oral Q8H  . insulin aspart  0-5 Units Subcutaneous QHS  . insulin aspart  0-9 Units Subcutaneous TID WC  . isosorbide mononitrate  30 mg Oral Daily  . levothyroxine  200 mcg Oral QAC breakfast  . morphine  15 mg Oral BID  . polyethylene glycol  17 g Oral Daily  . pregabalin  75 mg Oral Daily  . Vitamin D (Ergocalciferol)  50,000 Units Oral Q7 days    Martinique Keondre Markson, DO PGY-2, [facility]  03/06/2018, 9:06 AM

## 2018-03-06 NOTE — Progress Notes (Signed)
PROGRESS NOTE    Andrea [name]  JSE:[id number] DOB: [birth year] DOA: 02/23/2018 PCP: Medicine, [facility]  Brief Narrative: 68 year old female with history of chronic pain, insulin-dependent diabetes, anxiety/depression has been admitted 4 times since December with chest pain and shortness of breath.  Originally admitted in early December, found to have severe ischemic cardiomyopathy with EF of 30 to 35%, underwent diagnostic left heart catheterization in December which revealed severe three-vessel disease, initially CABG was considered but PCI felt to be best therapy, underwent stenting to proximal and distal LAD on 12/30/2017, has residual 90% diagonal and 80% OM1 stenosis . -Discharged home on aspirin and Brilinta quickly readmitted with chest pain on 12/15 and discharged the following day after she was ruled out for MI. -After this she was admitted from 12/28 to 1/2 with chest pain and shortness of breath, she was seen by cardiology, her Kary Kos was changed to Effient since it was suspected to be contributing to her dyspnea in addition she also had acute kidney injury and hence Lasix and Aldactone were held, Delene Loll was held , subsequently restarted on Entresto at discharge with a creatinine of 1.3 on 1/2. -Subsequently readmitted on 2/3 with worsening shortness of breath, chest pain chest x-ray concerning for pulmonary edema, creatinine around 1.7-1.9 -Diuresed with IV Lasix, creatinine worsened to 2.5, diuretics decreased -Also given 1 unit of PRBC for worsening anemia, creatinine subsequently improving -2/9 with 2-3episodes of vomiting, per patient report ongoing for a few weeks off and on at home, CT benign, likely med induced, depakote decreased, CT w/o contrast was benign, small effusions, Creatinine up to 2.2-Nephrology input requested 2/10: IV Lasix restarted, ARB added 2/11: Diuretic dose increased 2/12 diuretic transition to oral.  Subjective: Feeling somewhat  better.  No nausea but reports last night had one episode of vomiting.  Minimal bowel movement.  No abdominal pain.  Assessment & Plan:   Acute on chronic systolic CHF -Last echo with EF of 30 to 35%, repeat echo this admission 03/05/2018 shows improvement in EF 55 to 60%. -Initially improved with diuresis however had significant worsening of creatinine to up to 2.5 range, hence diuretics were held, Delene Loll was discontinued on admission due to worsening renal failure -Also received 1 unit of PRBC and 40 mg of IV Lasix -Subsequently was felt to be euvolemic and restarted on diuretics -O2 weaned down  -Due to wide fluctuations in creatinine with diuretics nephrology consulted -IV Lasix restarted 2/10, due to suboptimal diuresis Lasix dose increased. -Chest x-ray looks worse than before. Cardiology consult for further assistance.  History of multivessel CAD -History of proximal and distal LAD stent on 12/10, -Has residual diagonal and OM1 stenosis, not felt to be appropriate for CABG based on CVTS assessment in December -Continue Plavix, Coreg, statin, allergy list indicates that she is allergic to aspirin on Imdur for persistent chest pain, which has improved her symptoms to some extent Continue Plavix. Unable to tolerate Brilinta or Effient.  Acute kidney injury Hyperkalemia -Baseline creatinine was 1.0, approximately 2 months ago, then worsened likely from cardiorenal syndrome worsened with recent Entresto, diuretics -Creatinine had improved last admission from 2 range to 1.3 at discharge by holding her Entresto, diuretics, at discharge on 1/2 was restarted on Entresto, diuretics were discontinued -On admission creatinine was around 1.7, started diuretics, worsened to 1.9 and then 2.5 -held IV Lasix then, was restarted on oral diuretics creatinine was trending down to the 1.8 range yesterday, then up to 2.2, -Nephrology input requested due to wide  fluctuations in kidney function,  cardiorenal syndrome -Entresto and losartan currently on hold. -Does not have nephrotic syndrome -No biopsy or further work-up planned for now.  Diuresis is the treatment recommended at present.  Anemia due to chronic disease and iron deficiency -Likely multifactorial, volume overloaded state could be contributing to some degree of hemodilution -In addition being on antiplatelets increases risk of bleeding, patient denies any overt blood loss -Anemia panel with iron deficiency, given 1 unit of PRBC and IV iron  -History of colonoscopy 5 years ago which was normal per patient report, no history of melena or hematochezia -Will need oral iron at discharge  Intermittent vomiting -Mild, appears to have improved  -Likely medication related, decreased dose of Depakote and long-acting morphine  -CT abdomen pelvis was unremarkable, LFTs were benign -Supportive care  Chronic pain -On high-dose morphine and Lyrica -Renally dose Lyrica -deccreased morphine dose as above  Mild cognitive dysfunction -Higher functions limited likely secondary to high-dose narcotics and Lyrica  Type 2 diabetes mellitus, controlled. -CBGs stable without Lantus which she takes at home, continue sliding scale  Hypothyroidism. Continue Synthroid. TSH and free T4 stable.  DVT prophylaxis: Heparin subcutaneous Code Status: Full code Family Communication: No family at bedside Disposition Plan: Home when creatinine more stable,  Consultants:  Nephrology Cardiology   Procedures:  Echocardiogram   Antimicrobials:   Objective: Vitals:   03/06/18 0326 03/06/18 0844 03/06/18 1157 03/06/18 1753  BP: (!) 151/66 (!) 180/67 (!) 156/57 (!) 150/66  Pulse: 79 78 78   Resp: 18  18   Temp: 97.7 F (36.5 C)  98.4 F (36.9 C)   TempSrc: Oral  Oral   SpO2: 92%  97%   Weight: 72.6 kg     Height:        Intake/Output Summary (Last 24 hours) at 03/06/2018 1843 Last data filed at 03/06/2018 1316 Gross per 24 hour    Intake 720 ml  Output 750 ml  Net -30 ml   Filed Weights   03/04/18 0429 03/05/18 0326 03/06/18 0326  Weight: 72.5 kg 73.1 kg 72.6 kg    Examination:  Gen: Awake, Alert, Oriented X 3,  HEENT: PERRLA, Neck supple, no JVD Lungs: Few basilar crackles CVS: RRR,No Gallops,Rubs or new Murmurs Abd: soft, Non tender, non distended, BS present Extremities: Trace edema Skin: no new rashes  Data Reviewed:   CBC: Recent Labs  Lab 03/01/18 0434 03/02/18 0504 03/03/18 0622 03/04/18 0411 03/05/18 0500  WBC 4.1 4.1 3.5* 4.2 4.9  NEUTROABS  --   --   --   --  2.2  HGB 8.7* 8.9* 9.1* 8.5* 8.3*  HCT 27.3* 27.9* 28.0* 27.2* 26.5*  MCV 92.2 93.3 93.3 92.2 93.0  PLT 169 171 148* 151 474   Basic Metabolic Panel: Recent Labs  Lab 03/02/18 0504 03/03/18 0622 03/04/18 0411 03/05/18 0500 03/06/18 0250  NA 142 139 137 140 140  K 4.7 4.6 5.3* 5.3* 4.8  CL 107 103 100 100 100  CO2 25 26 28 28 30   GLUCOSE 119* 152* 158* 125* 121*  BUN 59* 59* 61* 64* 64*  CREATININE 2.27* 2.10* 2.22* 2.45* 2.45*  CALCIUM 8.4* 8.3* 8.4* 8.3* 8.2*  MG  --   --   --  1.7  --   PHOS  --   --   --   --  4.6   GFR: Estimated Creatinine Clearance: 22.1 mL/min (A) (by C-G formula based on SCr of 2.45 mg/dL (H)). Liver Function Tests: Recent  Labs  Lab 03/01/18 0434 03/02/18 0504 03/05/18 0858 03/06/18 0250  AST 30 38 29  --   ALT 15 20 20   --   ALKPHOS 34* 35* 36*  --   BILITOT 0.8 0.8 0.6  --   PROT 6.0* 6.3* 5.8*  --   ALBUMIN 2.4* 2.6* 2.6* 2.4*   No results for input(s): LIPASE, AMYLASE in the last 168 hours. No results for input(s): AMMONIA in the last 168 hours. Coagulation Profile: No results for input(s): INR, PROTIME in the last 168 hours. Cardiac Enzymes: No results for input(s): CKTOTAL, CKMB, CKMBINDEX, TROPONINI in the last 168 hours. BNP (last 3 results) No results for input(s): PROBNP in the last 8760 hours. HbA1C: No results for input(s): HGBA1C in the last 72  hours. CBG: Recent Labs  Lab 03/05/18 1701 03/05/18 2126 03/06/18 0726 03/06/18 1155 03/06/18 1613  GLUCAP 129* 127* 92 123* 150*   Lipid Profile: Recent Labs    03/06/18 0250  CHOL 96  HDL 26*  LDLCALC 32  TRIG 189*  CHOLHDL 3.7   Thyroid Function Tests: No results for input(s): TSH, T4TOTAL, FREET4, T3FREE, THYROIDAB in the last 72 hours. Anemia Panel: No results for input(s): VITAMINB12, FOLATE, FERRITIN, TIBC, IRON, RETICCTPCT in the last 72 hours. Urine analysis:    Component Value Date/Time   COLORURINE YELLOW 02/25/2018 2316   APPEARANCEUR HAZY (A) 02/25/2018 2316   LABSPEC 1.014 02/25/2018 2316   PHURINE 5.0 02/25/2018 2316   GLUCOSEU NEGATIVE 02/25/2018 2316   HGBUR SMALL (A) 02/25/2018 2316   BILIRUBINUR NEGATIVE 02/25/2018 2316   KETONESUR NEGATIVE 02/25/2018 2316   PROTEINUR >=300 (A) 02/25/2018 2316   UROBILINOGEN 1.0 12/02/2013 1612   NITRITE NEGATIVE 02/25/2018 2316   LEUKOCYTESUR SMALL (A) 02/25/2018 2316   Radiology Studies: No results found.      Scheduled Meds: . atorvastatin  80 mg Oral QHS  . carvedilol  12.5 mg Oral BID WC  . clopidogrel  75 mg Oral Daily  . divalproex  500 mg Oral QHS  . furosemide  80 mg Intravenous TID  . heparin  5,000 Units Subcutaneous Q8H  . hydrALAZINE  100 mg Oral Q8H  . insulin aspart  0-5 Units Subcutaneous QHS  . insulin aspart  0-9 Units Subcutaneous TID WC  . isosorbide mononitrate  30 mg Oral Daily  . levothyroxine  200 mcg Oral QAC breakfast  . morphine  15 mg Oral BID  . polyethylene glycol  17 g Oral Daily  . pregabalin  75 mg Oral Daily  . Vitamin D (Ergocalciferol)  50,000 Units Oral Q7 days   Continuous Infusions: . sodium chloride 150 mL/hr at 02/26/18 1616     LOS: 10 days    Time spent: 54min    Author:  Berle Mull, MD [facility] 03/06/2018   To reach On-call, see care teams to locate the attending and reach out to them via [email or web address]. If 7PM-7AM, please contact  night-coverage If you still have difficulty reaching the attending provider, please page the [facility] (Director on Call) for [facility] on amion for assistance.   03/06/2018, 6:43 PM

## 2018-03-06 NOTE — Progress Notes (Signed)
Progress Note  Patient Name: Andrea [name] Date of Encounter: 03/06/2018  Primary Cardiologist: Kirk Ruths, MD   Subjective   Had an episode of nausea and emesis this morning.  She reports it was bilious.  She has since been able to eat her breakfast without difficulty.  Inpatient Medications    Scheduled Meds: . atorvastatin  80 mg Oral QHS  . carvedilol  6.25 mg Oral BID WC  . clopidogrel  75 mg Oral Daily  . divalproex  500 mg Oral QHS  . heparin  5,000 Units Subcutaneous Q8H  . hydrALAZINE  50 mg Oral Q8H  . insulin aspart  0-5 Units Subcutaneous QHS  . insulin aspart  0-9 Units Subcutaneous TID WC  . isosorbide mononitrate  30 mg Oral Daily  . levothyroxine  200 mcg Oral QAC breakfast  . morphine  15 mg Oral BID  . polyethylene glycol  17 g Oral Daily  . pregabalin  75 mg Oral Daily  . Vitamin D (Ergocalciferol)  50,000 Units Oral Q7 days   Continuous Infusions: . sodium chloride 150 mL/hr at 02/26/18 1616   PRN Meds: sodium chloride, ALPRAZolam, hydrALAZINE, ondansetron, oxyCODONE-acetaminophen **AND** oxyCODONE, senna-docusate   Vital Signs    Vitals:   03/05/18 1736 03/05/18 1945 03/06/18 0326 03/06/18 0844  BP: (!) 173/78 (!) 106/43 (!) 151/66 (!) 180/67  Pulse: 77 73 79 78  Resp:  18 18   Temp:  98 F (36.7 C) 97.7 F (36.5 C)   TempSrc:  Oral Oral   SpO2:  91% 92%   Weight:   72.6 kg   Height:        Intake/Output Summary (Last 24 hours) at 03/06/2018 0931 Last data filed at 03/06/2018 0734 Gross per 24 hour  Intake 720 ml  Output 950 ml  Net -230 ml   Last 3 Weights 03/06/2018 03/05/2018 03/04/2018  Weight (lbs) 160 lb 161 lb 3.2 oz 159 lb 12.8 oz  Weight (kg) 72.576 kg 73.12 kg 72.485 kg      Telemetry    Sinus rhythm.  No events - Personally Reviewed  ECG    N/a - Personally Reviewed  Physical Exam   VS:  BP (!) 180/67 (BP Location: Right Arm)   Pulse 78   Temp 97.7 F (36.5 C) (Oral)   Resp 18   Ht 5\' 4"  (1.626 m)    Wt 72.6 kg Comment: scale a  SpO2 92%   BMI 27.46 kg/m  , BMI Body mass index is 27.46 kg/m. GENERAL:  Well appearing HEENT: Pupils equal round and reactive, fundi not visualized, oral mucosa unremarkable NECK:  + Jugular venous distention, waveform within normal limits, carotid upstroke brisk and symmetric, no bruits, no thyromegaly LYMPHATICS:  No cervical adenopathy LUNGS:  Clear to auscultation bilaterally HEART:  RRR.  PMI not displaced or sustained,S1 and S2 within normal limits, no S3, no S4, no clicks, no rubs, no murmurs ABD:  Flat, positive bowel sounds normal in frequency in pitch, no bruits, no rebound, no guarding, no midline pulsatile mass, no hepatomegaly, no splenomegaly EXT:  2 plus pulses throughout, 1+ LE edema, no cyanosis no clubbing SKIN:  No rashes no nodules NEURO:  Cranial nerves II through XII grossly intact, motor grossly intact throughout [facility]:  Cognitively intact, oriented to person place and time   Labs    Chemistry Recent Labs  Lab 03/01/18 0434 03/02/18 0504  03/04/18 0411 03/05/18 0500 03/05/18 0858 03/06/18 0250  NA 141 142   < >  137 140  --  140  K 4.6 4.7   < > 5.3* 5.3*  --  4.8  CL 102 107   < > 100 100  --  100  CO2 28 25   < > 28 28  --  30  GLUCOSE 112* 119*   < > 158* 125*  --  121*  BUN 56* 59*   < > 61* 64*  --  64*  CREATININE 1.87* 2.27*   < > 2.22* 2.45*  --  2.45*  CALCIUM 8.5* 8.4*   < > 8.4* 8.3*  --  8.2*  PROT 6.0* 6.3*  --   --   --  5.8*  --   ALBUMIN 2.4* 2.6*  --   --   --  2.6* 2.4*  AST 30 38  --   --   --  29  --   ALT 15 20  --   --   --  20  --   ALKPHOS 34* 35*  --   --   --  36*  --   BILITOT 0.8 0.8  --   --   --  0.6  --   GFRNONAA 28* 22*   < > 22* 20*  --  20*  GFRAA 32* 25*   < > 26* 23*  --  23*  ANIONGAP 11 10   < > 9 12  --  10   < > = values in this interval not displayed.     Hematology Recent Labs  Lab 03/03/18 0622 03/04/18 0411 03/05/18 0500  WBC 3.5* 4.2 4.9  RBC 3.00* 2.95* 2.85*    HGB 9.1* 8.5* 8.3*  HCT 28.0* 27.2* 26.5*  MCV 93.3 92.2 93.0  MCH 30.3 28.8 29.1  MCHC 32.5 31.3 31.3  RDW 12.8 12.7 12.6  PLT 148* 151 159    Cardiac EnzymesNo results for input(s): TROPONINI in the last 168 hours. No results for input(s): TROPIPOC in the last 168 hours.   BNP Recent Labs  Lab 03/05/18 0500  BNP 1,123.8*     DDimer No results for input(s): DDIMER in the last 168 hours.   Radiology    Dg Chest 2 View  Result Date: 03/04/2018 CLINICAL DATA:  68 year old female with a history of shortness of breath EXAM: CHEST - 2 VIEW COMPARISON:  02/25/2018, 02/23/2018 01/20/2018 FINDINGS: Cardiomediastinal silhouette unchanged in size and contour. Increasing opacity at the right lung base with obscuration the right hemidiaphragm and right heart or. Blunting at the left costophrenic angle. Opacity in sulcus on the lateral view with thickening of the fissures. Interlobular septal thickening. No pneumothorax. IMPRESSION: Increasing opacities at the lung bases compatible with increasing pleural effusions with background of edema. Multifocal infection not excluded. Electronically Signed   By: Corrie Mckusick D.O.   On: 03/04/2018 19:32    Cardiac Studies   Echo 03/05/18: IMPRESSIONS   1. The left ventricle has has normal systolic function, with an ejection fraction of 55-60%. There is mildly increased left ventricular wall thickness. 2. The right ventricle has normal systolic function. The cavity was normal. 3. The mitral valve is normal in structure. There is mild thickening. There is mild mitral annular calcification present. 4. The tricuspid valve was normal in structure. 5. The aortic root is normal in size and structure. 6. Limited study for LV function; doppler not performed; normal LV systolic function.  Patient Profile     Ms. Andrea [name] is a 20F with CAD s/p LAD  PCI, chronic systolic and diastolic heart failure, hypertension, and diabetes here with volume overload  due to acute on chronic heart failure and acute renal failure.   Assessment & Plan    # Acute systolic and diastolic heart failure:  LVEF was 30 to 35% prior to her recent PCI 12/2017.  Echo was repeated yesterday and her systolic function has improved to 55 to 60%.  # AKI:  She has nephrotic range proteinuria and is undergoing a work-up per nephrology.  She had only 1 L of urine output and was net +250 mL's despite diuresis with IV Lasix.  Nephrology has taken the lead with diuretics and we will defer to them.  Renal function elevated despite holding Entresto and spironolactone. Last nephrology note recommended continuing losartan but this has been held.  Renal biopsy is being considered.  # Hypertension:  Blood pressure very poorly controlled.  We will increase her carvedilol to 12.5 mg and hydralazine to 100 mg 3 times daily.  Question of whether losartan should be on board as above.  # CAD:  S/s recent PCI. Medically managing residual disease.  Continue aspirin, clopidogrel, atorvastatin, and isosorbide.  Increase carvedilol as above.       For questions or updates, please contact [facility] Please consult [email or web address] for contact info under        Signed, Skeet Latch, MD  03/06/2018, 9:31 AM

## 2018-03-06 NOTE — Care Management Important Message (Signed)
Important Message  Patient Details  Name: Andrea [name] MRN: [id number] Date of Birth: 1950-03-13   Medicare Important Message Given:  Yes    Ahlana Slaydon P Jovian Lembcke 03/06/2018, 11:44 AM

## 2018-03-07 ENCOUNTER — Other Ambulatory Visit: Payer: Self-pay | Admitting: Cardiology

## 2018-03-07 LAB — RENAL FUNCTION PANEL
Albumin: 2.5 g/dL — ABNORMAL LOW (ref 3.5–5.0)
Anion gap: 11 (ref 5–15)
BUN: 68 mg/dL — ABNORMAL HIGH (ref 8–23)
CO2: 27 mmol/L (ref 22–32)
Calcium: 8.4 mg/dL — ABNORMAL LOW (ref 8.9–10.3)
Chloride: 100 mmol/L (ref 98–111)
Creatinine, Ser: 2.37 mg/dL — ABNORMAL HIGH (ref 0.44–1.00)
GFR calc Af Amer: 24 mL/min — ABNORMAL LOW (ref >60)
GFR calc non Af Amer: 21 mL/min — ABNORMAL LOW (ref >60)
GLUCOSE: 120 mg/dL — AB (ref 70–99)
PHOSPHORUS: 4.6 mg/dL (ref 2.5–4.6)
Potassium: 4.5 mmol/L (ref 3.5–5.1)
Sodium: 138 mmol/L (ref 135–145)

## 2018-03-07 LAB — GLUCOSE, CAPILLARY
Glucose-Capillary: 139 mg/dL — ABNORMAL HIGH (ref 70–99)
Glucose-Capillary: 129 mg/dL — ABNORMAL HIGH (ref 70–99)

## 2018-03-07 LAB — HEPATITIS B SURFACE ANTIBODY, QUANTITATIVE: Hep B S AB Quant (Post): <3.1 m[IU]/mL — ABNORMAL LOW (ref >9.9)

## 2018-03-07 LAB — HEPATITIS C ANTIBODY: HCV Ab: <0.1 s/co ratio (ref 0.0–0.9)

## 2018-03-07 LAB — HEPATITIS B SURFACE ANTIGEN: Hepatitis B Surface Ag: NEGATIVE

## 2018-03-07 LAB — C3 COMPLEMENT: C3 Complement: 83 mg/dL (ref 82–167)

## 2018-03-07 LAB — C4 COMPLEMENT: Complement C4, Body Fluid: 33 mg/dL (ref 14–44)

## 2018-03-07 MED ORDER — HYDRALAZINE HCL 100 MG PO TABS: 100.0000 mg | ORAL_TABLET | Freq: Three times a day (TID) | ORAL | 0 refills | Status: DC

## 2018-03-07 MED ORDER — FUROSEMIDE 40 MG PO TABS: 40.0000 mg | ORAL_TABLET | Freq: Every day | ORAL | 0 refills | Status: DC

## 2018-03-07 MED ORDER — CARVEDILOL 25 MG PO TABS: 25.0000 mg | ORAL_TABLET | Freq: Two times a day (BID) | ORAL | Status: DC

## 2018-03-07 MED ORDER — POLYETHYLENE GLYCOL 3350 17 G PO PACK: 17.0000 g | PACK | Freq: Every day | ORAL | 0 refills | Status: DC

## 2018-03-07 MED ORDER — ISOSORBIDE MONONITRATE ER 30 MG PO TB24: 30.0000 mg | ORAL_TABLET | Freq: Every day | ORAL | 0 refills | Status: AC

## 2018-03-07 NOTE — Progress Notes (Addendum)
Page to MD.   Andrea [name] pt does not qualify for home O2, pt insists she needs it at night when supine.

## 2018-03-07 NOTE — Progress Notes (Signed)
Patient does not qualify for home oxygen at this time, based on ambulating O2. Pt states she needs the oxygen at night when supine. pts night time vitals do not indicate a need, based on PM  02/13-02/14 O2 documented on Room Air, however patient's position was not noted. NT on staff was present that evening, denies pt use of O2 at that time.

## 2018-03-07 NOTE — Progress Notes (Signed)
Call placed to C[facility] to notify of telemetry monitoring d/c.   

## 2018-03-07 NOTE — Progress Notes (Signed)
Confirmed with CM Debbie that pt does not need to wait for [facility] to see her at bedside, they will contact her in the coming days to set up overnight pulse ox.

## 2018-03-07 NOTE — Progress Notes (Addendum)
Spoke w patient at bedside, she would like to use [facility] and [facility]. Notified Jermaine, clinical liaison. Orders in place, awaiting qualifying O2 sats. Patient states she has ride home. No other CM needs at this time.  14:00 Patient does not desaturate per bedside RN, does not qualify for home O2.

## 2018-03-07 NOTE — Progress Notes (Addendum)
Call placed to lab to request a phlebotomist return to draw patient's labs; no return attempt since unsuccessful attempt earlier today.

## 2018-03-07 NOTE — Progress Notes (Addendum)
[facility] Progress Note   Subjective:   Desperate to go home.  Feels breathing is improved.  Net neg 300 yesterday, 2L for the admission  Objective Vitals:   03/06/18 1753 03/06/18 1948 03/07/18 0457 03/07/18 0512  BP: (!) 150/66 (!) 137/47  (!) 140/49  Pulse:  77  75  Resp:  18  18  Temp:  98.2 F (36.8 C)  97.9 F (36.6 C)  TempSrc:  Oral  Oral  SpO2:  98%  94%  Weight:   72.8 kg   Height:       Physical Exam General: ambulating in the room Heart:  RRR, no rub or S4 Lungs: no rales, normal WOB Abdomen: soft, nontender Extremities: L foot edema unchanged o/w no edema  Additional Objective Labs: Basic Metabolic Panel: Recent Labs  Lab 03/04/18 0411 03/05/18 0500 03/06/18 0250  NA 137 140 140  K 5.3* 5.3* 4.8  CL 100 100 100  CO2 28 28 30   GLUCOSE 158* 125* 121*  BUN 61* 64* 64*  CREATININE 2.22* 2.45* 2.45*  CALCIUM 8.4* 8.3* 8.2*  PHOS  --   --  4.6   Liver Function Tests: Recent Labs  Lab 03/01/18 0434 03/02/18 0504 03/05/18 0858 03/06/18 0250  AST 30 38 29  --   ALT 15 20 20   --   ALKPHOS 34* 35* 36*  --   BILITOT 0.8 0.8 0.6  --   PROT 6.0* 6.3* 5.8*  --   ALBUMIN 2.4* 2.6* 2.6* 2.4*   No results for input(s): LIPASE, AMYLASE in the last 168 hours. CBC: Recent Labs  Lab 03/01/18 0434 03/02/18 0504 03/03/18 0622 03/04/18 0411 03/05/18 0500  WBC 4.1 4.1 3.5* 4.2 4.9  NEUTROABS  --   --   --   --  2.2  HGB 8.7* 8.9* 9.1* 8.5* 8.3*  HCT 27.3* 27.9* 28.0* 27.2* 26.5*  MCV 92.2 93.3 93.3 92.2 93.0  PLT 169 171 148* 151 159   Blood Culture    Component Value Date/Time   SDES URINE, CATHETERIZED 02/17/2017 1212   SPECREQUEST NONE 02/17/2017 1212   CULT  02/17/2017 1212    NO GROWTH Performed at [facility], [place] [address] [place], [place] [address]    REPTSTATUS 02/18/2017 FINAL 02/17/2017 1212    Cardiac Enzymes: No results for input(s): CKTOTAL, CKMB, CKMBINDEX, TROPONINI in the last 168  hours. CBG: Recent Labs  Lab 03/06/18 0726 03/06/18 1155 03/06/18 1613 03/06/18 2110 03/07/18 0740  GLUCAP 92 123* 150* 154* 139*   Iron Studies: No results for input(s): IRON, TIBC, TRANSFERRIN, FERRITIN in the last 72 hours. @lablastinr3 @ Studies/Results: No results found. Medications: . sodium chloride 150 mL/hr at 02/26/18 1616   . atorvastatin  80 mg Oral QHS  . carvedilol  12.5 mg Oral BID WC  . clopidogrel  75 mg Oral Daily  . divalproex  500 mg Oral QHS  . furosemide  80 mg Intravenous TID  . heparin  5,000 Units Subcutaneous Q8H  . hydrALAZINE  100 mg Oral Q8H  . insulin aspart  0-5 Units Subcutaneous QHS  . insulin aspart  0-9 Units Subcutaneous TID WC  . isosorbide mononitrate  30 mg Oral Daily  . lactulose  20 g Oral Daily  . levothyroxine  200 mcg Oral QAC breakfast  . morphine  15 mg Oral BID  . polyethylene glycol  17 g Oral Daily  . pregabalin  75 mg Oral Daily  . Vitamin D (Ergocalciferol)  50,000  Units Oral Q7 days     Assessment/Plan: ** AKI on CKD:  Creatinine normal until 12/2017 and since then has been 1.5-2 and this admission into the mid 2s.  This trend up has been in the setting of presentation in 12/2017 for CHF eval with LHC showing 3 vessel disease, EF 30-35%.  Treated with PCI to LAD.  She's had several subsequent admissions for dyspnea and has been treated with a variety of meds including aldactone, entresto, lasix, losartan outpatient and has been diuresed inpatient.    During this admission a repeat TTE has shown normal EF (IVC was note commented upon).  CXR initially with mild vasc congestion f/u 2/12 showed small effusions, worse infiltrates possibly edema.    Imaging (CT 2/9)  with L pelvic kidney but no other issues.   She has proteinuria (see below) but not nephrotic syndrome and I don't think this is a primary nephrologic issue.    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    Renal function has been stable in the past few days.  She feels improved.  I think discharge is reasonable.  I will f/u with her in clinic in 2-3 weeks.  Hold ARB for now.    **Proteinuria:  UP/C 02/2018 5.04 - proteinuria on dipstick for > 1 year, hasn't been quantified until this admission.  Lipids inconsistent with nephrotic syndrome.  HIV, RPR, complements normal, HB and HCV serologies neg.  SPEP nl. SFLC c/w CKD. UPEP pending.  AntiPLA2R pending.   In light of need for ongoing antiplt therapy after recent PCI a renal biopsy would not be a trivial matter. This is likely diabetic nephropathy but if serologic w/u shows any abnormalities may consider a renal biopsy further.  At this time will not plan for renal biopsy.   ** Acute on chronic HFrEF:  Now with normal EF.  On coreg 25 BID (increased today 2/15), hydralazine 100 TID, ISMN 30 daily.  At home was on lasix 20 daily - would discharge on lasix 40 po daily with daily weights.  Would hold RAAS inhibition for now pending stabilization of renal function.    **CAD:  Plavix s/p recent PCI  **ANemia:  Normocytic, iron sat 2/5 15%, ferritin 125.    Jannifer Hick MD 03/07/2018, 9:05 AM  [facility] Pager: [phone]

## 2018-03-07 NOTE — Progress Notes (Signed)
   03/07/18 1100  Urine Characteristics  Time patient last voided or urinary catheter emptied 1115  Urinary Interventions Bladder scan  Bladder Scan Volume (mL) 445 mL   Pt with frequent urination, encouraged pt to attempt to void in the next 10-15 minutes, if unable to we can initiate urinary retention protocol.

## 2018-03-07 NOTE — Progress Notes (Signed)
Progress Note  Patient Name: Andrea [name] Date of Encounter: 03/07/2018  Primary Cardiologist: Kirk Ruths, MD   Subjective   Feeling well.  No recurrent nausea this morning.  Breathing is stable.  She wants to go home.  Inpatient Medications    Scheduled Meds: . atorvastatin  80 mg Oral QHS  . carvedilol  12.5 mg Oral BID WC  . clopidogrel  75 mg Oral Daily  . divalproex  500 mg Oral QHS  . furosemide  80 mg Intravenous TID  . heparin  5,000 Units Subcutaneous Q8H  . hydrALAZINE  100 mg Oral Q8H  . insulin aspart  0-5 Units Subcutaneous QHS  . insulin aspart  0-9 Units Subcutaneous TID WC  . isosorbide mononitrate  30 mg Oral Daily  . lactulose  20 g Oral Daily  . levothyroxine  200 mcg Oral QAC breakfast  . morphine  15 mg Oral BID  . polyethylene glycol  17 g Oral Daily  . pregabalin  75 mg Oral Daily  . Vitamin D (Ergocalciferol)  50,000 Units Oral Q7 days   Continuous Infusions: . sodium chloride 150 mL/hr at 02/26/18 1616   PRN Meds: sodium chloride, ALPRAZolam, hydrALAZINE, ondansetron, oxyCODONE-acetaminophen **AND** oxyCODONE, senna-docusate   Vital Signs    Vitals:   03/06/18 1753 03/06/18 1948 03/07/18 0457 03/07/18 0512  BP: (!) 150/66 (!) 137/47  (!) 140/49  Pulse:  77  75  Resp:  18  18  Temp:  98.2 F (36.8 C)  97.9 F (36.6 C)  TempSrc:  Oral  Oral  SpO2:  98%  94%  Weight:   72.8 kg   Height:        Intake/Output Summary (Last 24 hours) at 03/07/2018 0854 Last data filed at 03/07/2018 0715 Gross per 24 hour  Intake 845 ml  Output 1051 ml  Net -206 ml   Last 3 Weights 03/07/2018 03/06/2018 03/05/2018  Weight (lbs) 160 lb 9.6 oz 160 lb 161 lb 3.2 oz  Weight (kg) 72.848 kg 72.576 kg 73.12 kg      Telemetry    Sinus rhythm.  No events - Personally Reviewed  ECG    N/a - Personally Reviewed  Physical Exam   VS:  BP (!) 140/49 (BP Location: Right Arm)   Pulse 75   Temp 97.9 F (36.6 C) (Oral)   Resp 18   Ht 5\' 4"   (1.626 m)   Wt 72.8 kg   SpO2 94%   BMI 27.57 kg/m  , BMI Body mass index is 27.57 kg/m. GENERAL:  Well appearing HEENT: Pupils equal round and reactive, fundi not visualized, oral mucosa unremarkable NECK:  No jugular venous distention sitting upright, waveform within normal limits, carotid upstroke brisk and symmetric, no bruits, no thyromegaly LYMPHATICS:  No cervical adenopathy LUNGS:  Clear to auscultation bilaterally HEART:  RRR.  PMI not displaced or sustained,S1 and S2 within normal limits, no S3, no S4, no clicks, no rubs, no murmurs ABD:  Flat, positive bowel sounds normal in frequency in pitch, no bruits, no rebound, no guarding, no midline pulsatile mass, no hepatomegaly, no splenomegaly EXT:  2 plus pulses throughout, no edema, no cyanosis no clubbing SKIN:  No rashes no nodules NEURO:  Cranial nerves II through XII grossly intact, motor grossly intact throughout [facility]:  Cognitively intact, oriented to person place and time   [place]  Lab 03/01/18 0434 03/02/18 3818  03/04/18 0411 03/05/18 0500 03/05/18 2993 03/06/18 0250  NA 141 142   < > 137 140  --  140  K 4.6 4.7   < > 5.3* 5.3*  --  4.8  CL 102 107   < > 100 100  --  100  CO2 28 25   < > 28 28  --  30  GLUCOSE 112* 119*   < > 158* 125*  --  121*  BUN 56* 59*   < > 61* 64*  --  64*  CREATININE 1.87* 2.27*   < > 2.22* 2.45*  --  2.45*  CALCIUM 8.5* 8.4*   < > 8.4* 8.3*  --  8.2*  PROT 6.0* 6.3*  --   --   --  5.8*  --   ALBUMIN 2.4* 2.6*  --   --   --  2.6* 2.4*  AST 30 38  --   --   --  29  --   ALT 15 20  --   --   --  20  --   ALKPHOS 34* 35*  --   --   --  36*  --   BILITOT 0.8 0.8  --   --   --  0.6  --   GFRNONAA 28* 22*   < > 22* 20*  --  20*  GFRAA 32* 25*   < > 26* 23*  --  23*  ANIONGAP 11 10   < > 9 12  --  10   < > = values in this interval not displayed.     Hematology Recent Labs  Lab 03/03/18 0622 03/04/18 0411 03/05/18 0500  WBC 3.5* 4.2 4.9  RBC 3.00*  2.95* 2.85*  HGB 9.1* 8.5* 8.3*  HCT 28.0* 27.2* 26.5*  MCV 93.3 92.2 93.0  MCH 30.3 28.8 29.1  MCHC 32.5 31.3 31.3  RDW 12.8 12.7 12.6  PLT 148* 151 159    Cardiac EnzymesNo results for input(s): TROPONINI in the last 168 hours. No results for input(s): TROPIPOC in the last 168 hours.   BNP Recent Labs  Lab 03/05/18 0500  BNP 1,123.8*     DDimer No results for input(s): DDIMER in the last 168 hours.   Radiology    No results found.  Cardiac Studies   Echo 03/05/18: IMPRESSIONS  1. The left ventricle has has normal systolic function, with an ejection fraction of 55-60%. There is mildly increased left ventricular wall thickness. 2. The right ventricle has normal systolic function. The cavity was normal. 3. The mitral valve is normal in structure. There is mild thickening. There is mild mitral annular calcification present. 4. The tricuspid valve was normal in structure. 5. The aortic root is normal in size and structure. 6. Limited study for LV function; doppler not performed; normal LV systolic function.  LHC 12/30/17:  Prox LAD lesion is 90% stenosed.  A drug-eluting stent was successfully placed using a STENT SYNERGY DES 3X38.  Post intervention, there is 0% residual stenosis.  Dist LAD lesion is 90% stenosed.  A drug-eluting stent was successfully placed using a STENT SYNERGY DES 2.5X16.  Post intervention, there is a 0% residual stenosis.   1. Successful PCI of the proximal and distal LAD with DES x 2.  Plan: since patient is ASA allergic I would recommend Brilinta monotherapy 90 mg bid for one year then 60 mg bid long term. Patient is a candidate for discharge from a cardiac standpoint tomorrow. Her residual CAD will be treated medically.   Patient Profile  Andrea [name] is a 39F with CAD s/p LAD PCI, chronic systolic and diastolic heart failure, hypertension, and diabetes here with volume overload due to acute on chronic heart failure and acute  renal failure.   Assessment & Plan    # Acute systolic and diastolic heart failure:  LVEF was 30 to 35% prior to her recent PCI 12/2017.  Echo was repeated this admission and her systolic function has improved to 55 to 60%.  # CAD s/p recent PCI: LAD PCI 12/2017.  Continue carvedilol, atorvastatin, Plavix, Imdur.  # AKI:  Renal function panel pending.  They were unable to draw labs this AM.  She has nephrotic range proteinuria and is undergoing a work-up per nephrology.  This is thought to be due to DM and not nephrotic syndrome.   Net -377mL with IV lasix and metolazone.  Holding Entreso and spironolactone.  No plan for biopsy to avoid holding P2Y12 given her recent stent.   # Hypertension:  Blood pressure control is better but not at goal.  Will increase carvedilol.  Given that her systolic function has improved we can also add amlodipine if needed.  Continue hydralazine.         For questions or updates, please contact [place] Please consult [email or web address] for contact info under        Signed, Skeet Latch, MD  03/07/2018, 8:54 AM

## 2018-03-07 NOTE — Progress Notes (Signed)
Patient resting comfortably during shift report. Denies complaints.  

## 2018-03-09 DIAGNOSIS — N183 Chronic kidney disease, stage 3 (moderate): Secondary | ICD-10-CM

## 2018-03-09 DIAGNOSIS — I129 Hypertensive chronic kidney disease with stage 1 through stage 4 chronic kidney disease, or unspecified chronic kidney disease: Secondary | ICD-10-CM | POA: Insufficient documentation

## 2018-03-09 LAB — UPEP/UIFE/LIGHT CHAINS/TP, 24-HR UR
% BETA, Urine: 8.3 %
ALPHA 1 URINE: 0.9 %
Albumin, U: 73.5 %
Alpha 2, Urine: 4 %
Free Kappa Lt Chains,Ur: 134.99 mg/L — ABNORMAL HIGH (ref 0.63–113.79)
Free Kappa/Lambda Ratio: 3.52 (ref 1.03–31.76)
Free Lambda Lt Chains,Ur: 38.3 mg/L — ABNORMAL HIGH (ref 0.47–11.77)
GAMMA GLOBULIN URINE: 13.4 %
TOTAL PROTEIN, URINE-UR/DAY: 4329 mg/(24.h) — AB (ref 30–150)
Total Protein, Urine: 618.4 mg/dL
Total Volume: 700

## 2018-03-11 ENCOUNTER — Encounter: Payer: Self-pay | Admitting: Cardiology

## 2018-03-11 NOTE — Discharge Summary (Signed)
[facility] Discharge Summary   Patient: Andrea [name] OYD:[id number]   PCP: [facility] DOB: 1950/10/15   Date of admission: 02/23/2018   Date of discharge: 03/07/2018     Discharge Diagnoses:  Principal diagnosis Acute on chronic combined CHF Active Problems:   Hypertension   Insulin dependent diabetes mellitus ([place])   Chronic pain   Acute CHF (congestive heart failure) (HCC)   CAD S/P percutaneous coronary angioplasty   High risk medication use   Acute respiratory failure with hypoxia (HCC)   CHF (congestive heart failure) ([place])   Anemia in other chronic diseases classified elsewhere   Admitted From: Home Disposition: Home with home health  Recommendations for Outpatient Follow-up:  1. Please follow-up with PCP cardiology as well as nephrology as recommended  [facility] Follow up.   Why:  They will do your home health care at your home Contact information: [address] [place] [address] [phone]        [facility]. Schedule an appointment as soon as possible for a visit in 1 week(s).   Specialty:  Family Medicine Contact information: [place] [address] [phone]        Lelon Perla, MD .   Specialty:  Cardiology Contact information: [address] [place] [address] [phone]          Diet recommendation: Cardiac diet  Activity: The patient is advised to gradually reintroduce usual activities.  Discharge Condition: good  Code Status: Full code  History of present illness: As per the H and P dictated on admission, "Patient is a 68 year old female with history of PCI x 2 towards the end of December 2019.  Other prior documented medical history includes hypothyroidism, hypertension, diabetes mellitus, esophageal reflux, migraine, seizure and breast cancer.  Patient's problem  started about 3 days prior to admission with what appeared to be a viral-like illness.  Apparently, patient works as a Oceanographer (teaches grade 1 pupil).  Patient developed low-grade fever, nausea and vomiting while at school 3 days ago.  Patient reported temperature of about 100 F.  Patient developed shortness of breath the following day, with orthopnea and dyspnea on exertion, without obvious edema.  Patient also reported chest pain.  According to the patient, earlier today, she felt as if there was an elephant sitting on her chest.  No associated diaphoresis, no radiation of the chest pain.  On presentation to the hospital, the patient's blood pressure was significantly elevated, with systolic blood pressure greater than 185 mmHg.  Cardiac BNP was elevated at 1296.  Potassium is 5.5, with a BUN of 52 and serum creatinine of 1.78.  Serum creatinine was 1.34 about 3 weeks ago.  First troponin is less than 0.03.  Hemoglobin is 8.2.  Chest X-ray revealed findings that were concerning for pulmonary edema.  EKG revealed poor R wave progression."  Hospital Course:  Summary of her active problems in the hospital is as following. Acute on chronic systolic CHF -Last echo with EF of 30 to 35%, repeat echo this admission 03/05/2018 shows improvement in EF 55 to 60%. -Initially improved with diuresis however had significant worsening of creatinine to up to 2.5 range, hence diuretics were held, Delene Loll was discontinued on admission due to worsening renal failure -Also received 1 unit of PRBC and 40 mg of IV Lasix -Subsequently was felt to be euvolemic and restarted  on diuretics -O2 weaned down  -Due to wide fluctuations in creatinine with diuretics nephrology consulted -IV Lasix restarted 2/10, due to suboptimal diuresis Lasix dose increased. -Chest x-ray looks worse than before. Cardiology consult for further assistance.  History of multivessel CAD -History of proximal and distal LAD stent on  12/10, -Has residual diagonal and OM1 stenosis, not felt to be appropriate for CABG based on CVTS assessment in December -Continue Plavix, Coreg, statin, allergy list indicates that she is allergic to aspirin on Imdur for persistent chest pain, which has improved her symptoms to some extent Continue Plavix. Unable to tolerate Brilinta or Effient.  Acute kidney injury Hyperkalemia -Baseline creatinine was 1.0, approximately 2 months ago, then worsened likely from cardiorenal syndrome worsened with recent Entresto, diuretics -Creatinine had improved last admission from 2 range to 1.3 at discharge by holding her Entresto, diuretics, at discharge on 1/2 was restarted on Entresto, diuretics were discontinued -On admission creatinine was around 1.7, started diuretics, worsened to 1.9 and then 2.5 -held IV Lasix then, was restarted on oral diuretics creatinine was trending down to the 1.8 range yesterday, then up to 2.2, -Nephrology input requested due to wide fluctuations in kidney function, cardiorenal syndrome -Entresto and losartan currently on hold. -Does not have nephrotic syndrome -No biopsy or further work-up planned for now.  Diuresis is the treatment recommended at present.  Anemia due to chronic disease and iron deficiency -Likely multifactorial, volume overloaded state could be contributing to some degree of hemodilution -In addition being on antiplatelets increases risk of bleeding, patient denies any overt blood loss -Anemia panel with iron deficiency, given 1 unit of PRBC and IV iron  -History of colonoscopy 5 years ago which was normal per patient report, no history of melena or hematochezia -Will need oral iron at discharge  Intermittent vomiting -Mild, appears to have improved  -Likely medication related, decreased dose of Depakote and long-acting morphine  -CT abdomen pelvis was unremarkable, LFTs were benign -Supportive care  Chronic pain -On high-dose morphine and  Lyrica -Renally dose Lyrica -deccreased morphine dose as above  Mild cognitive dysfunction -Higher functions limited likely secondary to high-dose narcotics and Lyrica  Type 2 diabetes mellitus, controlled. -CBGs stable without Lantus which she takes at home, continue sliding scale  Hypothyroidism. Continue Synthroid. TSH and free T4 stable.  Patient was seen by physical therapy, who recommended home health, which was arranged by case manager. On the day of the discharge the patient's vitals were stable, and no other acute medical condition were reported by patient. the patient was felt safe to be discharge at home with home health.  Consultants: cardiology, nephrology Procedures: Echocardiogram   DISCHARGE MEDICATION: Allergies as of 03/07/2018      Reactions   Ticagrelor Rash   Aspartame And Phenylalanine Hives, Diarrhea   Aspirin Nausea And Vomiting   Maxzide [hydrochlorothiazide W-triamterene] Swelling   Fluid retention   Metformin And Related Diarrhea   Nsaids Nausea And Vomiting   Other Other (See Comments)   All steroids produce psychosis per pt Artificial sweeteners produce nausea and upset stomach.   Pravachol [pravastatin] Other (See Comments)   unknown   Spironolactone    Stadol [butorphanol] Other (See Comments)   Toradol, etc And related- hallucinations   Toradol [ketorolac Tromethamine] Other (See Comments)   hallucinations   Vistaril [hydroxyzine Hcl] Other (See Comments)   unknown   Erythromycin Itching, Rash   Morphine And Related Hives, Rash   Iv morphine.   Penicillins Hives, Itching, Rash  Has patient had a PCN reaction causing immediate rash, facial/tongue/throat swelling, SOB or lightheadedness with hypotension: yes Has patient had a PCN reaction causing severe rash involving mucus membranes or skin necrosis: unknown Has patient had a PCN reaction that required hospitalization : unknown Has patient had a PCN reaction occurring within the last  10 years: no If all of the above answers are "NO", then may proceed with Cephalosporin use.      Medication List    STOP taking these medications   furosemide 20 MG tablet Commonly known as:  LASIX   LANTUS SOLOSTAR 100 UNIT/ML Solostar Pen Generic drug:  Insulin Glargine   sacubitril-valsartan 24-26 MG Commonly known as:  ENTRESTO     TAKE these medications   atorvastatin 80 MG tablet Commonly known as:  LIPITOR Take 1 tablet (80 mg total) by mouth at bedtime.   carvedilol 6.25 MG tablet Commonly known as:  COREG Take 1 tablet (6.25 mg total) by mouth 2 (two) times daily with a meal.   clopidogrel 75 MG tablet Commonly known as:  PLAVIX Take 1 tablet (75 mg total) by mouth daily.   divalproex 250 MG DR tablet Commonly known as:  DEPAKOTE Take 1,000 mg by mouth at bedtime.   ELDERBERRY PO Take 1 Dose by mouth daily.   hydrALAZINE 100 MG tablet Commonly known as:  APRESOLINE Take 1 tablet (100 mg total) by mouth every 8 (eight) hours.   isosorbide mononitrate 30 MG 24 hr tablet Commonly known as:  IMDUR Take 1 tablet (30 mg total) by mouth daily.   levothyroxine 200 MCG tablet Commonly known as:  SYNTHROID, LEVOTHROID Take 1 tablet (200 mcg total) by mouth at bedtime. What changed:  when to take this   morphine 30 MG 24 hr capsule Commonly known as:  KADIAN Take 30 mg by mouth 2 (two) times daily.   oxyCODONE-acetaminophen 10-325 MG tablet Commonly known as:  PERCOCET Take 1 tablet by mouth 3 (three) times daily as needed for pain.   polyethylene glycol packet Commonly known as:  MIRALAX / GLYCOLAX Take 17 g by mouth daily.   pregabalin 75 MG capsule Commonly known as:  LYRICA Take 1 capsule (75 mg total) 2 (two) times daily by mouth.   promethazine 25 MG tablet Commonly known as:  PHENERGAN Take 25 mg by mouth every 6 (six) hours as needed for nausea.   senna-docusate 8.6-50 MG tablet Commonly known as:  Senokot-S Take 2 tablets by mouth 2 (two)  times daily. What changed:    when to take this  reasons to take this   Vitamin D (Ergocalciferol) 1.25 MG (50000 UT) Caps capsule Commonly known as:  DRISDOL Take 50,000 Units by mouth every 7 (seven) days.      Allergies  Allergen Reactions  . Ticagrelor Rash  . Aspartame And Phenylalanine Hives and Diarrhea  . Aspirin Nausea And Vomiting  . Maxzide [Hydrochlorothiazide W-Triamterene] Swelling    Fluid retention  . Metformin And Related Diarrhea  . Nsaids Nausea And Vomiting  . Other Other (See Comments)    All steroids produce psychosis per pt Artificial sweeteners produce nausea and upset stomach.  Gregary Cromer [Pravastatin] Other (See Comments)    unknown  . Spironolactone   . Stadol [Butorphanol] Other (See Comments)    Toradol, etc And related- hallucinations  . Toradol [Ketorolac Tromethamine] Other (See Comments)    hallucinations  . Vistaril [Hydroxyzine Hcl] Other (See Comments)    unknown  . Erythromycin Itching and  Rash  . Morphine And Related Hives and Rash    Iv morphine.  . Penicillins Hives, Itching and Rash    Has patient had a PCN reaction causing immediate rash, facial/tongue/throat swelling, SOB or lightheadedness with hypotension: yes Has patient had a PCN reaction causing severe rash involving mucus membranes or skin necrosis: unknown Has patient had a PCN reaction that required hospitalization : unknown Has patient had a PCN reaction occurring within the last 10 years: no If all of the above answers are "NO", then may proceed with Cephalosporin use.    Discharge Instructions    Diet - low sodium heart healthy   Complete by:  As directed    Discharge instructions   Complete by:  As directed    It is important that you read the given instructions as well as go over your medication list with RN to help you understand your care after this hospitalization.  Discharge Instructions: Please follow-up with PCP in 1-2 weeks  Please request your  primary care physician to go over all Hospital Tests and Procedure/Radiological results at the follow up. Please get all Hospital records sent to your PCP by signing hospital release before you go home.   Do not take more than prescribed Pain, Sleep and Anxiety Medications. You were cared for by a hospitalist during your hospital stay. If you have any questions about your discharge medications or the care you received while you were in the hospital after you are discharged, you can call the unit @UNIT @ you were admitted to and ask to speak with the hospitalist on call if the hospitalist that took care of you is not available.  Once you are discharged, your primary care physician will handle any further medical issues. Please note that NO REFILLS for any discharge medications will be authorized once you are discharged, as it is imperative that you return to your primary care physician (or establish a relationship with a primary care physician if you do not have one) for your aftercare needs so that they can reassess your need for medications and monitor your lab values. You Must read complete instructions/literature along with all the possible adverse reactions/side effects for all the Medicines you take and that have been prescribed to you. Take any new Medicines after you have completely understood and accept all the possible adverse reactions/side effects. Wear Seat belts while driving. If you have smoked or chewed Tobacco in the last 2 yrs please stop smoking and/or stop any Recreational drug use.  If you drink alcohol, please moderate the use and do not drive, operating heavy machinery, perform activities at heights, swimming or participation in water activities or provide baby sitting services under influence.   Increase activity slowly   Complete by:  As directed    Pulse oximetry, overnight   Complete by:  As directed      Discharge Exam: Filed Weights   03/05/18 0326 03/06/18 0326 03/07/18  0457  Weight: 73.1 kg 72.6 kg 72.8 kg   Vitals:   03/07/18 1220 03/07/18 1350  BP: (!) 144/66   Pulse: 76   Resp: 20   Temp: 97.9 F (36.6 C)   SpO2: 98% 90%   General: Appear in mild distress, no Rash; Oral Mucosa moist. Cardiovascular: S1 and S2 Present, no Murmur, no JVD Respiratory: Bilateral Air entry present and Clear to Auscultation, no Crackles, Occasional wheezes Abdomen: Bowel Sound present, Soft and no tenderness Extremities: no Pedal edema, no calf tenderness Neurology: Grossly no focal  neuro deficit.  The results of significant diagnostics from this hospitalization (including imaging, microbiology, ancillary and laboratory) are listed below for reference.    Significant Diagnostic Studies: Ct Abdomen Pelvis Wo Contrast  Result Date: 03/01/2018 CLINICAL DATA:  68 year old female with history of intermittent vomiting. EXAM: CT ABDOMEN AND PELVIS WITHOUT CONTRAST TECHNIQUE: Multidetector CT imaging of the abdomen and pelvis was performed following the standard protocol without IV contrast. COMPARISON:  CT the abdomen and pelvis 06/01/2017. FINDINGS: Lower chest: Bilateral breast implants are incidentally noted. Atherosclerotic calcifications in the descending thoracic aorta as well as the left anterior descending and right coronary arteries. Small bilateral pleural effusions lying dependently. This is associated with some passive atelectasis in the lower lobes of the lungs bilaterally. Septal thickening throughout the visualize lung bases concerning for a background of mild interstitial pulmonary edema. Hepatobiliary: No definite suspicious cystic or solid hepatic lesions are confidently identified on today's noncontrast CT examination. Status post cholecystectomy. Pancreas: No definite pancreatic mass or peripancreatic fluid or inflammatory changes are noted on today's noncontrast CT examination. Spleen: Unremarkable. Adrenals/Urinary Tract: Left-sided pelvic kidney. No calculi  identified within the collecting system of either kidney, along the course of either ureter or within the lumen of the urinary bladder. No hydroureteronephrosis. Urinary bladder is normal in appearance. Stomach/Bowel: Unenhanced appearance of the stomach is normal. No pathologic dilatation of small bowel or colon. The appendix is not confidently identified and may be surgically absent. Regardless, there are no inflammatory changes noted adjacent to the cecum to suggest the presence of an acute appendicitis at this time. Vascular/Lymphatic: Aortic atherosclerosis. No lymphadenopathy noted in the abdomen or pelvis. Reproductive: Status post hysterectomy. Ovaries are not confidently identified may be surgically absent or atrophic. Other: No significant volume of ascites.  No pneumoperitoneum. Musculoskeletal: There are no aggressive appearing lytic or blastic lesions noted in the visualized portions of the skeleton. IMPRESSION: 1. No acute findings are noted in the abdomen or pelvis to account for the patient's symptoms. 2. The appearance of the lung bases suggests interstitial pulmonary edema with small bilateral pleural effusions. 3. Aortic atherosclerosis, in addition to least 2 vessel coronary artery disease. Please note that although the presence of coronary artery calcium documents the presence of coronary artery disease, the severity of this disease and any potential stenosis cannot be assessed on this non-gated CT examination. Assessment for potential risk factor modification, dietary therapy or pharmacologic therapy may be warranted, if clinically indicated. 4. Additional incidental findings, as above. Electronically Signed   By: Vinnie Langton M.D.   On: 03/01/2018 16:24   Dg Chest 2 View  Result Date: 03/04/2018 CLINICAL DATA:  68 year old female with a history of shortness of breath EXAM: CHEST - 2 VIEW COMPARISON:  02/25/2018, 02/23/2018 01/20/2018 FINDINGS: Cardiomediastinal silhouette unchanged in  size and contour. Increasing opacity at the right lung base with obscuration the right hemidiaphragm and right heart or. Blunting at the left costophrenic angle. Opacity in sulcus on the lateral view with thickening of the fissures. Interlobular septal thickening. No pneumothorax. IMPRESSION: Increasing opacities at the lung bases compatible with increasing pleural effusions with background of edema. Multifocal infection not excluded. Electronically Signed   By: Corrie Mckusick D.O.   On: 03/04/2018 19:32   Dg Chest 2 View  Result Date: 02/25/2018 CLINICAL DATA:  Hypoxia. EXAM: CHEST - 2 VIEW COMPARISON:  December 24, 2018 FINDINGS: No pneumothorax. The heart size is borderline. The hila and mediastinum are unremarkable. Small bilateral pleural effusions. No nodules, masses,  or suspicious infiltrates. IMPRESSION: Small bilateral pleural effusions. Possible mild pulmonary venous congestion. No overt edema. Electronically Signed   By: Dorise Bullion III M.D   On: 02/25/2018 11:18   Dg Chest 2 View  Result Date: 02/23/2018 CLINICAL DATA:  Worsening shortness of breath, worse when lying down. History of CHF. EXAM: CHEST - 2 VIEW COMPARISON:  Chest radiograph January 20, 2018 FINDINGS: Cardiac silhouette is normal in size. Mediastinal silhouette is not suspicious increased interstitial prominence with small pleural effusion most apparent posteriorly. No pneumothorax. Soft tissue planes and included osseous structures are non suspicious. Bilateral breast implants Surgical clips in the included right abdomen compatible with cholecystectomy. Mild approximate T10 compression fracture. IMPRESSION: 1. Increased interstitial prominence with small pleural effusion concerning for pulmonary edema. Electronically Signed   By: Elon Alas M.D.   On: 02/23/2018 05:45    Microbiology: No results found for this or any previous visit (from the past 240 hour(s)).   Labs: CBC: Recent Labs  Lab 03/04/18 0411  03/05/18 0500  WBC 4.2 4.9  NEUTROABS  --  2.2  HGB 8.5* 8.3*  HCT 27.2* 26.5*  MCV 92.2 93.0  PLT 151 657   Basic Metabolic Panel: Recent Labs  Lab 03/04/18 0411 03/05/18 0500 03/06/18 0250 03/07/18 1047  NA 137 140 140 138  K 5.3* 5.3* 4.8 4.5  CL 100 100 100 100  CO2 28 28 30 27   GLUCOSE 158* 125* 121* 120*  BUN 61* 64* 64* 68*  CREATININE 2.22* 2.45* 2.45* 2.37*  CALCIUM 8.4* 8.3* 8.2* 8.4*  MG  --  1.7  --   --   PHOS  --   --  4.6 4.6   Liver Function Tests: Recent Labs  Lab 03/05/18 0858 03/06/18 0250 03/07/18 1047  AST 29  --   --   ALT 20  --   --   ALKPHOS 36*  --   --   BILITOT 0.6  --   --   PROT 5.8*  --   --   ALBUMIN 2.6* 2.4* 2.5*   No results for input(s): LIPASE, AMYLASE in the last 168 hours. No results for input(s): AMMONIA in the last 168 hours. Cardiac Enzymes: No results for input(s): CKTOTAL, CKMB, CKMBINDEX, TROPONINI in the last 168 hours. BNP (last 3 results) Recent Labs    01/17/18 1715 02/23/18 0513 03/05/18 0500  BNP 267.6* 1,296.3* 1,123.8*   CBG: Recent Labs  Lab 03/06/18 1155 03/06/18 1613 03/06/18 2110 03/07/18 0740 03/07/18 1217  GLUCAP 123* 150* 154* 139* 129*   Time spent: 35 minutes  Signed:  Berle Mull  [facility] 03/07/2018

## 2018-03-12 ENCOUNTER — Telehealth: Payer: Self-pay | Admitting: Cardiology

## 2018-03-12 LAB — MISC LABCORP TEST (SEND OUT): Labcorp test code: 141330

## 2018-03-12 NOTE — Telephone Encounter (Signed)
New Message    Gerald Stabs from [facility] needs orders placed Home Health Physical therapy:  Once a week for first week 2 times a week for second week Once a week for 2wks   Phone: [phone] Fax: N/A

## 2018-03-12 NOTE — Telephone Encounter (Signed)
Should come from primary care Kirk Ruths

## 2018-03-12 NOTE — Telephone Encounter (Signed)
Please advise. Thank you

## 2018-03-15 ENCOUNTER — Emergency Department (HOSPITAL_COMMUNITY): Payer: Medicare Other

## 2018-03-15 ENCOUNTER — Inpatient Hospital Stay (HOSPITAL_COMMUNITY)
Admission: EM | Admit: 2018-03-15 | Discharge: 2018-03-27 | DRG: 291 | Disposition: A | Discharge status: Skilled Nursing Facility | Payer: Medicare Other | Attending: Internal Medicine | Admitting: Internal Medicine

## 2018-03-15 ENCOUNTER — Encounter (HOSPITAL_COMMUNITY): Payer: Self-pay

## 2018-03-15 ENCOUNTER — Other Ambulatory Visit: Payer: Self-pay

## 2018-03-15 DIAGNOSIS — Z7902 Long term (current) use of antithrombotics/antiplatelets: Secondary | ICD-10-CM

## 2018-03-15 DIAGNOSIS — R1110 Vomiting, unspecified: Secondary | ICD-10-CM

## 2018-03-15 DIAGNOSIS — E119 Type 2 diabetes mellitus without complications: Secondary | ICD-10-CM

## 2018-03-15 DIAGNOSIS — E875 Hyperkalemia: Secondary | ICD-10-CM | POA: Diagnosis not present

## 2018-03-15 DIAGNOSIS — F411 Generalized anxiety disorder: Secondary | ICD-10-CM | POA: Diagnosis present

## 2018-03-15 DIAGNOSIS — Z79891 Long term (current) use of opiate analgesic: Secondary | ICD-10-CM

## 2018-03-15 DIAGNOSIS — Z87898 Personal history of other specified conditions: Secondary | ICD-10-CM

## 2018-03-15 DIAGNOSIS — E1165 Type 2 diabetes mellitus with hyperglycemia: Secondary | ICD-10-CM

## 2018-03-15 DIAGNOSIS — E860 Dehydration: Secondary | ICD-10-CM | POA: Diagnosis present

## 2018-03-15 DIAGNOSIS — E870 Hyperosmolality and hypernatremia: Secondary | ICD-10-CM | POA: Diagnosis not present

## 2018-03-15 DIAGNOSIS — N179 Acute kidney failure, unspecified: Secondary | ICD-10-CM | POA: Diagnosis present

## 2018-03-15 DIAGNOSIS — M1712 Unilateral primary osteoarthritis, left knee: Secondary | ICD-10-CM | POA: Diagnosis present

## 2018-03-15 DIAGNOSIS — T402X5A Adverse effect of other opioids, initial encounter: Secondary | ICD-10-CM | POA: Diagnosis present

## 2018-03-15 DIAGNOSIS — G894 Chronic pain syndrome: Secondary | ICD-10-CM | POA: Diagnosis present

## 2018-03-15 DIAGNOSIS — I5041 Acute combined systolic (congestive) and diastolic (congestive) heart failure: Secondary | ICD-10-CM | POA: Diagnosis not present

## 2018-03-15 DIAGNOSIS — I2510 Atherosclerotic heart disease of native coronary artery without angina pectoris: Secondary | ICD-10-CM

## 2018-03-15 DIAGNOSIS — IMO0001 Reserved for inherently not codable concepts without codable children: Secondary | ICD-10-CM

## 2018-03-15 DIAGNOSIS — I509 Heart failure, unspecified: Secondary | ICD-10-CM

## 2018-03-15 DIAGNOSIS — I5033 Acute on chronic diastolic (congestive) heart failure: Secondary | ICD-10-CM | POA: Diagnosis present

## 2018-03-15 DIAGNOSIS — E876 Hypokalemia: Secondary | ICD-10-CM | POA: Diagnosis not present

## 2018-03-15 DIAGNOSIS — R0902 Hypoxemia: Secondary | ICD-10-CM

## 2018-03-15 DIAGNOSIS — I10 Essential (primary) hypertension: Secondary | ICD-10-CM | POA: Diagnosis present

## 2018-03-15 DIAGNOSIS — K219 Gastro-esophageal reflux disease without esophagitis: Secondary | ICD-10-CM | POA: Diagnosis present

## 2018-03-15 DIAGNOSIS — I255 Ischemic cardiomyopathy: Secondary | ICD-10-CM | POA: Diagnosis present

## 2018-03-15 DIAGNOSIS — Z794 Long term (current) use of insulin: Secondary | ICD-10-CM

## 2018-03-15 DIAGNOSIS — G9340 Encephalopathy, unspecified: Secondary | ICD-10-CM

## 2018-03-15 DIAGNOSIS — D649 Anemia, unspecified: Secondary | ICD-10-CM | POA: Diagnosis present

## 2018-03-15 DIAGNOSIS — Z79890 Hormone replacement therapy (postmenopausal): Secondary | ICD-10-CM

## 2018-03-15 DIAGNOSIS — Z955 Presence of coronary angioplasty implant and graft: Secondary | ICD-10-CM

## 2018-03-15 DIAGNOSIS — I5043 Acute on chronic combined systolic (congestive) and diastolic (congestive) heart failure: Secondary | ICD-10-CM

## 2018-03-15 DIAGNOSIS — F329 Major depressive disorder, single episode, unspecified: Secondary | ICD-10-CM | POA: Diagnosis present

## 2018-03-15 DIAGNOSIS — Z9861 Coronary angioplasty status: Secondary | ICD-10-CM

## 2018-03-15 DIAGNOSIS — Z781 Physical restraint status: Secondary | ICD-10-CM

## 2018-03-15 DIAGNOSIS — R0789 Other chest pain: Secondary | ICD-10-CM | POA: Diagnosis not present

## 2018-03-15 DIAGNOSIS — M549 Dorsalgia, unspecified: Secondary | ICD-10-CM | POA: Diagnosis present

## 2018-03-15 DIAGNOSIS — R079 Chest pain, unspecified: Secondary | ICD-10-CM | POA: Diagnosis present

## 2018-03-15 DIAGNOSIS — N184 Chronic kidney disease, stage 4 (severe): Secondary | ICD-10-CM | POA: Diagnosis present

## 2018-03-15 DIAGNOSIS — I130 Hypertensive heart and chronic kidney disease with heart failure and stage 1 through stage 4 chronic kidney disease, or unspecified chronic kidney disease: Principal | ICD-10-CM | POA: Diagnosis present

## 2018-03-15 DIAGNOSIS — G40909 Epilepsy, unspecified, not intractable, without status epilepticus: Secondary | ICD-10-CM | POA: Diagnosis present

## 2018-03-15 DIAGNOSIS — I429 Cardiomyopathy, unspecified: Secondary | ICD-10-CM

## 2018-03-15 DIAGNOSIS — I6782 Cerebral ischemia: Secondary | ICD-10-CM | POA: Diagnosis present

## 2018-03-15 DIAGNOSIS — E785 Hyperlipidemia, unspecified: Secondary | ICD-10-CM | POA: Diagnosis present

## 2018-03-15 DIAGNOSIS — E538 Deficiency of other specified B group vitamins: Secondary | ICD-10-CM | POA: Diagnosis present

## 2018-03-15 DIAGNOSIS — E1122 Type 2 diabetes mellitus with diabetic chronic kidney disease: Secondary | ICD-10-CM | POA: Diagnosis present

## 2018-03-15 DIAGNOSIS — G92 Toxic encephalopathy: Secondary | ICD-10-CM | POA: Diagnosis present

## 2018-03-15 DIAGNOSIS — E039 Hypothyroidism, unspecified: Secondary | ICD-10-CM | POA: Diagnosis present

## 2018-03-15 DIAGNOSIS — Z853 Personal history of malignant neoplasm of breast: Secondary | ICD-10-CM

## 2018-03-15 LAB — COMPREHENSIVE METABOLIC PANEL
ALBUMIN: 3 g/dL — AB (ref 3.5–5.0)
ALT: 15 U/L (ref 0–44)
AST: 25 U/L (ref 15–41)
Alkaline Phosphatase: 40 U/L (ref 38–126)
Anion gap: 10 (ref 5–15)
BUN: 80 mg/dL — AB (ref 8–23)
CO2: 27 mmol/L (ref 22–32)
CREATININE: 2.67 mg/dL — AB (ref 0.44–1.00)
Calcium: 9.2 mg/dL (ref 8.9–10.3)
Chloride: 104 mmol/L (ref 98–111)
GFR calc Af Amer: 21 mL/min — ABNORMAL LOW (ref >60)
GFR calc non Af Amer: 18 mL/min — ABNORMAL LOW (ref >60)
GLUCOSE: 177 mg/dL — AB (ref 70–99)
Potassium: 5.8 mmol/L — ABNORMAL HIGH (ref 3.5–5.1)
Sodium: 141 mmol/L (ref 135–145)
Total Bilirubin: 0.4 mg/dL (ref 0.3–1.2)
Total Protein: 6.9 g/dL (ref 6.5–8.1)

## 2018-03-15 LAB — GLUCOSE, CAPILLARY
GLUCOSE-CAPILLARY: 149 mg/dL — AB (ref 70–99)
Glucose-Capillary: 112 mg/dL — ABNORMAL HIGH (ref 70–99)

## 2018-03-15 LAB — CBC
HCT: 27.6 % — ABNORMAL LOW (ref 36.0–46.0)
HEMOGLOBIN: 8.4 g/dL — AB (ref 12.0–15.0)
MCH: 29.7 pg (ref 26.0–34.0)
MCHC: 30.4 g/dL (ref 30.0–36.0)
MCV: 97.5 fL (ref 80.0–100.0)
Platelets: 212 10*3/uL (ref 150–400)
RBC: 2.83 MIL/uL — ABNORMAL LOW (ref 3.87–5.11)
RDW: 13.2 % (ref 11.5–15.5)
WBC: 6.1 10*3/uL (ref 4.0–10.5)
nRBC: 0 % (ref 0.0–0.2)

## 2018-03-15 LAB — TROPONIN I
Troponin I: <0.03 ng/mL (ref <0.03)
Troponin I: 0.03 ng/mL (ref <0.03)

## 2018-03-15 LAB — I-STAT TROPONIN, ED: Troponin i, poc: 0.03 ng/mL (ref 0.00–0.08)

## 2018-03-15 LAB — HEMOGLOBIN A1C
Hgb A1c MFr Bld: 6.4 % — ABNORMAL HIGH (ref 4.8–5.6)
MEAN PLASMA GLUCOSE: 136.98 mg/dL

## 2018-03-15 LAB — VALPROIC ACID LEVEL: Valproic Acid Lvl: 54 ug/mL (ref 50.0–100.0)

## 2018-03-15 LAB — TYPE AND SCREEN
ABO/RH(D): B POS
Antibody Screen: NEGATIVE

## 2018-03-15 LAB — BRAIN NATRIURETIC PEPTIDE: B Natriuretic Peptide: 1379.9 pg/mL — ABNORMAL HIGH (ref 0.0–100.0)

## 2018-03-15 MED ORDER — HEPARIN SODIUM (PORCINE) 5000 UNIT/ML IJ SOLN
5000.0000 [IU] | Freq: Three times a day (TID) | INTRAMUSCULAR | Status: DC
  Administered 2018-03-15 – 2018-03-25 (×27): 5000 [IU] via SUBCUTANEOUS
  Filled 2018-03-15 (×34): qty 1

## 2018-03-15 MED ORDER — ACETAMINOPHEN 325 MG PO TABS
650.0000 mg | ORAL_TABLET | Freq: Four times a day (QID) | ORAL | Status: DC | PRN
  Administered 2018-03-15 – 2018-03-24 (×2): 650 mg via ORAL
  Filled 2018-03-15 (×2): qty 2

## 2018-03-15 MED ORDER — OXYCODONE-ACETAMINOPHEN 10-325 MG PO TABS: 1.0000 | ORAL_TABLET | Freq: Three times a day (TID) | ORAL | Status: DC | PRN

## 2018-03-15 MED ORDER — MORPHINE SULFATE ER 30 MG PO TBCR
30.0000 mg | EXTENDED_RELEASE_TABLET | Freq: Two times a day (BID) | ORAL | Status: DC
  Administered 2018-03-15 – 2018-03-16 (×3): 30 mg via ORAL
  Filled 2018-03-15 (×3): qty 1

## 2018-03-15 MED ORDER — INSULIN GLARGINE 100 UNIT/ML ~~LOC~~ SOLN
10.0000 [IU] | Freq: Every day | SUBCUTANEOUS | Status: DC
  Administered 2018-03-15 – 2018-03-26 (×11): 10 [IU] via SUBCUTANEOUS
  Filled 2018-03-15 (×14): qty 0.1

## 2018-03-15 MED ORDER — OXYCODONE HCL 5 MG PO TABS
10.0000 mg | ORAL_TABLET | Freq: Three times a day (TID) | ORAL | Status: DC | PRN
  Administered 2018-03-15 – 2018-03-18 (×2): 10 mg via ORAL
  Filled 2018-03-15 (×2): qty 2

## 2018-03-15 MED ORDER — HYDRALAZINE HCL 50 MG PO TABS
100.0000 mg | ORAL_TABLET | Freq: Three times a day (TID) | ORAL | Status: DC
  Administered 2018-03-15 – 2018-03-19 (×9): 100 mg via ORAL
  Filled 2018-03-15 (×18): qty 2

## 2018-03-15 MED ORDER — AMLODIPINE BESYLATE 2.5 MG PO TABS
5.0000 mg | ORAL_TABLET | Freq: Every day | ORAL | Status: DC
  Administered 2018-03-15 – 2018-03-19 (×4): 5 mg via ORAL
  Filled 2018-03-15 (×4): qty 2

## 2018-03-15 MED ORDER — MORPHINE SULFATE ER 30 MG PO CP24: 30.0000 mg | ORAL_CAPSULE | Freq: Two times a day (BID) | ORAL | Status: DC

## 2018-03-15 MED ORDER — DEXTROSE 50 % IV SOLN
25.0000 g | Freq: Once | INTRAVENOUS | Status: AC
  Administered 2018-03-15: 25 g via INTRAVENOUS
  Filled 2018-03-15: qty 50

## 2018-03-15 MED ORDER — ACETAMINOPHEN 650 MG RE SUPP: 650.0000 mg | Freq: Four times a day (QID) | RECTAL | Status: DC | PRN

## 2018-03-15 MED ORDER — INSULIN ASPART 100 UNIT/ML ~~LOC~~ SOLN
0.0000 [IU] | Freq: Three times a day (TID) | SUBCUTANEOUS | Status: DC
  Administered 2018-03-15 – 2018-03-16 (×2): 1 [IU] via SUBCUTANEOUS
  Administered 2018-03-19: 2 [IU] via SUBCUTANEOUS
  Administered 2018-03-19 (×2): 1 [IU] via SUBCUTANEOUS
  Administered 2018-03-20 (×3): 2 [IU] via SUBCUTANEOUS
  Administered 2018-03-21 (×2): 1 [IU] via SUBCUTANEOUS
  Administered 2018-03-21 – 2018-03-22 (×2): 2 [IU] via SUBCUTANEOUS

## 2018-03-15 MED ORDER — ACETAMINOPHEN 325 MG PO TABS
325.0000 mg | ORAL_TABLET | Freq: Three times a day (TID) | ORAL | Status: DC | PRN
  Administered 2018-03-18: 325 mg via ORAL
  Filled 2018-03-15: qty 1

## 2018-03-15 MED ORDER — PREGABALIN 75 MG PO CAPS
75.0000 mg | ORAL_CAPSULE | Freq: Two times a day (BID) | ORAL | Status: DC
  Administered 2018-03-15 – 2018-03-16 (×3): 75 mg via ORAL
  Filled 2018-03-15 (×3): qty 1

## 2018-03-15 MED ORDER — ISOSORBIDE MONONITRATE ER 30 MG PO TB24
30.0000 mg | ORAL_TABLET | Freq: Every day | ORAL | Status: DC
  Administered 2018-03-15 – 2018-03-27 (×10): 30 mg via ORAL
  Filled 2018-03-15 (×11): qty 1

## 2018-03-15 MED ORDER — CARVEDILOL 6.25 MG PO TABS
6.2500 mg | ORAL_TABLET | Freq: Two times a day (BID) | ORAL | Status: DC
  Administered 2018-03-15 – 2018-03-18 (×5): 6.25 mg via ORAL
  Filled 2018-03-15 (×5): qty 1

## 2018-03-15 MED ORDER — FUROSEMIDE 10 MG/ML IJ SOLN
20.0000 mg | Freq: Two times a day (BID) | INTRAMUSCULAR | Status: DC
  Administered 2018-03-15 – 2018-03-16 (×2): 20 mg via INTRAVENOUS
  Filled 2018-03-15 (×3): qty 2

## 2018-03-15 MED ORDER — INSULIN ASPART 100 UNIT/ML ~~LOC~~ SOLN
6.0000 [IU] | Freq: Once | SUBCUTANEOUS | Status: AC
  Administered 2018-03-15: 6 [IU] via INTRAVENOUS

## 2018-03-15 MED ORDER — CALCIUM GLUCONATE 10 % IV SOLN
1.0000 g | Freq: Once | INTRAVENOUS | Status: AC
  Administered 2018-03-15: 1 g via INTRAVENOUS
  Filled 2018-03-15: qty 10

## 2018-03-15 MED ORDER — CLOPIDOGREL BISULFATE 75 MG PO TABS
75.0000 mg | ORAL_TABLET | Freq: Every day | ORAL | Status: DC
  Administered 2018-03-15 – 2018-03-27 (×10): 75 mg via ORAL
  Filled 2018-03-15 (×11): qty 1

## 2018-03-15 MED ORDER — INSULIN GLARGINE 100 UNIT/ML ~~LOC~~ SOLN
30.0000 [IU] | Freq: Every day | SUBCUTANEOUS | Status: DC
  Filled 2018-03-15: qty 0.3

## 2018-03-15 MED ORDER — SODIUM BICARBONATE 8.4 % IV SOLN
50.0000 meq | Freq: Once | INTRAVENOUS | Status: AC
  Administered 2018-03-15: 50 meq via INTRAVENOUS
  Filled 2018-03-15: qty 50

## 2018-03-15 MED ORDER — DIVALPROEX SODIUM 250 MG PO DR TAB
1000.0000 mg | DELAYED_RELEASE_TABLET | Freq: Every day | ORAL | Status: DC
  Administered 2018-03-15 – 2018-03-18 (×3): 1000 mg via ORAL
  Filled 2018-03-15 (×4): qty 4

## 2018-03-15 MED ORDER — INSULIN GLARGINE 100 UNIT/ML ~~LOC~~ SOLN: 10.0000 [IU] | Freq: Every day | SUBCUTANEOUS | Status: DC

## 2018-03-15 MED ORDER — ALBUTEROL SULFATE (2.5 MG/3ML) 0.083% IN NEBU
2.5000 mg | INHALATION_SOLUTION | RESPIRATORY_TRACT | Status: DC | PRN
  Administered 2018-03-23: 2.5 mg via RESPIRATORY_TRACT
  Filled 2018-03-15: qty 3

## 2018-03-15 MED ORDER — BUSPIRONE HCL 5 MG PO TABS
15.0000 mg | ORAL_TABLET | Freq: Two times a day (BID) | ORAL | Status: DC
  Administered 2018-03-15 – 2018-03-27 (×19): 15 mg via ORAL
  Filled 2018-03-15 (×21): qty 3

## 2018-03-15 MED ORDER — LEVOTHYROXINE SODIUM 100 MCG PO TABS
200.0000 ug | ORAL_TABLET | Freq: Every day | ORAL | Status: DC
  Administered 2018-03-16 – 2018-03-18 (×3): 200 ug via ORAL
  Filled 2018-03-15 (×5): qty 2

## 2018-03-15 MED ORDER — INSULIN ASPART 100 UNIT/ML ~~LOC~~ SOLN: 0.0000 [IU] | Freq: Every day | SUBCUTANEOUS | Status: DC

## 2018-03-15 MED ORDER — ATORVASTATIN CALCIUM 80 MG PO TABS
80.0000 mg | ORAL_TABLET | Freq: Every day | ORAL | Status: DC
  Administered 2018-03-15 – 2018-03-26 (×9): 80 mg via ORAL
  Filled 2018-03-15 (×10): qty 1

## 2018-03-15 NOTE — Progress Notes (Addendum)
Pt states she is having chest pain. Chest pain protocol in place. MD notified, EKG complete. Rn spoke to pt in detail about her history and she states this happens the same time every night. She states she gets short of breath and her chest gets tight. She states she might think it is her anxiety. Will continue to monitor pt.

## 2018-03-15 NOTE — Progress Notes (Signed)
Pt is now stating "I am a hypochondria".

## 2018-03-15 NOTE — ED Provider Notes (Signed)
[facility] EMERGENCY DEPARTMENT Provider Note   CSN: [id number] Arrival date & time: 03/15/18  2951    History   Chief Complaint Chief Complaint  Patient presents with  . Chest Pain    HPI Andrea [name] is a 68 y.o. female.     Patient c/o chest pain and sob since 0600 today. Symptoms acute onset, constant, moderate, persistent, non radiating. Denies cough or runny nose. No fever or chills. Denies leg swelling or pain. +orthopnea. Denies heartburn. States compliant w home meds. States felt fine yesterday.   The history is provided by the patient and the EMS personnel.  Chest Pain  Associated symptoms: shortness of breath   Associated symptoms: no abdominal pain, no back pain, no cough, no fever, no headache, no palpitations and no vomiting     Past Medical History:  Diagnosis Date  . Breast cancer ([place])    bilat mastectomy and LUE lymphadenectomy 2014, chemoRx 2014 - 15  . Diabetes mellitus without complication ([place])   . Esophageal reflux   . Hypertension   . Hypothyroid   . Migraine   . Seizure [facility])     Patient Active Problem List   Diagnosis Date Noted  . 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 CHF (congestive heart failure) ([place]) 02/23/2018  . Chronic systolic heart failure ([place]) 02/12/2018  . Acute respiratory failure with hypoxia ([place]) 01/20/2018  . Constipation 01/20/2018  . High risk medication use 01/19/2018  . Chest pain, rule out acute myocardial infarction 01/17/2018  . Cardiomyopathy ([place]) 01/08/2018  . Drug allergy, multiple 01/08/2018  . History of breast cancer 01/08/2018  . Ischemic chest pain ([place]) 01/04/2018  . Chest pain 01/04/2018  . CAD S/P percutaneous coronary angioplasty 12/30/2017  . Acute CHF (congestive heart failure) ([place]) 12/26/2017  . Unspecified abdominal pain 06/02/2017  . C. difficile colitis 03/17/2017  . AKI (acute kidney injury) ([place])   . Diverticulitis  02/17/2017  . Frequent falls 01/08/2017  . Rhabdomyolysis 01/08/2017  . Intractable nausea and vomiting 11/23/2016  . Insulin dependent diabetes mellitus ([place])   . Multiple rib fractures 11/28/2013  . Diarrhea 10/05/2013  . Dyslipidemia 09/16/2013  . History of seizures 08/04/2013  . Chronic pain 07/01/2013  . Anemia 03/16/2013  . Inadequate pain control 03/15/2013  . Cancer of left breast ([place]) 01/20/2013  . Migraine headache 11/10/2012  . 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 Diabetic peripheral neuropathy associated with type 2 diabetes mellitus ([place]) 04/17/2012  . GAD (generalized anxiety disorder) 05/25/2010  . Persistent insomnia 05/25/2010  . Disorder of kidney and ureter 01/26/2009  . Type 2 diabetes mellitus with hyperglycemia, with long-term current use of insulin ([place]) 11/03/2008  . Essential hypertension 09/21/2008  . Esophageal reflux 08/05/2008  . Seizure disorder ([place]) 04/12/2008  . Hypothyroidism 02/27/2007    Past Surgical History:  Procedure Laterality Date  . ABDOMINAL HYSTERECTOMY     1984 , done for ruptured cyst and endometriosis  . APPENDECTOMY    . CHOLECYSTECTOMY     1980's  . CORONARY STENT INTERVENTION N/A 12/30/2017   Procedure: CORONARY STENT INTERVENTION;  Surgeon: Martinique, Peter M, MD;  Location: [facility];  Service: Cardiovascular;  Laterality: N/A;  . EYE SURGERY  08/28/2015   Right eye  . KNEE SURGERY Left   . MASTECTOMY     bilateral  . Open surgery for bowel obstruction, 2000's    . RIGHT/LEFT  HEART CATH AND CORONARY ANGIOGRAPHY N/A 12/29/2017   Procedure: RIGHT/LEFT HEART CATH AND CORONARY ANGIOGRAPHY;  Surgeon: Martinique, Peter M, MD;  Location: [facility];  Service: Cardiovascular;  Laterality: N/A;     OB History   No obstetric history on file.      Home Medications    Prior to Admission medications   Medication Sig Start Date End Date Taking? Authorizing Provider    atorvastatin (LIPITOR) 80 MG tablet Take 1 tablet (80 mg total) by mouth at bedtime. 01/28/18   Lendon Colonel, NP  carvedilol (COREG) 6.25 MG tablet Take 1 tablet (6.25 mg total) by mouth 2 (two) times daily with a meal. 01/28/18   Lendon Colonel, NP  clopidogrel (PLAVIX) 75 MG tablet Take 1 tablet (75 mg total) by mouth daily. 01/28/18   Lendon Colonel, NP  divalproex (DEPAKOTE) 250 MG DR tablet Take 1,000 mg by mouth at bedtime.     [provider]  ELDERBERRY PO Take 1 Dose by mouth daily.    [provider]  furosemide (LASIX) 20 MG tablet Take 1 tablet (20 mg total) by mouth daily. 03/09/18   Erlene Quan, PA-C  hydrALAZINE (APRESOLINE) 100 MG tablet Take 1 tablet (100 mg total) by mouth every 8 (eight) hours. 03/07/18   Lavina Hamman, MD  isosorbide mononitrate (IMDUR) 30 MG 24 hr tablet Take 1 tablet (30 mg total) by mouth daily. 03/08/18   Lavina Hamman, MD  levothyroxine (SYNTHROID, LEVOTHROID) 200 MCG tablet Take 1 tablet (200 mcg total) by mouth at bedtime. Patient taking differently: Take 200 mcg by mouth daily before breakfast.  01/12/17   Lavina Hamman, MD  morphine (KADIAN) 30 MG 24 hr capsule Take 30 mg by mouth 2 (two) times daily.    [provider]  oxyCODONE-acetaminophen (PERCOCET) 10-325 MG tablet Take 1 tablet by mouth 3 (three) times daily as needed for pain. 02/03/17   [provider]  polyethylene glycol (MIRALAX / GLYCOLAX) packet Take 17 g by mouth daily. 03/08/18   Lavina Hamman, MD  pregabalin (LYRICA) 75 MG capsule Take 1 capsule (75 mg total) 2 (two) times daily by mouth. 11/24/16   Benito Mccreedy, MD  promethazine (PHENERGAN) 25 MG tablet Take 25 mg by mouth every 6 (six) hours as needed for nausea.  05/30/17   [provider]  senna-docusate (SENOKOT-S) 8.6-50 MG tablet Take 2 tablets by mouth 2 (two) times daily. Patient taking differently: Take 2 tablets by mouth daily as needed for mild constipation.   01/22/18   Geradine Girt, DO  Vitamin D, Ergocalciferol, (DRISDOL) 1.25 MG (50000 UT) CAPS capsule Take 50,000 Units by mouth every 7 (seven) days.    [provider]    Family History Family History  Problem Relation Age of Onset  . Sudden Cardiac Death Neg Hx     Social History Social History   Tobacco Use  . Smoking status: Never Smoker  . Smokeless tobacco: Never Used  Substance Use Topics  . Alcohol use: No  . Drug use: No     Allergies   Ticagrelor; Aspartame and phenylalanine; Aspirin; Maxzide [hydrochlorothiazide w-triamterene]; Metformin and related; Nsaids; Other; Pravachol [pravastatin]; Spironolactone; Stadol [butorphanol]; Toradol [ketorolac tromethamine]; Vistaril [hydroxyzine hcl]; Erythromycin; Morphine and related; and Penicillins   Review of Systems Review of Systems  Constitutional: Negative for fever.  HENT: Negative for sore throat.   Eyes: Negative for redness.  Respiratory: Positive for shortness of breath.  Negative for cough.   Cardiovascular: Positive for chest pain. Negative for palpitations and leg swelling.  Gastrointestinal: Negative for abdominal pain, diarrhea and vomiting.  Endocrine: Negative for polyuria.  Genitourinary: Negative for flank pain.  Musculoskeletal: Negative for back pain and neck pain.  Skin: Negative for rash.  Neurological: Negative for headaches.  Hematological: Does not bruise/bleed easily.  Psychiatric/Behavioral: Negative for confusion.     Physical Exam Updated Vital Signs BP (!) 156/73 (BP Location: Right Arm)   Pulse 93   Temp (!) 97.5 F (36.4 C) (Oral)   Resp 12   Ht 1.626 m (5\' 4" )   Wt 72.8 kg   SpO2 100%   BMI 27.55 kg/m   Physical Exam Vitals signs and nursing note reviewed.  Constitutional:      Appearance: Normal appearance. She is well-developed.  HENT:     Head: Atraumatic.     Nose: Nose normal.     Mouth/Throat:     Mouth: Mucous membranes are moist.  Eyes:     General: No  scleral icterus.    Conjunctiva/sclera: Conjunctivae normal.     Pupils: Pupils are equal, round, and reactive to light.  Neck:     Musculoskeletal: Normal range of motion and neck supple. No neck rigidity or muscular tenderness.     Trachea: No tracheal deviation.  Cardiovascular:     Rate and Rhythm: Normal rate and regular rhythm.     Pulses: Normal pulses.     Heart sounds: Normal heart sounds. No murmur. No friction rub. No gallop.   Pulmonary:     Effort: Pulmonary effort is normal. No respiratory distress.     Breath sounds: Normal breath sounds.  Chest:     Chest wall: No tenderness.  Abdominal:     General: Bowel sounds are normal. There is no distension.     Palpations: Abdomen is soft.     Tenderness: There is no abdominal tenderness. There is no guarding.  Genitourinary:    Comments: No cva tenderness.  Musculoskeletal:        General: No swelling.  Skin:    General: Skin is warm and dry.     Findings: No rash.  Neurological:     Mental Status: She is alert.     Comments: Alert, speech normal.   Psychiatric:        Mood and Affect: Mood normal.      ED Treatments / Results  Labs (all labs ordered are listed, but only abnormal results are displayed) Results for orders placed or performed during the hospital encounter of 03/15/18  CBC  Result Value Ref Range   WBC 6.1 4.0 - 10.5 K/uL   RBC 2.83 (L) 3.87 - 5.11 MIL/uL   Hemoglobin 8.4 (L) 12.0 - 15.0 g/dL   HCT 27.6 (L) 36.0 - 46.0 %   MCV 97.5 80.0 - 100.0 fL   MCH 29.7 26.0 - 34.0 pg   MCHC 30.4 30.0 - 36.0 g/dL   RDW 13.2 11.5 - 15.5 %   Platelets 212 150 - 400 K/uL   nRBC 0.0 0.0 - 0.2 %  Comprehensive metabolic panel  Result Value Ref Range   Sodium 141 135 - 145 mmol/L   Potassium 5.8 (H) 3.5 - 5.1 mmol/L   Chloride 104 98 - 111 mmol/L   CO2 27 22 - 32 mmol/L   Glucose, Bld 177 (H) 70 - 99 mg/dL   BUN 80 (H) 8 - 23 mg/dL   Creatinine, Ser  2.67 (H) 0.44 - 1.00 mg/dL   Calcium 9.2 8.9 - 10.3  mg/dL   Total Protein 6.9 6.5 - 8.1 g/dL   Albumin 3.0 (L) 3.5 - 5.0 g/dL   AST 25 15 - 41 U/L   ALT 15 0 - 44 U/L   Alkaline Phosphatase 40 38 - 126 U/L   Total Bilirubin 0.4 0.3 - 1.2 mg/dL   GFR calc non Af Amer 18 (L) >60 mL/min   GFR calc Af Amer 21 (L) >60 mL/min   Anion gap 10 5 - 15  Brain natriuretic peptide  Result Value Ref Range   B Natriuretic Peptide 1,379.9 (H) 0.0 - 100.0 pg/mL  Valproic acid level  Result Value Ref Range   Valproic Acid Lvl 54 50.0 - 100.0 ug/mL  I-stat troponin, ED  Result Value Ref Range   Troponin i, poc 0.03 0.00 - 0.08 ng/mL   Comment 3           Ct Abdomen Pelvis Wo Contrast  Result Date: 03/01/2018 CLINICAL DATA:  68 year old female with history of intermittent vomiting. EXAM: CT ABDOMEN AND PELVIS WITHOUT CONTRAST TECHNIQUE: Multidetector CT imaging of the abdomen and pelvis was performed following the standard protocol without IV contrast. COMPARISON:  CT the abdomen and pelvis 06/01/2017. FINDINGS: Lower chest: Bilateral breast implants are incidentally noted. Atherosclerotic calcifications in the descending thoracic aorta as well as the left anterior descending and right coronary arteries. Small bilateral pleural effusions lying dependently. This is associated with some passive atelectasis in the lower lobes of the lungs bilaterally. Septal thickening throughout the visualize lung bases concerning for a background of mild interstitial pulmonary edema. Hepatobiliary: No definite suspicious cystic or solid hepatic lesions are confidently identified on today's noncontrast CT examination. Status post cholecystectomy. Pancreas: No definite pancreatic mass or peripancreatic fluid or inflammatory changes are noted on today's noncontrast CT examination. Spleen: Unremarkable. Adrenals/Urinary Tract: Left-sided pelvic kidney. No calculi identified within the collecting system of either kidney, along the course of either ureter or within the lumen of the  urinary bladder. No hydroureteronephrosis. Urinary bladder is normal in appearance. Stomach/Bowel: Unenhanced appearance of the stomach is normal. No pathologic dilatation of small bowel or colon. The appendix is not confidently identified and may be surgically absent. Regardless, there are no inflammatory changes noted adjacent to the cecum to suggest the presence of an acute appendicitis at this time. Vascular/Lymphatic: Aortic atherosclerosis. No lymphadenopathy noted in the abdomen or pelvis. Reproductive: Status post hysterectomy. Ovaries are not confidently identified may be surgically absent or atrophic. Other: No significant volume of ascites.  No pneumoperitoneum. Musculoskeletal: There are no aggressive appearing lytic or blastic lesions noted in the visualized portions of the skeleton. IMPRESSION: 1. No acute findings are noted in the abdomen or pelvis to account for the patient's symptoms. 2. The appearance of the lung bases suggests interstitial pulmonary edema with small bilateral pleural effusions. 3. Aortic atherosclerosis, in addition to least 2 vessel coronary artery disease. Please note that although the presence of coronary artery calcium documents the presence of coronary artery disease, the severity of this disease and any potential stenosis cannot be assessed on this non-gated CT examination. Assessment for potential risk factor modification, dietary therapy or pharmacologic therapy may be warranted, if clinically indicated. 4. Additional incidental findings, as above. Electronically Signed   By: Vinnie Langton M.D.   On: 03/01/2018 16:24   Dg Chest 2 View  Result Date: 03/04/2018 CLINICAL DATA:  68 year old female with a history of  shortness of breath EXAM: CHEST - 2 VIEW COMPARISON:  02/25/2018, 02/23/2018 01/20/2018 FINDINGS: Cardiomediastinal silhouette unchanged in size and contour. Increasing opacity at the right lung base with obscuration the right hemidiaphragm and right heart  or. Blunting at the left costophrenic angle. Opacity in sulcus on the lateral view with thickening of the fissures. Interlobular septal thickening. No pneumothorax. IMPRESSION: Increasing opacities at the lung bases compatible with increasing pleural effusions with background of edema. Multifocal infection not excluded. Electronically Signed   By: Corrie Mckusick D.O.   On: 03/04/2018 19:32   Dg Chest 2 View  Result Date: 02/25/2018 CLINICAL DATA:  Hypoxia. EXAM: CHEST - 2 VIEW COMPARISON:  December 24, 2018 FINDINGS: No pneumothorax. The heart size is borderline. The hila and mediastinum are unremarkable. Small bilateral pleural effusions. No nodules, masses, or suspicious infiltrates. IMPRESSION: Small bilateral pleural effusions. Possible mild pulmonary venous congestion. No overt edema. Electronically Signed   By: Dorise Bullion III M.D   On: 02/25/2018 11:18   Dg Chest 2 View  Result Date: 02/23/2018 CLINICAL DATA:  Worsening shortness of breath, worse when lying down. History of CHF. EXAM: CHEST - 2 VIEW COMPARISON:  Chest radiograph January 20, 2018 FINDINGS: Cardiac silhouette is normal in size. Mediastinal silhouette is not suspicious increased interstitial prominence with small pleural effusion most apparent posteriorly. No pneumothorax. Soft tissue planes and included osseous structures are non suspicious. Bilateral breast implants Surgical clips in the included right abdomen compatible with cholecystectomy. Mild approximate T10 compression fracture. IMPRESSION: 1. Increased interstitial prominence with small pleural effusion concerning for pulmonary edema. Electronically Signed   By: Elon Alas M.D.   On: 02/23/2018 05:45    EKG EKG Interpretation  Date/Time:  Sunday March 15 2018 06:54:10 EST Ventricular Rate:  93 PR Interval:    QRS Duration: 84 QT Interval:  375 QTC Calculation: 467 R Axis:   34 Text Interpretation:  Sinus rhythm No significant change since last tracing  Confirmed by Lajean Saver [phone]) on 03/15/2018 9:35:57 AM   Radiology Dg Chest 2 View  Result Date: 03/15/2018 CLINICAL DATA:  Chest pain. EXAM: CHEST - 2 VIEW COMPARISON:  03/04/2018 FINDINGS: Enlarged cardiac silhouette. Mediastinal contours appear intact. Patchy interstitial and alveolar opacities with lower lobe predominance. Small bilateral pleural effusions can not be excluded. Osseous structures are without acute abnormality. Soft tissues are grossly normal. IMPRESSION: 1. Enlarged cardiac silhouette. 2. Patchy interstitial and alveolar opacities with lower lobe predominance. Findings may represent mixed pattern pulmonary edema or multifocal pneumonia. 3. Small bilateral pleural effusions can not be excluded. Electronically Signed   By: Fidela Salisbury M.D.   On: 03/15/2018 08:57    Procedures Procedures (including critical care time)  Medications Ordered in ED Medications - No data to display   Initial Impression / Assessment and Plan / ED Course  I have reviewed the triage vital signs and the nursing notes.  Pertinent labs & imaging results that were available during my care of the patient were reviewed by me and considered in my medical decision making (see chart for details).  Iv ns. Continuous pulse ox and monitor. o2 [place]. Cxr. Labs. Ecg.   Reviewed nursing notes and prior charts for additional history.   Labs reviewed - mild aki, hx cri, k is elevated.   High k - d50 iv, hco3 iv, novolog iv, cagl iv.   From labs, bnp is again quite high c/w chf.  cxr reviewed - vascular congestion.   Unclear why recurrent chf/frequent hospitalizations  chf - suspect multifactorial, CRI, anemia, etc. ?whether patient would benefit from prbc transfusion, given significant anemia, recurrent chf.   Hospitalists consulted for admission.  CRITICAL CARE RE: acute on chronic CHF/exacerbation, CRI w AKI, hyperkalemia, anemia.  Performed by: Mirna Mires Total critical care time: 40  minutes Critical care time was exclusive of separately billable procedures and treating other patients. Critical care was necessary to treat or prevent imminent or life-threatening deterioration. Critical care was time spent personally by me on the following activities: development of treatment plan with patient and/or surrogate as well as nursing, discussions with consultants, evaluation of patient's response to treatment, examination of patient, obtaining history from patient or surrogate, ordering and performing treatments and interventions, ordering and review of laboratory studies, ordering and review of radiographic studies, pulse oximetry and re-evaluation of patient's condition.   Final Clinical Impressions(s) / ED Diagnoses   Final diagnoses:  None    ED Discharge Orders    None       Lajean Saver, MD 03/15/18 [phone]

## 2018-03-15 NOTE — Progress Notes (Signed)
Most recent ED note reviewed.

## 2018-03-15 NOTE — Plan of Care (Signed)
  Problem: Education: Goal: Knowledge of General Education information will improve Description Including pain rating scale, medication(s)/side effects and non-pharmacologic comfort measures Outcome: Progressing   Problem: Education: Goal: Ability to demonstrate management of disease process will improve Outcome: Progressing Goal: Ability to verbalize understanding of medication therapies will improve Outcome: Progressing   Problem: Activity: Goal: Capacity to carry out activities will improve Outcome: Progressing   Problem: Cardiac: Goal: Ability to achieve and maintain adequate cardiopulmonary perfusion will improve Outcome: Progressing

## 2018-03-15 NOTE — H&P (Addendum)
History and Physical    Andrea [name] QIO:[id number] DOB: 30-Nov-1950 DOA: 03/15/2018  PCP: Medicine, [facility]  Patient coming from: home    I have personally briefly reviewed patient's old medical records available.   Chief Complaint: shortness of breath, chest pain   HPI: Andrea [name] is a 68 y.o. female with medical history significant of severe triple-vessel coronary artery disease status post cardiac cath no PCI, poor candidate for bypass surgery, ischemic cardiomyopathy recently improved ejection fraction, multiple hospitalizations, diabetes on insulin, hypothyroidism, hypertension, seizure disorder, chronic pain disorder who presents to the emergency room with worsening shortness of breath and cough as well as chest pain.  Patient has been to the hospital multiple times.  He was recently admitted this month with congestive heart failure exacerbation as well as fluctuating renal functions.  She was deemed a poor candidate for bypass and coronary artery disease not amenable to stenting.  Was doing poorly for last few days with more shortness of breath, today morning she woke up at about 6 with sudden onset, constant, persistent, severe left precordial chest pain, felt like elephant walking on her chest, associated with cough and wheezing along with a rattling noise on her breathing.  No nausea or vomiting.  No fever.  No sputum production.  Patient does have orthopnea which is mostly chronic. ED Course: Afebrile.  Blood pressures elevated.  Hemoglobin 8.4 which is at about baseline.  Potassium 5.8.  Creatinine is 2.67, her baseline creatinine fluctuates about 2-2.4.  Twelve-lead EKG without any acute changes.  Troponin is nonischemic.  proBNP 1100.  Chest x-ray shows mostly bilateral symmetrical lower lobe opacities and chronic cardiomegaly consistent with fluid overload.  Patient was given insulin dextrose and bicarb combination for her hyperkalemia.  Review of  Systems: As per HPI otherwise 10 point review of systems negative.    Past Medical History:  Diagnosis Date  . Breast cancer ([place])    bilat mastectomy and LUE lymphadenectomy 2014, chemoRx 2014 - 15  . Diabetes mellitus without complication ([place])   . Esophageal reflux   . Hypertension   . Hypothyroid   . Migraine   . Seizure [facility])     Past Surgical History:  Procedure Laterality Date  . ABDOMINAL HYSTERECTOMY     1984 , done for ruptured cyst and endometriosis  . APPENDECTOMY    . CHOLECYSTECTOMY     1980's  . CORONARY STENT INTERVENTION N/A 12/30/2017   Procedure: CORONARY STENT INTERVENTION;  Surgeon: Martinique, Peter M, MD;  Location: [place] [facility];  Service: Cardiovascular;  Laterality: N/A;  . EYE SURGERY  08/28/2015   Right eye  . KNEE SURGERY Left   . MASTECTOMY     bilateral  . Open surgery for bowel obstruction, 2000's    . RIGHT/LEFT HEART CATH AND CORONARY ANGIOGRAPHY N/A 12/29/2017   Procedure: RIGHT/LEFT HEART CATH AND CORONARY ANGIOGRAPHY;  Surgeon: Martinique, Peter M, MD;  Location: [place] [facility];  Service: Cardiovascular;  Laterality: N/A;     reports that she has never smoked. She has never used smokeless tobacco. She reports that she does not drink alcohol or use drugs.  Allergies  Allergen Reactions  . Ticagrelor Rash  . Aspartame And Phenylalanine Hives and Diarrhea  . Aspirin Nausea And Vomiting  . Maxzide [Hydrochlorothiazide W-Triamterene] Swelling    Fluid retention  . Metformin And Related Diarrhea  . Nsaids Nausea And Vomiting  . Other Other (See Comments)    All steroids produce psychosis  per pt Artificial sweeteners produce nausea and upset stomach.  Gregary Cromer [Pravastatin] Other (See Comments)    unknown  . Spironolactone   . Stadol [Butorphanol] Other (See Comments)    Toradol, etc And related- hallucinations  . Toradol [Ketorolac Tromethamine] Other (See Comments)    hallucinations  . Vistaril [Hydroxyzine Hcl] Other (See  Comments)    unknown  . Erythromycin Itching and Rash  . Morphine And Related Hives and Rash    Iv morphine.  . Penicillins Hives, Itching and Rash    Has patient had a PCN reaction causing immediate rash, facial/tongue/throat swelling, SOB or lightheadedness with hypotension: yes Has patient had a PCN reaction causing severe rash involving mucus membranes or skin necrosis: unknown Has patient had a PCN reaction that required hospitalization : unknown Has patient had a PCN reaction occurring within the last 10 years: no If all of the above answers are "NO", then may proceed with Cephalosporin use.     Family History  Problem Relation Age of Onset  . Sudden Cardiac Death Neg Hx      Prior to Admission medications   Medication Sig Start Date End Date Taking? Authorizing Provider  amLODipine (NORVASC) 5 MG tablet Take 5 mg by mouth daily. 03/09/18  Yes [provider]  atorvastatin (LIPITOR) 80 MG tablet Take 1 tablet (80 mg total) by mouth at bedtime. 01/28/18  Yes Lendon Colonel, NP  busPIRone (BUSPAR) 15 MG tablet Take 15 mg by mouth 2 (two) times daily. 03/09/18 03/09/19 Yes [provider]  carvedilol (COREG) 6.25 MG tablet Take 1 tablet (6.25 mg total) by mouth 2 (two) times daily with a meal. 01/28/18  Yes Lendon Colonel, NP  clopidogrel (PLAVIX) 75 MG tablet Take 1 tablet (75 mg total) by mouth daily. 01/28/18  Yes Lendon Colonel, NP  divalproex (DEPAKOTE) 250 MG DR tablet Take 1,000 mg by mouth at bedtime.    Yes [provider]  furosemide (LASIX) 20 MG tablet Take 1 tablet (20 mg total) by mouth daily. 03/09/18  Yes Kilroy, Luke K, PA-C  hydrALAZINE (APRESOLINE) 100 MG tablet Take 1 tablet (100 mg total) by mouth every 8 (eight) hours. 03/07/18  Yes Lavina Hamman, MD  isosorbide mononitrate (IMDUR) 30 MG 24 hr tablet Take 1 tablet (30 mg total) by mouth daily. 03/08/18  Yes Lavina Hamman, MD  levothyroxine (SYNTHROID, LEVOTHROID) 200 MCG tablet  Take 1 tablet (200 mcg total) by mouth at bedtime. Patient taking differently: Take 200 mcg by mouth daily before breakfast.  01/12/17  Yes Lavina Hamman, MD  morphine (KADIAN) 30 MG 24 hr capsule Take 30 mg by mouth 2 (two) times daily.   Yes [provider]  oxyCODONE-acetaminophen (PERCOCET) 10-325 MG tablet Take 1 tablet by mouth 3 (three) times daily as needed for pain. 02/03/17  Yes [provider]  polyethylene glycol (MIRALAX / GLYCOLAX) packet Take 17 g by mouth daily. Patient taking differently: Take 17 g by mouth daily as needed for mild constipation.  03/08/18  Yes Lavina Hamman, MD  pregabalin (LYRICA) 75 MG capsule Take 1 capsule (75 mg total) 2 (two) times daily by mouth. 11/24/16  Yes Osei-Bonsu, Iona Beard, MD  promethazine (PHENERGAN) 25 MG tablet Take 25 mg by mouth every 6 (six) hours as needed for nausea.  05/30/17  Yes [provider]  senna-docusate (SENOKOT-S) 8.6-50 MG tablet Take 2 tablets by mouth 2 (two) times daily. 01/22/18  Yes Geradine Girt, DO  Vitamin D, Ergocalciferol, (DRISDOL) 1.25 MG (50000 UT) CAPS capsule Take 50,000 Units by mouth every Tuesday.    Yes [provider]    Physical Exam: Vitals:   03/15/18 0900 03/15/18 0915 03/15/18 0930 03/15/18 0945  BP: (!) 164/80 (!) 157/84 (!) 174/82   Pulse: 86 88    Resp: (!) 22 19 14  (!) 26  Temp:      TempSrc:      SpO2: 99% 100%    Weight:      Height:        Constitutional: NAD, calm, comfortable Vitals:   03/15/18 0900 03/15/18 0915 03/15/18 0930 03/15/18 0945  BP: (!) 164/80 (!) 157/84 (!) 174/82   Pulse: 86 88    Resp: (!) 22 19 14  (!) 26  Temp:      TempSrc:      SpO2: 99% 100%    Weight:      Height:       Eyes: PERRL, lids and conjunctivae normal Chronically sick looking.  Anxious. ENMT: Mucous membranes are moist. Posterior pharynx clear of any exudate or lesions.Normal dentition.  Neck: normal, supple, no masses, no thyromegaly Respiratory: Bilateral  conducted airway sounds, prolonged expiration and wheezing.  Bilateral basal crackles.  Normal respiratory effort. No accessory muscle use.  On 2 L oxygen. Cardiovascular: Regular rate and rhythm, no murmurs / rubs / gallops. No extremity edema. 2+ pedal pulses. No carotid bruits.  Abdomen: no tenderness, no masses palpated. No hepatosplenomegaly. Bowel sounds positive.  Musculoskeletal: no clubbing / cyanosis. No joint deformity upper and lower extremities. Good ROM, no contractures. Normal muscle tone.  Skin: no rashes, lesions, ulcers. No induration Neurologic: CN 2-12 grossly intact. Sensation intact, DTR normal. Strength 5/5 in all 4.  Psychiatric: Normal judgment and insight. Alert and oriented x 3.  Anxious.    Labs on Admission: I have personally reviewed following labs and imaging studies  CBC: Recent Labs  Lab 03/15/18 0728  WBC 6.1  HGB 8.4*  HCT 27.6*  MCV 97.5  PLT 703   Basic Metabolic Panel: Recent Labs  Lab 03/15/18 0728  NA 141  K 5.8*  CL 104  CO2 27  GLUCOSE 177*  BUN 80*  CREATININE 2.67*  CALCIUM 9.2   GFR: Estimated Creatinine Clearance: 20.3 mL/min (A) (by C-G formula based on SCr of 2.67 mg/dL (H)). Liver Function Tests: Recent Labs  Lab 03/15/18 0728  AST 25  ALT 15  ALKPHOS 40  BILITOT 0.4  PROT 6.9  ALBUMIN 3.0*   No results for input(s): LIPASE, AMYLASE in the last 168 hours. No results for input(s): AMMONIA in the last 168 hours. Coagulation Profile: No results for input(s): INR, PROTIME in the last 168 hours. Cardiac Enzymes: No results for input(s): CKTOTAL, CKMB, CKMBINDEX, TROPONINI in the last 168 hours. BNP (last 3 results) No results for input(s): PROBNP in the last 8760 hours. HbA1C: No results for input(s): HGBA1C in the last 72 hours. CBG: No results for input(s): GLUCAP in the last 168 hours. Lipid Profile: No results for input(s): CHOL, HDL, LDLCALC, TRIG, CHOLHDL, LDLDIRECT in the last 72 hours. Thyroid Function  Tests: No results for input(s): TSH, T4TOTAL, FREET4, T3FREE, THYROIDAB in the last 72 hours. Anemia Panel: No results for input(s): VITAMINB12, FOLATE, FERRITIN, TIBC, IRON, RETICCTPCT in the last 72 hours. Urine analysis:    Component Value Date/Time   COLORURINE YELLOW 02/25/2018 2316   APPEARANCEUR HAZY (A) 02/25/2018 2316   LABSPEC 1.014 02/25/2018 2316  PHURINE 5.0 02/25/2018 2316   GLUCOSEU NEGATIVE 02/25/2018 2316   HGBUR SMALL (A) 02/25/2018 2316   BILIRUBINUR NEGATIVE 02/25/2018 2316   KETONESUR NEGATIVE 02/25/2018 2316   PROTEINUR >=300 (A) 02/25/2018 2316   UROBILINOGEN 1.0 12/02/2013 1612   NITRITE NEGATIVE 02/25/2018 2316   LEUKOCYTESUR SMALL (A) 02/25/2018 2316    Radiological Exams on Admission: Dg Chest 2 View  Result Date: 03/15/2018 CLINICAL DATA:  Chest pain. EXAM: CHEST - 2 VIEW COMPARISON:  03/04/2018 FINDINGS: Enlarged cardiac silhouette. Mediastinal contours appear intact. Patchy interstitial and alveolar opacities with lower lobe predominance. Small bilateral pleural effusions can not be excluded. Osseous structures are without acute abnormality. Soft tissues are grossly normal. IMPRESSION: 1. Enlarged cardiac silhouette. 2. Patchy interstitial and alveolar opacities with lower lobe predominance. Findings may represent mixed pattern pulmonary edema or multifocal pneumonia. 3. Small bilateral pleural effusions can not be excluded. Electronically Signed   By: Fidela Salisbury M.D.   On: 03/15/2018 08:57    EKG: Independently reviewed.  Normal sinus rhythm.  No acute ST-T wave changes.  Assessment/Plan Active Problems:   Essential hypertension   Insulin dependent diabetes mellitus (HCC)   Hypothyroidism   History of seizures   Esophageal reflux   Acute CHF (congestive heart failure) (HCC)   Chest pain   Cardiomyopathy (HCC)   CHF (congestive heart failure) (HCC)   Anemia   GAD (generalized anxiety disorder)   Hyperkalemia     1.  Chest pain:  Suspect related to cough and shortness of breath.  She also has severe coronary artery disease. We will admit patient to the telemetry unit given severity of symptoms. Currently chest pain improved. Cycle EKG and troponins. Supplemental oxygen to keep saturations more than 90%. Resume aspirin and Plavix. Patient is optimized on hydralazine, nitrates, beta-blockers. Patient does have extensive coronary artery disease and decided to be nonsurgical candidate. If recurrent chest pain or positive troponins, will discuss with cardiology.  2.  Acute congestive heart failure: Probably combined systolic and diastolic heart failure.  Some evidence of fluid overload including increasing oxygen requirement and elevated proBNP. Will challenge with IV Lasix and monitor.  She is already on heart failure regimen. Delene Loll was discontinued recently because of hyperkalemia.  3.  Acute on chronic renal failure: Suspect cardiorenal syndrome.  Will challenge with IV Lasix and monitor.  4.  Type 2 diabetes on insulin: Continue long-acting insulin and sliding scale insulin.  Fairly controlled as per patient. Addendum: Patient unsure whether she takes insulin at home or not.  She told me that she takes 30 units of Lantus at night.  Last admission, insulin has been discontinued.  Will check A1c.  Will keep patient on low-dose Lantus 10 units at night as well as sliding scale insulin.  5.  Chronic pain syndrome: Patient is on chronic opiate management including long-acting morphine, hydrocodone, she is on Lyrica that she will continue.  6.  History of seizure disorder: No evidence of new seizure.  Continue seizure medications from home.  Recurrent hospitalizations. Multiple chronic medical conditions difficult to treat. I discussed in detail with patient, explained to her heart and kidney conditions which are difficult to treat. Discussed with her son Gerald Stabs over the phone and other son at the bedside. I will also  consult palliative care to educate her regarding difficult to treat conditions and counseling.  DVT prophylaxis: Heparin subcu. Code Status: Full code. Family Communication: Son at the bedside. Disposition Plan: Home with home health care. Consults called: None.  Admission status: Observation.   [name] MD [facility] Pager [phone]  If 7PM-7AM, please contact night-coverage [email or web address] Password [facility]  03/15/2018, 11:54 AM

## 2018-03-15 NOTE — ED Triage Notes (Signed)
Pt arrived from home via EMS with chest pain with onset last p.m. about 1800.  Pt also with some wheezing in right lungs, prehospital tx albuterol x2 and NTG x2

## 2018-03-15 NOTE — ED Notes (Signed)
Pt walked to restroom using walker (uses at baseline), O2 sats dropped to 88% on RA, assisted back to bed, O2 92%. Provided 2L O2 and RN made aware. O2 96% on 2L.

## 2018-03-15 NOTE — ED Notes (Signed)
Patient ambulating to the bathroom

## 2018-03-15 NOTE — Progress Notes (Signed)
CRITICAL VALUE ALERT  Critical Value:  Troponin: 0.03  Date & Time Notied:  03/15/2018, 2037  Provider Notified: [facility]  Orders Received/Actions taken: awaiting

## 2018-03-15 NOTE — Progress Notes (Signed)
Per ED RN pt is chest pain free, is only experiencing SOB with exertion. Pt approved for receipt.

## 2018-03-16 DIAGNOSIS — I5041 Acute combined systolic (congestive) and diastolic (congestive) heart failure: Secondary | ICD-10-CM | POA: Diagnosis not present

## 2018-03-16 LAB — CBC
HCT: 26.5 % — ABNORMAL LOW (ref 36.0–46.0)
Hemoglobin: 8.1 g/dL — ABNORMAL LOW (ref 12.0–15.0)
MCH: 28.9 pg (ref 26.0–34.0)
MCHC: 30.6 g/dL (ref 30.0–36.0)
MCV: 94.6 fL (ref 80.0–100.0)
Platelets: 200 10*3/uL (ref 150–400)
RBC: 2.8 MIL/uL — ABNORMAL LOW (ref 3.87–5.11)
RDW: 13.2 % (ref 11.5–15.5)
WBC: 6.3 10*3/uL (ref 4.0–10.5)
nRBC: 0 % (ref 0.0–0.2)

## 2018-03-16 LAB — BASIC METABOLIC PANEL
Anion gap: 9 (ref 5–15)
BUN: 76 mg/dL — ABNORMAL HIGH (ref 8–23)
CO2: 29 mmol/L (ref 22–32)
CREATININE: 2.49 mg/dL — AB (ref 0.44–1.00)
Calcium: 9.1 mg/dL (ref 8.9–10.3)
Chloride: 105 mmol/L (ref 98–111)
GFR calc non Af Amer: 19 mL/min — ABNORMAL LOW (ref >60)
GFR, EST AFRICAN AMERICAN: 23 mL/min — AB (ref >60)
Glucose, Bld: 95 mg/dL (ref 70–99)
Potassium: 5 mmol/L (ref 3.5–5.1)
Sodium: 143 mmol/L (ref 135–145)

## 2018-03-16 LAB — GLUCOSE, CAPILLARY
Glucose-Capillary: 109 mg/dL — ABNORMAL HIGH (ref 70–99)
Glucose-Capillary: 103 mg/dL — ABNORMAL HIGH (ref 70–99)
Glucose-Capillary: 130 mg/dL — ABNORMAL HIGH (ref 70–99)
Glucose-Capillary: 113 mg/dL — ABNORMAL HIGH (ref 70–99)

## 2018-03-16 LAB — TROPONIN I: Troponin I: 0.03 ng/mL (ref <0.03)

## 2018-03-16 MED ORDER — FUROSEMIDE 10 MG/ML IJ SOLN
40.0000 mg | Freq: Two times a day (BID) | INTRAMUSCULAR | Status: DC
  Administered 2018-03-16: 40 mg via INTRAVENOUS
  Filled 2018-03-16: qty 4

## 2018-03-16 MED ORDER — TRAZODONE HCL 50 MG PO TABS
50.0000 mg | ORAL_TABLET | Freq: Once | ORAL | Status: AC
  Administered 2018-03-16: 50 mg via ORAL
  Filled 2018-03-16: qty 1

## 2018-03-16 MED ORDER — ONDANSETRON HCL 4 MG/2ML IJ SOLN
4.0000 mg | Freq: Three times a day (TID) | INTRAMUSCULAR | Status: DC | PRN
  Administered 2018-03-16 – 2018-03-27 (×11): 4 mg via INTRAVENOUS
  Filled 2018-03-16 (×12): qty 2

## 2018-03-16 NOTE — Progress Notes (Signed)
Pt refusing bed alarm.

## 2018-03-16 NOTE — Progress Notes (Deleted)
Cardiology Office Note   Date:  03/16/2018   ID:  Andrea [name], DOB 1950/10/10, MRN [id number]  PCP:  Medicine, [place] [facility]  Cardiologist:  Stanford Breed  No chief complaint on file.    History of Present Illness: Andrea [name] is a 68 y.o. female who presents for ongoing assessment and management of multivessel CAD, with his of 2 DES to the LAD on 12/30/2017 with residual stenosis in the diagonal and oblique marginal branch, not amendable to PCI, with recent admission to [facility] in the setting of acute on chronic systolic CHF, with worsening renal function. She was also found to be in pulmonary edema and was anemic. She required blood transfusion  She is intolerant to Brilinta and has been changed to Plavix. She was seen on consultation by cardiology due to decompensated CHF on 03/05/2018.   Past Medical History:  Diagnosis Date  . Breast cancer ([place])    bilat mastectomy and LUE lymphadenectomy 2014, chemoRx 2014 - 15  . Diabetes mellitus without complication ([place])   . Esophageal reflux   . Hypertension   . Hypothyroid   . Migraine   . Seizure [facility])     Past Surgical History:  Procedure Laterality Date  . ABDOMINAL HYSTERECTOMY     1984 , done for ruptured cyst and endometriosis  . APPENDECTOMY    . CHOLECYSTECTOMY     1980's  . CORONARY STENT INTERVENTION N/A 12/30/2017   Procedure: CORONARY STENT INTERVENTION;  Surgeon: Martinique, Peter M, MD;  Location: [place] [facility];  Service: Cardiovascular;  Laterality: N/A;  . EYE SURGERY  08/28/2015   Right eye  . KNEE SURGERY Left   . MASTECTOMY     bilateral  . Open surgery for bowel obstruction, 2000's    . RIGHT/LEFT HEART CATH AND CORONARY ANGIOGRAPHY N/A 12/29/2017   Procedure: RIGHT/LEFT HEART CATH AND CORONARY ANGIOGRAPHY;  Surgeon: Martinique, Peter M, MD;  Location: [place] [facility];  Service: Cardiovascular;  Laterality: N/A;     No current facility-administered medications for this visit.    No  current outpatient medications on file.   Facility-Administered Medications Ordered in Other Visits  Medication Dose Route Frequency Provider Last Rate Last Dose  . acetaminophen (TYLENOL) tablet 650 mg  650 mg Oral Q6H PRN [name], MD   650 mg at 03/15/18 1509   Or  . acetaminophen (TYLENOL) suppository 650 mg  650 mg Rectal Q6H PRN [name], MD      . oxyCODONE (Oxy IR/ROXICODONE) immediate release tablet 10 mg  10 mg Oral TID PRN [name], MD   10 mg at 03/15/18 2327   And  . acetaminophen (TYLENOL) tablet 325 mg  325 mg Oral TID PRN [name], MD      . albuterol (PROVENTIL) (2.5 MG/3ML) 0.083% nebulizer solution 2.5 mg  2.5 mg Nebulization Q2H PRN [name], MD      . amLODipine (NORVASC) tablet 5 mg  5 mg Oral Daily [name], MD   5 mg at 03/15/18 1625  . atorvastatin (LIPITOR) tablet 80 mg  80 mg Oral QHS [name], MD   80 mg at 03/15/18 2118  . busPIRone (BUSPAR) tablet 15 mg  15 mg Oral BID [name], MD   15 mg at 03/15/18 2118  . carvedilol (COREG) tablet 6.25 mg  6.25 mg Oral BID WC [name], MD   6.25 mg at 03/15/18 1625  . clopidogrel (PLAVIX) tablet 75 mg  75 mg Oral Daily Ghimire,  Dante Gang, MD   75 mg at 03/15/18 1625  . divalproex (DEPAKOTE) DR tablet 1,000 mg  1,000 mg Oral QHS [name], MD   1,000 mg at 03/15/18 2119  . furosemide (LASIX) injection 20 mg  20 mg Intravenous BID [name], MD   20 mg at 03/15/18 1711  . heparin injection 5,000 Units  5,000 Units Subcutaneous Q8H [name], MD   5,000 Units at 03/15/18 2118  . hydrALAZINE (APRESOLINE) tablet 100 mg  100 mg Oral Q8H [name], MD   100 mg at 03/16/18 0603  . insulin aspart (novoLOG) injection 0-5 Units  0-5 Units Subcutaneous QHS [name], MD      . insulin aspart (novoLOG) injection 0-9 Units  0-9 Units Subcutaneous TID WC [name], MD   1 Units at 03/15/18 1713  . insulin glargine (LANTUS) injection 10 Units  10 Units Subcutaneous  QHS [name], MD   10 Units at 03/15/18 2120  . isosorbide mononitrate (IMDUR) 24 hr tablet 30 mg  30 mg Oral Daily [name], MD   30 mg at 03/15/18 1625  . levothyroxine (SYNTHROID, LEVOTHROID) tablet 200 mcg  200 mcg Oral QAC breakfast [name], MD   200 mcg at 03/16/18 0603  . morphine (MS CONTIN) 12 hr tablet 30 mg  30 mg Oral Q12H [name], MD   30 mg at 03/15/18 2118  . ondansetron (ZOFRAN) injection 4 mg  4 mg Intravenous Q8H PRN Lovey Newcomer T, NP   4 mg at 03/16/18 6160  . pregabalin (LYRICA) capsule 75 mg  75 mg Oral BID [name], MD   75 mg at 03/15/18 2119    Allergies:   Ticagrelor; Aspartame and phenylalanine; Aspirin; Maxzide [hydrochlorothiazide w-triamterene]; Metformin and related; Nsaids; Other; Pravachol [pravastatin]; Spironolactone; Stadol [butorphanol]; Toradol [ketorolac tromethamine]; Vistaril [hydroxyzine hcl]; Erythromycin; Morphine and related; and Penicillins    Social History:  The patient  reports that she has never smoked. She has never used smokeless tobacco. She reports that she does not drink alcohol or use drugs.   Family History:  The patient's family history is not on file.    ROS: All other systems are reviewed and negative. Unless otherwise mentioned in H&P    PHYSICAL EXAM: VS:  There were no vitals taken for this visit. , BMI There is no height or weight on file to calculate BMI. GEN: Well nourished, well developed, in no acute distress HEENT: normal Neck: no JVD, carotid bruits, or masses Cardiac: ***RRR; no murmurs, rubs, or gallops,no edema  Respiratory:  Clear to auscultation bilaterally, normal work of breathing GI: soft, nontender, nondistended, + BS MS: no deformity or atrophy Skin: warm and dry, no rash Neuro:  Strength and sensation are intact Psych: euthymic mood, full affect   EKG:  EKG {ACTION; IS/IS VPX:[id number]} ordered today. The ekg ordered today demonstrates ***   Recent Labs: 02/23/2018: TSH  1.838 03/05/2018: Magnesium 1.7 03/15/2018: ALT 15; B Natriuretic Peptide 1,379.9 03/16/2018: BUN 76; Creatinine, Ser 2.49; Hemoglobin 8.1; Platelets 200; Potassium 5.0; Sodium 143    Lipid Panel    Component Value Date/Time   CHOL 96 03/06/2018 0250   TRIG 189 (H) 03/06/2018 0250   HDL 26 (L) 03/06/2018 0250   CHOLHDL 3.7 03/06/2018 0250   VLDL 38 03/06/2018 0250   LDLCALC 32 03/06/2018 0250      Wt Readings from Last 3 Encounters:  03/16/18 164 lb 6.4 oz (74.6 kg)  03/07/18 160 lb 9.6 oz (72.8 kg)  01/28/18 150 lb 12.8 oz (68.4 kg)     Other studies Reviewed:\  Relevant CV Studies: Echocardiogram 03/05/2018  1. The left ventricle has has normal systolic function, with an ejection fraction of 55-60%. There is mildly increased left ventricular wall thickness.  2. The right ventricle has normal systolic function. The cavity was normal.  3. The mitral valve is normal in structure. There is mild thickening. There is mild mitral annular calcification present.  4. The tricuspid valve was normal in structure.  5. The aortic root is normal in size and structure.  6. Limited study for LV function; doppler not performed; normal LV systolic function.  FINDINGS  Left Ventricle: The left ventricle has has normal systolic function, with an ejection fraction of 55-60%. There is mildly increased left ventricular wall thickness. Right Ventricle: The right ventricle has normal systolic function. The cavity was normal. Left Atrium: Left atrial size was normal in size. Right Atrium: Right atrial size was normal in size. Pericardium: There is no evidence of pericardial effusion. Mitral Valve: The mitral valve is normal in structure. There is mild thickening. There is mild mitral annular calcification present. Tricuspid Valve: The tricuspid valve was normal in structure. Aorta: The aortic root is normal in size and structure.   Echocardiogram 12/26/2017 Left ventricle: Apical images sub optimal  due to breast implants. The cavity size was mildly dilated. Wall thickness was increased in a pattern of mild LVH. Systolic function was moderately to severely reduced. The estimated ejection fraction was in the range of 30% to 35%. Diffuse hypokinesis. Doppler parameters are consistent with both elevated ventricular end-diastolic filling pressure and elevated left atrial filling pressure. - Left atrium: The atrium was moderately dilated.  Cardiac Cath 12/30/2017  Prox LAD lesion is 90% stenosed.  A drug-eluting stent was successfully placed using a STENT SYNERGY DES 3X38.  Post intervention, there is 0% residual stenosis.  Dist LAD lesion is 90% stenosed.  A drug-eluting stent was successfully placed using a STENT SYNERGY DES 2.5X16.  Post intervention, there is a 0% residual stenosis.  1. Successful PCI of the proximal and distal LAD with DES x 2.  Plan: since patient is ASA allergic I would recommend Brilinta monotherapy 90 mg bid for one year then 60 mg bid long term. Patient is a candidate for discharge from a cardiac standpoint tomorrow. Her residual CAD will be treated medically.   ASSESSMENT AND PLAN:  1.  ***   Current medicines are reviewed at length with the patient today.    Labs/ tests ordered today include: *** Phill Myron. [place] Pugh, ANP, [facility]   03/16/2018 7:35 AM    [facility] [address] Office [phone] Fax [phone]

## 2018-03-16 NOTE — Progress Notes (Signed)
PROGRESS NOTE    Andrea [name]  FKC:[id number] DOB: Jan 01, 1951 DOA: 03/15/2018 PCP: Medicine, [facility]   Brief Narrative:  68 year old with past medical history relevant for coronary artery disease status post PCI x2 in 12/2017, stage IV CKD hypothyroidism, hypertension, hyperlipidemia, chronic diastolic heart failure, seizure disorder, chronic pain who comes in chest pain as well as coughing and wheezing and found to have acute on chronic diastolic heart failure.   Assessment & Plan:   Active Problems:   Essential hypertension   Insulin dependent diabetes mellitus (HCC)   Hypothyroidism   History of seizures   Esophageal reflux   Acute CHF (congestive heart failure) (HCC)   Chest pain   Cardiomyopathy ([place])   CHF (congestive heart failure) (HCC)   Anemia   GAD (generalized anxiety disorder)   Hyperkalemia   #) Acute on chronic diastolic heart failure/shortness of breath: Patient reportedly had low EF that improved. - Hold home oral furosemide -Continue IV furosemide 40 mg twice daily -Strict ins and outs, weigh daily, sodium restricted diet -Telemetry  #) Chest pain/coronary artery disease three-vessel status post LAD stent x2: Suspect most likely related to heart failure.  Her EKG and troponins are fairly unremarkable.  She is unfortunately not considered to be a candidate for surgery. -Not on aspirin due to allergy -Continue clopidogrel 75 mg daily - Continue isosorbide mononitrate 30 mg daily -Continue hydralazine 1 mg every 8 hours -Continue carvedilol 6.25 mg twice daily -Continue atorvastatin 80 mg daily  -Holding ARB due to waxing waning creatinine, patient was taken off Entresto during prior hospitalization  #) Stage IV CKD: Stable.  Patient received some treatment for hyperkalemia. -Hold nephrotoxins  #) Hypertension/hyperlipidemia: -Continue him amlodipine 5 mg daily -Continue beta-blocker -Continue hydralazine and long-acting  nitrate  #) Type 2 diabetes: - Continue glargine 10 units nightly, on 3550 units at home - Sliding scale insulin, AC at bedtime  #) Hypothyroidism: -Continue levothyroxine 200 mcg daily  #) Pain/psych: -Continue buspirone 15 mg twice daily - Continue long-acting morphine -Continue pregabalin 75 mg twice daily  #) Seizure disorder: -Continue divalproex thousand milligrams nightly  Fluids: Tolerating p.o., restrict Electrolytes: Monitor and supplement Nutrition: Heart healthy carb restricted diet   Prophylaxis: Enoxaparin  Disposition: Pending resolution of heart failure exacerbation  Full code  Consultants:   None  Procedures:   None  Antimicrobials:   None   Subjective: Patient reports that she is still short of breath.  She also reports some chest tightness.  She denies any nausea, vomiting, diarrhea, cough, congestion, rhinorrhea.  Objective: Vitals:   03/15/18 2200 03/15/18 2324 03/16/18 0558 03/16/18 0821  BP: (!) 147/64 (!) 132/53 (!) 175/75 (!) 136/92  Pulse:  88 100 86  Resp:  20 18 18   Temp:  98 F (36.7 C) 98.5 F (36.9 C)   TempSrc:  Oral Oral   SpO2:  98% 99% 98%  Weight:   74.6 kg   Height:        Intake/Output Summary (Last 24 hours) at 03/16/2018 0841 Last data filed at 03/16/2018 0825 Gross per 24 hour  Intake 1110 ml  Output 452 ml  Net 658 ml   Filed Weights   03/15/18 0659 03/15/18 1259 03/16/18 0558  Weight: 72.8 kg 73.3 kg 74.6 kg    Examination:  General exam: Appears calm and comfortable  Respiratory system: No increased work of breathing, diminished lung sounds at bases, no wheezes, crackles, rhonchi Cardiovascular system: Regular rate and rhythm, no murmurs Gastrointestinal  system: Soft, nondistended, no rebound or guarding, plus bowel sounds Central nervous system: Alert and oriented.  Grossly intact, moving all extremities  Extremities: No lower extremity edema Skin: No rashes over visible skin Psychiatry:  Judgement and insight appear normal. Mood & affect appropriate.     Data Reviewed: I have personally reviewed following labs and imaging studies  CBC: Recent Labs  Lab 03/15/18 0728 03/16/18 0221  WBC 6.1 6.3  HGB 8.4* 8.1*  HCT 27.6* 26.5*  MCV 97.5 94.6  PLT 212 354   Basic Metabolic Panel: Recent Labs  Lab 03/15/18 0728 03/16/18 0221  NA 141 143  K 5.8* 5.0  CL 104 105  CO2 27 29  GLUCOSE 177* 95  BUN 80* 76*  CREATININE 2.67* 2.49*  CALCIUM 9.2 9.1   GFR: Estimated Creatinine Clearance: 22 mL/min (A) (by C-G formula based on SCr of 2.49 mg/dL (H)). Liver Function Tests: Recent Labs  Lab 03/15/18 0728  AST 25  ALT 15  ALKPHOS 40  BILITOT 0.4  PROT 6.9  ALBUMIN 3.0*   No results for input(s): LIPASE, AMYLASE in the last 168 hours. No results for input(s): AMMONIA in the last 168 hours. Coagulation Profile: No results for input(s): INR, PROTIME in the last 168 hours. Cardiac Enzymes: Recent Labs  Lab 03/15/18 1402 03/15/18 1948 03/16/18 0221  TROPONINI <0.03 0.03* 0.03*   BNP (last 3 results) No results for input(s): PROBNP in the last 8760 hours. HbA1C: Recent Labs    03/15/18 0728  HGBA1C 6.4*   CBG: Recent Labs  Lab 03/15/18 1558 03/15/18 2116 03/16/18 0736  GLUCAP 149* 112* 109*   Lipid Profile: No results for input(s): CHOL, HDL, LDLCALC, TRIG, CHOLHDL, LDLDIRECT in the last 72 hours. Thyroid Function Tests: No results for input(s): TSH, T4TOTAL, FREET4, T3FREE, THYROIDAB in the last 72 hours. Anemia Panel: No results for input(s): VITAMINB12, FOLATE, FERRITIN, TIBC, IRON, RETICCTPCT in the last 72 hours. Sepsis Labs: No results for input(s): PROCALCITON, LATICACIDVEN in the last 168 hours.  No results found for this or any previous visit (from the past 240 hour(s)).       Radiology Studies: Dg Chest 2 View  Result Date: 03/15/2018 CLINICAL DATA:  Chest pain. EXAM: CHEST - 2 VIEW COMPARISON:  03/04/2018 FINDINGS: Enlarged  cardiac silhouette. Mediastinal contours appear intact. Patchy interstitial and alveolar opacities with lower lobe predominance. Small bilateral pleural effusions can not be excluded. Osseous structures are without acute abnormality. Soft tissues are grossly normal. IMPRESSION: 1. Enlarged cardiac silhouette. 2. Patchy interstitial and alveolar opacities with lower lobe predominance. Findings may represent mixed pattern pulmonary edema or multifocal pneumonia. 3. Small bilateral pleural effusions can not be excluded. Electronically Signed   By: Fidela Salisbury M.D.   On: 03/15/2018 08:57        Scheduled Meds: . amLODipine  5 mg Oral Daily  . atorvastatin  80 mg Oral QHS  . busPIRone  15 mg Oral BID  . carvedilol  6.25 mg Oral BID WC  . clopidogrel  75 mg Oral Daily  . divalproex  1,000 mg Oral QHS  . furosemide  20 mg Intravenous BID  . heparin  5,000 Units Subcutaneous Q8H  . hydrALAZINE  100 mg Oral Q8H  . insulin aspart  0-5 Units Subcutaneous QHS  . insulin aspart  0-9 Units Subcutaneous TID WC  . insulin glargine  10 Units Subcutaneous QHS  . isosorbide mononitrate  30 mg Oral Daily  . levothyroxine  200 mcg Oral QAC  breakfast  . morphine  30 mg Oral Q12H  . pregabalin  75 mg Oral BID   Continuous Infusions:   LOS: 0 days    Time spent: [place], MD [facility]  If 7PM-7AM, please contact night-coverage [email or web address] Password [facility] 03/16/2018, 8:41 AM

## 2018-03-16 NOTE — Telephone Encounter (Signed)
Follow up   Gerald Stabs is calling following up on the orders he needs Please call back

## 2018-03-16 NOTE — Progress Notes (Signed)
RN walks into pt room and pt states she has been throwing up again. Pt is starting to make herself gag. When doing so she spits up small amount of dark green sputum. MD notified. Will continue to monitor.

## 2018-03-17 ENCOUNTER — Inpatient Hospital Stay (HOSPITAL_COMMUNITY): Payer: Medicare Other

## 2018-03-17 ENCOUNTER — Ambulatory Visit: Payer: Medicare Other | Admitting: Adult Health

## 2018-03-17 ENCOUNTER — Observation Stay (HOSPITAL_COMMUNITY): Payer: Medicare Other

## 2018-03-17 DIAGNOSIS — E875 Hyperkalemia: Secondary | ICD-10-CM | POA: Diagnosis present

## 2018-03-17 DIAGNOSIS — Z781 Physical restraint status: Secondary | ICD-10-CM | POA: Diagnosis not present

## 2018-03-17 DIAGNOSIS — I10 Essential (primary) hypertension: Secondary | ICD-10-CM | POA: Diagnosis not present

## 2018-03-17 DIAGNOSIS — E870 Hyperosmolality and hypernatremia: Secondary | ICD-10-CM | POA: Diagnosis not present

## 2018-03-17 DIAGNOSIS — G9341 Metabolic encephalopathy: Secondary | ICD-10-CM

## 2018-03-17 DIAGNOSIS — D649 Anemia, unspecified: Secondary | ICD-10-CM | POA: Diagnosis present

## 2018-03-17 DIAGNOSIS — I5031 Acute diastolic (congestive) heart failure: Secondary | ICD-10-CM | POA: Diagnosis not present

## 2018-03-17 DIAGNOSIS — E876 Hypokalemia: Secondary | ICD-10-CM | POA: Diagnosis not present

## 2018-03-17 DIAGNOSIS — I2510 Atherosclerotic heart disease of native coronary artery without angina pectoris: Secondary | ICD-10-CM | POA: Diagnosis present

## 2018-03-17 DIAGNOSIS — I6782 Cerebral ischemia: Secondary | ICD-10-CM | POA: Diagnosis present

## 2018-03-17 DIAGNOSIS — I5043 Acute on chronic combined systolic (congestive) and diastolic (congestive) heart failure: Secondary | ICD-10-CM | POA: Diagnosis not present

## 2018-03-17 DIAGNOSIS — E039 Hypothyroidism, unspecified: Secondary | ICD-10-CM | POA: Diagnosis present

## 2018-03-17 DIAGNOSIS — K219 Gastro-esophageal reflux disease without esophagitis: Secondary | ICD-10-CM | POA: Diagnosis present

## 2018-03-17 DIAGNOSIS — E1122 Type 2 diabetes mellitus with diabetic chronic kidney disease: Secondary | ICD-10-CM | POA: Diagnosis present

## 2018-03-17 DIAGNOSIS — F411 Generalized anxiety disorder: Secondary | ICD-10-CM | POA: Diagnosis present

## 2018-03-17 DIAGNOSIS — I130 Hypertensive heart and chronic kidney disease with heart failure and stage 1 through stage 4 chronic kidney disease, or unspecified chronic kidney disease: Secondary | ICD-10-CM | POA: Diagnosis present

## 2018-03-17 DIAGNOSIS — N184 Chronic kidney disease, stage 4 (severe): Secondary | ICD-10-CM | POA: Diagnosis present

## 2018-03-17 DIAGNOSIS — I429 Cardiomyopathy, unspecified: Secondary | ICD-10-CM | POA: Diagnosis present

## 2018-03-17 DIAGNOSIS — I255 Ischemic cardiomyopathy: Secondary | ICD-10-CM | POA: Diagnosis not present

## 2018-03-17 DIAGNOSIS — E785 Hyperlipidemia, unspecified: Secondary | ICD-10-CM | POA: Diagnosis present

## 2018-03-17 DIAGNOSIS — G40909 Epilepsy, unspecified, not intractable, without status epilepticus: Secondary | ICD-10-CM | POA: Diagnosis present

## 2018-03-17 DIAGNOSIS — I5033 Acute on chronic diastolic (congestive) heart failure: Secondary | ICD-10-CM | POA: Diagnosis present

## 2018-03-17 DIAGNOSIS — E538 Deficiency of other specified B group vitamins: Secondary | ICD-10-CM | POA: Diagnosis present

## 2018-03-17 DIAGNOSIS — G894 Chronic pain syndrome: Secondary | ICD-10-CM | POA: Diagnosis present

## 2018-03-17 DIAGNOSIS — E1165 Type 2 diabetes mellitus with hyperglycemia: Secondary | ICD-10-CM | POA: Diagnosis not present

## 2018-03-17 DIAGNOSIS — E038 Other specified hypothyroidism: Secondary | ICD-10-CM | POA: Diagnosis not present

## 2018-03-17 DIAGNOSIS — G92 Toxic encephalopathy: Secondary | ICD-10-CM | POA: Diagnosis present

## 2018-03-17 DIAGNOSIS — T402X5A Adverse effect of other opioids, initial encounter: Secondary | ICD-10-CM | POA: Diagnosis present

## 2018-03-17 DIAGNOSIS — G9340 Encephalopathy, unspecified: Secondary | ICD-10-CM | POA: Diagnosis not present

## 2018-03-17 DIAGNOSIS — E860 Dehydration: Secondary | ICD-10-CM | POA: Diagnosis present

## 2018-03-17 DIAGNOSIS — N179 Acute kidney failure, unspecified: Secondary | ICD-10-CM | POA: Diagnosis present

## 2018-03-17 LAB — BLOOD GAS, ARTERIAL
Acid-Base Excess: 4.9 mmol/L — ABNORMAL HIGH (ref 0.0–2.0)
Bicarbonate: 30.1 mmol/L — ABNORMAL HIGH (ref 20.0–28.0)
Drawn by: 27011
O2 Content: 3 L/min
O2 Saturation: 97 %
Patient temperature: 98.6
pCO2 arterial: 55.5 mmHg — ABNORMAL HIGH (ref 32.0–48.0)
pH, Arterial: 7.353 (ref 7.350–7.450)
pO2, Arterial: 83 mmHg (ref 83.0–108.0)

## 2018-03-17 LAB — CBC
HCT: 26.6 % — ABNORMAL LOW (ref 36.0–46.0)
Hemoglobin: 7.7 g/dL — ABNORMAL LOW (ref 12.0–15.0)
MCH: 28.7 pg (ref 26.0–34.0)
MCHC: 28.9 g/dL — ABNORMAL LOW (ref 30.0–36.0)
MCV: 99.3 fL (ref 80.0–100.0)
Platelets: 188 10*3/uL (ref 150–400)
RBC: 2.68 MIL/uL — ABNORMAL LOW (ref 3.87–5.11)
RDW: 13.3 % (ref 11.5–15.5)
WBC: 5 10*3/uL (ref 4.0–10.5)
nRBC: 0 % (ref 0.0–0.2)

## 2018-03-17 LAB — MAGNESIUM: Magnesium: 2.4 mg/dL (ref 1.7–2.4)

## 2018-03-17 LAB — BASIC METABOLIC PANEL
Anion gap: 13 (ref 5–15)
BUN: 85 mg/dL — ABNORMAL HIGH (ref 8–23)
CO2: 26 mmol/L (ref 22–32)
Calcium: 8.7 mg/dL — ABNORMAL LOW (ref 8.9–10.3)
Chloride: 102 mmol/L (ref 98–111)
Creatinine, Ser: 3.16 mg/dL — ABNORMAL HIGH (ref 0.44–1.00)
GFR calc Af Amer: 17 mL/min — ABNORMAL LOW (ref >60)
GFR calc non Af Amer: 15 mL/min — ABNORMAL LOW (ref >60)
Glucose, Bld: 93 mg/dL (ref 70–99)
Sodium: 141 mmol/L (ref 135–145)

## 2018-03-17 LAB — GLUCOSE, CAPILLARY
GLUCOSE-CAPILLARY: 104 mg/dL — AB (ref 70–99)
Glucose-Capillary: 96 mg/dL (ref 70–99)
Glucose-Capillary: 165 mg/dL — ABNORMAL HIGH (ref 70–99)

## 2018-03-17 LAB — BASIC METABOLIC PANEL WITH GFR: Potassium: 5.4 mmol/L — ABNORMAL HIGH (ref 3.5–5.1)

## 2018-03-17 MED ORDER — HALOPERIDOL LACTATE 5 MG/ML IJ SOLN
5.0000 mg | Freq: Four times a day (QID) | INTRAMUSCULAR | Status: DC | PRN
  Administered 2018-03-17 – 2018-03-21 (×3): 5 mg via INTRAMUSCULAR
  Filled 2018-03-17 (×3): qty 1

## 2018-03-17 MED ORDER — HALOPERIDOL LACTATE 5 MG/ML IJ SOLN
INTRAMUSCULAR | Status: AC
  Filled 2018-03-17: qty 1

## 2018-03-17 MED ORDER — HALOPERIDOL LACTATE 5 MG/ML IJ SOLN
2.0000 mg | Freq: Four times a day (QID) | INTRAMUSCULAR | Status: DC | PRN
  Administered 2018-03-17: 2 mg via INTRAMUSCULAR

## 2018-03-17 MED ORDER — SODIUM POLYSTYRENE SULFONATE 15 GM/60ML PO SUSP
45.0000 g | Freq: Once | ORAL | Status: DC
  Filled 2018-03-17: qty 180

## 2018-03-17 MED ORDER — HALOPERIDOL 5 MG PO TABS
5.0000 mg | ORAL_TABLET | Freq: Four times a day (QID) | ORAL | Status: DC | PRN
  Filled 2018-03-17: qty 1

## 2018-03-17 MED ORDER — NALOXONE HCL 0.4 MG/ML IJ SOLN
INTRAMUSCULAR | Status: AC
  Filled 2018-03-17: qty 1

## 2018-03-17 MED ORDER — HALOPERIDOL 2 MG PO TABS
2.0000 mg | ORAL_TABLET | Freq: Four times a day (QID) | ORAL | Status: DC | PRN
  Filled 2018-03-17: qty 1

## 2018-03-17 MED ORDER — QUETIAPINE FUMARATE 25 MG PO TABS
100.0000 mg | ORAL_TABLET | Freq: Every day | ORAL | Status: DC
  Administered 2018-03-17 – 2018-03-18 (×2): 100 mg via ORAL
  Filled 2018-03-17 (×2): qty 4

## 2018-03-17 NOTE — Progress Notes (Signed)
Pt on her way to MRI

## 2018-03-17 NOTE — Progress Notes (Signed)
Pt refusing to have tele monitor on. MD notified. Will continue to monitor.

## 2018-03-17 NOTE — Progress Notes (Signed)
Pt's BP  was 114/46 this morning paged [facility] and received orders to hold the hydralazine for this morning.

## 2018-03-17 NOTE — Progress Notes (Signed)
PROGRESS NOTE    Andrea [name]  KXF:[id number] DOB: November 13, 1950 DOA: 03/15/2018 PCP: Medicine, [facility]   Brief Narrative:  68 year old with past medical history relevant for coronary artery disease status post PCI x2 in 12/2017, stage IV CKD hypothyroidism, hypertension, hyperlipidemia, chronic diastolic heart failure, seizure disorder, chronic pain who comes in chest pain as well as coughing and wheezing and found to have acute on chronic diastolic heart failure.  Her course has been complicated by encephalopathy of unclear etiology.  Assessment & Plan:   Active Problems:   Essential hypertension   Insulin dependent diabetes mellitus (HCC)   Hypothyroidism   History of seizures   Esophageal reflux   Acute CHF (congestive heart failure) (HCC)   Chest pain   Cardiomyopathy (HCC)   CHF (congestive heart failure) (HCC)   Anemia   GAD (generalized anxiety disorder)   Hyperkalemia  #) Encephalopathy: Patient today is quite encephalopathic and is repeating her name.  She is not participating in physical exam.  She does not have any localizing signs or symptoms. -MRI brain ordered -Hold long-acting morphine and pregabalin   #) Acute on chronic diastolic heart failure/shortness of breath: This appears to be improved. - Hold home oral furosemide at this time -Strict ins and outs, weigh daily, sodium restricted diet -Telemetry  #) Chest pain/coronary artery disease three-vessel status post LAD stent x2: Suspect most likely related to heart failure.  Her EKG and troponins are fairly unremarkable.  She is unfortunately not considered to be a candidate for surgery. -Not on aspirin due to allergy -Continue clopidogrel 75 mg daily - Continue isosorbide mononitrate 30 mg daily -Continue hydralazine 100 mg every 8 hours -Continue carvedilol 6.25 mg twice daily -Continue atorvastatin 80 mg daily  -Holding ARB due to waxing waning creatinine, patient was taken off  Entresto during prior hospitalization  #) Stage IV CKD: Stable.   -Hold nephrotoxins  #) Hypertension/hyperlipidemia: -Continue him amlodipine 5 mg daily -Continue beta-blocker -Continue hydralazine and long-acting nitrate  #) Type 2 diabetes: - Continue glargine 10 units nightly, on 3550 units at home - Sliding scale insulin, AC at bedtime  #) Hypothyroidism: -Continue levothyroxine 200 mcg daily -We will recheck TSH  #) Pain/psych: -Continue buspirone 15 mg twice daily - Hold long-acting morphine -Hold pregabalin 75 mg twice daily  #) Seizure disorder: -Continue divalproex thousand milligrams nightly  Fluids: Tolerating p.o., restrict Electrolytes: Monitor and supplement Nutrition: Heart healthy carb restricted diet   Prophylaxis: Subcu heparin  Disposition: Pending improvement in mental status  Full code  Consultants:   None  Procedures:   None  Antimicrobials:   None   Subjective: Patient is much more altered this morning.  She does not have any complaints but keeps repeating the name Andrea [name] again and again.  She does not respond to commands.  Objective: Vitals:   03/17/18 0156 03/17/18 0445 03/17/18 0618 03/17/18 0639  BP: (!) 129/51  (!) 114/46 120/87  Pulse: 85  82 85  Resp: 16   18  Temp: 99.2 F (37.3 C)   100 F (37.8 C)  TempSrc: Oral   Oral  SpO2: 98%  97% 96%  Weight:  72.1 kg    Height:        Intake/Output Summary (Last 24 hours) at 03/17/2018 0935 Last data filed at 03/16/2018 2029 Gross per 24 hour  Intake 360 ml  Output -  Net 360 ml   Filed Weights   03/15/18 1259 03/16/18 0558 03/17/18 0445  Weight:  73.3 kg 74.6 kg 72.1 kg    Examination:  General exam: Mildly agitated Respiratory system: No increased work of breathing, diminished lung sounds at bases, no wheezes, crackles, rhonchi Cardiovascular system: Regular rate and rhythm, no murmurs Gastrointestinal system: Soft, nondistended, no rebound or guarding, plus  bowel sounds Central nervous system: Alert but not oriented, only repeats name again and again.  Echolalia.  Moves extremities to commands but no focal weakness. Extremities: No lower extremity edema Skin: No rashes over visible skin Psychiatry: Unable to assess due to medical condition    Data Reviewed: I have personally reviewed following labs and imaging studies  CBC: Recent Labs  Lab 03/15/18 0728 03/16/18 0221 03/17/18 0434  WBC 6.1 6.3 5.0  HGB 8.4* 8.1* 7.7*  HCT 27.6* 26.5* 26.6*  MCV 97.5 94.6 99.3  PLT 212 200 671   Basic Metabolic Panel: Recent Labs  Lab 03/15/18 0728 03/16/18 0221 03/17/18 0434  NA 141 143 141  K 5.8* 5.0 5.4*  CL 104 105 102  CO2 27 29 26   GLUCOSE 177* 95 93  BUN 80* 76* 85*  CREATININE 2.67* 2.49* 3.16*  CALCIUM 9.2 9.1 8.7*  MG  --   --  2.4   GFR: Estimated Creatinine Clearance: 17.1 mL/min (A) (by C-G formula based on SCr of 3.16 mg/dL (H)). Liver Function Tests: Recent Labs  Lab 03/15/18 0728  AST 25  ALT 15  ALKPHOS 40  BILITOT 0.4  PROT 6.9  ALBUMIN 3.0*   No results for input(s): LIPASE, AMYLASE in the last 168 hours. No results for input(s): AMMONIA in the last 168 hours. Coagulation Profile: No results for input(s): INR, PROTIME in the last 168 hours. Cardiac Enzymes: Recent Labs  Lab 03/15/18 1402 03/15/18 1948 03/16/18 0221  TROPONINI <0.03 0.03* 0.03*   BNP (last 3 results) No results for input(s): PROBNP in the last 8760 hours. HbA1C: Recent Labs    03/15/18 0728  HGBA1C 6.4*   CBG: Recent Labs  Lab 03/16/18 0736 03/16/18 1137 03/16/18 1505 03/16/18 2204 03/17/18 0640  GLUCAP 109* 103* 130* 113* 96   Lipid Profile: No results for input(s): CHOL, HDL, LDLCALC, TRIG, CHOLHDL, LDLDIRECT in the last 72 hours. Thyroid Function Tests: No results for input(s): TSH, T4TOTAL, FREET4, T3FREE, THYROIDAB in the last 72 hours. Anemia Panel: No results for input(s): VITAMINB12, FOLATE, FERRITIN, TIBC,  IRON, RETICCTPCT in the last 72 hours. Sepsis Labs: No results for input(s): PROCALCITON, LATICACIDVEN in the last 168 hours.  No results found for this or any previous visit (from the past 240 hour(s)).       Radiology Studies: Dg Chest Port 1 View  Result Date: 03/17/2018 CLINICAL DATA:  Hypoxia. EXAM: PORTABLE CHEST 1 VIEW COMPARISON:  03/15/2018 FINDINGS: There is moderate cardiac enlargement. Small pleural effusions and moderate interstitial edema is identified. No airspace consolidation. The visualized osseous structures are unremarkable. IMPRESSION: 1. Congestive heart failure.  Unchanged from previous exam. Electronically Signed   By: Kerby Moors M.D.   On: 03/17/2018 08:18        Scheduled Meds: . amLODipine  5 mg Oral Daily  . atorvastatin  80 mg Oral QHS  . busPIRone  15 mg Oral BID  . carvedilol  6.25 mg Oral BID WC  . clopidogrel  75 mg Oral Daily  . divalproex  1,000 mg Oral QHS  . heparin  5,000 Units Subcutaneous Q8H  . hydrALAZINE  100 mg Oral Q8H  . insulin aspart  0-5 Units Subcutaneous QHS  .  insulin aspart  0-9 Units Subcutaneous TID WC  . insulin glargine  10 Units Subcutaneous QHS  . isosorbide mononitrate  30 mg Oral Daily  . levothyroxine  200 mcg Oral QAC breakfast  . sodium polystyrene  45 g Oral Once   Continuous Infusions:   LOS: 0 days    Time spent: [place], MD [facility]  If 7PM-7AM, please contact night-coverage [email or web address] Password [facility] 03/17/2018, 9:35 AM

## 2018-03-17 NOTE — Progress Notes (Signed)
RN walks in room with pt trying to get out of bed, completely naked. Pt ripping tele monitor off. Pt has no IV access.MD notified. Will continue to monitor.

## 2018-03-17 NOTE — Progress Notes (Addendum)
Pt has arrived back to 3e12. Pt vitals are stable. BP is 162/74. MD notified Pt is currently alert and orientedx2-3. Will continue to monitor.

## 2018-03-17 NOTE — Progress Notes (Signed)
Pt unable to do MRI due to AMS, pt crawling out of scanner.  Pt very aggressive with all staff members, throwing denture cup with her jewelry towards me and grabbing our tops to pull [place] closer and calling us crazy, etc..Marland KitchenMultiple attempts were made to get images.

## 2018-03-17 NOTE — Significant Event (Addendum)
Rapid Response Event Note  Overview: Time Called: 1202 Arrival Time: 1205 Event Type: Neurologic  Initial Focused Assessment: RN called because difficult arousing patient.   BP 171/76  HR 93  RR 20s  O2 sat 98% rectal temp 98.9 Lung sounds coarse, decreased bases Patient responsive to painful stimuli,  Repeats the same a word or phrase when she speaks. She quickly falls back asleep.  Pupils are pinpoint.    Interventions: 0.4mg  Narcan given IV Patient became more responsive and restless. Still repeating a word or phrase when she speaks.  She is intermittently calm and relaxed lying in the bed then awakes ands pulls off her gown and cardiac telemetry .  She pulled out her IV.  Haldol for agitation Head CT done  1745  MRI attempted per RN, pt unable to tolerate. Still intermittently calm then very agitated attempting to get out of bed and pulling of gown and cardiac telemetry. Dr Herbert Moors at bedside to assess patient. IM haldol Safety sitter  Plan of Care (if not transferred): RN to call if assistance needed  Event Summary: Name of Physician Notified: Thomes Dinning by Hinton Dyer at 1220    at    Outcome: Stayed in room and stabalized  Event End Time: [place]  Raliegh Ip

## 2018-03-17 NOTE — Progress Notes (Signed)
RN tried to give pt medication with applesauce. Pt kept spitting out applesauce. Pt was not able to take some medications including her BP medications. MD notified. Will continue to monitor.

## 2018-03-18 DIAGNOSIS — I5043 Acute on chronic combined systolic (congestive) and diastolic (congestive) heart failure: Secondary | ICD-10-CM

## 2018-03-18 DIAGNOSIS — G9340 Encephalopathy, unspecified: Secondary | ICD-10-CM

## 2018-03-18 LAB — GLUCOSE, CAPILLARY
Glucose-Capillary: 117 mg/dL — ABNORMAL HIGH (ref 70–99)
Glucose-Capillary: 109 mg/dL — ABNORMAL HIGH (ref 70–99)
Glucose-Capillary: 116 mg/dL — ABNORMAL HIGH (ref 70–99)
Glucose-Capillary: 86 mg/dL (ref 70–99)
Glucose-Capillary: 88 mg/dL (ref 70–99)

## 2018-03-18 LAB — RENAL FUNCTION PANEL
Albumin: 2.9 g/dL — ABNORMAL LOW (ref 3.5–5.0)
Anion gap: 10 (ref 5–15)
BUN: 95 mg/dL — ABNORMAL HIGH (ref 8–23)
CO2: 30 mmol/L (ref 22–32)
Calcium: 8.5 mg/dL — ABNORMAL LOW (ref 8.9–10.3)
Chloride: 104 mmol/L (ref 98–111)
Creatinine, Ser: 2.97 mg/dL — ABNORMAL HIGH (ref 0.44–1.00)
GFR calc Af Amer: 18 mL/min — ABNORMAL LOW (ref >60)
GFR calc non Af Amer: 16 mL/min — ABNORMAL LOW (ref >60)
Glucose, Bld: 114 mg/dL — ABNORMAL HIGH (ref 70–99)
Phosphorus: 4.3 mg/dL (ref 2.5–4.6)
Potassium: 5.4 mmol/L — ABNORMAL HIGH (ref 3.5–5.1)
Sodium: 144 mmol/L (ref 135–145)

## 2018-03-18 LAB — CBC
HCT: 26.2 % — ABNORMAL LOW (ref 36.0–46.0)
Hemoglobin: 8.1 g/dL — ABNORMAL LOW (ref 12.0–15.0)
MCH: 30.2 pg (ref 26.0–34.0)
MCHC: 30.9 g/dL (ref 30.0–36.0)
MCV: 97.8 fL (ref 80.0–100.0)
Platelets: 215 10*3/uL (ref 150–400)
RBC: 2.68 MIL/uL — ABNORMAL LOW (ref 3.87–5.11)
RDW: 13 % (ref 11.5–15.5)
WBC: 4.9 10*3/uL (ref 4.0–10.5)
nRBC: 0.6 % — ABNORMAL HIGH (ref 0.0–0.2)

## 2018-03-18 LAB — HEPATIC FUNCTION PANEL
ALT: 17 U/L (ref 0–44)
AST: 36 U/L (ref 15–41)
Albumin: 3 g/dL — ABNORMAL LOW (ref 3.5–5.0)
Alkaline Phosphatase: 35 U/L — ABNORMAL LOW (ref 38–126)
Bilirubin, Direct: 0.2 mg/dL (ref 0.0–0.2)
Indirect Bilirubin: 0.4 mg/dL (ref 0.3–0.9)
Total Bilirubin: 0.6 mg/dL (ref 0.3–1.2)
Total Protein: 7.1 g/dL (ref 6.5–8.1)

## 2018-03-18 LAB — MAGNESIUM: Magnesium: 2.5 mg/dL — ABNORMAL HIGH (ref 1.7–2.4)

## 2018-03-18 LAB — TSH: TSH: 1.937 u[IU]/mL (ref 0.350–4.500)

## 2018-03-18 LAB — AMMONIA: Ammonia: 31 umol/L (ref 9–35)

## 2018-03-18 MED ORDER — HYDRALAZINE HCL 20 MG/ML IJ SOLN
5.0000 mg | INTRAMUSCULAR | Status: DC | PRN
  Administered 2018-03-20 – 2018-03-22 (×8): 5 mg via INTRAVENOUS
  Filled 2018-03-18 (×8): qty 1

## 2018-03-18 MED ORDER — LABETALOL HCL 5 MG/ML IV SOLN
5.0000 mg | INTRAVENOUS | Status: DC | PRN
  Administered 2018-03-21 (×2): 5 mg via INTRAVENOUS
  Filled 2018-03-18 (×2): qty 4

## 2018-03-18 MED ORDER — LORAZEPAM 2 MG/ML IJ SOLN
0.5000 mg | Freq: Once | INTRAMUSCULAR | Status: AC
  Administered 2018-03-18: 0.5 mg via INTRAVENOUS

## 2018-03-18 MED ORDER — LORAZEPAM 2 MG/ML IJ SOLN
1.0000 mg | Freq: Once | INTRAMUSCULAR | Status: DC
  Filled 2018-03-18: qty 1

## 2018-03-18 NOTE — Progress Notes (Signed)
Responded to PIV consult. Pt in restraints. Pt cooperative with assessment. PIV obtained in right cephalic by [place] with good blood return. While dressing PIV, pt jerked arm and attempted to pinch and grab. PIV became malpositioned during this action.

## 2018-03-18 NOTE — Progress Notes (Signed)
Pt is being combative. Pt is more pale then she was before. Rapid called, MD called. Will continue to monitor.

## 2018-03-18 NOTE — Progress Notes (Signed)
Pt continue to be combative with member of staff. MD paged. Orders received for soft wrist and soft ankle restraints. Restraints applied safely and correctly. Will continue to monitor

## 2018-03-18 NOTE — Telephone Encounter (Signed)
Can not through, fast busy

## 2018-03-18 NOTE — Significant Event (Signed)
Rapid Response Event Note  Overview: Neurologic -- Agitation -- Ongoing Issue  Initial Focused Assessment: Upon arrival, patient was agitated, violent, in four point restraints. Per RN was pale, VSS. Able to answer simple questions and follows simple commands but then quickly get irritated and agitated. No focal neuro symptoms (no one sided weakness or sensory loss or dysarthria). Blood sugar was normal.   Interventions: -- RN paged [facility] NP and administered Haldol 5mg  IM.   Plan of Care: -- RN to follow up with primary service provider  Event Summary:  Call Time  [place]  Andrea [name] R

## 2018-03-18 NOTE — Plan of Care (Signed)
  Problem: Education: Goal: Knowledge of General Education information will improve Description Including pain rating scale, medication(s)/side effects and non-pharmacologic comfort measures Outcome: Not Met (add Reason)   Problem: Education: Goal: Ability to demonstrate management of disease process will improve Outcome: Not Met (add Reason) Goal: Ability to verbalize understanding of medication therapies will improve Outcome: Not Met (add Reason) Goal: Individualized Educational Video(s) Outcome: Not Met (add Reason)   Problem: Activity: Goal: Capacity to carry out activities will improve Outcome: Not Met (add Reason)   Problem: Cardiac: Goal: Ability to achieve and maintain adequate cardiopulmonary perfusion will improve Outcome: Not Met (add Reason)

## 2018-03-18 NOTE — Progress Notes (Addendum)
PROGRESS NOTE    Andrea [name]  BMW:[id number] DOB: 20-Jun-1950 DOA: 03/15/2018 PCP: Medicine, [facility]     Brief Narrative:  Andrea [name] is a 68 year old with past medical history relevant for coronary artery disease status post PCI x2 in 12/2017, stage IV CKD hypothyroidism, hypertension, hyperlipidemia, chronic diastolic heart failure, seizure disorder, chronic pain who comes in chest pain as well as coughing and wheezing and found to have acute on chronic diastolic heart failure. Her course has been complicated by encephalopathy.   New events last 24 hours / Subjective: Overnight, patient has been very combative, agitated, violent.  She was placed in four-point restraints.  This morning, patient remains somnolent.   Spoke with son over the phone, he states that patient's mentation at baseline is normal, patient is fully conversational.  However, he states that over the past 15 years, she has "these episodes" at least once a week.  He attributes it to her being overly medicated.  She gets her opiate medications from pain clinic.  He states that she "becomes like a zombie" these medications.  Apparently in her previous hospitalizations, she had medications in her purse while inpatient.  Addendum: Checked in on her this afternoon. She remains in restraints. She is alert to voice, not oriented, denies pain.   Assessment & Plan:   Principal Problem:   Encephalopathy Active Problems:   Essential hypertension   Insulin dependent diabetes mellitus (HCC)   Hypothyroidism   History of seizures   Esophageal reflux   Acute CHF (congestive heart failure) (HCC)   Chest pain   Cardiomyopathy (HCC)   CHF (congestive heart failure) (HCC)   Anemia   GAD (generalized anxiety disorder)   Hyperkalemia   Acute toxic encephalopathy -CT head, MRI brain without acute abnormality, did reveal chronic ischemic microangiopathy -Ammonia normal -Could be secondary to  chronic opioid dependence, became somnolent, then received Narcan.  Patient may be agitated, combative secondary to withdrawal -COWS ordered.  Once patient becomes more alert and oriented, may read assume some of her home pain medications at a lower dose  Acute on chronic diastolic heart failure -BNP 1379.9 -Does not appear to be grossly fluid overloaded on exam today, respiratory status stable for now -Holding lasix in setting of AKI   Chest pain -Initially complaining of chest pain, troponin negative x3 -Continue Plavix, Imdur  AKI on chronic kidney disease stage IV -Baseline creatinine 2.4  -Avoid nephrotoxins  -Monitor BMP   Hypertension -Continue amlodipine, Coreg, hydralazine  Hyperlipidemia -Continue atorvastatin  Hypothyroidism -TSH within normal -Continue Synthroid  Type 2 diabetes, well controlled -Continue Lantus, sliding scale insulin  Chronic pain syndrome -Currently holding her long-acting morphine, may need to resume at a lower dose  Depression/anxiety -Continue buspirone  Seizure disorder -Continue Depakote -Valproic acid level normal  Mild hyperkalemia -Unable to administer Kayexalate due to patient's mentation   DVT prophylaxis: Subcutaneous heparin Code Status: Full code Family Communication: Spoke with son over the phone Disposition Plan: Pending improvement in her encephalopathy   Consultants:   None  Procedures:   None  Antimicrobials:  Anti-infectives (From admission, onward)   None        Objective: Vitals:   03/18/18 0335 03/18/18 0546 03/18/18 0636 03/18/18 0913  BP: (!) 163/94 (!) 128/54  (!) 179/107  Pulse: 92 85  90  Resp: 14   16  Temp:  97.7 F (36.5 C)  97.9 F (36.6 C)  TempSrc:  Axillary  Axillary  SpO2: 92% 95% 96%  95%  Weight:      Height:        Intake/Output Summary (Last 24 hours) at 03/18/2018 1321 Last data filed at 03/18/2018 1000 Gross per 24 hour  Intake 180 ml  Output -  Net 180 ml    Filed Weights   03/15/18 1259 03/16/18 0558 03/17/18 0445  Weight: 73.3 kg 74.6 kg 72.1 kg    Examination:  General exam: Appears calm and comfortable, somnolent, 4 point restraints in place  Respiratory system: Clear to auscultation anteriorly  Cardiovascular system: S1 & S2 heard, RRR. No pedal edema. Gastrointestinal system: Abdomen is nondistended, soft and nontender. No organomegaly or masses felt. Normal bowel sounds heard. Central nervous system: Somnolent  Extremities: Symmetric Skin: No rashes, lesions or ulcers  Data Reviewed: I have personally reviewed following labs and imaging studies  CBC: Recent Labs  Lab 03/15/18 0728 03/16/18 0221 03/17/18 0434 03/18/18 0530  WBC 6.1 6.3 5.0 4.9  HGB 8.4* 8.1* 7.7* 8.1*  HCT 27.6* 26.5* 26.6* 26.2*  MCV 97.5 94.6 99.3 97.8  PLT 212 200 188 169   Basic Metabolic Panel: Recent Labs  Lab 03/15/18 0728 03/16/18 0221 03/17/18 0434 03/18/18 0530  NA 141 143 141 144  K 5.8* 5.0 5.4* 5.4*  CL 104 105 102 104  CO2 27 29 26 30   GLUCOSE 177* 95 93 114*  BUN 80* 76* 85* 95*  CREATININE 2.67* 2.49* 3.16* 2.97*  CALCIUM 9.2 9.1 8.7* 8.5*  MG  --   --  2.4 2.5*  PHOS  --   --   --  4.3   GFR: Estimated Creatinine Clearance: 18.1 mL/min (A) (by C-G formula based on SCr of 2.97 mg/dL (H)). Liver Function Tests: Recent Labs  Lab 03/15/18 0728 03/18/18 0530 03/18/18 0817  AST 25  --  36  ALT 15  --  17  ALKPHOS 40  --  35*  BILITOT 0.4  --  0.6  PROT 6.9  --  7.1  ALBUMIN 3.0* 2.9* 3.0*   No results for input(s): LIPASE, AMYLASE in the last 168 hours. Recent Labs  Lab 03/18/18 0817  AMMONIA 31   Coagulation Profile: No results for input(s): INR, PROTIME in the last 168 hours. Cardiac Enzymes: Recent Labs  Lab 03/15/18 1402 03/15/18 1948 03/16/18 0221  TROPONINI <0.03 0.03* 0.03*   BNP (last 3 results) No results for input(s): PROBNP in the last 8760 hours. HbA1C: No results for input(s): HGBA1C in the  last 72 hours. CBG: Recent Labs  Lab 03/17/18 1136 03/17/18 2132 03/18/18 0333 03/18/18 0651 03/18/18 1213  GLUCAP 104* 165* 117* 109* 116*   Lipid Profile: No results for input(s): CHOL, HDL, LDLCALC, TRIG, CHOLHDL, LDLDIRECT in the last 72 hours. Thyroid Function Tests: Recent Labs    03/18/18 0530  TSH 1.937   Anemia Panel: No results for input(s): VITAMINB12, FOLATE, FERRITIN, TIBC, IRON, RETICCTPCT in the last 72 hours. Sepsis Labs: No results for input(s): PROCALCITON, LATICACIDVEN in the last 168 hours.  No results found for this or any previous visit (from the past 240 hour(s)).     Radiology Studies: Ct Head Wo Contrast  Result Date: 03/17/2018 CLINICAL DATA:  Altered mental status EXAM: CT HEAD WITHOUT CONTRAST TECHNIQUE: Contiguous axial images were obtained from the base of the skull through the vertex without intravenous contrast. COMPARISON:  06/04/2017 FINDINGS: Brain: No mass, hemorrhage or extra-axial collection. There are multiple old lacunar infarcts of the basal ganglia. There is generalized hypoattenuation of the periventricular  white matter consistent with small vessel ischemia. Gray-white differentiation is maintained. Vascular: Atherosclerotic calcification of the internal carotid arteries at the skull base. No abnormal hyperdensity of the major intracranial arteries or dural venous sinuses. Skull: The visualized skull base, calvarium and extracranial soft tissues are normal. Sinuses/Orbits: Small sphenoid sinus retention cyst. No mastoid or middle ear effusion. The orbits are normal. IMPRESSION: 1. No acute intracranial abnormality. 2. 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 Electronically Signed   By: Ulyses Jarred M.D.   On: 03/17/2018 15:09   Mr Brain Wo Contrast  Result Date: 03/17/2018 CLINICAL DATA:  Encephalopathy, altered mental status. History of hypertension, hyperlipidemia, breast cancer,  migraine and seizure. EXAM: MRI HEAD WITHOUT CONTRAST TECHNIQUE: Multiplanar, multiecho pulse sequences of the brain and surrounding structures were obtained without intravenous contrast. Sagittal T1 sequence not obtained due to patient motion. COMPARISON:  CT HEAD March 17, 2018 FINDINGS: Moderately motion degraded examination. INTRACRANIAL CONTENTS: No reduced diffusion to suggest acute ischemia. No susceptibility artifact to suggest lobar hematoma; motion degrades detection of microhemorrhages. Old bilateral basal ganglia and RIGHT thalamus small infarcts with mild ex vacuo dilatation subjacent ventricle. Mild parenchymal brain volume loss. No hydrocephalus. Patchy supratentorial white matter FLAIR T2 hyperintensities. No suspicious parenchymal signal, masses, mass effect. No abnormal extra-axial fluid collections. No extra-axial masses. VASCULAR: Normal major intracranial vascular flow voids present at skull base. SKULL AND UPPER CERVICAL SPINE: No abnormal sellar expansion. No suspicious calvarial bone marrow signal. Craniocervical junction maintained. SINUSES/ORBITS: Trace RIGHT mastoid effusion. Included ocular globes and orbital contents are non-suspicious. Status post bilateral ocular lens implants. OTHER: None. IMPRESSION: 1. No acute intracranial process on this motion degraded examination. 2. 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 Mild chronic small vessel ischemic changes. 3. Mild parenchymal brain volume loss for age. Electronically Signed   By: Elon Alas M.D.   On: 03/17/2018 23:00   Dg Chest Port 1 View  Result Date: 03/17/2018 CLINICAL DATA:  Hypoxia. EXAM: PORTABLE CHEST 1 VIEW COMPARISON:  03/15/2018 FINDINGS: There is moderate cardiac enlargement. Small pleural effusions and moderate interstitial edema is identified. No airspace consolidation. The visualized osseous structures are unremarkable. IMPRESSION: 1. Congestive heart failure.  Unchanged from previous exam.  Electronically Signed   By: Kerby Moors M.D.   On: 03/17/2018 08:18      Scheduled Meds: . amLODipine  5 mg Oral Daily  . atorvastatin  80 mg Oral QHS  . busPIRone  15 mg Oral BID  . carvedilol  6.25 mg Oral BID WC  . clopidogrel  75 mg Oral Daily  . divalproex  1,000 mg Oral QHS  . heparin  5,000 Units Subcutaneous Q8H  . hydrALAZINE  100 mg Oral Q8H  . insulin aspart  0-5 Units Subcutaneous QHS  . insulin aspart  0-9 Units Subcutaneous TID WC  . insulin glargine  10 Units Subcutaneous QHS  . isosorbide mononitrate  30 mg Oral Daily  . levothyroxine  200 mcg Oral QAC breakfast  . LORazepam  1 mg Intravenous Once  . QUEtiapine  100 mg Oral QHS  . sodium polystyrene  45 g Oral Once   Continuous Infusions:   LOS: 1 day    Time spent: 45 minutes   Dessa Phi, DO [facility] [email or web address] 03/18/2018, 1:21 PM

## 2018-03-18 NOTE — Progress Notes (Signed)
Pt trying to get out of restraints and pulling off her oxygen. Pt stating "eveyrone is trying to hurt me". Pt educated that we are here to help and care. Will continue to monitor.

## 2018-03-18 NOTE — Progress Notes (Signed)
Patient agitated and pulling line and tele, hitting her feet on the foot of the bed. Medication given and patient asleep resting comfortably. Tentative to remove restraints and patient became agitated and pulling lines. Four points restraints in place at this time. Checked on patient frequently and offered drink, patient refused to drink or eat. Will continue to monitor patient.

## 2018-03-18 NOTE — Progress Notes (Signed)
Rn went to check on pt during hourly rounds. Pt was trying to grab the posey belt, mitts, and tele monitor off. RN tried to give pt pain medication per request of patient, but pt started hitting and kicking staff once we got in close contact with pt to put equipment back on. RN called additional staff/security to room. PT starting hitting and kicking other staff and calling everyone "black bitches". Pt repositioned for comfort. Pose belt re applied. MD notified. Will continue to monitor.

## 2018-03-19 ENCOUNTER — Inpatient Hospital Stay (HOSPITAL_COMMUNITY): Payer: Medicare Other

## 2018-03-19 DIAGNOSIS — G9340 Encephalopathy, unspecified: Secondary | ICD-10-CM

## 2018-03-19 LAB — GLUCOSE, CAPILLARY
Glucose-Capillary: 125 mg/dL — ABNORMAL HIGH (ref 70–99)
Glucose-Capillary: 130 mg/dL — ABNORMAL HIGH (ref 70–99)
Glucose-Capillary: 183 mg/dL — ABNORMAL HIGH (ref 70–99)
Glucose-Capillary: 200 mg/dL — ABNORMAL HIGH (ref 70–99)

## 2018-03-19 LAB — BASIC METABOLIC PANEL
Anion gap: 13 (ref 5–15)
BUN: 98 mg/dL — ABNORMAL HIGH (ref 8–23)
CO2: 24 mmol/L (ref 22–32)
CREATININE: 2.39 mg/dL — AB (ref 0.44–1.00)
Calcium: 8.9 mg/dL (ref 8.9–10.3)
Chloride: 108 mmol/L (ref 98–111)
GFR calc Af Amer: 24 mL/min — ABNORMAL LOW (ref >60)
GFR calc non Af Amer: 20 mL/min — ABNORMAL LOW (ref >60)
Glucose, Bld: 124 mg/dL — ABNORMAL HIGH (ref 70–99)
Potassium: 5.3 mmol/L — ABNORMAL HIGH (ref 3.5–5.1)
Sodium: 145 mmol/L (ref 135–145)

## 2018-03-19 LAB — CBC
HEMATOCRIT: 30.1 % — AB (ref 36.0–46.0)
Hemoglobin: 9 g/dL — ABNORMAL LOW (ref 12.0–15.0)
MCH: 29 pg (ref 26.0–34.0)
MCHC: 29.9 g/dL — ABNORMAL LOW (ref 30.0–36.0)
MCV: 97.1 fL (ref 80.0–100.0)
Platelets: 181 10*3/uL (ref 150–400)
RBC: 3.1 MIL/uL — ABNORMAL LOW (ref 3.87–5.11)
RDW: 12.8 % (ref 11.5–15.5)
WBC: 6.2 10*3/uL (ref 4.0–10.5)
nRBC: 0 % (ref 0.0–0.2)

## 2018-03-19 LAB — VALPROIC ACID LEVEL: Valproic Acid Lvl: 17 ug/mL — ABNORMAL LOW (ref 50.0–100.0)

## 2018-03-19 MED ORDER — CARVEDILOL 6.25 MG PO TABS
6.2500 mg | ORAL_TABLET | Freq: Two times a day (BID) | ORAL | Status: DC
  Administered 2018-03-19 (×2): 6.25 mg via ORAL
  Filled 2018-03-19 (×2): qty 1

## 2018-03-19 MED ORDER — VALPROATE SODIUM 500 MG/5ML IV SOLN
250.0000 mg | Freq: Four times a day (QID) | INTRAVENOUS | Status: DC
  Administered 2018-03-19 – 2018-03-23 (×16): 250 mg via INTRAVENOUS
  Filled 2018-03-19 (×17): qty 2.5

## 2018-03-19 MED ORDER — MORPHINE SULFATE ER 30 MG PO TBCR
30.0000 mg | EXTENDED_RELEASE_TABLET | Freq: Two times a day (BID) | ORAL | Status: DC
  Administered 2018-03-19: 30 mg via ORAL
  Filled 2018-03-19 (×2): qty 1

## 2018-03-19 MED ORDER — QUETIAPINE FUMARATE 25 MG PO TABS
50.0000 mg | ORAL_TABLET | Freq: Every day | ORAL | Status: DC
  Administered 2018-03-19: 50 mg via ORAL
  Filled 2018-03-19: qty 2

## 2018-03-19 MED ORDER — VALPROATE SODIUM 500 MG/5ML IV SOLN: 100.0000 mg | Freq: Every day | INTRAVENOUS | Status: DC

## 2018-03-19 MED ORDER — SODIUM CHLORIDE 0.9 % IV SOLN
INTRAVENOUS | Status: DC | PRN
  Administered 2018-03-19 (×2): 250 mL via INTRAVENOUS

## 2018-03-19 NOTE — Telephone Encounter (Signed)
Unable to get through on the number provisded

## 2018-03-19 NOTE — Procedures (Signed)
ELECTROENCEPHALOGRAM REPORT   Patient: Andrea [name]       Room #: 6W25R EEG No. ID: [id number] Age: 68 y.o.        Sex: female Referring Physician: Maylene Roes Report Date:  03/19/2018        Interpreting Physician: Alexis Goodell  History: Andrea [name] is an 68 y.o. female with altered mental status  Medications:  Norvasc, Lipitor, Buspar, Coreg, Plavix, Apresoline, Insulin, Imdur, Synthroid, MS Contin, Seroquel, Depacon  Conditions of Recording:  This is a 21 channel routine scalp EEG performed with bipolar and monopolar montages arranged in accordance to the international 10/20 system of electrode placement. One channel was dedicated to EKG recording.  The patient is in the confused and uncooperative state.  Description:  The background activity is disorganized bilaterally.  It consists of a mixture of frequencies with slower frequencies being most predominant and diffusely distributed.  Over the left hemisphere is noted frequent left temporoparietal sharp transients of triphasic morphology.  Hyperventilation and intermittent photic stimulation were not performed.   IMPRESSION: This is an abnormal electroencephlaogram secondary to sharp waves over the left hemisphere.     Alexis Goodell, MD Neurology [phone] 03/19/2018, 1:04 PM

## 2018-03-19 NOTE — Plan of Care (Signed)
  Problem: Education: Goal: Ability to demonstrate management of disease process will improve Outcome: Progressing   Problem: Activity: Goal: Capacity to carry out activities will improve Outcome: Progressing   Problem: Clinical Measurements: Goal: Respiratory complications will improve Outcome: Progressing   Problem: Skin Integrity: Goal: Risk for impaired skin integrity will decrease Outcome: Progressing   Problem: Safety: Goal: Ability to remain free from injury will improve Outcome: Progressing

## 2018-03-19 NOTE — Progress Notes (Signed)
PROGRESS NOTE    Andrea [name]  GQQ:[id number] DOB: 1950-05-29 DOA: 03/15/2018 PCP: Medicine, [facility]     Brief Narrative:  Andrea [name] is a 68 year old with past medical history relevant for coronary artery disease status post PCI x2 in 12/2017, stage IV CKD hypothyroidism, hypertension, hyperlipidemia, chronic diastolic heart failure, seizure disorder, chronic pain who comes in chest pain as well as coughing and wheezing and found to have acute on chronic diastolic heart failure. Her course has been complicated by encephalopathy.   New events last 24 hours / Subjective: Overnight, nursing attempted to remove patient's four-point restraints, however, she became agitated once again and was placed back on four-point restraints.  On my examination today, she remains alert, not oriented to place or time.  However, she is able to tell me her full name as well as her son's name.  She is able to follow some commands but not all.  This is not her baseline per report from her son as well as nursing staff who have seen her during her previous hospitalization.  Assessment & Plan:   Principal Problem:   Encephalopathy Active Problems:   Essential hypertension   Insulin dependent diabetes mellitus (HCC)   Hypothyroidism   History of seizures   Esophageal reflux   Acute CHF (congestive heart failure) (HCC)   Chest pain   Cardiomyopathy (HCC)   CHF (congestive heart failure) (HCC)   Anemia   GAD (generalized anxiety disorder)   Hyperkalemia   Acute toxic encephalopathy -CT head, MRI brain without acute abnormality, did reveal chronic ischemic microangiopathy -Ammonia normal -Could be secondary to chronic opioid dependence, became somnolent, then received Narcan.  Patient may be agitated, combative secondary to withdrawal.  COWS ordered -EEG obtained, revealed sharp waves over left hemisphere -Discussed with neurology, appreciate consultation  Acute on  chronic diastolic heart failure -BNP 1379.9 -Does not appear to be grossly fluid overloaded on exam today, respiratory status stable for now -Holding lasix in setting of AKI   Chest pain -Initially complaining of chest pain, troponin negative x3 -Continue Plavix, Imdur  AKI on chronic kidney disease stage IV -Baseline creatinine 2.4  -Avoid nephrotoxins  -Back to baseline, creatinine 2.39 today  Hypertension -Continue amlodipine, Coreg, hydralazine  Hyperlipidemia -Continue atorvastatin  Hypothyroidism -TSH within normal -Continue Synthroid  Type 2 diabetes, well controlled -Continue Lantus, sliding scale insulin  Chronic pain syndrome -Resume her chronic morphine dosing  Depression/anxiety -Continue buspirone  Seizure disorder -Continue Depakote, changed to IV -Valproic acid level normal  Mild hyperkalemia -Unable to administer Kayexalate due to patient's mentation   DVT prophylaxis: Subcutaneous heparin Code Status: Full code Family Communication: Spoke with son over the phone Disposition Plan: Pending improvement in her encephalopathy   Consultants:   Neurology  Procedures:   None  Antimicrobials:  Anti-infectives (From admission, onward)   None       Objective: Vitals:   03/18/18 2027 03/19/18 0648 03/19/18 1132 03/19/18 1153  BP: (!) 171/102 (!) 173/90 (!) 187/79   Pulse: 96 95 93   Resp: 16 18 19    Temp: (!) 97.5 F (36.4 C) (!) 97.3 F (36.3 C)  (!) 97.3 F (36.3 C)  TempSrc:  Oral  Oral  SpO2: 95% 93% 94%   Weight:  69.9 kg    Height:        Intake/Output Summary (Last 24 hours) at 03/19/2018 1456 Last data filed at 03/19/2018 1319 Gross per 24 hour  Intake 83.29 ml  Output 600 ml  Net -516.71 ml   Filed Weights   03/16/18 0558 03/17/18 0445 03/19/18 0648  Weight: 74.6 kg 72.1 kg 69.9 kg    Examination: General exam: Appears calm and comfortable  Respiratory system: Clear to auscultation. Respiratory effort  normal. Cardiovascular system: S1 & S2 heard, RRR. No JVD, murmurs, rubs, gallops or clicks. No pedal edema. Gastrointestinal system: Abdomen is nondistended, soft and nontender. No organomegaly or masses felt. Normal bowel sounds heard. Central nervous system: Alert and oriented to self only.  Moves all 4 extremities spontaneously, also on command.  Mumbles some words incoherently, does not engage in full conversation which is not at her baseline Extremities: Symmetric Skin: No rashes, lesions or ulcers   Data Reviewed: I have personally reviewed following labs and imaging studies  CBC: Recent Labs  Lab 03/15/18 0728 03/16/18 0221 03/17/18 0434 03/18/18 0530 03/19/18 0626  WBC 6.1 6.3 5.0 4.9 6.2  HGB 8.4* 8.1* 7.7* 8.1* 9.0*  HCT 27.6* 26.5* 26.6* 26.2* 30.1*  MCV 97.5 94.6 99.3 97.8 97.1  PLT 212 200 188 215 353   Basic Metabolic Panel: Recent Labs  Lab 03/15/18 0728 03/16/18 0221 03/17/18 0434 03/18/18 0530 03/19/18 0626  NA 141 143 141 144 145  K 5.8* 5.0 5.4* 5.4* 5.3*  CL 104 105 102 104 108  CO2 27 29 26 30 24   GLUCOSE 177* 95 93 114* 124*  BUN 80* 76* 85* 95* 98*  CREATININE 2.67* 2.49* 3.16* 2.97* 2.39*  CALCIUM 9.2 9.1 8.7* 8.5* 8.9  MG  --   --  2.4 2.5*  --   PHOS  --   --   --  4.3  --    GFR: Estimated Creatinine Clearance: 22.2 mL/min (A) (by C-G formula based on SCr of 2.39 mg/dL (H)). Liver Function Tests: Recent Labs  Lab 03/15/18 0728 03/18/18 0530 03/18/18 0817  AST 25  --  36  ALT 15  --  17  ALKPHOS 40  --  35*  BILITOT 0.4  --  0.6  PROT 6.9  --  7.1  ALBUMIN 3.0* 2.9* 3.0*   No results for input(s): LIPASE, AMYLASE in the last 168 hours. Recent Labs  Lab 03/18/18 0817  AMMONIA 31   Coagulation Profile: No results for input(s): INR, PROTIME in the last 168 hours. Cardiac Enzymes: Recent Labs  Lab 03/15/18 1402 03/15/18 1948 03/16/18 0221  TROPONINI <0.03 0.03* 0.03*   BNP (last 3 results) No results for input(s):  PROBNP in the last 8760 hours. HbA1C: No results for input(s): HGBA1C in the last 72 hours. CBG: Recent Labs  Lab 03/18/18 1213 03/18/18 1717 03/18/18 2208 03/19/18 0644 03/19/18 1131  GLUCAP 116* 86 88 125* 130*   Lipid Profile: No results for input(s): CHOL, HDL, LDLCALC, TRIG, CHOLHDL, LDLDIRECT in the last 72 hours. Thyroid Function Tests: Recent Labs    03/18/18 0530  TSH 1.937   Anemia Panel: No results for input(s): VITAMINB12, FOLATE, FERRITIN, TIBC, IRON, RETICCTPCT in the last 72 hours. Sepsis Labs: No results for input(s): PROCALCITON, LATICACIDVEN in the last 168 hours.  No results found for this or any previous visit (from the past 240 hour(s)).     Radiology Studies: Ct Head Wo Contrast  Result Date: 03/17/2018 CLINICAL DATA:  Altered mental status EXAM: CT HEAD WITHOUT CONTRAST TECHNIQUE: Contiguous axial images were obtained from the base of the skull through the vertex without intravenous contrast. COMPARISON:  06/04/2017 FINDINGS: Brain: No mass, hemorrhage or extra-axial collection. There are multiple old  lacunar infarcts of the basal ganglia. There is generalized hypoattenuation of the periventricular white matter consistent with small vessel ischemia. Gray-white differentiation is maintained. Vascular: Atherosclerotic calcification of the internal carotid arteries at the skull base. No abnormal hyperdensity of the major intracranial arteries or dural venous sinuses. Skull: The visualized skull base, calvarium and extracranial soft tissues are normal. Sinuses/Orbits: Small sphenoid sinus retention cyst. No mastoid or middle ear effusion. The orbits are normal. IMPRESSION: 1. No acute intracranial abnormality. 2. 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 Electronically Signed   By: Ulyses Jarred M.D.   On: 03/17/2018 15:09   Mr Brain Wo Contrast  Result Date: 03/17/2018 CLINICAL DATA:  Encephalopathy, altered  mental status. History of hypertension, hyperlipidemia, breast cancer, migraine and seizure. EXAM: MRI HEAD WITHOUT CONTRAST TECHNIQUE: Multiplanar, multiecho pulse sequences of the brain and surrounding structures were obtained without intravenous contrast. Sagittal T1 sequence not obtained due to patient motion. COMPARISON:  CT HEAD March 17, 2018 FINDINGS: Moderately motion degraded examination. INTRACRANIAL CONTENTS: No reduced diffusion to suggest acute ischemia. No susceptibility artifact to suggest lobar hematoma; motion degrades detection of microhemorrhages. Old bilateral basal ganglia and RIGHT thalamus small infarcts with mild ex vacuo dilatation subjacent ventricle. Mild parenchymal brain volume loss. No hydrocephalus. Patchy supratentorial white matter FLAIR T2 hyperintensities. No suspicious parenchymal signal, masses, mass effect. No abnormal extra-axial fluid collections. No extra-axial masses. VASCULAR: Normal major intracranial vascular flow voids present at skull base. SKULL AND UPPER CERVICAL SPINE: No abnormal sellar expansion. No suspicious calvarial bone marrow signal. Craniocervical junction maintained. SINUSES/ORBITS: Trace RIGHT mastoid effusion. Included ocular globes and orbital contents are non-suspicious. Status post bilateral ocular lens implants. OTHER: None. IMPRESSION: 1. No acute intracranial process on this motion degraded examination. 2. 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 Mild chronic small vessel ischemic changes. 3. Mild parenchymal brain volume loss for age. Electronically Signed   By: Elon Alas M.D.   On: 03/17/2018 23:00      Scheduled Meds: . amLODipine  5 mg Oral Daily  . atorvastatin  80 mg Oral QHS  . busPIRone  15 mg Oral BID  . carvedilol  6.25 mg Oral BID WC  . clopidogrel  75 mg Oral Daily  . heparin  5,000 Units Subcutaneous Q8H  . hydrALAZINE  100 mg Oral Q8H  . insulin aspart  0-5 Units Subcutaneous QHS  . insulin aspart  0-9  Units Subcutaneous TID WC  . insulin glargine  10 Units Subcutaneous QHS  . isosorbide mononitrate  30 mg Oral Daily  . levothyroxine  200 mcg Oral QAC breakfast  . LORazepam  1 mg Intravenous Once  . morphine  30 mg Oral BID  . QUEtiapine  50 mg Oral QHS   Continuous Infusions: . sodium chloride Stopped (03/19/18 1256)  . valproate sodium Stopped (03/19/18 1252)     LOS: 2 days    Time spent: 15 minutes   Dessa Phi, DO [facility] [email or web address] 03/19/2018, 2:56 PM

## 2018-03-19 NOTE — Progress Notes (Signed)
LTM EEG started 

## 2018-03-19 NOTE — Progress Notes (Signed)
RN attempted to remove 4-point non-violent soft restraints and apply lap belt after RN and NT bathed patient early this AM. Pt educated that this is an attempt to downgrade restraints and provide more patient independence. Pt very quickly began to pull at telemetry, external female catheter, and nasal cannula. Pt also began to exit the bed over the left lower side rail. RN re-entered patient's room while bed alarm ringing. Pt placed back in 4-point non-violent soft restraints. Will continue to monitor.

## 2018-03-19 NOTE — Progress Notes (Signed)
Bedside EEG completed, results pending,

## 2018-03-19 NOTE — Consult Note (Addendum)
Neurology Consultation  Reason for Consult: Altered mental status Referring Physician: Dr. Maylene Roes    History is obtained from: Chart  HPI: Andrea [name] is a 68 y.o. female with history of seizure, migraines, hypothyroidism, hypertension, diabetes and breast cancer.  Patient was initially brought into the hospital for shortness of breath and chest pain.  Patient has been presented to the hospital multiple times.  Her last admission this month was with congestive heart failure exacerbation and fluctuation of renal functions.  When she was admitted to the hospital she apparently woke up at 6 with sudden onset and persistent severe left pericardial chest pain stating it felt like an elephant was walking on her chest.  During hospitalization she has been encephalopathic.  Over the last 24 hours nursing attempted to remove four-point restraints but she has been agitated recently and again became agitated thus needed four-point restraints.  On exam and consultation today, she is in four-point restraints and is noticeable that she has bruises around the four-point restraints.  She perseverates on the last word that has been stated.  She does not follow commands and appears to have shortness of breath although she is on 2 L nasal cannula.  Patient does not appear to have any lateralizing abnormalities and no significant increased tone.  Phone call was made to family members who states that she does take narcotics and at times has taken too many and or when she takes narcotics can present like this.  She is on Depakote, BuSpar, morphine, oxycodone, Lyrica which all can affect patient's awareness and can affect sedation.  Last valproic acid level was done 4 days ago and was 54.     ROS: Unable to obtain due to altered mental status.   Past Medical History:  Diagnosis Date  . Breast cancer ([place])    bilat mastectomy and LUE lymphadenectomy 2014, chemoRx 2014 - 15  . Diabetes mellitus without  complication ([place])   . Esophageal reflux   . Hypertension   . Hypothyroid   . Migraine   . Seizure [facility])     Family History  Problem Relation Age of Onset  . Sudden Cardiac Death Neg Hx    Social History:   reports that she has never smoked. She has never used smokeless tobacco. She reports that she does not drink alcohol or use drugs.  Medications  Current Facility-Administered Medications:  .  0.9 %  sodium chloride infusion, , Intravenous, PRN, Dessa Phi, DO, Stopped at 03/19/18 1256 .  acetaminophen (TYLENOL) tablet 650 mg, 650 mg, Oral, Q6H PRN, 650 mg at 03/15/18 1509 **OR** acetaminophen (TYLENOL) suppository 650 mg, 650 mg, Rectal, Q6H PRN, [name], MD .  albuterol (PROVENTIL) (2.5 MG/3ML) 0.083% nebulizer solution 2.5 mg, 2.5 mg, Nebulization, Q2H PRN, [name], MD .  amLODipine (NORVASC) tablet 5 mg, 5 mg, Oral, Daily, Ghimire, Kuber, MD, 5 mg at 03/19/18 1157 .  atorvastatin (LIPITOR) tablet 80 mg, 80 mg, Oral, QHS, Ghimire, Dante Gang, MD, 80 mg at 03/17/18 2122 .  busPIRone (BUSPAR) tablet 15 mg, 15 mg, Oral, BID, [name], MD, 15 mg at 03/19/18 1158 .  carvedilol (COREG) tablet 6.25 mg, 6.25 mg, Oral, BID WC, Dessa Phi, DO, 6.25 mg at 03/19/18 1157 .  clopidogrel (PLAVIX) tablet 75 mg, 75 mg, Oral, Daily, Ghimire, Dante Gang, MD, 75 mg at 03/19/18 1157 .  haloperidol (HALDOL) tablet 5 mg, 5 mg, Oral, Q6H PRN **OR** haloperidol lactate (HALDOL) injection 5 mg, 5 mg, Intramuscular, Q6H PRN, Purohit, Shrey C,  MD, 5 mg at 03/18/18 0335 .  heparin injection 5,000 Units, 5,000 Units, Subcutaneous, Q8H, [name], MD, 5,000 Units at 03/19/18 1502 .  hydrALAZINE (APRESOLINE) injection 5 mg, 5 mg, Intravenous, Q4H PRN, Dessa Phi, DO .  hydrALAZINE (APRESOLINE) tablet 100 mg, 100 mg, Oral, Q8H, Ghimire, Kuber, MD, 100 mg at 03/19/18 1502 .  insulin aspart (novoLOG) injection 0-5 Units, 0-5 Units, Subcutaneous, QHS, Ghimire, Kuber, MD .  insulin aspart  (novoLOG) injection 0-9 Units, 0-9 Units, Subcutaneous, TID WC, [name], MD, 1 Units at 03/19/18 1217 .  insulin glargine (LANTUS) injection 10 Units, 10 Units, Subcutaneous, QHS, [name], MD, 10 Units at 03/17/18 2123 .  isosorbide mononitrate (IMDUR) 24 hr tablet 30 mg, 30 mg, Oral, Daily, Ghimire, Kuber, MD, 30 mg at 03/19/18 1158 .  labetalol (NORMODYNE,TRANDATE) injection 5 mg, 5 mg, Intravenous, Q2H PRN, Dessa Phi, DO .  levothyroxine (SYNTHROID, LEVOTHROID) tablet 200 mcg, 200 mcg, Oral, QAC breakfast, [name], MD, 200 mcg at 03/18/18 0950 .  LORazepam (ATIVAN) injection 1 mg, 1 mg, Intravenous, Once, Schorr, Rhetta Mura, NP .  morphine (MS CONTIN) 12 hr tablet 30 mg, 30 mg, Oral, BID, Dessa Phi, DO, 30 mg at 03/19/18 1157 .  ondansetron (ZOFRAN) injection 4 mg, 4 mg, Intravenous, Q8H PRN, Blount, Xenia T, NP, 4 mg at 03/16/18 0347 .  QUEtiapine (SEROQUEL) tablet 50 mg, 50 mg, Oral, QHS, Dessa Phi, DO .  valproate (DEPACON) 250 mg in dextrose 5 % 50 mL IVPB, 250 mg, Intravenous, Q6H, Tyrone Apple, RPH, Stopped at 03/19/18 1252   Exam: Current vital signs: BP (!) 172/85   Pulse 84   Temp (!) 97.3 F (36.3 C) (Oral)   Resp 19   Ht 5\' 4"  (1.626 m)   Wt 69.9 kg   SpO2 94%   BMI 26.43 kg/m  Vital signs in last 24 hours: Temp:  [97.3 F (36.3 C)-98.2 F (36.8 C)] 97.3 F (36.3 C) (02/27 1153) Pulse Rate:  [84-96] 84 (02/27 1457) Resp:  [16-20] 19 (02/27 1132) BP: (151-187)/(76-102) 172/85 (02/27 1457) SpO2:  [93 %-96 %] 94 % (02/27 1132) Weight:  [69.9 kg] 69.9 kg (02/27 0648)  Physical Exam  Constitutional: Appears well-developed and well-nourished.  Psych: Very lethargic continues to go back to sleep Eyes: No scleral injection HENT: No OP obstrucion Head: Normocephalic.  Cardiovascular: Normal rate and regular rhythm.  Respiratory: Effort normal, non-labored breathing GI: Soft.  No distension. There is no tenderness.  Skin:  WDI  Neuro: Mental Status: Patient is on 2 L of oxygen, very lethargic, does awaken to voice, when asked where she is she perseverates on what ever word was last said.  Does not follow commands Cranial Nerves: II: Blinks to threat bilaterally III,IV, VI: Doll's intact. Pupils are equal, round, and reactive to light.   V: Winces to pain to noxious stimuli VII: Facial movement is symmetric.    Motor: All extremities have normal tone and bulk with no increased tone on either extremity Sensory: Minimally moans to noxious stimuli in all 4 extremities Deep Tendon Reflexes: 2+ bilateral upper extremities with 1+ at knee jerk with cross adductors Plantars: Downgoing bilaterally Cerebellar: Unable to assess  Labs I have reviewed labs in epic and the results pertinent to this consultation are:   CBC    Component Value Date/Time   WBC 6.2 03/19/2018 0626   RBC 3.10 (L) 03/19/2018 0626   HGB 9.0 (L) 03/19/2018 0626   HGB 10.8 (L) 01/28/2018 1022  HCT 30.1 (L) 03/19/2018 0626   HCT 32.0 (L) 01/28/2018 1022   PLT 181 03/19/2018 0626   PLT 277 01/28/2018 1022   MCV 97.1 03/19/2018 0626   MCV 90 01/28/2018 1022   MCH 29.0 03/19/2018 0626   MCHC 29.9 (L) 03/19/2018 0626   RDW 12.8 03/19/2018 0626   RDW 12.6 01/28/2018 1022   LYMPHSABS 2.0 03/05/2018 0500   LYMPHSABS 2.6 01/08/2018 1153   MONOABS 0.5 03/05/2018 0500   EOSABS 0.2 03/05/2018 0500   EOSABS 0.4 01/08/2018 1153   BASOSABS 0.0 03/05/2018 0500   BASOSABS 0.0 01/08/2018 1153    CMP     Component Value Date/Time   NA 145 03/19/2018 0626   NA 137 01/28/2018 1022   K 5.3 (H) 03/19/2018 0626   CL 108 03/19/2018 0626   CO2 24 03/19/2018 0626   GLUCOSE 124 (H) 03/19/2018 0626   BUN 98 (H) 03/19/2018 0626   BUN 30 (H) 01/28/2018 1022   CREATININE 2.39 (H) 03/19/2018 0626   CALCIUM 8.9 03/19/2018 0626   PROT 7.1 03/18/2018 0817   ALBUMIN 3.0 (L) 03/18/2018 0817   AST 36 03/18/2018 0817   ALT 17 03/18/2018 0817    ALKPHOS 35 (L) 03/18/2018 0817   BILITOT 0.6 03/18/2018 0817   GFRNONAA 20 (L) 03/19/2018 0626   GFRAA 24 (L) 03/19/2018 0626    Lipid Panel     Component Value Date/Time   CHOL 96 03/06/2018 0250   TRIG 189 (H) 03/06/2018 0250   HDL 26 (L) 03/06/2018 0250   CHOLHDL 3.7 03/06/2018 0250   VLDL 38 03/06/2018 0250   LDLCALC 32 03/06/2018 0250     Imaging I have reviewed the images obtained:  CT-scan of the brain-no acute intracranial abnormalities.  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  During hospitalization remained encephalopathic and had agitation.  Patient is on multiple medications that can cause sedation along with decreased renal function this could very well be the cause of much of her sedation and encephalopathy.  However do due to history of seizure cannot rule out possibility of seizure.   Impression: -Encephalopathy -Possible seizure  Recommendations: -Correct any and all metabolic abnormalities -We will hook patient up to LTM overnight  Attending addendum Patient seen and examined Agree with the above. Imaging reviewed personally. We will follow.  -- Amie Portland, MD [facility] Pager: [phone] If 7pm to 7am, please call on call as listed on AMION.  CRITICAL CARE ATTESTATION Performed by: Amie Portland, MD Total critical care time: 30 minutes Critical care time was exclusive of separately billable procedures and treating other patients and/or supervising APPs/Residents/Students Critical care was necessary to treat or prevent imminent or  life-threatening deterioration due to toxic metabolic encephalopathy, possible status of lupus. This patient is critically ill and at significant risk for neurological worsening and/or death and care requires constant monitoring. Critical care was time spent personally by me on the following activities: development of treatment plan with patient and/or surrogate as well as nursing, discussions with consultants, evaluation of patient's response to treatment, examination of patient, obtaining history from patient or surrogate, ordering and performing  treatments and interventions, ordering and review of laboratory studies, ordering and review of radiographic studies, pulse oximetry, re-evaluation of patient's condition, participation in multidisciplinary rounds and medical decision making of high complexity in the care of this patient.

## 2018-03-20 LAB — BASIC METABOLIC PANEL
Anion gap: 11 (ref 5–15)
BUN: 104 mg/dL — ABNORMAL HIGH (ref 8–23)
CO2: 28 mmol/L (ref 22–32)
Calcium: 8.7 mg/dL — ABNORMAL LOW (ref 8.9–10.3)
Chloride: 108 mmol/L (ref 98–111)
Creatinine, Ser: 2.09 mg/dL — ABNORMAL HIGH (ref 0.44–1.00)
GFR calc Af Amer: 28 mL/min — ABNORMAL LOW (ref >60)
GFR calc non Af Amer: 24 mL/min — ABNORMAL LOW (ref >60)
Glucose, Bld: 206 mg/dL — ABNORMAL HIGH (ref 70–99)
Potassium: 4.4 mmol/L (ref 3.5–5.1)
Sodium: 147 mmol/L — ABNORMAL HIGH (ref 135–145)

## 2018-03-20 LAB — GLUCOSE, CAPILLARY
Glucose-Capillary: 153 mg/dL — ABNORMAL HIGH (ref 70–99)
Glucose-Capillary: 166 mg/dL — ABNORMAL HIGH (ref 70–99)
Glucose-Capillary: 170 mg/dL — ABNORMAL HIGH (ref 70–99)
Glucose-Capillary: 178 mg/dL — ABNORMAL HIGH (ref 70–99)
Glucose-Capillary: 187 mg/dL — ABNORMAL HIGH (ref 70–99)

## 2018-03-20 MED ORDER — AMLODIPINE BESYLATE 10 MG PO TABS
10.0000 mg | ORAL_TABLET | Freq: Every day | ORAL | Status: DC
  Administered 2018-03-22 – 2018-03-27 (×6): 10 mg via ORAL
  Filled 2018-03-20 (×7): qty 1

## 2018-03-20 MED ORDER — MORPHINE SULFATE ER 15 MG PO TBCR: 15.0000 mg | EXTENDED_RELEASE_TABLET | Freq: Two times a day (BID) | ORAL | Status: DC

## 2018-03-20 MED ORDER — FUROSEMIDE 10 MG/ML IJ SOLN
40.0000 mg | Freq: Once | INTRAMUSCULAR | Status: AC
  Administered 2018-03-20: 40 mg via INTRAVENOUS
  Filled 2018-03-20: qty 4

## 2018-03-20 MED ORDER — VALPROATE SODIUM 500 MG/5ML IV SOLN
500.0000 mg | Freq: Once | INTRAVENOUS | Status: AC
  Administered 2018-03-20: 500 mg via INTRAVENOUS
  Filled 2018-03-20: qty 5

## 2018-03-20 MED ORDER — CARVEDILOL 12.5 MG PO TABS
12.5000 mg | ORAL_TABLET | Freq: Two times a day (BID) | ORAL | Status: DC
  Filled 2018-03-20: qty 1

## 2018-03-20 NOTE — Progress Notes (Signed)
Patient has been fast asleep all morning.  Four point restraints taken off, will monitor patient. Marcille Blanco, RN

## 2018-03-20 NOTE — Consult Note (Deleted)
Neurology Progress Note   S:// Patient seen and examined.  Remains encephalopathic. Spot EEG with concern for sharp waves over the left hemisphere. Overnight LTM EEG with no evidence of seizure.  Sharp triphasic waves consistent with severe metabolic encephalopathy seen in both hemispheres.  O:// Current vital signs: BP (!) 185/82 (BP Location: Right Wrist)   Pulse 77   Temp (!) 97.5 F (36.4 C) (Oral)   Resp 14   Ht 5\' 4"  (1.626 m)   Wt 69.8 kg   SpO2 95%   BMI 26.41 kg/m  Vital signs in last 24 hours: Temp:  [97.5 F (36.4 C)] 97.5 F (36.4 C) (02/28 0500) Pulse Rate:  [77-84] 77 (02/28 1122) Resp:  [14-22] 14 (02/28 1122) BP: (168-185)/(74-85) 185/82 (02/28 1122) SpO2:  [90 %-95 %] 95 % (02/28 1122) Weight:  [69.8 kg-70.2 kg] 69.8 kg (02/28 0500) General: Awake, alert, slow to respond to questions HEENT: Normocephalic atraumatic CVs: H2-C9 heard regular rate rhythm Respiratory: Chest clear to auscultation Extremities: Trace edema Neurological exam Patient is awake, alert, oriented to self.  Not oriented to place or time. Bradyphrenic No dysarthria.  Naming intact, follows simple commands, repeats some sentences incompletely. Poor attention concentration Cranial nerves: Pupils equal round react light, extraocular movements intact, visual fields full, face symmetric, facial sensation intact, tongue midline, palate midline. Motor exam: Normal tone in all 4 extremities, is able to offer some resistance to individual muscle testing but all 4 limbs drop to the bed when trying to keep them up. Sensory exam: Intact light touch all over with minimal moaning to noxious stimulation in all 4 extremities. Cerebellar: Difficult to perform due to inattention   Medications  Current Facility-Administered Medications:  .  0.9 %  sodium chloride infusion, , Intravenous, PRN, Dessa Phi, DO, Last Rate: 10 mL/hr at 03/19/18 1735, 250 mL at 03/19/18 1735 .  acetaminophen (TYLENOL)  tablet 650 mg, 650 mg, Oral, Q6H PRN, 650 mg at 03/15/18 1509 **OR** acetaminophen (TYLENOL) suppository 650 mg, 650 mg, Rectal, Q6H PRN, [name], MD .  albuterol (PROVENTIL) (2.5 MG/3ML) 0.083% nebulizer solution 2.5 mg, 2.5 mg, Nebulization, Q2H PRN, [name], MD .  amLODipine (NORVASC) tablet 10 mg, 10 mg, Oral, Daily, Dessa Phi, DO .  atorvastatin (LIPITOR) tablet 80 mg, 80 mg, Oral, QHS, Ghimire, Dante Gang, MD, 80 mg at 03/17/18 2122 .  busPIRone (BUSPAR) tablet 15 mg, 15 mg, Oral, BID, [name], MD, 15 mg at 03/19/18 2307 .  carvedilol (COREG) tablet 12.5 mg, 12.5 mg, Oral, BID WC, Dessa Phi, DO .  clopidogrel (PLAVIX) tablet 75 mg, 75 mg, Oral, Daily, Ghimire, Kuber, MD, 75 mg at 03/19/18 1157 .  haloperidol (HALDOL) tablet 5 mg, 5 mg, Oral, Q6H PRN **OR** haloperidol lactate (HALDOL) injection 5 mg, 5 mg, Intramuscular, Q6H PRN, Purohit, Shrey C, MD, 5 mg at 03/18/18 0335 .  heparin injection 5,000 Units, 5,000 Units, Subcutaneous, Q8H, Ghimire, Dante Gang, MD, 5,000 Units at 03/20/18 0600 .  hydrALAZINE (APRESOLINE) injection 5 mg, 5 mg, Intravenous, Q4H PRN, Dessa Phi, DO, 5 mg at 03/20/18 1212 .  hydrALAZINE (APRESOLINE) tablet 100 mg, 100 mg, Oral, Q8H, Ghimire, Kuber, MD, 100 mg at 03/19/18 2258 .  insulin aspart (novoLOG) injection 0-5 Units, 0-5 Units, Subcutaneous, QHS, Ghimire, Kuber, MD .  insulin aspart (novoLOG) injection 0-9 Units, 0-9 Units, Subcutaneous, TID WC, [name], MD, 2 Units at 03/20/18 1216 .  insulin glargine (LANTUS) injection 10 Units, 10 Units, Subcutaneous, QHS, Ghimire, Kuber, MD, 10 Units at  03/20/18 0100 .  isosorbide mononitrate (IMDUR) 24 hr tablet 30 mg, 30 mg, Oral, Daily, Ghimire, Kuber, MD, 30 mg at 03/19/18 1158 .  labetalol (NORMODYNE,TRANDATE) injection 5 mg, 5 mg, Intravenous, Q2H PRN, Dessa Phi, DO .  levothyroxine (SYNTHROID, LEVOTHROID) tablet 200 mcg, 200 mcg, Oral, QAC breakfast, [name], MD, 200 mcg at  03/18/18 0950 .  LORazepam (ATIVAN) injection 1 mg, 1 mg, Intravenous, Once, Schorr, Rhetta Mura, NP .  morphine (MS CONTIN) 12 hr tablet 30 mg, 30 mg, Oral, BID, Dessa Phi, DO, 30 mg at 03/19/18 1157 .  ondansetron (ZOFRAN) injection 4 mg, 4 mg, Intravenous, Q8H PRN, Blount, Xenia T, NP, 4 mg at 03/16/18 0737 .  QUEtiapine (SEROQUEL) tablet 50 mg, 50 mg, Oral, QHS, Dessa Phi, DO, 50 mg at 03/19/18 2313 .  valproate (DEPACON) 250 mg in dextrose 5 % 50 mL IVPB, 250 mg, Intravenous, Q6H, Tyrone Apple, RPH, Last Rate: 52.5 mL/hr at 03/20/18 1219, 250 mg at 03/20/18 1219 Labs CBC    Component Value Date/Time   WBC 6.2 03/19/2018 0626   RBC 3.10 (L) 03/19/2018 0626   HGB 9.0 (L) 03/19/2018 0626   HGB 10.8 (L) 01/28/2018 1022   HCT 30.1 (L) 03/19/2018 0626   HCT 32.0 (L) 01/28/2018 1022   PLT 181 03/19/2018 0626   PLT 277 01/28/2018 1022   MCV 97.1 03/19/2018 0626   MCV 90 01/28/2018 1022   MCH 29.0 03/19/2018 0626   MCHC 29.9 (L) 03/19/2018 0626   RDW 12.8 03/19/2018 0626   RDW 12.6 01/28/2018 1022   LYMPHSABS 2.0 03/05/2018 0500   LYMPHSABS 2.6 01/08/2018 1153   MONOABS 0.5 03/05/2018 0500   EOSABS 0.2 03/05/2018 0500   EOSABS 0.4 01/08/2018 1153   BASOSABS 0.0 03/05/2018 0500   BASOSABS 0.0 01/08/2018 1153   CMP     Component Value Date/Time   NA 147 (H) 03/20/2018 0537   NA 137 01/28/2018 1022   K 4.4 03/20/2018 0537   CL 108 03/20/2018 0537   CO2 28 03/20/2018 0537   GLUCOSE 206 (H) 03/20/2018 0537   BUN 104 (H) 03/20/2018 0537   BUN 30 (H) 01/28/2018 1022   CREATININE 2.09 (H) 03/20/2018 0537   CALCIUM 8.7 (L) 03/20/2018 0537   PROT 7.1 03/18/2018 0817   ALBUMIN 3.0 (L) 03/18/2018 0817   AST 36 03/18/2018 0817   ALT 17 03/18/2018 0817   ALKPHOS 35 (L) 03/18/2018 0817   BILITOT 0.6 03/18/2018 0817   GFRNONAA 24 (L) 03/20/2018 0537   GFRAA 28 (L) 03/20/2018 0537  VPA level 17  Imaging I have reviewed images in epic and the results pertinent to this  consultation are: MRI 03/17/2018 unremarkable for acute process.  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 CT head done on presentation 03/17/2018 unremarkable for new process.  Assessment: 68 year old woman presenting shortness of breath and chest pain, being treated for CHF exacerbation.  Neurological consultation for patient be encephalopathic and agitated during this admission. Initial thought process was that patient has multiple medications with sedating side effects, and in the setting of drainage renal function, this could cause encephalopathy. Due to her history of seizure disorder, a spot EEG was done that showed possible sharps over the left hemisphere but a long-term EEG overnight did not reveal any evidence of clinical or subclinical seizures. She is currently on Depakote, but her level is subtherapeutic at 17. I still believe  that this is continuing toxic metabolic encephalopathy due to deranged metabolic panel as well as possible medication overuse which she is known history of. No meningitic signs on exam.  Do not see the value of LP at this time.  Impression: Toxic metabolic encephalopathy Concern for breakthrough seizure- unremarkable EEG for seizures  Recommendations: Decrease sedating medications.  Discontinued Seroquel.  Reduced MS Contin dose to half of what she was on-now at 15 twice daily. Continue Depakote for a total dose of 1000 daily Additional load of Depakote 500 mg IV x1 now Check Depakote level in 2 to 3 days Continue to correct toxic metabolic derangements We will follow with you  -- Andrea Portland, MD [facility] Pager: [phone] If 7pm to 7am, please call on call as listed on AMION.

## 2018-03-20 NOTE — Progress Notes (Signed)
LTM completed; no skin breakdown was seen.

## 2018-03-20 NOTE — Procedures (Signed)
  Electroencephalogram report- LTM with VIDEO  Beginning time: 03/19/18 at 16 23  Ending time:  03/20/18 at 0730 CPT/type : 95720  Day of study: day 1    Technical Description: The EEG was performed using standard setting per the guidelines of American Clinical Neurophysiology Society (ACNS).    A minimum of 21 electrodes were placed on scalp according to the International 10-20 or/and 10-10 Systems. Supplemental electrodes were placed as needed. Single EKG electrode was also used to detect cardiac arrhythmia. Patient's behavior was continuously recorded on video simultaneously with EEG. A minimum of 16 channels were used for data display. Each epoch of study was reviewed manually daily and as needed using standard referential and bipolar montages. Computerized quantitative EEG analysis (such as compressed spectral array analysis, trending, automated spike & seizure detection) were used as indicated.    Spike detection: ON  Seizure detection: ON   This 17 hours of continuous  EEG monitoring with simultaneous video monitoring was performed for this patient with spells of unresponsiveness rule out clinical and subclinical electrographic seizures to explain her symptoms.   Medications:as per EMR  Background activities marked by disorganized sharply contoured 3 to 5 cps slowing distributed broadly.  Delta slowing carried sharp triphasic morphology however without well-defined interictal epileptiform discharges.  No clinical subclinical seizures present.  EEG was Reactive with stage changes.  Clinical interpretation: This 17  hours of continuous EEG monitoring with simultaneous video monitoring did not record any clinical or subclinical seizures.  Background activities were abnormal due to continuous reactive background activity slowing the disorganized with sharp triphasic waves present throughout the recording without posterior dominant waking rhythm.  These findings suggestive of moderate to  severe encephalopathy of nonspecific etiologies.  Metabolic causes would be favored based on EEG findings.  Clinical correlation is advised.

## 2018-03-20 NOTE — Progress Notes (Signed)
Patient has been sleeping most of the shift.  Refused breakfast and lunch.  Responds to verbal stimuli.  Bedside care done by nursing staff.  No signs of distress. PO  Meds not given, patient too drowsy.  Will continue to monitor and address patient's needs.  Marcille Blanco, RN

## 2018-03-20 NOTE — Progress Notes (Signed)
PROGRESS NOTE    Andrea [name]  BPZ:[id number] DOB: [birth year] DOA: 03/15/2018 PCP: Medicine, [facility]     Brief Narrative:  Andrea [name] is a 68 year old with past medical history relevant for coronary artery disease status post PCI x2 in 12/2017, stage IV CKD hypothyroidism, hypertension, hyperlipidemia, chronic diastolic heart failure, seizure disorder, chronic pain who comes in chest pain as well as coughing and wheezing and found to have acute on chronic diastolic heart failure. Her course has been complicated by encephalopathy, requiring restraints.  New events last 24 hours / Subjective: On examination, patient remains alert, not oriented.  Currently undergoing LTM.  Assessment & Plan:   Principal Problem:   Encephalopathy Active Problems:   Essential hypertension   Insulin dependent diabetes mellitus (HCC)   Hypothyroidism   History of seizures   Esophageal reflux   Acute CHF (congestive heart failure) (HCC)   Chest pain   Cardiomyopathy ([place])   CHF (congestive heart failure) (HCC)   Anemia   GAD (generalized anxiety disorder)   Hyperkalemia   Acute toxic/metabolic encephalopathy -CT head, MRI brain without acute abnormality, did reveal chronic ischemic microangiopathy -Ammonia normal -Could be secondary to chronic opioid dependence, became somnolent, then received Narcan.  Patient may be agitated, combative secondary to withdrawal.  COWS ordered -EEG obtained, revealed sharp waves over left hemisphere -Discussed with neurology, appreciate consultation.  Currently undergoing LTM  Acute on chronic diastolic heart failure -BNP 1379.9 -Breathing more labored this morning, 1 dose IV lasix today  -Daily weight, strict I/Os   Chest pain -Initially complaining of chest pain, troponin negative x3 -Continue Plavix, Imdur  AKI on chronic kidney disease stage IV -Baseline creatinine 2.4  -Avoid nephrotoxins  -Back to baseline,  creatinine 2.09 today  Hypertension -Continue amlodipine, Coreg, hydralazine  Hyperlipidemia -Continue atorvastatin  Hypothyroidism -TSH within normal -Continue Synthroid  Type 2 diabetes, well controlled -Continue Lantus, sliding scale insulin  Chronic pain syndrome -Resume her chronic morphine dosing  Depression/anxiety -Continue buspirone  Seizure disorder -Continue Depakote, changed to IV   DVT prophylaxis: Subcutaneous heparin Code Status: Full code Family Communication: No family at bedside Disposition Plan: Pending improvement in her encephalopathy   Consultants:   Neurology  Procedures:   None  Antimicrobials:  Anti-infectives (From admission, onward)   None       Objective: Vitals:   03/19/18 2200 03/20/18 0039 03/20/18 0500 03/20/18 0546  BP:   (!) 180/74 (!) 177/79  Pulse: 82  84 77  Resp:   (!) 22   Temp:   (!) 97.5 F (36.4 C)   TempSrc:   Oral   SpO2: 94%  95%   Weight:  70.2 kg 69.8 kg   Height:        Intake/Output Summary (Last 24 hours) at 03/20/2018 1045 Last data filed at 03/20/2018 0600 Gross per 24 hour  Intake 188.29 ml  Output 400 ml  Net -211.71 ml   Filed Weights   03/19/18 0648 03/20/18 0039 03/20/18 0500  Weight: 69.9 kg 70.2 kg 69.8 kg    Examination: General exam: Appears calm and comfortable  Respiratory system: Clear to auscultation anteriorly. Respiratory effort slightly increased from yesterday. Cardiovascular system: S1 & S2 heard, RRR. No JVD, murmurs, rubs, gallops or clicks. No pedal edema. Gastrointestinal system: Abdomen is nondistended, soft and nontender. No organomegaly or masses felt. Normal bowel sounds heard. Central nervous system: Alert, not oriented Extremities: Symmetric  Skin: No rashes, lesions or ulcers  Data Reviewed: I have personally  reviewed following labs and imaging studies  CBC: Recent Labs  Lab 03/15/18 0728 03/16/18 0221 03/17/18 0434 03/18/18 0530 03/19/18 0626  WBC  6.1 6.3 5.0 4.9 6.2  HGB 8.4* 8.1* 7.7* 8.1* 9.0*  HCT 27.6* 26.5* 26.6* 26.2* 30.1*  MCV 97.5 94.6 99.3 97.8 97.1  PLT 212 200 188 215 607   Basic Metabolic Panel: Recent Labs  Lab 03/16/18 0221 03/17/18 0434 03/18/18 0530 03/19/18 0626 03/20/18 0537  NA 143 141 144 145 147*  K 5.0 5.4* 5.4* 5.3* 4.4  CL 105 102 104 108 108  CO2 29 26 30 24 28   GLUCOSE 95 93 114* 124* 206*  BUN 76* 85* 95* 98* 104*  CREATININE 2.49* 3.16* 2.97* 2.39* 2.09*  CALCIUM 9.1 8.7* 8.5* 8.9 8.7*  MG  --  2.4 2.5*  --   --   PHOS  --   --  4.3  --   --    GFR: Estimated Creatinine Clearance: 25.4 mL/min (A) (by C-G formula based on SCr of 2.09 mg/dL (H)). Liver Function Tests: Recent Labs  Lab 03/15/18 0728 03/18/18 0530 03/18/18 0817  AST 25  --  36  ALT 15  --  17  ALKPHOS 40  --  35*  BILITOT 0.4  --  0.6  PROT 6.9  --  7.1  ALBUMIN 3.0* 2.9* 3.0*   No results for input(s): LIPASE, AMYLASE in the last 168 hours. Recent Labs  Lab 03/18/18 0817  AMMONIA 31   Coagulation Profile: No results for input(s): INR, PROTIME in the last 168 hours. Cardiac Enzymes: Recent Labs  Lab 03/15/18 1402 03/15/18 1948 03/16/18 0221  TROPONINI <0.03 0.03* 0.03*   BNP (last 3 results) No results for input(s): PROBNP in the last 8760 hours. HbA1C: No results for input(s): HGBA1C in the last 72 hours. CBG: Recent Labs  Lab 03/19/18 1131 03/19/18 1705 03/19/18 2101 03/20/18 0042 03/20/18 0620  GLUCAP 130* 183* 200* 187* 178*   Lipid Profile: No results for input(s): CHOL, HDL, LDLCALC, TRIG, CHOLHDL, LDLDIRECT in the last 72 hours. Thyroid Function Tests: Recent Labs    03/18/18 0530  TSH 1.937   Anemia Panel: No results for input(s): VITAMINB12, FOLATE, FERRITIN, TIBC, IRON, RETICCTPCT in the last 72 hours. Sepsis Labs: No results for input(s): PROCALCITON, LATICACIDVEN in the last 168 hours.  No results found for this or any previous visit (from the past 240 hour(s)).      Radiology Studies: No results found.    Scheduled Meds: . amLODipine  10 mg Oral Daily  . atorvastatin  80 mg Oral QHS  . busPIRone  15 mg Oral BID  . carvedilol  12.5 mg Oral BID WC  . clopidogrel  75 mg Oral Daily  . heparin  5,000 Units Subcutaneous Q8H  . hydrALAZINE  100 mg Oral Q8H  . insulin aspart  0-5 Units Subcutaneous QHS  . insulin aspart  0-9 Units Subcutaneous TID WC  . insulin glargine  10 Units Subcutaneous QHS  . isosorbide mononitrate  30 mg Oral Daily  . levothyroxine  200 mcg Oral QAC breakfast  . LORazepam  1 mg Intravenous Once  . morphine  30 mg Oral BID  . QUEtiapine  50 mg Oral QHS   Continuous Infusions: . sodium chloride 250 mL (03/19/18 1735)  . valproate sodium 250 mg (03/20/18 0554)     LOS: 3 days    Time spent: 20 minutes   Dessa Phi, DO [facility] [email or web address] 03/20/2018, 10:45 AM

## 2018-03-20 NOTE — Care Management Important Message (Signed)
Important Message  Patient Details  Name: Andrea [name] MRN: [id number] Date of Birth: 11-Oct-1950   Medicare Important Message Given:  Yes    Shelda Altes 03/20/2018, 2:16 PM

## 2018-03-20 NOTE — Telephone Encounter (Signed)
Number rings busy

## 2018-03-20 NOTE — Progress Notes (Signed)
Neurology Progress Note   S:// Patient seen and examined.  Remains encephalopathic. Spot EEG with concern for sharp waves over the left hemisphere. Overnight LTM EEG with no evidence of seizure.  Sharp triphasic waves consistent with severe metabolic encephalopathy seen in both hemispheres.  O:// Current vital signs: BP (!) 185/82 (BP Location: Right Wrist)   Pulse 77   Temp (!) 97.5 F (36.4 C) (Oral)   Resp 14   Ht 5\' 4"  (1.626 m)   Wt 69.8 kg   SpO2 95%   BMI 26.41 kg/m  Vital signs in last 24 hours: Temp:  [97.5 F (36.4 C)] 97.5 F (36.4 C) (02/28 0500) Pulse Rate:  [77-84] 77 (02/28 1122) Resp:  [14-22] 14 (02/28 1122) BP: (168-185)/(74-85) 185/82 (02/28 1122) SpO2:  [90 %-95 %] 95 % (02/28 1122) Weight:  [69.8 kg-70.2 kg] 69.8 kg (02/28 0500) General: Awake, alert, slow to respond to questions HEENT: Normocephalic atraumatic CVs: Z6-S0 heard regular rate rhythm Respiratory: Chest clear to auscultation Extremities: Trace edema Neurological exam Patient is awake, alert, oriented to self.  Not oriented to place or time. Bradyphrenic No dysarthria.  Naming intact, follows simple commands, repeats some sentences incompletely. Poor attention concentration Cranial nerves: Pupils equal round react light, extraocular movements intact, visual fields full, face symmetric, facial sensation intact, tongue midline, palate midline. Motor exam: Normal tone in all 4 extremities, is able to offer some resistance to individual muscle testing but all 4 limbs drop to the bed when trying to keep them up. Sensory exam: Intact light touch all over with minimal moaning to noxious stimulation in all 4 extremities. Cerebellar: Difficult to perform due to inattention   Medications  Current Facility-Administered Medications:  .  0.9 %  sodium chloride infusion, , Intravenous, PRN, Dessa Phi, DO, Last Rate: 10 mL/hr at 03/19/18 1735, 250 mL at 03/19/18 1735 .  acetaminophen  (TYLENOL) tablet 650 mg, 650 mg, Oral, Q6H PRN, 650 mg at 03/15/18 1509 **OR** acetaminophen (TYLENOL) suppository 650 mg, 650 mg, Rectal, Q6H PRN, [name], MD .  albuterol (PROVENTIL) (2.5 MG/3ML) 0.083% nebulizer solution 2.5 mg, 2.5 mg, Nebulization, Q2H PRN, [name], MD .  amLODipine (NORVASC) tablet 10 mg, 10 mg, Oral, Daily, Dessa Phi, DO .  atorvastatin (LIPITOR) tablet 80 mg, 80 mg, Oral, QHS, Ghimire, Dante Gang, MD, 80 mg at 03/17/18 2122 .  busPIRone (BUSPAR) tablet 15 mg, 15 mg, Oral, BID, [name], MD, 15 mg at 03/19/18 2307 .  carvedilol (COREG) tablet 12.5 mg, 12.5 mg, Oral, BID WC, Dessa Phi, DO .  clopidogrel (PLAVIX) tablet 75 mg, 75 mg, Oral, Daily, Ghimire, Kuber, MD, 75 mg at 03/19/18 1157 .  haloperidol (HALDOL) tablet 5 mg, 5 mg, Oral, Q6H PRN **OR** haloperidol lactate (HALDOL) injection 5 mg, 5 mg, Intramuscular, Q6H PRN, Purohit, Shrey C, MD, 5 mg at 03/18/18 0335 .  heparin injection 5,000 Units, 5,000 Units, Subcutaneous, Q8H, Ghimire, Dante Gang, MD, 5,000 Units at 03/20/18 0600 .  hydrALAZINE (APRESOLINE) injection 5 mg, 5 mg, Intravenous, Q4H PRN, Dessa Phi, DO, 5 mg at 03/20/18 1212 .  hydrALAZINE (APRESOLINE) tablet 100 mg, 100 mg, Oral, Q8H, Ghimire, Kuber, MD, 100 mg at 03/19/18 2258 .  insulin aspart (novoLOG) injection 0-5 Units, 0-5 Units, Subcutaneous, QHS, Ghimire, Kuber, MD .  insulin aspart (novoLOG) injection 0-9 Units, 0-9 Units, Subcutaneous, TID WC, [name], MD, 2 Units at 03/20/18 1216 .  insulin glargine (LANTUS) injection 10 Units, 10 Units, Subcutaneous, QHS, Ghimire, Kuber, MD, 10 Units at  03/20/18 0100 .  isosorbide mononitrate (IMDUR) 24 hr tablet 30 mg, 30 mg, Oral, Daily, Ghimire, Kuber, MD, 30 mg at 03/19/18 1158 .  labetalol (NORMODYNE,TRANDATE) injection 5 mg, 5 mg, Intravenous, Q2H PRN, Dessa Phi, DO .  levothyroxine (SYNTHROID, LEVOTHROID) tablet 200 mcg, 200 mcg, Oral, QAC breakfast, [name], MD,  200 mcg at 03/18/18 0950 .  LORazepam (ATIVAN) injection 1 mg, 1 mg, Intravenous, Once, Schorr, Rhetta Mura, NP .  morphine (MS CONTIN) 12 hr tablet 30 mg, 30 mg, Oral, BID, Dessa Phi, DO, 30 mg at 03/19/18 1157 .  ondansetron (ZOFRAN) injection 4 mg, 4 mg, Intravenous, Q8H PRN, Blount, Xenia T, NP, 4 mg at 03/16/18 5409 .  QUEtiapine (SEROQUEL) tablet 50 mg, 50 mg, Oral, QHS, Dessa Phi, DO, 50 mg at 03/19/18 2313 .  valproate (DEPACON) 250 mg in dextrose 5 % 50 mL IVPB, 250 mg, Intravenous, Q6H, Tyrone Apple, RPH, Last Rate: 52.5 mL/hr at 03/20/18 1219, 250 mg at 03/20/18 1219 Labs CBC Labs(Brief)          Component Value Date/Time   WBC 6.2 03/19/2018 0626   RBC 3.10 (L) 03/19/2018 0626   HGB 9.0 (L) 03/19/2018 0626   HGB 10.8 (L) 01/28/2018 1022   HCT 30.1 (L) 03/19/2018 0626   HCT 32.0 (L) 01/28/2018 1022   PLT 181 03/19/2018 0626   PLT 277 01/28/2018 1022   MCV 97.1 03/19/2018 0626   MCV 90 01/28/2018 1022   MCH 29.0 03/19/2018 0626   MCHC 29.9 (L) 03/19/2018 0626   RDW 12.8 03/19/2018 0626   RDW 12.6 01/28/2018 1022   LYMPHSABS 2.0 03/05/2018 0500   LYMPHSABS 2.6 01/08/2018 1153   MONOABS 0.5 03/05/2018 0500   EOSABS 0.2 03/05/2018 0500   EOSABS 0.4 01/08/2018 1153   BASOSABS 0.0 03/05/2018 0500   BASOSABS 0.0 01/08/2018 1153     CMP     Labs(Brief)          Component Value Date/Time   NA 147 (H) 03/20/2018 0537   NA 137 01/28/2018 1022   K 4.4 03/20/2018 0537   CL 108 03/20/2018 0537   CO2 28 03/20/2018 0537   GLUCOSE 206 (H) 03/20/2018 0537   BUN 104 (H) 03/20/2018 0537   BUN 30 (H) 01/28/2018 1022   CREATININE 2.09 (H) 03/20/2018 0537   CALCIUM 8.7 (L) 03/20/2018 0537   PROT 7.1 03/18/2018 0817   ALBUMIN 3.0 (L) 03/18/2018 0817   AST 36 03/18/2018 0817   ALT 17 03/18/2018 0817   ALKPHOS 35 (L) 03/18/2018 0817   BILITOT 0.6 03/18/2018 0817   GFRNONAA 24 (L) 03/20/2018 0537   GFRAA 28 (L) 03/20/2018  0537    VPA level 17 NH3 - WNL Low B12  Imaging I have reviewed images in epic and the results pertinent to this consultation are: MRI 03/17/2018 unremarkable for acute process.  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 CT head done on presentation 03/17/2018 unremarkable for new process.  Assessment: 68 year old woman presenting shortness of breath and chest pain, being treated for CHF exacerbation.  Neurological consultation for patient be encephalopathic and agitated during this admission. Initial thought process was that patient has multiple medications with sedating side effects, and in the setting of drainage renal function, this could cause encephalopathy. Due to her history of seizure disorder, a spot EEG was done that showed possible sharps over the left hemisphere but a long-term EEG  overnight did not reveal any evidence of clinical or subclinical seizures. She is currently on Depakote, but her level is subtherapeutic at 17. I still believe that this is continuing toxic metabolic encephalopathy due to deranged metabolic panel as well as possible medication overuse which she is known history of. No meningitic signs on exam.  Do not see the value of LP at this time.  Impression: Toxic metabolic encephalopathy Concern for breakthrough seizure- unremarkable EEG for seizures  Recommendations: Decrease sedating medications.  Discontinued Seroquel.  Reduced MS Contin dose to half of what she was on-now at 15 twice daily. Continue Depakote for a total dose of 1000 daily Additional load of Depakote 500 mg IV x1 now Check Depakote level in 2 to 3 days Continue to correct toxic metabolic derangements Supplement B12 parenterally aggressively  We will follow with you  -- Amie Portland, MD [facility] Pager: [phone] If 7pm to 7am, please call on call as listed on AMION.

## 2018-03-21 LAB — CBC
HCT: 29.8 % — ABNORMAL LOW (ref 36.0–46.0)
Hemoglobin: 9.7 g/dL — ABNORMAL LOW (ref 12.0–15.0)
MCH: 29.6 pg (ref 26.0–34.0)
MCHC: 32.6 g/dL (ref 30.0–36.0)
MCV: 90.9 fL (ref 80.0–100.0)
Platelets: 210 10*3/uL (ref 150–400)
RBC: 3.28 MIL/uL — ABNORMAL LOW (ref 3.87–5.11)
RDW: 12.6 % (ref 11.5–15.5)
WBC: 7.3 10*3/uL (ref 4.0–10.5)
nRBC: 0 % (ref 0.0–0.2)

## 2018-03-21 LAB — GLUCOSE, CAPILLARY
GLUCOSE-CAPILLARY: 139 mg/dL — AB (ref 70–99)
Glucose-Capillary: 135 mg/dL — ABNORMAL HIGH (ref 70–99)
Glucose-Capillary: 158 mg/dL — ABNORMAL HIGH (ref 70–99)
Glucose-Capillary: 153 mg/dL — ABNORMAL HIGH (ref 70–99)

## 2018-03-21 LAB — BASIC METABOLIC PANEL
Anion gap: 9 (ref 5–15)
BUN: 106 mg/dL — ABNORMAL HIGH (ref 8–23)
CHLORIDE: 111 mmol/L (ref 98–111)
CO2: 31 mmol/L (ref 22–32)
Calcium: 8.8 mg/dL — ABNORMAL LOW (ref 8.9–10.3)
Creatinine, Ser: 1.8 mg/dL — ABNORMAL HIGH (ref 0.44–1.00)
GFR calc Af Amer: 33 mL/min — ABNORMAL LOW (ref >60)
GFR calc non Af Amer: 29 mL/min — ABNORMAL LOW (ref >60)
Glucose, Bld: 160 mg/dL — ABNORMAL HIGH (ref 70–99)
Potassium: 4 mmol/L (ref 3.5–5.1)
Sodium: 151 mmol/L — ABNORMAL HIGH (ref 135–145)

## 2018-03-21 LAB — VALPROIC ACID LEVEL: Valproic Acid Lvl: 72 ug/mL (ref 50.0–100.0)

## 2018-03-21 MED ORDER — SODIUM CHLORIDE 0.9 % IV SOLN: INTRAVENOUS | Status: DC

## 2018-03-21 MED ORDER — CARVEDILOL 12.5 MG PO TABS: 12.5000 mg | ORAL_TABLET | Freq: Two times a day (BID) | ORAL | Status: DC

## 2018-03-21 MED ORDER — METOPROLOL TARTRATE 5 MG/5ML IV SOLN
2.5000 mg | Freq: Four times a day (QID) | INTRAVENOUS | Status: DC
  Administered 2018-03-21 – 2018-03-24 (×11): 2.5 mg via INTRAVENOUS
  Filled 2018-03-21 (×12): qty 5

## 2018-03-21 MED ORDER — LEVOTHYROXINE SODIUM 100 MCG/5ML IV SOLN
100.0000 ug | INTRAVENOUS | Status: DC
  Administered 2018-03-21 – 2018-03-22 (×2): 100 ug via INTRAVENOUS
  Filled 2018-03-21 (×3): qty 5

## 2018-03-21 MED ORDER — CYANOCOBALAMIN 1000 MCG/ML IJ SOLN
1000.0000 ug | Freq: Every day | INTRAMUSCULAR | Status: DC
  Administered 2018-03-21 – 2018-03-25 (×5): 1000 ug via INTRAMUSCULAR
  Filled 2018-03-21 (×5): qty 1

## 2018-03-21 MED ORDER — HYDRALAZINE HCL 20 MG/ML IJ SOLN
10.0000 mg | Freq: Three times a day (TID) | INTRAMUSCULAR | Status: DC
  Administered 2018-03-21 – 2018-03-24 (×8): 10 mg via INTRAVENOUS
  Filled 2018-03-21 (×9): qty 1

## 2018-03-21 MED ORDER — CLONIDINE HCL 0.2 MG PO TABS: 0.2000 mg | ORAL_TABLET | Freq: Once | ORAL | Status: DC

## 2018-03-21 MED ORDER — CARVEDILOL 25 MG PO TABS: 25.0000 mg | ORAL_TABLET | Freq: Two times a day (BID) | ORAL | Status: DC

## 2018-03-21 MED ORDER — DEXTROSE-NACL 5-0.45 % IV SOLN
INTRAVENOUS | Status: DC
  Administered 2018-03-21: 10:00:00 via INTRAVENOUS

## 2018-03-21 NOTE — Progress Notes (Signed)
PROGRESS NOTE    Andrea [name]  PYK:[id number] DOB: 06-May-1950 DOA: 03/15/2018 PCP: Medicine, [facility]     Brief Narrative:  Andrea [name] is a 67 year old with past medical history relevant for coronary artery disease status post PCI x2 in 12/2017, stage IV CKD hypothyroidism, hypertension, hyperlipidemia, chronic diastolic heart failure, seizure disorder, chronic pain who comes in chest pain as well as coughing and wheezing and found to have acute on chronic diastolic heart failure. Her course has been complicated by encephalopathy, requiring restraints.  New events last 24 hours / Subjective: Patient remains somnolent today but does not appear to be in any distress  Assessment & Plan:   Principal Problem:   Encephalopathy Active Problems:   Essential hypertension   Insulin dependent diabetes mellitus (HCC)   Hypothyroidism   History of seizures   Esophageal reflux   Acute CHF (congestive heart failure) (HCC)   Chest pain   Cardiomyopathy ([place])   CHF (congestive heart failure) (HCC)   Anemia   GAD (generalized anxiety disorder)   Hyperkalemia   Acute toxic/metabolic encephalopathy -CT head, MRI brain without acute abnormality, did reveal chronic ischemic microangiopathy -Ammonia normal -Could be secondary to chronic opioid dependence, became somnolent, then received Narcan.  Patient may be agitated, combative secondary to withdrawal.  COWS ordered -EEG obtained, revealed sharp waves over left hemisphere -LTM EEG suggestive of moderate to severe encephalopathy of nonspecific etiology, metabolic causes favored -Appreciate neurology  Acute on chronic diastolic heart failure -BNP 1379.9 -Daily weight, strict I/Os  -Appears euvolemic on examination today   Chest pain -Initially complaining of chest pain on admission, troponin negative x3 -Continue Plavix, Imdur when able to take p.o.  AKI on chronic kidney disease stage IV -Baseline  creatinine 2.4  -Avoid nephrotoxins  -Improved, creatinine 1.8 today  Hypertension -Patient's blood pressure remains elevated, it appears that she did not take any of her p.o. antihypertensives yesterday. Changed her p.o. medications to IV metoprolol, IV hydralazine  Hyperlipidemia -Continue atorvastatin when able to take p.o.  Hypothyroidism -TSH within normal -Continue Synthroid changed to IV today  Type 2 diabetes, well controlled -Continue Lantus, sliding scale insulin  Chronic pain syndrome -Stop morphine  Depression/anxiety -Continue buspirone when able to take p.o.  Seizure disorder -Continue Depakote, changed to IV  Hypernatremia -D5 half-normal saline added today, trend BMP   DVT prophylaxis: Subcutaneous heparin Code Status: Full code Family Communication: Spoke with son over the phone Disposition Plan: Pending improvement in her encephalopathy.  Discussed with the son that patient has not had much of nutrition intake over the past few days.  If patient's mentation does not improve over the weekend, we need to consider cortrack with tube feeding initiation.  He will talk with his brother so he can stop by over the weekend.  Patient's sister will also be in the hospital on Monday.   Consultants:   Neurology  Procedures:   None  Antimicrobials:  Anti-infectives (From admission, onward)   None       Objective: Vitals:   03/20/18 1948 03/21/18 0456 03/21/18 0639 03/21/18 0930  BP:  (!) 187/86 (!) 185/67 (!) 170/71  Pulse: 79 65    Resp: 19 20    Temp: (!) 97.5 F (36.4 C)     TempSrc: Oral     SpO2: 96%     Weight:      Height:        Intake/Output Summary (Last 24 hours) at 03/21/2018 1015 Last data filed at  03/20/2018 2030 Gross per 24 hour  Intake 105 ml  Output 550 ml  Net -445 ml   Filed Weights   03/19/18 0648 03/20/18 0039 03/20/18 0500  Weight: 69.9 kg 70.2 kg 69.8 kg    Examination: General exam: Appears calm, somnolent    Respiratory system: Clear to auscultation. Respiratory effort normal. Cardiovascular system: S1 & S2 heard, RRR. No JVD, murmurs, rubs, gallops or clicks. No pedal edema. Gastrointestinal system: Abdomen is nondistended, soft and nontender. Central nervous system: Somnolent  Extremities: Symmetric    Data Reviewed: I have personally reviewed following labs and imaging studies  CBC: Recent Labs  Lab 03/16/18 0221 03/17/18 0434 03/18/18 0530 03/19/18 0626 03/21/18 0642  WBC 6.3 5.0 4.9 6.2 7.3  HGB 8.1* 7.7* 8.1* 9.0* 9.7*  HCT 26.5* 26.6* 26.2* 30.1* 29.8*  MCV 94.6 99.3 97.8 97.1 90.9  PLT 200 188 215 181 403   Basic Metabolic Panel: Recent Labs  Lab 03/17/18 0434 03/18/18 0530 03/19/18 0626 03/20/18 0537 03/21/18 0642  NA 141 144 145 147* 151*  K 5.4* 5.4* 5.3* 4.4 4.0  CL 102 104 108 108 111  CO2 26 30 24 28 31   GLUCOSE 93 114* 124* 206* 160*  BUN 85* 95* 98* 104* 106*  CREATININE 3.16* 2.97* 2.39* 2.09* 1.80*  CALCIUM 8.7* 8.5* 8.9 8.7* 8.8*  MG 2.4 2.5*  --   --   --   PHOS  --  4.3  --   --   --    GFR: Estimated Creatinine Clearance: 29.5 mL/min (A) (by C-G formula based on SCr of 1.8 mg/dL (H)). Liver Function Tests: Recent Labs  Lab 03/15/18 0728 03/18/18 0530 03/18/18 0817  AST 25  --  36  ALT 15  --  17  ALKPHOS 40  --  35*  BILITOT 0.4  --  0.6  PROT 6.9  --  7.1  ALBUMIN 3.0* 2.9* 3.0*   No results for input(s): LIPASE, AMYLASE in the last 168 hours. Recent Labs  Lab 03/18/18 0817  AMMONIA 31   Coagulation Profile: No results for input(s): INR, PROTIME in the last 168 hours. Cardiac Enzymes: Recent Labs  Lab 03/15/18 1402 03/15/18 1948 03/16/18 0221  TROPONINI <0.03 0.03* 0.03*   BNP (last 3 results) No results for input(s): PROBNP in the last 8760 hours. HbA1C: No results for input(s): HGBA1C in the last 72 hours. CBG: Recent Labs  Lab 03/20/18 0620 03/20/18 1127 03/20/18 1640 03/20/18 2131 03/21/18 0657  GLUCAP 178*  166* 170* 153* 135*   Lipid Profile: No results for input(s): CHOL, HDL, LDLCALC, TRIG, CHOLHDL, LDLDIRECT in the last 72 hours. Thyroid Function Tests: No results for input(s): TSH, T4TOTAL, FREET4, T3FREE, THYROIDAB in the last 72 hours. Anemia Panel: No results for input(s): VITAMINB12, FOLATE, FERRITIN, TIBC, IRON, RETICCTPCT in the last 72 hours. Sepsis Labs: No results for input(s): PROCALCITON, LATICACIDVEN in the last 168 hours.  No results found for this or any previous visit (from the past 240 hour(s)).     Radiology Studies: No results found.    Scheduled Meds: . amLODipine  10 mg Oral Daily  . atorvastatin  80 mg Oral QHS  . busPIRone  15 mg Oral BID  . carvedilol  12.5 mg Oral BID WC  . clopidogrel  75 mg Oral Daily  . cyanocobalamin  1,000 mcg Intramuscular Daily  . heparin  5,000 Units Subcutaneous Q8H  . hydrALAZINE  100 mg Oral Q8H  . insulin aspart  0-5 Units  Subcutaneous QHS  . insulin aspart  0-9 Units Subcutaneous TID WC  . insulin glargine  10 Units Subcutaneous QHS  . isosorbide mononitrate  30 mg Oral Daily  . levothyroxine  200 mcg Oral QAC breakfast   Continuous Infusions: . sodium chloride 250 mL (03/19/18 1735)  . dextrose 5 % and 0.45% NaCl 50 mL/hr at 03/21/18 0943  . valproate sodium 250 mg (03/21/18 0555)     LOS: 4 days    Time spent: 20 minutes   Dessa Phi, DO [facility] [email or web address] 03/21/2018, 10:15 AM

## 2018-03-21 NOTE — Progress Notes (Signed)
Pt BP continues to climb despite the administration of PRN labetalol & scheduled metoprolol. Pt BP 203/91 (123),  RR 24, with accessory muscle use noted. SpO2 98% on 2L, but pt remains labored. BP checked manually, see flow sheet. Pt somnolent, no other s/s distress noted. On call provider paged, awaiting callback.

## 2018-03-21 NOTE — Progress Notes (Signed)
Patient had an unmeasured urine occurrence, peri care is provided and full linen change. Patient is only responding to verbal stimuli.

## 2018-03-21 NOTE — Progress Notes (Signed)
Patient skin is assessed under the mittens, it is intact. moisturizer is applied to lips and patient is turned on her back.

## 2018-03-21 NOTE — Progress Notes (Signed)
Neurology Progress Note   S:// Patient drowsy, arouses to name calling and to noxious stimuli.  O:// Current vital signs: BP (!) 185/82 (BP Location: Right Wrist)   Pulse 77   Temp (!) 97.5 F (36.4 C) (Oral)   Resp 14   Ht 5\' 4"  (4.403 m)   Wt 69.8 kg   SpO2 95%   BMI 26.41 kg/m  Vital signs in last 24 hours: Temp:  [97.5 F (36.4 C)] 97.5 F (36.4 C) (02/28 0500) Pulse Rate:  [77-84] 77 (02/28 1122) Resp:  [14-22] 14 (02/28 1122) BP: (168-185)/(74-85) 185/82 (02/28 1122) SpO2:  [90 %-95 %] 95 % (02/28 1122) Weight:  [69.8 kg-70.2 kg] 69.8 kg (02/28 0500) General: drowsy,  slow to respond to questions HEENT: Normocephalic atraumatic CVs: K7-Q2 heard regular rate rhythm Respiratory: Chest clear to auscultation Extremities:  Edematous Neurological exam Patient is drowsy, alert, oriented to self only.  Not oriented to place or time. Did not follow commands.  Bradyphrenic, poor attention/concentration. Cranial nerves: Pupils equal round react light, extraocular movements intact, visual fields full, face symmetric, facial sensation intact, tongue midline, palate midline. Motor / sensory exam:  Moaned and groaned to noxious stimuli, but did not withdraw. Did move all 4 extremities spontaneously. Cerebellar: UTA  Medications  Current Facility-Administered Medications:  .  0.9 %  sodium chloride infusion, , Intravenous, PRN, Dessa Phi, DO, Last Rate: 10 mL/hr at 03/19/18 1735, 250 mL at 03/19/18 1735 .  acetaminophen (TYLENOL) tablet 650 mg, 650 mg, Oral, Q6H PRN, 650 mg at 03/15/18 1509 **OR** acetaminophen (TYLENOL) suppository 650 mg, 650 mg, Rectal, Q6H PRN, [name], MD .  albuterol (PROVENTIL) (2.5 MG/3ML) 0.083% nebulizer solution 2.5 mg, 2.5 mg, Nebulization, Q2H PRN, [name], MD .  amLODipine (NORVASC) tablet 10 mg, 10 mg, Oral, Daily, Dessa Phi, DO .  atorvastatin (LIPITOR) tablet 80 mg, 80 mg, Oral, QHS, Ghimire, Dante Gang, MD, 80 mg at  03/17/18 2122 .  busPIRone (BUSPAR) tablet 15 mg, 15 mg, Oral, BID, [name], MD, 15 mg at 03/19/18 2307 .  carvedilol (COREG) tablet 12.5 mg, 12.5 mg, Oral, BID WC, Dessa Phi, DO .  clopidogrel (PLAVIX) tablet 75 mg, 75 mg, Oral, Daily, Ghimire, Kuber, MD, 75 mg at 03/19/18 1157 .  haloperidol (HALDOL) tablet 5 mg, 5 mg, Oral, Q6H PRN **OR** haloperidol lactate (HALDOL) injection 5 mg, 5 mg, Intramuscular, Q6H PRN, Purohit, Shrey C, MD, 5 mg at 03/18/18 0335 .  heparin injection 5,000 Units, 5,000 Units, Subcutaneous, Q8H, Ghimire, Dante Gang, MD, 5,000 Units at 03/20/18 0600 .  hydrALAZINE (APRESOLINE) injection 5 mg, 5 mg, Intravenous, Q4H PRN, Dessa Phi, DO, 5 mg at 03/20/18 1212 .  hydrALAZINE (APRESOLINE) tablet 100 mg, 100 mg, Oral, Q8H, Ghimire, Kuber, MD, 100 mg at 03/19/18 2258 .  insulin aspart (novoLOG) injection 0-5 Units, 0-5 Units, Subcutaneous, QHS, Ghimire, Kuber, MD .  insulin aspart (novoLOG) injection 0-9 Units, 0-9 Units, Subcutaneous, TID WC, [name], MD, 2 Units at 03/20/18 1216 .  insulin glargine (LANTUS) injection 10 Units, 10 Units, Subcutaneous, QHS, [name], MD, 10 Units at 03/20/18 0100 .  isosorbide mononitrate (IMDUR) 24 hr tablet 30 mg, 30 mg, Oral, Daily, Ghimire, Kuber, MD, 30 mg at 03/19/18 1158 .  labetalol (NORMODYNE,TRANDATE) injection 5 mg, 5 mg, Intravenous, Q2H PRN, Dessa Phi, DO .  levothyroxine (SYNTHROID, LEVOTHROID) tablet 200 mcg, 200 mcg, Oral, QAC breakfast, [name], MD, 200 mcg at 03/18/18 0950 .  LORazepam (ATIVAN) injection 1 mg, 1 mg, Intravenous,  Once, Schorr, Rhetta Mura, NP .  morphine (MS CONTIN) 12 hr tablet 30 mg, 30 mg, Oral, BID, Dessa Phi, DO, 30 mg at 03/19/18 1157 .  ondansetron (ZOFRAN) injection 4 mg, 4 mg, Intravenous, Q8H PRN, Blount, Xenia T, NP, 4 mg at 03/16/18 3734 .  QUEtiapine (SEROQUEL) tablet 50 mg, 50 mg, Oral, QHS, Dessa Phi, DO, 50 mg at 03/19/18 2313 .  valproate (DEPACON)  250 mg in dextrose 5 % 50 mL IVPB, 250 mg, Intravenous, Q6H, Dang, Thuy D, RPH, Last Rate: 52.5 mL/hr at 03/20/18 1219, 250 mg at 03/20/18 1219   Labs 03/21/2018: NA: 151 BG; 160 BUN: 106 GFR:29 creatinine 1.80  VPA level 17 NH3 - WNL Low B12  Imaging MRI 03/17/2018 unremarkable for acute process.  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 CT head done on presentation 03/17/2018 unremarkable for new process.   Laurey Morale, MSN, NP-C [facility] [phone]  Attending Neurologist note to follow:  ATTENDING ADDENDUM Seen and examined independently. Agree with physical exam documented above.  Assessment: 68 year old woman presenting with shortness of breath and chest pain, being treated for CHF exacerbation.  Neurological consultation for patient b/c she is encephalopathic and agitated during this admission. Initial thought process was that patient has multiple medications with sedating side effects, and in the setting of drainage renal function, this could cause encephalopathy. Due to her history of seizure disorder, a spot EEG was done that showed possible sharps over the left hemisphere but a long-term EEG overnight did not reveal any evidence of clinical or subclinical seizures. She is currently on Depakote, but her level is subtherapeutic at 17. I still believe that this is continuing toxic metabolic encephalopathy due to deranged metabolic panel as well as possible medication overuse which she has a known history of. No meningitic signs on exam.  Do not see the value of LP at this time.  Impression: Toxic metabolic encephalopathy Concern for breakthrough seizure- unremarkable EEG for seizures  Recommendations: Decrease sedating medications.  Discontinued Seroquel.   continue MS Contin dose  15 twice daily. (half of home dose) Continue Depakote for a total dose of 1000  daily Check Depakote level Continue to correct toxic metabolic derangements Supplement B12 parenterally aggressively  We will follow with you  -- Amie Portland, MD [facility] Pager: [phone] If 7pm to 7am, please call on call as listed on AMION.

## 2018-03-21 NOTE — Progress Notes (Signed)
Patient skin is assessed, peri care given pt turned to her left.

## 2018-03-21 NOTE — Progress Notes (Signed)
Patient had a full linen changed, she had an unmeasured urine occurrence. Skin is assessed and there are no issues. Will continue to monitor.

## 2018-03-21 NOTE — Progress Notes (Signed)
Restrains taken out and skin assessment done. Do skin breakdown. Patient was given oral care and moisturizer is applied to lips. Will continue to monitor.

## 2018-03-21 NOTE — Progress Notes (Signed)
Patient is in bed only respond to verbal stimulus when her name is called. Foam on her coccyx area is change, the skin is intact; foam are applied to both heels in order to give her some cushion and prevent skin breakdown. Patient is very lethargic to take any PO meds, MD is aware and said she will put IV meds in for patient. Mittens restrains are taken out and assess patient skin and it is intact during shift change and now. Will continue to monitor .

## 2018-03-22 ENCOUNTER — Inpatient Hospital Stay (HOSPITAL_COMMUNITY): Payer: Medicare Other

## 2018-03-22 LAB — BASIC METABOLIC PANEL
Anion gap: 9 (ref 5–15)
BUN: 111 mg/dL — ABNORMAL HIGH (ref 8–23)
CO2: 29 mmol/L (ref 22–32)
Calcium: 8.5 mg/dL — ABNORMAL LOW (ref 8.9–10.3)
Chloride: 113 mmol/L — ABNORMAL HIGH (ref 98–111)
Creatinine, Ser: 1.81 mg/dL — ABNORMAL HIGH (ref 0.44–1.00)
GFR calc Af Amer: 33 mL/min — ABNORMAL LOW (ref >60)
GFR calc non Af Amer: 29 mL/min — ABNORMAL LOW (ref >60)
GLUCOSE: 179 mg/dL — AB (ref 70–99)
Potassium: 3.7 mmol/L (ref 3.5–5.1)
Sodium: 151 mmol/L — ABNORMAL HIGH (ref 135–145)

## 2018-03-22 LAB — GLUCOSE, CAPILLARY
GLUCOSE-CAPILLARY: 133 mg/dL — AB (ref 70–99)
GLUCOSE-CAPILLARY: 169 mg/dL — AB (ref 70–99)
Glucose-Capillary: 112 mg/dL — ABNORMAL HIGH (ref 70–99)
Glucose-Capillary: 153 mg/dL — ABNORMAL HIGH (ref 70–99)
Glucose-Capillary: 160 mg/dL — ABNORMAL HIGH (ref 70–99)
Glucose-Capillary: 160 mg/dL — ABNORMAL HIGH (ref 70–99)

## 2018-03-22 LAB — VALPROIC ACID LEVEL: Valproic Acid Lvl: 62 ug/mL (ref 50.0–100.0)

## 2018-03-22 MED ORDER — DEXTROSE 5 % IV SOLN
INTRAVENOUS | Status: DC
  Administered 2018-03-22 – 2018-03-23 (×2): via INTRAVENOUS

## 2018-03-22 MED ORDER — CLONIDINE HCL 0.2 MG/24HR TD PTWK
0.2000 mg | MEDICATED_PATCH | TRANSDERMAL | Status: DC
  Administered 2018-03-22: 0.2 mg via TRANSDERMAL
  Filled 2018-03-22: qty 1

## 2018-03-22 MED ORDER — INSULIN ASPART 100 UNIT/ML ~~LOC~~ SOLN
0.0000 [IU] | SUBCUTANEOUS | Status: DC
  Administered 2018-03-22: 1 [IU] via SUBCUTANEOUS
  Administered 2018-03-22 (×2): 2 [IU] via SUBCUTANEOUS
  Administered 2018-03-23 (×2): 1 [IU] via SUBCUTANEOUS
  Administered 2018-03-23: 2 [IU] via SUBCUTANEOUS
  Administered 2018-03-23: 1 [IU] via SUBCUTANEOUS

## 2018-03-22 NOTE — Evaluation (Signed)
Clinical/Bedside Swallow Evaluation Patient Details  Name: Andrea [name] MRN: [id number] Date of Birth: 06/17/1950  Today's Date: 03/22/2018 Time: SLP Start Time (ACUTE ONLY): 28 SLP Stop Time (ACUTE ONLY): 1150 SLP Time Calculation (min) (ACUTE ONLY): 20 min  Past Medical History:  Past Medical History:  Diagnosis Date  . Breast cancer ([place])    bilat mastectomy and LUE lymphadenectomy 2014, chemoRx 2014 - 15  . Diabetes mellitus without complication ([place])   . Esophageal reflux   . Hypertension   . Hypothyroid   . Migraine   . Seizure [facility])    Past Surgical History:  Past Surgical History:  Procedure Laterality Date  . ABDOMINAL HYSTERECTOMY     1984 , done for ruptured cyst and endometriosis  . APPENDECTOMY    . CHOLECYSTECTOMY     1980's  . CORONARY STENT INTERVENTION N/A 12/30/2017   Procedure: CORONARY STENT INTERVENTION;  Surgeon: Martinique, Peter M, MD;  Location: [place] [facility];  Service: Cardiovascular;  Laterality: N/A;  . EYE SURGERY  08/28/2015   Right eye  . KNEE SURGERY Left   . MASTECTOMY     bilateral  . Open surgery for bowel obstruction, 2000's    . RIGHT/LEFT HEART CATH AND CORONARY ANGIOGRAPHY N/A 12/29/2017   Procedure: RIGHT/LEFT HEART CATH AND CORONARY ANGIOGRAPHY;  Surgeon: Martinique, Peter M, MD;  Location: [place] [facility];  Service: Cardiovascular;  Laterality: N/A;   HPI:  Andrea [name] is a 68 y.o. female with medical history significant of severe triple-vessel coronary artery disease status post cardiac cath no PCI, poor candidate for bypass surgery, ischemic cardiomyopathy recently improved ejection fraction, multiple hospitalizations, diabetes on insulin, hypothyroidism, hypertension, seizure disorder, chronic pain disorder who presents to the emergency room with worsening shortness of breath and cough as well as chest pain.  Patient has been to the hospital multiple times.  She was recently admitted this month with congestive heart  failure exacerbation as well as fluctuating renal functions.  She was deemed a poor candidate for bypass and coronary artery disease not amenable to stenting.  Was doing poorly for last few days with more shortness of breath, today morning she woke up at about 6 with sudden onset, constant, persistent, severe left precordial chest pain, felt like elephant walking on her chest, associated with cough and wheezing along with a rattling noise on her breathing.  No nausea or vomiting.  No fever.  No sputum production.  Patient does have orthopnea which is mostly chronic.  Most recent chest xray is showing CHF.  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     Assessment / Plan / Recommendation Clinical Impression  Clinical swallowing evaluation was completed using ice chips, thin liquids via spoon, cup and straw and pureed material.  Limited cranial nerve exam was completed due to the patient's problem following all commands.  Reduced range of motion of all the structures was seen.  Lingual and labial strength was unable to be assessed.  Jaw strength appreared reduced.  Volitional cough was weak.  She presented with an oral dysphagia.  She was very slow to move pureed material from her oral cavity.  Swallow trigger was appreciated to palpation and overt s/s of aspiration were not seen.  Recommend a dysphagia 1 (pureed) diet with thin liquids.  She will need full assistance for feeding.  ST will follow up for therapeutic diet tolerance and possible diet advancement.  SLP Visit Diagnosis: Dysphagia, unspecified (R13.10)    Aspiration Risk  Mild aspiration risk    Diet Recommendation   Dysphagia 1 with thin liquids  Medication Administration: Whole meds with puree    Other  Recommendations Oral Care Recommendations: Oral care BID   Follow up Recommendations Other (comment)(^BD)      Frequency and Duration min 2x/week  2 weeks        Prognosis Prognosis for Safe Diet Advancement: Good Barriers to Reach Goals: Cognitive deficits      Swallow Study   General Date of Onset: 03/17/18 HPI: Andrea [name] is a 68 y.o. female with medical history significant of severe triple-vessel coronary artery disease status post cardiac cath no PCI, poor candidate for bypass surgery, ischemic cardiomyopathy recently improved ejection fraction, multiple hospitalizations, diabetes on insulin, hypothyroidism, hypertension, seizure disorder, chronic pain disorder who presents to the emergency room with worsening shortness of breath and cough as well as chest pain.  Patient has been to the hospital multiple times.  She was recently admitted this month with congestive heart failure exacerbation as well as fluctuating renal functions.  She was deemed a poor candidate for bypass and coronary artery disease not amenable to stenting.  Was doing poorly for last few days with more shortness of breath, today morning she woke up at about 6 with sudden onset, constant, persistent, severe left precordial chest pain, felt like elephant walking on her chest, associated with cough and wheezing along with a rattling noise on her breathing.  No nausea or vomiting.  No fever.  No sputum production.  Patient does have orthopnea which is mostly chronic.  Most recent chest xray is showing CHF.  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   Type of Study: Bedside Swallow Evaluation Previous Swallow Assessment: None noted at [facility] Diet Prior to this Study: NPO Temperature Spikes Noted: No History of Recent Intubation: No Behavior/Cognition: Alert;Requires cueing;Confused Oral Cavity Assessment: Dry Oral Care Completed by SLP: Yes Self-Feeding Abilities: Total assist Patient Positioning: Upright in bed Baseline Vocal Quality: Other (comment)(Limited verbal output.  ) Volitional Cough:  Weak Volitional Swallow: Unable to elicit    Oral/Motor/Sensory Function Overall Oral Motor/Sensory Function: Moderate impairment Facial ROM: Within Functional Limits Facial Symmetry: Within Functional Limits Lingual ROM: Reduced right;Reduced left Lingual Symmetry: Within Functional Limits Lingual Strength: Reduced Mandible: Impaired   Ice Chips Ice chips: Impaired Presentation: Spoon Oral Phase Impairments: Impaired mastication Oral Phase Functional Implications: Prolonged oral transit   Thin Liquid Thin Liquid: Within functional limits Presentation: Straw;Spoon;Cup    Nectar Thick Nectar Thick Liquid: Not tested   Honey Thick Honey Thick Liquid: Not tested   Puree Puree: Impaired Presentation: Spoon Oral Phase Impairments: Impaired mastication Oral Phase Functional Implications: Prolonged oral transit   Solid     Solid: Not tested     Shelly Flatten, MA, CCC-SLP Acute Rehab SLP [phone]  Lamar Sprinkles 03/22/2018,12:09 PM

## 2018-03-22 NOTE — Progress Notes (Signed)
Patient is more aroused today, she was able to eat about 20% of her lunch, fed to her by her son. She drank some fluids about 120 ml, drank some ensure about 20% and said she did not like it. She is able to tell me now that she is at the hospital and she is oriented to self. I did ask her if she took something, she said a bunch of drugs and she did not know what they were. I educated patient about the importance of not overdosing herself with her home medicines. She seems to understand; will continue to reinforce and monitor patient. Patient is given peri care; mouth is swabbed, and full linen changed because she had an unmeasured urine occurrence.

## 2018-03-22 NOTE — Progress Notes (Signed)
Patient is more aroused now, she is able to take her medications by mouth with apple. She is alert to self but disoriented to time place and situation. Will continue to monitor.

## 2018-03-22 NOTE — Progress Notes (Signed)
Patient had one unmeasured occurrence of dark brown emesis. MD is aware will continue to monitor. Patient 5% dextrose is not started because I am still trying to figure out where to get from.

## 2018-03-22 NOTE — Progress Notes (Signed)
PROGRESS NOTE    Andrea [name]  EHU:[id number] DOB: 05-Jul-1950 DOA: 03/15/2018 PCP: Medicine, [facility]     Brief Narrative:  Andrea [name] is a 68 year old with past medical history relevant for coronary artery disease status post PCI x2 in 12/2017, stage IV CKD hypothyroidism, hypertension, hyperlipidemia, chronic diastolic heart failure, seizure disorder, chronic pain who comes in chest pain as well as coughing and wheezing and found to have acute on chronic diastolic heart failure. Her course has been complicated by encephalopathy, requiring restraints.  New events last 24 hours / Subjective: She is much more alert today.  She is able to tell me that she is in a hospital, is oriented to self, is able to tell me her son's names.  She states that she is not in any pain.  States that she is not hungry.  She is able to follow some simple commands.  She does have bilateral hand mittens on, no longer restrained.  After my initial evaluation, RN notified me that patient had episode of emesis of brown content after taking some pills with applesauce.  Assessment & Plan:   Principal Problem:   Encephalopathy Active Problems:   Essential hypertension   Insulin dependent diabetes mellitus (HCC)   Hypothyroidism   History of seizures   Esophageal reflux   Acute CHF (congestive heart failure) (HCC)   Chest pain   Cardiomyopathy (HCC)   CHF (congestive heart failure) (HCC)   Anemia   GAD (generalized anxiety disorder)   Hyperkalemia   Acute toxic/metabolic encephalopathy -CT head, MRI brain without acute abnormality, did reveal chronic ischemic microangiopathy -Ammonia normal -EEG obtained, revealed sharp waves over left hemisphere -LTM EEG suggestive of moderate to severe encephalopathy of nonspecific etiology, metabolic causes favored -Appreciate neurology.  Recommend discontinuing/decreasing sedating medications.  Continue Depakote 1 g daily.  Supplement  B12.  Correct toxic/metabolic derangements.  Neurology signed off 3/1.  Acute on chronic diastolic heart failure -BNP 1379.9 -Daily weight, strict I/Os  -Appears euvolemic on examination today.  Hold off on further diuretics.  Chest pain -Initially complaining of chest pain on admission, troponin negative x3 -Continue Plavix, Imdur when able to take p.o.  AKI on chronic kidney disease stage IV -Baseline creatinine 2.4  -Avoid nephrotoxins  -Improved  Hypertension -Changed her p.o. medications to IV metoprolol, IV hydralazine  Hyperlipidemia -Continue atorvastatin when able to take p.o.  Hypothyroidism -TSH within normal -Continue Synthroid changed to IV   Type 2 diabetes, well controlled -Continue Lantus, sliding scale insulin  Chronic pain syndrome -Stop morphine  Depression/anxiety -Continue buspirone when able to take p.o.  Seizure disorder -Continue Depakote, changed to IV  Hypernatremia -Changed to D5 IV fluid.  Trend BMP  Poor p.o. intake -Secondary to altered mentation.  She is much more alert today.  Consulted SLP and dietitian.  If patient's intake does not improve in the next 24 hours, we will have to consider cortrack placement  Vomiting -Had episode of brown emesis this morning per RN report.  Check KUB   DVT prophylaxis: Subcutaneous heparin Code Status: Full code Family Communication: Spoke with son over the phone 2/29 Disposition Plan: Pending improvement in her encephalopathy  Consultants:   Neurology  Procedures:   None  Antimicrobials:  Anti-infectives (From admission, onward)   None       Objective: Vitals:   03/22/18 0613 03/22/18 0619 03/22/18 0622 03/22/18 0803  BP: (!) 152/59  (!) 164/79 (!) 174/82  Pulse: 66  63 60  Resp:   [name])  22 18  Temp:   97.6 F (36.4 C)   TempSrc:      SpO2:   98% 96%  Weight:  69.4 kg    Height:        Intake/Output Summary (Last 24 hours) at 03/22/2018 1056 Last data filed at 03/22/2018  4098 Gross per 24 hour  Intake 0 ml  Output 100 ml  Net -100 ml   Filed Weights   03/20/18 0039 03/20/18 0500 03/22/18 0619  Weight: 70.2 kg 69.8 kg 69.4 kg    Examination: General exam: Appears calm and comfortable  Respiratory system: Clear to auscultation. Respiratory effort normal. Cardiovascular system: S1 & S2 heard, RRR. No JVD, murmurs, rubs, gallops or clicks. No pedal edema. Gastrointestinal system: Abdomen is nondistended, soft and nontender. No organomegaly or masses felt. Central nervous system: Alert, oriented to self and place but not to time.  Follows simple commands, appears lethargic but not as somnolent as yesterday Extremities: Symmetric Skin: No rashes, lesions or ulcers   Data Reviewed: I have personally reviewed following labs and imaging studies  CBC: Recent Labs  Lab 03/16/18 0221 03/17/18 0434 03/18/18 0530 03/19/18 0626 03/21/18 0642  WBC 6.3 5.0 4.9 6.2 7.3  HGB 8.1* 7.7* 8.1* 9.0* 9.7*  HCT 26.5* 26.6* 26.2* 30.1* 29.8*  MCV 94.6 99.3 97.8 97.1 90.9  PLT 200 188 215 181 119   Basic Metabolic Panel: Recent Labs  Lab 03/17/18 0434 03/18/18 0530 03/19/18 0626 03/20/18 0537 03/21/18 0642 03/22/18 0347  NA 141 144 145 147* 151* 151*  K 5.4* 5.4* 5.3* 4.4 4.0 3.7  CL 102 104 108 108 111 113*  CO2 26 30 24 28 31 29   GLUCOSE 93 114* 124* 206* 160* 179*  BUN 85* 95* 98* 104* 106* 111*  CREATININE 3.16* 2.97* 2.39* 2.09* 1.80* 1.81*  CALCIUM 8.7* 8.5* 8.9 8.7* 8.8* 8.5*  MG 2.4 2.5*  --   --   --   --   PHOS  --  4.3  --   --   --   --    GFR: Estimated Creatinine Clearance: 29.2 mL/min (A) (by C-G formula based on SCr of 1.81 mg/dL (H)). Liver Function Tests: Recent Labs  Lab 03/18/18 0530 03/18/18 0817  AST  --  36  ALT  --  17  ALKPHOS  --  35*  BILITOT  --  0.6  PROT  --  7.1  ALBUMIN 2.9* 3.0*   No results for input(s): LIPASE, AMYLASE in the last 168 hours. Recent Labs  Lab 03/18/18 0817  AMMONIA 31   Coagulation  Profile: No results for input(s): INR, PROTIME in the last 168 hours. Cardiac Enzymes: Recent Labs  Lab 03/15/18 1402 03/15/18 1948 03/16/18 0221  TROPONINI <0.03 0.03* 0.03*   BNP (last 3 results) No results for input(s): PROBNP in the last 8760 hours. HbA1C: No results for input(s): HGBA1C in the last 72 hours. CBG: Recent Labs  Lab 03/21/18 1126 03/21/18 1654 03/21/18 2117 03/22/18 0624 03/22/18 1051  GLUCAP 139* 158* 153* 153* 112*   Lipid Profile: No results for input(s): CHOL, HDL, LDLCALC, TRIG, CHOLHDL, LDLDIRECT in the last 72 hours. Thyroid Function Tests: No results for input(s): TSH, T4TOTAL, FREET4, T3FREE, THYROIDAB in the last 72 hours. Anemia Panel: No results for input(s): VITAMINB12, FOLATE, FERRITIN, TIBC, IRON, RETICCTPCT in the last 72 hours. Sepsis Labs: No results for input(s): PROCALCITON, LATICACIDVEN in the last 168 hours.  No results found for this or any previous visit (from  the past 240 hour(s)).     Radiology Studies: No results found.    Scheduled Meds: . amLODipine  10 mg Oral Daily  . atorvastatin  80 mg Oral QHS  . busPIRone  15 mg Oral BID  . cloNIDine  0.2 mg Transdermal Q Sat  . clopidogrel  75 mg Oral Daily  . cyanocobalamin  1,000 mcg Intramuscular Daily  . heparin  5,000 Units Subcutaneous Q8H  . hydrALAZINE  10 mg Intravenous Q8H  . insulin aspart  0-9 Units Subcutaneous Q4H  . insulin glargine  10 Units Subcutaneous QHS  . isosorbide mononitrate  30 mg Oral Daily  . levothyroxine  100 mcg Intravenous Q24H  . metoprolol tartrate  2.5 mg Intravenous Q6H   Continuous Infusions: . sodium chloride 250 mL (03/19/18 1735)  . dextrose    . valproate sodium 250 mg (03/22/18 0609)     LOS: 5 days    Time spent: 25 minutes   Dessa Phi, DO [facility] [email or web address] 03/22/2018, 10:56 AM

## 2018-03-22 NOTE — Progress Notes (Addendum)
Neurology Progress Note S:// Patient alert, easily aroused to name calling. NAD on 2 L [place]  O:// Current vital signs: BP (!) 185/82 (BP Location: Right Wrist)   Pulse 77   Temp (!) 97.5 F (36.4 C) (Oral)   Resp 14   Ht 5\' 4"  (1.626 m)   Wt 69.8 kg   SpO2 95%   BMI 26.41 kg/m  Vital signs in last 24 hours: Temp:  [97.5 F (36.4 C)] 97.5 F (36.4 C) (02/28 0500) Pulse Rate:  [77-84] 77 (02/28 1122) Resp:  [14-22] 14 (02/28 1122) BP: (168-185)/(74-85) 185/82 (02/28 1122) SpO2:  [90 %-95 %] 95 % (02/28 1122) Weight:  [69.8 kg-70.2 kg] 69.8 kg (02/28 0500)  General: drowsy,  slow to respond to questions HEENT: Normocephalic atraumatic CVs: Q3-E0 heard regular rate rhythm Respiratory: Chest clear to auscultation Extremities:  Edematous Neurological exam Patient is drowsy, alert, oriented to self , in a hospital, city, dd not know name of hospital or month.  Able to follow commands this morning. Still Bradyphrenic  Cranial nerves: Pupils equal round react light, extraocular movements intact, visual fields full, face symmetric, facial sensation intact, tongue midline, palate midline. Motor / sensory exam:  Moved all 4 extremities  Antigravity.  Cerebellar: UTA  Medications  Current Facility-Administered Medications:  .  0.9 %  sodium chloride infusion, , Intravenous, PRN, Dessa Phi, DO, Last Rate: 10 mL/hr at 03/19/18 1735, 250 mL at 03/19/18 1735 .  acetaminophen (TYLENOL) tablet 650 mg, 650 mg, Oral, Q6H PRN, 650 mg at 03/15/18 1509 **OR** acetaminophen (TYLENOL) suppository 650 mg, 650 mg, Rectal, Q6H PRN, [name], MD .  albuterol (PROVENTIL) (2.5 MG/3ML) 0.083% nebulizer solution 2.5 mg, 2.5 mg, Nebulization, Q2H PRN, [name], MD .  amLODipine (NORVASC) tablet 10 mg, 10 mg, Oral, Daily, Dessa Phi, DO .  atorvastatin (LIPITOR) tablet 80 mg, 80 mg, Oral, QHS, Ghimire, Dante Gang, MD, 80 mg at 03/17/18 2122 .  busPIRone (BUSPAR) tablet 15 mg, 15 mg, Oral,  BID, [name], MD, 15 mg at 03/19/18 2307 .  carvedilol (COREG) tablet 12.5 mg, 12.5 mg, Oral, BID WC, Dessa Phi, DO .  clopidogrel (PLAVIX) tablet 75 mg, 75 mg, Oral, Daily, Ghimire, Kuber, MD, 75 mg at 03/19/18 1157 .  haloperidol (HALDOL) tablet 5 mg, 5 mg, Oral, Q6H PRN **OR** haloperidol lactate (HALDOL) injection 5 mg, 5 mg, Intramuscular, Q6H PRN, Purohit, Shrey C, MD, 5 mg at 03/18/18 0335 .  heparin injection 5,000 Units, 5,000 Units, Subcutaneous, Q8H, Ghimire, Dante Gang, MD, 5,000 Units at 03/20/18 0600 .  hydrALAZINE (APRESOLINE) injection 5 mg, 5 mg, Intravenous, Q4H PRN, Dessa Phi, DO, 5 mg at 03/20/18 1212 .  hydrALAZINE (APRESOLINE) tablet 100 mg, 100 mg, Oral, Q8H, Ghimire, Kuber, MD, 100 mg at 03/19/18 2258 .  insulin aspart (novoLOG) injection 0-5 Units, 0-5 Units, Subcutaneous, QHS, Ghimire, Kuber, MD .  insulin aspart (novoLOG) injection 0-9 Units, 0-9 Units, Subcutaneous, TID WC, [name], MD, 2 Units at 03/20/18 1216 .  insulin glargine (LANTUS) injection 10 Units, 10 Units, Subcutaneous, QHS, [name], MD, 10 Units at 03/20/18 0100 .  isosorbide mononitrate (IMDUR) 24 hr tablet 30 mg, 30 mg, Oral, Daily, Ghimire, Kuber, MD, 30 mg at 03/19/18 1158 .  labetalol (NORMODYNE,TRANDATE) injection 5 mg, 5 mg, Intravenous, Q2H PRN, Dessa Phi, DO .  levothyroxine (SYNTHROID, LEVOTHROID) tablet 200 mcg, 200 mcg, Oral, QAC breakfast, [name], MD, 200 mcg at 03/18/18 0950 .  LORazepam (ATIVAN) injection 1 mg, 1 mg, Intravenous, Once,  Schorr, Rhetta Mura, NP .  morphine (MS CONTIN) 12 hr tablet 30 mg, 30 mg, Oral, BID, Dessa Phi, DO, 30 mg at 03/19/18 1157 .  ondansetron (ZOFRAN) injection 4 mg, 4 mg, Intravenous, Q8H PRN, Blount, Xenia T, NP, 4 mg at 03/16/18 6063 .  QUEtiapine (SEROQUEL) tablet 50 mg, 50 mg, Oral, QHS, Dessa Phi, DO, 50 mg at 03/19/18 2313 .  valproate (DEPACON) 250 mg in dextrose 5 % 50 mL IVPB, 250 mg, Intravenous, Q6H,  Dang, Thuy D, RPH, Last Rate: 52.5 mL/hr at 03/20/18 1219, 250 mg at 03/20/18 1219   Labs 3/1//2020: NA: 151 BG; 179 BUN: 111 GFR:29 creatinine 1.81 cl: 113 VPA level 62  Imaging MRI 03/17/2018 unremarkable for acute process.  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 CT head done on presentation 03/17/2018 unremarkable for new process.  Laurey Morale, MSN, NP-C [facility] [phone]  Attending Neurologist note to follow:  ATTENDING ADDENDUM Patient seen and examined. Agree with the exam above that I independently performed and confirmed.  Assessment: 68 year old woman presenting with shortness of breath and chest pain, being treated for CHF exacerbation.  Neurological consultation for patient b/c she is encephalopathic and agitated during this admission. Initial thought process was that patient has multiple medications with sedating side effects, and in the setting of drainage renal function, this could cause encephalopathy. Due to her history of seizure disorder, a spot EEG was done that showed possible sharps over the left hemisphere, so a LTM EEG was done LTM EEG overnight did not reveal any evidence of clinical or subclinical seizures. Findings consistent with metabolic enceph. She is currently on Depakote, levels had dropped to 17, but are therapeutic now with additional doses.  I still believe that this is continuing toxic metabolic encephalopathy due to deranged metabolic panel as well as possible medication overuse which she has a known history of. No meningitic signs on exam.  Do not see the value of LP at this time.  Impression: Toxic metabolic encephalopathy History of seizures - concern for breakthrough seizure- unremarkable EEG for seizures  Recommendations: Decrease sedating medications.  Discontinued Seroquel.   Continue MS Contin dose-15 twice daily. (half of  home dose) Continue Depakote for a total dose of 1000 daily Continue to correct toxic metabolic derangements-especially hypernatremia. Supplement B12 parenterally aggressively Follow up with OP neurology 4-6 weeks after discharge  Neurology to sign- off at this time, please call with any further concerns. We will be available as needed.  Discussed today's exam and relayed plan to Dr. Maylene Roes over the phone.   -- Amie [place], MD [facility] Pager: [phone] If 7pm to 7am, please call on call as listed on AMION.

## 2018-03-23 LAB — GLUCOSE, CAPILLARY
Glucose-Capillary: 151 mg/dL — ABNORMAL HIGH (ref 70–99)
Glucose-Capillary: 147 mg/dL — ABNORMAL HIGH (ref 70–99)
Glucose-Capillary: 133 mg/dL — ABNORMAL HIGH (ref 70–99)
Glucose-Capillary: 121 mg/dL — ABNORMAL HIGH (ref 70–99)
Glucose-Capillary: 175 mg/dL — ABNORMAL HIGH (ref 70–99)
Glucose-Capillary: 113 mg/dL — ABNORMAL HIGH (ref 70–99)
Glucose-Capillary: 116 mg/dL — ABNORMAL HIGH (ref 70–99)
Glucose-Capillary: 122 mg/dL — ABNORMAL HIGH (ref 70–99)

## 2018-03-23 LAB — BASIC METABOLIC PANEL
Anion gap: 9 (ref 5–15)
BUN: 117 mg/dL — ABNORMAL HIGH (ref 8–23)
CALCIUM: 7.5 mg/dL — AB (ref 8.9–10.3)
CO2: 26 mmol/L (ref 22–32)
Chloride: 111 mmol/L (ref 98–111)
Creatinine, Ser: 2.14 mg/dL — ABNORMAL HIGH (ref 0.44–1.00)
GFR calc Af Amer: 27 mL/min — ABNORMAL LOW (ref >60)
GFR calc non Af Amer: 23 mL/min — ABNORMAL LOW (ref >60)
Glucose, Bld: 164 mg/dL — ABNORMAL HIGH (ref 70–99)
Potassium: 3.4 mmol/L — ABNORMAL LOW (ref 3.5–5.1)
Sodium: 146 mmol/L — ABNORMAL HIGH (ref 135–145)

## 2018-03-23 MED ORDER — LEVOTHYROXINE SODIUM 100 MCG PO TABS
200.0000 ug | ORAL_TABLET | Freq: Every day | ORAL | Status: DC
  Administered 2018-03-23 – 2018-03-27 (×5): 200 ug via ORAL
  Filled 2018-03-23 (×6): qty 2

## 2018-03-23 MED ORDER — PRO-STAT SUGAR FREE PO LIQD
30.0000 mL | Freq: Two times a day (BID) | ORAL | Status: DC
  Administered 2018-03-23 – 2018-03-26 (×6): 30 mL via ORAL
  Filled 2018-03-23 (×8): qty 30

## 2018-03-23 MED ORDER — POTASSIUM CHLORIDE CRYS ER 20 MEQ PO TBCR
40.0000 meq | EXTENDED_RELEASE_TABLET | Freq: Once | ORAL | Status: AC
  Administered 2018-03-23: 40 meq via ORAL
  Filled 2018-03-23: qty 2

## 2018-03-23 MED ORDER — ENSURE ENLIVE PO LIQD
237.0000 mL | Freq: Two times a day (BID) | ORAL | Status: DC
  Administered 2018-03-25 – 2018-03-26 (×3): 237 mL via ORAL

## 2018-03-23 MED ORDER — DIVALPROEX SODIUM 250 MG PO DR TAB
250.0000 mg | DELAYED_RELEASE_TABLET | Freq: Four times a day (QID) | ORAL | Status: DC
  Administered 2018-03-23 – 2018-03-25 (×7): 250 mg via ORAL
  Filled 2018-03-23 (×12): qty 1

## 2018-03-23 MED ORDER — ADULT MULTIVITAMIN W/MINERALS CH
1.0000 | ORAL_TABLET | Freq: Every day | ORAL | Status: DC
  Administered 2018-03-24 – 2018-03-27 (×4): 1 via ORAL
  Filled 2018-03-23 (×5): qty 1

## 2018-03-23 NOTE — Evaluation (Signed)
Occupational Therapy Evaluation Patient Details Name: Andrea [name] MRN: [id number] DOB: September 10, 1950 Today's Date: 03/23/2018    History of Present Illness Pt is a 68 y.o. female admitted with chest pain and SOB; CXR concerning for pulmonary edema. Worked up for CHF, Her course has been complicated by encephalopathy, requiring restraints. Of note, five recent admissions since 12/2017 with similar symptoms, s/p cardiac cath and PCI. PMH includes chronic pain, DM, seizures, anxiety/depression, BG and thalami lacunar infarcts, fluctuating renal function.    Clinical Impression   PTA Pt was Mod I with rollator, doing all ADL independently. Pt is currently max A for seated grooming tasks, max A for LB dressing, min A with RW for stand pivot transfers. Pt is limited by decreased cognition, decreased balance, decreased activity tolerance. Will require skilled OT in the acute setting as well as afterwards at the SNF level to maximize safety and independence in ADL and functional transfers. Next session to focus on standing balance at sink and energy conservation education.     Follow Up Recommendations  SNF;Supervision/Assistance - 24 hour    Equipment Recommendations  Other (comment)(defer to next venue of care)    Recommendations for Other Services       Precautions / Restrictions Precautions Precautions: Fall Precaution Comments: Watch SpO2 Restrictions Weight Bearing Restrictions: No      Mobility Bed Mobility Overal bed mobility: Needs Assistance Bed Mobility: Supine to Sit     Supine to sit: Min assist     General bed mobility comments: min A for trunk elevation and use of pad to bring hips EOB  Transfers Overall transfer level: Needs assistance Equipment used: Rolling walker (2 wheeled) Transfers: Sit to/from [facility] Sit to Stand: Min assist Stand pivot transfers: Min assist       General transfer comment: min A for boost from bed, vc for safe  hand placementable to take pivotal steps with min A to manage RW     Balance Overall balance assessment: Needs assistance Sitting-balance support: No upper extremity supported;Feet supported Sitting balance-Leahy Scale: Fair     Standing balance support: Bilateral upper extremity supported;During functional activity Standing balance-Leahy Scale: Poor Standing balance comment: dependent on RW for balance                           ADL either performed or assessed with clinical judgement   ADL Overall ADL's : Needs assistance/impaired Eating/Feeding: Supervision/ safety;Sitting Eating/Feeding Details (indicate cue type and reason): Pt has required feeding from hospital staff or son. not witnessed this session Grooming: Maximal assistance;Brushing hair;Sitting Grooming Details (indicate cue type and reason): Pt able to initiate, but due to matting she required extra assistance - fatigued very quickly even just holding her head up Upper Body Bathing: Minimal assistance;Sitting   Lower Body Bathing: Moderate assistance;Sit to/from stand   Upper Body Dressing : Minimal assistance;Sitting Upper Body Dressing Details (indicate cue type and reason): to don extra down like robe Lower Body Dressing: Moderate assistance;Sit to/from stand   Toilet Transfer: Minimal assistance;Stand-pivot;RW Armed forces technical officer Details (indicate cue type and reason): assist for boost Toileting- Clothing Manipulation and Hygiene: Minimal assistance;Sit to/from stand Toileting - Clothing Manipulation Details (indicate cue type and reason): assist for boost and to manage gowns     Functional mobility during ADLs: Minimal assistance;Rolling walker(SPT only) General ADL Comments: decreased cognition, generalized weakness     Vision         Perception  Praxis      Pertinent Vitals/Pain Pain Assessment: No/denies pain Pain Intervention(s): Monitored during session     Hand Dominance Right    Extremity/Trunk Assessment Upper Extremity Assessment Upper Extremity Assessment: Generalized weakness   Lower Extremity Assessment Lower Extremity Assessment: Defer to PT evaluation   Cervical / Trunk Assessment Cervical / Trunk Assessment: Normal   Communication Communication Communication: No difficulties   Cognition Arousal/Alertness: Awake/alert Behavior During Therapy: WFL for tasks assessed/performed;Flat affect Overall Cognitive Status: Impaired/Different from baseline Area of Impairment: Memory;Following commands;Safety/judgement;Awareness;Problem solving                   Current Attention Level: Alternating Memory: Decreased short-term memory Following Commands: Follows one step commands with increased time;Follows one step commands consistently Safety/Judgement: Decreased awareness of safety;Decreased awareness of deficits Awareness: Emergent Problem Solving: Requires verbal cues General Comments: Pt required cues for task completion, flat throughout but did laugh a few times appropriately. son in room for a short period and she deferred to him for answers to questions   General Comments       Exercises Exercises: Other exercises Other Exercises Other Exercises: flexion/extension of digits for edema management in Bilateral hands   Shoulder Instructions      Home Living Family/patient expects to be discharged to:: Private residence Living Arrangements: Alone Available Help at Discharge: Family;Available PRN/intermittently Type of Home: Apartment Home Access: Level entry     Home Layout: One level     Bathroom Shower/Tub: Tub only(garden tub)   Bathroom Toilet: Standard     Home Equipment: Bedside commode;Walker - 2 wheels;Shower seat;Grab bars - tub/shower   Additional Comments: does not have 24/7      Prior Functioning/Environment Level of Independence: Independent with assistive device(s)        Comments: uses a rollator at baseline,  Pt has a car and does everything herself        OT Problem List: Decreased activity tolerance;Impaired balance (sitting and/or standing);Decreased safety awareness;Decreased knowledge of use of DME or AE;Decreased knowledge of precautions;Cardiopulmonary status limiting activity;Increased edema      OT Treatment/Interventions: Self-care/ADL training;Therapeutic exercise;DME and/or AE instruction;Therapeutic activities;Patient/family education;Balance training    OT Goals(Current goals can be found in the care plan section) Acute Rehab OT Goals Patient Stated Goal: get better to play with Grandaughter OT Goal Formulation: With patient Time For Goal Achievement: 04/06/18 Potential to Achieve Goals: Good ADL Goals Pt Will Perform Grooming: with supervision;sitting Pt Will Perform Upper Body Bathing: with supervision;sitting Pt Will Perform Lower Body Bathing: with supervision;sit to/from stand Pt Will Transfer to Toilet: with supervision;ambulating Pt Will Perform Toileting - Clothing Manipulation and hygiene: sit to/from stand Additional ADL Goal #1: Pt will recall 3 ways of conserving energy with ADL at supervision level Additional ADL Goal #2: Pt will recall 2 ways to manage edema at supervision level  OT Frequency: Min 2X/week   Barriers to D/C:    Pt does not have 24/7 supervision       Co-evaluation              AM-PAC OT "6 Clicks" Daily Activity     Outcome Measure Help from another person eating meals?: A Lot Help from another person taking care of personal grooming?: A Lot Help from another person toileting, which includes using toliet, bedpan, or urinal?: A Lot Help from another person bathing (including washing, rinsing, drying)?: A Lot Help from another person to put on and taking off regular upper body clothing?:  A Little Help from another person to put on and taking off regular lower body clothing?: A Lot 6 Click Score: 13   End of Session Equipment Utilized  During Treatment: Gait belt;Rolling walker;Oxygen(2 L via [place]) Nurse Communication: Mobility status;Precautions;Other (comment)(chair alarm not plugged into call center (no wire))  Activity Tolerance: Patient tolerated treatment well(fatigues quickly) Patient left: in chair;with call bell/phone within reach;with chair alarm set  OT Visit Diagnosis: Unsteadiness on feet (R26.81);Other abnormalities of gait and mobility (R26.89);History of falling (Z91.81);Repeated falls (R29.6);Muscle weakness (generalized) (M62.81);Other symptoms and signs involving cognitive function;Adult, failure to thrive (R62.7)                Time: 0929-1005 OT Time Calculation (min): 36 min Charges:  OT General Charges $OT Visit: 1 Visit OT Evaluation $OT Eval Moderate Complexity: 1 Mod OT Treatments $Self Care/Home Management : 8-22 mins  Hulda Humphrey OTR/L Acute Rehabilitation Services Pager: [phone] Office: [phone]  Merri Ray Dain Laseter 03/23/2018, 1:50 PM

## 2018-03-23 NOTE — Progress Notes (Signed)
  Speech Language Pathology Treatment: Dysphagia  Patient Details Name: Andrea [name] MRN: [id number] DOB: 10/02/1950 Today's Date: 03/23/2018 Time: 3810-1751 SLP Time Calculation (min) (ACUTE ONLY): 25 min  Assessment / Plan / Recommendation Clinical Impression  Patient seen with breakfast tray. Staff assisting patient with meal. Patients verbal responses were delayed as well as delayed processing. With pureed foods she had prolonged bolus formation. She exhibited prolonged mastication and bolus formation with cracker trials. She tolerated liquids by cup and straw with no s/sx of aspiration. Recommend continuation of pureed consistencies and thin liquids. Will continue to monitor for safe diet consistencies and advancement when appropriate.    HPI HPI: Andrea [name] is a 68 y.o. female with medical history significant of severe triple-vessel coronary artery disease status post cardiac cath no PCI, poor candidate for bypass surgery, ischemic cardiomyopathy recently improved ejection fraction, multiple hospitalizations, diabetes on insulin, hypothyroidism, hypertension, seizure disorder, chronic pain disorder who presents to the emergency room with worsening shortness of breath and cough as well as chest pain.  Patient has been to the hospital multiple times.  She was recently admitted this month with congestive heart failure exacerbation as well as fluctuating renal functions.  She was deemed a poor candidate for bypass and coronary artery disease not amenable to stenting.  Was doing poorly for last few days with more shortness of breath, today morning she woke up at about 6 with sudden onset, constant, persistent, severe left precordial chest pain, felt like elephant walking on her chest, associated with cough and wheezing along with a rattling noise on her breathing.  No nausea or vomiting.  No fever.  No sputum production.  Patient does have orthopnea which is mostly chronic.  Most recent  chest xray is showing CHF.  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        SLP Plan  Continue with current plan of care       Recommendations  Diet recommendations: Dysphagia 1 (puree);Thin liquid Liquids provided via: Cup Medication Administration: Whole meds with puree Compensations: Minimize environmental distractions;Slow rate;Small sips/bites;Lingual sweep for clearance of pocketing Postural Changes and/or Swallow Maneuvers: Seated upright 90 degrees                Oral Care Recommendations: Oral care BID Plan: Continue with current plan of care       [place], MA, CCC-SLP 03/23/2018 9:04 AM

## 2018-03-23 NOTE — Evaluation (Signed)
Physical Therapy Evaluation Patient Details Name: Andrea [name] MRN: [id number] DOB: 1950-11-29 Today's Date: 03/23/2018   History of Present Illness  Pt is a 68 y.o. female admitted with chest pain and SOB; CXR concerning for pulmonary edema. Worked up for CHF, Her course has been complicated by encephalopathy, requiring restraints. Of note, five recent admissions since 12/2017 with similar symptoms, s/p cardiac cath and PCI. PMH includes chronic pain, DM, seizures, anxiety/depression, BG and thalami lacunar infarcts, fluctuating renal function.   Clinical Impression  Pt admitted with above diagnosis. Pt currently with functional limitations due to the deficits listed below (see PT Problem List). Pt was able to ambulate to door and back with min guard assist with RW.  Pt lethargic and difficult to arouse but did agree to walk a short distance.  Will follow acutely.   Pt will benefit from skilled PT to increase their independence and safety with mobility to allow discharge to the venue listed below.      Follow Up Recommendations SNF;Supervision/Assistance - 24 hour    Equipment Recommendations  (home O2)    Recommendations for Other Services       Precautions / Restrictions Precautions Precautions: Fall Precaution Comments: Watch SpO2 Restrictions Weight Bearing Restrictions: No      Mobility  Bed Mobility Overal bed mobility: Needs Assistance Bed Mobility: Supine to Sit     Supine to sit: Min assist     General bed mobility comments: Pt in chair.  Took a long time to arouse pt. Seemed she was just being behavioral as once pt engaged she cooperated with a walk to door and back to chair.   Transfers Overall transfer level: Needs assistance Equipment used: Rolling walker (2 wheeled) Transfers: Sit to/from [facility] Sit to Stand: Min assist Stand pivot transfers: Min assist       General transfer comment: min A for boost from chair, vc for safe  hand placement  Ambulation/Gait Ambulation/Gait assistance: Min guard;+2 safety/equipment Gait Distance (Feet): 50 Feet Assistive device: Rolling walker (2 wheeled) Gait Pattern/deviations: Step-through pattern;Decreased stride length;Drifts right/left;Shuffle Gait velocity: Decreased Gait velocity interpretation: 1.31 - 2.62 ft/sec, indicative of limited community ambulator General Gait Details: Pt agreed to walk to door and back only; followed wtih chair for safety as pt was very lethargic iniitally.  Pt needed cues and assist for RW and step sequencing.    Stairs            Wheelchair Mobility    Modified Rankin (Stroke Patients Only)       Balance Overall balance assessment: Needs assistance Sitting-balance support: No upper extremity supported;Feet supported Sitting balance-Leahy Scale: Fair     Standing balance support: Bilateral upper extremity supported;During functional activity Standing balance-Leahy Scale: Poor Standing balance comment: dependent on RW for balance                             Pertinent Vitals/Pain Pain Assessment: No/denies pain Pain Intervention(s): Monitored during session    Home Living Family/patient expects to be discharged to:: Private residence Living Arrangements: Alone Available Help at Discharge: Family;Available PRN/intermittently Type of Home: Apartment Home Access: Level entry     Home Layout: One level Home Equipment: Bedside commode;Walker - 2 wheels;Shower seat;Grab bars - tub/shower Additional Comments: does not have 24/7    Prior Function Level of Independence: Independent with assistive device(s)         Comments: uses a rollator at baseline, Pt  has a car and does everything herself     Hand Dominance   Dominant Hand: Right    Extremity/Trunk Assessment   Upper Extremity Assessment Upper Extremity Assessment: Defer to OT evaluation    Lower Extremity Assessment Lower Extremity Assessment:  Generalized weakness    Cervical / Trunk Assessment Cervical / Trunk Assessment: Normal  Communication   Communication: No difficulties  Cognition Arousal/Alertness: Awake/alert Behavior During Therapy: WFL for tasks assessed/performed;Flat affect Overall Cognitive Status: Impaired/Different from baseline Area of Impairment: Memory;Following commands;Safety/judgement;Awareness;Problem solving                   Current Attention Level: Alternating Memory: Decreased short-term memory Following Commands: Follows one step commands with increased time;Follows one step commands consistently Safety/Judgement: Decreased awareness of safety;Decreased awareness of deficits Awareness: Emergent Problem Solving: Requires verbal cues General Comments: Pt required cues for task completion, flat throughout but did laugh a few times appropriately. son in room for a short period and she deferred to him for answers to questions      General Comments General comments (skin integrity, edema, etc.): O2 left in place at 2L with sats >90%.      Exercises General Exercises - Lower Extremity Ankle Circles/Pumps: AROM;Both;10 reps;Seated Long Arc Quad: AROM;Both;10 reps;Seated Other Exercises Other Exercises: flexion/extension of digits for edema management in Bilateral hands   Assessment/Plan    PT Assessment Patient needs continued PT services  PT Problem List Decreased activity tolerance;Decreased balance;Decreased mobility;Decreased knowledge of use of DME;Decreased safety awareness;Decreased knowledge of precautions;Cardiopulmonary status limiting activity       PT Treatment Interventions DME instruction;Gait training;Functional mobility training;Therapeutic activities;Therapeutic exercise;Balance training;Patient/family education    PT Goals (Current goals can be found in the Care Plan section)  Acute Rehab PT Goals Patient Stated Goal: get better to play with Grandaughter PT Goal  Formulation: With patient Time For Goal Achievement: 04/06/18 Potential to Achieve Goals: Good    Frequency Min 2X/week   Barriers to discharge Decreased caregiver support      Co-evaluation               AM-PAC PT "6 Clicks" Mobility  Outcome Measure Help needed turning from your back to your side while in a flat bed without using bedrails?: A Little Help needed moving from lying on your back to sitting on the side of a flat bed without using bedrails?: A Little Help needed moving to and from a bed to a chair (including a wheelchair)?: A Little Help needed standing up from a chair using your arms (e.g., wheelchair or bedside chair)?: A Little Help needed to walk in hospital room?: A Little Help needed climbing 3-5 steps with a railing? : A Little 6 Click Score: 18    End of Session Equipment Utilized During Treatment: Gait belt;Oxygen Activity Tolerance: Patient tolerated treatment well;Patient limited by fatigue Patient left: with call bell/phone within reach;in chair;with chair alarm set Nurse Communication: Mobility status PT Visit Diagnosis: Unsteadiness on feet (R26.81);Muscle weakness (generalized) (M62.81)    Time: 6578-4696 PT Time Calculation (min) (ACUTE ONLY): 18 min   Charges:   PT Evaluation $PT Eval Moderate Complexity: [place] Pager:  [phone]  Office:  [phone]    Denice Paradise 03/23/2018, 2:13 PM

## 2018-03-23 NOTE — Progress Notes (Signed)
PROGRESS NOTE    Andrea [name]  LSL:[id number] DOB: 1950-08-26 DOA: 03/15/2018 PCP: Medicine, [facility]     Brief Narrative:  Andrea [name] is a 68 year old with past medical history relevant for coronary artery disease status post PCI x2 in 12/2017, stage IV CKD hypothyroidism, hypertension, hyperlipidemia, chronic diastolic heart failure, seizure disorder, chronic pain who comes in chest pain as well as coughing and wheezing and found to have acute on chronic diastolic heart failure. Her course has been complicated by encephalopathy, requiring restraints.  New events last 24 hours / Subjective: Encephalopathy continues to improve.  She is alert, oriented to self, that she is in the hospital, [place], year 2020.  When asked about events leading to prior to hospitalization, she states that she probably took too much of her medications.  States that she took at least Xanax and a pain medicine, denies any intentional overdose or self-harm.  Assessment & Plan:   Principal Problem:   Encephalopathy Active Problems:   Essential hypertension   Insulin dependent diabetes mellitus (HCC)   Hypothyroidism   History of seizures   Esophageal reflux   Acute CHF (congestive heart failure) (HCC)   Chest pain   Cardiomyopathy (HCC)   CHF (congestive heart failure) (HCC)   Anemia   GAD (generalized anxiety disorder)   Hyperkalemia   Acute toxic/metabolic encephalopathy -CT head, MRI brain without acute abnormality, did reveal chronic ischemic microangiopathy -Ammonia normal -EEG obtained, revealed sharp waves over left hemisphere -LTM EEG suggestive of moderate to severe encephalopathy of nonspecific etiology, metabolic causes favored -Appreciate neurology.  Recommend discontinuing/decreasing sedating medications.  Continue Depakote 1 g daily.  Supplement B12.  Correct toxic/metabolic derangements.  Neurology signed off 3/1. -Slowly improving  Acute on chronic  diastolic heart failure -BNP 1379.9 -Daily weight, strict I/Os  -Appears euvolemic on examination today.  Hold off on further diuretics.  Chest pain -Initially complaining of chest pain on admission, troponin negative x3 -Continue Plavix, Imdur  -Stable, no chest pain complaint today   AKI on chronic kidney disease stage IV -Baseline creatinine 2.4  -Avoid nephrotoxins  -IVF -Trend BMP  Hypertension -Changed her p.o. medications to IV metoprolol, IV hydralazine -Continue Norvasc -Catapres patch ordered due to patient's poor p.o., may discontinue this in the next day or 2 as long as her mentation and her p.o. intake remains stable  Hyperlipidemia -Continue atorvastatin   Hypothyroidism -TSH within normal -Continue Synthroid  Type 2 diabetes, well controlled -Continue Lantus, sliding scale insulin  Chronic pain syndrome -Stop morphine  Depression/anxiety -Continue buspirone   Seizure disorder -Continue Depakote  Hypernatremia -Changed to D5 IV fluid.  Trend BMP.  Improving  Poor p.o. intake -Secondary to altered mentation.  She is much more alert today.  Consulted SLP and dietitian.  Improving now that her mentation is better  Hypokalemia -Replace, trend   DVT prophylaxis: Subcutaneous heparin Code Status: Full code Family Communication: No family at bedside Disposition Plan: Pending improvement in her encephalopathy.  PT OT consulted  Consultants:   Neurology  Procedures:   None  Antimicrobials:  Anti-infectives (From admission, onward)   None       Objective: Vitals:   03/23/18 0013 03/23/18 0227 03/23/18 0543 03/23/18 0908  BP: (!) 167/76 (!) 168/64 (!) 143/58 (!) 152/69  Pulse: (!) 59 66 66 63  Resp:   18   Temp:   (!) 97.5 F (36.4 C)   TempSrc:      SpO2: 98% 97% 100% 100%  Weight:  69.5 kg   Height:        Intake/Output Summary (Last 24 hours) at 03/23/2018 1053 Last data filed at 03/23/2018 0848 Gross per 24 hour  Intake  2281.73 ml  Output -  Net 2281.73 ml   Filed Weights   03/20/18 0500 03/22/18 0619 03/23/18 0543  Weight: 69.8 kg 69.4 kg 69.5 kg    Examination: General exam: Appears calm and comfortable  Respiratory system: Clear to auscultation. Respiratory effort normal. Cardiovascular system: S1 & S2 heard, RRR. No JVD, murmurs, rubs, gallops or clicks. No pedal edema. Gastrointestinal system: Abdomen is nondistended, soft and nontender. No organomegaly or masses felt. Normal bowel sounds heard. Central nervous system: Alert and oriented x3. No focal neurological deficits. Extremities: Symmetric Skin: No rashes, lesions or ulcers Psychiatry: Judgement and insight appear improved   Data Reviewed: I have personally reviewed following labs and imaging studies  CBC: Recent Labs  Lab 03/17/18 0434 03/18/18 0530 03/19/18 0626 03/21/18 0642  WBC 5.0 4.9 6.2 7.3  HGB 7.7* 8.1* 9.0* 9.7*  HCT 26.6* 26.2* 30.1* 29.8*  MCV 99.3 97.8 97.1 90.9  PLT 188 215 181 947   Basic Metabolic Panel: Recent Labs  Lab 03/17/18 0434 03/18/18 0530 03/19/18 0626 03/20/18 0537 03/21/18 0642 03/22/18 0347 03/23/18 0443  NA 141 144 145 147* 151* 151* 146*  K 5.4* 5.4* 5.3* 4.4 4.0 3.7 3.4*  CL 102 104 108 108 111 113* 111  CO2 26 30 24 28 31 29 26   GLUCOSE 93 114* 124* 206* 160* 179* 164*  BUN 85* 95* 98* 104* 106* 111* 117*  CREATININE 3.16* 2.97* 2.39* 2.09* 1.80* 1.81* 2.14*  CALCIUM 8.7* 8.5* 8.9 8.7* 8.8* 8.5* 7.5*  MG 2.4 2.5*  --   --   --   --   --   PHOS  --  4.3  --   --   --   --   --    GFR: Estimated Creatinine Clearance: 24.7 mL/min (A) (by C-G formula based on SCr of 2.14 mg/dL (H)). Liver Function Tests: Recent Labs  Lab 03/18/18 0530 03/18/18 0817  AST  --  36  ALT  --  17  ALKPHOS  --  35*  BILITOT  --  0.6  PROT  --  7.1  ALBUMIN 2.9* 3.0*   No results for input(s): LIPASE, AMYLASE in the last 168 hours. Recent Labs  Lab 03/18/18 0817  AMMONIA 31   Coagulation  Profile: No results for input(s): INR, PROTIME in the last 168 hours. Cardiac Enzymes: No results for input(s): CKTOTAL, CKMB, CKMBINDEX, TROPONINI in the last 168 hours. BNP (last 3 results) No results for input(s): PROBNP in the last 8760 hours. HbA1C: No results for input(s): HGBA1C in the last 72 hours. CBG: Recent Labs  Lab 03/22/18 1750 03/22/18 2125 03/22/18 2335 03/23/18 0232 03/23/18 0624  GLUCAP 160* 169* 160* 151* 147*   Lipid Profile: No results for input(s): CHOL, HDL, LDLCALC, TRIG, CHOLHDL, LDLDIRECT in the last 72 hours. Thyroid Function Tests: No results for input(s): TSH, T4TOTAL, FREET4, T3FREE, THYROIDAB in the last 72 hours. Anemia Panel: No results for input(s): VITAMINB12, FOLATE, FERRITIN, TIBC, IRON, RETICCTPCT in the last 72 hours. Sepsis Labs: No results for input(s): PROCALCITON, LATICACIDVEN in the last 168 hours.  No results found for this or any previous visit (from the past 240 hour(s)).     Radiology Studies: Dg Abd 1 View  Result Date: 03/22/2018 CLINICAL DATA:  Acute vomiting. EXAM: ABDOMEN - 1  VIEW COMPARISON:  03/01/2018 CT and prior studies . FINDINGS: The bowel gas pattern is unremarkable. No dilated bowel loops noted. No suspicious calcifications are present. Cholecystectomy clips are present. IMPRESSION: No acute abnormality. Electronically Signed   By: Margarette Canada M.D.   On: 03/22/2018 13:51      Scheduled Meds: . amLODipine  10 mg Oral Daily  . atorvastatin  80 mg Oral QHS  . busPIRone  15 mg Oral BID  . cloNIDine  0.2 mg Transdermal Q Sat  . clopidogrel  75 mg Oral Daily  . cyanocobalamin  1,000 mcg Intramuscular Daily  . divalproex  250 mg Oral Q6H  . heparin  5,000 Units Subcutaneous Q8H  . hydrALAZINE  10 mg Intravenous Q8H  . insulin aspart  0-9 Units Subcutaneous Q4H  . insulin glargine  10 Units Subcutaneous QHS  . isosorbide mononitrate  30 mg Oral Daily  . levothyroxine  200 mcg Oral Q0600  . metoprolol tartrate   2.5 mg Intravenous Q6H   Continuous Infusions: . sodium chloride 250 mL (03/19/18 1735)  . dextrose 75 mL/hr at 03/23/18 0505     LOS: 6 days    Time spent: 25 minutes   Dessa Phi, DO [facility] [email or web address] 03/23/2018, 10:53 AM

## 2018-03-23 NOTE — Progress Notes (Signed)
Pt was hard to arouse even with sternal rub. VS checked (see flowsheets). Paged Rapid response. MD made aware.

## 2018-03-23 NOTE — Progress Notes (Addendum)
Initial Nutrition Assessment  DOCUMENTATION CODES:   Not applicable  INTERVENTION:   -Initiate 48 hour calorie count -Ensure Enlive po BID, each supplement provides 350 kcal and 20 grams of protein -Magic Cup TID with meals, each supplement provides 290 kcals and 9 grams protein -30 ml Prostat BID, each supplement provides 100 kcals and 15 grams protein -MVI with minerals daily -Feeding assistance with meals  NUTRITION DIAGNOSIS:   Inadequate oral intake related to lethargy/confusion as evidenced by meal completion < 25%.  GOAL:   Patient will meet greater than or equal to 90% of their needs  MONITOR:   PO intake, Supplement acceptance, Diet advancement, Labs, Weight trends, Skin, I & O's  REASON FOR ASSESSMENT:   Consult Poor PO, Assessment of nutrition requirement/status  ASSESSMENT:   Andrea [name] is a 68 y.o. female with medical history significant of severe triple-vessel coronary artery disease status post cardiac cath no PCI, poor candidate for bypass surgery, ischemic cardiomyopathy recently improved ejection fraction, multiple hospitalizations, diabetes on insulin, hypothyroidism, hypertension, seizure disorder, chronic pain disorder who presents to the emergency room with worsening shortness of breath and cough as well as chest pain.  Patient has been to the hospital multiple times.  He was recently admitted this month with congestive heart failure exacerbation as well as fluctuating renal functions.  She was deemed a poor candidate for bypass and coronary artery disease not amenable to stenting.  Was doing poorly for last few days with more shortness of breath, today morning she woke up at about 6 with sudden onset, constant, persistent, severe left precordial chest pain, felt like elephant walking on her chest, associated with cough and wheezing along with a rattling noise on her breathing.  No nausea or vomiting.  No fever.  No sputum production.  Patient does  have orthopnea which is mostly chronic  Pt admitted with chest pain.   2/25- rapid response due to difficulty to arouse 2/26- rapid response due to agitation 3/1- s/p BSE- dysphagia 1 diet with thin liquids  Reviewed I/O's: +2 L x 24 hours and +1.7 L since admission  Pt sitting in recliner chair at time of visit. Pt had her eyes open but would not maintain eye contact or interact with this RD.   Pt with poor oral intake this admission (meal completion 0-25%) secondary to encephalopathy and requiring restraints at times. Per RN documentation, pt was spitting out medications over the weekend. Pt consumed a few sips of Ensure supplement on tray table.   Reviewed wt hx; wt has remains stable.   If poor oral intake and poor mental status persists, will need to consider alternate means of nutrition (ex enteral nutrition) to help pt meet nutritional goals.   Lab Results  Component Value Date   HGBA1C 6.4 (H) 03/15/2018   PTA DM medications are none.   Labs reviewed: CBGS: 133-151 (inpatient orders for glycemic control are 0-9 units insulin aspart every 4 hours and 10 units insulin glargine q HS).   NUTRITION - FOCUSED PHYSICAL EXAM:    Most Recent Value  Orbital Region  No depletion  Upper Arm Region  No depletion  Thoracic and Lumbar Region  No depletion  Buccal Region  No depletion  Temple Region  No depletion  Clavicle Bone Region  No depletion  Clavicle and Acromion Bone Region  No depletion  Scapular Bone Region  No depletion  Dorsal Hand  No depletion  Patellar Region  No depletion  Anterior Thigh Region  No  depletion  Posterior Calf Region  No depletion  Edema (RD Assessment)  Mild  Hair  Reviewed  Eyes  Reviewed  Mouth  Reviewed  Skin  Reviewed  Nails  Reviewed       Diet Order:   Diet Order            DIET - DYS 1 Room service appropriate? Yes; Fluid consistency: Thin  Diet effective now              EDUCATION NEEDS:   Not appropriate for education at  this time  Skin:  Skin Assessment: Reviewed RN Assessment  Last BM:  03/19/18  Height:   Ht Readings from Last 1 Encounters:  03/15/18 5\' 4"  (1.626 m)    Weight:   Wt Readings from Last 1 Encounters:  03/23/18 69.5 kg    Ideal Body Weight:  54.5 kg  BMI:  Body mass index is 26.31 kg/m.  Estimated Nutritional Needs:   Kcal:  1700-1900  Protein:  85-100 grams  Fluid:  1.7-1.9 L    Areon Cocuzza A. Jimmye Norman, RD, LDN, [place] Registered Dietitian II Certified Diabetes Care and Education Specialist Pager: [phone] After hours Pager: [phone]

## 2018-03-24 LAB — GLUCOSE, CAPILLARY
Glucose-Capillary: 92 mg/dL (ref 70–99)
Glucose-Capillary: 95 mg/dL (ref 70–99)
Glucose-Capillary: 147 mg/dL — ABNORMAL HIGH (ref 70–99)
Glucose-Capillary: 105 mg/dL — ABNORMAL HIGH (ref 70–99)
Glucose-Capillary: 124 mg/dL — ABNORMAL HIGH (ref 70–99)
Glucose-Capillary: 115 mg/dL — ABNORMAL HIGH (ref 70–99)

## 2018-03-24 LAB — BASIC METABOLIC PANEL
Anion gap: 6 (ref 5–15)
BUN: 123 mg/dL — ABNORMAL HIGH (ref 8–23)
CO2: 30 mmol/L (ref 22–32)
Calcium: 8.2 mg/dL — ABNORMAL LOW (ref 8.9–10.3)
Chloride: 110 mmol/L (ref 98–111)
Creatinine, Ser: 2.43 mg/dL — ABNORMAL HIGH (ref 0.44–1.00)
GFR calc Af Amer: 23 mL/min — ABNORMAL LOW (ref >60)
GFR calc non Af Amer: 20 mL/min — ABNORMAL LOW (ref >60)
Glucose, Bld: 102 mg/dL — ABNORMAL HIGH (ref 70–99)
Potassium: 4.1 mmol/L (ref 3.5–5.1)
Sodium: 146 mmol/L — ABNORMAL HIGH (ref 135–145)

## 2018-03-24 LAB — MAGNESIUM: Magnesium: 2.8 mg/dL — ABNORMAL HIGH (ref 1.7–2.4)

## 2018-03-24 MED ORDER — DEXTROSE-NACL 5-0.45 % IV SOLN: INTRAVENOUS | Status: DC

## 2018-03-24 MED ORDER — CARVEDILOL 6.25 MG PO TABS
6.2500 mg | ORAL_TABLET | Freq: Two times a day (BID) | ORAL | Status: DC
  Administered 2018-03-24 – 2018-03-27 (×6): 6.25 mg via ORAL
  Filled 2018-03-24 (×6): qty 1

## 2018-03-24 MED ORDER — NALOXONE HCL 0.4 MG/ML IJ SOLN
0.4000 mg | Freq: Once | INTRAMUSCULAR | Status: AC
  Administered 2018-03-24: 0.4 mg via INTRAVENOUS
  Filled 2018-03-24: qty 1

## 2018-03-24 MED ORDER — INSULIN ASPART 100 UNIT/ML ~~LOC~~ SOLN
0.0000 [IU] | SUBCUTANEOUS | Status: DC
  Administered 2018-03-24 (×3): 1 [IU] via SUBCUTANEOUS

## 2018-03-24 MED ORDER — HYDRALAZINE HCL 50 MG PO TABS
100.0000 mg | ORAL_TABLET | Freq: Three times a day (TID) | ORAL | Status: DC
  Administered 2018-03-24 – 2018-03-27 (×11): 100 mg via ORAL
  Filled 2018-03-24 (×11): qty 2

## 2018-03-24 NOTE — Progress Notes (Signed)
Rapid response Event:  Called by primary RN for concerns of slurred speech and being very hard to arouse.  On my arrival to patients bedside, RN present.  Patient was arouse able with sternal rub.  She was oriented and moving all extremities.  Andrea Najjar, NP at bedside to evaluate.  No RRT interventions at this time.  Informed RN to call if assistance needed.

## 2018-03-24 NOTE — Clinical Social Work Note (Signed)
Clinical Social Work Assessment  Patient Details  Name: Andrea [name] MRN: [id number] Date of Birth: 02/27/1950  Date of referral:  03/24/18               Reason for consult:  Facility Placement, Discharge Planning                Permission sought to share information with:  Facility Contact Representative, Family Supports Permission granted to share information::  Yes, Verbal Permission Granted  Name::        Agency::  SNF's  Relationship::  Sister at bedside.  Contact Information:     Housing/Transportation Living arrangements for the past 2 months:  Apartment Source of Information:  Patient, Medical Team, Siblings Patient Interpreter Needed:  None Criminal Activity/Legal Involvement Pertinent to Current Situation/Hospitalization:  No - Comment as needed Significant Relationships:  Adult Children, Siblings Lives with:  Self Do you feel safe going back to the place where you live?  Yes Need for family participation in patient care:  Yes (Comment)  Care giving concerns:  PT recommending SNF once medically stable for discharge.   Social Worker assessment / plan:  CSW met with patient. Sister at bedside. CSW introduced role and explained that PT recommendations would be discussed. Patient is agreeable to SNF placement. No facility preference although she and her sister noted that they would probably go with a facility close to her son, Christopher's, home. Provided list of CMS Medicare scores for facilities within 25 miles of her zip code. No further concerns. CSW encouraged patient and her sister to contact CSW as needed. CSW will continue to follow patient and her family for support and facilitate discharge to SNF once medically stable.  Employment status:  Retired Insurance information:  Managed Medicare PT Recommendations:  Skilled Nursing Facility Information / Referral to community resources:  Skilled Nursing Facility  Patient/Family's Response to care:  Patient  agreeable to SNF placement. Patient's family supportive and involved in patient's care. Patient and her sister appreciated social work intervention.  Patient/Family's Understanding of and Emotional Response to Diagnosis, Current Treatment, and Prognosis:  Patient and her sister have a good understanding of the reason for admission and her need for rehab prior to returning home alone. Patient and her sister appear happy with hospital care.  Emotional Assessment Appearance:  Appears stated age Attitude/Demeanor/Rapport:  Engaged, Gracious Affect (typically observed):  Accepting, Appropriate, Calm, Flat, Pleasant Orientation:  Oriented to Self, Oriented to Place, Oriented to  Time, Oriented to Situation Alcohol / Substance use:  Never Used Psych involvement (Current and /or in the community):  No (Comment)  Discharge Needs  Concerns to be addressed:  Care Coordination Readmission within the last 30 days:  Yes Current discharge risk:  Dependent with Mobility, Lives alone Barriers to Discharge:  Continued Medical Work up, Insurance Authorization    C , LCSW 03/24/2018, 1:09 PM  

## 2018-03-24 NOTE — Progress Notes (Signed)
Calorie Count Note  48 hour calorie count ordered.  Diet: dysphagia 1 diet with thin liquids Supplements: Ensure Enlive po BID, each supplement provides 350 kcal and 20 grams of protein; Magic Cup TID with meals, each supplement provides 290 kcals and 9 grams protein; 30 ml Prostat BID, each supplement provides 100 kcals and 15 grams protein; MVI with minerals daily  Pt sleeping soundly at time of visit and no family members present.   Meal completion data limited and calorie count data incomplete. Staff report that pt is more alert and that intake has improved.   Per chart review, plan is for SNF. RD will d/c calorie count and continue with current interventions. Will continue to monitor.   Andrea [name] A. Jimmye Norman, RD, LDN, [place] Registered Dietitian II Certified Diabetes Care and Education Specialist Pager: [phone] After hours Pager: [phone]

## 2018-03-24 NOTE — Progress Notes (Signed)
PROGRESS NOTE    Andrea [name]  TTS:[id number] DOB: October 20, 1950 DOA: 03/15/2018 PCP: Medicine, [facility]     Brief Narrative:  Andrea [name] is a 68 year old with past medical history relevant for coronary artery disease status post PCI x2 in 12/2017, stage IV CKD hypothyroidism, hypertension, hyperlipidemia, chronic diastolic heart failure, seizure disorder, chronic pain who comes in chest pain as well as coughing and wheezing and found to have acute on chronic diastolic heart failure. Her course has been complicated by encephalopathy, requiring restraints. Over the weekend, she has made slow improvements in her mentation.   New events last 24 hours / Subjective: Encephalopathy mostly resolved. Sister is at bedside. I confronted patient to ask if she had any narcotic medications in her purse that she might have taken during this hospitalization; she states it is possible (this had previously happened in a prior hospitalization). I asked her and her sister to look in her purse and that she is not to take any medications without our knowledge. Sister ensured me that they will look and take any medications out of patient's room. Patient without complaints of chest pain today, had some nausea earlier.   Assessment & Plan:   Principal Problem:   Encephalopathy Active Problems:   Essential hypertension   Insulin dependent diabetes mellitus (HCC)   Hypothyroidism   History of seizures   Esophageal reflux   Acute CHF (congestive heart failure) (HCC)   Chest pain   Cardiomyopathy (HCC)   CHF (congestive heart failure) (HCC)   Anemia   GAD (generalized anxiety disorder)   Hyperkalemia   Acute toxic/metabolic encephalopathy -CT head, MRI brain without acute abnormality, did reveal chronic ischemic microangiopathy -Ammonia normal -EEG obtained, revealed sharp waves over left hemisphere -LTM EEG suggestive of moderate to severe encephalopathy of nonspecific  etiology, metabolic causes favored -Appreciate neurology.  Recommend discontinuing/decreasing sedating medications.  Continue Depakote 1 g daily.  Supplement B12.  Correct toxic/metabolic derangements.  Neurology signed off 3/1. -Resolved   Acute on chronic diastolic heart failure -BNP 1379.9 -Daily weight, strict I/Os  -Holding diuretic in setting of worsening Cr today  Chest pain -Initially complaining of chest pain on admission, troponin negative x3 -Continue Plavix, Imdur  -No complaints of chest pain today   AKI on chronic kidney disease stage IV -Avoid nephrotoxins  -Trend BMP   Hypertension -Continue Norvasc, coreg, hydralazine   Hyperlipidemia -Continue atorvastatin   Hypothyroidism -TSH within normal -Continue Synthroid  Type 2 diabetes, well controlled -Continue Lantus, sliding scale insulin  Chronic pain syndrome -Stop morphine. Discussed with patient that she is on very high doses of narcotics. She is willing to taper down her medications   Depression/anxiety -Continue buspirone   Seizure disorder -Continue Depakote  Hypernatremia -Improving, continue PO intake   Poor p.o. intake -Secondary to altered mentation. Consulted SLP and dietitian.  Improving now that her mentation is better   DVT prophylaxis: Subcutaneous heparin Code Status: Full code Family Communication: Sister at bedside  Disposition Plan: SNF placement pending   Consultants:   Neurology  Procedures:   None  Antimicrobials:  Anti-infectives (From admission, onward)   None       Objective: Vitals:   03/23/18 2315 03/24/18 0354 03/24/18 0633 03/24/18 1214  BP: (!) 154/54 (!) 142/56 (!) 141/62 (!) 110/50  Pulse: 73 77 88 74  Resp: 16 (!) 22  18  Temp: 97.6 F (36.4 C) 97.7 F (36.5 C)  98 F (36.7 C)  TempSrc: Oral Oral  Oral  SpO2: 98% 99% 93% 94%  Weight:  71.2 kg    Height:        Intake/Output Summary (Last 24 hours) at 03/24/2018 1255 Last data filed at  03/24/2018 0410 Gross per 24 hour  Intake 240 ml  Output 150 ml  Net 90 ml   Filed Weights   03/22/18 0619 03/23/18 0543 03/24/18 0354  Weight: 69.4 kg 69.5 kg 71.2 kg    Examination: General exam: Appears calm and comfortable  Respiratory system: Clear to auscultation. Respiratory effort normal. Cardiovascular system: S1 & S2 heard, RRR. No JVD, murmurs, rubs, gallops or clicks.  Gastrointestinal system: Abdomen is nondistended, soft and nontender. No organomegaly or masses felt. Normal bowel sounds heard. Central nervous system: Alert and oriented. No focal neurological deficits. Extremities: Symmetric 5 x 5 power. Skin: No rashes, lesions or ulcers Psychiatry: Judgement and insight appear normal. Mood & affect appropriate.    Data Reviewed: I have personally reviewed following labs and imaging studies  CBC: Recent Labs  Lab 03/18/18 0530 03/19/18 0626 03/21/18 0642  WBC 4.9 6.2 7.3  HGB 8.1* 9.0* 9.7*  HCT 26.2* 30.1* 29.8*  MCV 97.8 97.1 90.9  PLT 215 181 263   Basic Metabolic Panel: Recent Labs  Lab 03/18/18 0530  03/20/18 0537 03/21/18 0642 03/22/18 0347 03/23/18 0443 03/24/18 0426  NA 144   < > 147* 151* 151* 146* 146*  K 5.4*   < > 4.4 4.0 3.7 3.4* 4.1  CL 104   < > 108 111 113* 111 110  CO2 30   < > 28 31 29 26 30   GLUCOSE 114*   < > 206* 160* 179* 164* 102*  BUN 95*   < > 104* 106* 111* 117* 123*  CREATININE 2.97*   < > 2.09* 1.80* 1.81* 2.14* 2.43*  CALCIUM 8.5*   < > 8.7* 8.8* 8.5* 7.5* 8.2*  MG 2.5*  --   --   --   --   --  2.8*  PHOS 4.3  --   --   --   --   --   --    < > = values in this interval not displayed.   GFR: Estimated Creatinine Clearance: 22 mL/min (A) (by C-G formula based on SCr of 2.43 mg/dL (H)). Liver Function Tests: Recent Labs  Lab 03/18/18 0530 03/18/18 0817  AST  --  36  ALT  --  17  ALKPHOS  --  35*  BILITOT  --  0.6  PROT  --  7.1  ALBUMIN 2.9* 3.0*   No results for input(s): LIPASE, AMYLASE in the last 168  hours. Recent Labs  Lab 03/18/18 0817  AMMONIA 31   Coagulation Profile: No results for input(s): INR, PROTIME in the last 168 hours. Cardiac Enzymes: No results for input(s): CKTOTAL, CKMB, CKMBINDEX, TROPONINI in the last 168 hours. BNP (last 3 results) No results for input(s): PROBNP in the last 8760 hours. HbA1C: No results for input(s): HGBA1C in the last 72 hours. CBG: Recent Labs  Lab 03/23/18 2049 03/23/18 2318 03/24/18 0349 03/24/18 0749 03/24/18 1156  GLUCAP 116* 122* 92 95 147*   Lipid Profile: No results for input(s): CHOL, HDL, LDLCALC, TRIG, CHOLHDL, LDLDIRECT in the last 72 hours. Thyroid Function Tests: No results for input(s): TSH, T4TOTAL, FREET4, T3FREE, THYROIDAB in the last 72 hours. Anemia Panel: No results for input(s): VITAMINB12, FOLATE, FERRITIN, TIBC, IRON, RETICCTPCT in the last 72 hours. Sepsis Labs: No results for input(s): PROCALCITON, LATICACIDVEN  in the last 168 hours.  No results found for this or any previous visit (from the past 240 hour(s)).     Radiology Studies: No results found.    Scheduled Meds: . amLODipine  10 mg Oral Daily  . atorvastatin  80 mg Oral QHS  . busPIRone  15 mg Oral BID  . carvedilol  6.25 mg Oral BID WC  . cloNIDine  0.2 mg Transdermal Q Sat  . clopidogrel  75 mg Oral Daily  . cyanocobalamin  1,000 mcg Intramuscular Daily  . divalproex  250 mg Oral Q6H  . feeding supplement (ENSURE ENLIVE)  237 mL Oral BID BM  . feeding supplement (PRO-STAT SUGAR FREE 64)  30 mL Oral BID  . heparin  5,000 Units Subcutaneous Q8H  . hydrALAZINE  100 mg Oral Q8H  . insulin aspart  0-9 Units Subcutaneous Q4H  . insulin glargine  10 Units Subcutaneous QHS  . isosorbide mononitrate  30 mg Oral Daily  . levothyroxine  200 mcg Oral Q0600  . multivitamin with minerals  1 tablet Oral Daily   Continuous Infusions: . sodium chloride 250 mL (03/19/18 1735)     LOS: 7 days    Time spent: 25 minutes   Dessa Phi,  DO [facility] [email or web address] 03/24/2018, 12:55 PM

## 2018-03-24 NOTE — Progress Notes (Signed)
In beginning of shift pt was more alert and oriented. Now pt is slurring words, with eyes closed and not making much effort to open eyes or even respond. MD notified. Rapid called. Will continue to monitor pt

## 2018-03-24 NOTE — Progress Notes (Signed)
Patient confused, CM talked to patient son [name] via phone, he stated that he is the [facility]; CM instructed him to bring in paperwork / documentation to be placed on the chart; he is agreeable to short term SNF placement; Aneta Mins [phone]

## 2018-03-24 NOTE — Clinical Social Work Placement (Signed)
   CLINICAL SOCIAL WORK PLACEMENT  NOTE  Date:  03/24/2018  Patient Details  Name: Andrea [name] MRN: [id number] Date of Birth: 1950/08/29  Clinical Social Work is seeking post-discharge placement for this patient at the [place] level of care (*CSW will initial, date and re-position this form in  chart as items are completed):      Patient/family provided with [place] Work Department's list of facilities offering this level of care within the geographic area requested by the patient (or if unable, by the patient's family).      Patient/family informed of their freedom to choose among providers that offer the needed level of care, that participate in Medicare, Medicaid or managed care program needed by the patient, have an available bed and are willing to accept the patient.      Patient/family informed of [place]'s ownership interest in [facility] and [facility], as well as of the fact that they are under no obligation to receive care at these facilities.  PASRR submitted to EDS on 03/24/18     PASRR number received on       Existing PASRR number confirmed on 03/24/18     FL2 transmitted to all facilities in geographic area requested by pt/family on 03/24/18     FL2 transmitted to all facilities within larger geographic area on       Patient informed that his/her managed care company has contracts with or will negotiate with certain facilities, including the following:            Patient/family informed of bed offers received.  Patient chooses bed at       Physician recommends and patient chooses bed at      Patient to be transferred to   on  .  Patient to be transferred to facility by       Patient family notified on   of transfer.  Name of family member notified:        PHYSICIAN Please sign FL2     Additional Comment:    _______________________________________________ Candie Chroman, LCSW 03/24/2018, 1:11  PM

## 2018-03-24 NOTE — Significant Event (Signed)
RN paged because pt was more lethargic and difficult to arouse. Slurred speech. Rapid called. NP to bedside.  S: Pt says she feels fine except for low back pain which is not new. Denies CP, SOB, abd pain. Says she just feels sleepy. When asked about what type of seizures she has, she replies, "it has been so long, I can't remember". She denies hx of sleep apnea. Does not use O2 at home. In review of chart, there was some question as to pt having narcotics in her purse. Family was asked to take them home today. Pt denies taking any meds on her own. RN states that this episode occurred just recently and did not last long. She was alert and oriented earlier in shift. Later, RN found her very lethargic and basically unresponsive, but woke up quickly. No seizure activity noted by staff.  O: Chronically ill appearing female in NAD. Awake, alert, but sluggish. Speech is slow but not slurred. Answers questions appropriately. Oriented x 3.  VS reviewed and are stable. CBG 115. Narcan given without change in mental status. HEENT;PERRL. Extra ocular eye movements are normal. Resp: CTA, good air exchange. Card: RRR. SEM 4/6. Extemities: MOE x 4 spontaneously. Edema noted to bilat hands and feet. Right foot worse than left. Neuro: as above, plus: strength 4+/5 throughout. No pronator drift. Face is symmetrical. Tongue midline. Sensation to light touch intact.  A/P: 1. Brief episode of extreme lethargy in setting of encephalopathy. Multi factorial due to metabolic derangement and AKI. MRI brain/CT head neg for acute during this admission. No focal neuro deficit on exam to suggest acute stroke. ? OSA-denies. Consider ABG if recurs.   2. DM II-insulin dependent. CBG 115.  3. Hypothyroidism-TSH normal. Cont Synthroid. 4. CHF exac-improving.  5. Seizure disorder hx-has been seen by neuro here. EEG neg for epileptic changes, but indicative of encephalopathy. Pt on Depakote. Level normal. Watch for activity.  6.  Polypharmacy-Benzos held as well as pain meds. Narcan did not change her mental status tonight. She is on Buspar for depression/anxiety.  Check CMP again. Will follow.  KJKG, NP [facility]

## 2018-03-24 NOTE — NC FL2 (Signed)
[place] LEVEL OF CARE SCREENING TOOL     IDENTIFICATION  Patient Name: Andrea [name] Birthdate: [birth year] Sex: female Admission Date (Current Location): 03/15/2018  [facility] and [place] Number:  Herbalist and Address:  [facility], [place] [address], [place], [place] [address]      Provider Number: [id number]  Attending Physician Name and Address:  Dessa Phi, DO  Relative Name and Phone Number:  Graylon Gunning [facility]) [phone]    Current Level of Care: Hospital Recommended Level of Care: [place] Prior Approval Number:    Date Approved/Denied:   PASRR Number: [id number]  Discharge Plan: SNF    Current Diagnoses: Patient Active Problem List   Diagnosis Date Noted  . Encephalopathy 03/18/2018  . Hyperkalemia 03/15/2018  . 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 CHF (congestive heart failure) ([place]) 02/23/2018  . Chronic systolic heart failure ([place]) 02/12/2018  . Acute respiratory failure with hypoxia ([place]) 01/20/2018  . Constipation 01/20/2018  . High risk medication use 01/19/2018  . Chest pain, rule out acute myocardial infarction 01/17/2018  . Cardiomyopathy ([place]) 01/08/2018  . Drug allergy, multiple 01/08/2018  . History of breast cancer 01/08/2018  . Ischemic chest pain ([place]) 01/04/2018  . Chest pain 01/04/2018  . CAD S/P percutaneous coronary angioplasty 12/30/2017  . Acute CHF (congestive heart failure) ([place]) 12/26/2017  . Unspecified abdominal pain 06/02/2017  . C. difficile colitis 03/17/2017  . AKI (acute kidney injury) ([place])   . Diverticulitis 02/17/2017  . Frequent falls 01/08/2017  . Rhabdomyolysis 01/08/2017  . Intractable nausea and vomiting 11/23/2016  . Insulin dependent diabetes mellitus ([place])   . Multiple rib fractures 11/28/2013  . Diarrhea 10/05/2013  . Dyslipidemia 09/16/2013  . History of seizures 08/04/2013  .  Chronic pain 07/01/2013  . Anemia 03/16/2013  . Inadequate pain control 03/15/2013  . Cancer of left breast ([place]) 01/20/2013  . Migraine headache 11/10/2012  . 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 Diabetic peripheral neuropathy associated with type 2 diabetes mellitus ([place]) 04/17/2012  . GAD (generalized anxiety disorder) 05/25/2010  . Persistent insomnia 05/25/2010  . Disorder of kidney and ureter 01/26/2009  . Type 2 diabetes mellitus with hyperglycemia, with long-term current use of insulin ([place]) 11/03/2008  . Essential hypertension 09/21/2008  . Esophageal reflux 08/05/2008  . Seizure disorder ([place]) 04/12/2008  . Hypothyroidism 02/27/2007    Orientation RESPIRATION BLADDER Height & Weight     Self, Time, Situation, Place  O2(Nasal Canula 2 L) Incontinent, External catheter Weight: 157 lb (71.2 kg) Height:  5\' 4"  (162.6 cm)  BEHAVIORAL SYMPTOMS/MOOD NEUROLOGICAL BOWEL NUTRITION STATUS  (None) Convulsions/Seizures Continent Diet(DYS 1)  AMBULATORY STATUS COMMUNICATION OF NEEDS Skin   Limited Assist Verbally Skin abrasions, Bruising                       Personal Care Assistance Level of Assistance  Bathing, Feeding, Dressing Bathing Assistance: Limited assistance Feeding assistance: Limited assistance Dressing Assistance: Limited assistance     Functional Limitations Info  Sight, Speech, Hearing Sight Info: Adequate Hearing Info: Adequate Speech Info: Adequate    SPECIAL CARE FACTORS FREQUENCY  PT (By licensed PT), Blood pressure, OT (By licensed OT), Speech therapy     PT Frequency: 5 x week OT Frequency: 5 x week     Speech Therapy Frequency: 5 x week      Contractures  Contractures Info: Not present    Additional Factors Info  Code Status, Allergies Code Status Info: Full code Allergies Info: Ticagrelor, Aspartame And Phenylalanine, Aspirin, Maxzide (Hydrochlorothiazide W-triamterene), Metformin And  Related, Nsaids, Other, Pravachol (Pravastatin), Spironolactone, Stadol (Butorphanol), Toradol (Ketorolac Tromethamine), Vistaril (Hydroxyzine Hcl), Erythromycin, Morphine And Related, Penicillins           Current Medications (03/24/2018):  This is the current hospital active medication list Current Facility-Administered Medications  Medication Dose Route Frequency Provider Last Rate Last Dose  . 0.9 %  sodium chloride infusion   Intravenous PRN Dessa Phi, DO 10 mL/hr at 03/19/18 1735 250 mL at 03/19/18 1735  . acetaminophen (TYLENOL) tablet 650 mg  650 mg Oral Q6H PRN [name], MD   650 mg at 03/15/18 1509   Or  . acetaminophen (TYLENOL) suppository 650 mg  650 mg Rectal Q6H PRN [name], MD      . albuterol (PROVENTIL) (2.5 MG/3ML) 0.083% nebulizer solution 2.5 mg  2.5 mg Nebulization Q2H PRN [name], MD   2.5 mg at 03/23/18 0222  . amLODipine (NORVASC) tablet 10 mg  10 mg Oral Daily Dessa Phi, DO   10 mg at 03/24/18 0934  . atorvastatin (LIPITOR) tablet 80 mg  80 mg Oral QHS [name], MD   80 mg at 03/23/18 2206  . busPIRone (BUSPAR) tablet 15 mg  15 mg Oral BID [name], MD   15 mg at 03/24/18 0934  . carvedilol (COREG) tablet 6.25 mg  6.25 mg Oral BID WC Dessa Phi, DO   6.25 mg at 03/24/18 0941  . cloNIDine (CATAPRES - Dosed in mg/24 hr) patch 0.2 mg  0.2 mg Transdermal Q Sat Blount, Xenia T, NP   0.2 mg at 03/22/18 0101  . clopidogrel (PLAVIX) tablet 75 mg  75 mg Oral Daily [name], MD   75 mg at 03/24/18 0934  . cyanocobalamin ((VITAMIN B-12)) injection 1,000 mcg  1,000 mcg Intramuscular Daily Dessa Phi, DO   1,000 mcg at 03/24/18 1001  . divalproex (DEPAKOTE) DR tablet 250 mg  250 mg Oral Q6H Karren Cobble, RPH   250 mg at 03/24/18 1223  . feeding supplement (ENSURE ENLIVE) (ENSURE ENLIVE) liquid 237 mL  237 mL Oral BID BM Dessa Phi, DO      . feeding supplement (PRO-STAT SUGAR FREE 64) liquid 30 mL  30 mL Oral BID  Dessa Phi, DO   30 mL at 03/24/18 0934  . haloperidol (HALDOL) tablet 5 mg  5 mg Oral Q6H PRN Purohit, Shrey C, MD       Or  . haloperidol lactate (HALDOL) injection 5 mg  5 mg Intramuscular Q6H PRN Purohit, Shrey C, MD   5 mg at 03/21/18 0212  . heparin injection 5,000 Units  5,000 Units Subcutaneous Q8H [name], MD   5,000 Units at 03/24/18 0629  . hydrALAZINE (APRESOLINE) injection 5 mg  5 mg Intravenous Q4H PRN Dessa Phi, DO   5 mg at 03/22/18 0004  . hydrALAZINE (APRESOLINE) tablet 100 mg  100 mg Oral Q8H Dessa Phi, DO   100 mg at 03/24/18 0539  . insulin aspart (novoLOG) injection 0-9 Units  0-9 Units Subcutaneous Q4H Dessa Phi, DO   1 Units at 03/24/18 1229  . insulin glargine (LANTUS) injection 10 Units  10 Units Subcutaneous QHS [name], MD   10 Units at 03/23/18 2208  . isosorbide mononitrate (IMDUR) 24 hr tablet 30 mg  30 mg Oral Daily Ghimire, [place],  MD   30 mg at 03/24/18 0934  . labetalol (NORMODYNE,TRANDATE) injection 5 mg  5 mg Intravenous Q2H PRN Dessa Phi, DO   5 mg at 03/21/18 2125  . levothyroxine (SYNTHROID, LEVOTHROID) tablet 200 mcg  200 mcg Oral Q0600 Karren Cobble, RPH   200 mcg at 03/24/18 9024  . multivitamin with minerals tablet 1 tablet  1 tablet Oral Daily Dessa Phi, DO   1 tablet at 03/24/18 0934  . ondansetron (ZOFRAN) injection 4 mg  4 mg Intravenous Q8H PRN Lovey Newcomer T, NP   4 mg at 03/24/18 1235     Discharge Medications: Please see discharge summary for a list of discharge medications.  Relevant Imaging Results:  Relevant Lab Results:   Additional Information SS#: [id number]  Candie Chroman, LCSW

## 2018-03-24 NOTE — Care Management Important Message (Signed)
Important Message  Patient Details  Name: Andrea [name] MRN: [id number] Date of Birth: 10-Aug-1950   Medicare Important Message Given:  Yes    [name] [name] 03/24/2018, 12:36 PM

## 2018-03-25 DIAGNOSIS — E875 Hyperkalemia: Secondary | ICD-10-CM

## 2018-03-25 DIAGNOSIS — I255 Ischemic cardiomyopathy: Secondary | ICD-10-CM

## 2018-03-25 DIAGNOSIS — E038 Other specified hypothyroidism: Secondary | ICD-10-CM

## 2018-03-25 DIAGNOSIS — Z87898 Personal history of other specified conditions: Secondary | ICD-10-CM

## 2018-03-25 DIAGNOSIS — N184 Chronic kidney disease, stage 4 (severe): Secondary | ICD-10-CM

## 2018-03-25 DIAGNOSIS — I10 Essential (primary) hypertension: Secondary | ICD-10-CM

## 2018-03-25 LAB — BASIC METABOLIC PANEL
Anion gap: 9 (ref 5–15)
BUN: 135 mg/dL — ABNORMAL HIGH (ref 8–23)
CO2: 27 mmol/L (ref 22–32)
Calcium: 7.8 mg/dL — ABNORMAL LOW (ref 8.9–10.3)
Chloride: 104 mmol/L (ref 98–111)
Creatinine, Ser: 2.95 mg/dL — ABNORMAL HIGH (ref 0.44–1.00)
GFR calc Af Amer: 18 mL/min — ABNORMAL LOW (ref >60)
GFR calc non Af Amer: 16 mL/min — ABNORMAL LOW (ref >60)
Glucose, Bld: 107 mg/dL — ABNORMAL HIGH (ref 70–99)
Potassium: 4.4 mmol/L (ref 3.5–5.1)
Sodium: 140 mmol/L (ref 135–145)

## 2018-03-25 LAB — GLUCOSE, CAPILLARY
GLUCOSE-CAPILLARY: 88 mg/dL (ref 70–99)
Glucose-Capillary: 79 mg/dL (ref 70–99)
Glucose-Capillary: 133 mg/dL — ABNORMAL HIGH (ref 70–99)
Glucose-Capillary: 135 mg/dL — ABNORMAL HIGH (ref 70–99)
Glucose-Capillary: 134 mg/dL — ABNORMAL HIGH (ref 70–99)

## 2018-03-25 MED ORDER — INSULIN ASPART 100 UNIT/ML ~~LOC~~ SOLN
0.0000 [IU] | Freq: Three times a day (TID) | SUBCUTANEOUS | Status: DC
  Administered 2018-03-25 – 2018-03-26 (×4): 1 [IU] via SUBCUTANEOUS

## 2018-03-25 MED ORDER — DICLOFENAC SODIUM 1 % TD GEL
4.0000 g | Freq: Four times a day (QID) | TRANSDERMAL | Status: DC
  Administered 2018-03-25 – 2018-03-27 (×6): 4 g via TOPICAL
  Filled 2018-03-25: qty 100

## 2018-03-25 MED ORDER — MORPHINE SULFATE ER 15 MG PO TBCR
15.0000 mg | EXTENDED_RELEASE_TABLET | Freq: Two times a day (BID) | ORAL | Status: DC
  Administered 2018-03-25 – 2018-03-27 (×3): 15 mg via ORAL
  Filled 2018-03-25 (×4): qty 1

## 2018-03-25 NOTE — Plan of Care (Signed)
  Problem: Education: Goal: Ability to demonstrate management of disease process will improve Outcome: Progressing Goal: Ability to verbalize understanding of medication therapies will improve Outcome: Progressing   Problem: Cardiac: Goal: Ability to achieve and maintain adequate cardiopulmonary perfusion will improve Outcome: Progressing   Problem: Health Behavior/Discharge Planning: Goal: Ability to manage health-related needs will improve Outcome: Progressing   Problem: Clinical Measurements: Goal: Ability to maintain clinical measurements within normal limits will improve Outcome: Progressing Goal: Will remain free from infection Outcome: Progressing Goal: Respiratory complications will improve Outcome: Progressing Goal: Cardiovascular complication will be avoided Outcome: Progressing   Problem: Nutrition: Goal: Adequate nutrition will be maintained Outcome: Progressing   Problem: Elimination: Goal: Will not experience complications related to bowel motility Outcome: Progressing Goal: Will not experience complications related to urinary retention Outcome: Progressing   Problem: Pain Managment: Goal: General experience of comfort will improve Outcome: Progressing   Problem: Safety: Goal: Ability to remain free from injury will improve Outcome: Progressing   Problem: Skin Integrity: Goal: Risk for impaired skin integrity will decrease Outcome: Progressing

## 2018-03-25 NOTE — Plan of Care (Signed)
  Problem: Education: Goal: Knowledge of General Education information will improve Description Including pain rating scale, medication(s)/side effects and non-pharmacologic comfort measures Outcome: Progressing   Problem: Education: Goal: Ability to demonstrate management of disease process will improve Outcome: Progressing Goal: Ability to verbalize understanding of medication therapies will improve Outcome: Progressing Goal: Individualized Educational Video(s) Outcome: Progressing   Problem: Activity: Goal: Capacity to carry out activities will improve Outcome: Progressing   Problem: Cardiac: Goal: Ability to achieve and maintain adequate cardiopulmonary perfusion will improve Outcome: Progressing   Problem: Health Behavior/Discharge Planning: Goal: Ability to manage health-related needs will improve Outcome: Progressing   Problem: Clinical Measurements: Goal: Ability to maintain clinical measurements within normal limits will improve Outcome: Progressing Goal: Will remain free from infection Outcome: Progressing Goal: Diagnostic test results will improve Outcome: Progressing Goal: Respiratory complications will improve Outcome: Progressing Goal: Cardiovascular complication will be avoided Outcome: Progressing   Problem: Activity: Goal: Risk for activity intolerance will decrease Outcome: Progressing   Problem: Nutrition: Goal: Adequate nutrition will be maintained Outcome: Progressing   Problem: Coping: Goal: Level of anxiety will decrease Outcome: Progressing   Problem: Elimination: Goal: Will not experience complications related to bowel motility Outcome: Progressing Goal: Will not experience complications related to urinary retention Outcome: Progressing   Problem: Pain Managment: Goal: General experience of comfort will improve Outcome: Progressing   Problem: Safety: Goal: Ability to remain free from injury will improve Outcome: Progressing    Problem: Skin Integrity: Goal: Risk for impaired skin integrity will decrease Outcome: Progressing

## 2018-03-25 NOTE — Progress Notes (Signed)
PROGRESS NOTE    Andrea [name]   QIO:[id number]  DOB: 06/18/1950  DOA: 03/15/2018 PCP: Medicine, [facility]   Brief Narrative:  Andrea [name] is a 68 y.o. female with medical history significant of severe triple-vessel coronary artery disease status post cardiac cath no PCI, poor candidate for bypass surgery, ischemic cardiomyopathy recently improved ejection fraction, multiple hospitalizations, diabetes on insulin, hypothyroidism, hypertension, seizure disorder, chronic pain disorder who presents to the emergency room with worsening shortness of breath and cough as well as chest pain and elephant walking on her chest.  She was initially somnolent and overnight became more combative, agitated and violent.  Further conversation was had with her son who states that the patient has been "overmedicated" and often becomes like a zombie.  The patient required restraints and a neuro eval was requested as well.    2/27-an EEG was ordered and revealed disorganized background activity bilaterally and frequent left temporoparietal sharp transients of triphasic morphology  Subjective: .  States "I never want to get back on morphine again".  She has no complaints.    Assessment & Plan:   Principal Problem:   Encephalopathy-chronic back pain -Thought to be secondary to narcotics and has resolved -Continue MS Contin at half home dose of 15 mg twice daily -Continue Depakote  Active Problems: Acute diastolic heart failure -This was the original reason for admission - This has resolved  Chest pain on admission -3 sets of troponin negative -Continue Plavix and Imdur  Chronic anxiety - Continue BuSpar  B 12 deficiency -Vitamin B-12 level was 283 -Continue IM B12 injections while in the hospital    Essential hypertension -Continue amlodipine, carvedilol & hydralazine    Insulin dependent diabetes mellitus -Continue Lantus and NovoLog sliding scale   Hypothyroidism -Continue Synthroid    History of seizures -Continue Depakote  Arthritis with chronic left knee pain Start Voltaren  Time spent in minutes: 35 DVT prophylaxis: Heparin Code Status: Full code Family Communication: No family at bedside Disposition Plan: Home tomorrow Consultants:   Neurology Procedures:   EEG Antimicrobials:  Anti-infectives (From admission, onward)   None       Objective: Vitals:   03/25/18 0326 03/25/18 0514 03/25/18 1148 03/25/18 1542  BP: (!) 124/44 112/71 (!) 101/58 125/63  Pulse: 74  66   Resp: 18  18   Temp: 97.7 F (36.5 C)  (!) 97.3 F (36.3 C)   TempSrc: Oral  Oral   SpO2: 99%  91%   Weight:      Height:        Intake/Output Summary (Last 24 hours) at 03/25/2018 1544 Last data filed at 03/25/2018 1427 Gross per 24 hour  Intake 1790 ml  Output 200 ml  Net 1590 ml   Filed Weights   03/23/18 0543 03/24/18 0354 03/25/18 0302  Weight: 69.5 kg 71.2 kg 70.4 kg    Examination: General exam: Appears comfortable  HEENT: PERRLA, oral mucosa moist, no sclera icterus or thrush Respiratory system: Clear to auscultation. Respiratory effort normal. Cardiovascular system: S1 & S2 heard, RRR.   Gastrointestinal system: Abdomen soft, non-tender, nondistended. Normal bowel sounds. Central nervous system: Alert and oriented. No focal neurological deficits. Extremities: No cyanosis, clubbing or edema Skin: No rashes or ulcers Psychiatry:  Mood & affect appropriate.   Data Reviewed: I have personally reviewed following labs and imaging studies  CBC: Recent Labs  Lab 03/19/18 0626 03/21/18 0642  WBC 6.2 7.3  HGB 9.0* 9.7*  HCT 30.1* 29.8*  MCV 97.1 90.9  PLT 181 119   Basic Metabolic Panel: Recent Labs  Lab 03/21/18 0642 03/22/18 0347 03/23/18 0443 03/24/18 0426 03/24/18 2300  NA 151* 151* 146* 146* 140  K 4.0 3.7 3.4* 4.1 4.4  CL 111 113* 111 110 104  CO2 31 29 26 30 27   GLUCOSE 160* 179* 164* 102* 107*  BUN 106*  111* 117* 123* 135*  CREATININE 1.80* 1.81* 2.14* 2.43* 2.95*  CALCIUM 8.8* 8.5* 7.5* 8.2* 7.8*  MG  --   --   --  2.8*  --    GFR: Estimated Creatinine Clearance: 18.1 mL/min (A) (by C-G formula based on SCr of 2.95 mg/dL (H)). Liver Function Tests: No results for input(s): AST, ALT, ALKPHOS, BILITOT, PROT, ALBUMIN in the last 168 hours. No results for input(s): LIPASE, AMYLASE in the last 168 hours. No results for input(s): AMMONIA in the last 168 hours. Coagulation Profile: No results for input(s): INR, PROTIME in the last 168 hours. Cardiac Enzymes: No results for input(s): CKTOTAL, CKMB, CKMBINDEX, TROPONINI in the last 168 hours. BNP (last 3 results) No results for input(s): PROBNP in the last 8760 hours. HbA1C: No results for input(s): HGBA1C in the last 72 hours. CBG: Recent Labs  Lab 03/24/18 2109 03/24/18 2215 03/25/18 0003 03/25/18 0350 03/25/18 1150  GLUCAP 124* 115* 88 79 133*   Lipid Profile: No results for input(s): CHOL, HDL, LDLCALC, TRIG, CHOLHDL, LDLDIRECT in the last 72 hours. Thyroid Function Tests: No results for input(s): TSH, T4TOTAL, FREET4, T3FREE, THYROIDAB in the last 72 hours. Anemia Panel: No results for input(s): VITAMINB12, FOLATE, FERRITIN, TIBC, IRON, RETICCTPCT in the last 72 hours. Urine analysis:    Component Value Date/Time   COLORURINE YELLOW 02/25/2018 2316   APPEARANCEUR HAZY (A) 02/25/2018 2316   LABSPEC 1.014 02/25/2018 2316   PHURINE 5.0 02/25/2018 2316   GLUCOSEU NEGATIVE 02/25/2018 2316   HGBUR SMALL (A) 02/25/2018 2316   BILIRUBINUR NEGATIVE 02/25/2018 2316   KETONESUR NEGATIVE 02/25/2018 2316   PROTEINUR >=300 (A) 02/25/2018 2316   UROBILINOGEN 1.0 12/02/2013 1612   NITRITE NEGATIVE 02/25/2018 2316   LEUKOCYTESUR SMALL (A) 02/25/2018 2316   Sepsis Labs: @LABRCNTIP (procalcitonin:4,lacticidven:4) )No results found for this or any previous visit (from the past 240 hour(s)).       Radiology Studies: No results  found.    Scheduled Meds: . amLODipine  10 mg Oral Daily  . atorvastatin  80 mg Oral QHS  . busPIRone  15 mg Oral BID  . carvedilol  6.25 mg Oral BID WC  . clopidogrel  75 mg Oral Daily  . cyanocobalamin  1,000 mcg Intramuscular Daily  . diclofenac sodium  4 g Topical QID  . divalproex  250 mg Oral Q6H  . feeding supplement (ENSURE ENLIVE)  237 mL Oral BID BM  . feeding supplement (PRO-STAT SUGAR FREE 64)  30 mL Oral BID  . heparin  5,000 Units Subcutaneous Q8H  . hydrALAZINE  100 mg Oral Q8H  . insulin aspart  0-9 Units Subcutaneous TID WC  . insulin glargine  10 Units Subcutaneous QHS  . isosorbide mononitrate  30 mg Oral Daily  . levothyroxine  200 mcg Oral Q0600  . morphine  15 mg Oral BID  . multivitamin with minerals  1 tablet Oral Daily   Continuous Infusions: . sodium chloride 250 mL (03/19/18 1735)     LOS: 8 days      Debbe Odea, MD [facility] Pager: [email or web address] Password [facility] 03/25/2018, 3:44 PM

## 2018-03-25 NOTE — Progress Notes (Signed)
Physical Therapy Treatment Patient Details Name: Andrea [name] MRN: [id number] DOB: [birth year] Today's Date: 03/25/2018    History of Present Illness Pt is a 68 y.o. female admitted with chest pain and SOB; CXR concerning for pulmonary edema. Worked up for CHF. Hospital course has been complicated by encephalopathy, requiring restraints. Of note, five recent admissions since 12/2017 with similar symptoms, s/p cardiac cath and PCI. PMH includes chronic pain, DM, seizures, anxiety/depression, BG and thalami lacunar infarcts, fluctuating renal function.    PT Comments    Pt mod indep with bed mobility; declining OOB mobility secondary to fatigue. Increased time spent discussing current condition and therapy recommendations. Pt demonstrates decreased problem solving, decreased memory and poor insight into deficits; she attributes current condition to "I was so stupid, I took too much morphine and other drugs... too much." Pt remains agreeable to SNF-level therapies. SpO2 90% on RA.   Follow Up Recommendations  SNF;Supervision/Assistance - 24 hour     Equipment Recommendations    TBD   Recommendations for Other Services       Precautions / Restrictions Precautions Precautions: Fall Restrictions Weight Bearing Restrictions: No    Mobility  Bed Mobility Overal bed mobility: Needs Assistance Bed Mobility: Rolling Rolling: Modified independent (Device/Increase time)         General bed mobility comments: Pt declining OOB mobility despite max encouragement. Indep with bed-level mobility, able to reposition well. SpO2 90% on RA  Transfers                    Ambulation/Gait                 Stairs             Wheelchair Mobility    Modified Rankin (Stroke Patients Only)       Balance                                            Cognition Arousal/Alertness: Awake/alert Behavior During Therapy: Flat affect Overall Cognitive  Status: Impaired/Different from baseline Area of Impairment: Memory;Following commands;Safety/judgement;Awareness;Problem solving;Attention                   Current Attention Level: Alternating Memory: Decreased short-term memory Following Commands: Follows one step commands with increased time Safety/Judgement: Decreased awareness of safety;Decreased awareness of deficits Awareness: Emergent Problem Solving: Requires verbal cues;Slow processing General Comments: "I did something really stupid, I took too many pills" (referring to reason she was readmitted to hospital; pt denies self-harm)      Exercises      General Comments General comments (skin integrity, edema, etc.): Increased time discussing reasons for readmission; pt with poor insight into deficits, decreased memory even during same conversation (able to recall PT's name with cue for first letter), but able to state she overdosed on pain medication leading to admission. Educ on importance of mobility, especially since pt wanting to d/c to SNF. Noticed pt with SLP "puree" diet orders (yellow sheet above bed), but pt with heart healthy lunch tray that had been delivered; pt stating, "I got to order what I wanted since my blood sugar was low." RN notified and will double check dinner tray is appropriate for current diet status      Pertinent Vitals/Pain Pain Assessment: No/denies pain    Home Living  Prior Function            PT Goals (current goals can now be found in the care plan section) Acute Rehab PT Goals Patient Stated Goal: get better to play with Grandaughter PT Goal Formulation: With patient Time For Goal Achievement: 04/06/18 Potential to Achieve Goals: Good Progress towards PT goals: Not progressing toward goals - comment(Declining mobility)    Frequency    Min 2X/week      PT Plan Current plan remains appropriate    Co-evaluation              AM-PAC PT "6  Clicks" Mobility   Outcome Measure  Help needed turning from your back to your side while in a flat bed without using bedrails?: None Help needed moving from lying on your back to sitting on the side of a flat bed without using bedrails?: A Little Help needed moving to and from a bed to a chair (including a wheelchair)?: A Little Help needed standing up from a chair using your arms (e.g., wheelchair or bedside chair)?: A Little Help needed to walk in hospital room?: A Little Help needed climbing 3-5 steps with a railing? : A Lot 6 Click Score: 18    End of Session   Activity Tolerance: Patient limited by fatigue Patient left: in bed;with call bell/phone within reach;with bed alarm set Nurse Communication: Mobility status PT Visit Diagnosis: Unsteadiness on feet (R26.81);Muscle weakness (generalized) (M62.81)     Time: 8421-0312 PT Time Calculation (min) (ACUTE ONLY): 11 min  Charges:  $Self Care/Home Management: [place], PT, DPT Acute Rehabilitation Services  Pager [phone] Office [place] 03/25/2018, 5:25 PM

## 2018-03-25 NOTE — Clinical Social Work Note (Addendum)
Provided patient with list of bed offers. She has not reviewed scores with her son yet. She did say that she picked out [facility] as first preference and [facility] as second preference. Neither have responded to the referral yet. Left message for [facility] admissions coordinator asking her to review.  Dayton Scrape, [place]  1:04 pm [facility] is currently not taking any new admissions. Left message for [facility] admissions coordinator asking her to review referral.  Dayton Scrape, [place]  3:12 pm [facility] has offered a bed. This is their last rehab bed so if one of their ILF/ALF residents need it, they will get preference. Patient and son are aware and agreeable. Son is aware there is a list of other bed offers in the room in case one of their residents gets the bed. Sent MD a message to see if there is an anticipated discharge date.  Dayton Scrape, [place] [phone]  3:44 pm Asked [facility] admissions coordinator to start insurance authorization.  Dayton Scrape, [place]

## 2018-03-25 NOTE — Progress Notes (Deleted)
HPI: Follow-up coronary artery disease.  Patient admitted December 2019 with congestive heart failure.  Echocardiogram showed ejection fraction 30 to 35% and moderate left atrial enlargement.  Cardiac catheterization showed severe three-vessel coronary artery disease and ejection fraction 30 to 35%.  Right heart pressures normal.  Patient seen by surgery for consideration of coronary artery bypass and graft; however cardiomyopathy felt mixed ischemic/nonischemic and PCI of LAD recommended.  Patient subsequently had PCI of LAD to the proximal LAD and distal LAD.  Patient has an allergy to aspirin and Brilinta was recommended at 90 mg twice daily for 1 year then 60 mg twice daily thereafter.  Follow-up echocardiogram February 2020 showed normal LV function.  Patient has been seen in follow-up and has had multiple complaints.  Her Brilinta was changed to Effient because of dyspnea.  Again admitted February 2020 with chest pain and possible CHF.  Note Entresto discontinued previously because of hyperkalemia.  Patient also with chronic pain syndrome.  Also with significant renal insufficiency.  Since last seen  No current facility-administered medications for this visit.    No current outpatient medications on file.   Facility-Administered Medications Ordered in Other Visits  Medication Dose Route Frequency Provider Last Rate Last Dose  . 0.9 %  sodium chloride infusion   Intravenous PRN Dessa Phi, DO 10 mL/hr at 03/19/18 1735 250 mL at 03/19/18 1735  . acetaminophen (TYLENOL) tablet 650 mg  650 mg Oral Q6H PRN [name], MD   650 mg at 03/24/18 2110   Or  . acetaminophen (TYLENOL) suppository 650 mg  650 mg Rectal Q6H PRN [name], MD      . albuterol (PROVENTIL) (2.5 MG/3ML) 0.083% nebulizer solution 2.5 mg  2.5 mg Nebulization Q2H PRN [name], MD   2.5 mg at 03/23/18 0222  . amLODipine (NORVASC) tablet 10 mg  10 mg Oral Daily Dessa Phi, DO   10 mg at 03/24/18 0934  .  atorvastatin (LIPITOR) tablet 80 mg  80 mg Oral QHS [name], MD   80 mg at 03/24/18 2110  . busPIRone (BUSPAR) tablet 15 mg  15 mg Oral BID [name], MD   15 mg at 03/24/18 2111  . carvedilol (COREG) tablet 6.25 mg  6.25 mg Oral BID WC Dessa Phi, DO   6.25 mg at 03/24/18 1706  . clopidogrel (PLAVIX) tablet 75 mg  75 mg Oral Daily [name], MD   75 mg at 03/24/18 0934  . cyanocobalamin ((VITAMIN B-12)) injection 1,000 mcg  1,000 mcg Intramuscular Daily Dessa Phi, DO   1,000 mcg at 03/24/18 1001  . divalproex (DEPAKOTE) DR tablet 250 mg  250 mg Oral Q6H Karren Cobble, RPH   250 mg at 03/25/18 0514  . feeding supplement (ENSURE ENLIVE) (ENSURE ENLIVE) liquid 237 mL  237 mL Oral BID BM Dessa Phi, DO      . feeding supplement (PRO-STAT SUGAR FREE 64) liquid 30 mL  30 mL Oral BID Dessa Phi, DO   30 mL at 03/24/18 2111  . heparin injection 5,000 Units  5,000 Units Subcutaneous Q8H [name], MD   5,000 Units at 03/25/18 0515  . hydrALAZINE (APRESOLINE) injection 5 mg  5 mg Intravenous Q4H PRN Dessa Phi, DO   5 mg at 03/22/18 0004  . hydrALAZINE (APRESOLINE) tablet 100 mg  100 mg Oral Q8H Dessa Phi, DO   100 mg at 03/25/18 0515  . insulin aspart (novoLOG) injection 0-9 Units  0-9 Units Subcutaneous  Q4H Dessa Phi, DO   1 Units at 03/24/18 2150  . insulin glargine (LANTUS) injection 10 Units  10 Units Subcutaneous QHS [name], MD   10 Units at 03/24/18 2111  . isosorbide mononitrate (IMDUR) 24 hr tablet 30 mg  30 mg Oral Daily [name], MD   30 mg at 03/24/18 0934  . labetalol (NORMODYNE,TRANDATE) injection 5 mg  5 mg Intravenous Q2H PRN Dessa Phi, DO   5 mg at 03/21/18 2125  . levothyroxine (SYNTHROID, LEVOTHROID) tablet 200 mcg  200 mcg Oral Q0600 Karren Cobble, RPH   200 mcg at 03/25/18 6834  . multivitamin with minerals tablet 1 tablet  1 tablet Oral Daily Dessa Phi, DO   1 tablet at 03/24/18 0934  .  ondansetron (ZOFRAN) injection 4 mg  4 mg Intravenous Q8H PRN Lovey Newcomer T, NP   4 mg at 03/25/18 0408     Past Medical History:  Diagnosis Date  . Breast cancer ([place])    bilat mastectomy and LUE lymphadenectomy 2014, chemoRx 2014 - 15  . Diabetes mellitus without complication ([place])   . Esophageal reflux   . Hypertension   . Hypothyroid   . Migraine   . Seizure [facility])     Past Surgical History:  Procedure Laterality Date  . ABDOMINAL HYSTERECTOMY     1984 , done for ruptured cyst and endometriosis  . APPENDECTOMY    . CHOLECYSTECTOMY     1980's  . CORONARY STENT INTERVENTION N/A 12/30/2017   Procedure: CORONARY STENT INTERVENTION;  Surgeon: Martinique, Peter M, MD;  Location: [facility];  Service: Cardiovascular;  Laterality: N/A;  . EYE SURGERY  08/28/2015   Right eye  . KNEE SURGERY Left   . MASTECTOMY     bilateral  . Open surgery for bowel obstruction, 2000's    . RIGHT/LEFT HEART CATH AND CORONARY ANGIOGRAPHY N/A 12/29/2017   Procedure: RIGHT/LEFT HEART CATH AND CORONARY ANGIOGRAPHY;  Surgeon: Martinique, Peter M, MD;  Location: [facility];  Service: Cardiovascular;  Laterality: N/A;    Social History   Socioeconomic History  . Marital status: Divorced    Spouse name: Not on file  . Number of children: Not on file  . Years of education: Not on file  . Highest education level: Not on file  Occupational History  . Not on file  Social Needs  . Financial resource strain: Not on file  . Food insecurity:    Worry: Never true    Inability: Never true  . Transportation needs:    Medical: No    Non-medical: No  Tobacco Use  . Smoking status: Never Smoker  . Smokeless tobacco: Never Used  Substance and Sexual Activity  . Alcohol use: No  . Drug use: No  . Sexual activity: Not Currently  Lifestyle  . Physical activity:    Days per week: Patient refused    Minutes per session: Patient refused  . Stress: Only a little  Relationships  . Social  connections:    Talks on phone: Patient refused    Gets together: Patient refused    Attends religious service: Patient refused    Active member of club or organization: Patient refused    Attends meetings of clubs or organizations: Patient refused    Relationship status: Patient refused  . Intimate partner violence:    Fear of current or ex partner: Patient refused    Emotionally abused: Patient refused    Physically abused: Patient  refused    Forced sexual activity: Patient refused  Other Topics Concern  . Not on file  Social History Narrative   NONE    Family History  Problem Relation Age of Onset  . Sudden Cardiac Death Neg Hx     ROS: no fevers or chills, productive cough, hemoptysis, dysphasia, odynophagia, melena, hematochezia, dysuria, hematuria, rash, seizure activity, orthopnea, PND, pedal edema, claudication. Remaining systems are negative.  Physical Exam: Well-developed well-nourished in no acute distress.  Skin is warm and dry.  HEENT is normal.  Neck is supple.  Chest is clear to auscultation with normal expansion.  Cardiovascular exam is regular rate and rhythm.  Abdominal exam nontender or distended. No masses palpated. Extremities show no edema. neuro grossly intact  ECG- personally reviewed  A/P  1  Kirk Ruths, MD

## 2018-03-26 LAB — BASIC METABOLIC PANEL
ANION GAP: 13 (ref 5–15)
BUN: 140 mg/dL — ABNORMAL HIGH (ref 8–23)
CO2: 27 mmol/L (ref 22–32)
Calcium: 7.9 mg/dL — ABNORMAL LOW (ref 8.9–10.3)
Chloride: 97 mmol/L — ABNORMAL LOW (ref 98–111)
Creatinine, Ser: 3.02 mg/dL — ABNORMAL HIGH (ref 0.44–1.00)
GFR calc Af Amer: 18 mL/min — ABNORMAL LOW (ref >60)
GFR calc non Af Amer: 15 mL/min — ABNORMAL LOW (ref >60)
Glucose, Bld: 130 mg/dL — ABNORMAL HIGH (ref 70–99)
Potassium: 4.6 mmol/L (ref 3.5–5.1)
Sodium: 137 mmol/L (ref 135–145)

## 2018-03-26 LAB — GLUCOSE, CAPILLARY
Glucose-Capillary: 117 mg/dL — ABNORMAL HIGH (ref 70–99)
Glucose-Capillary: 130 mg/dL — ABNORMAL HIGH (ref 70–99)
Glucose-Capillary: 159 mg/dL — ABNORMAL HIGH (ref 70–99)
Glucose-Capillary: 134 mg/dL — ABNORMAL HIGH (ref 70–99)
Glucose-Capillary: 110 mg/dL — ABNORMAL HIGH (ref 70–99)

## 2018-03-26 MED ORDER — SODIUM CHLORIDE 0.9 % IV SOLN
INTRAVENOUS | Status: AC
  Administered 2018-03-26: 19:00:00 via INTRAVENOUS

## 2018-03-26 MED ORDER — FAMOTIDINE 20 MG PO TABS
20.0000 mg | ORAL_TABLET | Freq: Two times a day (BID) | ORAL | Status: DC
  Administered 2018-03-26 – 2018-03-27 (×3): 20 mg via ORAL
  Filled 2018-03-26 (×3): qty 1

## 2018-03-26 NOTE — Progress Notes (Signed)
Occupational Therapy Treatment Patient Details Name: Andrea [name] MRN: [id number] DOB: [birth year] Today's Date: 03/26/2018    History of present illness Pt is a 68 y.o. female admitted with chest pain and SOB; CXR concerning for pulmonary edema. Worked up for CHF. Hospital course has been complicated by encephalopathy, requiring restraints. Of note, five recent admissions since 12/2017 with similar symptoms, s/p cardiac cath and PCI. PMH includes chronic pain, DM, seizures, anxiety/depression, BG and thalami lacunar infarcts, fluctuating renal function.    OT comments  Pt limited this session by n/v. Pt reporting some nausea upon OT arrival, Zofran given about 4 hours ago. Pt asking to get up on [facility] but ultimately limited by onset of emesis once sitting EOB. Nursing notified. Pt returned to supine position, puriwick reconnected. D/c plan remains appropriate.    Follow Up Recommendations  SNF;Supervision/Assistance - 24 hour    Equipment Recommendations  Other (comment)(defer to next venue of care)    Recommendations for Other Services      Precautions / Restrictions Precautions Precautions: Fall Precaution Comments: Watch SpO2 Restrictions Weight Bearing Restrictions: No       Mobility Bed Mobility Overal bed mobility: Needs Assistance Bed Mobility: Supine to Sit;Sit to Supine     Supine to sit: Min assist Sit to supine: Min guard   General bed mobility comments: Decreased initation and delayed reponses. Pt using therapist hand to powerup trunk to EOB.  Transfers                 General transfer comment: unable to assess this session due to nausea with emesis    Balance Overall balance assessment: Needs assistance Sitting-balance support: No upper extremity supported;Feet supported Sitting balance-Leahy Scale: Fair                                     ADL either performed or assessed with clinical judgement   ADL Overall ADL's : Needs  assistance/impaired                                       General ADL Comments: bed mobility only due to onset of emesis.      Vision       Perception     Praxis      Cognition Arousal/Alertness: Awake/alert Behavior During Therapy: Flat affect Overall Cognitive Status: Impaired/Different from baseline Area of Impairment: Memory;Following commands;Safety/judgement;Awareness;Problem solving                   Current Attention Level: Selective Memory: Decreased short-term memory Following Commands: Follows one step commands with increased time Safety/Judgement: Decreased awareness of safety;Decreased awareness of deficits Awareness: Emergent Problem Solving: Decreased initiation;Slow processing;Requires verbal cues          Exercises     Shoulder Instructions       General Comments      Pertinent Vitals/ Pain       Pain Assessment: Faces Faces Pain Scale: Hurts little more Pain Location: Chronic back Pain Descriptors / Indicators: Constant Pain Intervention(s): Monitored during session  Home Living                                          Prior Functioning/Environment  Frequency  Min 2X/week        Progress Toward Goals  OT Goals(current goals can now be found in the care plan section)  Progress towards OT goals: Progressing toward goals(limited by n/v this session)  Acute Rehab OT Goals Patient Stated Goal: get better to play with Grandaughter OT Goal Formulation: With patient Time For Goal Achievement: 04/06/18 Potential to Achieve Goals: Good ADL Goals Pt Will Perform Grooming: with supervision;sitting Pt Will Perform Upper Body Bathing: with supervision;sitting Pt Will Perform Lower Body Bathing: with supervision;sit to/from stand Pt Will Transfer to Toilet: with supervision;ambulating Pt Will Perform Toileting - Clothing Manipulation and hygiene: sit to/from stand Additional ADL Goal  #1: Pt will recall 3 ways of conserving energy with ADL at supervision level  Plan Discharge plan remains appropriate    Co-evaluation                 AM-PAC OT "6 Clicks" Daily Activity     Outcome Measure   Help from another person eating meals?: A Lot Help from another person taking care of personal grooming?: A Lot Help from another person toileting, which includes using toliet, bedpan, or urinal?: A Lot Help from another person bathing (including washing, rinsing, drying)?: A Lot Help from another person to put on and taking off regular upper body clothing?: A Little Help from another person to put on and taking off regular lower body clothing?: A Lot 6 Click Score: 13    End of Session    OT Visit Diagnosis: Unsteadiness on feet (R26.81);Other abnormalities of gait and mobility (R26.89);History of falling (Z91.81);Repeated falls (R29.6);Muscle weakness (generalized) (M62.81);Other symptoms and signs involving cognitive function;Adult, failure to thrive (R62.7)   Activity Tolerance Other (comment)(nausea with emesis after supine>sitting EOB)   Patient Left in bed;with call bell/phone within reach;with bed alarm set   Nurse Communication Other (comment)(nausea with emesis, vitals assessed)        Time: 1049-1110 OT Time Calculation (min): 21 min  Charges: OT General Charges $OT Visit: 1 Visit OT Treatments $Self Care/Home Management : 8-22 mins  Tyrone Schimke, OT Acute Rehabilitation Services Pager: [phone] Office: [phone]    Andrea [name] 03/26/2018, 12:05 PM

## 2018-03-26 NOTE — Progress Notes (Signed)
Pt with nausea and emesis during bed mobility limiting OOB activity. Of note, pt already received Zofran a few hours ago. Vitals assessed at end of session: bp 149/62, HR 80, O2 90-92 on RA. Nursing notified. Full OT note to follow.  Tyrone Schimke, OT Acute Rehabilitation Services Pager: [phone] Office: [phone]

## 2018-03-26 NOTE — Plan of Care (Signed)
  Problem: Education: Goal: Knowledge of General Education information will improve Description Including pain rating scale, medication(s)/side effects and non-pharmacologic comfort measures Outcome: Progressing   Problem: Education: Goal: Ability to demonstrate management of disease process will improve Outcome: Progressing Goal: Ability to verbalize understanding of medication therapies will improve Outcome: Progressing Goal: Individualized Educational Video(s) Outcome: Progressing   Problem: Activity: Goal: Capacity to carry out activities will improve Outcome: Progressing   Problem: Cardiac: Goal: Ability to achieve and maintain adequate cardiopulmonary perfusion will improve Outcome: Progressing   Problem: Health Behavior/Discharge Planning: Goal: Ability to manage health-related needs will improve Outcome: Progressing   Problem: Clinical Measurements: Goal: Ability to maintain clinical measurements within normal limits will improve Outcome: Progressing Goal: Will remain free from infection Outcome: Progressing Goal: Diagnostic test results will improve Outcome: Progressing Goal: Respiratory complications will improve Outcome: Progressing Goal: Cardiovascular complication will be avoided Outcome: Progressing   Problem: Activity: Goal: Risk for activity intolerance will decrease Outcome: Progressing   Problem: Nutrition: Goal: Adequate nutrition will be maintained Outcome: Progressing   Problem: Coping: Goal: Level of anxiety will decrease Outcome: Progressing   Problem: Elimination: Goal: Will not experience complications related to bowel motility Outcome: Progressing Goal: Will not experience complications related to urinary retention Outcome: Progressing   Problem: Pain Managment: Goal: General experience of comfort will improve Outcome: Progressing   Problem: Safety: Goal: Ability to remain free from injury will improve Outcome: Progressing    Problem: Skin Integrity: Goal: Risk for impaired skin integrity will decrease Outcome: Progressing

## 2018-03-26 NOTE — Progress Notes (Signed)
  Speech Language Pathology Treatment: Dysphagia  Patient Details Name: Andrea [name] MRN: [id number] DOB: 05-24-1950 Today's Date: 03/26/2018 Time: 6940-9828 SLP Time Calculation (min) (ACUTE ONLY): 24 min  Assessment / Plan / Recommendation Clinical Impression  Pt nauseated this am- agreeable to oatmeal on tray, thin gingerale via straw and graham cracker (reg texture) as she was upgraded by staff yesterday. Immediate cough following Gingerale suspect due to hot sometimes spiciness of soda's as no subsequent coughing present during session. Compared to prior documentation pt is less confused, more alert and today mastication and transit of regular texture was without abnormalities. Recommend she continue regular, thin liquids- may need encouragement to sit in upright position. No further follow up needed.    HPI HPI: Andrea [name] is a 68 y.o. female with medical history significant of severe triple-vessel coronary artery disease status post cardiac cath no PCI, poor candidate for bypass surgery, ischemic cardiomyopathy recently improved ejection fraction, multiple hospitalizations, diabetes on insulin, hypothyroidism, hypertension, seizure disorder, chronic pain disorder who presents to the emergency room with worsening shortness of breath and cough as well as chest pain.  Patient has been to the hospital multiple times.  She was recently admitted this month with congestive heart failure exacerbation as well as fluctuating renal functions.  She was deemed a poor candidate for bypass and coronary artery disease not amenable to stenting.  Was doing poorly for last few days with more shortness of breath, today morning she woke up at about 6 with sudden onset, constant, persistent, severe left precordial chest pain, felt like elephant walking on her chest, associated with cough and wheezing along with a rattling noise on her breathing.  No nausea or vomiting.  No fever.  No sputum production.   Patient does have orthopnea which is mostly chronic.  Most recent chest xray is showing CHF.  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        SLP Plan  All goals met;Discharge SLP treatment due to (comment)       Recommendations  Diet recommendations: Regular;Thin liquid Liquids provided via: Cup;Straw Medication Administration: Whole meds with puree Supervision: Patient able to self feed;Intermittent supervision to cue for compensatory strategies Compensations: Slow rate;Small sips/bites;Lingual sweep for clearance of pocketing Postural Changes and/or Swallow Maneuvers: Seated upright 90 degrees                Oral Care Recommendations: Oral care BID Follow up Recommendations: None SLP Visit Diagnosis: Dysphagia, unspecified (R13.10) Plan: All goals met;Discharge SLP treatment due to (comment)       GO                Houston Siren 03/26/2018, 9:17 AM   Orbie Pyo Colvin Caroli.Ed Risk analyst [phone] Office [phone]

## 2018-03-26 NOTE — Clinical Social Work Note (Signed)
Insurance authorization approved. MD aware.  Dayton Scrape, [place]

## 2018-03-26 NOTE — Consult Note (Signed)
[facility]  INPATIENT CONSULTATION  Reason for Consultation: AKI Requesting Provider: Dr. Wynelle Cleveland  HPI: Andrea [name] is an 68 y.o. female with PMH of chronic pain, insulin-dependent diabetes, anxiety/depression, HFrEF (12/2017 EF 30-35%, though normal in 02/2018), CAD s/p PCI 12/2017 who is seen for AKI on CKD.    She's had several admission for dyspnea secondary to CHF since 12/2017.  Baseline renal function in the 2-2.4 but experiences AKIs frequently which are thought to be hemodynamically mediated.    She was admitted 2/23 with CP and dyspnea felt secondary to CHF.  She was diuresed initially.  Had encephalopathy ultimately thought secondary to meds/opioids.    I/Os for the admission +4L but she's had a good bit of incontinence.  Qt on admission 161 lbs today 155lbs.  Initial Bps in the 160-170s, trended down to 120s then 101/58 11am 3/4.  Today in the 140/50s.   Cr on admission 2.67 rose to 3.2 then down to 1.8 on 3/1 but has progressively increased since then to 2.95 3/3 , 3.02 today 3/5. She had renal imaging 03/01/18 with CT - L pelvic kidney, no stones, no hydro.   She was anticipated for D/C today but had some emesis and in light of worsening renal function remains hospitalized.   She tells me today she's feeling awful - nausea and emesis since Tuesday, initially unable to keep any po down now can keep down sips of clears.  Other than that no complaints.    PMH: Past Medical History:  Diagnosis Date  . Breast cancer ([place])    bilat mastectomy and LUE lymphadenectomy 2014, chemoRx 2014 - 15  . Diabetes mellitus without complication ([place])   . Esophageal reflux   . Hypertension   . Hypothyroid   . Migraine   . Seizure ([place])    PSH: Past Surgical History:  Procedure Laterality Date  . ABDOMINAL HYSTERECTOMY     1984 , done for ruptured cyst and endometriosis  . APPENDECTOMY    . CHOLECYSTECTOMY     1980's  . CORONARY STENT INTERVENTION N/A 12/30/2017    Procedure: CORONARY STENT INTERVENTION;  Surgeon: Martinique, Peter M, MD;  Location: [facility];  Service: Cardiovascular;  Laterality: N/A;  . EYE SURGERY  08/28/2015   Right eye  . KNEE SURGERY Left   . MASTECTOMY     bilateral  . Open surgery for bowel obstruction, 2000's    . RIGHT/LEFT HEART CATH AND CORONARY ANGIOGRAPHY N/A 12/29/2017   Procedure: RIGHT/LEFT HEART CATH AND CORONARY ANGIOGRAPHY;  Surgeon: Martinique, Peter M, MD;  Location: [facility];  Service: Cardiovascular;  Laterality: N/A;    Past Medical History:  Diagnosis Date  . Breast cancer ([place])    bilat mastectomy and LUE lymphadenectomy 2014, chemoRx 2014 - 15  . Diabetes mellitus without complication ([place])   . Esophageal reflux   . Hypertension   . Hypothyroid   . Migraine   . Seizure ([place])     Medications:  I have reviewed the patient's current medications.  Medications Prior to Admission  Medication Sig Dispense Refill  . amLODipine (NORVASC) 5 MG tablet Take 5 mg by mouth daily.    [name] atorvastatin (LIPITOR) 80 MG tablet Take 1 tablet (80 mg total) by mouth at bedtime. 30 tablet 3  . busPIRone (BUSPAR) 15 MG tablet Take 15 mg by mouth 2 (two) times daily.    . carvedilol (COREG) 6.25 MG tablet Take 1 tablet (6.25 mg total) by mouth  2 (two) times daily with a meal. 60 tablet 3  . clopidogrel (PLAVIX) 75 MG tablet Take 1 tablet (75 mg total) by mouth daily. 30 tablet 3  . divalproex (DEPAKOTE) 250 MG DR tablet Take 1,000 mg by mouth at bedtime.     . furosemide (LASIX) 20 MG tablet Take 1 tablet (20 mg total) by mouth daily. 90 tablet 1  . hydrALAZINE (APRESOLINE) 100 MG tablet Take 1 tablet (100 mg total) by mouth every 8 (eight) hours. 90 tablet 0  . insulin glargine (LANTUS) 100 UNIT/ML injection Inject 35-50 Units into the skin every morning. Per sliding scale.    . isosorbide mononitrate (IMDUR) 30 MG 24 hr tablet Take 1 tablet (30 mg total) by mouth daily. 30 tablet 0  . levothyroxine  (SYNTHROID, LEVOTHROID) 200 MCG tablet Take 1 tablet (200 mcg total) by mouth at bedtime. (Patient taking differently: Take 200 mcg by mouth daily before breakfast. ) 30 tablet 0  . morphine (KADIAN) 30 MG 24 hr capsule Take 30 mg by mouth 2 (two) times daily.    [name] oxyCODONE-acetaminophen (PERCOCET) 10-325 MG tablet Take 1 tablet by mouth 3 (three) times daily as needed for pain.  0  . polyethylene glycol (MIRALAX / GLYCOLAX) packet Take 17 g by mouth daily. (Patient taking differently: Take 17 g by mouth daily as needed for mild constipation. ) 14 each 0  . pregabalin (LYRICA) 75 MG capsule Take 1 capsule (75 mg total) 2 (two) times daily by mouth. 60 capsule 0  . promethazine (PHENERGAN) 25 MG tablet Take 25 mg by mouth every 6 (six) hours as needed for nausea.   1  . senna-docusate (SENOKOT-S) 8.6-50 MG tablet Take 2 tablets by mouth 2 (two) times daily.    . Vitamin D, Ergocalciferol, (DRISDOL) 1.25 MG (50000 UT) CAPS capsule Take 50,000 Units by mouth every Tuesday.       ALLERGIES:   Allergies  Allergen Reactions  . Ticagrelor Rash  . Aspartame And Phenylalanine Hives and Diarrhea  . Aspirin Nausea And Vomiting  . Maxzide [Hydrochlorothiazide W-Triamterene] Swelling    Fluid retention  . Metformin And Related Diarrhea  . Nsaids Nausea And Vomiting  . Other Other (See Comments)    All steroids produce psychosis per pt Artificial sweeteners produce nausea and upset stomach.  Gregary Cromer [Pravastatin] Other (See Comments)    unknown  . Spironolactone   . Stadol [Butorphanol] Other (See Comments)    Toradol, etc And related- hallucinations  . Toradol [Ketorolac Tromethamine] Other (See Comments)    hallucinations  . Vistaril [Hydroxyzine Hcl] Other (See Comments)    unknown  . Erythromycin Itching and Rash  . Morphine And Related Hives and Rash    Iv morphine.  . Penicillins Hives, Itching and Rash    Has patient had a PCN reaction causing immediate rash, facial/tongue/throat  swelling, SOB or lightheadedness with hypotension: yes Has patient had a PCN reaction causing severe rash involving mucus membranes or skin necrosis: unknown Has patient had a PCN reaction that required hospitalization : unknown Has patient had a PCN reaction occurring within the last 10 years: no If all of the above answers are "NO", then may proceed with Cephalosporin use.     FAM HX: Family History  Problem Relation Age of Onset  . Sudden Cardiac Death Neg Hx     Social History:   reports that she has never smoked. She has never used smokeless tobacco. She reports that she does not drink  alcohol or use drugs.  ROS: 12 system ROS per HPI above  Blood pressure (!) 143/57, pulse 78, temperature 98.2 F (36.8 C), temperature source Oral, resp. rate 18, height 5\' 4"  (1.626 m), weight 70.4 kg, SpO2 92 %. PHYSICAL EXAM: Gen: Chronically ill appearing lying in bed, tired  Eyes: anicteric  ENT: MM tacky Neck: no JVD at 45 degrees CV:  RRR, no rub Abd:  Soft, mildly TTP, induces nausea Back: no CVA TTP GU: no foley Extr:  No edema in legs, trace hand edema Neuro: speech is slow consistent with prior, oriented and conversant Skin: ecchymoses from phlebotomy   Results for orders placed or performed during the hospital encounter of 03/15/18 (from the past 48 hour(s))  Glucose, capillary     Status: Abnormal   Collection Time: 03/24/18  4:26 PM  Result Value Ref Range   Glucose-Capillary 105 (H) 70 - 99 mg/dL  Glucose, capillary     Status: Abnormal   Collection Time: 03/24/18  9:09 PM  Result Value Ref Range   Glucose-Capillary 124 (H) 70 - 99 mg/dL  Glucose, capillary     Status: Abnormal   Collection Time: 03/24/18 10:15 PM  Result Value Ref Range   Glucose-Capillary 115 (H) 70 - 99 mg/dL  Basic metabolic panel     Status: Abnormal   Collection Time: 03/24/18 11:00 PM  Result Value Ref Range   Sodium 140 135 - 145 mmol/L   Potassium 4.4 3.5 - 5.1 mmol/L   Chloride 104 98 -  111 mmol/L   CO2 27 22 - 32 mmol/L   Glucose, Bld 107 (H) 70 - 99 mg/dL   BUN 135 (H) 8 - 23 mg/dL   Creatinine, Ser 2.95 (H) 0.44 - 1.00 mg/dL   Calcium 7.8 (L) 8.9 - 10.3 mg/dL   GFR calc non Af Amer 16 (L) >60 mL/min   GFR calc Af Amer 18 (L) >60 mL/min   Anion gap 9 5 - 15    Comment: Performed at [facility], [address] [place], [place] [address]  Glucose, capillary     Status: None   Collection Time: 03/25/18 12:03 AM  Result Value Ref Range   Glucose-Capillary 88 70 - 99 mg/dL  Glucose, capillary     Status: None   Collection Time: 03/25/18  3:50 AM  Result Value Ref Range   Glucose-Capillary 79 70 - 99 mg/dL  Glucose, capillary     Status: Abnormal   Collection Time: 03/25/18 11:50 AM  Result Value Ref Range   Glucose-Capillary 133 (H) 70 - 99 mg/dL  Glucose, capillary     Status: Abnormal   Collection Time: 03/25/18  4:54 PM  Result Value Ref Range   Glucose-Capillary 135 (H) 70 - 99 mg/dL  Glucose, capillary     Status: Abnormal   Collection Time: 03/25/18 10:17 PM  Result Value Ref Range   Glucose-Capillary 134 (H) 70 - 99 mg/dL  Glucose, capillary     Status: Abnormal   Collection Time: 03/26/18  4:03 AM  Result Value Ref Range   Glucose-Capillary 117 (H) 70 - 99 mg/dL  Basic metabolic panel     Status: Abnormal   Collection Time: 03/26/18  6:41 AM  Result Value Ref Range   Sodium 137 135 - 145 mmol/L   Potassium 4.6 3.5 - 5.1 mmol/L   Chloride 97 (L) 98 - 111 mmol/L   CO2 27 22 - 32 mmol/L   Glucose, Bld 130 (H) 70 -  99 mg/dL   BUN 140 (H) 8 - 23 mg/dL   Creatinine, Ser 3.02 (H) 0.44 - 1.00 mg/dL   Calcium 7.9 (L) 8.9 - 10.3 mg/dL   GFR calc non Af Amer 15 (L) >60 mL/min   GFR calc Af Amer 18 (L) >60 mL/min   Anion gap 13 5 - 15    Comment: Performed at [facility] [address], [place], [place] [address]  Glucose, capillary     Status: Abnormal   Collection Time: 03/26/18 11:31 AM  Result Value Ref Range   Glucose-Capillary 159  (H) 70 - 99 mg/dL   Comment 1 Notify RN    Comment 2 Document in Chart   Glucose, capillary     Status: Abnormal   Collection Time: 03/26/18 12:05 PM  Result Value Ref Range   Glucose-Capillary 130 (H) 70 - 99 mg/dL    No results found.  Assessment/Plan **AKI on CKD:  Normal renal function until 12/2017 and since then recently 2-2.4 on background of repeated admissions for CHF exacerbations.  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    In the past few days renal function worsening which coincides with lower BP and poor po intake in setting in unable to tol po intake.  As such will gently hydrate with NS 50/hr x 10 hrs.  She certainly is tenuous with recent h/o CHF but I think this is gently enough that she won't run into significant volume issues.  Continue to hold RAAS inhibitors with mod CKD and repeated AKIs.   **Proteinuria:  She has proteinuria but a serologic eval has been unrevealing including:  HIV, RPR, complements normal, HB and HCV serologies neg.  SPEP nl. SFLC c/w CKD. UPEP neg.  AntiPLA2R eng.   No biopsy is needed  **Nausea/vomiting: no h/o gastroparesis and improving. ? Viral etiology. Supportive care.   **CAD: Plavix s/p PCI.    **Anemia:  Normocytic. Has been in the 9s.   **HTN: on coreg 6.25 BID, hydralzine 100 TID, ISMN 30 daily.  Avoid hypotension.    Justin Mend 03/26/2018, 4:24 PM

## 2018-03-26 NOTE — Progress Notes (Signed)
PROGRESS NOTE    Andrea [name]   VPX:[id number]  DOB: February 17, 1950  DOA: 03/15/2018 PCP: Medicine, [facility]   Brief Narrative:  Andrea [name] is a 68 y.o. female with medical history significant of severe triple-vessel coronary artery disease status post cardiac cath no PCI, poor candidate for bypass surgery, ischemic cardiomyopathy recently improved ejection fraction, multiple hospitalizations, diabetes on insulin, hypothyroidism, hypertension, seizure disorder, chronic pain disorder who presents to the emergency room with worsening shortness of breath and cough as well as chest pain and elephant walking on her chest.  She was initially somnolent and overnight became more combative, agitated and violent.  Further conversation was had with her son who states that the patient has been "overmedicated" and often becomes like a zombie.  The patient required restraints and a neuro eval was requested as well.    2/27-an EEG was ordered and revealed disorganized background activity bilaterally and frequent left temporoparietal sharp transients of triphasic morphology  Subjective:  No complaints this AM- OT note in computer from 11:31 states she was vomiting.     Assessment & Plan:   Principal Problem:   Encephalopathy-chronic back pain -Thought to be secondary to narcotics and has resolved -Continue MS Contin at half home dose of 15 mg twice daily and wean slowly to off -started Voltaren gel for her back pain  Active Problems: Acute diastolic heart failure -This was the original reason for admission - This has resolved  Chest pain on admission -3 sets of troponin negative -Continue Plavix and Imdur  Vomiting - will order Pepcid BID  AKI - Cr slowly rising from 1.81 on 3/1 to 3.02 today- I and O inaccurate as she is having incontinence episodes in the bed- due to [facility], I have not ordered IVF -  I have asked for a renal consult  B 12 deficiency -Vitamin  B-12 level was 283 -Continue IM B12 injections while in the hospital    Essential hypertension -Continue amlodipine, carvedilol & hydralazine    Insulin dependent diabetes mellitus -Continue Lantus and NovoLog sliding scale  Chronic anxiety - Continue BuSpar    Hypothyroidism -Continue Synthroid    History of seizures -Continue Depakote  Arthritis with chronic left knee pain Start Voltaren  Time spent in minutes: 35 DVT prophylaxis: Heparin Code Status: Full code Family Communication: No family at bedside Disposition Plan:  SNF Consultants:   Neurology Procedures:   EEG Antimicrobials:  Anti-infectives (From admission, onward)   None       Objective: Vitals:   03/26/18 0418 03/26/18 0546 03/26/18 1100 03/26/18 1127  BP: (!) 155/54 (!) 148/56 (!) 149/62 (!) 143/57  Pulse: 78  80 78  Resp:    18  Temp: 99.3 F (37.4 C)   98.2 F (36.8 C)  TempSrc: Oral   Oral  SpO2: 97%  92% 92%  Weight: 70.4 kg     Height:        Intake/Output Summary (Last 24 hours) at 03/26/2018 1307 Last data filed at 03/26/2018 0432 Gross per 24 hour  Intake 960 ml  Output -  Net 960 ml   Filed Weights   03/24/18 0354 03/25/18 0302 03/26/18 0418  Weight: 71.2 kg 70.4 kg 70.4 kg    Examination: General exam: Appears comfortable  HEENT: PERRLA, oral mucosa moist, no sclera icterus or thrush Respiratory system: Clear to auscultation. Respiratory effort normal. Cardiovascular system: S1 & S2 heard,  No murmurs  Gastrointestinal system: Abdomen soft, non-tender, nondistended. Normal bowel sounds  Central nervous system: Alert and oriented. No focal neurological deficits. Extremities: No cyanosis, clubbing or edema Skin: No rashes or ulcers Psychiatry:  Mood & affect appropriate.   Data Reviewed: I have personally reviewed following labs and imaging studies  CBC: Recent Labs  Lab 03/21/18 0642  WBC 7.3  HGB 9.7*  HCT 29.8*  MCV 90.9  PLT 762   Basic Metabolic  Panel: Recent Labs  Lab 03/22/18 0347 03/23/18 0443 03/24/18 0426 03/24/18 2300 03/26/18 0641  NA 151* 146* 146* 140 137  K 3.7 3.4* 4.1 4.4 4.6  CL 113* 111 110 104 97*  CO2 29 26 30 27 27   GLUCOSE 179* 164* 102* 107* 130*  BUN 111* 117* 123* 135* 140*  CREATININE 1.81* 2.14* 2.43* 2.95* 3.02*  CALCIUM 8.5* 7.5* 8.2* 7.8* 7.9*  MG  --   --  2.8*  --   --    GFR: Estimated Creatinine Clearance: 17.6 mL/min (A) (by C-G formula based on SCr of 3.02 mg/dL (H)). Liver Function Tests: No results for input(s): AST, ALT, ALKPHOS, BILITOT, PROT, ALBUMIN in the last 168 hours. No results for input(s): LIPASE, AMYLASE in the last 168 hours. No results for input(s): AMMONIA in the last 168 hours. Coagulation Profile: No results for input(s): INR, PROTIME in the last 168 hours. Cardiac Enzymes: No results for input(s): CKTOTAL, CKMB, CKMBINDEX, TROPONINI in the last 168 hours. BNP (last 3 results) No results for input(s): PROBNP in the last 8760 hours. HbA1C: No results for input(s): HGBA1C in the last 72 hours. CBG: Recent Labs  Lab 03/25/18 1150 03/25/18 1654 03/25/18 2217 03/26/18 0403 03/26/18 1205  GLUCAP 133* 135* 134* 117* 130*   Lipid Profile: No results for input(s): CHOL, HDL, LDLCALC, TRIG, CHOLHDL, LDLDIRECT in the last 72 hours. Thyroid Function Tests: No results for input(s): TSH, T4TOTAL, FREET4, T3FREE, THYROIDAB in the last 72 hours. Anemia Panel: No results for input(s): VITAMINB12, FOLATE, FERRITIN, TIBC, IRON, RETICCTPCT in the last 72 hours. Urine analysis:    Component Value Date/Time   COLORURINE YELLOW 02/25/2018 2316   APPEARANCEUR HAZY (A) 02/25/2018 2316   LABSPEC 1.014 02/25/2018 2316   PHURINE 5.0 02/25/2018 2316   GLUCOSEU NEGATIVE 02/25/2018 2316   HGBUR SMALL (A) 02/25/2018 2316   BILIRUBINUR NEGATIVE 02/25/2018 2316   KETONESUR NEGATIVE 02/25/2018 2316   PROTEINUR >=300 (A) 02/25/2018 2316   UROBILINOGEN 1.0 12/02/2013 1612   NITRITE  NEGATIVE 02/25/2018 2316   LEUKOCYTESUR SMALL (A) 02/25/2018 2316   Sepsis Labs: @LABRCNTIP (procalcitonin:4,lacticidven:4) )No results found for this or any previous visit (from the past 240 hour(s)).       Radiology Studies: No results found.    Scheduled Meds: . amLODipine  10 mg Oral Daily  . atorvastatin  80 mg Oral QHS  . busPIRone  15 mg Oral BID  . carvedilol  6.25 mg Oral BID WC  . clopidogrel  75 mg Oral Daily  . diclofenac sodium  4 g Topical QID  . divalproex  250 mg Oral Q6H  . feeding supplement (ENSURE ENLIVE)  237 mL Oral BID BM  . feeding supplement (PRO-STAT SUGAR FREE 64)  30 mL Oral BID  . heparin  5,000 Units Subcutaneous Q8H  . hydrALAZINE  100 mg Oral Q8H  . insulin aspart  0-9 Units Subcutaneous TID WC  . insulin glargine  10 Units Subcutaneous QHS  . isosorbide mononitrate  30 mg Oral Daily  . levothyroxine  200 mcg Oral Q0600  . morphine  15 mg  Oral BID  . multivitamin with minerals  1 tablet Oral Daily   Continuous Infusions: . sodium chloride 250 mL (03/19/18 1735)     LOS: 9 days      Debbe Odea, MD [facility] Pager: [email or web address] Password [email or web address] 03/26/2018, 1:07 PM

## 2018-03-26 NOTE — Progress Notes (Signed)
Nutrition Follow-up  DOCUMENTATION CODES:   Not applicable  INTERVENTION:   -D/c Ensure Enlive po BID, each supplement provides 350 kcal and 20 grams of protein -Continue 30 ml Prostat BID, each supplement provides 100 kcals and 15 grams protein -Continue MVI with minerals daily -D/c Magic Cup TID with meals, each supplement 290 kcals and 9 grams protein  NUTRITION DIAGNOSIS:   Inadequate oral intake related to lethargy/confusion as evidenced by meal completion < 25%.  Ongoing  GOAL:   Patient will meet greater than or equal to 90% of their needs  Progressing  MONITOR:   PO intake, Supplement acceptance, Diet advancement, Labs, Weight trends, Skin, I & O's  REASON FOR ASSESSMENT:   Consult Poor PO, Assessment of nutrition requirement/status  ASSESSMENT:   Andrea [name] is a 68 y.o. female with medical history significant of severe triple-vessel coronary artery disease status post cardiac cath no PCI, poor candidate for bypass surgery, ischemic cardiomyopathy recently improved ejection fraction, multiple hospitalizations, diabetes on insulin, hypothyroidism, hypertension, seizure disorder, chronic pain disorder who presents to the emergency room with worsening shortness of breath and cough as well as chest pain.  Patient has been to the hospital multiple times.  He was recently admitted this month with congestive heart failure exacerbation as well as fluctuating renal functions.  She was deemed a poor candidate for bypass and coronary artery disease not amenable to stenting.  Was doing poorly for last few days with more shortness of breath, today morning she woke up at about 6 with sudden onset, constant, persistent, severe left precordial chest pain, felt like elephant walking on her chest, associated with cough and wheezing along with a rattling noise on her breathing.  No nausea or vomiting.  No fever.  No sputum production.  Patient does have orthopnea which is mostly  chronic  2/25- rapid response due to difficulty to arouse 2/26- rapid response due to agitation 3/1- s/p BSE- dysphagia 1 diet with thin liquids 3/5- s/p BSE- advanced to regular diet with thin liquids   Case discussed with SLP, who reports pt is much more alert today and was advanced to regular consistency diet.   Spoke with pt at bedside, who was more interactive today than from prior visit. Pt reports that she disliked the pureed diet ("it tastes like dog good"). She reports her appetite is improving and knows she will eat more now that she is allowed solid foods. Noted meal completion has increased to 15-80%.   Pt refusing Ensure supplements and Magic Cups, stating "I don't like those; I want to get my nutrition from real food". Discussed importance of good nutritional intake to promote healing. Per pt, she will be discharging to SNF in a few days.   Labs reviewed: CBGS: 117-159 (inpatient orders for glycemic control are 0-9 units insulin aspart TID with meals and 10 units insulin glargine q HS).   Diet Order:   Diet Order            Diet regular Room service appropriate? Yes; Fluid consistency: Thin  Diet effective now              EDUCATION NEEDS:   Not appropriate for education at this time  Skin:  Skin Assessment: Reviewed RN Assessment  Last BM:  03/24/18  Height:   Ht Readings from Last 1 Encounters:  03/15/18 5\' 4"  (1.626 m)    Weight:   Wt Readings from Last 1 Encounters:  03/26/18 70.4 kg    Ideal Body  Weight:  54.5 kg  BMI:  Body mass index is 26.66 kg/m.  Estimated Nutritional Needs:   Kcal:  1700-1900  Protein:  85-100 grams  Fluid:  1.7-1.9 L    [name], RD, LDN, [place] Registered Dietitian II Certified Diabetes Care and Education Specialist Pager: [phone] After hours Pager: [phone]

## 2018-03-27 DIAGNOSIS — N179 Acute kidney failure, unspecified: Secondary | ICD-10-CM

## 2018-03-27 DIAGNOSIS — Z794 Long term (current) use of insulin: Secondary | ICD-10-CM

## 2018-03-27 DIAGNOSIS — G8929 Other chronic pain: Secondary | ICD-10-CM

## 2018-03-27 DIAGNOSIS — E1165 Type 2 diabetes mellitus with hyperglycemia: Secondary | ICD-10-CM

## 2018-03-27 DIAGNOSIS — I5031 Acute diastolic (congestive) heart failure: Secondary | ICD-10-CM

## 2018-03-27 LAB — BASIC METABOLIC PANEL
Anion gap: 9 (ref 5–15)
BUN: 138 mg/dL — AB (ref 8–23)
CO2: 27 mmol/L (ref 22–32)
Calcium: 7.9 mg/dL — ABNORMAL LOW (ref 8.9–10.3)
Chloride: 102 mmol/L (ref 98–111)
Creatinine, Ser: 2.8 mg/dL — ABNORMAL HIGH (ref 0.44–1.00)
GFR calc Af Amer: 20 mL/min — ABNORMAL LOW (ref >60)
GFR calc non Af Amer: 17 mL/min — ABNORMAL LOW (ref >60)
Glucose, Bld: 102 mg/dL — ABNORMAL HIGH (ref 70–99)
Potassium: 4.6 mmol/L (ref 3.5–5.1)
Sodium: 138 mmol/L (ref 135–145)

## 2018-03-27 LAB — GLUCOSE, CAPILLARY
Glucose-Capillary: 98 mg/dL (ref 70–99)
Glucose-Capillary: 113 mg/dL — ABNORMAL HIGH (ref 70–99)

## 2018-03-27 MED ORDER — INSULIN GLARGINE 100 UNIT/ML ~~LOC~~ SOLN: 10.0000 [IU] | Freq: Every day | SUBCUTANEOUS | 11 refills | Status: DC

## 2018-03-27 MED ORDER — MORPHINE SULFATE ER 15 MG PO TBCR: 15.0000 mg | EXTENDED_RELEASE_TABLET | Freq: Two times a day (BID) | ORAL | 0 refills | Status: DC

## 2018-03-27 MED ORDER — FAMOTIDINE 20 MG PO TABS: 20.0000 mg | ORAL_TABLET | Freq: Every day | ORAL | Status: DC

## 2018-03-27 MED ORDER — POLYETHYLENE GLYCOL 3350 17 G PO PACK: 17.0000 g | PACK | Freq: Every day | ORAL | Status: DC | PRN

## 2018-03-27 MED ORDER — DICLOFENAC SODIUM 1 % TD GEL: 4.0000 g | Freq: Four times a day (QID) | TRANSDERMAL | Status: DC

## 2018-03-27 MED ORDER — PRO-STAT SUGAR FREE PO LIQD: 30.0000 mL | Freq: Two times a day (BID) | ORAL | 0 refills | Status: DC

## 2018-03-27 MED ORDER — FAMOTIDINE 20 MG PO TABS: 20.0000 mg | ORAL_TABLET | Freq: Two times a day (BID) | ORAL | Status: DC

## 2018-03-27 MED ORDER — ACETAMINOPHEN 325 MG PO TABS: 650.0000 mg | ORAL_TABLET | Freq: Four times a day (QID) | ORAL | Status: DC | PRN

## 2018-03-27 MED ORDER — ADULT MULTIVITAMIN W/MINERALS CH: 1.0000 | ORAL_TABLET | Freq: Every day | ORAL | Status: AC

## 2018-03-27 NOTE — Progress Notes (Signed)
Report attempted x1 to facility. Left message.   Fritz Pickerel, RN

## 2018-03-27 NOTE — Clinical Social Work Placement (Signed)
   CLINICAL SOCIAL WORK PLACEMENT  NOTE  Date:  03/27/2018  Patient Details  Name: Andrea [name] MRN: [id number] Date of Birth: [birth year]  Clinical Social Work is seeking post-discharge placement for this patient at the [place] level of care (*CSW will initial, date and re-position this form in  chart as items are completed):      Patient/family provided with [place] Work Department's list of facilities offering this level of care within the geographic area requested by the patient (or if unable, by the patient's family).      Patient/family informed of their freedom to choose among providers that offer the needed level of care, that participate in Medicare, Medicaid or managed care program needed by the patient, have an available bed and are willing to accept the patient.      Patient/family informed of [facility]'s ownership interest in [facility] and [facility], as well as of the fact that they are under no obligation to receive care at these facilities.  PASRR submitted to EDS on 03/24/18     PASRR number received on       Existing PASRR number confirmed on 03/24/18     FL2 transmitted to all facilities in geographic area requested by pt/family on 03/24/18     FL2 transmitted to all facilities within larger geographic area on       Patient informed that his/her managed care company has contracts with or will negotiate with certain facilities, including the following:        Yes   Patient/family informed of bed offers received.  Patient chooses bed at [facility]     Physician recommends and patient chooses bed at      Patient to be transferred to [facility] on 03/27/18.  Patient to be transferred to facility by [facility]     Patient family notified on 03/27/18 of transfer.  Name of family member notified:  Left voicemail for son [name]     PHYSICIAN       Additional Comment:     _______________________________________________ Candie Chroman, LCSW 03/27/2018, 10:29 AM

## 2018-03-27 NOTE — Progress Notes (Signed)
Discussed with the patient and all questioned fully answered. She will call me if any problems arise.  IV removed. Discharge packet given to [facility]. Pt discharged with her purse including cell phone, charger, glasses, and jewelry.   Fritz Pickerel, RN

## 2018-03-27 NOTE — Clinical Social Work Note (Signed)
CSW facilitated patient discharge including contacting patient family (Left voicemail for son [name]) and facility to confirm patient discharge plans. Clinical information faxed to facility and family agreeable with plan. CSW arranged ambulance transport via [facility] to [facility] at 1:30 pm. RN to call report prior to discharge ([phone]).  CSW will sign off for now as social work intervention is no longer needed. Please consult [place] again if new needs arise.  [place] Scrape, [place]

## 2018-03-27 NOTE — Discharge Summary (Signed)
Physician Discharge Summary  [name] YHC:[id number] DOB: 07/21/1950 DOA: 03/15/2018  PCP: [facility], [facility]  Admit date: 03/15/2018 Discharge date: 03/27/2018  Admitted From: home  Disposition:  SNF   Recommendations for Outpatient Follow-up:  1. Hold Lasix for 2 more day and encourage oral hydration 2. Resume Lasix on Sunday at 20 mg daily 3. Daily weights 4. Wean off of Morphine if able  Discharge Condition:  stable   CODE STATUS:  Full code   Diet recommendation:  Heart healthy, diabetic diet Consultations:  Cardiology  Nephrology     Discharge Diagnoses:  Principal Problem:   Encephalopathy Active Problems:   Essential hypertension   Insulin dependent diabetes mellitus ([place])   Hypothyroidism   History of seizures   Esophageal reflux   Type 2 diabetes mellitus with hyperglycemia, with long-term current use of insulin (HCC)   CAD S/P percutaneous coronary angioplasty   Chest pain   Anemia   GAD (generalized anxiety disorder)   Hyperkalemia    Subjective:   Brief Summary: Andrea [name] is a 68 y.o.femalewith medical history significant ofsevere triple-vessel coronary artery disease status post cardiac cath no PCI, poor candidate for bypass surgery, ischemic cardiomyopathy recently improved ejection fraction, multiple hospitalizations, diabetes on insulin, hypothyroidism, hypertension, seizure disorder, chronic pain disorder who presents to the emergency room with worsening shortness of breath and cough as well as chest pain and elephant walking on her chest.  She was initially somnolent and overnight became more combative, agitated and violent.  Further conversation was had with her son who states that the patient has been "overmedicated" and often becomes like a zombie.  The patient required restraints and a neuro eval was requested as well.    2/27-an EEG was ordered and revealed disorganized background activity bilaterally  and frequent left temporoparietal sharp transients of triphasic morphology.  She presented with acute diastolic CHF and chest pain and was adequately diuresed however later in her hospital stay, her Cr began to rise due to poor oral intake of fluids. Thus, Lasix is on hold until her Cr normalizes. It has continued to improve after IV hydration.   Hospital Course:  Principal Problem:   Encephalopathy in setting of narcotic use for chronic back pain -Thought to be secondary to narcotics and has resolved -Continue MS Contin at half home dose of 15 mg twice daily and wean slowly to off- stopped Lyrica and Oxycodone for now -started Voltaren gel for her back pain-will go to SNF for physical therapy  Active Problems: Acute diastolic heart failure - EF previously 30-35% but has normalized- see ECHO report below -CHF original reason for admission and has resolved with diuresis  Chest pain on admission -3 sets of troponin negative -Continue Plavix and Imdur  Vomiting x 1  -  ordered Pepcid BID  AKI- due to dehydration - Cr slowly rising from 1.81 on 3/1 to 3.02 - improved to 2.80 with slow IVF ordered by renal team- she had been on a thickened liquid diet after lethargy resolved and thus was not taking in enough liquids  B 12 deficiency -Vitamin B-12 level was 283 -replaced with IM B12 injections while in the hospital    Essential hypertension -Continue amlodipine, carvedilol & hydralazine    Insulin dependent diabetes mellitus -Continue Lantus and NovoLog sliding scale  Chronic anxiety - Continue BuSpar    Hypothyroidism -Continue Synthroid    History of seizures -Continue Depakote  Arthritis with chronic left knee pain Started Voltaren gel for this  and her back pain- avoid further increase of narcotics and avoid NSAIDs  2 D ECHO 03/05/18 . The left ventricle has has normal systolic function, with an ejection fraction of 55-60%. There is mildly increased left  ventricular wall thickness.  2. The right ventricle has normal systolic function. The cavity was normal.  3. The mitral valve is normal in structure. There is mild thickening. There is mild mitral annular calcification present.  4. The tricuspid valve was normal in structure.  5. The aortic root is normal in size and structure.  6. Limited study for LV function; doppler not performed; normal LV systolic function.  Discharge Exam: Vitals:   03/27/18 0354 03/27/18 0615  BP: (!) 158/56 (!) 167/69  Pulse: 88   Resp: 18   Temp: 98.6 F (37 C)   SpO2: 95%    Vitals:   03/26/18 1127 03/26/18 2020 03/27/18 0354 03/27/18 0615  BP: (!) 143/57 (!) 142/65 (!) 158/56 (!) 167/69  Pulse: 78 93 88   Resp: 18 16 18    Temp: 98.2 F (36.8 C) 98.2 F (36.8 C) 98.6 F (37 C)   TempSrc: Oral Oral Oral   SpO2: 92% 93% 95%   Weight:   70.3 kg   Height:        General: Pt is alert, awake, not in acute distress Cardiovascular: RRR, S1/S2 +, no rubs, no gallops Respiratory: CTA bilaterally, no wheezing, no rhonchi Abdominal: Soft, NT, ND, bowel sounds + Extremities: no edema, no cyanosis   Discharge Instructions  Discharge Instructions    (HEART FAILURE PATIENTS) Call MD:  Anytime you have any of the following symptoms: 1) 3 pound weight gain in 24 hours or 5 pounds in 1 week 2) shortness of breath, with or without a dry hacking cough 3) swelling in the hands, feet or stomach 4) if you have to sleep on extra pillows at night in order to breathe.   Complete by:  As directed    Diet - low sodium heart healthy   Complete by:  As directed    Diet Carb Modified   Complete by:  As directed    Increase activity slowly   Complete by:  As directed      Allergies as of 03/27/2018      Reactions   Ticagrelor Rash   Aspartame And Phenylalanine Hives, Diarrhea   Aspirin Nausea And Vomiting   Maxzide [hydrochlorothiazide W-triamterene] Swelling   Fluid retention   Metformin And Related Diarrhea    Nsaids Nausea And Vomiting   Other Other (See Comments)   All steroids produce psychosis per pt Artificial sweeteners produce nausea and upset stomach.   Pravachol [pravastatin] Other (See Comments)   unknown   Spironolactone    Stadol [butorphanol] Other (See Comments)   Toradol, etc And related- hallucinations   Toradol [ketorolac Tromethamine] Other (See Comments)   hallucinations   Vistaril [hydroxyzine Hcl] Other (See Comments)   unknown   Erythromycin Itching, Rash   Morphine And Related Hives, Rash   Iv morphine.   Penicillins Hives, Itching, Rash   Has patient had a PCN reaction causing immediate rash, facial/tongue/throat swelling, SOB or lightheadedness with hypotension: yes Has patient had a PCN reaction causing severe rash involving mucus membranes or skin necrosis: unknown Has patient had a PCN reaction that required hospitalization : unknown Has patient had a PCN reaction occurring within the last 10 years: no If all of the above answers are "NO", then may proceed with Cephalosporin use.  Medication List    STOP taking these medications   furosemide 20 MG tablet Commonly known as:  LASIX   morphine 30 MG 24 hr capsule Commonly known as:  KADIAN Replaced by:  morphine 15 MG 12 hr tablet   oxyCODONE-acetaminophen 10-325 MG tablet Commonly known as:  PERCOCET   pregabalin 75 MG capsule Commonly known as:  Lyrica   promethazine 25 MG tablet Commonly known as:  PHENERGAN     TAKE these medications   acetaminophen 325 MG tablet Commonly known as:  TYLENOL Take 2 tablets (650 mg total) by mouth every 6 (six) hours as needed for mild pain (or Fever >/= 101).   amLODipine 5 MG tablet Commonly known as:  NORVASC Take 5 mg by mouth daily.   atorvastatin 80 MG tablet Commonly known as:  LIPITOR Take 1 tablet (80 mg total) by mouth at bedtime.   busPIRone 15 MG tablet Commonly known as:  BUSPAR Take 15 mg by mouth 2 (two) times daily.   carvedilol  6.25 MG tablet Commonly known as:  COREG Take 1 tablet (6.25 mg total) by mouth 2 (two) times daily with a meal.   clopidogrel 75 MG tablet Commonly known as:  PLAVIX Take 1 tablet (75 mg total) by mouth daily.   diclofenac sodium 1 % Gel Commonly known as:  VOLTAREN Apply 4 g topically 4 (four) times daily. Apply to lower back and left knee QID   divalproex 250 MG DR tablet Commonly known as:  DEPAKOTE Take 1,000 mg by mouth at bedtime.   famotidine 20 MG tablet Commonly known as:  PEPCID Take 1 tablet (20 mg total) by mouth 2 (two) times daily.   feeding supplement (PRO-STAT SUGAR FREE 64) Liqd Take 30 mLs by mouth 2 (two) times daily.   hydrALAZINE 100 MG tablet Commonly known as:  APRESOLINE Take 1 tablet (100 mg total) by mouth every 8 (eight) hours.   insulin glargine 100 UNIT/ML injection Commonly known as:  LANTUS Inject 0.1 mLs (10 Units total) into the skin at bedtime. What changed:    how much to take  when to take this  additional instructions   isosorbide mononitrate 30 MG 24 hr tablet Commonly known as:  IMDUR Take 1 tablet (30 mg total) by mouth daily.   levothyroxine 200 MCG tablet Commonly known as:  SYNTHROID, LEVOTHROID Take 1 tablet (200 mcg total) by mouth at bedtime. What changed:  when to take this   morphine 15 MG 12 hr tablet Commonly known as:  MS CONTIN Take 1 tablet (15 mg total) by mouth 2 (two) times daily. Replaces:  morphine 30 MG 24 hr capsule   multivitamin with minerals Tabs tablet Take 1 tablet by mouth daily.   polyethylene glycol packet Commonly known as:  MIRALAX / GLYCOLAX Take 17 g by mouth daily as needed for mild constipation.   senna-docusate 8.6-50 MG tablet Commonly known as:  Senokot-S Take 2 tablets by mouth 2 (two) times daily.   Vitamin D (Ergocalciferol) 1.25 MG (50000 UT) Caps capsule Commonly known as:  DRISDOL Take 50,000 Units by mouth every Tuesday.      Contact information for after-discharge  care    Destination    [facility] .   Service:  Skilled Nursing Contact information: [address] [place] [phone]             Allergies  Allergen Reactions  . Ticagrelor Rash  . Aspartame And Phenylalanine Hives  and Diarrhea  . Aspirin Nausea And Vomiting  . Maxzide [Hydrochlorothiazide W-Triamterene] Swelling    Fluid retention  . Metformin And Related Diarrhea  . Nsaids Nausea And Vomiting  . Other Other (See Comments)    All steroids produce psychosis per pt Artificial sweeteners produce nausea and upset stomach.  Gregary Cromer [Pravastatin] Other (See Comments)    unknown  . Spironolactone   . Stadol [Butorphanol] Other (See Comments)    Toradol, etc And related- hallucinations  . Toradol [Ketorolac Tromethamine] Other (See Comments)    hallucinations  . Vistaril [Hydroxyzine Hcl] Other (See Comments)    unknown  . Erythromycin Itching and Rash  . Morphine And Related Hives and Rash    Iv morphine.  . Penicillins Hives, Itching and Rash    Has patient had a PCN reaction causing immediate rash, facial/tongue/throat swelling, SOB or lightheadedness with hypotension: yes Has patient had a PCN reaction causing severe rash involving mucus membranes or skin necrosis: unknown Has patient had a PCN reaction that required hospitalization : unknown Has patient had a PCN reaction occurring within the last 10 years: no If all of the above answers are "NO", then may proceed with Cephalosporin use.      Procedures/Studies:   Ct Abdomen Pelvis Wo Contrast  Result Date: 03/01/2018 CLINICAL DATA:  68 year old female with history of intermittent vomiting. EXAM: CT ABDOMEN AND PELVIS WITHOUT CONTRAST TECHNIQUE: Multidetector CT imaging of the abdomen and pelvis was performed following the standard protocol without IV contrast. COMPARISON:  CT the abdomen and pelvis 06/01/2017. FINDINGS: Lower chest: Bilateral breast implants are  incidentally noted. Atherosclerotic calcifications in the descending thoracic aorta as well as the left anterior descending and right coronary arteries. Small bilateral pleural effusions lying dependently. This is associated with some passive atelectasis in the lower lobes of the lungs bilaterally. Septal thickening throughout the visualize lung bases concerning for a background of mild interstitial pulmonary edema. Hepatobiliary: No definite suspicious cystic or solid hepatic lesions are confidently identified on today's noncontrast CT examination. Status post cholecystectomy. Pancreas: No definite pancreatic mass or peripancreatic fluid or inflammatory changes are noted on today's noncontrast CT examination. Spleen: Unremarkable. Adrenals/Urinary Tract: Left-sided pelvic kidney. No calculi identified within the collecting system of either kidney, along the course of either ureter or within the lumen of the urinary bladder. No hydroureteronephrosis. Urinary bladder is normal in appearance. Stomach/Bowel: Unenhanced appearance of the stomach is normal. No pathologic dilatation of small bowel or colon. The appendix is not confidently identified and may be surgically absent. Regardless, there are no inflammatory changes noted adjacent to the cecum to suggest the presence of an acute appendicitis at this time. Vascular/Lymphatic: Aortic atherosclerosis. No lymphadenopathy noted in the abdomen or pelvis. Reproductive: Status post hysterectomy. Ovaries are not confidently identified may be surgically absent or atrophic. Other: No significant volume of ascites.  No pneumoperitoneum. Musculoskeletal: There are no aggressive appearing lytic or blastic lesions noted in the visualized portions of the skeleton. IMPRESSION: 1. No acute findings are noted in the abdomen or pelvis to account for the patient's symptoms. 2. The appearance of the lung bases suggests interstitial pulmonary edema with small bilateral pleural  effusions. 3. Aortic atherosclerosis, in addition to least 2 vessel coronary artery disease. Please note that although the presence of coronary artery calcium documents the presence of coronary artery disease, the severity of this disease and any potential stenosis cannot be assessed on this non-gated CT examination. Assessment for potential risk factor modification, dietary therapy  or pharmacologic therapy may be warranted, if clinically indicated. 4. Additional incidental findings, as above. Electronically Signed   By: Vinnie Langton M.D.   On: 03/01/2018 16:24   Dg Chest 2 View  Result Date: 03/15/2018 CLINICAL DATA:  Chest pain. EXAM: CHEST - 2 VIEW COMPARISON:  03/04/2018 FINDINGS: Enlarged cardiac silhouette. Mediastinal contours appear intact. Patchy interstitial and alveolar opacities with lower lobe predominance. Small bilateral pleural effusions can not be excluded. Osseous structures are without acute abnormality. Soft tissues are grossly normal. IMPRESSION: 1. Enlarged cardiac silhouette. 2. Patchy interstitial and alveolar opacities with lower lobe predominance. Findings may represent mixed pattern pulmonary edema or multifocal pneumonia. 3. Small bilateral pleural effusions can not be excluded. Electronically Signed   By: Fidela Salisbury M.D.   On: 03/15/2018 08:57   Dg Chest 2 View  Result Date: 03/04/2018 CLINICAL DATA:  68 year old female with a history of shortness of breath EXAM: CHEST - 2 VIEW COMPARISON:  02/25/2018, 02/23/2018 01/20/2018 FINDINGS: Cardiomediastinal silhouette unchanged in size and contour. Increasing opacity at the right lung base with obscuration the right hemidiaphragm and right heart or. Blunting at the left costophrenic angle. Opacity in sulcus on the lateral view with thickening of the fissures. Interlobular septal thickening. No pneumothorax. IMPRESSION: Increasing opacities at the lung bases compatible with increasing pleural effusions with background of  edema. Multifocal infection not excluded. Electronically Signed   By: Corrie Mckusick D.O.   On: 03/04/2018 19:32   Dg Chest 2 View  Result Date: 02/25/2018 CLINICAL DATA:  Hypoxia. EXAM: CHEST - 2 VIEW COMPARISON:  December 24, 2018 FINDINGS: No pneumothorax. The heart size is borderline. The hila and mediastinum are unremarkable. Small bilateral pleural effusions. No nodules, masses, or suspicious infiltrates. IMPRESSION: Small bilateral pleural effusions. Possible mild pulmonary venous congestion. No overt edema. Electronically Signed   By: Dorise Bullion III M.D   On: 02/25/2018 11:18   Dg Abd 1 View  Result Date: 03/22/2018 CLINICAL DATA:  Acute vomiting. EXAM: ABDOMEN - 1 VIEW COMPARISON:  03/01/2018 CT and prior studies . FINDINGS: The bowel gas pattern is unremarkable. No dilated bowel loops noted. No suspicious calcifications are present. Cholecystectomy clips are present. IMPRESSION: No acute abnormality. Electronically Signed   By: Margarette Canada M.D.   On: 03/22/2018 13:51   Ct Head Wo Contrast  Result Date: 03/17/2018 CLINICAL DATA:  Altered mental status EXAM: CT HEAD WITHOUT CONTRAST TECHNIQUE: Contiguous axial images were obtained from the base of the skull through the vertex without intravenous contrast. COMPARISON:  06/04/2017 FINDINGS: Brain: No mass, hemorrhage or extra-axial collection. There are multiple old lacunar infarcts of the basal ganglia. There is generalized hypoattenuation of the periventricular white matter consistent with small vessel ischemia. Gray-white differentiation is maintained. Vascular: Atherosclerotic calcification of the internal carotid arteries at the skull base. No abnormal hyperdensity of the major intracranial arteries or dural venous sinuses. Skull: The visualized skull base, calvarium and extracranial soft tissues are normal. Sinuses/Orbits: Small sphenoid sinus retention cyst. No mastoid or middle ear effusion. The orbits are normal. IMPRESSION: 1. No acute  intracranial abnormality. 2. 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 Electronically Signed   By: Ulyses Jarred M.D.   On: 03/17/2018 15:09   Mr Brain Wo Contrast  Result Date: 03/17/2018 CLINICAL DATA:  Encephalopathy, altered mental status. History of hypertension, hyperlipidemia, breast cancer, migraine and seizure. EXAM: MRI HEAD WITHOUT CONTRAST TECHNIQUE: Multiplanar, multiecho pulse sequences of the brain and surrounding structures were  obtained without intravenous contrast. Sagittal T1 sequence not obtained due to patient motion. COMPARISON:  CT HEAD March 17, 2018 FINDINGS: Moderately motion degraded examination. INTRACRANIAL CONTENTS: No reduced diffusion to suggest acute ischemia. No susceptibility artifact to suggest lobar hematoma; motion degrades detection of microhemorrhages. Old bilateral basal ganglia and RIGHT thalamus small infarcts with mild ex vacuo dilatation subjacent ventricle. Mild parenchymal brain volume loss. No hydrocephalus. Patchy supratentorial white matter FLAIR T2 hyperintensities. No suspicious parenchymal signal, masses, mass effect. No abnormal extra-axial fluid collections. No extra-axial masses. VASCULAR: Normal major intracranial vascular flow voids present at skull base. SKULL AND UPPER CERVICAL SPINE: No abnormal sellar expansion. No suspicious calvarial bone marrow signal. Craniocervical junction maintained. SINUSES/ORBITS: Trace RIGHT mastoid effusion. Included ocular globes and orbital contents are non-suspicious. Status post bilateral ocular lens implants. OTHER: None. IMPRESSION: 1. No acute intracranial process on this motion degraded examination. 2. 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 Mild chronic small vessel ischemic changes. 3. Mild parenchymal brain volume loss for age. Electronically Signed   By: Elon Alas M.D.   On: 03/17/2018 23:00   Dg Chest Port 1 View  Result Date:  03/17/2018 CLINICAL DATA:  Hypoxia. EXAM: PORTABLE CHEST 1 VIEW COMPARISON:  03/15/2018 FINDINGS: There is moderate cardiac enlargement. Small pleural effusions and moderate interstitial edema is identified. No airspace consolidation. The visualized osseous structures are unremarkable. IMPRESSION: 1. Congestive heart failure.  Unchanged from previous exam. Electronically Signed   By: Kerby Moors M.D.   On: 03/17/2018 08:18     The results of significant diagnostics from this hospitalization (including imaging, microbiology, ancillary and laboratory) are listed below for reference.     Microbiology: No results found for this or any previous visit (from the past 240 hour(s)).   Labs: BNP (last 3 results) Recent Labs    02/23/18 0513 03/05/18 0500 03/15/18 0729  BNP 1,296.3* 1,123.8* 0,102.7*   Basic Metabolic Panel: Recent Labs  Lab 03/23/18 0443 03/24/18 0426 03/24/18 2300 03/26/18 0641 03/27/18 0431  NA 146* 146* 140 137 138  K 3.4* 4.1 4.4 4.6 4.6  CL 111 110 104 97* 102  CO2 26 30 27 27 27   GLUCOSE 164* 102* 107* 130* 102*  BUN 117* 123* 135* 140* 138*  CREATININE 2.14* 2.43* 2.95* 3.02* 2.80*  CALCIUM 7.5* 8.2* 7.8* 7.9* 7.9*  MG  --  2.8*  --   --   --    Liver Function Tests: No results for input(s): AST, ALT, ALKPHOS, BILITOT, PROT, ALBUMIN in the last 168 hours. No results for input(s): LIPASE, AMYLASE in the last 168 hours. No results for input(s): AMMONIA in the last 168 hours. CBC: Recent Labs  Lab 03/21/18 0642  WBC 7.3  HGB 9.7*  HCT 29.8*  MCV 90.9  PLT 210   Cardiac Enzymes: No results for input(s): CKTOTAL, CKMB, CKMBINDEX, TROPONINI in the last 168 hours. BNP: Invalid input(s): POCBNP CBG: Recent Labs  Lab 03/26/18 1131 03/26/18 1205 03/26/18 1637 03/26/18 2217 03/27/18 0608  GLUCAP 159* 130* 134* 110* 98   D-Dimer No results for input(s): DDIMER in the last 72 hours. Hgb A1c No results for input(s): HGBA1C in the last 72  hours. Lipid Profile No results for input(s): CHOL, HDL, LDLCALC, TRIG, CHOLHDL, LDLDIRECT in the last 72 hours. Thyroid function studies No results for input(s): TSH, T4TOTAL, T3FREE, THYROIDAB in the last 72 hours.  Invalid input(s): FREET3 Anemia work up No results for input(s): VITAMINB12, FOLATE, FERRITIN, TIBC, IRON, RETICCTPCT in the last  72 hours. Urinalysis    Component Value Date/Time   COLORURINE YELLOW 02/25/2018 2316   APPEARANCEUR HAZY (A) 02/25/2018 2316   LABSPEC 1.014 02/25/2018 2316   PHURINE 5.0 02/25/2018 2316   GLUCOSEU NEGATIVE 02/25/2018 2316   HGBUR SMALL (A) 02/25/2018 2316   BILIRUBINUR NEGATIVE 02/25/2018 2316   KETONESUR NEGATIVE 02/25/2018 2316   PROTEINUR >=300 (A) 02/25/2018 2316   UROBILINOGEN 1.0 12/02/2013 1612   NITRITE NEGATIVE 02/25/2018 2316   LEUKOCYTESUR SMALL (A) 02/25/2018 2316   Sepsis Labs Invalid input(s): PROCALCITONIN,  WBC,  LACTICIDVEN Microbiology No results found for this or any previous visit (from the past 240 hour(s)).   Time coordinating discharge in minutes: 65  SIGNED:   Debbe Odea, MD  [facility] 03/27/2018, 9:21 AM Pager   If 7PM-7AM, please contact night-coverage [email or web address] Password [email or web address]

## 2018-03-27 NOTE — Progress Notes (Signed)
Report attempted x2 to facility. Left message.   Fritz Pickerel, RN

## 2018-03-27 NOTE — Care Management Important Message (Signed)
Important Message  Patient Details  Name: Andrea [name] MRN: [id number] Date of Birth: Oct 10, 1950   Medicare Important Message Given:  Yes    Karilyn Wind P [place] 03/27/2018, 11:14 AM

## 2018-03-31 ENCOUNTER — Ambulatory Visit: Payer: Medicare Other | Admitting: Cardiology

## 2018-04-11 ENCOUNTER — Encounter (HOSPITAL_COMMUNITY): Payer: Self-pay | Admitting: Emergency Medicine

## 2018-04-11 ENCOUNTER — Inpatient Hospital Stay (HOSPITAL_COMMUNITY)
Admission: EM | Admit: 2018-04-11 | Discharge: 2018-04-26 | DRG: 190 | Disposition: A | Discharge status: Skilled Nursing Facility | Payer: Medicare Other | Attending: Internal Medicine | Admitting: Internal Medicine

## 2018-04-11 ENCOUNTER — Emergency Department (HOSPITAL_COMMUNITY): Payer: Medicare Other

## 2018-04-11 ENCOUNTER — Other Ambulatory Visit: Payer: Self-pay

## 2018-04-11 DIAGNOSIS — K219 Gastro-esophageal reflux disease without esophagitis: Secondary | ICD-10-CM | POA: Diagnosis present

## 2018-04-11 DIAGNOSIS — N9089 Other specified noninflammatory disorders of vulva and perineum: Secondary | ICD-10-CM | POA: Diagnosis present

## 2018-04-11 DIAGNOSIS — N184 Chronic kidney disease, stage 4 (severe): Secondary | ICD-10-CM | POA: Diagnosis present

## 2018-04-11 DIAGNOSIS — Z955 Presence of coronary angioplasty implant and graft: Secondary | ICD-10-CM

## 2018-04-11 DIAGNOSIS — Z9049 Acquired absence of other specified parts of digestive tract: Secondary | ICD-10-CM

## 2018-04-11 DIAGNOSIS — Z881 Allergy status to other antibiotic agents status: Secondary | ICD-10-CM

## 2018-04-11 DIAGNOSIS — R0602 Shortness of breath: Secondary | ICD-10-CM | POA: Diagnosis present

## 2018-04-11 DIAGNOSIS — F411 Generalized anxiety disorder: Secondary | ICD-10-CM | POA: Diagnosis present

## 2018-04-11 DIAGNOSIS — Z79890 Hormone replacement therapy (postmenopausal): Secondary | ICD-10-CM

## 2018-04-11 DIAGNOSIS — R040 Epistaxis: Secondary | ICD-10-CM | POA: Diagnosis not present

## 2018-04-11 DIAGNOSIS — D631 Anemia in chronic kidney disease: Secondary | ICD-10-CM | POA: Diagnosis present

## 2018-04-11 DIAGNOSIS — Z90710 Acquired absence of both cervix and uterus: Secondary | ICD-10-CM

## 2018-04-11 DIAGNOSIS — Z978 Presence of other specified devices: Secondary | ICD-10-CM

## 2018-04-11 DIAGNOSIS — I2510 Atherosclerotic heart disease of native coronary artery without angina pectoris: Secondary | ICD-10-CM | POA: Diagnosis present

## 2018-04-11 DIAGNOSIS — N179 Acute kidney failure, unspecified: Secondary | ICD-10-CM | POA: Diagnosis present

## 2018-04-11 DIAGNOSIS — E1122 Type 2 diabetes mellitus with diabetic chronic kidney disease: Secondary | ICD-10-CM | POA: Diagnosis present

## 2018-04-11 DIAGNOSIS — T45525A Adverse effect of antithrombotic drugs, initial encounter: Secondary | ICD-10-CM | POA: Diagnosis not present

## 2018-04-11 DIAGNOSIS — T17908A Unspecified foreign body in respiratory tract, part unspecified causing other injury, initial encounter: Secondary | ICD-10-CM

## 2018-04-11 DIAGNOSIS — I5033 Acute on chronic diastolic (congestive) heart failure: Secondary | ICD-10-CM | POA: Diagnosis present

## 2018-04-11 DIAGNOSIS — J440 Chronic obstructive pulmonary disease with acute lower respiratory infection: Secondary | ICD-10-CM | POA: Diagnosis present

## 2018-04-11 DIAGNOSIS — Z79891 Long term (current) use of opiate analgesic: Secondary | ICD-10-CM

## 2018-04-11 DIAGNOSIS — E039 Hypothyroidism, unspecified: Secondary | ICD-10-CM | POA: Diagnosis present

## 2018-04-11 DIAGNOSIS — E119 Type 2 diabetes mellitus without complications: Secondary | ICD-10-CM

## 2018-04-11 DIAGNOSIS — R079 Chest pain, unspecified: Secondary | ICD-10-CM | POA: Diagnosis present

## 2018-04-11 DIAGNOSIS — Z7902 Long term (current) use of antithrombotics/antiplatelets: Secondary | ICD-10-CM

## 2018-04-11 DIAGNOSIS — I255 Ischemic cardiomyopathy: Secondary | ICD-10-CM | POA: Diagnosis present

## 2018-04-11 DIAGNOSIS — G8929 Other chronic pain: Secondary | ICD-10-CM | POA: Diagnosis present

## 2018-04-11 DIAGNOSIS — J9601 Acute respiratory failure with hypoxia: Secondary | ICD-10-CM | POA: Diagnosis not present

## 2018-04-11 DIAGNOSIS — Z9102 Food additives allergy status: Secondary | ICD-10-CM

## 2018-04-11 DIAGNOSIS — J189 Pneumonia, unspecified organism: Secondary | ICD-10-CM

## 2018-04-11 DIAGNOSIS — E785 Hyperlipidemia, unspecified: Secondary | ICD-10-CM | POA: Diagnosis present

## 2018-04-11 DIAGNOSIS — Z79899 Other long term (current) drug therapy: Secondary | ICD-10-CM

## 2018-04-11 DIAGNOSIS — Z885 Allergy status to narcotic agent status: Secondary | ICD-10-CM

## 2018-04-11 DIAGNOSIS — D649 Anemia, unspecified: Secondary | ICD-10-CM

## 2018-04-11 DIAGNOSIS — Z880 Allergy status to penicillin: Secondary | ICD-10-CM

## 2018-04-11 DIAGNOSIS — E875 Hyperkalemia: Secondary | ICD-10-CM | POA: Diagnosis present

## 2018-04-11 DIAGNOSIS — G43909 Migraine, unspecified, not intractable, without status migrainosus: Secondary | ICD-10-CM | POA: Diagnosis present

## 2018-04-11 DIAGNOSIS — H9190 Unspecified hearing loss, unspecified ear: Secondary | ICD-10-CM | POA: Diagnosis present

## 2018-04-11 DIAGNOSIS — R062 Wheezing: Secondary | ICD-10-CM | POA: Diagnosis not present

## 2018-04-11 DIAGNOSIS — N939 Abnormal uterine and vaginal bleeding, unspecified: Secondary | ICD-10-CM | POA: Diagnosis not present

## 2018-04-11 DIAGNOSIS — J121 Respiratory syncytial virus pneumonia: Secondary | ICD-10-CM

## 2018-04-11 DIAGNOSIS — Z9013 Acquired absence of bilateral breasts and nipples: Secondary | ICD-10-CM

## 2018-04-11 DIAGNOSIS — R072 Precordial pain: Secondary | ICD-10-CM | POA: Diagnosis not present

## 2018-04-11 DIAGNOSIS — G40909 Epilepsy, unspecified, not intractable, without status epilepticus: Secondary | ICD-10-CM | POA: Diagnosis present

## 2018-04-11 DIAGNOSIS — Z888 Allergy status to other drugs, medicaments and biological substances status: Secondary | ICD-10-CM

## 2018-04-11 DIAGNOSIS — Z886 Allergy status to analgesic agent status: Secondary | ICD-10-CM

## 2018-04-11 DIAGNOSIS — R042 Hemoptysis: Secondary | ICD-10-CM | POA: Diagnosis not present

## 2018-04-11 DIAGNOSIS — I130 Hypertensive heart and chronic kidney disease with heart failure and stage 1 through stage 4 chronic kidney disease, or unspecified chronic kidney disease: Secondary | ICD-10-CM | POA: Diagnosis present

## 2018-04-11 DIAGNOSIS — Z66 Do not resuscitate: Secondary | ICD-10-CM | POA: Diagnosis present

## 2018-04-11 DIAGNOSIS — I5023 Acute on chronic systolic (congestive) heart failure: Secondary | ICD-10-CM

## 2018-04-11 DIAGNOSIS — Z853 Personal history of malignant neoplasm of breast: Secondary | ICD-10-CM

## 2018-04-11 DIAGNOSIS — G4700 Insomnia, unspecified: Secondary | ICD-10-CM | POA: Diagnosis present

## 2018-04-11 DIAGNOSIS — Z794 Long term (current) use of insulin: Secondary | ICD-10-CM

## 2018-04-11 HISTORY — DX: Heart failure, unspecified: I50.9

## 2018-04-11 LAB — CBC WITH DIFFERENTIAL/PLATELET
Abs Immature Granulocytes: 0.03 10*3/uL (ref 0.00–0.07)
Basophils Absolute: 0 10*3/uL (ref 0.0–0.1)
Basophils Relative: 0 %
EOS ABS: 0 10*3/uL (ref 0.0–0.5)
EOS PCT: 0 %
HEMATOCRIT: 22.7 % — AB (ref 36.0–46.0)
Hemoglobin: 7 g/dL — ABNORMAL LOW (ref 12.0–15.0)
Immature Granulocytes: 1 %
LYMPHS ABS: 0.5 10*3/uL — AB (ref 0.7–4.0)
LYMPHS PCT: 10 %
MCH: 29.7 pg (ref 26.0–34.0)
MCHC: 30.8 g/dL (ref 30.0–36.0)
MCV: 96.2 fL (ref 80.0–100.0)
Monocytes Absolute: 0.3 10*3/uL (ref 0.1–1.0)
Monocytes Relative: 6 %
Neutro Abs: 4 10*3/uL (ref 1.7–7.7)
Neutrophils Relative %: 83 %
Platelets: 220 10*3/uL (ref 150–400)
RBC: 2.36 MIL/uL — ABNORMAL LOW (ref 3.87–5.11)
RDW: 14 % (ref 11.5–15.5)
WBC: 4.9 10*3/uL (ref 4.0–10.5)
nRBC: 0 % (ref 0.0–0.2)

## 2018-04-11 LAB — COMPREHENSIVE METABOLIC PANEL
ALT: 15 U/L (ref 0–44)
ANION GAP: 11 (ref 5–15)
AST: 39 U/L (ref 15–41)
Albumin: 2.3 g/dL — ABNORMAL LOW (ref 3.5–5.0)
Alkaline Phosphatase: 47 U/L (ref 38–126)
BILIRUBIN TOTAL: 1 mg/dL (ref 0.3–1.2)
BUN: 36 mg/dL — ABNORMAL HIGH (ref 8–23)
CALCIUM: 8 mg/dL — AB (ref 8.9–10.3)
CO2: 20 mmol/L — ABNORMAL LOW (ref 22–32)
Chloride: 105 mmol/L (ref 98–111)
Creatinine, Ser: 2.13 mg/dL — ABNORMAL HIGH (ref 0.44–1.00)
GFR calc Af Amer: 27 mL/min — ABNORMAL LOW (ref >60)
GFR calc non Af Amer: 24 mL/min — ABNORMAL LOW (ref >60)
Glucose, Bld: 186 mg/dL — ABNORMAL HIGH (ref 70–99)
Potassium: 5.6 mmol/L — ABNORMAL HIGH (ref 3.5–5.1)
Sodium: 136 mmol/L (ref 135–145)
Total Protein: 5.9 g/dL — ABNORMAL LOW (ref 6.5–8.1)

## 2018-04-11 LAB — RESPIRATORY PANEL BY PCR
Adenovirus: NOT DETECTED
Bordetella pertussis: NOT DETECTED
CORONAVIRUS 229E-RVPPCR: NOT DETECTED
CORONAVIRUS HKU1-RVPPCR: NOT DETECTED
Chlamydophila pneumoniae: NOT DETECTED
Coronavirus NL63: NOT DETECTED
Coronavirus OC43: NOT DETECTED
Influenza A: NOT DETECTED
Influenza B: NOT DETECTED
Metapneumovirus: NOT DETECTED
Mycoplasma pneumoniae: NOT DETECTED
PARAINFLUENZA VIRUS 1-RVPPCR: NOT DETECTED
Parainfluenza Virus 2: NOT DETECTED
Parainfluenza Virus 3: NOT DETECTED
Parainfluenza Virus 4: NOT DETECTED
Respiratory Syncytial Virus: DETECTED — AB
Rhinovirus / Enterovirus: NOT DETECTED

## 2018-04-11 LAB — POCT I-STAT EG7
Acid-Base Excess: 3 mmol/L — ABNORMAL HIGH (ref 0.0–2.0)
Bicarbonate: 25.8 mmol/L (ref 20.0–28.0)
CALCIUM ION: 1.04 mmol/L — AB (ref 1.15–1.40)
HCT: 20 % — ABNORMAL LOW (ref 36.0–46.0)
Hemoglobin: 6.8 g/dL — CL (ref 12.0–15.0)
O2 Saturation: 99 %
Potassium: 5.8 mmol/L — ABNORMAL HIGH (ref 3.5–5.1)
Sodium: 136 mmol/L (ref 135–145)
TCO2: 27 mmol/L (ref 22–32)
pCO2, Ven: 32.5 mmHg — ABNORMAL LOW (ref 44.0–60.0)
pH, Ven: 7.507 — ABNORMAL HIGH (ref 7.250–7.430)
pO2, Ven: 103 mmHg — ABNORMAL HIGH (ref 32.0–45.0)

## 2018-04-11 LAB — BASIC METABOLIC PANEL
Anion gap: 10 (ref 5–15)
BUN: 35 mg/dL — ABNORMAL HIGH (ref 8–23)
CO2: 23 mmol/L (ref 22–32)
Calcium: 8.2 mg/dL — ABNORMAL LOW (ref 8.9–10.3)
Chloride: 104 mmol/L (ref 98–111)
Creatinine, Ser: 2.04 mg/dL — ABNORMAL HIGH (ref 0.44–1.00)
GFR calc Af Amer: 29 mL/min — ABNORMAL LOW (ref >60)
GFR calc non Af Amer: 25 mL/min — ABNORMAL LOW (ref >60)
Glucose, Bld: 148 mg/dL — ABNORMAL HIGH (ref 70–99)
Potassium: 4.9 mmol/L (ref 3.5–5.1)
Sodium: 137 mmol/L (ref 135–145)

## 2018-04-11 LAB — BRAIN NATRIURETIC PEPTIDE: B Natriuretic Peptide: 1045 pg/mL — ABNORMAL HIGH (ref 0.0–100.0)

## 2018-04-11 LAB — GLUCOSE, CAPILLARY
Glucose-Capillary: 136 mg/dL — ABNORMAL HIGH (ref 70–99)
Glucose-Capillary: 100 mg/dL — ABNORMAL HIGH (ref 70–99)
Glucose-Capillary: 98 mg/dL (ref 70–99)

## 2018-04-11 LAB — LACTIC ACID, PLASMA: Lactic Acid, Venous: 0.9 mmol/L (ref 0.5–1.9)

## 2018-04-11 LAB — TROPONIN I
Troponin I: <0.03 ng/mL (ref <0.03)
Troponin I: 0.05 ng/mL (ref <0.03)
Troponin I: 0.06 ng/mL (ref <0.03)

## 2018-04-11 LAB — PREPARE RBC (CROSSMATCH)

## 2018-04-11 MED ORDER — ACETAMINOPHEN 650 MG RE SUPP: 650.0000 mg | Freq: Four times a day (QID) | RECTAL | Status: DC | PRN

## 2018-04-11 MED ORDER — SODIUM CHLORIDE 0.9% FLUSH: 3.0000 mL | INTRAVENOUS | Status: DC | PRN

## 2018-04-11 MED ORDER — CARBAMIDE PEROXIDE 6.5 % OT SOLN
5.0000 [drp] | Freq: Two times a day (BID) | OTIC | Status: DC
  Administered 2018-04-11 – 2018-04-26 (×30): 5 [drp] via OTIC
  Filled 2018-04-11 (×2): qty 15

## 2018-04-11 MED ORDER — SODIUM CHLORIDE 0.9% FLUSH
3.0000 mL | Freq: Two times a day (BID) | INTRAVENOUS | Status: DC
  Administered 2018-04-11 – 2018-04-18 (×12): 3 mL via INTRAVENOUS

## 2018-04-11 MED ORDER — HYDRALAZINE HCL 50 MG PO TABS
100.0000 mg | ORAL_TABLET | Freq: Three times a day (TID) | ORAL | Status: DC
  Administered 2018-04-11 – 2018-04-26 (×45): 100 mg via ORAL
  Filled 2018-04-11 (×46): qty 2

## 2018-04-11 MED ORDER — INSULIN GLARGINE 100 UNIT/ML ~~LOC~~ SOLN
10.0000 [IU] | Freq: Every day | SUBCUTANEOUS | Status: DC
  Administered 2018-04-11 – 2018-04-15 (×4): 10 [IU] via SUBCUTANEOUS
  Filled 2018-04-11 (×5): qty 0.1

## 2018-04-11 MED ORDER — HEPARIN SODIUM (PORCINE) 5000 UNIT/ML IJ SOLN: 5000.0000 [IU] | Freq: Three times a day (TID) | INTRAMUSCULAR | Status: DC

## 2018-04-11 MED ORDER — PANTOPRAZOLE SODIUM 40 MG PO TBEC
40.0000 mg | DELAYED_RELEASE_TABLET | Freq: Every day | ORAL | Status: DC
  Administered 2018-04-12 – 2018-04-26 (×15): 40 mg via ORAL
  Filled 2018-04-11 (×16): qty 1

## 2018-04-11 MED ORDER — BUSPIRONE HCL 5 MG PO TABS
15.0000 mg | ORAL_TABLET | Freq: Two times a day (BID) | ORAL | Status: DC
  Administered 2018-04-11 – 2018-04-26 (×31): 15 mg via ORAL
  Filled 2018-04-11: qty 3
  Filled 2018-04-11: qty 1
  Filled 2018-04-11 (×2): qty 3
  Filled 2018-04-11: qty 1
  Filled 2018-04-11: qty 3
  Filled 2018-04-11 (×2): qty 1
  Filled 2018-04-11 (×2): qty 3
  Filled 2018-04-11 (×4): qty 1
  Filled 2018-04-11: qty 3
  Filled 2018-04-11 (×2): qty 1
  Filled 2018-04-11 (×3): qty 3
  Filled 2018-04-11 (×2): qty 1
  Filled 2018-04-11: qty 3
  Filled 2018-04-11: qty 1
  Filled 2018-04-11: qty 3
  Filled 2018-04-11: qty 1
  Filled 2018-04-11: qty 3
  Filled 2018-04-11 (×2): qty 1
  Filled 2018-04-11: qty 3
  Filled 2018-04-11: qty 1
  Filled 2018-04-11: qty 3
  Filled 2018-04-11: qty 1
  Filled 2018-04-11: qty 2

## 2018-04-11 MED ORDER — ATORVASTATIN CALCIUM 80 MG PO TABS
80.0000 mg | ORAL_TABLET | Freq: Every day | ORAL | Status: DC
  Administered 2018-04-11 – 2018-04-25 (×15): 80 mg via ORAL
  Filled 2018-04-11 (×15): qty 1

## 2018-04-11 MED ORDER — MORPHINE SULFATE ER 15 MG PO TBCR
15.0000 mg | EXTENDED_RELEASE_TABLET | Freq: Two times a day (BID) | ORAL | Status: DC
  Administered 2018-04-11 – 2018-04-12 (×4): 15 mg via ORAL
  Filled 2018-04-11 (×4): qty 1

## 2018-04-11 MED ORDER — DIVALPROEX SODIUM 250 MG PO DR TAB
1000.0000 mg | DELAYED_RELEASE_TABLET | Freq: Every day | ORAL | Status: DC
  Administered 2018-04-11 – 2018-04-26 (×16): 1000 mg via ORAL
  Filled 2018-04-11 (×3): qty 2
  Filled 2018-04-11 (×3): qty 4
  Filled 2018-04-11: qty 2
  Filled 2018-04-11 (×2): qty 4
  Filled 2018-04-11: qty 2
  Filled 2018-04-11 (×4): qty 4
  Filled 2018-04-11 (×4): qty 2

## 2018-04-11 MED ORDER — ACETAMINOPHEN 325 MG PO TABS
650.0000 mg | ORAL_TABLET | Freq: Four times a day (QID) | ORAL | Status: DC | PRN
  Administered 2018-04-12 – 2018-04-13 (×2): 650 mg via ORAL
  Filled 2018-04-11 (×3): qty 2

## 2018-04-11 MED ORDER — LEVOTHYROXINE SODIUM 100 MCG PO TABS
200.0000 ug | ORAL_TABLET | Freq: Every day | ORAL | Status: DC
  Administered 2018-04-11 – 2018-04-26 (×14): 200 ug via ORAL
  Filled 2018-04-11 (×16): qty 2

## 2018-04-11 MED ORDER — INSULIN ASPART 100 UNIT/ML ~~LOC~~ SOLN
0.0000 [IU] | Freq: Every day | SUBCUTANEOUS | Status: DC
  Administered 2018-04-25: 3 [IU] via SUBCUTANEOUS

## 2018-04-11 MED ORDER — SODIUM CHLORIDE 0.9 % IV SOLN
10.0000 mL/h | Freq: Once | INTRAVENOUS | Status: AC
  Administered 2018-04-11: 10 mL/h via INTRAVENOUS

## 2018-04-11 MED ORDER — SODIUM CHLORIDE 0.9 % IV SOLN: 250.0000 mL | INTRAVENOUS | Status: DC | PRN

## 2018-04-11 MED ORDER — LORAZEPAM 2 MG/ML IJ SOLN
0.5000 mg | Freq: Once | INTRAMUSCULAR | Status: AC
  Administered 2018-04-11: 0.5 mg via INTRAVENOUS
  Filled 2018-04-11: qty 1

## 2018-04-11 MED ORDER — INSULIN ASPART 100 UNIT/ML ~~LOC~~ SOLN
0.0000 [IU] | Freq: Three times a day (TID) | SUBCUTANEOUS | Status: DC
  Administered 2018-04-11 – 2018-04-13 (×2): 1 [IU] via SUBCUTANEOUS
  Administered 2018-04-13 – 2018-04-15 (×2): 2 [IU] via SUBCUTANEOUS
  Administered 2018-04-16 – 2018-04-18 (×3): 1 [IU] via SUBCUTANEOUS
  Administered 2018-04-18: 2 [IU] via SUBCUTANEOUS
  Administered 2018-04-19: 1 [IU] via SUBCUTANEOUS
  Administered 2018-04-20 – 2018-04-22 (×4): 2 [IU] via SUBCUTANEOUS
  Administered 2018-04-22: 1 [IU] via SUBCUTANEOUS
  Administered 2018-04-23: 2 [IU] via SUBCUTANEOUS
  Administered 2018-04-23 (×2): 1 [IU] via SUBCUTANEOUS
  Administered 2018-04-24: 17:00:00 3 [IU] via SUBCUTANEOUS
  Administered 2018-04-24 – 2018-04-26 (×6): 2 [IU] via SUBCUTANEOUS

## 2018-04-11 MED ORDER — ONDANSETRON HCL 4 MG PO TABS
4.0000 mg | ORAL_TABLET | Freq: Four times a day (QID) | ORAL | Status: DC | PRN
  Administered 2018-04-16 – 2018-04-26 (×9): 4 mg via ORAL
  Filled 2018-04-11 (×11): qty 1

## 2018-04-11 MED ORDER — ONDANSETRON HCL 4 MG/2ML IJ SOLN
4.0000 mg | Freq: Four times a day (QID) | INTRAMUSCULAR | Status: DC | PRN
  Administered 2018-04-18 – 2018-04-26 (×9): 4 mg via INTRAVENOUS
  Filled 2018-04-11 (×10): qty 2

## 2018-04-11 MED ORDER — FUROSEMIDE 10 MG/ML IJ SOLN
40.0000 mg | Freq: Once | INTRAMUSCULAR | Status: AC
  Administered 2018-04-11: 40 mg via INTRAVENOUS
  Filled 2018-04-11: qty 4

## 2018-04-11 MED ORDER — ISOSORBIDE MONONITRATE ER 30 MG PO TB24
30.0000 mg | ORAL_TABLET | Freq: Every day | ORAL | Status: DC
  Administered 2018-04-11 – 2018-04-17 (×7): 30 mg via ORAL
  Filled 2018-04-11 (×7): qty 1

## 2018-04-11 MED ORDER — SODIUM CHLORIDE 0.9% FLUSH
3.0000 mL | Freq: Two times a day (BID) | INTRAVENOUS | Status: DC
  Administered 2018-04-11 – 2018-04-26 (×27): 3 mL via INTRAVENOUS

## 2018-04-11 MED ORDER — FUROSEMIDE 10 MG/ML IJ SOLN
40.0000 mg | Freq: Two times a day (BID) | INTRAMUSCULAR | Status: DC
  Administered 2018-04-11 – 2018-04-12 (×4): 40 mg via INTRAVENOUS
  Filled 2018-04-11 (×5): qty 4

## 2018-04-11 NOTE — ED Notes (Signed)
Pure Wick was placed on patient. 

## 2018-04-11 NOTE — ED Notes (Signed)
Patient signed consent form for blood transfusion . 

## 2018-04-11 NOTE — ED Triage Notes (Addendum)
Patient arrived with EMS from [facility] staff reported SOB with chest congestion this evening , emesis x3 today . No fever or chills , mild lower legs edema . EMS applied CPAP.

## 2018-04-11 NOTE — Progress Notes (Signed)
CRITICAL VALUE ALERT  Critical Value:  Troponin 0.05  Date & Time Notied:  04/11/18 at 0215 pm  Provider Notified: Dr. Sarajane Jews  Orders Received/Actions taken: No new orders.

## 2018-04-11 NOTE — ED Notes (Signed)
ED TO INPATIENT HANDOFF REPORT  ED Nurse Name and Phone #: Santiago Glad at [phone]  S Name/Age/Gender Andrea [name] 68 y.o. female Room/Bed: 027C/027C  Code Status   Code Status: DNR  Home/SNF/Other Nursing Home Patient oriented to: self, place, time and situation Is this baseline? Yes   Triage Complete: Triage complete  Chief Complaint cpap  Triage Note Patient arrived with EMS from [facility] staff reported SOB with chest congestion this evening , emesis x3 today . No fever or chills , mild lower legs edema . EMS applied CPAP.    Allergies Allergies  Allergen Reactions  . Ticagrelor Rash  . Aspartame And Phenylalanine Hives and Diarrhea  . Aspirin Nausea And Vomiting  . Maxzide [Hydrochlorothiazide W-Triamterene] Swelling    Fluid retention  . Metformin And Related Diarrhea  . Nsaids Nausea And Vomiting  . Other Other (See Comments)    All steroids produce psychosis per pt Artificial sweeteners produce nausea and upset stomach.  Gregary Cromer [Pravastatin] Other (See Comments)    unknown  . Spironolactone   . Stadol [Butorphanol] Other (See Comments)    Toradol, etc And related- hallucinations  . Toradol [Ketorolac Tromethamine] Other (See Comments)    hallucinations  . Vistaril [Hydroxyzine Hcl] Other (See Comments)    unknown  . Erythromycin Itching and Rash  . Morphine And Related Hives and Rash    Iv morphine.  . Penicillins Hives, Itching and Rash    Has patient had a PCN reaction causing immediate rash, facial/tongue/throat swelling, SOB or lightheadedness with hypotension: yes Has patient had a PCN reaction causing severe rash involving mucus membranes or skin necrosis: unknown Has patient had a PCN reaction that required hospitalization : unknown Has patient had a PCN reaction occurring within the last 10 years: no If all of the above answers are "NO", then may proceed with Cephalosporin use.     Level of Care/Admitting Diagnosis ED  Disposition    ED Disposition Condition Comment   Admit  Hospital Area: [facility] [[id number]]  Level of Care: Progressive [102]  Diagnosis: Acute hypoxemic respiratory failure [facility]) [[id number]]  Admitting Physician: [place], [place]  Attending Physician: Samuella Cota [[id number]]  Estimated length of stay: past midnight tomorrow  Certification:: I certify this patient will need inpatient services for at least 2 midnights  PT Class (Do Not Modify): Inpatient [101]  PT Acc Code (Do Not Modify): Private [1]       B Medical/Surgery History Past Medical History:  Diagnosis Date  . Breast cancer ([place])    bilat mastectomy and LUE lymphadenectomy 2014, chemoRx 2014 - 15  . CHF (congestive heart failure) ([place])   . Diabetes mellitus without complication ([place])   . Esophageal reflux   . Hypertension   . Hypothyroid   . Migraine   . Seizure [facility])    Past Surgical History:  Procedure Laterality Date  . ABDOMINAL HYSTERECTOMY     1984 , done for ruptured cyst and endometriosis  . APPENDECTOMY    . CHOLECYSTECTOMY     1980's  . CORONARY STENT INTERVENTION N/A 12/30/2017   Procedure: CORONARY STENT INTERVENTION;  Surgeon: Martinique, Peter M, MD;  Location: [facility];  Service: Cardiovascular;  Laterality: N/A;  . EYE SURGERY  08/28/2015   Right eye  . KNEE SURGERY Left   . MASTECTOMY     bilateral  . Open surgery for bowel obstruction, 2000's    . RIGHT/LEFT HEART CATH AND CORONARY ANGIOGRAPHY  N/A 12/29/2017   Procedure: RIGHT/LEFT HEART CATH AND CORONARY ANGIOGRAPHY;  Surgeon: Martinique, Peter M, MD;  Location: [facility];  Service: Cardiovascular;  Laterality: N/A;     A IV Location/Drains/Wounds Patient Lines/Drains/Airways Status   Active Line/Drains/Airways    Name:   Placement date:   Placement time:   Site:   Days:   Peripheral IV 04/11/18 Right Hand   04/11/18    -    Hand   less than 1   Peripheral IV 04/11/18 Right;Upper Arm   04/11/18     0411    Arm   less than 1   External Urinary Catheter   04/11/18    0533    -   less than 1          Intake/Output Last 24 hours  Intake/Output Summary (Last 24 hours) at 04/11/2018 0924 Last data filed at 04/11/2018 0920 Gross per 24 hour  Intake 315 ml  Output -  Net 315 ml    Labs/Imaging Results for orders placed or performed during the hospital encounter of 04/11/18 (from the past 48 hour(s))  CBC with Differential     Status: Abnormal   Collection Time: 04/11/18  3:41 AM  Result Value Ref Range   WBC 4.9 4.0 - 10.5 K/uL   RBC 2.36 (L) 3.87 - 5.11 MIL/uL   Hemoglobin 7.0 (L) 12.0 - 15.0 g/dL   HCT 22.7 (L) 36.0 - 46.0 %   MCV 96.2 80.0 - 100.0 fL   MCH 29.7 26.0 - 34.0 pg   MCHC 30.8 30.0 - 36.0 g/dL   RDW 14.0 11.5 - 15.5 %   Platelets 220 150 - 400 K/uL    Comment: REPEATED TO VERIFY   nRBC 0.0 0.0 - 0.2 %   Neutrophils Relative % 83 %   Neutro Abs 4.0 1.7 - 7.7 K/uL   Lymphocytes Relative 10 %   Lymphs Abs 0.5 (L) 0.7 - 4.0 K/uL   Monocytes Relative 6 %   Monocytes Absolute 0.3 0.1 - 1.0 K/uL   Eosinophils Relative 0 %   Eosinophils Absolute 0.0 0.0 - 0.5 K/uL   Basophils Relative 0 %   Basophils Absolute 0.0 0.0 - 0.1 K/uL   Immature Granulocytes 1 %   Abs Immature Granulocytes 0.03 0.00 - 0.07 K/uL    Comment: Performed at [facility], [address] [place], [place] [address]  Comprehensive metabolic panel     Status: Abnormal   Collection Time: 04/11/18  3:41 AM  Result Value Ref Range   Sodium 136 135 - 145 mmol/L   Potassium 5.6 (H) 3.5 - 5.1 mmol/L    Comment: HEMOLYSIS AT THIS LEVEL MAY AFFECT RESULT   Chloride 105 98 - 111 mmol/L   CO2 20 (L) 22 - 32 mmol/L   Glucose, Bld 186 (H) 70 - 99 mg/dL   BUN 36 (H) 8 - 23 mg/dL   Creatinine, Ser 2.13 (H) 0.44 - 1.00 mg/dL   Calcium 8.0 (L) 8.9 - 10.3 mg/dL   Total Protein 5.9 (L) 6.5 - 8.1 g/dL   Albumin 2.3 (L) 3.5 - 5.0 g/dL   AST 39 15 - 41 U/L   ALT 15 0 - 44 U/L   Alkaline Phosphatase  47 38 - 126 U/L   Total Bilirubin 1.0 0.3 - 1.2 mg/dL   GFR calc non Af Amer 24 (L) >60 mL/min   GFR calc Af Amer 27 (L) >60 mL/min   Anion  gap 11 5 - 15    Comment: Performed at [facility], [place] [address], [place], [place] [address]  Lactic acid, plasma     Status: None   Collection Time: 04/11/18  3:41 AM  Result Value Ref Range   Lactic Acid, Venous 0.9 0.5 - 1.9 mmol/L    Comment: Performed at [facility] [address] [place], [place] [address]  Brain natriuretic peptide     Status: Abnormal   Collection Time: 04/11/18  3:41 AM  Result Value Ref Range   B Natriuretic Peptide 1,045.0 (H) 0.0 - 100.0 pg/mL    Comment: Performed at [facility] [address] [place], [place] [address]  Troponin I - ONCE - STAT     Status: None   Collection Time: 04/11/18  3:41 AM  Result Value Ref Range   Troponin I <0.03 <0.03 ng/mL    Comment: Performed at [facility] [address] [place], [place] [address]  POCT I-Stat EG7     Status: Abnormal   Collection Time: 04/11/18  3:50 AM  Result Value Ref Range   pH, Ven 7.507 (H) 7.250 - 7.430   pCO2, Ven 32.5 (L) 44.0 - 60.0 mmHg   pO2, Ven 103.0 (H) 32.0 - 45.0 mmHg   Bicarbonate 25.8 20.0 - 28.0 mmol/L   TCO2 27 22 - 32 mmol/L   O2 Saturation 99.0 %   Acid-Base Excess 3.0 (H) 0.0 - 2.0 mmol/L   Sodium 136 135 - 145 mmol/L   Potassium 5.8 (H) 3.5 - 5.1 mmol/L   Calcium, Ion 1.04 (L) 1.15 - 1.40 mmol/L   HCT 20.0 (L) 36.0 - 46.0 %   Hemoglobin 6.8 (LL) 12.0 - 15.0 g/dL    Comment: REPEATED TO VERIFY SPECIMEN CHECKED FOR CLOTS CRITICAL RESULT CALLED TO, READ BACK BY AND VERIFIED WITHGillis Ends RN [phone] [id number] SHORTT Performed at [facility] [address], [place], [place] [address]    Patient temperature HIDE    Sample type VENOUS    Comment NOTIFIED PHYSICIAN   Respiratory Panel by PCR     Status: Abnormal   Collection Time: 04/11/18  4:16 AM  Result Value Ref Range   Adenovirus NOT  DETECTED NOT DETECTED   Coronavirus 229E NOT DETECTED NOT DETECTED    Comment: (NOTE) The Coronavirus on the Respiratory Panel, DOES NOT test for the novel  Coronavirus (2019 nCoV)    Coronavirus HKU1 NOT DETECTED NOT DETECTED   Coronavirus NL63 NOT DETECTED NOT DETECTED   Coronavirus OC43 NOT DETECTED NOT DETECTED   Metapneumovirus NOT DETECTED NOT DETECTED   Rhinovirus / Enterovirus NOT DETECTED NOT DETECTED   Influenza A NOT DETECTED NOT DETECTED   Influenza B NOT DETECTED NOT DETECTED   Parainfluenza Virus 1 NOT DETECTED NOT DETECTED   Parainfluenza Virus 2 NOT DETECTED NOT DETECTED   Parainfluenza Virus 3 NOT DETECTED NOT DETECTED   Parainfluenza Virus 4 NOT DETECTED NOT DETECTED   Respiratory Syncytial Virus DETECTED (A) NOT DETECTED    Comment: CRITICAL RESULT CALLED TO, READ BACK BY AND VERIFIED WITH: RN K Anterrio Mccleery [id number] MLM    Bordetella pertussis NOT DETECTED NOT DETECTED   Chlamydophila pneumoniae NOT DETECTED NOT DETECTED   Mycoplasma pneumoniae NOT DETECTED NOT DETECTED    Comment: Performed at [facility] [address] [place], [place] [address]  Blood culture (routine x 2)     Status: None (Preliminary result)  Collection Time: 04/11/18  4:18 AM  Result Value Ref Range   Specimen Description BLOOD RIGHT HAND    Special Requests      BOTTLES DRAWN AEROBIC ONLY Blood Culture adequate volume   Culture      NO GROWTH < 12 HOURS Performed at [facility] [address] [place], [place] [address]    Report Status PENDING   Blood culture (routine x 2)     Status: None (Preliminary result)   Collection Time: 04/11/18  4:20 AM  Result Value Ref Range   Specimen Description BLOOD RIGHT ARM    Special Requests      BOTTLES DRAWN AEROBIC AND ANAEROBIC Blood Culture adequate volume   Culture      NO GROWTH < 12 HOURS Performed at [facility], [place] [address] [place], [place] [address]    Report Status PENDING   Prepare RBC     Status: None    Collection Time: 04/11/18  5:54 AM  Result Value Ref Range   Order Confirmation      ORDER PROCESSED BY BLOOD BANK Performed at [facility], [place] [address], [place], [place] [address]   Type and screen [place]     Status: None (Preliminary result)   Collection Time: 04/11/18  5:58 AM  Result Value Ref Range   ABO/RH(D) B POS    Antibody Screen NEG    Sample Expiration 04/14/2018    Unit Number [id number]    Blood Component Type RED CELLS,LR    Unit division 00    Status of Unit ISSUED    Transfusion Status OK TO TRANSFUSE    Crossmatch Result      Compatible Performed at [facility], [place] [address] [place], [place] [address]    Dg Chest Portable 1 View  Result Date: 04/11/2018 CLINICAL DATA:  Shortness of breath. EXAM: PORTABLE CHEST 1 VIEW COMPARISON:  03/17/2018 FINDINGS: Cardiomediastinal silhouette is enlarged. Mediastinal contours appear intact. There is increasing airspace opacity in the right lung base. Interstitial pulmonary edema. Osseous structures are without acute abnormality. Soft tissues are grossly normal. IMPRESSION: 1. Confluent airspace opacity in the right lung base may represent increasing right pleural effusion with compressive atelectasis or infectious airspace consolidation. Possible peribronchial airspace consolidation in the left lower lobe. 2. Enlarged cardiac silhouette. 3. Interstitial pulmonary edema. Electronically Signed   By: Fidela Salisbury M.D.   On: 04/11/2018 04:02    Pending Labs Unresulted Labs (From admission, onward)    Start     Ordered   04/12/18 0500  HIV antibody (Routine Screening)  Tomorrow morning,   R     04/11/18 0918   04/12/18 9528  Basic metabolic panel  Daily,   R     04/11/18 0918   04/12/18 4132  Basic metabolic panel  Tomorrow morning,   R     04/11/18 0918   04/12/18 0500  CBC  Tomorrow morning,   R     04/11/18 0918   04/11/18 4401  Basic metabolic panel  Once,   R      04/11/18 0920   04/11/18 0915  Troponin I - Now Then Q6H  Now then every 6 hours,   R     04/11/18 0918   04/11/18 0908  Culture, blood (routine x 2) Call MD if unable to obtain prior to antibiotics being given  BLOOD CULTURE X 2,   R    Comments:  If blood cultures drawn in Emergency Department - Do not draw and cancel order    04/11/18 0918   04/11/18 0908  Gram stain  Once,   R     04/11/18 0918          Vitals/Pain Today's Vitals   04/11/18 0630 04/11/18 0632 04/11/18 0906 04/11/18 0920  BP: (!) 176/68   (!) 187/71  Pulse: 82   95  Resp: 14   (!) 22  Temp:    98.7 F (37.1 C)  TempSrc:    Oral  SpO2: 100%  97% 95%  Weight:      Height:      PainSc:  0-No pain      Isolation Precautions Droplet precaution  Medications Medications  atorvastatin (LIPITOR) tablet 80 mg (has no administration in time range)  busPIRone (BUSPAR) tablet 15 mg (has no administration in time range)  carbamide peroxide (DEBROX) 6.5 % OTIC (EAR) solution 5 drop (has no administration in time range)  divalproex (DEPAKOTE) DR tablet 1,000 mg (has no administration in time range)  hydrALAZINE (APRESOLINE) tablet 100 mg (has no administration in time range)  insulin glargine (LANTUS) injection 10 Units (has no administration in time range)  isosorbide mononitrate (IMDUR) 24 hr tablet 30 mg (has no administration in time range)  levothyroxine (SYNTHROID, LEVOTHROID) tablet 200 mcg (has no administration in time range)  morphine (MS CONTIN) 12 hr tablet 15 mg (has no administration in time range)  pantoprazole (PROTONIX) EC tablet 40 mg (has no administration in time range)  insulin aspart (novoLOG) injection 0-9 Units (has no administration in time range)  insulin aspart (novoLOG) injection 0-5 Units (has no administration in time range)  furosemide (LASIX) injection 40 mg (has no administration in time range)  heparin injection 5,000 Units (has no administration in time range)  sodium chloride  flush (NS) 0.9 % injection 3 mL (has no administration in time range)  sodium chloride flush (NS) 0.9 % injection 3 mL (has no administration in time range)  sodium chloride flush (NS) 0.9 % injection 3 mL (has no administration in time range)  0.9 %  sodium chloride infusion (has no administration in time range)  acetaminophen (TYLENOL) tablet 650 mg (has no administration in time range)    Or  acetaminophen (TYLENOL) suppository 650 mg (has no administration in time range)  ondansetron (ZOFRAN) tablet 4 mg (has no administration in time range)    Or  ondansetron (ZOFRAN) injection 4 mg (has no administration in time range)  furosemide (LASIX) injection 40 mg (40 mg Intravenous Given 04/11/18 0532)  0.9 %  sodium chloride infusion (10 mL/hr Intravenous New Bag/Given 04/11/18 0546)    Mobility walks with device Low fall risk   Focused Assessments Pulmonary Assessment Handoff:  Lung sounds: Bilateral Breath Sounds: Rhonchi L Breath Sounds: Coarse crackles R Breath Sounds: Coarse crackles O2 Device: Nasal Cannula O2 Flow Rate (L/min): 3 L/min      R Recommendations: See Admitting Provider Note  Report given to:   Additional Notes:  1st unit of blood started at 0915-- pt is currently off bipap, on O2 at 3L/m/[place]-  \has a productive cough for yellow sputum. resp panel is POSITIVE FOR RSV> pt is on droplet precautions

## 2018-04-11 NOTE — ED Provider Notes (Signed)
[facility] EMERGENCY DEPARTMENT Provider Note   CSN: [id number] Arrival date & time: 04/11/18  9242    History   Chief Complaint Chief Complaint  Patient presents with  . Shortness of Breath    CPAP  . Congestive Heart Failure    HPI Andrea [name] is a 68 y.o. female.     HPI 68 year old female with history of severe three-vessel coronary artery disease, ischemic cardiomyopathy, multiple previous hospitalizations, here with shortness of breath.  History is somewhat limited secondary to respiratory distress.  Patient states that over the last several days, she has had general fatigue, shortness of breath, and worsening mild nausea.  She denies any overt abdominal or chest pain.  EMS was called to the facility today and states that would not provide her with much history.  However, she was hypoxic and tachypneic with diffuse rails.  She stated that she felt like she had fluid on her.  She subsequently is placed on BiPAP and brought to the ED.  Remainder of history limited secondary to BiPAP use and as per distress.  Per review of records, patient was just hospitalized on 2/23 through 3/6.  She was admitted for CHF exacerbation, but also this was complicated by mild AKI secondary to poor p.o. intake.  She also had confusion at that time.,  Which was thought secondary to narcotic use for chronic back pain.  Past Medical History:  Diagnosis Date  . Breast cancer ([place])    bilat mastectomy and LUE lymphadenectomy 2014, chemoRx 2014 - 15  . CHF (congestive heart failure) ([place])   . Diabetes mellitus without complication ([place])   . Esophageal reflux   . Hypertension   . Hypothyroid   . Migraine   . Seizure [facility])     Patient Active Problem List   Diagnosis Date Noted  . Encephalopathy 03/18/2018  . Hyperkalemia 03/15/2018  . 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 Chronic systolic heart failure ([place]) 02/12/2018  . Acute  respiratory failure with hypoxia ([place]) 01/20/2018  . Constipation 01/20/2018  . High risk medication use 01/19/2018  . Chest pain, rule out acute myocardial infarction 01/17/2018  . Drug allergy, multiple 01/08/2018  . History of breast cancer 01/08/2018  . Ischemic chest pain ([place]) 01/04/2018  . Chest pain 01/04/2018  . CAD S/P percutaneous coronary angioplasty 12/30/2017  . Unspecified abdominal pain 06/02/2017  . C. difficile colitis 03/17/2017  . AKI (acute kidney injury) ([place])   . Diverticulitis 02/17/2017  . Frequent falls 01/08/2017  . Rhabdomyolysis 01/08/2017  . Intractable nausea and vomiting 11/23/2016  . Insulin dependent diabetes mellitus ([place])   . Multiple rib fractures 11/28/2013  . Diarrhea 10/05/2013  . Dyslipidemia 09/16/2013  . History of seizures 08/04/2013  . Chronic pain 07/01/2013  . Anemia 03/16/2013  . Inadequate pain control 03/15/2013  . Cancer of left breast ([place]) 01/20/2013  . Migraine headache 11/10/2012  . 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 Diabetic peripheral neuropathy associated with type 2 diabetes mellitus ([place]) 04/17/2012  . GAD (generalized anxiety disorder) 05/25/2010  . Persistent insomnia 05/25/2010  . Disorder of kidney and ureter 01/26/2009  . Type 2 diabetes mellitus with hyperglycemia, with long-term current use of insulin ([place]) 11/03/2008  . Essential hypertension 09/21/2008  . Esophageal reflux 08/05/2008  . Seizure disorder ([place]) 04/12/2008  . Hypothyroidism 02/27/2007    Past Surgical History:  Procedure Laterality Date  . ABDOMINAL HYSTERECTOMY  1984 , done for ruptured cyst and endometriosis  . APPENDECTOMY    . CHOLECYSTECTOMY     1980's  . CORONARY STENT INTERVENTION N/A 12/30/2017   Procedure: CORONARY STENT INTERVENTION;  Surgeon: Martinique, Peter M, MD;  Location: [place] CV LAB;  Service: Cardiovascular;  Laterality: N/A;  . EYE SURGERY  08/28/2015   Right eye   . KNEE SURGERY Left   . MASTECTOMY     bilateral  . Open surgery for bowel obstruction, 2000's    . RIGHT/LEFT HEART CATH AND CORONARY ANGIOGRAPHY N/A 12/29/2017   Procedure: RIGHT/LEFT HEART CATH AND CORONARY ANGIOGRAPHY;  Surgeon: Martinique, Peter M, MD;  Location: [place] CV LAB;  Service: Cardiovascular;  Laterality: N/A;     OB History   No obstetric history on file.      Home Medications    Prior to Admission medications   Medication Sig Start Date End Date Taking? Authorizing Provider  acetaminophen (TYLENOL) 325 MG tablet Take 2 tablets (650 mg total) by mouth every 6 (six) hours as needed for mild pain (or Fever >/= 101). 03/27/18   Debbe Odea, MD  Amino Acids-Protein Hydrolys (FEEDING SUPPLEMENT, PRO-STAT SUGAR FREE 64,) LIQD Take 30 mLs by mouth 2 (two) times daily. 03/27/18   Debbe Odea, MD  amLODipine (NORVASC) 5 MG tablet Take 5 mg by mouth daily. 03/09/18   [provider]  atorvastatin (LIPITOR) 80 MG tablet Take 1 tablet (80 mg total) by mouth at bedtime. 01/28/18   Lendon Colonel, NP  busPIRone (BUSPAR) 15 MG tablet Take 15 mg by mouth 2 (two) times daily. 03/09/18 03/09/19  [provider]  carvedilol (COREG) 6.25 MG tablet Take 1 tablet (6.25 mg total) by mouth 2 (two) times daily with a meal. 01/28/18   Lendon Colonel, NP  clopidogrel (PLAVIX) 75 MG tablet Take 1 tablet (75 mg total) by mouth daily. 01/28/18   Lendon Colonel, NP  diclofenac sodium (VOLTAREN) 1 % GEL Apply 4 g topically 4 (four) times daily. Apply to lower back and left knee QID 03/27/18   Debbe Odea, MD  divalproex (DEPAKOTE) 250 MG DR tablet Take 1,000 mg by mouth at bedtime.     [provider]  famotidine (PEPCID) 20 MG tablet Take 1 tablet (20 mg total) by mouth 2 (two) times daily. 03/27/18   Debbe Odea, MD  hydrALAZINE (APRESOLINE) 100 MG tablet Take 1 tablet (100 mg total) by mouth every 8 (eight) hours. 03/07/18   Lavina Hamman, MD  insulin glargine  (LANTUS) 100 UNIT/ML injection Inject 0.1 mLs (10 Units total) into the skin at bedtime. 03/27/18   Debbe Odea, MD  isosorbide mononitrate (IMDUR) 30 MG 24 hr tablet Take 1 tablet (30 mg total) by mouth daily. 03/08/18   Lavina Hamman, MD  levothyroxine (SYNTHROID, LEVOTHROID) 200 MCG tablet Take 1 tablet (200 mcg total) by mouth at bedtime. Patient taking differently: Take 200 mcg by mouth daily before breakfast.  01/12/17   Lavina Hamman, MD  morphine (MS CONTIN) 15 MG 12 hr tablet Take 1 tablet (15 mg total) by mouth 2 (two) times daily. 03/27/18   Debbe Odea, MD  Multiple Vitamin (MULTIVITAMIN WITH MINERALS) TABS tablet Take 1 tablet by mouth daily. 03/27/18   Debbe Odea, MD  polyethylene glycol (MIRALAX / GLYCOLAX) packet Take 17 g by mouth daily as needed for mild constipation. 03/27/18   Debbe Odea, MD  senna-docusate (SENOKOT-S) 8.6-50 MG tablet Take 2 tablets by mouth  2 (two) times daily. 01/22/18   Geradine Girt, DO  Vitamin D, Ergocalciferol, (DRISDOL) 1.25 MG (50000 UT) CAPS capsule Take 50,000 Units by mouth every Tuesday.     [provider]    Family History Family History  Problem Relation Age of Onset  . Sudden Cardiac Death Neg Hx     Social History Social History   Tobacco Use  . Smoking status: Never Smoker  . Smokeless tobacco: Never Used  Substance Use Topics  . Alcohol use: No  . Drug use: No     Allergies   Ticagrelor; Aspartame and phenylalanine; Aspirin; Maxzide [hydrochlorothiazide w-triamterene]; Metformin and related; Nsaids; Other; Pravachol [pravastatin]; Spironolactone; Stadol [butorphanol]; Toradol [ketorolac tromethamine]; Vistaril [hydroxyzine hcl]; Erythromycin; Morphine and related; and Penicillins   Review of Systems Review of Systems  Unable to perform ROS: Acuity of condition  Constitutional: Positive for fatigue.  Respiratory: Positive for shortness of breath.   Gastrointestinal: Positive for nausea.  Neurological:  Positive for weakness.  All other systems reviewed and are negative.    Physical Exam Updated Vital Signs BP (!) 167/76   Pulse 83   Temp 98.6 F (37 C) (Rectal)   Resp 12   Ht 5\' 6"  (1.676 m)   Wt 72.6 kg   SpO2 100%   BMI 25.82 kg/m   Physical Exam Vitals signs and nursing note reviewed.  Constitutional:      General: She is not in acute distress.    Appearance: She is well-developed. She is ill-appearing and diaphoretic.  HENT:     Head: Normocephalic and atraumatic.  Eyes:     Conjunctiva/sclera: Conjunctivae normal.  Neck:     Musculoskeletal: Neck supple.  Cardiovascular:     Rate and Rhythm: Regular rhythm. Tachycardia present.     Heart sounds: Normal heart sounds. No murmur. No friction rub.  Pulmonary:     Effort: Tachypnea and accessory muscle usage present. No respiratory distress.     Breath sounds: Decreased air movement present. Examination of the right-middle field reveals rales. Examination of the left-middle field reveals rales. Examination of the right-lower field reveals rales. Examination of the left-lower field reveals rales. Rales present. No wheezing.  Abdominal:     General: There is no distension.     Palpations: Abdomen is soft.     Tenderness: There is no abdominal tenderness.  Skin:    General: Skin is warm.     Capillary Refill: Capillary refill takes less than 2 seconds.  Neurological:     Mental Status: She is alert and oriented to person, place, and time.     Motor: No abnormal muscle tone.      ED Treatments / Results  Labs (all labs ordered are listed, but only abnormal results are displayed) Labs Reviewed  CBC WITH DIFFERENTIAL/PLATELET - Abnormal; Notable for the following components:      Result Value   RBC 2.36 (*)    Hemoglobin 7.0 (*)    HCT 22.7 (*)    Lymphs Abs 0.5 (*)    All other components within normal limits  COMPREHENSIVE METABOLIC PANEL - Abnormal; Notable for the following components:   Potassium 5.6 (*)     CO2 20 (*)    Glucose, Bld 186 (*)    BUN 36 (*)    Creatinine, Ser 2.13 (*)    Calcium 8.0 (*)    Total Protein 5.9 (*)    Albumin 2.3 (*)    GFR calc non Af Amer 24 (*)  GFR calc Af Amer 27 (*)    All other components within normal limits  BRAIN NATRIURETIC PEPTIDE - Abnormal; Notable for the following components:   B Natriuretic Peptide 1,045.0 (*)    All other components within normal limits  POCT I-STAT EG7 - Abnormal; Notable for the following components:   pH, Ven 7.507 (*)    pCO2, Ven 32.5 (*)    pO2, Ven 103.0 (*)    Acid-Base Excess 3.0 (*)    Potassium 5.8 (*)    Calcium, Ion 1.04 (*)    HCT 20.0 (*)    Hemoglobin 6.8 (*)    All other components within normal limits  RESPIRATORY PANEL BY PCR  CULTURE, BLOOD (ROUTINE X 2)  CULTURE, BLOOD (ROUTINE X 2)  LACTIC ACID, PLASMA  TROPONIN I  PREPARE RBC (CROSSMATCH)  TYPE AND SCREEN    EKG EKG Interpretation  Date/Time:  Saturday April 11 2018 03:18:18 EDT Ventricular Rate:  101 PR Interval:    QRS Duration: 86 QT Interval:  324 QTC Calculation: 420 R Axis:   25 Text Interpretation:  Sinus tachycardia Nonspecific repol abnormality, lateral leads No significant change since last tracing Confirmed by Duffy Bruce [phone]) on 04/11/2018 5:41:51 AM   Radiology Dg Chest Portable 1 View  Result Date: 04/11/2018 CLINICAL DATA:  Shortness of breath. EXAM: PORTABLE CHEST 1 VIEW COMPARISON:  03/17/2018 FINDINGS: Cardiomediastinal silhouette is enlarged. Mediastinal contours appear intact. There is increasing airspace opacity in the right lung base. Interstitial pulmonary edema. Osseous structures are without acute abnormality. Soft tissues are grossly normal. IMPRESSION: 1. Confluent airspace opacity in the right lung base may represent increasing right pleural effusion with compressive atelectasis or infectious airspace consolidation. Possible peribronchial airspace consolidation in the left lower lobe. 2. Enlarged  cardiac silhouette. 3. Interstitial pulmonary edema. Electronically Signed   By: Fidela Salisbury M.D.   On: 04/11/2018 04:02    Procedures .Critical Care Performed by: Duffy Bruce, MD Authorized by: Duffy Bruce, MD   Critical care provider statement:    Critical care time (minutes):  35   Critical care time was exclusive of:  Separately billable procedures and treating other patients and teaching time   Critical care was necessary to treat or prevent imminent or life-threatening deterioration of the following conditions:  Cardiac failure, circulatory failure and respiratory failure   Critical care was time spent personally by me on the following activities:  Development of treatment plan with patient or surrogate, discussions with consultants, evaluation of patient's response to treatment, examination of patient, obtaining history from patient or surrogate, ordering and performing treatments and interventions, ordering and review of laboratory studies, ordering and review of radiographic studies, pulse oximetry, re-evaluation of patient's condition and review of old charts   I assumed direction of critical care for this patient from another provider in my specialty: no     (including critical care time)  Medications Ordered in ED Medications  0.9 %  sodium chloride infusion (has no administration in time range)  furosemide (LASIX) injection 40 mg (40 mg Intravenous Given 04/11/18 0532)     Initial Impression / Assessment and Plan / ED Course  I have reviewed the triage vital signs and the nursing notes.  Pertinent labs & imaging results that were available during my care of the patient were reviewed by me and considered in my medical decision making (see chart for details).        68 year old female with past medical history as above including ischemic cardiomyopathy,  coronary disease, here with shortness of breath.  Patient tachypneic with diffuse rales on exam.   Clinically, concern for decompensated CHF and lab work supports this.  Patient also anemic with hemoglobin of 7.  This appears to be a chronic anemia, though slightly worsened.  No evidence of active bleed at this time.  Given her diffuse coronary disease, feel she would benefit from transfusion in addition to gentle diuresis and BiPAP.  Patient updated and in agreement.  She has no leukocytosis, lactic acidosis, or evidence to suggest superimposed infection.  She has not had any fever and I believe she is low risk for coronavirus infection at this time.  Final Clinical Impressions(s) / ED Diagnoses   Final diagnoses:  Acute on chronic systolic congestive heart failure ([place])  Symptomatic anemia    ED Discharge Orders    None       Duffy Bruce, MD 04/11/18 [phone]

## 2018-04-11 NOTE — Plan of Care (Signed)

## 2018-04-11 NOTE — Progress Notes (Signed)
RT note: Removed patient from Bipap and placed on 3L nasal canula. Vital signs stable at this time. RN aware.

## 2018-04-11 NOTE — H&P (Signed)
History and Physical  Andrea [name] CXK:[id number] DOB: [birth year] DOA: 04/11/2018  PCP: Medicine, [facility]   Chief Complaint: short of breath  HPI:  56yow from [facility] diastolic CHF, 3VCAD, ischemic cardiomyopathy presented via EMS with resp distress, hypoxia tachypnea, rales, "fluid on her". Treated with BiPAP and then weaned to 3L [place]  Reports leg edema for more than a week. Sudden SOB last night with severe chest pressure, no specific a/a factors. No diaphoresis. Had vomiting, no diarrhea. No fever or infectious symptoms. Treated with Lasix alone in ED with substantial improvement. RSV positive.  I called [facility] for more hx. Went through phone tree. No answer.  Hospitalized 2/23 - 3/6 for CHF, AKI, confusion from narcotics  COVID SCREEN Fever: NO  Cough: YES   SOB: YES URI symptoms: NO GI symptoms: YES vomiting Travel: NO--in NH 2 weeks and prior was in hospital. No one traveled to her [place] contacts: NO  ED Course: Lasix and BiPAP   Review of Systems:  Negative for fever, visual changes, sore throat, rash, new muscle aches, dysuria, bleeding, abdominal pain, diarrhea  Past Medical History:  Diagnosis Date  . Breast cancer ([place])    bilat mastectomy and LUE lymphadenectomy 2014, chemoRx 2014 - 15  . CHF (congestive heart failure) ([place])   . Diabetes mellitus without complication ([place])   . Esophageal reflux   . Hypertension   . Hypothyroid   . Migraine   . Seizure [facility])     Past Surgical History:  Procedure Laterality Date  . ABDOMINAL HYSTERECTOMY     1984 , done for ruptured cyst and endometriosis  . APPENDECTOMY    . CHOLECYSTECTOMY     1980's  . CORONARY STENT INTERVENTION N/A 12/30/2017   Procedure: CORONARY STENT INTERVENTION;  Surgeon: Martinique, Peter M, MD;  Location: [place] [facility];  Service: Cardiovascular;  Laterality: N/A;  . EYE SURGERY  08/28/2015   Right eye  . KNEE SURGERY Left   . MASTECTOMY     bilateral  . Open surgery for bowel obstruction, 2000's    . RIGHT/LEFT HEART CATH AND CORONARY ANGIOGRAPHY N/A 12/29/2017   Procedure: RIGHT/LEFT HEART CATH AND CORONARY ANGIOGRAPHY;  Surgeon: Martinique, Peter M, MD;  Location: [place] [facility];  Service: Cardiovascular;  Laterality: N/A;     reports that she has never smoked. She has never used smokeless tobacco. She reports that she does not drink alcohol or use drugs. Mobility: walker  Allergies  Allergen Reactions  . Ticagrelor Rash  . Aspartame And Phenylalanine Hives and Diarrhea  . Aspirin Nausea And Vomiting  . Maxzide [Hydrochlorothiazide W-Triamterene] Swelling    Fluid retention  . Metformin And Related Diarrhea  . Nsaids Nausea And Vomiting  . Other Other (See Comments)    All steroids produce psychosis per pt Artificial sweeteners produce nausea and upset stomach.  Gregary Cromer [Pravastatin] Other (See Comments)    unknown  . Spironolactone   . Stadol [Butorphanol] Other (See Comments)    Toradol, etc And related- hallucinations  . Toradol [Ketorolac Tromethamine] Other (See Comments)    hallucinations  . Vistaril [Hydroxyzine Hcl] Other (See Comments)    unknown  . Erythromycin Itching and Rash  . Morphine And Related Hives and Rash    Iv morphine.  . Penicillins Hives, Itching and Rash    Has patient had a PCN reaction causing immediate rash, facial/tongue/throat swelling, SOB or lightheadedness with hypotension: yes Has patient had a PCN reaction causing severe  rash involving mucus membranes or skin necrosis: unknown Has patient had a PCN reaction that required hospitalization : unknown Has patient had a PCN reaction occurring within the last 10 years: no If all of the above answers are "NO", then may proceed with Cephalosporin use.     Family History  Problem Relation Age of Onset  . Sudden Cardiac Death Neg Hx      Prior to Admission medications   Medication Sig Start Date End Date Taking? Authorizing  Provider  amLODipine (NORVASC) 5 MG tablet Take 5 mg by mouth daily. 03/09/18  Yes [provider]  atorvastatin (LIPITOR) 80 MG tablet Take 1 tablet (80 mg total) by mouth at bedtime. 01/28/18  Yes Lendon Colonel, NP  busPIRone (BUSPAR) 15 MG tablet Take 15 mg by mouth 2 (two) times daily. 03/09/18 03/09/19 Yes [provider]  carbamide peroxide (DEBROX) 6.5 % OTIC solution Place 5 drops into the right ear 2 (two) times daily.   Yes [provider]  carvedilol (COREG) 6.25 MG tablet Take 1 tablet (6.25 mg total) by mouth 2 (two) times daily with a meal. 01/28/18  Yes Lendon Colonel, NP  ciprofloxacin (CIPRO) 500 MG tablet Take 500 mg by mouth 2 (two) times daily.   Yes [provider]  diclofenac sodium (VOLTAREN) 1 % GEL Apply 4 g topically 4 (four) times daily. Apply to lower back and left knee QID 03/27/18  Yes Rizwan, Eunice Blase, MD  divalproex (DEPAKOTE) 500 MG DR tablet Take 1,000 mg by mouth daily.   Yes [provider]  furosemide (LASIX) 20 MG tablet Take 20 mg by mouth daily.   Yes [provider]  hydrALAZINE (APRESOLINE) 100 MG tablet Take 1 tablet (100 mg total) by mouth every 8 (eight) hours. 03/07/18  Yes Lavina Hamman, MD  insulin glargine (LANTUS) 100 UNIT/ML injection Inject 0.1 mLs (10 Units total) into the skin at bedtime. 03/27/18  Yes Debbe Odea, MD  isosorbide mononitrate (IMDUR) 30 MG 24 hr tablet Take 1 tablet (30 mg total) by mouth daily. 03/08/18  Yes Lavina Hamman, MD  levothyroxine (SYNTHROID, LEVOTHROID) 200 MCG tablet Take 1 tablet (200 mcg total) by mouth at bedtime. Patient taking differently: Take 200 mcg by mouth daily before breakfast.  01/12/17  Yes Lavina Hamman, MD  Melatonin 5 MG TABS Take 5 mg by mouth at bedtime.   Yes [provider]  menthol-cetylpyridinium (CEPACOL) 3 MG lozenge Take 1 lozenge by mouth 4 (four) times daily.   Yes [provider]  morphine (MS CONTIN) 15 MG 12 hr  tablet Take 1 tablet (15 mg total) by mouth 2 (two) times daily. 03/27/18  Yes Debbe Odea, MD  Multiple Vitamin (MULTIVITAMIN WITH MINERALS) TABS tablet Take 1 tablet by mouth daily. 03/27/18  Yes Debbe Odea, MD  omeprazole (PRILOSEC) 20 MG capsule Take 20 mg by mouth daily.   Yes [provider]  promethazine (PHENERGAN) 25 MG/ML injection Inject 12.5 mg into the vein every 6 (six) hours as needed for nausea or vomiting.   Yes [provider]  senna-docusate (SENOKOT-S) 8.6-50 MG tablet Take 2 tablets by mouth 2 (two) times daily. 01/22/18  Yes Geradine Girt, DO  acetaminophen (TYLENOL) 325 MG tablet Take 2 tablets (650 mg total) by mouth every 6 (six) hours as needed for mild pain (or Fever >/= 101). Patient not taking: Reported on 04/11/2018 03/27/18   Debbe Odea, MD  Amino Acids-Protein Hydrolys (FEEDING SUPPLEMENT, PRO-STAT SUGAR  FREE 64,) LIQD Take 30 mLs by mouth 2 (two) times daily. Patient not taking: Reported on 04/11/2018 03/27/18   Debbe Odea, MD  clopidogrel (PLAVIX) 75 MG tablet Take 1 tablet (75 mg total) by mouth daily. Patient not taking: Reported on 04/11/2018 01/28/18   Lendon Colonel, NP  famotidine (PEPCID) 20 MG tablet Take 1 tablet (20 mg total) by mouth 2 (two) times daily. Patient not taking: Reported on 04/11/2018 03/27/18   Debbe Odea, MD  polyethylene glycol (MIRALAX / GLYCOLAX) packet Take 17 g by mouth daily as needed for mild constipation. Patient not taking: Reported on 04/11/2018 03/27/18   Debbe Odea, MD    Physical Exam: Vitals:   04/11/18 0530 04/11/18 0630  BP: (!) 167/76 (!) 176/68  Pulse: 83 82  Resp: 12 14  Temp:    SpO2: 100% 100%    Constitutional:   . Appears calm, mildly uncomfortable on 3LNC Eyes:  . pupils and irises appear normal . Normal lids  ENMT:  . grossly normal hearing  . Lips appear normal Neck:  . neck appears normal . no thyromegaly Respiratory:  . CTA rales L>R, no w/r . Respiratory effort mild  increase but can speak in short sentences Cardiovascular:  . RRR, no m/r/g . 2+ LE extremity edema   Abdomen:  . no tenderness or masses . No hepatomegaly Musculoskeletal:  . Digits/nails BUE: no clubbing, cyanosis, petechiae, infection . RUE, LUE o strength and tone normal, no atrophy, no abnormal movements o No tenderness, masses Skin:  . No rashes, lesions, ulcers . palpation of skin: no induration or nodules Psychiatric:  . Mental status o Mood, affect appropriate . judgment and insight appear intact    I have personally reviewed following labs and imaging studies  Labs:  Flu: negative RVP: RSV CBC: leukopenia, lymphopenia NO BMP: increased BUN/Cr NO LFTs: increased AST/ALT/Tbili NO hGB 6.8 K+ 5.6 ckd STABLE BNP 1045 Troponin and lactic acid negative  Imaging studies:   CXR my read asymmetric pulm edema, right pleural effusion  Medical tests:   EKG independently reviewed: ST no acute changes     Principal Problem:   Acute hypoxemic respiratory failure (HCC) Active Problems:   Chronic pain   Chest pain   Acute respiratory failure with hypoxia (HCC)   Anemia   Acute on chronic diastolic CHF (congestive heart failure) (HCC)   RSV (respiratory syncytial virus pneumonia)   DM type 2 (diabetes mellitus, type 2) (HCC)   CKD (chronic kidney disease), stage IV (HCC)   Assessment/Plan Acute hypox resp failure secondary to a/c CHF and RSV pneumonia --BiPAP in ED but already weaned to 3L [place] --improved with Lasix alone suggesting primary CHF; supported by hx --plan as below  A/c diastolic CHF complicated by a/c anemia and RSV --normal WBC, lactate  --IV diuresis --echo on file, nl LVEF; presumed diastolic CHF  CP, resolved --suspect sec to pulm edema --trend troponin. EKG nonacute, doubt ACS --intolerant to ASA  A/c anemia --etiology unclear, suspect CKD + anemia of acute illness/chronic illness --already t/c for 1 unit --no bleeding, check CBC in AM  --outpt workup  RSV pneumonia --supportive care --CXR in AM to f/u diuresis   DM type 2 with CKD stage IV --both stable  --SSI  CKD stage IV with mild hyperkalemia --Lasix, follow as mild --BMP in AM  HTN essential --stable  Hypothyroid --stable, continue replacement therapy  Seizure --none in 10 years. Continue Depakote.  COVID likelihood: LOW.  Physician PPE: gloves,  surgical mask, face shield; rec droplet on future visits Patient PPE: none    Severity of Illness: The appropriate patient status for this patient is INPATIENT. Inpatient status is judged to be reasonable and necessary in order to provide the required intensity of service to ensure the patient's safety. The patient's presenting symptoms, physical exam findings, and initial radiographic and laboratory data in the context of their chronic comorbidities is felt to place them at high risk for further clinical deterioration. Furthermore, it is not anticipated that the patient will be medically stable for discharge from the hospital within 2 midnights of admission. The following factors support the patient status of inpatient.   " The patient's presenting symptoms include SOB. " The worrisome physical exam findings include rales, dyspnea. " The initial radiographic and laboratory data are worrisome because of penumonia, volume overload. " The chronic co-morbidities include DM, CKD stage IV.   * I certify that at the point of admission it is my clinical judgment that the patient will require inpatient hospital care spanning beyond 2 midnights from the point of admission due to high intensity of service, high risk for further deterioration and high frequency of surveillance required.*     DVT prophylaxis: SCDs Code Status: DNR Family Communication: none Consults called: none    Time spent: 60 minutes  Murray Hodgkins, MD  [facility] Direct contact: see [email or web address]  7PM-7AM contact night coverage as  below   1. Check the care team in [facility] and look for a) attending/consulting [facility] provider listed and b) the [facility] team listed 2. Log into [email or web address] and use [place]'s universal password to access. If you do not have the password, please contact the hospital operator. 3. Locate the [facility] provider you are looking for under [facility] and page to a number that you can be directly reached. 4. If you still have difficulty reaching the provider, please page the [facility] (Director on Call) for the Hospitalists listed on amion for assistance.   04/11/2018, 8:08 AM

## 2018-04-11 NOTE — Progress Notes (Signed)
Pt requesting break from BIPAP to eat and take meds.  Pt taken off BIPAP and placed on 6 lpm [place].  Pt is very anxious and agitated.  SPO2 fluctuating 89-94%.  Pt is not tolerating being off NIV, placed back on BIPAP at 8:19 pm.  SPO2 maintaining at 94-95%.  Will cont to monitor.

## 2018-04-12 ENCOUNTER — Inpatient Hospital Stay (HOSPITAL_COMMUNITY): Payer: Medicare Other

## 2018-04-12 LAB — CBC
HCT: 29 % — ABNORMAL LOW (ref 36.0–46.0)
HEMATOCRIT: 26.7 % — AB (ref 36.0–46.0)
HEMOGLOBIN: 9.6 g/dL — AB (ref 12.0–15.0)
Hemoglobin: 8.7 g/dL — ABNORMAL LOW (ref 12.0–15.0)
MCH: 29.6 pg (ref 26.0–34.0)
MCH: 30.6 pg (ref 26.0–34.0)
MCHC: 32.6 g/dL (ref 30.0–36.0)
MCHC: 33.1 g/dL (ref 30.0–36.0)
MCV: 90.8 fL (ref 80.0–100.0)
MCV: 92.4 fL (ref 80.0–100.0)
NRBC: 0 % (ref 0.0–0.2)
Platelets: 204 10*3/uL (ref 150–400)
Platelets: 223 10*3/uL (ref 150–400)
RBC: 2.94 MIL/uL — ABNORMAL LOW (ref 3.87–5.11)
RBC: 3.14 MIL/uL — ABNORMAL LOW (ref 3.87–5.11)
RDW: 14.8 % (ref 11.5–15.5)
RDW: 15.2 % (ref 11.5–15.5)
WBC: 3.8 10*3/uL — ABNORMAL LOW (ref 4.0–10.5)
WBC: 4.1 10*3/uL (ref 4.0–10.5)
nRBC: 0 % (ref 0.0–0.2)

## 2018-04-12 LAB — BPAM RBC
Blood Product Expiration Date: 202004092359
ISSUE DATE / TIME: 202003210905
Unit Type and Rh: 7300

## 2018-04-12 LAB — BASIC METABOLIC PANEL
ANION GAP: 10 (ref 5–15)
BUN: 36 mg/dL — ABNORMAL HIGH (ref 8–23)
CO2: 23 mmol/L (ref 22–32)
Calcium: 8.2 mg/dL — ABNORMAL LOW (ref 8.9–10.3)
Chloride: 107 mmol/L (ref 98–111)
Creatinine, Ser: 1.86 mg/dL — ABNORMAL HIGH (ref 0.44–1.00)
GFR calc non Af Amer: 28 mL/min — ABNORMAL LOW (ref >60)
GFR, EST AFRICAN AMERICAN: 32 mL/min — AB (ref >60)
Glucose, Bld: 88 mg/dL (ref 70–99)
Potassium: 5.3 mmol/L — ABNORMAL HIGH (ref 3.5–5.1)
Sodium: 140 mmol/L (ref 135–145)

## 2018-04-12 LAB — TYPE AND SCREEN
ABO/RH(D): B POS
Antibody Screen: NEGATIVE
Unit division: 0

## 2018-04-12 LAB — GLUCOSE, CAPILLARY
GLUCOSE-CAPILLARY: 83 mg/dL (ref 70–99)
GLUCOSE-CAPILLARY: 75 mg/dL (ref 70–99)
Glucose-Capillary: 86 mg/dL (ref 70–99)
Glucose-Capillary: 97 mg/dL (ref 70–99)
Glucose-Capillary: 84 mg/dL (ref 70–99)

## 2018-04-12 LAB — HIV ANTIBODY (ROUTINE TESTING W REFLEX): HIV Screen 4th Generation wRfx: NONREACTIVE

## 2018-04-12 MED ORDER — METOPROLOL TARTRATE 5 MG/5ML IV SOLN
5.0000 mg | INTRAVENOUS | Status: AC | PRN
  Administered 2018-04-12 (×2): 5 mg via INTRAVENOUS
  Filled 2018-04-12 (×2): qty 5

## 2018-04-12 MED ORDER — PIPERACILLIN-TAZOBACTAM 3.375 G IVPB
3.3750 g | Freq: Three times a day (TID) | INTRAVENOUS | Status: AC
  Administered 2018-04-12 – 2018-04-16 (×14): 3.375 g via INTRAVENOUS
  Filled 2018-04-12 (×14): qty 50

## 2018-04-12 MED ORDER — METOPROLOL TARTRATE 5 MG/5ML IV SOLN
5.0000 mg | INTRAVENOUS | Status: AC | PRN
  Administered 2018-04-12 – 2018-04-17 (×2): 5 mg via INTRAVENOUS
  Filled 2018-04-12 (×2): qty 5

## 2018-04-12 NOTE — Progress Notes (Signed)
Pharmacy Antibiotic Note  Andrea [name] is a 68 y.o. female admitted on 04/11/2018 with pneumonia.  Pharmacy has been consulted for zosyn dosing.  Plan: Zosyn 3.375g IV q8h (4 hour infusion).  Height: 5\' 6"  (167.6 cm) Weight: 160 lb (72.6 kg) IBW/kg (Calculated) : 59.3  Temp (24hrs), Avg:98.6 F (37 C), Min:97.7 F (36.5 C), Max:99.2 F (37.3 C)  Recent Labs  Lab 04/11/18 0341 04/11/18 1253 04/11/18 1840 04/12/18 0306  WBC 4.9  --  3.8*  --   CREATININE 2.13* 2.04*  --  1.86*  LATICACIDVEN 0.9  --   --   --     Estimated Creatinine Clearance: 30.3 mL/min (A) (by C-G formula based on SCr of 1.86 mg/dL (H)).    Allergies  Allergen Reactions  . Ticagrelor Rash  . Aspartame And Phenylalanine Hives and Diarrhea  . Aspirin Nausea And Vomiting  . Maxzide [Hydrochlorothiazide W-Triamterene] Swelling    Fluid retention  . Metformin And Related Diarrhea  . Nsaids Nausea And Vomiting  . Other Other (See Comments)    All steroids produce psychosis per pt Artificial sweeteners produce nausea and upset stomach.  Gregary Cromer [Pravastatin] Other (See Comments)    unknown  . Spironolactone   . Stadol [Butorphanol] Other (See Comments)    Toradol, etc And related- hallucinations  . Toradol [Ketorolac Tromethamine] Other (See Comments)    hallucinations  . Vistaril [Hydroxyzine Hcl] Other (See Comments)    unknown  . Erythromycin Itching and Rash  . Morphine And Related Hives and Rash    Iv morphine.  . Penicillins Hives, Itching and Rash    Has patient had a PCN reaction causing immediate rash, facial/tongue/throat swelling, SOB or lightheadedness with hypotension: yes Has patient had a PCN reaction causing severe rash involving mucus membranes or skin necrosis: unknown Has patient had a PCN reaction that required hospitalization : unknown Has patient had a PCN reaction occurring within the last 10 years: no If all of the above answers are "NO", then may proceed with  Cephalosporin use.     Thank you for allowing pharmacy to be a part of this patient's care.  Excell Seltzer [place]et 04/12/2018 5:49 AM

## 2018-04-12 NOTE — Progress Notes (Signed)
PROGRESS NOTE  Andrea [name] VWP:[id number] DOB: 12-20-1950 DOA: 04/11/2018 PCP: Medicine, [facility]  HPI/Recap of past 24 hours: 32yow from [facility] PMH diastolic CHF, 3VCAD, ischemic cardiomyopathy presented via EMS with resp distress, hypoxia tachypnea, rales, "fluid on her". Treated with BiPAP and then weaned to 3L [place]  Reports leg edema for more than a week. Sudden SOB last night with severe chest pressure, no specific a/a factors. No diaphoresis. Had vomiting, no diarrhea. No fever or infectious symptoms. Treated with Lasix alone in ED with substantial improvement. RSV positive.  I called [facility] for more hx. Went through phone tree. No answer.  Hospitalized 2/23 - 3/6 for CHF, AKI, confusion from narcotics  COVID SCREEN Fever: NO  Cough: YES   SOB: YES URI symptoms: NO GI symptoms: YES vomiting Travel: NO--in NH 2 weeks and prior was in hospital. No one traveled to her [place] contacts: NO  ED Course: Lasix and BiPAP   04/12/18: Seen and examined at bedside.  She was on BiPAP.  Admits to persistent cough.  She denies chest pain.  Appears comfortable on BiPAP.   Assessment/Plan: Principal Problem:   Acute hypoxemic respiratory failure (HCC) Active Problems:   Chronic pain   Chest pain   Acute respiratory failure with hypoxia (HCC)   Anemia   Acute on chronic diastolic CHF (congestive heart failure) (HCC)   RSV (respiratory syncytial virus pneumonia)   DM type 2 (diabetes mellitus, type 2) (HCC)   CKD (chronic kidney disease), stage IV (HCC)  Acute hypoxic respiratory failure multifactorial secondary to RSV pneumonia versus acute on chronic diastolic CHF Presented with hypoxia, tachycardia, tachypnea placed on BiPAP Independent review chest x-ray done on admission which revealed bilateral pulmonary infiltrates suggestive of multilobar goal pneumonia Respiratory panel positive for RSV Continue IV antibiotics Zosyn day #1 Obtain  sputum culture Blood cultures x2 no growth x1 day Maintain O2 saturation greater than 92% Continuous pulse oximetry N.p.o. while on BiPAP Wean off BiPAP as tolerated  Acute on chronic diastolic CHF Presented with BNP of 2045 Increase in pulmonary vascularity on chest x-ray done in admission Continue Lasix continue to monitor urine output Troponin flat peaked at 0.06  Uncontrolled hypertension Continue home antihypertensive medication IV hydralazine PRN  Hyperkalemia Presented with potassium 4.9 Potassium today 5.3 on 04/12/2018 Continue to monitor Repeat BMP in the morning  CKD 3 Presented with creatinine of 2.04 Creatinine today 1.86 Avoid nephrotoxic agents/hypotension/dehydration Repeat BMP in the morning Monitor urine output  Physical debility PT OT to assess Fall precautions    DVT prophylaxis: SCDs Code Status: DNR Family Communication: none Consults called: none    Disposition: Possible discharge to SNF in 2 to 3 days when oxygen requirement is back to baseline or close to baseline.    Objective: Vitals:   04/11/18 2311 04/12/18 0240 04/12/18 0359 04/12/18 0847  BP:   (!) 189/97 (!) 138/108  Pulse: (!) 105 (!) 116 95 96  Resp: (!) 27 (!) 28  (!) 25  Temp:   99.2 F (37.3 C) 98.9 F (37.2 C)  TempSrc:   Axillary Axillary  SpO2: 93% 95% 95% 98%  Weight:      Height:        Intake/Output Summary (Last 24 hours) at 04/12/2018 1104 Last data filed at 04/12/2018 0950 Gross per 24 hour  Intake 390 ml  Output 1300 ml  Net -910 ml   Filed Weights   04/11/18 0318  Weight: 72.6 kg    Exam:  General: 68 y.o. year-old female well developed well nourished in no acute distress.  Alert and interactive.  On BiPAP.  Cardiovascular: Regular rate and rhythm with no rubs or gallops.  No thyromegaly or JVD noted.    Respiratory: Mild rales at bases.  No wheezes noted.  Abdomen: Soft nontender nondistended with normal bowel sounds x4  quadrants.  Musculoskeletal: Trace lower extremity edema. 2/4 pulses in all 4 extremities.  Psychiatry: Mood is appropriate for condition and setting   Data Reviewed: CBC: Recent Labs  Lab 04/11/18 0341 04/11/18 0350 04/11/18 1840 04/12/18 0609  WBC 4.9  --  3.8* 4.1  NEUTROABS 4.0  --   --   --   HGB 7.0* 6.8* 8.7* 9.6*  HCT 22.7* 20.0* 26.7* 29.0*  MCV 96.2  --  90.8 92.4  PLT 220  --  204 448   Basic Metabolic Panel: Recent Labs  Lab 04/11/18 0341 04/11/18 0350 04/11/18 1253 04/12/18 0306  NA 136 136 137 140  K 5.6* 5.8* 4.9 5.3*  CL 105  --  104 107  CO2 20*  --  23 23  GLUCOSE 186*  --  148* 88  BUN 36*  --  35* 36*  CREATININE 2.13*  --  2.04* 1.86*  CALCIUM 8.0*  --  8.2* 8.2*   GFR: Estimated Creatinine Clearance: 30.3 mL/min (A) (by C-G formula based on SCr of 1.86 mg/dL (H)). Liver Function Tests: Recent Labs  Lab 04/11/18 0341  AST 39  ALT 15  ALKPHOS 47  BILITOT 1.0  PROT 5.9*  ALBUMIN 2.3*   No results for input(s): LIPASE, AMYLASE in the last 168 hours. No results for input(s): AMMONIA in the last 168 hours. Coagulation Profile: No results for input(s): INR, PROTIME in the last 168 hours. Cardiac Enzymes: Recent Labs  Lab 04/11/18 0341 04/11/18 1253 04/11/18 1840  TROPONINI <0.03 0.05* 0.06*   BNP (last 3 results) No results for input(s): PROBNP in the last 8760 hours. HbA1C: No results for input(s): HGBA1C in the last 72 hours. CBG: Recent Labs  Lab 04/11/18 1208 04/11/18 1653 04/11/18 2110 04/12/18 0411 04/12/18 0846  GLUCAP 136* 100* 98 86 97   Lipid Profile: No results for input(s): CHOL, HDL, LDLCALC, TRIG, CHOLHDL, LDLDIRECT in the last 72 hours. Thyroid Function Tests: No results for input(s): TSH, T4TOTAL, FREET4, T3FREE, THYROIDAB in the last 72 hours. Anemia Panel: No results for input(s): VITAMINB12, FOLATE, FERRITIN, TIBC, IRON, RETICCTPCT in the last 72 hours. Urine analysis:    Component Value Date/Time    COLORURINE YELLOW 02/25/2018 2316   APPEARANCEUR HAZY (A) 02/25/2018 2316   LABSPEC 1.014 02/25/2018 2316   PHURINE 5.0 02/25/2018 2316   GLUCOSEU NEGATIVE 02/25/2018 2316   HGBUR SMALL (A) 02/25/2018 2316   BILIRUBINUR NEGATIVE 02/25/2018 2316   KETONESUR NEGATIVE 02/25/2018 2316   PROTEINUR >=300 (A) 02/25/2018 2316   UROBILINOGEN 1.0 12/02/2013 1612   NITRITE NEGATIVE 02/25/2018 2316   LEUKOCYTESUR SMALL (A) 02/25/2018 2316   Sepsis Labs: @LABRCNTIP (procalcitonin:4,lacticidven:4)  ) Recent Results (from the past 240 hour(s))  Respiratory Panel by PCR     Status: Abnormal   Collection Time: 04/11/18  4:16 AM  Result Value Ref Range Status   Adenovirus NOT DETECTED NOT DETECTED Final   Coronavirus 229E NOT DETECTED NOT DETECTED Final    Comment: (NOTE) The Coronavirus on the Respiratory Panel, DOES NOT test for the novel  Coronavirus (2019 nCoV)    Coronavirus HKU1 NOT DETECTED NOT DETECTED Final   Coronavirus  NL63 NOT DETECTED NOT DETECTED Final   Coronavirus OC43 NOT DETECTED NOT DETECTED Final   Metapneumovirus NOT DETECTED NOT DETECTED Final   Rhinovirus / Enterovirus NOT DETECTED NOT DETECTED Final   Influenza A NOT DETECTED NOT DETECTED Final   Influenza B NOT DETECTED NOT DETECTED Final   Parainfluenza Virus 1 NOT DETECTED NOT DETECTED Final   Parainfluenza Virus 2 NOT DETECTED NOT DETECTED Final   Parainfluenza Virus 3 NOT DETECTED NOT DETECTED Final   Parainfluenza Virus 4 NOT DETECTED NOT DETECTED Final   Respiratory Syncytial Virus DETECTED (A) NOT DETECTED Final    Comment: CRITICAL RESULT CALLED TO, READ BACK BY AND VERIFIED WITH: RN K COBB [id number] MLM    Bordetella pertussis NOT DETECTED NOT DETECTED Final   Chlamydophila pneumoniae NOT DETECTED NOT DETECTED Final   Mycoplasma pneumoniae NOT DETECTED NOT DETECTED Final    Comment: Performed at [facility], [place] [address], [place], [place] [address]  Blood culture (routine x 2)     Status:  None (Preliminary result)   Collection Time: 04/11/18  4:18 AM  Result Value Ref Range Status   Specimen Description BLOOD RIGHT HAND  Final   Special Requests   Final    BOTTLES DRAWN AEROBIC ONLY Blood Culture adequate volume   Culture   Final    NO GROWTH 1 DAY Performed at [facility], [place] [address], [place], [place] [address]    Report Status PENDING  Incomplete  Blood culture (routine x 2)     Status: None (Preliminary result)   Collection Time: 04/11/18  4:20 AM  Result Value Ref Range Status   Specimen Description BLOOD RIGHT ARM  Final   Special Requests   Final    BOTTLES DRAWN AEROBIC AND ANAEROBIC Blood Culture adequate volume   Culture   Final    NO GROWTH 1 DAY Performed at [facility], [place] [address] [place], [place] [address]    Report Status PENDING  Incomplete      Studies: Dg Chest Port 1 View  Result Date: 04/12/2018 CLINICAL DATA:  67 year old female with history of severe shortness of breath. EXAM: PORTABLE CHEST 1 VIEW COMPARISON:  Multiple priors, most recently chest x-ray 04/11/2018. FINDINGS: Increasing confluent patchy airspace opacities are noted in the lungs bilaterally, most severe at the lung bases bilaterally, and in the left suprahilar region. Findings are very asymmetrically distributed, and not compatible with pulmonary edema. There is a diffuse interstitial prominence also noted, which could be indicative of a background of mild interstitial pulmonary edema. Moderate right and small left pleural effusions. Mild cardiomegaly. Upper mediastinal contours are within normal limits. IMPRESSION: 1. Although there may be a background of pulmonary edema related to congestive heart failure, there is now clear evidence of multifocal asymmetrically distributed airspace consolidation which has significantly worsened compared to prior examinations, highly concerning for developing multilobar pneumonia. 2. Moderate right and small left pleural  effusions. Electronically Signed   By: Vinnie Langton M.D.   On: 04/12/2018 04:27    Scheduled Meds:  atorvastatin  80 mg Oral QHS   busPIRone  15 mg Oral BID   carbamide peroxide  5 drop Right EAR BID   divalproex  1,000 mg Oral Daily   furosemide  40 mg Intravenous Q12H   hydrALAZINE  100 mg Oral Q8H   insulin aspart  0-5 Units Subcutaneous QHS   insulin aspart  0-9 Units Subcutaneous TID WC   insulin glargine  10 Units Subcutaneous QHS   isosorbide mononitrate  30 mg Oral Daily   levothyroxine  200 mcg Oral Daily   morphine  15 mg Oral BID   pantoprazole  40 mg Oral Daily   sodium chloride flush  3 mL Intravenous Q12H   sodium chloride flush  3 mL Intravenous Q12H    Continuous Infusions:  sodium chloride     piperacillin-tazobactam (ZOSYN)  IV 3.375 g (04/12/18 0610)     LOS: 1 day     Kayleen Memos, MD [facility] Pager [phone]  If 7PM-7AM, please contact night-coverage [email or web address] Password [facility] 04/12/2018, 11:04 AM

## 2018-04-12 NOTE — Progress Notes (Signed)
Called to pt's bedside to evaluate for low SPO2.  Pt pulled off her BIPAP mask and desated into the 70's.  Pt back on BIPAP but slow to recover.  SPO2 increased to 90-91%.  BIPAP settings changed:  IPAP 14, EPAP 8, FIO2 60%.  SPO2 increased to 95%.  Pt appears to be resting more comfortably.  Physician paged and updated.  Will cont to monitor.

## 2018-04-12 NOTE — NC FL2 (Signed)
[place] LEVEL OF CARE SCREENING TOOL     IDENTIFICATION  Patient Name: Andrea [name] Birthdate: 06-Jan-1951 Sex: female Admission Date (Current Location): 04/11/2018  [facility] and [place] Number:  Herbalist and Address:  [facility], [place] [address] [place], [place] [address]      Provider Number: [id number]  Attending Physician Name and Address:  Kayleen Memos, DO  Relative Name and Phone Number:  Graylon Gunning [facility]) [phone]    Current Level of Care: Hospital Recommended Level of Care: [place] Prior Approval Number:    Date Approved/Denied:   PASRR Number:    Discharge Plan: SNF    Current Diagnoses: Patient Active Problem List   Diagnosis Date Noted  . Acute on chronic diastolic CHF (congestive heart failure) ([place]) 04/11/2018  . RSV (respiratory syncytial virus pneumonia) 04/11/2018  . DM type 2 (diabetes mellitus, type 2) ([place]) 04/11/2018  . CKD (chronic kidney disease), stage IV ([place]) 04/11/2018  . Acute hypoxemic respiratory failure ([place]) 04/11/2018  . Encephalopathy 03/18/2018  . Hyperkalemia 03/15/2018  . 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 Chronic systolic heart failure ([place]) 02/12/2018  . Acute respiratory failure with hypoxia ([place]) 01/20/2018  . Constipation 01/20/2018  . High risk medication use 01/19/2018  . Chest pain, rule out acute myocardial infarction 01/17/2018  . Drug allergy, multiple 01/08/2018  . History of breast cancer 01/08/2018  . Ischemic chest pain ([place]) 01/04/2018  . Chest pain 01/04/2018  . CAD S/P percutaneous coronary angioplasty 12/30/2017  . Unspecified abdominal pain 06/02/2017  . C. difficile colitis 03/17/2017  . AKI (acute kidney injury) ([place])   . Diverticulitis 02/17/2017  . Frequent falls 01/08/2017  . Rhabdomyolysis 01/08/2017  . Intractable nausea and vomiting 11/23/2016  . Insulin  dependent diabetes mellitus ([place])   . Multiple rib fractures 11/28/2013  . Diarrhea 10/05/2013  . Dyslipidemia 09/16/2013  . History of seizures 08/04/2013  . Chronic pain 07/01/2013  . Anemia 03/16/2013  . Inadequate pain control 03/15/2013  . Cancer of left breast ([place]) 01/20/2013  . Migraine headache 11/10/2012  . 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 Diabetic peripheral neuropathy associated with type 2 diabetes mellitus ([place]) 04/17/2012  . GAD (generalized anxiety disorder) 05/25/2010  . Persistent insomnia 05/25/2010  . Disorder of kidney and ureter 01/26/2009  . Type 2 diabetes mellitus with hyperglycemia, with long-term current use of insulin ([place]) 11/03/2008  . Essential hypertension 09/21/2008  . Esophageal reflux 08/05/2008  . Seizure disorder ([place]) 04/12/2008  . Hypothyroidism 02/27/2007    Orientation RESPIRATION BLADDER Height & Weight     Self  Other (Comment)(BIPAP) Incontinent, External catheter(placed 04/11/18) Weight: 160 lb (72.6 kg) Height:  5\' 6"  (167.6 cm)  BEHAVIORAL SYMPTOMS/MOOD NEUROLOGICAL BOWEL NUTRITION STATUS      Incontinent Diet(renal with fluid restriction; Fluid restriction: 1200 mL Fluid)  AMBULATORY STATUS COMMUNICATION OF NEEDS Skin   Limited Assist Verbally Normal                       Personal Care Assistance Level of Assistance  Bathing, Feeding, Dressing Bathing Assistance: Limited assistance Feeding assistance: Limited assistance Dressing Assistance: Limited assistance     Functional Limitations Info  Sight, Speech, Hearing Sight Info: Adequate Hearing Info: Adequate Speech Info: Adequate    SPECIAL CARE FACTORS FREQUENCY  PT (By licensed PT), OT (By licensed OT), Speech therapy  PT Frequency: 5x OT Frequency: 5x     Speech Therapy Frequency: 5x      Contractures Contractures Info: Not present    Additional Factors Info  Code Status, Allergies, Isolation  Precautions Code Status Info: DNR Allergies Info:  Toradol Ketorolac Tromethamine, Vistaril Hydroxyzine Hcl, Erythromycin, Morphine And Related, Penicillins     Isolation Precautions Info: Droplet pre     Current Medications (04/12/2018):  This is the current hospital active medication list Current Facility-Administered Medications  Medication Dose Route Frequency Provider Last Rate Last Dose  . 0.9 %  sodium chloride infusion  250 mL Intravenous PRN Samuella Cota, MD      . acetaminophen (TYLENOL) tablet 650 mg  650 mg Oral Q6H PRN Samuella Cota, MD       Or  . acetaminophen (TYLENOL) suppository 650 mg  650 mg Rectal Q6H PRN Samuella Cota, MD      . atorvastatin (LIPITOR) tablet 80 mg  80 mg Oral QHS Samuella Cota, MD   80 mg at 04/11/18 2012  . busPIRone (BUSPAR) tablet 15 mg  15 mg Oral BID Samuella Cota, MD   15 mg at 04/11/18 2117  . carbamide peroxide (DEBROX) 6.5 % OTIC (EAR) solution 5 drop  5 drop Right EAR BID Samuella Cota, MD   5 drop at 04/11/18 2122  . divalproex (DEPAKOTE) DR tablet 1,000 mg  1,000 mg Oral Daily Samuella Cota, MD   1,000 mg at 04/11/18 0946  . furosemide (LASIX) injection 40 mg  40 mg Intravenous Q12H Samuella Cota, MD   40 mg at 04/11/18 2108  . hydrALAZINE (APRESOLINE) tablet 100 mg  100 mg Oral Q8H Samuella Cota, MD   100 mg at 04/11/18 2011  . insulin aspart (novoLOG) injection 0-5 Units  0-5 Units Subcutaneous QHS Samuella Cota, MD      . insulin aspart (novoLOG) injection 0-9 Units  0-9 Units Subcutaneous TID WC Samuella Cota, MD   1 Units at 04/11/18 1314  . insulin glargine (LANTUS) injection 10 Units  10 Units Subcutaneous QHS Samuella Cota, MD   10 Units at 04/11/18 2120  . isosorbide mononitrate (IMDUR) 24 hr tablet 30 mg  30 mg Oral Daily Samuella Cota, MD   30 mg at 04/11/18 0945  . levothyroxine (SYNTHROID, LEVOTHROID) tablet 200 mcg  200 mcg Oral Daily Samuella Cota, MD   200  mcg at 04/11/18 0944  . metoprolol tartrate (LOPRESSOR) injection 5 mg  5 mg Intravenous Q5 min PRN Blount, Scarlette Shorts T, NP      . morphine (MS CONTIN) 12 hr tablet 15 mg  15 mg Oral BID Samuella Cota, MD   15 mg at 04/11/18 2011  . ondansetron (ZOFRAN) tablet 4 mg  4 mg Oral Q6H PRN Samuella Cota, MD       Or  . ondansetron [facility]) injection 4 mg  4 mg Intravenous Q6H PRN Samuella Cota, MD      . pantoprazole (PROTONIX) EC tablet 40 mg  40 mg Oral Daily Samuella Cota, MD      . piperacillin-tazobactam (ZOSYN) IVPB 3.375 g  3.375 g Intravenous Marlou Starks, MD 12.5 mL/hr at 04/12/18 0610 3.375 g at 04/12/18 0610  . sodium chloride flush (NS) 0.9 % injection 3 mL  3 mL Intravenous Q12H Samuella Cota, MD   3 mL at 04/11/18 2122  . sodium chloride flush (NS) 0.9 %  injection 3 mL  3 mL Intravenous Q12H Samuella Cota, MD   3 mL at 04/11/18 2122  . sodium chloride flush (NS) 0.9 % injection 3 mL  3 mL Intravenous PRN Samuella Cota, MD         Discharge Medications: Please see discharge summary for a list of discharge medications.  Relevant Imaging Results:  Relevant Lab Results:   Additional Information SS#: [id number]  Eileen Stanford, LCSW

## 2018-04-12 NOTE — Progress Notes (Addendum)
Pt pulled BIPAP off and dropped into the 70's. Was fighting myself and another nurse to get it back on, clearly she was confused, A&OX1. Called respiratory and got her back on and sats back into the 90's. Paged Dr. Kennon Holter letting her know of her new confusion as well as her BP (190's/120's) has not come down with her regularly scheduled hypertension meds she got at 2200. Dr.Blount ordered coverage for her hypertension. Gave her first dose of PRN for her hypertension medication and her BP now is 184/95. Will continue to monitor pt, her mental status, and BP.   Pt continued to pull BIPAP off. Re-paged Dr. Kennon Holter. Chest X-ray ordered. 2nd dose of PRN hypertension IV med administered. Will continue to monitor pt.

## 2018-04-12 NOTE — TOC Initial Note (Signed)
Transition of Care [facility]) - Initial/Assessment Note    Patient Details  Name: Andrea [name] MRN: [id number] Date of Birth: November 04, 1950  Transition of Care [facility]) CM/SW Contact:    Gelene Mink, [place] Phone Number: 04/12/2018, 9:53 AM  Clinical Narrative:  Patient is a readmission from [facility]. Patient is oriented x 1. CSW completed assessment with the patient's son [name]. He would like his mother to return back to [facility]. Patient's son is familiar with the skilled nursing process. Patient will require a new insurance authorization. Patient's son had no other questions or concerns.    Expected Discharge Plan: Skilled Nursing Facility Barriers to Discharge: Insurance Authorization   Patient Goals and CMS Choice Patient states their goals for this hospitalization and ongoing recovery are:: Pt son wants his mother to return to the facility and get better CMS Medicare.gov Compare Post Acute Care list provided to:: Other (Comment Required)(Pt return back to her facility. ) Choice offered to / list presented to : NA  Expected Discharge Plan and Services Expected Discharge Plan: [place] In-house Referral: NA Discharge Planning Services: NA Post Acute Care Choice: NA Living arrangements for the past 2 months: Single Family Home, [place]                 DME Arranged: N/A DME Agency: NA HH Arranged: NA [place] Agency: NA  Prior Living Arrangements/Services Living arrangements for the past 2 months: [place], [place] Lives with:: Self Patient language and need for interpreter reviewed:: No Do you feel safe going back to the place where you live?: Yes      Need for Family Participation in Patient Care: No (Comment) Care giver support system in place?: Yes (comment)   Criminal Activity/Legal Involvement Pertinent to Current Situation/Hospitalization: No - Comment as needed  Activities of Daily Living Home Assistive  Devices/Equipment: Walker (specify type) ADL Screening (condition at time of admission) Patient's cognitive ability adequate to safely complete daily activities?: Yes Is the patient deaf or have difficulty hearing?: No Does the patient have difficulty seeing, even when wearing glasses/contacts?: No Does the patient have difficulty concentrating, remembering, or making decisions?: No Patient able to express need for assistance with ADLs?: Yes Does the patient have difficulty dressing or bathing?: No Independently performs ADLs?: Yes (appropriate for developmental age) Does the patient have difficulty walking or climbing stairs?: No Weakness of Legs: None Weakness of Arms/Hands: None  Permission Sought/Granted Permission sought to share information with : Case Manager Permission granted to share information with : Yes, Verbal Permission Granted  Share Information with NAME: [name] and [name]  Permission granted to share info w AGENCY: [place] granted to share info w Relationship: Son     Emotional Assessment Appearance:: Appears stated age Attitude/Demeanor/Rapport: Unable to Assess Affect (typically observed): Unable to Assess Orientation: : Oriented to Self Alcohol / Substance Use: Not Applicable Psych Involvement: No (comment)  Admission diagnosis:  Acute on chronic systolic congestive heart failure (HCC) [I50.23] Symptomatic anemia [D64.9] Acute hypoxemic respiratory failure ([place]) [J96.01] Patient Active Problem List   Diagnosis Date Noted  . Acute on chronic diastolic CHF (congestive heart failure) ([place]) 04/11/2018  . RSV (respiratory syncytial virus pneumonia) 04/11/2018  . DM type 2 (diabetes mellitus, type 2) ([place]) 04/11/2018  . CKD (chronic kidney disease), stage IV ([place]) 04/11/2018  . Acute hypoxemic respiratory failure ([place]) 04/11/2018  . Encephalopathy 03/18/2018  . Hyperkalemia 03/15/2018  . 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 Chronic systolic heart failure ([place]) 02/12/2018  . Acute respiratory failure with hypoxia ([place]) 01/20/2018  . Constipation 01/20/2018  . High risk medication use 01/19/2018  . Chest pain, rule out acute myocardial infarction 01/17/2018  . Drug allergy, multiple 01/08/2018  . History of breast cancer 01/08/2018  . Ischemic chest pain ([place]) 01/04/2018  . Chest pain 01/04/2018  . CAD S/P percutaneous coronary angioplasty 12/30/2017  . Unspecified abdominal pain 06/02/2017  . C. difficile colitis 03/17/2017  . AKI (acute kidney injury) ([place])   . Diverticulitis 02/17/2017  . Frequent falls 01/08/2017  . Rhabdomyolysis 01/08/2017  . Intractable nausea and vomiting 11/23/2016  . Insulin dependent diabetes mellitus ([place])   . Multiple rib fractures 11/28/2013  . Diarrhea 10/05/2013  . Dyslipidemia 09/16/2013  . History of seizures 08/04/2013  . Chronic pain 07/01/2013  . Anemia 03/16/2013  . Inadequate pain control 03/15/2013  . Cancer of left breast ([place]) 01/20/2013  . Migraine headache 11/10/2012  . 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 Diabetic peripheral neuropathy associated with type 2 diabetes mellitus ([place]) 04/17/2012  . GAD (generalized anxiety disorder) 05/25/2010  . Persistent insomnia 05/25/2010  . Disorder of kidney and ureter 01/26/2009  . Type 2 diabetes mellitus with hyperglycemia, with long-term current use of insulin ([place]) 11/03/2008  . Essential hypertension 09/21/2008  . Esophageal reflux 08/05/2008  . Seizure disorder ([place]) 04/12/2008  . Hypothyroidism 02/27/2007   PCP:  Medicine, [place]:  No Pharmacies Listed    Social Determinants of Health (SDOH) Interventions    Readmission Risk Interventions Readmission Risk Prevention Plan 04/12/2018  Transportation Screening Complete  Medication Review ([place]) Complete  PCP or Specialist appointment  within 3-5 days of discharge Complete  HRI or [place] Complete  SW Recovery Care/Counseling Consult Complete  Palliative Care Screening Not [place] Complete  Some recent data might be hidden

## 2018-04-12 NOTE — Evaluation (Signed)
Physical Therapy Evaluation Patient Details Name: Andrea [name] MRN: [id number] DOB: [birth year] Today's Date: 04/12/2018   History of Present Illness  22yow from [facility] PM[facility]diastolic CHF, 3VCAD, ischemiccardiomyopathypresented via EMS with resp distress, hypoxia tachypnea, rales, "fluid on her". Treated with BiPAP and then weaned to 3L [place]    Clinical Impression  Pt sat up on EOB and helped to scoot up in bed - with min to mod assist.  This exhausted her.  Her O2 sats stayed in high 90s.  HR 90-100.  Pt will need a lot of therapy and assistance to get stronger.  She will need SNF level care. She wants to go home but will need more help than she will have at home.  We talked about this.    Follow Up Recommendations SNF;Supervision/Assistance - 24 hour    Equipment Recommendations       Recommendations for Other Services       Precautions / Restrictions Precautions Precautions: Fall Precaution Comments: Watch SpO2 Restrictions Weight Bearing Restrictions: No Other Position/Activity Restrictions: pt having hard time breathing - on BIPAP and O2 sats 99% but pt weak and had hard time participating. HR 90-100.        Mobility  Bed Mobility Overal bed mobility: Needs Assistance Bed Mobility: Supine to Sit Rolling: Min assist         General bed mobility comments: Pt had slid down in bed when i came in. hse wanted to reposition.  we sat up on EOB and she scooted 3 times up h igher in the bed and then lay back down on side and was exhausted.  Nursing aware.   pt needed min assist to scoot  Transfers                    Ambulation/Gait                Stairs            Wheelchair Mobility    Modified Rankin (Stroke Patients Only)       Balance     Sitting balance-Leahy Scale: Fair                                       Pertinent Vitals/Pain Pain Assessment: No/denies pain    Home Living Family/patient expects to be  discharged to:: [place]: Bedside commode;Walker - 2 wheels;Shower seat;Grab bars - tub/shower Additional Comments: does not have 24/7 assist    Prior Function           Comments: uses a rollator at baseline, Pt has a car and does everything herself     Hand Dominance        Extremity/Trunk Assessment        Lower Extremity Assessment Lower Extremity Assessment: Generalized weakness    Cervical / Trunk Assessment Cervical / Trunk Assessment: Normal  Communication   Communication: (Pt on BIPAP and too SOB to talk more than one word.  Hard to understand.  She would shake her head with yes and no questions.)  Cognition Arousal/Alertness: Lethargic Behavior During Therapy: (pt kept eyes closed. she was cooperative but tires quickly and on BIPAP so hard to talk)  General Comments: I let pt know that her sons plan was for her to go back to [facility] for more assistance as not enough help at home. she shook her head no - i talked to her about fact she would go there to get stronger and then home when she could tolerate it      General Comments      Exercises     Assessment/Plan    PT Assessment Patient needs continued PT services  PT Problem List Cardiopulmonary status limiting activity;Decreased strength;Decreased knowledge of precautions;Decreased mobility;Decreased knowledge of use of DME;Decreased activity tolerance;Decreased safety awareness       PT Treatment Interventions DME instruction;Gait training;Functional mobility training;Therapeutic activities;Therapeutic exercise;Balance training;Patient/family education    PT Goals (Current goals can be found in the Care Plan section)  Acute Rehab PT Goals Patient Stated Goal: pt wants to go home -she knows she will needs lots of help to get strong enough to do this PT Goal Formulation: With patient Time For Goal  Achievement: 04/26/18 Potential to Achieve Goals: Fair    Frequency Min 2X/week   Barriers to discharge Decreased caregiver support      Co-evaluation               AM-PAC PT "6 Clicks" Mobility  Outcome Measure Help needed turning from your back to your side while in a flat bed without using bedrails?: A Little Help needed moving from lying on your back to sitting on the side of a flat bed without using bedrails?: A Little Help needed moving to and from a bed to a chair (including a wheelchair)?: Total Help needed standing up from a chair using your arms (e.g., wheelchair or bedside chair)?: Total Help needed to walk in hospital room?: Total Help needed climbing 3-5 steps with a railing? : Total 6 Click Score: 10    End of Session Equipment Utilized During Treatment: (BIPAP) Activity Tolerance: Patient limited by fatigue;Treatment limited secondary to medical complications (Comment) Patient left: in bed;with call bell/phone within reach Nurse Communication: Mobility status PT Visit Diagnosis: Muscle weakness (generalized) (M62.81);Unsteadiness on feet (R26.81)    Time: 1245-1300 PT Time Calculation (min) (ACUTE ONLY): 15 min   Charges:   PT Evaluation $PT Eval Moderate Complexity: 1 Mod PT Treatments $Therapeutic Activity: 8-22 mins        04/12/2018   Rande Lawman, PT   Loyal Buba 04/12/2018, 1:14 PM

## 2018-04-13 LAB — BASIC METABOLIC PANEL
Anion gap: 8 (ref 5–15)
BUN: 43 mg/dL — ABNORMAL HIGH (ref 8–23)
CO2: 26 mmol/L (ref 22–32)
CREATININE: 2.12 mg/dL — AB (ref 0.44–1.00)
Calcium: 8 mg/dL — ABNORMAL LOW (ref 8.9–10.3)
Chloride: 107 mmol/L (ref 98–111)
GFR calc Af Amer: 27 mL/min — ABNORMAL LOW (ref >60)
GFR calc non Af Amer: 24 mL/min — ABNORMAL LOW (ref >60)
Glucose, Bld: 88 mg/dL (ref 70–99)
Potassium: 4.5 mmol/L (ref 3.5–5.1)
Sodium: 141 mmol/L (ref 135–145)

## 2018-04-13 LAB — GLUCOSE, CAPILLARY
Glucose-Capillary: 141 mg/dL — ABNORMAL HIGH (ref 70–99)
Glucose-Capillary: 165 mg/dL — ABNORMAL HIGH (ref 70–99)
Glucose-Capillary: 138 mg/dL — ABNORMAL HIGH (ref 70–99)

## 2018-04-13 MED ORDER — HYDROMORPHONE HCL 1 MG/ML IJ SOLN
0.5000 mg | Freq: Once | INTRAMUSCULAR | Status: AC
  Administered 2018-04-13: 0.5 mg via INTRAVENOUS
  Filled 2018-04-13: qty 1

## 2018-04-13 MED ORDER — ENOXAPARIN SODIUM 30 MG/0.3ML ~~LOC~~ SOLN
30.0000 mg | SUBCUTANEOUS | Status: DC
  Administered 2018-04-14 – 2018-04-17 (×4): 30 mg via SUBCUTANEOUS
  Filled 2018-04-13 (×5): qty 0.3

## 2018-04-13 MED ORDER — HYDROMORPHONE HCL 1 MG/ML IJ SOLN
0.5000 mg | INTRAMUSCULAR | Status: DC | PRN
  Administered 2018-04-13 – 2018-04-18 (×21): 0.5 mg via INTRAVENOUS
  Filled 2018-04-13 (×22): qty 1

## 2018-04-13 MED ORDER — ENOXAPARIN SODIUM 40 MG/0.4ML ~~LOC~~ SOLN
40.0000 mg | SUBCUTANEOUS | Status: DC
  Administered 2018-04-13: 30 mg via SUBCUTANEOUS

## 2018-04-13 MED ORDER — FUROSEMIDE 10 MG/ML IJ SOLN
20.0000 mg | Freq: Two times a day (BID) | INTRAMUSCULAR | Status: DC
  Administered 2018-04-13 – 2018-04-16 (×6): 20 mg via INTRAVENOUS
  Filled 2018-04-13 (×8): qty 2

## 2018-04-13 NOTE — Progress Notes (Signed)
Pt placed on 10L HFNC.  Currently tolerating well with SPO2 100%

## 2018-04-13 NOTE — Progress Notes (Signed)
Physical Therapy Treatment Patient Details Name: Zoraida Havrilla MRN: [id number] DOB: 1950/11/04 Today's Date: 04/13/2018    History of Present Illness 39yow from [facility] PMHdiastolic CHF, 3VCAD, ischemiccardiomyopathypresented via EMS with resp distress, hypoxia tachypnea, rales, "fluid on her". Treated with BiPAP and then weaned to 3L [place]    PT Comments    Patient received in bed with flat affect and generally lethargic but willing to participate in PT/OT session. Able to complete bed mobility with min guard and able to tolerate sitting at EOB for approximately 10-15 min during this session while participating in B LE exercises but very fatigued. Deferred standing or gait activities due to elevated BPs (see below in note) and patient fatigue. Educated on IS use in bed and able to demonstrate correct use of device for improved lung expansion and clearance of phlegm. She was left in bed with all needs met, bed alarm active this morning.   156/115 (129) sitting EOB initially   BP 168/99 (120) sitting EOB x5 minutes End of session: 119/95 (101) supine in bed at EOS      Follow Up Recommendations  SNF;Supervision/Assistance - 24 hour     Equipment Recommendations  Other (comment)(home O2 )    Recommendations for Other Services       Precautions / Restrictions Precautions Precautions: Fall Precaution Comments: Watch SpO2 and BP  Restrictions Weight Bearing Restrictions: No    Mobility  Bed Mobility Overal bed mobility: Needs Assistance Bed Mobility: Supine to Sit;Sit to Supine     Supine to sit: Min guard Sit to supine: Min guard   General bed mobility comments: min guard for safety, no physical assist given besides line management   Transfers                 General transfer comment: patient declined/deferred due to elevated BP   Ambulation/Gait             General Gait Details: patient declined/deferred due to elevated BP    Stairs              Wheelchair Mobility    Modified Rankin (Stroke Patients Only)       Balance Overall balance assessment: Needs assistance Sitting-balance support: No upper extremity supported;Feet supported Sitting balance-Leahy Scale: Good                                      Cognition Arousal/Alertness: Lethargic Behavior During Therapy: Flat affect Overall Cognitive Status: Impaired/Different from baseline Area of Impairment: Memory;Following commands;Safety/judgement;Awareness;Problem solving;Attention                   Current Attention Level: Sustained Memory: Decreased short-term memory Following Commands: Follows one step commands with increased time;Follows one step commands consistently Safety/Judgement: Decreased awareness of safety;Decreased awareness of deficits Awareness: Emergent Problem Solving: Decreased initiation;Slow processing;Requires verbal cues        Exercises      General Comments        Pertinent Vitals/Pain Pain Assessment: 0-10 Pain Score: 8  Pain Location: headache  Pain Descriptors / Indicators: Aching Pain Intervention(s): Limited activity within patient's tolerance;Monitored during session;Repositioned    Home Living                      Prior Function            PT Goals (current goals can now be found in  the care plan section) Acute Rehab PT Goals Patient Stated Goal: pt wants to go home -she knows she will needs lots of help to get strong enough to do this PT Goal Formulation: With patient Time For Goal Achievement: 04/26/18 Potential to Achieve Goals: Fair Progress towards PT goals: Not progressing toward goals - comment(declined mobility and activity tolerance )    Frequency    Min 2X/week      PT Plan Current plan remains appropriate    Co-evaluation PT/OT/SLP Co-Evaluation/Treatment: Yes Reason for Co-Treatment: Complexity of the patient's impairments (multi-system involvement) PT  goals addressed during session: Strengthening/ROM;Mobility/safety with mobility        AM-PAC PT "6 Clicks" Mobility   Outcome Measure  Help needed turning from your back to your side while in a flat bed without using bedrails?: A Little Help needed moving from lying on your back to sitting on the side of a flat bed without using bedrails?: A Little Help needed moving to and from a bed to a chair (including a wheelchair)?: Total Help needed standing up from a chair using your arms (e.g., wheelchair or bedside chair)?: Total Help needed to walk in hospital room?: Total Help needed climbing 3-5 steps with a railing? : Total 6 Click Score: 10    End of Session Equipment Utilized During Treatment: Oxygen Activity Tolerance: Patient limited by fatigue;Treatment limited secondary to medical complications (Comment);Patient limited by lethargy(elevated BP ) Patient left: in bed;with call bell/phone within reach;with bed alarm set   PT Visit Diagnosis: Muscle weakness (generalized) (M62.81);Unsteadiness on feet (R26.81)     Time: 6256-3893 PT Time Calculation (min) (ACUTE ONLY): 28 min  Charges:  $Therapeutic Activity: 8-22 mins                     Deniece Ree PT, DPT, CBIS  Supplemental Physical Therapist [place]    Pager [phone] Acute Rehab Office [phone]

## 2018-04-13 NOTE — Progress Notes (Signed)
PROGRESS NOTE  Kestrel Mis TDD:[id number] DOB: 20-Sep-1950 DOA: 04/11/2018 PCP: [facility], [facility]  HPI/Recap of past 24 hours: 68yow from [facility] PMH diastolic CHF, 3VCAD, ischemic cardiomyopathy presented via EMS with resp distress, hypoxia tachypnea, rales, "fluid on her". Treated with BiPAP and then weaned to 3L [place]  Reports leg edema for more than a week. Sudden SOB last night with severe chest pressure, no specific a/a factors. No diaphoresis. Had vomiting, no diarrhea. No fever or infectious symptoms. Treated with Lasix alone in ED with substantial improvement. RSV positive.  I called [facility] for more hx. Went through phone tree. No answer.  Hospitalized 2/23 - 3/6 for CHF, AKI, confusion from narcotics  COVID SCREEN Fever: NO  Cough: YES   SOB: YES URI symptoms: NO GI symptoms: YES vomiting Travel: NO--in NH 2 weeks and prior was in hospital. No one traveled to her [place] contacts: NO  ED Course: Lasix and BiPAP   04/12/18: Seen and examined at bedside.  She was on BiPAP.  Admits to persistent cough.  She denies chest pain.  Appears comfortable on BiPAP.  04/13/18: Seen and examined at bedside.  No acute events overnight.  Weaning off BiPAP and currently on high flow nasal cannula at 10 L.  Admits to persistent cough and dyspnea with minimal movement.   Assessment/Plan: Principal Problem:   Acute hypoxemic respiratory failure (HCC) Active Problems:   Chronic pain   Chest pain   Acute respiratory failure with hypoxia (HCC)   Anemia   Acute on chronic diastolic CHF (congestive heart failure) (HCC)   RSV (respiratory syncytial virus pneumonia)   DM type 2 (diabetes mellitus, type 2) (HCC)   CKD (chronic kidney disease), stage IV (HCC)  Improving but persistent acute hypoxic respiratory failure multifactorial secondary to RSV pneumonia versus acute on chronic diastolic CHF Presented with hypoxia, tachycardia, tachypnea placed on  BiPAP Independent review chest x-ray done on admission which revealed bilateral pulmonary infiltrates suggestive of multilobar pneumonia Respiratory panel positive for RSV Continue IV antibiotics Zosyn day #2 Obtain sputum culture Blood cultures x2 no growth to date Wean off BiPAP Maintain O2 saturation greater than 92% Afebrile  Acute on chronic diastolic CHF Presented with elevated BNP Increase in pulmonary vascularity on chest x-ray done in admission Continue Lasix continue to monitor urine output Troponin flat peaked at 0.06  CKD 3 Presented with creatinine of 2.04 Creatinine today 2.12 from 1.86 yesterday Avoid nephrotoxic agents/hypotension/dehydration On IV Lasix 20 mg twice daily for acute on chronic diastolic CHF Net -623 cc since admission Continue to monitor urine output  Uncontrolled hypertension Continue home antihypertensive medication Continue IV Lasix IV hydralazine PRN  Resolved hyperkalemia Potassium 5.3 on 04/12/2018 Potassium on 04/13/2018 was 4.5 Continue to monitor  Type 2 diabetes complicated by CKD Hemoglobin A1c from 03/15/2018 was 6.4 from 7.7 on 12/31/2017 Avoid hypoglycemia Sensitive insulin sliding scale  Physical debility PT OT assessed and recommended SNF CSW consulted for placement Fall precautions    DVT prophylaxis:  Subcu Lovenox daily Code Status: DNR Family Communication:  None at bedside Consults called: none    Disposition: Possible discharge to SNF in 2 to 3 days when oxygen requirement is back to baseline or close to baseline.    Objective: Vitals:   04/13/18 0439 04/13/18 0625 04/13/18 0700 04/13/18 0820  BP: 121/85  (!) 137/100   Pulse: 99  100 100  Resp: 16  13 16   Temp: 98.9 F (37.2 C)  97.7 F (36.5  C)   TempSrc: Axillary  Oral   SpO2: 99% 100% 100%   Weight: 74.3 kg     Height:        Intake/Output Summary (Last 24 hours) at 04/13/2018 1108 Last data filed at 04/13/2018 1000 Gross per 24 hour  Intake  300 ml  Output -  Net 300 ml   Filed Weights   04/11/18 0318 04/13/18 0439  Weight: 72.6 kg 74.3 kg    Exam:  . General: 68 y.o. year-old female well-developed well-nourished no acute distress.  Alert and interactive on high flow nasal cannula . Cardiovascular: Regular rate and rhythm with no rubs or gallops.  No JVD or thyromegaly noted. [name] Respiratory: Mild rales at bases bilaterally no wheezes noted. . Abdomen: Soft nontender nondistended with normal bowel sounds x4 quadrants. . Musculoskeletal: Trace lower extremity edema bilaterally. 2/4 pulses in all 4 extremities. [name] Psychiatry: Mood is appropriate for condition and setting   Data Reviewed: CBC: Recent Labs  Lab 04/11/18 0341 04/11/18 0350 04/11/18 1840 04/12/18 0609  WBC 4.9  --  3.8* 4.1  NEUTROABS 4.0  --   --   --   HGB 7.0* 6.8* 8.7* 9.6*  HCT 22.7* 20.0* 26.7* 29.0*  MCV 96.2  --  90.8 92.4  PLT 220  --  204 947   Basic Metabolic Panel: Recent Labs  Lab 04/11/18 0341 04/11/18 0350 04/11/18 1253 04/12/18 0306 04/13/18 0339  NA 136 136 137 140 141  K 5.6* 5.8* 4.9 5.3* 4.5  CL 105  --  104 107 107  CO2 20*  --  23 23 26   GLUCOSE 186*  --  148* 88 88  BUN 36*  --  35* 36* 43*  CREATININE 2.13*  --  2.04* 1.86* 2.12*  CALCIUM 8.0*  --  8.2* 8.2* 8.0*   GFR: Estimated Creatinine Clearance: 26.9 mL/min (A) (by C-G formula based on SCr of 2.12 mg/dL (H)). Liver Function Tests: Recent Labs  Lab 04/11/18 0341  AST 39  ALT 15  ALKPHOS 47  BILITOT 1.0  PROT 5.9*  ALBUMIN 2.3*   No results for input(s): LIPASE, AMYLASE in the last 168 hours. No results for input(s): AMMONIA in the last 168 hours. Coagulation Profile: No results for input(s): INR, PROTIME in the last 168 hours. Cardiac Enzymes: Recent Labs  Lab 04/11/18 0341 04/11/18 1253 04/11/18 1840  TROPONINI <0.03 0.05* 0.06*   BNP (last 3 results) No results for input(s): PROBNP in the last 8760 hours. HbA1C: No results for input(s):  HGBA1C in the last 72 hours. CBG: Recent Labs  Lab 04/12/18 0411 04/12/18 0846 04/12/18 1207 04/12/18 1705 04/12/18 2104  GLUCAP 86 97 84 83 75   Lipid Profile: No results for input(s): CHOL, HDL, LDLCALC, TRIG, CHOLHDL, LDLDIRECT in the last 72 hours. Thyroid Function Tests: No results for input(s): TSH, T4TOTAL, FREET4, T3FREE, THYROIDAB in the last 72 hours. Anemia Panel: No results for input(s): VITAMINB12, FOLATE, FERRITIN, TIBC, IRON, RETICCTPCT in the last 72 hours. Urine analysis:    Component Value Date/Time   COLORURINE YELLOW 02/25/2018 2316   APPEARANCEUR HAZY (A) 02/25/2018 2316   LABSPEC 1.014 02/25/2018 2316   PHURINE 5.0 02/25/2018 2316   GLUCOSEU NEGATIVE 02/25/2018 2316   HGBUR SMALL (A) 02/25/2018 2316   BILIRUBINUR NEGATIVE 02/25/2018 2316   KETONESUR NEGATIVE 02/25/2018 2316   PROTEINUR >=300 (A) 02/25/2018 2316   UROBILINOGEN 1.0 12/02/2013 1612   NITRITE NEGATIVE 02/25/2018 2316   LEUKOCYTESUR SMALL (A) 02/25/2018 2316  Sepsis Labs: @LABRCNTIP (procalcitonin:4,lacticidven:4)  ) Recent Results (from the past 240 hour(s))  Respiratory Panel by PCR     Status: Abnormal   Collection Time: 04/11/18  4:16 AM  Result Value Ref Range Status   Adenovirus NOT DETECTED NOT DETECTED Final   Coronavirus 229E NOT DETECTED NOT DETECTED Final    Comment: (NOTE) The Coronavirus on the Respiratory Panel, DOES NOT test for the novel  Coronavirus (2019 nCoV)    Coronavirus HKU1 NOT DETECTED NOT DETECTED Final   Coronavirus NL63 NOT DETECTED NOT DETECTED Final   Coronavirus OC43 NOT DETECTED NOT DETECTED Final   Metapneumovirus NOT DETECTED NOT DETECTED Final   Rhinovirus / Enterovirus NOT DETECTED NOT DETECTED Final   Influenza A NOT DETECTED NOT DETECTED Final   Influenza B NOT DETECTED NOT DETECTED Final   Parainfluenza Virus 1 NOT DETECTED NOT DETECTED Final   Parainfluenza Virus 2 NOT DETECTED NOT DETECTED Final   Parainfluenza Virus 3 NOT DETECTED NOT  DETECTED Final   Parainfluenza Virus 4 NOT DETECTED NOT DETECTED Final   Respiratory Syncytial Virus DETECTED (A) NOT DETECTED Final    Comment: CRITICAL RESULT CALLED TO, READ BACK BY AND VERIFIED WITH: RN K COBB [id number] MLM    Bordetella pertussis NOT DETECTED NOT DETECTED Final   Chlamydophila pneumoniae NOT DETECTED NOT DETECTED Final   Mycoplasma pneumoniae NOT DETECTED NOT DETECTED Final    Comment: Performed at [facility], [address] [place], [place] [address]  Blood culture (routine x 2)     Status: None (Preliminary result)   Collection Time: 04/11/18  4:18 AM  Result Value Ref Range Status   Specimen Description BLOOD RIGHT HAND  Final   Special Requests   Final    BOTTLES DRAWN AEROBIC ONLY Blood Culture adequate volume   Culture   Final    NO GROWTH 2 DAYS Performed at [facility], [place] [address], [place], [place] [address]    Report Status PENDING  Incomplete  Blood culture (routine x 2)     Status: None (Preliminary result)   Collection Time: 04/11/18  4:20 AM  Result Value Ref Range Status   Specimen Description BLOOD RIGHT ARM  Final   Special Requests   Final    BOTTLES DRAWN AEROBIC AND ANAEROBIC Blood Culture adequate volume   Culture   Final    NO GROWTH 2 DAYS Performed at [facility], [place] [address] [place], [place] [address]    Report Status PENDING  Incomplete      Studies: No results found.  Scheduled Meds: . atorvastatin  80 mg Oral QHS  . busPIRone  15 mg Oral BID  . carbamide peroxide  5 drop Right EAR BID  . divalproex  1,000 mg Oral Daily  . hydrALAZINE  100 mg Oral Q8H  . insulin aspart  0-5 Units Subcutaneous QHS  . insulin aspart  0-9 Units Subcutaneous TID WC  . insulin glargine  10 Units Subcutaneous QHS  . isosorbide mononitrate  30 mg Oral Daily  . levothyroxine  200 mcg Oral Daily  . pantoprazole  40 mg Oral Daily  . sodium chloride flush  3 mL Intravenous Q12H  . sodium chloride flush  3 mL  Intravenous Q12H    Continuous Infusions: . sodium chloride    . piperacillin-tazobactam (ZOSYN)  IV 3.375 g (04/13/18 0540)     LOS: 2 days     Kayleen Memos, MD [facility] Pager [phone]  If 7PM-7AM, please contact night-coverage [email or web address] Password [email or web address] 04/13/2018, 11:08 AM

## 2018-04-13 NOTE — Progress Notes (Signed)
Paged [facility] for head and generalized pain.  She is NPO on BIPAP

## 2018-04-13 NOTE — Progress Notes (Addendum)
Daily Nursing Note  Assumed care of patient at 1600 from [facility]. Assessed patient and provided IV dilaudid for severe total body pain. Lasix will be given this evening as prior dose was administered at 1400. Sputum cup provided to patient for sputum sample.This will be endorsed to evening RN. All patient needs met by the end of shift.

## 2018-04-13 NOTE — Evaluation (Signed)
Occupational Therapy Evaluation Patient Details Name: Andrea [name] MRN: [id number] DOB: 1950/03/03 Today's Date: 04/13/2018    History of Present Illness 9yow from [facility] PMHdiastolic CHF, 3VCAD, ischemiccardiomyopathypresented via EMS with resp distress, hypoxia tachypnea, rales, "fluid on her". Treated with BiPAP and then weaned to 3L [place]   Clinical Impression   This 68 y/o female presents with the above. Pt was most recently in SNF for ST rehab services, reports she was completing functional mobility using AD, reports completing ADL without assist prior to most recent hospitalization. Pt limited due to generalized weakness, decreased endurance, and pt also somewhat with self-limiting behavior during session completion. Pt able to perform bed mobility at minguard assist, sitting EOB >10 min during session with minguard assist for sitting balance. Pt on 10L HFNC with SpO2 overall 99-100%; BP elevated this session initially 156/115 (129) after transitioning to sitting EOB, after approx 5 min sitting EOB 168/99 (120), and BP 119/95 (101) end of session after return to supine. Pt currently requires minA for seated grooming and UB ADL, max-totalA for LB ADL. She will benefit from continued acute OT services and recommend continued OT services in SNF setting after discharge to maximize her strength, safety and independence with ADL and mobility. Will follow.     Follow Up Recommendations  SNF;Supervision/Assistance - 24 hour    Equipment Recommendations  Other (comment)(defer to next venue)           Precautions / Restrictions Precautions Precautions: Fall Precaution Comments: Watch SpO2 and BP  Restrictions Weight Bearing Restrictions: No      Mobility Bed Mobility Overal bed mobility: Needs Assistance Bed Mobility: Supine to Sit;Sit to Supine     Supine to sit: Min guard Sit to supine: Min guard   General bed mobility comments: min guard for safety, no physical  assist given other than line management   Transfers                 General transfer comment: patient declined/deferred due to elevated BP     Balance Overall balance assessment: Needs assistance Sitting-balance support: No upper extremity supported;Feet supported Sitting balance-Leahy Scale: Good                                     ADL either performed or assessed with clinical judgement   ADL Overall ADL's : Needs assistance/impaired Eating/Feeding: Set up;Sitting;Supervision/ safety   Grooming: Wash/dry face;Sitting;Minimal assistance Grooming Details (indicate cue type and reason): required cues to actually initiate and perform task ("wipe your mouth")  Upper Body Bathing: Minimal assistance;Sitting   Lower Body Bathing: Moderate assistance;Maximal assistance;Sitting/lateral leans   Upper Body Dressing : Minimal assistance;Sitting   Lower Body Dressing: Maximal assistance;Sitting/lateral leans       Toileting- Clothing Manipulation and Hygiene: Total assistance         General ADL Comments: pt performing bed mobility and sat EOB during session, did not attempt to stand due to elevated BP; pt significantly fatigued with activity     Vision         Perception     Praxis      Pertinent Vitals/Pain Pain Assessment: 0-10 Pain Score: 8  Pain Location: headache  Pain Descriptors / Indicators: Aching Pain Intervention(s): Monitored during session;Limited activity within patient's tolerance;Repositioned     Hand Dominance Right   Extremity/Trunk Assessment Upper Extremity Assessment Upper Extremity Assessment: Generalized weakness   Lower Extremity Assessment  Lower Extremity Assessment: Defer to PT evaluation       Communication Communication Communication: No difficulties   Cognition Arousal/Alertness: Lethargic Behavior During Therapy: Flat affect Overall Cognitive Status: Impaired/Different from baseline Area of Impairment:  Memory;Following commands;Safety/judgement;Awareness;Problem solving;Attention                   Current Attention Level: Sustained Memory: Decreased short-term memory Following Commands: Follows one step commands with increased time;Follows one step commands consistently Safety/Judgement: Decreased awareness of safety;Decreased awareness of deficits Awareness: Emergent Problem Solving: Decreased initiation;Slow processing;Requires verbal cues General Comments: pt not very engaging or participative today and requires max encouragement to initiate all functional tasks; not very forthcoming with information when asking about PLOF and often only responding with one word answers - once returned to bed pt more conversive asking for orange juice, phone charge   General Comments  BP initially 156/115 (129) seated EOB, retaken after approx 5 min and 168/99 (120), BP 119/95 end of session after return to supine in bed; pt on 10L HFNC with O2 sats 99-100% throughout    Exercises Exercises: General Upper Extremity;Other exercises General Exercises - Upper Extremity Digit Composite Flexion: AROM;Both Composite Extension: AROM;Both Other Exercises Other Exercises: chest press (modifed) AAROM, x10 reps bil UEs Other Exercises: encouraged continued digit flex/extension for edema managment Other Exercises: use of IS x10; encouraged continued used throughout the day   Shoulder Instructions      Home Living Family/patient expects to be discharged to:: Skilled nursing facility Living Arrangements: Alone                               Additional Comments: most recently in SNF for ST rehab      Prior Functioning/Environment Level of Independence: Independent with assistive device(s)        Comments: pt reports was walking using AD at SNF, reports she was completing ADL without assist, though unsure of accuracy - pt not very forthcoming with information        OT Problem List:  Decreased activity tolerance;Impaired balance (sitting and/or standing);Decreased safety awareness;Decreased knowledge of use of DME or AE;Decreased knowledge of precautions;Cardiopulmonary status limiting activity;Increased edema;Decreased strength;Decreased range of motion;Decreased cognition      OT Treatment/Interventions: Self-care/ADL training;Therapeutic exercise;DME and/or AE instruction;Therapeutic activities;Patient/family education;Balance training;Energy conservation;Cognitive remediation/compensation    OT Goals(Current goals can be found in the care plan section) Acute Rehab OT Goals Patient Stated Goal: pt wants to go home -she knows she will needs lots of help to get strong enough to do this OT Goal Formulation: With patient Time For Goal Achievement: 04/27/18 Potential to Achieve Goals: Fair  OT Frequency: Min 2X/week   Barriers to D/C:            Co-evaluation PT/OT/SLP Co-Evaluation/Treatment: Yes Reason for Co-Treatment: Complexity of the patient's impairments (multi-system involvement)(pt with poor activity tolerance) PT goals addressed during session: Strengthening/ROM;Mobility/safety with mobility OT goals addressed during session: Strengthening/ROM;ADL's and self-care      AM-PAC OT "6 Clicks" Daily Activity     Outcome Measure Help from another person eating meals?: A Little Help from another person taking care of personal grooming?: A Lot Help from another person toileting, which includes using toliet, bedpan, or urinal?: Total Help from another person bathing (including washing, rinsing, drying)?: A Lot Help from another person to put on and taking off regular upper body clothing?: A Lot Help from another person to put on  and taking off regular lower body clothing?: Total 6 Click Score: 11   End of Session Equipment Utilized During Treatment: Oxygen Nurse Communication: Mobility status  Activity Tolerance: Patient limited by fatigue;Other  (comment)(elevated BP seated EOB) Patient left: in bed;with call bell/phone within reach;with bed alarm set  OT Visit Diagnosis: Muscle weakness (generalized) (M62.81);Adult, failure to thrive (R62.7);Other symptoms and signs involving cognitive function                Time: 7425-9563 OT Time Calculation (min): 28 min Charges:  OT General Charges $OT Visit: 1 Visit OT Evaluation $OT Eval Moderate Complexity: 1 Mod  Lou Cal, OT [employer] Pager [phone] Office [phone]  Raymondo Band 04/13/2018, 10:07 AM

## 2018-04-14 LAB — EXPECTORATED SPUTUM ASSESSMENT W GRAM STAIN, RFLX TO RESP C: Special Requests: NORMAL

## 2018-04-14 LAB — BASIC METABOLIC PANEL
ANION GAP: 8 (ref 5–15)
BUN: 47 mg/dL — ABNORMAL HIGH (ref 8–23)
CO2: 28 mmol/L (ref 22–32)
Calcium: 7.7 mg/dL — ABNORMAL LOW (ref 8.9–10.3)
Chloride: 101 mmol/L (ref 98–111)
Creatinine, Ser: 1.93 mg/dL — ABNORMAL HIGH (ref 0.44–1.00)
GFR calc Af Amer: 31 mL/min — ABNORMAL LOW (ref >60)
GFR, EST NON AFRICAN AMERICAN: 26 mL/min — AB (ref >60)
Glucose, Bld: 109 mg/dL — ABNORMAL HIGH (ref 70–99)
Potassium: 4.1 mmol/L (ref 3.5–5.1)
Sodium: 137 mmol/L (ref 135–145)

## 2018-04-14 LAB — GLUCOSE, CAPILLARY
Glucose-Capillary: 97 mg/dL (ref 70–99)
Glucose-Capillary: 109 mg/dL — ABNORMAL HIGH (ref 70–99)
Glucose-Capillary: 94 mg/dL (ref 70–99)
Glucose-Capillary: 125 mg/dL — ABNORMAL HIGH (ref 70–99)

## 2018-04-14 LAB — LACTIC ACID, PLASMA: Lactic Acid, Venous: 0.5 mmol/L (ref 0.5–1.9)

## 2018-04-14 LAB — PROCALCITONIN: Procalcitonin: 1.43 ng/mL

## 2018-04-14 MED ORDER — IPRATROPIUM-ALBUTEROL 0.5-2.5 (3) MG/3ML IN SOLN
3.0000 mL | RESPIRATORY_TRACT | Status: DC | PRN
  Administered 2018-04-14 – 2018-04-16 (×4): 3 mL via RESPIRATORY_TRACT
  Filled 2018-04-14 (×4): qty 3

## 2018-04-14 MED ORDER — HYDROCOD POLST-CPM POLST ER 10-8 MG/5ML PO SUER
5.0000 mL | Freq: Once | ORAL | Status: AC
  Administered 2018-04-14: 5 mL via ORAL
  Filled 2018-04-14: qty 5

## 2018-04-14 MED ORDER — METOCLOPRAMIDE HCL 5 MG/ML IJ SOLN
10.0000 mg | Freq: Once | INTRAMUSCULAR | Status: AC
  Administered 2018-04-14: 10 mg via INTRAVENOUS
  Filled 2018-04-14: qty 2

## 2018-04-14 MED ORDER — DIPHENHYDRAMINE HCL 50 MG/ML IJ SOLN
25.0000 mg | Freq: Once | INTRAMUSCULAR | Status: AC
  Administered 2018-04-14: 25 mg via INTRAVENOUS
  Filled 2018-04-14: qty 1

## 2018-04-14 NOTE — Progress Notes (Signed)
PROGRESS NOTE  Andrea [name] JJO:[id number] DOB: [birth year] DOA: 04/11/2018 PCP: Medicine, [facility]  HPI/Recap of past 24 hours: 68yow from [facility] diastolic CHF, 3VCAD, ischemic cardiomyopathy presented via EMS with resp distress, hypoxia tachypnea, rales, "fluid on her". Treated with BiPAP and then weaned to 3L [place]  Reports leg edema for more than a week. Sudden SOB last night with severe chest pressure, no specific a/a factors. No diaphoresis. Had vomiting, no diarrhea. No fever or infectious symptoms. Treated with Lasix alone in ED with substantial improvement. RSV positive.  I called [facility] for more hx. Went through phone tree. No answer.  Hospitalized 2/23 - 3/6 for CHF, AKI, confusion from narcotics  COVID SCREEN Fever: NO  Cough: YES   SOB: YES URI symptoms: NO GI symptoms: YES vomiting Travel: NO--in NH 2 weeks and prior was in hospital. No one traveled to her [place] contacts: NO  ED Course: Lasix and [facility] course complicated by persistent hypoxia requiring noninvasive mechanical ventilation BiPAP.  Weaned off to high flow nasal cannula on 04/13/2018.  04/14/18: Patient seen and examined at her bedside.  No acute events overnight.  States her breathing and cough are improving.  Not on oxygen supplementation at baseline.  We will continue to wean off oxygen as tolerated.   Assessment/Plan: Principal Problem:   Acute hypoxemic respiratory failure (HCC) Active Problems:   Chronic pain   Chest pain   Acute respiratory failure with hypoxia (HCC)   Anemia   Acute on chronic diastolic CHF (congestive heart failure) (HCC)   RSV (respiratory syncytial virus pneumonia)   DM type 2 (diabetes mellitus, type 2) (HCC)   CKD (chronic kidney disease), stage IV (HCC)  Improving but persistent acute hypoxic respiratory failure multifactorial secondary to RSV pneumonia versus acute on chronic diastolic CHF Presented with hypoxia,  tachycardia, tachypnea placed on BiPAP Independent review chest x-ray done on admission which revealed bilateral pulmonary infiltrates suggestive of multilobar pneumonia Respiratory panel positive for RSV Continue IV antibiotics Zosyn day #3 Blood cultures x2 negative to date Continue to wean off BiPAP as tolerated Continue to maintain O2 saturation greater than 92% Home O2 evaluation on 04/14/2018  RSV pneumonia complicated by HCAP Continue Zosyn day #3 Continue to maintain O2 saturation greater than 92% Procalcitonin on 04/14/18 was 1.43 Lactic acid 0.5 Independently reviewed chest x-ray done on admission which revealed mild cardiomegaly with increase in pulmonary vascularity, right lower lobe infiltrates vs right pleural effusion.  AKI on CKD 4 Baseline creatinine 1.86 with GFR of 28 Presented with creatinine of 2.12 Creatinine on 04/14/18 was 1.93 with GFR of 26 Continue to avoid nephrotoxic agents/dehydration/hypotension Monitor urine output Repeat BMP in the morning  Acute on chronic diastolic CHF Presented with elevated BNP Increase in pulmonary vascularity on chest x-ray done in admission Continue Lasix continue to monitor urine output Troponin flat peaked at 0.06 Continue strict I's and O's and daily weight Net I&O negative 245 cc since admission; unclear if accurate.  Uncontrolled hypertension Continue home antihypertensive medication Continue IV Lasix IV hydralazine PRN  Resolved hyperkalemia Potassium 5.3 on 04/12/2018 Potassium on 04/14/2018 was 4.1 Continue to monitor  Type 2 diabetes complicated by CKD Hemoglobin A1c from 03/15/2018 was 6.4 from 7.7 on 12/31/2017 Avoid hypoglycemia Sensitive insulin sliding scale  Physical debility PT OT assessed and recommended SNF CSW consulted for placement Fall precautions    DVT prophylaxis:  Subcu Lovenox daily Code Status: DNR Family Communication:  None at bedside  Consults called: none    Disposition:  Possible discharge to SNF in 2 to 3 days when oxygen requirement is close to baseline.    Objective: Vitals:   04/13/18 2303 04/14/18 0256 04/14/18 0652 04/14/18 0729  BP: 109/71 113/82 107/72 107/72  Pulse: (!) 105 (!) 111  97  Resp: 18 (!) 24  17  Temp: 98 F (36.7 C) 97.9 F (36.6 C)  98.3 F (36.8 C)  TempSrc: Oral Oral  Oral  SpO2: 100% 100%  100%  Weight:      Height:        Intake/Output Summary (Last 24 hours) at 04/14/2018 0910 Last data filed at 04/13/2018 1700 Gross per 24 hour  Intake 350 ml  Output -  Net 350 ml   Filed Weights   04/11/18 0318 04/13/18 0439  Weight: 72.6 kg 74.3 kg    Exam:  . General: 68 y.o. year-old female developed well-nourished in no acute distress.  Alert and oriented x3 on high flow nasal cannula. . Cardiovascular: Regular rate and rhythm with no rubs or gallops.  No JVD or thyromegaly.   [name] Respiratory: Mild rales at bases with no wheezes noted.  Poor inspiratory effort. . Abdomen: Soft nontender nondistended with normal bowel sounds x4 quadrants. . Musculoskeletal: Trace lower extremity edema bilaterally. 2/4 pulses in all 4 extremities. [name] Psychiatry: Mood is appropriate for condition and setting   Data Reviewed: CBC: Recent Labs  Lab 04/11/18 0341 04/11/18 0350 04/11/18 1840 04/12/18 0609  WBC 4.9  --  3.8* 4.1  NEUTROABS 4.0  --   --   --   HGB 7.0* 6.8* 8.7* 9.6*  HCT 22.7* 20.0* 26.7* 29.0*  MCV 96.2  --  90.8 92.4  PLT 220  --  204 250   Basic Metabolic Panel: Recent Labs  Lab 04/11/18 0341 04/11/18 0350 04/11/18 1253 04/12/18 0306 04/13/18 0339 04/14/18 0230  NA 136 136 137 140 141 137  K 5.6* 5.8* 4.9 5.3* 4.5 4.1  CL 105  --  104 107 107 101  CO2 20*  --  23 23 26 28   GLUCOSE 186*  --  148* 88 88 109*  BUN 36*  --  35* 36* 43* 47*  CREATININE 2.13*  --  2.04* 1.86* 2.12* 1.93*  CALCIUM 8.0*  --  8.2* 8.2* 8.0* 7.7*   GFR: Estimated Creatinine Clearance: 29.6 mL/min (A) (by C-G formula based on SCr  of 1.93 mg/dL (H)). Liver Function Tests: Recent Labs  Lab 04/11/18 0341  AST 39  ALT 15  ALKPHOS 47  BILITOT 1.0  PROT 5.9*  ALBUMIN 2.3*   No results for input(s): LIPASE, AMYLASE in the last 168 hours. No results for input(s): AMMONIA in the last 168 hours. Coagulation Profile: No results for input(s): INR, PROTIME in the last 168 hours. Cardiac Enzymes: Recent Labs  Lab 04/11/18 0341 04/11/18 1253 04/11/18 1840  TROPONINI <0.03 0.05* 0.06*   BNP (last 3 results) No results for input(s): PROBNP in the last 8760 hours. HbA1C: No results for input(s): HGBA1C in the last 72 hours. CBG: Recent Labs  Lab 04/12/18 2104 04/13/18 1252 04/13/18 1540 04/13/18 2123 04/14/18 0725  GLUCAP 75 141* 165* 138* 97   Lipid Profile: No results for input(s): CHOL, HDL, LDLCALC, TRIG, CHOLHDL, LDLDIRECT in the last 72 hours. Thyroid Function Tests: No results for input(s): TSH, T4TOTAL, FREET4, T3FREE, THYROIDAB in the last 72 hours. Anemia Panel: No results for input(s): VITAMINB12, FOLATE, FERRITIN, TIBC, IRON, RETICCTPCT in the  last 72 hours. Urine analysis:    Component Value Date/Time   COLORURINE YELLOW 02/25/2018 2316   APPEARANCEUR HAZY (A) 02/25/2018 2316   LABSPEC 1.014 02/25/2018 2316   PHURINE 5.0 02/25/2018 2316   GLUCOSEU NEGATIVE 02/25/2018 2316   HGBUR SMALL (A) 02/25/2018 2316   BILIRUBINUR NEGATIVE 02/25/2018 2316   KETONESUR NEGATIVE 02/25/2018 2316   PROTEINUR >=300 (A) 02/25/2018 2316   UROBILINOGEN 1.0 12/02/2013 1612   NITRITE NEGATIVE 02/25/2018 2316   LEUKOCYTESUR SMALL (A) 02/25/2018 2316   Sepsis Labs: @LABRCNTIP (procalcitonin:4,lacticidven:4)  ) Recent Results (from the past 240 hour(s))  Respiratory Panel by PCR     Status: Abnormal   Collection Time: 04/11/18  4:16 AM  Result Value Ref Range Status   Adenovirus NOT DETECTED NOT DETECTED Final   Coronavirus 229E NOT DETECTED NOT DETECTED Final    Comment: (NOTE) The Coronavirus on the  Respiratory Panel, DOES NOT test for the novel  Coronavirus (2019 nCoV)    Coronavirus HKU1 NOT DETECTED NOT DETECTED Final   Coronavirus NL63 NOT DETECTED NOT DETECTED Final   Coronavirus OC43 NOT DETECTED NOT DETECTED Final   Metapneumovirus NOT DETECTED NOT DETECTED Final   Rhinovirus / Enterovirus NOT DETECTED NOT DETECTED Final   Influenza A NOT DETECTED NOT DETECTED Final   Influenza B NOT DETECTED NOT DETECTED Final   Parainfluenza Virus 1 NOT DETECTED NOT DETECTED Final   Parainfluenza Virus 2 NOT DETECTED NOT DETECTED Final   Parainfluenza Virus 3 NOT DETECTED NOT DETECTED Final   Parainfluenza Virus 4 NOT DETECTED NOT DETECTED Final   Respiratory Syncytial Virus DETECTED (A) NOT DETECTED Final    Comment: CRITICAL RESULT CALLED TO, READ BACK BY AND VERIFIED WITH: RN K COBB [id number] MLM    Bordetella pertussis NOT DETECTED NOT DETECTED Final   Chlamydophila pneumoniae NOT DETECTED NOT DETECTED Final   Mycoplasma pneumoniae NOT DETECTED NOT DETECTED Final    Comment: Performed at [facility], [address], [place], [place] [address]  Blood culture (routine x 2)     Status: None (Preliminary result)   Collection Time: 04/11/18  4:18 AM  Result Value Ref Range Status   Specimen Description BLOOD RIGHT HAND  Final   Special Requests   Final    BOTTLES DRAWN AEROBIC ONLY Blood Culture adequate volume   Culture   Final    NO GROWTH 2 DAYS Performed at [facility], [place] [address] [place], [place] [address]    Report Status PENDING  Incomplete  Blood culture (routine x 2)     Status: None (Preliminary result)   Collection Time: 04/11/18  4:20 AM  Result Value Ref Range Status   Specimen Description BLOOD RIGHT ARM  Final   Special Requests   Final    BOTTLES DRAWN AEROBIC AND ANAEROBIC Blood Culture adequate volume   Culture   Final    NO GROWTH 2 DAYS Performed at [facility], [place] [address], [place], [place] [address]    Report Status PENDING   Incomplete      Studies: No results found.  Scheduled Meds: . atorvastatin  80 mg Oral QHS  . busPIRone  15 mg Oral BID  . carbamide peroxide  5 drop Right EAR BID  . divalproex  1,000 mg Oral Daily  . enoxaparin (LOVENOX) injection  30 mg Subcutaneous Q24H  . furosemide  20 mg Intravenous BID  . hydrALAZINE  100 mg Oral Q8H  . insulin aspart  0-5 Units Subcutaneous QHS  . insulin aspart  0-9 Units Subcutaneous TID WC  . insulin glargine  10 Units Subcutaneous QHS  . isosorbide mononitrate  30 mg Oral Daily  . levothyroxine  200 mcg Oral Daily  . pantoprazole  40 mg Oral Daily  . sodium chloride flush  3 mL Intravenous Q12H  . sodium chloride flush  3 mL Intravenous Q12H    Continuous Infusions: . sodium chloride    . piperacillin-tazobactam (ZOSYN)  IV 3.375 g (04/14/18 0651)     LOS: 3 days     Kayleen Memos, MD [facility] Pager [phone]  If 7PM-7AM, please contact night-coverage [email or web address] Password [email or web address] 04/14/2018, 9:10 AM

## 2018-04-14 NOTE — Progress Notes (Signed)
Pt uncooperative in obtaining her BP prior to giving her her Hyralazine.  Coughing on staff.  When mentiomomg tp her not to cough on staff she attempted to get closer to cough on staff.  When trying to give her her meds she became hostile but did take her medicine.

## 2018-04-15 LAB — BASIC METABOLIC PANEL
Anion gap: 5 (ref 5–15)
BUN: 49 mg/dL — ABNORMAL HIGH (ref 8–23)
CO2: 28 mmol/L (ref 22–32)
Calcium: 7.6 mg/dL — ABNORMAL LOW (ref 8.9–10.3)
Chloride: 101 mmol/L (ref 98–111)
Creatinine, Ser: 2.02 mg/dL — ABNORMAL HIGH (ref 0.44–1.00)
GFR calc Af Amer: 29 mL/min — ABNORMAL LOW (ref >60)
GFR calc non Af Amer: 25 mL/min — ABNORMAL LOW (ref >60)
Glucose, Bld: 103 mg/dL — ABNORMAL HIGH (ref 70–99)
Potassium: 3.7 mmol/L (ref 3.5–5.1)
Sodium: 134 mmol/L — ABNORMAL LOW (ref 135–145)

## 2018-04-15 LAB — GLUCOSE, CAPILLARY
GLUCOSE-CAPILLARY: 85 mg/dL (ref 70–99)
Glucose-Capillary: 172 mg/dL — ABNORMAL HIGH (ref 70–99)
Glucose-Capillary: 110 mg/dL — ABNORMAL HIGH (ref 70–99)
Glucose-Capillary: 133 mg/dL — ABNORMAL HIGH (ref 70–99)

## 2018-04-15 NOTE — Progress Notes (Signed)
PROGRESS NOTE  Andrea [name] QVZ:[id number] DOB: Dec 15, 1950 DOA: 04/11/2018 PCP: Medicine, [facility]  HPI/Recap of past 24 hours: 3yow from [facility] diastolic CHF, 3VCAD, ischemic cardiomyopathy presented via EMS with resp distress, hypoxia tachypnea, rales, "fluid on her". Treated with BiPAP and then weaned to 3L [place]  Reports leg edema for more than a week. Sudden SOB last night with severe chest pressure, no specific a/a factors. No diaphoresis. Had vomiting, no diarrhea. No fever or infectious symptoms. Treated with Lasix alone in ED with substantial improvement. RSV positive.  I called [facility] for more hx. Went through phone tree. No answer.  Hospitalized 2/23 - 3/6 for CHF, AKI, confusion from narcotics  COVID SCREEN Fever: NO  Cough: YES   SOB: YES URI symptoms: NO GI symptoms: YES vomiting Travel: NO--in NH 2 weeks and prior was in hospital. No one traveled to her [place] contacts: NO  ED Course: Lasix and [facility] course complicated by persistent hypoxia requiring noninvasive mechanical ventilation BiPAP.  Weaned off to high flow nasal cannula on 04/13/2018.  04/15/18: Patient seen and examined at bedside.  No acute events overnight.  States she is breathing better and her cough is improving.  She is on day number 4 out of 5 of Zosyn.  Home O2 evaluation ordered and pending. Not on oxygen supplementation at baseline.  Plan for discharge tomorrow to SNF on 04/16/2018 after she completes her course of IV antibiotic and her O2 requirement is close to baseline.  Last procalcitonin was 1.43 on 04/14/2018 which is still significantly elevated.     Assessment/Plan: Principal Problem:   Acute hypoxemic respiratory failure (HCC) Active Problems:   Chronic pain   Chest pain   Acute respiratory failure with hypoxia (HCC)   Anemia   Acute on chronic diastolic CHF (congestive heart failure) (HCC)   RSV (respiratory syncytial virus  pneumonia)   DM type 2 (diabetes mellitus, type 2) (HCC)   CKD (chronic kidney disease), stage IV (HCC)  Improving acute hypoxic respiratory failure multifactorial secondary to RSV pneumonia versus acute on chronic diastolic CHF Presented with hypoxia, tachycardia, tachypnea placed on BiPAP Independently reviewed chest x-ray done on admission which revealed bilateral pulmonary infiltrates suggestive of multilobar pneumonia Respiratory panel positive for RSV Continue IV antibiotics Zosyn day #4/5 Blood cultures x2 negative to date Continue to wean off BiPAP and oxygen supplementation as tolerated Home O2 evaluation ordered on 04/14/18 still pending Maintain O2 saturation greater than 92%  RSV pneumonia complicated by HCAP Continue Zosyn day #4/5 Continue to maintain O2 saturation greater than 92% Procalcitonin on 04/14/18 was 1.43 Lactic acid 0.5 Independently reviewed chest x-ray done on admission which revealed mild cardiomegaly with increase in pulmonary vascularity, right lower lobe infiltrates vs right pleural effusion.  Worsening AKI on CKD 4 Baseline creatinine 1.86 with GFR of 28 Presented with creatinine of 2.12 Creatinine on 04/15/2018 was 2.02 from 1.93 yesterday Currently on IV Lasix 20 mg twice daily, switch to oral on 04/15/2018 Continue to avoid nephrotoxic agents Continue to monitor urine output and repeat BMP in the morning  Acute on chronic diastolic CHF Presented with elevated BNP Increase in pulmonary vascularity on chest x-ray done in admission Continue Lasix continue to monitor urine output Troponin flat peaked at 0.06 Continue strict I's and O's and daily weight  Resolved uncontrolled hypertension Blood pressure is currently normotensive Continue home medications p.o. hydralazine and p.o. Lasix  Resolved hyperkalemia Potassium 5.3 on 04/12/2018 Repeated potassium on  04/15/2018 was 3.7  Type 2 diabetes complicated by CKD Hemoglobin A1c from 03/15/2018 was  6.4 from 7.7 on 12/31/2017 Avoid hypoglycemia Continue sensitive insulin sliding scale  Physical debility PT OT assessed and recommended SNF CSW consulted for placement Fall precautions Stable discharge tomorrow 04/16/2018 to SNF if bed is available    DVT prophylaxis:  Subcu Lovenox daily Code Status: DNR Family Communication:  None at bedside Consults called: none    Disposition: Possible discharge to SNF tomorrow 04/16/2018 if bed is available.   Objective: Vitals:   04/15/18 0416 04/15/18 0521 04/15/18 0617 04/15/18 0716  BP:   109/75 136/61  Pulse:    95  Resp:    (!) 22  Temp:    97.9 F (36.6 C)  TempSrc:    Oral  SpO2:  99% 95% 91%  Weight: 77.7 kg     Height:        Intake/Output Summary (Last 24 hours) at 04/15/2018 0931 Last data filed at 04/15/2018 0353 Gross per 24 hour  Intake 1080 ml  Output 1001 ml  Net 79 ml   Filed Weights   04/11/18 0318 04/13/18 0439 04/15/18 0416  Weight: 72.6 kg 74.3 kg 77.7 kg    Exam:  . General: 68 y.o. year-old female well-developed well-nourished no acute distress.  Alert and oriented x3.   . Cardiovascular: Regular rate and rhythm with no rubs or gallops.  No JVD or thyromegaly . Respiratory: Mild rales at bases with no wheezes.  Poor inspiratory effort.  . Abdomen: Soft nontender nondistended with normal bowel sounds x4 quadrants. . Musculoskeletal: Trace lower extremity edema bilaterally. 2/4 pulses in all 4 extremities. [name] Psychiatry: Mood is appropriate for condition and setting   Data Reviewed: CBC: Recent Labs  Lab 04/11/18 0341 04/11/18 0350 04/11/18 1840 04/12/18 0609  WBC 4.9  --  3.8* 4.1  NEUTROABS 4.0  --   --   --   HGB 7.0* 6.8* 8.7* 9.6*  HCT 22.7* 20.0* 26.7* 29.0*  MCV 96.2  --  90.8 92.4  PLT 220  --  204 086   Basic Metabolic Panel: Recent Labs  Lab 04/11/18 1253 04/12/18 0306 04/13/18 0339 04/14/18 0230 04/15/18 0320  NA 137 140 141 137 134*  K 4.9 5.3* 4.5 4.1 3.7  CL 104 107  107 101 101  CO2 23 23 26 28 28   GLUCOSE 148* 88 88 109* 103*  BUN 35* 36* 43* 47* 49*  CREATININE 2.04* 1.86* 2.12* 1.93* 2.02*  CALCIUM 8.2* 8.2* 8.0* 7.7* 7.6*   GFR: Estimated Creatinine Clearance: 28.8 mL/min (A) (by C-G formula based on SCr of 2.02 mg/dL (H)). Liver Function Tests: Recent Labs  Lab 04/11/18 0341  AST 39  ALT 15  ALKPHOS 47  BILITOT 1.0  PROT 5.9*  ALBUMIN 2.3*   No results for input(s): LIPASE, AMYLASE in the last 168 hours. No results for input(s): AMMONIA in the last 168 hours. Coagulation Profile: No results for input(s): INR, PROTIME in the last 168 hours. Cardiac Enzymes: Recent Labs  Lab 04/11/18 0341 04/11/18 1253 04/11/18 1840  TROPONINI <0.03 0.05* 0.06*   BNP (last 3 results) No results for input(s): PROBNP in the last 8760 hours. HbA1C: No results for input(s): HGBA1C in the last 72 hours. CBG: Recent Labs  Lab 04/13/18 2123 04/14/18 0725 04/14/18 1203 04/14/18 1602 04/14/18 2028  GLUCAP 138* 97 109* 94 125*   Lipid Profile: No results for input(s): CHOL, HDL, LDLCALC, TRIG, CHOLHDL, LDLDIRECT in the last  72 hours. Thyroid Function Tests: No results for input(s): TSH, T4TOTAL, FREET4, T3FREE, THYROIDAB in the last 72 hours. Anemia Panel: No results for input(s): VITAMINB12, FOLATE, FERRITIN, TIBC, IRON, RETICCTPCT in the last 72 hours. Urine analysis:    Component Value Date/Time   COLORURINE YELLOW 02/25/2018 2316   APPEARANCEUR HAZY (A) 02/25/2018 2316   LABSPEC 1.014 02/25/2018 2316   PHURINE 5.0 02/25/2018 2316   GLUCOSEU NEGATIVE 02/25/2018 2316   HGBUR SMALL (A) 02/25/2018 2316   BILIRUBINUR NEGATIVE 02/25/2018 2316   KETONESUR NEGATIVE 02/25/2018 2316   PROTEINUR >=300 (A) 02/25/2018 2316   UROBILINOGEN 1.0 12/02/2013 1612   NITRITE NEGATIVE 02/25/2018 2316   LEUKOCYTESUR SMALL (A) 02/25/2018 2316   Sepsis Labs: @LABRCNTIP (procalcitonin:4,lacticidven:4)  ) Recent Results (from the past 240 hour(s))   Respiratory Panel by PCR     Status: Abnormal   Collection Time: 04/11/18  4:16 AM  Result Value Ref Range Status   Adenovirus NOT DETECTED NOT DETECTED Final   Coronavirus 229E NOT DETECTED NOT DETECTED Final    Comment: (NOTE) The Coronavirus on the Respiratory Panel, DOES NOT test for the novel  Coronavirus (2019 nCoV)    Coronavirus HKU1 NOT DETECTED NOT DETECTED Final   Coronavirus NL63 NOT DETECTED NOT DETECTED Final   Coronavirus OC43 NOT DETECTED NOT DETECTED Final   Metapneumovirus NOT DETECTED NOT DETECTED Final   Rhinovirus / Enterovirus NOT DETECTED NOT DETECTED Final   Influenza A NOT DETECTED NOT DETECTED Final   Influenza B NOT DETECTED NOT DETECTED Final   Parainfluenza Virus 1 NOT DETECTED NOT DETECTED Final   Parainfluenza Virus 2 NOT DETECTED NOT DETECTED Final   Parainfluenza Virus 3 NOT DETECTED NOT DETECTED Final   Parainfluenza Virus 4 NOT DETECTED NOT DETECTED Final   Respiratory Syncytial Virus DETECTED (A) NOT DETECTED Final    Comment: CRITICAL RESULT CALLED TO, READ BACK BY AND VERIFIED WITH: RN K COBB [id number] MLM    Bordetella pertussis NOT DETECTED NOT DETECTED Final   Chlamydophila pneumoniae NOT DETECTED NOT DETECTED Final   Mycoplasma pneumoniae NOT DETECTED NOT DETECTED Final    Comment: Performed at [facility], [address], [place], [place] [address]  Blood culture (routine x 2)     Status: None (Preliminary result)   Collection Time: 04/11/18  4:18 AM  Result Value Ref Range Status   Specimen Description BLOOD RIGHT HAND  Final   Special Requests   Final    BOTTLES DRAWN AEROBIC ONLY Blood Culture adequate volume   Culture   Final    NO GROWTH 3 DAYS Performed at [facility], [place] [address] [place], [place] [address]    Report Status PENDING  Incomplete  Blood culture (routine x 2)     Status: None (Preliminary result)   Collection Time: 04/11/18  4:20 AM  Result Value Ref Range Status   Specimen Description BLOOD  RIGHT ARM  Final   Special Requests   Final    BOTTLES DRAWN AEROBIC AND ANAEROBIC Blood Culture adequate volume   Culture   Final    NO GROWTH 3 DAYS Performed at [facility], [address], [place], [place] [address]    Report Status PENDING  Incomplete  Expectorated sputum assessment w rflx to resp cult     Status: None   Collection Time: 04/14/18  6:30 AM  Result Value Ref Range Status   Specimen Description SPUTUM  Final   Special Requests Normal  Final  Sputum evaluation   Final    THIS SPECIMEN IS ACCEPTABLE FOR SPUTUM CULTURE Performed at [facility], [place] [address], [place], [place] [address]    Report Status 04/14/2018 FINAL  Final  Culture, respiratory     Status: None (Preliminary result)   Collection Time: 04/14/18  6:30 AM  Result Value Ref Range Status   Specimen Description SPUTUM  Final   Special Requests Normal Reflexed from [id number]  Final   Gram Stain   Final    MODERATE WBC PRESENT,BOTH PMN AND MONONUCLEAR ABUNDANT YEAST WITH PSEUDOHYPHAE    Culture   Final    CULTURE REINCUBATED FOR BETTER GROWTH Performed at [facility], [place] [address], [place], [place] [address]    Report Status PENDING  Incomplete      Studies: No results found.  Scheduled Meds: . atorvastatin  80 mg Oral QHS  . busPIRone  15 mg Oral BID  . carbamide peroxide  5 drop Right EAR BID  . divalproex  1,000 mg Oral Daily  . enoxaparin (LOVENOX) injection  30 mg Subcutaneous Q24H  . furosemide  20 mg Intravenous BID  . hydrALAZINE  100 mg Oral Q8H  . insulin aspart  0-5 Units Subcutaneous QHS  . insulin aspart  0-9 Units Subcutaneous TID WC  . insulin glargine  10 Units Subcutaneous QHS  . isosorbide mononitrate  30 mg Oral Daily  . levothyroxine  200 mcg Oral Daily  . pantoprazole  40 mg Oral Daily  . sodium chloride flush  3 mL Intravenous Q12H  . sodium chloride flush  3 mL Intravenous Q12H    Continuous Infusions: . sodium chloride    .  piperacillin-tazobactam (ZOSYN)  IV 3.375 g (04/15/18 0505)     LOS: 4 days     Kayleen Memos, MD [facility] Pager [phone]  If 7PM-7AM, please contact night-coverage [email or web address] Password [facility] 04/15/2018, 9:31 AM

## 2018-04-15 NOTE — Progress Notes (Signed)
Physical Therapy Treatment Patient Details Name: Andrea [name] MRN: [id number] DOB: 12/01/1950 Today's Date: 04/15/2018    History of Present Illness 15yow from [facility] PMHdiastolic CHF, 3VCAD, ischemiccardiomyopathypresented via EMS with resp distress, hypoxia tachypnea, rales, "fluid on her". Treated with BiPAP and then weaned to 3L [place]    PT Comments    Patient progressing with therapy today, tolerating more activity. Standing EOB, side stepping, and talking short steps in room 5-10' total before premature sitting and fatigue. Will cont to progress as able. Cont to rec SNF at this time.     Follow Up Recommendations  SNF;Supervision/Assistance - 24 hour     Equipment Recommendations  (TBD next venue)    Recommendations for Other Services       Precautions / Restrictions Precautions Precautions: Fall Precaution Comments: Watch SpO2 and BP  Restrictions Weight Bearing Restrictions: No Other Position/Activity Restrictions: pt having hard time breathing - on BIPAP and O2 sats 99% but pt weak and had hard time participating. HR 90-100.      Mobility  Bed Mobility Overal bed mobility: Needs Assistance Bed Mobility: Supine to Sit;Sit to Supine     Supine to sit: Supervision Sit to supine: Supervision      Transfers Overall transfer level: Needs assistance Equipment used: 1 person hand held assist Transfers: Sit to/from [employer] Transfers Sit to Stand: Min assist         General transfer comment: patient standing with unsteadiness, able to side step then take several steps in room back to bed premature sit. VSS   Ambulation/Gait                 Stairs             Wheelchair Mobility    Modified Rankin (Stroke Patients Only)       Balance Overall balance assessment: Needs assistance Sitting-balance support: No upper extremity supported;Feet supported Sitting balance-Leahy Scale: Good       Standing balance-Leahy  Scale: Poor                              Cognition Arousal/Alertness: Lethargic Behavior During Therapy: Flat affect Overall Cognitive Status: No family/caregiver present to determine baseline cognitive functioning                                 General Comments: following cues, slow processing at times      Exercises      General Comments        Pertinent Vitals/Pain Pain Assessment: No/denies pain    Home Living                      Prior Function            PT Goals (current goals can now be found in the care plan section) Acute Rehab PT Goals Patient Stated Goal: pt wants to go home -she knows she will needs lots of help to get strong enough to do this PT Goal Formulation: With patient Time For Goal Achievement: 04/26/18 Potential to Achieve Goals: Fair Progress towards PT goals: Progressing toward goals    Frequency    Min 2X/week      PT Plan Current plan remains appropriate    Co-evaluation              AM-PAC PT "6 Clicks" Mobility  Outcome Measure  Help needed turning from your back to your side while in a flat bed without using bedrails?: A Little Help needed moving from lying on your back to sitting on the side of a flat bed without using bedrails?: A Little Help needed moving to and from a bed to a chair (including a wheelchair)?: A Lot Help needed standing up from a chair using your arms (e.g., wheelchair or bedside chair)?: A Lot Help needed to walk in hospital room?: Total Help needed climbing 3-5 steps with a railing? : Total 6 Click Score: 12    End of Session Equipment Utilized During Treatment: Oxygen Activity Tolerance: Patient limited by fatigue;Treatment limited secondary to medical complications (Comment);Patient limited by lethargy Patient left: in bed;with call bell/phone within reach;with bed alarm set Nurse Communication: Mobility status PT Visit Diagnosis: Muscle weakness  (generalized) (M62.81);Unsteadiness on feet (R26.81)     Time: 7471-5953 PT Time Calculation (min) (ACUTE ONLY): 11 min  Charges:  $Therapeutic Activity: 8-22 mins                     Reinaldo Berber, PT, DPT Acute Rehabilitation Services Pager: [phone] Office: [phone]    Reinaldo Berber 04/15/2018, 9:58 AM

## 2018-04-15 NOTE — Progress Notes (Signed)
Physical Therapy Treatment Patient Details Name: Andrea [name] MRN: [id number] DOB: 08/01/1950 Today's Date: 04/15/2018    History of Present Illness 42yow from [facility] PMHdiastolic CHF, 3VCAD, ischemiccardiomyopathypresented via EMS with resp distress, hypoxia tachypnea, rales, "fluid on her". Treated with BiPAP and then weaned to 3L [place]    PT Comments    Patient assisted to chair for peri care, tolerating ambulating in room short distances on 4L without desat. Patient will continue to improve with more OOB mobility.  Currently contact guard- min A at most for safe gait. rec chair follow.     Follow Up Recommendations  SNF;Supervision/Assistance - 24 hour     Equipment Recommendations  (TBD next venue)    Recommendations for Other Services       Precautions / Restrictions Precautions Precautions: Fall Precaution Comments: Watch SpO2 and BP  Restrictions Weight Bearing Restrictions: No Other Position/Activity Restrictions: pt having hard time breathing - on BIPAP and O2 sats 99% but pt weak and had hard time participating. HR 90-100.      Mobility  Bed Mobility Overal bed mobility: Needs Assistance Bed Mobility: Supine to Sit;Sit to Supine     Supine to sit: Supervision Sit to supine: Supervision      Transfers Overall transfer level: Needs assistance Equipment used: 1 person hand held assist Transfers: Sit to/from [employer] Transfers Sit to Stand: Min assist         General transfer comment: patient standing with unsteadiness, able to side step then take several steps in room back to bed premature sit. VSS   Ambulation/Gait Ambulation/Gait assistance: Min guard Gait Distance (Feet): 50 Feet Assistive device: 1 person hand held assist Gait Pattern/deviations: Step-through pattern;Decreased stride length;Drifts right/left;Shuffle Gait velocity: decreased   General Gait Details: ambualting on 4L, VSS. short distance. unsteady. short step  length.    Stairs             Wheelchair Mobility    Modified Rankin (Stroke Patients Only)       Balance Overall balance assessment: Needs assistance Sitting-balance support: No upper extremity supported;Feet supported Sitting balance-Leahy Scale: Good       Standing balance-Leahy Scale: Poor                              Cognition Arousal/Alertness: Lethargic Behavior During Therapy: Flat affect Overall Cognitive Status: No family/caregiver present to determine baseline cognitive functioning                                 General Comments: following cues, slow processing at times      Exercises      General Comments        Pertinent Vitals/Pain Pain Assessment: No/denies pain    Home Living                      Prior Function            PT Goals (current goals can now be found in the care plan section) Acute Rehab PT Goals Patient Stated Goal: pt wants to go home -she knows she will needs lots of help to get strong enough to do this PT Goal Formulation: With patient Time For Goal Achievement: 04/26/18 Potential to Achieve Goals: Fair    Frequency    Min 2X/week      PT Plan Current plan remains appropriate  Co-evaluation              AM-PAC PT "6 Clicks" Mobility   Outcome Measure  Help needed turning from your back to your side while in a flat bed without using bedrails?: A Little Help needed moving from lying on your back to sitting on the side of a flat bed without using bedrails?: A Little Help needed moving to and from a bed to a chair (including a wheelchair)?: A Lot Help needed standing up from a chair using your arms (e.g., wheelchair or bedside chair)?: A Lot Help needed to walk in hospital room?: Total Help needed climbing 3-5 steps with a railing? : Total 6 Click Score: 12    End of Session Equipment Utilized During Treatment: Oxygen Activity Tolerance: Patient limited by  fatigue;Treatment limited secondary to medical complications (Comment);Patient limited by lethargy Patient left: in bed;with call bell/phone within reach;with bed alarm set Nurse Communication: Mobility status PT Visit Diagnosis: Muscle weakness (generalized) (M62.81);Unsteadiness on feet (R26.81)     Time: 1700-1710 PT Time Calculation (min) (ACUTE ONLY): 10 min  Charges:  $Gait Training: 8-22 mins                     Reinaldo Berber, PT, DPT Acute Rehabilitation Services Pager: [phone] Office: [phone]     Reinaldo Berber 04/15/2018, 4:58 PM

## 2018-04-16 ENCOUNTER — Inpatient Hospital Stay (HOSPITAL_COMMUNITY): Payer: Medicare Other

## 2018-04-16 LAB — CULTURE, BLOOD (ROUTINE X 2)
Culture: NO GROWTH
Culture: NO GROWTH
Special Requests: ADEQUATE
Special Requests: ADEQUATE

## 2018-04-16 LAB — GLUCOSE, CAPILLARY
Glucose-Capillary: 62 mg/dL — ABNORMAL LOW (ref 70–99)
Glucose-Capillary: 79 mg/dL (ref 70–99)
Glucose-Capillary: 80 mg/dL (ref 70–99)
Glucose-Capillary: 125 mg/dL — ABNORMAL HIGH (ref 70–99)
Glucose-Capillary: 141 mg/dL — ABNORMAL HIGH (ref 70–99)

## 2018-04-16 LAB — BASIC METABOLIC PANEL
ANION GAP: 8 (ref 5–15)
BUN: 46 mg/dL — ABNORMAL HIGH (ref 8–23)
CO2: 28 mmol/L (ref 22–32)
Calcium: 8 mg/dL — ABNORMAL LOW (ref 8.9–10.3)
Chloride: 103 mmol/L (ref 98–111)
Creatinine, Ser: 1.95 mg/dL — ABNORMAL HIGH (ref 0.44–1.00)
GFR calc Af Amer: 30 mL/min — ABNORMAL LOW (ref >60)
GFR calc non Af Amer: 26 mL/min — ABNORMAL LOW (ref >60)
GLUCOSE: 69 mg/dL — AB (ref 70–99)
Potassium: 3.4 mmol/L — ABNORMAL LOW (ref 3.5–5.1)
SODIUM: 139 mmol/L (ref 135–145)

## 2018-04-16 MED ORDER — CIPROFLOXACIN-DEXAMETHASONE 0.3-0.1 % OT SUSP
4.0000 [drp] | Freq: Two times a day (BID) | OTIC | Status: AC
  Administered 2018-04-16 – 2018-04-21 (×10): 4 [drp] via OTIC
  Filled 2018-04-16: qty 7.5

## 2018-04-16 MED ORDER — FUROSEMIDE 10 MG/ML IJ SOLN
20.0000 mg | Freq: Once | INTRAMUSCULAR | Status: AC
  Administered 2018-04-16: 20 mg via INTRAVENOUS
  Filled 2018-04-16: qty 2

## 2018-04-16 MED ORDER — FUROSEMIDE 10 MG/ML IJ SOLN
60.0000 mg | Freq: Two times a day (BID) | INTRAMUSCULAR | Status: DC
  Administered 2018-04-16 – 2018-04-18 (×4): 60 mg via INTRAVENOUS
  Filled 2018-04-16 (×4): qty 6

## 2018-04-16 MED ORDER — ENSURE ENLIVE PO LIQD
237.0000 mL | Freq: Two times a day (BID) | ORAL | Status: DC
  Administered 2018-04-16 – 2018-04-17 (×3): 237 mL via ORAL

## 2018-04-16 NOTE — Progress Notes (Signed)
Occupational Therapy Treatment Patient Details Name: Andrea [name] MRN: [id number] DOB: [birth year] Today's Date: 04/16/2018    History of present illness 28yow from [facility] PMHdiastolic CHF, 3VCAD, ischemiccardiomyopathypresented via EMS with resp distress, hypoxia tachypnea, rales, "fluid on her". Treated with BiPAP and now weaned to [facility].   OT comments  Pt performing sit to stand with minguardA and transfers to commode with minguardA. Pt with overall poor activity tolerance for standing at sink <1 min prior to requiring seated rest break and seated grooming for remainder of session. Pt's bed  Mobility supervisionA level and pt requires cues to slow down as lines/tubes are requiring management prior to movement. Pt performing tasks with 2L O2 >90% upon exertion. Pt tolerating session fair. Upgraded to 3x/week for increasing ADL, mobility and activity tolerance. Pt to be followed acutely.    Follow Up Recommendations  SNF;Supervision/Assistance - 24 hour    Equipment Recommendations  Other (comment)(to be determined)    Recommendations for Other Services      Precautions / Restrictions Precautions Precautions: Fall Precaution Comments: Watch SpO2 and BP  Restrictions Weight Bearing Restrictions: No Other Position/Activity Restrictions: 2L O2 >90% and HR <130 BPm with activity.       Mobility Bed Mobility Overal bed mobility: Needs Assistance Bed Mobility: Supine to Sit;Sit to Supine Rolling: Supervision   Supine to sit: Supervision Sit to supine: Supervision   General bed mobility comments: supervisionA   Transfers Overall transfer level: Needs assistance Equipment used: Rolling walker (2 wheeled) Transfers: Sit to/from [employer] Sit to Stand: Min guard         General transfer comment: poor activity tolerance in standing.    Balance Overall balance assessment: Needs assistance   Sitting balance-Leahy Scale: Good        Standing balance-Leahy Scale: Fair Standing balance comment: BUEs on external support               High Level Balance Comments: tolerating fair with RW           ADL either performed or assessed with clinical judgement   ADL Overall ADL's : Needs assistance/impaired     Grooming: Set up;Wash/dry hands;Wash/dry face;Sitting                   Toilet Transfer: Runner, broadcasting/film/video and Hygiene: Minimal assistance;Sitting/lateral lean;Sit to/from stand       Functional mobility during ADLs: Min guard;Rolling walker General ADL Comments: Pt tolerating ADLs well at sink mostly in sitting due to poor activity tolerance in standing. Pt performing ADL functional mobility with minguardA today. Pt requiring motivational cues to continue to perform tasks,     Vision       Perception     Praxis      Cognition Arousal/Alertness: Lethargic Behavior During Therapy: Flat affect Overall Cognitive Status: No family/caregiver present to determine baseline cognitive functioning                                 General Comments: following cues, slow processing at times        Exercises     Shoulder Instructions       General Comments      Pertinent Vitals/ Pain       Pain Assessment: No/denies pain  Home Living  Prior Functioning/Environment              Frequency  Min 3X/week        Progress Toward Goals  OT Goals(current goals can now be found in the care plan section)  Progress towards OT goals: Progressing toward goals  Acute Rehab OT Goals Patient Stated Goal: to go home OT Goal Formulation: With patient Time For Goal Achievement: 04/27/18 Potential to Achieve Goals: Fair ADL Goals Pt Will Perform Grooming: with modified independence;sitting Pt Will Perform Upper Body Bathing: with supervision;sitting Pt Will Perform Lower  Body Bathing: with supervision;sit to/from stand Pt Will Perform Upper Body Dressing: with supervision;sitting Pt Will Perform Lower Body Dressing: with min guard assist;sitting/lateral leans Pt Will Transfer to Toilet: with supervision;ambulating Pt Will Perform Toileting - Clothing Manipulation and hygiene: sit to/from stand Pt/caregiver will Perform Home Exercise Program: Increased strength;Both right and left upper extremity;With Supervision Additional ADL Goal #1: Pt will recall 3 energy conservation strategies to utilize during ADL tasks at supervision level. Additional ADL Goal #2: Pt will recall 2 ways to manage edema at supervision level  Plan Discharge plan remains appropriate    Co-evaluation                 AM-PAC OT "6 Clicks" Daily Activity     Outcome Measure   Help from another person eating meals?: None Help from another person taking care of personal grooming?: A Little Help from another person toileting, which includes using toliet, bedpan, or urinal?: A Lot Help from another person bathing (including washing, rinsing, drying)?: A Lot Help from another person to put on and taking off regular upper body clothing?: A Little Help from another person to put on and taking off regular lower body clothing?: A Lot 6 Click Score: 16    End of Session Equipment Utilized During Treatment: Oxygen  OT Visit Diagnosis: Muscle weakness (generalized) (M62.81);Adult, failure to thrive (R62.7)   Activity Tolerance Patient limited by fatigue;Patient tolerated treatment well   Patient Left in bed;with call bell/phone within reach;with bed alarm set;with nursing/sitter in room   Nurse Communication Mobility status        Time: 3888-7579 OT Time Calculation (min): 25 min  Charges: OT General Charges $OT Visit: 1 Visit OT Treatments $Self Care/Home Management : 23-37 mins  Ebony Hail Harold Hedge) Marsa Aris OTR/L Acute Rehabilitation Services Pager: [phone] Office:  [phone]    Fredda Hammed 04/16/2018, 10:16 AM

## 2018-04-16 NOTE — Progress Notes (Signed)
04/16/18 1302  PT Visit Information  Last PT Received On 04/16/18  Assistance Needed +1  History of Present Illness 67yow from [facility] PMHdiastolic CHF, 3VCAD, ischemiccardiomyopathypresented via EMS with resp distress, hypoxia tachypnea, rales, "fluid on her". Treated with BiPAP and now weaned to [facility].  Subjective Data  Patient Stated Goal to feel better  Precautions  Precautions Fall  Precaution Comments Watch SpO2 and BP   Restrictions  Weight Bearing Restrictions No  Pain Assessment  Pain Assessment Faces  Faces Pain Scale 4  Pain Location back  Pain Descriptors / Indicators Aching  Pain Intervention(s) Limited activity within patient's tolerance;Monitored during session;Repositioned  Cognition  Arousal/Alertness Awake/alert  Behavior During Therapy Flat affect  Overall Cognitive Status No family/caregiver present to determine baseline cognitive functioning  General Comments Slow processing noted  Bed Mobility  Overal bed mobility Needs Assistance  Bed Mobility Sit to Supine  Sit to supine Supervision  General bed mobility comments Supervision for line management   Transfers  Overall transfer level Needs assistance  Equipment used Rolling walker (2 wheeled)  Transfers Sit to/from Stand  Sit to Stand Min guard  General transfer comment Min guard for steadying assist to stand. Demonstrated safe hand placement.   Ambulation/Gait  Ambulation/Gait assistance Min guard  Gait Distance (Feet) 15 Feet  Assistive device Rolling walker (2 wheeled)  Gait Pattern/deviations Step-through pattern;Decreased stride length  General Gait Details Slow, very guarded gait. Pt fatiguing easily and reports nausea, therefore distance limited. Cues for sequencing using RW. Pt with episode of vomiting following gait; RN notified.   Gait velocity Decreased   Balance  Overall balance assessment Needs assistance  Sitting-balance support No upper extremity supported;Feet supported  Sitting  balance-Leahy Scale Good  Standing balance support Bilateral upper extremity supported  Standing balance-Leahy Scale Poor  Standing balance comment Reliant on BUE support   Exercises  Exercises General Lower Extremity  General Exercises - Lower Extremity  Ankle Circles/Pumps AROM;Both;10 reps  Long Arc Quad AROM;Both;10 reps;Seated  PT - End of Session  Equipment Utilized During Treatment Gait belt;Oxygen  Activity Tolerance Patient limited by fatigue;Treatment limited secondary to medical complications (Comment) (nausea/vomiting)  Patient left in bed;with call bell/phone within reach  Nurse Communication Mobility status   PT - Assessment/Plan  PT Plan Current plan remains appropriate  PT Visit Diagnosis Muscle weakness (generalized) (M62.81);Unsteadiness on feet (R26.81)  PT Frequency (ACUTE ONLY) Min 2X/week  Follow Up Recommendations SNF;Supervision/Assistance - 24 hour  PT equipment Other (comment) (TBD)  AM-PAC PT "6 Clicks" Mobility Outcome Measure (Version 2)  Help needed turning from your back to your side while in a flat bed without using bedrails? 3  Help needed moving from lying on your back to sitting on the side of a flat bed without using bedrails? 3  Help needed moving to and from a bed to a chair (including a wheelchair)? 2  Help needed standing up from a chair using your arms (e.g., wheelchair or bedside chair)? 3  Help needed to walk in hospital room? 3  Help needed climbing 3-5 steps with a railing?  1  6 Click Score 15  Consider Recommendation of Discharge To: CIR/SNF/LTACH  PT Goal Progression  Progress towards PT goals Progressing toward goals  Acute Rehab PT Goals  PT Goal Formulation With patient  Time For Goal Achievement 04/26/18  Potential to Achieve Goals Fair  PT Time Calculation  PT Start Time (ACUTE ONLY) 1239  PT Stop Time (ACUTE ONLY) 1256  PT Time  Calculation (min) (ACUTE ONLY) 17 min  PT General Charges  $$ ACUTE PT VISIT 1 Visit  PT  Treatments  $Therapeutic Activity 8-22 mins    Pt with slow progression towards goals. Pt limited secondary to fatigue and n/v. Notified RN. Required min guard A with use of RW for short distance ambulation. Reviewed seated HEP with pt. Current recommendations appropriate. Will continue to follow acutely to maximize functional mobility independence and safety.    Leighton Ruff, PT, DPT  Acute Rehabilitation Services  Pager: [phone] Office: [phone]

## 2018-04-16 NOTE — Progress Notes (Signed)
PROGRESS NOTE  Autie Vasudevan KZS:[id number] DOB: Apr 09, 1950 DOA: 04/11/2018 PCP: [facility]  HPI/Recap of past 24 hours: Patient is a 68 year old female, from [facility], with past medical history significant for diastolic CHF, 3VCAD and ischemic cardiomyopathy.  Patient presented with with resp distress with sudden worsening of shortness of breath, chest pressure and leg edema.  RSV positive.  Patient has required noninvasive mechanical ventilation (BiPAP) and diuresis.  Significantly, patient was hospitalized from 2/23 - 3/6 for CHF, AKI, confusion from narcotics.  04/16/2018: Patient reports right earache.  Reports chills with no fever.  Report called as productive for brownish sputum.  Overall, patient reports not feeling well.  Assessment/Plan: Principal Problem:   Acute hypoxemic respiratory failure (HCC) Active Problems:   Chronic pain   Chest pain   Acute respiratory failure with hypoxia (HCC)   Anemia   Acute on chronic diastolic CHF (congestive heart failure) (HCC)   RSV (respiratory syncytial virus pneumonia)   DM type 2 (diabetes mellitus, type 2) (HCC)   CKD (chronic kidney disease), stage IV (HCC)  Improving acute hypoxic respiratory failure, multifactorial, possibly secondary to RSV pneumonia and acute on chronic diastolic CHF -Presented with hypoxia, tachycardia, tachypnea placed on BiPAP -Repeat chest x-ray done on 04/11/2021 to reveal likely multilobar pneumonia and pulmonary edema -Respiratory panel positive for RSV -Continue IV antibiotics Zosyn day #4/5 Blood cultures x2 negative to date Continue to wean off BiPAP and oxygen supplementation as tolerated Home O2 evaluation ordered on 04/14/18 still pending Maintain O2 saturation greater than 92% 04/16/2018: Repeat chest x-ray, BNP and renal panel in a.m.  Increase IV Lasix from 20 mg twice daily to 60 mg twice daily.  Monitor renal function and electrolytes closely.   Worsening AKI  on CKD 4 Baseline creatinine 1.86 with GFR of 28 Presented with creatinine of 2.12 Creatinine is improving with diuresis. Possible component of cardiorenal syndrome. Serum creatinine is 1.92 today. Increase IV Lasix from 20 mg twice daily to 60 mg twice daily. Continue to monitor renal function and electrolytes closely. Labs in the morning.  Acute on chronic diastolic CHF Presented with elevated BNP Increase in pulmonary vascularity on chest x-ray done in admission Continue Lasix, but increase the dose as documented above.   Continue beta-blockers. Normal ejection fraction as per last echocardiogram. Troponin flat peaked at 0.06 Continue strict I's and O's and daily weight  Resolved uncontrolled hypertension Blood pressure is currently normotensive Continue home medications p.o. hydralazine and p.o. Lasix  Hyperkalemia:  Resolved.    Type 2 diabetes complicated by CKD Hemoglobin A1c from 03/15/2018 was 6.4 from 7.7 on 12/31/2017 Avoid hypoglycemia Continue sensitive insulin sliding scale 04/16/2018: Discontinue subcutaneous Lantus.  DVT prophylaxis:  Subcu Lovenox daily Code Status: DNR Family Communication:  None at bedside Consults called: none    Disposition: Skilled nursing facility when patient is optimized.   Objective: Vitals:   04/15/18 2313 04/16/18 0353 04/16/18 0546 04/16/18 0737  BP: 130/81 128/89 127/81 (!) 146/102  Pulse: (!) 102 99  96  Resp: 17 17  (!) 23  Temp: 98.5 F (36.9 C) 98.4 F (36.9 C)  98.1 F (36.7 C)  TempSrc: Oral Oral  Oral  SpO2: 100% 99%  99%  Weight:  70.1 kg    Height:        Intake/Output Summary (Last 24 hours) at 04/16/2018 0928 Last data filed at 04/15/2018 2313 Gross per 24 hour  Intake 240 ml  Output 400 ml  Net -160 ml  Filed Weights   04/13/18 0439 04/15/18 0416 04/16/18 0353  Weight: 74.3 kg 77.7 kg 70.1 kg    Exam:  . General: 68 y.o. year-old female well-developed well-nourished no acute distress.  Alert  and oriented x3.   . Cardiovascular: S1-S2 . Respiratory: Decreased air entry globally . Abdomen: Soft nontender nondistended with normal bowel sounds x4 quadrants. . Musculoskeletal: Mild leg edema. Neuro: Patient is awake and alert.  Patient moves all extremities.    Data Reviewed: CBC: Recent Labs  Lab 04/11/18 0341 04/11/18 0350 04/11/18 1840 04/12/18 0609  WBC 4.9  --  3.8* 4.1  NEUTROABS 4.0  --   --   --   HGB 7.0* 6.8* 8.7* 9.6*  HCT 22.7* 20.0* 26.7* 29.0*  MCV 96.2  --  90.8 92.4  PLT 220  --  204 287   Basic Metabolic Panel: Recent Labs  Lab 04/12/18 0306 04/13/18 0339 04/14/18 0230 04/15/18 0320 04/16/18 0347  NA 140 141 137 134* 139  K 5.3* 4.5 4.1 3.7 3.4*  CL 107 107 101 101 103  CO2 23 26 28 28 28   GLUCOSE 88 88 109* 103* 69*  BUN 36* 43* 47* 49* 46*  CREATININE 1.86* 2.12* 1.93* 2.02* 1.95*  CALCIUM 8.2* 8.0* 7.7* 7.6* 8.0*   GFR: Estimated Creatinine Clearance: 26.6 mL/min (A) (by C-G formula based on SCr of 1.95 mg/dL (H)). Liver Function Tests: Recent Labs  Lab 04/11/18 0341  AST 39  ALT 15  ALKPHOS 47  BILITOT 1.0  PROT 5.9*  ALBUMIN 2.3*   No results for input(s): LIPASE, AMYLASE in the last 168 hours. No results for input(s): AMMONIA in the last 168 hours. Coagulation Profile: No results for input(s): INR, PROTIME in the last 168 hours. Cardiac Enzymes: Recent Labs  Lab 04/11/18 0341 04/11/18 1253 04/11/18 1840  TROPONINI <0.03 0.05* 0.06*   BNP (last 3 results) No results for input(s): PROBNP in the last 8760 hours. HbA1C: No results for input(s): HGBA1C in the last 72 hours. CBG: Recent Labs  Lab 04/15/18 1157 04/15/18 1548 04/15/18 2029 04/16/18 0501 04/16/18 0545  GLUCAP 172* 110* 133* 62* 79   Lipid Profile: No results for input(s): CHOL, HDL, LDLCALC, TRIG, CHOLHDL, LDLDIRECT in the last 72 hours. Thyroid Function Tests: No results for input(s): TSH, T4TOTAL, FREET4, T3FREE, THYROIDAB in the last 72 hours.  Anemia Panel: No results for input(s): VITAMINB12, FOLATE, FERRITIN, TIBC, IRON, RETICCTPCT in the last 72 hours. Urine analysis:    Component Value Date/Time   COLORURINE YELLOW 02/25/2018 2316   APPEARANCEUR HAZY (A) 02/25/2018 2316   LABSPEC 1.014 02/25/2018 2316   PHURINE 5.0 02/25/2018 2316   GLUCOSEU NEGATIVE 02/25/2018 2316   HGBUR SMALL (A) 02/25/2018 2316   BILIRUBINUR NEGATIVE 02/25/2018 2316   KETONESUR NEGATIVE 02/25/2018 2316   PROTEINUR >=300 (A) 02/25/2018 2316   UROBILINOGEN 1.0 12/02/2013 1612   NITRITE NEGATIVE 02/25/2018 2316   LEUKOCYTESUR SMALL (A) 02/25/2018 2316   Sepsis Labs: @LABRCNTIP (procalcitonin:4,lacticidven:4)  ) Recent Results (from the past 240 hour(s))  Respiratory Panel by PCR     Status: Abnormal   Collection Time: 04/11/18  4:16 AM  Result Value Ref Range Status   Adenovirus NOT DETECTED NOT DETECTED Final   Coronavirus 229E NOT DETECTED NOT DETECTED Final    Comment: (NOTE) The Coronavirus on the Respiratory Panel, DOES NOT test for the novel  Coronavirus (2019 nCoV)    Coronavirus HKU1 NOT DETECTED NOT DETECTED Final   Coronavirus NL63 NOT DETECTED NOT DETECTED  Final   Coronavirus OC43 NOT DETECTED NOT DETECTED Final   Metapneumovirus NOT DETECTED NOT DETECTED Final   Rhinovirus / Enterovirus NOT DETECTED NOT DETECTED Final   Influenza A NOT DETECTED NOT DETECTED Final   Influenza B NOT DETECTED NOT DETECTED Final   Parainfluenza Virus 1 NOT DETECTED NOT DETECTED Final   Parainfluenza Virus 2 NOT DETECTED NOT DETECTED Final   Parainfluenza Virus 3 NOT DETECTED NOT DETECTED Final   Parainfluenza Virus 4 NOT DETECTED NOT DETECTED Final   Respiratory Syncytial Virus DETECTED (A) NOT DETECTED Final    Comment: CRITICAL RESULT CALLED TO, READ BACK BY AND VERIFIED WITH: RN K COBB [id number] MLM    Bordetella pertussis NOT DETECTED NOT DETECTED Final   Chlamydophila pneumoniae NOT DETECTED NOT DETECTED Final   Mycoplasma pneumoniae  NOT DETECTED NOT DETECTED Final    Comment: Performed at [facility], [place] [address] [place], [place] [address]  Blood culture (routine x 2)     Status: None (Preliminary result)   Collection Time: 04/11/18  4:18 AM  Result Value Ref Range Status   Specimen Description BLOOD RIGHT HAND  Final   Special Requests   Final    BOTTLES DRAWN AEROBIC ONLY Blood Culture adequate volume   Culture   Final    NO GROWTH 4 DAYS Performed at [facility], [place] [address], [place], [place] [address]    Report Status PENDING  Incomplete  Blood culture (routine x 2)     Status: None (Preliminary result)   Collection Time: 04/11/18  4:20 AM  Result Value Ref Range Status   Specimen Description BLOOD RIGHT ARM  Final   Special Requests   Final    BOTTLES DRAWN AEROBIC AND ANAEROBIC Blood Culture adequate volume   Culture   Final    NO GROWTH 4 DAYS Performed at [facility], [place] [address] [place], [place] [address]    Report Status PENDING  Incomplete  Expectorated sputum assessment w rflx to resp cult     Status: None   Collection Time: 04/14/18  6:30 AM  Result Value Ref Range Status   Specimen Description SPUTUM  Final   Special Requests Normal  Final   Sputum evaluation   Final    THIS SPECIMEN IS ACCEPTABLE FOR SPUTUM CULTURE Performed at [facility], [place] [address] [place], [place] [address]    Report Status 04/14/2018 FINAL  Final  Culture, respiratory     Status: None (Preliminary result)   Collection Time: 04/14/18  6:30 AM  Result Value Ref Range Status   Specimen Description SPUTUM  Final   Special Requests Normal Reflexed from [id number]  Final   Gram Stain   Final    MODERATE WBC PRESENT,BOTH PMN AND MONONUCLEAR ABUNDANT YEAST WITH PSEUDOHYPHAE    Culture   Final    FEW YEAST CULTURE REINCUBATED FOR BETTER GROWTH Performed at [facility], [place] [address], [place], [place] [address]    Report Status PENDING  Incomplete      Studies: No  results found.  Scheduled Meds: . atorvastatin  80 mg Oral QHS  . busPIRone  15 mg Oral BID  . carbamide peroxide  5 drop Right EAR BID  . divalproex  1,000 mg Oral Daily  . enoxaparin (LOVENOX) injection  30 mg Subcutaneous Q24H  . furosemide  20 mg Intravenous BID  . hydrALAZINE  100 mg Oral Q8H  . insulin aspart  0-5 Units Subcutaneous QHS  . insulin aspart  0-9 Units Subcutaneous TID WC  . isosorbide mononitrate  30 mg Oral Daily  . levothyroxine  200 mcg Oral Daily  . pantoprazole  40 mg Oral Daily  . sodium chloride flush  3 mL Intravenous Q12H  . sodium chloride flush  3 mL Intravenous Q12H    Continuous Infusions: . sodium chloride    . piperacillin-tazobactam (ZOSYN)  IV 3.375 g (04/16/18 0803)     LOS: 5 days     Bonnell Public, MD [facility] Pager [phone].  If 7PM-7AM, please contact night-coverage [email or web address] Password [facility] 04/16/2018, 9:28 AM

## 2018-04-16 NOTE — Care Management Important Message (Signed)
Important Message  Patient Details  Name: Andrea [name] MRN: [id number] Date of Birth: 1950-06-04   Medicare Important Message Given:  Yes    Wiley Magan [address] 04/16/2018, 2:33 PM

## 2018-04-16 NOTE — Progress Notes (Addendum)
RD working remotely  Initial Nutrition Assessment  DOCUMENTATION CODES:   Not applicable  INTERVENTION:    D/C calorie count  Liberalize diet to Regular  Ensure Enlive po BID, each supplement provides 350 kcal and 20 grams of protein  NUTRITION DIAGNOSIS:   Inadequate oral intake related to decreased appetite as evidenced by per patient/family report, meal completion < 50%  GOAL:   Patient will meet greater than or equal to 90% of their needs  MONITOR:   PO intake, Supplement acceptance, Labs, Skin, Weight trends, I & O's  REASON FOR ASSESSMENT:   Calorie Count, hypoglycemia   ASSESSMENT:   68 yo with PMH diastolic CHF, 3VCAD, ischemic cardiomyopathy presented via EMS with resp distress, hypoxia tachypnea, rales, "fluid on her". Treated with BiPAP and then weaned to 3L [place].  Pt admitted from [facility].  Admit dx include: Acute on chronic systolic congestive heart failure (HCC) [I50.23] Symptomatic anemia [D64.9] Acute hypoxemic respiratory failure ([place]) [J96.01]  RD spoke with pt via phone. She is slightly confused, however, does report a decreased appetite. States she consumed ~25% of breakfast; doesn't really like the hospital food.  Of note pt had two episodes of hypoglycemia early this AM. Verbal with Read Back order reviewed per Dr. Marthenia Rolling to liberalize diet. This will allow wider variety of menu options, hopefully improving pt's PO intake.  Pt was amenable to nutrition supplements as well; will order. Labs & medications reviewed. K 3.4 (L). CBG's [id number].  Pt's weight has been stable per encounters below.  NUTRITION - FOCUSED PHYSICAL EXAM:  Unable to complete at this time  Diet Order:   Diet Order            Diet regular Room service appropriate? Yes; Fluid consistency: Thin  Diet effective now             EDUCATION NEEDS:   Not appropriate for education at this time  Skin:  Skin Assessment: Reviewed RN  Assessment  Last BM:  3/25  Height:   Ht Readings from Last 1 Encounters:  04/11/18 5\' 6"  (1.676 m)   Weight:   Wt Readings from Last 1 Encounters:  04/16/18 70.1 kg   Wt Readings from Last 10 Encounters:  04/16/18 70.1 kg  03/27/18 70.3 kg  03/07/18 72.8 kg  01/28/18 68.4 kg  01/22/18 69 kg  01/08/18 71 kg  01/04/18 73 kg  12/31/17 79 kg  03/18/17 67.1 kg  02/18/17 60 kg   BMI:  Body mass index is 24.94 kg/m.  Estimated Nutritional Needs:   Kcal:  1700-1900  Protein:  85-100 gm  Fluid:  1.7-1.9 L  Arthur Holms, RD, LDN Pager #: [phone] After-Hours Pager #: [phone]

## 2018-04-17 ENCOUNTER — Inpatient Hospital Stay (HOSPITAL_COMMUNITY): Payer: Medicare Other

## 2018-04-17 LAB — CBC WITH DIFFERENTIAL/PLATELET
Abs Immature Granulocytes: 0.02 10*3/uL (ref 0.00–0.07)
Basophils Absolute: 0 10*3/uL (ref 0.0–0.1)
Basophils Relative: 0 %
Eosinophils Absolute: 0.1 10*3/uL (ref 0.0–0.5)
Eosinophils Relative: 3 %
HCT: 23.8 % — ABNORMAL LOW (ref 36.0–46.0)
Hemoglobin: 8 g/dL — ABNORMAL LOW (ref 12.0–15.0)
Immature Granulocytes: 0 %
Lymphocytes Relative: 42 %
Lymphs Abs: 2.1 10*3/uL (ref 0.7–4.0)
MCH: 30.1 pg (ref 26.0–34.0)
MCHC: 33.6 g/dL (ref 30.0–36.0)
MCV: 89.5 fL (ref 80.0–100.0)
Monocytes Absolute: 0.5 10*3/uL (ref 0.1–1.0)
Monocytes Relative: 9 %
Neutro Abs: 2.3 10*3/uL (ref 1.7–7.7)
Neutrophils Relative %: 46 %
Platelets: 210 10*3/uL (ref 150–400)
RBC: 2.66 MIL/uL — ABNORMAL LOW (ref 3.87–5.11)
RDW: 13.9 % (ref 11.5–15.5)
WBC: 5 10*3/uL (ref 4.0–10.5)
nRBC: 0 % (ref 0.0–0.2)

## 2018-04-17 LAB — RENAL FUNCTION PANEL
Albumin: 1.8 g/dL — ABNORMAL LOW (ref 3.5–5.0)
Anion gap: 11 (ref 5–15)
BUN: 45 mg/dL — ABNORMAL HIGH (ref 8–23)
CO2: 28 mmol/L (ref 22–32)
Calcium: 8 mg/dL — ABNORMAL LOW (ref 8.9–10.3)
Chloride: 98 mmol/L (ref 98–111)
Creatinine, Ser: 1.88 mg/dL — ABNORMAL HIGH (ref 0.44–1.00)
GFR calc Af Amer: 31 mL/min — ABNORMAL LOW (ref >60)
GFR calc non Af Amer: 27 mL/min — ABNORMAL LOW (ref >60)
Glucose, Bld: 132 mg/dL — ABNORMAL HIGH (ref 70–99)
Phosphorus: 2.8 mg/dL (ref 2.5–4.6)
Potassium: 3.5 mmol/L (ref 3.5–5.1)
Sodium: 137 mmol/L (ref 135–145)

## 2018-04-17 LAB — BRAIN NATRIURETIC PEPTIDE: B Natriuretic Peptide: 3208.7 pg/mL — ABNORMAL HIGH (ref 0.0–100.0)

## 2018-04-17 LAB — GLUCOSE, CAPILLARY
Glucose-Capillary: 112 mg/dL — ABNORMAL HIGH (ref 70–99)
Glucose-Capillary: 128 mg/dL — ABNORMAL HIGH (ref 70–99)
Glucose-Capillary: 133 mg/dL — ABNORMAL HIGH (ref 70–99)
Glucose-Capillary: 117 mg/dL — ABNORMAL HIGH (ref 70–99)

## 2018-04-17 LAB — MAGNESIUM: Magnesium: 1.8 mg/dL (ref 1.7–2.4)

## 2018-04-17 MED ORDER — ISOSORBIDE MONONITRATE ER 30 MG PO TB24
30.0000 mg | ORAL_TABLET | Freq: Once | ORAL | Status: AC
  Administered 2018-04-17: 30 mg via ORAL
  Filled 2018-04-17: qty 1

## 2018-04-17 MED ORDER — METOPROLOL TARTRATE 5 MG/5ML IV SOLN
10.0000 mg | INTRAVENOUS | Status: DC | PRN
  Filled 2018-04-17: qty 10

## 2018-04-17 MED ORDER — HYDRALAZINE HCL 20 MG/ML IJ SOLN: 5.0000 mg | INTRAMUSCULAR | Status: DC | PRN

## 2018-04-17 MED ORDER — ACETAMINOPHEN 325 MG PO TABS
650.0000 mg | ORAL_TABLET | Freq: Four times a day (QID) | ORAL | Status: DC | PRN
  Administered 2018-04-19 – 2018-04-25 (×2): 650 mg via ORAL
  Filled 2018-04-17 (×2): qty 2

## 2018-04-17 MED ORDER — ISOSORBIDE MONONITRATE ER 60 MG PO TB24
60.0000 mg | ORAL_TABLET | Freq: Every day | ORAL | Status: DC
  Administered 2018-04-18: 60 mg via ORAL
  Filled 2018-04-17: qty 1

## 2018-04-17 MED ORDER — GUAIFENESIN ER 600 MG PO TB12
600.0000 mg | ORAL_TABLET | Freq: Two times a day (BID) | ORAL | Status: DC
  Administered 2018-04-17 – 2018-04-20 (×7): 600 mg via ORAL
  Filled 2018-04-17 (×7): qty 1

## 2018-04-17 MED ORDER — CARVEDILOL 6.25 MG PO TABS
6.2500 mg | ORAL_TABLET | Freq: Two times a day (BID) | ORAL | Status: DC
  Administered 2018-04-17 – 2018-04-18 (×2): 6.25 mg via ORAL
  Filled 2018-04-17 (×2): qty 1

## 2018-04-17 NOTE — Progress Notes (Signed)
Physical Therapy Treatment Patient Details Name: Andrea [name] MRN: [id number] DOB: Dec 12, 1950 Today's Date: 04/17/2018    History of Present Illness 5yow from [facility] PMHdiastolic CHF, 3VCAD, ischemiccardiomyopathypresented via EMS with resp distress, hypoxia tachypnea, rales, "fluid on her". Treated with BiPAP and now weaned to [facility].    PT Comments    Patient progressing this visit, now ambulating 35' in room with fatigue at end, VSS on 3L. Cont to rec SNF as patient is too weak at this time to be unsupervised for OOB mobility.   Follow Up Recommendations  SNF;Supervision/Assistance - 24 hour     Equipment Recommendations  Other (comment)(TBD)    Recommendations for Other Services       Precautions / Restrictions Precautions Precautions: Fall Precaution Comments: Watch SpO2 and BP  Restrictions Weight Bearing Restrictions: No    Mobility  Bed Mobility Overal bed mobility: Needs Assistance Bed Mobility: Sit to Supine       Sit to supine: Supervision   General bed mobility comments: Supervision for line management   Transfers Overall transfer level: Needs assistance Equipment used: Rolling walker (2 wheeled) Transfers: Sit to/from Stand Sit to Stand: Min guard         General transfer comment: Min guard for steadying assist to stand. Demonstrated safe hand placement.   Ambulation/Gait Ambulation/Gait assistance: Min guard Gait Distance (Feet): 30 Feet(15' 15') Assistive device: Rolling walker (2 wheeled) Gait Pattern/deviations: Step-through pattern;Decreased stride length Gait velocity: Decreased    General Gait Details: contact guard to walk short distances on 3L, VSS, pt reports fatigue by end and return to bed.    Stairs             Wheelchair Mobility    Modified Rankin (Stroke Patients Only)       Balance Overall balance assessment: Needs assistance Sitting-balance support: No upper extremity supported;Feet  supported Sitting balance-Leahy Scale: Good     Standing balance support: Bilateral upper extremity supported Standing balance-Leahy Scale: Poor Standing balance comment: Reliant on BUE support                             Cognition Arousal/Alertness: Awake/alert Behavior During Therapy: Flat affect Overall Cognitive Status: No family/caregiver present to determine baseline cognitive functioning                                 General Comments: Slow processing noted      Exercises General Exercises - Lower Extremity Ankle Circles/Pumps: AROM;Both;10 reps Long Arc Quad: AROM;Both;10 reps;Seated    General Comments        Pertinent Vitals/Pain Faces Pain Scale: Hurts little more Pain Location: back Pain Descriptors / Indicators: Aching    Home Living                      Prior Function            PT Goals (current goals can now be found in the care plan section) Acute Rehab PT Goals Patient Stated Goal: to feel better PT Goal Formulation: With patient Time For Goal Achievement: 04/26/18 Potential to Achieve Goals: Fair    Frequency    Min 2X/week      PT Plan Current plan remains appropriate    Co-evaluation              AM-PAC PT "6 Clicks" Mobility   Outcome  Measure  Help needed turning from your back to your side while in a flat bed without using bedrails?: A Little Help needed moving from lying on your back to sitting on the side of a flat bed without using bedrails?: A Little Help needed moving to and from a bed to a chair (including a wheelchair)?: A Lot Help needed standing up from a chair using your arms (e.g., wheelchair or bedside chair)?: A Little Help needed to walk in hospital room?: A Little Help needed climbing 3-5 steps with a railing? : Total 6 Click Score: 15    End of Session Equipment Utilized During Treatment: Gait belt;Oxygen Activity Tolerance: Patient limited by fatigue;Treatment limited  secondary to medical complications (Comment)(nausea/vomiting) Patient left: in bed;with call bell/phone within reach Nurse Communication: Mobility status PT Visit Diagnosis: Muscle weakness (generalized) (M62.81);Unsteadiness on feet (R26.81)     Time: 1130-1145 PT Time Calculation (min) (ACUTE ONLY): 15 min  Charges:  $Gait Training: 8-22 mins                     Reinaldo Berber, PT, DPT Acute Rehabilitation Services Pager: [phone] Office: [phone]     Reinaldo Berber 04/17/2018, 11:45 AM

## 2018-04-17 NOTE — Progress Notes (Signed)
CSW called to [facility], they are not accepting any new patients at this time. CSW followed up with [facility] about a possible opening, CSW is awaiting a response.   CSW will continue to follow and assist with disposition.   Domenic Schwab, MSW, [place]

## 2018-04-17 NOTE — Progress Notes (Signed)
Physical Therapy Treatment Patient Details Name: Andrea [name] MRN: [id number] DOB: [birth year] Today's Date: 04/17/2018    History of Present Illness 51yow from [facility] PMHdiastolic CHF, 3VCAD, ischemiccardiomyopathypresented via EMS with resp distress, hypoxia tachypnea, rales, "fluid on her". Treated with BiPAP and now weaned to [facility]    PT Comments    Patient sleeping upon entry, lethargic during visit. Stood EOB side stepped but not alert enough to ambulate despite attempting to wake up throughout visit. Cont to rec SNF.    Follow Up Recommendations  SNF;Supervision/Assistance - 24 hour     Equipment Recommendations  Other (comment)    Recommendations for Other Services       Precautions / Restrictions Precautions Precautions: Fall Precaution Comments: Watch SpO2 and BP  Restrictions Weight Bearing Restrictions: No    Mobility  Bed Mobility Overal bed mobility: Needs Assistance Bed Mobility: Sit to Supine       Sit to supine: Supervision   General bed mobility comments: Supervision for line management   Transfers Overall transfer level: Needs assistance Equipment used: Rolling walker (2 wheeled) Transfers: Sit to/from Stand Sit to Stand: Min guard         General transfer comment: Patient fatigued, tolerating side stepping with preamture sit. unable to safely walk this weekend.  Ambulation/Gait                 Stairs             Wheelchair Mobility    Modified Rankin (Stroke Patients Only)       Balance Overall balance assessment: Needs assistance Sitting-balance support: No upper extremity supported;Feet supported Sitting balance-Leahy Scale: Good     Standing balance support: Bilateral upper extremity supported Standing balance-Leahy Scale: Poor Standing balance comment: Reliant on BUE support                High Level Balance Comments: tolerating fair with RW            Cognition  Arousal/Alertness: Awake/alert Behavior During Therapy: Flat affect Overall Cognitive Status: No family/caregiver present to determine baseline cognitive functioning Area of Impairment: Safety/judgement;Orientation;Awareness                         Safety/Judgement: Decreased awareness of safety;Decreased awareness of deficits   Problem Solving: Decreased initiation;Slow processing;Requires verbal cues General Comments: Slow processing noted      Exercises      General Comments        Pertinent Vitals/Pain Pain Assessment: Faces Faces Pain Scale: Hurts little more Pain Location: back Pain Descriptors / Indicators: Aching Pain Intervention(s): Limited activity within patient's tolerance    Home Living Family/patient expects to be discharged to:: Skilled nursing facility                    Prior Function Level of Independence: Independent with assistive device(s)      Comments: Pt became increasingly angry with any additional questions.   PT Goals (current goals can now be found in the care plan section) Acute Rehab PT Goals Patient Stated Goal: to feel better PT Goal Formulation: With patient Time For Goal Achievement: 04/26/18 Potential to Achieve Goals: Fair Progress towards PT goals: Progressing toward goals    Frequency    Min 2X/week      PT Plan Current plan remains appropriate    Co-evaluation              AM-PAC PT "6 Clicks"  Mobility   Outcome Measure  Help needed turning from your back to your side while in a flat bed without using bedrails?: A Little Help needed moving from lying on your back to sitting on the side of a flat bed without using bedrails?: A Little Help needed moving to and from a bed to a chair (including a wheelchair)?: A Lot Help needed standing up from a chair using your arms (e.g., wheelchair or bedside chair)?: A Little Help needed to walk in hospital room?: A Little Help needed climbing 3-5 steps with  a railing? : Total 6 Click Score: 15    End of Session Equipment Utilized During Treatment: Gait belt;Oxygen Activity Tolerance: Patient limited by fatigue;Treatment limited secondary to medical complications (Comment) Patient left: in bed;with call bell/phone within reach Nurse Communication: Mobility status PT Visit Diagnosis: Muscle weakness (generalized) (M62.81);Unsteadiness on feet (R26.81)     Time: 5379-4327 PT Time Calculation (min) (ACUTE ONLY): 15 min  Charges:  $Therapeutic Activity: 8-22 mins                     Reinaldo Berber, PT, DPT Acute Rehabilitation Services Pager: [phone] Office: [phone]     Reinaldo Berber 04/17/2018, 4:37 PM

## 2018-04-17 NOTE — Progress Notes (Signed)
[place] TEAM 1 - Stepdown/ICU TEAM  Andrea [name]  ZHG:[id number] DOB: [birth year] DOA: 04/11/2018 PCP: Medicine, [facility]    Brief Narrative:  [phone] from [facility] w/ a hx of diastolic CHF, 3V    CAD, and ischemiccardiomyopathy who presented with resp distress with sudden worsening of SOB, chest pressure, and leg edema. She was found to be RSV positive, and has required noninvasive mechanical ventilation (BiPAP) and diuresis. She was also hospitalized from 2/23 - 3/6 for CHF, AKI, and confusion from narcotics.  Significant Events: 3/21 admit   Subjective: Reports that she feels sob, though not quite as bad as at admission. Denies cp, n/v, or abdom pian.   Assessment & Plan:  Acute hypoxic respiratory failure - RSV pneumonia + pulmonary edema  Still requiring 3L [place] support - wean as able   Acute on chronic diastolic CHF - hx of Systolic CHF Dec 2297 (EF 30-35% TTE) EF 03/05/18 noted EF 55-60% w/ mild LVH, normal RV fxn, and no valvular issues - cont to diurese - wgt is trending down  [employer]   04/13/18 0439 04/15/18 0416 04/16/18 0353  Weight: 74.3 kg 77.7 kg 70.1 kg    Acute kidney injury on CKD Stage 4 Baseline creatinine 1.86 with GFR of 28 - renal fxn holding steady for now - cont to follow   Recent Labs  Lab 04/13/18 0339 04/14/18 0230 04/15/18 0320 04/16/18 0347 04/17/18 0202  CREATININE 2.12* 1.93* 2.02* 1.95* 1.88*    Uncontrolled HTN BP poorly controlled - adjust tx and follow  Normocytic anemia  S/p 1U PRBC thus far - follow trend -   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 rate 18, height 5\' 6"  (1.676 m), weight 70.1 kg, SpO2 98 %.  Intake/Output Summary (Last 24 hours) at 04/17/2018 1005 Last data filed at 04/17/2018 0300 Gross per 24 hour  Intake -  Output 300 ml  Net -300 ml   Filed Weights   04/13/18 0439 04/15/18 0416 04/16/18 0353  Weight: 74.3 kg 77.7 kg 70.1 kg    Examination: General: No acute respiratory distress Lungs: fine crackles diffusely - no wheezing Cardiovascular: Regular rate without murmur gallop or rub normal S1 and S2 Abdomen: Nontender, nondistended, soft, bowel sounds positive, no rebound, no ascites, no appreciable mass Extremities: trace B LE edema   CBC: Recent Labs  Lab 04/11/18 0341  04/11/18 1840 04/12/18 0609 04/17/18 0202  WBC 4.9  --  3.8* 4.1 5.0  NEUTROABS 4.0  --   --   --  2.3  HGB 7.0*   < > 8.7* 9.6* 8.0*  HCT 22.7*   < > 26.7* 29.0* 23.8*  MCV 96.2  --  90.8 92.4 89.5  PLT 220  --  204 223 210   < > = values in this interval not displayed.   Basic Metabolic Panel: Recent Labs  Lab 04/15/18 0320 04/16/18 0347 04/17/18 0202  NA 134* 139 137  K 3.7 3.4* 3.5  CL 101 103 98  CO2 28 28 28   GLUCOSE 103* 69* 132*  BUN 49* 46* 45*  CREATININE 2.02* 1.95* 1.88*  CALCIUM  7.6* 8.0* 8.0*  MG  --   --  1.8  PHOS  --   --  2.8   GFR: Estimated Creatinine Clearance: 27.2 mL/min (A) (by C-G formula based on SCr of 1.88 mg/dL (H)).  Liver Function Tests: Recent Labs  Lab 04/11/18 0341 04/17/18 0202  AST 39  --   ALT 15  --   ALKPHOS 47  --   BILITOT 1.0  --   PROT 5.9*  --   ALBUMIN 2.3* 1.8*    Cardiac Enzymes: Recent Labs  Lab 04/11/18 0341 04/11/18 1253 04/11/18 1840  TROPONINI <0.03 0.05* 0.06*    HbA1C: Hgb A1c MFr Bld  Date/Time Value Ref Range Status  03/15/2018 07:28 AM 6.4 (H) 4.8 - 5.6 % Final    Comment:    (NOTE) Pre diabetes:          5.7%-6.4% Diabetes:              >6.4% Glycemic control for   <7.0% adults with diabetes   12/31/2017 04:17 AM 7.7 (H) 4.8 -  5.6 % Final    Comment:    (NOTE) Pre diabetes:          5.7%-6.4% Diabetes:              >6.4% Glycemic control for   <7.0% adults with diabetes     CBG: Recent Labs  Lab 04/16/18 0545 04/16/18 1144 04/16/18 1632 04/16/18 2133 04/17/18 0755  GLUCAP 79 80 125* 141* 112*    Recent Results (from the past 240 hour(s))  Respiratory Panel by PCR     Status: Abnormal   Collection Time: 04/11/18  4:16 AM  Result Value Ref Range Status   Adenovirus NOT DETECTED NOT DETECTED Final   Coronavirus 229E NOT DETECTED NOT DETECTED Final    Comment: (NOTE) The Coronavirus on the Respiratory Panel, DOES NOT test for the novel  Coronavirus (2019 nCoV)    Coronavirus HKU1 NOT DETECTED NOT DETECTED Final   Coronavirus NL63 NOT DETECTED NOT DETECTED Final   Coronavirus OC43 NOT DETECTED NOT DETECTED Final   Metapneumovirus NOT DETECTED NOT DETECTED Final   Rhinovirus / Enterovirus NOT DETECTED NOT DETECTED Final   Influenza A NOT DETECTED NOT DETECTED Final   Influenza B NOT DETECTED NOT DETECTED Final   Parainfluenza Virus 1 NOT DETECTED NOT DETECTED Final   Parainfluenza Virus 2 NOT DETECTED NOT DETECTED Final   Parainfluenza Virus 3 NOT DETECTED NOT DETECTED Final   Parainfluenza Virus 4 NOT DETECTED NOT DETECTED Final   Respiratory Syncytial Virus DETECTED (A) NOT DETECTED Final    Comment: CRITICAL RESULT CALLED TO, READ BACK BY AND VERIFIED WITH: RN K COBB [id number] MLM    Bordetella pertussis NOT DETECTED NOT DETECTED Final   Chlamydophila pneumoniae NOT DETECTED NOT DETECTED Final   Mycoplasma pneumoniae NOT DETECTED NOT DETECTED Final    Comment: Performed at [facility], [address], [place], [place] [address]  Blood culture (routine x 2)     Status: None   Collection Time: 04/11/18  4:18 AM  Result Value Ref Range Status   Specimen Description BLOOD RIGHT HAND  Final   Special Requests   Final    BOTTLES DRAWN AEROBIC ONLY Blood Culture adequate volume    Culture   Final    NO GROWTH 5 DAYS Performed at [facility], [place] [address], [place], [place] [address]    Report Status 04/16/2018 FINAL  Final  Blood culture (routine x 2)     Status: None   Collection Time: 04/11/18  4:20 AM  Result Value Ref Range Status   Specimen Description BLOOD RIGHT ARM  Final   Special Requests   Final    BOTTLES DRAWN AEROBIC AND ANAEROBIC Blood Culture adequate volume   Culture   Final    NO GROWTH 5 DAYS Performed at [facility], [address], [place], [place] [address]    Report Status 04/16/2018 FINAL  Final  Expectorated sputum assessment w rflx to resp cult     Status: None   Collection Time: 04/14/18  6:30 AM  Result Value Ref Range Status   Specimen Description SPUTUM  Final   Special Requests Normal  Final   Sputum evaluation   Final    THIS SPECIMEN IS ACCEPTABLE FOR SPUTUM CULTURE Performed at [facility], [place] [address] [place], [place] [address]    Report Status 04/14/2018 FINAL  Final  Culture, respiratory     Status: None (Preliminary result)   Collection Time: 04/14/18  6:30 AM  Result Value Ref Range Status   Specimen Description SPUTUM  Final   Special Requests Normal Reflexed from [id number]  Final   Gram Stain   Final    MODERATE WBC PRESENT,BOTH PMN AND MONONUCLEAR ABUNDANT YEAST WITH PSEUDOHYPHAE Performed at [facility], [place] [address] [place],  [address]    Culture FEW YEAST FEW STAPHYLOCOCCUS AUREUS   Final   Report Status PENDING  Incomplete     Scheduled Meds: . atorvastatin  80 mg Oral QHS  . busPIRone  15 mg Oral BID  . carbamide peroxide  5 drop Right EAR BID  . ciprofloxacin-dexamethasone  4 drop Right EAR BID  . divalproex  1,000 mg Oral Daily  . enoxaparin (LOVENOX) injection  30 mg Subcutaneous Q24H  . feeding supplement (ENSURE ENLIVE)  237 mL Oral BID BM  . furosemide  60 mg Intravenous BID  . hydrALAZINE  100 mg Oral Q8H  . insulin aspart  0-5 Units Subcutaneous  QHS  . insulin aspart  0-9 Units Subcutaneous TID WC  . isosorbide mononitrate  30 mg Oral Daily  . levothyroxine  200 mcg Oral Daily  . pantoprazole  40 mg Oral Daily  . sodium chloride flush  3 mL Intravenous Q12H  . sodium chloride flush  3 mL Intravenous Q12H     LOS: 6 days   Cherene Altes, MD [facility] Office  [phone] Pager - Text Page per Shea Evans  If 7PM-7AM, please contact night-coverage per Amion 04/17/2018, 10:05 AM

## 2018-04-17 NOTE — Progress Notes (Signed)
Occupational Therapy Treatment Patient Details Name: Andrea [name] MRN: [id number] DOB: 06/19/1950 Today's Date: 04/17/2018    History of present illness 14yow [facility], 3VCAD, ischemiccardiomyopathypresented via EMS with resp distress, hypoxia tachypnea, rales, "fluid on her". Treated with BiPAP and now weaned to [facility].   OT comments  Pt performing bed mobility with supervisionA; transfers with minguardA and ADL functional mobility for standing  tasks at sink x3 mins. Pt performing ADL functional mobility with RW and pt performing grooming at sink with fair balance once BUEs are supported. Pt would benefit from continued OT skilled services for ADL, mobility and safety in SNF setting. OT to follow acutely.     Follow Up Recommendations  SNF;Supervision/Assistance - 24 hour    Equipment Recommendations       Recommendations for Other Services      Precautions / Restrictions Precautions Precautions: Fall Precaution Comments: Watch SpO2 and BP  Restrictions Weight Bearing Restrictions: No       Mobility Bed Mobility Overal bed mobility: Needs Assistance Bed Mobility: Sit to Supine       Sit to supine: Supervision   General bed mobility comments: Supervision for line management   Transfers Overall transfer level: Needs assistance Equipment used: Rolling walker (2 wheeled) Transfers: Sit to/from Stand Sit to Stand: Min guard         General transfer comment: Min guard for steadying assist to stand. Demonstrated safe hand placement.     Balance Overall balance assessment: Needs assistance Sitting-balance support: No upper extremity supported;Feet supported Sitting balance-Leahy Scale: Good     Standing balance support: Bilateral upper extremity supported Standing balance-Leahy Scale: Poor Standing balance comment: Reliant on BUE support                High Level Balance Comments: tolerating fair with RW            ADL either performed or assessed with clinical judgement   ADL Overall ADL's : Needs assistance/impaired     Grooming: Set up;Wash/dry hands;Wash/dry face;Sitting                               Functional mobility during ADLs: Min guard;Rolling walker General ADL Comments: Pt tolerating ADLs well at sink mostly in sitting due to poor activity tolerance in standing. Pt performing ADL functional mobility with minguardA today. Pt requiring motivational cues to continue to perform tasks,     Vision Baseline Vision/History: No visual deficits     Perception     Praxis      Cognition Arousal/Alertness: Awake/alert Behavior During Therapy: Flat affect Overall Cognitive Status: No family/caregiver present to determine baseline cognitive functioning Area of Impairment: Safety/judgement;Orientation;Awareness                         Safety/Judgement: Decreased awareness of safety;Decreased awareness of deficits   Problem Solving: Decreased initiation;Slow processing;Requires verbal cues General Comments: Slow processing noted        Exercises Exercises: General Lower Extremity General Exercises - Lower Extremity Ankle Circles/Pumps: AROM;Both;10 reps Long Arc Quad: AROM;Both;10 reps;Seated   Shoulder Instructions       General Comments      Pertinent Vitals/ Pain       Pain Assessment: Faces Faces Pain Scale: Hurts little more Pain Location: back Pain Descriptors / Indicators: Aching Pain Intervention(s): Limited activity within patient's tolerance  Home Living Family/patient expects to be  discharged to:: Skilled nursing facility                                        Prior Functioning/Environment Level of Independence: Independent with assistive device(s)        Comments: Pt became increasingly angry with any additional questions.   Frequency  Min 3X/week        Progress Toward Goals  OT Goals(current goals can  now be found in the care plan section)     Acute Rehab OT Goals Patient Stated Goal: to feel better OT Goal Formulation: With patient Time For Goal Achievement: 04/27/18 Potential to Achieve Goals: Fair  Plan      Co-evaluation                 AM-PAC OT "6 Clicks" Daily Activity     Outcome Measure   Help from another person eating meals?: None Help from another person taking care of personal grooming?: A Little Help from another person toileting, which includes using toliet, bedpan, or urinal?: A Lot Help from another person bathing (including washing, rinsing, drying)?: A Lot Help from another person to put on and taking off regular upper body clothing?: A Little Help from another person to put on and taking off regular lower body clothing?: A Lot 6 Click Score: 16    End of Session Equipment Utilized During Treatment: Oxygen  OT Visit Diagnosis: Muscle weakness (generalized) (M62.81);Adult, failure to thrive (R62.7)   Activity Tolerance Patient limited by fatigue;Patient tolerated treatment well   Patient Left in bed;with call bell/phone within reach;with bed alarm set;with nursing/sitter in room   Nurse Communication Mobility status        Time: 0938-1829 OT Time Calculation (min): 29 min  Charges: OT General Charges $OT Visit: 1 Visit OT Treatments $Self Care/Home Management : 8-22 mins $Neuromuscular Re-education: 8-22 mins  Andrea [name]) Marsa Aris OTR/L Acute Rehabilitation Services Pager: [phone] Office: [phone]   Fredda Hammed 04/17/2018, 1:48 PM

## 2018-04-18 ENCOUNTER — Inpatient Hospital Stay (HOSPITAL_COMMUNITY): Payer: Medicare Other

## 2018-04-18 LAB — COMPREHENSIVE METABOLIC PANEL
ALT: 11 U/L (ref 0–44)
AST: 23 U/L (ref 15–41)
Albumin: 1.6 g/dL — ABNORMAL LOW (ref 3.5–5.0)
Alkaline Phosphatase: 36 U/L — ABNORMAL LOW (ref 38–126)
Anion gap: 9 (ref 5–15)
BILIRUBIN TOTAL: 0.5 mg/dL (ref 0.3–1.2)
BUN: 46 mg/dL — ABNORMAL HIGH (ref 8–23)
CO2: 30 mmol/L (ref 22–32)
Calcium: 7.9 mg/dL — ABNORMAL LOW (ref 8.9–10.3)
Chloride: 101 mmol/L (ref 98–111)
Creatinine, Ser: 1.81 mg/dL — ABNORMAL HIGH (ref 0.44–1.00)
GFR calc Af Amer: 33 mL/min — ABNORMAL LOW (ref >60)
GFR, EST NON AFRICAN AMERICAN: 28 mL/min — AB (ref >60)
Glucose, Bld: 146 mg/dL — ABNORMAL HIGH (ref 70–99)
Potassium: 3.2 mmol/L — ABNORMAL LOW (ref 3.5–5.1)
Sodium: 140 mmol/L (ref 135–145)
Total Protein: 4.8 g/dL — ABNORMAL LOW (ref 6.5–8.1)

## 2018-04-18 LAB — CBC
HCT: 21.3 % — ABNORMAL LOW (ref 36.0–46.0)
HCT: 24.7 % — ABNORMAL LOW (ref 36.0–46.0)
Hemoglobin: 6.9 g/dL — CL (ref 12.0–15.0)
Hemoglobin: 8.1 g/dL — ABNORMAL LOW (ref 12.0–15.0)
MCH: 29.1 pg (ref 26.0–34.0)
MCH: 28.9 pg (ref 26.0–34.0)
MCHC: 32.4 g/dL (ref 30.0–36.0)
MCHC: 32.8 g/dL (ref 30.0–36.0)
MCV: 89.9 fL (ref 80.0–100.0)
MCV: 88.2 fL (ref 80.0–100.0)
NRBC: 0 % (ref 0.0–0.2)
Platelets: 183 10*3/uL (ref 150–400)
Platelets: 183 10*3/uL (ref 150–400)
RBC: 2.37 MIL/uL — ABNORMAL LOW (ref 3.87–5.11)
RBC: 2.8 MIL/uL — ABNORMAL LOW (ref 3.87–5.11)
RDW: 13.7 % (ref 11.5–15.5)
RDW: 13.9 % (ref 11.5–15.5)
WBC: 4 10*3/uL (ref 4.0–10.5)
WBC: 4.5 10*3/uL (ref 4.0–10.5)
nRBC: 0 % (ref 0.0–0.2)

## 2018-04-18 LAB — GLUCOSE, CAPILLARY
Glucose-Capillary: 130 mg/dL — ABNORMAL HIGH (ref 70–99)
Glucose-Capillary: 97 mg/dL (ref 70–99)
Glucose-Capillary: 169 mg/dL — ABNORMAL HIGH (ref 70–99)
Glucose-Capillary: 111 mg/dL — ABNORMAL HIGH (ref 70–99)

## 2018-04-18 LAB — MAGNESIUM: Magnesium: 1.8 mg/dL (ref 1.7–2.4)

## 2018-04-18 LAB — PREPARE RBC (CROSSMATCH)

## 2018-04-18 LAB — CULTURE, RESPIRATORY W GRAM STAIN: Special Requests: NORMAL

## 2018-04-18 LAB — CULTURE, RESPIRATORY

## 2018-04-18 MED ORDER — SODIUM CHLORIDE 0.9% IV SOLUTION
Freq: Once | INTRAVENOUS | Status: AC
  Administered 2018-04-18: 05:00:00 via INTRAVENOUS

## 2018-04-18 MED ORDER — MELATONIN 5 MG PO TABS
5.0000 mg | ORAL_TABLET | Freq: Every day | ORAL | Status: DC
  Filled 2018-04-18: qty 1

## 2018-04-18 MED ORDER — TRAMADOL HCL 50 MG PO TABS
50.0000 mg | ORAL_TABLET | Freq: Four times a day (QID) | ORAL | Status: DC | PRN
  Administered 2018-04-18 – 2018-04-21 (×3): 50 mg via ORAL
  Filled 2018-04-18 (×3): qty 1

## 2018-04-18 MED ORDER — OXYCODONE HCL 5 MG PO TABS
5.0000 mg | ORAL_TABLET | ORAL | Status: DC | PRN
  Administered 2018-04-18 – 2018-04-23 (×17): 10 mg via ORAL
  Administered 2018-04-23: 5 mg via ORAL
  Administered 2018-04-23 – 2018-04-25 (×6): 10 mg via ORAL
  Administered 2018-04-26 (×2): 5 mg via ORAL
  Filled 2018-04-18: qty 1
  Filled 2018-04-18 (×14): qty 2
  Filled 2018-04-18: qty 1
  Filled 2018-04-18 (×3): qty 2
  Filled 2018-04-18: qty 1
  Filled 2018-04-18 (×6): qty 2
  Filled 2018-04-18: qty 1

## 2018-04-18 MED ORDER — FUROSEMIDE 20 MG PO TABS
20.0000 mg | ORAL_TABLET | Freq: Two times a day (BID) | ORAL | Status: DC
  Administered 2018-04-18 – 2018-04-21 (×7): 20 mg via ORAL
  Filled 2018-04-18 (×7): qty 1

## 2018-04-18 MED ORDER — POTASSIUM CHLORIDE CRYS ER 20 MEQ PO TBCR
40.0000 meq | EXTENDED_RELEASE_TABLET | Freq: Two times a day (BID) | ORAL | Status: DC
  Administered 2018-04-18 – 2018-04-19 (×3): 40 meq via ORAL
  Filled 2018-04-18 (×3): qty 2

## 2018-04-18 MED ORDER — ISOSORBIDE DINITRATE 10 MG PO TABS
20.0000 mg | ORAL_TABLET | Freq: Three times a day (TID) | ORAL | Status: DC
  Administered 2018-04-18 – 2018-04-26 (×25): 20 mg via ORAL
  Filled 2018-04-18 (×3): qty 2
  Filled 2018-04-18: qty 1
  Filled 2018-04-18 (×7): qty 2
  Filled 2018-04-18: qty 1
  Filled 2018-04-18 (×2): qty 2
  Filled 2018-04-18: qty 1
  Filled 2018-04-18 (×8): qty 2
  Filled 2018-04-18: qty 1
  Filled 2018-04-18: qty 2

## 2018-04-18 MED ORDER — CARVEDILOL 12.5 MG PO TABS
12.5000 mg | ORAL_TABLET | Freq: Two times a day (BID) | ORAL | Status: DC
  Administered 2018-04-18 – 2018-04-22 (×8): 12.5 mg via ORAL
  Filled 2018-04-18 (×8): qty 1

## 2018-04-18 MED ORDER — CARVEDILOL 6.25 MG PO TABS
6.2500 mg | ORAL_TABLET | Freq: Once | ORAL | Status: AC
  Administered 2018-04-18: 6.25 mg via ORAL
  Filled 2018-04-18: qty 1

## 2018-04-18 MED ORDER — ALBUTEROL SULFATE (2.5 MG/3ML) 0.083% IN NEBU
2.5000 mg | INHALATION_SOLUTION | RESPIRATORY_TRACT | Status: DC | PRN
  Administered 2018-04-23 – 2018-04-24 (×3): 2.5 mg via RESPIRATORY_TRACT
  Filled 2018-04-18 (×4): qty 3

## 2018-04-18 MED ORDER — LOPERAMIDE HCL 2 MG PO CAPS
2.0000 mg | ORAL_CAPSULE | ORAL | Status: DC | PRN
  Administered 2018-04-18 – 2018-04-19 (×4): 2 mg via ORAL
  Filled 2018-04-18 (×4): qty 1

## 2018-04-18 MED ORDER — HYDROMORPHONE HCL 1 MG/ML IJ SOLN
0.5000 mg | INTRAMUSCULAR | Status: DC | PRN
  Administered 2018-04-19 – 2018-04-23 (×3): 0.5 mg via INTRAVENOUS
  Filled 2018-04-18 (×3): qty 0.5

## 2018-04-18 MED ORDER — MORPHINE SULFATE ER 15 MG PO TBCR
15.0000 mg | EXTENDED_RELEASE_TABLET | Freq: Two times a day (BID) | ORAL | Status: DC
  Administered 2018-04-18 – 2018-04-26 (×16): 15 mg via ORAL
  Filled 2018-04-18 (×16): qty 1

## 2018-04-18 MED ORDER — MELATONIN 3 MG PO TABS
3.0000 mg | ORAL_TABLET | Freq: Every day | ORAL | Status: DC
  Administered 2018-04-18 – 2018-04-25 (×7): 3 mg via ORAL
  Filled 2018-04-18 (×8): qty 1

## 2018-04-18 MED ORDER — MAGNESIUM SULFATE 2 GM/50ML IV SOLN
2.0000 g | Freq: Once | INTRAVENOUS | Status: AC
  Administered 2018-04-18: 2 g via INTRAVENOUS
  Filled 2018-04-18: qty 50

## 2018-04-18 NOTE — Progress Notes (Signed)
Hgb critical 6.9, physician notified and orders placed. Pt has confirmed no religious/ spiritual conflicts.

## 2018-04-18 NOTE — Plan of Care (Signed)
  Problem: Clinical Measurements: Goal: Ability to maintain clinical measurements within normal limits will improve Outcome: Progressing   Problem: Clinical Measurements: Goal: Respiratory complications will improve Outcome: Progressing   Problem: Clinical Measurements: Goal: Cardiovascular complication will be avoided Outcome: Progressing   

## 2018-04-18 NOTE — Progress Notes (Signed)
[place] TEAM 1 - Stepdown/ICU TEAM  Andrea [name]  QMG:[id number] DOB: [birth year] DOA: 04/11/2018 PCP: Medicine, [facility]    Brief Narrative:  [phone] from [facility] w/ a hx of ischemic CHF w/ recent improved EF, 3V CAD s/p PCI of LAD Dec 2019 / not a CABG candidate, HTN, Seizure disorder, and DM who presented with resp distress with sudden worsening of SOB, chest pressure, and leg edema. She was found to be RSV positive, and required noninvasive mechanical ventilation (BiPAP) and diuresis. She was also hospitalized from 2/23 - 3/6 for CHF, AKI, and confusion from narcotics.  Significant Events: 3/21 admit   Subjective: The patient has developed watery diarrhea overnight.  She states this is associated with abdominal cramping and posterior back pain.  There is been no apparent fever or chills.  She denies current shortness of breath stating this has essentially resolved.  She also denies chest pain but reports some low-grade nausea.  She reports ongoing severe low back pain.  Assessment & Plan:  Acute hypoxic respiratory failure - RSV pneumonia + pulmonary edema  Supplemental oxygen requirement is decreasing -wean to room air as able  Acute diastolic CHF - hx of recovered Systolic CHF Dec 1245 (EF 30-35% TTE) EF 03/05/18 noted EF 55-60% w/ mild LVH, normal RV fxn, and no valvular issues - cont to diurese with transition to a maintenance dose -continue to trend weight  Filed Weights   04/13/18 0439 04/15/18 0416 04/16/18 0353  Weight: 74.3 kg 77.7 kg 70.1 kg    CAD S/p PCI Dec 2019 (prox LAD - 2 other lesions not amenable - not CABG candidate) - on Brilinta initially due to ASA allergy, but then reportedly Brilinta was stopped due to "rash" - was on Plavix at time of d/c 03/27/18, but patient tells me it also caused a rash and was therefore discontinued -no complaints of chest pain at this time  Acute kidney injury on CKD Stage 4 Baseline creatinine 1.86 with  GFR of 28 - renal fxn holding steady for now - cont to follow - 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  Recent Labs  Lab 04/14/18 0230 04/15/18 0320 04/16/18 0347 04/17/18 0202 04/18/18 0225  CREATININE 1.93* 2.02* 1.95* 1.88* 1.81*    Uncontrolled HTN BP still poorly controlled - this may be a driving factor for her pulmonary edeam - adjust tx and follow  Diarrhea  Likely related to use of abx earlier in hospital stay - no indication of infectious etiology at this time - dose w/ imodium and follow   Normocytic anemia of unclear etiology   Has required a second unit of blood today, for a total of 2U PRBC thus far - w/ ongoing diuresis her Hgb should be increasing - worried about occult bleeding - anemia panel not obtained this admit (will not do now as pt getting transfusion) - check Fe studies in AM - guaiac stool - CT abdom/pelvis to r/o RPH (on lovenox for DVT prophy)  Type 2 diabetes complicated by CKD Y0D 9/83/3825 was 6.4 - CBG controlled at this time  Chronic back pain  Continue PRN pain medicines  Hx of breast CA s/p B mastectomies + chemo  DVT prophylaxis: lovenox  Code Status: NO CODE - DNR Family Communication: no family present at time of exam  Disposition Plan: Stable for transfer to telemetry bed at this time  Consultants:  none  Antimicrobials:  Zosyn 3/21 > 3/26  Objective: Blood pressure (!) 171/70, pulse 94, temperature  98.1 F (36.7 C), temperature source Oral, resp. rate 18, height 5\' 6"  (1.676 m), weight 70.1 kg, SpO2 94 %.  Intake/Output Summary (Last 24 hours) at 04/18/2018 0815 Last data filed at 04/18/2018 0600 Gross per 24 hour  Intake 245.83 ml  Output 100 ml  Net 145.83 ml   Filed Weights   04/13/18 0439 04/15/18 0416 04/16/18 0353  Weight: 74.3 kg 77.7 kg 70.1 kg    Examination: General: No acute respiratory distress Lungs: fine crackles diffusely - no wheezing Cardiovascular: Regular rate without murmur gallop or  rub normal S1 and S2 Abdomen: Nontender, nondistended, soft, bowel sounds positive, no rebound, no ascites, no appreciable mass Extremities: trace B LE edema   CBC: Recent Labs  Lab 04/12/18 0609 04/17/18 0202 04/18/18 0225  WBC 4.1 5.0 4.0  NEUTROABS  --  2.3  --   HGB 9.6* 8.0* 6.9*  HCT 29.0* 23.8* 21.3*  MCV 92.4 89.5 89.9  PLT 223 210 621   Basic Metabolic Panel: Recent Labs  Lab 04/16/18 0347 04/17/18 0202 04/18/18 0225  NA 139 137 140  K 3.4* 3.5 3.2*  CL 103 98 101  CO2 28 28 30   GLUCOSE 69* 132* 146*  BUN 46* 45* 46*  CREATININE 1.95* 1.88* 1.81*  CALCIUM 8.0* 8.0* 7.9*  MG  --  1.8 1.8  PHOS  --  2.8  --    GFR: Estimated Creatinine Clearance: 28.2 mL/min (A) (by C-G formula based on SCr of 1.81 mg/dL (H)).  Liver Function Tests: Recent Labs  Lab 04/17/18 0202 04/18/18 0225  AST  --  23  ALT  --  11  ALKPHOS  --  36*  BILITOT  --  0.5  PROT  --  4.8*  ALBUMIN 1.8* 1.6*    Cardiac Enzymes: Recent Labs  Lab 04/11/18 1253 04/11/18 1840  TROPONINI 0.05* 0.06*    HbA1C: Hgb A1c MFr Bld  Date/Time Value Ref Range Status  03/15/2018 07:28 AM 6.4 (H) 4.8 - 5.6 % Final    Comment:    (NOTE) Pre diabetes:          5.7%-6.4% Diabetes:              >6.4% Glycemic control for   <7.0% adults with diabetes   12/31/2017 04:17 AM 7.7 (H) 4.8 - 5.6 % Final    Comment:    (NOTE) Pre diabetes:          5.7%-6.4% Diabetes:              >6.4% Glycemic control for   <7.0% adults with diabetes     CBG: Recent Labs  Lab 04/17/18 0755 04/17/18 1319 04/17/18 1642 04/17/18 2119 04/18/18 0723  GLUCAP 112* 128* 133* 117* 130*    Recent Results (from the past 240 hour(s))  Respiratory Panel by PCR     Status: Abnormal   Collection Time: 04/11/18  4:16 AM  Result Value Ref Range Status   Adenovirus NOT DETECTED NOT DETECTED Final   Coronavirus 229E NOT DETECTED NOT DETECTED Final    Comment: (NOTE) The Coronavirus on the Respiratory Panel,  DOES NOT test for the novel  Coronavirus (2019 nCoV)    Coronavirus HKU1 NOT DETECTED NOT DETECTED Final   Coronavirus NL63 NOT DETECTED NOT DETECTED Final   Coronavirus OC43 NOT DETECTED NOT DETECTED Final   Metapneumovirus NOT DETECTED NOT DETECTED Final   Rhinovirus / Enterovirus NOT DETECTED NOT DETECTED Final   Influenza A NOT DETECTED NOT DETECTED Final  Influenza B NOT DETECTED NOT DETECTED Final   Parainfluenza Virus 1 NOT DETECTED NOT DETECTED Final   Parainfluenza Virus 2 NOT DETECTED NOT DETECTED Final   Parainfluenza Virus 3 NOT DETECTED NOT DETECTED Final   Parainfluenza Virus 4 NOT DETECTED NOT DETECTED Final   Respiratory Syncytial Virus DETECTED (A) NOT DETECTED Final    Comment: CRITICAL RESULT CALLED TO, READ BACK BY AND VERIFIED WITH: RN K COBB [id number] MLM    Bordetella pertussis NOT DETECTED NOT DETECTED Final   Chlamydophila pneumoniae NOT DETECTED NOT DETECTED Final   Mycoplasma pneumoniae NOT DETECTED NOT DETECTED Final    Comment: Performed at [facility], [place] [address], [place], [place] [address]  Blood culture (routine x 2)     Status: None   Collection Time: 04/11/18  4:18 AM  Result Value Ref Range Status   Specimen Description BLOOD RIGHT HAND  Final   Special Requests   Final    BOTTLES DRAWN AEROBIC ONLY Blood Culture adequate volume   Culture   Final    NO GROWTH 5 DAYS Performed at [facility], [place] [address], [place], [place] [address]    Report Status 04/16/2018 FINAL  Final  Blood culture (routine x 2)     Status: None   Collection Time: 04/11/18  4:20 AM  Result Value Ref Range Status   Specimen Description BLOOD RIGHT ARM  Final   Special Requests   Final    BOTTLES DRAWN AEROBIC AND ANAEROBIC Blood Culture adequate volume   Culture   Final    NO GROWTH 5 DAYS Performed at [facility], [place] [address] [place], [place] [address]    Report Status 04/16/2018 FINAL  Final  Expectorated sputum assessment w rflx to  resp cult     Status: None   Collection Time: 04/14/18  6:30 AM  Result Value Ref Range Status   Specimen Description SPUTUM  Final   Special Requests Normal  Final   Sputum evaluation   Final    THIS SPECIMEN IS ACCEPTABLE FOR SPUTUM CULTURE Performed at [facility], [place] [address] [place], [place] [address]    Report Status 04/14/2018 FINAL  Final  Culture, respiratory     Status: None (Preliminary result)   Collection Time: 04/14/18  6:30 AM  Result Value Ref Range Status   Specimen Description SPUTUM  Final   Special Requests Normal Reflexed from [id number]  Final   Gram Stain   Final    MODERATE WBC PRESENT,BOTH PMN AND MONONUCLEAR ABUNDANT YEAST WITH PSEUDOHYPHAE    Culture   Final    FEW CANDIDA GLABRATA FEW STAPHYLOCOCCUS AUREUS SUSCEPTIBILITIES TO FOLLOW Performed at [facility], [place] [address], [place],  [address]    Report Status PENDING  Incomplete     Scheduled Meds: . atorvastatin  80 mg Oral QHS  . busPIRone  15 mg Oral BID  . carbamide peroxide  5 drop Right EAR BID  . carvedilol  6.25 mg Oral BID WC  . ciprofloxacin-dexamethasone  4 drop Right EAR BID  . divalproex  1,000 mg Oral Daily  . feeding supplement (ENSURE ENLIVE)  237 mL Oral BID BM  . furosemide  60 mg Intravenous BID  . guaiFENesin  600 mg Oral BID  . hydrALAZINE  100 mg Oral Q8H  . insulin aspart  0-5 Units Subcutaneous QHS  . insulin aspart  0-9 Units Subcutaneous TID WC  . isosorbide mononitrate  60 mg Oral Daily  . levothyroxine  200 mcg Oral Daily  . pantoprazole  40 mg Oral Daily  . sodium chloride flush  3 mL Intravenous Q12H  . sodium chloride flush  3 mL Intravenous Q12H     LOS: 7 days   Cherene Altes, MD [facility] Office  [phone] Pager - Text Page per Amion  If 7PM-7AM, please contact night-coverage per Amion 04/18/2018, 8:15 AM

## 2018-04-18 NOTE — Progress Notes (Signed)
CSW spoke with Peggy at [facility], the currently do not have any beds available.   CSW called and spoke to the patient's son, Andrea [name]. He did not want any more facilities in [place] because they would be to far away for him. CSW emailed him a SNF list for [place]. He stated that he would pick from the new list and inform the CSW of his choices.   CSW will follow up for bed offers on Sunday.   Domenic Schwab, MSW, [place]

## 2018-04-19 ENCOUNTER — Inpatient Hospital Stay (HOSPITAL_COMMUNITY): Payer: Medicare Other

## 2018-04-19 DIAGNOSIS — G8929 Other chronic pain: Secondary | ICD-10-CM

## 2018-04-19 LAB — CBC
HCT: 24.2 % — ABNORMAL LOW (ref 36.0–46.0)
Hemoglobin: 8.2 g/dL — ABNORMAL LOW (ref 12.0–15.0)
MCH: 30 pg (ref 26.0–34.0)
MCHC: 33.9 g/dL (ref 30.0–36.0)
MCV: 88.6 fL (ref 80.0–100.0)
Platelets: 195 10*3/uL (ref 150–400)
RBC: 2.73 MIL/uL — ABNORMAL LOW (ref 3.87–5.11)
RDW: 14.5 % (ref 11.5–15.5)
WBC: 4.4 10*3/uL (ref 4.0–10.5)
nRBC: 0 % (ref 0.0–0.2)

## 2018-04-19 LAB — GLUCOSE, CAPILLARY
GLUCOSE-CAPILLARY: 90 mg/dL (ref 70–99)
Glucose-Capillary: 128 mg/dL — ABNORMAL HIGH (ref 70–99)
Glucose-Capillary: 99 mg/dL (ref 70–99)
Glucose-Capillary: 122 mg/dL — ABNORMAL HIGH (ref 70–99)

## 2018-04-19 LAB — MAGNESIUM: MAGNESIUM: 2.3 mg/dL (ref 1.7–2.4)

## 2018-04-19 LAB — BASIC METABOLIC PANEL
Anion gap: 9 (ref 5–15)
BUN: 44 mg/dL — ABNORMAL HIGH (ref 8–23)
CO2: 29 mmol/L (ref 22–32)
Calcium: 8 mg/dL — ABNORMAL LOW (ref 8.9–10.3)
Chloride: 100 mmol/L (ref 98–111)
Creatinine, Ser: 1.76 mg/dL — ABNORMAL HIGH (ref 0.44–1.00)
GFR calc Af Amer: 34 mL/min — ABNORMAL LOW (ref >60)
GFR calc non Af Amer: 29 mL/min — ABNORMAL LOW (ref >60)
Glucose, Bld: 102 mg/dL — ABNORMAL HIGH (ref 70–99)
POTASSIUM: 4.2 mmol/L (ref 3.5–5.1)
Sodium: 138 mmol/L (ref 135–145)

## 2018-04-19 MED ORDER — IPRATROPIUM-ALBUTEROL 0.5-2.5 (3) MG/3ML IN SOLN
3.0000 mL | Freq: Four times a day (QID) | RESPIRATORY_TRACT | Status: DC
  Administered 2018-04-19 – 2018-04-21 (×5): 3 mL via RESPIRATORY_TRACT
  Filled 2018-04-19 (×7): qty 3

## 2018-04-19 MED ORDER — IPRATROPIUM-ALBUTEROL 0.5-2.5 (3) MG/3ML IN SOLN: 3.0000 mL | Freq: Four times a day (QID) | RESPIRATORY_TRACT | Status: DC

## 2018-04-19 NOTE — Progress Notes (Signed)
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 She was found to be RSV positive, and required noninvasive mechanical ventilation (BiPAP) and diuresis. She was also recently hospitalized from2/23 - 3/6 for CHF, AKI, and confusion from narcotics.    Was admitted on 04/11/2018 and respiratory status is slowly improving and she feels 50% better but has developed watery diarrhea very overnight.  She is also associated this with cramping and back pain.  She was given a dose of loperamide yesterday with improvement of her symptoms.   Assessment & Plan:   Principal Problem:   Acute hypoxemic respiratory failure (HCC) Active Problems:   Chronic pain   Chest pain   Acute respiratory failure with hypoxia (HCC)   Anemia   Acute on chronic diastolic CHF (congestive heart failure) (HCC)   RSV (respiratory syncytial virus pneumonia)   DM type 2 (diabetes mellitus, type 2) (HCC)   CKD (chronic kidney disease), stage IV (HCC)  Acute Hypoxic Respiratory Failure - RSV pneumonia+ pulmonary edema in the setting of Acute on Chronic Diastolic CHF -Presented with hypoxia, tachycardia, tachypnea placed on BiPAP -Chest x-ray done on admission which revealed bilateral pulmonary infiltrates suggestive of multilobar pneumonia -Respiratory panel positive for RSV -Completed 5 days of IV Zosyn  -Blood cultures x2 04/11/2018 showed NGTD at 5 Dasy -Sputum Gram Stain showed Moderate WBC Present, Both PMN and Mononuclear with Abudant Yeast with Psuedohyphae and Cx showed Few Candida Glabrata and Few  MRSA which was Resistant to Ciprofloxacin, Erythromycin, and Oxacillin; ? if she is colonized with MRSA -Supplemental oxygen requirement is decreasing -wean to room air as able and was on 1-2 Liters this AM  -Supplemental oxygen via nasal cannula and wean O2 as tolerated -Continuous pulse oximetry and maintain O2 saturation greater than 90% -Chest x-ray on 04/17/2018 showed some interval worsening of bilateral edema and infiltrates and there is also some bilateral pleural effusions with bibasilar atelectasis or consolidation -CT Scan of Abdomen and Pelvis showed Small bilateral pleural effusions with bibasilar collapse/consolidative change. Small volume abdominal ascites -Will add DuoNeb q6h and continue Albuterol 2.5 mg Neb q2hprn -Will Add Flutter Valve and Incentive Spirometry -Will repeat a two-view chest x-ray today   Acute on Chronic Diastolic CHF  -Hx of recovered Systolic CHF Dec 8250 (EF 30-35% TTE) -EF 03/05/18 noted EF 55-60% w/ mild LVH, normal RV fxn, and no valvular issues  -BNP on admission was 1,045 and worsened to 3,208.7; Will repeat in AM  -Cont to diurese with transition to a maintenance dose and is now on Furosemide 20 mg po BID -Strict I's/O's and Daily Weights -C/w Carvedilol 12.5 mg po BID, Hydralazine 100 mg po q8h, and Isosorbide Dintrate 20 mg po TID -Patient is -6 lbs since admission and is +934 mL since Admission -Continue to Monitor Volume Status Carefully and watch for S/Sx of Volume Overload   CAD -S/p PCI Dec 2019 (prox LAD - 2 other lesions not amenable - not CABG candidate)  -On Brilinta initially due to ASA allergy, but then reportedly Brilinta was stopped due to "rash"  -Was on Plavix  at time of d/c 03/27/18, but patient told Dr. 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 -BUN/Cr went from  36/21.3 on Admission and is now 44/1.76 today -Continue to Monitor and Trend Renal Fxn -Repeat CMP in AM   Uncontrolled HTN -BP was still poorly controlled  -This may be a driving factor for her pulmonary edeam  -C/w Furosemide 20 mg po BID,  Carvedilol 12.5 mg po BID, Hydralazine 100 mg po q8h, and Isosorbide Dintrate 20 mg po TID -Has IV Hydralazine 5 mg q4hprn for SBP>180  Diarrhea  -Likely related to use of abx earlier in hospital stay  -No indication of infectious etiology at this time but has a Hx of C Difficle Colitis in February 2019 -Started on Loperamide 2 mg po PRN Diarrhea and Loose Stools  -Currently Afebrile and Has no Leukocytosis -Continue to Monitor Carefully   Normocytic anemia of unclear etiology  /Anemia of Chronic Kidney Disease Stage 5  -Typed and Screened and transfused 2 units of pRBC's -With ongoing diuresis her Hgb should be increasing -There was concern about occult bleeding  -Anemia panel not obtained this admit (will not do now as pt getting transfusion)  -CT abdom/pelvis to r/o RPH (on lovenox for DVT prophy) showed No evidence of intraabdominal/pelvic hemorrhage or explanation for decreased hemoglobin. -Hgb/Hct trended down to 6.9/21.3 and after 2 units is now 8.2/24.2 -Continue to monitor for S/Sx of Bleeding -Repeat CBC in AM   Type 2 diabetes complicated by CKD -XHB7J 03/15/2018 was 6.4  -CBG controlled at this time and ranging from 90-169 -C/w sensitive NovoLog sliding scale before meals and at bedtime  Hypokalemia, improved -Improved after Repletion.  K+ is now 4.2 -Continue monitor replete as necessary -We will repeat CMP in the a.m.  Chronic Back Pain  -Continue Home medications MS Contin 15 mg p.o. twice daily, Dilaudid 0.5 mg every 4 PRN  for breakthrough pain, oxycodone 5 to 10 mg p.o. every 4 PRN for severe pain as well as tramadol 50 mg p.o. every 6 hours as needed moderate pain  Hx of Breast CA s/p Bilateral mastectomies + chemo -Will need outpatient follow up with PCP   Hypothyroidism -TSH was not done on admission -C/w Levothyroxine 200 mcg po Daily   History of Seizures/Seizure Disorder and migraines -C/w Divalproex 1000 mg p.o. daily -Has not had a Seizure in 10 years   Hard of hearing/Ear Congestion -Continue with Debrox as well as Ciprodex  Dyslipidemia -C/w with AtorvaStatin 80 mg po qHS  Insomnia -Continue with Melatonin 3 mg po qHS  Generalized Anxiety Disorder -Continue with BuSpirone 15 mg po BID   GERD -C/w Pantoprazole 40 mg po BID  DVT prophylaxis: SCDs given drop in Hb/Hct Code Status: FULL CODE  Family Communication: No family present at bedside  Disposition Plan: SNF at D/C  Consultants:   None   Procedures:  None  Antimicrobials:  Anti-infectives (From admission, onward)   Start     Dose/Rate Route Frequency Ordered Stop   04/12/18 0600  piperacillin-tazobactam (ZOSYN) IVPB 3.375 g     3.375 g 12.5 mL/hr over 240 Minutes Intravenous Every 8  hours 04/12/18 0548 04/17/18 1251     Subjective: Seen and examined at bedside she was little hard of hearing.  Complains of still having some shortness of breath but is better than before.  Still very fatigued and not sleeping well.  No nausea or vomiting.  No other concerns or complaints at this time.  Objective: Vitals:   04/19/18 0646 04/19/18 0737 04/19/18 0746 04/19/18 1307  BP: (!) 192/139 (!) 188/86 (!) 136/97 128/83  Pulse: 79 85 87 78  Resp: 20 18  18   Temp: 98 F (36.7 C) 98.1 F (36.7 C)  (!) 97.4 F (36.3 C)  TempSrc: Oral Oral  Oral  SpO2: 97% 94% 92% 97%  Weight: 70.1 kg     Height: 5\' 4"  (1.626 m)       Intake/Output Summary (Last 24 hours) at 04/19/2018 1359 Last data filed at 04/19/2018 3903 Gross per 24  hour  Intake 1297 ml  Output 150 ml  Net 1147 ml   Filed Weights   04/16/18 0353 04/19/18 0500 04/19/18 0646  Weight: 70.1 kg 70 kg 70.1 kg   Examination: Physical Exam:  Constitutional: WN/WD overweight Caucasian female in NAD and appears calm but uncomfortable Eyes: Lids and conjunctivae normal, sclerae anicteric  ENMT: External Ears, Nose appear normal. Somewhat hard of hearing. Mucous membranes are moist.  Neck: Appears normal, supple, no cervical masses, normal ROM, no appreciable thyromegaly, no JVD Respiratory: Diminished to auscultation bilaterally with coarse breath sounds and mild crackles scattered, no wheezing, rales, rhonchi. Normal respiratory effort and patient is not tachypenic. No accessory muscle use. Wearing 1 Liter of Supplemental O2 via Box Cardiovascular: RRR, no murmurs / rubs / gallops. S1 and S2 auscultated. Trace LE extremity edema Abdomen: Soft, non-tender, Distended slightly due to body habiuts. No masses palpated. No appreciable hepatosplenomegaly. Bowel sounds positive.  GU: Deferred. Musculoskeletal: No clubbing / cyanosis of digits/nails. No joint deformity upper and lower extremities. Skin: No rashes, lesions, ulcers on a limited skin evaluation. No induration; Warm and dry.  Neurologic: CN 2-12 grossly intact with no focal deficits. Romberg sign and cerebellar reflexes not assessed.  Psychiatric: Normal judgment and insight. Alert and oriented x 3. Normal mood and appropriate affect.   Data Reviewed: I have personally reviewed following labs and imaging studies  CBC: Recent Labs  Lab 04/17/18 0202 04/18/18 0225 04/18/18 1516 04/19/18 0219  WBC 5.0 4.0 4.5 4.4  NEUTROABS 2.3  --   --   --   HGB 8.0* 6.9* 8.1* 8.2*  HCT 23.8* 21.3* 24.7* 24.2*  MCV 89.5 89.9 88.2 88.6  PLT 210 183 183 009   Basic Metabolic Panel: Recent Labs  Lab 04/15/18 0320 04/16/18 0347 04/17/18 0202 04/18/18 0225 04/19/18 0219  NA 134* 139 137 140 138  K 3.7 3.4*  3.5 3.2* 4.2  CL 101 103 98 101 100  CO2 28 28 28 30 29   GLUCOSE 103* 69* 132* 146* 102*  BUN 49* 46* 45* 46* 44*  CREATININE 2.02* 1.95* 1.88* 1.81* 1.76*  CALCIUM 7.6* 8.0* 8.0* 7.9* 8.0*  MG  --   --  1.8 1.8 2.3  PHOS  --   --  2.8  --   --    GFR: Estimated Creatinine Clearance: 29.8 mL/min (A) (by C-G formula based on SCr of 1.76 mg/dL (H)). Liver Function Tests: Recent Labs  Lab 04/17/18 0202 04/18/18 0225  AST  --  23  ALT  --  11  ALKPHOS  --  36*  BILITOT  --  0.5  PROT  --  4.8*  ALBUMIN 1.8* 1.6*   No results for input(s): LIPASE, AMYLASE in the last 168 hours. No results for input(s): AMMONIA in the last 168 hours. Coagulation Profile: No results for input(s): INR, PROTIME in the last 168 hours. Cardiac Enzymes: No results for input(s): CKTOTAL, CKMB, CKMBINDEX, TROPONINI in the last 168 hours. BNP (last 3 results) No results for input(s): PROBNP in the last 8760 hours. HbA1C: No results for input(s): HGBA1C in the last 72 hours. CBG: Recent Labs  Lab 04/18/18 1221 04/18/18 1642 04/18/18 2104 04/19/18 0656 04/19/18 1147  GLUCAP 97 169* 111* 90 128*   Lipid Profile: No results for input(s): CHOL, HDL, LDLCALC, TRIG, CHOLHDL, LDLDIRECT in the last 72 hours. Thyroid Function Tests: No results for input(s): TSH, T4TOTAL, FREET4, T3FREE, THYROIDAB in the last 72 hours. Anemia Panel: No results for input(s): VITAMINB12, FOLATE, FERRITIN, TIBC, IRON, RETICCTPCT in the last 72 hours. Sepsis Labs: Recent Labs  Lab 04/14/18 0619  PROCALCITON 1.43  LATICACIDVEN 0.5    Recent Results (from the past 240 hour(s))  Respiratory Panel by PCR     Status: Abnormal   Collection Time: 04/11/18  4:16 AM  Result Value Ref Range Status   Adenovirus NOT DETECTED NOT DETECTED Final   Coronavirus 229E NOT DETECTED NOT DETECTED Final    Comment: (NOTE) The Coronavirus on the Respiratory Panel, DOES NOT test for the novel  Coronavirus (2019 nCoV)    Coronavirus  HKU1 NOT DETECTED NOT DETECTED Final   Coronavirus NL63 NOT DETECTED NOT DETECTED Final   Coronavirus OC43 NOT DETECTED NOT DETECTED Final   Metapneumovirus NOT DETECTED NOT DETECTED Final   Rhinovirus / Enterovirus NOT DETECTED NOT DETECTED Final   Influenza A NOT DETECTED NOT DETECTED Final   Influenza B NOT DETECTED NOT DETECTED Final   Parainfluenza Virus 1 NOT DETECTED NOT DETECTED Final   Parainfluenza Virus 2 NOT DETECTED NOT DETECTED Final   Parainfluenza Virus 3 NOT DETECTED NOT DETECTED Final   Parainfluenza Virus 4 NOT DETECTED NOT DETECTED Final   Respiratory Syncytial Virus DETECTED (A) NOT DETECTED Final    Comment: CRITICAL RESULT CALLED TO, READ BACK BY AND VERIFIED WITH: RN K COBB [phone] MLM    Bordetella pertussis NOT DETECTED NOT DETECTED Final   Chlamydophila pneumoniae NOT DETECTED NOT DETECTED Final   Mycoplasma pneumoniae NOT DETECTED NOT DETECTED Final    Comment: Performed at [facility], [address] [place], [place] [address]  Blood culture (routine x 2)     Status: None   Collection Time: 04/11/18  4:18 AM  Result Value Ref Range Status   Specimen Description BLOOD RIGHT HAND  Final   Special Requests   Final    BOTTLES DRAWN AEROBIC ONLY Blood Culture adequate volume   Culture   Final    NO GROWTH 5 DAYS Performed at [facility], [place] [address], [place], [place] [address]    Report Status 04/16/2018 FINAL  Final  Blood culture (routine x 2)     Status: None   Collection Time: 04/11/18  4:20 AM  Result Value Ref Range Status   Specimen Description BLOOD RIGHT ARM  Final   Special Requests   Final    BOTTLES DRAWN AEROBIC AND ANAEROBIC Blood Culture adequate volume   Culture   Final    NO GROWTH 5 DAYS Performed at [facility], [place] [address], [place], [place]  [address]    Report Status 04/16/2018 FINAL  Final  Expectorated sputum assessment w rflx to resp cult     Status: None   Collection Time: 04/14/18  6:30 AM    Result Value Ref Range Status   Specimen Description SPUTUM  Final   Special Requests Normal  Final   Sputum evaluation   Final    THIS SPECIMEN IS ACCEPTABLE FOR SPUTUM CULTURE Performed at [facility], [place] [address] [place], [place] [address]    Report Status 04/14/2018 FINAL  Final  Culture, respiratory     Status: None   Collection Time: 04/14/18  6:30 AM  Result Value Ref Range Status   Specimen Description SPUTUM  Final   Special Requests Normal Reflexed from [id number]  Final   Gram Stain   Final    MODERATE WBC PRESENT,BOTH PMN AND MONONUCLEAR ABUNDANT YEAST WITH PSEUDOHYPHAE Performed at [facility], [place] [address] [place], [place] [address]    Culture   Final    FEW CANDIDA GLABRATA FEW METHICILLIN RESISTANT STAPHYLOCOCCUS AUREUS    Report Status 04/18/2018 FINAL  Final   Organism ID, Bacteria METHICILLIN RESISTANT STAPHYLOCOCCUS AUREUS  Final      Susceptibility   Methicillin resistant staphylococcus aureus - MIC*    CIPROFLOXACIN >=8 RESISTANT Resistant     ERYTHROMYCIN >=8 RESISTANT Resistant     GENTAMICIN <=0.5 SENSITIVE Sensitive     OXACILLIN >=4 RESISTANT Resistant     TETRACYCLINE <=1 SENSITIVE Sensitive     VANCOMYCIN 1 SENSITIVE Sensitive     TRIMETH/SULFA <=10 SENSITIVE Sensitive     CLINDAMYCIN <=0.25 SENSITIVE Sensitive     RIFAMPIN <=0.5 SENSITIVE Sensitive     Inducible Clindamycin NEGATIVE Sensitive     * FEW METHICILLIN RESISTANT STAPHYLOCOCCUS AUREUS     Estimated body mass index is 26.54 kg/m as calculated from the following:   Height as of this encounter: 5\' 4"  (1.626 m).   Weight as of this encounter: 70.1 kg.  Malnutrition Type:  Nutrition Problem: Inadequate oral intake Etiology: decreased appetite   Malnutrition Characteristics:  Signs/Symptoms: per patient/family report, meal completion < 50%   Nutrition Interventions:  Interventions: Glucerna shake   Radiology Studies: Ct Abdomen Pelvis Wo  Contrast  Result Date: 04/18/2018 CLINICAL DATA:  Diffuse abdominal pain. Decreased hemoglobin. Rule out retroperitoneal hemorrhage. Prior hysterectomy. Appendectomy. Cholecystectomy. Bilateral mastectomy. EXAM: CT ABDOMEN AND PELVIS WITHOUT CONTRAST TECHNIQUE: Multidetector CT imaging of the abdomen and pelvis was performed following the standard protocol without IV contrast. COMPARISON:  Plain films 03/22/2018.  CT of 03/01/2018. FINDINGS: Lower chest: Left greater than right lower lobe airspace disease. Increased compared to 03/01/2018. Bilateral breast implants. Mild cardiomegaly with multivessel coronary artery atherosclerosis. Small bilateral pleural effusions. Hepatobiliary: Normal liver. Cholecystectomy, without biliary ductal dilatation. Pancreas: Normal, without mass or ductal dilatation. Spleen: Normal in size, without focal abnormality. Adrenals/Urinary Tract: Normal adrenal glands. Left kidney is pelvic in position. Has a similar atypical appearance with suggestion of 2 moieties. Example images 52 and 60 of series 3. No hydronephrosis. Interpolar right renal cyst. No bladder calculi. Stomach/Bowel: Normal stomach, without wall thickening. Colonic stool burden suggests constipation. Normal small bowel. Vascular/Lymphatic: Aortic and branch vessel atherosclerosis. No abdominopelvic adenopathy. Reproductive: Hysterectomy.  No adnexal mass. Other: No free pelvic fluid. There is small volume perihepatic ascites. No free intraperitoneal air. No evidence of intraperitoneal or retroperitoneal hemorrhage. Musculoskeletal: Anasarca. Superior endplate compression deformity is mild at L3. Similar. IMPRESSION: 1. No evidence of intraabdominal/pelvic  hemorrhage or explanation for decreased hemoglobin. 2. Small bilateral pleural effusions with bibasilar collapse/consolidative change. Small volume abdominal ascites. 3. Upper pelvic left kidney, without hydronephrosis. 4. Coronary artery atherosclerosis. Aortic  Atherosclerosis (ICD10-I70.0). Electronically Signed   By: Abigail Miyamoto M.D.   On: 04/18/2018 11:07   Scheduled Meds:  atorvastatin  80 mg Oral QHS   busPIRone  15 mg Oral BID   carbamide peroxide  5 drop Right EAR BID   carvedilol  12.5 mg Oral BID WC   ciprofloxacin-dexamethasone  4 drop Right EAR BID   divalproex  1,000 mg Oral Daily   feeding supplement (ENSURE ENLIVE)  237 mL Oral BID BM   furosemide  20 mg Oral BID   guaiFENesin  600 mg Oral BID   hydrALAZINE  100 mg Oral Q8H   insulin aspart  0-5 Units Subcutaneous QHS   insulin aspart  0-9 Units Subcutaneous TID WC   isosorbide dinitrate  20 mg Oral TID   levothyroxine  200 mcg Oral Daily   Melatonin  3 mg Oral QHS   morphine  15 mg Oral BID   pantoprazole  40 mg Oral Daily   potassium chloride  40 mEq Oral BID   sodium chloride flush  3 mL Intravenous Q12H   Continuous Infusions:   LOS: 8 days   Kerney Elbe, DO [facility] PAGER is on AMION  If 7PM-7AM, please contact night-coverage [email or web address] Password [facility] 04/19/2018, 1:59 PM

## 2018-04-19 NOTE — Progress Notes (Signed)
Patient was given PRNs for pain and Zofran iv for nausea, No VTE ordered and patient refused to use them.

## 2018-04-19 NOTE — Progress Notes (Signed)
Called RT Molly to advise of new order for neb tx, flutter valve and IS

## 2018-04-20 ENCOUNTER — Inpatient Hospital Stay (HOSPITAL_COMMUNITY): Payer: Medicare Other

## 2018-04-20 DIAGNOSIS — R062 Wheezing: Secondary | ICD-10-CM

## 2018-04-20 LAB — CBC WITH DIFFERENTIAL/PLATELET
Abs Immature Granulocytes: 0.03 10*3/uL (ref 0.00–0.07)
BASOS ABS: 0 10*3/uL (ref 0.0–0.1)
Basophils Relative: 0 %
Eosinophils Absolute: 0.1 10*3/uL (ref 0.0–0.5)
Eosinophils Relative: 3 %
HCT: 27.3 % — ABNORMAL LOW (ref 36.0–46.0)
Hemoglobin: 8.4 g/dL — ABNORMAL LOW (ref 12.0–15.0)
Immature Granulocytes: 1 %
LYMPHS ABS: 1.9 10*3/uL (ref 0.7–4.0)
LYMPHS PCT: 36 %
MCH: 27.6 pg (ref 26.0–34.0)
MCHC: 30.8 g/dL (ref 30.0–36.0)
MCV: 89.8 fL (ref 80.0–100.0)
Monocytes Absolute: 0.7 10*3/uL (ref 0.1–1.0)
Monocytes Relative: 14 %
Neutro Abs: 2.5 10*3/uL (ref 1.7–7.7)
Neutrophils Relative %: 46 %
Platelets: 226 10*3/uL (ref 150–400)
RBC: 3.04 MIL/uL — ABNORMAL LOW (ref 3.87–5.11)
RDW: 14.3 % (ref 11.5–15.5)
WBC: 5.3 10*3/uL (ref 4.0–10.5)
nRBC: 0 % (ref 0.0–0.2)

## 2018-04-20 LAB — GLUCOSE, CAPILLARY
Glucose-Capillary: 106 mg/dL — ABNORMAL HIGH (ref 70–99)
Glucose-Capillary: 99 mg/dL (ref 70–99)
Glucose-Capillary: 160 mg/dL — ABNORMAL HIGH (ref 70–99)
Glucose-Capillary: 189 mg/dL — ABNORMAL HIGH (ref 70–99)

## 2018-04-20 LAB — COMPREHENSIVE METABOLIC PANEL
ALT: 12 U/L (ref 0–44)
AST: 26 U/L (ref 15–41)
Albumin: 2 g/dL — ABNORMAL LOW (ref 3.5–5.0)
Alkaline Phosphatase: 45 U/L (ref 38–126)
Anion gap: 7 (ref 5–15)
BUN: 46 mg/dL — ABNORMAL HIGH (ref 8–23)
CALCIUM: 8.1 mg/dL — AB (ref 8.9–10.3)
CO2: 28 mmol/L (ref 22–32)
Chloride: 99 mmol/L (ref 98–111)
Creatinine, Ser: 1.67 mg/dL — ABNORMAL HIGH (ref 0.44–1.00)
GFR calc Af Amer: 36 mL/min — ABNORMAL LOW (ref >60)
GFR calc non Af Amer: 31 mL/min — ABNORMAL LOW (ref >60)
Glucose, Bld: 113 mg/dL — ABNORMAL HIGH (ref 70–99)
Potassium: 5.4 mmol/L — ABNORMAL HIGH (ref 3.5–5.1)
SODIUM: 134 mmol/L — AB (ref 135–145)
Total Bilirubin: 0.4 mg/dL (ref 0.3–1.2)
Total Protein: 5.2 g/dL — ABNORMAL LOW (ref 6.5–8.1)

## 2018-04-20 LAB — TYPE AND SCREEN
ABO/RH(D): B POS
Antibody Screen: NEGATIVE
Unit division: 0

## 2018-04-20 LAB — BPAM RBC
Blood Product Expiration Date: 202004132359
ISSUE DATE / TIME: 202003281048
Unit Type and Rh: 7300

## 2018-04-20 LAB — MAGNESIUM: Magnesium: 2.1 mg/dL (ref 1.7–2.4)

## 2018-04-20 LAB — PHOSPHORUS: Phosphorus: 3.2 mg/dL (ref 2.5–4.6)

## 2018-04-20 MED ORDER — HYDROCOD POLST-CPM POLST ER 10-8 MG/5ML PO SUER
5.0000 mL | Freq: Two times a day (BID) | ORAL | Status: DC | PRN
  Administered 2018-04-20 – 2018-04-25 (×5): 5 mL via ORAL
  Filled 2018-04-20 (×6): qty 5

## 2018-04-20 MED ORDER — ARFORMOTEROL TARTRATE 15 MCG/2ML IN NEBU
15.0000 ug | INHALATION_SOLUTION | Freq: Two times a day (BID) | RESPIRATORY_TRACT | Status: DC
  Administered 2018-04-20 – 2018-04-26 (×11): 15 ug via RESPIRATORY_TRACT
  Filled 2018-04-20 (×13): qty 2

## 2018-04-20 MED ORDER — BENZONATATE 100 MG PO CAPS
200.0000 mg | ORAL_CAPSULE | Freq: Three times a day (TID) | ORAL | Status: DC | PRN
  Administered 2018-04-20 – 2018-04-25 (×9): 200 mg via ORAL
  Filled 2018-04-20 (×9): qty 2

## 2018-04-20 MED ORDER — FUROSEMIDE 10 MG/ML IJ SOLN
20.0000 mg | Freq: Once | INTRAMUSCULAR | Status: AC
  Administered 2018-04-20: 20 mg via INTRAVENOUS
  Filled 2018-04-20: qty 2

## 2018-04-20 MED ORDER — GUAIFENESIN ER 600 MG PO TB12
1200.0000 mg | ORAL_TABLET | Freq: Two times a day (BID) | ORAL | Status: DC
  Administered 2018-04-20 – 2018-04-26 (×12): 1200 mg via ORAL
  Filled 2018-04-20 (×12): qty 2

## 2018-04-20 MED ORDER — METHYLPREDNISOLONE SODIUM SUCC 40 MG IJ SOLR
40.0000 mg | Freq: Two times a day (BID) | INTRAMUSCULAR | Status: DC
  Administered 2018-04-20 – 2018-04-24 (×10): 40 mg via INTRAVENOUS
  Filled 2018-04-20 (×11): qty 1

## 2018-04-20 MED ORDER — BUDESONIDE 0.5 MG/2ML IN SUSP
0.5000 mg | Freq: Two times a day (BID) | RESPIRATORY_TRACT | Status: DC
  Administered 2018-04-20 – 2018-04-26 (×11): 0.5 mg via RESPIRATORY_TRACT
  Filled 2018-04-20 (×13): qty 2

## 2018-04-20 NOTE — Progress Notes (Signed)
Pt is progressing well. Performed dressing, toileting on BSC and stood to wash her hands with supervision and use of RW on 2L 02. Tolerated well.    04/20/18 1100  OT Visit Information  Last OT Received On 04/20/18  Assistance Needed +1  History of Present Illness 67yow from [facility] PMHdiastolic CHF, 3VCAD, ischemiccardiomyopathypresented via EMS with resp distress, hypoxia tachypnea, rales, "fluid on her". Treated with BiPAP and now weaned to [facility].  Precautions  Precautions Fall  Precaution Comments watch 02  Pain Assessment  Pain Assessment No/denies pain  Cognition  Arousal/Alertness Awake/alert  Behavior During Therapy Flat affect  Overall Cognitive Status Within Functional Limits for tasks assessed  ADL  Overall ADL's  Needs assistance/impaired  Grooming Wash/dry hands;Standing;Supervision/safety  Upper Body Dressing  Set up;Sitting  Upper Body Dressing Details (indicate cue type and reason) front opening gown  Lower Body Dressing Set up;Sitting/lateral leans  Lower Body Dressing Details (indicate cue type and reason) socks  Toilet Transfer Supervision/safety;Ambulation;RW;BSC  Toileting- Technical sales engineer;Sit to/from stand  Functional mobility during ADLs Supervision/safety;Rolling walker  General ADL Comments Pt maintaining Sp02 in mid 90s on 2L.  Bed Mobility  General bed mobility comments sitting at EOB with RN   Balance  Overall balance assessment Needs assistance  Sitting balance-Leahy Scale Good  Standing balance-Leahy Scale Fair  Standing balance comment can release walker at sink without LOB  Transfers  Overall transfer level Needs assistance  Equipment used Rolling walker (2 wheeled)  Transfers Sit to/from Stand  Sit to Stand Supervision  OT - End of Session  Equipment Utilized During Treatment Rolling walker;Oxygen  Activity Tolerance Patient tolerated treatment well  Patient left in chair;with call bell/phone within  reach  OT Assessment/Plan  OT Plan Discharge plan remains appropriate  OT Visit Diagnosis Muscle weakness (generalized) (M62.81);Adult, failure to thrive (R62.7)  OT Frequency (ACUTE ONLY) Min 3X/week  Follow Up Recommendations SNF;Supervision/Assistance - 24 hour  AM-PAC OT "6 Clicks" Daily Activity Outcome Measure (Version 2)  Help from another person eating meals? 4  Help from another person taking care of personal grooming? 3  Help from another person toileting, which includes using toliet, bedpan, or urinal? 3  Help from another person bathing (including washing, rinsing, drying)? 3  Help from another person to put on and taking off regular upper body clothing? 4  Help from another person to put on and taking off regular lower body clothing? 3  6 Click Score 20  OT Goal Progression  Progress towards OT goals Progressing toward goals  Acute Rehab OT Goals  Patient Stated Goal to feel better  OT Goal Formulation With patient  Time For Goal Achievement 04/27/18  Potential to Achieve Goals Good  OT Time Calculation  OT Start Time (ACUTE ONLY) 1101  OT Stop Time (ACUTE ONLY) 1118  OT Time Calculation (min) 17 min  Nestor Lewandowsky, OTR/L Acute Rehabilitation Services Pager: [phone] Office: [phone]

## 2018-04-20 NOTE — TOC Progression Note (Addendum)
Transition of Care [facility]) - Progression Note    Patient Details  Name: Andrea [name] MRN: [id number] Date of Birth: December 13, 1950  Transition of [facility]) CM/SW [place], LCSW Phone Number: 04/20/2018, 10:16 AM  Clinical Narrative: [name] MD is not accepting any patients that came in with respiratory issues due to COVID-19 pandemic. Called son [name] and made him aware. Next preference is [facility]. Spoke with admissions coordinator and faxed over referral. Notified [name] that she only has about 5 rehab days left that would be covered at 100% by insurance. She does not have secondary insurance to cover 20% copay (about $160 per day). He and his brother have talked about placing patient in an ILF. They are considering getting rid of her apartment which is about $1200 per month and selling her car.  1:21 pm: [facility] unable to make a bed offer. Left voicemail for [facility] SNF to find out about bed availability.  2:58 pm: Received call back from [facility] admissions coordinator. Faxed over referral.  Expected Discharge Plan: Skilled Nursing Facility Barriers to Discharge: Insurance Authorization  Expected Discharge Plan and Services Expected Discharge Plan: [place] In-house Referral: NA Discharge Planning Services: NA Post Acute Care Choice: NA Living arrangements for the past 2 months: [place], [place]                 DME Arranged: N/A DME Agency: NA HH Arranged: NA HH Agency: NA   Social Determinants of Health (SDOH) Interventions    Readmission Risk Interventions Readmission Risk Prevention Plan 04/12/2018  Transportation Screening Complete  Medication Review Press photographer) Complete  PCP or Specialist appointment within 3-5 days of discharge Complete  HRI or [place] Complete  SW Recovery Care/Counseling Consult Complete  [place] Complete  Some recent data might be hidden

## 2018-04-20 NOTE — Progress Notes (Signed)
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 She was found to be RSV positive, and required noninvasive mechanical ventilation (BiPAP) and diuresis. She was also recently hospitalized from2/23 - 3/6 for CHF, AKI, and confusion from narcotics.    Was admitted on 04/11/2018 and respiratory status is slowly improving and she feels 50% better but has developed watery diarrhea which has improved as well.  She is also associated this with cramping and back pain.  She was given a dose of loperamide yesterday with improvement of her symptoms.  Respiratory status is still slow to improve so medications and breathing treatments have been adjusted and she was started on Steroids.   Assessment & Plan:   Principal Problem:   Acute hypoxemic respiratory failure (HCC) Active Problems:   Chronic pain   Chest pain   Acute respiratory failure with hypoxia (HCC)   Anemia   Acute on chronic diastolic CHF (congestive heart failure) (HCC)   RSV (respiratory syncytial virus pneumonia)   DM type 2 (diabetes mellitus, type 2) (HCC)   CKD (chronic kidney disease), stage IV (HCC)  Acute Hypoxic Respiratory Failure - RSV pneumonia+ pulmonary edema in the setting of Acute on Chronic Diastolic CHF -Presented with hypoxia, tachycardia, tachypnea placed on BiPAP; Now weaned off of BiPAP and is on 2 Liters  -Chest x-ray done on admission which revealed bilateral pulmonary infiltrates suggestive of multilobar pneumonia -Respiratory panel positive for RSV -Completed 5 days of IV Zosyn  -Blood  cultures x2 04/11/2018 showed NGTD at 5 Dasy -Sputum Gram Stain showed Moderate WBC Present, Both PMN and Mononuclear with Abudant Yeast with Psuedohyphae and Cx showed Few Candida Glabrata and Few MRSA which was Resistant to Ciprofloxacin, Erythromycin, and Oxacillin; ? if she is colonized with MRSA;  -I discussed the case with Infectious Diseases Dr. Michel Bickers who feels that the Candida glabrata is insignificant and the MRSA is likely a colonization as she has been afebrile with no leukocytosis and no worsening hypoxemic symptoms -Supplemental oxygen requirement is decreasing -wean to room air as able and was on 2 Liters this AM  -Supplemental oxygen via nasal cannula and wean O2 as tolerated -Continuous pulse oximetry and maintain O2 saturation greater than 90% -Chest x-ray on 04/17/2018 showed some interval worsening of bilateral edema and infiltrates and there is also some bilateral pleural effusions with bibasilar atelectasis or consolidation -CXR this AM showed LEFT lower lobe atelectasis versus consolidation, minimally improved. Increased interstitial infiltrates in the RIGHT lung since previous exam. -CT Scan of Abdomen and Pelvis showed Small bilateral pleural effusions with bibasilar collapse/consolidative change. Small volume abdominal ascites -Will add DuoNeb q6h and continue Albuterol 2.5 mg Neb q2hprn; Have added Budesonide 0.5 mg po BID and Arfomoterol 15 mcg BID -Because of her Wheezing, Solumedrol 40 mg IV BID was started -Will Add Flutter Valve and Incentive Spirometry -Also added Tussionex for her Cough  -She is continuing to get Diuresis and received 20 mg po BID and will given an additional IV 20 mg this AM  -Repeat X-Ray showed LEFT lower lobe atelectasis versus consolidation, minimally improved.  Increased interstitial infiltrates in the RIGHT lung since previous exam.  Acute on Chronic Diastolic CHF  -Hx of recovered Systolic CHF Dec 8916 (EF 30-35% TTE) -EF 03/05/18 noted  EF 55-60% w/ mild LVH, normal RV fxn, and no valvular issues  -BNP on admission was 1,045 and worsened to 3,208.7; Will repeat in AM  -Cont to diurese with transition to a maintenance dose and is now on Furosemide 20 mg po BID -Strict I's/O's and Daily Weights -C/w Carvedilol 12.5 mg po BID, Hydralazine 100 mg po q8h, and Isosorbide Dintrate 20 mg po TID -Patient is -3 lbs since admission  and is +1,056 mL since Admission -Continue to Monitor Volume Status Carefully and watch for S/Sx of Volume Overload   CAD -S/p PCI Dec 2019 (prox LAD - 2 other lesions not amenable - not CABG candidate)  -On Brilinta initially due to ASA allergy, but then reportedly Brilinta was stopped due to "rash"  -Was on Plavix at time of d/c 03/27/18, but patient told Dr. 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 -BUN/Cr went from 36/2.13 on Admission and is now 46/1.67 today -Continue to Monitor and Trend Renal Fxn -Repeat CMP in AM   Uncontrolled HTN -BP was still poorly controlled and this AM was 166/78 -This may be a driving factor for her pulmonary edeam  -C/w Furosemide 20 mg po BID,  Carvedilol 12.5 mg po BID, Hydralazine 100 mg po q8h, and Isosorbide Dintrate 20 mg po TID -Has IV Hydralazine 5 mg q4hprn for SBP>180  Diarrhea, improved  -Likely related to use of abx earlier in hospital stay  -No indication of infectious etiology at this time but has a Hx of C Difficle Colitis in February 2019 -Started on Loperamide 2 mg po PRN Diarrhea and Loose Stools  -Currently Afebrile and Has no Leukocytosis -Continue to Monitor Carefully   Normocytic  anemia of unclear etiology  /Anemia of Chronic Kidney Disease Stage 4 -Typed and Screened and transfused 2 units of pRBC's -With ongoing diuresis her Hgb should be increasing -There was concern about occult bleeding  -Anemia panel not obtained this admit (will not do now as pt getting transfusion)  -CT abdom/pelvis to r/o RPH (on lovenox for DVT prophy) showed No evidence of intraabdominal/pelvic hemorrhage or explanation for decreased hemoglobin. -Hgb/Hct trended down to 6.9/21.3 and after 2 units is now 8.2/24.2 -> 8.4/27.3 -Continue to monitor for S/Sx of Bleeding -Repeat CBC in AM   Type 2 diabetes complicated by CKD -XIH0T 03/15/2018 was 6.4  -CBG controlled at this time and ranging from 99-122 -C/w sensitive NovoLog sliding scale before meals and at bedtime  Hypokalemia, improved and now is Hyperkalemic slightly  -Improved after Repletion.  K+ is now worsened from 4.2 -> 5.4 -Continue monitor replete as necessary -We will repeat CMP in the a.m.  Chronic Back Pain  -Continue Home medications MS Contin 15 mg p.o. twice daily, Dilaudid 0.5 mg every 4 PRN for breakthrough pain, oxycodone 5 to 10 mg p.o. every 4 PRN for severe pain as well as tramadol 50 mg p.o. every 6 hours as needed moderate pain  Hx of Breast CA  s/p Bilateral mastectomies + chemo -Will need outpatient follow up with PCP   Hypothyroidism -TSH was not done on admission and will check in AM  -C/w Levothyroxine 200 mcg po Daily   History of Seizures/Seizure Disorder and migraines -C/w Divalproex 1000 mg p.o. daily -Has not had a Seizure in 10 years   Hard of Hearing/Ear Congestion -Continue with Debrox as well as Ciprodex  Dyslipidemia -C/w with AtorvaStatin 80 mg po qHS  Insomnia -Continue with Melatonin 3 mg po qHS  Generalized Anxiety Disorder -Continue with BuSpirone 15 mg po BID   GERD -C/w Pantoprazole 40 mg po BID  Hyponatremia -Patient's Na+ went from 138 -> 134 -Continue to Monitor and  repeat CMP in AM   DVT prophylaxis: SCDs given drop in Hb/Hct Code Status: FULL CODE  Family Communication: No family present at bedside  Disposition Plan: SNF at D/C when Respiratory Status and Wheezing is improved  Consultants:   Discussed the Case with ID Dr. Megan Salon   Procedures:  None  Antimicrobials:  Anti-infectives (From admission, onward)   Start     Dose/Rate Route Frequency Ordered Stop   04/12/18 0600  piperacillin-tazobactam (ZOSYN) IVPB 3.375 g     3.375 g 12.5 mL/hr over 240 Minutes Intravenous Every 8 hours 04/12/18 0548 04/17/18 1251     Subjective: Seen and examined at bedside she is still hard of hearing and had some eardrops coronary recently.  No nausea or vomiting does not feel well and states that shortness of breath is about the same as yesterday and still only feels 50% better than coming in.  She was wheezing and her main complaint was her cough today and she states that she is been coughing up a lot of whitish sputum.  No chest pain.  No other concerns or complaints at this time.  Objective: Vitals:   04/20/18 0045 04/20/18 0211 04/20/18 0543 04/20/18 1141  BP: (!) 156/74  (!) 175/78 (!) 166/78  Pulse: 74  75 70  Resp: 20  20 20   Temp: (!) 97.5 F (36.4 C)  97.7 F (36.5 C) (!) 97.5 F (36.4 C)  TempSrc: Oral  Oral Oral  SpO2: 99% 98% 100% 97%  Weight:      Height:        Intake/Output Summary (Last 24 hours) at 04/20/2018 1249 Last data filed at 04/20/2018 1042 Gross per 24 hour  Intake 822 ml  Output 700 ml  Net 122 ml   Filed Weights   04/19/18 0500 04/19/18 0646 04/20/18 0024  Weight: 70 kg 70.1 kg 71.3 kg   Examination: Physical Exam:  Constitutional: Nourished, well-developed overweight Caucasian female currently no acute distress appears a little uncomfortable today and states that she is still coughing significantly and bringing up white sputum Eyes: Conjunctive are normal.  Sclera anicteric ENMT: Ears and nose appear normal.   Somewhat hard of hearing.  Mucous members are moist Neck: Appears supple no JVD Respiratory: Diminished to auscultation bilaterally with coarse breath sounds and severe wheezing today that is scattered throughout with crackles appreciated as well.  Has a normal respiratory effort and she is not tachypneic but does have a productive sounding cough and is wearing supplemental oxygen via nasal cannula 2 L Cardiovascular: The rate and rhythm.  No appreciable murmurs, rubs, gallops.  Has 1+ lower extremity edema noted Abdomen: Soft, nontender, distended slightly secondary body habitus.  Bowel sounds present GU: Deferred Musculoskeletal: No contractures or cyanosis.  No joint deformities upper lower extremities Skin: Skin  is warm and dry no appreciable rashes or lesions on physical evaluation Neurologic: Cranial Nerves II through XII gross intact no appreciable focal deficits.  Romberg sign cerebellar reflexes were not assessed Psychiatric: Normal judgment and insight.  Patient is awake, alert, oriented.  Slightly depressed appearing  Data Reviewed: I have personally reviewed following labs and imaging studies  CBC: Recent Labs  Lab 04/17/18 0202 04/18/18 0225 04/18/18 1516 04/19/18 0219 04/20/18 0641  WBC 5.0 4.0 4.5 4.4 5.3  NEUTROABS 2.3  --   --   --  2.5  HGB 8.0* 6.9* 8.1* 8.2* 8.4*  HCT 23.8* 21.3* 24.7* 24.2* 27.3*  MCV 89.5 89.9 88.2 88.6 89.8  PLT 210 183 183 195 419   Basic Metabolic Panel: Recent Labs  Lab 04/16/18 0347 04/17/18 0202 04/18/18 0225 04/19/18 0219 04/20/18 0641  NA 139 137 140 138 134*  K 3.4* 3.5 3.2* 4.2 5.4*  CL 103 98 101 100 99  CO2 28 28 30 29 28   GLUCOSE 69* 132* 146* 102* 113*  BUN 46* 45* 46* 44* 46*  CREATININE 1.95* 1.88* 1.81* 1.76* 1.67*  CALCIUM 8.0* 8.0* 7.9* 8.0* 8.1*  MG  --  1.8 1.8 2.3 2.1  PHOS  --  2.8  --   --  3.2   GFR: Estimated Creatinine Clearance: 31.6 mL/min (A) (by C-G formula based on SCr of 1.67 mg/dL (H)). Liver  Function Tests: Recent Labs  Lab 04/17/18 0202 04/18/18 0225 04/20/18 0641  AST  --  23 26  ALT  --  11 12  ALKPHOS  --  36* 45  BILITOT  --  0.5 0.4  PROT  --  4.8* 5.2*  ALBUMIN 1.8* 1.6* 2.0*   No results for input(s): LIPASE, AMYLASE in the last 168 hours. No results for input(s): AMMONIA in the last 168 hours. Coagulation Profile: No results for input(s): INR, PROTIME in the last 168 hours. Cardiac Enzymes: No results for input(s): CKTOTAL, CKMB, CKMBINDEX, TROPONINI in the last 168 hours. BNP (last 3 results) No results for input(s): PROBNP in the last 8760 hours. HbA1C: No results for input(s): HGBA1C in the last 72 hours. CBG: Recent Labs  Lab 04/19/18 1147 04/19/18 1629 04/19/18 2117 04/20/18 0547 04/20/18 1137  GLUCAP 128* 99 122* 106* 99   Lipid Profile: No results for input(s): CHOL, HDL, LDLCALC, TRIG, CHOLHDL, LDLDIRECT in the last 72 hours. Thyroid Function Tests: No results for input(s): TSH, T4TOTAL, FREET4, T3FREE, THYROIDAB in the last 72 hours. Anemia Panel: No results for input(s): VITAMINB12, FOLATE, FERRITIN, TIBC, IRON, RETICCTPCT in the last 72 hours. Sepsis Labs: Recent Labs  Lab 04/14/18 0619  PROCALCITON 1.43  LATICACIDVEN 0.5    Recent Results (from the past 240 hour(s))  Respiratory Panel by PCR     Status: Abnormal   Collection Time: 04/11/18  4:16 AM  Result Value Ref Range Status   Adenovirus NOT DETECTED NOT DETECTED Final   Coronavirus 229E NOT DETECTED NOT DETECTED Final    Comment: (NOTE) The Coronavirus on the Respiratory Panel, DOES NOT test for the novel  Coronavirus (2019 nCoV)    Coronavirus HKU1 NOT DETECTED NOT DETECTED Final   Coronavirus NL63 NOT DETECTED NOT DETECTED Final   Coronavirus OC43 NOT DETECTED NOT DETECTED Final   Metapneumovirus NOT DETECTED NOT DETECTED Final   Rhinovirus / Enterovirus NOT DETECTED NOT DETECTED Final   Influenza A NOT DETECTED NOT DETECTED Final   Influenza B NOT DETECTED NOT  DETECTED Final   Parainfluenza Virus  1 NOT DETECTED NOT DETECTED Final   Parainfluenza Virus 2 NOT DETECTED NOT DETECTED Final   Parainfluenza Virus 3 NOT DETECTED NOT DETECTED Final   Parainfluenza Virus 4 NOT DETECTED NOT DETECTED Final   Respiratory Syncytial Virus DETECTED (A) NOT DETECTED Final    Comment: CRITICAL RESULT CALLED TO, READ BACK BY AND VERIFIED WITH: RN K COBB [id number] MLM    Bordetella pertussis NOT DETECTED NOT DETECTED Final   Chlamydophila pneumoniae NOT DETECTED NOT DETECTED Final   Mycoplasma pneumoniae NOT DETECTED NOT DETECTED Final    Comment: Performed at [facility], [place] [address] [place], [place] [address]  Blood culture (routine x 2)     Status: None   Collection Time: 04/11/18  4:18 AM  Result Value Ref Range Status   Specimen Description BLOOD RIGHT HAND  Final   Special Requests   Final    BOTTLES DRAWN AEROBIC ONLY Blood Culture adequate volume   Culture   Final    NO GROWTH 5 DAYS Performed at [facility], [place] [address], [place], [place] [address]    Report Status 04/16/2018 FINAL  Final  Blood culture (routine x 2)     Status: None   Collection Time: 04/11/18  4:20 AM  Result Value Ref Range Status   Specimen Description BLOOD RIGHT ARM  Final   Special Requests   Final    BOTTLES DRAWN AEROBIC AND ANAEROBIC Blood Culture adequate volume   Culture   Final    NO GROWTH 5 DAYS Performed at [facility], [place] [address], [place], [place] [address]    Report Status 04/16/2018 FINAL  Final  Expectorated sputum assessment w rflx to resp cult     Status: None   Collection Time: 04/14/18  6:30 AM  Result Value Ref Range Status   Specimen Description SPUTUM  Final   Special Requests Normal  Final   Sputum evaluation   Final    THIS SPECIMEN IS ACCEPTABLE FOR SPUTUM CULTURE Performed at [facility], [place] [address], [place], [place] [address]    Report Status 04/14/2018 FINAL  Final  Culture, respiratory      Status: None   Collection Time: 04/14/18  6:30 AM  Result Value Ref Range Status   Specimen Description SPUTUM  Final   Special Requests Normal Reflexed from [id number]  Final   Gram Stain   Final    MODERATE WBC PRESENT,BOTH PMN AND MONONUCLEAR ABUNDANT YEAST WITH PSEUDOHYPHAE Performed at [facility], [place] [address], [place],  [address]    Culture   Final    FEW CANDIDA GLABRATA FEW METHICILLIN RESISTANT STAPHYLOCOCCUS AUREUS    Report Status 04/18/2018 FINAL  Final   Organism ID, Bacteria METHICILLIN RESISTANT STAPHYLOCOCCUS AUREUS  Final      Susceptibility   Methicillin resistant staphylococcus aureus - MIC*    CIPROFLOXACIN >=8 RESISTANT Resistant     ERYTHROMYCIN >=8 RESISTANT Resistant     GENTAMICIN <=0.5 SENSITIVE Sensitive     OXACILLIN >=4 RESISTANT Resistant     TETRACYCLINE <=1 SENSITIVE Sensitive     VANCOMYCIN 1 SENSITIVE Sensitive     TRIMETH/SULFA <=10 SENSITIVE Sensitive     CLINDAMYCIN <=0.25 SENSITIVE Sensitive     RIFAMPIN <=0.5 SENSITIVE Sensitive     Inducible Clindamycin NEGATIVE Sensitive     * FEW METHICILLIN RESISTANT STAPHYLOCOCCUS AUREUS     Estimated body mass index is 26.98 kg/m as calculated from the  following:   Height as of this encounter: 5\' 4"  (1.626 m).   Weight as of this encounter: 71.3 kg.  Malnutrition Type:  Nutrition Problem: Inadequate oral intake Etiology: decreased appetite   Malnutrition Characteristics:  Signs/Symptoms: per patient/family report, meal completion < 50%   Nutrition Interventions:  Interventions: Glucerna shake   Radiology Studies: Dg Chest 2 View  Result Date: 04/19/2018 CLINICAL DATA:  Respiratory distress.  Shortness of breath. EXAM: CHEST - 2 VIEW COMPARISON:  April 17, 2018 FINDINGS: Cardiomegaly. Bilateral pleural effusions with underlying opacities. No other acute abnormalities. IMPRESSION: 1. Pleural effusions. Underlying opacities could be atelectasis or consolidation/infiltrate such  as pneumonia. Electronically Signed   By: Dorise Bullion III M.D   On: 04/19/2018 17:57   Dg Chest Port 1 View  Result Date: 04/20/2018 CLINICAL DATA:  Shortness of breath, history diabetes mellitus, hypertension, CHF, breast cancer EXAM: PORTABLE CHEST 1 VIEW COMPARISON:  Portable exam 0703 hours compared to 04/19/2018 FINDINGS: Mildly lordotic positioning. Borderline enlargement of cardiac silhouette. Mediastinal contours normal. Progressive interstitial infiltrates in RIGHT lung versus earlier study. Atelectasis versus consolidation LEFT lower lobe, minimally improved. No definite pleural effusion or pneumothorax. Bones demineralized. IMPRESSION: LEFT lower lobe atelectasis versus consolidation, minimally improved. Increased interstitial infiltrates in the RIGHT lung since previous exam. Electronically Signed   By: Lavonia Dana M.D.   On: 04/20/2018 08:15   Scheduled Meds: . arformoterol  15 mcg Nebulization BID  . atorvastatin  80 mg Oral QHS  . budesonide (PULMICORT) nebulizer solution  0.5 mg Nebulization BID  . busPIRone  15 mg Oral BID  . carbamide peroxide  5 drop Right EAR BID  . carvedilol  12.5 mg Oral BID WC  . ciprofloxacin-dexamethasone  4 drop Right EAR BID  . divalproex  1,000 mg Oral Daily  . feeding supplement (ENSURE ENLIVE)  237 mL Oral BID BM  . furosemide  20 mg Oral BID  . guaiFENesin  1,200 mg Oral BID  . hydrALAZINE  100 mg Oral Q8H  . insulin aspart  0-5 Units Subcutaneous QHS  . insulin aspart  0-9 Units Subcutaneous TID WC  . ipratropium-albuterol  3 mL Nebulization Q6H  . isosorbide dinitrate  20 mg Oral TID  . levothyroxine  200 mcg Oral Daily  . Melatonin  3 mg Oral QHS  . methylPREDNISolone (SOLU-MEDROL) injection  40 mg Intravenous Q12H  . morphine  15 mg Oral BID  . pantoprazole  40 mg Oral Daily  . sodium chloride flush  3 mL Intravenous Q12H   Continuous Infusions:   LOS: 9 days   Kerney Elbe, DO [facility] PAGER is on AMION   If 7PM-7AM, please contact night-coverage [email or web address] Password [facility] 04/20/2018, 12:49 PM

## 2018-04-20 NOTE — Progress Notes (Signed)
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

## 2018-04-20 NOTE — Progress Notes (Signed)
Physical Therapy Treatment Patient Details Name: Andrea [name] MRN: [id number] DOB: 1950-09-30 Today's Date: 04/20/2018    History of Present Illness 2yow from [facility] PMHdiastolic CHF, 3VCAD, ischemiccardiomyopathypresented via EMS with resp distress, hypoxia tachypnea, rales, "fluid on her". Treated with BiPAP and now weaned to [facility].    PT Comments    Patient received sitting at EOB with nursing staff present, willing to participate in PT today. Mobility and tolerance to activity very much improved today- patient able to perform transfers and gait approximately 124f with RW and min guard, VSS during gait on 2LPM but patient easily fatigued overall. She was left sitting at EOB with RT present and attending, all needs otherwise met. She continues to progress well however continue to recommend SNF/24 hour S moving forward.     Follow Up Recommendations  SNF;Supervision/Assistance - 24 hour     Equipment Recommendations  Other (comment)(TBD )    Recommendations for Other Services       Precautions / Restrictions Precautions Precautions: Fall Precaution Comments: Watch SpO2 and BP  Restrictions Weight Bearing Restrictions: No    Mobility  Bed Mobility               General bed mobility comments: sitting at EOB with RN   Transfers Overall transfer level: Needs assistance Equipment used: Rolling walker (2 wheeled) Transfers: Sit to/from Stand Sit to Stand: Min guard         General transfer comment: min guard, cues for hand placement but otherwise no physical assist   Ambulation/Gait Ambulation/Gait assistance: Min guard Gait Distance (Feet): 150 Feet Assistive device: Rolling walker (2 wheeled) Gait Pattern/deviations: Step-through pattern;Decreased stride length Gait velocity: Decreased    General Gait Details: slow but steady gait pattern with RW, VSS during gait but patient fatigued at end of gait distance    Stairs              Wheelchair Mobility    Modified Rankin (Stroke Patients Only)       Balance Overall balance assessment: Needs assistance Sitting-balance support: No upper extremity supported;Feet supported Sitting balance-Leahy Scale: Good     Standing balance support: Bilateral upper extremity supported;During functional activity Standing balance-Leahy Scale: Fair Standing balance comment: Reliant on BUE support                             Cognition Arousal/Alertness: Awake/alert Behavior During Therapy: Flat affect Overall Cognitive Status: No family/caregiver present to determine baseline cognitive functioning Area of Impairment: Safety/judgement;Orientation;Awareness                   Current Attention Level: Selective Memory: Decreased short-term memory Following Commands: Follows one step commands with increased time;Follows one step commands consistently Safety/Judgement: Decreased awareness of safety;Decreased awareness of deficits Awareness: Emergent Problem Solving: Decreased initiation;Slow processing;Requires verbal cues General Comments: Slow processing noted      Exercises      General Comments        Pertinent Vitals/Pain Pain Assessment: No/denies pain Pain Score: 0-No pain Pain Intervention(s): Monitored during session    Home Living                      Prior Function            PT Goals (current goals can now be found in the care plan section) Acute Rehab PT Goals Patient Stated Goal: to feel better PT Goal Formulation: With patient  Time For Goal Achievement: 04/26/18 Potential to Achieve Goals: Fair Progress towards PT goals: Progressing toward goals    Frequency    Min 2X/week      PT Plan Current plan remains appropriate    Co-evaluation              AM-PAC PT "6 Clicks" Mobility   Outcome Measure  Help needed turning from your back to your side while in a flat bed without using bedrails?: A  Little Help needed moving from lying on your back to sitting on the side of a flat bed without using bedrails?: A Little Help needed moving to and from a bed to a chair (including a wheelchair)?: A Little Help needed standing up from a chair using your arms (e.g., wheelchair or bedside chair)?: A Little Help needed to walk in hospital room?: A Little Help needed climbing 3-5 steps with a railing? : A Lot 6 Click Score: 17    End of Session Equipment Utilized During Treatment: Oxygen Activity Tolerance: Patient limited by fatigue;Patient tolerated treatment well Patient left: in bed;with call bell/phone within reach;Other (comment)(RT present and attending )   PT Visit Diagnosis: Muscle weakness (generalized) (M62.81);Unsteadiness on feet (R26.81)     Time: 7225-7505 PT Time Calculation (min) (ACUTE ONLY): 17 min  Charges:  $Gait Training: 8-22 mins                     Deniece Ree PT, DPT, CBIS  Supplemental Physical Therapist [place]    Pager [phone] Acute Rehab Office [phone]

## 2018-04-21 ENCOUNTER — Inpatient Hospital Stay (HOSPITAL_COMMUNITY): Payer: Medicare Other

## 2018-04-21 DIAGNOSIS — I5033 Acute on chronic diastolic (congestive) heart failure: Secondary | ICD-10-CM

## 2018-04-21 DIAGNOSIS — J9601 Acute respiratory failure with hypoxia: Secondary | ICD-10-CM

## 2018-04-21 DIAGNOSIS — R072 Precordial pain: Secondary | ICD-10-CM

## 2018-04-21 LAB — BASIC METABOLIC PANEL
Anion gap: 13 (ref 5–15)
BUN: 49 mg/dL — ABNORMAL HIGH (ref 8–23)
CO2: 27 mmol/L (ref 22–32)
Calcium: 9 mg/dL (ref 8.9–10.3)
Chloride: 94 mmol/L — ABNORMAL LOW (ref 98–111)
Creatinine, Ser: 1.96 mg/dL — ABNORMAL HIGH (ref 0.44–1.00)
GFR calc Af Amer: 30 mL/min — ABNORMAL LOW (ref >60)
GFR calc non Af Amer: 26 mL/min — ABNORMAL LOW (ref >60)
Glucose, Bld: 135 mg/dL — ABNORMAL HIGH (ref 70–99)
Potassium: 6.5 mmol/L (ref 3.5–5.1)
Sodium: 134 mmol/L — ABNORMAL LOW (ref 135–145)

## 2018-04-21 LAB — COMPREHENSIVE METABOLIC PANEL
ALT: 15 U/L (ref 0–44)
AST: 25 U/L (ref 15–41)
Albumin: 2.2 g/dL — ABNORMAL LOW (ref 3.5–5.0)
Alkaline Phosphatase: 49 U/L (ref 38–126)
Anion gap: 8 (ref 5–15)
BUN: 49 mg/dL — ABNORMAL HIGH (ref 8–23)
CO2: 28 mmol/L (ref 22–32)
Calcium: 8.6 mg/dL — ABNORMAL LOW (ref 8.9–10.3)
Chloride: 96 mmol/L — ABNORMAL LOW (ref 98–111)
Creatinine, Ser: 1.92 mg/dL — ABNORMAL HIGH (ref 0.44–1.00)
GFR calc non Af Amer: 26 mL/min — ABNORMAL LOW (ref >60)
GFR, EST AFRICAN AMERICAN: 31 mL/min — AB (ref >60)
Glucose, Bld: 176 mg/dL — ABNORMAL HIGH (ref 70–99)
Potassium: 6.4 mmol/L (ref 3.5–5.1)
Sodium: 132 mmol/L — ABNORMAL LOW (ref 135–145)
TOTAL PROTEIN: 6.2 g/dL — AB (ref 6.5–8.1)
Total Bilirubin: 0.7 mg/dL (ref 0.3–1.2)

## 2018-04-21 LAB — CBC WITH DIFFERENTIAL/PLATELET
Abs Immature Granulocytes: 0.03 10*3/uL (ref 0.00–0.07)
Basophils Absolute: 0 10*3/uL (ref 0.0–0.1)
Basophils Relative: 0 %
EOS ABS: 0 10*3/uL (ref 0.0–0.5)
EOS PCT: 0 %
HCT: 28.5 % — ABNORMAL LOW (ref 36.0–46.0)
Hemoglobin: 9.1 g/dL — ABNORMAL LOW (ref 12.0–15.0)
Immature Granulocytes: 1 %
Lymphocytes Relative: 16 %
Lymphs Abs: 0.7 10*3/uL (ref 0.7–4.0)
MCH: 28.7 pg (ref 26.0–34.0)
MCHC: 31.9 g/dL (ref 30.0–36.0)
MCV: 89.9 fL (ref 80.0–100.0)
MONO ABS: 0.2 10*3/uL (ref 0.1–1.0)
Monocytes Relative: 5 %
Neutro Abs: 3.3 10*3/uL (ref 1.7–7.7)
Neutrophils Relative %: 78 %
Platelets: 304 10*3/uL (ref 150–400)
RBC: 3.17 MIL/uL — ABNORMAL LOW (ref 3.87–5.11)
RDW: 14.1 % (ref 11.5–15.5)
WBC: 4.2 10*3/uL (ref 4.0–10.5)
nRBC: 0 % (ref 0.0–0.2)

## 2018-04-21 LAB — NA AND K (SODIUM & POTASSIUM), RAND UR
Potassium Urine: 38 mmol/L
Sodium, Ur: <10 mmol/L

## 2018-04-21 LAB — GLUCOSE, CAPILLARY
GLUCOSE-CAPILLARY: 181 mg/dL — AB (ref 70–99)
GLUCOSE-CAPILLARY: 144 mg/dL — AB (ref 70–99)
Glucose-Capillary: 177 mg/dL — ABNORMAL HIGH (ref 70–99)
Glucose-Capillary: 109 mg/dL — ABNORMAL HIGH (ref 70–99)

## 2018-04-21 LAB — OSMOLALITY, URINE: Osmolality, Ur: 315 mOsm/kg (ref 300–900)

## 2018-04-21 LAB — POTASSIUM
Potassium: 6.6 mmol/L (ref 3.5–5.1)
Potassium: 6.7 mmol/L (ref 3.5–5.1)
Potassium: 5.8 mmol/L — ABNORMAL HIGH (ref 3.5–5.1)

## 2018-04-21 LAB — MAGNESIUM: Magnesium: 2.1 mg/dL (ref 1.7–2.4)

## 2018-04-21 LAB — PHOSPHORUS: Phosphorus: 3.5 mg/dL (ref 2.5–4.6)

## 2018-04-21 MED ORDER — FUROSEMIDE 10 MG/ML IJ SOLN
40.0000 mg | Freq: Two times a day (BID) | INTRAMUSCULAR | Status: DC
  Administered 2018-04-21 – 2018-04-22 (×3): 40 mg via INTRAVENOUS
  Filled 2018-04-21 (×3): qty 4

## 2018-04-21 MED ORDER — SODIUM CHLORIDE 0.9 % IV BOLUS: 500.0000 mL | Freq: Once | INTRAVENOUS | Status: DC

## 2018-04-21 MED ORDER — INSULIN ASPART 100 UNIT/ML IV SOLN
5.0000 [IU] | Freq: Once | INTRAVENOUS | Status: AC
  Administered 2018-04-21: 5 [IU] via INTRAVENOUS

## 2018-04-21 MED ORDER — ALBUTEROL SULFATE (2.5 MG/3ML) 0.083% IN NEBU
10.0000 mg | INHALATION_SOLUTION | Freq: Once | RESPIRATORY_TRACT | Status: AC
  Administered 2018-04-21: 10 mg via RESPIRATORY_TRACT
  Filled 2018-04-21: qty 12

## 2018-04-21 MED ORDER — SODIUM POLYSTYRENE SULFONATE 15 GM/60ML PO SUSP
30.0000 g | Freq: Once | ORAL | Status: AC
  Administered 2018-04-21: 30 g via ORAL
  Filled 2018-04-21: qty 120

## 2018-04-21 MED ORDER — PRO-STAT SUGAR FREE PO LIQD: 30.0000 mL | Freq: Two times a day (BID) | ORAL | Status: DC

## 2018-04-21 MED ORDER — CALCIUM GLUCONATE-NACL 1-0.675 GM/50ML-% IV SOLN
1.0000 g | Freq: Once | INTRAVENOUS | Status: AC
  Administered 2018-04-21: 1000 mg via INTRAVENOUS
  Filled 2018-04-21: qty 50

## 2018-04-21 MED ORDER — TICAGRELOR 90 MG PO TABS
90.0000 mg | ORAL_TABLET | Freq: Two times a day (BID) | ORAL | Status: DC
  Administered 2018-04-21 – 2018-04-24 (×7): 90 mg via ORAL
  Filled 2018-04-21 (×7): qty 1

## 2018-04-21 MED ORDER — ADULT MULTIVITAMIN W/MINERALS CH
1.0000 | ORAL_TABLET | Freq: Every day | ORAL | Status: DC
  Administered 2018-04-22 – 2018-04-26 (×5): 1 via ORAL
  Filled 2018-04-21 (×5): qty 1

## 2018-04-21 MED ORDER — BOOST / RESOURCE BREEZE PO LIQD CUSTOM: 1.0000 | Freq: Two times a day (BID) | ORAL | Status: DC

## 2018-04-21 MED ORDER — DEXTROSE 50 % IV SOLN
1.0000 | Freq: Once | INTRAVENOUS | Status: AC
  Administered 2018-04-21: 50 mL via INTRAVENOUS
  Filled 2018-04-21: qty 50

## 2018-04-21 NOTE — Progress Notes (Signed)
PT Cancellation Note  Patient Details Name: Miriana Gaertner MRN: [id number] DOB: 08/19/1950   Cancelled Treatment:    Reason Eval/Treat Not Completed: Medical issues which prohibited therapy Per K+ recheck, potassium is now 6.5. Holding PT for today unless there is urgent need for skilled PT services to see her today.    Deniece Ree PT, DPT, CBIS  Supplemental Physical Therapist [facility]    Pager [phone] Acute Rehab Office [phone]

## 2018-04-21 NOTE — TOC Progression Note (Addendum)
Transition of Care [facility]) - Progression Note    Patient Details  Name: Andrea [name] MRN: [id number] Date of Birth: 1950-09-14  Transition of Care [facility]) CM/SW [place], LCSW Phone Number: 04/21/2018, 10:32 AM  Clinical Narrative: [facility] is also unable to offer a bed. Admissions coordinator said issue with declines may be a combination of no secondary insurance to cover copay after 5 days and respiratory issues. Spoke with son [name]. He stated patient is unable to afford copays and he and his brother are unable to assist her with that payment. CSW asked about home health and he said that they had tried that before but she ended back up in the hospital. Son gave CSW permission to send referral out to other [facility] SNF's to see if anyone is able to offer.  3:56 pm: Received call from son [name]. Notified him that patient has bed offers from [facility] and [facility]. Both are aware of her being in copay days soon. Inquired about applying for Medicaid. Son stated patient asked about that this morning. Son will review CMS Medicare scores for these two facilities and call CSW with decision.  4:07 pm: Updated patient.  Expected Discharge Plan: Skilled Nursing Facility Barriers to Discharge: Insurance Authorization  Expected Discharge Plan and Services Expected Discharge Plan: [place] In-house Referral: NA Discharge Planning Services: NA Post Acute Care Choice: NA Living arrangements for the past 2 months: [place], [place]                 DME Arranged: N/A DME Agency: NA HH Arranged: NA HH Agency: NA   Social Determinants of Health (SDOH) Interventions    Readmission Risk Interventions Readmission Risk Prevention Plan 04/12/2018  Transportation Screening Complete  Medication Review Press photographer) Complete  PCP or Specialist appointment within 3-5 days of discharge Complete  HRI or [place] Complete  SW Recovery Care/Counseling Consult Complete  [place] Complete  Some recent data might be hidden

## 2018-04-21 NOTE — Progress Notes (Signed)
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 She was found to be RSV positive, and required noninvasive mechanical ventilation (BiPAP) and diuresis. She was also recently hospitalized from2/23 - 3/6 for CHF, AKI, and confusion from narcotics.    Was admitted on 04/11/2018 and respiratory status is slowly improving and she feels 50% better but has developed watery diarrhea which has improved as well.  She is also associated this with cramping and back pain.  She was given a dose of loperamide yesterday with improvement of her symptoms.  Respiratory status is still slow to improve so medications and breathing treatments have been adjusted and she was started on Steroids. Her LE swelling worsened and she is still +2.276 Liters so Cardiology was consulted for further evaluation and recommendations given Volume Overload and CHF. Hospitalization has also been complicated by Hyperkalemia.   Assessment & Plan:   Principal Problem:   Acute hypoxemic respiratory failure (HCC) Active Problems:   Chronic pain   Chest pain   Acute respiratory failure with hypoxia (HCC)   Anemia   Acute on chronic diastolic CHF (congestive heart failure) (HCC)   RSV (respiratory syncytial virus pneumonia)   DM type 2 (diabetes mellitus, type 2) (HCC)   CKD (chronic kidney disease), stage IV (HCC)  Acute Hypoxic Respiratory Failure - RSV pneumonia+ pulmonary edema in the setting of Acute on Chronic Diastolic CHF -Presented with hypoxia, tachycardia, tachypnea placed on BiPAP; Now weaned off of BiPAP  and is on 2 Liters  -Chest x-ray done on admission which revealed bilateral pulmonary infiltrates suggestive of multilobar pneumonia -Respiratory panel positive for RSV -Completed 5 days of IV Zosyn  -Blood cultures x2 04/11/2018 showed NGTD at 5 Dasy -Sputum Gram Stain showed Moderate WBC Present, Both PMN and Mononuclear with Abudant Yeast with Psuedohyphae and Cx showed Few Candida Glabrata and Few MRSA which was Resistant to Ciprofloxacin, Erythromycin, and Oxacillin; ? if she is colonized with MRSA;  -I discussed the case with Infectious Diseases Dr. Michel Bickers who feels that the Candida glabrata is insignificant and the MRSA is likely a colonization as she has been afebrile with no leukocytosis and no worsening hypoxemic symptoms -Supplemental oxygen requirement is decreasing -wean to room air as able and was on 2 Liters this AM  -Supplemental oxygen via nasal cannula and wean O2 as tolerated -Continuous pulse oximetry and maintain O2 saturation greater than 90% -Chest x-ray on 04/17/2018 showed some interval worsening of bilateral edema and infiltrates and there is also some bilateral pleural effusions with bibasilar atelectasis or consolidation -CXR this AM showed "Enlargement of cardiac silhouette with vascular congestion and BILATERAL interstitial infiltrates favoring pulmonary edema and CHF. Small LEFT pleural effusion" -CT Scan of Abdomen and Pelvis showed Small bilateral pleural effusions with bibasilar collapse/consolidative change. Small volume abdominal ascites -Added DuoNeb q6h and continue Albuterol 2.5 mg Neb q2hprn; Have added Budesonide 0.5 mg po BID and Arfomoterol 15 mcg BID -Because of her Wheezing, Solumedrol 40 mg IV BID was started -Will Add Flutter Valve and Incentive Spirometry -Also added Tussionex for her Cough  -She was  continuing to get Diuresis and received 20 mg po BID but will stop and start IV Lasix 40 mg BID -Will consult Cardiology for further evaluation and  recommendations;   Acute on Chronic Diastolic CHF  -Hx of recovered Systolic CHF Dec 9371 (EF 30-35% TTE) -EF 03/05/18 noted EF 55-60% w/ mild LVH, normal RV fxn, and no valvular issues  -BNP on admission was 1,045 and worsened to 3,208.7; Will repeat in AM  -Cont to diurese with transition to a maintenance dose and is now on Furosemide 20 mg po BID but will stop and Start IV Lasix -Strict I's/O's and Daily Weights and Fluid Restrict to 1500 mL -C/w Carvedilol 12.5 mg po BID, Hydralazine 100 mg po q8h, and Isosorbide Dintrate 20 mg po TID -Patient is the same weight as admission  and is +2.276 mL since Admission -Continue to Monitor Volume Status Carefully and watch for S/Sx of Volume Overload  -Cardiology Consulted for further evaluation and recommendations given worsening Heart Failure with Repeat CXR this AM and Leg Swelling   CAD -S/p PCI Dec 2019 (prox LAD - 2 other lesions not amenable - not CABG candidate)  -On Brilinta initially due to ASA allergy, but then reportedly Brilinta was stopped due to "rash"; audiology was consulted and they will try this medication again -Was on Plavix at time of d/c 03/27/18, but patient told Dr. 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 -BUN/Cr went from 36/2.13 on Admission and trended down to 46/1.67 and now starting to trend up again and is 49/1.96 -Continue to Monitor and Trend Renal Fxn -Repeat CMP in AM   Uncontrolled HTN -BP was still poorly controlled and this AM was 180/83 -This may be a driving factor for her pulmonary  edeam  -C/w Furosemide 20 mg po BID,  Carvedilol 12.5 mg po BID, Hydralazine 100 mg po q8h, and Isosorbide Dintrate 20 mg po TID -Has IV Hydralazine 5 mg q4hprn for SBP>180  Diarrhea, improved  -Likely related to use of abx earlier in hospital stay  -No indication of infectious etiology at this time but has a Hx of C Difficle Colitis in February 2019 -Started on Loperamide 2 mg po PRN Diarrhea and Loose Stools  -Currently Afebrile and Has no Leukocytosis -Continue to Monitor Carefully   Normocytic anemia of unclear etiology  /Anemia of Chronic Kidney Disease Stage 4 -Typed and Screened and transfused 2 units of pRBC's -With ongoing diuresis her Hgb should be increasing -There was concern about occult bleeding  -Anemia panel not obtained this admit (will not do now as pt getting transfusion)  -CT abdom/pelvis to r/o RPH (on lovenox for DVT prophy) showed No evidence of intraabdominal/pelvic hemorrhage or explanation for decreased hemoglobin. -Hgb/Hct trended down to 6.9/21.3 and after 2 units is now 8.2/24.2 -> 8.4/27.3 -> 9.1/28.5 -Continue to monitor for S/Sx of Bleeding -Repeat CBC in AM   Type 2 diabetes complicated by CKD -IRC7E 03/15/2018 was 6.4  -CBG controlled at this time and ranging from 99-122 -C/w sensitive NovoLog sliding scale before meals and at bedtime  Hyperkalemia -Initially was Hypokalemic and  Improved after Repletion.  K+ is now worsened from 4.2 -> 5.4 -> 6.6 -Given IV Lasix 40 mg BID today and a dose of 30 g of Sodium Polystyrene   -Continue Monitor and Replete as Necessary -We will repeat CMP in the a.m.  Chronic Back Pain  -Continue Home medications MS Contin 15 mg p.o. twice daily, Dilaudid 0.5 mg every 4 PRN for breakthrough pain, oxycodone 5 to 10 mg p.o. every 4 PRN for severe pain as well as tramadol 50 mg p.o. every 6 hours as needed moderate pain  Hx of Breast CA s/p Bilateral mastectomies + chemo -Will need outpatient follow up with PCP    Hypothyroidism -TSH was not done on admission and will check in AM  -C/w Levothyroxine 200 mcg po Daily   History of Seizures/Seizure Disorder and migraines -C/w Divalproex 1000 mg p.o. daily -Has not had a Seizure in 10 years   Hard of Hearing/Ear Congestion -Continue with Debrox as well as Ciprodex  Dyslipidemia -C/w with AtorvaStatin 80 mg po qHS  Insomnia -Continue with Melatonin 3 mg po qHS  Generalized Anxiety Disorder -Continue with BuSpirone 15 mg po BID   GERD -C/w Pantoprazole 40 mg po BID  Hyponatremia -Patient's Na+ went from 138 -> 134 -> 132 -> 134 -Continue to Monitor and repeat CMP in AM   DVT prophylaxis: SCDs given drop in Hb/Hct Code Status: FULL CODE  Family Communication: No family present at bedside  Disposition Plan: SNF at D/C when Respiratory Status and Wheezing is improved and Appropriately Diuresed   Consultants:   Discussed the Case with ID Dr. Megan Salon  Cardiology Dr. Stanford Breed   Procedures:  None  Antimicrobials:  Anti-infectives (From admission, onward)   Start     Dose/Rate Route Frequency Ordered Stop   04/12/18 0600  piperacillin-tazobactam (ZOSYN) IVPB 3.375 g     3.375 g 12.5 mL/hr over 240 Minutes Intravenous Every 8 hours 04/12/18 0548 04/17/18 1251     Subjective: Seen and examined at bedside eating was not improved and she had more leg swelling today and still coughing up whitish/yellowish sputum.  No nausea or vomiting but "scared that she not getting better".  No lightheadedness or dizziness.  No other concerns or complaints at this time but still feels short of breath.  Objective: Vitals:   04/21/18 0038 04/21/18 0342 04/21/18 1108 04/21/18 1216  BP: (!) 165/87 (!) 176/81 (!) 195/83 (!) 180/83  Pulse: 86 90 87 89  Resp: 18 18 20    Temp: (!) 97.5 F (36.4 C) 97.8 F (36.6 C) 97.6 F (36.4 C)   TempSrc: Oral Oral Oral   SpO2: 96% 95% 92%   Weight:  72.6 kg    Height:        Intake/Output Summary (Last 24  hours) at 04/21/2018 1303 Last data filed at 04/21/2018 0856 Gross per 24 hour  Intake 1520 ml  Output 300 ml  Net 1220 ml   Filed Weights   04/19/18 0646 04/20/18 0024 04/21/18 0342  Weight: 70.1 kg 71.3 kg 72.6 kg   Examination: Physical Exam:  Constitutional: Well-nourished, well-developed overweight Caucasian female currently no acute distress appears well depressed and very anxious and she is still coughing and complained of shortness of breath. Eyes: Lids and conjunctive are normal.  Sclera anicteric ENMT: External ears and nose appear normal.  She is little bit hard of hearing.  Mucous members are moist Neck: Supple no JVD Respiratory: Diminished To auscultation bilaterally with coarse breath sounds and some  wheezing but does have some diffuse crackles.  Had a normal respiratory effort and she is not tachypneic but is wearing supplemental oxygen via nasal cannula 2 L Cardiovascular: Regular rate and rhythm.  No appreciable murmurs, rubs, gallops.  Has 1-2+ lower extremity edema noted now Abdomen: Soft, nontender, distended slightly secondary body habitus.  Bowel sounds present GU: Deferred Musculoskeletal: No contractures or cyanosis. Skin: Skin is warm and dry no appreciable rashes or lesions on limited skin evaluation Neurologic: Cranial nerves II through XII grossly intact no appreciable focal deficits.  Romberg sign cerebellar reflexes were not assessed Psychiatric: Judgment and insight.  Patient is awake, alert and oriented.  She is depressed appearing and very anxious  Data Reviewed: I have personally reviewed following labs and imaging studies  CBC: Recent Labs  Lab 04/17/18 0202 04/18/18 0225 04/18/18 1516 04/19/18 0219 04/20/18 0641 04/21/18 0448  WBC 5.0 4.0 4.5 4.4 5.3 4.2  NEUTROABS 2.3  --   --   --  2.5 3.3  HGB 8.0* 6.9* 8.1* 8.2* 8.4* 9.1*  HCT 23.8* 21.3* 24.7* 24.2* 27.3* 28.5*  MCV 89.5 89.9 88.2 88.6 89.8 89.9  PLT 210 183 183 195 226 299   Basic  Metabolic Panel: Recent Labs  Lab 04/17/18 0202 04/18/18 0225 04/19/18 0219 04/20/18 0641 04/21/18 0626 04/21/18 0756 04/21/18 1106  NA 137 140 138 134* 132* 134*  --   K 3.5 3.2* 4.2 5.4* 6.4* 6.5* 6.6*  CL 98 101 100 99 96* 94*  --   CO2 28 30 29 28 28 27   --   GLUCOSE 132* 146* 102* 113* 176* 135*  --   BUN 45* 46* 44* 46* 49* 49*  --   CREATININE 1.88* 1.81* 1.76* 1.67* 1.92* 1.96*  --   CALCIUM 8.0* 7.9* 8.0* 8.1* 8.6* 9.0  --   MG 1.8 1.8 2.3 2.1 2.1  --   --   PHOS 2.8  --   --  3.2 3.5  --   --    GFR: Estimated Creatinine Clearance: 27.2 mL/min (A) (by C-G formula based on SCr of 1.96 mg/dL (H)). Liver Function Tests: Recent Labs  Lab 04/17/18 0202 04/18/18 0225 04/20/18 0641 04/21/18 0626  AST  --  23 26 25   ALT  --  11 12 15   ALKPHOS  --  36* 45 49  BILITOT  --  0.5 0.4 0.7  PROT  --  4.8* 5.2* 6.2*  ALBUMIN 1.8* 1.6* 2.0* 2.2*   No results for input(s): LIPASE, AMYLASE in the last 168 hours. No results for input(s): AMMONIA in the last 168 hours. Coagulation Profile: No results for input(s): INR, PROTIME in the last 168 hours. Cardiac Enzymes: No results for input(s): CKTOTAL, CKMB, CKMBINDEX, TROPONINI in the last 168 hours. BNP (last 3 results) No results for input(s): PROBNP in the last 8760 hours. HbA1C: No results for input(s): HGBA1C in the last 72 hours. CBG: Recent Labs  Lab 04/20/18 1137 04/20/18 1659 04/20/18 2104 04/21/18 0621 04/21/18 1107  GLUCAP 99 160* 189* 177* 109*   Lipid Profile: No results for input(s): CHOL, HDL, LDLCALC, TRIG, CHOLHDL, LDLDIRECT in the last 72 hours. Thyroid Function Tests: No results for input(s): TSH, T4TOTAL, FREET4, T3FREE, THYROIDAB in the last 72 hours. Anemia Panel: No results for input(s): VITAMINB12, FOLATE, FERRITIN, TIBC, IRON, RETICCTPCT in the last 72 hours. Sepsis Labs: No results for input(s): PROCALCITON, LATICACIDVEN in the last 168 hours.  Recent Results (from the past 240 hour(s))   Expectorated sputum  assessment w rflx to resp cult     Status: None   Collection Time: 04/14/18  6:30 AM  Result Value Ref Range Status   Specimen Description SPUTUM  Final   Special Requests Normal  Final   Sputum evaluation   Final    THIS SPECIMEN IS ACCEPTABLE FOR SPUTUM CULTURE Performed at [facility], [place] [address], [place], [place] [address]    Report Status 04/14/2018 FINAL  Final  Culture, respiratory     Status: None   Collection Time: 04/14/18  6:30 AM  Result Value Ref Range Status   Specimen Description SPUTUM  Final   Special Requests Normal Reflexed from [id number]  Final   Gram Stain   Final    MODERATE WBC PRESENT,BOTH PMN AND MONONUCLEAR ABUNDANT YEAST WITH PSEUDOHYPHAE Performed at [facility], [place] [address], [place], [place] [address]    Culture   Final    FEW CANDIDA GLABRATA FEW METHICILLIN RESISTANT STAPHYLOCOCCUS AUREUS    Report Status 04/18/2018 FINAL  Final   Organism ID, Bacteria METHICILLIN RESISTANT STAPHYLOCOCCUS AUREUS  Final      Susceptibility   Methicillin resistant staphylococcus aureus - MIC*    CIPROFLOXACIN >=8 RESISTANT Resistant     ERYTHROMYCIN >=8 RESISTANT Resistant     GENTAMICIN <=0.5 SENSITIVE Sensitive     OXACILLIN >=4 RESISTANT Resistant     TETRACYCLINE <=1 SENSITIVE Sensitive     VANCOMYCIN 1 SENSITIVE Sensitive     TRIMETH/SULFA <=10 SENSITIVE Sensitive     CLINDAMYCIN <=0.25 SENSITIVE Sensitive     RIFAMPIN <=0.5 SENSITIVE Sensitive     Inducible Clindamycin NEGATIVE Sensitive     * FEW METHICILLIN RESISTANT STAPHYLOCOCCUS AUREUS     Estimated body mass index is 27.46 kg/m as calculated from the following:   Height as of this encounter: 5\' 4"  (1.626 m).   Weight as of this encounter: 72.6 kg.  Malnutrition Type:  Nutrition Problem: Inadequate oral intake Etiology: decreased appetite   Malnutrition Characteristics:  Signs/Symptoms: per patient/family report, meal completion < 50%   Nutrition  Interventions:  Interventions: Glucerna shake   Radiology Studies: Dg Chest 2 View  Result Date: 04/19/2018 CLINICAL DATA:  Respiratory distress.  Shortness of breath. EXAM: CHEST - 2 VIEW COMPARISON:  April 17, 2018 FINDINGS: Cardiomegaly. Bilateral pleural effusions with underlying opacities. No other acute abnormalities. IMPRESSION: 1. Pleural effusions. Underlying opacities could be atelectasis or consolidation/infiltrate such as pneumonia. Electronically Signed   By: Dorise Bullion III M.D   On: 04/19/2018 17:57   Dg Chest Port 1 View  Result Date: 04/21/2018 CLINICAL DATA:  Shortness of breath, history breast cancer, CHF, diabetes mellitus, hypertension EXAM: PORTABLE CHEST 1 VIEW COMPARISON:  Portable exam 0718 hours compared to 04/20/2018 FINDINGS: Mild enlargement of cardiac silhouette with pulmonary vascular congestion. Mediastinal contours normal Interstitial infiltrates bilaterally, slightly increased on LEFT, favor pulmonary edema and CHF. Small LEFT pleural effusion. No pneumothorax. Bones demineralized. IMPRESSION: Enlargement of cardiac silhouette with vascular congestion and BILATERAL interstitial infiltrates favoring pulmonary edema and CHF. Small LEFT pleural effusion. Electronically Signed   By: Lavonia Dana M.D.   On: 04/21/2018 08:14   Dg Chest Port 1 View  Result Date: 04/20/2018 CLINICAL DATA:  Shortness of breath, history diabetes mellitus, hypertension, CHF, breast cancer EXAM: PORTABLE CHEST 1 VIEW COMPARISON:  Portable exam 0703 hours compared to 04/19/2018 FINDINGS: Mildly lordotic positioning. Borderline enlargement of cardiac silhouette. Mediastinal contours normal. Progressive interstitial infiltrates in RIGHT lung versus earlier study.  Atelectasis versus consolidation LEFT lower lobe, minimally improved. No definite pleural effusion or pneumothorax. Bones demineralized. IMPRESSION: LEFT lower lobe atelectasis versus consolidation, minimally improved. Increased  interstitial infiltrates in the RIGHT lung since previous exam. Electronically Signed   By: Lavonia Dana M.D.   On: 04/20/2018 08:15   Scheduled Meds: . arformoterol  15 mcg Nebulization BID  . atorvastatin  80 mg Oral QHS  . budesonide (PULMICORT) nebulizer solution  0.5 mg Nebulization BID  . busPIRone  15 mg Oral BID  . carbamide peroxide  5 drop Right EAR BID  . carvedilol  12.5 mg Oral BID WC  . divalproex  1,000 mg Oral Daily  . feeding supplement  1 Container Oral BID BM  . feeding supplement (PRO-STAT SUGAR FREE 64)  30 mL Oral BID WC  . furosemide  40 mg Intravenous BID  . guaiFENesin  1,200 mg Oral BID  . hydrALAZINE  100 mg Oral Q8H  . insulin aspart  0-5 Units Subcutaneous QHS  . insulin aspart  0-9 Units Subcutaneous TID WC  . isosorbide dinitrate  20 mg Oral TID  . levothyroxine  200 mcg Oral Daily  . Melatonin  3 mg Oral QHS  . methylPREDNISolone (SOLU-MEDROL) injection  40 mg Intravenous Q12H  . morphine  15 mg Oral BID  . multivitamin with minerals  1 tablet Oral Daily  . pantoprazole  40 mg Oral Daily  . sodium chloride flush  3 mL Intravenous Q12H  . ticagrelor  90 mg Oral BID   Continuous Infusions:   LOS: 10 days   Kerney Elbe, DO [facility] PAGER is on AMION  If 7PM-7AM, please contact night-coverage [email or web address] Password [email or web address] 04/21/2018, 1:03 PM

## 2018-04-21 NOTE — Progress Notes (Addendum)
Assumed care of pt, pt only took 1 of  kaxylate per nurse Tai, 2nd bottle given, call from lab with new k 6.6, paged Dr Alfredia Ferguson, educated on need to take meds as ordered  And that kaxylate is to help pull k from body and still elevated " I just dont want to poop all day" I educated it does excrete k in stool but does not mean she will poop all day and often a person might have one good bm" lasix iv as ordered from this am, pt has increased edema in legs, refuses SCD and  Has been walking in hallway

## 2018-04-21 NOTE — Progress Notes (Signed)
Iv inifltrated when giving dextrose iv,  Pt says she wanted iv team as she is so hard to stick, iv team consult placed, warm pack placed to right arm

## 2018-04-21 NOTE — Consult Note (Signed)
Cardiology Consultation:   Patient ID: Eliska Hamil MRN: [id number]; DOB: 07-29-1950  Admit date: 04/11/2018 Date of Consult: 04/21/2018  Primary Care Provider: Medicine, [facility] Primary Cardiologist: Kirk Ruths, MD   Patient Profile:   Avyn Aden is a 68 y.o. female with a hx of of CAD, ICM with improved LVEF, HTN, DM, breast cancer, CKD stage IV and seizure disorder who is being seen today for the evaluation of CHF at the request of [facility].   Recently diagnosed left ventricular systolic dysfunction EF 32-95% and multivessel CAD, status post 2 drug-eluting stents to the LAD artery on 12/30/2017, with residual stenoses in the diagonal artery and oblique marginal branch, not amenable to PCI. She was initially placed on Brilinta but later changed to Effient due to dyspnea. Now on Plavix. In addition, she developed AKI and her Lasix, Entresto and Aldactone were held. She was later restarted on Entresto but was readmitted 02/23/18 with worsening SOB and worsening renal function. CXR showed pulmonary edema. She was treated w/ IV Lasix and SCr worsened up to 2.5. diuretic decreased. Later developed worsening anemia, requiring blood transfusion.   Echo was repeated this admission and her systolic function has improved to 55 to 60%.  Again readmitted 2/23-3/6 for encephalopathy in setting of narcotic use for chronic back pain. Also diuresed.   History of Present Illness:   Ms. Krus presented 3/21 with worsening dyspnea, chest pressure and leg edema. Admitted for hypoxic respiratory failure in setting of pneumonia, + RSV and CHF exacerbation. Completed 5 day of broad spectrum Abx. Treated with BiPAP. Breathing improved but not completely resolved. Complicated by diarrhea which now has improved. Scr has been improved to 1.67 yesterday but worsen today to 1.96. Hyperkalemic at 6.5. Net I &O +2.2. breathing improving.   CXR yesterday showed  IMPRESSION: Enlargement of cardiac silhouette with vascular congestion and BILATERAL interstitial infiltrates favoring pulmonary edema and CHF. Small LEFT pleural effusion.  In regards to CAD, She was on Plavix when last evaluated by Dr. Oval Linsey 03/07/2018. Seems later it was discontinued due to rash.     Past Medical History:  Diagnosis Date   Breast cancer ([place])    bilat mastectomy and LUE lymphadenectomy 2014, chemoRx 2014 - 15   CHF (congestive heart failure) ([place])    Diabetes mellitus without complication ([place])    Esophageal reflux    Hypertension    Hypothyroid    Migraine    Seizure ([place])     Past Surgical History:  Procedure Laterality Date   ABDOMINAL HYSTERECTOMY     1984 , done for ruptured cyst and endometriosis   APPENDECTOMY     CHOLECYSTECTOMY     1980's   CORONARY STENT INTERVENTION N/A 12/30/2017   Procedure: CORONARY STENT INTERVENTION;  Surgeon: Martinique, Peter M, MD;  Location: [place] [facility];  Service: Cardiovascular;  Laterality: N/A;   EYE SURGERY  08/28/2015   Right eye   KNEE SURGERY Left    MASTECTOMY     bilateral   Open surgery for bowel obstruction, 2000's     RIGHT/LEFT HEART CATH AND CORONARY ANGIOGRAPHY N/A 12/29/2017   Procedure: RIGHT/LEFT HEART CATH AND CORONARY ANGIOGRAPHY;  Surgeon: Martinique, Peter M, MD;  Location: [place] [facility];  Service: Cardiovascular;  Laterality: N/A;     Inpatient Medications: Scheduled Meds:  arformoterol  15 mcg Nebulization BID   atorvastatin  80 mg Oral QHS   budesonide (PULMICORT) nebulizer solution  0.5 mg Nebulization BID   busPIRone  15 mg Oral BID   carbamide peroxide  5 drop Right EAR BID   carvedilol  12.5 mg Oral BID WC   divalproex  1,000 mg Oral Daily   feeding supplement  1 Container Oral BID BM   feeding supplement (PRO-STAT SUGAR FREE 64)  30 mL Oral BID WC   furosemide  40 mg Intravenous BID   guaiFENesin  1,200 mg Oral BID   hydrALAZINE  100 mg Oral  Q8H   insulin aspart  0-5 Units Subcutaneous QHS   insulin aspart  0-9 Units Subcutaneous TID WC   ipratropium-albuterol  3 mL Nebulization Q6H   isosorbide dinitrate  20 mg Oral TID   levothyroxine  200 mcg Oral Daily   Melatonin  3 mg Oral QHS   methylPREDNISolone (SOLU-MEDROL) injection  40 mg Intravenous Q12H   morphine  15 mg Oral BID   multivitamin with minerals  1 tablet Oral Daily   pantoprazole  40 mg Oral Daily   sodium chloride flush  3 mL Intravenous Q12H   Continuous Infusions:  PRN Meds: acetaminophen, albuterol, benzonatate, chlorpheniramine-HYDROcodone, hydrALAZINE, HYDROmorphone (DILAUDID) injection, loperamide, metoprolol tartrate, ondansetron **OR** ondansetron (ZOFRAN) IV, oxyCODONE, traMADol  Allergies:    Allergies  Allergen Reactions   Ticagrelor Rash   Aspartame And Phenylalanine Hives and Diarrhea   Aspirin Nausea And Vomiting   Maxzide [Hydrochlorothiazide W-Triamterene] Swelling    Fluid retention   Metformin And Related Diarrhea   Nsaids Nausea And Vomiting   Other Other (See Comments)    All steroids produce psychosis per pt Artificial sweeteners produce nausea and upset stomach.   Pravachol [Pravastatin] Other (See Comments)    unknown   Spironolactone    Stadol [Butorphanol] Other (See Comments)    Toradol, etc And related- hallucinations   Toradol [Ketorolac Tromethamine] Other (See Comments)    hallucinations   Vistaril [Hydroxyzine Hcl] Other (See Comments)    unknown   Erythromycin Itching and Rash   Morphine And Related Hives and Rash    Iv morphine.    Social History:   Social History   Socioeconomic History   Marital status: Divorced    Spouse name: Not on file   Number of children: Not on file   Years of education: Not on file   Highest education level: Not on file  Occupational History   Not on file  Social Needs   Financial resource strain: Not on file   Food insecurity:    Worry: Never  true    Inability: Never true   Transportation needs:    Medical: No    Non-medical: No  Tobacco Use   Smoking status: Never Smoker   Smokeless tobacco: Never Used  Substance and Sexual Activity   Alcohol use: No   Drug use: No   Sexual activity: Not Currently  Lifestyle   Physical activity:    Days per week: Patient refused    Minutes per session: Patient refused   Stress: Only a little  Relationships   Social connections:    Talks on phone: Patient refused    Gets together: Patient refused    Attends religious service: Patient refused    Active member of club or organization: Patient refused    Attends meetings of clubs or organizations: Patient refused    Relationship status: Patient refused   Intimate partner violence:    Fear of current or ex partner: Patient refused    Emotionally abused: Patient refused    Physically abused: Patient refused  Forced sexual activity: Patient refused  Other Topics Concern   Not on file  Social History Narrative   NONE    Family History:   Family History  Problem Relation Age of Onset   Sudden Cardiac Death Neg Hx      ROS:  Please see the history of present illness.  Patient with recent productive cough and general malaise. All other ROS reviewed and negative.     Physical Exam/Data:   Vitals:   04/20/18 1947 04/20/18 1953 04/21/18 0038 04/21/18 0342  BP:   (!) 165/87 (!) 176/81  Pulse:   86 90  Resp:   18 18  Temp:   (!) 97.5 F (36.4 C) 97.8 F (36.6 C)  TempSrc:   Oral Oral  SpO2: 98% 100% 96% 95%  Weight:    72.6 kg  Height:        Intake/Output Summary (Last 24 hours) at 04/21/2018 1049 Last data filed at 04/21/2018 0856 Gross per 24 hour  Intake 1520 ml  Output 300 ml  Net 1220 ml   Last 3 Weights 04/21/2018 04/20/2018 04/19/2018  Weight (lbs) 160 lb 157 lb 3.2 oz 154 lb 9.6 oz  Weight (kg) 72.576 kg 71.305 kg 70.126 kg     Body mass index is 27.46 kg/m.  General:  Well nourished,  chronically ill appearing in no acute distress HEENT: normal Neck: no JVD Endocrine:  No thryomegaly Vascular: No carotid bruits; FA pulses 2+ bilaterally without bruits  Cardiac:  normal S1, S2; RRR; no murmur  Lungs: Diffuse rhonchi Abd: soft, nontender, no hepatomegaly  Ext: 1+ edema Musculoskeletal:  No deformities, BUE and BLE strength normal and equal Skin: warm and dry  Neuro:  CNs 2-12 intact, no focal abnormalities noted Psych:  Normal affect   EKG:  The EKG 04/21/18 was personally reviewed and demonstrates: NSR at rate of 87 bmp   Relevant CV Studies:  Echo 03/05/2018 IMPRESSIONS    1. The left ventricle has has normal systolic function, with an ejection fraction of 55-60%. There is mildly increased left ventricular wall thickness.  2. The right ventricle has normal systolic function. The cavity was normal.  3. The mitral valve is normal in structure. There is mild thickening. There is mild mitral annular calcification present.  4. The tricuspid valve was normal in structure.  5. The aortic root is normal in size and structure.  6. Limited study for LV function; doppler not performed; normal LV systolic function.  CORONARY STENT INTERVENTION 12/30/17  Conclusion     Prox LAD lesion is 90% stenosed.  A drug-eluting stent was successfully placed using a STENT SYNERGY DES 3X38.  Post intervention, there is 0% residual stenosis.  Dist LAD lesion is 90% stenosed.  A drug-eluting stent was successfully placed using a STENT SYNERGY DES 2.5X16.  Post intervention, there is a 0% residual stenosis.   1. Successful PCI of the proximal and distal LAD with DES x 2.  Plan: since patient is ASA allergic I would recommend Brilinta monotherapy 90 mg bid for one year then 60 mg bid long term. Patient is a candidate for discharge from a cardiac standpoint tomorrow. Her residual CAD will be treated medically.       Laboratory Data:  Chemistry Recent Labs  Lab  04/20/18 0641 04/21/18 0626 04/21/18 0756  NA 134* 132* 134*  K 5.4* 6.4* 6.5*  CL 99 96* 94*  CO2 28 28 27   GLUCOSE 113* 176* 135*  BUN 46* 49* 49*  CREATININE 1.67* 1.92* 1.96*  CALCIUM 8.1* 8.6* 9.0  GFRNONAA 31* 26* 26*  GFRAA 36* 31* 30*  ANIONGAP 7 8 13     Recent Labs  Lab 04/18/18 0225 04/20/18 0641 04/21/18 0626  PROT 4.8* 5.2* 6.2*  ALBUMIN 1.6* 2.0* 2.2*  AST 23 26 25   ALT 11 12 15   ALKPHOS 36* 45 49  BILITOT 0.5 0.4 0.7   Hematology Recent Labs  Lab 04/19/18 0219 04/20/18 0641 04/21/18 0448  WBC 4.4 5.3 4.2  RBC 2.73* 3.04* 3.17*  HGB 8.2* 8.4* 9.1*  HCT 24.2* 27.3* 28.5*  MCV 88.6 89.8 89.9  MCH 30.0 27.6 28.7  MCHC 33.9 30.8 31.9  RDW 14.5 14.3 14.1  PLT 195 226 304    BNP Recent Labs  Lab 04/17/18 0202  BNP 3,208.7*     Radiology/Studies:  Ct Abdomen Pelvis Wo Contrast  Result Date: 04/18/2018 CLINICAL DATA:  Diffuse abdominal pain. Decreased hemoglobin. Rule out retroperitoneal hemorrhage. Prior hysterectomy. Appendectomy. Cholecystectomy. Bilateral mastectomy. EXAM: CT ABDOMEN AND PELVIS WITHOUT CONTRAST TECHNIQUE: Multidetector CT imaging of the abdomen and pelvis was performed following the standard protocol without IV contrast. COMPARISON:  Plain films 03/22/2018.  CT of 03/01/2018. FINDINGS: Lower chest: Left greater than right lower lobe airspace disease. Increased compared to 03/01/2018. Bilateral breast implants. Mild cardiomegaly with multivessel coronary artery atherosclerosis. Small bilateral pleural effusions. Hepatobiliary: Normal liver. Cholecystectomy, without biliary ductal dilatation. Pancreas: Normal, without mass or ductal dilatation. Spleen: Normal in size, without focal abnormality. Adrenals/Urinary Tract: Normal adrenal glands. Left kidney is pelvic in position. Has a similar atypical appearance with suggestion of 2 moieties. Example images 52 and 60 of series 3. No hydronephrosis. Interpolar right renal cyst. No bladder  calculi. Stomach/Bowel: Normal stomach, without wall thickening. Colonic stool burden suggests constipation. Normal small bowel. Vascular/Lymphatic: Aortic and branch vessel atherosclerosis. No abdominopelvic adenopathy. Reproductive: Hysterectomy.  No adnexal mass. Other: No free pelvic fluid. There is small volume perihepatic ascites. No free intraperitoneal air. No evidence of intraperitoneal or retroperitoneal hemorrhage. Musculoskeletal: Anasarca. Superior endplate compression deformity is mild at L3. Similar. IMPRESSION: 1. No evidence of intraabdominal/pelvic hemorrhage or explanation for decreased hemoglobin. 2. Small bilateral pleural effusions with bibasilar collapse/consolidative change. Small volume abdominal ascites. 3. Upper pelvic left kidney, without hydronephrosis. 4. Coronary artery atherosclerosis. Aortic Atherosclerosis (ICD10-I70.0). Electronically Signed   By: Abigail Miyamoto M.D.   On: 04/18/2018 11:07   Dg Chest 2 View  Result Date: 04/19/2018 CLINICAL DATA:  Respiratory distress.  Shortness of breath. EXAM: CHEST - 2 VIEW COMPARISON:  April 17, 2018 FINDINGS: Cardiomegaly. Bilateral pleural effusions with underlying opacities. No other acute abnormalities. IMPRESSION: 1. Pleural effusions. Underlying opacities could be atelectasis or consolidation/infiltrate such as pneumonia. Electronically Signed   By: Dorise Bullion III M.D   On: 04/19/2018 17:57   Dg Chest Port 1 View  Result Date: 04/21/2018 CLINICAL DATA:  Shortness of breath, history breast cancer, CHF, diabetes mellitus, hypertension EXAM: PORTABLE CHEST 1 VIEW COMPARISON:  Portable exam 0718 hours compared to 04/20/2018 FINDINGS: Mild enlargement of cardiac silhouette with pulmonary vascular congestion. Mediastinal contours normal Interstitial infiltrates bilaterally, slightly increased on LEFT, favor pulmonary edema and CHF. Small LEFT pleural effusion. No pneumothorax. Bones demineralized. IMPRESSION: Enlargement of cardiac  silhouette with vascular congestion and BILATERAL interstitial infiltrates favoring pulmonary edema and CHF. Small LEFT pleural effusion. Electronically Signed   By: Lavonia Dana M.D.   On: 04/21/2018 08:14   Dg Chest Port 1 View  Result Date: 04/20/2018 CLINICAL DATA:  Shortness of breath, history diabetes mellitus, hypertension, CHF, breast cancer EXAM: PORTABLE CHEST 1 VIEW COMPARISON:  Portable exam 0703 hours compared to 04/19/2018 FINDINGS: Mildly lordotic positioning. Borderline enlargement of cardiac silhouette. Mediastinal contours normal. Progressive interstitial infiltrates in RIGHT lung versus earlier study. Atelectasis versus consolidation LEFT lower lobe, minimally improved. No definite pleural effusion or pneumothorax. Bones demineralized. IMPRESSION: LEFT lower lobe atelectasis versus consolidation, minimally improved. Increased interstitial infiltrates in the RIGHT lung since previous exam. Electronically Signed   By: Lavonia Dana M.D.   On: 04/20/2018 08:15    Assessment and Plan:   1. Acute on chronic systolic CHF - Last echo in 02/2018 showed improved LVEF 55-60% - Diuresed since admit. Net I & O positive. Weight is same since admit.   2. CAD s/p PCI 12/2017 - cath in Dec 2019 showed multivessel CAD, status post 2 drug-eluting stents to the LAD artery on 12/30/2017, with residual stenoses in the diagonal artery and oblique marginal branch, not amenable to PCI - She is allergic to ASA. DC brilinta due to dyspnea. Effient DC 01/28/18 due to rash.  - She was on Plavix when last evaluated by Dr. Oval Linsey 03/07/2018. Seems later it was discontinued due to rash.  - Currently not on any antiplatelet therapy. MD to review   For questions or updates, please contact [place] Please consult [email or web address] for contact info under     Signed, Leanor Kail, PA  04/21/2018 10:49 AM   As above, pt seen and examined; briefly she is a 68 year old female with past medical history of  coronary artery disease status post PCI of LAD, ischemic cardiomyopathy improved on most recent echocardiogram, diabetes mellitus, hypertension, chronic stage IV kidney disease, seizure disorder for evaluation of chest pain and acute on chronic diastolic congestive heart failure.  Patient is status post PCI of LAD December 2019.  She apparently cannot take aspirin and had reactions to Plavix and Brilinta.  She is on no antiplatelet medications.  Admitted March 21 with dyspnea, chest pressure and lower extremity edema.  Treated for pneumonia and also noted to have RSV.  She has been improving but cardiology asked to evaluate.  She does describe dyspnea which is not unusual for her.  She has had almost continuous chest pain since December that increases with cough. Physical exam shows 1+ lower extremity edema. Chest x-ray shows CHF Sodium 134, potassium 6.5, BUN 49, creatinine 1.96.  Hemoglobin 9.1; Electrocardiogram shows sinus rhythm, LVH, nonspecific ST changes.  1 chest pain-symptoms increase with cough and electrocardiogram with no diagnostic ST changes.  No plans for further ischemia evaluation.  2 acute on chronic diastolic congestive heart failure-most recent echocardiogram showed improvement in LV function.  Remains mildly volume overloaded.  Continue Lasix at present dose.  Follow renal function and potassium closely.  Needs fluid restriction and low-sodium diet.  3 coronary artery disease-status post recent PCI in December.  She is on no antiplatelet agents as she states she cannot tolerate aspirin and had a rash to Plavix.  There was possible dyspnea with Brilinta.  I will try this medication again.  4 hyperkalemia-patient given Kayexalate this morning.  Follow-up laboratories pending.  Per primary care.  5 chronic stage IV kidney disease-follow renal function closely with diuresis.  6 hypertension-continue present blood pressure medications.  Will increase carvedilol if needed and  potentially add amlodipine.  7 pneumonia-COPD-pulmonary toilet per primary care.  Kirk Ruths, MD

## 2018-04-21 NOTE — Progress Notes (Signed)
Lab called with result of k 6.7, paged Dr Alfredia Ferguson

## 2018-04-21 NOTE — Progress Notes (Addendum)
Nutrition Follow-up  RD working remotely.  DOCUMENTATION CODES:   Not applicable  INTERVENTION:   -D/c Ensure Enlive po BID, each supplement provides 350 kcal and 20 grams of protein -Boost Breeze po BID, each supplement provides 250 kcal and 9 grams of protein -30 ml Prostat BID, each supplement provides 100 kcals and 15 grams protein -MVI with minerals daily  NUTRITION DIAGNOSIS:   Inadequate oral intake related to decreased appetite as evidenced by per patient/family report, meal completion < 50%.  Ongoing  GOAL:   Patient will meet greater than or equal to 90% of their needs  Progressing  MONITOR:   PO intake, Supplement acceptance, Labs, Skin, Weight trends, I & O's  REASON FOR ASSESSMENT:   Consult Assessment of nutrition requirement/status  ASSESSMENT:   68 yo with PMH diastolic CHF, 3VCAD, ischemic cardiomyopathy presented via EMS with resp distress, hypoxia tachypnea, rales, "fluid on her". Treated with BiPAP and then weaned to 3L [place].  Reviewed I/O's: +630 ml x 24 hours and +2 L since admission  UOP: 650 ml x 24 hours  RD re-consulted for assessment and concern that Ensure is causing loose stools.  Spoke with pt by phone, who reports she is feeling poorly today. She shares that she continues to have a decreased appetite and does not think this has improved over the past week. She estimates that she has been consuming about 50% of her meals on average (documented meal completion 25-100%, averaging around 50% of meals). Her biggest concern is that "I need to eat food that doesn't upset my stomach".   Pt confirms that she has difficulty tolerating Ensure supplements ("they hurt my stomach"). She also expressed concern over IVFs and fluid restriction. Encouraged pt to consume as much off her meal trays as possible and consume supplements to help meet her nutritional needs. Pt amenable to try Prostat and Boost Breeze, secondary oto fluid restriction and because  they are not milky consistency.   Per CSW notes, plan to d/c to SNF once medically stable.   Medications reviewed and include solu-medrol.   Labs reviewed: K: 6.5, CBGS: 177 (inpatient orders for glycemic control are 0-5 units insulin aspart q HS and 0-9 units insulin aspart TID with meals).   Diet Order:   Diet Order            Diet Heart Room service appropriate? Yes; Fluid consistency: Thin; Fluid restriction: 1500 mL Fluid  Diet effective now              EDUCATION NEEDS:   Not appropriate for education at this time  Skin:  Skin Assessment: Reviewed RN Assessment  Last BM:  04/20/18  Height:   Ht Readings from Last 1 Encounters:  04/19/18 5\' 4"  (1.626 m)    Weight:   Wt Readings from Last 1 Encounters:  04/21/18 72.6 kg    Ideal Body Weight:  54.5 kg  BMI:  Body mass index is 27.46 kg/m.  Estimated Nutritional Needs:   Kcal:  1700-1900  Protein:  85-100 gm  Fluid:  1.7-1.9 L    Skyelar Swigart A. Jimmye Norman, RD, LDN, [place] Registered Dietitian II Certified Diabetes Care and Education Specialist Pager: [phone] After hours Pager: [phone]

## 2018-04-21 NOTE — Progress Notes (Signed)
PT Cancellation Note  Patient Details Name: Andrea [name] MRN: [id number] DOB: 07/01/1950   Cancelled Treatment:    Reason Eval/Treat Not Completed: Other (comment) K+ 6.4 per most recent lab values available (out of [facility] PT guidelines, patient at risk for cardiac arrhythmias). Have communicated with attending MD via Epic secure messenger regarding PT concerns, currently awaiting MD clearance or improved lab values to see patient.    Deniece Ree PT, DPT, CBIS  Supplemental Physical Therapist [facility]    Pager [phone] Acute Rehab Office [phone]

## 2018-04-22 ENCOUNTER — Inpatient Hospital Stay (HOSPITAL_COMMUNITY): Payer: Medicare Other

## 2018-04-22 LAB — CBC WITH DIFFERENTIAL/PLATELET
Abs Immature Granulocytes: 0.05 10*3/uL (ref 0.00–0.07)
Basophils Absolute: 0 10*3/uL (ref 0.0–0.1)
Basophils Relative: 0 %
Eosinophils Absolute: 0 10*3/uL (ref 0.0–0.5)
Eosinophils Relative: 0 %
HCT: 29.6 % — ABNORMAL LOW (ref 36.0–46.0)
Hemoglobin: 9.9 g/dL — ABNORMAL LOW (ref 12.0–15.0)
Immature Granulocytes: 1 %
Lymphocytes Relative: 9 %
Lymphs Abs: 0.7 10*3/uL (ref 0.7–4.0)
MCH: 30.1 pg (ref 26.0–34.0)
MCHC: 33.4 g/dL (ref 30.0–36.0)
MCV: 90 fL (ref 80.0–100.0)
MONO ABS: 0.4 10*3/uL (ref 0.1–1.0)
Monocytes Relative: 4 %
Neutro Abs: 6.8 10*3/uL (ref 1.7–7.7)
Neutrophils Relative %: 86 %
Platelets: 426 10*3/uL — ABNORMAL HIGH (ref 150–400)
RBC: 3.29 MIL/uL — AB (ref 3.87–5.11)
RDW: 14 % (ref 11.5–15.5)
WBC: 7.9 10*3/uL (ref 4.0–10.5)
nRBC: 0 % (ref 0.0–0.2)

## 2018-04-22 LAB — COMPREHENSIVE METABOLIC PANEL
ALT: 14 U/L (ref 0–44)
AST: 31 U/L (ref 15–41)
Albumin: 2.2 g/dL — ABNORMAL LOW (ref 3.5–5.0)
Alkaline Phosphatase: 51 U/L (ref 38–126)
Anion gap: 10 (ref 5–15)
BUN: 50 mg/dL — ABNORMAL HIGH (ref 8–23)
CALCIUM: 8.6 mg/dL — AB (ref 8.9–10.3)
CO2: 27 mmol/L (ref 22–32)
Chloride: 95 mmol/L — ABNORMAL LOW (ref 98–111)
Creatinine, Ser: 1.9 mg/dL — ABNORMAL HIGH (ref 0.44–1.00)
GFR calc non Af Amer: 27 mL/min — ABNORMAL LOW (ref >60)
GFR, EST AFRICAN AMERICAN: 31 mL/min — AB (ref >60)
Glucose, Bld: 160 mg/dL — ABNORMAL HIGH (ref 70–99)
Potassium: 5.6 mmol/L — ABNORMAL HIGH (ref 3.5–5.1)
Sodium: 132 mmol/L — ABNORMAL LOW (ref 135–145)
Total Bilirubin: 0.7 mg/dL (ref 0.3–1.2)
Total Protein: 6.1 g/dL — ABNORMAL LOW (ref 6.5–8.1)

## 2018-04-22 LAB — GLUCOSE, CAPILLARY
Glucose-Capillary: 158 mg/dL — ABNORMAL HIGH (ref 70–99)
Glucose-Capillary: 129 mg/dL — ABNORMAL HIGH (ref 70–99)
Glucose-Capillary: 151 mg/dL — ABNORMAL HIGH (ref 70–99)
Glucose-Capillary: 134 mg/dL — ABNORMAL HIGH (ref 70–99)

## 2018-04-22 LAB — RENAL FUNCTION PANEL
Albumin: 2.6 g/dL — ABNORMAL LOW (ref 3.5–5.0)
Anion gap: 8 (ref 5–15)
BUN: 51 mg/dL — ABNORMAL HIGH (ref 8–23)
CO2: 31 mmol/L (ref 22–32)
Calcium: 8.5 mg/dL — ABNORMAL LOW (ref 8.9–10.3)
Chloride: 98 mmol/L (ref 98–111)
Creatinine, Ser: 1.95 mg/dL — ABNORMAL HIGH (ref 0.44–1.00)
GFR calc Af Amer: 30 mL/min — ABNORMAL LOW (ref >60)
GFR calc non Af Amer: 26 mL/min — ABNORMAL LOW (ref >60)
Glucose, Bld: 164 mg/dL — ABNORMAL HIGH (ref 70–99)
Phosphorus: 4 mg/dL (ref 2.5–4.6)
Potassium: 5.2 mmol/L — ABNORMAL HIGH (ref 3.5–5.1)
Sodium: 137 mmol/L (ref 135–145)

## 2018-04-22 LAB — BRAIN NATRIURETIC PEPTIDE: B Natriuretic Peptide: 2365.5 pg/mL — ABNORMAL HIGH (ref 0.0–100.0)

## 2018-04-22 LAB — POTASSIUM
POTASSIUM: 5.5 mmol/L — AB (ref 3.5–5.1)
Potassium: 5.6 mmol/L — ABNORMAL HIGH (ref 3.5–5.1)
Potassium: 5.4 mmol/L — ABNORMAL HIGH (ref 3.5–5.1)
Potassium: 5.3 mmol/L — ABNORMAL HIGH (ref 3.5–5.1)
Potassium: 5.2 mmol/L — ABNORMAL HIGH (ref 3.5–5.1)

## 2018-04-22 LAB — MAGNESIUM: Magnesium: 2 mg/dL (ref 1.7–2.4)

## 2018-04-22 LAB — PROCALCITONIN: Procalcitonin: <0.1 ng/mL

## 2018-04-22 LAB — PHOSPHORUS: Phosphorus: 4 mg/dL (ref 2.5–4.6)

## 2018-04-22 MED ORDER — CARVEDILOL 25 MG PO TABS
25.0000 mg | ORAL_TABLET | Freq: Two times a day (BID) | ORAL | Status: DC
  Administered 2018-04-22 – 2018-04-26 (×8): 25 mg via ORAL
  Filled 2018-04-22 (×7): qty 1
  Filled 2018-04-22: qty 2
  Filled 2018-04-22: qty 1

## 2018-04-22 MED ORDER — FUROSEMIDE 10 MG/ML IJ SOLN
80.0000 mg | Freq: Two times a day (BID) | INTRAMUSCULAR | Status: DC
  Administered 2018-04-22 – 2018-04-23 (×2): 80 mg via INTRAVENOUS
  Filled 2018-04-22: qty 8

## 2018-04-22 MED ORDER — ALBUMIN HUMAN 25 % IV SOLN
25.0000 g | Freq: Four times a day (QID) | INTRAVENOUS | Status: AC
  Administered 2018-04-22 – 2018-04-24 (×8): 25 g via INTRAVENOUS
  Filled 2018-04-22 (×8): qty 100

## 2018-04-22 MED ORDER — SODIUM POLYSTYRENE SULFONATE 15 GM/60ML PO SUSP
15.0000 g | Freq: Three times a day (TID) | ORAL | Status: AC
  Administered 2018-04-22 (×2): 15 g via ORAL
  Filled 2018-04-22 (×2): qty 60

## 2018-04-22 NOTE — TOC Progression Note (Signed)
Transition of Care [facility]) - Progression Note    Patient Details  Name: Andrea [name] MRN: [id number] Date of Birth: 01/29/1950  Transition of Care [facility]) CM/SW [place], LCSW Phone Number: 04/22/2018, 11:53 AM  Clinical Narrative: Called patient's son Andrea [name] and reviewed star ratings for [facility] and [facility]. He has chosen [facility]. Reviewed with patient and she is agreeable as well. Patient is willing to fill out a Medicaid application.  Expected Discharge Plan: Skilled Nursing Facility Barriers to Discharge: Insurance Authorization  Expected Discharge Plan and Services Expected Discharge Plan: [place] In-house Referral: NA Discharge Planning Services: NA Post Acute Care Choice: NA Living arrangements for the past 2 months: [place], [place]                 DME Arranged: N/A DME Agency: NA HH Arranged: NA HH Agency: NA   Social Determinants of Health (SDOH) Interventions    Readmission Risk Interventions Readmission Risk Prevention Plan 04/12/2018  Transportation Screening Complete  Medication Review Press photographer) Complete  PCP or Specialist appointment within 3-5 days of discharge Complete  HRI or [place] Complete  SW Recovery Care/Counseling Consult Complete  [place] Complete  Some recent data might be hidden

## 2018-04-22 NOTE — Progress Notes (Signed)
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 She was found to be RSV positive, and required noninvasive mechanical ventilation (BiPAP) and diuresis. She was also recently hospitalized from2/23 - 3/6 for CHF, AKI, and confusion from narcotics.    04/22/2018: Cardiac BNP remains elevated.  Hyperkalemia persists.  CT chest without contrast was suggestive of congestive heart failure, however, atypical pneumonia could not be excluded.  Procalcitonin is less than 0.1.  Patient is off antibiotics.  Albumin is 2.2.  Volume overload persists.  Patient has significant chronic kidney disease, with serum creatinine of 1.9.  Will increase the dose of IV Lasix to 80 mg twice daily.  Will optimize volume.  Low threshold to administer albumin (though controversial, may enhance diuretic function).  Overall, patient seems to be improving, not on 2 L of supplemental oxygen.  Will manage hyperkalemia, will monitor renal function and electrolytes closely.  No fever or chills.  Assessment & Plan:   Principal Problem:   Acute hypoxemic respiratory failure (HCC) Active Problems:   Chronic pain   Chest pain   Acute respiratory failure with hypoxia (HCC)   Anemia   Acute on chronic diastolic CHF (congestive heart failure) (HCC)   RSV (respiratory syncytial virus pneumonia)   DM type 2 (diabetes mellitus, type 2) (HCC)   CKD (chronic kidney disease), stage IV (HCC)  Acute Hypoxic Respiratory Failure - RSV pneumonia+ pulmonary edema in the setting  of Acute on Chronic Diastolic CHF and chronic kidney disease stage III with probable mild AKI: -Presented with hypoxia, tachycardia, tachypnea placed on BiPAP; Now weaned off of BiPAP and is on 2 Liters  -Chest x-ray done on admission which revealed bilateral pulmonary infiltrates suggestive of multilobar pneumonia -Respiratory panel positive for RSV -Completed 5 days of IV Zosyn  -Blood cultures x2 04/11/2018 showed NGTD at 5 Dasy -Sputum Gram Stain showed Moderate WBC Present, Both PMN and Mononuclear with Abudant Yeast with Psuedohyphae and Cx showed Few Candida Glabrata and Few MRSA which was Resistant to Ciprofloxacin, Erythromycin, and Oxacillin; ? if she is colonized with MRSA;  -As per prior documentation, infectious disease team is of the impression that the Candida glabrata is insignificant and the MRSA is likely a colonization as she has been afebrile with no leukocytosis and no worsening hypoxemic symptoms -Patient is requiring only 2 L of supplemental oxygen via nasal cannula.   -Continuous pulse oximetry and maintain O2 saturation greater than 90% -CT scan of the chest done without contrast on 04/22/2018 is suggestive of likely congestive heart failure, however, atypical infection cannot be ruled out. -Procalcitonin is less than 0.1. -Increase IV diuretics (Lasix) to 80 mg p.o. twice daily. -Albumin is 2.2, have a low threshold to infuse albumin.  This may enhance diuretic function (controversial). -Continue nebulizer treatment. -Continue to manage expectantly.  Acute on Chronic Diastolic CHF  -Hx of recovered Systolic CHF Dec 3664 (EF 30-35% TTE) -EF 03/05/18 noted EF 55-60% w/ mild LVH, normal RV fxn, and no valvular issues  -BNP remains significantly elevated.   -Increase dose of diuretics.   -  Cardiology input is highly appreciated.   -Optimize volume.   -Strict I's and O's.    CAD -Cardiology input is highly appreciated.   -S/p PCI Dec 2019 (prox LAD - 2 other lesions not  amenable - not CABG candidate)  -Cardiology has advised trial of Brilinta.   -Was on Plavix at time of d/c 03/27/18, but patient told Dr. Thereasa Solo it also caused a rash and was therefore discontinued  -No chest pain.  Acute Kidney Injury on CKD Stage 3/4 -Baseline creatinine 1.86 with GFR of 28  -Serum creatinine is down to 1.9 today.   -Continue to monitor closely as the dose of diuretics will be increased.   -Strict I's and O's. -Manage hyperkalemia.  Uncontrolled HTN -Continue to optimize. -On Coreg 25 Mg p.o. twice daily, IV Lasix at a new dose of 80 mg twice daily, hydralazine 100 Mg p.o. every 8 hourly and isosorbide dinitrate 20 mg orally 3 times daily.  Diarrhea, improved  -No diarrhea stools reported.  Normocytic anemia of unclear etiology  /Anemia of Chronic Kidney Disease Stage 4 -Typed and Screened and transfused 2 units of pRBC's -Hemoglobin is stable today at 9.9 g/dL. -CT abdom/pelvis to r/o RPH (on lovenox for DVT prophy) showed No evidence of intraabdominal/pelvic hemorrhage or explanation for decreased hemoglobin. -Hgb/Hct trended down to 6.9/21.3  -Continue to monitor closely.  Type 2 diabetes complicated by CKD -AJG8T 03/15/2018 was 6.4  -Continue to optimize.  Hyperkalemia -Potassium this morning was 5.6. -Kayexalate 15 g p.o. every 8 hourly x 2 doses, and repeat renal panel afterwards. -IV Lasix.  Chronic Back Pain  -Continue to optimize. -Reviewed pain medications.  Hx of Breast CA s/p Bilateral mastectomies + chemo -Will need outpatient follow up with PCP   Hypothyroidism -Continue levothyroxine.  History of Seizures/Seizure Disorder and migraines -Continue Divalproex 1000 mg p.o. daily -Has not had a Seizure in 10 years   Hard of Hearing/Ear Congestion -Continue with Debrox -Complete course of Ciprodex otic.   Dyslipidemia -C/w with Atorvastatin 80 mg po qHS  Insomnia -Continue with Melatonin 3 mg po qHS  Generalized Anxiety  Disorder -Continue with BuSpirone 15 mg po BID   GERD -C/w Pantoprazole 40 mg po BID   DVT prophylaxis: SCDs  Code Status: FULL CODE  Family Communication: No family present at bedside  Disposition Plan: SNF   Consultants:   Discussed the Case with ID Dr. Megan Salon  Cardiology Dr. Stanford Breed   Procedures:  None  Antimicrobials:  Anti-infectives (From admission, onward)   Start     Dose/Rate Route Frequency Ordered Stop   04/12/18 0600  piperacillin-tazobactam (ZOSYN) IVPB 3.375 g     3.375 g 12.5 mL/hr over 240 Minutes Intravenous Every 8 hours 04/12/18 0548 04/17/18 1251     Subjective: Patient seen. No new complaints. Patient is slowly improving.  Objective: Vitals:   04/22/18 0600 04/22/18 0634 04/22/18 1009 04/22/18 1204  BP: (!) 169/80 (!) 160/70 (!) 171/86 (!) 153/78  Pulse:  96 95 84  Resp:    18  Temp:    (!) 97.4 F (36.3 C)  TempSrc:    Oral  SpO2:      Weight:      Height:        Intake/Output Summary (Last 24 hours) at 04/22/2018 1359 Last data filed at 04/22/2018 0800 Gross per 24 hour  Intake 620 ml  Output 500 ml  Net 120 ml   Filed Weights   04/20/18 0024 04/21/18 0342 04/22/18 0345  Weight:  71.3 kg 72.6 kg 72.2 kg   Examination: Physical Exam:  Constitutional: Not in any distress.  Patient is awake and alert. Eyes: Mild pallor. Neck: Supple no JVD Respiratory: Diminished air entry.  Mild wheezing.   Cardiovascular: S1-S2. Abdomen: Soft, nontender, distended slightly secondary body habitus.  Bowel sounds present GU: Deferred Extremities: 1+ to 2 bilateral leg edema  Neurologic: Awake and alert.  Patient moves all extremities.  Data Reviewed: I have personally reviewed following labs and imaging studies  CBC: Recent Labs  Lab 04/17/18 0202  04/18/18 1516 04/19/18 0219 04/20/18 0641 04/21/18 0448 04/22/18 0520  WBC 5.0   < > 4.5 4.4 5.3 4.2 7.9  NEUTROABS 2.3  --   --   --  2.5 3.3 6.8  HGB 8.0*   < > 8.1* 8.2* 8.4* 9.1*  9.9*  HCT 23.8*   < > 24.7* 24.2* 27.3* 28.5* 29.6*  MCV 89.5   < > 88.2 88.6 89.8 89.9 90.0  PLT 210   < > 183 195 226 304 426*   < > = values in this interval not displayed.   Basic Metabolic Panel: Recent Labs  Lab 04/17/18 0202 04/18/18 0225 04/19/18 9381 04/20/18 0175 04/21/18 1025 04/21/18 0756  04/21/18 2035 04/21/18 2351 04/22/18 0520 04/22/18 1000 04/22/18 1219  NA 137 140 138 134* 132* 134*  --   --   --  132*  --   --   K 3.5 3.2* 4.2 5.4* 6.4* 6.5*   < > 5.8* 5.6* 5.6* 5.4* 5.5*  CL 98 101 100 99 96* 94*  --   --   --  95*  --   --   CO2 28 30 29 28 28 27   --   --   --  27  --   --   GLUCOSE 132* 146* 102* 113* 176* 135*  --   --   --  160*  --   --   BUN 45* 46* 44* 46* 49* 49*  --   --   --  50*  --   --   CREATININE 1.88* 1.81* 1.76* 1.67* 1.92* 1.96*  --   --   --  1.90*  --   --   CALCIUM 8.0* 7.9* 8.0* 8.1* 8.6* 9.0  --   --   --  8.6*  --   --   MG 1.8 1.8 2.3 2.1 2.1  --   --   --   --  2.0  --   --   PHOS 2.8  --   --  3.2 3.5  --   --   --   --  4.0  --   --    < > = values in this interval not displayed.   GFR: Estimated Creatinine Clearance: 28 mL/min (A) (by C-G formula based on SCr of 1.9 mg/dL (H)). Liver Function Tests: Recent Labs  Lab 04/17/18 0202 04/18/18 0225 04/20/18 0641 04/21/18 0626 04/22/18 0520  AST  --  23 26 25 31   ALT  --  11 12 15 14   ALKPHOS  --  36* 45 49 51  BILITOT  --  0.5 0.4 0.7 0.7  PROT  --  4.8* 5.2* 6.2* 6.1*  ALBUMIN 1.8* 1.6* 2.0* 2.2* 2.2*   No results for input(s): LIPASE, AMYLASE in the last 168 hours. No results for input(s): AMMONIA in the last 168 hours. Coagulation Profile: No results for input(s): INR, PROTIME in the last 168 hours. Cardiac Enzymes:  No results for input(s): CKTOTAL, CKMB, CKMBINDEX, TROPONINI in the last 168 hours. BNP (last 3 results) No results for input(s): PROBNP in the last 8760 hours. HbA1C: No results for input(s): HGBA1C in the last 72 hours. CBG: Recent Labs  Lab  04/21/18 1107 04/21/18 1616 04/21/18 2121 04/22/18 0614 04/22/18 1200  GLUCAP 109* 181* 144* 158* 129*   Lipid Profile: No results for input(s): CHOL, HDL, LDLCALC, TRIG, CHOLHDL, LDLDIRECT in the last 72 hours. Thyroid Function Tests: No results for input(s): TSH, T4TOTAL, FREET4, T3FREE, THYROIDAB in the last 72 hours. Anemia Panel: No results for input(s): VITAMINB12, FOLATE, FERRITIN, TIBC, IRON, RETICCTPCT in the last 72 hours. Sepsis Labs: Recent Labs  Lab 04/22/18 0655  PROCALCITON <0.10    Recent Results (from the past 240 hour(s))  Expectorated sputum assessment w rflx to resp cult     Status: None   Collection Time: 04/14/18  6:30 AM  Result Value Ref Range Status   Specimen Description SPUTUM  Final   Special Requests Normal  Final   Sputum evaluation   Final    THIS SPECIMEN IS ACCEPTABLE FOR SPUTUM CULTURE Performed at [facility], [address], [place], [place] [address]    Report Status 04/14/2018 FINAL  Final  Culture, respiratory     Status: None   Collection Time: 04/14/18  6:30 AM  Result Value Ref Range Status   Specimen Description SPUTUM  Final   Special Requests Normal Reflexed from [id number]  Final   Gram Stain   Final    MODERATE WBC PRESENT,BOTH PMN AND MONONUCLEAR ABUNDANT YEAST WITH PSEUDOHYPHAE Performed at [facility], [place] [address] [place], [place] [address]    Culture   Final    FEW CANDIDA GLABRATA FEW METHICILLIN RESISTANT STAPHYLOCOCCUS AUREUS    Report Status 04/18/2018 FINAL  Final   Organism ID, Bacteria METHICILLIN RESISTANT STAPHYLOCOCCUS AUREUS  Final      Susceptibility   Methicillin resistant staphylococcus aureus - MIC*    CIPROFLOXACIN >=8 RESISTANT Resistant     ERYTHROMYCIN >=8 RESISTANT Resistant     GENTAMICIN <=0.5 SENSITIVE Sensitive     OXACILLIN >=4 RESISTANT Resistant     TETRACYCLINE <=1 SENSITIVE Sensitive     VANCOMYCIN 1 SENSITIVE Sensitive     TRIMETH/SULFA <=10 SENSITIVE Sensitive      CLINDAMYCIN <=0.25 SENSITIVE Sensitive     RIFAMPIN <=0.5 SENSITIVE Sensitive     Inducible Clindamycin NEGATIVE Sensitive     * FEW METHICILLIN RESISTANT STAPHYLOCOCCUS AUREUS     Estimated body mass index is 27.31 kg/m as calculated from the following:   Height as of this encounter: 5\' 4"  (1.626 m).   Weight as of this encounter: 72.2 kg.  Malnutrition Type:  Nutrition Problem: Inadequate oral intake Etiology: decreased appetite   Malnutrition Characteristics:  Signs/Symptoms: per patient/family report, meal completion < 50%   Nutrition Interventions:  Interventions: Glucerna shake   Radiology Studies: Ct Chest Wo Contrast  Result Date: 04/22/2018 CLINICAL DATA:  68 year old female with history of acute hypoxic respiratory failure. RSV pneumonia. Pulmonary edema. History of congestive heart failure. EXAM: CT CHEST WITHOUT CONTRAST TECHNIQUE: Multidetector CT imaging of the chest was performed following the standard protocol without IV contrast. COMPARISON:  Chest CT 01/17/2018. FINDINGS: Cardiovascular: Heart size is mildly enlarged. There is no significant pericardial fluid, thickening or pericardial calcification. There is aortic atherosclerosis, as well as atherosclerosis of the great vessels of the mediastinum and the coronary arteries, including calcified atherosclerotic plaque  in the left anterior descending and right coronary arteries. Coronary artery stent in the proximal left anterior descending coronary artery. Mediastinum/Nodes: No pathologically enlarged mediastinal or hilar lymph nodes. Please note that accurate exclusion of hilar adenopathy is limited on noncontrast CT scans. Esophagus is unremarkable in appearance. No axillary lymphadenopathy. Lungs/Pleura: Widespread areas of ground-glass attenuation are noted throughout the lungs bilaterally, relatively symmetric and predominantly peribronchovascular in distribution. This is associated with extensive interlobular septal  thickening. A few areas of more nodular ground-glass attenuation are also noted throughout the periphery of the lungs. Dependent areas of subsegmental atelectasis are noted in the lower lobes of the lungs bilaterally. Moderate bilateral pleural effusions lying dependently. Upper Abdomen: Aortic atherosclerosis.  Status post cholecystectomy. Musculoskeletal: Chronic appearing compression fractures of T7 and T8, most severe at T8 where there is 20% loss of anterior vertebral body height. There are no aggressive appearing lytic or blastic lesions noted in the visualized portions of the skeleton. IMPRESSION: 1. Appearance of the thorax is most suggestive of congestive heart failure, as detailed above. 2. Some of the pulmonary findings could also be associated with atypical infection, including viral pneumonia, in this patient with documented history of RSV. 3. Aortic atherosclerosis, in addition to 2 vessel coronary artery disease. Please note that although the presence of coronary artery calcium documents the presence of coronary artery disease, the severity of this disease and any potential stenosis cannot be assessed on this non-gated CT examination. Assessment for potential risk factor modification, dietary therapy or pharmacologic therapy may be warranted, if clinically indicated. Aortic Atherosclerosis (ICD10-I70.0). Electronically Signed   By: Vinnie Langton M.D.   On: 04/22/2018 09:54   Dg Chest Port 1 View  Result Date: 04/22/2018 CLINICAL DATA:  Shortness of breath EXAM: PORTABLE CHEST 1 VIEW COMPARISON:  04/21/2018 FINDINGS: Heart is borderline enlarged. Bilateral perihilar and lower lobe airspace opacities with diffuse interstitial prominence. Small left effusion. Findings likely reflect edema/CHF. No acute bony abnormality. No change. IMPRESSION: No significant change in CHF pattern since prior study. Electronically Signed   By: Rolm Baptise M.D.   On: 04/22/2018 07:53   Dg Chest Port 1  View  Result Date: 04/21/2018 CLINICAL DATA:  Shortness of breath, history breast cancer, CHF, diabetes mellitus, hypertension EXAM: PORTABLE CHEST 1 VIEW COMPARISON:  Portable exam 0718 hours compared to 04/20/2018 FINDINGS: Mild enlargement of cardiac silhouette with pulmonary vascular congestion. Mediastinal contours normal Interstitial infiltrates bilaterally, slightly increased on LEFT, favor pulmonary edema and CHF. Small LEFT pleural effusion. No pneumothorax. Bones demineralized. IMPRESSION: Enlargement of cardiac silhouette with vascular congestion and BILATERAL interstitial infiltrates favoring pulmonary edema and CHF. Small LEFT pleural effusion. Electronically Signed   By: Lavonia Dana M.D.   On: 04/21/2018 08:14   Scheduled Meds:  arformoterol  15 mcg Nebulization BID   atorvastatin  80 mg Oral QHS   budesonide (PULMICORT) nebulizer solution  0.5 mg Nebulization BID   busPIRone  15 mg Oral BID   carbamide peroxide  5 drop Right EAR BID   carvedilol  25 mg Oral BID WC   divalproex  1,000 mg Oral Daily   furosemide  80 mg Intravenous BID   guaiFENesin  1,200 mg Oral BID   hydrALAZINE  100 mg Oral Q8H   insulin aspart  0-5 Units Subcutaneous QHS   insulin aspart  0-9 Units Subcutaneous TID WC   isosorbide dinitrate  20 mg Oral TID   levothyroxine  200 mcg Oral Daily   Melatonin  3 mg  Oral QHS   methylPREDNISolone (SOLU-MEDROL) injection  40 mg Intravenous Q12H   morphine  15 mg Oral BID   multivitamin with minerals  1 tablet Oral Daily   pantoprazole  40 mg Oral Daily   sodium chloride flush  3 mL Intravenous Q12H   sodium polystyrene  15 g Oral Q8H   ticagrelor  90 mg Oral BID   Continuous Infusions:  albumin human 25 g (04/22/18 1246)     LOS: 11 days   Bonnell Public, DO [facility] PAGER is on AMION  If 7PM-7AM, please contact night-coverage [email or web address] Password [email or web address] 04/22/2018, 1:59 PM

## 2018-04-22 NOTE — Progress Notes (Signed)
Progress Note  Patient Name: Andrea [name] Date of Encounter: 04/22/2018  Primary Cardiologist: Kirk Ruths, MD   Subjective   Continues with CP (with cough) and dyspnea; nonproductive cough  Inpatient Medications    Scheduled Meds: . arformoterol  15 mcg Nebulization BID  . atorvastatin  80 mg Oral QHS  . budesonide (PULMICORT) nebulizer solution  0.5 mg Nebulization BID  . busPIRone  15 mg Oral BID  . carbamide peroxide  5 drop Right EAR BID  . carvedilol  12.5 mg Oral BID WC  . divalproex  1,000 mg Oral Daily  . furosemide  40 mg Intravenous BID  . guaiFENesin  1,200 mg Oral BID  . hydrALAZINE  100 mg Oral Q8H  . insulin aspart  0-5 Units Subcutaneous QHS  . insulin aspart  0-9 Units Subcutaneous TID WC  . isosorbide dinitrate  20 mg Oral TID  . levothyroxine  200 mcg Oral Daily  . Melatonin  3 mg Oral QHS  . methylPREDNISolone (SOLU-MEDROL) injection  40 mg Intravenous Q12H  . morphine  15 mg Oral BID  . multivitamin with minerals  1 tablet Oral Daily  . pantoprazole  40 mg Oral Daily  . sodium chloride flush  3 mL Intravenous Q12H  . ticagrelor  90 mg Oral BID   Continuous Infusions:  PRN Meds: acetaminophen, albuterol, benzonatate, chlorpheniramine-HYDROcodone, hydrALAZINE, HYDROmorphone (DILAUDID) injection, loperamide, metoprolol tartrate, ondansetron **OR** ondansetron (ZOFRAN) IV, oxyCODONE, traMADol   Vital Signs    Vitals:   04/22/18 0500 04/22/18 0511 04/22/18 0600 04/22/18 0634  BP: (!) 193/91 (!) 181/83 (!) 169/80 (!) 160/70  Pulse:  92  96  Resp:      Temp:      TempSrc:      SpO2:      Weight:      Height:        Intake/Output Summary (Last 24 hours) at 04/22/2018 0850 Last data filed at 04/22/2018 0800 Gross per 24 hour  Intake 860 ml  Output 500 ml  Net 360 ml   Last 3 Weights 04/22/2018 04/21/2018 04/20/2018  Weight (lbs) 159 lb 1.6 oz 160 lb 157 lb 3.2 oz  Weight (kg) 72.167 kg 72.576 kg 71.305 kg      Telemetry    Sinus-  Personally Reviewed   Physical Exam   GEN: No acute distress.   Neck: No JVD Cardiac: RRR, no murmurs, rubs, or gallops.  Respiratory: Mild basilar crackles GI: Soft, nontender, non-distended  MS: 1+ ankle edema Neuro:  Nonfocal  Psych: Normal affect   Labs    Chemistry Recent Labs  Lab 04/20/18 0641 04/21/18 0626 04/21/18 0756  04/21/18 2035 04/21/18 2351 04/22/18 0520  NA 134* 132* 134*  --   --   --  132*  K 5.4* 6.4* 6.5*   < > 5.8* 5.6* 5.6*  CL 99 96* 94*  --   --   --  95*  CO2 28 28 27   --   --   --  27  GLUCOSE 113* 176* 135*  --   --   --  160*  BUN 46* 49* 49*  --   --   --  50*  CREATININE 1.67* 1.92* 1.96*  --   --   --  1.90*  CALCIUM 8.1* 8.6* 9.0  --   --   --  8.6*  PROT 5.2* 6.2*  --   --   --   --  6.1*  ALBUMIN 2.0* 2.2*  --   --   --   --  2.2*  AST 26 25  --   --   --   --  31  ALT 12 15  --   --   --   --  14  ALKPHOS 45 49  --   --   --   --  51  BILITOT 0.4 0.7  --   --   --   --  0.7  GFRNONAA 31* 26* 26*  --   --   --  27*  GFRAA 36* 31* 30*  --   --   --  31*  ANIONGAP 7 8 13   --   --   --  10   < > = values in this interval not displayed.     Hematology Recent Labs  Lab 04/20/18 0641 04/21/18 0448 04/22/18 0520  WBC 5.3 4.2 7.9  RBC 3.04* 3.17* 3.29*  HGB 8.4* 9.1* 9.9*  HCT 27.3* 28.5* 29.6*  MCV 89.8 89.9 90.0  MCH 27.6 28.7 30.1  MCHC 30.8 31.9 33.4  RDW 14.3 14.1 14.0  PLT 226 304 426*    BNP Recent Labs  Lab 04/17/18 0202 04/22/18 0520  BNP 3,208.7* 2,365.5*     Radiology    Dg Chest Port 1 View  Result Date: 04/22/2018 CLINICAL DATA:  Shortness of breath EXAM: PORTABLE CHEST 1 VIEW COMPARISON:  04/21/2018 FINDINGS: Heart is borderline enlarged. Bilateral perihilar and lower lobe airspace opacities with diffuse interstitial prominence. Small left effusion. Findings likely reflect edema/CHF. No acute bony abnormality. No change. IMPRESSION: No significant change in CHF pattern since prior study. Electronically  Signed   By: Rolm Baptise M.D.   On: 04/22/2018 07:53   Dg Chest Port 1 View  Result Date: 04/21/2018 CLINICAL DATA:  Shortness of breath, history breast cancer, CHF, diabetes mellitus, hypertension EXAM: PORTABLE CHEST 1 VIEW COMPARISON:  Portable exam 0718 hours compared to 04/20/2018 FINDINGS: Mild enlargement of cardiac silhouette with pulmonary vascular congestion. Mediastinal contours normal Interstitial infiltrates bilaterally, slightly increased on LEFT, favor pulmonary edema and CHF. Small LEFT pleural effusion. No pneumothorax. Bones demineralized. IMPRESSION: Enlargement of cardiac silhouette with vascular congestion and BILATERAL interstitial infiltrates favoring pulmonary edema and CHF. Small LEFT pleural effusion. Electronically Signed   By: Lavonia Dana M.D.   On: 04/21/2018 08:14    Patient Profile     68 year old female with past medical history of coronary artery disease status post PCI of LAD, ischemic cardiomyopathy improved on most recent echocardiogram, diabetes mellitus, hypertension, chronic stage IV kidney disease, seizure disorder for evaluation of chest pain and acute on chronic diastolic congestive heart failure.  Patient is status post PCI of LAD December 2019.  Admitted March 21 with dyspnea, chest pressure and lower extremity edema.  Treated for pneumonia and also noted to have RSV.    Last echocardiogram February 2020 showed normal LV function.  Assessment & Plan    1 chest pain-no change in symptoms.  Symptoms increased with cough.  No plans for further ischemia evaluation.    2 acute on chronic diastolic congestive heart failure-most recent echocardiogram showed improvement in LV function.  I/O+120. Wt 159. Remains mildly volume overloaded.    Continue Lasix 40 mg IV twice daily.  Follow renal function.  3 coronary artery disease-status post recent PCI in December.    Patient was not on antiplatelet medications at time of admission as she stated she could not  tolerate aspirin and had a rash with Plavix.  I have reinitiated Brilinta to see if  she will tolerate.  There is risk of stent thrombosis off of antiplatelet medications.  4 hyperkalemia-some improvement this morning.  Continue to follow.  5 chronic stage IV kidney disease-follow renal function closely with diuresis.  6 hypertension-blood pressure remains elevated.  Increase carvedilol to 25 mg twice daily and follow.   7 pneumonia-COPD-pulmonary toilet per primary care.  For questions or updates, please contact [place] Please consult [email or web address] for contact info under        Signed, Kirk Ruths, MD  04/22/2018, 8:50 AM

## 2018-04-22 NOTE — Progress Notes (Signed)
Physical Therapy Treatment Patient Details Name: Andrea [name] MRN: [id number] DOB: [birth year] Today's Date: 04/22/2018    History of Present Illness Pt is a 68 y.o. female admitted from SNF on 04/11/18 with respiratory distress and hypoxia. Treated for pneumonia and also noted to have RSV. PMH includes CHF, CAD, ischemic cardiomyopathy.    PT Comments    Pt slowly progressing with mobility. Continues to be limited by generalized weakness and decreased activity tolerance. At high risk for falls. Pt agreeable to continued SNF-level therapies to maximize functional mobility and independence before return home.    Follow Up Recommendations  SNF;Supervision/Assistance - 24 hour     Equipment Recommendations  (TBD)    Recommendations for Other Services       Precautions / Restrictions Precautions Precautions: Fall Restrictions Weight Bearing Restrictions: No    Mobility  Bed Mobility Overal bed mobility: Needs Assistance Bed Mobility: Supine to Sit;Sit to Supine     Supine to sit: Supervision Sit to supine: Supervision   General bed mobility comments: Increased time and effort  Transfers Overall transfer level: Needs assistance Equipment used: Rolling walker (2 wheeled) Transfers: Sit to/from Stand Sit to Stand: Supervision            Ambulation/Gait Ambulation/Gait assistance: Supervision;Min guard Gait Distance (Feet): 250 Feet Assistive device: Rolling walker (2 wheeled) Gait Pattern/deviations: Step-through pattern;Decreased stride length;Trunk flexed Gait velocity: Decreased  Gait velocity interpretation: <1.31 ft/sec, indicative of household ambulator General Gait Details: Very slow, fatigued gait with RW; intermittent min guard as pt veering outside of RW, but able to self-correct. Required assist to push O2 tank   Stairs             Wheelchair Mobility    Modified Rankin (Stroke Patients Only)       Balance Overall balance  assessment: Needs assistance Sitting-balance support: No upper extremity supported;Feet supported Sitting balance-Leahy Scale: Good     Standing balance support: Bilateral upper extremity supported;During functional activity Standing balance-Leahy Scale: Fair Standing balance comment: Can static stand without UE support                            Cognition Arousal/Alertness: Awake/alert Behavior During Therapy: Flat affect Overall Cognitive Status: No family/caregiver present to determine baseline cognitive functioning Area of Impairment: Safety/judgement;Awareness                         Safety/Judgement: Decreased awareness of safety;Decreased awareness of deficits Awareness: Emergent          Exercises      General Comments        Pertinent Vitals/Pain Pain Assessment: No/denies pain    Home Living                      Prior Function            PT Goals (current goals can now be found in the care plan section) Acute Rehab PT Goals Patient Stated Goal: Agreeable to continued rehab at SNF PT Goal Formulation: With patient Time For Goal Achievement: 04/26/18 Potential to Achieve Goals: Good Progress towards PT goals: Progressing toward goals    Frequency    Min 2X/week      PT Plan Current plan remains appropriate    Co-evaluation              AM-PAC PT "6 Clicks" Mobility   Outcome Measure  Help needed turning from your back to your side while in a flat bed without using bedrails?: None Help needed moving from lying on your back to sitting on the side of a flat bed without using bedrails?: None Help needed moving to and from a bed to a chair (including a wheelchair)?: A Little Help needed standing up from a chair using your arms (e.g., wheelchair or bedside chair)?: A Little Help needed to walk in hospital room?: A Little Help needed climbing 3-5 steps with a railing? : A Lot 6 Click Score: 19    End of  Session Equipment Utilized During Treatment: Gait belt;Oxygen Activity Tolerance: Patient limited by fatigue Patient left: in bed;with call bell/phone within reach Nurse Communication: Mobility status PT Visit Diagnosis: Muscle weakness (generalized) (M62.81);Unsteadiness on feet (R26.81)     Time: 6811-5726 PT Time Calculation (min) (ACUTE ONLY): 19 min  Charges:  $Gait Training: 8-22 mins                    Mabeline Caras, PT, DPT Acute Rehabilitation Services  Pager [phone] Office [place] 04/22/2018, 12:18 PM

## 2018-04-22 NOTE — Progress Notes (Signed)
Occupational Therapy Treatment Patient Details Name: Andrea [name] MRN: [id number] DOB: [birth year] Today's Date: 04/22/2018    History of present illness 69yow from [facility] PMHdiastolic CHF, 3VCAD, ischemiccardiomyopathypresented via EMS with resp distress, hypoxia tachypnea, rales, "fluid on her". Treated with BiPAP and now weaned to [facility].   OT comments  Pt continues to progress towards OT goals this session. Supervision with RW for transfers in a in room mobility for toilet transfer, peri care and sink level grooming. Pt returned to bed at the end of session and was able to perform at supervision level. O2 saturations remained above 90% throughout on 2L. Continue to recommend SNF placement for activity tolerance, and HIGH FALL risk.   Follow Up Recommendations  SNF;Supervision/Assistance - 24 hour    Equipment Recommendations       Recommendations for Other Services      Precautions / Restrictions Precautions Precautions: Fall Precaution Comments: watch 02 Restrictions Weight Bearing Restrictions: No       Mobility Bed Mobility Overal bed mobility: Needs Assistance Bed Mobility: Sit to Supine     Supine to sit: Supervision Sit to supine: Supervision   General bed mobility comments: Increased time and effort  Transfers Overall transfer level: Needs assistance Equipment used: Rolling walker (2 wheeled) Transfers: Sit to/from Stand Sit to Stand: Supervision              Balance Overall balance assessment: Needs assistance Sitting-balance support: No upper extremity supported;Feet supported Sitting balance-Leahy Scale: Good     Standing balance support: Bilateral upper extremity supported;During functional activity Standing balance-Leahy Scale: Fair Standing balance comment: can release walker at sink without LOB                           ADL either performed or assessed with clinical judgement   ADL Overall ADL's : Needs  assistance/impaired     Grooming: Wash/dry hands;Standing;Supervision/safety           Upper Body Dressing : Set up;Sitting Upper Body Dressing Details (indicate cue type and reason): gown like robe Lower Body Dressing: Set up;Sitting/lateral leans Lower Body Dressing Details (indicate cue type and reason): socks Toilet Transfer: Supervision/safety;Ambulation;RW;BSC   Toileting- Water quality scientist and Hygiene: Supervision/safety;Sit to/from stand       Functional mobility during ADLs: Supervision/safety;Rolling walker General ADL Comments: Pt maintaining Sp02 in mid 90s on 2L.     Vision       Perception     Praxis      Cognition Arousal/Alertness: Awake/alert Behavior During Therapy: Flat affect Overall Cognitive Status: Within Functional Limits for tasks assessed Area of Impairment: Safety/judgement;Awareness                         Safety/Judgement: Decreased awareness of safety;Decreased awareness of deficits Awareness: Emergent            Exercises     Shoulder Instructions       General Comments      Pertinent Vitals/ Pain       Pain Assessment: No/denies pain Pain Intervention(s): Monitored during session  Home Living                                          Prior Functioning/Environment              Frequency  Min  3X/week        Progress Toward Goals  OT Goals(current goals can now be found in the care plan section)  Progress towards OT goals: Progressing toward goals  Acute Rehab OT Goals Patient Stated Goal: to feel better OT Goal Formulation: With patient Time For Goal Achievement: 04/27/18 Potential to Achieve Goals: Good  Plan Discharge plan remains appropriate    Co-evaluation                 AM-PAC OT "6 Clicks" Daily Activity     Outcome Measure   Help from another person eating meals?: None Help from another person taking care of personal grooming?: A Little Help from  another person toileting, which includes using toliet, bedpan, or urinal?: A Little Help from another person bathing (including washing, rinsing, drying)?: A Little Help from another person to put on and taking off regular upper body clothing?: None Help from another person to put on and taking off regular lower body clothing?: A Little 6 Click Score: 20    End of Session Equipment Utilized During Treatment: Rolling walker;Oxygen  OT Visit Diagnosis: Muscle weakness (generalized) (M62.81);Adult, failure to thrive (R62.7)   Activity Tolerance Patient tolerated treatment well   Patient Left in chair;with call bell/phone within reach   Nurse Communication          Time: 2334-3568 OT Time Calculation (min): 21 min  Charges: OT General Charges $OT Visit: 1 Visit OT Treatments $Self Care/Home Management : 8-22 mins  Hulda Humphrey OTR/L Acute Rehabilitation Services Pager: [phone] Office: [place] 04/22/2018, 2:04 PM

## 2018-04-23 LAB — MAGNESIUM: Magnesium: 1.9 mg/dL (ref 1.7–2.4)

## 2018-04-23 LAB — RENAL FUNCTION PANEL
Albumin: 3 g/dL — ABNORMAL LOW (ref 3.5–5.0)
Anion gap: 8 (ref 5–15)
BUN: 54 mg/dL — ABNORMAL HIGH (ref 8–23)
CO2: 29 mmol/L (ref 22–32)
Calcium: 8.4 mg/dL — ABNORMAL LOW (ref 8.9–10.3)
Chloride: 100 mmol/L (ref 98–111)
Creatinine, Ser: 1.9 mg/dL — ABNORMAL HIGH (ref 0.44–1.00)
GFR calc Af Amer: 31 mL/min — ABNORMAL LOW (ref >60)
GFR calc non Af Amer: 27 mL/min — ABNORMAL LOW (ref >60)
Glucose, Bld: 163 mg/dL — ABNORMAL HIGH (ref 70–99)
Phosphorus: 4.1 mg/dL (ref 2.5–4.6)
Potassium: 4.8 mmol/L (ref 3.5–5.1)
Sodium: 137 mmol/L (ref 135–145)

## 2018-04-23 LAB — POTASSIUM: Potassium: 4.9 mmol/L (ref 3.5–5.1)

## 2018-04-23 LAB — GLUCOSE, CAPILLARY
Glucose-Capillary: 156 mg/dL — ABNORMAL HIGH (ref 70–99)
Glucose-Capillary: 145 mg/dL — ABNORMAL HIGH (ref 70–99)
Glucose-Capillary: 144 mg/dL — ABNORMAL HIGH (ref 70–99)
Glucose-Capillary: 158 mg/dL — ABNORMAL HIGH (ref 70–99)

## 2018-04-23 MED ORDER — LORAZEPAM 2 MG/ML IJ SOLN
1.0000 mg | INTRAMUSCULAR | Status: DC | PRN
  Administered 2018-04-23 – 2018-04-24 (×2): 1 mg via INTRAVENOUS
  Filled 2018-04-23 (×2): qty 1

## 2018-04-23 MED ORDER — AMLODIPINE BESYLATE 2.5 MG PO TABS
5.0000 mg | ORAL_TABLET | Freq: Every day | ORAL | Status: DC
  Administered 2018-04-23 – 2018-04-26 (×4): 5 mg via ORAL
  Filled 2018-04-23 (×4): qty 2

## 2018-04-23 MED ORDER — FUROSEMIDE 10 MG/ML IJ SOLN: 40.0000 mg | Freq: Two times a day (BID) | INTRAMUSCULAR | Status: DC

## 2018-04-23 MED ORDER — FUROSEMIDE 10 MG/ML IJ SOLN
80.0000 mg | Freq: Two times a day (BID) | INTRAMUSCULAR | Status: DC
  Administered 2018-04-23: 80 mg via INTRAVENOUS
  Filled 2018-04-23 (×2): qty 8

## 2018-04-23 NOTE — Progress Notes (Signed)
Nutrition Follow-up  DOCUMENTATION CODES:   Not applicable  INTERVENTION:   -Continue MVI with minerals daily -Hormel Shake TID with meals, each supplement provides 520 kcals and 22 grams protein -Magic Cup TID with meals, each supplement provides 290 kcals and 9 grams protein -If poor oral intake persists, may need consider at least short term, supplemental nutrition support  NUTRITION DIAGNOSIS:   Inadequate oral intake related to decreased appetite as evidenced by per patient/family report, meal completion < 50%.  Ongoing  GOAL:   Patient will meet greater than or equal to 90% of their needs  Unmet  MONITOR:   PO intake, Supplement acceptance, Labs, Skin, Weight trends, I & O's  REASON FOR ASSESSMENT:   Consult Assessment of nutrition requirement/status  ASSESSMENT:   68 yo with PMH diastolic CHF, 3VCAD, ischemic cardiomyopathy presented via EMS with resp distress, hypoxia tachypnea, rales, "fluid on her". Treated with BiPAP and then weaned to 3L [place].  Reviewed I/O's: -7.6 ml x 24 hours and +2.3 L since admission  UOP: +1.3 L since admission  Po 25-80% Pt receiving nursing care at time of visit. Intake continues to be poor; noted documented meal completion 25-80%, usually averaging around 25-50%. Pt has been refusing most supplements (Ensure, Prostat, and Boost Breeze). Noted MD discontinued Prostat and Boost Breeze due to multiple refusals. Per discussion with pt two days ago, her biggest complaint was nausea. Since she continues to have poor oral intake despite RD interventions, may need to consider short term, supplemental nutrition support if this persists.   Labs reviewed: CBGS: 134-156 (inpatient orders for glycemic control are 0-5 units insulin aspart q HS and 0-9 units insulin aspart TID with meals).   Diet Order:   Diet Order            Diet Heart Room service appropriate? Yes; Fluid consistency: Thin; Fluid restriction: 1500 mL Fluid  Diet effective now               EDUCATION NEEDS:   Not appropriate for education at this time  Skin:  Skin Assessment: Reviewed RN Assessment  Last BM:  04/22/18  Height:   Ht Readings from Last 1 Encounters:  04/19/18 5\' 4"  (1.626 m)    Weight:   Wt Readings from Last 1 Encounters:  04/23/18 72.7 kg    Ideal Body Weight:  54.5 kg  BMI:  Body mass index is 27.52 kg/m.  Estimated Nutritional Needs:   Kcal:  1700-1900  Protein:  85-100 gm  Fluid:  1.7-1.9 L    Terrika Zuver A. Jimmye Norman, RD, LDN, [place] Registered Dietitian II Certified Diabetes Care and Education Specialist Pager: [phone] After hours Pager: [phone]

## 2018-04-23 NOTE — Progress Notes (Signed)
Progress Note  Patient Name: Andrea [name] Date of Encounter: 04/23/2018  Primary Cardiologist: Kirk Ruths, MD   Subjective   Continues with CP (with cough); dyspnea; now with productive cough  Inpatient Medications    Scheduled Meds: . arformoterol  15 mcg Nebulization BID  . atorvastatin  80 mg Oral QHS  . budesonide (PULMICORT) nebulizer solution  0.5 mg Nebulization BID  . busPIRone  15 mg Oral BID  . carbamide peroxide  5 drop Right EAR BID  . carvedilol  25 mg Oral BID WC  . divalproex  1,000 mg Oral Daily  . furosemide  80 mg Intravenous BID  . guaiFENesin  1,200 mg Oral BID  . hydrALAZINE  100 mg Oral Q8H  . insulin aspart  0-5 Units Subcutaneous QHS  . insulin aspart  0-9 Units Subcutaneous TID WC  . isosorbide dinitrate  20 mg Oral TID  . levothyroxine  200 mcg Oral Daily  . Melatonin  3 mg Oral QHS  . methylPREDNISolone (SOLU-MEDROL) injection  40 mg Intravenous Q12H  . morphine  15 mg Oral BID  . multivitamin with minerals  1 tablet Oral Daily  . pantoprazole  40 mg Oral Daily  . sodium chloride flush  3 mL Intravenous Q12H  . ticagrelor  90 mg Oral BID   Continuous Infusions: . albumin human 25 g (04/23/18 0536)   PRN Meds: acetaminophen, albuterol, benzonatate, chlorpheniramine-HYDROcodone, hydrALAZINE, HYDROmorphone (DILAUDID) injection, loperamide, ondansetron **OR** ondansetron (ZOFRAN) IV, oxyCODONE   Vital Signs    Vitals:   04/22/18 2053 04/22/18 2100 04/23/18 0527 04/23/18 0541  BP: (!) 162/75 (!) 169/80 (!) 177/93   Pulse: 98 98 (!) 103   Resp: 20 17 (!) 22   Temp: 97.6 F (36.4 C) 97.8 F (36.6 C) 97.7 F (36.5 C)   TempSrc: Oral Oral Oral   SpO2: 97%  94%   Weight:    72.7 kg  Height:        Intake/Output Summary (Last 24 hours) at 04/23/2018 0849 Last data filed at 04/23/2018 0830 Gross per 24 hour  Intake 1267.42 ml  Output 1275 ml  Net -7.58 ml   Last 3 Weights 04/23/2018 04/22/2018 04/21/2018  Weight (lbs) 160 lb 4.8  oz 159 lb 1.6 oz 160 lb  Weight (kg) 72.712 kg 72.167 kg 72.576 kg      Telemetry    Sinus- Personally Reviewed   Physical Exam   GEN: NAD Neck: supple Cardiac: RRR Respiratory: mildly diminished BS GI: Soft, NT ND MS: trace ankle edema Neuro:  Grossly intact Psych: Normal affect   Labs    Chemistry Recent Labs  Lab 04/20/18 0641 04/21/18 0626  04/22/18 0520  04/22/18 1931 04/22/18 2330 04/23/18 0403  NA 134* 132*   < > 132*  --  137  --  137  K 5.4* 6.4*   < > 5.6*   < > 5.2*  5.2* 4.9 4.8  CL 99 96*   < > 95*  --  98  --  100  CO2 28 28   < > 27  --  31  --  29  GLUCOSE 113* 176*   < > 160*  --  164*  --  163*  BUN 46* 49*   < > 50*  --  51*  --  54*  CREATININE 1.67* 1.92*   < > 1.90*  --  1.95*  --  1.90*  CALCIUM 8.1* 8.6*   < > 8.6*  --  8.5*  --  8.4*  PROT 5.2* 6.2*  --  6.1*  --   --   --   --   ALBUMIN 2.0* 2.2*  --  2.2*  --  2.6*  --  3.0*  AST 26 25  --  31  --   --   --   --   ALT 12 15  --  14  --   --   --   --   ALKPHOS 45 49  --  51  --   --   --   --   BILITOT 0.4 0.7  --  0.7  --   --   --   --   GFRNONAA 31* 26*   < > 27*  --  26*  --  27*  GFRAA 36* 31*   < > 31*  --  30*  --  31*  ANIONGAP 7 8   < > 10  --  8  --  8   < > = values in this interval not displayed.     Hematology Recent Labs  Lab 04/20/18 0641 04/21/18 0448 04/22/18 0520  WBC 5.3 4.2 7.9  RBC 3.04* 3.17* 3.29*  HGB 8.4* 9.1* 9.9*  HCT 27.3* 28.5* 29.6*  MCV 89.8 89.9 90.0  MCH 27.6 28.7 30.1  MCHC 30.8 31.9 33.4  RDW 14.3 14.1 14.0  PLT 226 304 426*    BNP Recent Labs  Lab 04/17/18 0202 04/22/18 0520  BNP 3,208.7* 2,365.5*     Radiology    Ct Chest Wo Contrast  Result Date: 04/22/2018 CLINICAL DATA:  69 year old female with history of acute hypoxic respiratory failure. RSV pneumonia. Pulmonary edema. History of congestive heart failure. EXAM: CT CHEST WITHOUT CONTRAST TECHNIQUE: Multidetector CT imaging of the chest was performed following the  standard protocol without IV contrast. COMPARISON:  Chest CT 01/17/2018. FINDINGS: Cardiovascular: Heart size is mildly enlarged. There is no significant pericardial fluid, thickening or pericardial calcification. There is aortic atherosclerosis, as well as atherosclerosis of the great vessels of the mediastinum and the coronary arteries, including calcified atherosclerotic plaque in the left anterior descending and right coronary arteries. Coronary artery stent in the proximal left anterior descending coronary artery. Mediastinum/Nodes: No pathologically enlarged mediastinal or hilar lymph nodes. Please note that accurate exclusion of hilar adenopathy is limited on noncontrast CT scans. Esophagus is unremarkable in appearance. No axillary lymphadenopathy. Lungs/Pleura: Widespread areas of ground-glass attenuation are noted throughout the lungs bilaterally, relatively symmetric and predominantly peribronchovascular in distribution. This is associated with extensive interlobular septal thickening. A few areas of more nodular ground-glass attenuation are also noted throughout the periphery of the lungs. Dependent areas of subsegmental atelectasis are noted in the lower lobes of the lungs bilaterally. Moderate bilateral pleural effusions lying dependently. Upper Abdomen: Aortic atherosclerosis.  Status post cholecystectomy. Musculoskeletal: Chronic appearing compression fractures of T7 and T8, most severe at T8 where there is 20% loss of anterior vertebral body height. There are no aggressive appearing lytic or blastic lesions noted in the visualized portions of the skeleton. IMPRESSION: 1. Appearance of the thorax is most suggestive of congestive heart failure, as detailed above. 2. Some of the pulmonary findings could also be associated with atypical infection, including viral pneumonia, in this patient with documented history of RSV. 3. Aortic atherosclerosis, in addition to 2 vessel coronary artery disease. Please  note that although the presence of coronary artery calcium documents the presence of coronary artery disease, the severity of this disease and any potential  stenosis cannot be assessed on this non-gated CT examination. Assessment for potential risk factor modification, dietary therapy or pharmacologic therapy may be warranted, if clinically indicated. Aortic Atherosclerosis (ICD10-I70.0). Electronically Signed   By: Vinnie Langton M.D.   On: 04/22/2018 09:54   Dg Chest Port 1 View  Result Date: 04/22/2018 CLINICAL DATA:  Shortness of breath EXAM: PORTABLE CHEST 1 VIEW COMPARISON:  04/21/2018 FINDINGS: Heart is borderline enlarged. Bilateral perihilar and lower lobe airspace opacities with diffuse interstitial prominence. Small left effusion. Findings likely reflect edema/CHF. No acute bony abnormality. No change. IMPRESSION: No significant change in CHF pattern since prior study. Electronically Signed   By: Rolm Baptise M.D.   On: 04/22/2018 07:53    Patient Profile     68 year old female with past medical history of coronary artery disease status post PCI of LAD, ischemic cardiomyopathy improved on most recent echocardiogram, diabetes mellitus, hypertension, chronic stage IV kidney disease, seizure disorder for evaluation of chest pain and acute on chronic diastolic congestive heart failure.  Patient is status post PCI of LAD December 2019.  Admitted March 21 with dyspnea, chest pressure and lower extremity edema.  Treated for pneumonia and also noted to have RSV.    Last echocardiogram February 2020 showed normal LV function.  Assessment & Plan    1 chest pain-no change in symptoms.  Symptoms increased with cough.  No plans for further ischemia evaluation.    2 acute on chronic diastolic congestive heart failure-most recent echocardiogram showed improvement in LV function.  I/O-76. Wt 160.  Volume status improving.  Decrease Lasix to 40 mg twice daily and follow renal function.  3 coronary  artery disease-status post recent PCI in December.    Patient was not on antiplatelet medications at time of admission as she stated she could not tolerate aspirin and had a rash with Plavix.  I have reinitiated Brilinta; appears to be tolerating at this point.  4 hyperkalemia-resolved  5 chronic stage IV kidney disease-follow renal function closely with diuresis.  6 hypertension-blood pressure remains elevated.  Increase carvedilol to 25 mg twice daily and follow.  Add amlodipine 5 mg daily.   7 pneumonia-COPD-pulmonary toilet per primary care.  For questions or updates, please contact [place] Please consult [email or web address] for contact info under        Signed, Kirk Ruths, MD  04/23/2018, 8:49 AM

## 2018-04-23 NOTE — Progress Notes (Signed)
PROGRESS NOTE    Andrea [name]  ZLD:[id number] DOB: 06/11/1950 DOA: 04/11/2018 PCP: Medicine, [facility]   Brief Narrative:  The patient is a 68 year old female 68yo from [facility] with past medical history significant for ischemic cardiomyopathy and CHF w/ recent improved EF, 3V CAD s/p PCI of LAD Dec 2019 / not a CABG candidate,HTN, Seizure disorder,DM, Hx of Breast Cancer and other comorbities who presented with resp distresswith sudden worsening of SOB, chest pressure, and leg edema. She was found to be RSV positive, and required noninvasive mechanical ventilation (BiPAP) and diuresis. She was also recently hospitalized from2/23 - 3/6 for CHF, AKI, and confusion from narcotics.    04/22/2018: Cardiac BNP remains elevated.  Hyperkalemia persists.  CT chest without contrast was suggestive of congestive heart failure, however, atypical pneumonia could not be excluded.  Procalcitonin is less than 0.1.  Patient is off antibiotics.  Albumin is 2.2.  Volume overload persists.  Patient has significant chronic kidney disease, with serum creatinine of 1.9.  Will increase the dose of IV Lasix to 80 mg twice daily.  Will optimize volume.  Low threshold to administer albumin (though controversial, may enhance diuretic function).  Overall, patient seems to be improving, not on 2 L of supplemental oxygen.  Will manage hyperkalemia, will monitor renal function and electrolytes closely.  No fever or chills.  04/23/2018: Patient seen.  Significant peripheral edema persists.  As per documentation, patient is only 7.6 cc negative over the last 24 hours.  Serum creatinine is stable at 1.9.  Hyperkalemia has resolved.  Admission with on 04/19/2018 was 70 kg, and last documented weight was 72.7 kg.  We will continue IV Lasix at 80 mg twice daily (considering his significant edema, tendency towards hyperkalemia, significant renal impairment with serum creatinine of 1.9, and excess of 2.7 kg in  weight as at last measurement compared to admission weight).  Continue to monitor input and output strictly.  Monitor renal function and electrolytes as well.  Overall, patient seems to be improving slowly.  Assessment & Plan:   Principal Problem:   Acute hypoxemic respiratory failure (HCC) Active Problems:   Chronic pain   Chest pain   Acute respiratory failure with hypoxia (HCC)   Anemia   Acute on chronic diastolic CHF (congestive heart failure) (HCC)   RSV (respiratory syncytial virus pneumonia)   DM type 2 (diabetes mellitus, type 2) (HCC)   CKD (chronic kidney disease), stage IV (HCC)  Acute Hypoxic Respiratory Failure - RSV pneumonia+ pulmonary edema in the setting of Acute on Chronic Diastolic CHF and chronic kidney disease stage III with probable mild AKI: -Presented with hypoxia, tachycardia, tachypnea placed on BiPAP; Now weaned off of BiPAP and is on 2 Liters  -Chest x-ray done on admission which revealed bilateral pulmonary infiltrates suggestive of multilobar pneumonia -Respiratory panel positive for RSV -Completed 5 days of IV Zosyn  -Blood cultures x2 04/11/2018 showed NGTD at 5 Dasy -Sputum Gram Stain showed Moderate WBC Present, Both PMN and Mononuclear with Abudant Yeast with Psuedohyphae and Cx showed Few Candida Glabrata and Few MRSA which was Resistant to Ciprofloxacin, Erythromycin, and Oxacillin; ? if she is colonized with MRSA;  -As per prior documentation, infectious disease team is of the impression that the Candida glabrata is insignificant and the MRSA is likely a colonization as she has been afebrile with no leukocytosis and no worsening hypoxemic symptoms -Patient is requiring only 2 L of supplemental oxygen via nasal cannula.   -Continuous pulse oximetry  and maintain O2 saturation greater than 90% -CT scan of the chest done without contrast on 04/22/2018 is suggestive of likely congestive heart failure, however, atypical infection cannot be ruled  out. -Procalcitonin is less than 0.1. -Continue IV diuretics (Lasix) at 80 mg p.o. twice daily. -Albumin is 2.2, currently on IV albumin.  IV albumin may also enhance diuretic function (controversial). -Continue nebulizer treatment. -Continue to manage expectantly.  Acute on Chronic Diastolic CHF  -Hx of recovered Systolic CHF Dec 1610 (EF 30-35% TTE) -EF 03/05/18 noted EF 55-60% w/ mild LVH, normal RV fxn, and no valvular issues  -BNP remains significantly elevated.   -Continue current dose of diuretics, but monitor very closely.     -Cardiology input is highly appreciated.   -Optimize volume.   -Strict I's and O's.    CAD -Cardiology input is highly appreciated.   -S/p PCI Dec 2019 (prox LAD - 2 other lesions not amenable - not CABG candidate)  -Cardiology has advised trial of Brilinta.   -Was on Plavix at time of d/c 03/27/18, but patient told Dr. Thereasa Solo it also caused a rash and was therefore discontinued  -No chest pain.  Acute Kidney Injury on CKD Stage 3/4 -Baseline creatinine 1.86 with GFR of 28  -Serum creatinine is down to 1.9 today.   -Continue to monitor closely as the dose of diuretics will be increased.   -Strict I's and O's. -Hyperkalemia has resolved. -Serum creatinine remains stable.  Uncontrolled HTN -Continue to optimize. -On Coreg 25 Mg p.o. twice daily, IV Lasix at a new dose of 80 mg twice daily, hydralazine 100 Mg p.o. every 8 hourly and isosorbide dinitrate 20 mg orally 3 times daily.  Diarrhea, improved  -No diarrhea stools reported.  Normocytic anemia of unclear etiology  /Anemia of Chronic Kidney Disease Stage 4 -Typed and Screened and transfused 2 units of pRBC's -Hemoglobin is stable today at 9.9 g/dL. -CT abdom/pelvis to r/o RPH (on lovenox for DVT prophy) showed No evidence of intraabdominal/pelvic hemorrhage or explanation for decreased hemoglobin. -Hgb/Hct trended down to 6.9/21.3  -Continue to monitor closely.  Type 2 diabetes  complicated by CKD -RUE4V 03/15/2018 was 6.4  -Continue to optimize.  Hyperkalemia -Potassium this morning was 5.6. -Kayexalate 15 g p.o. every 8 hourly x 2 doses, and repeat renal panel afterwards. -IV Lasix. 04/23/2018: Resolved.  Chronic Back Pain  -Continue to optimize. -Reviewed pain medications.  Hx of Breast CA s/p Bilateral mastectomies + chemo -Will need outpatient follow up with PCP   Hypothyroidism -Continue levothyroxine.  History of Seizures/Seizure Disorder and migraines -Continue Divalproex 1000 mg p.o. daily -Has not had a Seizure in 10 years   Hard of Hearing/Ear Congestion -Continue with Debrox -Complete course of Ciprodex otic.   Dyslipidemia -C/w with Atorvastatin 80 mg po qHS  Insomnia -Continue with Melatonin 3 mg po qHS  Generalized Anxiety Disorder -Continue with BuSpirone 15 mg po BID   GERD -C/w Pantoprazole 40 mg po BID   DVT prophylaxis: SCDs  Code Status: FULL CODE  Family Communication: No family present at bedside  Disposition Plan: SNF   Consultants:   Discussed the Case with ID Dr. Megan Salon  Cardiology Dr. Stanford Breed   Procedures:  None  Antimicrobials:  Anti-infectives (From admission, onward)   Start     Dose/Rate Route Frequency Ordered Stop   04/12/18 0600  piperacillin-tazobactam (ZOSYN) IVPB 3.375 g     3.375 g 12.5 mL/hr over 240 Minutes Intravenous Every 8 hours 04/12/18 0548 04/17/18 1251  Subjective: Patient seen. No new complaints. Patient is slowly improving.  Objective: Vitals:   04/23/18 0527 04/23/18 0541 04/23/18 0909 04/23/18 0911  BP: (!) 177/93     Pulse: (!) 103  96   Resp: (!) 22  20   Temp: 97.7 F (36.5 C)     TempSrc: Oral     SpO2: 94%  98% 98%  Weight:  72.7 kg    Height:        Intake/Output Summary (Last 24 hours) at 04/23/2018 1122 Last data filed at 04/23/2018 0830 Gross per 24 hour  Intake 1267.42 ml  Output 1275 ml  Net -7.58 ml   Filed Weights   04/21/18 0342  04/22/18 0345 04/23/18 0541  Weight: 72.6 kg 72.2 kg 72.7 kg   Examination: Physical Exam:  Constitutional: Not in any distress.  Patient is awake and alert. Eyes: Mild pallor. Neck: Supple no JVD Respiratory: Diminished air entry.  Mild wheezing.   Cardiovascular: S1-S2. Abdomen: Soft, nontender, distended slightly secondary body habitus.  Bowel sounds present GU: Deferred Extremities: 1+ to 2 bilateral leg edema  Neurologic: Awake and alert.  Patient moves all extremities.  Data Reviewed: I have personally reviewed following labs and imaging studies  CBC: Recent Labs  Lab 04/17/18 0202  04/18/18 1516 04/19/18 0219 04/20/18 0641 04/21/18 0448 04/22/18 0520  WBC 5.0   < > 4.5 4.4 5.3 4.2 7.9  NEUTROABS 2.3  --   --   --  2.5 3.3 6.8  HGB 8.0*   < > 8.1* 8.2* 8.4* 9.1* 9.9*  HCT 23.8*   < > 24.7* 24.2* 27.3* 28.5* 29.6*  MCV 89.5   < > 88.2 88.6 89.8 89.9 90.0  PLT 210   < > 183 195 226 304 426*   < > = values in this interval not displayed.   Basic Metabolic Panel: Recent Labs  Lab 04/19/18 0219 04/20/18 0641 04/21/18 0626 04/21/18 0756  04/22/18 0520  04/22/18 1219 04/22/18 1603 04/22/18 1931 04/22/18 2330 04/23/18 0403  NA 138 134* 132* 134*  --  132*  --   --   --  137  --  137  K 4.2 5.4* 6.4* 6.5*   < > 5.6*   < > 5.5* 5.3* 5.2*   5.2* 4.9 4.8  CL 100 99 96* 94*  --  95*  --   --   --  98  --  100  CO2 29 28 28 27   --  27  --   --   --  31  --  29  GLUCOSE 102* 113* 176* 135*  --  160*  --   --   --  164*  --  163*  BUN 44* 46* 49* 49*  --  50*  --   --   --  51*  --  54*  CREATININE 1.76* 1.67* 1.92* 1.96*  --  1.90*  --   --   --  1.95*  --  1.90*  CALCIUM 8.0* 8.1* 8.6* 9.0  --  8.6*  --   --   --  8.5*  --  8.4*  MG 2.3 2.1 2.1  --   --  2.0  --   --   --   --   --  1.9  PHOS  --  3.2 3.5  --   --  4.0  --   --   --  4.0  --  4.1   < > = values in  this interval not displayed.   GFR: Estimated Creatinine Clearance: 28.1 mL/min (A) (by C-G formula  based on SCr of 1.9 mg/dL (H)). Liver Function Tests: Recent Labs  Lab 04/18/18 0225 04/20/18 0641 04/21/18 0626 04/22/18 0520 04/22/18 1931 04/23/18 0403  AST 23 26 25 31   --   --   ALT 11 12 15 14   --   --   ALKPHOS 36* 45 49 51  --   --   BILITOT 0.5 0.4 0.7 0.7  --   --   PROT 4.8* 5.2* 6.2* 6.1*  --   --   ALBUMIN 1.6* 2.0* 2.2* 2.2* 2.6* 3.0*   No results for input(s): LIPASE, AMYLASE in the last 168 hours. No results for input(s): AMMONIA in the last 168 hours. Coagulation Profile: No results for input(s): INR, PROTIME in the last 168 hours. Cardiac Enzymes: No results for input(s): CKTOTAL, CKMB, CKMBINDEX, TROPONINI in the last 168 hours. BNP (last 3 results) No results for input(s): PROBNP in the last 8760 hours. HbA1C: No results for input(s): HGBA1C in the last 72 hours. CBG: Recent Labs  Lab 04/22/18 0614 04/22/18 1200 04/22/18 1613 04/22/18 2129 04/23/18 0648  GLUCAP 158* 129* 151* 134* 156*   Lipid Profile: No results for input(s): CHOL, HDL, LDLCALC, TRIG, CHOLHDL, LDLDIRECT in the last 72 hours. Thyroid Function Tests: No results for input(s): TSH, T4TOTAL, FREET4, T3FREE, THYROIDAB in the last 72 hours. Anemia Panel: No results for input(s): VITAMINB12, FOLATE, FERRITIN, TIBC, IRON, RETICCTPCT in the last 72 hours. Sepsis Labs: Recent Labs  Lab 04/22/18 0655  PROCALCITON <0.10    Recent Results (from the past 240 hour(s))  Expectorated sputum assessment w rflx to resp cult     Status: None   Collection Time: 04/14/18  6:30 AM  Result Value Ref Range Status   Specimen Description SPUTUM  Final   Special Requests Normal  Final   Sputum evaluation   Final    THIS SPECIMEN IS ACCEPTABLE FOR SPUTUM CULTURE Performed at [facility], [address], [place], [place] [address]    Report Status 04/14/2018 FINAL  Final  Culture, respiratory     Status: None   Collection Time: 04/14/18  6:30 AM  Result Value Ref Range Status   Specimen  Description SPUTUM  Final   Special Requests Normal Reflexed from [id number]  Final   Gram Stain   Final    MODERATE WBC PRESENT,BOTH PMN AND MONONUCLEAR ABUNDANT YEAST WITH PSEUDOHYPHAE Performed at [facility], [place] [address], [place], [place] [address]    Culture   Final    FEW CANDIDA GLABRATA FEW METHICILLIN RESISTANT STAPHYLOCOCCUS AUREUS    Report Status 04/18/2018 FINAL  Final   Organism ID, Bacteria METHICILLIN RESISTANT STAPHYLOCOCCUS AUREUS  Final      Susceptibility   Methicillin resistant staphylococcus aureus - MIC*    CIPROFLOXACIN >=8 RESISTANT Resistant     ERYTHROMYCIN >=8 RESISTANT Resistant     GENTAMICIN <=0.5 SENSITIVE Sensitive     OXACILLIN >=4 RESISTANT Resistant     TETRACYCLINE <=1 SENSITIVE Sensitive     VANCOMYCIN 1 SENSITIVE Sensitive     TRIMETH/SULFA <=10 SENSITIVE Sensitive     CLINDAMYCIN <=0.25 SENSITIVE Sensitive     RIFAMPIN <=0.5 SENSITIVE Sensitive     Inducible Clindamycin NEGATIVE Sensitive     * FEW METHICILLIN RESISTANT STAPHYLOCOCCUS AUREUS     Estimated body mass index is 27.52 kg/m as calculated from the following:   Height as  of this encounter: 5\' 4"  (1.626 m).   Weight as of this encounter: 72.7 kg.  Malnutrition Type:  Nutrition Problem: Inadequate oral intake Etiology: decreased appetite   Malnutrition Characteristics:  Signs/Symptoms: per patient/family report, meal completion < 50%   Nutrition Interventions:  Interventions: Glucerna shake   Radiology Studies: Ct Chest Wo Contrast  Result Date: 04/22/2018 CLINICAL DATA:  68 year old female with history of acute hypoxic respiratory failure. RSV pneumonia. Pulmonary edema. History of congestive heart failure. EXAM: CT CHEST WITHOUT CONTRAST TECHNIQUE: Multidetector CT imaging of the chest was performed following the standard protocol without IV contrast. COMPARISON:  Chest CT 01/17/2018. FINDINGS: Cardiovascular: Heart size is mildly enlarged. There is no significant  pericardial fluid, thickening or pericardial calcification. There is aortic atherosclerosis, as well as atherosclerosis of the great vessels of the mediastinum and the coronary arteries, including calcified atherosclerotic plaque in the left anterior descending and right coronary arteries. Coronary artery stent in the proximal left anterior descending coronary artery. Mediastinum/Nodes: No pathologically enlarged mediastinal or hilar lymph nodes. Please note that accurate exclusion of hilar adenopathy is limited on noncontrast CT scans. Esophagus is unremarkable in appearance. No axillary lymphadenopathy. Lungs/Pleura: Widespread areas of ground-glass attenuation are noted throughout the lungs bilaterally, relatively symmetric and predominantly peribronchovascular in distribution. This is associated with extensive interlobular septal thickening. A few areas of more nodular ground-glass attenuation are also noted throughout the periphery of the lungs. Dependent areas of subsegmental atelectasis are noted in the lower lobes of the lungs bilaterally. Moderate bilateral pleural effusions lying dependently. Upper Abdomen: Aortic atherosclerosis.  Status post cholecystectomy. Musculoskeletal: Chronic appearing compression fractures of T7 and T8, most severe at T8 where there is 20% loss of anterior vertebral body height. There are no aggressive appearing lytic or blastic lesions noted in the visualized portions of the skeleton. IMPRESSION: 1. Appearance of the thorax is most suggestive of congestive heart failure, as detailed above. 2. Some of the pulmonary findings could also be associated with atypical infection, including viral pneumonia, in this patient with documented history of RSV. 3. Aortic atherosclerosis, in addition to 2 vessel coronary artery disease. Please note that although the presence of coronary artery calcium documents the presence of coronary artery disease, the severity of this disease and any  potential stenosis cannot be assessed on this non-gated CT examination. Assessment for potential risk factor modification, dietary therapy or pharmacologic therapy may be warranted, if clinically indicated. Aortic Atherosclerosis (ICD10-I70.0). Electronically Signed   By: Vinnie Langton M.D.   On: 04/22/2018 09:54   Dg Chest Port 1 View  Result Date: 04/22/2018 CLINICAL DATA:  Shortness of breath EXAM: PORTABLE CHEST 1 VIEW COMPARISON:  04/21/2018 FINDINGS: Heart is borderline enlarged. Bilateral perihilar and lower lobe airspace opacities with diffuse interstitial prominence. Small left effusion. Findings likely reflect edema/CHF. No acute bony abnormality. No change. IMPRESSION: No significant change in CHF pattern since prior study. Electronically Signed   By: Rolm Baptise M.D.   On: 04/22/2018 07:53   Scheduled Meds:  amLODipine  5 mg Oral Daily   arformoterol  15 mcg Nebulization BID   atorvastatin  80 mg Oral QHS   budesonide (PULMICORT) nebulizer solution  0.5 mg Nebulization BID   busPIRone  15 mg Oral BID   carbamide peroxide  5 drop Right EAR BID   carvedilol  25 mg Oral BID WC   divalproex  1,000 mg Oral Daily   furosemide  40 mg Intravenous BID   guaiFENesin  1,200 mg Oral BID  hydrALAZINE  100 mg Oral Q8H   insulin aspart  0-5 Units Subcutaneous QHS   insulin aspart  0-9 Units Subcutaneous TID WC   isosorbide dinitrate  20 mg Oral TID   levothyroxine  200 mcg Oral Daily   Melatonin  3 mg Oral QHS   methylPREDNISolone (SOLU-MEDROL) injection  40 mg Intravenous Q12H   morphine  15 mg Oral BID   multivitamin with minerals  1 tablet Oral Daily   pantoprazole  40 mg Oral Daily   sodium chloride flush  3 mL Intravenous Q12H   ticagrelor  90 mg Oral BID   Continuous Infusions:  albumin human 25 g (04/23/18 0536)     LOS: 12 days   Bonnell Public, DO [facility] PAGER is on AMION  If 7PM-7AM, please contact  night-coverage [email or web address] Password [email or web address] 04/23/2018, 11:22 AM

## 2018-04-23 NOTE — Progress Notes (Signed)
Pt continues with chest pain due to excessive coughing. Pt now producing thick tan sputum. PRN Tessalon, Tussionex, and pain meds given during shift. Will continue to monitor.

## 2018-04-23 NOTE — Progress Notes (Signed)
Patient has been asking for dilaudid for her "breathing" all day, RN explains what the medication is for and re-educates. Patient constantly asks for different drugs displaying seeking behavior. Patient says dilaudid will help her pain because nothing has helped all day and she received dilaudid last night with relief. RN finally gave dilaudid for pain & patient states she has a problem with addiction. Please refrain from IV pain medication and give PRN oxycodone orally.

## 2018-04-23 NOTE — Plan of Care (Signed)
  Problem: Education: Goal: Knowledge of General Education information will improve Description Including pain rating scale, medication(s)/side effects and non-pharmacologic comfort measures Outcome: Progressing   Problem: Health Behavior/Discharge Planning: Goal: Ability to manage health-related needs will improve Outcome: Progressing   

## 2018-04-24 LAB — CBC WITH DIFFERENTIAL/PLATELET
Abs Immature Granulocytes: 0.02 10*3/uL (ref 0.00–0.07)
Basophils Absolute: 0 10*3/uL (ref 0.0–0.1)
Basophils Relative: 0 %
Eosinophils Absolute: 0 10*3/uL (ref 0.0–0.5)
Eosinophils Relative: 0 %
HCT: 24.2 % — ABNORMAL LOW (ref 36.0–46.0)
Hemoglobin: 7.4 g/dL — ABNORMAL LOW (ref 12.0–15.0)
Immature Granulocytes: 1 %
Lymphocytes Relative: 15 %
Lymphs Abs: 0.5 10*3/uL — ABNORMAL LOW (ref 0.7–4.0)
MCH: 28.5 pg (ref 26.0–34.0)
MCHC: 30.6 g/dL (ref 30.0–36.0)
MCV: 93.1 fL (ref 80.0–100.0)
Monocytes Absolute: 0.2 10*3/uL (ref 0.1–1.0)
Monocytes Relative: 5 %
Neutro Abs: 2.7 10*3/uL (ref 1.7–7.7)
Neutrophils Relative %: 79 %
Platelets: 368 10*3/uL (ref 150–400)
RBC: 2.6 MIL/uL — ABNORMAL LOW (ref 3.87–5.11)
RDW: 14.1 % (ref 11.5–15.5)
WBC: 3.4 10*3/uL — ABNORMAL LOW (ref 4.0–10.5)
nRBC: 0 % (ref 0.0–0.2)

## 2018-04-24 LAB — RENAL FUNCTION PANEL
Albumin: 3.8 g/dL (ref 3.5–5.0)
Anion gap: 16 — ABNORMAL HIGH (ref 5–15)
BUN: 58 mg/dL — ABNORMAL HIGH (ref 8–23)
CO2: 29 mmol/L (ref 22–32)
Calcium: 8.8 mg/dL — ABNORMAL LOW (ref 8.9–10.3)
Chloride: 92 mmol/L — ABNORMAL LOW (ref 98–111)
Creatinine, Ser: 2.07 mg/dL — ABNORMAL HIGH (ref 0.44–1.00)
GFR calc Af Amer: 28 mL/min — ABNORMAL LOW (ref >60)
GFR calc non Af Amer: 24 mL/min — ABNORMAL LOW (ref >60)
Glucose, Bld: 164 mg/dL — ABNORMAL HIGH (ref 70–99)
Phosphorus: 4.8 mg/dL — ABNORMAL HIGH (ref 2.5–4.6)
Potassium: 4.1 mmol/L (ref 3.5–5.1)
Sodium: 137 mmol/L (ref 135–145)

## 2018-04-24 LAB — GLUCOSE, CAPILLARY
Glucose-Capillary: 172 mg/dL — ABNORMAL HIGH (ref 70–99)
Glucose-Capillary: 185 mg/dL — ABNORMAL HIGH (ref 70–99)
Glucose-Capillary: 203 mg/dL — ABNORMAL HIGH (ref 70–99)
Glucose-Capillary: 192 mg/dL — ABNORMAL HIGH (ref 70–99)

## 2018-04-24 LAB — MAGNESIUM: Magnesium: 2.1 mg/dL (ref 1.7–2.4)

## 2018-04-24 LAB — PREPARE RBC (CROSSMATCH)

## 2018-04-24 MED ORDER — SODIUM CHLORIDE 0.9% IV SOLUTION
Freq: Once | INTRAVENOUS | Status: AC
  Administered 2018-04-24: 09:00:00 via INTRAVENOUS

## 2018-04-24 MED ORDER — FUROSEMIDE 10 MG/ML IJ SOLN
40.0000 mg | Freq: Two times a day (BID) | INTRAMUSCULAR | Status: DC
  Administered 2018-04-24 – 2018-04-25 (×2): 40 mg via INTRAVENOUS
  Filled 2018-04-24 (×2): qty 4

## 2018-04-24 NOTE — Consult Note (Signed)
Reason for Consult:vulvar lesion bleeding Referring Physician: Georgette Shell, MD  Andrea [name] is an 68 y.o. female. G2P2, s/p hysterectomy at age 61 for pelvic cysts. Hospitalized for CHF, noted to have a bleeding lesion on the vulva this morning. She had some itching and she may have scratched the area but not sure. No pain noted  Pertinent Gynecological History: Menses: post-menopausal and s/p hysterectomy age 28 Previous GYN Procedures: hysterectomy  Last mammogram: abnormal: she has had bilateral mastectomy Last pap: normal Date: 2014 OB History: G2, P2   Menstrual History:No LMP recorded. Patient has had a hysterectomy.    Past Medical History:  Diagnosis Date  . Breast cancer ([place])    bilat mastectomy and LUE lymphadenectomy 2014, chemoRx 2014 - 15  . CHF (congestive heart failure) ([place])   . Diabetes mellitus without complication ([place])   . Esophageal reflux   . Hypertension   . Hypothyroid   . Migraine   . Seizure [facility])     Past Surgical History:  Procedure Laterality Date  . ABDOMINAL HYSTERECTOMY     1984 , done for ruptured cyst and endometriosis  . APPENDECTOMY    . CHOLECYSTECTOMY     1980's  . CORONARY STENT INTERVENTION N/A 12/30/2017   Procedure: CORONARY STENT INTERVENTION;  Surgeon: Martinique, Peter M, MD;  Location: [facility];  Service: Cardiovascular;  Laterality: N/A;  . EYE SURGERY  08/28/2015   Right eye  . KNEE SURGERY Left   . MASTECTOMY     bilateral  . Open surgery for bowel obstruction, 2000's    . RIGHT/LEFT HEART CATH AND CORONARY ANGIOGRAPHY N/A 12/29/2017   Procedure: RIGHT/LEFT HEART CATH AND CORONARY ANGIOGRAPHY;  Surgeon: Martinique, Peter M, MD;  Location: [facility];  Service: Cardiovascular;  Laterality: N/A;    Family History  Problem Relation Age of Onset  . Sudden Cardiac Death Neg Hx     Social History:  reports that she has never smoked. She has never used smokeless tobacco. She reports that she does  not drink alcohol or use drugs.  Allergies:  Allergies  Allergen Reactions  . Ticagrelor Rash  . Aspartame And Phenylalanine Hives and Diarrhea  . Aspirin Nausea And Vomiting  . Maxzide [Hydrochlorothiazide W-Triamterene] Swelling    Fluid retention  . Metformin And Related Diarrhea  . Nsaids Nausea And Vomiting  . Other Other (See Comments)    All steroids produce psychosis per pt Artificial sweeteners produce nausea and upset stomach.  Gregary Cromer [Pravastatin] Other (See Comments)    unknown  . Spironolactone   . Stadol [Butorphanol] Other (See Comments)    Toradol, etc And related- hallucinations  . Toradol [Ketorolac Tromethamine] Other (See Comments)    hallucinations  . Vistaril [Hydroxyzine Hcl] Other (See Comments)    unknown  . Erythromycin Itching and Rash  . Morphine And Related Hives and Rash    Iv morphine.    Medications: I have reviewed the patient's current medications.  Review of Systems  Constitutional: Positive for malaise/fatigue.  Cardiovascular: Negative.   Genitourinary: Negative for dysuria and hematuria.       Blood at vulva this morning    Blood pressure (!) 145/67, pulse 88, temperature (!) 97.3 F (36.3 C), temperature source Oral, resp. rate 14, height 5\' 4"  (1.626 m), weight 71 kg, SpO2 90 %. Physical Exam  Vitals reviewed. Constitutional: She appears well-developed. No distress.  Respiratory: Effort normal.  Genitourinary:    No vaginal discharge.  Genitourinary Comments: Vulva, external genitalia mildly atrophic, spots of red blood on pad but no active bleeding, nothing per vagina, possible tiny hemangiomas on vulva, no excoriation or rash     Results for orders placed or performed during the hospital encounter of 04/11/18 (from the past 48 hour(s))  Glucose, capillary     Status: Abnormal   Collection Time: 04/22/18 12:00 PM  Result Value Ref Range   Glucose-Capillary 129 (H) 70 - 99 mg/dL   Comment 1 Notify RN    Comment 2  Document in Chart   Potassium     Status: Abnormal   Collection Time: 04/22/18 12:19 PM  Result Value Ref Range   Potassium 5.5 (H) 3.5 - 5.1 mmol/L    Comment: Performed at [facility], [address] [place], [place] [address]  Potassium     Status: Abnormal   Collection Time: 04/22/18  4:03 PM  Result Value Ref Range   Potassium 5.3 (H) 3.5 - 5.1 mmol/L    Comment: Performed at [facility] [address] [place], [place] [address]  Glucose, capillary     Status: Abnormal   Collection Time: 04/22/18  4:13 PM  Result Value Ref Range   Glucose-Capillary 151 (H) 70 - 99 mg/dL   Comment 1 Notify RN    Comment 2 Document in Chart   Potassium     Status: Abnormal   Collection Time: 04/22/18  7:31 PM  Result Value Ref Range   Potassium 5.2 (H) 3.5 - 5.1 mmol/L    Comment: Performed at [facility], [place] [address] [place], [place] [address]  Renal function panel     Status: Abnormal   Collection Time: 04/22/18  7:31 PM  Result Value Ref Range   Sodium 137 135 - 145 mmol/L   Potassium 5.2 (H) 3.5 - 5.1 mmol/L   Chloride 98 98 - 111 mmol/L   CO2 31 22 - 32 mmol/L   Glucose, Bld 164 (H) 70 - 99 mg/dL   BUN 51 (H) 8 - 23 mg/dL   Creatinine, Ser 1.95 (H) 0.44 - 1.00 mg/dL   Calcium 8.5 (L) 8.9 - 10.3 mg/dL   Phosphorus 4.0 2.5 - 4.6 mg/dL   Albumin 2.6 (L) 3.5 - 5.0 g/dL   GFR calc non Af Amer 26 (L) >60 mL/min   GFR calc Af Amer 30 (L) >60 mL/min   Anion gap 8 5 - 15    Comment: Performed at [facility] [address] [place], [place] [address]  Glucose, capillary     Status: Abnormal   Collection Time: 04/22/18  9:29 PM  Result Value Ref Range   Glucose-Capillary 134 (H) 70 - 99 mg/dL  Potassium     Status: None   Collection Time: 04/22/18 11:30 PM  Result Value Ref Range   Potassium 4.9 3.5 - 5.1 mmol/L    Comment: Performed at [facility], [place] [address] [place], [place] [address]  Renal function panel     Status: Abnormal    Collection Time: 04/23/18  4:03 AM  Result Value Ref Range   Sodium 137 135 - 145 mmol/L   Potassium 4.8 3.5 - 5.1 mmol/L   Chloride 100 98 - 111 mmol/L   CO2 29 22 - 32 mmol/L   Glucose, Bld 163 (H) 70 - 99 mg/dL   BUN 54 (H) 8 - 23 mg/dL   Creatinine, Ser 1.90 (H) 0.44 - 1.00 mg/dL  Calcium 8.4 (L) 8.9 - 10.3 mg/dL   Phosphorus 4.1 2.5 - 4.6 mg/dL   Albumin 3.0 (L) 3.5 - 5.0 g/dL   GFR calc non Af Amer 27 (L) >60 mL/min   GFR calc Af Amer 31 (L) >60 mL/min   Anion gap 8 5 - 15    Comment: Performed at [facility] [address], [place], [place] [address]  Magnesium     Status: None   Collection Time: 04/23/18  4:03 AM  Result Value Ref Range   Magnesium 1.9 1.7 - 2.4 mg/dL    Comment: Performed at [facility] [address] [place], [place] [address]  Glucose, capillary     Status: Abnormal   Collection Time: 04/23/18  6:48 AM  Result Value Ref Range   Glucose-Capillary 156 (H) 70 - 99 mg/dL  Glucose, capillary     Status: Abnormal   Collection Time: 04/23/18 11:34 AM  Result Value Ref Range   Glucose-Capillary 145 (H) 70 - 99 mg/dL  Glucose, capillary     Status: Abnormal   Collection Time: 04/23/18  4:18 PM  Result Value Ref Range   Glucose-Capillary 144 (H) 70 - 99 mg/dL  Glucose, capillary     Status: Abnormal   Collection Time: 04/23/18  9:09 PM  Result Value Ref Range   Glucose-Capillary 158 (H) 70 - 99 mg/dL   Comment 1 Notify RN    Comment 2 Document in Chart   Magnesium     Status: None   Collection Time: 04/24/18  3:28 AM  Result Value Ref Range   Magnesium 2.1 1.7 - 2.4 mg/dL    Comment: Performed at [facility], [place] [address], [place], [place] [address]  CBC with Differential/Platelet     Status: Abnormal   Collection Time: 04/24/18  3:28 AM  Result Value Ref Range   WBC 3.4 (L) 4.0 - 10.5 K/uL   RBC 2.60 (L) 3.87 - 5.11 MIL/uL   Hemoglobin 7.4 (L) 12.0 - 15.0 g/dL    Comment: REPEATED TO VERIFY   HCT 24.2 (L) 36.0 - 46.0 %    MCV 93.1 80.0 - 100.0 fL   MCH 28.5 26.0 - 34.0 pg   MCHC 30.6 30.0 - 36.0 g/dL   RDW 14.1 11.5 - 15.5 %   Platelets 368 150 - 400 K/uL   nRBC 0.0 0.0 - 0.2 %   Neutrophils Relative % 79 %   Neutro Abs 2.7 1.7 - 7.7 K/uL   Lymphocytes Relative 15 %   Lymphs Abs 0.5 (L) 0.7 - 4.0 K/uL   Monocytes Relative 5 %   Monocytes Absolute 0.2 0.1 - 1.0 K/uL   Eosinophils Relative 0 %   Eosinophils Absolute 0.0 0.0 - 0.5 K/uL   Basophils Relative 0 %   Basophils Absolute 0.0 0.0 - 0.1 K/uL   Immature Granulocytes 1 %   Abs Immature Granulocytes 0.02 0.00 - 0.07 K/uL    Comment: Performed at [facility], [place] [address] [place],  [address]  Renal function panel     Status: Abnormal   Collection Time: 04/24/18  3:28 AM  Result Value Ref Range   Sodium 137 135 - 145 mmol/L   Potassium 4.1 3.5 - 5.1 mmol/L   Chloride 92 (L) 98 - 111 mmol/L   CO2 29 22 - 32 mmol/L   Glucose, Bld 164 (H) 70 - 99 mg/dL   BUN 58 (H) 8 - 23 mg/dL  Creatinine, Ser 2.07 (H) 0.44 - 1.00 mg/dL   Calcium 8.8 (L) 8.9 - 10.3 mg/dL   Phosphorus 4.8 (H) 2.5 - 4.6 mg/dL   Albumin 3.8 3.5 - 5.0 g/dL   GFR calc non Af Amer 24 (L) >60 mL/min   GFR calc Af Amer 28 (L) >60 mL/min   Anion gap 16 (H) 5 - 15    Comment: Performed at [facility] [address] [place], [place] [address]  Glucose, capillary     Status: Abnormal   Collection Time: 04/24/18  5:41 AM  Result Value Ref Range   Glucose-Capillary 172 (H) 70 - 99 mg/dL  Type and screen [place]     Status: None (Preliminary result)   Collection Time: 04/24/18 11:12 AM  Result Value Ref Range   ABO/RH(D) PENDING    Antibody Screen PENDING    Sample Expiration      04/27/2018 Performed at [facility], [place] [address] [place], [place] [address]   Prepare RBC     Status: None (Preliminary result)   Collection Time: 04/24/18 11:12 AM  Result Value Ref Range   Order Confirmation      ORDER PROCESSED BY BLOOD  BANK Performed at [facility], [place] [address] [place], [place] [address]   Glucose, capillary     Status: Abnormal   Collection Time: 04/24/18 11:49 AM  Result Value Ref Range   Glucose-Capillary 185 (H) 70 - 99 mg/dL   Comment 1 Notify RN     No results found.  Assessment/Plan: She may have caused a hemangioma to bleed inadvertently but no bleeding now, no area that needs biopsy. I gave he reassurance. Thanks for the consult, call [phone] with questions.  Emeterio Reeve 04/24/2018

## 2018-04-24 NOTE — Progress Notes (Signed)
Physical Therapy Treatment Patient Details Name: Andrea [name] MRN: [id number] DOB: 1950/03/19 Today's Date: 04/24/2018    History of Present Illness 89[facility], 3VCAD, ischemiccardiomyopathypresented via EMS with resp distress, hypoxia tachypnea, rales, "fluid on her". Treated with BiPAP and now weaned to [facility].    PT Comments    Co-treat performed due to high levels of fatigue this afternoon. Patient received in bed, willing to participate in session and able to complete functional bed mobility and transfers with S and extended time. Able to improve gait distance this afternoon and gait train approximately 351f with S with RW, intermittent standing rest breaks. VSS and SpO2 95-96% during activity on 2LPM. Able to release RW to hold spit tub for respiratory secretions however did require MinA while doing so due to impaired balance. She was left in bed with all needs met this afternoon.      Follow Up Recommendations  SNF;Supervision/Assistance - 24 hour     Equipment Recommendations  Other (comment)(TBD)    Recommendations for Other Services       Precautions / Restrictions Precautions Precautions: Fall Precaution Comments: watch 02 Restrictions Weight Bearing Restrictions: No    Mobility  Bed Mobility Overal bed mobility: Needs Assistance Bed Mobility: Supine to Sit     Supine to sit: Supervision Sit to supine: Supervision   General bed mobility comments: Increased time and effort  Transfers Overall transfer level: Needs assistance Equipment used: Rolling walker (2 wheeled) Transfers: Sit to/from Stand Sit to Stand: Supervision         General transfer comment: S for safety, no physical assist given, extended time   Ambulation/Gait Ambulation/Gait assistance: Supervision Gait Distance (Feet): 350 Feet Assistive device: Rolling walker (2 wheeled) Gait Pattern/deviations: Step-through pattern;Decreased stride length;Trunk  flexed;Drifts right/left Gait velocity: Decreased    General Gait Details: slow gait pattern with RW, intermittent standing rest breaks and tendency to drift to R with RW. VSS and SPO2 95% on 2LPM during gait    Stairs             Wheelchair Mobility    Modified Rankin (Stroke Patients Only)       Balance Overall balance assessment: Needs assistance Sitting-balance support: No upper extremity supported;Feet supported Sitting balance-Leahy Scale: Good     Standing balance support: No upper extremity supported;During functional activity Standing balance-Leahy Scale: Fair Standing balance comment: minA to maintain balance when patient using both hands to hold spitoon                             Cognition Arousal/Alertness: Awake/alert Behavior During Therapy: Flat affect Overall Cognitive Status: Within Functional Limits for tasks assessed Area of Impairment: Safety/judgement;Awareness                   Current Attention Level: Selective   Following Commands: Follows one step commands with increased time;Follows one step commands consistently Safety/Judgement: Decreased awareness of safety;Decreased awareness of deficits Awareness: Emergent Problem Solving: Decreased initiation;Slow processing;Requires verbal cues        Exercises      General Comments        Pertinent Vitals/Pain Pain Assessment: Faces Pain Score: 0-No pain Faces Pain Scale: No hurt Pain Intervention(s): Monitored during session;Limited activity within patient's tolerance    Home Living                      Prior Function  PT Goals (current goals can now be found in the care plan section) Acute Rehab PT Goals Patient Stated Goal: to feel better PT Goal Formulation: With patient Time For Goal Achievement: 04/26/18 Potential to Achieve Goals: Good Progress towards PT goals: Progressing toward goals    Frequency    Min 2X/week      PT  Plan Current plan remains appropriate    Co-evaluation PT/OT/SLP Co-Evaluation/Treatment: Yes Reason for Co-Treatment: Complexity of the patient's impairments (multi-system involvement)          AM-PAC PT "6 Clicks" Mobility   Outcome Measure  Help needed turning from your back to your side while in a flat bed without using bedrails?: None Help needed moving from lying on your back to sitting on the side of a flat bed without using bedrails?: None Help needed moving to and from a bed to a chair (including a wheelchair)?: None Help needed standing up from a chair using your arms (e.g., wheelchair or bedside chair)?: A Little Help needed to walk in hospital room?: A Little Help needed climbing 3-5 steps with a railing? : A Little 6 Click Score: 21    End of Session Equipment Utilized During Treatment: Oxygen Activity Tolerance: Patient limited by fatigue Patient left: in bed;with call bell/phone within reach   PT Visit Diagnosis: Muscle weakness (generalized) (M62.81);Unsteadiness on feet (R26.81)     Time: 9983-3825 PT Time Calculation (min) (ACUTE ONLY): 41 min  Charges:  $Gait Training: 23-37 mins                     Deniece Ree PT, DPT, CBIS  Supplemental Physical Therapist [place]    Pager [phone] Acute Rehab Office [phone]

## 2018-04-24 NOTE — Progress Notes (Addendum)
PROGRESS NOTE    Andrea [name]  BMW:[id number] DOB: [birth year] DOA: 04/11/2018 PCP: Medicine, [facility]  Brief Narrative:68 year old female 68yo from [facility] with past medical history significant for ischemic cardiomyopathy andCHFw/ recent improved EF, 3V CADs/p PCI of LAD Dec 2019 / not a CABG candidate,HTN, Seizure disorder,DM, Hx of Breast Cancer and other comorbitieswho presented with resp distresswith sudden worsening of SOB, chest pressure, and leg edema. She was found to be RSV positive, and required noninvasive mechanical ventilation (BiPAP) and diuresis. She was also recently hospitalized from2/23 - 3/6 for CHF, AKI, and confusion from narcotics.    04/22/2018: Cardiac BNP remains elevated.  Hyperkalemia persists.  CT chest without contrast was suggestive of congestive heart failure, however, atypical pneumonia could not be excluded.  Procalcitonin is less than 0.1.  Patient is off antibiotics.  Albumin is 2.2.  Volume overload persists.  Patient has significant chronic kidney disease, with serum creatinine of 1.9.  Will increase the dose of IV Lasix to 80 mg twice daily.  Will optimize volume.  Low threshold to administer albumin (though controversial, may enhance diuretic function).  Overall, patient seems to be improving, not on 2 L of supplemental oxygen.  Will manage hyperkalemia, will monitor renal function and electrolytes closely.  No fever or chills.  04/23/2018: Patient seen.  Significant peripheral edema persists.  As per documentation, patient is only 7.6 cc negative over the last 24 hours.  Serum creatinine is stable at 1.9.  Hyperkalemia has resolved.  Admission with on 04/19/2018 was 70 kg, and last documented weight was 72.7 kg.  We will continue IV Lasix at 80 mg twice daily (considering his significant edema, tendency towards hyperkalemia, significant renal impairment with serum creatinine of 1.9, and excess of 2.7 kg in weight as at last  measurement compared to admission weight).  Continue to monitor input and output strictly.  Monitor renal function and electrolytes as well.  Overall, patient seems to be improving slowly.  Assessment & Plan:   Principal Problem:   Acute hypoxemic respiratory failure (HCC) Active Problems:   Chronic pain   Chest pain   Acute respiratory failure with hypoxia (HCC)   Anemia   Acute on chronic diastolic CHF (congestive heart failure) (HCC)   RSV (respiratory syncytial virus pneumonia)   DM type 2 (diabetes mellitus, type 2) (HCC)   CKD (chronic kidney disease), stage IV (HCC)   Acute Hypoxic Respiratory Failure - RSV pneumonia+ pulmonary edemain the setting of Acute on Chronic Diastolic CHF and chronic kidney disease stage III with probable mild AKI: -Presented with hypoxia, tachycardia, tachypnea placed on BiPAP; Now weaned off of BiPAP and is on 2 Liters  -Chest x-ray done on admission which revealed bilateral pulmonary infiltrates suggestive of multilobar pneumonia -Respiratory panel positive for RSV -Completed 5 days of IV Zosyn  -Blood cultures x2 04/11/2018 showed NGTD at 5 Dasy -Sputum Gram Stain showed Moderate WBC Present, Both PMN and Mononuclear with Abudant Yeast with Psuedohyphae and Cx showed Few Candida Glabrata and Few MRSA which was Resistant to Ciprofloxacin, Erythromycin, and Oxacillin; ? if she is colonized with MRSA;  -As per prior documentation, infectious disease team is of the impression that the Candida glabrata is insignificant and the MRSA is likely a colonization as she has been afebrile with no leukocytosis and no worsening hypoxemic symptoms -Patient is requiring only 2 L of supplemental oxygen via nasal cannula.   -Continuous pulse oximetry and maintain O2 saturation greater than 90% -CT scan of the  chest done without contrast on 04/22/2018 is suggestive of likely congestive heart failure, however, atypical infection cannot be ruled out. -Procalcitonin is less  than 0.1. -Continue IV diuretics MAY NEED TO DECREASE DOSE WITH JUMP IN CREATININE .DEFER TO CARDS...THOUGH STILL POSITIVE BY 3.4 LIT.   Acute on Chronic Diastolic CHF  -Hx ofrecoveredSystolic CHF Dec 0093 (EF 30-35% TTE) -EF 03/05/18 noted EF 55-60% w/ mild LVH, normal RV fxn, and no valvular issues     -Cardiology input is highly appreciated.   -Strict I's and O's.   -Continue IV Lasix  CAD -Cardiology input is highly appreciated.   -S/p PCI Dec 2019 (prox LAD - 2 other lesions not amenable - not CABG candidate)  -Cardiology has advised trial of Brilinta.   -Was on Plavix at time of d/c 03/27/18, butpatient told Dr. Thereasa Solo it also caused a rash and was therefore discontinued -No chest pain.  Acute Kidney Injury on CKD Stage 3/4 -Baseline creatinine 1.86 with GFR of 28  -Serum creatinine is up today to 2.7.-Strict I's and O's. -Hyperkalemia has resolved.  Marland KitchenUNCONTrolled HTN -Continue to optimize. -On Coreg 25 Mg p.o. twice daily, IV Lasix  hydralazine 100 Mg p.o. every 8 hourly and isosorbide dinitrate 20 mg orally 3 times daily.  Diarrhea, improved  -No diarrhea stools reported.  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 -CT abdom/pelvis to r/o RPH (on lovenox for DVT prophy) showed No evidence of intraabdominal/pelvic hemorrhage or explanation for decreased hemoglobin. -Hgb/Hct trended down to 6.9/21.3  -Continue to monitor closely.  Type 2 diabetes complicated by CKD -GHW2X 03/15/2018 was 6.4  -Continue to optimize.  Hyperkalemia -Potassium this morning was 4.1 that is post Kayexalate. 04/24/2018: Resolved.   Chronic Back Pain -Continue to optimize. -Reviewed pain medications.  Hx of Breast CA s/p Bilateral mastectomies + chemo -Will need outpatient follow up with PCP    Hypothyroidism -Continue levothyroxine.  History of Seizures/Seizure Disorder and migraines -Continue Divalproex 1000 mg p.o. daily -Has not had a Seizure in 10 years   Hard of Hearing/Ear Congestion -Continue with Debrox -Complete course of Ciprodex otic.   Dyslipidemia -C/w with Atorvastatin 80 mg po qHS  Insomnia -Continue with Melatonin 3 mg po qHS  Generalized Anxiety Disorder -Continue with BuSpirone 15 mg po BID   GERD -C/w Pantoprazole 40 mg po BID  DVT prophylaxis: SCDs  Code Status: FULL CODE  Family Communication: No family present at bedside  Disposition Plan: SNF   Consultants:   Discussed the Case with ID Dr. Megan Salon  Cardiology Dr. Stanford Breed   Procedures:  None Antimicrobials:   Nutrition Problem: Inadequate oral intake Etiology: decreased appetite     Signs/Symptoms: per patient/family report, meal completion < 50%    Interventions: Magic cup, MVI, Hormel Shake  Estimated body mass index is 26.86 kg/m as calculated from the following:   Height as of this encounter: 5\' 4"  (1.626 m).   Weight as of this encounter: 71 kg.   Subjective: Patient reports apparently she had to get help administration come down and give her pain pills so she could sleep.  She is requesting cardiac diet to be changed she is having cough and bringing up phlegm. She denies any chest pain She reported having mild vaginal bleeding which has been resolved.  She is also up-to-date on all her preventive GYN measures.  Objective:  Vitals:   04/24/18 0509 04/24/18 0511 04/24/18 0750 04/24/18 0754  BP: (!) 141/71 (!) 141/72    Pulse:  89 89   Resp:   18   Temp:      TempSrc:      SpO2:  94% 95% 95%  Weight:  71 kg    Height:        Intake/Output Summary (Last 24 hours) at 04/24/2018 0812 Last data filed at 04/24/2018 0514 Gross per 24 hour  Intake 1588.17 ml  Output 400 ml  Net 1188.17 ml   Filed Weights   04/22/18 0345 04/23/18 0541 04/24/18  0511  Weight: 72.2 kg 72.7 kg 71 kg    Examination:  General exam: Appears calm and comfortable  Respiratory system: Coarse to auscultation. Respiratory effort normal. Cardiovascular system: S1 & S2 heard, RRR. No JVD, murmurs, rubs, gallops or clicks. No pedal edema. Gastrointestinal system: Abdomen is nondistended, soft and nontender. No organomegaly or masses felt. Normal bowel sounds heard. Central nervous system: Alert and oriented. No focal neurological deficits. Extremities: 2+ pitting edema Skin: No rashes, lesions or ulcers Psychiatry: Judgement and insight appear normal. Mood & affect appropriate.     Data Reviewed: I have personally reviewed following labs and imaging studies  CBC: Recent Labs  Lab 04/19/18 0219 04/20/18 0641 04/21/18 0448 04/22/18 0520 04/24/18 0328  WBC 4.4 5.3 4.2 7.9 3.4*  NEUTROABS  --  2.5 3.3 6.8 2.7  HGB 8.2* 8.4* 9.1* 9.9* 7.4*  HCT 24.2* 27.3* 28.5* 29.6* 24.2*  MCV 88.6 89.8 89.9 90.0 93.1  PLT 195 226 304 426* 128   Basic Metabolic Panel: Recent Labs  Lab 04/20/18 0641 04/21/18 0626 04/21/18 0756  04/22/18 0520  04/22/18 1603 04/22/18 1931 04/22/18 2330 04/23/18 0403 04/24/18 0328  NA 134* 132* 134*  --  132*  --   --  137  --  137 137  K 5.4* 6.4* 6.5*   < > 5.6*   < > 5.3* 5.2*   5.2* 4.9 4.8 4.1  CL 99 96* 94*  --  95*  --   --  98  --  100 92*  CO2 28 28 27   --  27  --   --  31  --  29 29  GLUCOSE 113* 176* 135*  --  160*  --   --  164*  --  163* 164*  BUN 46* 49* 49*  --  50*  --   --  51*  --  54* 58*  CREATININE 1.67* 1.92* 1.96*  --  1.90*  --   --  1.95*  --  1.90* 2.07*  CALCIUM 8.1* 8.6* 9.0  --  8.6*  --   --  8.5*  --  8.4* 8.8*  MG 2.1 2.1  --   --  2.0  --   --   --   --  1.9 2.1  PHOS 3.2 3.5  --   --  4.0  --   --  4.0  --  4.1 4.8*   < > = values in this interval not displayed.   GFR: Estimated Creatinine Clearance: 25.5 mL/min (A) (by C-G formula based on SCr of 2.07 mg/dL (H)). Liver Function  Tests: Recent Labs  Lab 04/18/18 0225 04/20/18 7867 04/21/18 0626 04/22/18 0520 04/22/18 1931 04/23/18 0403 04/24/18 0328  AST 23 26 25 31   --   --   --   ALT 11 12 15 14   --   --   --  ALKPHOS 36* 45 49 51  --   --   --   BILITOT 0.5 0.4 0.7 0.7  --   --   --   PROT 4.8* 5.2* 6.2* 6.1*  --   --   --   ALBUMIN 1.6* 2.0* 2.2* 2.2* 2.6* 3.0* 3.8   No results for input(s): LIPASE, AMYLASE in the last 168 hours. No results for input(s): AMMONIA in the last 168 hours. Coagulation Profile: No results for input(s): INR, PROTIME in the last 168 hours. Cardiac Enzymes: No results for input(s): CKTOTAL, CKMB, CKMBINDEX, TROPONINI in the last 168 hours. BNP (last 3 results) No results for input(s): PROBNP in the last 8760 hours. HbA1C: No results for input(s): HGBA1C in the last 72 hours. CBG: Recent Labs  Lab 04/23/18 0648 04/23/18 1134 04/23/18 1618 04/23/18 2109 04/24/18 0541  GLUCAP 156* 145* 144* 158* 172*   Lipid Profile: No results for input(s): CHOL, HDL, LDLCALC, TRIG, CHOLHDL, LDLDIRECT in the last 72 hours. Thyroid Function Tests: No results for input(s): TSH, T4TOTAL, FREET4, T3FREE, THYROIDAB in the last 72 hours. Anemia Panel: No results for input(s): VITAMINB12, FOLATE, FERRITIN, TIBC, IRON, RETICCTPCT in the last 72 hours. Sepsis Labs: Recent Labs  Lab 04/22/18 0655  PROCALCITON <0.10    No results found for this or any previous visit (from the past 240 hour(s)).       Radiology Studies: Ct Chest Wo Contrast  Result Date: 04/22/2018 CLINICAL DATA:  68 year old female with history of acute hypoxic respiratory failure. RSV pneumonia. Pulmonary edema. History of congestive heart failure. EXAM: CT CHEST WITHOUT CONTRAST TECHNIQUE: Multidetector CT imaging of the chest was performed following the standard protocol without IV contrast. COMPARISON:  Chest CT 01/17/2018. FINDINGS: Cardiovascular: Heart size is mildly enlarged. There is no significant  pericardial fluid, thickening or pericardial calcification. There is aortic atherosclerosis, as well as atherosclerosis of the great vessels of the mediastinum and the coronary arteries, including calcified atherosclerotic plaque in the left anterior descending and right coronary arteries. Coronary artery stent in the proximal left anterior descending coronary artery. Mediastinum/Nodes: No pathologically enlarged mediastinal or hilar lymph nodes. Please note that accurate exclusion of hilar adenopathy is limited on noncontrast CT scans. Esophagus is unremarkable in appearance. No axillary lymphadenopathy. Lungs/Pleura: Widespread areas of ground-glass attenuation are noted throughout the lungs bilaterally, relatively symmetric and predominantly peribronchovascular in distribution. This is associated with extensive interlobular septal thickening. A few areas of more nodular ground-glass attenuation are also noted throughout the periphery of the lungs. Dependent areas of subsegmental atelectasis are noted in the lower lobes of the lungs bilaterally. Moderate bilateral pleural effusions lying dependently. Upper Abdomen: Aortic atherosclerosis.  Status post cholecystectomy. Musculoskeletal: Chronic appearing compression fractures of T7 and T8, most severe at T8 where there is 20% loss of anterior vertebral body height. There are no aggressive appearing lytic or blastic lesions noted in the visualized portions of the skeleton. IMPRESSION: 1. Appearance of the thorax is most suggestive of congestive heart failure, as detailed above. 2. Some of the pulmonary findings could also be associated with atypical infection, including viral pneumonia, in this patient with documented history of RSV. 3. Aortic atherosclerosis, in addition to 2 vessel coronary artery disease. Please note that although the presence of coronary artery calcium documents the presence of coronary artery disease, the severity of this disease and any  potential stenosis cannot be assessed on this non-gated CT examination. Assessment for potential risk factor modification, dietary therapy or pharmacologic therapy may be warranted,  if clinically indicated. Aortic Atherosclerosis (ICD10-I70.0). Electronically Signed   By: Vinnie Langton M.D.   On: 04/22/2018 09:54        Scheduled Meds:  amLODipine  5 mg Oral Daily   arformoterol  15 mcg Nebulization BID   atorvastatin  80 mg Oral QHS   budesonide (PULMICORT) nebulizer solution  0.5 mg Nebulization BID   busPIRone  15 mg Oral BID   carbamide peroxide  5 drop Right EAR BID   carvedilol  25 mg Oral BID WC   divalproex  1,000 mg Oral Daily   furosemide  80 mg Intravenous BID   guaiFENesin  1,200 mg Oral BID   hydrALAZINE  100 mg Oral Q8H   insulin aspart  0-5 Units Subcutaneous QHS   insulin aspart  0-9 Units Subcutaneous TID WC   isosorbide dinitrate  20 mg Oral TID   levothyroxine  200 mcg Oral Daily   Melatonin  3 mg Oral QHS   methylPREDNISolone (SOLU-MEDROL) injection  40 mg Intravenous Q12H   morphine  15 mg Oral BID   multivitamin with minerals  1 tablet Oral Daily   pantoprazole  40 mg Oral Daily   sodium chloride flush  3 mL Intravenous Q12H   ticagrelor  90 mg Oral BID   Continuous Infusions:   LOS: 13 days      Georgette Shell, MD [facility]  If 7PM-7AM, please contact night-coverage [email or web address] Password [email or web address] 04/24/2018, 8:12 AM

## 2018-04-24 NOTE — Progress Notes (Signed)
10AM Patient bleeding from a spot on her labia, MD Zigmund Daniel made aware via amion. Will monitor.  5pm Bleeding from private area stop. Patient now bleeding from nose and coughing out blood, MD Rodman Key page, gave RN verbal order to d/c brilienta.

## 2018-04-24 NOTE — Progress Notes (Signed)
Occupational Therapy Treatment Patient Details Name: Andrea [name] MRN: [id number] DOB: [birth year] Today's Date: 04/24/2018    [facility] of present [facility] 63[facility], 3VCAD, ischemiccardiomyopathypresented via EMS with resp distress, hypoxia tachypnea, rales, "fluid on her". Treated with BiPAP and now weaned to [facility].   OT comments  Pt progressing towards OT goals. Increased assist needed for ADL Pt stating "I am too weak to do any of that" encouraged Pt to participate in her own self-care as a way to build activity tolerance, and staff will assist as she fatigues. Pt agreeable to hallway ambulation with PT. At the end of session Pt stating that she is very bored, doesn't like watching a bunch of TV - provided with adult coloring book and colored pencils. OT will continue to follow acutely.   Follow Up Recommendations  SNF;Supervision/Assistance - 24 hour    Equipment Recommendations  Other (comment)(defer to next venue)    Recommendations for Other Services      Precautions / Restrictions Precautions Precautions: Fall Precaution Comments: watch 02 Restrictions Weight Bearing Restrictions: No       Mobility Bed Mobility Overal bed mobility: Needs Assistance Bed Mobility: Supine to Sit;Sit to Supine     Supine to sit: Supervision Sit to supine: Supervision   General bed mobility comments: Increased time and effort  Transfers Overall transfer level: Needs assistance Equipment used: Rolling walker (2 wheeled) Transfers: Sit to/from Stand Sit to Stand: Supervision         General transfer comment: S for safety, no physical assist given, extended time     Balance Overall balance assessment: Needs assistance Sitting-balance support: No upper extremity supported;Feet supported Sitting balance-Leahy Scale: Fair     Standing balance support: Bilateral upper extremity supported;No upper extremity supported Standing balance-Leahy Scale:  Fair Standing balance comment: Pt requires BUE during dynamic balance activities                           ADL either performed or assessed with clinical judgement   ADL Overall ADL's : Needs assistance/impaired                 Upper Body Dressing : Moderate assistance;Sitting Upper Body Dressing Details (indicate cue type and reason): gown like robe "I'm too weak, I cannot even tie my bow in the front" Lower Body Dressing: Maximal assistance;Sitting/lateral leans Lower Body Dressing Details (indicate cue type and reason): "I can't put on my shoes, whenever I bend over my nose bleeds and my head gets swimmy"             Functional mobility during ADLs: Min guard;Rolling walker General ADL Comments: Pt encouraged to perform more of her sefl-care activities as a way to build activity tolerance     Vision       Perception     Praxis      Cognition Arousal/Alertness: Awake/alert Behavior During Therapy: Flat affect Overall Cognitive Status: Within Functional Limits for tasks assessed Area of Impairment: Safety/judgement;Awareness                   Current Attention Level: Selective Memory: Decreased short-term memory Following Commands: Follows one step commands with increased time;Follows one step commands consistently Safety/Judgement: Decreased awareness of safety;Decreased awareness of deficits Awareness: Emergent Problem Solving: Decreased initiation;Slow processing;Requires verbal cues General Comments: Slow processing noted        Exercises     Shoulder Instructions  General Comments      Pertinent Vitals/ Pain       Pain Assessment: No/denies pain Pain Score: 0-No pain Faces Pain Scale: No hurt Pain Intervention(s): Monitored during session  Home Living                                          Prior Functioning/Environment              Frequency  Min 3X/week        Progress Toward  Goals  OT Goals(current goals can now be found in the care plan section)  Progress towards OT goals: Progressing toward goals  Acute Rehab OT Goals Patient Stated Goal: to feel better OT Goal Formulation: With patient Time For Goal Achievement: 04/27/18 Potential to Achieve Goals: Good  Plan Discharge plan remains appropriate    Co-evaluation    PT/OT/SLP Co-Evaluation/Treatment: Yes Reason for Co-Treatment: Complexity of the patient's impairments (multi-system involvement)(activity tolerance) PT goals addressed during session: Mobility/safety with mobility;Balance;Strengthening/ROM OT goals addressed during session: ADL's and self-care;Strengthening/ROM      AM-PAC OT "6 Clicks" Daily Activity     Outcome Measure   Help from another person eating meals?: None Help from another person taking care of personal grooming?: A Little Help from another person toileting, which includes using toliet, bedpan, or urinal?: A Little Help from another person bathing (including washing, rinsing, drying)?: A Little Help from another person to put on and taking off regular upper body clothing?: None Help from another person to put on and taking off regular lower body clothing?: A Little 6 Click Score: 20    End of Session Equipment Utilized During Treatment: Rolling walker;Oxygen(2L)  OT Visit Diagnosis: Muscle weakness (generalized) (M62.81);Adult, failure to thrive (R62.7)   Activity Tolerance Patient tolerated treatment well   Patient Left in bed;with call bell/phone within reach   Nurse Communication Mobility status        Time: 1400-1441 OT Time Calculation (min): 41 min  Charges: OT General Charges $OT Visit: 1 Visit OT Treatments $Self Care/Home Management : 8-22 mins  Hulda Humphrey OTR/L Acute Rehabilitation Services Pager: [phone] Office: [place] 04/24/2018, 6:33 PM

## 2018-04-24 NOTE — Progress Notes (Signed)
Progress Note  Patient Name: Andrea [name] Date of Encounter: 04/24/2018  Primary Cardiologist: Kirk Ruths, MD   Subjective   Dyspnea and CP improving; productive cough  Inpatient Medications    Scheduled Meds: . amLODipine  5 mg Oral Daily  . arformoterol  15 mcg Nebulization BID  . atorvastatin  80 mg Oral QHS  . budesonide (PULMICORT) nebulizer solution  0.5 mg Nebulization BID  . busPIRone  15 mg Oral BID  . carbamide peroxide  5 drop Right EAR BID  . carvedilol  25 mg Oral BID WC  . divalproex  1,000 mg Oral Daily  . furosemide  80 mg Intravenous BID  . guaiFENesin  1,200 mg Oral BID  . hydrALAZINE  100 mg Oral Q8H  . insulin aspart  0-5 Units Subcutaneous QHS  . insulin aspart  0-9 Units Subcutaneous TID WC  . isosorbide dinitrate  20 mg Oral TID  . levothyroxine  200 mcg Oral Daily  . Melatonin  3 mg Oral QHS  . methylPREDNISolone (SOLU-MEDROL) injection  40 mg Intravenous Q12H  . morphine  15 mg Oral BID  . multivitamin with minerals  1 tablet Oral Daily  . pantoprazole  40 mg Oral Daily  . sodium chloride flush  3 mL Intravenous Q12H  . ticagrelor  90 mg Oral BID   Continuous Infusions:  PRN Meds: acetaminophen, albuterol, benzonatate, chlorpheniramine-HYDROcodone, hydrALAZINE, loperamide, LORazepam, ondansetron **OR** ondansetron (ZOFRAN) IV, oxyCODONE   Vital Signs    Vitals:   04/24/18 0509 04/24/18 0511 04/24/18 0750 04/24/18 0754  BP: (!) 141/71 (!) 141/72    Pulse:  89 89   Resp:   18   Temp:      TempSrc:      SpO2:  94% 95% 95%  Weight:  71 kg    Height:        Intake/Output Summary (Last 24 hours) at 04/24/2018 0818 Last data filed at 04/24/2018 0514 Gross per 24 hour  Intake 1588.17 ml  Output 400 ml  Net 1188.17 ml   Last 3 Weights 04/24/2018 04/23/2018 04/22/2018  Weight (lbs) 156 lb 8 oz 160 lb 4.8 oz 159 lb 1.6 oz  Weight (kg) 70.988 kg 72.712 kg 72.167 kg      Telemetry    Sinus with PAT- Personally Reviewed    Physical Exam   GEN: NAD WD/WN Neck: no JVD Cardiac: RRR Respiratory: mildly diminished BS bases GI: Soft, NT ND, no masses MS: trace ankle edema Neuro:  No focal findings Psych: Normal affect   Labs    Chemistry Recent Labs  Lab 04/20/18 6948 04/21/18 0626  04/22/18 0520  04/22/18 1931 04/22/18 2330 04/23/18 0403 04/24/18 0328  NA 134* 132*   < > 132*  --  137  --  137 137  K 5.4* 6.4*   < > 5.6*   < > 5.2*  5.2* 4.9 4.8 4.1  CL 99 96*   < > 95*  --  98  --  100 92*  CO2 28 28   < > 27  --  31  --  29 29  GLUCOSE 113* 176*   < > 160*  --  164*  --  163* 164*  BUN 46* 49*   < > 50*  --  51*  --  54* 58*  CREATININE 1.67* 1.92*   < > 1.90*  --  1.95*  --  1.90* 2.07*  CALCIUM 8.1* 8.6*   < > 8.6*  --  8.5*  --  8.4* 8.8*  PROT 5.2* 6.2*  --  6.1*  --   --   --   --   --   ALBUMIN 2.0* 2.2*  --  2.2*  --  2.6*  --  3.0* 3.8  AST 26 25  --  31  --   --   --   --   --   ALT 12 15  --  14  --   --   --   --   --   ALKPHOS 45 49  --  51  --   --   --   --   --   BILITOT 0.4 0.7  --  0.7  --   --   --   --   --   GFRNONAA 31* 26*   < > 27*  --  26*  --  27* 24*  GFRAA 36* 31*   < > 31*  --  30*  --  31* 28*  ANIONGAP 7 8   < > 10  --  8  --  8 16*   < > = values in this interval not displayed.     Hematology Recent Labs  Lab 04/21/18 0448 04/22/18 0520 04/24/18 0328  WBC 4.2 7.9 3.4*  RBC 3.17* 3.29* 2.60*  HGB 9.1* 9.9* 7.4*  HCT 28.5* 29.6* 24.2*  MCV 89.9 90.0 93.1  MCH 28.7 30.1 28.5  MCHC 31.9 33.4 30.6  RDW 14.1 14.0 14.1  PLT 304 426* 368    BNP Recent Labs  Lab 04/22/18 0520  BNP 2,365.5*     Radiology    Ct Chest Wo Contrast  Result Date: 04/22/2018 CLINICAL DATA:  68 year old female with history of acute hypoxic respiratory failure. RSV pneumonia. Pulmonary edema. History of congestive heart failure. EXAM: CT CHEST WITHOUT CONTRAST TECHNIQUE: Multidetector CT imaging of the chest was performed following the standard protocol without IV  contrast. COMPARISON:  Chest CT 01/17/2018. FINDINGS: Cardiovascular: Heart size is mildly enlarged. There is no significant pericardial fluid, thickening or pericardial calcification. There is aortic atherosclerosis, as well as atherosclerosis of the great vessels of the mediastinum and the coronary arteries, including calcified atherosclerotic plaque in the left anterior descending and right coronary arteries. Coronary artery stent in the proximal left anterior descending coronary artery. Mediastinum/Nodes: No pathologically enlarged mediastinal or hilar lymph nodes. Please note that accurate exclusion of hilar adenopathy is limited on noncontrast CT scans. Esophagus is unremarkable in appearance. No axillary lymphadenopathy. Lungs/Pleura: Widespread areas of ground-glass attenuation are noted throughout the lungs bilaterally, relatively symmetric and predominantly peribronchovascular in distribution. This is associated with extensive interlobular septal thickening. A few areas of more nodular ground-glass attenuation are also noted throughout the periphery of the lungs. Dependent areas of subsegmental atelectasis are noted in the lower lobes of the lungs bilaterally. Moderate bilateral pleural effusions lying dependently. Upper Abdomen: Aortic atherosclerosis.  Status post cholecystectomy. Musculoskeletal: Chronic appearing compression fractures of T7 and T8, most severe at T8 where there is 20% loss of anterior vertebral body height. There are no aggressive appearing lytic or blastic lesions noted in the visualized portions of the skeleton. IMPRESSION: 1. Appearance of the thorax is most suggestive of congestive heart failure, as detailed above. 2. Some of the pulmonary findings could also be associated with atypical infection, including viral pneumonia, in this patient with documented history of RSV. 3. Aortic atherosclerosis, in addition to 2 vessel coronary artery disease. Please note that although the  presence of coronary artery calcium documents the presence of coronary artery disease, the severity of this disease and any potential stenosis cannot be assessed on this non-gated CT examination. Assessment for potential risk factor modification, dietary therapy or pharmacologic therapy may be warranted, if clinically indicated. Aortic Atherosclerosis (ICD10-I70.0). Electronically Signed   By: Vinnie Langton M.D.   On: 04/22/2018 09:54    Patient Profile     68 year old female with past medical history of coronary artery disease status post PCI of LAD, ischemic cardiomyopathy improved on most recent echocardiogram, diabetes mellitus, hypertension, chronic stage IV kidney disease, seizure disorder for evaluation of chest pain and acute on chronic diastolic congestive heart failure.  Patient is status post PCI of LAD December 2019.  Admitted March 21 with dyspnea, chest pressure and lower extremity edema.  Treated for pneumonia and also noted to have RSV.    Last echocardiogram February 2020 showed normal LV function.  Assessment & Plan    1 chest pain-improving.  Symptoms increased with cough.  No plans for further ischemia evaluation.    2 acute on chronic diastolic congestive heart failure-most recent echocardiogram showed improvement in LV function.  I/O +1188 (? Accuracy). Wt 156.  Volume status improving.  Decrease Lasix to 40 mg twice daily and follow renal function. Can likely convert to po in AM.   3 coronary artery disease-status post recent PCI in December.    Patient was not on antiplatelet medications at time of admission as she stated she could not tolerate aspirin and had a rash with Plavix.  I have reinitiated Brilinta; appears to be tolerating at this point. Follow hgb.  4 hyperkalemia-resolved  5 chronic stage IV kidney disease-follow renal function closely with diuresis.  6 hypertension-blood pressure remains elevated.  Will no change meds as coreg increased recently and  amlodipine added.   7 pneumonia-COPD-pulmonary toilet per primary care.  For questions or updates, please contact [place] Please consult [email or web address] for contact info under        Signed, Kirk Ruths, MD  04/24/2018, 8:18 AM

## 2018-04-24 NOTE — Progress Notes (Signed)
PT Cancellation Note  Patient Details Name: Andrea [name] MRN: [id number] DOB: 1950/03/28   Cancelled Treatment:    Reason Eval/Treat Not Completed: Patient declined, no reason specified;Fatigue/lethargy limiting ability to participate Patient received in bed, pleasant but politely declining PT. She reports her Hgb is low and she is bleeding from her nether-regions "I'm not sure if its from my rectum or vagina". She requests PT to try to come back in the afternoon if able. Will try to return if time/schedule allow.    Deniece Ree PT, DPT, CBIS  Supplemental Physical Therapist [facility]    Pager [phone] Acute Rehab Office [phone]

## 2018-04-25 LAB — COMPREHENSIVE METABOLIC PANEL
ALT: 13 U/L (ref 0–44)
AST: 23 U/L (ref 15–41)
Albumin: 3.7 g/dL (ref 3.5–5.0)
Alkaline Phosphatase: 41 U/L (ref 38–126)
Anion gap: 10 (ref 5–15)
BUN: 67 mg/dL — ABNORMAL HIGH (ref 8–23)
CO2: 29 mmol/L (ref 22–32)
Calcium: 8.7 mg/dL — ABNORMAL LOW (ref 8.9–10.3)
Chloride: 96 mmol/L — ABNORMAL LOW (ref 98–111)
Creatinine, Ser: 2.32 mg/dL — ABNORMAL HIGH (ref 0.44–1.00)
GFR calc Af Amer: 24 mL/min — ABNORMAL LOW (ref >60)
GFR calc non Af Amer: 21 mL/min — ABNORMAL LOW (ref >60)
Glucose, Bld: 181 mg/dL — ABNORMAL HIGH (ref 70–99)
Potassium: 4.1 mmol/L (ref 3.5–5.1)
Sodium: 135 mmol/L (ref 135–145)
Total Bilirubin: 0.8 mg/dL (ref 0.3–1.2)
Total Protein: 5.9 g/dL — ABNORMAL LOW (ref 6.5–8.1)

## 2018-04-25 LAB — CBC WITH DIFFERENTIAL/PLATELET
Abs Immature Granulocytes: 0.03 10*3/uL (ref 0.00–0.07)
Basophils Absolute: 0 10*3/uL (ref 0.0–0.1)
Basophils Relative: 0 %
Eosinophils Absolute: 0 10*3/uL (ref 0.0–0.5)
Eosinophils Relative: 0 %
HCT: 29.4 % — ABNORMAL LOW (ref 36.0–46.0)
Hemoglobin: 9.2 g/dL — ABNORMAL LOW (ref 12.0–15.0)
Immature Granulocytes: 0 %
Lymphocytes Relative: 6 %
Lymphs Abs: 0.5 10*3/uL — ABNORMAL LOW (ref 0.7–4.0)
MCH: 28.5 pg (ref 26.0–34.0)
MCHC: 31.3 g/dL (ref 30.0–36.0)
MCV: 91 fL (ref 80.0–100.0)
Monocytes Absolute: 0.4 10*3/uL (ref 0.1–1.0)
Monocytes Relative: 5 %
Neutro Abs: 6.7 10*3/uL (ref 1.7–7.7)
Neutrophils Relative %: 89 %
Platelets: 352 10*3/uL (ref 150–400)
RBC: 3.23 MIL/uL — ABNORMAL LOW (ref 3.87–5.11)
RDW: 14 % (ref 11.5–15.5)
WBC: 7.6 10*3/uL (ref 4.0–10.5)
nRBC: 0 % (ref 0.0–0.2)

## 2018-04-25 LAB — GLUCOSE, CAPILLARY
Glucose-Capillary: 182 mg/dL — ABNORMAL HIGH (ref 70–99)
Glucose-Capillary: 173 mg/dL — ABNORMAL HIGH (ref 70–99)
Glucose-Capillary: 190 mg/dL — ABNORMAL HIGH (ref 70–99)
Glucose-Capillary: 288 mg/dL — ABNORMAL HIGH (ref 70–99)

## 2018-04-25 LAB — BPAM RBC
Blood Product Expiration Date: 202004132359
ISSUE DATE / TIME: 202004031542
Unit Type and Rh: 7300

## 2018-04-25 LAB — TYPE AND SCREEN
ABO/RH(D): B POS
Antibody Screen: NEGATIVE
Unit division: 0

## 2018-04-25 MED ORDER — FUROSEMIDE 40 MG PO TABS
40.0000 mg | ORAL_TABLET | Freq: Two times a day (BID) | ORAL | Status: DC
  Administered 2018-04-25: 40 mg via ORAL
  Filled 2018-04-25: qty 1

## 2018-04-25 MED ORDER — LORAZEPAM 0.5 MG PO TABS
0.5000 mg | ORAL_TABLET | Freq: Four times a day (QID) | ORAL | Status: DC | PRN
  Administered 2018-04-25 – 2018-04-26 (×2): 0.5 mg via ORAL
  Filled 2018-04-25 (×2): qty 1

## 2018-04-25 MED ORDER — METHYLPREDNISOLONE SODIUM SUCC 40 MG IJ SOLR
40.0000 mg | Freq: Every day | INTRAMUSCULAR | Status: DC
  Administered 2018-04-25 – 2018-04-26 (×2): 40 mg via INTRAVENOUS
  Filled 2018-04-25: qty 1

## 2018-04-25 NOTE — Progress Notes (Signed)
Progress Note  Patient Name: Andrea [name] Date of Encounter: 04/25/2018  Primary Cardiologist: Kirk Ruths, MD   Subjective   CP with cough; dyspnea continues to improve  Inpatient Medications    Scheduled Meds: . amLODipine  5 mg Oral Daily  . arformoterol  15 mcg Nebulization BID  . atorvastatin  80 mg Oral QHS  . budesonide (PULMICORT) nebulizer solution  0.5 mg Nebulization BID  . busPIRone  15 mg Oral BID  . carbamide peroxide  5 drop Right EAR BID  . carvedilol  25 mg Oral BID WC  . divalproex  1,000 mg Oral Daily  . furosemide  40 mg Intravenous BID  . guaiFENesin  1,200 mg Oral BID  . hydrALAZINE  100 mg Oral Q8H  . insulin aspart  0-5 Units Subcutaneous QHS  . insulin aspart  0-9 Units Subcutaneous TID WC  . isosorbide dinitrate  20 mg Oral TID  . levothyroxine  200 mcg Oral Daily  . Melatonin  3 mg Oral QHS  . methylPREDNISolone (SOLU-MEDROL) injection  40 mg Intravenous Daily  . morphine  15 mg Oral BID  . multivitamin with minerals  1 tablet Oral Daily  . pantoprazole  40 mg Oral Daily  . sodium chloride flush  3 mL Intravenous Q12H   Continuous Infusions:  PRN Meds: acetaminophen, albuterol, benzonatate, chlorpheniramine-HYDROcodone, hydrALAZINE, loperamide, LORazepam, ondansetron **OR** ondansetron (ZOFRAN) IV, oxyCODONE   Vital Signs    Vitals:   04/25/18 0614 04/25/18 0821 04/25/18 0826 04/25/18 0833  BP: (!) 152/93   (!) 160/87  Pulse: 91   88  Resp: 18   18  Temp: 98.2 F (36.8 C)   97.8 F (36.6 C)  TempSrc: Oral   Oral  SpO2: 97% 99% 98% 98%  Weight:      Height:        Intake/Output Summary (Last 24 hours) at 04/25/2018 1114 Last data filed at 04/25/2018 0900 Gross per 24 hour  Intake 960 ml  Output 400 ml  Net 560 ml   Last 3 Weights 04/25/2018 04/24/2018 04/23/2018  Weight (lbs) 152 lb 9.6 oz 156 lb 8 oz 160 lb 4.8 oz  Weight (kg) 69.219 kg 70.988 kg 72.712 kg      Telemetry    Sinus - Personally Reviewed   Physical  Exam   GEN: NAD WD/WN sleeping prior to visit Neck: no JVD, supple Cardiac: RRR Respiratory: mildly diminished BS bases; no wheeze GI: Soft, NT ND, no masses MS: trace ankle edema Neuro:  Grossly intact Psych: pt states she is anxious  Labs    Chemistry Recent Labs  Lab 04/21/18 0626  04/22/18 0520  04/23/18 0403 04/24/18 0328 04/25/18 0306  NA 132*   < > 132*   < > 137 137 135  K 6.4*   < > 5.6*   < > 4.8 4.1 4.1  CL 96*   < > 95*   < > 100 92* 96*  CO2 28   < > 27   < > 29 29 29   GLUCOSE 176*   < > 160*   < > 163* 164* 181*  BUN 49*   < > 50*   < > 54* 58* 67*  CREATININE 1.92*   < > 1.90*   < > 1.90* 2.07* 2.32*  CALCIUM 8.6*   < > 8.6*   < > 8.4* 8.8* 8.7*  PROT 6.2*  --  6.1*  --   --   --  5.9*  ALBUMIN 2.2*  --  2.2*   < > 3.0* 3.8 3.7  AST 25  --  31  --   --   --  23  ALT 15  --  14  --   --   --  13  ALKPHOS 49  --  51  --   --   --  41  BILITOT 0.7  --  0.7  --   --   --  0.8  GFRNONAA 26*   < > 27*   < > 27* 24* 21*  GFRAA 31*   < > 31*   < > 31* 28* 24*  ANIONGAP 8   < > 10   < > 8 16* 10   < > = values in this interval not displayed.     Hematology Recent Labs  Lab 04/22/18 0520 04/24/18 0328 04/25/18 0306  WBC 7.9 3.4* 7.6  RBC 3.29* 2.60* 3.23*  HGB 9.9* 7.4* 9.2*  HCT 29.6* 24.2* 29.4*  MCV 90.0 93.1 91.0  MCH 30.1 28.5 28.5  MCHC 33.4 30.6 31.3  RDW 14.0 14.1 14.0  PLT 426* 368 352    BNP Recent Labs  Lab 04/22/18 0520  BNP 2,365.5*     Patient Profile     68 year old female with past medical history of coronary artery disease status post PCI of LAD, ischemic cardiomyopathy improved on most recent echocardiogram, diabetes mellitus, hypertension, chronic stage IV kidney disease, seizure disorder for evaluation of chest pain and acute on chronic diastolic congestive heart failure.  Patient is status post PCI of LAD December 2019.  Admitted March 21 with dyspnea, chest pressure and lower extremity edema.  Treated for pneumonia and also  noted to have RSV.    Last echocardiogram February 2020 showed normal LV function.  Assessment & Plan    1 chest pain-improving.  Symptoms increased with cough.  No plans for further ischemia evaluation.    2 acute on chronic diastolic congestive heart failure-change Lasix to 40 mg by mouth twice daily.  3 coronary artery disease-status post recent PCI in December.    Patient was not on antiplatelet medications at time of admission as she stated she could not tolerate aspirin and question had a rash to Plavix.  She had some bleeding by report and Brilinta has been placed on hold.  She will be at risk for acute stent thrombosis if she is not taking antiplatelet medications.  Would try Plavix 75 mg daily at discharge.  4 hyperkalemia-resolved  5 chronic stage IV kidney disease-follow renal function closely with diuresis.  6 hypertension-blood pressure remains elevated.  Will not change meds as coreg increased recently and amlodipine added.   7 pneumonia-COPD-pulmonary toilet per primary care.  For questions or updates, please contact [place] Please consult [email or web address] for contact info under        Signed, Kirk Ruths, MD  04/25/2018, 11:14 AM

## 2018-04-25 NOTE — Progress Notes (Signed)
Pt asking for IV ativan. Pt appears in NAD. Pt falling asleep if left alone. Per RN shift report, pt was very sleepy after getting ativan overnight. Paged MD. MD gave verbal order to change to PO.

## 2018-04-25 NOTE — Progress Notes (Signed)
Physical Therapy Treatment Patient Details Name: Andrea [name] MRN: [id number] DOB: April 24, 1950 Today's Date: 04/25/2018    History of Present Illness 86[facility], 3VCAD, ischemiccardiomyopathypresented via EMS with resp distress, hypoxia tachypnea, rales, "fluid on her". Treated with BiPAP and now weaned to [facility].    PT Comments    Pt limited by fatigue this session, requiring standing rest breaks and presented with very slow gait. Pt also reporting LE and UE "wobbling" due to weakness, not observed by PT. Pt states to PT while PT was encouraging pt to mobilize that she thinks most physical therapists are "nasty", including the one documenting. PT assured pt that PT wants to help pt mobilize better and reduce hospital-related weakness. Pt agreeable from this point on. PT to continue to follow acutely.   Of note, pt wore mask during mobility in hall, as pt with multiple productive coughs this session.    Follow Up Recommendations  SNF;Supervision/Assistance - 24 hour     Equipment Recommendations  Other (comment)(TBD by next venue)    Recommendations for Other Services       Precautions / Restrictions Precautions Precautions: Fall Precaution Comments: watch 02 Restrictions Weight Bearing Restrictions: No Other Position/Activity Restrictions: 2L O2 >90% and HR <130 BPm with activity.    Mobility  Bed Mobility Overal bed mobility: Needs Assistance Bed Mobility: Supine to Sit;Sit to Supine     Supine to sit: Min guard Sit to supine: Min guard   General bed mobility comments: min guard for safety, increased time/effort, use of bedrails to elevate trunk to come to sitting.   Transfers Overall transfer level: Needs assistance Equipment used: Rolling walker (2 wheeled) Transfers: Sit to/from Stand Sit to Stand: Supervision         General transfer comment: supervision for safety, increased time to rise.    Ambulation/Gait Ambulation/Gait assistance: Min guard Gait Distance (Feet): 350 Feet Assistive device: Rolling walker (2 wheeled) Gait Pattern/deviations: Step-through pattern;Decreased stride length;Trunk flexed;Drifts right/left;Shuffle Gait velocity: decr    General Gait Details: very slow gait, verbal cuing for pt to take bigger steps to avoid shuffling but pt declines. Pt taking standing rest breaks as needed during ambulation. Pt limited by fatigue.   Stairs             Wheelchair Mobility    Modified Rankin (Stroke Patients Only)       Balance Overall balance assessment: Needs assistance Sitting-balance support: No upper extremity supported;Feet supported Sitting balance-Leahy Scale: Fair     Standing balance support: Bilateral upper extremity supported;No upper extremity supported Standing balance-Leahy Scale: Fair Standing balance comment: able to stand without UE support, requires RW for dynamic standing activity.                             Cognition Arousal/Alertness: Awake/alert Behavior During Therapy: Flat affect Overall Cognitive Status: Within Functional Limits for tasks assessed Area of Impairment: Safety/judgement;Attention;Following commands;Problem solving                   Current Attention Level: Selective   Following Commands: Follows one step commands with increased time;Follows one step commands consistently Safety/Judgement: Decreased awareness of safety;Decreased awareness of deficits   Problem Solving: Decreased initiation;Slow processing;Requires verbal cues General Comments: Pt appeared irritated with PT this session. Pt states "physical therapists are nasty, you are too abrupt".       Exercises      General Comments General comments (  skin integrity, edema, etc.): Sats 95-98% during and after ambulation on 2LO2 via [place], HR WNL during ambulation.       Pertinent Vitals/Pain Pain Assessment: Faces Faces  Pain Scale: Hurts a little bit Pain Location: abdomen and chest  Pain Descriptors / Indicators: Aching;Discomfort Pain Intervention(s): Limited activity within patient's tolerance;Monitored during session;Premedicated before session;Repositioned    Home Living                      Prior Function            PT Goals (current goals can now be found in the care plan section) Acute Rehab PT Goals Patient Stated Goal: to feel better PT Goal Formulation: With patient Time For Goal Achievement: 04/26/18 Potential to Achieve Goals: Good Progress towards PT goals: Progressing toward goals    Frequency    Min 2X/week      PT Plan Current plan remains appropriate    Co-evaluation              AM-PAC PT "6 Clicks" Mobility   Outcome Measure  Help needed turning from your back to your side while in a flat bed without using bedrails?: None Help needed moving from lying on your back to sitting on the side of a flat bed without using bedrails?: A Little Help needed moving to and from a bed to a chair (including a wheelchair)?: A Little Help needed standing up from a chair using your arms (e.g., wheelchair or bedside chair)?: A Little Help needed to walk in hospital room?: A Little Help needed climbing 3-5 steps with a railing? : A Lot 6 Click Score: 18    End of Session Equipment Utilized During Treatment: Oxygen;Gait belt Activity Tolerance: Patient limited by fatigue Patient left: in bed;with call bell/phone within reach;with bed alarm set Nurse Communication: Mobility status PT Visit Diagnosis: Muscle weakness (generalized) (M62.81);Unsteadiness on feet (R26.81)     Time: 1962-2297 PT Time Calculation (min) (ACUTE ONLY): 34 min  Charges:  $Gait Training: 23-37 mins                     Julien Girt, PT Acute Rehabilitation Services Pager [phone]  Office [phone]   Braidan Ricciardi D Elonda Husky 04/25/2018, 11:35 AM

## 2018-04-25 NOTE — Progress Notes (Signed)
Pt resting quietly in bed at this time. No distress noted.

## 2018-04-25 NOTE — Progress Notes (Signed)
Patient awake and alert, agreeable to plan of care for tonight.

## 2018-04-25 NOTE — Plan of Care (Signed)
  Problem: Health Behavior/Discharge Planning: Goal: Ability to manage health-related needs will improve Outcome: Progressing   Problem: Education: Goal: Knowledge of General Education information will improve Description: Including pain rating scale, medication(s)/side effects and non-pharmacologic comfort measures Outcome: Progressing   

## 2018-04-25 NOTE — Progress Notes (Signed)
Patient resting in bed with no obvious signs of distress. No c/o. Call bell at side. Meds taken without difficulty.

## 2018-04-25 NOTE — Progress Notes (Signed)
Pt reporting anxiety. States "I used to take that xanax stuff". Pt asking about when her ativan is available. Pt educated and coached on relaxation techniques and appropriate use of anxiety medication. Pt educated that the last time she took the ativan she was extremely sleepy and in order to participate with care during the day she needs to try to manage her anxiety without IV medication. Pt verbalizes that she wants to try to walk and she understands the need to be alert during the day. Pt appears calm at this time, NAD, resting in bed.

## 2018-04-25 NOTE — Progress Notes (Signed)
PROGRESS NOTE    Andrea [name]  GLO:[id number] DOB: 1950-06-24 DOA: 04/11/2018 PCP: Medicine, [facility]  Brief Narrative:68 year old female 68yo from [facility] with past medical history significant for ischemic cardiomyopathy andCHFw/ recent improved EF, 3V CADs/p PCI of LAD Dec 2019 / not a CABG candidate,HTN, Seizure disorder,DM, Hx of Breast Cancer and other comorbitieswho presented with resp distresswith sudden worsening of SOB, chest pressure, and leg edema. She was found to be RSV positive, and required noninvasive mechanical ventilation (BiPAP) and diuresis. She was also recently hospitalized from2/23 - 3/6 for CHF, AKI, and confusion from narcotics  Assessment & Plan:   Principal Problem:   Acute hypoxemic respiratory failure ([place]) Active Problems:   Chronic pain   Chest pain   Acute respiratory failure with hypoxia (HCC)   Anemia   Acute on chronic diastolic CHF (congestive heart failure) (HCC)   RSV (respiratory syncytial virus pneumonia)   DM type 2 (diabetes mellitus, type 2) (HCC)   CKD (chronic kidney disease), stage IV (HCC)  Acute Hypoxic Respiratory Failure - RSV pneumonia+ pulmonary edemain the setting of Acute on Chronic Diastolic CHFand chronic kidney disease stage III with probable mild AKI: -Presented with hypoxia, tachycardia, tachypnea placed on BiPAP; Now weaned off of BiPAP and is on 2 Liters  -Chest x-ray done on admission which revealed bilateral pulmonary infiltrates suggestive of multilobar pneumonia -Respiratory panel positive for RSV -Completed 5 days of IV Zosyn  -Blood cultures x2 04/11/2018 showed NGTD  -Sputum Gram Stain showed Moderate WBC Present, Both PMN and Mononuclear with Abudant Yeast with Psuedohyphae and Cx showed Few Candida Glabrata and Few MRSA which was Resistant to Ciprofloxacin, Erythromycin, and Oxacillin; ? if she is colonized with MRSA;  -As per prior documentation, infectious disease team is  of the impression that the Candida glabrata is insignificant and the MRSA is likely a colonization as she has been afebrile with no leukocytosis and no worsening hypoxemic symptoms -Patient is requiring only 2 L of supplemental oxygen via nasal cannula.  -Continuous pulse oximetry and maintain O2 saturation greater than 90% -CT scan of the chest done without contrast on 04/22/2018 is suggestive of likely congestive heart failure, however, atypical infection cannot be ruled out.    Acute on Chronic Diastolic CHF  -Hx ofrecoveredSystolic CHF Dec 1884 (EF 30-35% TTE) -EF 03/05/18 noted EF 55-60% w/ mild LVH, normal RV fxn, and no valvular issues  -Cardiology input is highly appreciated.  -lasix changed to po 40 mg bid  CAD -Cardiology input is highly appreciated.  -S/p PCI Dec 2019 (prox LAD - 2 other lesions not amenable - not CABG candidate)  -Cardiology has advised trial of Brilinta.  -Was on Plavix at time of d/c 03/27/18, butpatient told Dr. Thereasa Solo it also caused a rash and was therefore discontinued -No chest pain. -will need to restart plavix watch for bleeding  Acute Kidney Injury on CKD Stage 3/4 -Baseline creatinine 1.86 with GFR of 28  -Serum creatinine is up today to 2.7.-Strict I's and O's. -Hyperkalemia has resolved.  Marland KitchenUNCONTrolled HTN -Continue to optimize. -On Coreg 25 Mg p.o. twice daily, IV Lasix  hydralazine 100 Mg p.o. every 8 hourly and isosorbide dinitrate 20 mg orally 3 times daily.  Diarrhea, improved  -No diarrhea stools reported.  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 -CT abdom/pelvis to r/o RPH (on  lovenox for DVT prophy) showed No evidence of intraabdominal/pelvic hemorrhage or explanation for decreased hemoglobin. -Hgb/Hct trended down to 6.9/21.3  -Continue to  monitor closely.  Type 2 diabetes complicated by CKD -OZH0Q 03/15/2018 was 6.4  -Continue to optimize.  Hyperkalemia -Potassium this morning was 4.1 that is post Kayexalate. 04/24/2018: Resolved.   Chronic Back Pain -Continue to optimize. -Reviewed pain medications  Hx of Breast CA s/p Bilateral mastectomies + chemo -Will need outpatient follow up with PCP   Hypothyroidism -Continue levothyroxine.  History of Seizures/Seizure Disorder and migraines -Continue Divalproex 1000 mg p.o. daily -Has not had a Seizure in 10 years   Hard of Hearing/Ear Congestion -Continue with Debrox -Complete course of Ciprodex otic.   Dyslipidemia -C/w with Atorvastatin 80 mg po qHS  Insomnia -Continue with Melatonin 3 mg po qHS  Generalized Anxiety Disorder -Continue with BuSpirone 15 mg po BID   GERD -C/w Pantoprazole 40 mg po BID DVT prophylaxis:SCDs  Code Status:FULL CODE  Family Communication:No family present at bedside  Disposition Plan:SNF   Consultants:  Discussed the Case with ID Dr. Megan Salon  Cardiology Dr. Stanford Breed   Procedures: None Antimicrobials:  Nutrition Problem: Inadequate oral intake Etiology: decreased appetite     Signs/Symptoms: per patient/family report, meal completion < 50%    Interventions: Magic cup, MVI, Hormel Shake  Estimated body mass index is 26.19 kg/m as calculated from the following:   Height as of this encounter: 5\' 4"  (1.626 m).   Weight as of this encounter: 69.2 kg.    Subjective: C/o coughing up blood last night with bleeding from nose and labia which has been resolved.did not get brillinta today Objective: Vitals:   04/25/18 0614 04/25/18 0821 04/25/18 0826 04/25/18 0833  BP: (!) 152/93   (!) 160/87  Pulse: 91   88  Resp: 18   18  Temp: 98.2 F (36.8 C)   97.8 F (36.6 C)  TempSrc: Oral   Oral  SpO2: 97% 99% 98% 98%  Weight:      Height:        Intake/Output Summary (Last 24 hours) at  04/25/2018 1023 Last data filed at 04/25/2018 0900 Gross per 24 hour  Intake 960 ml  Output 400 ml  Net 560 ml   Filed Weights   04/23/18 0541 04/24/18 0511 04/25/18 0345  Weight: 72.7 kg 71 kg 69.2 kg    Examination:  General exam: Appears calm and comfortable  Respiratory system: Clear to auscultation. Respiratory effort normal. Cardiovascular system: S1 & S2 heard, RRR. No JVD, murmurs, rubs, gallops or clicks. No pedal edema. Gastrointestinal system: Abdomen is nondistended, soft and nontender. No organomegaly or masses felt. Normal bowel sounds heard. Central nervous system: Alert and oriented. No focal neurological deficits. Extremities: 1 plus ankle edema Skin: No rashes, lesions or ulcers Psychiatry: Judgement and insight appear normal. Mood & affect appropriate.     Data Reviewed: I have personally reviewed following labs and imaging studies  CBC: Recent Labs  Lab 04/20/18 0641 04/21/18 0448 04/22/18 0520 04/24/18 0328 04/25/18 0306  WBC 5.3 4.2 7.9 3.4* 7.6  NEUTROABS 2.5 3.3 6.8 2.7 6.7  HGB 8.4* 9.1* 9.9* 7.4* 9.2*  HCT 27.3* 28.5* 29.6* 24.2* 29.4*  MCV 89.8 89.9 90.0 93.1 91.0  PLT 226 304 426* 368 657   Basic Metabolic Panel: Recent Labs  Lab 04/20/18 0641 04/21/18 0626  04/22/18 0520  04/22/18 1931 04/22/18 2330 04/23/18 0403 04/24/18 0328 04/25/18 0306  NA 134* 132*   < > 132*  --  137  --  137 137 135  K 5.4* 6.4*   < > 5.6*   < > 5.2*  5.2* 4.9 4.8 4.1 4.1  CL 99 96*   < > 95*  --  98  --  100 92* 96*  CO2 28 28   < > 27  --  31  --  29 29 29   GLUCOSE 113* 176*   < > 160*  --  164*  --  163* 164* 181*  BUN 46* 49*   < > 50*  --  51*  --  54* 58* 67*  CREATININE 1.67* 1.92*   < > 1.90*  --  1.95*  --  1.90* 2.07* 2.32*  CALCIUM 8.1* 8.6*   < > 8.6*  --  8.5*  --  8.4* 8.8* 8.7*  MG 2.1 2.1  --  2.0  --   --   --  1.9 2.1  --   PHOS 3.2 3.5  --  4.0  --  4.0  --  4.1 4.8*  --    < > = values in this interval not displayed.   GFR:  Estimated Creatinine Clearance: 22.5 mL/min (A) (by C-G formula based on SCr of 2.32 mg/dL (H)). Liver Function Tests: Recent Labs  Lab 04/20/18 0641 04/21/18 0626 04/22/18 0520 04/22/18 1931 04/23/18 0403 04/24/18 0328 04/25/18 0306  AST 26 25 31   --   --   --  23  ALT 12 15 14   --   --   --  13  ALKPHOS 45 49 51  --   --   --  41  BILITOT 0.4 0.7 0.7  --   --   --  0.8  PROT 5.2* 6.2* 6.1*  --   --   --  5.9*  ALBUMIN 2.0* 2.2* 2.2* 2.6* 3.0* 3.8 3.7   No results for input(s): LIPASE, AMYLASE in the last 168 hours. No results for input(s): AMMONIA in the last 168 hours. Coagulation Profile: No results for input(s): INR, PROTIME in the last 168 hours. Cardiac Enzymes: No results for input(s): CKTOTAL, CKMB, CKMBINDEX, TROPONINI in the last 168 hours. BNP (last 3 results) No results for input(s): PROBNP in the last 8760 hours. HbA1C: No results for input(s): HGBA1C in the last 72 hours. CBG: Recent Labs  Lab 04/24/18 0541 04/24/18 1149 04/24/18 1626 04/24/18 2124 04/25/18 0604  GLUCAP 172* 185* 203* 192* 182*   Lipid Profile: No results for input(s): CHOL, HDL, LDLCALC, TRIG, CHOLHDL, LDLDIRECT in the last 72 hours. Thyroid Function Tests: No results for input(s): TSH, T4TOTAL, FREET4, T3FREE, THYROIDAB in the last 72 hours. Anemia Panel: No results for input(s): VITAMINB12, FOLATE, FERRITIN, TIBC, IRON, RETICCTPCT in the last 72 hours. Sepsis Labs: Recent Labs  Lab 04/22/18 0655  PROCALCITON <0.10    No results found for this or any previous visit (from the past 240 hour(s)).       Radiology Studies: No results found.      Scheduled Meds: . amLODipine  5 mg Oral Daily  . arformoterol  15 mcg Nebulization BID  . atorvastatin  80 mg Oral QHS  . budesonide (PULMICORT) nebulizer solution  0.5 mg Nebulization BID  . busPIRone  15 mg Oral BID  . carbamide peroxide  5 drop Right EAR BID  . carvedilol  25 mg Oral BID WC  . divalproex  1,000 mg Oral  Daily  . furosemide  40 mg Intravenous BID  . guaiFENesin  1,200 mg  Oral BID  . hydrALAZINE  100 mg Oral Q8H  . insulin aspart  0-5 Units Subcutaneous QHS  . insulin aspart  0-9 Units Subcutaneous TID WC  . isosorbide dinitrate  20 mg Oral TID  . levothyroxine  200 mcg Oral Daily  . Melatonin  3 mg Oral QHS  . methylPREDNISolone (SOLU-MEDROL) injection  40 mg Intravenous Daily  . morphine  15 mg Oral BID  . multivitamin with minerals  1 tablet Oral Daily  . pantoprazole  40 mg Oral Daily  . sodium chloride flush  3 mL Intravenous Q12H   Continuous Infusions:   LOS: 14 days     Georgette Shell, MD [facility]  If 7PM-7AM, please contact night-coverage [email or web address] Password [facility] 04/25/2018, 10:23 AM

## 2018-04-26 LAB — BASIC METABOLIC PANEL
Anion gap: 11 (ref 5–15)
BUN: 83 mg/dL — ABNORMAL HIGH (ref 8–23)
CO2: 30 mmol/L (ref 22–32)
Calcium: 8.6 mg/dL — ABNORMAL LOW (ref 8.9–10.3)
Chloride: 93 mmol/L — ABNORMAL LOW (ref 98–111)
Creatinine, Ser: 2.46 mg/dL — ABNORMAL HIGH (ref 0.44–1.00)
GFR calc Af Amer: 23 mL/min — ABNORMAL LOW (ref >60)
GFR calc non Af Amer: 20 mL/min — ABNORMAL LOW (ref >60)
Glucose, Bld: 119 mg/dL — ABNORMAL HIGH (ref 70–99)
Potassium: 4.1 mmol/L (ref 3.5–5.1)
Sodium: 134 mmol/L — ABNORMAL LOW (ref 135–145)

## 2018-04-26 LAB — GLUCOSE, CAPILLARY
Glucose-Capillary: 111 mg/dL — ABNORMAL HIGH (ref 70–99)
Glucose-Capillary: 166 mg/dL — ABNORMAL HIGH (ref 70–99)

## 2018-04-26 LAB — DIC (DISSEMINATED INTRAVASCULAR COAGULATION)PANEL
INR: 1.1 (ref 0.8–1.2)
Platelets: 321 10*3/uL (ref 150–400)
Prothrombin Time: 14.2 seconds (ref 11.4–15.2)
Smear Review: NONE SEEN
aPTT: 25 seconds (ref 24–36)

## 2018-04-26 LAB — DIC (DISSEMINATED INTRAVASCULAR COAGULATION) PANEL
D-Dimer, Quant: 2.69 ug/mL-FEU — ABNORMAL HIGH (ref 0.00–0.50)
Fibrinogen: 251 mg/dL (ref 210–475)

## 2018-04-26 MED ORDER — CLOPIDOGREL BISULFATE 75 MG PO TABS: 75.0000 mg | ORAL_TABLET | Freq: Every day | ORAL | 11 refills | Status: AC

## 2018-04-26 MED ORDER — PREDNISONE 20 MG PO TABS: 30.0000 mg | ORAL_TABLET | Freq: Every day | ORAL | Status: DC

## 2018-04-26 MED ORDER — PREDNISONE 10 MG PO TABS: ORAL_TABLET | ORAL | 0 refills | Status: DC

## 2018-04-26 MED ORDER — BUDESONIDE 0.5 MG/2ML IN SUSP: 0.5000 mg | Freq: Two times a day (BID) | RESPIRATORY_TRACT | 12 refills | Status: AC

## 2018-04-26 MED ORDER — CARVEDILOL 25 MG PO TABS: 25.0000 mg | ORAL_TABLET | Freq: Two times a day (BID) | ORAL | 0 refills | Status: AC

## 2018-04-26 MED ORDER — FUROSEMIDE 40 MG PO TABS: 40.0000 mg | ORAL_TABLET | Freq: Every day | ORAL | 1 refills | Status: DC

## 2018-04-26 MED ORDER — ARFORMOTEROL TARTRATE 15 MCG/2ML IN NEBU: 15.0000 ug | INHALATION_SOLUTION | Freq: Two times a day (BID) | RESPIRATORY_TRACT | 0 refills | Status: AC

## 2018-04-26 MED ORDER — ACETAMINOPHEN 325 MG PO TABS: 650.0000 mg | ORAL_TABLET | Freq: Four times a day (QID) | ORAL | Status: AC | PRN

## 2018-04-26 MED ORDER — MORPHINE SULFATE ER 15 MG PO TBCR: 15.0000 mg | EXTENDED_RELEASE_TABLET | Freq: Two times a day (BID) | ORAL | 0 refills | Status: AC

## 2018-04-26 MED ORDER — LORAZEPAM 0.5 MG PO TABS: 0.5000 mg | ORAL_TABLET | Freq: Four times a day (QID) | ORAL | 0 refills | Status: DC | PRN

## 2018-04-26 MED ORDER — GUAIFENESIN ER 600 MG PO TB12: 1200.0000 mg | ORAL_TABLET | Freq: Two times a day (BID) | ORAL | Status: AC

## 2018-04-26 MED ORDER — CLOPIDOGREL BISULFATE 75 MG PO TABS
75.0000 mg | ORAL_TABLET | Freq: Every day | ORAL | Status: DC
  Administered 2018-04-26: 75 mg via ORAL
  Filled 2018-04-26: qty 1

## 2018-04-26 MED ORDER — FUROSEMIDE 40 MG PO TABS: 40.0000 mg | ORAL_TABLET | Freq: Every day | ORAL | 11 refills | Status: DC

## 2018-04-26 MED ORDER — OXYCODONE HCL 5 MG PO TABS: 5.0000 mg | ORAL_TABLET | ORAL | 0 refills | Status: DC | PRN

## 2018-04-26 NOTE — Progress Notes (Signed)
Contacted SW about patient going to [facility] and being d/c since patient is stating that they do not have bed for her.

## 2018-04-26 NOTE — Progress Notes (Addendum)
CSW received notice from Santiago Glad at [facility] that the patient will need a negative COVID screening before she would be allowed to DC. CSW explained that the patient does not have any qualifying symptoms and that medical staff are not testing unnecessarily.   CSW informed the MD.   CSW called and spoke with assistance director Nathaniel Man. He stated that if we could find the patient another facility, but if not then the patient would need to go home.   CSW reached out to [place], [facility] [facility], [place], [place], [facility], and [facility].   CSW called and spoke with patients son and explained the situation. He believes that his mother needs a few more days in rehab. He is in agreement to complete medicaid application. He will take home health if we are not able to find his mother a facility.   CSW called and spoke with Tammy with [facility]. They are able to make a bed offer, if the family will complete the medicaid application.   CSW called and left a message with the patient's son about a possible bed offer.  Domenic Schwab, MSW, [place]

## 2018-04-26 NOTE — Progress Notes (Signed)
Progress Note  Patient Name: Andrea [name] Date of Encounter: 04/26/2018  Primary Cardiologist: Kirk Ruths, MD   Subjective   CP with cough but improved; dyspnea and cough improved  Inpatient Medications    Scheduled Meds: . amLODipine  5 mg Oral Daily  . arformoterol  15 mcg Nebulization BID  . atorvastatin  80 mg Oral QHS  . budesonide (PULMICORT) nebulizer solution  0.5 mg Nebulization BID  . busPIRone  15 mg Oral BID  . carbamide peroxide  5 drop Right EAR BID  . carvedilol  25 mg Oral BID WC  . divalproex  1,000 mg Oral Daily  . furosemide  40 mg Oral BID  . guaiFENesin  1,200 mg Oral BID  . hydrALAZINE  100 mg Oral Q8H  . insulin aspart  0-5 Units Subcutaneous QHS  . insulin aspart  0-9 Units Subcutaneous TID WC  . isosorbide dinitrate  20 mg Oral TID  . levothyroxine  200 mcg Oral Daily  . Melatonin  3 mg Oral QHS  . methylPREDNISolone (SOLU-MEDROL) injection  40 mg Intravenous Daily  . morphine  15 mg Oral BID  . multivitamin with minerals  1 tablet Oral Daily  . pantoprazole  40 mg Oral Daily  . sodium chloride flush  3 mL Intravenous Q12H   Continuous Infusions:  PRN Meds: acetaminophen, albuterol, benzonatate, chlorpheniramine-HYDROcodone, hydrALAZINE, loperamide, LORazepam, ondansetron **OR** ondansetron (ZOFRAN) IV, oxyCODONE   Vital Signs    Vitals:   04/26/18 0623 04/26/18 0827 04/26/18 0830 04/26/18 0903  BP: (!) 150/69   (!) 152/66  Pulse: 81   97  Resp: 16   18  Temp: 98 F (36.7 C)   97.8 F (36.6 C)  TempSrc: Oral   Oral  SpO2: 98% 98% 98% 96%  Weight:      Height:        Intake/Output Summary (Last 24 hours) at 04/26/2018 0935 Last data filed at 04/26/2018 0402 Gross per 24 hour  Intake 630 ml  Output 1000 ml  Net -370 ml   Last 3 Weights 04/26/2018 04/25/2018 04/24/2018  Weight (lbs) 160 lb 152 lb 9.6 oz 156 lb 8 oz  Weight (kg) 72.576 kg 69.219 kg 70.988 kg      Telemetry    Sinus with brief PAT- Personally Reviewed    Physical Exam   GEN: NAD WD/WN Neck: supple Cardiac: RRR Respiratory: CTA  GI: Soft, NT/ND MS: trace ankle edema Neuro:  No focal findings   Labs    Chemistry Recent Labs  Lab 04/21/18 0626  04/22/18 0520  04/23/18 0403 04/24/18 0328 04/25/18 0306 04/26/18 0513  NA 132*   < > 132*   < > 137 137 135 134*  K 6.4*   < > 5.6*   < > 4.8 4.1 4.1 4.1  CL 96*   < > 95*   < > 100 92* 96* 93*  CO2 28   < > 27   < > 29 29 29 30   GLUCOSE 176*   < > 160*   < > 163* 164* 181* 119*  BUN 49*   < > 50*   < > 54* 58* 67* 83*  CREATININE 1.92*   < > 1.90*   < > 1.90* 2.07* 2.32* 2.46*  CALCIUM 8.6*   < > 8.6*   < > 8.4* 8.8* 8.7* 8.6*  PROT 6.2*  --  6.1*  --   --   --  5.9*  --   ALBUMIN 2.2*  --  2.2*   < > 3.0* 3.8 3.7  --   AST 25  --  31  --   --   --  23  --   ALT 15  --  14  --   --   --  13  --   ALKPHOS 49  --  51  --   --   --  41  --   BILITOT 0.7  --  0.7  --   --   --  0.8  --   GFRNONAA 26*   < > 27*   < > 27* 24* 21* 20*  GFRAA 31*   < > 31*   < > 31* 28* 24* 23*  ANIONGAP 8   < > 10   < > 8 16* 10 11   < > = values in this interval not displayed.     Hematology Recent Labs  Lab 04/22/18 0520 04/24/18 0328 04/25/18 0306 04/26/18 0513  WBC 7.9 3.4* 7.6  --   RBC 3.29* 2.60* 3.23*  --   HGB 9.9* 7.4* 9.2*  --   HCT 29.6* 24.2* 29.4*  --   MCV 90.0 93.1 91.0  --   MCH 30.1 28.5 28.5  --   MCHC 33.4 30.6 31.3  --   RDW 14.0 14.1 14.0  --   PLT 426* 368 352 321    BNP Recent Labs  Lab 04/22/18 0520  BNP 2,365.5*     Patient Profile     68 year old female with past medical history of coronary artery disease status post PCI of LAD, ischemic cardiomyopathy improved on most recent echocardiogram, diabetes mellitus, hypertension, chronic stage IV kidney disease, seizure disorder for evaluation of chest pain and acute on chronic diastolic congestive heart failure.  Patient is status post PCI of LAD December 2019.  Admitted March 21 with dyspnea, chest pressure  and lower extremity edema.  Treated for pneumonia and also noted to have RSV.    Last echocardiogram February 2020 showed normal LV function.  Assessment & Plan    1 chest pain-continues to improve.  Symptoms increased with cough.  No plans for further ischemia evaluation.    2 acute on chronic diastolic congestive heart failure-volume status better and renal function worse; would hold lasix for 2 days and then resume 40 mg daily; needs BMET 3 days following DC.  3 coronary artery disease-status post recent PCI in December.    Patient was not on antiplatelet medications at time of admission as she stated she could not tolerate aspirin and question had a rash to Plavix.  She had some bleeding by report and Brilinta has been placed on hold.  She will be at risk for acute stent thrombosis if she is not taking antiplatelet medications.  Would resume plavix 75 mg daily at DC to see if she tolerates.  4 chronic stage IV kidney disease-lasix as outlined above; follow closely following DC  6 hypertension-continue present meds at DC.   7 pneumonia/COPD-per primary care.  For questions or updates, please contact [place] Please consult [email or web address] for contact info under        Signed, Kirk Ruths, MD  04/26/2018, 9:35 AM

## 2018-04-26 NOTE — Discharge Summary (Addendum)
Physician Discharge Summary  Andrea [name] GEX:[id number] DOB: [birth year] DOA: 04/11/2018  PCP: Medicine, [place] date: 04/11/2018 Discharge date: 04/26/2018  Admitted From: Home Disposition: Home  Recommendations for Outpatient Follow-up:  1. Follow up with PCP in 1-2 weeks 2. Please obtain BMP/CBC 4/7 please follow-up on the BMP and make sure her renal functions are stable or improving. 3. Please follow up with Dr. Stanford Breed  Home Health none Equipment/Devices: Oxygen at 2 L  Discharge Condition: Stable and improved CODE STATUS full code Diet recommendation: Cardiac  Brief/Interim Summary:68 year old female 68yo from [facility], In SNF with past medical history significant for ischemic cardiomyopathy andCHFw/ recent improved EF, 3V CADs/p PCI of LAD Dec 2019 / not a CABG candidate,HTN, Seizure disorder,DM, Hx of Breast Cancer and other comorbitieswho presented with resp distresswith sudden worsening of SOB, chest pressure, and leg edema. She was found to be RSV positive, and required noninvasive mechanical ventilation (BiPAP) and diuresis. She was also recently hospitalized from2/23 - 3/6 for CHF, AKI, and confusion from narcotics  Discharge Diagnoses:  Principal Problem:   Acute hypoxemic respiratory failure ([place]) Active Problems:   Chronic pain   Chest pain   Acute respiratory failure with hypoxia (HCC)   Anemia   Acute on chronic diastolic CHF (congestive heart failure) (HCC)   RSV (respiratory syncytial virus pneumonia)   DM type 2 (diabetes mellitus, type 2) (HCC)   CKD (chronic kidney disease), stage IV (HCC)  Acute Hypoxic Respiratory Failure - RSV pneumonia+ pulmonary edemain the setting of Acute on Chronic Diastolic CHFand chronic kidney disease stage III with probable mild AKI: -Presented with hypoxia, tachycardia, tachypnea placed on BiPAP; Now weaned off of BiPAP and is on 2 Liters  -Chest x-ray done on admission which revealed  bilateral pulmonary infiltrates suggestive of multilobar pneumonia -Respiratory panel positive for RSV -Completed 5 days of IV Zosyn  -Blood cultures x2 04/11/2018 showed NGTD  -Sputum Gram Stain showed Moderate WBC Present, Both PMN and Mononuclear with Abudant Yeast with Psuedohyphae and Cx showed Few Candida Glabrata and Few MRSA which was Resistant to Ciprofloxacin, Erythromycin, and Oxacillin; ? if she is colonized with MRSA;  -As per prior documentation, infectious disease team is of the impression that the Candida glabrata is insignificant and the MRSA is likely a colonization as she has been afebrile with no leukocytosis and no worsening hypoxemic symptoms -Patient is requiring only 2 L of supplemental oxygen via nasal cannula.  -CT scan of the chest done without contrast on 04/22/2018 is suggestive of likely congestive heart failure, however, atypical infection cannot be ruled out.  Acute on Chronic Diastolic CHF  -Hx ofrecoveredSystolic CHF Dec 0102 (EF 30-35% TTE) -EF 03/05/18 noted EF 55-60% w/ mild LVH, normal RV fxn, and no valvular issues  -lasix changed to po 40 mg QD START LASIX 04/30/2018.CHECK BMP 04/28/2018  CAD -S/p PCI Dec 2019 (prox LAD - 2 other lesions not amenable - not CABG candidate)  -Cardiology has advised trial of Brilinta. Patient was started on Brilinta then she had bleeding from her vagina, from her nose and coughing up blood-tinged phlegm.  Brilinta was on hold all the symptoms improved we will discharge her on Plavix otherwise she is prone to get thrombosis of her stents.  She did complain of rash with Plavix.  Benefits outweigh the risk of continuation of Plavix. MUST CONTINUE PLAVIX EVEN THOUGH SHE GETS RASH SINCE THE RISK OF THROMBOSIS OF STENT IS HIGH.SHE HAD BLEEDING FROM NOSE AND VAGINA WITH BRILLINTA. Acute  Kidney Injury on CKD Stage 3/4 -Baseline creatinine 1.86 with GFR of 28  -Serum creatinine isup today to 2.4. -Monitor renal functions while  on Lasix.  Please recheck her renal functions in 2 to 3 days.  Marland KitchenUNCONTrolled HTN continue Coreg, Lasix, hydralazine, Imdur.  Diarrhea resolved.  Normocytic anemiaof unclear etiology /Anemia of Chronic Kidney Disease Stage 4 patient received 3 units of packed RBCs.  Hemoglobin remained stable. -CT abdom/pelvis to r/o RPH showed No evidence of intraabdominal/pelvic hemorrhage or explanation for decreased hemoglobin.  Type 2 diabetes complicated by CKD -ZOX0R 03/15/2018 was 6.4  -Continue Lantus  Hyperkalemia resolved   Chronic Back Pain Patient takes long-acting morphine and short-acting narcotics at the nursing home continue.  Hx of Breast CA s/p Bilateral mastectomies + chemo -Will need outpatient follow up with PCP   Hypothyroidism -Continue levothyroxine.  History of Seizures/Seizure Disorder and migraines -Continue Divalproex 1000 mg p.o. daily -Has not had a Seizure in 10 years   Hard of Hearing/Ear Congestion -Continue with Debrox -Complete course of Ciprodex otic.   Dyslipidemia -C/w with Atorvastatin 80 mg po qHS  Insomnia -Continue with Melatonin 3 mg po qHS  Generalized Anxiety Disorder -Continue with BuSpirone 15 mg po BID   GERD continue PPI.  Bleeding from the labia resolved after applying pressure she must follow-up with her gynecologist as an outpatient. -Nutrition Problem: Inadequate oral intake Etiology: decreased appetite    Signs/Symptoms: per patient/family report, meal completion < 50%     Interventions: Magic cup, MVI, Hormel Shake  Estimated body mass index is 27.46 kg/m as calculated from the following:   Height as of this encounter: 5\' 4"  (1.626 m).   Weight as of this encounter: 72.6 kg.  Discharge Instructions   Allergies as of 04/26/2018      Reactions   Ticagrelor Rash   Aspartame And Phenylalanine Hives, Diarrhea   Aspirin Nausea And Vomiting   Maxzide [hydrochlorothiazide W-triamterene] Swelling   Fluid  retention   Metformin And Related Diarrhea   Nsaids Nausea And Vomiting   Other Other (See Comments)   All steroids produce psychosis per pt Artificial sweeteners produce nausea and upset stomach.   Pravachol [pravastatin] Other (See Comments)   unknown   Spironolactone    Stadol [butorphanol] Other (See Comments)   Toradol, etc And related- hallucinations   Toradol [ketorolac Tromethamine] Other (See Comments)   hallucinations   Vistaril [hydroxyzine Hcl] Other (See Comments)   unknown   Erythromycin Itching, Rash   Morphine And Related Hives, Rash   Iv morphine.      Medication List    STOP taking these medications   amLODipine 5 MG tablet Commonly known as:  NORVASC   carbamide peroxide 6.5 % OTIC solution Commonly known as:  DEBROX   ciprofloxacin 500 MG tablet Commonly known as:  CIPRO   diclofenac sodium 1 % Gel Commonly known as:  VOLTAREN   menthol-cetylpyridinium 3 MG lozenge Commonly known as:  CEPACOL   polyethylene glycol packet Commonly known as:  MIRALAX / GLYCOLAX   promethazine 25 MG/ML injection Commonly known as:  PHENERGAN     TAKE these medications   acetaminophen 325 MG tablet Commonly known as:  TYLENOL Take 2 tablets (650 mg total) by mouth every 6 (six) hours as needed for mild pain, fever or headache.   arformoterol 15 MCG/2ML Nebu Commonly known as:  BROVANA Take 2 mLs (15 mcg total) by nebulization 2 (two) times daily.   atorvastatin 80 MG tablet Commonly known  as:  LIPITOR Take 1 tablet (80 mg total) by mouth at bedtime.   budesonide 0.5 MG/2ML nebulizer solution Commonly known as:  PULMICORT Take 2 mLs (0.5 mg total) by nebulization 2 (two) times daily.   busPIRone 15 MG tablet Commonly known as:  BUSPAR Take 15 mg by mouth 2 (two) times daily.   carvedilol 25 MG tablet Commonly known as:  COREG Take 1 tablet (25 mg total) by mouth 2 (two) times daily with a meal. What changed:    medication strength  how much to  take   clopidogrel 75 MG tablet Commonly known as:  Plavix Take 1 tablet (75 mg total) by mouth daily.   divalproex 500 MG DR tablet Commonly known as:  DEPAKOTE Take 1,000 mg by mouth daily.   furosemide 40 MG tablet Commonly known as:  Lasix Take 1 tablet (40 mg total) by mouth daily. What changed:    medication strength  how much to take   guaiFENesin 600 MG 12 hr tablet Commonly known as:  MUCINEX Take 2 tablets (1,200 mg total) by mouth 2 (two) times daily.   hydrALAZINE 100 MG tablet Commonly known as:  APRESOLINE Take 1 tablet (100 mg total) by mouth every 8 (eight) hours.   insulin glargine 100 UNIT/ML injection Commonly known as:  LANTUS Inject 0.1 mLs (10 Units total) into the skin at bedtime.   isosorbide mononitrate 30 MG 24 hr tablet Commonly known as:  IMDUR Take 1 tablet (30 mg total) by mouth daily.   levothyroxine 200 MCG tablet Commonly known as:  SYNTHROID, LEVOTHROID Take 1 tablet (200 mcg total) by mouth at bedtime. What changed:  when to take this   LORazepam 0.5 MG tablet Commonly known as:  ATIVAN Take 1 tablet (0.5 mg total) by mouth every 6 (six) hours as needed for anxiety.   Melatonin 5 MG Tabs Take 5 mg by mouth at bedtime.   morphine 15 MG 12 hr tablet Commonly known as:  MS CONTIN Take 1 tablet (15 mg total) by mouth 2 (two) times daily.   multivitamin with minerals Tabs tablet Take 1 tablet by mouth daily.   omeprazole 20 MG capsule Commonly known as:  PRILOSEC Take 20 mg by mouth daily.   oxyCODONE 5 MG immediate release tablet Commonly known as:  Oxy IR/ROXICODONE Take 1-2 tablets (5-10 mg total) by mouth every 4 (four) hours as needed for severe pain.   predniSONE 10 MG tablet Commonly known as:  DELTASONE Take 4 tablets for the first 4 days then 3 tablets for the following 4 days then 2 tablets for the following 4 days and then 1 tablet daily till done   senna-docusate 8.6-50 MG tablet Commonly known as:   Senokot-S Take 2 tablets by mouth 2 (two) times daily.       Contact information for follow-up providers    Medicine, [facility] Family Follow up.   Specialty:  Family Medicine Contact information: [place] [address] [phone]        Lelon Perla, MD .   Specialty:  Cardiology Contact information: [address] [place] [address] [phone]            Contact information for after-discharge care    Destination    [facility] Preferred SNF .   Service:  Skilled Nursing Contact information: [address] [place] [address] [place] [phone]  Allergies  Allergen Reactions  . Ticagrelor Rash  . Aspartame And Phenylalanine Hives and Diarrhea  . Aspirin Nausea And Vomiting  . Maxzide [Hydrochlorothiazide W-Triamterene] Swelling    Fluid retention  . Metformin And Related Diarrhea  . Nsaids Nausea And Vomiting  . Other Other (See Comments)    All steroids produce psychosis per pt Artificial sweeteners produce nausea and upset stomach.  Gregary Cromer [Pravastatin] Other (See Comments)    unknown  . Spironolactone   . Stadol [Butorphanol] Other (See Comments)    Toradol, etc And related- hallucinations  . Toradol [Ketorolac Tromethamine] Other (See Comments)    hallucinations  . Vistaril [Hydroxyzine Hcl] Other (See Comments)    unknown  . Erythromycin Itching and Rash  . Morphine And Related Hives and Rash    Iv morphine.    Consultations: Cardiology  Procedures/Studies: Ct Abdomen Pelvis Wo Contrast  Result Date: 04/18/2018 CLINICAL DATA:  Diffuse abdominal pain. Decreased hemoglobin. Rule out retroperitoneal hemorrhage. Prior hysterectomy. Appendectomy. Cholecystectomy. Bilateral mastectomy. EXAM: CT ABDOMEN AND PELVIS WITHOUT CONTRAST TECHNIQUE: Multidetector CT imaging of the abdomen and pelvis was performed following the standard protocol  without IV contrast. COMPARISON:  Plain films 03/22/2018.  CT of 03/01/2018. FINDINGS: Lower chest: Left greater than right lower lobe airspace disease. Increased compared to 03/01/2018. Bilateral breast implants. Mild cardiomegaly with multivessel coronary artery atherosclerosis. Small bilateral pleural effusions. Hepatobiliary: Normal liver. Cholecystectomy, without biliary ductal dilatation. Pancreas: Normal, without mass or ductal dilatation. Spleen: Normal in size, without focal abnormality. Adrenals/Urinary Tract: Normal adrenal glands. Left kidney is pelvic in position. Has a similar atypical appearance with suggestion of 2 moieties. Example images 52 and 60 of series 3. No hydronephrosis. Interpolar right renal cyst. No bladder calculi. Stomach/Bowel: Normal stomach, without wall thickening. Colonic stool burden suggests constipation. Normal small bowel. Vascular/Lymphatic: Aortic and branch vessel atherosclerosis. No abdominopelvic adenopathy. Reproductive: Hysterectomy.  No adnexal mass. Other: No free pelvic fluid. There is small volume perihepatic ascites. No free intraperitoneal air. No evidence of intraperitoneal or retroperitoneal hemorrhage. Musculoskeletal: Anasarca. Superior endplate compression deformity is mild at L3. Similar. IMPRESSION: 1. No evidence of intraabdominal/pelvic hemorrhage or explanation for decreased hemoglobin. 2. Small bilateral pleural effusions with bibasilar collapse/consolidative change. Small volume abdominal ascites. 3. Upper pelvic left kidney, without hydronephrosis. 4. Coronary artery atherosclerosis. Aortic Atherosclerosis (ICD10-I70.0). Electronically Signed   By: Abigail Miyamoto M.D.   On: 04/18/2018 11:07   Dg Chest 2 View  Result Date: 04/19/2018 CLINICAL DATA:  Respiratory distress.  Shortness of breath. EXAM: CHEST - 2 VIEW COMPARISON:  April 17, 2018 FINDINGS: Cardiomegaly. Bilateral pleural effusions with underlying opacities. No other acute abnormalities.  IMPRESSION: 1. Pleural effusions. Underlying opacities could be atelectasis or consolidation/infiltrate such as pneumonia. Electronically Signed   By: Dorise Bullion III M.D   On: 04/19/2018 17:57   Dg Chest 2 View  Result Date: 04/17/2018 CLINICAL DATA:  Pt states having chest pains and SOB Order for multifocal pnemonia EXAM: CHEST - 2 VIEW COMPARISON:  04/12/2018 FINDINGS: Some increase in diffuse bilateral interstitial edema or infiltrates. Airspace opacities in the lung bases as before. The patchy airspace disease in the left upper lung is perhaps slightly less conspicuous. Heart size upper limits normal. Aortic Atherosclerosis (ICD10-170.0). Small bilateral pleural effusions. Visualized bones unremarkable. IMPRESSION: 1. Some interval worsening of bilateral edema/infiltrates. 2. Small bilateral pleural effusions with bibasilar atelectasis/consolidation. Electronically Signed   By: Lucrezia Europe M.D.   On: 04/17/2018 07:40   Ct Chest Wo Contrast  Result Date: 04/22/2018 CLINICAL DATA:  68 year old female with history of acute hypoxic respiratory failure. RSV pneumonia. Pulmonary edema. History of congestive heart failure. EXAM: CT CHEST WITHOUT CONTRAST TECHNIQUE: Multidetector CT imaging of the chest was performed following the standard protocol without IV contrast. COMPARISON:  Chest CT 01/17/2018. FINDINGS: Cardiovascular: Heart size is mildly enlarged. There is no significant pericardial fluid, thickening or pericardial calcification. There is aortic atherosclerosis, as well as atherosclerosis of the great vessels of the mediastinum and the coronary arteries, including calcified atherosclerotic plaque in the left anterior descending and right coronary arteries. Coronary artery stent in the proximal left anterior descending coronary artery. Mediastinum/Nodes: No pathologically enlarged mediastinal or hilar lymph nodes. Please note that accurate exclusion of hilar adenopathy is limited on noncontrast CT  scans. Esophagus is unremarkable in appearance. No axillary lymphadenopathy. Lungs/Pleura: Widespread areas of ground-glass attenuation are noted throughout the lungs bilaterally, relatively symmetric and predominantly peribronchovascular in distribution. This is associated with extensive interlobular septal thickening. A few areas of more nodular ground-glass attenuation are also noted throughout the periphery of the lungs. Dependent areas of subsegmental atelectasis are noted in the lower lobes of the lungs bilaterally. Moderate bilateral pleural effusions lying dependently. Upper Abdomen: Aortic atherosclerosis.  Status post cholecystectomy. Musculoskeletal: Chronic appearing compression fractures of T7 and T8, most severe at T8 where there is 20% loss of anterior vertebral body height. There are no aggressive appearing lytic or blastic lesions noted in the visualized portions of the skeleton. IMPRESSION: 1. Appearance of the thorax is most suggestive of congestive heart failure, as detailed above. 2. Some of the pulmonary findings could also be associated with atypical infection, including viral pneumonia, in this patient with documented history of RSV. 3. Aortic atherosclerosis, in addition to 2 vessel coronary artery disease. Please note that although the presence of coronary artery calcium documents the presence of coronary artery disease, the severity of this disease and any potential stenosis cannot be assessed on this non-gated CT examination. Assessment for potential risk factor modification, dietary therapy or pharmacologic therapy may be warranted, if clinically indicated. Aortic Atherosclerosis (ICD10-I70.0). Electronically Signed   By: Vinnie Langton M.D.   On: 04/22/2018 09:54   Dg Chest Port 1 View  Result Date: 04/22/2018 CLINICAL DATA:  Shortness of breath EXAM: PORTABLE CHEST 1 VIEW COMPARISON:  04/21/2018 FINDINGS: Heart is borderline enlarged. Bilateral perihilar and lower lobe airspace  opacities with diffuse interstitial prominence. Small left effusion. Findings likely reflect edema/CHF. No acute bony abnormality. No change. IMPRESSION: No significant change in CHF pattern since prior study. Electronically Signed   By: Rolm Baptise M.D.   On: 04/22/2018 07:53   Dg Chest Port 1 View  Result Date: 04/21/2018 CLINICAL DATA:  Shortness of breath, history breast cancer, CHF, diabetes mellitus, hypertension EXAM: PORTABLE CHEST 1 VIEW COMPARISON:  Portable exam 0718 hours compared to 04/20/2018 FINDINGS: Mild enlargement of cardiac silhouette with pulmonary vascular congestion. Mediastinal contours normal Interstitial infiltrates bilaterally, slightly increased on LEFT, favor pulmonary edema and CHF. Small LEFT pleural effusion. No pneumothorax. Bones demineralized. IMPRESSION: Enlargement of cardiac silhouette with vascular congestion and BILATERAL interstitial infiltrates favoring pulmonary edema and CHF. Small LEFT pleural effusion. Electronically Signed   By: Lavonia Dana M.D.   On: 04/21/2018 08:14   Dg Chest Port 1 View  Result Date: 04/20/2018 CLINICAL DATA:  Shortness of breath, history diabetes mellitus, hypertension, CHF, breast cancer EXAM: PORTABLE CHEST 1 VIEW COMPARISON:  Portable exam 0703 hours compared to 04/19/2018 FINDINGS: Mildly lordotic  positioning. Borderline enlargement of cardiac silhouette. Mediastinal contours normal. Progressive interstitial infiltrates in RIGHT lung versus earlier study. Atelectasis versus consolidation LEFT lower lobe, minimally improved. No definite pleural effusion or pneumothorax. Bones demineralized. IMPRESSION: LEFT lower lobe atelectasis versus consolidation, minimally improved. Increased interstitial infiltrates in the RIGHT lung since previous exam. Electronically Signed   By: Lavonia Dana M.D.   On: 04/20/2018 08:15   Dg Chest Port 1 View  Result Date: 04/12/2018 CLINICAL DATA:  68 year old female with history of severe shortness of  breath. EXAM: PORTABLE CHEST 1 VIEW COMPARISON:  Multiple priors, most recently chest x-ray 04/11/2018. FINDINGS: Increasing confluent patchy airspace opacities are noted in the lungs bilaterally, most severe at the lung bases bilaterally, and in the left suprahilar region. Findings are very asymmetrically distributed, and not compatible with pulmonary edema. There is a diffuse interstitial prominence also noted, which could be indicative of a background of mild interstitial pulmonary edema. Moderate right and small left pleural effusions. Mild cardiomegaly. Upper mediastinal contours are within normal limits. IMPRESSION: 1. Although there may be a background of pulmonary edema related to congestive heart failure, there is now clear evidence of multifocal asymmetrically distributed airspace consolidation which has significantly worsened compared to prior examinations, highly concerning for developing multilobar pneumonia. 2. Moderate right and small left pleural effusions. Electronically Signed   By: Vinnie Langton M.D.   On: 04/12/2018 04:27   Dg Chest Portable 1 View  Result Date: 04/11/2018 CLINICAL DATA:  Shortness of breath. EXAM: PORTABLE CHEST 1 VIEW COMPARISON:  03/17/2018 FINDINGS: Cardiomediastinal silhouette is enlarged. Mediastinal contours appear intact. There is increasing airspace opacity in the right lung base. Interstitial pulmonary edema. Osseous structures are without acute abnormality. Soft tissues are grossly normal. IMPRESSION: 1. Confluent airspace opacity in the right lung base may represent increasing right pleural effusion with compressive atelectasis or infectious airspace consolidation. Possible peribronchial airspace consolidation in the left lower lobe. 2. Enlarged cardiac silhouette. 3. Interstitial pulmonary edema. Electronically Signed   By: Fidela Salisbury M.D.   On: 04/11/2018 04:02   (Echo, Carotid, EGD, Colonoscopy, ERCP)    Subjective: Patient resting in bed  denies any new complaints   Discharge Exam: Vitals:   04/26/18 0830 04/26/18 0903  BP:  (!) 152/66  Pulse:  97  Resp:  18  Temp:  97.8 F (36.6 C)  SpO2: 98% 96%   Vitals:   04/26/18 0623 04/26/18 0827 04/26/18 0830 04/26/18 0903  BP: (!) 150/69   (!) 152/66  Pulse: 81   97  Resp: 16   18  Temp: 98 F (36.7 C)   97.8 F (36.6 C)  TempSrc: Oral   Oral  SpO2: 98% 98% 98% 96%  Weight:      Height:        General: Pt is alert, awake, not in acute distress Cardiovascular: RRR, S1/S2 +, no rubs, no gallops Respiratory: Few scattered rhonchi bilaterally, no wheezing, no rhonchi Abdominal: Soft, NT, ND, bowel sounds + Extremities: no edema, no cyanosis    The results of significant diagnostics from this hospitalization (including imaging, microbiology, ancillary and laboratory) are listed below for reference.     Microbiology: No results found for this or any previous visit (from the past 240 hour(s)).   Labs: BNP (last 3 results) Recent Labs    04/11/18 0341 04/17/18 0202 04/22/18 0520  BNP 1,045.0* 3,208.7* 4,765.4*   Basic Metabolic Panel: Recent Labs  Lab 04/20/18 6503 04/21/18 5465  04/22/18 0520  04/22/18 1931  04/22/18 2330 04/23/18 0403 04/24/18 0328 04/25/18 0306 04/26/18 0513  NA 134* 132*   < > 132*  --  137  --  137 137 135 134*  K 5.4* 6.4*   < > 5.6*   < > 5.2*  5.2* 4.9 4.8 4.1 4.1 4.1  CL 99 96*   < > 95*  --  98  --  100 92* 96* 93*  CO2 28 28   < > 27  --  31  --  29 29 29 30   GLUCOSE 113* 176*   < > 160*  --  164*  --  163* 164* 181* 119*  BUN 46* 49*   < > 50*  --  51*  --  54* 58* 67* 83*  CREATININE 1.67* 1.92*   < > 1.90*  --  1.95*  --  1.90* 2.07* 2.32* 2.46*  CALCIUM 8.1* 8.6*   < > 8.6*  --  8.5*  --  8.4* 8.8* 8.7* 8.6*  MG 2.1 2.1  --  2.0  --   --   --  1.9 2.1  --   --   PHOS 3.2 3.5  --  4.0  --  4.0  --  4.1 4.8*  --   --    < > = values in this interval not displayed.   Liver Function Tests: Recent Labs  Lab  04/20/18 0641 04/21/18 0626 04/22/18 0520 04/22/18 1931 04/23/18 0403 04/24/18 0328 04/25/18 0306  AST 26 25 31   --   --   --  23  ALT 12 15 14   --   --   --  13  ALKPHOS 45 49 51  --   --   --  41  BILITOT 0.4 0.7 0.7  --   --   --  0.8  PROT 5.2* 6.2* 6.1*  --   --   --  5.9*  ALBUMIN 2.0* 2.2* 2.2* 2.6* 3.0* 3.8 3.7   No results for input(s): LIPASE, AMYLASE in the last 168 hours. No results for input(s): AMMONIA in the last 168 hours. CBC: Recent Labs  Lab 04/20/18 0641 04/21/18 0448 04/22/18 0520 04/24/18 0328 04/25/18 0306 04/26/18 0513  WBC 5.3 4.2 7.9 3.4* 7.6  --   NEUTROABS 2.5 3.3 6.8 2.7 6.7  --   HGB 8.4* 9.1* 9.9* 7.4* 9.2*  --   HCT 27.3* 28.5* 29.6* 24.2* 29.4*  --   MCV 89.8 89.9 90.0 93.1 91.0  --   PLT 226 304 426* 368 352 321   Cardiac Enzymes: No results for input(s): CKTOTAL, CKMB, CKMBINDEX, TROPONINI in the last 168 hours. BNP: Invalid input(s): POCBNP CBG: Recent Labs  Lab 04/25/18 0604 04/25/18 1138 04/25/18 1640 04/25/18 2128 04/26/18 0621  GLUCAP 182* 173* 190* 288* 111*   D-Dimer Recent Labs    04/26/18 0513  DDIMER 2.69*   Hgb A1c No results for input(s): HGBA1C in the last 72 hours. Lipid Profile No results for input(s): CHOL, HDL, LDLCALC, TRIG, CHOLHDL, LDLDIRECT in the last 72 hours. Thyroid function studies No results for input(s): TSH, T4TOTAL, T3FREE, THYROIDAB in the last 72 hours.  Invalid input(s): FREET3 Anemia work up No results for input(s): VITAMINB12, FOLATE, FERRITIN, TIBC, IRON, RETICCTPCT in the last 72 hours. Urinalysis    Component Value Date/Time   COLORURINE YELLOW 02/25/2018 2316   APPEARANCEUR HAZY (A) 02/25/2018 2316   LABSPEC 1.014 02/25/2018 2316   PHURINE 5.0 02/25/2018 2316   GLUCOSEU NEGATIVE 02/25/2018 2316  HGBUR SMALL (A) 02/25/2018 2316   BILIRUBINUR NEGATIVE 02/25/2018 2316   KETONESUR NEGATIVE 02/25/2018 2316   PROTEINUR >=300 (A) 02/25/2018 2316   UROBILINOGEN 1.0 12/02/2013  1612   NITRITE NEGATIVE 02/25/2018 2316   LEUKOCYTESUR SMALL (A) 02/25/2018 2316   Sepsis Labs Invalid input(s): PROCALCITONIN,  WBC,  LACTICIDVEN Microbiology No results found for this or any previous visit (from the past 240 hour(s)).   Time coordinating discharge: 33 minutes  SIGNED:   Georgette Shell, MD  [facility] 04/26/2018, 9:12 AM Pager   If 7PM-7AM, please contact night-coverage [email or web address] Password [email or web address]

## 2018-04-26 NOTE — TOC Transition Note (Signed)
Transition of Care [facility]) - CM/SW Discharge Note   Patient Details  Name: Andrea [name] MRN: [id number] Date of Birth: 12-05-1950  Transition of Care [facility]) CM/SW Contact:  Gelene Mink, [place] Phone Number: 04/26/2018, 2:55 PM   Clinical Narrative:     Nurse to call report to [phone] and patient will report to room 164.     Final next level of care: Skilled Nursing Facility Barriers to Discharge: No Barriers Identified   Patient Goals and CMS Choice Patient states their goals for this hospitalization and ongoing recovery are:: To get to feeling better CMS Medicare.gov Compare Post Acute Care list provided to:: Patient Represenative (must comment)(Pt son) Choice offered to / list presented to : Adult Children  Discharge Placement   Existing PASRR number confirmed : 04/12/18          Patient chooses bed at: Other - please specify in the comment section below:([facility]) Patient to be transferred to facility by: [place] Name of family member notified: [name] Patient and family notified of of transfer: 04/26/18  Discharge Plan and Services In-house Referral: NA Discharge Planning Services: NA Post Acute Care Choice: NA          DME Arranged: N/A DME Agency: NA HH Arranged: NA HH Agency: NA   Social Determinants of Health (SDOH) Interventions     Readmission Risk Interventions Readmission Risk Prevention Plan 04/12/2018  Transportation Screening Complete  Medication Review Press photographer) Complete  PCP or Specialist appointment within 3-5 days of discharge Complete  HRI or [place] Complete  SW Recovery Care/Counseling Consult Complete  [place] Complete  Some recent data might be hidden

## 2018-04-26 NOTE — Progress Notes (Signed)
Andrea [name] was able to make a bed offer and patient son was in agreement. We will start insurance authorization on Monday.   CSW will continue to follow.   Domenic Schwab, MSW, [place]

## 2018-04-26 NOTE — Progress Notes (Signed)
Per Verbal order given by MD Rodena Piety do not give morning Lasix. Pt will be switched to po instead IV. Medication not administered per order.   Andrea Deskins, RN

## 2018-04-26 NOTE — Progress Notes (Signed)
Lat BM 4/1, patient stating she is not constipated and she does not want stool softener, she normally goes every 10 days.

## 2018-04-26 NOTE — Progress Notes (Signed)
PROGRESS NOTE    Andrea [name]  GYI:[id number] DOB: February 17, 1950 DOA: 04/11/2018 PCP: Medicine, [facility] [facility]   Brief Narrative:68 year old female 68yo from [facility] with past medical history significant for ischemic cardiomyopathy andCHFw/ recent improved EF, 3V CADs/p PCI of LAD Dec 2019 / not a CABG candidate,HTN, Seizure disorder,DM, Hx of Breast Cancer and other comorbitieswho presented with resp distresswith sudden worsening of SOB, chest pressure, and leg edema. She was found to be RSV positive, and required noninvasive mechanical ventilation (BiPAP) and diuresis. She was also recently hospitalized from2/23 - 3/6 for CHF, AKI, and confusion from narcotics  Assessment & Plan:   Principal Problem:   Acute hypoxemic respiratory failure ([place]) Active Problems:   Chronic pain   Chest pain   Acute respiratory failure with hypoxia (HCC)   Anemia   Acute on chronic diastolic CHF (congestive heart failure) (HCC)   RSV (respiratory syncytial virus pneumonia)   DM type 2 (diabetes mellitus, type 2) (HCC)   CKD (chronic kidney disease), stage IV (HCC)  Acute Hypoxic Respiratory Failure - RSV pneumonia+ pulmonary edemain the setting of Acute on Chronic Diastolic CHFand chronic kidney disease stage III with probable mild AKI: -Presented with hypoxia, tachycardia, tachypnea placed on BiPAP; Now weaned off of BiPAP and is on 2 Liters  -Chest x-ray done on admission which revealed bilateral pulmonary infiltrates suggestive of multilobar pneumonia -Respiratory panel positive for RSV -Completed 5 days of IV Zosyn  -Blood cultures x2 04/11/2018 showed NGTD  -Sputum Gram Stain showed Moderate WBC Present, Both PMN and Mononuclear with Abudant Yeast with Psuedohyphae and Cx showed Few Candida Glabrata and Few MRSA which was Resistant to Ciprofloxacin, Erythromycin, and Oxacillin; ? if she is colonized with MRSA;  -As per prior documentation, infectious disease team is  of the impression that the Candida glabrata is insignificant and the MRSA is likely a colonization as she has been afebrile with no leukocytosis and no worsening hypoxemic symptoms -Patient is requiring only 2 L of supplemental oxygen via nasal cannula.  -CT scan of the chest done without contrast on 04/22/2018 is suggestive of likely congestive heart failure, however, atypical infection cannot be ruled out.  Acute on Chronic Diastolic CHF  -Hx ofrecoveredSystolic CHF Dec 3500 (EF 30-35% TTE) -EF 03/05/18 noted EF 55-60% w/ mild LVH, normal RV fxn, and no valvular issues  -lasix stopped by cardiology plan is to hold Lasix for 3 days and restart Lasix at 40 mg daily.  Check a BMP in 2 to 3 days after discharge.  Since she is not being discharged today I will check a BMP tomorrow.  CAD -S/p PCI Dec 2019 (prox LAD - 2 other lesions not amenable - not CABG candidate)  -Cardiology has advised trial of Brilinta. Patient was started on Brilinta then she had bleeding from her vagina, from her nose and coughing up blood-tinged phlegm.  Brilinta was on hold all the symptoms improved we will discharge her on Plavix otherwise she is prone to get thrombosis of her stents.  She did complain of rash with Plavix.  Benefits outweigh the risk of continuation of Plavix. I will start Plavix today.  Acute Kidney Injury on CKD Stage 3/4 -Baseline creatinine 1.86 with GFR of 28  -Serum creatinine isup today to 2.4. -Hold Lasix for 3 days and restarted 40 mg daily.  Marland KitchenUNCONTrolled HTN continue Coreg, Lasix, hydralazine, Imdur.  Diarrhea resolved.  Normocytic anemiaof unclear etiology /Anemia of Chronic Kidney Disease Stage 4 patient received 3 units of packed  RBCs.  Hemoglobin remained stable. -CT abdom/pelvis to r/o RPH showed No evidence of intraabdominal/pelvic hemorrhage or explanation for decreased hemoglobin.  Type 2 diabetes complicated by CKD -KGM0N 03/15/2018 was 6.4  -Continue Lantus   Hyperkalemia resolved   Chronic Back Pain Patient takes long-acting morphine and short-acting narcotics at the nursing home continue.  Hx of Breast CA s/p Bilateral mastectomies + chemo -Will need outpatient follow up with PCP   Hypothyroidism -Continue levothyroxine.  History of Seizures/Seizure Disorder and migraines -Continue Divalproex 1000 mg p.o. daily -Has not had a Seizure in 10 years   Hard of Hearing/Ear Congestion -Continue with Debrox -Complete course of Ciprodex otic.   Dyslipidemia -C/w with Atorvastatin 80 mg po qHS  Insomnia -Continue with Melatonin 3 mg po qHS  Generalized Anxiety Disorder -Continue with BuSpirone 15 mg po BID   GERD continue PPI.  Bleeding from the labia resolved after applying pressure she must follow-up with her gynecologist as an outpatient  DVT prophylaxis:SCDs  Code Status:FULL CODE  Family Communication:Discussed in detail with the patient Disposition Plan:SNF   Consultants:  Discussed the Case with ID Dr. Megan Salon  Cardiology Dr. Stanford Breed   Procedures: None Antimicrobials:   Nutrition Problem: Inadequate oral intake Etiology: decreased appetite     Signs/Symptoms: per patient/family report, meal completion < 50%    Interventions: Magic cup, MVI, Hormel Shake  Estimated body mass index is 27.46 kg/m as calculated from the following:   Height as of this encounter: 5\' 4"  (1.626 m).   Weight as of this encounter: 72.6 kg.    Subjective: Patient has no new complaints anxious to be discharged denies any chest pain or shortness of breath.  Objective: Vitals:   04/26/18 0623 04/26/18 0827 04/26/18 0830 04/26/18 0903  BP: (!) 150/69   (!) 152/66  Pulse: 81   97  Resp: 16   18  Temp: 98 F (36.7 C)   97.8 F (36.6 C)  TempSrc: Oral   Oral  SpO2: 98% 98% 98% 96%  Weight:      Height:        Intake/Output Summary (Last 24 hours) at 04/26/2018 1133 Last data filed at 04/26/2018 1000  Gross per 24 hour  Intake 750 ml  Output 1000 ml  Net -250 ml   Filed Weights   04/24/18 0511 04/25/18 0345 04/26/18 0402  Weight: 71 kg 69.2 kg 72.6 kg    Examination:  General exam: Appears calm and comfortable  Respiratory system: Coarse breath sounds to auscultation. Respiratory effort normal. Cardiovascular system: S1 & S2 heard, RRR. No JVD, murmurs, rubs, gallops or clicks. No pedal edema. Gastrointestinal system: Abdomen is nondistended, soft and nontender. No organomegaly or masses felt. Normal bowel sounds heard. Central nervous system: Alert and oriented. No focal neurological deficits. Extremities: Symmetric 5 x 5 power. Skin: No rashes, lesions or ulcers Psychiatry: Judgement and insight appear normal. Mood & affect appropriate.     Data Reviewed: I have personally reviewed following labs and imaging studies  CBC: Recent Labs  Lab 04/20/18 0641 04/21/18 0448 04/22/18 0520 04/24/18 0328 04/25/18 0306 04/26/18 0513  WBC 5.3 4.2 7.9 3.4* 7.6  --   NEUTROABS 2.5 3.3 6.8 2.7 6.7  --   HGB 8.4* 9.1* 9.9* 7.4* 9.2*  --   HCT 27.3* 28.5* 29.6* 24.2* 29.4*  --   MCV 89.8 89.9 90.0 93.1 91.0  --   PLT 226 304 426* 368 352 027   Basic Metabolic Panel: Recent Labs  Lab 04/20/18  9381 04/21/18 0175  04/22/18 0520  04/22/18 1931 04/22/18 2330 04/23/18 0403 04/24/18 0328 04/25/18 0306 04/26/18 0513  NA 134* 132*   < > 132*  --  137  --  137 137 135 134*  K 5.4* 6.4*   < > 5.6*   < > 5.2*  5.2* 4.9 4.8 4.1 4.1 4.1  CL 99 96*   < > 95*  --  98  --  100 92* 96* 93*  CO2 28 28   < > 27  --  31  --  29 29 29 30   GLUCOSE 113* 176*   < > 160*  --  164*  --  163* 164* 181* 119*  BUN 46* 49*   < > 50*  --  51*  --  54* 58* 67* 83*  CREATININE 1.67* 1.92*   < > 1.90*  --  1.95*  --  1.90* 2.07* 2.32* 2.46*  CALCIUM 8.1* 8.6*   < > 8.6*  --  8.5*  --  8.4* 8.8* 8.7* 8.6*  MG 2.1 2.1  --  2.0  --   --   --  1.9 2.1  --   --   PHOS 3.2 3.5  --  4.0  --  4.0  --  4.1  4.8*  --   --    < > = values in this interval not displayed.   GFR: Estimated Creatinine Clearance: 21.7 mL/min (A) (by C-G formula based on SCr of 2.46 mg/dL (H)). Liver Function Tests: Recent Labs  Lab 04/20/18 0641 04/21/18 0626 04/22/18 0520 04/22/18 1931 04/23/18 0403 04/24/18 0328 04/25/18 0306  AST 26 25 31   --   --   --  23  ALT 12 15 14   --   --   --  13  ALKPHOS 45 49 51  --   --   --  41  BILITOT 0.4 0.7 0.7  --   --   --  0.8  PROT 5.2* 6.2* 6.1*  --   --   --  5.9*  ALBUMIN 2.0* 2.2* 2.2* 2.6* 3.0* 3.8 3.7   No results for input(s): LIPASE, AMYLASE in the last 168 hours. No results for input(s): AMMONIA in the last 168 hours. Coagulation Profile: Recent Labs  Lab 04/26/18 0513  INR 1.1   Cardiac Enzymes: No results for input(s): CKTOTAL, CKMB, CKMBINDEX, TROPONINI in the last 168 hours. BNP (last 3 results) No results for input(s): PROBNP in the last 8760 hours. HbA1C: No results for input(s): HGBA1C in the last 72 hours. CBG: Recent Labs  Lab 04/25/18 0604 04/25/18 1138 04/25/18 1640 04/25/18 2128 04/26/18 0621  GLUCAP 182* 173* 190* 288* 111*   Lipid Profile: No results for input(s): CHOL, HDL, LDLCALC, TRIG, CHOLHDL, LDLDIRECT in the last 72 hours. Thyroid Function Tests: No results for input(s): TSH, T4TOTAL, FREET4, T3FREE, THYROIDAB in the last 72 hours. Anemia Panel: No results for input(s): VITAMINB12, FOLATE, FERRITIN, TIBC, IRON, RETICCTPCT in the last 72 hours. Sepsis Labs: Recent Labs  Lab 04/22/18 0655  PROCALCITON <0.10    No results found for this or any previous visit (from the past 240 hour(s)).       Radiology Studies: No results found.      Scheduled Meds: . amLODipine  5 mg Oral Daily  . arformoterol  15 mcg Nebulization BID  . atorvastatin  80 mg Oral QHS  . budesonide (PULMICORT) nebulizer solution  0.5 mg Nebulization BID  . busPIRone  15  mg Oral BID  . carbamide peroxide  5 drop Right EAR BID  .  carvedilol  25 mg Oral BID WC  . divalproex  1,000 mg Oral Daily  . guaiFENesin  1,200 mg Oral BID  . hydrALAZINE  100 mg Oral Q8H  . insulin aspart  0-5 Units Subcutaneous QHS  . insulin aspart  0-9 Units Subcutaneous TID WC  . isosorbide dinitrate  20 mg Oral TID  . levothyroxine  200 mcg Oral Daily  . Melatonin  3 mg Oral QHS  . morphine  15 mg Oral BID  . multivitamin with minerals  1 tablet Oral Daily  . pantoprazole  40 mg Oral Daily  . [START ON 04/27/2018] predniSONE  30 mg Oral Q breakfast  . sodium chloride flush  3 mL Intravenous Q12H   Continuous Infusions:   LOS: 15 days     Georgette Shell, MD [facility]  If 7PM-7AM, please contact night-coverage [email or web address] Password [facility] 04/26/2018, 11:33 AM

## 2018-04-26 NOTE — Progress Notes (Signed)
SATURATION QUALIFICATIONS: (This note is used to comply with regulatory documentation for oxygen)  Patient Saturations on Room Air at Rest = 94%  Patient Saturations on Room Air while Ambulating = 88%  Patient Saturations on 2 Liters of oxygen while Ambulating = 97%  Please briefly explain why patient needs home oxygen:

## 2018-04-26 NOTE — Progress Notes (Signed)
Patient discharged:  [facility]   Via: [facility]  Discharge paperwork given  IV and telemetry disconnected  Belongings given to patient  Report given to [place], RN in SNF

## 2018-04-28 ENCOUNTER — Telehealth: Payer: Self-pay | Admitting: Cardiology

## 2018-04-28 NOTE — Telephone Encounter (Signed)
New message   Patient would like a call in reference to being able to get oxygen service in the rehab center that she is at and will need home health and oxygen at home. Please call to discuss.

## 2018-04-28 NOTE — Telephone Encounter (Signed)
I contacted patient again on number provided. It goes straight to voicemail- advised patient I did not have a direct phone number, call number to main office line.   I will route to DOD and to nurse- as Dr.Crenshaw is out on vacation and his nurse as well. Was advised that the primary nurses of provider would have to sign off on the order for home health and o2.

## 2018-04-28 NOTE — Telephone Encounter (Signed)
I attempted to contact patient to see if she was going home from the rehab center to need home health and o2 at home.   Will route to our patient coordinator to see if we can set this up. Thanks!

## 2018-04-28 NOTE — Telephone Encounter (Signed)
F/U Message           Patient called back and would like for someone to  Call her back on  [phone]. Patient states she is having trouble with her phone and if she is unable to answer she would like a # to call the nurse back. Patient states she really need to talk with someone today, it's kinda of a emergency. (Per Patient).

## 2018-04-28 NOTE — Telephone Encounter (Signed)
Attempted to contact patient back, goes straight to voicemail. LVM advising patient to call back to discuss.

## 2018-04-28 NOTE — Telephone Encounter (Signed)
Patient called back stating she is still waiting on call.  I advised her nurse attempt to get a hold of her but was unsuccessful.

## 2018-04-30 NOTE — Telephone Encounter (Signed)
Patient is calling back to get an update on the order for home care and O2. She said her phone does not ring in the facility she's in but you can text her

## 2018-04-30 NOTE — Telephone Encounter (Signed)
I can sign - as long as the follow-up orders go back to Dr. Stanford Breed. I am @ [facility].  Glenetta Hew, MD

## 2018-04-30 NOTE — Telephone Encounter (Signed)
Can we have DOD and nurse sign off on this order while Dr.Crenshaw is out? Thanks!

## 2018-05-01 NOTE — Telephone Encounter (Signed)
Note was never brought to me to sign.  Glenetta Hew, MD

## 2018-05-01 NOTE — Telephone Encounter (Signed)
I am sorry I missed this message, I was not online yesterday afternoon.  I did message our help at [facility] on how to go about getting this done since we are not in office. Dr.Crenshaw and Hilda Blades return on Monday- I made them aware of this message.   Thank you!

## 2018-05-02 NOTE — Telephone Encounter (Signed)
OK thanks. DH 

## 2018-05-07 ENCOUNTER — Inpatient Hospital Stay (HOSPITAL_COMMUNITY)
Admission: EM | Admit: 2018-05-07 | Discharge: 2018-05-21 | DRG: 264 | Disposition: A | Discharge status: Other Acute Inpatient Hospital | Payer: Medicare Other | Attending: Internal Medicine | Admitting: Internal Medicine

## 2018-05-07 ENCOUNTER — Other Ambulatory Visit: Payer: Self-pay

## 2018-05-07 ENCOUNTER — Emergency Department (HOSPITAL_COMMUNITY): Payer: Medicare Other

## 2018-05-07 ENCOUNTER — Encounter (HOSPITAL_COMMUNITY): Payer: Self-pay

## 2018-05-07 DIAGNOSIS — R600 Localized edema: Secondary | ICD-10-CM

## 2018-05-07 DIAGNOSIS — Z886 Allergy status to analgesic agent status: Secondary | ICD-10-CM

## 2018-05-07 DIAGNOSIS — Z6832 Body mass index (BMI) 32.0-32.9, adult: Secondary | ICD-10-CM

## 2018-05-07 DIAGNOSIS — Z9981 Dependence on supplemental oxygen: Secondary | ICD-10-CM | POA: Diagnosis not present

## 2018-05-07 DIAGNOSIS — J9811 Atelectasis: Secondary | ICD-10-CM | POA: Diagnosis present

## 2018-05-07 DIAGNOSIS — J9601 Acute respiratory failure with hypoxia: Secondary | ICD-10-CM | POA: Diagnosis not present

## 2018-05-07 DIAGNOSIS — R609 Edema, unspecified: Secondary | ICD-10-CM | POA: Diagnosis not present

## 2018-05-07 DIAGNOSIS — Z9049 Acquired absence of other specified parts of digestive tract: Secondary | ICD-10-CM

## 2018-05-07 DIAGNOSIS — N179 Acute kidney failure, unspecified: Secondary | ICD-10-CM | POA: Diagnosis present

## 2018-05-07 DIAGNOSIS — I2510 Atherosclerotic heart disease of native coronary artery without angina pectoris: Secondary | ICD-10-CM

## 2018-05-07 DIAGNOSIS — L0231 Cutaneous abscess of buttock: Secondary | ICD-10-CM

## 2018-05-07 DIAGNOSIS — G8929 Other chronic pain: Secondary | ICD-10-CM | POA: Diagnosis present

## 2018-05-07 DIAGNOSIS — Z794 Long term (current) use of insulin: Secondary | ICD-10-CM

## 2018-05-07 DIAGNOSIS — G894 Chronic pain syndrome: Secondary | ICD-10-CM | POA: Diagnosis present

## 2018-05-07 DIAGNOSIS — J121 Respiratory syncytial virus pneumonia: Secondary | ICD-10-CM | POA: Diagnosis present

## 2018-05-07 DIAGNOSIS — E119 Type 2 diabetes mellitus without complications: Secondary | ICD-10-CM

## 2018-05-07 DIAGNOSIS — K219 Gastro-esophageal reflux disease without esophagitis: Secondary | ICD-10-CM | POA: Diagnosis present

## 2018-05-07 DIAGNOSIS — Z87898 Personal history of other specified conditions: Secondary | ICD-10-CM

## 2018-05-07 DIAGNOSIS — N183 Chronic kidney disease, stage 3 unspecified: Secondary | ICD-10-CM | POA: Insufficient documentation

## 2018-05-07 DIAGNOSIS — Z8701 Personal history of pneumonia (recurrent): Secondary | ICD-10-CM

## 2018-05-07 DIAGNOSIS — I10 Essential (primary) hypertension: Secondary | ICD-10-CM | POA: Diagnosis present

## 2018-05-07 DIAGNOSIS — Z7951 Long term (current) use of inhaled steroids: Secondary | ICD-10-CM

## 2018-05-07 DIAGNOSIS — E1122 Type 2 diabetes mellitus with diabetic chronic kidney disease: Secondary | ICD-10-CM | POA: Diagnosis present

## 2018-05-07 DIAGNOSIS — E039 Hypothyroidism, unspecified: Secondary | ICD-10-CM | POA: Diagnosis present

## 2018-05-07 DIAGNOSIS — Z885 Allergy status to narcotic agent status: Secondary | ICD-10-CM

## 2018-05-07 DIAGNOSIS — Z955 Presence of coronary angioplasty implant and graft: Secondary | ICD-10-CM | POA: Diagnosis not present

## 2018-05-07 DIAGNOSIS — E440 Moderate protein-calorie malnutrition: Secondary | ICD-10-CM | POA: Diagnosis present

## 2018-05-07 DIAGNOSIS — I959 Hypotension, unspecified: Secondary | ICD-10-CM | POA: Diagnosis not present

## 2018-05-07 DIAGNOSIS — D62 Acute posthemorrhagic anemia: Secondary | ICD-10-CM | POA: Diagnosis not present

## 2018-05-07 DIAGNOSIS — Z7952 Long term (current) use of systemic steroids: Secondary | ICD-10-CM

## 2018-05-07 DIAGNOSIS — D696 Thrombocytopenia, unspecified: Secondary | ICD-10-CM | POA: Diagnosis present

## 2018-05-07 DIAGNOSIS — Z9861 Coronary angioplasty status: Secondary | ICD-10-CM | POA: Diagnosis not present

## 2018-05-07 DIAGNOSIS — R627 Adult failure to thrive: Secondary | ICD-10-CM

## 2018-05-07 DIAGNOSIS — L8990 Pressure ulcer of unspecified site, unspecified stage: Secondary | ICD-10-CM

## 2018-05-07 DIAGNOSIS — R0602 Shortness of breath: Secondary | ICD-10-CM | POA: Diagnosis present

## 2018-05-07 DIAGNOSIS — J189 Pneumonia, unspecified organism: Secondary | ICD-10-CM | POA: Diagnosis not present

## 2018-05-07 DIAGNOSIS — I130 Hypertensive heart and chronic kidney disease with heart failure and stage 1 through stage 4 chronic kidney disease, or unspecified chronic kidney disease: Principal | ICD-10-CM | POA: Diagnosis present

## 2018-05-07 DIAGNOSIS — Z853 Personal history of malignant neoplasm of breast: Secondary | ICD-10-CM

## 2018-05-07 DIAGNOSIS — M1712 Unilateral primary osteoarthritis, left knee: Secondary | ICD-10-CM | POA: Diagnosis present

## 2018-05-07 DIAGNOSIS — Z90710 Acquired absence of both cervix and uterus: Secondary | ICD-10-CM

## 2018-05-07 DIAGNOSIS — Z9013 Acquired absence of bilateral breasts and nipples: Secondary | ICD-10-CM

## 2018-05-07 DIAGNOSIS — E875 Hyperkalemia: Secondary | ICD-10-CM | POA: Diagnosis present

## 2018-05-07 DIAGNOSIS — Z20828 Contact with and (suspected) exposure to other viral communicable diseases: Secondary | ICD-10-CM | POA: Diagnosis present

## 2018-05-07 DIAGNOSIS — Z79899 Other long term (current) drug therapy: Secondary | ICD-10-CM

## 2018-05-07 DIAGNOSIS — R569 Unspecified convulsions: Secondary | ICD-10-CM

## 2018-05-07 DIAGNOSIS — N184 Chronic kidney disease, stage 4 (severe): Secondary | ICD-10-CM | POA: Diagnosis present

## 2018-05-07 DIAGNOSIS — N189 Chronic kidney disease, unspecified: Secondary | ICD-10-CM

## 2018-05-07 DIAGNOSIS — Z888 Allergy status to other drugs, medicaments and biological substances status: Secondary | ICD-10-CM

## 2018-05-07 DIAGNOSIS — Z79890 Hormone replacement therapy (postmenopausal): Secondary | ICD-10-CM

## 2018-05-07 DIAGNOSIS — E870 Hyperosmolality and hypernatremia: Secondary | ICD-10-CM | POA: Diagnosis not present

## 2018-05-07 DIAGNOSIS — L03317 Cellulitis of buttock: Secondary | ICD-10-CM | POA: Diagnosis present

## 2018-05-07 DIAGNOSIS — Z9221 Personal history of antineoplastic chemotherapy: Secondary | ICD-10-CM

## 2018-05-07 DIAGNOSIS — I5033 Acute on chronic diastolic (congestive) heart failure: Secondary | ICD-10-CM | POA: Diagnosis present

## 2018-05-07 DIAGNOSIS — I5032 Chronic diastolic (congestive) heart failure: Secondary | ICD-10-CM | POA: Diagnosis not present

## 2018-05-07 DIAGNOSIS — D649 Anemia, unspecified: Secondary | ICD-10-CM | POA: Diagnosis present

## 2018-05-07 DIAGNOSIS — IMO0001 Reserved for inherently not codable concepts without codable children: Secondary | ICD-10-CM

## 2018-05-07 DIAGNOSIS — Z7902 Long term (current) use of antithrombotics/antiplatelets: Secondary | ICD-10-CM

## 2018-05-07 DIAGNOSIS — Z881 Allergy status to other antibiotic agents status: Secondary | ICD-10-CM

## 2018-05-07 DIAGNOSIS — L89319 Pressure ulcer of right buttock, unspecified stage: Secondary | ICD-10-CM | POA: Diagnosis not present

## 2018-05-07 DIAGNOSIS — J9621 Acute and chronic respiratory failure with hypoxia: Secondary | ICD-10-CM | POA: Diagnosis not present

## 2018-05-07 DIAGNOSIS — Z79891 Long term (current) use of opiate analgesic: Secondary | ICD-10-CM

## 2018-05-07 DIAGNOSIS — R5381 Other malaise: Secondary | ICD-10-CM | POA: Diagnosis present

## 2018-05-07 DIAGNOSIS — D631 Anemia in chronic kidney disease: Secondary | ICD-10-CM | POA: Diagnosis present

## 2018-05-07 DIAGNOSIS — M545 Low back pain: Secondary | ICD-10-CM | POA: Diagnosis present

## 2018-05-07 DIAGNOSIS — E785 Hyperlipidemia, unspecified: Secondary | ICD-10-CM | POA: Diagnosis present

## 2018-05-07 DIAGNOSIS — G40909 Epilepsy, unspecified, not intractable, without status epilepticus: Secondary | ICD-10-CM | POA: Diagnosis present

## 2018-05-07 DIAGNOSIS — F411 Generalized anxiety disorder: Secondary | ICD-10-CM | POA: Diagnosis present

## 2018-05-07 LAB — CBC WITH DIFFERENTIAL/PLATELET
Abs Immature Granulocytes: 0.01 10*3/uL (ref 0.00–0.07)
Basophils Absolute: 0 10*3/uL (ref 0.0–0.1)
Basophils Relative: 0 %
Eosinophils Absolute: 0.2 10*3/uL (ref 0.0–0.5)
Eosinophils Relative: 2 %
HCT: 26.1 % — ABNORMAL LOW (ref 36.0–46.0)
Hemoglobin: 7.8 g/dL — ABNORMAL LOW (ref 12.0–15.0)
Immature Granulocytes: 0 %
Lymphocytes Relative: 15 %
Lymphs Abs: 1 10*3/uL (ref 0.7–4.0)
MCH: 28.3 pg (ref 26.0–34.0)
MCHC: 29.9 g/dL — ABNORMAL LOW (ref 30.0–36.0)
MCV: 94.6 fL (ref 80.0–100.0)
Monocytes Absolute: 0.4 10*3/uL (ref 0.1–1.0)
Monocytes Relative: 6 %
Neutro Abs: 5.3 10*3/uL (ref 1.7–7.7)
Neutrophils Relative %: 77 %
Platelets: 118 10*3/uL — ABNORMAL LOW (ref 150–400)
RBC: 2.76 MIL/uL — ABNORMAL LOW (ref 3.87–5.11)
RDW: 14.6 % (ref 11.5–15.5)
WBC: 6.9 10*3/uL (ref 4.0–10.5)
nRBC: 0 % (ref 0.0–0.2)

## 2018-05-07 LAB — COMPREHENSIVE METABOLIC PANEL
ALT: 30 U/L (ref 0–44)
AST: 47 U/L — ABNORMAL HIGH (ref 15–41)
Albumin: 2.8 g/dL — ABNORMAL LOW (ref 3.5–5.0)
Alkaline Phosphatase: 51 U/L (ref 38–126)
Anion gap: 9 (ref 5–15)
BUN: 120 mg/dL — ABNORMAL HIGH (ref 8–23)
CO2: 30 mmol/L (ref 22–32)
Calcium: 8.7 mg/dL — ABNORMAL LOW (ref 8.9–10.3)
Chloride: 103 mmol/L (ref 98–111)
Creatinine, Ser: 2.37 mg/dL — ABNORMAL HIGH (ref 0.44–1.00)
GFR calc Af Amer: 24 mL/min — ABNORMAL LOW (ref >60)
GFR calc non Af Amer: 21 mL/min — ABNORMAL LOW (ref >60)
Glucose, Bld: 190 mg/dL — ABNORMAL HIGH (ref 70–99)
Potassium: 5.6 mmol/L — ABNORMAL HIGH (ref 3.5–5.1)
Sodium: 142 mmol/L (ref 135–145)
Total Bilirubin: 0.9 mg/dL (ref 0.3–1.2)
Total Protein: 5.7 g/dL — ABNORMAL LOW (ref 6.5–8.1)

## 2018-05-07 LAB — GLUCOSE, CAPILLARY: Glucose-Capillary: 143 mg/dL — ABNORMAL HIGH (ref 70–99)

## 2018-05-07 LAB — POC OCCULT BLOOD, ED: Fecal Occult Bld: NEGATIVE

## 2018-05-07 LAB — CBG MONITORING, ED: Glucose-Capillary: 148 mg/dL — ABNORMAL HIGH (ref 70–99)

## 2018-05-07 LAB — BRAIN NATRIURETIC PEPTIDE: B Natriuretic Peptide: 1510.5 pg/mL — ABNORMAL HIGH (ref 0.0–100.0)

## 2018-05-07 LAB — SARS CORONAVIRUS 2 BY RT PCR (HOSPITAL ORDER, PERFORMED IN ~~LOC~~ HOSPITAL LAB): SARS Coronavirus 2: NEGATIVE

## 2018-05-07 LAB — POTASSIUM: Potassium: 5.7 mmol/L — ABNORMAL HIGH (ref 3.5–5.1)

## 2018-05-07 LAB — LIPASE, BLOOD: Lipase: 32 U/L (ref 11–51)

## 2018-05-07 LAB — TROPONIN I: Troponin I: 0.04 ng/mL (ref <0.03)

## 2018-05-07 MED ORDER — ACETAMINOPHEN 325 MG PO TABS
650.0000 mg | ORAL_TABLET | Freq: Four times a day (QID) | ORAL | Status: DC | PRN
  Administered 2018-05-14 – 2018-05-20 (×2): 650 mg via ORAL
  Filled 2018-05-07 (×4): qty 2

## 2018-05-07 MED ORDER — GUAIFENESIN ER 600 MG PO TB12
600.0000 mg | ORAL_TABLET | Freq: Two times a day (BID) | ORAL | Status: DC
  Administered 2018-05-08 – 2018-05-11 (×9): 600 mg via ORAL
  Filled 2018-05-07 (×11): qty 1

## 2018-05-07 MED ORDER — ISOSORBIDE MONONITRATE ER 30 MG PO TB24
30.0000 mg | ORAL_TABLET | Freq: Every day | ORAL | Status: DC
  Administered 2018-05-08 – 2018-05-21 (×15): 30 mg via ORAL
  Filled 2018-05-07 (×15): qty 1

## 2018-05-07 MED ORDER — BUSPIRONE HCL 5 MG PO TABS
15.0000 mg | ORAL_TABLET | Freq: Two times a day (BID) | ORAL | Status: DC
  Administered 2018-05-08 – 2018-05-21 (×28): 15 mg via ORAL
  Filled 2018-05-07 (×28): qty 3

## 2018-05-07 MED ORDER — CLOPIDOGREL BISULFATE 75 MG PO TABS
75.0000 mg | ORAL_TABLET | Freq: Every day | ORAL | Status: DC
  Administered 2018-05-08 – 2018-05-21 (×14): 75 mg via ORAL
  Filled 2018-05-07 (×14): qty 1

## 2018-05-07 MED ORDER — SODIUM CHLORIDE 0.9 % IV SOLN
1.0000 g | INTRAVENOUS | Status: DC
  Administered 2018-05-08: 1 g via INTRAVENOUS
  Filled 2018-05-07 (×2): qty 10

## 2018-05-07 MED ORDER — MELATONIN 3 MG PO TABS
3.0000 mg | ORAL_TABLET | Freq: Every day | ORAL | Status: DC
  Administered 2018-05-08 – 2018-05-20 (×11): 3 mg via ORAL
  Filled 2018-05-07 (×15): qty 1

## 2018-05-07 MED ORDER — PANTOPRAZOLE SODIUM 40 MG PO TBEC
40.0000 mg | DELAYED_RELEASE_TABLET | Freq: Every day | ORAL | Status: DC
  Administered 2018-05-08 – 2018-05-21 (×14): 40 mg via ORAL
  Filled 2018-05-07 (×15): qty 1

## 2018-05-07 MED ORDER — LEVOTHYROXINE SODIUM 100 MCG PO TABS
200.0000 ug | ORAL_TABLET | Freq: Every day | ORAL | Status: DC
  Administered 2018-05-08 – 2018-05-21 (×13): 200 ug via ORAL
  Filled 2018-05-07 (×15): qty 2

## 2018-05-07 MED ORDER — INSULIN ASPART 100 UNIT/ML ~~LOC~~ SOLN
0.0000 [IU] | Freq: Three times a day (TID) | SUBCUTANEOUS | Status: DC
  Administered 2018-05-08: 1 [IU] via SUBCUTANEOUS
  Administered 2018-05-08: 2 [IU] via SUBCUTANEOUS
  Administered 2018-05-08: 3 [IU] via SUBCUTANEOUS
  Administered 2018-05-09: 17:00:00 2 [IU] via SUBCUTANEOUS
  Administered 2018-05-10: 1 [IU] via SUBCUTANEOUS
  Administered 2018-05-10: 2 [IU] via SUBCUTANEOUS
  Administered 2018-05-11 – 2018-05-12 (×3): 1 [IU] via SUBCUTANEOUS
  Administered 2018-05-13: 5 [IU] via SUBCUTANEOUS
  Administered 2018-05-14: 2 [IU] via SUBCUTANEOUS
  Administered 2018-05-14 (×2): 1 [IU] via SUBCUTANEOUS
  Administered 2018-05-15: 2 [IU] via SUBCUTANEOUS
  Administered 2018-05-15: 3 [IU] via SUBCUTANEOUS
  Administered 2018-05-16 – 2018-05-17 (×2): 2 [IU] via SUBCUTANEOUS
  Administered 2018-05-18: 1 [IU] via SUBCUTANEOUS

## 2018-05-07 MED ORDER — ARFORMOTEROL TARTRATE 15 MCG/2ML IN NEBU
15.0000 ug | INHALATION_SOLUTION | Freq: Two times a day (BID) | RESPIRATORY_TRACT | Status: DC
  Administered 2018-05-08 – 2018-05-21 (×27): 15 ug via RESPIRATORY_TRACT
  Filled 2018-05-07 (×28): qty 2

## 2018-05-07 MED ORDER — BUDESONIDE 0.5 MG/2ML IN SUSP
0.5000 mg | Freq: Two times a day (BID) | RESPIRATORY_TRACT | Status: DC
  Administered 2018-05-08 – 2018-05-21 (×27): 0.5 mg via RESPIRATORY_TRACT
  Filled 2018-05-07 (×28): qty 2

## 2018-05-07 MED ORDER — FUROSEMIDE 10 MG/ML IJ SOLN
40.0000 mg | Freq: Two times a day (BID) | INTRAMUSCULAR | Status: DC
  Administered 2018-05-08 (×3): 40 mg via INTRAVENOUS
  Filled 2018-05-07 (×3): qty 4

## 2018-05-07 MED ORDER — DOXYCYCLINE HYCLATE 100 MG PO TABS
100.0000 mg | ORAL_TABLET | Freq: Two times a day (BID) | ORAL | Status: DC
  Administered 2018-05-08 – 2018-05-21 (×29): 100 mg via ORAL
  Filled 2018-05-07 (×29): qty 1

## 2018-05-07 MED ORDER — MORPHINE SULFATE ER 15 MG PO TBCR
15.0000 mg | EXTENDED_RELEASE_TABLET | Freq: Two times a day (BID) | ORAL | Status: DC
  Administered 2018-05-08 – 2018-05-17 (×20): 15 mg via ORAL
  Filled 2018-05-07 (×20): qty 1

## 2018-05-07 MED ORDER — SODIUM CHLORIDE 0.9% IV SOLUTION
Freq: Once | INTRAVENOUS | Status: AC
  Administered 2018-05-08: 04:00:00 via INTRAVENOUS

## 2018-05-07 MED ORDER — DIVALPROEX SODIUM 250 MG PO DR TAB
1000.0000 mg | DELAYED_RELEASE_TABLET | Freq: Every day | ORAL | Status: DC
  Administered 2018-05-08 – 2018-05-21 (×15): 1000 mg via ORAL
  Filled 2018-05-07 (×15): qty 4

## 2018-05-07 MED ORDER — SODIUM CHLORIDE 0.9% FLUSH
3.0000 mL | Freq: Two times a day (BID) | INTRAVENOUS | Status: DC
  Administered 2018-05-08 – 2018-05-21 (×26): 3 mL via INTRAVENOUS

## 2018-05-07 MED ORDER — LORAZEPAM 0.5 MG PO TABS: 0.5000 mg | ORAL_TABLET | Freq: Three times a day (TID) | ORAL | Status: DC | PRN

## 2018-05-07 MED ORDER — HYDRALAZINE HCL 50 MG PO TABS
100.0000 mg | ORAL_TABLET | Freq: Three times a day (TID) | ORAL | Status: DC
  Administered 2018-05-08 (×3): 100 mg via ORAL
  Filled 2018-05-07 (×3): qty 2

## 2018-05-07 MED ORDER — INSULIN GLARGINE 100 UNIT/ML ~~LOC~~ SOLN
10.0000 [IU] | Freq: Every day | SUBCUTANEOUS | Status: DC
  Administered 2018-05-08 (×2): 10 [IU] via SUBCUTANEOUS
  Filled 2018-05-07 (×3): qty 0.1

## 2018-05-07 MED ORDER — CARVEDILOL 25 MG PO TABS
25.0000 mg | ORAL_TABLET | Freq: Two times a day (BID) | ORAL | Status: DC
  Administered 2018-05-08 – 2018-05-21 (×26): 25 mg via ORAL
  Filled 2018-05-07 (×6): qty 1
  Filled 2018-05-07: qty 2
  Filled 2018-05-07 (×16): qty 1
  Filled 2018-05-07: qty 2
  Filled 2018-05-07 (×2): qty 1

## 2018-05-07 MED ORDER — ACETAMINOPHEN 650 MG RE SUPP: 650.0000 mg | Freq: Four times a day (QID) | RECTAL | Status: DC | PRN

## 2018-05-07 MED ORDER — ATORVASTATIN CALCIUM 80 MG PO TABS
80.0000 mg | ORAL_TABLET | Freq: Every day | ORAL | Status: DC
  Administered 2018-05-08 – 2018-05-20 (×14): 80 mg via ORAL
  Filled 2018-05-07 (×14): qty 1

## 2018-05-07 MED ORDER — OXYCODONE HCL 5 MG PO TABS
5.0000 mg | ORAL_TABLET | Freq: Four times a day (QID) | ORAL | Status: DC | PRN
  Administered 2018-05-08 – 2018-05-21 (×31): 5 mg via ORAL
  Filled 2018-05-07 (×31): qty 1

## 2018-05-07 NOTE — ED Provider Notes (Signed)
Patient seen after signout from prior ED provider.  Patient with multiple social issues that will complicate eventual discharge.  Patient's work-up does suggest worsening congestive heart failure with increased right-sided pleural effusion on CT.   COVID testing is negative. No current evidence of infectious process.  Additionally, patient's BUN is gradually trending upwards.  Her BUN today is 120.  Patient would likely benefit from hospitalization for further work-up and treatment.  Hospitalist service is aware of case and will evaluate for admission.   Valarie Merino, MD 05/07/18 Lurena Nida

## 2018-05-07 NOTE — ED Notes (Signed)
Portable xray at bedside.

## 2018-05-07 NOTE — ED Provider Notes (Signed)
[facility] EMERGENCY DEPARTMENT Provider Note   CSN: [id number] Arrival date & time: 05/07/18  1245    History   Chief Complaint Chief Complaint  Patient presents with  . Weakness    HPI Rajanee Schuelke is a 68 y.o. female.     Initially we thought patient was brought in from nursing facility and that she was refusing to go back.  But only contacted the nursing facility which was been in the [facility] patient actually signed herself out of there from rehab 3 days ago when she was post to be home living with her son.  But her son is not living with her he lives nearby and does check on her daily.  Patient is back here probably mostly because she is not able to take care of her self at home.  She does have some help at home.  Had social worker involved and we sorted all this out.  Face-to-face has been done to get additional home health.  Patient was admitted the end of March for some respiratory failure and acute on chronic diastolic congestive heart failure.  Also was diagnosed with RSV pneumonia at that time.  She has type 2 diabetes.  Patient's back in with a cough and was complaining of shortness of breath.  And so work-up ensued from there.  Patient does have some degree of confusion but is able to answer most questions that she also is on home oxygen now and she normally uses 2 L.  Oxygen sats on that were in the upper 90s.  Patient not in any apparent respiratory distress here today.     Past Medical History:  Diagnosis Date  . Breast cancer ([place])    bilat mastectomy and LUE lymphadenectomy 2014, chemoRx 2014 - 15  . CHF (congestive heart failure) ([place])   . Diabetes mellitus without complication ([place])   . Esophageal reflux   . Hypertension   . Hypothyroid   . Migraine   . Seizure [facility])     Patient Active Problem List   Diagnosis Date Noted  . Acute on chronic diastolic CHF (congestive heart failure) ([place]) 04/11/2018  . RSV (respiratory  syncytial virus pneumonia) 04/11/2018  . DM type 2 (diabetes mellitus, type 2) ([place]) 04/11/2018  . CKD (chronic kidney disease), stage IV ([place]) 04/11/2018  . Acute hypoxemic respiratory failure ([place]) 04/11/2018  . Encephalopathy 03/18/2018  . Hyperkalemia 03/15/2018  . 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 Chronic systolic heart failure ([place]) 02/12/2018  . Acute respiratory failure with hypoxia ([place]) 01/20/2018  . Constipation 01/20/2018  . High risk medication use 01/19/2018  . Chest pain, rule out acute myocardial infarction 01/17/2018  . Drug allergy, multiple 01/08/2018  . History of breast cancer 01/08/2018  . Ischemic chest pain ([place]) 01/04/2018  . Chest pain 01/04/2018  . CAD S/P percutaneous coronary angioplasty 12/30/2017  . Unspecified abdominal pain 06/02/2017  . C. difficile colitis 03/17/2017  . AKI (acute kidney injury) ([place])   . Diverticulitis 02/17/2017  . Frequent falls 01/08/2017  . Rhabdomyolysis 01/08/2017  . Intractable nausea and vomiting 11/23/2016  . Insulin dependent diabetes mellitus ([place])   . Multiple rib fractures 11/28/2013  . Diarrhea 10/05/2013  . Dyslipidemia 09/16/2013  . History of seizures 08/04/2013  . Chronic pain 07/01/2013  . Anemia 03/16/2013  . Inadequate pain control 03/15/2013  . Cancer of left breast ([place]) 01/20/2013  . Migraine headache 11/10/2012  . Malignant HTN with heart  disease, w/o CHF, w/o chronic kidney disease 11/06/2012  . Diabetic peripheral neuropathy associated with type 2 diabetes mellitus ([place]) 04/17/2012  . GAD (generalized anxiety disorder) 05/25/2010  . Persistent insomnia 05/25/2010  . Disorder of kidney and ureter 01/26/2009  . Type 2 diabetes mellitus with hyperglycemia, with long-term current use of insulin ([place]) 11/03/2008  . Essential hypertension 09/21/2008  . Esophageal reflux 08/05/2008  . Seizure disorder ([place]) 04/12/2008  . Hypothyroidism 02/27/2007     Past Surgical History:  Procedure Laterality Date  . ABDOMINAL HYSTERECTOMY     1984 , done for ruptured cyst and endometriosis  . APPENDECTOMY    . CHOLECYSTECTOMY     1980's  . CORONARY STENT INTERVENTION N/A 12/30/2017   Procedure: CORONARY STENT INTERVENTION;  Surgeon: Martinique, Peter M, MD;  Location: [facility];  Service: Cardiovascular;  Laterality: N/A;  . EYE SURGERY  08/28/2015   Right eye  . KNEE SURGERY Left   . MASTECTOMY     bilateral  . Open surgery for bowel obstruction, 2000's    . RIGHT/LEFT HEART CATH AND CORONARY ANGIOGRAPHY N/A 12/29/2017   Procedure: RIGHT/LEFT HEART CATH AND CORONARY ANGIOGRAPHY;  Surgeon: Martinique, Peter M, MD;  Location: [facility];  Service: Cardiovascular;  Laterality: N/A;     OB History   No obstetric history on file.      Home Medications    Prior to Admission medications   Medication Sig Start Date End Date Taking? Authorizing Provider  acetaminophen (TYLENOL) 325 MG tablet Take 2 tablets (650 mg total) by mouth every 6 (six) hours as needed for mild pain, fever or headache. 04/26/18   Georgette Shell, MD  arformoterol (BROVANA) 15 MCG/2ML NEBU Take 2 mLs (15 mcg total) by nebulization 2 (two) times daily. 04/26/18   Georgette Shell, MD  atorvastatin (LIPITOR) 80 MG tablet Take 1 tablet (80 mg total) by mouth at bedtime. 01/28/18   Lendon Colonel, NP  budesonide (PULMICORT) 0.5 MG/2ML nebulizer solution Take 2 mLs (0.5 mg total) by nebulization 2 (two) times daily. 04/26/18   Georgette Shell, MD  busPIRone (BUSPAR) 15 MG tablet Take 15 mg by mouth 2 (two) times daily. 03/09/18 03/09/19  [provider]  carvedilol (COREG) 25 MG tablet Take 1 tablet (25 mg total) by mouth 2 (two) times daily with a meal. 04/26/18   Georgette Shell, MD  clopidogrel (PLAVIX) 75 MG tablet Take 1 tablet (75 mg total) by mouth daily. 04/26/18 04/26/19  Georgette Shell, MD  divalproex (DEPAKOTE) 500 MG DR tablet Take 1,000  mg by mouth daily.    [provider]  furosemide (LASIX) 40 MG tablet Take 1 tablet (40 mg total) by mouth daily. Start taking 04/30/2018 check bmp 04/28/2018 04/26/18 04/26/19  Georgette Shell, MD  guaiFENesin (MUCINEX) 600 MG 12 hr tablet Take 2 tablets (1,200 mg total) by mouth 2 (two) times daily. 04/26/18   Georgette Shell, MD  hydrALAZINE (APRESOLINE) 100 MG tablet Take 1 tablet (100 mg total) by mouth every 8 (eight) hours. 03/07/18   Lavina Hamman, MD  insulin glargine (LANTUS) 100 UNIT/ML injection Inject 0.1 mLs (10 Units total) into the skin at bedtime. 03/27/18   Debbe Odea, MD  isosorbide mononitrate (IMDUR) 30 MG 24 hr tablet Take 1 tablet (30 mg total) by mouth daily. 03/08/18   Lavina Hamman, MD  levothyroxine (SYNTHROID, LEVOTHROID) 200 MCG tablet Take 1 tablet (200 mcg total) by mouth at  bedtime. Patient taking differently: Take 200 mcg by mouth daily before breakfast.  01/12/17   Lavina Hamman, MD  LORazepam (ATIVAN) 0.5 MG tablet Take 1 tablet (0.5 mg total) by mouth every 6 (six) hours as needed for anxiety. 04/26/18   Georgette Shell, MD  Melatonin 5 MG TABS Take 5 mg by mouth at bedtime.    [provider]  morphine (MS CONTIN) 15 MG 12 hr tablet Take 1 tablet (15 mg total) by mouth 2 (two) times daily. 04/26/18   Georgette Shell, MD  Multiple Vitamin (MULTIVITAMIN WITH MINERALS) TABS tablet Take 1 tablet by mouth daily. 03/27/18   Debbe Odea, MD  omeprazole (PRILOSEC) 20 MG capsule Take 20 mg by mouth daily.    [provider]  oxyCODONE (OXY IR/ROXICODONE) 5 MG immediate release tablet Take 1-2 tablets (5-10 mg total) by mouth every 4 (four) hours as needed for severe pain. 04/26/18   Georgette Shell, MD  predniSONE (DELTASONE) 10 MG tablet Take 4 tablets for the first 4 days then 3 tablets for the following 4 days then 2 tablets for the following 4 days and then 1 tablet daily till done 04/26/18   Georgette Shell, MD   senna-docusate (SENOKOT-S) 8.6-50 MG tablet Take 2 tablets by mouth 2 (two) times daily. 01/22/18   Geradine Girt, DO    Family History Family History  Problem Relation Age of Onset  . Sudden Cardiac Death Neg Hx     Social History Social History   Tobacco Use  . Smoking status: Never Smoker  . Smokeless tobacco: Never Used  Substance Use Topics  . Alcohol use: No  . Drug use: No     Allergies   Ticagrelor; Aspartame and phenylalanine; Aspirin; Maxzide [hydrochlorothiazide w-triamterene]; Metformin and related; Nsaids; Other; Pravachol [pravastatin]; Spironolactone; Stadol [butorphanol]; Toradol [ketorolac tromethamine]; Vistaril [hydroxyzine hcl]; Erythromycin; and Morphine and related   Review of Systems Review of Systems  Constitutional: Negative for chills and fever.  HENT: Positive for congestion. Negative for rhinorrhea and sore throat.   Eyes: Negative for visual disturbance.  Respiratory: Positive for shortness of breath. Negative for cough.   Cardiovascular: Positive for leg swelling. Negative for chest pain.  Gastrointestinal: Negative for abdominal pain, diarrhea, nausea and vomiting.  Genitourinary: Negative for dysuria.  Musculoskeletal: Negative for back pain and neck pain.  Skin: Negative for rash.  Neurological: Negative for dizziness, light-headedness and headaches.  Hematological: Does not bruise/bleed easily.  Psychiatric/Behavioral: Negative for confusion.     Physical Exam Updated Vital Signs BP 128/76   Pulse 81   Temp 97.8 F (36.6 C) (Oral)   Resp 18   SpO2 98%   Physical Exam Vitals signs and nursing note reviewed.  Constitutional:      General: She is not in acute distress.    Appearance: Normal appearance. She is well-developed.  HENT:     Head: Normocephalic and atraumatic.  Eyes:     Extraocular Movements: Extraocular movements intact.     Conjunctiva/sclera: Conjunctivae normal.     Pupils: Pupils are equal, round, and  reactive to light.  Neck:     Musculoskeletal: Normal range of motion and neck supple.  Cardiovascular:     Rate and Rhythm: Normal rate and regular rhythm.     Heart sounds: No murmur.  Pulmonary:     Effort: Pulmonary effort is normal. No respiratory distress.     Breath sounds: Rhonchi present. No wheezing.  Abdominal:  General: Bowel sounds are normal.     Palpations: Abdomen is soft.     Tenderness: There is no abdominal tenderness.  Musculoskeletal:        General: Swelling present.     Right lower leg: Edema present.     Left lower leg: Edema present.  Skin:    General: Skin is warm and dry.     Capillary Refill: Capillary refill takes less than 2 seconds.  Neurological:     General: No focal deficit present.     Mental Status: She is alert. Mental status is at baseline.      ED Treatments / Results  Labs (all labs ordered are listed, but only abnormal results are displayed) Labs Reviewed  CBC WITH DIFFERENTIAL/PLATELET - Abnormal; Notable for the following components:      Result Value   RBC 2.76 (*)    Hemoglobin 7.8 (*)    HCT 26.1 (*)    MCHC 29.9 (*)    Platelets 118 (*)    All other components within normal limits  COMPREHENSIVE METABOLIC PANEL - Abnormal; Notable for the following components:   Potassium 5.6 (*)    Glucose, Bld 190 (*)    BUN 120 (*)    Creatinine, Ser 2.37 (*)    Calcium 8.7 (*)    Total Protein 5.7 (*)    Albumin 2.8 (*)    AST 47 (*)    GFR calc non Af Amer 21 (*)    GFR calc Af Amer 24 (*)    All other components within normal limits  SARS CORONAVIRUS 2 (HOSPITAL ORDER, [facility])  LIPASE, BLOOD  BRAIN NATRIURETIC PEPTIDE  TROPONIN I  POTASSIUM  POC OCCULT BLOOD, ED  TYPE AND SCREEN    EKG EKG Interpretation  Date/Time:  Thursday May 07 2018 14:19:56 EDT Ventricular Rate:  79 PR Interval:    QRS Duration: 86 QT Interval:  372 QTC Calculation: 427 R Axis:   71 Text Interpretation:   Sinus rhythm Borderline T abnormalities, lateral leads Baseline wander in lead(s) V3 Confirmed by Fredia Sorrow [phone]) on 05/07/2018 2:27:22 PM   Radiology Dg Chest Port 1 View  Result Date: 05/07/2018 CLINICAL DATA:  Shortness of breath EXAM: PORTABLE CHEST 1 VIEW COMPARISON:  Chest radiograph and chest CT April 22, 2018 FINDINGS: There is extensive airspace consolidation throughout the right mid and lower lung zones with right pleural effusion. Left lung is clear. Heart is upper normal in size with pulmonary vascularity normal. No adenopathy. No bone lesions. IMPRESSION: Extensive airspace consolidation throughout much of the right lung consistent with multifocal pneumonia. Superimposed right effusion. Left lung is clear. Heart upper normal in size. No evident adenopathy. Electronically Signed   By: Lowella Grip III M.D.   On: 05/07/2018 14:23    Procedures Procedures (including critical care time)  Medications Ordered in ED Medications - No data to display   Initial Impression / Assessment and Plan / ED Course  I have reviewed the triage vital signs and the nursing notes.  Pertinent labs & imaging results that were available during my care of the patient were reviewed by me and considered in my medical decision making (see chart for details).        Fair amount of investigative work by the Education officer, museum.  It appears patient signed herself out of the nursing facility rehab center 3 days ago.  Is now at home.  Seems to have some confusion about that.  Thinks appears that she wants her son to be staying with her and he is not.  Face-to-face by Education officer, museum is already been completed there can get additional home health items out for her and perhaps even nursing care.  If she is eligible to go home.  Work-up here chest x-ray raise concern for multifocal pneumonia but in March she was diagnosed with RSV.  She is known to have CHF.  Clinically she seems to have a lot of leg edema and  little bit of body edema.  Feel that this may be CHF.  BNP .  Patient no hypoxia.  No fevers here.  Does have some bilateral lung rhonchi and clearly has a little bit of a cough.  Not but that was also present in March as well  Patients can have CT of her chest without contrast she does have a markedly elevated BUN is no blood in her GI system.  Potassium was also elevated that is going to be repeated.  But potassium is not in a critical level that mandates admission.  Patient has known history of chronic anemia.  Hemoglobin was down.  But during her last admission she received some blood she is down to 7.9 not requiring blood transfusion.  But this may be due to chronic because Hemoccult stool was negative for any blood.  So I also does not explain the elevated BUN.    Final Clinical Impressions(s) / ED Diagnoses   Final diagnoses:  SOB (shortness of breath)  Bilateral leg edema  Chronic renal failure, unspecified CKD stage    ED Discharge Orders    None       Fredia Sorrow, MD 05/07/18 1728

## 2018-05-07 NOTE — ED Notes (Signed)
ED TO INPATIENT HANDOFF REPORT  ED Nurse Name and Phone #: Kathie Rhodes RN  S Name/Age/Gender Andrea [name] 68 y.o. female Room/Bed: 032C/032C  Code Status   Code Status: Full Code  Home/SNF/Other Rehab Patient oriented to: self, place, time and situation Is this baseline? Yes   Triage Complete: Triage complete  Chief Complaint Weakness  Triage Note Per [facility], pt d/c from here last week to [facility] after dx of pneumonia. Has continued to have generalized weakness and extremity edema worse, wishes to be sent to a different rehab facility. Axox4. VSS, CBG 254. Wears 2L [place] at all times.    Allergies Allergies  Allergen Reactions  . Ticagrelor Rash  . Aspartame And Phenylalanine Hives and Diarrhea  . Aspirin Nausea And Vomiting  . Maxzide [Hydrochlorothiazide W-Triamterene] Swelling    Fluid retention  . Metformin And Related Diarrhea  . Nsaids Nausea And Vomiting  . Other Other (See Comments)    All steroids produce psychosis per pt Artificial sweeteners produce nausea and upset stomach.  Gregary Cromer [Pravastatin] Other (See Comments)    unknown  . Spironolactone   . Stadol [Butorphanol] Other (See Comments)    Toradol, etc And related- hallucinations  . Toradol [Ketorolac Tromethamine] Other (See Comments)    Hallucinations   . Vistaril [Hydroxyzine Hcl] Other (See Comments)  . Erythromycin Itching and Rash  . Morphine And Related Hives and Rash    Morphine given via IV    Level of Care/Admitting Diagnosis ED Disposition    ED Disposition Condition [facility]: [place] [[id number]]  Level of Care: Telemetry Cardiac [103]  Covid Evaluation: N/A  Diagnosis: Acute on chronic diastolic congestive heart failure [facility]) [[id number]]  Admitting Physician: Lenore Cordia [[id number]]  Attending Physician: Lenore Cordia [[id number]]  Estimated length of stay: past midnight tomorrow  Certification:: I certify this  patient will need inpatient services for at least 2 midnights  PT Class (Do Not Modify): Inpatient [101]  PT Acc Code (Do Not Modify): Private [1]       B Medical/Surgery History Past Medical History:  Diagnosis Date  . Breast cancer ([place])    bilat mastectomy and LUE lymphadenectomy 2014, chemoRx 2014 - 15  . CHF (congestive heart failure) ([place])   . Diabetes mellitus without complication ([place])   . Esophageal reflux   . Hypertension   . Hypothyroid   . Migraine   . Seizure [facility])    Past Surgical History:  Procedure Laterality Date  . ABDOMINAL HYSTERECTOMY     1984 , done for ruptured cyst and endometriosis  . APPENDECTOMY    . CHOLECYSTECTOMY     1980's  . CORONARY STENT INTERVENTION N/A 12/30/2017   Procedure: CORONARY STENT INTERVENTION;  Surgeon: Martinique, Peter M, MD;  Location: [facility];  Service: Cardiovascular;  Laterality: N/A;  . EYE SURGERY  08/28/2015   Right eye  . KNEE SURGERY Left   . MASTECTOMY     bilateral  . Open surgery for bowel obstruction, 2000's    . RIGHT/LEFT HEART CATH AND CORONARY ANGIOGRAPHY N/A 12/29/2017   Procedure: RIGHT/LEFT HEART CATH AND CORONARY ANGIOGRAPHY;  Surgeon: Martinique, Peter M, MD;  Location: [facility];  Service: Cardiovascular;  Laterality: N/A;     A IV Location/Drains/Wounds Patient Lines/Drains/Airways Status   Active Line/Drains/Airways    Name:   Placement date:   Placement time:   Site:   Days:  Peripheral IV 05/07/18 Right Forearm   05/07/18    1634    Forearm   less than 1   Pressure Injury 05/07/18 Stage I -  Intact skin with non-blanchable redness of a localized area usually over a bony prominence. 5cmX7cm redness, there is a black scab that is about 1X1 cm   05/07/18    1658     less than 1          Intake/Output Last 24 hours No intake or output data in the 24 hours ending 05/07/18 2021  Labs/Imaging Results for orders placed or performed during the hospital encounter of 05/07/18 (from the  past 48 hour(s))  CBC with Differential/Platelet     Status: Abnormal   Collection Time: 05/07/18  2:32 PM  Result Value Ref Range   WBC 6.9 4.0 - 10.5 K/uL   RBC 2.76 (L) 3.87 - 5.11 MIL/uL   Hemoglobin 7.8 (L) 12.0 - 15.0 g/dL   HCT 26.1 (L) 36.0 - 46.0 %   MCV 94.6 80.0 - 100.0 fL   MCH 28.3 26.0 - 34.0 pg   MCHC 29.9 (L) 30.0 - 36.0 g/dL   RDW 14.6 11.5 - 15.5 %   Platelets 118 (L) 150 - 400 K/uL    Comment: REPEATED TO VERIFY PLATELET COUNT CONFIRMED BY SMEAR SPECIMEN CHECKED FOR CLOTS Immature Platelet Fraction may be clinically indicated, consider ordering this additional test [id number]    nRBC 0.0 0.0 - 0.2 %   Neutrophils Relative % 77 %   Neutro Abs 5.3 1.7 - 7.7 K/uL   Lymphocytes Relative 15 %   Lymphs Abs 1.0 0.7 - 4.0 K/uL   Monocytes Relative 6 %   Monocytes Absolute 0.4 0.1 - 1.0 K/uL   Eosinophils Relative 2 %   Eosinophils Absolute 0.2 0.0 - 0.5 K/uL   Basophils Relative 0 %   Basophils Absolute 0.0 0.0 - 0.1 K/uL   Immature Granulocytes 0 %   Abs Immature Granulocytes 0.01 0.00 - 0.07 K/uL    Comment: Performed at [facility], [address] [place], [place] [address]  Comprehensive metabolic panel     Status: Abnormal   Collection Time: 05/07/18  2:32 PM  Result Value Ref Range   Sodium 142 135 - 145 mmol/L   Potassium 5.6 (H) 3.5 - 5.1 mmol/L   Chloride 103 98 - 111 mmol/L   CO2 30 22 - 32 mmol/L   Glucose, Bld 190 (H) 70 - 99 mg/dL   BUN 120 (H) 8 - 23 mg/dL   Creatinine, Ser 2.37 (H) 0.44 - 1.00 mg/dL   Calcium 8.7 (L) 8.9 - 10.3 mg/dL   Total Protein 5.7 (L) 6.5 - 8.1 g/dL   Albumin 2.8 (L) 3.5 - 5.0 g/dL   AST 47 (H) 15 - 41 U/L   ALT 30 0 - 44 U/L   Alkaline Phosphatase 51 38 - 126 U/L   Total Bilirubin 0.9 0.3 - 1.2 mg/dL   GFR calc non Af Amer 21 (L) >60 mL/min   GFR calc Af Amer 24 (L) >60 mL/min   Anion gap 9 5 - 15    Comment: Performed at [facility], [place] [address] [place], [place] [address]  Lipase, blood      Status: None   Collection Time: 05/07/18  2:32 PM  Result Value Ref Range   Lipase 32 11 - 51 U/L    Comment: Performed at [facility], [place]  [address] [place], [place] [address]  Brain natriuretic peptide     Status: Abnormal   Collection Time: 05/07/18  3:55 PM  Result Value Ref Range   B Natriuretic Peptide 1,510.5 (H) 0.0 - 100.0 pg/mL    Comment: Performed at [facility] [address], [place], [place] [address]  SARS Coronavirus 2 [facility] order, Performed in [facility] hospital lab)     Status: None   Collection Time: 05/07/18  3:58 PM  Result Value Ref Range   SARS Coronavirus 2 NEGATIVE NEGATIVE    Comment: (NOTE) If result is NEGATIVE SARS-CoV-2 target nucleic acids are NOT DETECTED. The SARS-CoV-2 RNA is generally detectable in upper and lower  respiratory specimens during the acute phase of infection. The lowest  concentration of SARS-CoV-2 viral copies this assay can detect is 250  copies / mL. A negative result does not preclude SARS-CoV-2 infection  and should not be used as the sole basis for treatment or other  patient management decisions.  A negative result may occur with  improper specimen collection / handling, submission of specimen other  than nasopharyngeal swab, presence of viral mutation(s) within the  areas targeted by this assay, and inadequate number of viral copies  (<250 copies / mL). A negative result must be combined with clinical  observations, patient history, and epidemiological information. If result is POSITIVE SARS-CoV-2 target nucleic acids are DETECTED. The SARS-CoV-2 RNA is generally detectable in upper and lower  respiratory specimens dur ing the acute phase of infection.  Positive  results are indicative of active infection with SARS-CoV-2.  Clinical  correlation with patient history and other diagnostic information is  necessary to determine patient infection status.  Positive results do  not rule out bacterial  infection or co-infection with other viruses. If result is PRESUMPTIVE POSTIVE SARS-CoV-2 nucleic acids MAY BE PRESENT.   A presumptive positive result was obtained on the submitted specimen  and confirmed on repeat testing.  While 2019 novel coronavirus  (SARS-CoV-2) nucleic acids may be present in the submitted sample  additional confirmatory testing may be necessary for epidemiological  and / or clinical management purposes  to differentiate between  SARS-CoV-2 and other Sarbecovirus currently known to infect humans.  If clinically indicated additional testing with an alternate test  methodology [phone]) is advised. The SARS-CoV-2 RNA is generally  detectable in upper and lower respiratory sp ecimens during the acute  phase of infection. The expected result is Negative. Fact Sheet for Patients:  StrictlyIdeas.no Fact Sheet for Healthcare Providers: BankingDealers.co.za This test is not yet approved or cleared by the [place] FDA and has been authorized for detection and/or diagnosis of SARS-CoV-2 by FDA under an Emergency Use Authorization (EUA).  This EUA will remain in effect (meaning this test can be used) for the duration of the COVID-19 declaration under Section 564(b)(1) of the Act, 21 U.S.C. section 360bbb-3(b)(1), unless the authorization is terminated or revoked sooner. Performed at [facility], [place] [address] [place], [place] [address]   POC occult blood, ED RN will collect     Status: None   Collection Time: 05/07/18  4:10 PM  Result Value Ref Range   Fecal Occult Bld NEGATIVE NEGATIVE  Troponin I - ONCE - STAT     Status: Abnormal   Collection Time: 05/07/18  4:30 PM  Result Value Ref Range   Troponin I 0.04 (HH) <0.03 ng/mL    Comment: CRITICAL RESULT CALLED TO, READ BACK BY AND VERIFIED WITH:  CLINE,E RN [id number] 05/07/2018 WBOND Performed at [facility], [place] [address], [place], [place] [address]    Potassium     Status: Abnormal   Collection Time: 05/07/18  4:30 PM  Result Value Ref Range   Potassium 5.7 (H) 3.5 - 5.1 mmol/L    Comment: NO VISIBLE HEMOLYSIS Performed at [facility] [address], [place], [place] [address]   Type and screen [place]     Status: None   Collection Time: 05/07/18  4:35 PM  Result Value Ref Range   ABO/RH(D) B POS    Antibody Screen NEG    Sample Expiration      05/10/2018 Performed at [facility], [place] [address], [place], [place] [address]    Ct Chest Wo Contrast  Result Date: 05/07/2018 CLINICAL DATA:  Shortness of breath.  Dyspnea. EXAM: CT CHEST WITHOUT CONTRAST TECHNIQUE: Multidetector CT imaging of the chest was performed following the standard protocol without IV contrast. COMPARISON:  04/22/2018 FINDINGS: Cardiovascular: Heart size upper normal. Coronary artery calcification is evident. Atherosclerotic calcification is noted in the wall of the thoracic aorta. Mediastinum/Nodes: Stable mediastinal lymphadenopathy. 10 mm short axis precarinal node evident on 53/3. Hilar regions not well assessed given lack of intravenous contrast material. There is no axillary lymphadenopathy. The esophagus has normal imaging features. Lungs/Pleura: The central tracheobronchial airways are patent. Similar appearance right lower lobe collapse/consolidation with interval progression of posterior right middle lobe collapse. Left lower lobe collapse/consolidation seen previously is stable to minimally improved in the interval. Slight increase in moderate right pleural effusion with stable small left pleural effusion. Interlobular septal thickening and patchy ground-glass attenuation in the upper lobes is similar to prior. Upper Abdomen: Small volume fluid noted adjacent to the spleen and liver. Musculoskeletal: No worrisome lytic or sclerotic osseous abnormality. IMPRESSION: 1. No substantial interval change in exam. Some interval  improvement in aeration in the left lower lobe with worsening collapse in the right middle lobe and stable collapse/consolidation in the right lower lobe. 2. Slight increase in right pleural effusion now moderate in size with stable small left pleural effusion. 3. Interlobular septal thickening and patchy ground-glass attenuation in the upper lobes is similar to prior. 4.  Aortic Atherosclerois (ICD10-170.0) Electronically Signed   By: Misty Stanley M.D.   On: 05/07/2018 18:25   Dg Chest Port 1 View  Result Date: 05/07/2018 CLINICAL DATA:  Shortness of breath EXAM: PORTABLE CHEST 1 VIEW COMPARISON:  Chest radiograph and chest CT April 22, 2018 FINDINGS: There is extensive airspace consolidation throughout the right mid and lower lung zones with right pleural effusion. Left lung is clear. Heart is upper normal in size with pulmonary vascularity normal. No adenopathy. No bone lesions. IMPRESSION: Extensive airspace consolidation throughout much of the right lung consistent with multifocal pneumonia. Superimposed right effusion. Left lung is clear. Heart upper normal in size. No evident adenopathy. Electronically Signed   By: Lowella Grip III M.D.   On: 05/07/2018 14:23    Pending Labs Unresulted Labs (From admission, onward)    Start     Ordered   05/08/18 3716  Basic metabolic panel  Tomorrow morning,   R     05/07/18 1955   05/08/18 0500  CBC  Tomorrow morning,   R     05/07/18 1955   05/07/18 2007  Procalcitonin - Baseline  Add-on,   STAT     05/07/18 2006   05/07/18 2007  Urine rapid  drug screen (hosp performed)  Once,   R     05/07/18 2009   05/07/18 2006  Respiratory Panel by PCR  (Respiratory virus panel with precautions)  Once,   R     05/07/18 2005          Vitals/Pain Today's Vitals   05/07/18 1915 05/07/18 1930 05/07/18 1945 05/07/18 2000  BP: (!) 127/55 (!) 110/47 131/75 (!) 133/54  Pulse: 72 68 76 79  Resp: 16 10 13 20   Temp:      TempSrc:      SpO2: 97% 98% 99% 97%   PainSc:        Isolation Precautions Droplet precaution  Medications Medications  sodium chloride flush (NS) 0.9 % injection 3 mL (has no administration in time range)  acetaminophen (TYLENOL) tablet 650 mg (has no administration in time range)    Or  acetaminophen (TYLENOL) suppository 650 mg (has no administration in time range)  guaiFENesin (MUCINEX) 12 hr tablet 600 mg (has no administration in time range)  arformoterol (BROVANA) nebulizer solution 15 mcg (has no administration in time range)  atorvastatin (LIPITOR) tablet 80 mg (has no administration in time range)  budesonide (PULMICORT) nebulizer solution 0.5 mg (has no administration in time range)  busPIRone (BUSPAR) tablet 15 mg (has no administration in time range)  carvedilol (COREG) tablet 25 mg (has no administration in time range)  divalproex (DEPAKOTE) DR tablet 1,000 mg (has no administration in time range)  hydrALAZINE (APRESOLINE) tablet 100 mg (has no administration in time range)  isosorbide mononitrate (IMDUR) 24 hr tablet 30 mg (has no administration in time range)  levothyroxine (SYNTHROID) tablet 200 mcg (has no administration in time range)  pantoprazole (PROTONIX) EC tablet 40 mg (has no administration in time range)  insulin glargine (LANTUS) injection 10 Units (has no administration in time range)  insulin aspart (novoLOG) injection 0-9 Units (has no administration in time range)  furosemide (LASIX) injection 40 mg (has no administration in time range)  cefTRIAXone (ROCEPHIN) 1 g in sodium chloride 0.9 % 100 mL IVPB (has no administration in time range)  doxycycline (VIBRA-TABS) tablet 100 mg (has no administration in time range)    Mobility walks with device High fall risk   Focused Assessments Pulmonary Assessment Handoff:  Lung sounds: Bilateral Breath Sounds: Diminished L Breath Sounds: Clear R Breath Sounds: Clear O2 Device: Nasal Cannula O2 Flow Rate (L/min): 2  L/min      R Recommendations: See Admitting Provider Note  Report given to:   Additional Notes: While pt is oriented she is a poor historian.  Pt contradicts herself when reporting her conditions such as when she wears O2 and such.

## 2018-05-07 NOTE — ED Notes (Signed)
Attempted to call report

## 2018-05-07 NOTE — H&P (Signed)
History and Physical    Andrea [name] FXT:[id number] DOB: Feb 20, 1950 DOA: 05/07/2018  PCP: [facility], [facility]  Patient coming from: Home  I have personally briefly reviewed patient's old medical records in [place]  Chief Complaint: Shortness of breath and productive cough, generalized weakness  HPI: Andrea [name] is a 68 y.o. 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  Patient was recently admitted from 04/11/2018-04/26/2018 for acute respiratory failure with hypoxia secondary to multilobar pneumonia from RSV and CHF exacerbation.  She was treated with IV Zosyn and diuretics with improvement and discharged to [facility] rehab facility.  She apparently self discharged herself on 05/04/2018 and return to home.  She says she has been feeling generally weak and unable to get up out of bed due to weakness and progressive swelling in both of her legs.  She says she has been having a new productive cough of brown sputum since leaving a rehab facility.  She reports associated musculoskeletal chest pain with frequent coughing but denies typical chest pain.  She has been having increased orthopnea.  She reports one episode of nonbloody emesis on the day of admission.  She denies any subjective fevers, diaphoresis, chills, palpitations, abdominal pain, dysuria, diarrhea.  She saw small scant bloody sputum once.  She otherwise denies any obvious bleeding including epistaxis, hematemesis, hematuria, hematochezia, or melena.  Of note she has a history of CAD status post DES 12/2017 and was previously on Brilinta however  had vaginal bleeding, epistaxis, and blood-tinged phlegm.  Due to the symptoms on her last admission she would switch to Plavix due to her high risk of stent thrombosis without antiplatelet therapy.  ED Course:  In the ED, initial vitals showed BP 133/60, pulse 81, RR 19, temp 97.8 Fahrenheit, SPO2 100% on 2 L supplemental O2 via [place].  Labs are notable for WBC 6.9, hemoglobin 7.8, platelets 118, potassium 5.6, BUN 120, creatinine 2.37, GFR 21, BNP 1510.5, troponin I 0.04, FOBT negative, SARS-CoV-2 negative.  Portable chest x-ray showed airspace consolidation throughout the right lung with superimposed right effusion.    CT chest without contrast was obtained which did not show substantial interval change in exam compared to prior.  Some interval aeration of the left lower lobe with worsening collapse in the right middle lobe and stable consolidation of the right lower lobe were noted.  Right pleural effusion slightly increased from prior with stable small left pleural effusion.  Interlobular septal thickening and patchy groundglass in upper lobes is noted and similar to prior.  The hospitalist service was consulted to admit for further evaluation and management of acute on chronic CHF.  Review of Systems: As per HPI otherwise 10 point review of systems negative.    Past Medical History:  Diagnosis Date   Breast cancer ([place])    bilat mastectomy and LUE lymphadenectomy 2014, chemoRx 2014 - 15   CHF (congestive heart failure) (HCC)    Diabetes mellitus without complication ([place])    Esophageal reflux    Hypertension    Hypothyroid    Migraine    Seizure ([place])     Past Surgical History:  Procedure Laterality Date   ABDOMINAL HYSTERECTOMY  1984 , done for ruptured cyst and endometriosis   APPENDECTOMY     CHOLECYSTECTOMY     1980's   CORONARY STENT INTERVENTION N/A 12/30/2017   Procedure: CORONARY STENT INTERVENTION;  Surgeon: Martinique, Peter M, MD;  Location: [facility]  CV LAB;  Service: Cardiovascular;  Laterality: N/A;   EYE SURGERY  08/28/2015   Right eye   KNEE SURGERY Left    MASTECTOMY     bilateral   Open surgery for bowel obstruction, 2000's     RIGHT/LEFT HEART CATH AND CORONARY ANGIOGRAPHY N/A 12/29/2017   Procedure: RIGHT/LEFT HEART CATH AND CORONARY ANGIOGRAPHY;  Surgeon: Martinique, Peter M, MD;  Location: [place] [facility];  Service: Cardiovascular;  Laterality: N/A;     reports that she has never smoked. She has never used smokeless tobacco. She reports that she does not drink alcohol or use drugs.  Allergies  Allergen Reactions   Ticagrelor Rash   Aspartame And Phenylalanine Hives and Diarrhea   Aspirin Nausea And Vomiting   Maxzide [Hydrochlorothiazide W-Triamterene] Swelling    Fluid retention   Metformin And Related Diarrhea   Nsaids Nausea And Vomiting   Other Other (See Comments)    All steroids produce psychosis per pt Artificial sweeteners produce nausea and upset stomach.   Pravachol [Pravastatin] Other (See Comments)    unknown   Spironolactone    Stadol [Butorphanol] Other (See Comments)    Toradol, etc And related- hallucinations   Toradol [Ketorolac Tromethamine] Other (See Comments)    Hallucinations    Vistaril [Hydroxyzine Hcl] Other (See Comments)   Erythromycin Itching and Rash   Morphine And Related Hives and Rash    Morphine given via IV    Family History  Problem Relation Age of Onset   Sudden Cardiac Death Neg Hx      Prior to Admission medications   Medication Sig Start Date End Date Taking? Authorizing Provider  acetaminophen (TYLENOL) 325 MG tablet Take 2 tablets (650 mg total) by mouth every 6 (six) hours as needed for mild pain, fever or headache. 04/26/18   Georgette Shell, MD  arformoterol (BROVANA) 15 MCG/2ML NEBU Take 2 mLs (15 mcg total) by nebulization 2 (two) times daily. 04/26/18   Georgette Shell, MD  atorvastatin (LIPITOR) 80 MG tablet Take 1 tablet (80 mg  total) by mouth at bedtime. 01/28/18   Lendon Colonel, NP  budesonide (PULMICORT) 0.5 MG/2ML nebulizer solution Take 2 mLs (0.5 mg total) by nebulization 2 (two) times daily. 04/26/18   Georgette Shell, MD  busPIRone (BUSPAR) 15 MG tablet Take 15 mg by mouth 2 (two) times daily. 03/09/18 03/09/19  [provider]  carvedilol (COREG) 25 MG tablet Take 1 tablet (25 mg total) by mouth 2 (two) times daily with a meal. 04/26/18   Georgette Shell, MD  clopidogrel (PLAVIX) 75 MG tablet Take 1 tablet (75 mg total) by mouth daily. 04/26/18 04/26/19  Georgette Shell, MD  divalproex (DEPAKOTE) 500 MG DR tablet Take 1,000 mg by mouth daily.    [provider]  furosemide (LASIX) 40 MG tablet Take 1 tablet (40 mg total) by mouth daily. Start taking 04/30/2018 check bmp 04/28/2018 04/26/18 04/26/19  Georgette Shell, MD  guaiFENesin (MUCINEX) 600 MG 12 hr tablet Take 2 tablets (1,200 mg total) by mouth 2 (two) times daily. 04/26/18   Georgette Shell, MD  hydrALAZINE (APRESOLINE) 100 MG tablet Take 1 tablet (100 mg total) by mouth every 8 (eight) hours.  03/07/18   Lavina Hamman, MD  insulin glargine (LANTUS) 100 UNIT/ML injection Inject 0.1 mLs (10 Units total) into the skin at bedtime. 03/27/18   Debbe Odea, MD  isosorbide mononitrate (IMDUR) 30 MG 24 hr tablet Take 1 tablet (30 mg total) by mouth daily. 03/08/18   Lavina Hamman, MD  levothyroxine (SYNTHROID, LEVOTHROID) 200 MCG tablet Take 1 tablet (200 mcg total) by mouth at bedtime. Patient taking differently: Take 200 mcg by mouth daily before breakfast.  01/12/17   Lavina Hamman, MD  LORazepam (ATIVAN) 0.5 MG tablet Take 1 tablet (0.5 mg total) by mouth every 6 (six) hours as needed for anxiety. 04/26/18   Georgette Shell, MD  Melatonin 5 MG TABS Take 5 mg by mouth at bedtime.    [provider]  morphine (MS CONTIN) 15 MG 12 hr tablet Take 1 tablet (15 mg total) by mouth 2 (two) times daily. 04/26/18   Georgette Shell, MD  Multiple Vitamin (MULTIVITAMIN WITH MINERALS) TABS tablet Take 1 tablet by mouth daily. 03/27/18   Debbe Odea, MD  omeprazole (PRILOSEC) 20 MG capsule Take 20 mg by mouth daily.    [provider]  oxyCODONE (OXY IR/ROXICODONE) 5 MG immediate release tablet Take 1-2 tablets (5-10 mg total) by mouth every 4 (four) hours as needed for severe pain. 04/26/18   Georgette Shell, MD  predniSONE (DELTASONE) 10 MG tablet Take 4 tablets for the first 4 days then 3 tablets for the following 4 days then 2 tablets for the following 4 days and then 1 tablet daily till done 04/26/18   Georgette Shell, MD  senna-docusate (SENOKOT-S) 8.6-50 MG tablet Take 2 tablets by mouth 2 (two) times daily. 01/22/18   Geradine Girt, DO    Physical Exam: Vitals:   05/07/18 1930 05/07/18 1945 05/07/18 2000 05/07/18 2030  BP: (!) 110/47 131/75 (!) 133/54 (!) 115/98  Pulse: 68 76 79 73  Resp: 10 13 20 17   Temp:      TempSrc:      SpO2: 98% 99% 97% 95%    Constitutional: Chronically ill-appearing woman resting in bed, NAD, calm, comfortable Eyes: PERRL, lids and conjunctivae normal ENMT: Mucous membranes are moist. Posterior pharynx clear of any exudate or lesions.Normal dentition.  Neck: normal, supple, no masses. Respiratory: Bibasilar crackles, no obvious wheezing.  Normal respiratory effort. No accessory muscle use.  Intermittent coughing productive of brown blood-tinged sputum. Cardiovascular: Regular rate and rhythm, no murmurs / rubs / gallops.  2+ pitting edema up to the knees bilaterally. Abdomen: no tenderness, no masses palpated. No hepatosplenomegaly. Bowel sounds positive.  Musculoskeletal: no clubbing / cyanosis. No joint deformity upper and lower extremities. Good ROM, no contractures. Normal muscle tone.  Skin: no rashes, lesions, ulcers. No induration Neurologic: CN 2-12 grossly intact. Sensation intact, Strength grossly intact however diminished against prolonged  resistance. Psychiatric:  Alert and oriented x 3.  Slightly depressed and anxious mood.  Labs on Admission: I have personally reviewed following labs and imaging studies  CBC: Recent Labs  Lab 05/07/18 1432  WBC 6.9  NEUTROABS 5.3  HGB 7.8*  HCT 26.1*  MCV 94.6  PLT 852*   Basic Metabolic Panel: Recent Labs  Lab 05/07/18 1432 05/07/18 1630  NA 142  --   K 5.6* 5.7*  CL 103  --   CO2 30  --   GLUCOSE 190*  --   BUN 120*  --   CREATININE 2.37*  --  CALCIUM 8.7*  --    GFR: Estimated Creatinine Clearance: 22.5 mL/min (A) (by C-G formula based on SCr of 2.37 mg/dL (H)). Liver Function Tests: Recent Labs  Lab 05/07/18 1432  AST 47*  ALT 30  ALKPHOS 51  BILITOT 0.9  PROT 5.7*  ALBUMIN 2.8*   Recent Labs  Lab 05/07/18 1432  LIPASE 32   No results for input(s): AMMONIA in the last 168 hours. Coagulation Profile: No results for input(s): INR, PROTIME in the last 168 hours. Cardiac Enzymes: Recent Labs  Lab 05/07/18 1630  TROPONINI 0.04*   BNP (last 3 results) No results for input(s): PROBNP in the last 8760 hours. HbA1C: No results for input(s): HGBA1C in the last 72 hours. CBG: Recent Labs  Lab 05/07/18 2023  GLUCAP 148*   Lipid Profile: No results for input(s): CHOL, HDL, LDLCALC, TRIG, CHOLHDL, LDLDIRECT in the last 72 hours. Thyroid Function Tests: No results for input(s): TSH, T4TOTAL, FREET4, T3FREE, THYROIDAB in the last 72 hours. Anemia Panel: No results for input(s): VITAMINB12, FOLATE, FERRITIN, TIBC, IRON, RETICCTPCT in the last 72 hours. Urine analysis:    Component Value Date/Time   COLORURINE YELLOW 02/25/2018 2316   APPEARANCEUR HAZY (A) 02/25/2018 2316   LABSPEC 1.014 02/25/2018 2316   PHURINE 5.0 02/25/2018 2316   GLUCOSEU NEGATIVE 02/25/2018 2316   HGBUR SMALL (A) 02/25/2018 2316   BILIRUBINUR NEGATIVE 02/25/2018 2316   KETONESUR NEGATIVE 02/25/2018 2316   PROTEINUR >=300 (A) 02/25/2018 2316   UROBILINOGEN 1.0 12/02/2013  1612   NITRITE NEGATIVE 02/25/2018 2316   LEUKOCYTESUR SMALL (A) 02/25/2018 2316    Radiological Exams on Admission: Ct Chest Wo Contrast  Result Date: 05/07/2018 CLINICAL DATA:  Shortness of breath.  Dyspnea. EXAM: CT CHEST WITHOUT CONTRAST TECHNIQUE: Multidetector CT imaging of the chest was performed following the standard protocol without IV contrast. COMPARISON:  04/22/2018 FINDINGS: Cardiovascular: Heart size upper normal. Coronary artery calcification is evident. Atherosclerotic calcification is noted in the wall of the thoracic aorta. Mediastinum/Nodes: Stable mediastinal lymphadenopathy. 10 mm short axis precarinal node evident on 53/3. Hilar regions not well assessed given lack of intravenous contrast material. There is no axillary lymphadenopathy. The esophagus has normal imaging features. Lungs/Pleura: The central tracheobronchial airways are patent. Similar appearance right lower lobe collapse/consolidation with interval progression of posterior right middle lobe collapse. Left lower lobe collapse/consolidation seen previously is stable to minimally improved in the interval. Slight increase in moderate right pleural effusion with stable small left pleural effusion. Interlobular septal thickening and patchy ground-glass attenuation in the upper lobes is similar to prior. Upper Abdomen: Small volume fluid noted adjacent to the spleen and liver. Musculoskeletal: No worrisome lytic or sclerotic osseous abnormality. IMPRESSION: 1. No substantial interval change in exam. Some interval improvement in aeration in the left lower lobe with worsening collapse in the right middle lobe and stable collapse/consolidation in the right lower lobe. 2. Slight increase in right pleural effusion now moderate in size with stable small left pleural effusion. 3. Interlobular septal thickening and patchy ground-glass attenuation in the upper lobes is similar to prior. 4.  Aortic Atherosclerois (ICD10-170.0)  Electronically Signed   By: Misty Stanley M.D.   On: 05/07/2018 18:25   Dg Chest Port 1 View  Result Date: 05/07/2018 CLINICAL DATA:  Shortness of breath EXAM: PORTABLE CHEST 1 VIEW COMPARISON:  Chest radiograph and chest CT April 22, 2018 FINDINGS: There is extensive airspace consolidation throughout the right mid and lower lung zones with right pleural effusion. Left lung  is clear. Heart is upper normal in size with pulmonary vascularity normal. No adenopathy. No bone lesions. IMPRESSION: Extensive airspace consolidation throughout much of the right lung consistent with multifocal pneumonia. Superimposed right effusion. Left lung is clear. Heart upper normal in size. No evident adenopathy. Electronically Signed   By: Lowella Grip III M.D.   On: 05/07/2018 14:23    EKG: Independently reviewed. Sinus rhythm, nonspecific T wave changes lateral leads, voltage in V1-V2 decreased from prior.  Assessment/Plan Principal Problem:   Acute on chronic diastolic congestive heart failure (HCC) Active Problems:   Essential hypertension   Insulin dependent diabetes mellitus (HCC)   Hypothyroidism   History of seizures   Chronic pain   CAD S/P percutaneous coronary angioplasty   Anemia   GAD (generalized anxiety disorder)   Hyperkalemia   CKD (chronic kidney disease), stage III (HCC)  Tyshawna Alarid is a 68 y.o. 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   Acute on chronic diastolic CHF: Patient with significant volume overload on admission and slightly increased right pleural effusion from prior although BNP is improved from  last admission. -Start IV Lasix 40 mg twice daily -Strict I/O's, daily weights -Continue home carvedilol  Multifocal pneumonia: Recently admitted with RSV multifocal pneumonia and treated with IV Zosyn.  CT imaging is relatively stable, however she reports new cough productive of brown blood-tinged sputum.  She is afebrile without leukocytosis and at time of admission saturating well on room air.  Imaging findings may be residual from prior pneumonia, however with new cough and possible secondary bacterial pneumonia will empirically treat with antibiotics for now. -Start IV ceftriaxone and oral doxycycline -Check procalcitonin, de-escalate antibiotics as able - SARS-CoV-2 is negative -RVP ordered and pending  Acute on chronic normocytic anemia: Unclear etiology, possibly related to antiplatelet with Plavix use and anemia of chronic disease.  FOBT is negative, however BUN is rising up at 120 compared to 8311 days ago suggesting possible slow upper GI bleed.  Platelets are also decreased to 118.  Given her recent DES placement December 2019 and high risk of stent thrombosis without antiplatelet use, I will continue Plavix for now.  Her goal hemoglobin is 8 therefore we will also transfuse 1 unit. -Transfuse 1 unit PRBC slowly to avoid TACO, also giving lasix as above -Continue Plavix, may need further cardiology and GI input -Follow-up posttransfusion H&H, monitor for further signs/symptoms of bleeding -SCDs for VTE prophylaxis  CKD stage III/IV: Creatinine and GFR relatively stable compared to recent baseline however slowly worsening over the last 6 months.  Likely multifactorial from diabetes, CHF, and hypertension. -Continue to monitor with diuresis  Hyperkalemia: K 5.6 on admission.  No acute EKG changes. -Anticipate this will improve with Lasix diuresis, recheck in a.m.  CAD s/p PCI of proximal and distal LAD w/ DES x 2 (12/30/2017): Chronic and stable without typical chest pain.   Patient not on aspirin due to prior allergy.  She was previously on Brilinta after DES placement however this was switched to Plavix on recent admission due to bleeding.  It was recommended she continue Plavix due to risk for stent thrombosis without antiplatelet therapy.  Troponins 0.04, not significantly elevated.  EKG without acute  ischemic changes. -Continue Plavix as above for now. -Continue carvedilol, Imdur, hydralazine, atorvastatin  Insulin-dependent diabetes: A1c 6.4 on 03/15/2018. -Continue home Lantus plus sensitive SSI  Hypertension: Currently stable. -Continue carvedilol, Imdur, hydralazine and IV Lasix as above  Hyperlipidemia: -Continue atorvastatin  Hypothyroidism: TSH 1.937 on 03/18/2018. -Continue Synthroid  Seizure disorder: Currently stable. -Continue Depakote  Generalized anxiety disorder: Chronic and stable.  She is prescribed lorazepam as an outpatient.  We will continue for now at reduced frequency as it is likely contributing to her generalized weakness/deconditioning. -Continue BuSpar -Continue Ativan 0.5 mg, reduce frequency to q8h prn  Chronic back pain: Has been taking long-acting morphine and short-acting narcotics.  Will continue both for now with hold parameters, may be contributing to her generalized weakness/deconditioning. -Continue morphine 15 mg p.o. twice daily -Continue OxyIR 5 mg, reduce frequency to every 6 hours as needed  Generalized weakness/deconditioning: Multifactorial from all of her chronic medical conditions as above.  Patient apparently self discharged from [facility] rehab facility on 05/04/2018. -PT/OT eval -Likely will need SNF on discharge  DVT prophylaxis: SCDs Code Status: Full code, confirmed with patient. Family Communication: None present on admission Disposition Plan: Pending clinical progress, PT/OT eval Consults called: None Admission status: Inpatient, patient likely requires greater than 2 midnight length stay  for management of acute on chronic CHF, acute on chronic anemia, and multifocal pneumonia as she is high risk for decompensation and/or adverse event given her comorbidities which include CAD, CKD, HTN, and insulin-dependent diabetes.   Zada Finders MD [facility] Pager [phone]  If 7PM-7AM, please contact night-coverage [email or web address]  05/07/2018, 9:14 PM

## 2018-05-07 NOTE — ED Triage Notes (Signed)
Per [facility], pt d/c from here last week to [facility] after dx of pneumonia. Has continued to have generalized weakness and extremity edema worse, wishes to be sent to a different rehab facility. Axox4. VSS, CBG 254. Wears 2L [place] at all times.

## 2018-05-07 NOTE — Progress Notes (Signed)
CSW consulted as pt is refusing to go back to rehab facility. CSW spoke with pt at bedside. Pt reported that CSW's stuck pt in the facility on Sunday 04/26/2018 with out her permission but with her son's permission. CSw apologized to pt on behalf of CSW 's and asked pt how CSW could assist. Pt reported that she didn't wan t to go back to [facility] as they made her stay in her room. CSW suggested that this could have been associated with the facility attempting to [facility] all residences safe for the COVID-19 virus. Pt expressed that she understood this but still didn't want to go back.    CSW spoke with Tammy from [facility] and was advised that pt discharged from their facility on 4/13/202 as pt was admament to leave. Tammy reported that pt called her son to come and pick her up and son did. Tammy expressed that son is support for pt however he doesn't live with pt. CSW was advised that pt was set up with [facility] and [facility] for Nebulizer. Pt expressed a desire to not be placed into another facility but did request that more services be added for her. CSW updated MD and spoke with Butch Penny from [facility] and was advised that they would add a Education officer, museum along with pt's OT, OT, however they are unable to get pt a NT as staff is short and pt's insurance wont cover this. CSW has updated MD. At this time there are no further CSW needs as pt plans to return home with further [place] arranged.     Virgie Dad Brixton Franko, MSW, [place] [facility] Clinical Social Worker [phone]

## 2018-05-07 NOTE — ED Notes (Signed)
Patient transported to CT 

## 2018-05-08 DIAGNOSIS — Z9861 Coronary angioplasty status: Secondary | ICD-10-CM

## 2018-05-08 DIAGNOSIS — R569 Unspecified convulsions: Secondary | ICD-10-CM

## 2018-05-08 DIAGNOSIS — R600 Localized edema: Secondary | ICD-10-CM

## 2018-05-08 DIAGNOSIS — I2510 Atherosclerotic heart disease of native coronary artery without angina pectoris: Secondary | ICD-10-CM

## 2018-05-08 DIAGNOSIS — Z794 Long term (current) use of insulin: Secondary | ICD-10-CM

## 2018-05-08 DIAGNOSIS — E119 Type 2 diabetes mellitus without complications: Secondary | ICD-10-CM

## 2018-05-08 DIAGNOSIS — N183 Chronic kidney disease, stage 3 (moderate): Secondary | ICD-10-CM

## 2018-05-08 LAB — CBC
HCT: 30.2 % — ABNORMAL LOW (ref 36.0–46.0)
Hemoglobin: 9.6 g/dL — ABNORMAL LOW (ref 12.0–15.0)
MCH: 29.5 pg (ref 26.0–34.0)
MCHC: 31.8 g/dL (ref 30.0–36.0)
MCV: 92.9 fL (ref 80.0–100.0)
Platelets: 129 10*3/uL — ABNORMAL LOW (ref 150–400)
RBC: 3.25 MIL/uL — ABNORMAL LOW (ref 3.87–5.11)
RDW: 14.8 % (ref 11.5–15.5)
WBC: 8.3 10*3/uL (ref 4.0–10.5)
nRBC: 0 % (ref 0.0–0.2)

## 2018-05-08 LAB — PREPARE RBC (CROSSMATCH)

## 2018-05-08 LAB — RESPIRATORY PANEL BY PCR

## 2018-05-08 LAB — MRSA PCR SCREENING: MRSA by PCR: POSITIVE — AB

## 2018-05-08 LAB — RAPID URINE DRUG SCREEN, HOSP PERFORMED
Amphetamines: NOT DETECTED
Barbiturates: NOT DETECTED
Benzodiazepines: POSITIVE — AB
Cocaine: NOT DETECTED
Opiates: POSITIVE — AB
Tetrahydrocannabinol: NOT DETECTED

## 2018-05-08 LAB — BASIC METABOLIC PANEL
Anion gap: 12 (ref 5–15)
BUN: 113 mg/dL — ABNORMAL HIGH (ref 8–23)
CO2: 28 mmol/L (ref 22–32)
Calcium: 8.6 mg/dL — ABNORMAL LOW (ref 8.9–10.3)
Chloride: 103 mmol/L (ref 98–111)
Creatinine, Ser: 2.07 mg/dL — ABNORMAL HIGH (ref 0.44–1.00)
GFR calc Af Amer: 28 mL/min — ABNORMAL LOW (ref >60)
GFR calc non Af Amer: 24 mL/min — ABNORMAL LOW (ref >60)
Glucose, Bld: 168 mg/dL — ABNORMAL HIGH (ref 70–99)
Potassium: 5 mmol/L (ref 3.5–5.1)
Sodium: 143 mmol/L (ref 135–145)

## 2018-05-08 LAB — PROCALCITONIN: Procalcitonin: 0.11 ng/mL

## 2018-05-08 LAB — GLUCOSE, CAPILLARY
Glucose-Capillary: 206 mg/dL — ABNORMAL HIGH (ref 70–99)
Glucose-Capillary: 152 mg/dL — ABNORMAL HIGH (ref 70–99)
Glucose-Capillary: 139 mg/dL — ABNORMAL HIGH (ref 70–99)
Glucose-Capillary: 153 mg/dL — ABNORMAL HIGH (ref 70–99)

## 2018-05-08 MED ORDER — SENNOSIDES-DOCUSATE SODIUM 8.6-50 MG PO TABS
1.0000 | ORAL_TABLET | Freq: Two times a day (BID) | ORAL | Status: DC
  Administered 2018-05-09 – 2018-05-11 (×5): 1 via ORAL
  Filled 2018-05-08 (×7): qty 1

## 2018-05-08 MED ORDER — CHLORHEXIDINE GLUCONATE CLOTH 2 % EX PADS
6.0000 | MEDICATED_PAD | Freq: Every day | CUTANEOUS | Status: AC
  Administered 2018-05-08 – 2018-05-12 (×3): 6 via TOPICAL

## 2018-05-08 MED ORDER — ENOXAPARIN SODIUM 30 MG/0.3ML ~~LOC~~ SOLN
30.0000 mg | SUBCUTANEOUS | Status: DC
  Administered 2018-05-08 – 2018-05-12 (×5): 30 mg via SUBCUTANEOUS
  Filled 2018-05-08 (×5): qty 0.3

## 2018-05-08 MED ORDER — CLONAZEPAM 0.25 MG PO TBDP
0.2500 mg | ORAL_TABLET | Freq: Two times a day (BID) | ORAL | Status: DC | PRN
  Administered 2018-05-09 – 2018-05-21 (×9): 0.25 mg via ORAL
  Filled 2018-05-08 (×9): qty 1

## 2018-05-08 MED ORDER — SODIUM CHLORIDE 0.9 % IV SOLN
INTRAVENOUS | Status: DC | PRN
  Administered 2018-05-08: 250 mL via INTRAVENOUS

## 2018-05-08 MED ORDER — HYDRALAZINE HCL 25 MG PO TABS
25.0000 mg | ORAL_TABLET | Freq: Three times a day (TID) | ORAL | Status: DC
  Administered 2018-05-08 – 2018-05-12 (×11): 25 mg via ORAL
  Filled 2018-05-08 (×11): qty 1

## 2018-05-08 MED ORDER — AMOXICILLIN-POT CLAVULANATE 500-125 MG PO TABS
1.0000 | ORAL_TABLET | Freq: Two times a day (BID) | ORAL | Status: DC
  Administered 2018-05-08 – 2018-05-10 (×4): 500 mg via ORAL
  Filled 2018-05-08 (×6): qty 1

## 2018-05-08 MED ORDER — MUPIROCIN 2 % EX OINT
1.0000 "application " | TOPICAL_OINTMENT | Freq: Two times a day (BID) | CUTANEOUS | Status: AC
  Administered 2018-05-08 – 2018-05-12 (×10): 1 via NASAL
  Filled 2018-05-08: qty 22

## 2018-05-08 NOTE — Clinical Social Work Note (Signed)
CSW acknowledges SNF consult. Please see ED CSW's note from yesterday. Sent message to MD. Patient is in copay days which would cost around $160 per day if she discharges to SNF again. She does not have secondary insurance to cover this.  Andrea [name], [place]

## 2018-05-08 NOTE — Evaluation (Signed)
Occupational Therapy Evaluation Patient Details Name: Andrea [name] MRN: [id number] DOB: [birth year] Today's Date: 05/08/2018    History of Present Illness 68 yo admitted with SOB and weakness from home after leaving SNF 4/13. Pt with CHF and PNA. Pt D/C from hospital to [place] on 4/5. PMhx: CHF, CAD, breast CA, bil mastectomy, HTn, seizure, migraine, IDDM, CKD   Clinical Impression   Pt went to SNF after recent hospitalization short term and "they didn't do anything for me there" but also admits to falls at home and 'I just can't get myself back up when I fall" sponge bathing and using RW for mobility. Pt is currently min guard for transfers and in room mobility with RW, min guard for standing grooming tasks at sink and overall min guard to min A for ADL. Pt does present as a very high fall risk and needs SNF level therapy - but due to co-pay and financial implications will recommend HHOT at this time. OT will follow acutely.    Follow Up Recommendations  SNF;Home health OT    Equipment Recommendations  None recommended by OT(Pt has appropriate DME)    Recommendations for Other Services       Precautions / Restrictions Precautions Precautions: Fall Restrictions Weight Bearing Restrictions: No      Mobility Bed Mobility Overal bed mobility: Needs Assistance Bed Mobility: Supine to Sit     Supine to sit: Supervision     General bed mobility comments: increased time and effort - no physical assist needed  Transfers Overall transfer level: Needs assistance Equipment used: Rolling walker (2 wheeled) Transfers: Sit to/from [facility] Sit to Stand: Min guard Stand pivot transfers: Min guard       General transfer comment: vc for safe hand placement with RW    Balance Overall balance assessment: Needs assistance Sitting-balance support: No upper extremity supported;Feet supported Sitting balance-Leahy Scale: Good     Standing balance support:  Single extremity supported;Bilateral upper extremity supported;During functional activity Standing balance-Leahy Scale: Fair Standing balance comment: able to stand without UE support, requires RW for dynamic standing activity.                            ADL either performed or assessed with clinical judgement   ADL Overall ADL's : Needs assistance/impaired Eating/Feeding: Set up;Sitting   Grooming: Wash/dry hands;Min guard;Standing   Upper Body Bathing: Supervision/ safety;Sitting   Lower Body Bathing: Minimal assistance   Upper Body Dressing : Set up   Lower Body Dressing: Minimal assistance   Toilet Transfer: Min guard;Ambulation;RW Toilet Transfer Details (indicate cue type and reason): on RA, close min guard for safety Toileting- Clothing Manipulation and Hygiene: Supervision/safety;Sit to/from stand       Functional mobility during ADLs: Min guard;Rolling walker General ADL Comments: Pt encouraged to walk to bathroom WITH staff assist     Vision         Perception     Praxis      Pertinent Vitals/Pain Pain Assessment: No/denies pain Faces Pain Scale: No hurt Pain Intervention(s): Monitored during session;Repositioned     Hand Dominance Right   Extremity/Trunk Assessment Upper Extremity Assessment Upper Extremity Assessment: Generalized weakness   Lower Extremity Assessment Lower Extremity Assessment: Generalized weakness   Cervical / Trunk Assessment Cervical / Trunk Assessment: Normal   Communication Communication Communication: No difficulties   Cognition Arousal/Alertness: Awake/alert Behavior During Therapy: WFL for tasks assessed/performed Overall Cognitive Status: No family/caregiver present  to determine baseline cognitive functioning Area of Impairment: Safety/judgement;Problem solving;Awareness                         Safety/Judgement: Decreased awareness of safety Awareness: Emergent Problem Solving: Decreased  initiation;Slow processing;Requires verbal cues General Comments: "when I fall at home, I can't get off the floor...can you help me with that?" when asked about how she was eating/preparing food and managing IADL at home she got irritated "just bring me another coloring book"   General Comments  O2 saturation remained at or above 94% on RA throughout session    Exercises     Shoulder Instructions      Home Living Family/patient expects to be discharged to:: Private residence Living Arrangements: Alone Available Help at Discharge: Family;Available PRN/intermittently(2 sons (adult)) Type of Home: Apartment Home Access: Level entry     Home Layout: One level     Bathroom Shower/Tub: Tub only(only garden tub)   Bathroom Toilet: Standard     Home Equipment: Bedside commode;Walker - 2 wheels;Shower seat;Grab bars - tub/shower   Additional Comments: went to SNF for 3 days after last admission "They didn't do anything for me there"      Prior Functioning/Environment Level of Independence: Needs assistance  Gait / Transfers Assistance Needed: "I can't move and get up around my home- that's why I am here" typically uses a RW ADL's / Homemaking Assistance Needed: sponge bathes, struggles at home            OT Problem List: Decreased activity tolerance;Impaired balance (sitting and/or standing);Decreased safety awareness;Decreased knowledge of use of DME or AE;Decreased knowledge of precautions;Cardiopulmonary status limiting activity;Increased edema;Decreased strength;Decreased range of motion;Decreased cognition      OT Treatment/Interventions: Self-care/ADL training;Therapeutic exercise;DME and/or AE instruction;Therapeutic activities;Patient/family education;Balance training;Energy conservation;Cognitive remediation/compensation    OT Goals(Current goals can be found in the care plan section) Acute Rehab OT Goals Patient Stated Goal: get stronger OT Goal Formulation: With  patient Time For Goal Achievement: 05/22/18 Potential to Achieve Goals: Good ADL Goals Pt Will Perform Grooming: with modified independence;standing Pt Will Perform Upper Body Dressing: with modified independence;sitting Pt Will Perform Lower Body Dressing: with modified independence;sit to/from stand Pt Will Transfer to Toilet: with modified independence;ambulating Pt Will Perform Toileting - Clothing Manipulation and hygiene: with modified independence;sitting/lateral leans Additional ADL Goal #1: Pt will recall 3 energy conservation techniques for ADL at independent level  OT Frequency: Min 3X/week   Barriers to D/C: Decreased caregiver support  Pt REALLY needs to be at a SNF as she lives alone - however she cannot afford the co-pay       Co-evaluation              AM-PAC OT "6 Clicks" Daily Activity     Outcome Measure Help from another person eating meals?: None Help from another person taking care of personal grooming?: A Little Help from another person toileting, which includes using toliet, bedpan, or urinal?: A Little Help from another person bathing (including washing, rinsing, drying)?: A Little Help from another person to put on and taking off regular upper body clothing?: None Help from another person to put on and taking off regular lower body clothing?: A Little 6 Click Score: 20   End of Session Equipment Utilized During Treatment: Rolling walker;Gait belt Nurse Communication: Mobility status;Other (comment)(removed O2)  Activity Tolerance: Patient tolerated treatment well Patient left: in chair;with call bell/phone within reach  OT Visit Diagnosis: Muscle weakness (  generalized) (M62.81);Adult, failure to thrive (R62.7)                Time: 1015-1100 OT Time Calculation (min): 45 min Charges:  OT General Charges $OT Visit: 1 Visit OT Evaluation $OT Eval Moderate Complexity: 1 Mod OT Treatments $Self Care/Home Management : 8-22 mins $Therapeutic  Activity: 8-22 mins  Hulda Humphrey OTR/L Acute Rehabilitation Services Pager: [phone] Office: [place] 05/08/2018, 12:14 PM

## 2018-05-08 NOTE — Evaluation (Addendum)
Physical Therapy Evaluation Patient Details Name: Andrea [name] MRN: [id number] DOB: 09/27/1950 Today's Date: 05/08/2018   History of Present Illness  68 yo admitted with SOB and weakness from home after leaving SNF 4/13. Pt with CHF and PNA. Pt D/C from hospital to [facility] on 4/5. PMhx: CHF, CAD, breast CA, bil mastectomy, HTn, seizure, migraine, IDDM, CKD  Clinical Impression  Pt pleasant and willing to move. Pt very inconsistent throughout session stating that she didn't like rehab because she couldn't move but would be willing to return during the covid restrictions because she falls at home and can't get up. Then, the pt will state sons will not help her but she will just have to go home and hope they will and that she needs to be able to have rehab before home. Pt with noted bil LE edema, weakness and decreased activity tolerance who would benefit from ST-SNF to increase pt function and education. Pt reports she was eating canned soup and cereal at home and when asked if she knew she should be restricting sodium she initially said no then yes then stated she will eat the soup anyway because those are the things she can fix. If pt declines SNF then HHPT recommended with an aide. Pt educated for need to charge phone at night and keep it with her when she moves during the day at home as well. Pt also educated for removing socks as noted pressure from edema when not moving with legs elevated end of session. Pt demonstrates significant lack of understanding of CHF maintenance and restrictions.  SpO2 93-95% on RA throughout session HR 74-79    Follow Up Recommendations SNF;Home health PT    Equipment Recommendations  None recommended by PT    Recommendations for Other Services       Precautions / Restrictions Precautions Precautions: Fall Restrictions Weight Bearing Restrictions: No      Mobility  Bed Mobility Overal bed mobility: Needs Assistance Bed Mobility: Supine to Sit      Supine to sit: Supervision     General bed mobility comments: in chair on arrival  Transfers Overall transfer level: Modified independent Equipment used: Rolling walker (2 wheeled) Transfers: Sit to/from [facility] Sit to Stand: Min guard Stand pivot transfers: Min guard       General transfer comment: safety use of hand placement and backing to surface  Ambulation/Gait Ambulation/Gait assistance: Min guard Gait Distance (Feet): 300 Feet Assistive device: Rolling walker (2 wheeled) Gait Pattern/deviations: Step-through pattern;Decreased stride length;Trunk flexed;Drifts right/left   Gait velocity interpretation: >2.62 ft/sec, indicative of community ambulatory General Gait Details: decreased speed with slightly flexed trunk and cues for positioning in RW and hallway as pt tends to get too close to wall no standing rests  Stairs            Wheelchair Mobility    Modified Rankin (Stroke Patients Only)       Balance Overall balance assessment: Needs assistance Sitting-balance support: No upper extremity supported;Feet supported Sitting balance-Leahy Scale: Good     Standing balance support: Bilateral upper extremity supported;During functional activity Standing balance-Leahy Scale: Fair Standing balance comment: bil UE support on RW                             Pertinent Vitals/Pain Pain Assessment: No/denies pain Faces Pain Scale: No hurt Pain Intervention(s): Monitored during session;Repositioned    Home Living Family/patient expects to be discharged to:: Private  residence Living Arrangements: Alone Available Help at Discharge: Family;Available PRN/intermittently Type of Home: Apartment Home Access: Level entry     Home Layout: One level Home Equipment: Bedside commode;Walker - 2 wheels;Shower seat;Grab bars - tub/shower Additional Comments: went to SNF for 3 days after last admission "They didn't do anything for me there"     Prior Function Level of Independence: Needs assistance   Gait / Transfers Assistance Needed: RW for gait  ADL's / Homemaking Assistance Needed: sponge bathes, struggles at home        Hand Dominance   Dominant Hand: Right    Extremity/Trunk Assessment   Upper Extremity Assessment Upper Extremity Assessment: Generalized weakness    Lower Extremity Assessment Lower Extremity Assessment: Generalized weakness    Cervical / Trunk Assessment Cervical / Trunk Assessment: Normal  Communication   Communication: No difficulties  Cognition Arousal/Alertness: Awake/alert Behavior During Therapy: WFL for tasks assessed/performed Overall Cognitive Status: No family/caregiver present to determine baseline cognitive functioning Area of Impairment: Safety/judgement;Problem solving;Awareness;Memory                   Current Attention Level: Selective Memory: Decreased short-term memory Following Commands: Follows one step commands consistently Safety/Judgement: Decreased awareness of safety Awareness: Emergent Problem Solving: Slow processing General Comments: pt very fluctuant in responses stating her sons wont help her and she can't go home then the next stating she has to go home and she will get them to help      General Comments General comments (skin integrity, edema, etc.): O2 saturation remained at or above 94% on RA throughout session    Exercises     Assessment/Plan    PT Assessment Patient needs continued PT services  PT Problem List Decreased strength;Decreased knowledge of precautions;Decreased mobility;Decreased knowledge of use of DME;Decreased activity tolerance;Decreased safety awareness;Decreased balance       PT Treatment Interventions DME instruction;Gait training;Functional mobility training;Therapeutic activities;Therapeutic exercise;Balance training;Patient/family education    PT Goals (Current goals can be found in the Care Plan section)   Acute Rehab PT Goals Patient Stated Goal: get stronger and go home PT Goal Formulation: With patient Time For Goal Achievement: 05/22/18 Potential to Achieve Goals: Fair    Frequency Min 3X/week   Barriers to discharge Decreased caregiver support      Co-evaluation               AM-PAC PT "6 Clicks" Mobility  Outcome Measure Help needed turning from your back to your side while in a flat bed without using bedrails?: None Help needed moving from lying on your back to sitting on the side of a flat bed without using bedrails?: A Little Help needed moving to and from a bed to a chair (including a wheelchair)?: None Help needed standing up from a chair using your arms (e.g., wheelchair or bedside chair)?: None Help needed to walk in hospital room?: A Little Help needed climbing 3-5 steps with a railing? : A Little 6 Click Score: 21    End of Session Equipment Utilized During Treatment: Gait belt Activity Tolerance: Patient tolerated treatment well Patient left: in chair;with call bell/phone within reach Nurse Communication: Mobility status PT Visit Diagnosis: Muscle weakness (generalized) (M62.81);Unsteadiness on feet (R26.81);Other abnormalities of gait and mobility (R26.89)    Time: 0350-0938 PT Time Calculation (min) (ACUTE ONLY): 21 min   Charges:   PT Evaluation $PT Eval Moderate Complexity: 1 Mod          [place], PT Acute Rehabilitation  Services Pager: [phone] Office: [place] 05/08/2018, 2:09 PM

## 2018-05-08 NOTE — Progress Notes (Signed)
Patient with home medications at bedside.  Will send majority of medications to pharmacy for safe keeping.  Patient refuses to allow medications in purse to go to pharmacy.  Purse is in the closet as patient agreed to.

## 2018-05-08 NOTE — TOC Initial Note (Signed)
Transition of Care [facility]) - Initial/Assessment Note    Patient Details  Name: Andrea [name] MRN: [id number] Date of Birth: 02/08/1950  Transition of Care [facility]) CM/SW Contact:    Sherrilyn Rist Phone Number: [phone] 05/08/2018, 3:50 PM  Clinical Narrative:                 Patient is refusing to return to SNF and is requesting [place] services at discharge; pt was active with [facility] but they cannot accept the patient back. [place] [place] choice offered, pt chose [facility]; Juliann Pulse with [facility] called for arrangements;   Expected Discharge Plan: [place] Barriers to Discharge: No Barriers Identified   Patient Goals and CMS Choice Patient states their goals for this hospitalization and ongoing recovery are:: to get stronger CMS Medicare.gov Compare Post Acute Care list provided to:: Patient Choice offered to / list presented to : Patient  Expected Discharge Plan and Services Expected Discharge Plan: [place] In-house Referral: NA Discharge Planning Services: CM Consult Post Acute Care Choice: [place] arrangements for the past 2 months: Single Family Home                 DME Arranged: N/A   HH Arranged: RN, Disease Management, OT, PT, Nurse's Aide, Social Work [employer] Agency: [facility]  Prior Living Arrangements/Services Living arrangements for the past 2 months: [place] Lives with:: Self   Do you feel safe going back to the place where you live?: Yes      Need for Family Participation in Patient Care: Yes (Comment)(son supports pt as much as possible)     Criminal Activity/Legal Involvement Pertinent to Current Situation/Hospitalization: No - Comment as needed  Activities of Daily Living Home Assistive Devices/Equipment: Environmental consultant (specify type) ADL Screening (condition at time of admission) Patient's cognitive ability adequate to safely complete daily activities?: Yes Is the patient deaf or  have difficulty hearing?: No Does the patient have difficulty seeing, even when wearing glasses/contacts?: No Does the patient have difficulty concentrating, remembering, or making decisions?: No Patient able to express need for assistance with ADLs?: Yes Does the patient have difficulty dressing or bathing?: Yes Independently performs ADLs?: No Does the patient have difficulty walking or climbing stairs?: Yes Weakness of Legs: Both Weakness of Arms/Hands: Both  Permission Sought/Granted Permission sought to share information with : Case Manager Permission granted to share information with : Yes, Verbal Permission Granted  Share Information with NAME: son [name] and [name]  Permission granted to share info w AGENCY: [place] agency  Permission granted to share info w Relationship: son     Emotional Assessment Appearance:: Appears older than stated age Attitude/Demeanor/Rapport: Gracious, Engaged Affect (typically observed): Accepting Orientation: : Oriented to Self, Oriented to  Time, Oriented to Place, Oriented to Situation Alcohol / Substance Use: Not Applicable Psych Involvement: No (comment)  Admission diagnosis:  SOB (shortness of breath) [R06.02] Bilateral leg edema [R60.0] Acute on chronic diastolic congestive heart failure (HCC) [I50.33] Chronic renal failure, unspecified CKD stage [N18.9] Patient Active Problem List   Diagnosis Date Noted  . Acute on chronic diastolic congestive heart failure ([place]) 05/07/2018  . CKD (chronic kidney disease), stage III ([place]) 05/07/2018  . Acute on chronic diastolic CHF (congestive heart failure) ([place]) 04/11/2018  . RSV (respiratory syncytial virus pneumonia) 04/11/2018  . DM type 2 (diabetes mellitus, type 2) ([place]) 04/11/2018  . CKD (chronic kidney disease), stage IV ([place]) 04/11/2018  . Acute hypoxemic respiratory  failure ([place]) 04/11/2018  . Encephalopathy 03/18/2018  . Hyperkalemia 03/15/2018  . 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 Chronic systolic heart failure ([place]) 02/12/2018  . Acute respiratory failure with hypoxia ([place]) 01/20/2018  . Constipation 01/20/2018  . High risk medication use 01/19/2018  . Chest pain, rule out acute myocardial infarction 01/17/2018  . Drug allergy, multiple 01/08/2018  . History of breast cancer 01/08/2018  . Ischemic chest pain ([place]) 01/04/2018  . Chest pain 01/04/2018  . CAD S/P percutaneous coronary angioplasty 12/30/2017  . Unspecified abdominal pain 06/02/2017  . C. difficile colitis 03/17/2017  . AKI (acute kidney injury) ([place])   . Diverticulitis 02/17/2017  . Frequent falls 01/08/2017  . Rhabdomyolysis 01/08/2017  . Intractable nausea and vomiting 11/23/2016  . Insulin dependent diabetes mellitus ([place])   . Multiple rib fractures 11/28/2013  . Diarrhea 10/05/2013  . Dyslipidemia 09/16/2013  . History of seizures 08/04/2013  . Chronic pain 07/01/2013  . Anemia 03/16/2013  . Inadequate pain control 03/15/2013  . Cancer of left breast ([place]) 01/20/2013  . Migraine headache 11/10/2012  . 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 Diabetic peripheral neuropathy associated with type 2 diabetes mellitus ([place]) 04/17/2012  . GAD (generalized anxiety disorder) 05/25/2010  . Persistent insomnia 05/25/2010  . Disorder of kidney and ureter 01/26/2009  . Type 2 diabetes mellitus with hyperglycemia, with long-term current use of insulin ([place]) 11/03/2008  . Essential hypertension 09/21/2008  . Esophageal reflux 08/05/2008  . Seizure disorder ([place]) 04/12/2008  . Hypothyroidism 02/27/2007   PCP:  Medicine, [place]:   [id number] [place], [address] [place] [address] Phone: [phone] Fax: [phone]     Social Determinants of Health (SDOH) Interventions    Readmission Risk  Interventions Readmission Risk Prevention Plan 04/12/2018  Transportation Screening Complete  Medication Review Press photographer) Complete  PCP or Specialist appointment within 3-5 days of discharge Complete  HRI or Home Care Consult Complete  SW Recovery Care/Counseling Consult Complete  Palliative Care Screening Not [place] Complete  Some recent data might be hidden

## 2018-05-08 NOTE — Progress Notes (Addendum)
PROGRESS NOTE  Andrea [name] RKY:[id number] DOB: Jun 12, 1950 DOA: 05/07/2018 PCP: [facility], [facility]  HPI/Recap of past 24 hours:  She is on room air at rest., no fever She has significant bilateral lower extremity edema   Assessment/Plan: Principal Problem:   Acute on chronic diastolic congestive heart failure ([place]) Active Problems:   Essential hypertension   Insulin dependent diabetes mellitus (HCC)   Hypothyroidism   History of seizures   Chronic pain   CAD S/P percutaneous coronary angioplasty   Anemia   GAD (generalized anxiety disorder)   Hyperkalemia   CKD (chronic kidney disease), stage III (HCC)   Acute on chronic diastolic CHF: Patient with significant volume overload on admission with bilateral lower extremity 3+ pitting edema up to thigh, will get venous doppler to r/o DVT -she has slightly increased right pleural effusion from prior although BNP is improved from last admission. -she is started on IV Lasix 40 mg twice daily on admission, -Continue home carvedilol -Strict I/O's, daily weights  Bilateral lung infiltrate, viral pneumonia vs from chf RSV + on this admission, interestingly she was tested + for RSV on 3/21 during last hospitalization as well, she might have ongoing RSV shedding for the last three weeks COVID19 testing on 4/19 is negative procalcitonin 0.11 She is afebrile, no leukocytosis, no lymphopenia Mucinex, supportive care, continue diuresis  CAD s/p PCI of proximal and distal LAD w/ DES x 2 (12/30/2017): Chronic and stable without typical chest pain.   Patient not on aspirin due to prior allergy.  She was previously on Brilinta after DES placement however this was switched to Plavix on recent admission.  It was recommended she continue Plavix due to risk for stent thrombosis without antiplatelet therapy  Insulin-dependent diabetes: A1c 6.4 on 03/15/2018. -Continue home Lantus plus sensitive SSI  CKDIV Creatinine  and GFR relatively stable compared to recent baseline however slowly worsening over the last 6 months.  Likely multifactorial from diabetes, CHF, and hypertension. -Continue to monitor with diuresis -renal dosing meds, avoid renal toxin  Hyperkalemia: K 5.6 on admission.  No acute EKG changes. -resolved with Lasix diuresis,  Acute on chronic anemia hgb 7.8 on presentation, no sign of blood loss, possible component of hemodilution in the setting of significant volume overload History of colonoscopy 5 years ago which was normal per patient report, no history of melena or hematochezia She received one prbc on admission, hgb 9.6 this am  Hypothyroidism: TSH 1.937 on 03/18/2018. -Continue Synthroid  Seizure disorder: Currently stable. -Continue Depakote  Generalized anxiety disorder: Chronic and stable.  She is prescribed lorazepam as an outpatient.  We will continue for now at reduced frequency as it is likely contributing to her generalized weakness/deconditioning. -Continue BuSpar -Continue Ativan 0.5 mg, reduce frequency to q8h prn  Chronic back pain/arthritis with chronic left knee pain: Has been taking long-acting morphine and short-acting narcotics.  Will continue both for now with hold parameters, may be contributing to her generalized weakness/deconditioning. -Continue morphine 15 mg p.o. 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, she reports started to use a walker to get around for a year She  refused to go back to SNF, her goal is to get better and go home with home health  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   Code Status: full  Family Communication: patient   Disposition Plan: refused SNF, home with home health once medically stable   Consultants:  none  Procedures:  none  Antibiotics:  Rocephin/doxycycline    Objective: BP (!) 152/70 (BP Location: Right Arm)   Pulse 85   Temp (!) 97.4 F (36.3 C) (Oral)   Resp 20   Ht 5\' 4"  (1.626 m)   Wt 80.4 kg   SpO2 97%   BMI 30.42 kg/m   Intake/Output Summary (Last 24 hours) at 05/08/2018 2216 Last data filed at 05/08/2018 2055 Gross per 24 hour  Intake 1013.28 ml  Output 800 ml  Net 213.28 ml   Filed Weights   05/08/18 0347  Weight: 80.4 kg    Exam: Patient is examined daily including today on 05/08/2018, exams remain the same as of yesterday except that has changed    General:  Chronically ill, pale, but NAD  Cardiovascular: RRR  Respiratory: diminished at basis, no wheezing  Abdomen: Soft/ND/NT, positive BS  Musculoskeletal: #+ bilateral lower extremity Edema up to thigh  Neuro: alert, oriented   Data Reviewed: Basic Metabolic Panel: Recent Labs  Lab 05/07/18 1432 05/07/18 1630 05/08/18 1002  NA 142  --  143  K 5.6* 5.7* 5.0  CL 103  --  103  CO2 30  --  28  GLUCOSE 190*  --  168*  BUN 120*  --  113*  CREATININE 2.37*  --  2.07*  CALCIUM 8.7*  --  8.6*   Liver Function Tests: Recent Labs  Lab 05/07/18 1432  AST 47*  ALT 30  ALKPHOS 51  BILITOT 0.9  PROT 5.7*  ALBUMIN 2.8*   Recent Labs  Lab 05/07/18 1432  LIPASE 32   No results for input(s): AMMONIA in the last 168 hours. CBC: Recent Labs  Lab 05/07/18 1432 05/08/18 1002  WBC 6.9 8.3  NEUTROABS 5.3  --   HGB 7.8* 9.6*  HCT 26.1* 30.2*  MCV 94.6 92.9  PLT 118* 129*   Cardiac Enzymes:   Recent Labs  Lab 05/07/18 1630  TROPONINI 0.04*   BNP (last 3 results) Recent Labs    04/17/18 0202 04/22/18  0520 05/07/18 1555  BNP 3,208.7* 2,365.5* 1,510.5*    ProBNP (last 3 results) No results for input(s): PROBNP in the last 8760 hours.  CBG: Recent Labs  Lab 05/07/18 2154 05/08/18 0554 05/08/18 1125 05/08/18 1632 05/08/18 2114  GLUCAP 143* 206* 152* 139* 153*    Recent Results (from the past 240 hour(s))  SARS Coronavirus 2 [facility] order, Performed in [place] hospital lab)     Status: None   Collection Time: 05/07/18  3:58 PM  Result Value Ref Range Status   SARS Coronavirus 2 NEGATIVE NEGATIVE Final    Comment: (NOTE) If result is NEGATIVE SARS-CoV-2 target nucleic acids are NOT DETECTED. The SARS-CoV-2 RNA is generally detectable in upper and lower  respiratory specimens during the acute phase of infection. The lowest  concentration of SARS-CoV-2 viral copies this assay can detect is 250  copies / mL. A negative result does not preclude SARS-CoV-2 infection  and should not be used as the sole basis for treatment or  other  patient management decisions.  A negative result may occur with  improper specimen collection / handling, submission of specimen other  than nasopharyngeal swab, presence of viral mutation(s) within the  areas targeted by this assay, and inadequate number of viral copies  (<250 copies / mL). A negative result must be combined with clinical  observations, patient history, and epidemiological information. If result is POSITIVE SARS-CoV-2 target nucleic acids are DETECTED. The SARS-CoV-2 RNA is generally detectable in upper and lower  respiratory specimens dur ing the acute phase of infection.  Positive  results are indicative of active infection with SARS-CoV-2.  Clinical  correlation with patient history and other diagnostic information is  necessary to determine patient infection status.  Positive results do  not rule out bacterial infection or co-infection with other viruses. If result is PRESUMPTIVE POSTIVE SARS-CoV-2 nucleic acids MAY BE  PRESENT.   A presumptive positive result was obtained on the submitted specimen  and confirmed on repeat testing.  While 2019 novel coronavirus  (SARS-CoV-2) nucleic acids may be present in the submitted sample  additional confirmatory testing may be necessary for epidemiological  and / or clinical management purposes  to differentiate between  SARS-CoV-2 and other Sarbecovirus currently known to infect humans.  If clinically indicated additional testing with an alternate test  methodology [phone]) is advised. The SARS-CoV-2 RNA is generally  detectable in upper and lower respiratory sp ecimens during the acute  phase of infection. The expected result is Negative. Fact Sheet for Patients:  StrictlyIdeas.no Fact Sheet for Healthcare Providers: BankingDealers.co.za This test is not yet approved or cleared by the [place] FDA and has been authorized for detection and/or diagnosis of SARS-CoV-2 by FDA under an Emergency Use Authorization (EUA).  This EUA will remain in effect (meaning this test can be used) for the duration of the COVID-19 declaration under Section 564(b)(1) of the Act, 21 U.S.C. section 360bbb-3(b)(1), unless the authorization is terminated or revoked sooner. Performed at [facility], [place] [address], [place], [place] [address]   Respiratory Panel by PCR     Status: Abnormal   Collection Time: 05/07/18  3:58 PM  Result Value Ref Range Status   Adenovirus NOT DETECTED NOT DETECTED Final   Coronavirus 229E NOT DETECTED NOT DETECTED Final    Comment: (NOTE) The Coronavirus on the Respiratory Panel, DOES NOT test for the novel  Coronavirus (2019 nCoV)    Coronavirus HKU1 NOT DETECTED NOT DETECTED Final   Coronavirus NL63 NOT DETECTED NOT DETECTED Final   Coronavirus OC43 NOT DETECTED NOT DETECTED Final   Metapneumovirus NOT DETECTED NOT DETECTED Final   Rhinovirus / Enterovirus NOT DETECTED NOT DETECTED Final    Influenza A NOT DETECTED NOT DETECTED Final   Influenza B NOT DETECTED NOT DETECTED Final   Parainfluenza Virus 1 NOT DETECTED NOT DETECTED Final   Parainfluenza Virus 2 NOT DETECTED NOT DETECTED Final   Parainfluenza Virus 3 NOT DETECTED NOT DETECTED Final   Parainfluenza Virus 4 NOT DETECTED NOT DETECTED Final   Respiratory Syncytial Virus DETECTED (A) NOT DETECTED Final    Comment: CRITICAL RESULT CALLED TO, READ BACK BY AND VERIFIED WITH: A.ROBERTS,RN 0457 05/08/18 G.MCADOO    Bordetella pertussis NOT DETECTED NOT DETECTED Final   Chlamydophila pneumoniae NOT DETECTED NOT DETECTED Final   Mycoplasma pneumoniae NOT DETECTED NOT DETECTED Final    Comment: Performed at [facility], [place] [address] [place], [place] [address]  MRSA PCR Screening  Status: Abnormal   Collection Time: 05/08/18  5:58 AM  Result Value Ref Range Status   MRSA by PCR POSITIVE (A) NEGATIVE Final    Comment:        The GeneXpert MRSA Assay (FDA approved for NASAL specimens only), is one component of a comprehensive MRSA colonization surveillance program. It is not intended to diagnose MRSA infection nor to guide or monitor treatment for MRSA infections. RESULT CALLED TO, READ BACK BY AND VERIFIED WITH: Lorin Mercy RN, AT [phone] 05/08/18 BY Rush Landmark Performed at [facility], [place] [address], [place], [place] [address]      Studies: No results found.  Scheduled Meds: . amoxicillin-clavulanate  1 tablet Oral BID  . arformoterol  15 mcg Nebulization BID  . atorvastatin  80 mg Oral QHS  . budesonide  0.5 mg Nebulization BID  . busPIRone  15 mg Oral BID  . carvedilol  25 mg Oral BID WC  . Chlorhexidine Gluconate Cloth  6 each Topical Q0600  . clopidogrel  75 mg Oral Daily  . divalproex  1,000 mg Oral Daily  . doxycycline  100 mg Oral Q12H  . enoxaparin (LOVENOX) injection  30 mg Subcutaneous Q24H  . furosemide  40 mg Intravenous Q12H  . guaiFENesin  600 mg Oral BID  . hydrALAZINE  25  mg Oral Q8H  . insulin aspart  0-9 Units Subcutaneous TID WC  . insulin glargine  10 Units Subcutaneous QHS  . isosorbide mononitrate  30 mg Oral Daily  . levothyroxine  200 mcg Oral QAC breakfast  . Melatonin  3 mg Oral QHS  . morphine  15 mg Oral BID  . mupirocin ointment  1 application Nasal BID  . pantoprazole  40 mg Oral Daily  . senna-docusate  1 tablet Oral BID  . sodium chloride flush  3 mL Intravenous Q12H    Continuous Infusions: . sodium chloride Stopped (05/08/18 0231)     Time spent: 61mins I have personally reviewed and interpreted on  05/08/2018 daily labs, tele strips, imagings as discussed above under date review session and assessment and plans.  I reviewed all nursing notes, pharmacy notes, consultant notes,  vitals, pertinent old records  I have discussed plan of care as described above with RN , patient on 05/08/2018   Florencia Reasons MD, PhD  [facility] Pager [phone]. If 7PM-7AM, please contact night-coverage at [email or web address], password [facility] 05/08/2018, 10:16 PM  LOS: 1 day

## 2018-05-08 NOTE — Progress Notes (Signed)
Pt medications delivered to pharmacy.

## 2018-05-09 ENCOUNTER — Inpatient Hospital Stay (HOSPITAL_COMMUNITY): Payer: Medicare Other

## 2018-05-09 DIAGNOSIS — R627 Adult failure to thrive: Secondary | ICD-10-CM

## 2018-05-09 DIAGNOSIS — E875 Hyperkalemia: Secondary | ICD-10-CM

## 2018-05-09 DIAGNOSIS — R609 Edema, unspecified: Secondary | ICD-10-CM

## 2018-05-09 LAB — BASIC METABOLIC PANEL
Anion gap: 14 (ref 5–15)
BUN: 110 mg/dL — ABNORMAL HIGH (ref 8–23)
CO2: 28 mmol/L (ref 22–32)
Calcium: 8.6 mg/dL — ABNORMAL LOW (ref 8.9–10.3)
Chloride: 103 mmol/L (ref 98–111)
Creatinine, Ser: 1.86 mg/dL — ABNORMAL HIGH (ref 0.44–1.00)
GFR calc Af Amer: 32 mL/min — ABNORMAL LOW (ref >60)
GFR calc non Af Amer: 28 mL/min — ABNORMAL LOW (ref >60)
Glucose, Bld: 94 mg/dL (ref 70–99)
Potassium: 5.3 mmol/L — ABNORMAL HIGH (ref 3.5–5.1)
Sodium: 145 mmol/L (ref 135–145)

## 2018-05-09 LAB — CBC WITH DIFFERENTIAL/PLATELET
Abs Immature Granulocytes: 0.05 10*3/uL (ref 0.00–0.07)
Basophils Absolute: 0 10*3/uL (ref 0.0–0.1)
Basophils Relative: 0 %
Eosinophils Absolute: 0.1 10*3/uL (ref 0.0–0.5)
Eosinophils Relative: 1 %
HCT: 26.3 % — ABNORMAL LOW (ref 36.0–46.0)
Hemoglobin: 8.6 g/dL — ABNORMAL LOW (ref 12.0–15.0)
Immature Granulocytes: 1 %
Lymphocytes Relative: 25 %
Lymphs Abs: 1.7 10*3/uL (ref 0.7–4.0)
MCH: 30.4 pg (ref 26.0–34.0)
MCHC: 32.7 g/dL (ref 30.0–36.0)
MCV: 92.9 fL (ref 80.0–100.0)
Monocytes Absolute: 0.5 10*3/uL (ref 0.1–1.0)
Monocytes Relative: 7 %
Neutro Abs: 4.5 10*3/uL (ref 1.7–7.7)
Neutrophils Relative %: 66 %
Platelets: 109 10*3/uL — ABNORMAL LOW (ref 150–400)
RBC: 2.83 MIL/uL — ABNORMAL LOW (ref 3.87–5.11)
RDW: 14.9 % (ref 11.5–15.5)
WBC: 6.9 10*3/uL (ref 4.0–10.5)
nRBC: 0 % (ref 0.0–0.2)

## 2018-05-09 LAB — TYPE AND SCREEN
ABO/RH(D): B POS
Antibody Screen: NEGATIVE
Unit division: 0

## 2018-05-09 LAB — GLUCOSE, CAPILLARY
Glucose-Capillary: 87 mg/dL (ref 70–99)
Glucose-Capillary: 100 mg/dL — ABNORMAL HIGH (ref 70–99)
Glucose-Capillary: 169 mg/dL — ABNORMAL HIGH (ref 70–99)
Glucose-Capillary: 168 mg/dL — ABNORMAL HIGH (ref 70–99)

## 2018-05-09 LAB — BPAM RBC
Blood Product Expiration Date: 202005022359
ISSUE DATE / TIME: 202004170321
Unit Type and Rh: 7300

## 2018-05-09 LAB — EXPECTORATED SPUTUM ASSESSMENT W GRAM STAIN, RFLX TO RESP C

## 2018-05-09 MED ORDER — INSULIN GLARGINE 100 UNIT/ML ~~LOC~~ SOLN
8.0000 [IU] | Freq: Every day | SUBCUTANEOUS | Status: DC
  Administered 2018-05-09 – 2018-05-17 (×8): 8 [IU] via SUBCUTANEOUS
  Filled 2018-05-09 (×11): qty 0.08

## 2018-05-09 MED ORDER — POLYETHYLENE GLYCOL 3350 17 G PO PACK
17.0000 g | PACK | Freq: Every day | ORAL | Status: DC
  Administered 2018-05-09 – 2018-05-21 (×11): 17 g via ORAL
  Filled 2018-05-09 (×11): qty 1

## 2018-05-09 MED ORDER — IPRATROPIUM-ALBUTEROL 0.5-2.5 (3) MG/3ML IN SOLN
3.0000 mL | Freq: Two times a day (BID) | RESPIRATORY_TRACT | Status: DC
  Administered 2018-05-09 – 2018-05-17 (×16): 3 mL via RESPIRATORY_TRACT
  Filled 2018-05-09 (×16): qty 3

## 2018-05-09 MED ORDER — FUROSEMIDE 10 MG/ML IJ SOLN
80.0000 mg | Freq: Two times a day (BID) | INTRAMUSCULAR | Status: DC
  Administered 2018-05-09 – 2018-05-10 (×4): 80 mg via INTRAVENOUS
  Filled 2018-05-09 (×4): qty 8

## 2018-05-09 NOTE — Progress Notes (Addendum)
PROGRESS NOTE  Andrea [name] PNP:[id number] DOB: 01-22-1951 DOA: 05/07/2018 PCP: Medicine, [facility]  HPI/Recap of past 24 hours:   400cc urine output documented last 24hrs, not sure is accurate, bp elevated, cr improving, remain significant edematous, venous US pending, will increase lasix dose She is on room air at rest., no fever, has congested cough  She reports her base weight is 147lbs, her weight on presentation is 178lbs  Assessment/Plan: Principal Problem:   Acute on chronic diastolic congestive heart failure (HCC) Active Problems:   Essential hypertension   Insulin dependent diabetes mellitus (HCC)   Hypothyroidism   History of seizures   Chronic pain   CAD S/P percutaneous coronary angioplasty   Seizure (HCC)   Anemia   GAD (generalized anxiety disorder)   Hyperkalemia   CKD (chronic kidney disease), stage III (HCC)   Bilateral leg edema   Acute on chronic diastolic CHF: -Patient with 30lbs weight gain, significant volume overload on admission with bilateral lower extremity 3+ pitting edema up to thigh/flank,  -venous doppler negative for DVT -she has slightly increased right pleural effusion from prior although BNP is improved from last admission. -she is started on IV Lasix 40 mg twice daily on admission, lasix dose increased to 80mg  bid on 4/18, monitor bp and cr level  -Continue home carvedilol/hydraalzine/imdur -Strict I/O's, daily weights  Bilateral lung infiltrate, viral pneumonia vs from chf -RSV + on this admission, interestingly she was tested + for RSV on 3/21 during last hospitalization as well, she might have ongoing RSV shedding for the last three weeks -COVID19 testing on 4/19 is negative -procalcitonin 0.11 -She is afebrile, no leukocytosis, no lymphopenia -she has congested cough, rhonchi on exam, cotinue Mucinex, nebs, supportive care, continue diuresis  CAD s/p PCI of proximal and distal LAD w/ DES x 2 (12/30/2017):  Chronic and stable without typical chest pain.   Patient not on aspirin due to prior allergy.  She was previously on Brilinta after DES placement however this was switched to Plavix on recent admission.  It was recommended she continue Plavix due to risk for stent thrombosis without antiplatelet therapy  Insulin-dependent diabetes: A1c 6.4 on 03/15/2018. -Continue Lantus plus sensitive SSI, adjust prn  AKI on CKDIII Creatinine and GFR relatively stable compared to recent baseline however slowly worsening over the last 6 months.  Likely multifactorial from diabetes, CHF, and hypertension. -cr on presentation was 2.37, cr today is 1.86 -Continue to monitor with diuresis -renal dosing meds, avoid renal toxin  Hyperkalemia: K 5.6 on admission.  No acute EKG changes. -improved  with Lasix diuresis, increase lasix on 4/18  Acute on chronic anemia hgb 7.8 on presentation, no sign of blood loss, possible component of hemodilution in the setting of significant volume overload History of colonoscopy 5 years ago which was normal per patient report, no history of melena or hematochezia She received one prbc on admission,  Anemia workup, FOBT ordered ,pending collection  Hypothyroidism: TSH 1.937 on 03/18/2018. -Continue Synthroid  Seizure disorder: Currently stable. -Continue Depakote  Generalized anxiety disorder: Chronic and stable.  -Continue BuSpar -klonopin 0.25mg  prn, she has not required any since hospitalization, do not plan to prescribe benzo at discharge.  Chronic back pain/arthritis with chronic left knee pain: -Has been taking long-acting morphine and short-acting narcotics.  Will continue both for now with hold parameters, may be contributing to her generalized weakness/deconditioning. -Continue morphine 15 mg p.o. 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, she reports started to use a walker to get around for a year She refused to go back to SNF, her goal is to get better and go home with home health  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   Code Status: full  Family Communication: patient   Disposition Plan: refused SNF, home with home health once medically stable   Consultants:  none  Procedures:  none  Antibiotics:  Rocephin from admission /doxycycline    Objective: BP (!) 164/74 (BP Location: Right Arm)   Pulse 80   Temp 98 F (36.7 C) (Oral)   Resp 18   Ht 5\' 4"  (1.626 m)   Wt 80.8 kg Comment: scale b  SpO2 98%   BMI 30.59 kg/m   Intake/Output Summary (Last 24 hours) at 05/09/2018 0805 Last data filed at 05/09/2018 0700 Gross per 24 hour  Intake 680 ml  Output 400 ml  Net 280 ml   Filed Weights   05/08/18 0347 05/09/18 0535  Weight: 80.4 kg 80.8 kg    Exam: Patient is examined daily including today on 05/09/2018, exams remain the same as of yesterday except that has changed    General:  Chronically ill, pale, but NAD  Cardiovascular: RRR  Respiratory: diminished at basis, no wheezing  Abdomen: Soft/ND/NT, positive BS  Musculoskeletal: 3+ bilateral lower extremity Edema up to thigh, flank  Neuro: alert, oriented   Data Reviewed: Basic Metabolic Panel: Recent Labs  Lab 05/07/18 1432 05/07/18 1630 05/08/18 1002 05/09/18 0602  NA 142  --  143 145  K 5.6* 5.7* 5.0 5.3*  CL 103  --  103 103  CO2 30  --  28 28  GLUCOSE 190*  --  168* 94  BUN 120*  --  113* 110*  CREATININE 2.37*  --  2.07* 1.86*  CALCIUM 8.7*  --  8.6*  8.6*   Liver Function Tests: Recent Labs  Lab 05/07/18 1432  AST 47*  ALT 30  ALKPHOS 51  BILITOT 0.9  PROT 5.7*  ALBUMIN 2.8*   Recent Labs  Lab 05/07/18 1432  LIPASE 32   No results for input(s): AMMONIA in the last 168 hours. CBC: Recent Labs  Lab 05/07/18 1432 05/08/18 1002 05/09/18 0602  WBC 6.9 8.3 6.9  NEUTROABS 5.3  --  4.5  HGB 7.8* 9.6* 8.6*  HCT 26.1* 30.2* 26.3*  MCV 94.6 92.9 92.9  PLT 118* 129* 109*   Cardiac Enzymes:   Recent Labs  Lab 05/07/18 1630  TROPONINI 0.04*   BNP (last 3 results) Recent Labs    04/17/18 0202 04/22/18 0520 05/07/18 1555  BNP 3,208.7* 2,365.5* 1,510.5*    ProBNP (last 3 results) No results for input(s): PROBNP in the last 8760 hours.  CBG: Recent Labs  Lab 05/08/18 0554 05/08/18 1125 05/08/18 1632 05/08/18 2114 05/09/18 0701  GLUCAP 206* 152* 139* 153* 87    Recent Results (from the past 240 hour(s))  SARS Coronavirus 2 [facility] order, Performed in [facility] hospital lab)  Status: None   Collection Time: 05/07/18  3:58 PM  Result Value Ref Range Status   SARS Coronavirus 2 NEGATIVE NEGATIVE Final    Comment: (NOTE) If result is NEGATIVE SARS-CoV-2 target nucleic acids are NOT DETECTED. The SARS-CoV-2 RNA is generally detectable in upper and lower  respiratory specimens during the acute phase of infection. The lowest  concentration of SARS-CoV-2 viral copies this assay can detect is 250  copies / mL. A negative result does not preclude SARS-CoV-2 infection  and should not be used as the sole basis for treatment or other  patient management decisions.  A negative result may occur with  improper specimen collection / handling, submission of specimen other  than nasopharyngeal swab, presence of viral mutation(s) within the  areas targeted by this assay, and inadequate number of viral copies  (<250 copies / mL). A negative result must be combined with clinical  observations, patient history, and  epidemiological information. If result is POSITIVE SARS-CoV-2 target nucleic acids are DETECTED. The SARS-CoV-2 RNA is generally detectable in upper and lower  respiratory specimens dur ing the acute phase of infection.  Positive  results are indicative of active infection with SARS-CoV-2.  Clinical  correlation with patient history and other diagnostic information is  necessary to determine patient infection status.  Positive results do  not rule out bacterial infection or co-infection with other viruses. If result is PRESUMPTIVE POSTIVE SARS-CoV-2 nucleic acids MAY BE PRESENT.   A presumptive positive result was obtained on the submitted specimen  and confirmed on repeat testing.  While 2019 novel coronavirus  (SARS-CoV-2) nucleic acids may be present in the submitted sample  additional confirmatory testing may be necessary for epidemiological  and / or clinical management purposes  to differentiate between  SARS-CoV-2 and other Sarbecovirus currently known to infect humans.  If clinically indicated additional testing with an alternate test  methodology [phone]) is advised. The SARS-CoV-2 RNA is generally  detectable in upper and lower respiratory sp ecimens during the acute  phase of infection. The expected result is Negative. Fact Sheet for Patients:  StrictlyIdeas.no Fact Sheet for Healthcare Providers: BankingDealers.co.za This test is not yet approved or cleared by the [place] FDA and has been authorized for detection and/or diagnosis of SARS-CoV-2 by FDA under an Emergency Use Authorization (EUA).  This EUA will remain in effect (meaning this test can be used) for the duration of the COVID-19 declaration under Section 564(b)(1) of the Act, 21 U.S.C. section 360bbb-3(b)(1), unless the authorization is terminated or revoked sooner. Performed at [facility], [place] [address] [place], [place] [address]   Respiratory  Panel by PCR     Status: Abnormal   Collection Time: 05/07/18  3:58 PM  Result Value Ref Range Status   Adenovirus NOT DETECTED NOT DETECTED Final   Coronavirus 229E NOT DETECTED NOT DETECTED Final    Comment: (NOTE) The Coronavirus on the Respiratory Panel, DOES NOT test for the novel  Coronavirus (2019 nCoV)    Coronavirus HKU1 NOT DETECTED NOT DETECTED Final   Coronavirus NL63 NOT DETECTED NOT DETECTED Final   Coronavirus OC43 NOT DETECTED NOT DETECTED Final   Metapneumovirus NOT DETECTED NOT DETECTED Final   Rhinovirus / Enterovirus NOT DETECTED NOT DETECTED Final   Influenza A NOT DETECTED NOT DETECTED Final   Influenza B NOT DETECTED NOT DETECTED Final   Parainfluenza Virus 1 NOT DETECTED NOT DETECTED Final   Parainfluenza Virus 2 NOT DETECTED NOT DETECTED Final   Parainfluenza  Virus 3 NOT DETECTED NOT DETECTED Final   Parainfluenza Virus 4 NOT DETECTED NOT DETECTED Final   Respiratory Syncytial Virus DETECTED (A) NOT DETECTED Final    Comment: CRITICAL RESULT CALLED TO, READ BACK BY AND VERIFIED WITH: A.ROBERTS,RN 0457 05/08/18 G.MCADOO    Bordetella pertussis NOT DETECTED NOT DETECTED Final   Chlamydophila pneumoniae NOT DETECTED NOT DETECTED Final   Mycoplasma pneumoniae NOT DETECTED NOT DETECTED Final    Comment: Performed at [facility], [place] [address], [place], [place] [address]  MRSA PCR Screening     Status: Abnormal   Collection Time: 05/08/18  5:58 AM  Result Value Ref Range Status   MRSA by PCR POSITIVE (A) NEGATIVE Final    Comment:        The GeneXpert MRSA Assay (FDA approved for NASAL specimens only), is one component of a comprehensive MRSA colonization surveillance program. It is not intended to diagnose MRSA infection nor to guide or monitor treatment for MRSA infections. RESULT CALLED TO, READ BACK BY AND VERIFIED WITH: Lorin Mercy RN, AT [phone] 05/08/18 BY Rush Landmark Performed at [facility], [place] [address] [place], [place] [address]    Expectorated sputum assessment w rflx to resp cult     Status: None   Collection Time: 05/09/18  5:34 AM  Result Value Ref Range Status   Specimen Description SPUTUM  Final   Special Requests NONE  Final   Sputum evaluation   Final    Sputum specimen not acceptable for testing.  Please recollect.   RESULT CALLED TO, READ BACK BY AND VERIFIED WITH: RN Madilyn Fireman [phone] FCP Performed at [place] [address], [place], [place] [address]    Report Status 05/09/2018 FINAL  Final     Studies: No results found.  Scheduled Meds: . amoxicillin-clavulanate  1 tablet Oral BID  . arformoterol  15 mcg Nebulization BID  . atorvastatin  80 mg Oral QHS  . budesonide  0.5 mg Nebulization BID  . busPIRone  15 mg Oral BID  . carvedilol  25 mg Oral BID WC  . Chlorhexidine Gluconate Cloth  6 each Topical Q0600  . clopidogrel  75 mg Oral Daily  . divalproex  1,000 mg Oral Daily  . doxycycline  100 mg Oral Q12H  . enoxaparin (LOVENOX) injection  30 mg Subcutaneous Q24H  . furosemide  80 mg Intravenous Q12H  . guaiFENesin  600 mg Oral BID  . hydrALAZINE  25 mg Oral Q8H  . insulin aspart  0-9 Units Subcutaneous TID WC  . insulin glargine  10 Units Subcutaneous QHS  . isosorbide mononitrate  30 mg Oral Daily  . levothyroxine  200 mcg Oral QAC breakfast  . Melatonin  3 mg Oral QHS  . morphine  15 mg Oral BID  . mupirocin ointment  1 application Nasal BID  . pantoprazole  40 mg Oral Daily  . senna-docusate  1 tablet Oral BID  . sodium chloride flush  3 mL Intravenous Q12H    Continuous Infusions: . sodium chloride Stopped (05/08/18 0231)     Time spent: 37mins I have personally reviewed and interpreted on  05/09/2018 daily labs, tele strips, imagings as discussed above under date review session and assessment and plans.  I reviewed all nursing notes, pharmacy notes, consultant notes,  vitals, pertinent old records  I have discussed plan of care as described above with RN  , patient on 05/09/2018   Florencia Reasons MD, PhD  [facility] Pager [phone]. If 7PM-7AM, please contact night-coverage at [email or web address], password [email or web address] 05/09/2018, 8:05 AM  LOS: 2 days

## 2018-05-09 NOTE — Progress Notes (Signed)
VASCULAR LAB PRELIMINARY  PRELIMINARY  PRELIMINARY  PRELIMINARY  Bilateral lower extremity venous duplex completed.    Preliminary report:  See CV proc for preliminary results.  Christne Platts, RVT 05/09/2018, 12:45 PM

## 2018-05-09 NOTE — Plan of Care (Signed)
  Problem: Education: Goal: Knowledge of General Education information will improve Description Including pain rating scale, medication(s)/side effects and non-pharmacologic comfort measures Outcome: Progressing Note:  POC reviewed with pt.   

## 2018-05-09 NOTE — Progress Notes (Signed)
No sputum obtained as ordered. No sputum expectorated.

## 2018-05-09 NOTE — Progress Notes (Signed)
This Education officer, museum met with patient at bedside to discuss discharge plan. Patient continues to decline SNF and would like to discharge home with [facility].once stable.  Patient states that she has been in conversation with her son Reginal Lutes [phone] and is a developing a plan for in home assistance.  Patient states that her son is not working at this time and would have availability to check in on her twice per day. Patient states that she is also willing to consider residing with him temporarily until she becomes stronger if he agrees. Patient verbalized continued plan to get stronger in order to discharge home with [facility], declining   SNF at this time.   [place], [place] [facility] [phone]

## 2018-05-10 ENCOUNTER — Inpatient Hospital Stay (HOSPITAL_COMMUNITY): Payer: Medicare Other

## 2018-05-10 DIAGNOSIS — L8990 Pressure ulcer of unspecified site, unspecified stage: Secondary | ICD-10-CM

## 2018-05-10 DIAGNOSIS — L0231 Cutaneous abscess of buttock: Secondary | ICD-10-CM | POA: Diagnosis present

## 2018-05-10 DIAGNOSIS — L03317 Cellulitis of buttock: Secondary | ICD-10-CM

## 2018-05-10 LAB — CBC WITH DIFFERENTIAL/PLATELET
Abs Immature Granulocytes: 0.02 10*3/uL (ref 0.00–0.07)
Basophils Absolute: 0 10*3/uL (ref 0.0–0.1)
Basophils Relative: 0 %
Eosinophils Absolute: 0.1 10*3/uL (ref 0.0–0.5)
Eosinophils Relative: 1 %
HCT: 27.8 % — ABNORMAL LOW (ref 36.0–46.0)
Hemoglobin: 8.7 g/dL — ABNORMAL LOW (ref 12.0–15.0)
Immature Granulocytes: 0 %
Lymphocytes Relative: 22 %
Lymphs Abs: 1.4 10*3/uL (ref 0.7–4.0)
MCH: 29.6 pg (ref 26.0–34.0)
MCHC: 31.3 g/dL (ref 30.0–36.0)
MCV: 94.6 fL (ref 80.0–100.0)
Monocytes Absolute: 0.5 10*3/uL (ref 0.1–1.0)
Monocytes Relative: 8 %
Neutro Abs: 4.2 10*3/uL (ref 1.7–7.7)
Neutrophils Relative %: 69 %
Platelets: 97 10*3/uL — ABNORMAL LOW (ref 150–400)
RBC: 2.94 MIL/uL — ABNORMAL LOW (ref 3.87–5.11)
RDW: 14.7 % (ref 11.5–15.5)
WBC: 6.2 10*3/uL (ref 4.0–10.5)
nRBC: 0 % (ref 0.0–0.2)

## 2018-05-10 LAB — IRON AND TIBC
Iron: 63 ug/dL (ref 28–170)
Saturation Ratios: 34 % — ABNORMAL HIGH (ref 10.4–31.8)
TIBC: 186 ug/dL — ABNORMAL LOW (ref 250–450)
UIBC: 123 ug/dL

## 2018-05-10 LAB — RETICULOCYTES
Immature Retic Fract: 2.6 % (ref 2.3–15.9)
RBC.: 2.94 MIL/uL — ABNORMAL LOW (ref 3.87–5.11)
Retic Count, Absolute: 11.8 10*3/uL — ABNORMAL LOW (ref 19.0–186.0)
Retic Ct Pct: 0.4 % (ref 0.4–3.1)

## 2018-05-10 LAB — GLUCOSE, CAPILLARY
Glucose-Capillary: 146 mg/dL — ABNORMAL HIGH (ref 70–99)
Glucose-Capillary: 142 mg/dL — ABNORMAL HIGH (ref 70–99)
Glucose-Capillary: 204 mg/dL — ABNORMAL HIGH (ref 70–99)
Glucose-Capillary: 186 mg/dL — ABNORMAL HIGH (ref 70–99)

## 2018-05-10 LAB — BASIC METABOLIC PANEL
Anion gap: 11 (ref 5–15)
BUN: 98 mg/dL — ABNORMAL HIGH (ref 8–23)
CO2: 30 mmol/L (ref 22–32)
Calcium: 8.3 mg/dL — ABNORMAL LOW (ref 8.9–10.3)
Chloride: 102 mmol/L (ref 98–111)
Creatinine, Ser: 1.52 mg/dL — ABNORMAL HIGH (ref 0.44–1.00)
GFR calc Af Amer: 41 mL/min — ABNORMAL LOW (ref >60)
GFR calc non Af Amer: 35 mL/min — ABNORMAL LOW (ref >60)
Glucose, Bld: 187 mg/dL — ABNORMAL HIGH (ref 70–99)
Potassium: 5.1 mmol/L (ref 3.5–5.1)
Sodium: 143 mmol/L (ref 135–145)

## 2018-05-10 LAB — FOLATE: Folate: 5 ng/mL — ABNORMAL LOW (ref >5.9)

## 2018-05-10 LAB — VITAMIN B12: Vitamin B-12: 691 pg/mL (ref 180–914)

## 2018-05-10 MED ORDER — BARRIER CREAM NON-SPECIFIED
1.0000 "application " | TOPICAL_CREAM | Freq: Two times a day (BID) | TOPICAL | Status: DC | PRN
  Administered 2018-05-11 – 2018-05-12 (×2): 1 via TOPICAL
  Filled 2018-05-10: qty 1

## 2018-05-10 MED ORDER — PROMETHAZINE HCL 25 MG PO TABS
12.5000 mg | ORAL_TABLET | Freq: Four times a day (QID) | ORAL | Status: DC | PRN
  Administered 2018-05-10 – 2018-05-15 (×8): 12.5 mg via ORAL
  Filled 2018-05-10 (×10): qty 1

## 2018-05-10 MED ORDER — FOLIC ACID 1 MG PO TABS
1.0000 mg | ORAL_TABLET | Freq: Every day | ORAL | Status: DC
  Administered 2018-05-10 – 2018-05-21 (×12): 1 mg via ORAL
  Filled 2018-05-10 (×12): qty 1

## 2018-05-10 NOTE — Progress Notes (Signed)
Patient has  2 large abcesses on left buttock. Patient says they are painful to touch. MD made aware via text page.

## 2018-05-10 NOTE — Plan of Care (Signed)
  Problem: Education: Goal: Knowledge of General Education information will improve Description Including pain rating scale, medication(s)/side effects and non-pharmacologic comfort measures Outcome: Progressing   Problem: Clinical Measurements: Goal: Ability to maintain clinical measurements within normal limits will improve Outcome: Progressing Goal: Will remain free from infection Outcome: Progressing   Problem: Activity: Goal: Risk for activity intolerance will decrease Outcome: Progressing   Problem: Pain Managment: Goal: General experience of comfort will improve Outcome: Progressing   Problem: Safety: Goal: Ability to remain free from injury will improve Outcome: Progressing   Problem: Skin Integrity: Goal: Risk for impaired skin integrity will decrease Outcome: Progressing   Problem: Education: Goal: Ability to demonstrate management of disease process will improve Outcome: Progressing   Problem: Activity: Goal: Capacity to carry out activities will improve Outcome: Progressing   Problem: Education: Goal: Knowledge of General Education information will improve Description Including pain rating scale, medication(s)/side effects and non-pharmacologic comfort measures Outcome: Progressing   Problem: Clinical Measurements: Goal: Ability to maintain clinical measurements within normal limits will improve Outcome: Progressing Goal: Will remain free from infection Outcome: Progressing   Problem: Activity: Goal: Risk for activity intolerance will decrease Outcome: Progressing   Problem: Pain Managment: Goal: General experience of comfort will improve Outcome: Progressing    Problem: Skin Integrity: Goal: Risk for impaired skin integrity will decrease Outcome: Progressing   Problem: Education: Goal: Ability to demonstrate management of disease process will improve Outcome: Progressing   Problem: Activity: Goal: Capacity to carry out activities will  improve Outcome: Progressing

## 2018-05-10 NOTE — Progress Notes (Signed)
PROGRESS NOTE    Andrea [name]  DPO:[id number] DOB: [birth year] DOA: 05/07/2018 PCP: Medicine, [facility]   Brief Narrative:  68 year old female with a history of chronic diastolic heart failure with preserved EF, coronary artery disease with stents, insulin-dependent diabetes, hypertension, hypothyroidism, stage III/IV kidney disease, seizure disorder, chronic pain who is here with CHF exacerbation and question viral pneumonia.  Assessment & Plan:   Principal Problem:   Acute on chronic diastolic congestive heart failure (HCC) Active Problems:   Essential hypertension   Insulin dependent diabetes mellitus (HCC)   Hypothyroidism   History of seizures   Chronic pain   CAD S/P percutaneous coronary angioplasty   Seizure (HCC)   Anemia   GAD (generalized anxiety disorder)   Hyperkalemia   CKD (chronic kidney disease), stage III (HCC)   Bilateral leg edema   FTT (failure to thrive) in adult   Pressure injury of skin   1. Acute on chronic diastolic heart failure 1. Has 30 pound weight gain over the course of 3 weeks 2. 3+ pitting edema to thighs 3. Dopplers negative for DVT 4. Patient weight is actually increased from yesterday although is -770mL 5. Continue Lasix 80 mg twice daily 6. Continue daily BMP 2. Bilateral lung infiltrates 1. Negative COVID19 lasting 2. RSV positive on RSP, although tested positive for RSV on 3/21 - ?  Ongoing RSV shedding 3. Needs to be afebrile.  No leukocytosis.  No lymphopenia 4. Continues to have dry congested cough with rhonchi. 5. Continue Mucinex, supportive care, continue diuresis 3. Coronary artery disease 1. Stable 2. Continue Plavix 4. Insulin-dependent diabetes 1. Continue Lantus with SSI 2. Continue CBGs 5. Acute on chronic kidney disease 1. Pain improved to 1.52. 2. Continue to monitor 6. Acute on chronic anemia 1. Folic acid deficit 2. Start folic acid supplementation 7. Hypothyroidism 1. Stable 2.  New Synthroid 8. Seizure disorder 1. Stable 2. To Depakote 9. Anxiety disorder 1. Stable 2. Continue BuSpar 10. Chronic pain 1. Continue long-acting and short acting narcotics.  Has hold parameters 11. History of breast cancer 12. Failure to thrive 1. Patient declines returning to skilled nursing facility.   DVT prophylaxis: Continue Lovenox Code Status: Full Family Communication: none Disposition Plan: home following continued improvement. Still needs increased diuresis as weight has continued to increase   Consultants:   none  Subjective: Patient reports increased diuresis from last night.  She continues to feel weak and continues to have a lot of swelling in her lower extremities bilaterally.  Her breathing is okay at rest but has increased shortness of breath when she is lying down flat and with activity.  Objective: Vitals:   05/09/18 1925 05/09/18 2115 05/09/18 2200 05/10/18 0531  BP: (!) 156/63  (!) 165/68 (!) 159/70  Pulse: 81 85  81  Resp: 16 18  12   Temp: 97.7 F (36.5 C)   97.9 F (36.6 C)  TempSrc: Oral   Oral  SpO2: 99% 98%  98%  Weight:    81.7 kg  Height:        Intake/Output Summary (Last 24 hours) at 05/10/2018 0748 Last data filed at 05/10/2018 0540 Gross per 24 hour  Intake 1180 ml  Output 1500 ml  Net -320 ml   Filed Weights   05/08/18 0347 05/09/18 0535 05/10/18 0531  Weight: 80.4 kg 80.8 kg 81.7 kg    Examination:  General exam: Chronically ill, pale.  No acute distress. Respiratory system: Diminished in bases.  Rales.  No  wheezing Cardiovascular system: S1 & S2 heard, RRR. No JVD, murmurs, rubs, gallops or clicks.  3+ edema to thigh Gastrointestinal system: Abdomen is nondistended, soft and nontender.  Central nervous system: Alert and oriented. No focal neurological deficits. Extremities: No focal deficits.  Patient has multiple wounds on feet bilaterally. Psychiatry: Judgement and insight appear normal. Mood & affect appropriate.      Data Reviewed: I have personally reviewed following labs and imaging studies  CBC: Recent Labs  Lab 05/07/18 1432 05/08/18 1002 05/09/18 0602 05/10/18 0505  WBC 6.9 8.3 6.9 6.2  NEUTROABS 5.3  --  4.5 4.2  HGB 7.8* 9.6* 8.6* 8.7*  HCT 26.1* 30.2* 26.3* 27.8*  MCV 94.6 92.9 92.9 94.6  PLT 118* 129* 109* 97*   Basic Metabolic Panel: Recent Labs  Lab 05/07/18 1432 05/07/18 1630 05/08/18 1002 05/09/18 0602 05/10/18 0505  NA 142  --  143 145 143  K 5.6* 5.7* 5.0 5.3* 5.1  CL 103  --  103 103 102  CO2 30  --  28 28 30   GLUCOSE 190*  --  168* 94 187*  BUN 120*  --  113* 110* 98*  CREATININE 2.37*  --  2.07* 1.86* 1.52*  CALCIUM 8.7*  --  8.6* 8.6* 8.3*   GFR: Estimated Creatinine Clearance: 37.1 mL/min (A) (by C-G formula based on SCr of 1.52 mg/dL (H)). Liver Function Tests: Recent Labs  Lab 05/07/18 1432  AST 47*  ALT 30  ALKPHOS 51  BILITOT 0.9  PROT 5.7*  ALBUMIN 2.8*   Recent Labs  Lab 05/07/18 1432  LIPASE 32   No results for input(s): AMMONIA in the last 168 hours. Coagulation Profile: No results for input(s): INR, PROTIME in the last 168 hours. Cardiac Enzymes: Recent Labs  Lab 05/07/18 1630  TROPONINI 0.04*   BNP (last 3 results) No results for input(s): PROBNP in the last 8760 hours. HbA1C: No results for input(s): HGBA1C in the last 72 hours. CBG: Recent Labs  Lab 05/09/18 0701 05/09/18 1206 05/09/18 1631 05/09/18 2110 05/10/18 0554  GLUCAP 87 100* 169* 168* 146*   Lipid Profile: No results for input(s): CHOL, HDL, LDLCALC, TRIG, CHOLHDL, LDLDIRECT in the last 72 hours. Thyroid Function Tests: No results for input(s): TSH, T4TOTAL, FREET4, T3FREE, THYROIDAB in the last 72 hours. Anemia Panel: Recent Labs    05/10/18 0505  VITAMINB12 691  FOLATE 5.0*  TIBC 186*  IRON 63  RETICCTPCT 0.4   Sepsis Labs: Recent Labs  Lab 05/08/18 0020  PROCALCITON 0.11    Recent Results (from the past 240 hour(s))  SARS Coronavirus 2  [facility] order, Performed in [place] hospital lab)     Status: None   Collection Time: 05/07/18  3:58 PM  Result Value Ref Range Status   SARS Coronavirus 2 NEGATIVE NEGATIVE Final    Comment: (NOTE) If result is NEGATIVE SARS-CoV-2 target nucleic acids are NOT DETECTED. The SARS-CoV-2 RNA is generally detectable in upper and lower  respiratory specimens during the acute phase of infection. The lowest  concentration of SARS-CoV-2 viral copies this assay can detect is 250  copies / mL. A negative result does not preclude SARS-CoV-2 infection  and should not be used as the sole basis for treatment or other  patient management decisions.  A negative result may occur with  improper specimen collection / handling, submission of specimen other  than nasopharyngeal swab, presence of viral mutation(s) within the  areas targeted by this assay, and inadequate number of  viral copies  (<250 copies / mL). A negative result must be combined with clinical  observations, patient history, and epidemiological information. If result is POSITIVE SARS-CoV-2 target nucleic acids are DETECTED. The SARS-CoV-2 RNA is generally detectable in upper and lower  respiratory specimens dur ing the acute phase of infection.  Positive  results are indicative of active infection with SARS-CoV-2.  Clinical  correlation with patient history and other diagnostic information is  necessary to determine patient infection status.  Positive results do  not rule out bacterial infection or co-infection with other viruses. If result is PRESUMPTIVE POSTIVE SARS-CoV-2 nucleic acids MAY BE PRESENT.   A presumptive positive result was obtained on the submitted specimen  and confirmed on repeat testing.  While 2019 novel coronavirus  (SARS-CoV-2) nucleic acids may be present in the submitted sample  additional confirmatory testing may be necessary for epidemiological  and / or clinical management purposes  to differentiate  between  SARS-CoV-2 and other Sarbecovirus currently known to infect humans.  If clinically indicated additional testing with an alternate test  methodology [phone]) is advised. The SARS-CoV-2 RNA is generally  detectable in upper and lower respiratory sp ecimens during the acute  phase of infection. The expected result is Negative. Fact Sheet for Patients:  StrictlyIdeas.no Fact Sheet for Healthcare Providers: BankingDealers.co.za This test is not yet approved or cleared by the [place] FDA and has been authorized for detection and/or diagnosis of SARS-CoV-2 by FDA under an Emergency Use Authorization (EUA).  This EUA will remain in effect (meaning this test can be used) for the duration of the COVID-19 declaration under Section 564(b)(1) of the Act, 21 U.S.C. section 360bbb-3(b)(1), unless the authorization is terminated or revoked sooner. Performed at [facility], [place] [address] [place], [place] [address]   Respiratory Panel by PCR     Status: Abnormal   Collection Time: 05/07/18  3:58 PM  Result Value Ref Range Status   Adenovirus NOT DETECTED NOT DETECTED Final   Coronavirus 229E NOT DETECTED NOT DETECTED Final    Comment: (NOTE) The Coronavirus on the Respiratory Panel, DOES NOT test for the novel  Coronavirus (2019 nCoV)    Coronavirus HKU1 NOT DETECTED NOT DETECTED Final   Coronavirus NL63 NOT DETECTED NOT DETECTED Final   Coronavirus OC43 NOT DETECTED NOT DETECTED Final   Metapneumovirus NOT DETECTED NOT DETECTED Final   Rhinovirus / Enterovirus NOT DETECTED NOT DETECTED Final   Influenza A NOT DETECTED NOT DETECTED Final   Influenza B NOT DETECTED NOT DETECTED Final   Parainfluenza Virus 1 NOT DETECTED NOT DETECTED Final   Parainfluenza Virus 2 NOT DETECTED NOT DETECTED Final   Parainfluenza Virus 3 NOT DETECTED NOT DETECTED Final   Parainfluenza Virus 4 NOT DETECTED NOT DETECTED Final   Respiratory  Syncytial Virus DETECTED (A) NOT DETECTED Final    Comment: CRITICAL RESULT CALLED TO, READ BACK BY AND VERIFIED WITH: A.ROBERTS,RN 0457 05/08/18 G.MCADOO    Bordetella pertussis NOT DETECTED NOT DETECTED Final   Chlamydophila pneumoniae NOT DETECTED NOT DETECTED Final   Mycoplasma pneumoniae NOT DETECTED NOT DETECTED Final    Comment: Performed at [facility], [place] [address], [place], [place] [address]  MRSA PCR Screening     Status: Abnormal   Collection Time: 05/08/18  5:58 AM  Result Value Ref Range Status   MRSA by PCR POSITIVE (A) NEGATIVE Final    Comment:        The GeneXpert MRSA Assay (FDA approved  for NASAL specimens only), is one component of a comprehensive MRSA colonization surveillance program. It is not intended to diagnose MRSA infection nor to guide or monitor treatment for MRSA infections. RESULT CALLED TO, READ BACK BY AND VERIFIED WITH: Lorin Mercy RN, AT [phone] 05/08/18 BY Rush Landmark Performed at [facility], [place] [address], [place], [place] [address]   Expectorated sputum assessment w rflx to resp cult     Status: None   Collection Time: 05/09/18  5:34 AM  Result Value Ref Range Status   Specimen Description SPUTUM  Final   Special Requests NONE  Final   Sputum evaluation   Final    Sputum specimen not acceptable for testing.  Please recollect.   RESULT CALLED TO, READ BACK BY AND VERIFIED WITH: RN Madilyn Fireman [phone] FCP Performed at [place] [address], [place], [place] [address]    Report Status 05/09/2018 FINAL  Final         Radiology Studies: Vas [place] Lower Extremity Venous (dvt)  Result Date: 05/09/2018  Lower Venous Study Indications: Pitting Edema.  Risk Factors: CHF. Limitations: Pitting edema. Comparison Study: Prior study from 12/26/2017 is available for comparison. Performing Technologist: Sharion Dove RVS  Examination Guidelines: A complete evaluation includes B-mode imaging, spectral Doppler, color  Doppler, and power Doppler as needed of all accessible portions of each vessel. Bilateral testing is considered an integral part of a complete examination. Limited examinations for reoccurring indications may be performed as noted.  +---------+---------------+---------+-----------+----------+-------+ RIGHT    CompressibilityPhasicitySpontaneityPropertiesSummary +---------+---------------+---------+-----------+----------+-------+ CFV      Full           Yes      Yes                          +---------+---------------+---------+-----------+----------+-------+ SFJ      Full                                                 +---------+---------------+---------+-----------+----------+-------+ FV Prox  Full                                                 +---------+---------------+---------+-----------+----------+-------+ FV Mid   Full                                                 +---------+---------------+---------+-----------+----------+-------+ FV DistalFull                                                 +---------+---------------+---------+-----------+----------+-------+ PFV      Full                                                 +---------+---------------+---------+-----------+----------+-------+ POP      Full  Yes      Yes                          +---------+---------------+---------+-----------+----------+-------+ PTV      Full                                                 +---------+---------------+---------+-----------+----------+-------+ PERO     Full                                                 +---------+---------------+---------+-----------+----------+-------+   +---------+---------------+---------+-----------+----------+-------+ LEFT     CompressibilityPhasicitySpontaneityPropertiesSummary +---------+---------------+---------+-----------+----------+-------+ CFV      Full           Yes      Yes                           +---------+---------------+---------+-----------+----------+-------+ SFJ      Full                                                 +---------+---------------+---------+-----------+----------+-------+ FV Prox  Full                                                 +---------+---------------+---------+-----------+----------+-------+ FV Mid   Full                                                 +---------+---------------+---------+-----------+----------+-------+ FV DistalFull                                                 +---------+---------------+---------+-----------+----------+-------+ PFV      Full                                                 +---------+---------------+---------+-----------+----------+-------+ POP      Full           Yes      Yes                          +---------+---------------+---------+-----------+----------+-------+ PTV      Full                                                 +---------+---------------+---------+-----------+----------+-------+ PERO     Full                                                 +---------+---------------+---------+-----------+----------+-------+  Summary: Right: There is no evidence of deep vein thrombosis in the lower extremity. Significant interstitial fluid throughout. Left: There is no evidence of deep vein thrombosis in the lower extremity. significant interstitial fluid throughout  *See table(s) above for measurements and observations. Electronically signed by Servando Snare MD on 05/09/2018 at 4:13:34 PM.    Final         Scheduled Meds: . amoxicillin-clavulanate  1 tablet Oral BID  . arformoterol  15 mcg Nebulization BID  . atorvastatin  80 mg Oral QHS  . budesonide  0.5 mg Nebulization BID  . busPIRone  15 mg Oral BID  . carvedilol  25 mg Oral BID WC  . Chlorhexidine Gluconate Cloth  6 each Topical Q0600  . clopidogrel  75 mg Oral Daily  . divalproex  1,000 mg Oral Daily   . doxycycline  100 mg Oral Q12H  . enoxaparin (LOVENOX) injection  30 mg Subcutaneous Q24H  . folic acid  1 mg Oral Daily  . furosemide  80 mg Intravenous Q12H  . guaiFENesin  600 mg Oral BID  . hydrALAZINE  25 mg Oral Q8H  . insulin aspart  0-9 Units Subcutaneous TID WC  . insulin glargine  8 Units Subcutaneous QHS  . ipratropium-albuterol  3 mL Nebulization BID  . isosorbide mononitrate  30 mg Oral Daily  . levothyroxine  200 mcg Oral QAC breakfast  . Melatonin  3 mg Oral QHS  . morphine  15 mg Oral BID  . mupirocin ointment  1 application Nasal BID  . pantoprazole  40 mg Oral Daily  . polyethylene glycol  17 g Oral Daily  . senna-docusate  1 tablet Oral BID  . sodium chloride flush  3 mL Intravenous Q12H   Continuous Infusions: . sodium chloride Stopped (05/08/18 0231)     LOS: 3 days    Time spent: 32 minutes    Truett Mainland, DO [facility]  05/10/2018, 7:48 AM

## 2018-05-10 NOTE — Progress Notes (Signed)
Patient Name: Andrea [name], female   DOB: [birth year], 68 y.o.  MRN: [id number]  Asked to see patient. Has two indurated areas on left buttock. One is approximately 3cm in diameter with a white cap and the other is approximately 5cm in diameter. I could not appreciate any fluctuant component to the indurated tissue. I discussed pt with Dr Rosendo Gros of General surgery, who recommended [place]. If there is a fluid component, then this can be done bedside by our team, if we are comfortable with this. If not, then reconsult General Surgery. He agreed with continuing doxycycline at this time.   Truett Mainland, DO 05/10/2018 3:46 PM

## 2018-05-11 DIAGNOSIS — L89319 Pressure ulcer of right buttock, unspecified stage: Secondary | ICD-10-CM

## 2018-05-11 LAB — BASIC METABOLIC PANEL
Anion gap: 12 (ref 5–15)
BUN: 84 mg/dL — ABNORMAL HIGH (ref 8–23)
CO2: 30 mmol/L (ref 22–32)
Calcium: 8.8 mg/dL — ABNORMAL LOW (ref 8.9–10.3)
Chloride: 106 mmol/L (ref 98–111)
Creatinine, Ser: 1.44 mg/dL — ABNORMAL HIGH (ref 0.44–1.00)
GFR calc Af Amer: 43 mL/min — ABNORMAL LOW (ref >60)
GFR calc non Af Amer: 37 mL/min — ABNORMAL LOW (ref >60)
Glucose, Bld: 94 mg/dL (ref 70–99)
Potassium: 5.4 mmol/L — ABNORMAL HIGH (ref 3.5–5.1)
Sodium: 148 mmol/L — ABNORMAL HIGH (ref 135–145)

## 2018-05-11 LAB — GLUCOSE, CAPILLARY
Glucose-Capillary: 110 mg/dL — ABNORMAL HIGH (ref 70–99)
Glucose-Capillary: 110 mg/dL — ABNORMAL HIGH (ref 70–99)
Glucose-Capillary: 147 mg/dL — ABNORMAL HIGH (ref 70–99)
Glucose-Capillary: 182 mg/dL — ABNORMAL HIGH (ref 70–99)

## 2018-05-11 LAB — CBC
HCT: 31.4 % — ABNORMAL LOW (ref 36.0–46.0)
Hemoglobin: 9.7 g/dL — ABNORMAL LOW (ref 12.0–15.0)
MCH: 29.1 pg (ref 26.0–34.0)
MCHC: 30.9 g/dL (ref 30.0–36.0)
MCV: 94.3 fL (ref 80.0–100.0)
Platelets: 123 10*3/uL — ABNORMAL LOW (ref 150–400)
RBC: 3.33 MIL/uL — ABNORMAL LOW (ref 3.87–5.11)
RDW: 14.7 % (ref 11.5–15.5)
WBC: 8.3 10*3/uL (ref 4.0–10.5)
nRBC: 0 % (ref 0.0–0.2)

## 2018-05-11 MED ORDER — FUROSEMIDE 10 MG/ML IJ SOLN
80.0000 mg | Freq: Two times a day (BID) | INTRAMUSCULAR | Status: DC
  Administered 2018-05-11 – 2018-05-13 (×6): 80 mg via INTRAVENOUS
  Filled 2018-05-11 (×6): qty 8

## 2018-05-11 NOTE — Progress Notes (Signed)
Physical Therapy Treatment Patient Details Name: Andrea [name] MRN: [id number] DOB: September 19, 1950 Today's Date: 05/11/2018    History of Present Illness 68 yo admitted with SOB and weakness from home after leaving SNF 4/13. Pt with CHF and PNA. Pt D/C from hospital to [facility] on 4/5. PMhx: CHF, CAD, breast CA, bil mastectomy, HTn, seizure, migraine, IDDM, CKD    PT Comments    Pt with improved mobility this session able to perform transfers without assist. Pt with improved gait and no veering or need for cues with walking today. Pt continues to demonstrate cognitive deficits throughout session. Pt educated for floor to bed transfers per her request and again discussed need to have cell phone or life alert with her at all times to maintain self off of floor as well as ambulating when family is present at home to prevent falls.   86% on Ra at rest with return to 2L with SPO2 95% and maintained during gait HR 88    Follow Up Recommendations  Home health PT;Supervision/Assistance - 24 hour(pt refused SNF)     Equipment Recommendations  None recommended by PT    Recommendations for Other Services       Precautions / Restrictions Precautions Precautions: Fall Precaution Comments: watch 02    Mobility  Bed Mobility Overal bed mobility: Modified Independent Bed Mobility: Supine to Sit           General bed mobility comments: pt able to transition from supine to sit with HOB 20 degrees with use of rail x 2 trials  Transfers Overall transfer level: Needs assistance   Transfers: Sit to/from Stand Sit to Stand: Supervision         General transfer comment: supervision for safety and lines for standing from bed, chair and BSC. Pt requested to practice floor to chair transfer. [place] to transition from bed to floor with pt rolling onto stomach to lower to knees then sacrum. Pt able to return to high kneel without assist and get right foot to floor and grasp bed. Required min  assist to fully lift off floor to return to supine in bed.   Ambulation/Gait Ambulation/Gait assistance: Min guard Gait Distance (Feet): 350 Feet Assistive device: Rolling walker (2 wheeled) Gait Pattern/deviations: Step-through pattern;Decreased stride length;Trunk flexed   Gait velocity interpretation: >2.62 ft/sec, indicative of community ambulatory General Gait Details: pt with improved speed with flexed trunk and no cues for positioning in RW this session   Stairs             Wheelchair Mobility    Modified Rankin (Stroke Patients Only)       Balance Overall balance assessment: Needs assistance;History of Falls Sitting-balance support: No upper extremity supported;Feet supported Sitting balance-Leahy Scale: Good     Standing balance support: Bilateral upper extremity supported;During functional activity Standing balance-Leahy Scale: Fair                              Cognition Arousal/Alertness: Awake/alert Behavior During Therapy: WFL for tasks assessed/performed Overall Cognitive Status: No family/caregiver present to determine baseline cognitive functioning Area of Impairment: Safety/judgement;Problem solving;Awareness;Memory                   Current Attention Level: Selective Memory: Decreased short-term memory Following Commands: Follows one step commands consistently Safety/Judgement: Decreased awareness of safety   Problem Solving: Slow processing General Comments: pt remains to have limited problem solving  Exercises      General Comments        Pertinent Vitals/Pain Pain Score: 3  Pain Location: knees and ankles Pain Descriptors / Indicators: Sore Pain Intervention(s): Limited activity within patient's tolerance;Monitored during session;Repositioned    Home Living                      Prior Function            PT Goals (current goals can now be found in the care plan section) Progress towards PT  goals: Progressing toward goals    Frequency           PT Plan Current plan remains appropriate    Co-evaluation              AM-PAC PT "6 Clicks" Mobility   Outcome Measure  Help needed turning from your back to your side while in a flat bed without using bedrails?: None Help needed moving from lying on your back to sitting on the side of a flat bed without using bedrails?: A Little Help needed moving to and from a bed to a chair (including a wheelchair)?: None Help needed standing up from a chair using your arms (e.g., wheelchair or bedside chair)?: None Help needed to walk in hospital room?: A Little Help needed climbing 3-5 steps with a railing? : A Little 6 Click Score: 21    End of Session Equipment Utilized During Treatment: Gait belt;Oxygen Activity Tolerance: Patient tolerated treatment well Patient left: in chair;with call bell/phone within reach;with nursing/sitter in room Nurse Communication: Mobility status PT Visit Diagnosis: Muscle weakness (generalized) (M62.81);Unsteadiness on feet (R26.81);Other abnormalities of gait and mobility (R26.89)     Time: 1322-1401 PT Time Calculation (min) (ACUTE ONLY): 39 min  Charges:  $Gait Training: 8-22 mins $Therapeutic Activity: 23-37 mins                     [place], PT Acute Rehabilitation Services Pager: [phone] Office: [place] 05/11/2018, 2:15 PM

## 2018-05-11 NOTE — Progress Notes (Signed)
PROGRESS NOTE  Andrea [name] UDJ:[id number] DOB: [birth year] DOA: 05/07/2018 PCP: Medicine, [facility]  HPI/Recap of past 24 hours: Patient is a 68 year old female past medical history of diastolic CHF, three-vessel CAD status post PCI, diabetes mellitus and stage III-4 CKD who presented to the emergency room on 4/16 with complaints of shortness of breath as well as weakness, unable to pick herself up off the floor.  She had been just discharged 10 days prior for a multi lobar pneumonia from RSV plus CHF exacerbation.  She had just left her skilled nursing facility myself discharge 3 days before coming in to the back to the emergency room.  COVID ruled out.  Patient found to be in significant volume overload with 30 pound weight gain.  Also complained of right buttock pain where she is found to have an area of induration with fluctuance.  Today, complaints are about the same although she feels like her breathing is getting a little bit better.  Assessment/Plan: Principal Problem:   Acute on chronic diastolic congestive heart failure ([place]) & resolving RSV Pneumonia: Lasix increased as of this morning.  Slowly start and diurese. Active Problems:   Essential hypertension: Blood pressure still elevated, should improve as she better diuresis    Insulin dependent diabetes mellitus ([place]): Continue home meds.  CBG stable   Hypothyroidism: Continue Synthroid   History of seizures: Continue Depakote   Chronic pain: Continue home medications   CAD S/P percutaneous coronary angioplasty    Anemia   GAD (generalized anxiety disorder)   Hyperkalemia Acute kidney injury in the setting of CKD (chronic kidney disease), stage III ([place]): Has been responding to Lasix.  Creatinine down to baseline today   Bilateral leg edema   FTT (failure to thrive) in adult   Pressure injury of skin   Cellulitis and abscess of buttock: Continue doxycycline.  Awaiting ultrasound of buttock.  If  positive for abscess, will either do bedside debridement or have surgery do it   Code Status: Full code  Family Communication: Left message with family  Disposition Plan: Home with home health once fully diuresed.  Refuses skilled nursing.   Consultants:  Discussed with general surgery  Procedures:  Pending ultrasound buttock  Antimicrobials:  Doxycycline since admission  DVT prophylaxis: Lovenox   Objective: Vitals:   05/11/18 1149 05/11/18 1356  BP: (!) 176/82   Pulse: 82   Resp: 19   Temp: 98.5 F (36.9 C)   SpO2: 97% 97%    Intake/Output Summary (Last 24 hours) at 05/11/2018 1641 Last data filed at 05/11/2018 1157 Gross per 24 hour  Intake 727 ml  Output 1701 ml  Net -974 ml   Filed Weights   05/09/18 0535 05/10/18 0531 05/11/18 0023  Weight: 80.8 kg 81.7 kg 81.6 kg   Body mass index is 30.88 kg/m.  Exam:   General: Alert and oriented x3, mild distress from anxiety  HEENT: Normocephalic and atraumatic, mucous membranes are slightly dry  Neck: Supple, no JVD  Cardiovascular: Regular rate and rhythm, S1-S2, 2 out of 6 systolic ejection murmur  Respiratory: Moderate inspiratory effort, decreased bibasilar  Abdomen: Soft, nontender, nondistended, hypoactive bowel sounds  Musculoskeletal: 2-3+ pitting edema bilaterally  Skin: Multiple scabbed over lesions on patient's legs and feet.  Also large 2 to 3 cm scabbed lesion on right buttock  Psychiatry: Anxious, but no evidence of psychoses   Data Reviewed: CBC: Recent Labs  Lab 05/07/18 1432 05/08/18 1002 05/09/18 0602 05/10/18 0505 05/11/18 0802  WBC 6.9 8.3 6.9 6.2 8.3  NEUTROABS 5.3  --  4.5 4.2  --   HGB 7.8* 9.6* 8.6* 8.7* 9.7*  HCT 26.1* 30.2* 26.3* 27.8* 31.4*  MCV 94.6 92.9 92.9 94.6 94.3  PLT 118* 129* 109* 97* 092*   Basic Metabolic Panel: Recent Labs  Lab 05/07/18 1432 05/07/18 1630 05/08/18 1002 05/09/18 0602 05/10/18 0505 05/11/18 0802  NA 142  --  143 145 143  148*  K 5.6* 5.7* 5.0 5.3* 5.1 5.4*  CL 103  --  103 103 102 106  CO2 30  --  28 28 30 30   GLUCOSE 190*  --  168* 94 187* 94  BUN 120*  --  113* 110* 98* 84*  CREATININE 2.37*  --  2.07* 1.86* 1.52* 1.44*  CALCIUM 8.7*  --  8.6* 8.6* 8.3* 8.8*   GFR: Estimated Creatinine Clearance: 39.2 mL/min (A) (by C-G formula based on SCr of 1.44 mg/dL (H)). Liver Function Tests: Recent Labs  Lab 05/07/18 1432  AST 47*  ALT 30  ALKPHOS 51  BILITOT 0.9  PROT 5.7*  ALBUMIN 2.8*   Recent Labs  Lab 05/07/18 1432  LIPASE 32   No results for input(s): AMMONIA in the last 168 hours. Coagulation Profile: No results for input(s): INR, PROTIME in the last 168 hours. Cardiac Enzymes: Recent Labs  Lab 05/07/18 1630  TROPONINI 0.04*   BNP (last 3 results) No results for input(s): PROBNP in the last 8760 hours. HbA1C: No results for input(s): HGBA1C in the last 72 hours. CBG: Recent Labs  Lab 05/10/18 1635 05/10/18 2120 05/11/18 0611 05/11/18 1143 05/11/18 1607  GLUCAP 204* 186* 110* 110* 147*   Lipid Profile: No results for input(s): CHOL, HDL, LDLCALC, TRIG, CHOLHDL, LDLDIRECT in the last 72 hours. Thyroid Function Tests: No results for input(s): TSH, T4TOTAL, FREET4, T3FREE, THYROIDAB in the last 72 hours. Anemia Panel: Recent Labs    05/10/18 0505  VITAMINB12 691  FOLATE 5.0*  TIBC 186*  IRON 63  RETICCTPCT 0.4   Urine analysis:    Component Value Date/Time   COLORURINE YELLOW 02/25/2018 2316   APPEARANCEUR HAZY (A) 02/25/2018 2316   LABSPEC 1.014 02/25/2018 2316   PHURINE 5.0 02/25/2018 2316   GLUCOSEU NEGATIVE 02/25/2018 2316   HGBUR SMALL (A) 02/25/2018 2316   BILIRUBINUR NEGATIVE 02/25/2018 2316   KETONESUR NEGATIVE 02/25/2018 2316   PROTEINUR >=300 (A) 02/25/2018 2316   UROBILINOGEN 1.0 12/02/2013 1612   NITRITE NEGATIVE 02/25/2018 2316   LEUKOCYTESUR SMALL (A) 02/25/2018 2316   Sepsis Labs: @LABRCNTIP (procalcitonin:4,lacticidven:4)  ) Recent Results  (from the past 240 hour(s))  SARS Coronavirus 2 [facility] order, Performed in [place] hospital lab)     Status: None   Collection Time: 05/07/18  3:58 PM  Result Value Ref Range Status   SARS Coronavirus 2 NEGATIVE NEGATIVE Final    Comment: (NOTE) If result is NEGATIVE SARS-CoV-2 target nucleic acids are NOT DETECTED. The SARS-CoV-2 RNA is generally detectable in upper and lower  respiratory specimens during the acute phase of infection. The lowest  concentration of SARS-CoV-2 viral copies this assay can detect is 250  copies / mL. A negative result does not preclude SARS-CoV-2 infection  and should not be used as the sole basis for treatment or other  patient management decisions.  A negative result may occur with  improper specimen collection / handling, submission of specimen other  than nasopharyngeal swab, presence of viral mutation(s) within the  areas targeted by this assay,  and inadequate number of viral copies  (<250 copies / mL). A negative result must be combined with clinical  observations, patient history, and epidemiological information. If result is POSITIVE SARS-CoV-2 target nucleic acids are DETECTED. The SARS-CoV-2 RNA is generally detectable in upper and lower  respiratory specimens dur ing the acute phase of infection.  Positive  results are indicative of active infection with SARS-CoV-2.  Clinical  correlation with patient history and other diagnostic information is  necessary to determine patient infection status.  Positive results do  not rule out bacterial infection or co-infection with other viruses. If result is PRESUMPTIVE POSTIVE SARS-CoV-2 nucleic acids MAY BE PRESENT.   A presumptive positive result was obtained on the submitted specimen  and confirmed on repeat testing.  While 2019 novel coronavirus  (SARS-CoV-2) nucleic acids may be present in the submitted sample  additional confirmatory testing may be necessary for epidemiological  and / or  clinical management purposes  to differentiate between  SARS-CoV-2 and other Sarbecovirus currently known to infect humans.  If clinically indicated additional testing with an alternate test  methodology [phone]) is advised. The SARS-CoV-2 RNA is generally  detectable in upper and lower respiratory sp ecimens during the acute  phase of infection. The expected result is Negative. Fact Sheet for Patients:  StrictlyIdeas.no Fact Sheet for Healthcare Providers: BankingDealers.co.za This test is not yet approved or cleared by the [place] FDA and has been authorized for detection and/or diagnosis of SARS-CoV-2 by FDA under an Emergency Use Authorization (EUA).  This EUA will remain in effect (meaning this test can be used) for the duration of the COVID-19 declaration under Section 564(b)(1) of the Act, 21 U.S.C. section 360bbb-3(b)(1), unless the authorization is terminated or revoked sooner. Performed at [facility], [place] [address] [place], [place] [address]   Respiratory Panel by PCR     Status: Abnormal   Collection Time: 05/07/18  3:58 PM  Result Value Ref Range Status   Adenovirus NOT DETECTED NOT DETECTED Final   Coronavirus 229E NOT DETECTED NOT DETECTED Final    Comment: (NOTE) The Coronavirus on the Respiratory Panel, DOES NOT test for the novel  Coronavirus (2019 nCoV)    Coronavirus HKU1 NOT DETECTED NOT DETECTED Final   Coronavirus NL63 NOT DETECTED NOT DETECTED Final   Coronavirus OC43 NOT DETECTED NOT DETECTED Final   Metapneumovirus NOT DETECTED NOT DETECTED Final   Rhinovirus / Enterovirus NOT DETECTED NOT DETECTED Final   Influenza A NOT DETECTED NOT DETECTED Final   Influenza B NOT DETECTED NOT DETECTED Final   Parainfluenza Virus 1 NOT DETECTED NOT DETECTED Final   Parainfluenza Virus 2 NOT DETECTED NOT DETECTED Final   Parainfluenza Virus 3 NOT DETECTED NOT DETECTED Final   Parainfluenza Virus 4 NOT  DETECTED NOT DETECTED Final   Respiratory Syncytial Virus DETECTED (A) NOT DETECTED Final    Comment: CRITICAL RESULT CALLED TO, READ BACK BY AND VERIFIED WITH: A.ROBERTS,RN 0457 05/08/18 G.MCADOO    Bordetella pertussis NOT DETECTED NOT DETECTED Final   Chlamydophila pneumoniae NOT DETECTED NOT DETECTED Final   Mycoplasma pneumoniae NOT DETECTED NOT DETECTED Final    Comment: Performed at [facility], [place] [address], [place], [place] [address]  MRSA PCR Screening     Status: Abnormal   Collection Time: 05/08/18  5:58 AM  Result Value Ref Range Status   MRSA by PCR POSITIVE (A) NEGATIVE Final    Comment:        The GeneXpert  MRSA Assay (FDA approved for NASAL specimens only), is one component of a comprehensive MRSA colonization surveillance program. It is not intended to diagnose MRSA infection nor to guide or monitor treatment for MRSA infections. RESULT CALLED TO, READ BACK BY AND VERIFIED WITH: Lorin Mercy RN, AT [phone] 05/08/18 BY Rush Landmark Performed at [facility], [place] [address] [place], [place] [address]   Expectorated sputum assessment w rflx to resp cult     Status: None   Collection Time: 05/09/18  5:34 AM  Result Value Ref Range Status   Specimen Description SPUTUM  Final   Special Requests NONE  Final   Sputum evaluation   Final    Sputum specimen not acceptable for testing.  Please recollect.   RESULT CALLED TO, READ BACK BY AND VERIFIED WITH: RN Madilyn Fireman [phone] FCP Performed at [facility] [address] [place], [place] [address]    Report Status 05/09/2018 FINAL  Final      Studies: [place] Soft Tissue Non Vascular  Result Date: 05/10/2018 CLINICAL DATA:  Indurated areas in left buttocks region. Evaluate for abscess. EXAM: ULTRASOUND LEFT LOWER EXTREMITY LIMITED TECHNIQUE: Ultrasound examination of the lower extremity soft tissues was performed in the area of clinical concern. COMPARISON:  None. FINDINGS: Focused  ultrasound exam was performed of the medial left buttock region, assessing the 2 more focal areas of redness/induration. The more inferior of the 2 areas shows subcutaneous edema with a more focal 1.0 x 1.2 cm apparent fluid collection without a well-defined border by ultrasound. The more cranial of the 2 areas also shows underlying edema without a discrete fluid collection evident. IMPRESSION: Subcutaneous edema noted in the 2 areas of concern involving the medial left buttocks region. A small 12 mm fluid collection is identified deep to the more caudal of the 2 areas and may represent a focal area of confluent edema although superinfection of this fluid cannot be excluded by ultrasound. Electronically Signed   By: Misty Stanley M.D.   On: 05/10/2018 17:59    Scheduled Meds: . arformoterol  15 mcg Nebulization BID  . atorvastatin  80 mg Oral QHS  . budesonide  0.5 mg Nebulization BID  . busPIRone  15 mg Oral BID  . carvedilol  25 mg Oral BID WC  . Chlorhexidine Gluconate Cloth  6 each Topical Q0600  . clopidogrel  75 mg Oral Daily  . divalproex  1,000 mg Oral Daily  . doxycycline  100 mg Oral Q12H  . enoxaparin (LOVENOX) injection  30 mg Subcutaneous Q24H  . folic acid  1 mg Oral Daily  . furosemide  80 mg Intravenous BID  . guaiFENesin  600 mg Oral BID  . hydrALAZINE  25 mg Oral Q8H  . insulin aspart  0-9 Units Subcutaneous TID WC  . insulin glargine  8 Units Subcutaneous QHS  . ipratropium-albuterol  3 mL Nebulization BID  . isosorbide mononitrate  30 mg Oral Daily  . levothyroxine  200 mcg Oral QAC breakfast  . Melatonin  3 mg Oral QHS  . morphine  15 mg Oral BID  . mupirocin ointment  1 application Nasal BID  . pantoprazole  40 mg Oral Daily  . polyethylene glycol  17 g Oral Daily  . senna-docusate  1 tablet Oral BID  . sodium chloride flush  3 mL Intravenous Q12H    Continuous Infusions: . sodium chloride Stopped (05/08/18 0231)     LOS: 4 days  Annita Brod, MD  [facility]  To reach me or the doctor on call, go to: [email or web address] Password [facility]  05/11/2018, 4:41 PM

## 2018-05-11 NOTE — Progress Notes (Signed)
Occupational Therapy Treatment Patient Details Name: Andrea [name] MRN: [id number] DOB: 29-May-1950 Today's Date: 05/11/2018    History of present illness 68 yo admitted with SOB and weakness from home after leaving SNF 4/13. Pt with CHF and PNA. Pt D/C from hospital to [facility] on 4/5. PMhx: CHF, CAD, breast CA, bil mastectomy, HTn, seizure, migraine, IDDM, CKD   OT comments  Pt is very motivated to return to safe level of independence. Today she was able to perform transfers at min guard/supervision level and participate in HEP for BUE (Please see below - provided with level 2 theraband). Her O2 went down to 90 on 1LO2. She was tearful today because her sons "won't" come visit her. It was explained that the hospital is not allowing ANY visitors at this time. She continues to display cognitive deficits like the example just provided. She asked if she thought it was ok if she went and stayed with her sister outside of [place] [facility] because her sons won't take her in right now..to which I replied that she needed to ask her MD. OT will continue to follow acutely but patient really is very motivated and wants to get independent.    Follow Up Recommendations  SNF;Home health OT    Equipment Recommendations  None recommended by OT(PT has appropriate DME)    Recommendations for Other Services      Precautions / Restrictions Precautions Precautions: Fall Precaution Comments: watch 02       Mobility Bed Mobility Overal bed mobility: Modified Independent Bed Mobility: Supine to Sit           General bed mobility comments: inceased time and effort, but no assist needed  Transfers Overall transfer level: Needs assistance Equipment used: None Transfers: Sit to/from Stand;Stand Pivot Transfers Sit to Stand: Min guard Stand pivot transfers: Min guard       General transfer comment: min guard for safety without RW for transfer to [facility]    Balance Overall balance assessment: Mild  deficits observed, not formally tested Sitting-balance support: No upper extremity supported;Feet supported Sitting balance-Leahy Scale: Good     Standing balance support: Bilateral upper extremity supported;During functional activity Standing balance-Leahy Scale: Fair                             ADL either performed or assessed with clinical judgement   ADL                       Lower Body Dressing: Moderate assistance;Bed level Lower Body Dressing Details (indicate cue type and reason): to don socks Toilet Transfer: Min Statistician Details (indicate cue type and reason): no physical assist needed, Pt able to manage O2 line Toileting- Clothing Manipulation and Hygiene: Supervision/safety;Sit to/from stand               Vision       Perception     Praxis      Cognition Arousal/Alertness: Awake/alert Behavior During Therapy: [facility] for tasks assessed/performed Overall Cognitive Status: No family/caregiver present to determine baseline cognitive functioning Area of Impairment: Safety/judgement;Awareness;Memory                   Current Attention Level: Selective Memory: Decreased short-term memory Following Commands: Follows one step commands consistently Safety/Judgement: Decreased awareness of safety Awareness: Emergent Problem Solving: Slow processing General Comments: Pt mad at her sons because they "won't come visit her" explained  that her sons are NOT allowed to come visit her because of hospital resistrictions        Exercises Exercises: General Upper Extremity General Exercises - Upper Extremity Shoulder Flexion: Strengthening;Right;Left;10 reps;Seated;Theraband Theraband Level (Shoulder Flexion): Level 2 (Red) Shoulder Horizontal ABduction: Strengthening;Both;10 reps;Seated;Theraband Theraband Level (Shoulder Horizontal Abduction): Level 2 (Red) Elbow Flexion: Strengthening;Both;10  reps;Seated;Theraband Theraband Level (Elbow Flexion): Level 2 (Red) Chair Push Up: Strengthening;10 reps;Seated(on BSC)   Shoulder Instructions       General Comments      Pertinent Vitals/ Pain       Pain Assessment: Faces Pain Score: 3  Faces Pain Scale: Hurts a little bit Pain Location: BLE Pain Descriptors / Indicators: Sore Pain Intervention(s): Limited activity within patient's tolerance  Home Living                                          Prior Functioning/Environment              Frequency  Min 3X/week        Progress Toward Goals  OT Goals(current goals can now be found in the care plan section)  Progress towards OT goals: Progressing toward goals  Acute Rehab OT Goals Patient Stated Goal: get stronger and go home OT Goal Formulation: With patient Time For Goal Achievement: 05/22/18 Potential to Achieve Goals: Good  Plan Discharge plan remains appropriate;Frequency remains appropriate    Co-evaluation                 AM-PAC OT "6 Clicks" Daily Activity     Outcome Measure   Help from another person eating meals?: None Help from another person taking care of personal grooming?: A Little Help from another person toileting, which includes using toliet, bedpan, or urinal?: A Little Help from another person bathing (including washing, rinsing, drying)?: A Little Help from another person to put on and taking off regular upper body clothing?: None Help from another person to put on and taking off regular lower body clothing?: A Little 6 Click Score: 20    End of Session Equipment Utilized During Treatment: Oxygen(1L O2)  OT Visit Diagnosis: Muscle weakness (generalized) (M62.81);Adult, failure to thrive (R62.7)   Activity Tolerance Patient tolerated treatment well   Patient Left in bed;with call bell/phone within reach   Nurse Communication Mobility status        Time: 8333-8329 OT Time Calculation (min): 43  min  Charges: OT General Charges $OT Visit: 1 Visit OT Treatments $Self Care/Home Management : 8-22 mins $Therapeutic Activity: 8-22 mins $Therapeutic Exercise: 8-22 mins  Hulda Humphrey OTR/L Acute Rehabilitation Services Pager: [phone] Office: [place] 05/11/2018, 5:09 PM

## 2018-05-12 DIAGNOSIS — G8929 Other chronic pain: Secondary | ICD-10-CM

## 2018-05-12 DIAGNOSIS — D631 Anemia in chronic kidney disease: Secondary | ICD-10-CM

## 2018-05-12 DIAGNOSIS — E039 Hypothyroidism, unspecified: Secondary | ICD-10-CM

## 2018-05-12 DIAGNOSIS — I10 Essential (primary) hypertension: Secondary | ICD-10-CM

## 2018-05-12 DIAGNOSIS — F411 Generalized anxiety disorder: Secondary | ICD-10-CM

## 2018-05-12 LAB — GLUCOSE, CAPILLARY
Glucose-Capillary: 90 mg/dL (ref 70–99)
Glucose-Capillary: 126 mg/dL — ABNORMAL HIGH (ref 70–99)
Glucose-Capillary: 126 mg/dL — ABNORMAL HIGH (ref 70–99)
Glucose-Capillary: 102 mg/dL — ABNORMAL HIGH (ref 70–99)

## 2018-05-12 LAB — BASIC METABOLIC PANEL
Anion gap: 9 (ref 5–15)
BUN: 80 mg/dL — ABNORMAL HIGH (ref 8–23)
CO2: 33 mmol/L — ABNORMAL HIGH (ref 22–32)
Calcium: 8.8 mg/dL — ABNORMAL LOW (ref 8.9–10.3)
Chloride: 103 mmol/L (ref 98–111)
Creatinine, Ser: 1.57 mg/dL — ABNORMAL HIGH (ref 0.44–1.00)
GFR calc Af Amer: 39 mL/min — ABNORMAL LOW (ref >60)
GFR calc non Af Amer: 34 mL/min — ABNORMAL LOW (ref >60)
Glucose, Bld: 176 mg/dL — ABNORMAL HIGH (ref 70–99)
Potassium: 5.3 mmol/L — ABNORMAL HIGH (ref 3.5–5.1)
Sodium: 145 mmol/L (ref 135–145)

## 2018-05-12 LAB — HEMOGLOBIN AND HEMATOCRIT, BLOOD
HCT: 28.6 % — ABNORMAL LOW (ref 36.0–46.0)
Hemoglobin: 9 g/dL — ABNORMAL LOW (ref 12.0–15.0)

## 2018-05-12 MED ORDER — SENNOSIDES-DOCUSATE SODIUM 8.6-50 MG PO TABS
2.0000 | ORAL_TABLET | Freq: Two times a day (BID) | ORAL | Status: DC
  Administered 2018-05-12 – 2018-05-21 (×13): 2 via ORAL
  Filled 2018-05-12 (×17): qty 2

## 2018-05-12 MED ORDER — PREGABALIN 75 MG PO CAPS
75.0000 mg | ORAL_CAPSULE | Freq: Two times a day (BID) | ORAL | Status: DC
  Administered 2018-05-12 – 2018-05-21 (×18): 75 mg via ORAL
  Filled 2018-05-12 (×18): qty 1

## 2018-05-12 MED ORDER — SODIUM ZIRCONIUM CYCLOSILICATE 10 G PO PACK
10.0000 g | PACK | Freq: Once | ORAL | Status: AC
  Administered 2018-05-12: 10 g via ORAL
  Filled 2018-05-12: qty 1

## 2018-05-12 MED ORDER — ATORVASTATIN CALCIUM 80 MG PO TABS: 80.0000 mg | ORAL_TABLET | Freq: Every day | ORAL | Status: DC

## 2018-05-12 MED ORDER — HYDRALAZINE HCL 50 MG PO TABS
100.0000 mg | ORAL_TABLET | Freq: Three times a day (TID) | ORAL | Status: DC
  Administered 2018-05-12 – 2018-05-17 (×14): 100 mg via ORAL
  Filled 2018-05-12 (×15): qty 2

## 2018-05-12 MED ORDER — LEVOTHYROXINE SODIUM 100 MCG PO TABS: 200.0000 ug | ORAL_TABLET | Freq: Every day | ORAL | Status: DC

## 2018-05-12 MED ORDER — GUAIFENESIN ER 600 MG PO TB12
1200.0000 mg | ORAL_TABLET | Freq: Two times a day (BID) | ORAL | Status: DC
  Administered 2018-05-12 – 2018-05-13 (×3): 1200 mg via ORAL
  Filled 2018-05-12 (×5): qty 2

## 2018-05-12 MED ORDER — HYDRALAZINE HCL 50 MG PO TABS: 50.0000 mg | ORAL_TABLET | Freq: Three times a day (TID) | ORAL | Status: DC

## 2018-05-12 NOTE — Plan of Care (Signed)
  Problem: Education: Goal: Knowledge of General Education information will improve Description Including pain rating scale, medication(s)/side effects and non-pharmacologic comfort measures Outcome: Progressing   Problem: Health Behavior/Discharge Planning: Goal: Ability to manage health-related needs will improve Outcome: Progressing   Problem: Clinical Measurements: Goal: Ability to maintain clinical measurements within normal limits will improve Outcome: Progressing Goal: Will remain free from infection Outcome: Progressing Goal: Diagnostic test results will improve Outcome: Progressing Goal: Respiratory complications will improve Outcome: Progressing Goal: Cardiovascular complication will be avoided Outcome: Progressing   Problem: Nutrition: Goal: Adequate nutrition will be maintained Outcome: Progressing   Problem: Pain Managment: Goal: General experience of comfort will improve Outcome: Progressing   Problem: Education: Goal: Ability to demonstrate management of disease process will improve Outcome: Progressing Goal: Ability to verbalize understanding of medication therapies will improve Outcome: Progressing   Problem: Cardiac: Goal: Ability to achieve and maintain adequate cardiopulmonary perfusion will improve Outcome: Progressing

## 2018-05-12 NOTE — Progress Notes (Signed)
Physical Therapy Treatment Patient Details Name: Andrea [name] MRN: [id number] DOB: Jun 17, 1950 Today's Date: 05/12/2018    History of Present Illness 68 yo admitted with SOB and weakness from home after leaving SNF 4/13. Pt with CHF and PNA. Pt D/C from hospital to [place] on 4/5. PMhx: CHF, CAD, breast CA, bil mastectomy, HTn, seizure, migraine, IDDM, CKD    PT Comments    Pt pleasant and continues to progress with gait and HEP. Pt tolerating gait with SpO2 95% on 1L and return to room with pt 92% on RA. Pt educated for bil LE HEP with continued weakness in hip abduct/ADD. Encouraged increased mobility with nursing.     Follow Up Recommendations  Home health PT;Supervision/Assistance - 24 hour     Equipment Recommendations  None recommended by PT    Recommendations for Other Services       Precautions / Restrictions Precautions Precautions: Fall Precaution Comments: watch 02    Mobility  Bed Mobility Overal bed mobility: Modified Independent Bed Mobility: Supine to Sit           General bed mobility comments: HOB 10 degree with use of rail   Transfers     Transfers: Sit to/from Stand Sit to Stand: Supervision         General transfer comment: supervision for positioning in middle of surface to sit end of session in chair. Pt able to stand from bed and transition to and from [facility] without cues  Ambulation/Gait Ambulation/Gait assistance: Supervision Gait Distance (Feet): 400 Feet Assistive device: Rolling walker (2 wheeled) Gait Pattern/deviations: Step-through pattern;Decreased stride length   Gait velocity interpretation: 1.31 - 2.62 ft/sec, indicative of limited community ambulator General Gait Details: pt with improved speed and tolerance with 1 standing rest breaks, cues for breathing technique, no cues for positioning in RW this session   Stairs             Wheelchair Mobility    Modified Rankin (Stroke Patients Only)       Balance  Overall balance assessment: Mild deficits observed, not formally tested;History of Falls   Sitting balance-Leahy Scale: Good       Standing balance-Leahy Scale: Fair Standing balance comment: pt able to stand from [facility] without UE support, utilizes RW for gait                            Cognition Arousal/Alertness: Awake/alert Behavior During Therapy: WFL for tasks assessed/performed Overall Cognitive Status: No family/caregiver present to determine baseline cognitive functioning Area of Impairment: Safety/judgement;Awareness;Memory                   Current Attention Level: Selective Memory: Decreased short-term memory Following Commands: Follows one step commands consistently Safety/Judgement: Decreased awareness of safety   Problem Solving: Slow processing        Exercises General Exercises - Lower Extremity Long Arc Quad: AROM;Both;Seated;20 reps Hip ABduction/ADduction: AAROM;15 reps;Seated;Both Hip Flexion/Marching: AROM;15 reps;Seated;Both    General Comments        Pertinent Vitals/Pain Pain Assessment: No/denies pain    Home Living                      Prior Function            PT Goals (current goals can now be found in the care plan section) Progress towards PT goals: Progressing toward goals    Frequency  PT Plan Current plan remains appropriate    Co-evaluation              AM-PAC PT "6 Clicks" Mobility   Outcome Measure  Help needed turning from your back to your side while in a flat bed without using bedrails?: None Help needed moving from lying on your back to sitting on the side of a flat bed without using bedrails?: A Little Help needed moving to and from a bed to a chair (including a wheelchair)?: None Help needed standing up from a chair using your arms (e.g., wheelchair or bedside chair)?: None Help needed to walk in hospital room?: A Little Help needed climbing 3-5 steps with a railing? :  A Little 6 Click Score: 21    End of Session Equipment Utilized During Treatment: Oxygen Activity Tolerance: Patient tolerated treatment well Patient left: in chair;with call bell/phone within reach Nurse Communication: Mobility status PT Visit Diagnosis: Muscle weakness (generalized) (M62.81);Unsteadiness on feet (R26.81);Other abnormalities of gait and mobility (R26.89)     Time: 4742-5956 PT Time Calculation (min) (ACUTE ONLY): 32 min  Charges:  $Gait Training: 8-22 mins $Therapeutic Exercise: 8-22 mins                     Andrea [name], PT Acute Rehabilitation Services Pager: [phone] Office: [phone]    Andrea [name] 05/12/2018, 11:45 AM

## 2018-05-12 NOTE — Progress Notes (Signed)
PROGRESS NOTE    Andrea [name]  UMP:[id number] DOB: 05-30-1950 DOA: 05/07/2018 PCP: [facility]   Brief Narrative:  Patient is a 68 year old female past medical history of diastolic CHF, three-vessel CAD status post PCI, diabetes mellitus and stage III-4 CKD who presented to the emergency room on 4/16 with complaints of shortness of breath as well as weakness, unable to pick herself up off the floor.  She had been just discharged 10 days prior for a multi lobar pneumonia from RSV plus CHF exacerbation.  She had just left her skilled nursing facility after discharge 3 days before coming in to the back to the emergency room.  COVID ruled out.  Patient found to be in significant volume overload with 30 pound weight gain.  Also complained of right buttock pain where she is found to have an area of induration with fluctuance.    Assessment & Plan:   Principal Problem:   Acute on chronic diastolic congestive heart failure (HCC) Active Problems:   Cellulitis and abscess of buttock   Essential hypertension   Insulin dependent diabetes mellitus (HCC)   Hypothyroidism   History of seizures   Chronic pain   CAD S/P percutaneous coronary angioplasty   Seizure (HCC)   Anemia   GAD (generalized anxiety disorder)   Hyperkalemia   CKD (chronic kidney disease), stage III (HCC)   Bilateral leg edema   FTT (failure to thrive) in adult   Pressure injury of skin  1 acute on chronic diastolic heart failure Questionable etiology.  Patient denies any dietary indiscretions.  Patient does endorse compliance with all her medications.  Cardiac enzymes obtained on admission with a troponin of 0.04.  Patient noted to have a 30 pound weight gain over the course of 3 weeks and noted to have 3-4+ bilateral pitting edema.  Lower extremity Dopplers negative for DVT.  Patient with some clinical improvement.  The urine output of 1.6 L over the past 24 hours.  Patient is -1.351 L during  this hospitalization.  Patient with a weight of 184.9 pounds from 177 pounds on admission.  Patient still with significant lower extremity edema.  Continue current dose of Lasix 80 mg IV every 12 hours.  Continue Lipitor, Coreg, Plavix,imdur, hydralazine.  Strict I's and O's.  Daily weights.  2.  Left buttocks cellulitis and abscess, POA Patient noted to have a significant cellulitis and abscess of the left gluteus with significant induration with necrotic center, erythema, warmth, tenderness to palpation.  Ultrasound of left gluteus was obtained subcutaneous edema noted in 2 areas of concern involving the medial left buttocks region.  Small 12 mm fluid collection identified deep to the more caudal of the 2 areas and may represent a focal area of confluent edema although superimposed infection of this fluid cannot be excluded by ultrasound.  Due to the significant induration will consult with general surgery for probable I&D and further evaluation and management.  Patient currently on oral doxycycline which we will continue for now.  Supportive care.  3.  Hyperkalemia Questionable etiology.  Patient on IV Lasix.  Give a dose of Lokelma.  4.  Bilateral lung infiltrates Patient noted to have negative COVID-19 testing.  RSV was positive on respiratory viral panel on 04/27/2018 and was also positive on 04/11/2018.  Concern for ongoing RSV viral shedding.  Patient currently afebrile.  No lympho-pia.  No leukocytosis noted.  Patient with dry congested cough.  Continue diuresis with IV Lasix, Mucinex, Brovana, Pulmicort, duo nebs.  Supportive care.  5.  Coronary artery disease Stable.  Patient denies any chest pain.  Continue cardiac medications of Lipitor, Plavix, Coreg, Imdur, hydralazine.  Will need outpatient follow-up with cardiology.  6.  Insulin-dependent diabetes mellitus type 2 Hemoglobin A1c was 6.4 on 03/15/2018.  CBG of 90 this morning.  Continue current regimen of Lantus and sliding scale  insulin.  7.  Hypothyroidism Continue home dose Synthroid.  Outpatient follow-up.  8.  Hypertension Systolic blood pressures noted to be in the 150s to the 170s.  Continue Coreg, Imdur.  Increase hydralazine back to home dose of 100 mg 3 times daily.  Follow.  9.  Hypernatremia Improved.  Sodium at 145 this morning from 148 on 05/11/2018.  10.  Seizure disorder Stable.  Continue Depakote.  11.  Anxiety disorder BuSpar.  12.  Chronic pain Continue current regimen of pain medication.  13.  History of breast cancer  14.  Debility Patient declines returning back to skilled nursing facility.  Will need to go home with home health on discharge.  15.  Anemia of chronic kidney disease stage III Renal function improving.  Creatinine currently at 1.57 from 2.37 on admission.  Renal function trending down with diuresis.  Monitor.    DVT prophylaxis: Lovenox Code Status: Full Family Communication: Updated patient.  No family at bedside. Disposition Plan: Patient wants to be discharged home with home health.  Patient does not want to go back to skilled nursing facility.   Consultants:   General surgery pending  Procedures:   CT chest 05/07/2018  CXR 05/07/2018  Lower extremity Dopplers 05/09/2018  [place] Left buttocks 05/10/2018  Antimicrobials:   Augmentin 05/08/2018>>>> 05/10/2018  Oral doxycycline 05/07/2018>>>>  IV Rocephin 05/07/2018>>>> 05/08/2018     Subjective: Patient sitting up in chair has just finished working with physical therapy.  Patient with some shortness of breath however states improving since admission.  Patient denies any chest pain.  Patient complaining of pain in her left buttocks when she lays down or sits down.  Patient denies any chest pain.  Patient denies any dietary indiscretion.  Patient does endorse compliance with all her medications since discharge.  Objective: Vitals:   05/12/18 0604 05/12/18 0904 05/12/18 1141 05/12/18 1203  BP: (!) 168/69  (!) 173/71  (!) 168/72  Pulse: 89 90  80  Resp:    18  Temp: 97.9 F (36.6 C)   98.8 F (37.1 C)  TempSrc: Oral   Oral  SpO2: 98% 95% 92% 92%  Weight: 83.9 kg     Height:        Intake/Output Summary (Last 24 hours) at 05/12/2018 1545 Last data filed at 05/12/2018 1400 Gross per 24 hour  Intake 1323 ml  Output 1450 ml  Net -127 ml   Filed Weights   05/10/18 0531 05/11/18 0023 05/12/18 0604  Weight: 81.7 kg 81.6 kg 83.9 kg    Examination:  General exam: Appears calm and comfortable  Respiratory system: Clear to auscultation.  No wheezing, no crackles, no rhonchi.  Decreased breath sounds in the bases.  Respiratory effort normal. Cardiovascular system: S1 & S2 heard, RRR. No JVD, murmurs, rubs, gallops or clicks.  3-4+ bilateral lower extremity edema. Gastrointestinal system: Abdomen is nondistended, soft and nontender. No organomegaly or masses felt. Normal bowel sounds heard. Central nervous system: Alert and oriented. No focal neurological deficits. Extremities: 3-4+ bilateral lower extremity edema. Skin: Left buttocks with approximately a 4 x 6 cm area of significant induration with central necrosis,  surrounding erythema, warmth, significant tenderness to palpation.  A 2 x 2 area of induration with whitehead noted with some surrounding erythema and no active drainage. Psychiatry: Judgement and insight appear normal. Mood & affect appropriate.     Data Reviewed: I have personally reviewed following labs and imaging studies  CBC: Recent Labs  Lab 05/07/18 1432 05/08/18 1002 05/09/18 0602 05/10/18 0505 05/11/18 0802 05/12/18 1000  WBC 6.9 8.3 6.9 6.2 8.3  --   NEUTROABS 5.3  --  4.5 4.2  --   --   HGB 7.8* 9.6* 8.6* 8.7* 9.7* 9.0*  HCT 26.1* 30.2* 26.3* 27.8* 31.4* 28.6*  MCV 94.6 92.9 92.9 94.6 94.3  --   PLT 118* 129* 109* 97* 123*  --    Basic Metabolic Panel: Recent Labs  Lab 05/08/18 1002 05/09/18 0602 05/10/18 0505 05/11/18 0802 05/12/18 1000  NA 143  145 143 148* 145  K 5.0 5.3* 5.1 5.4* 5.3*  CL 103 103 102 106 103  CO2 28 28 30 30  33*  GLUCOSE 168* 94 187* 94 176*  BUN 113* 110* 98* 84* 80*  CREATININE 2.07* 1.86* 1.52* 1.44* 1.57*  CALCIUM 8.6* 8.6* 8.3* 8.8* 8.8*   GFR: Estimated Creatinine Clearance: 36.4 mL/min (A) (by C-G formula based on SCr of 1.57 mg/dL (H)). Liver Function Tests: Recent Labs  Lab 05/07/18 1432  AST 47*  ALT 30  ALKPHOS 51  BILITOT 0.9  PROT 5.7*  ALBUMIN 2.8*   Recent Labs  Lab 05/07/18 1432  LIPASE 32   No results for input(s): AMMONIA in the last 168 hours. Coagulation Profile: No results for input(s): INR, PROTIME in the last 168 hours. Cardiac Enzymes: Recent Labs  Lab 05/07/18 1630  TROPONINI 0.04*   BNP (last 3 results) No results for input(s): PROBNP in the last 8760 hours. HbA1C: No results for input(s): HGBA1C in the last 72 hours. CBG: Recent Labs  Lab 05/11/18 1143 05/11/18 1607 05/11/18 2118 05/12/18 0613 05/12/18 1157  GLUCAP 110* 147* 182* 90 126*   Lipid Profile: No results for input(s): CHOL, HDL, LDLCALC, TRIG, CHOLHDL, LDLDIRECT in the last 72 hours. Thyroid Function Tests: No results for input(s): TSH, T4TOTAL, FREET4, T3FREE, THYROIDAB in the last 72 hours. Anemia Panel: Recent Labs    05/10/18 0505  VITAMINB12 691  FOLATE 5.0*  TIBC 186*  IRON 63  RETICCTPCT 0.4   Sepsis Labs: Recent Labs  Lab 05/08/18 0020  PROCALCITON 0.11    Recent Results (from the past 240 hour(s))  SARS Coronavirus 2 [facility] order, Performed in [place] hospital lab)     Status: None   Collection Time: 05/07/18  3:58 PM  Result Value Ref Range Status   SARS Coronavirus 2 NEGATIVE NEGATIVE Final    Comment: (NOTE) If result is NEGATIVE SARS-CoV-2 target nucleic acids are NOT DETECTED. The SARS-CoV-2 RNA is generally detectable in upper and lower  respiratory specimens during the acute phase of infection. The lowest  concentration of SARS-CoV-2 viral copies  this assay can detect is 250  copies / mL. A negative result does not preclude SARS-CoV-2 infection  and should not be used as the sole basis for treatment or other  patient management decisions.  A negative result may occur with  improper specimen collection / handling, submission of specimen other  than nasopharyngeal swab, presence of viral mutation(s) within the  areas targeted by this assay, and inadequate number of viral copies  (<250 copies / mL). A negative result must be combined  with clinical  observations, patient history, and epidemiological information. If result is POSITIVE SARS-CoV-2 target nucleic acids are DETECTED. The SARS-CoV-2 RNA is generally detectable in upper and lower  respiratory specimens dur ing the acute phase of infection.  Positive  results are indicative of active infection with SARS-CoV-2.  Clinical  correlation with patient history and other diagnostic information is  necessary to determine patient infection status.  Positive results do  not rule out bacterial infection or co-infection with other viruses. If result is PRESUMPTIVE POSTIVE SARS-CoV-2 nucleic acids MAY BE PRESENT.   A presumptive positive result was obtained on the submitted specimen  and confirmed on repeat testing.  While 2019 novel coronavirus  (SARS-CoV-2) nucleic acids may be present in the submitted sample  additional confirmatory testing may be necessary for epidemiological  and / or clinical management purposes  to differentiate between  SARS-CoV-2 and other Sarbecovirus currently known to infect humans.  If clinically indicated additional testing with an alternate test  methodology [phone]) is advised. The SARS-CoV-2 RNA is generally  detectable in upper and lower respiratory sp ecimens during the acute  phase of infection. The expected result is Negative. Fact Sheet for Patients:  StrictlyIdeas.no Fact Sheet for Healthcare  Providers: BankingDealers.co.za This test is not yet approved or cleared by the [place] FDA and has been authorized for detection and/or diagnosis of SARS-CoV-2 by FDA under an Emergency Use Authorization (EUA).  This EUA will remain in effect (meaning this test can be used) for the duration of the COVID-19 declaration under Section 564(b)(1) of the Act, 21 U.S.C. section 360bbb-3(b)(1), unless the authorization is terminated or revoked sooner. Performed at [facility], [place] [address], [place], [place] [address]   Respiratory Panel by PCR     Status: Abnormal   Collection Time: 05/07/18  3:58 PM  Result Value Ref Range Status   Adenovirus NOT DETECTED NOT DETECTED Final   Coronavirus 229E NOT DETECTED NOT DETECTED Final    Comment: (NOTE) The Coronavirus on the Respiratory Panel, DOES NOT test for the novel  Coronavirus (2019 nCoV)    Coronavirus HKU1 NOT DETECTED NOT DETECTED Final   Coronavirus NL63 NOT DETECTED NOT DETECTED Final   Coronavirus OC43 NOT DETECTED NOT DETECTED Final   Metapneumovirus NOT DETECTED NOT DETECTED Final   Rhinovirus / Enterovirus NOT DETECTED NOT DETECTED Final   Influenza A NOT DETECTED NOT DETECTED Final   Influenza B NOT DETECTED NOT DETECTED Final   Parainfluenza Virus 1 NOT DETECTED NOT DETECTED Final   Parainfluenza Virus 2 NOT DETECTED NOT DETECTED Final   Parainfluenza Virus 3 NOT DETECTED NOT DETECTED Final   Parainfluenza Virus 4 NOT DETECTED NOT DETECTED Final   Respiratory Syncytial Virus DETECTED (A) NOT DETECTED Final    Comment: CRITICAL RESULT CALLED TO, READ BACK BY AND VERIFIED WITH: A.ROBERTS,RN 0457 05/08/18 G.MCADOO    Bordetella pertussis NOT DETECTED NOT DETECTED Final   Chlamydophila pneumoniae NOT DETECTED NOT DETECTED Final   Mycoplasma pneumoniae NOT DETECTED NOT DETECTED Final    Comment: Performed at [facility], [place] [address] [place], [place] [address]  MRSA PCR Screening      Status: Abnormal   Collection Time: 05/08/18  5:58 AM  Result Value Ref Range Status   MRSA by PCR POSITIVE (A) NEGATIVE Final    Comment:        The GeneXpert MRSA Assay (FDA approved for NASAL specimens only), is one component of a comprehensive MRSA colonization surveillance  program. It is not intended to diagnose MRSA infection nor to guide or monitor treatment for MRSA infections. RESULT CALLED TO, READ BACK BY AND VERIFIED WITH: Lorin Mercy RN, AT [phone] 05/08/18 BY Rush Landmark Performed at [facility], [place] [address], [place], [place] [address]   Expectorated sputum assessment w rflx to resp cult     Status: None   Collection Time: 05/09/18  5:34 AM  Result Value Ref Range Status   Specimen Description SPUTUM  Final   Special Requests NONE  Final   Sputum evaluation   Final    Sputum specimen not acceptable for testing.  Please recollect.   RESULT CALLED TO, READ BACK BY AND VERIFIED WITH: RN Madilyn Fireman [phone] FCP Performed at [facility] [address] [place], [place] [address]    Report Status 05/09/2018 FINAL  Final         Radiology Studies: [place] Burr Soft Tissue Non Vascular  Result Date: 05/10/2018 CLINICAL DATA:  Indurated areas in left buttocks region. Evaluate for abscess. EXAM: ULTRASOUND LEFT LOWER EXTREMITY LIMITED TECHNIQUE: Ultrasound examination of the lower extremity soft tissues was performed in the area of clinical concern. COMPARISON:  None. FINDINGS: Focused ultrasound exam was performed of the medial left buttock region, assessing the 2 more focal areas of redness/induration. The more inferior of the 2 areas shows subcutaneous edema with a more focal 1.0 x 1.2 cm apparent fluid collection without a well-defined border by ultrasound. The more cranial of the 2 areas also shows underlying edema without a discrete fluid collection evident. IMPRESSION: Subcutaneous edema noted in the 2 areas of concern involving the medial  left buttocks region. A small 12 mm fluid collection is identified deep to the more caudal of the 2 areas and may represent a focal area of confluent edema although superinfection of this fluid cannot be excluded by ultrasound. Electronically Signed   By: Misty Stanley M.D.   On: 05/10/2018 17:59        Scheduled Meds:  arformoterol  15 mcg Nebulization BID   atorvastatin  80 mg Oral QHS   atorvastatin  80 mg Oral QHS   budesonide  0.5 mg Nebulization BID   busPIRone  15 mg Oral BID   carvedilol  25 mg Oral BID WC   Chlorhexidine Gluconate Cloth  6 each Topical Q0600   clopidogrel  75 mg Oral Daily   divalproex  1,000 mg Oral Daily   doxycycline  100 mg Oral Q12H   enoxaparin (LOVENOX) injection  30 mg Subcutaneous P29J   folic acid  1 mg Oral Daily   furosemide  80 mg Intravenous BID   guaiFENesin  1,200 mg Oral BID   hydrALAZINE  100 mg Oral Q8H   insulin aspart  0-9 Units Subcutaneous TID WC   insulin glargine  8 Units Subcutaneous QHS   ipratropium-albuterol  3 mL Nebulization BID   isosorbide mononitrate  30 mg Oral Daily   levothyroxine  200 mcg Oral QAC breakfast   Melatonin  3 mg Oral QHS   morphine  15 mg Oral BID   mupirocin ointment  1 application Nasal BID   pantoprazole  40 mg Oral Daily   polyethylene glycol  17 g Oral Daily   pregabalin  75 mg Oral BID   senna-docusate  2 tablet Oral BID   sodium chloride flush  3 mL Intravenous Q12H   Continuous Infusions:  sodium chloride Stopped (05/08/18 0231)  LOS: 5 days    Time spent: 40 minutes    Irine Seal, MD [facility]  If 7PM-7AM, please contact night-coverage [email or web address] 05/12/2018, 3:45 PM

## 2018-05-12 NOTE — Consult Note (Signed)
[facility] Consult Note  Andrea [name] 03/07/1950  [id number].    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 Of note patient was recently admitted from 04/11/2018-04/26/2018 for acute respiratory failure with hypoxia secondary to multilobar pneumonia from RSV and CHF exacerbation. She had been discharged to [facility] from the hospital, and then discharged home on 4/13 from rehab.   General surgery asked to see regarding buttock pain. Patient states that it started nearly one week ago. Pain is constant, worse with sitting. Denies drainage from the area. Ultrasound shows subcutaneous edema in 2 areas involving the medial left buttocks region.   Lives at home alone Ambulates with a walker  ROS: Review of Systems  Constitutional: Positive for chills and malaise/fatigue.  HENT: Negative.   Eyes: Negative.   Respiratory: Positive for cough, sputum production and shortness of breath.   Cardiovascular: Positive for leg swelling. Negative for chest pain.  Gastrointestinal: Negative for abdominal pain, constipation, diarrhea, nausea and vomiting.  Genitourinary: Negative.   Musculoskeletal: Positive for back pain, falls and joint pain.  Skin: Negative.   Neurological: Negative.     All systems reviewed and otherwise negative except for as above  Family History  Problem Relation Age of Onset  . Sudden Cardiac Death Neg Hx     Past Medical History:  Diagnosis Date  . Breast cancer  ([place])    bilat mastectomy and LUE lymphadenectomy 2014, chemoRx 2014 - 15  . CHF (congestive heart failure) ([place])   . Diabetes mellitus without complication ([place])   . Esophageal reflux   . Hypertension   . Hypothyroid   . Migraine   . Seizure [facility])     Past Surgical History:  Procedure Laterality Date  . ABDOMINAL HYSTERECTOMY     1984 , done for ruptured cyst and endometriosis  . APPENDECTOMY    . CHOLECYSTECTOMY     1980's  . CORONARY STENT INTERVENTION N/A 12/30/2017   Procedure: CORONARY STENT INTERVENTION;  Surgeon: Martinique, Peter M, MD;  Location: [facility] CV LAB;  Service: Cardiovascular;  Laterality: N/A;  . EYE SURGERY  08/28/2015   Right eye  . KNEE SURGERY Left   . MASTECTOMY     bilateral  . Open surgery for bowel obstruction, 2000's    . RIGHT/LEFT HEART CATH AND CORONARY ANGIOGRAPHY N/A 12/29/2017   Procedure: RIGHT/LEFT HEART CATH AND CORONARY ANGIOGRAPHY;  Surgeon: Martinique, Peter M, MD;  Location: [place] CV LAB;  Service: Cardiovascular;  Laterality: N/A;    Social History:  reports that she has never smoked. She has never used smokeless tobacco. She reports that she does not drink alcohol or use drugs.  Allergies:  Allergies  Allergen Reactions  . Ticagrelor Rash  . Aspartame And Phenylalanine Hives and Diarrhea  . Aspirin Nausea And Vomiting  . Maxzide [Hydrochlorothiazide W-Triamterene] Swelling    Fluid retention  . Metformin And Related Diarrhea  . Nsaids Nausea And Vomiting  . Other Other (See Comments)    All steroids produce psychosis per pt Artificial sweeteners produce nausea and upset  stomach.  Gregary Cromer [Pravastatin] Other (See Comments)    unknown  . Spironolactone   . Stadol [Butorphanol] Other (See Comments)    Toradol, etc And related- hallucinations  . Toradol [Ketorolac Tromethamine] Other (See Comments)    Hallucinations   . Vistaril [Hydroxyzine Hcl] Other (See Comments)  . Erythromycin Itching and Rash  . Morphine And  Related Hives and Rash    Morphine given via IV    Medications Prior to Admission  Medication Sig Dispense Refill  . acetaminophen (TYLENOL) 325 MG tablet Take 2 tablets (650 mg total) by mouth every 6 (six) hours as needed for mild pain, fever or headache.    . budesonide (PULMICORT) 0.5 MG/2ML nebulizer solution Take 2 mLs (0.5 mg total) by nebulization 2 (two) times daily. 20 mL 12  . carvedilol (COREG) 6.25 MG tablet Take 6.25 mg by mouth 2 (two) times daily with a meal.    . clopidogrel (PLAVIX) 75 MG tablet Take 1 tablet (75 mg total) by mouth daily. 30 tablet 11  . pregabalin (LYRICA) 75 MG capsule Take 75 mg by mouth 2 (two) times daily.    . promethazine (PHENERGAN) 25 MG tablet Take 25 mg by mouth every 6 (six) hours as needed for nausea.    [name] spironolactone (ALDACTONE) 25 MG tablet Take 12.5 mg by mouth every morning.    [name] arformoterol (BROVANA) 15 MCG/2ML NEBU Take 2 mLs (15 mcg total) by nebulization 2 (two) times daily. (Patient not taking: Reported on 05/08/2018) 120 mL 0  . atorvastatin (LIPITOR) 80 MG tablet Take 1 tablet (80 mg total) by mouth at bedtime. (Patient not taking: Reported on 05/08/2018) 30 tablet 3  . carvedilol (COREG) 25 MG tablet Take 1 tablet (25 mg total) by mouth 2 (two) times daily with a meal. (Patient not taking: Reported on 05/08/2018) 30 tablet 0  . furosemide (LASIX) 40 MG tablet Take 1 tablet (40 mg total) by mouth daily. Start taking 04/30/2018 check bmp 04/28/2018 (Patient not taking: Reported on 05/08/2018) 30 tablet 1  . guaiFENesin (MUCINEX) 600 MG 12 hr tablet Take 2 tablets (1,200 mg total) by mouth 2 (two) times daily. (Patient not taking: Reported on 05/08/2018)    . hydrALAZINE (APRESOLINE) 100 MG tablet Take 1 tablet (100 mg total) by mouth every 8 (eight) hours. (Patient not taking: Reported on 05/08/2018) 90 tablet 0  . insulin glargine (LANTUS) 100 UNIT/ML injection Inject 0.1 mLs (10 Units total) into the skin at bedtime. (Patient not taking:  Reported on 05/08/2018) 10 mL 11  . isosorbide mononitrate (IMDUR) 30 MG 24 hr tablet Take 1 tablet (30 mg total) by mouth daily. (Patient not taking: Reported on 05/08/2018) 30 tablet 0  . levothyroxine (SYNTHROID, LEVOTHROID) 200 MCG tablet Take 1 tablet (200 mcg total) by mouth at bedtime. (Patient not taking: Reported on 05/08/2018) 30 tablet 0  . LORazepam (ATIVAN) 0.5 MG tablet Take 1 tablet (0.5 mg total) by mouth every 6 (six) hours as needed for anxiety. (Patient not taking: Reported on 05/08/2018) 30 tablet 0  . morphine (MS CONTIN) 15 MG 12 hr tablet Take 1 tablet (15 mg total) by mouth 2 (two) times daily. (Patient not taking: Reported on 05/08/2018) 20 tablet 0  . Multiple Vitamin (MULTIVITAMIN WITH MINERALS) TABS tablet Take 1 tablet by mouth daily. (Patient not taking: Reported on 05/08/2018)    . oxyCODONE (OXY IR/ROXICODONE) 5 MG immediate release tablet Take 1-2 tablets (5-10 mg total) by mouth every 4 (four) hours as  needed for severe pain. (Patient not taking: Reported on 05/08/2018) 30 tablet 0  . predniSONE (DELTASONE) 10 MG tablet Take 4 tablets for the first 4 days then 3 tablets for the following 4 days then 2 tablets for the following 4 days and then 1 tablet daily till done (Patient not taking: Reported on 05/08/2018) 45 tablet 0  . senna-docusate (SENOKOT-S) 8.6-50 MG tablet Take 2 tablets by mouth 2 (two) times daily. (Patient not taking: Reported on 05/08/2018)      Prior to Admission medications   Medication Sig Start Date End Date Taking? Authorizing Provider  acetaminophen (TYLENOL) 325 MG tablet Take 2 tablets (650 mg total) by mouth every 6 (six) hours as needed for mild pain, fever or headache. 04/26/18  Yes Georgette Shell, MD  budesonide (PULMICORT) 0.5 MG/2ML nebulizer solution Take 2 mLs (0.5 mg total) by nebulization 2 (two) times daily. 04/26/18  Yes Georgette Shell, MD  carvedilol (COREG) 6.25 MG tablet Take 6.25 mg by mouth 2 (two) times daily with a meal.    Yes [provider]  clopidogrel (PLAVIX) 75 MG tablet Take 1 tablet (75 mg total) by mouth daily. 04/26/18 04/26/19 Yes Georgette Shell, MD  pregabalin (LYRICA) 75 MG capsule Take 75 mg by mouth 2 (two) times daily.   Yes [provider]  promethazine (PHENERGAN) 25 MG tablet Take 25 mg by mouth every 6 (six) hours as needed for nausea.   Yes [provider]  spironolactone (ALDACTONE) 25 MG tablet Take 12.5 mg by mouth every morning.   Yes [provider]  arformoterol (BROVANA) 15 MCG/2ML NEBU Take 2 mLs (15 mcg total) by nebulization 2 (two) times daily. Patient not taking: Reported on 05/08/2018 04/26/18   Georgette Shell, MD  atorvastatin (LIPITOR) 80 MG tablet Take 1 tablet (80 mg total) by mouth at bedtime. Patient not taking: Reported on 05/08/2018 01/28/18   Lendon Colonel, NP  carvedilol (COREG) 25 MG tablet Take 1 tablet (25 mg total) by mouth 2 (two) times daily with a meal. Patient not taking: Reported on 05/08/2018 04/26/18   Georgette Shell, MD  furosemide (LASIX) 40 MG tablet Take 1 tablet (40 mg total) by mouth daily. Start taking 04/30/2018 check bmp 04/28/2018 Patient not taking: Reported on 05/08/2018 04/26/18 04/26/19  Georgette Shell, MD  guaiFENesin (MUCINEX) 600 MG 12 hr tablet Take 2 tablets (1,200 mg total) by mouth 2 (two) times daily. Patient not taking: Reported on 05/08/2018 04/26/18   Georgette Shell, MD  hydrALAZINE (APRESOLINE) 100 MG tablet Take 1 tablet (100 mg total) by mouth every 8 (eight) hours. Patient not taking: Reported on 05/08/2018 03/07/18   Lavina Hamman, MD  insulin glargine (LANTUS) 100 UNIT/ML injection Inject 0.1 mLs (10 Units total) into the skin at bedtime. Patient not taking: Reported on 05/08/2018 03/27/18   Debbe Odea, MD  isosorbide mononitrate (IMDUR) 30 MG 24 hr tablet Take 1 tablet (30 mg total) by mouth daily. Patient not taking: Reported on 05/08/2018 03/08/18   Lavina Hamman, MD   levothyroxine (SYNTHROID, LEVOTHROID) 200 MCG tablet Take 1 tablet (200 mcg total) by mouth at bedtime. Patient not taking: Reported on 05/08/2018 01/12/17   Lavina Hamman, MD  LORazepam (ATIVAN) 0.5 MG tablet Take 1 tablet (0.5 mg total) by mouth every 6 (six) hours as needed for anxiety. Patient not taking: Reported on 05/08/2018 04/26/18   Georgette Shell, MD  morphine (MS CONTIN) 15 MG 12 hr  tablet Take 1 tablet (15 mg total) by mouth 2 (two) times daily. Patient not taking: Reported on 05/08/2018 04/26/18   Georgette Shell, MD  Multiple Vitamin (MULTIVITAMIN WITH MINERALS) TABS tablet Take 1 tablet by mouth daily. Patient not taking: Reported on 05/08/2018 03/27/18   Debbe Odea, MD  oxyCODONE (OXY IR/ROXICODONE) 5 MG immediate release tablet Take 1-2 tablets (5-10 mg total) by mouth every 4 (four) hours as needed for severe pain. Patient not taking: Reported on 05/08/2018 04/26/18   Georgette Shell, MD  predniSONE (DELTASONE) 10 MG tablet Take 4 tablets for the first 4 days then 3 tablets for the following 4 days then 2 tablets for the following 4 days and then 1 tablet daily till done Patient not taking: Reported on 05/08/2018 04/26/18   Georgette Shell, MD  senna-docusate (SENOKOT-S) 8.6-50 MG tablet Take 2 tablets by mouth 2 (two) times daily. Patient not taking: Reported on 05/08/2018 01/22/18   Geradine Girt, DO    Blood pressure (!) 168/72, pulse 80, temperature 98.8 F (37.1 C), temperature source Oral, resp. rate 18, height 5\' 4"  (1.626 m), weight 83.9 kg, SpO2 92 %. Physical Exam: General: pleasant, frail appearing white female who is laying in bed in NAD HEENT: head is normocephalic, atraumatic.  Sclera are noninjected.  Pupils equal and round.  Ears and nose without any masses or lesions.  Mouth is pink and moist. Dentition fair Heart: regular, rate, and rhythm.  No obvious murmurs, gallops, or rubs noted.  Palpable pedal pulses bilaterally Lungs: CTAB, no wheezes,  rhonchi, or rales noted.  Respiratory effort nonlabored on 2L [place] Abd: soft, NT/ND, +BS, no masses, hernias, or organomegaly MS: 3+ pitting edema BLE, calves soft and nontender Psych: A&Ox3 with an appropriate affect. Skin: warm and dry. About 4x6cm area of induration with central necrosis and surrounding cellulitis on left gluteal region, no active drainage; another area about 2x2cm of induration with white head, surrounding erythema, and no active drainage on left gluteal region  Results for orders placed or performed during the hospital encounter of 05/07/18 (from the past 48 hour(s))  Glucose, capillary     Status: Abnormal   Collection Time: 05/10/18  4:35 PM  Result Value Ref Range   Glucose-Capillary 204 (H) 70 - 99 mg/dL   Comment 1 Notify RN    Comment 2 Document in Chart   Glucose, capillary     Status: Abnormal   Collection Time: 05/10/18  9:20 PM  Result Value Ref Range   Glucose-Capillary 186 (H) 70 - 99 mg/dL  Glucose, capillary     Status: Abnormal   Collection Time: 05/11/18  6:11 AM  Result Value Ref Range   Glucose-Capillary 110 (H) 70 - 99 mg/dL  Basic metabolic panel     Status: Abnormal   Collection Time: 05/11/18  8:02 AM  Result Value Ref Range   Sodium 148 (H) 135 - 145 mmol/L   Potassium 5.4 (H) 3.5 - 5.1 mmol/L   Chloride 106 98 - 111 mmol/L   CO2 30 22 - 32 mmol/L   Glucose, Bld 94 70 - 99 mg/dL   BUN 84 (H) 8 - 23 mg/dL   Creatinine, Ser 1.44 (H) 0.44 - 1.00 mg/dL   Calcium 8.8 (L) 8.9 - 10.3 mg/dL   GFR calc non Af Amer 37 (L) >60 mL/min   GFR calc Af Amer 43 (L) >60 mL/min   Anion gap 12 5 - 15    Comment: Performed at  [facility], [place] [address] [place], [place] [address]  CBC     Status: Abnormal   Collection Time: 05/11/18  8:02 AM  Result Value Ref Range   WBC 8.3 4.0 - 10.5 K/uL   RBC 3.33 (L) 3.87 - 5.11 MIL/uL   Hemoglobin 9.7 (L) 12.0 - 15.0 g/dL   HCT 31.4 (L) 36.0 - 46.0 %   MCV 94.3 80.0 - 100.0 fL   MCH 29.1 26.0 - 34.0 pg    MCHC 30.9 30.0 - 36.0 g/dL   RDW 14.7 11.5 - 15.5 %   Platelets 123 (L) 150 - 400 K/uL   nRBC 0.0 0.0 - 0.2 %    Comment: Performed at [facility], [place] [address], [place], [place] [address]  Glucose, capillary     Status: Abnormal   Collection Time: 05/11/18 11:43 AM  Result Value Ref Range   Glucose-Capillary 110 (H) 70 - 99 mg/dL  Glucose, capillary     Status: Abnormal   Collection Time: 05/11/18  4:07 PM  Result Value Ref Range   Glucose-Capillary 147 (H) 70 - 99 mg/dL  Glucose, capillary     Status: Abnormal   Collection Time: 05/11/18  9:18 PM  Result Value Ref Range   Glucose-Capillary 182 (H) 70 - 99 mg/dL  Glucose, capillary     Status: None   Collection Time: 05/12/18  6:13 AM  Result Value Ref Range   Glucose-Capillary 90 70 - 99 mg/dL  Hemoglobin and hematocrit, blood     Status: Abnormal   Collection Time: 05/12/18 10:00 AM  Result Value Ref Range   Hemoglobin 9.0 (L) 12.0 - 15.0 g/dL   HCT 28.6 (L) 36.0 - 46.0 %    Comment: Performed at [facility], [place]. [address], [place], [place] [address]  Basic metabolic panel     Status: Abnormal   Collection Time: 05/12/18 10:00 AM  Result Value Ref Range   Sodium 145 135 - 145 mmol/L   Potassium 5.3 (H) 3.5 - 5.1 mmol/L   Chloride 103 98 - 111 mmol/L   CO2 33 (H) 22 - 32 mmol/L   Glucose, Bld 176 (H) 70 - 99 mg/dL   BUN 80 (H) 8 - 23 mg/dL   Creatinine, Ser 1.57 (H) 0.44 - 1.00 mg/dL   Calcium 8.8 (L) 8.9 - 10.3 mg/dL   GFR calc non Af Amer 34 (L) >60 mL/min   GFR calc Af Amer 39 (L) >60 mL/min   Anion gap 9 5 - 15    Comment: Performed at [facility] [address] [place], [place] [address]  Glucose, capillary     Status: Abnormal   Collection Time: 05/12/18 11:57 AM  Result Value Ref Range   Glucose-Capillary 126 (H) 70 - 99 mg/dL   [place] Soft Tissue Non Vascular  Result Date: 05/10/2018 CLINICAL DATA:  Indurated areas in left buttocks region. Evaluate for abscess.  EXAM: ULTRASOUND LEFT LOWER EXTREMITY LIMITED TECHNIQUE: Ultrasound examination of the lower extremity soft tissues was performed in the area of clinical concern. COMPARISON:  None. FINDINGS: Focused ultrasound exam was performed of the medial left buttock region, assessing the 2 more focal areas of redness/induration. The more inferior of the 2 areas shows subcutaneous edema with a more focal 1.0 x 1.2 cm apparent fluid collection without a well-defined border by ultrasound. The more cranial of the 2 areas also shows underlying edema without a discrete fluid  collection evident. IMPRESSION: Subcutaneous edema noted in the 2 areas of concern involving the medial left buttocks region. A small 12 mm fluid collection is identified deep to the more caudal of the 2 areas and may represent a focal area of confluent edema although superinfection of this fluid cannot be excluded by ultrasound. Electronically Signed   By: Misty Stanley M.D.   On: 05/10/2018 17:59    Anti-infectives (From admission, onward)   Start     Dose/Rate Route Frequency Ordered Stop   05/08/18 2100  amoxicillin-clavulanate (AUGMENTIN) 500-125 MG per tablet 500 mg  Status:  Discontinued     1 tablet Oral 2 times daily 05/08/18 1524 05/10/18 1546   05/07/18 2200  doxycycline (VIBRA-TABS) tablet 100 mg     100 mg Oral Every 12 hours 05/07/18 2009     05/07/18 2015  cefTRIAXone (ROCEPHIN) 1 g in sodium chloride 0.9 % 100 mL IVPB  Status:  Discontinued     1 g 200 mL/hr over 30 Minutes Intravenous Every 24 hours 05/07/18 2009 05/08/18 1524       Assessment/Plan Chronic diastolic CHF (EF 11-94% 1/74/0814) 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  Patient is stable and has eaten today so we will make her NPO after midnight and plan for surgery tomorrow.  ID - rocephin 4/16>>4/17, augmentin 4/17>>19, doxycycline 4/16>> VTE - SCDs, lovenox FEN - HH/CM diet, NPO after midnight Foley - none Follow up - TBD  [place], [facility] 05/12/2018, 2:17 PM Pager: [phone] Mon-Thurs 7:00 am-4:30 pm Fri 7:00 am -11:30 AM Sat-Sun 7:00 am-11:30 am

## 2018-05-13 ENCOUNTER — Encounter (HOSPITAL_COMMUNITY)
Admission: EM | Disposition: A | Discharge status: Nursing Home (Includes ICF) | Payer: Self-pay | Source: Home / Self Care | Attending: Internal Medicine

## 2018-05-13 ENCOUNTER — Inpatient Hospital Stay (HOSPITAL_COMMUNITY): Payer: Medicare Other | Admitting: Certified Registered Nurse Anesthetist

## 2018-05-13 ENCOUNTER — Encounter (HOSPITAL_COMMUNITY): Payer: Self-pay

## 2018-05-13 HISTORY — PX: PILONIDAL CYST EXCISION: SHX744

## 2018-05-13 LAB — BASIC METABOLIC PANEL
Anion gap: 9 (ref 5–15)
BUN: 78 mg/dL — ABNORMAL HIGH (ref 8–23)
CO2: 34 mmol/L — ABNORMAL HIGH (ref 22–32)
Calcium: 8.3 mg/dL — ABNORMAL LOW (ref 8.9–10.3)
Chloride: 104 mmol/L (ref 98–111)
Creatinine, Ser: 1.45 mg/dL — ABNORMAL HIGH (ref 0.44–1.00)
GFR calc Af Amer: 43 mL/min — ABNORMAL LOW (ref >60)
GFR calc non Af Amer: 37 mL/min — ABNORMAL LOW (ref >60)
Glucose, Bld: 110 mg/dL — ABNORMAL HIGH (ref 70–99)
Potassium: 5.1 mmol/L (ref 3.5–5.1)
Sodium: 147 mmol/L — ABNORMAL HIGH (ref 135–145)

## 2018-05-13 LAB — CBC WITH DIFFERENTIAL/PLATELET
Abs Immature Granulocytes: 0.02 10*3/uL (ref 0.00–0.07)
Basophils Absolute: 0 10*3/uL (ref 0.0–0.1)
Basophils Relative: 0 %
Eosinophils Absolute: 0.1 10*3/uL (ref 0.0–0.5)
Eosinophils Relative: 2 %
HCT: 24.7 % — ABNORMAL LOW (ref 36.0–46.0)
Hemoglobin: 7.8 g/dL — ABNORMAL LOW (ref 12.0–15.0)
Immature Granulocytes: 0 %
Lymphocytes Relative: 30 %
Lymphs Abs: 1.7 10*3/uL (ref 0.7–4.0)
MCH: 30.2 pg (ref 26.0–34.0)
MCHC: 31.6 g/dL (ref 30.0–36.0)
MCV: 95.7 fL (ref 80.0–100.0)
Monocytes Absolute: 0.6 10*3/uL (ref 0.1–1.0)
Monocytes Relative: 11 %
Neutro Abs: 3.2 10*3/uL (ref 1.7–7.7)
Neutrophils Relative %: 57 %
Platelets: 105 10*3/uL — ABNORMAL LOW (ref 150–400)
RBC: 2.58 MIL/uL — ABNORMAL LOW (ref 3.87–5.11)
RDW: 14.8 % (ref 11.5–15.5)
WBC: 5.6 10*3/uL (ref 4.0–10.5)
nRBC: 0 % (ref 0.0–0.2)

## 2018-05-13 LAB — GLUCOSE, CAPILLARY
Glucose-Capillary: 87 mg/dL (ref 70–99)
Glucose-Capillary: 89 mg/dL (ref 70–99)
Glucose-Capillary: 86 mg/dL (ref 70–99)
Glucose-Capillary: 90 mg/dL (ref 70–99)
Glucose-Capillary: 260 mg/dL — ABNORMAL HIGH (ref 70–99)
Glucose-Capillary: 281 mg/dL — ABNORMAL HIGH (ref 70–99)

## 2018-05-13 LAB — SURGICAL PCR SCREEN
MRSA, PCR: POSITIVE — AB
Staphylococcus aureus: POSITIVE — AB

## 2018-05-13 SURGERY — EXCISION, PILONIDAL CYST, EXTENSIVE
Anesthesia: General | Site: Rectum

## 2018-05-13 MED ORDER — DEXAMETHASONE SODIUM PHOSPHATE 10 MG/ML IJ SOLN
INTRAMUSCULAR | Status: AC
  Filled 2018-05-13: qty 1

## 2018-05-13 MED ORDER — CEFAZOLIN SODIUM-DEXTROSE 2-3 GM-%(50ML) IV SOLR
INTRAVENOUS | Status: DC | PRN
  Administered 2018-05-13: 2 g via INTRAVENOUS

## 2018-05-13 MED ORDER — MUPIROCIN 2 % EX OINT
1.0000 "application " | TOPICAL_OINTMENT | Freq: Once | CUTANEOUS | Status: AC
  Administered 2018-05-13: 1 via TOPICAL

## 2018-05-13 MED ORDER — FENTANYL CITRATE (PF) 100 MCG/2ML IJ SOLN: 25.0000 ug | INTRAMUSCULAR | Status: DC | PRN

## 2018-05-13 MED ORDER — BUPIVACAINE-EPINEPHRINE (PF) 0.25% -1:200000 IJ SOLN
INTRAMUSCULAR | Status: AC
  Filled 2018-05-13: qty 30

## 2018-05-13 MED ORDER — 0.9 % SODIUM CHLORIDE (POUR BTL) OPTIME
TOPICAL | Status: DC | PRN
  Administered 2018-05-13: 10:00:00 1000 mL

## 2018-05-13 MED ORDER — ADULT MULTIVITAMIN W/MINERALS CH
1.0000 | ORAL_TABLET | Freq: Every day | ORAL | Status: DC
  Administered 2018-05-14 – 2018-05-21 (×8): 1 via ORAL
  Filled 2018-05-13 (×9): qty 1

## 2018-05-13 MED ORDER — LIDOCAINE 2% (20 MG/ML) 5 ML SYRINGE
INTRAMUSCULAR | Status: DC | PRN
  Administered 2018-05-13: 100 mg via INTRAVENOUS

## 2018-05-13 MED ORDER — ROCURONIUM BROMIDE 10 MG/ML (PF) SYRINGE
PREFILLED_SYRINGE | INTRAVENOUS | Status: DC | PRN
  Administered 2018-05-13: 100 mg via INTRAVENOUS

## 2018-05-13 MED ORDER — MUPIROCIN 2 % EX OINT
TOPICAL_OINTMENT | CUTANEOUS | Status: AC
  Administered 2018-05-13: 1 via TOPICAL
  Filled 2018-05-13: qty 22

## 2018-05-13 MED ORDER — DEXAMETHASONE SODIUM PHOSPHATE 10 MG/ML IJ SOLN
INTRAMUSCULAR | Status: DC | PRN
  Administered 2018-05-13: 4 mg via INTRAVENOUS

## 2018-05-13 MED ORDER — HYDRALAZINE HCL 20 MG/ML IJ SOLN
10.0000 mg | Freq: Once | INTRAMUSCULAR | Status: AC
  Administered 2018-05-13: 10 mg via INTRAVENOUS
  Filled 2018-05-13: qty 0.5
  Filled 2018-05-13: qty 1

## 2018-05-13 MED ORDER — ROCURONIUM BROMIDE 50 MG/5ML IV SOSY
PREFILLED_SYRINGE | INTRAVENOUS | Status: AC
  Filled 2018-05-13: qty 5

## 2018-05-13 MED ORDER — PROPOFOL 10 MG/ML IV BOLUS
INTRAVENOUS | Status: AC
  Filled 2018-05-13: qty 20

## 2018-05-13 MED ORDER — SILVER NITRATE-POT NITRATE 75-25 % EX MISC
1.0000 "application " | Freq: Once | CUTANEOUS | Status: AC
  Administered 2018-05-13: 1 via TOPICAL
  Filled 2018-05-13: qty 1

## 2018-05-13 MED ORDER — SUGAMMADEX SODIUM 200 MG/2ML IV SOLN
INTRAVENOUS | Status: DC | PRN
  Administered 2018-05-13: 325.2 mg via INTRAVENOUS

## 2018-05-13 MED ORDER — BUPIVACAINE-EPINEPHRINE (PF) 0.25% -1:200000 IJ SOLN
INTRAMUSCULAR | Status: DC | PRN
  Administered 2018-05-13: 20 mL via PERINEURAL

## 2018-05-13 MED ORDER — ONDANSETRON HCL 4 MG/2ML IJ SOLN: 4.0000 mg | Freq: Once | INTRAMUSCULAR | Status: DC | PRN

## 2018-05-13 MED ORDER — CHLORHEXIDINE GLUCONATE CLOTH 2 % EX PADS
6.0000 | MEDICATED_PAD | Freq: Every day | CUTANEOUS | Status: DC
  Administered 2018-05-13 – 2018-05-20 (×5): 6 via TOPICAL

## 2018-05-13 MED ORDER — PROPOFOL 10 MG/ML IV BOLUS
INTRAVENOUS | Status: DC | PRN
  Administered 2018-05-13: 100 mg via INTRAVENOUS

## 2018-05-13 MED ORDER — LACTATED RINGERS IV SOLN
INTRAVENOUS | Status: DC
  Administered 2018-05-13: 09:00:00 via INTRAVENOUS

## 2018-05-13 MED ORDER — FENTANYL CITRATE (PF) 250 MCG/5ML IJ SOLN
INTRAMUSCULAR | Status: DC | PRN
  Administered 2018-05-13: 75 ug via INTRAVENOUS

## 2018-05-13 MED ORDER — ACETAMINOPHEN 10 MG/ML IV SOLN: 1000.0000 mg | Freq: Once | INTRAVENOUS | Status: DC | PRN

## 2018-05-13 MED ORDER — ONDANSETRON HCL 4 MG/2ML IJ SOLN
INTRAMUSCULAR | Status: AC
  Filled 2018-05-13: qty 2

## 2018-05-13 MED ORDER — JUVEN PO PACK
1.0000 | PACK | Freq: Two times a day (BID) | ORAL | Status: DC
  Administered 2018-05-14 – 2018-05-21 (×11): 1 via ORAL
  Filled 2018-05-13 (×17): qty 1

## 2018-05-13 MED ORDER — HYDROMORPHONE HCL 1 MG/ML IJ SOLN
1.0000 mg | INTRAMUSCULAR | Status: DC | PRN
  Administered 2018-05-13 – 2018-05-14 (×5): 1 mg via INTRAVENOUS
  Filled 2018-05-13 (×5): qty 1

## 2018-05-13 MED ORDER — LIDOCAINE 2% (20 MG/ML) 5 ML SYRINGE
INTRAMUSCULAR | Status: AC
  Filled 2018-05-13: qty 5

## 2018-05-13 MED ORDER — FENTANYL CITRATE (PF) 250 MCG/5ML IJ SOLN
INTRAMUSCULAR | Status: AC
  Filled 2018-05-13: qty 5

## 2018-05-13 MED ORDER — HYDROMORPHONE HCL 1 MG/ML IJ SOLN
1.0000 mg | INTRAMUSCULAR | Status: DC | PRN
  Administered 2018-05-13 (×2): 1 mg via INTRAVENOUS
  Filled 2018-05-13 (×2): qty 1

## 2018-05-13 MED ORDER — ONDANSETRON HCL 4 MG/2ML IJ SOLN
INTRAMUSCULAR | Status: DC | PRN
  Administered 2018-05-13: 4 mg via INTRAVENOUS

## 2018-05-13 SURGICAL SUPPLY — 33 items
BENZOIN TINCTURE PRP APPL 2/3 (GAUZE/BANDAGES/DRESSINGS) ×3 IMPLANT
BLADE CLIPPER SURG (BLADE) IMPLANT
CANISTER SUCT 3000ML PPV (MISCELLANEOUS) IMPLANT
COVER SURGICAL LIGHT HANDLE (MISCELLANEOUS) ×3 IMPLANT
COVER WAND RF STERILE (DRAPES) ×3 IMPLANT
DRAIN PENROSE 1/2X12 LTX STRL (WOUND CARE) ×3 IMPLANT
DRAPE LAPAROTOMY T 102X78X121 (DRAPES) ×3 IMPLANT
DRSG PAD ABDOMINAL 8X10 ST (GAUZE/BANDAGES/DRESSINGS) ×3 IMPLANT
ELECT CAUTERY BLADE 6.4 (BLADE) IMPLANT
ELECT REM PT RETURN 9FT ADLT (ELECTROSURGICAL) ×3
ELECTRODE REM PT RTRN 9FT ADLT (ELECTROSURGICAL) ×1 IMPLANT
GAUZE SPONGE 2X2 8PLY STRL LF (GAUZE/BANDAGES/DRESSINGS) ×1 IMPLANT
GAUZE SPONGE 4X4 12PLY STRL (GAUZE/BANDAGES/DRESSINGS) ×3 IMPLANT
GLOVE BIO SURGEON STRL SZ7 (GLOVE) ×3 IMPLANT
GLOVE BIOGEL PI IND STRL 7.5 (GLOVE) ×1 IMPLANT
GLOVE BIOGEL PI INDICATOR 7.5 (GLOVE) ×2
GOWN STRL REUS W/ TWL LRG LVL3 (GOWN DISPOSABLE) ×2 IMPLANT
GOWN STRL REUS W/TWL LRG LVL3 (GOWN DISPOSABLE) ×4
KIT BASIN OR (CUSTOM PROCEDURE TRAY) ×3 IMPLANT
KIT TURNOVER KIT B (KITS) ×3 IMPLANT
NEEDLE HYPO 25GX1X1/2 BEV (NEEDLE) ×3 IMPLANT
NS IRRIG 1000ML POUR BTL (IV SOLUTION) ×3 IMPLANT
PACK GENERAL/GYN (CUSTOM PROCEDURE TRAY) ×3 IMPLANT
PAD ARMBOARD 7.5X6 YLW CONV (MISCELLANEOUS) ×9 IMPLANT
PENCIL SMOKE EVACUATOR (MISCELLANEOUS) ×3 IMPLANT
SPECIMEN JAR MEDIUM (MISCELLANEOUS) ×3 IMPLANT
SPONGE GAUZE 2X2 STER 10/PKG (GAUZE/BANDAGES/DRESSINGS) ×2
SUT ETHILON 2 0 FS 18 (SUTURE) ×6 IMPLANT
SWAB COLLECTION DEVICE MRSA (MISCELLANEOUS) IMPLANT
SWAB CULTURE ESWAB REG 1ML (MISCELLANEOUS) IMPLANT
SYR CONTROL 10ML LL (SYRINGE) ×3 IMPLANT
TOWEL OR 17X24 6PK STRL BLUE (TOWEL DISPOSABLE) ×3 IMPLANT
TOWEL OR 17X26 10 PK STRL BLUE (TOWEL DISPOSABLE) ×3 IMPLANT

## 2018-05-13 NOTE — Progress Notes (Addendum)
Occupational Therapy Treatment Patient Details Name: Andrea [name] MRN: [id number] DOB: 02-28-1950 Today's Date: 05/13/2018    History of present illness 68 yo admitted with SOB and weakness from home after leaving SNF 4/13. Pt with CHF and PNA. Pt D/C from hospital to [facility] on 4/5. PMhx: CHF, CAD, breast CA, bil mastectomy, HTn, seizure, migraine, IDDM, CKD. NOw s/p I&D of Left Buttocks   OT comments  Pt progressing towards OT goals this session. Pt is very sore from I&D earlier today but agreeable to HEP for BUE as well as BSC transfer. Pt able to complete everything as stated below. Pt continues to be motivated and currently POC remains appropriate.   Theraband hung in closet to ensure it would not get lost again.   Follow Up Recommendations  SNF;Home health OT    Equipment Recommendations  None recommended by OT(PT has appropriate DME)    Recommendations for Other Services      Precautions / Restrictions Precautions Precautions: Fall Precaution Comments: watch 02 Restrictions Weight Bearing Restrictions: No       Mobility Bed Mobility Overal bed mobility: Modified Independent             General bed mobility comments: inceased time and effort, but no assist needed  Transfers Overall transfer level: Needs assistance Equipment used: None Transfers: Sit to/from Stand;Stand Pivot Transfers Sit to Stand: Min guard Stand pivot transfers: Min guard       General transfer comment: min guard for safety without RW for transfer to [facility]    Balance Overall balance assessment: Mild deficits observed, not formally tested                                         ADL either performed or assessed with clinical judgement   ADL                           Toilet Transfer: Min Statistician Details (indicate cue type and reason): no physical assist needed, Pt able to manage O2 line Toileting- Clothing Manipulation and  Hygiene: Supervision/safety;Sit to/from stand       Functional mobility during ADLs: Min guard;Rolling walker(SPT only today)       Vision       Perception     Praxis      Cognition Arousal/Alertness: Awake/alert Behavior During Therapy: WFL for tasks assessed/performed Overall Cognitive Status: No family/caregiver present to determine baseline cognitive functioning Area of Impairment: Safety/judgement;Awareness;Memory                     Memory: Decreased short-term memory   Safety/Judgement: Decreased awareness of safety Awareness: Emergent            Exercises Exercises: General Upper Extremity General Exercises - Upper Extremity Shoulder Flexion: Strengthening;Right;Left;10 reps;Seated;Theraband Theraband Level (Shoulder Flexion): Level 2 (Red) Shoulder Horizontal ABduction: Strengthening;Both;10 reps;Seated;Theraband Theraband Level (Shoulder Horizontal Abduction): Level 2 (Red) Elbow Flexion: Strengthening;Both;10 reps;Seated;Theraband Theraband Level (Elbow Flexion): Level 2 (Red) Chair Push Up: (deferred today due to recent surgery)   Shoulder Instructions       General Comments      Pertinent Vitals/ Pain       Pain Assessment: 0-10 Faces Pain Scale: Hurts whole lot Pain Location: Buttocks, site of I&D Pain Descriptors / Indicators: Sore;Aching;Discomfort;Grimacing;Guarding Pain Intervention(s): Limited activity within patient's tolerance;Repositioned  Home Living  Prior Functioning/Environment              Frequency  Min 3X/week        Progress Toward Goals  OT Goals(current goals can now be found in the care plan section)  Progress towards OT goals: Progressing toward goals  Acute Rehab OT Goals Patient Stated Goal: get stronger and go home OT Goal Formulation: With patient Time For Goal Achievement: 05/22/18 Potential to Achieve Goals: Good  Plan Discharge  plan remains appropriate;Frequency remains appropriate    Co-evaluation                 AM-PAC OT "6 Clicks" Daily Activity     Outcome Measure   Help from another person eating meals?: None Help from another person taking care of personal grooming?: A Little Help from another person toileting, which includes using toliet, bedpan, or urinal?: A Little Help from another person bathing (including washing, rinsing, drying)?: A Little Help from another person to put on and taking off regular upper body clothing?: None Help from another person to put on and taking off regular lower body clothing?: A Little 6 Click Score: 20    End of Session Equipment Utilized During Treatment: Oxygen(2L O2)  OT Visit Diagnosis: Muscle weakness (generalized) (M62.81);Adult, failure to thrive (R62.7)   Activity Tolerance Patient tolerated treatment well   Patient Left in bed;with call bell/phone within reach   Nurse Communication Mobility status;Patient requests pain meds        Time: 1287-8676 OT Time Calculation (min): 20 min  Charges: OT General Charges $OT Visit: 1 Visit OT Treatments $Therapeutic Exercise: 8-22 mins Hulda Humphrey OTR/L Acute Rehabilitation Services Pager: [phone] Office: [place] 05/13/2018, 6:22 PM

## 2018-05-13 NOTE — Op Note (Signed)
Preop diagnosis: Subcutaneous abscesses x2 left buttock Postop diagnosis: Subcutaneous abscess (4 x 3 x 3 cm and 2 x 2 x 2 cm) left buttock Procedure performed: Incision, drainage, and sharp debridement of 2 subcutaneous abscesses of the left buttock. Surgeon:Graceyn Fodor K Rudi Bunyard Anesthesia: General Indications: This is a 68 year old female with multiple chronic medical comorbidities including congestive heart failure, coronary artery disease, diabetes, hypertension, chronic kidney disease, and a history of breast cancer who was admitted with respiratory failure.  She began also complaining of some buttock pain.  She is on antibiotics.  However the pain has become worse.  We were consulted yesterday evening for evaluation.  She is found to have 1 large subcutaneous abscess and a smaller adjacent abscess.  She presents now for debridement.  Description of procedure: The patient is brought to the operating room and placed in the supine position on her stretcher.  After an adequate level of general anesthesia was obtained, she was moved to a prone position on the operating room table.  Her buttocks were prepped with Betadine and draped in sterile fashion.  A timeout was taken to ensure the proper patient and proper procedure.  The patient has a 4 x 6 cm area of induration in the mid left buttock with a 2 cm area of central necrosis.  Medial and superior to this area there is a 2 cm area of induration with obvious fluctuance.  We infiltrated around both abscesses with local anesthetic.  I excised the skin and subcutaneous tissue over the smaller abscess.  We encountered a very small amount of purulent fluid.  We debrided this wound thoroughly.  I then excised a 4 cm area of skin and subcutaneous tissue overlying the larger abscess.  We entered a pocket of purulent fluid that tracked medially.  This did not seem to involve the rectum.  We cultured the purulent fluid and sent for microbiology.  We debrided the wound  thoroughly and sent all the tissue for pathology.  We inspected for hemostasis.  Both wounds were thoroughly irrigated.  I placed a Penrose drain deep into the larger abscess and secured with 2 sutures of 2-0 nylon.  Moist gauze was packed in the superficial parts of both wounds.  Dry dressings were applied.  The patient was then moved back to supine position and was extubated brought to the recovery room in stable condition.  All sponge, instrument, and needle counts are correct.  Imogene Burn. Georgette Dover, MD, [facility]  General/ Trauma Surgery Beeper [phone]  05/13/2018 10:47 AM

## 2018-05-13 NOTE — Progress Notes (Signed)
   Subjective/Chief Complaint: Patient still with left buttock pain, requiring IV pain meds   Objective: Vital signs in last 24 hours: Temp:  [98.1 F (36.7 C)-98.8 F (37.1 C)] 98.1 F (36.7 C) (04/22 0737) Pulse Rate:  [78-90] 85 (04/22 0737) Resp:  [16-20] 17 (04/22 0737) BP: (133-181)/(56-76) 181/72 (04/22 0737) SpO2:  [92 %-99 %] 98 % (04/22 0737) Weight:  [81.3 kg] 81.3 kg (04/22 0254) Last BM Date: 05/12/18  Intake/Output from previous day: 04/21 0701 - 04/22 0700 In: 5701 [P.O.:1240; I.V.:6] Out: 1250 [Urine:1250] Intake/Output this shift: No intake/output data recorded.  Left gluteal induration with central necrosis and cellulitis Medial left 2x2 cm induration and erythema  Lab Results:  Recent Labs    05/11/18 0802 05/12/18 1000 05/13/18 0427  WBC 8.3  --  5.6  HGB 9.7* 9.0* 7.8*  HCT 31.4* 28.6* 24.7*  PLT 123*  --  105*   BMET Recent Labs    05/12/18 1000 05/13/18 0427  NA 145 147*  K 5.3* 5.1  CL 103 104  CO2 33* 34*  GLUCOSE 176* 110*  BUN 80* 78*  CREATININE 1.57* 1.45*  CALCIUM 8.8* 8.3*   PT/INR No results for input(s): LABPROT, INR in the last 72 hours. ABG No results for input(s): PHART, HCO3 in the last 72 hours.  Invalid input(s): PCO2, PO2  Studies/Results: No results found.  Anti-infectives: Anti-infectives (From admission, onward)   Start     Dose/Rate Route Frequency Ordered Stop   05/08/18 2100  amoxicillin-clavulanate (AUGMENTIN) 500-125 MG per tablet 500 mg  Status:  Discontinued     1 tablet Oral 2 times daily 05/08/18 1524 05/10/18 1546   05/07/18 2200  doxycycline (VIBRA-TABS) tablet 100 mg     100 mg Oral Every 12 hours 05/07/18 2009     05/07/18 2015  cefTRIAXone (ROCEPHIN) 1 g in sodium chloride 0.9 % 100 mL IVPB  Status:  Discontinued     1 g 200 mL/hr over 30 Minutes Intravenous Every 24 hours 05/07/18 2009 05/08/18 1524      Assessment/Plan: Chronic diastolic CHF (EF 77-93% 09/24/90) 3VCAD s/p PCI  of proximal and distal LAD w/ DES x 2 (12/30/2017) on plavix IDDM HTN Hypothyroidism CKD III/IV Seizure disorder HLD Generalized anxiety disorder Chronic back H/o breast cancer s/p bilateral mastectomy followed by chemotherapy 2014 at [facility]  Left gluteal abscess - Recommend I&D left gluteal abscess in the OR today.  The surgical procedure has been discussed with the patient.  Potential risks, benefits, alternative treatments, and expected outcomes have been explained.  All of the patient's questions at this time have been answered.  The likelihood of reaching the patient's treatment goal is good.  The patient understand the proposed surgical procedure and wishes to proceed.   ID - rocephin 4/16>>4/17, augmentin 4/17>>19, doxycycline 4/16>> VTE - SCDs, lovenox FEN -  NPO now, resume HH carb-modified diet post-op Foley - none Follow up - TBD  LOS: 6 days    Andrea [name] 05/13/2018

## 2018-05-13 NOTE — Progress Notes (Signed)
Pt had I and D today. Pt has bled through two dressing changes. MD notified. Will continue to monitor. Awaiting orders

## 2018-05-13 NOTE — Transfer of Care (Signed)
Immediate Anesthesia Transfer of Care Note  Patient: Andrea [name]  Procedure(s) Performed: IRRIGATION AND DEBRIDEMENT  OF TWO GLUTEAL ABSCESSES (N/A Rectum)  Patient Location: PACU  Anesthesia Type:General  Level of Consciousness: awake  Airway & Oxygen Therapy: Patient Spontanous Breathing and Patient connected to nasal cannula oxygen  Post-op Assessment: Report given to RN and Post -op Vital signs reviewed and stable  Post vital signs: Reviewed and stable  Last Vitals:  Vitals Value Taken Time  BP 133/57 05/13/2018 11:09 AM  Temp    Pulse 69 05/13/2018 11:13 AM  Resp 13 05/13/2018 11:13 AM  SpO2 97 % 05/13/2018 11:13 AM  Vitals shown include unvalidated device data.  Last Pain:  Vitals:   05/13/18 0737  TempSrc: Oral  PainSc:       Patients Stated Pain Goal: 1 (16/10/96 0454)  Complications: No apparent anesthesia complications

## 2018-05-13 NOTE — Progress Notes (Signed)
PROGRESS NOTE    Andrea [name]  KVQ:[id number] DOB: 05-03-1950 DOA: 05/07/2018 PCP: Medicine, [facility]   Brief Narrative:  Patient is a 68 year old female past medical history of diastolic CHF, three-vessel CAD status post PCI, diabetes mellitus and stage III-4 CKD who presented to the emergency room on 4/16 with complaints of shortness of breath as well as weakness, unable to pick herself up off the floor.  She had been just discharged 10 days prior for a multi lobar pneumonia from RSV plus CHF exacerbation.  She had just left her skilled nursing facility after discharge 3 days before coming in to the back to the emergency room.  COVID ruled out.  Patient found to be in significant volume overload with 30 pound weight gain.  Also complained of right buttock pain where she is found to have an area of induration with fluctuance.    Assessment & Plan:   Principal Problem:   Acute on chronic diastolic congestive heart failure (HCC) Active Problems:   Essential hypertension   Insulin dependent diabetes mellitus (HCC)   Hypothyroidism   History of seizures   Chronic pain   CAD S/P percutaneous coronary angioplasty   Seizure (HCC)   Anemia   GAD (generalized anxiety disorder)   Hyperkalemia   CKD (chronic kidney disease), stage III (HCC)   Bilateral leg edema   FTT (failure to thrive) in adult   Pressure injury of skin   Cellulitis and abscess of buttock  acute on chronic diastolic heart failure Questionable etiology.  Patient denies any dietary indiscretions.  Patient does endorse compliance with all her medications.  - 30 pound weight gain over the course of 3 weeks and noted to have 3-4+ bilateral pitting edema.   -Lower extremity Dopplers negative for DVT.   - Continue current dose of Lasix 80 mg IV every 12 hours.  Continue Lipitor, Coreg, Plavix,imdur, hydralazine.  Strict I's and O's.  Daily weights.  Left buttocks cellulitis and abscess, POA -  Ultrasound of left gluteus was obtained subcutaneous edema noted in 2 areas of concern involving the medial left buttocks region.   -oral doxycycline which we will continue for now -s/p I/d-- cultures sent  Hyperkalemia - Patient on IV Lasix.   Bilateral lung infiltrates Patient noted to have negative COVID-19 testing.   -RSV was positive on respiratory viral panel on 04/27/2018 and was also positive on 04/11/2018.  Concern for ongoing RSV viral shedding.    Coronary artery disease Stable.  Patient denies any chest pain.  Continue cardiac medications of Lipitor, Plavix, Coreg, Imdur, hydralazine.  Will need outpatient follow-up with cardiology.  Insulin-dependent diabetes mellitus type 2 Hemoglobin A1c was 6.4 on 03/15/2018.  CBG of 90 this morning.  Continue current regimen of Lantus and sliding scale insulin.   Hypothyroidism Continue home dose Synthroid.  Outpatient follow-up.   Hypertension Systolic blood pressures noted to be in the 150s to the 170s.  Continue Coreg, Imdur.  Increase hydralazine back to home dose of 100 mg 3 times daily.  Follow.  Hypernatremia -Na still high  Seizure disorder Stable.  Continue Depakote.   Anxiety disorder BuSpar.   Chronic pain Continue current regimen of pain medication.   History of breast cancer    Debility Patient declines returning back to skilled nursing facility.  Will need to go home with home health on discharge.  Anemia of chronic kidney disease stage III Renal function improving.  Creatinine  2.37 on admission.  -s/p 1 unit PRBC -  may need another unit in AM    DVT prophylaxis: Lovenox Code Status: Full Family Communication: Updated patient.  No family at bedside. Disposition Plan: Patient wants to be discharged home with home health.  Patient does not want to go back to skilled nursing facility.   Consultants:   General surgery   Procedures:   CT chest 05/07/2018  CXR 05/07/2018  Lower extremity Dopplers  05/09/2018  [place] Left buttocks 05/10/2018  Antimicrobials:   Augmentin 05/08/2018>>>> 05/10/2018  Oral doxycycline 05/07/2018>>>>  IV Rocephin 05/07/2018>>>> 05/08/2018    Subjective: Hungry-- just back from I/D  Objective: Vitals:   05/13/18 1125 05/13/18 1130 05/13/18 1140 05/13/18 1145  BP: (!) 122/50  (!) 122/46   Pulse: 67 67 69 67  Resp: 10 16 14 16   Temp:      TempSrc:      SpO2: 97% 97% 95% 96%  Weight:      Height:        Intake/Output Summary (Last 24 hours) at 05/13/2018 1226 Last data filed at 05/13/2018 1113 Gross per 24 hour  Intake 1263 ml  Output 1420 ml  Net -157 ml   Filed Weights   05/12/18 0604 05/13/18 0254 05/13/18 0914  Weight: 83.9 kg 81.3 kg 81.3 kg    Examination:  General exam: chronically ill appearing Respiratory system: diminished, no wheezing Cardiovascular system: rrr. +LE edema, edema of right UE Gastrointestinal system: +Bs, soft, NT Central nervous system: A+Ox3     Data Reviewed: I have personally reviewed following labs and imaging studies  CBC: Recent Labs  Lab 05/07/18 1432 05/08/18 1002 05/09/18 0602 05/10/18 0505 05/11/18 0802 05/12/18 1000 05/13/18 0427  WBC 6.9 8.3 6.9 6.2 8.3  --  5.6  NEUTROABS 5.3  --  4.5 4.2  --   --  3.2  HGB 7.8* 9.6* 8.6* 8.7* 9.7* 9.0* 7.8*  HCT 26.1* 30.2* 26.3* 27.8* 31.4* 28.6* 24.7*  MCV 94.6 92.9 92.9 94.6 94.3  --  95.7  PLT 118* 129* 109* 97* 123*  --  256*   Basic Metabolic Panel: Recent Labs  Lab 05/09/18 0602 05/10/18 0505 05/11/18 0802 05/12/18 1000 05/13/18 0427  NA 145 143 148* 145 147*  K 5.3* 5.1 5.4* 5.3* 5.1  CL 103 102 106 103 104  CO2 28 30 30  33* 34*  GLUCOSE 94 187* 94 176* 110*  BUN 110* 98* 84* 80* 78*  CREATININE 1.86* 1.52* 1.44* 1.57* 1.45*  CALCIUM 8.6* 8.3* 8.8* 8.8* 8.3*   GFR: Estimated Creatinine Clearance: 38.8 mL/min (A) (by C-G formula based on SCr of 1.45 mg/dL (H)). Liver Function Tests: Recent Labs  Lab 05/07/18 1432  AST 47*   ALT 30  ALKPHOS 51  BILITOT 0.9  PROT 5.7*  ALBUMIN 2.8*   Recent Labs  Lab 05/07/18 1432  LIPASE 32   No results for input(s): AMMONIA in the last 168 hours. Coagulation Profile: No results for input(s): INR, PROTIME in the last 168 hours. Cardiac Enzymes: Recent Labs  Lab 05/07/18 1630  TROPONINI 0.04*   BNP (last 3 results) No results for input(s): PROBNP in the last 8760 hours. HbA1C: No results for input(s): HGBA1C in the last 72 hours. CBG: Recent Labs  Lab 05/12/18 1615 05/12/18 2108 05/13/18 0855 05/13/18 1113 05/13/18 1201  GLUCAP 126* 102* 89 86 90   Lipid Profile: No results for input(s): CHOL, HDL, LDLCALC, TRIG, CHOLHDL, LDLDIRECT in the last 72 hours. Thyroid Function Tests: No results for input(s): TSH, T4TOTAL, FREET4, T3FREE, THYROIDAB  in the last 72 hours. Anemia Panel: No results for input(s): VITAMINB12, FOLATE, FERRITIN, TIBC, IRON, RETICCTPCT in the last 72 hours. Sepsis Labs: Recent Labs  Lab 05/08/18 0020  PROCALCITON 0.11    Recent Results (from the past 240 hour(s))  SARS Coronavirus 2 [facility] order, Performed in [facility] hospital lab)     Status: None   Collection Time: 05/07/18  3:58 PM  Result Value Ref Range Status   SARS Coronavirus 2 NEGATIVE NEGATIVE Final    Comment: (NOTE) If result is NEGATIVE SARS-CoV-2 target nucleic acids are NOT DETECTED. The SARS-CoV-2 RNA is generally detectable in upper and lower  respiratory specimens during the acute phase of infection. The lowest  concentration of SARS-CoV-2 viral copies this assay can detect is 250  copies / mL. A negative result does not preclude SARS-CoV-2 infection  and should not be used as the sole basis for treatment or other  patient management decisions.  A negative result may occur with  improper specimen collection / handling, submission of specimen other  than nasopharyngeal swab, presence of viral mutation(s) within the  areas targeted by this assay, and  inadequate number of viral copies  (<250 copies / mL). A negative result must be combined with clinical  observations, patient history, and epidemiological information. If result is POSITIVE SARS-CoV-2 target nucleic acids are DETECTED. The SARS-CoV-2 RNA is generally detectable in upper and lower  respiratory specimens dur ing the acute phase of infection.  Positive  results are indicative of active infection with SARS-CoV-2.  Clinical  correlation with patient history and other diagnostic information is  necessary to determine patient infection status.  Positive results do  not rule out bacterial infection or co-infection with other viruses. If result is PRESUMPTIVE POSTIVE SARS-CoV-2 nucleic acids MAY BE PRESENT.   A presumptive positive result was obtained on the submitted specimen  and confirmed on repeat testing.  While 2019 novel coronavirus  (SARS-CoV-2) nucleic acids may be present in the submitted sample  additional confirmatory testing may be necessary for epidemiological  and / or clinical management purposes  to differentiate between  SARS-CoV-2 and other Sarbecovirus currently known to infect humans.  If clinically indicated additional testing with an alternate test  methodology [phone]) is advised. The SARS-CoV-2 RNA is generally  detectable in upper and lower respiratory sp ecimens during the acute  phase of infection. The expected result is Negative. Fact Sheet for Patients:  StrictlyIdeas.no Fact Sheet for Healthcare Providers: BankingDealers.co.za This test is not yet approved or cleared by the [place] FDA and has been authorized for detection and/or diagnosis of SARS-CoV-2 by FDA under an Emergency Use Authorization (EUA).  This EUA will remain in effect (meaning this test can be used) for the duration of the COVID-19 declaration under Section 564(b)(1) of the Act, 21 U.S.C. section 360bbb-3(b)(1), unless the  authorization is terminated or revoked sooner. Performed at [facility], [place] [address], [place], [place] [address]   Respiratory Panel by PCR     Status: Abnormal   Collection Time: 05/07/18  3:58 PM  Result Value Ref Range Status   Adenovirus NOT DETECTED NOT DETECTED Final   Coronavirus 229E NOT DETECTED NOT DETECTED Final    Comment: (NOTE) The Coronavirus on the Respiratory Panel, DOES NOT test for the novel  Coronavirus (2019 nCoV)    Coronavirus HKU1 NOT DETECTED NOT DETECTED Final   Coronavirus NL63 NOT DETECTED NOT DETECTED Final   Coronavirus OC43 NOT DETECTED NOT DETECTED Final  Metapneumovirus NOT DETECTED NOT DETECTED Final   Rhinovirus / Enterovirus NOT DETECTED NOT DETECTED Final   Influenza A NOT DETECTED NOT DETECTED Final   Influenza B NOT DETECTED NOT DETECTED Final   Parainfluenza Virus 1 NOT DETECTED NOT DETECTED Final   Parainfluenza Virus 2 NOT DETECTED NOT DETECTED Final   Parainfluenza Virus 3 NOT DETECTED NOT DETECTED Final   Parainfluenza Virus 4 NOT DETECTED NOT DETECTED Final   Respiratory Syncytial Virus DETECTED (A) NOT DETECTED Final    Comment: CRITICAL RESULT CALLED TO, READ BACK BY AND VERIFIED WITH: A.ROBERTS,RN 0457 05/08/18 G.MCADOO    Bordetella pertussis NOT DETECTED NOT DETECTED Final   Chlamydophila pneumoniae NOT DETECTED NOT DETECTED Final   Mycoplasma pneumoniae NOT DETECTED NOT DETECTED Final    Comment: Performed at [facility], [place] [address] [place], [place] [address]  MRSA PCR Screening     Status: Abnormal   Collection Time: 05/08/18  5:58 AM  Result Value Ref Range Status   MRSA by PCR POSITIVE (A) NEGATIVE Final    Comment:        The GeneXpert MRSA Assay (FDA approved for NASAL specimens only), is one component of a comprehensive MRSA colonization surveillance program. It is not intended to diagnose MRSA infection nor to guide or monitor treatment for MRSA infections. RESULT CALLED TO, READ BACK BY AND  VERIFIED WITH: Lorin Mercy RN, AT [phone] 05/08/18 BY Rush Landmark Performed at [facility], [place] [address] [place], [place] [address]   Expectorated sputum assessment w rflx to resp cult     Status: None   Collection Time: 05/09/18  5:34 AM  Result Value Ref Range Status   Specimen Description SPUTUM  Final   Special Requests NONE  Final   Sputum evaluation   Final    Sputum specimen not acceptable for testing.  Please recollect.   RESULT CALLED TO, READ BACK BY AND VERIFIED WITH: RN Madilyn Fireman [phone] FCP Performed at [place] [address], [place], [place] [address]    Report Status 05/09/2018 FINAL  Final  Surgical pcr screen     Status: Abnormal   Collection Time: 05/13/18  4:53 AM  Result Value Ref Range Status   MRSA, PCR POSITIVE (A) NEGATIVE Final   Staphylococcus aureus POSITIVE (A) NEGATIVE Final    Comment: CRITICAL RESULT CALLED TO, READ BACK BY AND VERIFIED WITH: H.HAMLIN AT 0711 ON [id number] BY SJW (NOTE) The Xpert SA Assay (FDA approved for NASAL specimens in patients 7 years of age and older), is one component of a comprehensive surveillance program. It is not intended to diagnose infection nor to guide or monitor treatment. Performed at [facility], [place] [address], [place], [place] [address]   Aerobic/Anaerobic Culture (surgical/deep wound)     Status: None (Preliminary result)   Collection Time: 05/13/18 10:27 AM  Result Value Ref Range Status   Specimen Description ABSCESS GLUTEAL  Final   Special Requests PATIENT ON FOLLOWING ANCEF  Final   Gram Stain   Final    RARE WBC PRESENT, PREDOMINANTLY PMN NO ORGANISMS SEEN Performed at [facility], [place] [address] [place], [place] [address]    Culture PENDING  Incomplete   Report Status PENDING  Incomplete         Radiology Studies: No results found.      Scheduled Meds: . arformoterol  15 mcg Nebulization BID  . atorvastatin  80 mg Oral QHS  .  budesonide  0.5 mg  Nebulization BID  . busPIRone  15 mg Oral BID  . carvedilol  25 mg Oral BID WC  . Chlorhexidine Gluconate Cloth  6 each Topical Q0600  . clopidogrel  75 mg Oral Daily  . divalproex  1,000 mg Oral Daily  . doxycycline  100 mg Oral Q12H  . enoxaparin (LOVENOX) injection  30 mg Subcutaneous Q24H  . folic acid  1 mg Oral Daily  . furosemide  80 mg Intravenous BID  . guaiFENesin  1,200 mg Oral BID  . hydrALAZINE  100 mg Oral Q8H  . insulin aspart  0-9 Units Subcutaneous TID WC  . insulin glargine  8 Units Subcutaneous QHS  . ipratropium-albuterol  3 mL Nebulization BID  . isosorbide mononitrate  30 mg Oral Daily  . levothyroxine  200 mcg Oral QAC breakfast  . Melatonin  3 mg Oral QHS  . morphine  15 mg Oral BID  . pantoprazole  40 mg Oral Daily  . polyethylene glycol  17 g Oral Daily  . pregabalin  75 mg Oral BID  . senna-docusate  2 tablet Oral BID  . sodium chloride flush  3 mL Intravenous Q12H   Continuous Infusions: . sodium chloride Stopped (05/08/18 0231)     LOS: 6 days    Time spent: 40 minutes    Geradine Girt, DO [facility]  If 7PM-7AM, please contact night-coverage [email or web address] 05/13/2018, 12:26 PM

## 2018-05-13 NOTE — Anesthesia Preprocedure Evaluation (Addendum)
Anesthesia Evaluation  Patient identified by MRN, date of birth, ID band Patient awake    Reviewed: Allergy & Precautions, NPO status , Patient's Chart, lab work & pertinent test results  Airway Mallampati: II  TM Distance: >3 FB Neck ROM: Full    Dental no notable dental hx. (+) Teeth Intact, Dental Advisory Given   Pulmonary shortness of breath and Long-Term Oxygen Therapy, pneumonia,    Pulmonary exam normal breath sounds clear to auscultation       Cardiovascular Exercise Tolerance: Poor hypertension, Pt. on medications and Pt. on home beta blockers + CAD and +CHF  Normal cardiovascular exam Rhythm:Regular Rate:Normal  03/05/2018 TTE  1. The left ventricle has has normal systolic function, with an ejection fraction of 55-60%. There is mildly increased left ventricular wall thickness.  2. The right ventricle has normal systolic function. The cavity was normal.   Neuro/Psych  Headaches,    GI/Hepatic Neg liver ROS,   Endo/Other  diabetesHypothyroidism   Renal/GU      Musculoskeletal   Abdominal   Peds  Hematology  (+) anemia ,   Anesthesia Other Findings   Reproductive/Obstetrics                            Lab Results  Component Value Date   CREATININE 1.45 (H) 05/13/2018   BUN 78 (H) 05/13/2018   NA 147 (H) 05/13/2018   K 5.1 05/13/2018   CL 104 05/13/2018   CO2 34 (H) 05/13/2018    Lab Results  Component Value Date   WBC 5.6 05/13/2018   HGB 7.8 (L) 05/13/2018   HCT 24.7 (L) 05/13/2018   MCV 95.7 05/13/2018   PLT 105 (L) 05/13/2018    Anesthesia Physical Anesthesia Plan  ASA: IV  Anesthesia Plan: General   Post-op Pain Management:    Induction: Intravenous, Cricoid pressure planned and Rapid sequence  PONV Risk Score and Plan: 3 and Treatment may vary due to age or medical condition, Dexamethasone, Ondansetron and Midazolam  Airway Management Planned: Oral  ETT  Additional Equipment:   Intra-op Plan:   Post-operative Plan: Extubation in OR  Informed Consent: I have reviewed the patients History and Physical, chart, labs and discussed the procedure including the risks, benefits and alternatives for the proposed anesthesia with the patient or authorized representative who has indicated his/her understanding and acceptance.     Dental advisory given  Plan Discussed with: CRNA  Anesthesia Plan Comments:        Anesthesia Quick Evaluation

## 2018-05-13 NOTE — Progress Notes (Signed)
Initial Nutrition Assessment  DOCUMENTATION CODES:   Not applicable  INTERVENTION:   -1 packet Juven BID, each packet provides 90 calories, 2.5 grams of protein, 8 grams of carbohydrate, and 14 grams of amino acids; supplement contains CaHMB, glutamine, and arginine, to promote wound healing -MVI with minerals daily  NUTRITION DIAGNOSIS:   Increased nutrient needs related to wound healing as evidenced by estimated needs.  GOAL:   Patient will meet greater than or equal to 90% of their needs  MONITOR:   PO intake, Supplement acceptance, Labs, Weight trends, Skin, I & O's  REASON FOR ASSESSMENT:   LOS    ASSESSMENT:   Andrea [name] is a 68 y.o. 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  Pt admitted with acute on chronic CHF and multifocal pneumonia.  4/19- two large abscesses on left buttock noted by RN 4/22- s/p Procedure performed: Incision, drainage, and sharp debridement of 2 subcutaneous abscesses of the left buttock.  Reviewed I/O's: -4 ml x 24 hours and -1.5 L since admission  UOP: 1.3 L x 24 hours  Pt familiar to this RD from multiple prior admissions.   Case discussed with RN, who was eager for RD input for wound healing. She reports that pt's appetite has improved since prior admission. Per RN, pt just returned from surgery and already ate over 50% of meal provided. Noted meal completion 50-100%, which is a great improvement over prior admissions.   Noted no wt loss, however, pt with weigh gain likely related to edema.   Pt was drowsy and had just returned from surgery prior to visit. No fat or  muscle depletions noticed by this RD. In an effort to preserve PPE, RD did not enter the room to examine pt. Discussed nutrition care plan with this RN.   Lab Results  Component Value Date   HGBA1C 6.4 (H) 03/15/2018   PTA DM medications are 10 units insulin glargine daily.   Labs reviewed: CBGS: 86-89 (inpatient orders for glycemic control are 0-9 units insulin aspart TID with meals and 8 units insulin glargine q HS).   Labs reviewed.   NUTRITION - FOCUSED PHYSICAL EXAM:    Most Recent Value  Orbital Region  No depletion  Upper Arm Region  No depletion  Thoracic and Lumbar Region  No depletion  Buccal Region  No depletion  Temple Region  No depletion  Clavicle Bone Region  No depletion  Clavicle and Acromion Bone Region  No depletion  Scapular Bone Region  No depletion  Dorsal Hand  No depletion  Patellar Region  No depletion  Anterior Thigh Region  No depletion  Posterior Calf Region  No depletion  Edema (RD Assessment)  Mild  Hair  Reviewed  Eyes  Reviewed  Mouth  Reviewed  Skin  Reviewed  Nails  Reviewed       Diet Order:   Diet Order            Diet Carb Modified Fluid consistency: Thin; Room service appropriate? Yes  Diet effective now              EDUCATION NEEDS:   No education needs have been identified at this time  Skin:  Skin Assessment: Skin Integrity Issues: Skin Integrity Issues:: Unstageable Unstageable: buttocks x2  Last BM:  05/12/18  Height:   Ht Readings from Last 1 Encounters:  05/13/18 5\' 4"  (1.626 m)    Weight:   Wt Readings from Last 1 Encounters:  05/13/18 81.3 kg    Ideal Body Weight:  54.5 kg  BMI:  Body mass index is 30.76 kg/m.  Estimated Nutritional Needs:   Kcal:  1700-1900  Protein:  95-110 grams  Fluid:  > 1.7 L    Graceann Boileau A. Jimmye Norman, RD, LDN, [place] Registered Dietitian II Certified Diabetes Care and Education Specialist Pager: [phone] After hours Pager: [phone]

## 2018-05-13 NOTE — Progress Notes (Signed)
Dressing change performed this evening for ongoing slow ooze since surgery. Skin edge near penrose was the likely culprit. Silver nitrate applied to this site and widely to abscess cavity for raw surface ooze. Surgicel placed over wound bed; covered with makeshift pressure dressing - 4x4s and tape, abd pad.  Sharon Mt. Dema Severin, M.D. [facility]

## 2018-05-13 NOTE — Progress Notes (Signed)
Microlab called Therapist, sports. Pt is MRSA positive

## 2018-05-13 NOTE — Progress Notes (Signed)
Patient Report given to OR Nurse at 5205, Pt checklist started on Night shift both CHG bath given 2L 02. Personal belongings removed from bed and pants removed. last serum blood sugar noted in labs 110, MRSA and staph positive Bactroban started 4/17

## 2018-05-13 NOTE — Anesthesia Postprocedure Evaluation (Signed)
Anesthesia Post Note  Patient: Andrea [name]  Procedure(s) Performed: IRRIGATION AND DEBRIDEMENT  OF TWO GLUTEAL ABSCESSES (N/A Rectum)     Patient location during evaluation: PACU Anesthesia Type: General Level of consciousness: awake and alert Pain management: pain level controlled Vital Signs Assessment: post-procedure vital signs reviewed and stable Respiratory status: spontaneous breathing, nonlabored ventilation, respiratory function stable and patient connected to nasal cannula oxygen Cardiovascular status: blood pressure returned to baseline and stable Postop Assessment: no apparent nausea or vomiting Anesthetic complications: no    Last Vitals:  Vitals:   05/13/18 1236 05/13/18 1319  BP: (!) 154/58   Pulse: 74   Resp:  18  Temp: (!) 36.3 C   SpO2:      Last Pain:  Vitals:   05/13/18 1519  TempSrc:   PainSc: 9                  Barnet Glasgow

## 2018-05-13 NOTE — Progress Notes (Signed)
Came to see patient, in the OR.  Will try again later JV

## 2018-05-13 NOTE — Anesthesia Procedure Notes (Signed)
Procedure Name: Intubation Performed by: Milford Cage, CRNA Pre-anesthesia Checklist: Patient identified, Emergency Drugs available, Suction available and Patient being monitored Patient Re-evaluated:Patient Re-evaluated prior to induction Oxygen Delivery Method: Circle System Utilized Preoxygenation: Pre-oxygenation with 100% oxygen Induction Type: IV induction and Rapid sequence Laryngoscope Size: Mac and 3 Grade View: Grade II Tube type: Oral Tube size: 7.0 mm Number of attempts: 1 Airway Equipment and Method: Stylet and Oral airway Placement Confirmation: ETT inserted through vocal cords under direct vision,  positive ETCO2 and breath sounds checked- equal and bilateral Secured at: 22 cm Tube secured with: Tape Dental Injury: Teeth and Oropharynx as per pre-operative assessment

## 2018-05-13 NOTE — Progress Notes (Signed)
OT Cancellation Note  Patient Details Name: Andrea [name] MRN: [id number] DOB: [birth year]   Cancelled Treatment:    Reason Eval/Treat Not Completed: Patient at procedure or test/ unavailable(in OR for I&D)  [facility] 05/13/2018, 9:33 AM   Hulda Humphrey OTR/L Acute Rehabilitation Services Pager: [phone] Office: [phone]

## 2018-05-14 ENCOUNTER — Encounter (HOSPITAL_COMMUNITY): Payer: Self-pay | Admitting: Surgery

## 2018-05-14 LAB — CBC
HCT: 25.6 % — ABNORMAL LOW (ref 36.0–46.0)
Hemoglobin: 7.9 g/dL — ABNORMAL LOW (ref 12.0–15.0)
MCH: 29.5 pg (ref 26.0–34.0)
MCHC: 30.9 g/dL (ref 30.0–36.0)
MCV: 95.5 fL (ref 80.0–100.0)
Platelets: 110 10*3/uL — ABNORMAL LOW (ref 150–400)
RBC: 2.68 MIL/uL — ABNORMAL LOW (ref 3.87–5.11)
RDW: 14.7 % (ref 11.5–15.5)
WBC: 5.6 10*3/uL (ref 4.0–10.5)
nRBC: 0 % (ref 0.0–0.2)

## 2018-05-14 LAB — BASIC METABOLIC PANEL
Anion gap: 9 (ref 5–15)
BUN: 83 mg/dL — ABNORMAL HIGH (ref 8–23)
CO2: 34 mmol/L — ABNORMAL HIGH (ref 22–32)
Calcium: 8.4 mg/dL — ABNORMAL LOW (ref 8.9–10.3)
Chloride: 102 mmol/L (ref 98–111)
Creatinine, Ser: 1.86 mg/dL — ABNORMAL HIGH (ref 0.44–1.00)
GFR calc Af Amer: 32 mL/min — ABNORMAL LOW (ref >60)
GFR calc non Af Amer: 28 mL/min — ABNORMAL LOW (ref >60)
Glucose, Bld: 187 mg/dL — ABNORMAL HIGH (ref 70–99)
Potassium: 5.3 mmol/L — ABNORMAL HIGH (ref 3.5–5.1)
Sodium: 145 mmol/L (ref 135–145)

## 2018-05-14 LAB — GLUCOSE, CAPILLARY
Glucose-Capillary: 147 mg/dL — ABNORMAL HIGH (ref 70–99)
Glucose-Capillary: 131 mg/dL — ABNORMAL HIGH (ref 70–99)
Glucose-Capillary: 154 mg/dL — ABNORMAL HIGH (ref 70–99)
Glucose-Capillary: 125 mg/dL — ABNORMAL HIGH (ref 70–99)

## 2018-05-14 MED ORDER — HYDROMORPHONE HCL 1 MG/ML IJ SOLN: 0.5000 mg | INTRAMUSCULAR | Status: DC | PRN

## 2018-05-14 MED ORDER — SODIUM ZIRCONIUM CYCLOSILICATE 5 G PO PACK
5.0000 g | PACK | Freq: Every day | ORAL | Status: DC
  Administered 2018-05-14: 13:00:00 5 g via ORAL
  Filled 2018-05-14 (×2): qty 1

## 2018-05-14 MED ORDER — FUROSEMIDE 40 MG PO TABS: 40.0000 mg | ORAL_TABLET | Freq: Every day | ORAL | Status: DC

## 2018-05-14 NOTE — Progress Notes (Signed)
1 Day Post-Op    CC:  CHF  Subjective: She had bleeding and Dr. Dema Severin used Silver nitrate sticks and placed some surgicel last PM.  It looks fine this AM.  I took the dressing down, and it is dry.  I will leave packing in today and start Sitz baths/dressing change tomorrow.    Objective: Vital signs in last 24 hours: Temp:  [97.3 F (36.3 C)-97.9 F (36.6 C)] 97.8 F (36.6 C) (04/23 0505) Pulse Rate:  [67-82] 75 (04/23 0615) Resp:  [9-18] 16 (04/23 0615) BP: (122-212)/(46-102) 128/53 (04/23 0615) SpO2:  [94 %-99 %] 99 % (04/23 0505) Weight:  [81.3 kg-81.9 kg] 81.9 kg (04/23 0359) Last BM Date: 05/12/18 1080 Po 500 IV 1350 urine Stool x 1 Afebrile, VSS  Intake/Output from previous day: 04/22 0701 - 04/23 0700 In: 1583 [P.O.:1080; I.V.:503] Out: 1371 [Urine:1350; Stool:1; Blood:20] Intake/Output this shift: No intake/output data recorded.  General appearance: alert, cooperative, no distress and moves well in the bed Skin: No further bleeding penrose in place.  Surgicel in larger abscess opening.   No cellulitis around the open wound noted this AM. Lab Results:  Recent Labs    05/13/18 0427 05/14/18 0450  WBC 5.6 5.6  HGB 7.8* 7.9*  HCT 24.7* 25.6*  PLT 105* PENDING    BMET Recent Labs    05/13/18 0427 05/14/18 0450  NA 147* 145  K 5.1 5.3*  CL 104 102  CO2 34* 34*  GLUCOSE 110* 187*  BUN 78* 83*  CREATININE 1.45* 1.86*  CALCIUM 8.3* 8.4*   PT/INR No results for input(s): LABPROT, INR in the last 72 hours.  Recent Labs  Lab 05/07/18 1432  AST 47*  ALT 30  ALKPHOS 51  BILITOT 0.9  PROT 5.7*  ALBUMIN 2.8*     Lipase     Component Value Date/Time   LIPASE 32 05/07/2018 1432     Medications: . arformoterol  15 mcg Nebulization BID  . atorvastatin  80 mg Oral QHS  . budesonide  0.5 mg Nebulization BID  . busPIRone  15 mg Oral BID  . carvedilol  25 mg Oral BID WC  . Chlorhexidine Gluconate Cloth  6 each Topical Q0600  . clopidogrel  75 mg  Oral Daily  . divalproex  1,000 mg Oral Daily  . doxycycline  100 mg Oral Q12H  . enoxaparin (LOVENOX) injection  30 mg Subcutaneous Q24H  . folic acid  1 mg Oral Daily  . furosemide  80 mg Intravenous BID  . guaiFENesin  1,200 mg Oral BID  . hydrALAZINE  100 mg Oral Q8H  . insulin aspart  0-9 Units Subcutaneous TID WC  . insulin glargine  8 Units Subcutaneous QHS  . ipratropium-albuterol  3 mL Nebulization BID  . isosorbide mononitrate  30 mg Oral Daily  . levothyroxine  200 mcg Oral QAC breakfast  . Melatonin  3 mg Oral QHS  . morphine  15 mg Oral BID  . multivitamin with minerals  1 tablet Oral Q1200  . nutrition supplement (JUVEN)  1 packet Oral BID BM  . pantoprazole  40 mg Oral Daily  . polyethylene glycol  17 g Oral Daily  . pregabalin  75 mg Oral BID  . senna-docusate  2 tablet Oral BID  . sodium chloride flush  3 mL Intravenous Q12H    Assessment/Plan Chronic diastolic CHF (EF 44-03%4/74/2595) 3VCAD s/p PCI of proximal and distal LAD w/ DES x 2 (12/30/2017)on plavix Bilateral lung infiltrates -  COVID negative; RSV positive (04/27/18 & 04/11/18) IDDM HTN Hypothyroidism CKD III/IV - creatinine 1.44((4/20)>>1.57(4/21)>>1.45(4/22)>>1.86(4/23) Seizure disorder- on Depakote HLD Generalized anxiety disorder Chronic back pain - Morphine/Oxycodone at home for this Anemia H/o breast cancer s/p bilateral mastectomy followed by chemotherapy 2014 at [facility]   Subcutaneous abscess (4 x 3 x 3 cm and 2 x 2 x 2 cm) left buttock S/P Incision, drainage, and sharp debridement of 2 subcutaneous abscesses of the left buttock, 05/13/18, Dr. Donnie Mesa  POD#1  ID -rocephin 4/16>>4/17, augmentin 4/17>>19, doxycycline 4/16>> day 8 VTE -SCDs, lovenox - on Plavix also FEN - carb-modified diet post-op Foley -none Follow up -TBD    Plan:  Leave Surgicel in place today.  Start Sitz bath and dressing change tomorrow.  Pt wants to go home and she does not have anyone at home.  She may be  able to shower and just a dry dressing at discharge.  Will look at it tomorrow with packing out.        LOS: 7 days    Andrea [name] 05/14/2018 [phone]

## 2018-05-14 NOTE — Progress Notes (Signed)
Dr Eliseo Squires was informed of the pain left labia spot that is red and painful, order for warm compresses , advised Pamala Hurry the primary nurse of order

## 2018-05-14 NOTE — Progress Notes (Signed)
Patient c/o pain left labia area writer in to assess noted hardned area left labia that is red and painful to Consulting civil engineer paged MD on call to inform(surgical MD Dr.[email or web address]).

## 2018-05-14 NOTE — Progress Notes (Signed)
RN got report from off going RN about pt I and D procedure stating that pt wound has been draining a copious amount of blood. Off going RN states she has already changed the dressing because of the saturation of blood. Rn assessed wound and noticed the dressing was completely saturated in blood again. Rn paged MD. MD came to bedside, packed wound, administered silver nitrate and surgicel over wound. Will continue to monitor pt.

## 2018-05-14 NOTE — Progress Notes (Signed)
RN reassessed pt wound and dressing and there is no new drainage. Will continue to monitor pt.

## 2018-05-14 NOTE — Progress Notes (Signed)
PROGRESS NOTE    Andrea [name]  CBJ:[id number] DOB: [birth year] DOA: 05/07/2018 PCP: [facility], [facility]   Brief Narrative:  Patient is a 68 year old female past medical history of diastolic CHF, three-vessel CAD status post PCI, diabetes mellitus and stage III-4 CKD who presented to the emergency room on 4/16 with complaints of shortness of breath as well as weakness, unable to pick herself up off the floor.  She had been just discharged 10 days prior for a multi lobar pneumonia from RSV plus CHF exacerbation.  She had just left her skilled nursing facility after discharge 3 days before coming in to the back to the emergency room.  COVID ruled out.  Patient found to be in significant volume overload with 30 pound weight gain.  Also complained of right buttock pain where she is found to have an area of induration with fluctuance.    Assessment & Plan:   Principal Problem:   Acute on chronic diastolic congestive heart failure (HCC) Active Problems:   Essential hypertension   Insulin dependent diabetes mellitus (HCC)   Hypothyroidism   History of seizures   Chronic pain   CAD S/P percutaneous coronary angioplasty   Seizure (HCC)   Anemia   GAD (generalized anxiety disorder)   Hyperkalemia   CKD (chronic kidney disease), stage III (HCC)   Bilateral leg edema   FTT (failure to thrive) in adult   Pressure injury of skin   Cellulitis and abscess of buttock  acute on chronic diastolic heart failure Questionable etiology.  Patient denies any dietary indiscretions.  Patient does endorse compliance with all her medications.  - 30 pound weight gain over the course of 3 weeks and noted to have 3-4+ bilateral pitting edema on admission -Lower extremity Dopplers negative for DVT.   - Change lasix to PO and CR trending up - Continue Lipitor, Coreg, Plavix,imdur, hydralazine.  Strict I's and O's.  Daily weights.  Left buttocks cellulitis and abscess, POA - Ultrasound  of left gluteus was obtained subcutaneous edema noted in 2 areas of concern involving the medial left buttocks region.   -oral doxycycline which we will continue for now -s/p I/d by general surgery-- cultures sent  Hyperkalemia -start lokelma -? RTA?   Bilateral lung infiltrates Patient noted to have negative COVID-19 testing.   -RSV was positive on respiratory viral panel on 04/27/2018 and was also positive on 04/11/2018.  Concern for ongoing RSV viral shedding.    Coronary artery disease Stable.  Patient denies any chest pain.  Continue cardiac medications of Lipitor, Plavix, Coreg, Imdur, hydralazine.  Will need outpatient follow-up with cardiology.  Insulin-dependent diabetes mellitus type 2 Hemoglobin A1c was 6.4 on 03/15/2018.  CBG of 90 this morning.  Continue current regimen of Lantus and sliding scale insulin.   Hypothyroidism Continue home dose Synthroid.   Hypertension Systolic blood pressures noted to be in the 150s to the 170s.  Continue Coreg, Imdur.  Increase hydralazine back to home dose of 100 mg 3 times daily.  Follow.  Hypernatremia -Na trending down with PO intake  Seizure disorder Stable.  Continue Depakote.   Anxiety disorder BuSpar.   Chronic pain Continue current regimen of pain medication.   History of breast cancer    Debility Patient declines returning back to skilled nursing facility.  Will need to go home with home health on discharge.  Anemia of chronic kidney disease stage III Renal function improving.  Creatinine  2.37 on admission.  -s/p 1 unit PRBC 4/17 -  H/H stable    DVT prophylaxis: Lovenox Code Status: Full Family Communication: Updated patient. Disposition Plan: Patient wants to be discharged home with home health.  Patient does not want to go back to skilled nursing facility. -prior to d/c needs stable labs and equipment at home  Consultants:   General surgery   Procedures:   CT chest 05/07/2018  CXR 05/07/2018  Lower  extremity Dopplers 05/09/2018  [place] Left buttocks 05/10/2018  Antimicrobials:   Augmentin 05/08/2018>>>> 05/10/2018  Oral doxycycline 05/07/2018>>>>  IV Rocephin 05/07/2018>>>> 05/08/2018    Subjective: Had some bleeding last PM but none today Refusing SNF- wants to go home  Objective: Vitals:   05/14/18 0217 05/14/18 0359 05/14/18 0505 05/14/18 0615  BP:   139/76 (!) 128/53  Pulse:   80 75  Resp: 12  12 16   Temp:   97.8 F (36.6 C)   TempSrc:   Oral   SpO2:   99%   Weight:  81.9 kg    Height:        Intake/Output Summary (Last 24 hours) at 05/14/2018 1101 Last data filed at 05/14/2018 8242 Gross per 24 hour  Intake 1943 ml  Output 1021 ml  Net 922 ml   Filed Weights   05/13/18 0254 05/13/18 0914 05/14/18 0359  Weight: 81.3 kg 81.3 kg 81.9 kg    Examination:  General exam: chronically ill appearing , NAD Respiratory system: no wheezing, no increased work of breathing Cardiovascular system: rrr, + LE edema Gastrointestinal system: +BS, soft Central nervous system: moves all 4 ext, A+Ox3     Data Reviewed: I have personally reviewed following labs and imaging studies  CBC: Recent Labs  Lab 05/07/18 1432  05/09/18 0602 05/10/18 0505 05/11/18 0802 05/12/18 1000 05/13/18 0427 05/14/18 0450  WBC 6.9   < > 6.9 6.2 8.3  --  5.6 5.6  NEUTROABS 5.3  --  4.5 4.2  --   --  3.2  --   HGB 7.8*   < > 8.6* 8.7* 9.7* 9.0* 7.8* 7.9*  HCT 26.1*   < > 26.3* 27.8* 31.4* 28.6* 24.7* 25.6*  MCV 94.6   < > 92.9 94.6 94.3  --  95.7 95.5  PLT 118*   < > 109* 97* 123*  --  105* 110*   < > = values in this interval not displayed.   Basic Metabolic Panel: Recent Labs  Lab 05/10/18 0505 05/11/18 0802 05/12/18 1000 05/13/18 0427 05/14/18 0450  NA 143 148* 145 147* 145  K 5.1 5.4* 5.3* 5.1 5.3*  CL 102 106 103 104 102  CO2 30 30 33* 34* 34*  GLUCOSE 187* 94 176* 110* 187*  BUN 98* 84* 80* 78* 83*  CREATININE 1.52* 1.44* 1.57* 1.45* 1.86*  CALCIUM 8.3* 8.8* 8.8* 8.3*  8.4*   GFR: Estimated Creatinine Clearance: 30.4 mL/min (A) (by C-G formula based on SCr of 1.86 mg/dL (H)). Liver Function Tests: Recent Labs  Lab 05/07/18 1432  AST 47*  ALT 30  ALKPHOS 51  BILITOT 0.9  PROT 5.7*  ALBUMIN 2.8*   Recent Labs  Lab 05/07/18 1432  LIPASE 32   No results for input(s): AMMONIA in the last 168 hours. Coagulation Profile: No results for input(s): INR, PROTIME in the last 168 hours. Cardiac Enzymes: Recent Labs  Lab 05/07/18 1630  TROPONINI 0.04*   BNP (last 3 results) No results for input(s): PROBNP in the last 8760 hours. HbA1C: No results for input(s): HGBA1C in the last 72 hours.  CBG: Recent Labs  Lab 05/13/18 1113 05/13/18 1201 05/13/18 1623 05/13/18 2111 05/14/18 0554  GLUCAP 86 90 260* 281* 147*   Lipid Profile: No results for input(s): CHOL, HDL, LDLCALC, TRIG, CHOLHDL, LDLDIRECT in the last 72 hours. Thyroid Function Tests: No results for input(s): TSH, T4TOTAL, FREET4, T3FREE, THYROIDAB in the last 72 hours. Anemia Panel: No results for input(s): VITAMINB12, FOLATE, FERRITIN, TIBC, IRON, RETICCTPCT in the last 72 hours. Sepsis Labs: Recent Labs  Lab 05/08/18 0020  PROCALCITON 0.11    Recent Results (from the past 240 hour(s))  SARS Coronavirus 2 [facility] order, Performed in [facility] hospital lab)     Status: None   Collection Time: 05/07/18  3:58 PM  Result Value Ref Range Status   SARS Coronavirus 2 NEGATIVE NEGATIVE Final    Comment: (NOTE) If result is NEGATIVE SARS-CoV-2 target nucleic acids are NOT DETECTED. The SARS-CoV-2 RNA is generally detectable in upper and lower  respiratory specimens during the acute phase of infection. The lowest  concentration of SARS-CoV-2 viral copies this assay can detect is 250  copies / mL. A negative result does not preclude SARS-CoV-2 infection  and should not be used as the sole basis for treatment or other  patient management decisions.  A negative result may occur  with  improper specimen collection / handling, submission of specimen other  than nasopharyngeal swab, presence of viral mutation(s) within the  areas targeted by this assay, and inadequate number of viral copies  (<250 copies / mL). A negative result must be combined with clinical  observations, patient history, and epidemiological information. If result is POSITIVE SARS-CoV-2 target nucleic acids are DETECTED. The SARS-CoV-2 RNA is generally detectable in upper and lower  respiratory specimens dur ing the acute phase of infection.  Positive  results are indicative of active infection with SARS-CoV-2.  Clinical  correlation with patient history and other diagnostic information is  necessary to determine patient infection status.  Positive results do  not rule out bacterial infection or co-infection with other viruses. If result is PRESUMPTIVE POSTIVE SARS-CoV-2 nucleic acids MAY BE PRESENT.   A presumptive positive result was obtained on the submitted specimen  and confirmed on repeat testing.  While 2019 novel coronavirus  (SARS-CoV-2) nucleic acids may be present in the submitted sample  additional confirmatory testing may be necessary for epidemiological  and / or clinical management purposes  to differentiate between  SARS-CoV-2 and other Sarbecovirus currently known to infect humans.  If clinically indicated additional testing with an alternate test  methodology [phone]) is advised. The SARS-CoV-2 RNA is generally  detectable in upper and lower respiratory sp ecimens during the acute  phase of infection. The expected result is Negative. Fact Sheet for Patients:  StrictlyIdeas.no Fact Sheet for Healthcare Providers: BankingDealers.co.za This test is not yet approved or cleared by the [place] FDA and has been authorized for detection and/or diagnosis of SARS-CoV-2 by FDA under an Emergency Use Authorization (EUA).  This EUA  will remain in effect (meaning this test can be used) for the duration of the COVID-19 declaration under Section 564(b)(1) of the Act, 21 U.S.C. section 360bbb-3(b)(1), unless the authorization is terminated or revoked sooner. Performed at [facility], [place] [address], [place], [place] [address]   Respiratory Panel by PCR     Status: Abnormal   Collection Time: 05/07/18  3:58 PM  Result Value Ref Range Status   Adenovirus NOT DETECTED NOT DETECTED Final   Coronavirus 229E NOT  DETECTED NOT DETECTED Final    Comment: (NOTE) The Coronavirus on the Respiratory Panel, DOES NOT test for the novel  Coronavirus (2019 nCoV)    Coronavirus HKU1 NOT DETECTED NOT DETECTED Final   Coronavirus NL63 NOT DETECTED NOT DETECTED Final   Coronavirus OC43 NOT DETECTED NOT DETECTED Final   Metapneumovirus NOT DETECTED NOT DETECTED Final   Rhinovirus / Enterovirus NOT DETECTED NOT DETECTED Final   Influenza A NOT DETECTED NOT DETECTED Final   Influenza B NOT DETECTED NOT DETECTED Final   Parainfluenza Virus 1 NOT DETECTED NOT DETECTED Final   Parainfluenza Virus 2 NOT DETECTED NOT DETECTED Final   Parainfluenza Virus 3 NOT DETECTED NOT DETECTED Final   Parainfluenza Virus 4 NOT DETECTED NOT DETECTED Final   Respiratory Syncytial Virus DETECTED (A) NOT DETECTED Final    Comment: CRITICAL RESULT CALLED TO, READ BACK BY AND VERIFIED WITH: A.ROBERTS,RN 0457 05/08/18 G.MCADOO    Bordetella pertussis NOT DETECTED NOT DETECTED Final   Chlamydophila pneumoniae NOT DETECTED NOT DETECTED Final   Mycoplasma pneumoniae NOT DETECTED NOT DETECTED Final    Comment: Performed at [facility], [place] [address], [place], [place] [address]  MRSA PCR Screening     Status: Abnormal   Collection Time: 05/08/18  5:58 AM  Result Value Ref Range Status   MRSA by PCR POSITIVE (A) NEGATIVE Final    Comment:        The GeneXpert MRSA Assay (FDA approved for NASAL specimens only), is one component of a  comprehensive MRSA colonization surveillance program. It is not intended to diagnose MRSA infection nor to guide or monitor treatment for MRSA infections. RESULT CALLED TO, READ BACK BY AND VERIFIED WITH: Lorin Mercy RN, AT [phone] 05/08/18 BY Rush Landmark Performed at [facility], [address] [place], [place] [address]   Expectorated sputum assessment w rflx to resp cult     Status: None   Collection Time: 05/09/18  5:34 AM  Result Value Ref Range Status   Specimen Description SPUTUM  Final   Special Requests NONE  Final   Sputum evaluation   Final    Sputum specimen not acceptable for testing.  Please recollect.   RESULT CALLED TO, READ BACK BY AND VERIFIED WITH: RN Madilyn Fireman [phone] FCP Performed at [facility] [address] [place], [place] [address]    Report Status 05/09/2018 FINAL  Final  Surgical pcr screen     Status: Abnormal   Collection Time: 05/13/18  4:53 AM  Result Value Ref Range Status   MRSA, PCR POSITIVE (A) NEGATIVE Final   Staphylococcus aureus POSITIVE (A) NEGATIVE Final    Comment: CRITICAL RESULT CALLED TO, READ BACK BY AND VERIFIED WITH: H.HAMLIN AT 0711 ON [id number] BY SJW (NOTE) The Xpert SA Assay (FDA approved for NASAL specimens in patients 103 years of age and older), is one component of a comprehensive surveillance program. It is not intended to diagnose infection nor to guide or monitor treatment. Performed at [facility], [place] [address], [place], [place] [address]   Aerobic/Anaerobic Culture (surgical/deep wound)     Status: None (Preliminary result)   Collection Time: 05/13/18 10:27 AM  Result Value Ref Range Status   Specimen Description ABSCESS GLUTEAL  Final   Special Requests PATIENT ON FOLLOWING ANCEF  Final   Gram Stain   Final    RARE WBC PRESENT, PREDOMINANTLY PMN NO ORGANISMS SEEN Performed at [facility], [place]  [address], [place], [place] [address]    Culture PENDING  Incomplete   Report Status  PENDING  Incomplete         Radiology Studies: No results found.      Scheduled Meds: . arformoterol  15 mcg Nebulization BID  . atorvastatin  80 mg Oral QHS  . budesonide  0.5 mg Nebulization BID  . busPIRone  15 mg Oral BID  . carvedilol  25 mg Oral BID WC  . Chlorhexidine Gluconate Cloth  6 each Topical Q0600  . clopidogrel  75 mg Oral Daily  . divalproex  1,000 mg Oral Daily  . doxycycline  100 mg Oral Q12H  . enoxaparin (LOVENOX) injection  30 mg Subcutaneous Q24H  . folic acid  1 mg Oral Daily  . [START ON 05/15/2018] furosemide  40 mg Oral Daily  . hydrALAZINE  100 mg Oral Q8H  . insulin aspart  0-9 Units Subcutaneous TID WC  . insulin glargine  8 Units Subcutaneous QHS  . ipratropium-albuterol  3 mL Nebulization BID  . isosorbide mononitrate  30 mg Oral Daily  . levothyroxine  200 mcg Oral QAC breakfast  . Melatonin  3 mg Oral QHS  . morphine  15 mg Oral BID  . multivitamin with minerals  1 tablet Oral Q1200  . nutrition supplement (JUVEN)  1 packet Oral BID BM  . pantoprazole  40 mg Oral Daily  . polyethylene glycol  17 g Oral Daily  . pregabalin  75 mg Oral BID  . senna-docusate  2 tablet Oral BID  . sodium chloride flush  3 mL Intravenous Q12H  . sodium zirconium cyclosilicate  5 g Oral Daily   Continuous Infusions: . sodium chloride Stopped (05/08/18 0231)     LOS: 7 days    Time spent: 25 minutes    Geradine Girt, DO [facility]  If 7PM-7AM, please contact night-coverage [email or web address] 05/14/2018, 11:01 AM

## 2018-05-15 ENCOUNTER — Encounter (HOSPITAL_COMMUNITY): Payer: Self-pay | Admitting: Certified Registered Nurse Anesthetist

## 2018-05-15 ENCOUNTER — Encounter (HOSPITAL_COMMUNITY)
Admission: EM | Disposition: A | Discharge status: Nursing Home (Includes ICF) | Payer: Medicare Other | Source: Home / Self Care | Attending: Internal Medicine

## 2018-05-15 LAB — BASIC METABOLIC PANEL
Anion gap: 9 (ref 5–15)
BUN: 91 mg/dL — ABNORMAL HIGH (ref 8–23)
CO2: 33 mmol/L — ABNORMAL HIGH (ref 22–32)
Calcium: 8.1 mg/dL — ABNORMAL LOW (ref 8.9–10.3)
Chloride: 100 mmol/L (ref 98–111)
Creatinine, Ser: 1.87 mg/dL — ABNORMAL HIGH (ref 0.44–1.00)
GFR calc Af Amer: 32 mL/min — ABNORMAL LOW (ref >60)
GFR calc non Af Amer: 27 mL/min — ABNORMAL LOW (ref >60)
Glucose, Bld: 149 mg/dL — ABNORMAL HIGH (ref 70–99)
Potassium: 5.9 mmol/L — ABNORMAL HIGH (ref 3.5–5.1)
Sodium: 142 mmol/L (ref 135–145)

## 2018-05-15 LAB — CBC
HCT: 19.3 % — ABNORMAL LOW (ref 36.0–46.0)
Hemoglobin: 6.1 g/dL — CL (ref 12.0–15.0)
MCH: 30.5 pg (ref 26.0–34.0)
MCHC: 31.6 g/dL (ref 30.0–36.0)
MCV: 96.5 fL (ref 80.0–100.0)
Platelets: 109 10*3/uL — ABNORMAL LOW (ref 150–400)
RBC: 2 MIL/uL — ABNORMAL LOW (ref 3.87–5.11)
RDW: 14.8 % (ref 11.5–15.5)
WBC: 6.5 10*3/uL (ref 4.0–10.5)
nRBC: 0 % (ref 0.0–0.2)

## 2018-05-15 LAB — GLUCOSE, CAPILLARY
Glucose-Capillary: 129 mg/dL — ABNORMAL HIGH (ref 70–99)
Glucose-Capillary: 226 mg/dL — ABNORMAL HIGH (ref 70–99)
Glucose-Capillary: 195 mg/dL — ABNORMAL HIGH (ref 70–99)
Glucose-Capillary: 139 mg/dL — ABNORMAL HIGH (ref 70–99)

## 2018-05-15 LAB — PREPARE RBC (CROSSMATCH)

## 2018-05-15 SURGERY — CANCELLED PROCEDURE
Anesthesia: General | Laterality: Left

## 2018-05-15 MED ORDER — PROPOFOL 10 MG/ML IV BOLUS
INTRAVENOUS | Status: AC
  Filled 2018-05-15: qty 40

## 2018-05-15 MED ORDER — SODIUM CHLORIDE 0.9% IV SOLUTION
Freq: Once | INTRAVENOUS | Status: AC
  Administered 2018-05-15: 13:00:00 via INTRAVENOUS

## 2018-05-15 MED ORDER — BISACODYL 10 MG RE SUPP
10.0000 mg | Freq: Once | RECTAL | Status: DC
  Filled 2018-05-15 (×4): qty 1

## 2018-05-15 MED ORDER — MIDAZOLAM HCL 2 MG/2ML IJ SOLN
INTRAMUSCULAR | Status: AC
  Filled 2018-05-15: qty 2

## 2018-05-15 MED ORDER — CHLORHEXIDINE GLUCONATE CLOTH 2 % EX PADS: 6.0000 | MEDICATED_PAD | Freq: Once | CUTANEOUS | Status: DC

## 2018-05-15 MED ORDER — OXYCODONE HCL 5 MG PO TABS
5.0000 mg | ORAL_TABLET | Freq: Once | ORAL | Status: AC
  Administered 2018-05-15: 5 mg via ORAL
  Filled 2018-05-15: qty 1

## 2018-05-15 MED ORDER — CEFAZOLIN SODIUM-DEXTROSE 2-4 GM/100ML-% IV SOLN: 2.0000 g | INTRAVENOUS | Status: AC

## 2018-05-15 MED ORDER — FENTANYL CITRATE (PF) 250 MCG/5ML IJ SOLN
INTRAMUSCULAR | Status: AC
  Filled 2018-05-15: qty 5

## 2018-05-15 MED ORDER — FUROSEMIDE 10 MG/ML IJ SOLN
40.0000 mg | Freq: Two times a day (BID) | INTRAMUSCULAR | Status: DC
  Administered 2018-05-15 – 2018-05-16 (×3): 40 mg via INTRAVENOUS
  Filled 2018-05-15 (×3): qty 4

## 2018-05-15 MED ORDER — SODIUM ZIRCONIUM CYCLOSILICATE 10 G PO PACK
10.0000 g | PACK | Freq: Every day | ORAL | Status: DC
  Administered 2018-05-15 – 2018-05-17 (×3): 10 g via ORAL
  Filled 2018-05-15 (×4): qty 1

## 2018-05-15 MED ORDER — CHLORHEXIDINE GLUCONATE CLOTH 2 % EX PADS
6.0000 | MEDICATED_PAD | Freq: Once | CUTANEOUS | Status: AC
  Administered 2018-05-15: 6 via TOPICAL

## 2018-05-15 MED ORDER — SILVER NITRATE-POT NITRATE 75-25 % EX MISC
10.0000 "application " | CUTANEOUS | Status: DC | PRN
  Filled 2018-05-15: qty 10

## 2018-05-15 MED ORDER — 0.9 % SODIUM CHLORIDE (POUR BTL) OPTIME
TOPICAL | Status: DC | PRN
  Administered 2018-05-15: 04:00:00 1000 mL

## 2018-05-15 NOTE — Progress Notes (Signed)
PROGRESS NOTE    Andrea [name]  VZD:[id number] DOB: 01/30/1950 DOA: 05/07/2018 PCP: Medicine, [facility]   Brief Narrative:  Patient is a 68 year old female past medical history of diastolic CHF, three-vessel CAD status post PCI, diabetes mellitus and stage III-4 CKD who presented to the emergency room on 4/16 with complaints of shortness of breath as well as weakness, unable to pick herself up off the floor.  She had been just discharged 10 days prior for a multi lobar pneumonia from RSV plus CHF exacerbation.  She had just left her skilled nursing facility after discharge 3 days before coming in to the back to the emergency room.  COVID ruled out.  Patient found to be in significant volume overload with 30 pound weight gain.  Also complained of right buttock pain where she is found to have an area of induration with fluctuance.    Assessment & Plan:   Principal Problem:   Acute on chronic diastolic congestive heart failure (HCC) Active Problems:   Essential hypertension   Insulin dependent diabetes mellitus (HCC)   Hypothyroidism   History of seizures   Chronic pain   CAD S/P percutaneous coronary angioplasty   Seizure (HCC)   Anemia   GAD (generalized anxiety disorder)   Hyperkalemia   CKD (chronic kidney disease), stage III (HCC)   Bilateral leg edema   FTT (failure to thrive) in adult   Pressure injury of skin   Cellulitis and abscess of buttock  acute on chronic diastolic heart failure with low albumin - 30 pound weight gain over the course of 3 weeks and noted to have 3-4+ bilateral pitting edema on admission -Lower extremity Dopplers negative for DVT.   -continue IV lasix while getting platelets and PRBC - Continue Lipitor, Coreg, Plavix,imdur, hydralazine.  Strict I's and O's.  Daily weights.  Left buttocks cellulitis and abscess, POA - Ultrasound of left gluteus was obtained subcutaneous edema noted in 2 areas of concern involving the medial  left buttocks region.   -oral doxycycline which we will continue for now -s/p I/d by general surgery-- cultures sent -had episode of bleeding overnight -lovenox on hold -recent cath in dec of 2019 so not able to stop plavix  Labial redness (left) -appears to be draining with hot compressed -continue to monitor  Hyperkalemia -start lokelma -? RTA? -may need nephrology consult if continues to be up   Bilateral lung infiltrates Patient noted to have negative COVID-19 testing.   -RSV was positive on respiratory viral panel on 04/27/2018 and was also positive on 04/11/2018.  Concern for ongoing RSV viral shedding.    Coronary artery disease Stable.  Patient denies any chest pain.  Continue cardiac medications of Lipitor, Plavix, Coreg, Imdur, hydralazine.  Will need outpatient follow-up with cardiology.  Insulin-dependent diabetes mellitus type 2 Hemoglobin A1c was 6.4 on 03/15/2018 -Continue current regimen of Lantus and sliding scale insulin.   Hypothyroidism Continue home dose Synthroid.   Hypertension Systolic blood pressures noted to be in the 150s to the 170s.  Continue Coreg, Imdur.  Increase hydralazine back to home dose of 100 mg 3 times daily.  Follow.  Hypernatremia -Na trending down with PO intake  Seizure disorder Stable.  Continue Depakote.   Anxiety disorder BuSpar.   Chronic pain Continue current regimen of pain medication.   History of breast cancer    Debility Patient declines returning back to skilled nursing facility.  Will need to go home with home health on discharge but suspect as  she lives home alone and is really weak she will be back  Anemia of chronic kidney disease stage III Renal function stable-- follows with Dr. Johnney Ou  -h/h dropped after episode of bleeding    DVT prophylaxis: scd Code Status: Full Family Communication: Updated patient. Disposition Plan: Patient wants to be discharged home with home health.  Patient does not want to go  back to skilled nursing facility. -prior to d/c needs stable labs and equipment at home  Consultants:   General surgery   Procedures:   CT chest 05/07/2018  CXR 05/07/2018  Lower extremity Dopplers 05/09/2018  [place] Left buttocks 05/10/2018  Antimicrobials:   Augmentin 05/08/2018>>>> 05/10/2018  Oral doxycycline 05/07/2018>>>>  IV Rocephin 05/07/2018>>>> 05/08/2018    Subjective: C/o pain in lower back  Objective: Vitals:   05/15/18 0637 05/15/18 0830 05/15/18 0836 05/15/18 0837  BP:  (!) 122/92    Pulse:  80    Resp:  20    Temp:  98.4 F (36.9 C)    TempSrc:  Oral    SpO2:  100% 100% 100%  Weight: 84 kg     Height:        Intake/Output Summary (Last 24 hours) at 05/15/2018 0949 Last data filed at 05/14/2018 2200 Gross per 24 hour  Intake 600 ml  Output 500 ml  Net 100 ml   Filed Weights   05/13/18 0914 05/14/18 0359 05/15/18 0637  Weight: 81.3 kg 81.9 kg 84 kg    Examination:  In bed, uncomfortable appearing rrr Diminished breath sounds +LE edema +BS, soft Labia with open area-- was not able to palpate a firm nodule     Data Reviewed: I have personally reviewed following labs and imaging studies  CBC: Recent Labs  Lab 05/09/18 0602 05/10/18 0505 05/11/18 0802 05/12/18 1000 05/13/18 0427 05/14/18 0450 05/15/18 0604  WBC 6.9 6.2 8.3  --  5.6 5.6 6.5  NEUTROABS 4.5 4.2  --   --  3.2  --   --   HGB 8.6* 8.7* 9.7* 9.0* 7.8* 7.9* 6.1*  HCT 26.3* 27.8* 31.4* 28.6* 24.7* 25.6* 19.3*  MCV 92.9 94.6 94.3  --  95.7 95.5 96.5  PLT 109* 97* 123*  --  105* 110* 182*   Basic Metabolic Panel: Recent Labs  Lab 05/11/18 0802 05/12/18 1000 05/13/18 0427 05/14/18 0450 05/15/18 0604  NA 148* 145 147* 145 142  K 5.4* 5.3* 5.1 5.3* 5.9*  CL 106 103 104 102 100  CO2 30 33* 34* 34* 33*  GLUCOSE 94 176* 110* 187* 149*  BUN 84* 80* 78* 83* 91*  CREATININE 1.44* 1.57* 1.45* 1.86* 1.87*  CALCIUM 8.8* 8.8* 8.3* 8.4* 8.1*   GFR: Estimated Creatinine  Clearance: 30.6 mL/min (A) (by C-G formula based on SCr of 1.87 mg/dL (H)). Liver Function Tests: No results for input(s): AST, ALT, ALKPHOS, BILITOT, PROT, ALBUMIN in the last 168 hours. No results for input(s): LIPASE, AMYLASE in the last 168 hours. No results for input(s): AMMONIA in the last 168 hours. Coagulation Profile: No results for input(s): INR, PROTIME in the last 168 hours. Cardiac Enzymes: No results for input(s): CKTOTAL, CKMB, CKMBINDEX, TROPONINI in the last 168 hours. BNP (last 3 results) No results for input(s): PROBNP in the last 8760 hours. HbA1C: No results for input(s): HGBA1C in the last 72 hours. CBG: Recent Labs  Lab 05/14/18 0554 05/14/18 1157 05/14/18 1624 05/14/18 2056 05/15/18 0533  GLUCAP 147* 131* 154* 125* 129*   Lipid Profile: No results for  input(s): CHOL, HDL, LDLCALC, TRIG, CHOLHDL, LDLDIRECT in the last 72 hours. Thyroid Function Tests: No results for input(s): TSH, T4TOTAL, FREET4, T3FREE, THYROIDAB in the last 72 hours. Anemia Panel: No results for input(s): VITAMINB12, FOLATE, FERRITIN, TIBC, IRON, RETICCTPCT in the last 72 hours. Sepsis Labs: No results for input(s): PROCALCITON, LATICACIDVEN in the last 168 hours.  Recent Results (from the past 240 hour(s))  SARS Coronavirus 2 [facility] order, Performed in [place] hospital lab)     Status: None   Collection Time: 05/07/18  3:58 PM  Result Value Ref Range Status   SARS Coronavirus 2 NEGATIVE NEGATIVE Final    Comment: (NOTE) If result is NEGATIVE SARS-CoV-2 target nucleic acids are NOT DETECTED. The SARS-CoV-2 RNA is generally detectable in upper and lower  respiratory specimens during the acute phase of infection. The lowest  concentration of SARS-CoV-2 viral copies this assay can detect is 250  copies / mL. A negative result does not preclude SARS-CoV-2 infection  and should not be used as the sole basis for treatment or other  patient management decisions.  A negative  result may occur with  improper specimen collection / handling, submission of specimen other  than nasopharyngeal swab, presence of viral mutation(s) within the  areas targeted by this assay, and inadequate number of viral copies  (<250 copies / mL). A negative result must be combined with clinical  observations, patient history, and epidemiological information. If result is POSITIVE SARS-CoV-2 target nucleic acids are DETECTED. The SARS-CoV-2 RNA is generally detectable in upper and lower  respiratory specimens dur ing the acute phase of infection.  Positive  results are indicative of active infection with SARS-CoV-2.  Clinical  correlation with patient history and other diagnostic information is  necessary to determine patient infection status.  Positive results do  not rule out bacterial infection or co-infection with other viruses. If result is PRESUMPTIVE POSTIVE SARS-CoV-2 nucleic acids MAY BE PRESENT.   A presumptive positive result was obtained on the submitted specimen  and confirmed on repeat testing.  While 2019 novel coronavirus  (SARS-CoV-2) nucleic acids may be present in the submitted sample  additional confirmatory testing may be necessary for epidemiological  and / or clinical management purposes  to differentiate between  SARS-CoV-2 and other Sarbecovirus currently known to infect humans.  If clinically indicated additional testing with an alternate test  methodology [phone]) is advised. The SARS-CoV-2 RNA is generally  detectable in upper and lower respiratory sp ecimens during the acute  phase of infection. The expected result is Negative. Fact Sheet for Patients:  StrictlyIdeas.no Fact Sheet for Healthcare Providers: BankingDealers.co.za This test is not yet approved or cleared by the [place] FDA and has been authorized for detection and/or diagnosis of SARS-CoV-2 by FDA under an Emergency Use Authorization  (EUA).  This EUA will remain in effect (meaning this test can be used) for the duration of the COVID-19 declaration under Section 564(b)(1) of the Act, 21 U.S.C. section 360bbb-3(b)(1), unless the authorization is terminated or revoked sooner. Performed at [facility], [place] [address] [place], [place] [address]   Respiratory Panel by PCR     Status: Abnormal   Collection Time: 05/07/18  3:58 PM  Result Value Ref Range Status   Adenovirus NOT DETECTED NOT DETECTED Final   Coronavirus 229E NOT DETECTED NOT DETECTED Final    Comment: (NOTE) The Coronavirus on the Respiratory Panel, DOES NOT test for the novel  Coronavirus (2019 nCoV)    Coronavirus  HKU1 NOT DETECTED NOT DETECTED Final   Coronavirus NL63 NOT DETECTED NOT DETECTED Final   Coronavirus OC43 NOT DETECTED NOT DETECTED Final   Metapneumovirus NOT DETECTED NOT DETECTED Final   Rhinovirus / Enterovirus NOT DETECTED NOT DETECTED Final   Influenza A NOT DETECTED NOT DETECTED Final   Influenza B NOT DETECTED NOT DETECTED Final   Parainfluenza Virus 1 NOT DETECTED NOT DETECTED Final   Parainfluenza Virus 2 NOT DETECTED NOT DETECTED Final   Parainfluenza Virus 3 NOT DETECTED NOT DETECTED Final   Parainfluenza Virus 4 NOT DETECTED NOT DETECTED Final   Respiratory Syncytial Virus DETECTED (A) NOT DETECTED Final    Comment: CRITICAL RESULT CALLED TO, READ BACK BY AND VERIFIED WITH: A.ROBERTS,RN 0457 05/08/18 G.MCADOO    Bordetella pertussis NOT DETECTED NOT DETECTED Final   Chlamydophila pneumoniae NOT DETECTED NOT DETECTED Final   Mycoplasma pneumoniae NOT DETECTED NOT DETECTED Final    Comment: Performed at [facility], [place] [address] [place], [place] [address]  MRSA PCR Screening     Status: Abnormal   Collection Time: 05/08/18  5:58 AM  Result Value Ref Range Status   MRSA by PCR POSITIVE (A) NEGATIVE Final    Comment:        The GeneXpert MRSA Assay (FDA approved for NASAL specimens only), is one component  of a comprehensive MRSA colonization surveillance program. It is not intended to diagnose MRSA infection nor to guide or monitor treatment for MRSA infections. RESULT CALLED TO, READ BACK BY AND VERIFIED WITH: Lorin Mercy RN, AT [phone] 05/08/18 BY Rush Landmark Performed at [facility], [place] [address] [place], [place] [address]   Expectorated sputum assessment w rflx to resp cult     Status: None   Collection Time: 05/09/18  5:34 AM  Result Value Ref Range Status   Specimen Description SPUTUM  Final   Special Requests NONE  Final   Sputum evaluation   Final    Sputum specimen not acceptable for testing.  Please recollect.   RESULT CALLED TO, READ BACK BY AND VERIFIED WITH: RN Madilyn Fireman [phone] FCP Performed at [place] [address], [place], [place] [address]    Report Status 05/09/2018 FINAL  Final  Surgical pcr screen     Status: Abnormal   Collection Time: 05/13/18  4:53 AM  Result Value Ref Range Status   MRSA, PCR POSITIVE (A) NEGATIVE Final   Staphylococcus aureus POSITIVE (A) NEGATIVE Final    Comment: CRITICAL RESULT CALLED TO, READ BACK BY AND VERIFIED WITH: H.HAMLIN AT 0711 ON [id number] BY SJW (NOTE) The Xpert SA Assay (FDA approved for NASAL specimens in patients 88 years of age and older), is one component of a comprehensive surveillance program. It is not intended to diagnose infection nor to guide or monitor treatment. Performed at [facility], [place] [address], [place], [place] [address]   Aerobic/Anaerobic Culture (surgical/deep wound)     Status: None (Preliminary result)   Collection Time: 05/13/18 10:27 AM  Result Value Ref Range Status   Specimen Description ABSCESS GLUTEAL  Final   Special Requests PATIENT ON FOLLOWING ANCEF  Final   Gram Stain   Final    RARE WBC PRESENT, PREDOMINANTLY PMN NO ORGANISMS SEEN    Culture   Final    FEW STAPHYLOCOCCUS AUREUS SUSCEPTIBILITIES TO FOLLOW Performed at [facility], [place] [address], [place],  [address]    Report Status PENDING  Incomplete         Radiology Studies: No results found.      Scheduled Meds: . sodium chloride   Intravenous Once  . arformoterol  15 mcg Nebulization BID  . atorvastatin  80 mg Oral QHS  . budesonide  0.5 mg Nebulization BID  . busPIRone  15 mg Oral BID  . carvedilol  25 mg Oral BID WC  . Chlorhexidine Gluconate Cloth  6 each Topical Q0600  . Chlorhexidine Gluconate Cloth  6 each Topical Once  . clopidogrel  75 mg Oral Daily  . divalproex  1,000 mg Oral Daily  . doxycycline  100 mg Oral Q12H  . folic acid  1 mg Oral Daily  . furosemide  40 mg Intravenous Q12H  . hydrALAZINE  100 mg Oral Q8H  . insulin aspart  0-9 Units Subcutaneous TID WC  . insulin glargine  8 Units Subcutaneous QHS  . ipratropium-albuterol  3 mL Nebulization BID  . isosorbide mononitrate  30 mg Oral Daily  . levothyroxine  200 mcg Oral QAC breakfast  . Melatonin  3 mg Oral QHS  . morphine  15 mg Oral BID  . multivitamin with minerals  1 tablet Oral Q1200  . nutrition supplement (JUVEN)  1 packet Oral BID BM  . pantoprazole  40 mg Oral Daily  . polyethylene glycol  17 g Oral Daily  . pregabalin  75 mg Oral BID  . senna-docusate  2 tablet Oral BID  . sodium chloride flush  3 mL Intravenous Q12H  . sodium zirconium cyclosilicate  5 g Oral Daily   Continuous Infusions: . sodium chloride Stopped (05/08/18 0231)  .  ceFAZolin (ANCEF) IV       LOS: 8 days    Time spent: 35 minutes    Geradine Girt, DO [facility]  If 7PM-7AM, please contact night-coverage [email or web address] 05/15/2018, 9:49 AM

## 2018-05-15 NOTE — Progress Notes (Signed)
PT Cancellation Note  Patient Details Name: Andrea [name] MRN: [id number] DOB: Mar 26, 1950   Cancelled Treatment:    Reason Eval/Treat Not Completed: Pain limiting ability to participate(pt s/p additional bedside procedure of abscess this am and currently refusing mobility)   Andrea [name] 05/15/2018, 12:32 PM Elwyn Reach, [place] Pager: [phone] Office: [phone]

## 2018-05-15 NOTE — TOC Progression Note (Signed)
Transition of Care [facility] - Progression Note    Patient Details  Name: Andrea [name] MRN: [id number] Date of Birth: [birth year]  Transition of Care [facility]) CM/SW Contact  Royston Bake, [place] Phone Number:[phone] 05/15/2018, 2:48 PM  Clinical Narrative:    Post op day 2 abscess on buttocks debridement; oozing at the site; PRBC transfusion; [place] arranged with [facility]. ( pt is refusing SNF - in co pay days).    Expected Discharge Plan: [place] Barriers to Discharge: No Barriers Identified  Expected Discharge Plan and Services Expected Discharge Plan: [place] In-house Referral: NA Discharge Planning Services: CM Consult Post Acute Care Choice: [place] arrangements for the past 2 months: Single Family Home                 DME Arranged: N/A         HH Arranged: RN, Disease Management, OT, PT, Nurse's Aide, Social Work [employer] Agency: [facility]         Social Determinants of Health (SDOH) Interventions    Readmission Risk Interventions Readmission Risk Prevention Plan 04/12/2018  Transportation Screening Complete  Medication Review [employer] photographer) Complete  PCP or Specialist appointment within 3-5 days of discharge Complete  HRI or Home Care Consult Complete  SW Recovery Care/Counseling Consult Complete  [place] Complete  Some recent data might be hidden

## 2018-05-15 NOTE — Progress Notes (Signed)
2 Days Post-Op    CC:  Subjective: Re-occuring bleeding last PM, and this AM.  Anesthesia did not want to put her to sleep for exam in OR. Blood and platelets ordered.  I worked on controlling bleeding with direct pressure, Silver nitrate sticks(7), surgicel.  JP drain still in place.  Picture below.  Objective: Vital signs in last 24 hours: Temp:  [97.3 F (36.3 C)-97.6 F (36.4 C)] 97.6 F (36.4 C) (04/24 0326) Pulse Rate:  [68-76] 74 (04/24 0537) Resp:  [17-18] 18 (04/24 0326) BP: (110-143)/(45-65) 132/45 (04/24 0537) SpO2:  [95 %-98 %] 97 % (04/24 0326) Weight:  [84 kg] 84 kg (04/24 0637) Last BM Date: 05/14/18 960 PO 500 urine recorded Afebrile, VSS Creatinine is stable  Repeat CBC this AM show H/G down to 6.1/19.3 Platelets 109K    Intake/Output from previous day: 04/23 0701 - 04/24 0700 In: 960 [P.O.:960] Out: 500 [Urine:500] Intake/Output this shift: No intake/output data recorded.  General appearance: alert, cooperative and no distress Resp: clear to auscultation bilaterally Skin:  See picture below      Lab Results:  Recent Labs    05/14/18 0450 05/15/18 0604  WBC 5.6 6.5  HGB 7.9* 6.1*  HCT 25.6* 19.3*  PLT 110* 109*    BMET Recent Labs    05/14/18 0450 05/15/18 0604  NA 145 142  K 5.3* 5.9*  CL 102 100  CO2 34* 33*  GLUCOSE 187* 149*  BUN 83* 91*  CREATININE 1.86* 1.87*  CALCIUM 8.4* 8.1*   PT/INR No results for input(s): LABPROT, INR in the last 72 hours.  No results for input(s): AST, ALT, ALKPHOS, BILITOT, PROT, ALBUMIN in the last 168 hours.   Lipase     Component Value Date/Time   LIPASE 32 05/07/2018 1432     Medications: . sodium chloride   Intravenous Once  . arformoterol  15 mcg Nebulization BID  . atorvastatin  80 mg Oral QHS  . budesonide  0.5 mg Nebulization BID  . busPIRone  15 mg Oral BID  . carvedilol  25 mg Oral BID WC  . Chlorhexidine Gluconate Cloth  6 each Topical Q0600  . Chlorhexidine Gluconate  Cloth  6 each Topical Once  . clopidogrel  75 mg Oral Daily  . divalproex  1,000 mg Oral Daily  . doxycycline  100 mg Oral Q12H  . enoxaparin (LOVENOX) injection  30 mg Subcutaneous Q24H  . folic acid  1 mg Oral Daily  . furosemide  40 mg Oral Daily  . hydrALAZINE  100 mg Oral Q8H  . insulin aspart  0-9 Units Subcutaneous TID WC  . insulin glargine  8 Units Subcutaneous QHS  . ipratropium-albuterol  3 mL Nebulization BID  . isosorbide mononitrate  30 mg Oral Daily  . levothyroxine  200 mcg Oral QAC breakfast  . Melatonin  3 mg Oral QHS  . morphine  15 mg Oral BID  . multivitamin with minerals  1 tablet Oral Q1200  . nutrition supplement (JUVEN)  1 packet Oral BID BM  . pantoprazole  40 mg Oral Daily  . polyethylene glycol  17 g Oral Daily  . pregabalin  75 mg Oral BID  . senna-docusate  2 tablet Oral BID  . sodium chloride flush  3 mL Intravenous Q12H  . sodium zirconium cyclosilicate  5 g Oral Daily    Assessment/Plan Chronic diastolic CHF (EF 77-82%05/14/5359) 3VCAD s/p PCI of proximal and distal LAD w/ DES x 2 (12/30/2017)on plavix Bilateral lung  infiltrates - COVID negative; RSV positive (04/27/18 & 04/11/18) IDDM HTN Hypothyroidism CKD III/IV - creatinine 1.44((4/20)>>1.57(4/21)>>1.45(4/22)>>1.86(4/23) Seizure disorder- on Depakote HLD Generalized anxiety disorder Chronic back pain - Morphine/Oxycodone at home for this Anemia H/o breast cancer s/p bilateral mastectomy followed by chemotherapy 2014 [facility]   Subcutaneous abscess (4 x 3 x 3 cm and 2 x 2 x 2 cm) left buttock S/P Incision, drainage, and sharp debridement of 2 subcutaneous abscesses of the left buttock, 05/13/18, Dr. Donnie Mesa  POD#2  ID -rocephin 4/16>>4/17, augmentin 4/17>>19, doxycycline 4/16>> day 9 VTE -SCDs, lovenox - on Plavix also FEN -carb-modified diet post-op Foley -none Follow up -TBD    Plan:  Pt getting platelets and blood.  Hopefully we have it controlled again.  I spoke to  Dr. Eliseo Squires and she will watch for her fluid issues with blood and platelets.  We are going to hold the Lovenox also.  We cannot stop Plavix with recent stents on 12/30/17.       LOS: 8 days    Adhira Jamil 05/15/2018 [phone]

## 2018-05-15 NOTE — Progress Notes (Signed)
Bleeding from left buttock wound could not be controlled with pressure and Surgicel despite 1 hr of pressure Will need to go to OR for exploration and control D/W patient  Agrees to proceed   The procedure has been discussed with the patient.  Alternative therapies have been discussed with the patient.  Operative risks include bleeding,  Infection,  Organ injury,  Nerve injury,  Blood vessel injury,  DVT,  Pulmonary embolism,  Death,  And possible reoperation.  Medical management risks include worsening of present situation.  The success of the procedure is 50 -90 % at treating patients symptoms.  The patient understands and agrees to proceed.

## 2018-05-15 NOTE — Progress Notes (Signed)
Nutrition Follow-up  RD working remotely.  DOCUMENTATION CODES:   Not applicable  INTERVENTION:   -Continue 1 packet Juven BID, each packet provides 90 calories, 2.5 grams of protein, 8 grams of carbohydrate, and 14 grams of amino acids; supplement contains CaHMB, glutamine, and arginine, to promote wound healing -Continue MVI with minerals daily  NUTRITION DIAGNOSIS:   Increased nutrient needs related to wound healing as evidenced by estimated needs.  Ongoing  GOAL:   Patient will meet greater than or equal to 90% of their needs  Progressing  MONITOR:   PO intake, Supplement acceptance, Labs, Weight trends, Skin, I & O's  REASON FOR ASSESSMENT:   LOS    ASSESSMENT:   Andrea [name] is a 68 y.o. 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  4/19- two large abscesses on left buttock noted by RN 4/22- s/p Procedure performed: Incision, drainage, and sharp debridement of 2 subcutaneous abscesses of the left buttock; silver nitrate applied to abscess cavity due to excess bleeding 4/23- buttock wound continued to bleed; surgicel applied with no relief; plan for blood transfusion today  Reviewed I/O's: +460 ml x 24 hours and -837 ml since admission  UOP: 500 ml x 24 hours  Per general surgery notes, bleeding likely to to inability to stop plavix. No plans for further I&D at this time.   Pt continues with good appetite; meal completion documented at 50-100%. Pt is taking Juven and MVI supplements per MAR.  Labs reviewed: K: 5.9, CBGS: 125-129(inpatient orders for glycemic control are 0-9 units insulin aspart TID  with meals and 8 unit insulin glargine q HS).   Diet Order:   Diet Order            Diet NPO time specified  Diet effective midnight        Diet Carb Modified Fluid consistency: Thin; Room service appropriate? Yes; Fluid restriction: 1800 mL Fluid  Diet effective now              EDUCATION NEEDS:   No education needs have been identified at this time  Skin:  Skin Assessment: Skin Integrity Issues: Skin Integrity Issues:: Unstageable, Incisions Unstageable: buttocks x 2 Incisions: rectum  Last BM:  05/14/18  Height:   Ht Readings from Last 1 Encounters:  05/13/18 5\' 4"  (1.626 m)    Weight:   Wt Readings from Last 1 Encounters:  05/15/18 84 kg    Ideal Body Weight:  54.5 kg  BMI:  Body mass index is 31.79 kg/m.  Estimated Nutritional Needs:   Kcal:  1700-1900  Protein:  95-110 grams  Fluid:  > 1.7 L    Jaquan Sadowsky A. Jimmye Norman, RD, LDN, [place] Registered Dietitian II Certified Diabetes Care and Education Specialist Pager: [phone] After hours Pager: [phone]

## 2018-05-15 NOTE — Progress Notes (Signed)
Patient currently refusing to have blood at this time.  Will retry in an hour.  If unsuccessful will page on call provider.

## 2018-05-15 NOTE — Progress Notes (Signed)
Hgb 6.1, MD notified, will continue to monitor, Thanks, Arvella Nigh RN

## 2018-05-15 NOTE — Progress Notes (Signed)
Case discussed with anesthesia  She is anemic and may require blood products Anesthesia  Does  not want to proceed at this pont until blood can be obtained and due to COVID 19  this is on short supply and will need type and screen etc  On plavix and this is contributing as well Bleeding is a slow ooze but has not stopped with pressure and Surgicel but is slowing  Continue pressure dressing for now and reinforce as needed  Will postpone for now and discuss with Dr Georgette Dover later this am for reassessment

## 2018-05-15 NOTE — Progress Notes (Signed)
Pt's surgical site started oozing earlier tonight, applied dressings to reinforce the site, but no result in controlling the bleeding, called on call surgical MD, told to hold pressure for a least 20 minutes, held for 25 minutes, still no avail, called MD again and MD applied surgicel to help, will continue to monitor, Thanks Arvella Nigh RN.

## 2018-05-16 LAB — CBC
HCT: 18.6 % — ABNORMAL LOW (ref 36.0–46.0)
HCT: 24.4 % — ABNORMAL LOW (ref 36.0–46.0)
Hemoglobin: 5.9 g/dL — CL (ref 12.0–15.0)
Hemoglobin: 7.9 g/dL — ABNORMAL LOW (ref 12.0–15.0)
MCH: 30.3 pg (ref 26.0–34.0)
MCH: 29.7 pg (ref 26.0–34.0)
MCHC: 31.7 g/dL (ref 30.0–36.0)
MCHC: 32.4 g/dL (ref 30.0–36.0)
MCV: 95.4 fL (ref 80.0–100.0)
MCV: 91.7 fL (ref 80.0–100.0)
Platelets: 98 10*3/uL — ABNORMAL LOW (ref 150–400)
Platelets: 106 10*3/uL — ABNORMAL LOW (ref 150–400)
RBC: 1.95 MIL/uL — ABNORMAL LOW (ref 3.87–5.11)
RBC: 2.66 MIL/uL — ABNORMAL LOW (ref 3.87–5.11)
RDW: 14.6 % (ref 11.5–15.5)
RDW: 17.3 % — ABNORMAL HIGH (ref 11.5–15.5)
WBC: 4.7 10*3/uL (ref 4.0–10.5)
WBC: 5.1 10*3/uL (ref 4.0–10.5)
nRBC: 0 % (ref 0.0–0.2)
nRBC: 0 % (ref 0.0–0.2)

## 2018-05-16 LAB — URINALYSIS, ROUTINE W REFLEX MICROSCOPIC
Bacteria, UA: NONE SEEN
Bilirubin Urine: NEGATIVE
Glucose, UA: 50 mg/dL — AB
Ketones, ur: NEGATIVE mg/dL
Nitrite: NEGATIVE
Protein, ur: 100 mg/dL — AB
Specific Gravity, Urine: 1.011 (ref 1.005–1.030)
pH: 5 (ref 5.0–8.0)

## 2018-05-16 LAB — BPAM PLATELET PHERESIS
Blood Product Expiration Date: 202004252359
ISSUE DATE / TIME: 202004241644
Unit Type and Rh: 6200

## 2018-05-16 LAB — COMPREHENSIVE METABOLIC PANEL
ALT: 14 U/L (ref 0–44)
AST: 37 U/L (ref 15–41)
Albumin: 1.8 g/dL — ABNORMAL LOW (ref 3.5–5.0)
Alkaline Phosphatase: 41 U/L (ref 38–126)
Anion gap: 7 (ref 5–15)
BUN: 95 mg/dL — ABNORMAL HIGH (ref 8–23)
CO2: 33 mmol/L — ABNORMAL HIGH (ref 22–32)
Calcium: 8 mg/dL — ABNORMAL LOW (ref 8.9–10.3)
Chloride: 104 mmol/L (ref 98–111)
Creatinine, Ser: 2.14 mg/dL — ABNORMAL HIGH (ref 0.44–1.00)
GFR calc Af Amer: 27 mL/min — ABNORMAL LOW (ref >60)
GFR calc non Af Amer: 23 mL/min — ABNORMAL LOW (ref >60)
Glucose, Bld: 133 mg/dL — ABNORMAL HIGH (ref 70–99)
Potassium: 5.5 mmol/L — ABNORMAL HIGH (ref 3.5–5.1)
Sodium: 144 mmol/L (ref 135–145)
Total Bilirubin: 0.5 mg/dL (ref 0.3–1.2)
Total Protein: 4.3 g/dL — ABNORMAL LOW (ref 6.5–8.1)

## 2018-05-16 LAB — PREPARE PLATELET PHERESIS: Unit division: 0

## 2018-05-16 LAB — GLUCOSE, CAPILLARY
Glucose-Capillary: 100 mg/dL — ABNORMAL HIGH (ref 70–99)
Glucose-Capillary: 91 mg/dL (ref 70–99)
Glucose-Capillary: 156 mg/dL — ABNORMAL HIGH (ref 70–99)
Glucose-Capillary: 153 mg/dL — ABNORMAL HIGH (ref 70–99)

## 2018-05-16 LAB — PREPARE RBC (CROSSMATCH)

## 2018-05-16 MED ORDER — SODIUM CHLORIDE 0.9% IV SOLUTION
Freq: Once | INTRAVENOUS | Status: AC
  Administered 2018-05-16: 07:00:00 via INTRAVENOUS

## 2018-05-16 MED ORDER — ORAL CARE MOUTH RINSE
15.0000 mL | Freq: Two times a day (BID) | OROMUCOSAL | Status: DC
  Administered 2018-05-17 – 2018-05-21 (×8): 15 mL via OROMUCOSAL

## 2018-05-16 MED ORDER — FUROSEMIDE 10 MG/ML IJ SOLN: 40.0000 mg | Freq: Once | INTRAMUSCULAR | Status: DC | PRN

## 2018-05-16 MED ORDER — FUROSEMIDE 10 MG/ML IJ SOLN
80.0000 mg | Freq: Once | INTRAMUSCULAR | Status: AC
  Administered 2018-05-16: 80 mg via INTRAVENOUS
  Filled 2018-05-16: qty 8

## 2018-05-16 MED ORDER — ALBUMIN HUMAN 25 % IV SOLN
25.0000 g | Freq: Once | INTRAVENOUS | Status: AC
  Administered 2018-05-16: 22:00:00 25 g via INTRAVENOUS
  Filled 2018-05-16: qty 100

## 2018-05-16 NOTE — Progress Notes (Signed)
   Vital Signs MEWS/VS Documentation      05/16/2018 1914 05/16/2018 2049 05/16/2018 2110 05/16/2018 2200   MEWS Score:  0  0  -  2   MEWS Score Color:  Green  Green  -  Yellow   Resp:  -  18  -  -   Pulse:  -  68  -  -   BP:  -  (!) 159/67  -  -   Temp:  -  -  -  (!) 94.5 F (34.7 C)   O2 Device:  -  Nasal Cannula  Nasal Cannula  -   O2 Flow Rate (L/min):  -  3 L/min  2 L/min  -           Andrea [name] 05/16/2018,11:05 PM

## 2018-05-16 NOTE — Progress Notes (Signed)
Progress Note: General Surgery Service   Assessment/Plan: Principal Problem:   Acute on chronic diastolic congestive heart failure (HCC) Active Problems:   Essential hypertension   Insulin dependent diabetes mellitus (HCC)   Hypothyroidism   History of seizures   Chronic pain   CAD S/P percutaneous coronary angioplasty   Seizure (HCC)   Anemia   GAD (generalized anxiety disorder)   Hyperkalemia   CKD (chronic kidney disease), stage III (HCC)   Bilateral leg edema   FTT (failure to thrive) in adult   Pressure injury of skin   Cellulitis and abscess of buttock  s/p Procedure(s): IRRIGATION AND DEBRIDEMENT  OF TWO GLUTEAL ABSCESSES 05/13/2018  Subcutaneous abscess (4 x 3 x 3 cm and 2 x 2 x 2 cm) left buttock S/PIncision, drainage, and sharp debridement of 2 subcutaneous abscesses of the left buttock, 05/13/18, Dr. Donnie Mesa POD #3 -continue dressing care, may start sitz baths once Hgb > 7 with no active bleeding  ABLA- Hgb 5.9 today, getting 1 unit of blood, no active bleeding on exam  ID -rocephin 4/16>>4/17, augmentin 4/17>>19, doxycycline 4/16>>day 9 VTE -SCDs, lovenox- on Plavix also FEN -carb-modified diet post-op Foley -none Follow up -TBD   LOS: 9 days  Chief Complaint/Subjective: No acute issues overnight, uncomfortable  Objective: Vital signs in last 24 hours: Temp:  [97.2 F (36.2 C)-98.4 F (36.9 C)] 97.7 F (36.5 C) (04/25 0708) Pulse Rate:  [66-80] 70 (04/25 0708) Resp:  [12-20] 16 (04/25 0708) BP: (113-157)/(39-92) 139/53 (04/25 0708) SpO2:  [96 %-100 %] 100 % (04/25 0708) Weight:  [84.2 kg] 84.2 kg (04/25 0625) Last BM Date: 05/16/18  Intake/Output from previous day: 04/24 0701 - 04/25 0700 In: 768 [P.O.:480; Blood:288] Out: 800 [Urine:800] Intake/Output this shift: No intake/output data recorded.  Lungs: nonlabored  Cardiovascular: RRR  Back: left buttock wound with drain in place, no active bleeding  Extremities: no  edema  Neuro: AOx4  Lab Results: CBC  Recent Labs    05/15/18 0604 05/16/18 0325  WBC 6.5 4.7  HGB 6.1* 5.9*  HCT 19.3* 18.6*  PLT 109* 98*   BMET Recent Labs    05/15/18 0604 05/16/18 0325  NA 142 144  K 5.9* 5.5*  CL 100 104  CO2 33* 33*  GLUCOSE 149* 133*  BUN 91* 95*  CREATININE 1.87* 2.14*  CALCIUM 8.1* 8.0*   PT/INR No results for input(s): LABPROT, INR in the last 72 hours. ABG No results for input(s): PHART, HCO3 in the last 72 hours.  Invalid input(s): PCO2, PO2  Studies/Results:  Anti-infectives: Anti-infectives (From admission, onward)   Start     Dose/Rate Route Frequency Ordered Stop   05/15/18 0400  ceFAZolin (ANCEF) IVPB 2g/100 mL premix     2 g 200 mL/hr over 30 Minutes Intravenous To ShortStay Surgical 05/15/18 0343 05/16/18 0400   05/08/18 2100  amoxicillin-clavulanate (AUGMENTIN) 500-125 MG per tablet 500 mg  Status:  Discontinued     1 tablet Oral 2 times daily 05/08/18 1524 05/10/18 1546   05/07/18 2200  doxycycline (VIBRA-TABS) tablet 100 mg     100 mg Oral Every 12 hours 05/07/18 2009     05/07/18 2015  cefTRIAXone (ROCEPHIN) 1 g in sodium chloride 0.9 % 100 mL IVPB  Status:  Discontinued     1 g 200 mL/hr over 30 Minutes Intravenous Every 24 hours 05/07/18 2009 05/08/18 1524      Medications: Scheduled Meds: . arformoterol  15 mcg Nebulization BID  . atorvastatin  80 mg Oral QHS  . bisacodyl  10 mg Rectal Once  . budesonide  0.5 mg Nebulization BID  . busPIRone  15 mg Oral BID  . carvedilol  25 mg Oral BID WC  . Chlorhexidine Gluconate Cloth  6 each Topical Q0600  . Chlorhexidine Gluconate Cloth  6 each Topical Once  . clopidogrel  75 mg Oral Daily  . divalproex  1,000 mg Oral Daily  . doxycycline  100 mg Oral Q12H  . folic acid  1 mg Oral Daily  . furosemide  40 mg Intravenous Q12H  . hydrALAZINE  100 mg Oral Q8H  . insulin aspart  0-9 Units Subcutaneous TID WC  . insulin glargine  8 Units Subcutaneous QHS  .  ipratropium-albuterol  3 mL Nebulization BID  . isosorbide mononitrate  30 mg Oral Daily  . levothyroxine  200 mcg Oral QAC breakfast  . Melatonin  3 mg Oral QHS  . morphine  15 mg Oral BID  . multivitamin with minerals  1 tablet Oral Q1200  . nutrition supplement (JUVEN)  1 packet Oral BID BM  . pantoprazole  40 mg Oral Daily  . polyethylene glycol  17 g Oral Daily  . pregabalin  75 mg Oral BID  . senna-docusate  2 tablet Oral BID  . sodium chloride flush  3 mL Intravenous Q12H  . sodium zirconium cyclosilicate  10 g Oral Daily   Continuous Infusions: . sodium chloride Stopped (05/08/18 0231)   PRN Meds:.sodium chloride, 0.9 % irrigation (POUR BTL), acetaminophen **OR** acetaminophen, barrier cream, clonazePAM, furosemide, oxyCODONE, promethazine, silver nitrate applicators  Mickeal Skinner, MD [facility]

## 2018-05-16 NOTE — Progress Notes (Signed)
Patient refusing to do standing weight this AM.

## 2018-05-16 NOTE — Consult Note (Signed)
[facility] Renal Consultation Note  Requesting MD: [place] Indication for Consultation:  CKD  Chief complaint: shortness of breath  HPI:  Andrea [name] is a 68 y.o. female with a history of CKD, diabetes, hypertension, and CHF who presented to the hospital with shortness of breath and weakness.  She has been treated for CHF exacerbation.  Note that she has a recent admission with RSV pneumonia and after that point was discharged to a SNF.  She signed out against medical advice because she believed she was strong enough to go home but when she got home she was overwhelmed and not able to care for herself.  She is reported to have had a gain of over 30 lbs. she reports that she is much more swollen now than normal.  She states that she has been on Lasix since her heart cath.  She has shortness of breath with exertion especially when she ambulates and when she tries to talk while walking it is even worse.  Nephrology is consulted for assistance with diuretic management.  On review, her baseline creatinine has been approximately 2 since 12/2017.    Filed Weights   05/14/18 0359 05/15/18 0637 05/16/18 0625  Weight: 81.9 kg 84 kg 84.2 kg    PMHx:   Past Medical History:  Diagnosis Date  . Breast cancer ([place])    bilat mastectomy and LUE lymphadenectomy 2014, chemoRx 2014 - 15  . CHF (congestive heart failure) ([place])   . Diabetes mellitus without complication ([place])   . Esophageal reflux   . Hypertension   . Hypothyroid   . Migraine   . Seizure [facility])     Past Surgical History:  Procedure Laterality Date  . ABDOMINAL HYSTERECTOMY     1984 , done for ruptured cyst and endometriosis  . APPENDECTOMY    . CHOLECYSTECTOMY     1980's  . CORONARY STENT INTERVENTION N/A 12/30/2017   Procedure: CORONARY STENT INTERVENTION;  Surgeon: Martinique, Peter M, MD;  Location: [place] [facility];  Service: Cardiovascular;  Laterality: N/A;  . EYE SURGERY  08/28/2015   Right eye  . KNEE  SURGERY Left   . MASTECTOMY     bilateral  . Open surgery for bowel obstruction, 2000's    . PILONIDAL CYST EXCISION N/A 05/13/2018   Procedure: IRRIGATION AND DEBRIDEMENT  OF TWO GLUTEAL ABSCESSES;  Surgeon: Donnie Mesa, MD;  Location: [place];  Service: General;  Laterality: N/A;  . RIGHT/LEFT HEART CATH AND CORONARY ANGIOGRAPHY N/A 12/29/2017   Procedure: RIGHT/LEFT HEART CATH AND CORONARY ANGIOGRAPHY;  Surgeon: Martinique, Peter M, MD;  Location: [place] [facility];  Service: Cardiovascular;  Laterality: N/A;    Family Hx:  Family History  Problem Relation Age of Onset  . Sudden Cardiac Death Neg Hx     Social History:  reports that she has never smoked. She has never used smokeless tobacco. She reports that she does not drink alcohol or use drugs.  Allergies:  Allergies  Allergen Reactions  . Ticagrelor Rash  . Aspartame And Phenylalanine Hives and Diarrhea  . Aspirin Nausea And Vomiting  . Maxzide [Hydrochlorothiazide W-Triamterene] Swelling    Fluid retention  . Metformin And Related Diarrhea  . Nsaids Nausea And Vomiting  . Other Other (See Comments)    All steroids produce psychosis per pt Artificial sweeteners produce nausea and upset stomach.  Gregary Cromer [Pravastatin] Other (See Comments)    unknown  . Spironolactone   . Stadol [Butorphanol] Other (See Comments)  Toradol, etc And related- hallucinations  . Toradol [Ketorolac Tromethamine] Other (See Comments)    Hallucinations   . Vistaril [Hydroxyzine Hcl] Other (See Comments)  . Erythromycin Itching and Rash  . Morphine And Related Hives and Rash    Morphine given via IV    Medications: Prior to Admission medications   Medication Sig Start Date End Date Taking? Authorizing Provider  acetaminophen (TYLENOL) 325 MG tablet Take 2 tablets (650 mg total) by mouth every 6 (six) hours as needed for mild pain, fever or headache. 04/26/18  Yes Georgette Shell, MD  budesonide (PULMICORT) 0.5 MG/2ML nebulizer  solution Take 2 mLs (0.5 mg total) by nebulization 2 (two) times daily. 04/26/18  Yes Georgette Shell, MD  carvedilol (COREG) 6.25 MG tablet Take 6.25 mg by mouth 2 (two) times daily with a meal.   Yes [provider]  clopidogrel (PLAVIX) 75 MG tablet Take 1 tablet (75 mg total) by mouth daily. 04/26/18 04/26/19 Yes Georgette Shell, MD  pregabalin (LYRICA) 75 MG capsule Take 75 mg by mouth 2 (two) times daily.   Yes [provider]  promethazine (PHENERGAN) 25 MG tablet Take 25 mg by mouth every 6 (six) hours as needed for nausea.   Yes [provider]  spironolactone (ALDACTONE) 25 MG tablet Take 12.5 mg by mouth every morning.   Yes [provider]  arformoterol (BROVANA) 15 MCG/2ML NEBU Take 2 mLs (15 mcg total) by nebulization 2 (two) times daily. Patient not taking: Reported on 05/08/2018 04/26/18   Georgette Shell, MD  atorvastatin (LIPITOR) 80 MG tablet Take 1 tablet (80 mg total) by mouth at bedtime. Patient not taking: Reported on 05/08/2018 01/28/18   Lendon Colonel, NP  carvedilol (COREG) 25 MG tablet Take 1 tablet (25 mg total) by mouth 2 (two) times daily with a meal. Patient not taking: Reported on 05/08/2018 04/26/18   Georgette Shell, MD  furosemide (LASIX) 40 MG tablet Take 1 tablet (40 mg total) by mouth daily. Start taking 04/30/2018 check bmp 04/28/2018 Patient not taking: Reported on 05/08/2018 04/26/18 04/26/19  Georgette Shell, MD  guaiFENesin (MUCINEX) 600 MG 12 hr tablet Take 2 tablets (1,200 mg total) by mouth 2 (two) times daily. Patient not taking: Reported on 05/08/2018 04/26/18   Georgette Shell, MD  hydrALAZINE (APRESOLINE) 100 MG tablet Take 1 tablet (100 mg total) by mouth every 8 (eight) hours. Patient not taking: Reported on 05/08/2018 03/07/18   Lavina Hamman, MD  insulin glargine (LANTUS) 100 UNIT/ML injection Inject 0.1 mLs (10 Units total) into the skin at bedtime. Patient not taking: Reported on 05/08/2018 03/27/18    Debbe Odea, MD  isosorbide mononitrate (IMDUR) 30 MG 24 hr tablet Take 1 tablet (30 mg total) by mouth daily. Patient not taking: Reported on 05/08/2018 03/08/18   Lavina Hamman, MD  levothyroxine (SYNTHROID, LEVOTHROID) 200 MCG tablet Take 1 tablet (200 mcg total) by mouth at bedtime. Patient not taking: Reported on 05/08/2018 01/12/17   Lavina Hamman, MD  LORazepam (ATIVAN) 0.5 MG tablet Take 1 tablet (0.5 mg total) by mouth every 6 (six) hours as needed for anxiety. Patient not taking: Reported on 05/08/2018 04/26/18   Georgette Shell, MD  morphine (MS CONTIN) 15 MG 12 hr tablet Take 1 tablet (15 mg total) by mouth 2 (two) times daily. Patient not taking: Reported on 05/08/2018 04/26/18   Georgette Shell, MD  Multiple Vitamin (MULTIVITAMIN WITH MINERALS) TABS tablet Take 1  tablet by mouth daily. Patient not taking: Reported on 05/08/2018 03/27/18   Debbe Odea, MD  oxyCODONE (OXY IR/ROXICODONE) 5 MG immediate release tablet Take 1-2 tablets (5-10 mg total) by mouth every 4 (four) hours as needed for severe pain. Patient not taking: Reported on 05/08/2018 04/26/18   Georgette Shell, MD  predniSONE (DELTASONE) 10 MG tablet Take 4 tablets for the first 4 days then 3 tablets for the following 4 days then 2 tablets for the following 4 days and then 1 tablet daily till done Patient not taking: Reported on 05/08/2018 04/26/18   Georgette Shell, MD  senna-docusate (SENOKOT-S) 8.6-50 MG tablet Take 2 tablets by mouth 2 (two) times daily. Patient not taking: Reported on 05/08/2018 01/22/18   Geradine Girt, DO    I have reviewed the patient's current medications and prior to admission medications.  Labs:  BMP Latest Ref Rng & Units 05/16/2018 05/15/2018 05/14/2018  Glucose 70 - 99 mg/dL 133(H) 149(H) 187(H)  BUN 8 - 23 mg/dL 95(H) 91(H) 83(H)  Creatinine 0.44 - 1.00 mg/dL 2.14(H) 1.87(H) 1.86(H)  BUN/Creat Ratio 12 - 28 - - -  Sodium 135 - 145 mmol/L 144 142 145  Potassium 3.5 - 5.1 mmol/L  5.5(H) 5.9(H) 5.3(H)  Chloride 98 - 111 mmol/L 104 100 102  CO2 22 - 32 mmol/L 33(H) 33(H) 34(H)  Calcium 8.9 - 10.3 mg/dL 8.0(L) 8.1(L) 8.4(L)    Urinalysis    Component Value Date/Time   COLORURINE YELLOW 02/25/2018 2316   APPEARANCEUR HAZY (A) 02/25/2018 2316   LABSPEC 1.014 02/25/2018 2316   PHURINE 5.0 02/25/2018 2316   GLUCOSEU NEGATIVE 02/25/2018 2316   HGBUR SMALL (A) 02/25/2018 2316   BILIRUBINUR NEGATIVE 02/25/2018 2316   KETONESUR NEGATIVE 02/25/2018 2316   PROTEINUR >=300 (A) 02/25/2018 2316   UROBILINOGEN 1.0 12/02/2013 1612   NITRITE NEGATIVE 02/25/2018 2316   LEUKOCYTESUR SMALL (A) 02/25/2018 2316     ROS:  Pertinent items noted in HPI and remainder of comprehensive ROS otherwise negative.  Physical Exam: Vitals:   05/16/18 0945 05/16/18 1644  BP: (!) 144/58 (!) 156/77  Pulse: 72 68  Resp: 18 20  Temp: (!) 97.5 F (36.4 C)   SpO2: 100% 100%     General: Elderly female in bed in no acute distress at rest HEENT: Normocephalic atraumatic Eyes: Extraocular movements intact sclera anicteric Heart: Regular rate and rhythm no rub Lungs: Reduced breath sounds; Short of breath with exertion and prolonged speech Abdomen: Soft and nontender normal bowel sounds Extremities: 3+ edema bilateral lower extremities Skin: No rash on extremities exposed no cyanosis Neuro: Alert and oriented x3 provides a history and follows commands GU: No Foley  Assessment/Plan:  # CKD stage IV  - felt secondary to diabetic nephropathy as well as microvascular disease from HTN and pre-renal insults in the setting of CHF  - Note considerable azotemia  - discontinue lasix 40 mg IV BID  # Acute on chronic diastolic CHF (note EF 42/6834 was 30-35% and normal in 02/2018)  - Lasix 80 mg IV once now and assess response.  Hopefully the PRBC's will aid with diuresis - Will also administer albumin  # Acute hypoxic respiratory failure - 2/2 CHF - Supplemental oxygen as needed  -  Diurese as above   # Hyperkalemia  - On lokelma and diuretics as above.  Frequently hyperkalemic since 02/2018 - Lasix once now as above   # Hx proteinuria  - Per last renal consult work-up has been unremarkable  including HIV, RPR, complements normal, HB and HCV serologies neg. SPEP nl. SFLC c/w CKD. UPEP neg. AntiPLA2R eng.   No biopsy is needed - would defer RAAS blockade for now with hx concurrent hyperkalemia - UA and up/cr ratio   # HTN - Optimize volume status and otherwise continue current regimen - avoid hypotension   # Acute normocytic anemia - s/p PRBC's today  - note marked azotemia throughout recent days   Claudia Desanctis 05/16/2018, 5:02 PM

## 2018-05-16 NOTE — Plan of Care (Signed)
  Problem: Education: Goal: Knowledge of General Education information will improve Description Including pain rating scale, medication(s)/side effects and non-pharmacologic comfort measures Outcome: Progressing   Problem: Health Behavior/Discharge Planning: Goal: Ability to manage health-related needs will improve Outcome: Progressing   

## 2018-05-16 NOTE — Progress Notes (Signed)
Sitz bath completed, dressing change done.

## 2018-05-16 NOTE — Progress Notes (Signed)
   05/16/18 0400  Provider Notification  Provider Name/Title Kennon Holter, NP  Date Provider Notified 05/16/18  Time Provider Notified 0425  Notification Type Page  Notification Reason Other (Comment) (low hemoglobin)   Order given to transfuse one unit of pRBC.  Blood started prior to shift change.

## 2018-05-16 NOTE — Progress Notes (Addendum)
PROGRESS NOTE    Andrea [name]  DGL:[id number] DOB: Jun 20, 1950 DOA: 05/07/2018 PCP: Medicine, [facility]   Brief Narrative:  68 year old female past medical history of diastolic CHF, three-vessel CAD status post PCI, diabetes mellitus and stage III-4 CKD who presented to the emergency room on 4/16 with complaints of shortness of breath as well as weakness, unable to pick herself up off the floor. She had been just discharged 10 days PTA for a multi lobar pneumonia from RSV plus CHF exacerbation. She had just left her skilled nursing facility after discharge 3 days before coming in to the back to the emergency room. COVID ruled out. Patient found to be in significant volume overload with 30 pound weight gain. Also complained of right buttock pain where she is found to have an area of induration with fluctuance.  Patient was admitted, being diuresed, creatinine fluctuating, still appears fluid overloaded.  On diuretics.  Underwent debridement of left buttock abscess 4/22.   Subjective: Patient is asking "what is wrong with me" denies nausea vomiting chest pain shortness of breath.  Appears swollen edematous, mildly confused.  Assessment & Plan:   Acute on chronic diastolic congestive heart failure, and fluid overload, on Lasix 40 IV twice daily, UOP 800 ml/24 hr and total -868 neg. Wt is further up 177 lb 4/17-->185 lb. continue on the diuretics and consult nephrology  Bilateral lung infiltrates, negative for COVID-19.  RSV was positive on 4/6 and 3/21, concern for ongoing RSV viral shedding.  Is afebrile, WBC count 4700.  cont droplet precaution.  Continue pulmonary toileting Brovana, Pulmicort inhalers.  Hyperkalemia: Potassium slightly up, continue diuresis. On Lokelma 10 gm daily, Recent Labs  Lab 05/12/18 1000 05/13/18 0427 05/14/18 0450 05/15/18 0604 05/16/18 0325  K 5.3* 5.1 5.3* 5.9* 5.5*   CKD  stage III with AKI, remains fluid overloaded needing  diuresis.  Will have nephrology input for diuretics with fluctuation in the renal function Recent Labs  Lab 05/12/18 1000 05/13/18 0427 05/14/18 0450 05/15/18 0604 05/16/18 0325  CREATININE 1.57* 1.45* 1.86* 1.87* 2.14*   Cellulitis and abscess of buttock: Status post incision and drainage and sharp debridement of 2 abscesses of the left buttock 4/22, continue dressing, can start sitz bath once hemoglobin of 7.  Currently no active bleeding.  Drain in place.  Surgery following.  Continue on doxycycline.  She is afebrile and wbc stable  Anemia, multifactorial secondary to anemia of chronic disease, blood loss from episode of bleeding from the wound on the buttock. Getting 1 unit. recheck CBC. Off lovenox, unable to stop plavix due to stent 12/2017 Recent Labs  Lab 05/12/18 1000 05/13/18 0427 05/14/18 0450 05/15/18 0604 05/16/18 0325  HGB 9.0* 7.8* 7.9* 6.1* 5.9*  HCT 28.6* 24.7* 25.6* 19.3* 18.6*   CAD S/P PCI of proximal and distal LAD w DES X2 on  01/09/2018/Essential hypertension : No chest pain.  BP is stable.  Continue her Lipitor, Plavix, Coreg, Imdur, hydralazine.  FTT/moderate protein calorie malnutrition Albumin at 1.8.  Augment nutrition.  Insulin dependent diabetes mellitus hba1c is 6.4, 2/23. Stable.  Continue CBG checks and sliding scale insulin and Lantus. Recent Labs  Lab 05/15/18 1205 05/15/18 1625 05/15/18 2111 05/16/18 0623 05/16/18 1236  GLUCAP 226* 195* 139* 100* 91   Hypothyroidism: Continue home Synthroid.  Seizure disorder/GAD: Stable continue Depakote, BuSpar.  Chronic pain syndrome, continue current regimen.  Left labial redness , appears to be draining, continue heart compression.  Debility physical deconditioning.  Continue PT OT.  Patient declining to return to skilled nursing facility, may be open to new SNF.  She lives alone and is high risk for decompensation and readmission   DVT prophylaxis: SCD Lovenox stopped due ot bleeding Code  Status: FULL  Family Communication: discussed plan of care with the patient in detail explained her her current active issues.  She seems to be overwhelmed.  Called son [name] to update and discuss, but no answer, will re-attempt. Disposition Plan: remains inpatient pending clinical improvement.    Consultants:  Surgery Nephrology  Procedures:  CT chest 05/07/2018  CXR 05/07/2018  Lower extremity Dopplers 05/09/2018  [place] Left buttocks 05/10/2018  Antimicrobials:  Augmentin 05/08/2018>>>> 05/10/2018  Oral doxycycline 05/07/2018>>>>  IV Rocephin 05/07/2018>>>> 05/08/2018  Anti-infectives (From admission, onward)   Start     Dose/Rate Route Frequency Ordered Stop   05/15/18 0400  ceFAZolin (ANCEF) IVPB 2g/100 mL premix     2 g 200 mL/hr over 30 Minutes Intravenous To [facility] 05/15/18 0343 05/16/18 0400   05/08/18 2100  amoxicillin-clavulanate (AUGMENTIN) 500-125 MG per tablet 500 mg  Status:  Discontinued     1 tablet Oral 2 times daily 05/08/18 1524 05/10/18 1546   05/07/18 2200  doxycycline (VIBRA-TABS) tablet 100 mg     100 mg Oral Every 12 hours 05/07/18 2009     05/07/18 2015  cefTRIAXone (ROCEPHIN) 1 g in sodium chloride 0.9 % 100 mL IVPB  Status:  Discontinued     1 g 200 mL/hr over 30 Minutes Intravenous Every 24 hours 05/07/18 2009 05/08/18 1524       Objective: Vitals:   05/16/18 0915 05/16/18 0916 05/16/18 0919 05/16/18 0945  BP:    (!) 144/58  Pulse:    72  Resp:    18  Temp:    (!) 97.5 F (36.4 C)  TempSrc:    Oral  SpO2: 100% 100% 100% 100%  Weight:      Height:        Intake/Output Summary (Last 24 hours) at 05/16/2018 1306 Last data filed at 05/16/2018 0945 Gross per 24 hour  Intake 902 ml  Output 350 ml  Net 552 ml   Filed Weights   05/14/18 0359 05/15/18 0637 05/16/18 0625  Weight: 81.9 kg 84 kg 84.2 kg   Weight change: 0.227 kg  Body mass index is 31.88 kg/m.  Intake/Output from previous day: 04/24 0701 - 04/25 0700 In:  768 [P.O.:480; Blood:288] Out: 800 [Urine:800] Intake/Output this shift: Total I/O In: 374 [I.V.:20; Blood:354] Out: -   Examination:  General exam: Appears calm and comfortable, mildly confused, chronically sick looking older for her age,  HEENT:PERRL,Oral mucosa moist, Ear/Nose normal on gross exam Respiratory system: Bilateral equal air entry, normal vesicular breath sounds, no wheezes or crackles  Cardiovascular system: S1 & S2 heard,No JVD, murmurs. Gastrointestinal system: Abdomen is  soft, non tender, non distended, BS +  Nervous System:Alert and oriented. No focal neurological deficits/moving extremities, sensation intact. Extremities: b/l LE edema,distal peripheral pulses palpable. Skin: No rashes, lesions, no icterus MSK: Normal muscle bulk,tone ,power Wound of the left buttock-with drain in place, no active bleeding.  Medications:  Scheduled Meds: . arformoterol  15 mcg Nebulization BID  . atorvastatin  80 mg Oral QHS  . bisacodyl  10 mg Rectal Once  . budesonide  0.5 mg Nebulization BID  . busPIRone  15 mg Oral BID  . carvedilol  25 mg Oral BID WC  . Chlorhexidine Gluconate Cloth  6 each Topical Q0600  .  Chlorhexidine Gluconate Cloth  6 each Topical Once  . clopidogrel  75 mg Oral Daily  . divalproex  1,000 mg Oral Daily  . doxycycline  100 mg Oral Q12H  . folic acid  1 mg Oral Daily  . furosemide  40 mg Intravenous Q12H  . hydrALAZINE  100 mg Oral Q8H  . insulin aspart  0-9 Units Subcutaneous TID WC  . insulin glargine  8 Units Subcutaneous QHS  . ipratropium-albuterol  3 mL Nebulization BID  . isosorbide mononitrate  30 mg Oral Daily  . levothyroxine  200 mcg Oral QAC breakfast  . Melatonin  3 mg Oral QHS  . morphine  15 mg Oral BID  . multivitamin with minerals  1 tablet Oral Q1200  . nutrition supplement (JUVEN)  1 packet Oral BID BM  . pantoprazole  40 mg Oral Daily  . polyethylene glycol  17 g Oral Daily  . pregabalin  75 mg Oral BID  .  senna-docusate  2 tablet Oral BID  . sodium chloride flush  3 mL Intravenous Q12H  . sodium zirconium cyclosilicate  10 g Oral Daily   Continuous Infusions: . sodium chloride Stopped (05/08/18 0231)    Data Reviewed: I have personally reviewed following labs and imaging studies  CBC: Recent Labs  Lab 05/10/18 0505 05/11/18 0802 05/12/18 1000 05/13/18 0427 05/14/18 0450 05/15/18 0604 05/16/18 0325  WBC 6.2 8.3  --  5.6 5.6 6.5 4.7  NEUTROABS 4.2  --   --  3.2  --   --   --   HGB 8.7* 9.7* 9.0* 7.8* 7.9* 6.1* 5.9*  HCT 27.8* 31.4* 28.6* 24.7* 25.6* 19.3* 18.6*  MCV 94.6 94.3  --  95.7 95.5 96.5 95.4  PLT 97* 123*  --  105* 110* 109* 98*   Basic Metabolic Panel: Recent Labs  Lab 05/12/18 1000 05/13/18 0427 05/14/18 0450 05/15/18 0604 05/16/18 0325  NA 145 147* 145 142 144  K 5.3* 5.1 5.3* 5.9* 5.5*  CL 103 104 102 100 104  CO2 33* 34* 34* 33* 33*  GLUCOSE 176* 110* 187* 149* 133*  BUN 80* 78* 83* 91* 95*  CREATININE 1.57* 1.45* 1.86* 1.87* 2.14*  CALCIUM 8.8* 8.3* 8.4* 8.1* 8.0*   GFR: Estimated Creatinine Clearance: 26.8 mL/min (A) (by C-G formula based on SCr of 2.14 mg/dL (H)). Liver Function Tests: Recent Labs  Lab 05/16/18 0325  AST 37  ALT 14  ALKPHOS 41  BILITOT 0.5  PROT 4.3*  ALBUMIN 1.8*   No results for input(s): LIPASE, AMYLASE in the last 168 hours. No results for input(s): AMMONIA in the last 168 hours. Coagulation Profile: No results for input(s): INR, PROTIME in the last 168 hours. Cardiac Enzymes: No results for input(s): CKTOTAL, CKMB, CKMBINDEX, TROPONINI in the last 168 hours. BNP (last 3 results) No results for input(s): PROBNP in the last 8760 hours. HbA1C: No results for input(s): HGBA1C in the last 72 hours. CBG: Recent Labs  Lab 05/15/18 1205 05/15/18 1625 05/15/18 2111 05/16/18 0623 05/16/18 1236  GLUCAP 226* 195* 139* 100* 91   Lipid Profile: No results for input(s): CHOL, HDL, LDLCALC, TRIG, CHOLHDL, LDLDIRECT in  the last 72 hours. Thyroid Function Tests: No results for input(s): TSH, T4TOTAL, FREET4, T3FREE, THYROIDAB in the last 72 hours. Anemia Panel: No results for input(s): VITAMINB12, FOLATE, FERRITIN, TIBC, IRON, RETICCTPCT in the last 72 hours. Sepsis Labs: No results for input(s): PROCALCITON, LATICACIDVEN in the last 168 hours.  Recent Results (from the past 240  hour(s))  SARS Coronavirus 2 [facility] order, Performed in [facility] hospital lab)     Status: None   Collection Time: 05/07/18  3:58 PM  Result Value Ref Range Status   SARS Coronavirus 2 NEGATIVE NEGATIVE Final    Comment: (NOTE) If result is NEGATIVE SARS-CoV-2 target nucleic acids are NOT DETECTED. The SARS-CoV-2 RNA is generally detectable in upper and lower  respiratory specimens during the acute phase of infection. The lowest  concentration of SARS-CoV-2 viral copies this assay can detect is 250  copies / mL. A negative result does not preclude SARS-CoV-2 infection  and should not be used as the sole basis for treatment or other  patient management decisions.  A negative result may occur with  improper specimen collection / handling, submission of specimen other  than nasopharyngeal swab, presence of viral mutation(s) within the  areas targeted by this assay, and inadequate number of viral copies  (<250 copies / mL). A negative result must be combined with clinical  observations, patient history, and epidemiological information. If result is POSITIVE SARS-CoV-2 target nucleic acids are DETECTED. The SARS-CoV-2 RNA is generally detectable in upper and lower  respiratory specimens dur ing the acute phase of infection.  Positive  results are indicative of active infection with SARS-CoV-2.  Clinical  correlation with patient history and other diagnostic information is  necessary to determine patient infection status.  Positive results do  not rule out bacterial infection or co-infection with other viruses. If result  is PRESUMPTIVE POSTIVE SARS-CoV-2 nucleic acids MAY BE PRESENT.   A presumptive positive result was obtained on the submitted specimen  and confirmed on repeat testing.  While 2019 novel coronavirus  (SARS-CoV-2) nucleic acids may be present in the submitted sample  additional confirmatory testing may be necessary for epidemiological  and / or clinical management purposes  to differentiate between  SARS-CoV-2 and other Sarbecovirus currently known to infect humans.  If clinically indicated additional testing with an alternate test  methodology [phone]) is advised. The SARS-CoV-2 RNA is generally  detectable in upper and lower respiratory sp ecimens during the acute  phase of infection. The expected result is Negative. Fact Sheet for Patients:  StrictlyIdeas.no Fact Sheet for Healthcare Providers: BankingDealers.co.za This test is not yet approved or cleared by the [place] FDA and has been authorized for detection and/or diagnosis of SARS-CoV-2 by FDA under an Emergency Use Authorization (EUA).  This EUA will remain in effect (meaning this test can be used) for the duration of the COVID-19 declaration under Section 564(b)(1) of the Act, 21 U.S.C. section 360bbb-3(b)(1), unless the authorization is terminated or revoked sooner. Performed at [facility], [place] [address], [place],  [address]   Respiratory Panel by PCR     Status: Abnormal   Collection Time: 05/07/18  3:58 PM  Result Value Ref Range Status   Adenovirus NOT DETECTED NOT DETECTED Final   Coronavirus 229E NOT DETECTED NOT DETECTED Final    Comment: (NOTE) The Coronavirus on the Respiratory Panel, DOES NOT test for the novel  Coronavirus (2019 nCoV)    Coronavirus HKU1 NOT DETECTED NOT DETECTED Final   Coronavirus NL63 NOT DETECTED NOT DETECTED Final   Coronavirus OC43 NOT DETECTED NOT DETECTED Final   Metapneumovirus NOT DETECTED NOT DETECTED Final    Rhinovirus / Enterovirus NOT DETECTED NOT DETECTED Final   Influenza A NOT DETECTED NOT DETECTED Final   Influenza B NOT DETECTED NOT DETECTED Final   Parainfluenza Virus 1 NOT  DETECTED NOT DETECTED Final   Parainfluenza Virus 2 NOT DETECTED NOT DETECTED Final   Parainfluenza Virus 3 NOT DETECTED NOT DETECTED Final   Parainfluenza Virus 4 NOT DETECTED NOT DETECTED Final   Respiratory Syncytial Virus DETECTED (A) NOT DETECTED Final    Comment: CRITICAL RESULT CALLED TO, READ BACK BY AND VERIFIED WITH: A.ROBERTS,RN 0457 05/08/18 G.MCADOO    Bordetella pertussis NOT DETECTED NOT DETECTED Final   Chlamydophila pneumoniae NOT DETECTED NOT DETECTED Final   Mycoplasma pneumoniae NOT DETECTED NOT DETECTED Final    Comment: Performed at [facility], [place] [address] [place], [place] [address]  MRSA PCR Screening     Status: Abnormal   Collection Time: 05/08/18  5:58 AM  Result Value Ref Range Status   MRSA by PCR POSITIVE (A) NEGATIVE Final    Comment:        The GeneXpert MRSA Assay (FDA approved for NASAL specimens only), is one component of a comprehensive MRSA colonization surveillance program. It is not intended to diagnose MRSA infection nor to guide or monitor treatment for MRSA infections. RESULT CALLED TO, READ BACK BY AND VERIFIED WITH: Lorin Mercy RN, AT [phone] 05/08/18 BY Rush Landmark Performed at [facility], [place] [address] [place], [place] [address]   Expectorated sputum assessment w rflx to resp cult     Status: None   Collection Time: 05/09/18  5:34 AM  Result Value Ref Range Status   Specimen Description SPUTUM  Final   Special Requests NONE  Final   Sputum evaluation   Final    Sputum specimen not acceptable for testing.  Please recollect.   RESULT CALLED TO, READ BACK BY AND VERIFIED WITH: RN Madilyn Fireman [phone] FCP Performed at [place] [address] [place], [place] [address]    Report Status 05/09/2018 FINAL  Final  Surgical pcr screen      Status: Abnormal   Collection Time: 05/13/18  4:53 AM  Result Value Ref Range Status   MRSA, PCR POSITIVE (A) NEGATIVE Final   Staphylococcus aureus POSITIVE (A) NEGATIVE Final    Comment: CRITICAL RESULT CALLED TO, READ BACK BY AND VERIFIED WITH: H.HAMLIN AT 0711 ON [id number] BY SJW (NOTE) The Xpert SA Assay (FDA approved for NASAL specimens in patients 64 years of age and older), is one component of a comprehensive surveillance program. It is not intended to diagnose infection nor to guide or monitor treatment. Performed at [facility], [place] [address] [place], [place] [address]   Aerobic/Anaerobic Culture (surgical/deep wound)     Status: None (Preliminary result)   Collection Time: 05/13/18 10:27 AM  Result Value Ref Range Status   Specimen Description ABSCESS GLUTEAL  Final   Special Requests PATIENT ON FOLLOWING ANCEF  Final   Gram Stain   Final    RARE WBC PRESENT, PREDOMINANTLY PMN NO ORGANISMS SEEN Performed at [facility], [place] [address], [place], [place] [address]    Culture   Final    FEW METHICILLIN RESISTANT STAPHYLOCOCCUS AUREUS NO ANAEROBES ISOLATED; CULTURE IN PROGRESS FOR 5 DAYS    Report Status PENDING  Incomplete   Organism ID, Bacteria METHICILLIN RESISTANT STAPHYLOCOCCUS AUREUS  Final      Susceptibility   Methicillin resistant staphylococcus aureus - MIC*    CIPROFLOXACIN >=8 RESISTANT Resistant     ERYTHROMYCIN >=8 RESISTANT Resistant     GENTAMICIN <=0.5 SENSITIVE Sensitive     OXACILLIN >=4 RESISTANT Resistant  TETRACYCLINE <=1 SENSITIVE Sensitive     VANCOMYCIN 1 SENSITIVE Sensitive     TRIMETH/SULFA <=10 SENSITIVE Sensitive     CLINDAMYCIN <=0.25 SENSITIVE Sensitive     RIFAMPIN <=0.5 SENSITIVE Sensitive     Inducible Clindamycin NEGATIVE Sensitive     * FEW METHICILLIN RESISTANT STAPHYLOCOCCUS AUREUS      Radiology Studies: No results found.    LOS: 9 days   Time spent: More than 50% of that time was spent in  counseling and/or coordination of care.  Antonieta Pert, MD [facility]  05/16/2018, 1:07 PM

## 2018-05-16 NOTE — Progress Notes (Signed)
Oral temp low, warm blanket placed.

## 2018-05-17 LAB — CBC
HCT: 22.8 % — ABNORMAL LOW (ref 36.0–46.0)
HCT: 25.4 % — ABNORMAL LOW (ref 36.0–46.0)
Hemoglobin: 7.1 g/dL — ABNORMAL LOW (ref 12.0–15.0)
Hemoglobin: 8 g/dL — ABNORMAL LOW (ref 12.0–15.0)
MCH: 28.7 pg (ref 26.0–34.0)
MCH: 29.1 pg (ref 26.0–34.0)
MCHC: 31.1 g/dL (ref 30.0–36.0)
MCHC: 31.5 g/dL (ref 30.0–36.0)
MCV: 92.3 fL (ref 80.0–100.0)
MCV: 92.4 fL (ref 80.0–100.0)
Platelets: 98 10*3/uL — ABNORMAL LOW (ref 150–400)
Platelets: 109 10*3/uL — ABNORMAL LOW (ref 150–400)
RBC: 2.47 MIL/uL — ABNORMAL LOW (ref 3.87–5.11)
RBC: 2.75 MIL/uL — ABNORMAL LOW (ref 3.87–5.11)
RDW: 17.2 % — ABNORMAL HIGH (ref 11.5–15.5)
RDW: 17.1 % — ABNORMAL HIGH (ref 11.5–15.5)
WBC: 3.8 10*3/uL — ABNORMAL LOW (ref 4.0–10.5)
WBC: 5.5 10*3/uL (ref 4.0–10.5)
nRBC: 0 % (ref 0.0–0.2)
nRBC: 0 % (ref 0.0–0.2)

## 2018-05-17 LAB — OCCULT BLOOD X 1 CARD TO LAB, STOOL: Fecal Occult Bld: NEGATIVE

## 2018-05-17 LAB — BASIC METABOLIC PANEL
Anion gap: 10 (ref 5–15)
BUN: 96 mg/dL — ABNORMAL HIGH (ref 8–23)
CO2: 33 mmol/L — ABNORMAL HIGH (ref 22–32)
Calcium: 8.3 mg/dL — ABNORMAL LOW (ref 8.9–10.3)
Chloride: 101 mmol/L (ref 98–111)
Creatinine, Ser: 1.86 mg/dL — ABNORMAL HIGH (ref 0.44–1.00)
GFR calc Af Amer: 32 mL/min — ABNORMAL LOW (ref >60)
GFR calc non Af Amer: 28 mL/min — ABNORMAL LOW (ref >60)
Glucose, Bld: 150 mg/dL — ABNORMAL HIGH (ref 70–99)
Potassium: 5.3 mmol/L — ABNORMAL HIGH (ref 3.5–5.1)
Sodium: 144 mmol/L (ref 135–145)

## 2018-05-17 LAB — GLUCOSE, CAPILLARY
Glucose-Capillary: 112 mg/dL — ABNORMAL HIGH (ref 70–99)
Glucose-Capillary: 168 mg/dL — ABNORMAL HIGH (ref 70–99)
Glucose-Capillary: 103 mg/dL — ABNORMAL HIGH (ref 70–99)
Glucose-Capillary: 128 mg/dL — ABNORMAL HIGH (ref 70–99)

## 2018-05-17 LAB — PROTEIN / CREATININE RATIO, URINE
Creatinine, Urine: 46.39 mg/dL
Protein Creatinine Ratio: 2.39 mg/mg{Cre} — ABNORMAL HIGH (ref 0.00–0.15)
Total Protein, Urine: 111 mg/dL

## 2018-05-17 MED ORDER — FUROSEMIDE 10 MG/ML IJ SOLN
80.0000 mg | Freq: Once | INTRAMUSCULAR | Status: AC
  Administered 2018-05-17: 80 mg via INTRAVENOUS
  Filled 2018-05-17: qty 8

## 2018-05-17 MED ORDER — HYDRALAZINE HCL 50 MG PO TABS
50.0000 mg | ORAL_TABLET | Freq: Three times a day (TID) | ORAL | Status: DC
  Administered 2018-05-17 – 2018-05-21 (×12): 50 mg via ORAL
  Filled 2018-05-17 (×12): qty 1

## 2018-05-17 MED ORDER — FUROSEMIDE 10 MG/ML IJ SOLN
80.0000 mg | Freq: Once | INTRAMUSCULAR | Status: DC
  Filled 2018-05-17: qty 8

## 2018-05-17 MED ORDER — ALBUMIN HUMAN 25 % IV SOLN
25.0000 g | Freq: Once | INTRAVENOUS | Status: DC
  Filled 2018-05-17: qty 100

## 2018-05-17 MED ORDER — ALBUTEROL SULFATE (2.5 MG/3ML) 0.083% IN NEBU: 2.5000 mg | INHALATION_SOLUTION | RESPIRATORY_TRACT | Status: DC | PRN

## 2018-05-17 MED ORDER — ALBUMIN HUMAN 25 % IV SOLN
25.0000 g | Freq: Once | INTRAVENOUS | Status: AC
  Administered 2018-05-18: 25 g via INTRAVENOUS
  Filled 2018-05-17: qty 100

## 2018-05-17 MED ORDER — MORPHINE SULFATE ER 15 MG PO TBCR
15.0000 mg | EXTENDED_RELEASE_TABLET | Freq: Two times a day (BID) | ORAL | Status: DC
  Administered 2018-05-18 – 2018-05-21 (×7): 15 mg via ORAL
  Filled 2018-05-17 (×7): qty 1

## 2018-05-17 NOTE — Progress Notes (Signed)
PROGRESS NOTE    Andrea [name]  MWU:[id number] DOB: [birth year] DOA: 05/07/2018 PCP: [facility]   Brief Narrative:  68 year old female past medical history of diastolic CHF, three-vessel CAD status post PCI, diabetes mellitus and stage III-4 CKD who presented to the emergency room on 4/16 with complaints of shortness of breath as well as weakness, unable to pick herself up off the floor. She had been just discharged 10 days PTA for a multi lobar pneumonia from RSV plus CHF exacerbation. She had just left her skilled nursing facility after discharge 3 days before coming in to the back to the emergency room. COVID ruled out. Patient found to be in significant volume overload with 30 pound weight gain. Also complained of right buttock pain where she is found to have an area of induration with fluctuance.  Patient was admitted, being diuresed, creatinine fluctuating, still appears fluid overloaded.  On diuretics.  Underwent debridement of left buttock abscess 4/22. Ongoing aki and fluid overload- nephro consult 4/25   Subjective: C/o not being able to get up, has gen weakness, no nausea, vomiting, fever. Temp low. Hb at 7.1, had good uop w 80 mg lasix . Asking what is wrong with her.  Assessment & Plan:   Acute on chronic diastolic congestive heart failure, echo 02/2018 w 55-60% lvef and previously 30-35% in 12.2019. Good  UOP w 8 mg lasix overnight.Wt 177 lb 4/17-->185.7 lb->184.6 lb. Stopped lasix 40 mg bid. Still w leg edema but better from before. Cont as per nephro  Bilateral lung infiltrates, negative for COVID-19.  RSV was positive on 4/6 and 3/21, concern for ongoing RSV viral shedding.  Is afebrile, WBC count 4700.  cont droplet precaution.  Continue pulmonary toileting Brovana, Pulmicort inhalers.  Hyperkalemia: Potassium still up has been for few months. On Lokelma 10 gm daily. Per nephro Recent Labs  Lab 05/13/18 0427 05/14/18 0450 05/15/18 0604  05/16/18 0325 05/17/18 0351  K 5.1 5.3* 5.9* 5.5* 5.3*   CKD  stage IV with AKI, remains fluid overloaded needing diuresis.  Will have nephrology input for diuretics with fluctuation in the renal function Recent Labs  Lab 05/13/18 0427 05/14/18 0450 05/15/18 0604 05/16/18 0325 05/17/18 0351  CREATININE 1.45* 1.86* 1.87* 2.14* 1.86*   Cellulitis and abscess of buttock: Status post incision and drainage and sharp debridement of 2 abscesses of the left buttock 4/22, continue dressing, sitz bath.Currently no active bleeding.  Drain in place.  Surgery following.  Continue on doxycycline.  She is afebrile- but low temp overnight, wbc stable  Anemia, multifactorial secondary to anemia of chronic disease, blood loss from episode of bleeding from the wound on the buttock. s/p 1 unit prbc 4/25- hb is up. Monitor as below. Transfuse if <7 gm  Off lovenox, unable to stop plavix due to stent 12/2017 Recent Labs  Lab 05/14/18 0450 05/15/18 0604 05/16/18 0325 05/16/18 1452 05/17/18 0351  HGB 7.9* 6.1* 5.9* 7.9* 7.1*  HCT 25.6* 19.3* 18.6* 24.4* 22.8*   CAD S/P PCI of proximal and distal LAD w DES X2 on  01/09/2018/Essential hypertension : No chest pain.  BP is stable.  Continue her Lipitor, Plavix, Coreg, Imdur, hydralazine.  FTT/moderate protein calorie malnutrition Albumin at 1.8.  Augment nutrition.  Insulin dependent diabetes mellitus hba1c is 6.4, 2/23. Stable.  Continue CBG checks and sliding scale insulin and Lantus. Recent Labs  Lab 05/16/18 0623 05/16/18 1236 05/16/18 1639 05/16/18 2044 05/17/18 0732  GLUCAP 100* 91 156* 153* 112*  Hypothyroidism: Continue home Synthroid.  Seizure disorder/GAD: Stable continue Depakote, BuSpar.  Chronic pain syndrome, continue current regimen.  Left labial redness , appears to be draining, continue heart compression.  Debility physical deconditioning.  Continue PT OT.  Patient declining to return to skilled nursing facility, may be open  to new SNF.  She lives alone and is high risk for decompensation and readmission   DVT prophylaxis: SCD Lovenox stopped due ot bleeding Code Status: FULL  Family Communication: discussed plan of care with the patient in detail explained her her current active issues.  She seems to be overwhelmed.  I called son [name] to update and discuss, but no answer, per pt he does not pick up phone. I spent several minutes explaining multiple issues that are ongoing with her. Encouraged PT/OT  Disposition Plan: remains inpatient pending clinical improvement.    Consultants:  Surgery Nephrology  Procedures:  CT chest 05/07/2018  CXR 05/07/2018  Lower extremity Dopplers 05/09/2018  [place] Left buttocks 05/10/2018  Antimicrobials:  Augmentin 05/08/2018>>>> 05/10/2018  Oral doxycycline 05/07/2018>>>>  IV Rocephin 05/07/2018>>>> 05/08/2018  Anti-infectives (From admission, onward)   Start     Dose/Rate Route Frequency Ordered Stop   05/15/18 0400  ceFAZolin (ANCEF) IVPB 2g/100 mL premix     2 g 200 mL/hr over 30 Minutes Intravenous To [facility] 05/15/18 0343 05/16/18 0400   05/08/18 2100  amoxicillin-clavulanate (AUGMENTIN) 500-125 MG per tablet 500 mg  Status:  Discontinued     1 tablet Oral 2 times daily 05/08/18 1524 05/10/18 1546   05/07/18 2200  doxycycline (VIBRA-TABS) tablet 100 mg     100 mg Oral Every 12 hours 05/07/18 2009     05/07/18 2015  cefTRIAXone (ROCEPHIN) 1 g in sodium chloride 0.9 % 100 mL IVPB  Status:  Discontinued     1 g 200 mL/hr over 30 Minutes Intravenous Every 24 hours 05/07/18 2009 05/08/18 1524       Objective: Vitals:   05/17/18 0718 05/17/18 0721 05/17/18 0722 05/17/18 0730  BP:    (!) 149/68  Pulse:    70  Resp:      Temp:      TempSrc:      SpO2: 95% 95% 100%   Weight:      Height:        Intake/Output Summary (Last 24 hours) at 05/17/2018 0828 Last data filed at 05/17/2018 0730 Gross per 24 hour  Intake 1416.98 ml  Output 1775 ml   Net -358.02 ml   Filed Weights   05/15/18 0637 05/16/18 0625 05/17/18 0406  Weight: 84 kg 84.2 kg 83.7 kg   Weight change: -0.499 kg  Body mass index is 31.69 kg/m.  Intake/Output from previous day: 04/25 0701 - 04/26 0700 In: 1177 [P.O.:720; I.V.:20; Blood:354; IV Piggyback:83] Out: 2458 [KDXIP:3825] Intake/Output this shift: Total I/O In: 240 [P.O.:240] Out: -   Examination: General exam: sick looking, alert, awake, not in acute distress, older for age.  HEENT:Oral mucosa moist, Ear/Nose WNL grossly, dentition normal. Respiratory system: Bilateral equal air entry, no crackles and wheezing, no use of accessory muscle, non tender on palpation. Cardiovascular system: regular rate and rhythm, S1 & S2 heard, No JVD/murmurs. Gastrointestinal system: Abdomen soft, non-tender, non-distended, BS +. No hepatosplenomegaly palpable. Nervous System:Alert, awake and oriented at baseline. Able to move UE and LE, sensation intact. Extremities: b/l leg edema, distal peripheral pulses palpable.  Skin: No rashes,no icterus. MSK: Normal muscle bulk,tone, power Left buttock wound w drain in place- no  bleeding  Medications:  Scheduled Meds: . arformoterol  15 mcg Nebulization BID  . atorvastatin  80 mg Oral QHS  . bisacodyl  10 mg Rectal Once  . budesonide  0.5 mg Nebulization BID  . busPIRone  15 mg Oral BID  . carvedilol  25 mg Oral BID WC  . Chlorhexidine Gluconate Cloth  6 each Topical Q0600  . Chlorhexidine Gluconate Cloth  6 each Topical Once  . clopidogrel  75 mg Oral Daily  . divalproex  1,000 mg Oral Daily  . doxycycline  100 mg Oral Q12H  . folic acid  1 mg Oral Daily  . hydrALAZINE  100 mg Oral Q8H  . insulin aspart  0-9 Units Subcutaneous TID WC  . insulin glargine  8 Units Subcutaneous QHS  . ipratropium-albuterol  3 mL Nebulization BID  . isosorbide mononitrate  30 mg Oral Daily  . levothyroxine  200 mcg Oral QAC breakfast  . mouth rinse  15 mL Mouth Rinse BID  .  Melatonin  3 mg Oral QHS  . morphine  15 mg Oral BID  . multivitamin with minerals  1 tablet Oral Q1200  . nutrition supplement (JUVEN)  1 packet Oral BID BM  . pantoprazole  40 mg Oral Daily  . polyethylene glycol  17 g Oral Daily  . pregabalin  75 mg Oral BID  . senna-docusate  2 tablet Oral BID  . sodium chloride flush  3 mL Intravenous Q12H  . sodium zirconium cyclosilicate  10 g Oral Daily   Continuous Infusions: . sodium chloride Stopped (05/08/18 0231)    Data Reviewed: I have personally reviewed following labs and imaging studies  CBC: Recent Labs  Lab 05/13/18 0427 05/14/18 0450 05/15/18 0604 05/16/18 0325 05/16/18 1452 05/17/18 0351  WBC 5.6 5.6 6.5 4.7 5.1 3.8*  NEUTROABS 3.2  --   --   --   --   --   HGB 7.8* 7.9* 6.1* 5.9* 7.9* 7.1*  HCT 24.7* 25.6* 19.3* 18.6* 24.4* 22.8*  MCV 95.7 95.5 96.5 95.4 91.7 92.3  PLT 105* 110* 109* 98* 106* 98*   Basic Metabolic Panel: Recent Labs  Lab 05/13/18 0427 05/14/18 0450 05/15/18 0604 05/16/18 0325 05/17/18 0351  NA 147* 145 142 144 144  K 5.1 5.3* 5.9* 5.5* 5.3*  CL 104 102 100 104 101  CO2 34* 34* 33* 33* 33*  GLUCOSE 110* 187* 149* 133* 150*  BUN 78* 83* 91* 95* 96*  CREATININE 1.45* 1.86* 1.87* 2.14* 1.86*  CALCIUM 8.3* 8.4* 8.1* 8.0* 8.3*   GFR: Estimated Creatinine Clearance: 30.7 mL/min (A) (by C-G formula based on SCr of 1.86 mg/dL (H)). Liver Function Tests: Recent Labs  Lab 05/16/18 0325  AST 37  ALT 14  ALKPHOS 41  BILITOT 0.5  PROT 4.3*  ALBUMIN 1.8*   No results for input(s): LIPASE, AMYLASE in the last 168 hours. No results for input(s): AMMONIA in the last 168 hours. Coagulation Profile: No results for input(s): INR, PROTIME in the last 168 hours. Cardiac Enzymes: No results for input(s): CKTOTAL, CKMB, CKMBINDEX, TROPONINI in the last 168 hours. BNP (last 3 results) No results for input(s): PROBNP in the last 8760 hours. HbA1C: No results for input(s): HGBA1C in the last 72  hours. CBG: Recent Labs  Lab 05/16/18 0623 05/16/18 1236 05/16/18 1639 05/16/18 2044 05/17/18 0732  GLUCAP 100* 91 156* 153* 112*   Lipid Profile: No results for input(s): CHOL, HDL, LDLCALC, TRIG, CHOLHDL, LDLDIRECT in the last 72 hours.  Thyroid Function Tests: No results for input(s): TSH, T4TOTAL, FREET4, T3FREE, THYROIDAB in the last 72 hours. Anemia Panel: No results for input(s): VITAMINB12, FOLATE, FERRITIN, TIBC, IRON, RETICCTPCT in the last 72 hours. Sepsis Labs: No results for input(s): PROCALCITON, LATICACIDVEN in the last 168 hours.  Recent Results (from the past 240 hour(s))  SARS Coronavirus 2 [facility] order, Performed in [place] hospital lab)     Status: None   Collection Time: 05/07/18  3:58 PM  Result Value Ref Range Status   SARS Coronavirus 2 NEGATIVE NEGATIVE Final    Comment: (NOTE) If result is NEGATIVE SARS-CoV-2 target nucleic acids are NOT DETECTED. The SARS-CoV-2 RNA is generally detectable in upper and lower  respiratory specimens during the acute phase of infection. The lowest  concentration of SARS-CoV-2 viral copies this assay can detect is 250  copies / mL. A negative result does not preclude SARS-CoV-2 infection  and should not be used as the sole basis for treatment or other  patient management decisions.  A negative result may occur with  improper specimen collection / handling, submission of specimen other  than nasopharyngeal swab, presence of viral mutation(s) within the  areas targeted by this assay, and inadequate number of viral copies  (<250 copies / mL). A negative result must be combined with clinical  observations, patient history, and epidemiological information. If result is POSITIVE SARS-CoV-2 target nucleic acids are DETECTED. The SARS-CoV-2 RNA is generally detectable in upper and lower  respiratory specimens dur ing the acute phase of infection.  Positive  results are indicative of active infection with SARS-CoV-2.   Clinical  correlation with patient history and other diagnostic information is  necessary to determine patient infection status.  Positive results do  not rule out bacterial infection or co-infection with other viruses. If result is PRESUMPTIVE POSTIVE SARS-CoV-2 nucleic acids MAY BE PRESENT.   A presumptive positive result was obtained on the submitted specimen  and confirmed on repeat testing.  While 2019 novel coronavirus  (SARS-CoV-2) nucleic acids may be present in the submitted sample  additional confirmatory testing may be necessary for epidemiological  and / or clinical management purposes  to differentiate between  SARS-CoV-2 and other Sarbecovirus currently known to infect humans.  If clinically indicated additional testing with an alternate test  methodology [phone]) is advised. The SARS-CoV-2 RNA is generally  detectable in upper and lower respiratory sp ecimens during the acute  phase of infection. The expected result is Negative. Fact Sheet for Patients:  StrictlyIdeas.no Fact Sheet for Healthcare Providers: BankingDealers.co.za This test is not yet approved or cleared by the [place] FDA and has been authorized for detection and/or diagnosis of SARS-CoV-2 by FDA under an Emergency Use Authorization (EUA).  This EUA will remain in effect (meaning this test can be used) for the duration of the COVID-19 declaration under Section 564(b)(1) of the Act, 21 U.S.C. section 360bbb-3(b)(1), unless the authorization is terminated or revoked sooner. Performed at [facility], [place] [address] [place], [place] [address]   Respiratory Panel by PCR     Status: Abnormal   Collection Time: 05/07/18  3:58 PM  Result Value Ref Range Status   Adenovirus NOT DETECTED NOT DETECTED Final   Coronavirus 229E NOT DETECTED NOT DETECTED Final    Comment: (NOTE) The Coronavirus on the Respiratory Panel, DOES NOT test for the novel   Coronavirus (2019 nCoV)    Coronavirus HKU1 NOT DETECTED NOT DETECTED Final   Coronavirus NL63 NOT DETECTED  NOT DETECTED Final   Coronavirus OC43 NOT DETECTED NOT DETECTED Final   Metapneumovirus NOT DETECTED NOT DETECTED Final   Rhinovirus / Enterovirus NOT DETECTED NOT DETECTED Final   Influenza A NOT DETECTED NOT DETECTED Final   Influenza B NOT DETECTED NOT DETECTED Final   Parainfluenza Virus 1 NOT DETECTED NOT DETECTED Final   Parainfluenza Virus 2 NOT DETECTED NOT DETECTED Final   Parainfluenza Virus 3 NOT DETECTED NOT DETECTED Final   Parainfluenza Virus 4 NOT DETECTED NOT DETECTED Final   Respiratory Syncytial Virus DETECTED (A) NOT DETECTED Final    Comment: CRITICAL RESULT CALLED TO, READ BACK BY AND VERIFIED WITH: A.ROBERTS,RN 0457 05/08/18 G.MCADOO    Bordetella pertussis NOT DETECTED NOT DETECTED Final   Chlamydophila pneumoniae NOT DETECTED NOT DETECTED Final   Mycoplasma pneumoniae NOT DETECTED NOT DETECTED Final    Comment: Performed at [facility], [place] [address] [place], [place] [address]  MRSA PCR Screening     Status: Abnormal   Collection Time: 05/08/18  5:58 AM  Result Value Ref Range Status   MRSA by PCR POSITIVE (A) NEGATIVE Final    Comment:        The GeneXpert MRSA Assay (FDA approved for NASAL specimens only), is one component of a comprehensive MRSA colonization surveillance program. It is not intended to diagnose MRSA infection nor to guide or monitor treatment for MRSA infections. RESULT CALLED TO, READ BACK BY AND VERIFIED WITH: Lorin Mercy RN, AT [phone] 05/08/18 BY Rush [facility]mark Performed at [facility], [place] [address], [place], [place] [address]   Expectorated sputum assessment w rflx to resp cult     Status: None   Collection Time: 05/09/18  5:34 AM  Result Value Ref Range Status   Specimen Description SPUTUM  Final   Special Requests NONE  Final   Sputum evaluation   Final    Sputum specimen not acceptable for testing.  Please  recollect.   RESULT CALLED TO, READ BACK BY AND VERIFIED WITH: RN Madilyn Fireman [phone] FCP Performed at [facility] [address], [place], [place] [address]    Report Status 05/09/2018 FINAL  Final  Surgical pcr screen     Status: Abnormal   Collection Time: 05/13/18  4:53 AM  Result Value Ref Range Status   MRSA, PCR POSITIVE (A) NEGATIVE Final   Staphylococcus aureus POSITIVE (A) NEGATIVE Final    Comment: CRITICAL RESULT CALLED TO, READ BACK BY AND VERIFIED WITH: H.HAMLIN AT 0711 ON [id number] BY SJW (NOTE) The Xpert SA Assay (FDA approved for NASAL specimens in patients 62 years of age and older), is one component of a comprehensive surveillance program. It is not intended to diagnose infection nor to guide or monitor treatment. Performed at [facility], [place] [address], [place], [place] [address]   Aerobic/Anaerobic Culture (surgical/deep wound)     Status: None (Preliminary result)   Collection Time: 05/13/18 10:27 AM  Result Value Ref Range Status   Specimen Description ABSCESS GLUTEAL  Final   Special Requests PATIENT ON FOLLOWING ANCEF  Final   Gram Stain   Final    RARE WBC PRESENT, PREDOMINANTLY PMN NO ORGANISMS SEEN Performed at [facility], [place] [address], [place], [place] [address]    Culture   Final    FEW METHICILLIN RESISTANT STAPHYLOCOCCUS AUREUS NO ANAEROBES ISOLATED; CULTURE IN PROGRESS FOR 5 DAYS    Report Status PENDING  Incomplete   Organism ID,  Bacteria METHICILLIN RESISTANT STAPHYLOCOCCUS AUREUS  Final      Susceptibility   Methicillin resistant staphylococcus aureus - MIC*    CIPROFLOXACIN >=8 RESISTANT Resistant     ERYTHROMYCIN >=8 RESISTANT Resistant     GENTAMICIN <=0.5 SENSITIVE Sensitive     OXACILLIN >=4 RESISTANT Resistant     TETRACYCLINE <=1 SENSITIVE Sensitive     VANCOMYCIN 1 SENSITIVE Sensitive     TRIMETH/SULFA <=10 SENSITIVE Sensitive     CLINDAMYCIN <=0.25 SENSITIVE Sensitive     RIFAMPIN <=0.5 SENSITIVE  Sensitive     Inducible Clindamycin NEGATIVE Sensitive     * FEW METHICILLIN RESISTANT STAPHYLOCOCCUS AUREUS      Radiology Studies: No results found.    LOS: 10 days   Time spent: More than 50% of that time was spent in counseling and/or coordination of care.  Antonieta Pert, MD [facility]  05/17/2018, 8:28 AM

## 2018-05-17 NOTE — Progress Notes (Signed)
Patient c/o dizziness and feeling fainting once  awhile, v/s table., MD notified see epic for new orders. Patient is in bed resting comfortably now. Will continue to monitor the patient.

## 2018-05-17 NOTE — Progress Notes (Signed)
[facility] Progress Note  Name: Andrea [name] MRN: [id number] DOB: [birth year]  Chief Complaint:  Shortness of breath   Subjective:  Pt with 1.8 liters UOP over 4/25 in addition to unquantified voids.  She feels a little less swollen today and is open to wearing the compression devices in her room tonight.   Review of systems:  Still short of breath with exertion  Legs are sore  No chest pain  ------------------------ Background on consult:  Andrea [name] is a 68 y.o. female with a history of CKD, diabetes, hypertension, and CHF who presented to the hospital with shortness of breath and weakness.  She has been treated for CHF exacerbation.  Note that she has a recent admission with RSV pneumonia and after that point was discharged to a SNF.  She signed out against medical advice because she believed she was strong enough to go home but when she got home she was overwhelmed and not able to care for herself.  She is reported to have had a gain of over 30 lbs. she reports that she is much more swollen now than normal.  She states that she has been on Lasix since her heart cath.  She has shortness of breath with exertion especially when she ambulates and when she tries to talk while walking it is even worse.  Nephrology is consulted for assistance with diuretic management.  On review, her baseline creatinine has been approximately 2 since 12/2017.    Intake/Output Summary (Last 24 hours) at 05/17/2018 1707 Last data filed at 05/17/2018 1623 Gross per 24 hour  Intake 1042.98 ml  Output 1625 ml  Net -582.02 ml    Vitals:  Vitals:   05/17/18 0730 05/17/18 1110 05/17/18 1208 05/17/18 1458  BP: (!) 149/68 (!) 158/62 (!) 143/59   Pulse: 70 69 70   Resp:   20   Temp:  (!) 97.1 F (36.2 C)  (!) 94.9 F (34.9 C)  TempSrc:  Oral  Rectal  SpO2:  100% 90%   Weight:      Height:         Physical Exam:  General adult female seated chronically ill  appearing HEENT normocephalic atraumatic extraocular movements intact sclera anicteric Neck supple trachea midline Lungs reduced breath sounds and increased work of breathing with exertion  Heart RRR; no rubs Abdomen soft nontender obese habitus  Extremities 2-3+ edema bilateral lower extremities  Neuro: alert and oriented x 3; follows commands and provides history GU: no foley    Medications reviewed   Labs:  BMP Latest Ref Rng & Units 05/17/2018 05/16/2018 05/15/2018  Glucose 70 - 99 mg/dL 150(H) 133(H) 149(H)  BUN 8 - 23 mg/dL 96(H) 95(H) 91(H)  Creatinine 0.44 - 1.00 mg/dL 1.86(H) 2.14(H) 1.87(H)  BUN/Creat Ratio 12 - 28 - - -  Sodium 135 - 145 mmol/L 144 144 142  Potassium 3.5 - 5.1 mmol/L 5.3(H) 5.5(H) 5.9(H)  Chloride 98 - 111 mmol/L 101 104 100  CO2 22 - 32 mmol/L 33(H) 33(H) 33(H)  Calcium 8.9 - 10.3 mg/dL 8.3(L) 8.0(L) 8.1(L)     Assessment/Plan:   # CKD stage IV  - felt secondary to diabetic nephropathy as well as microvascular disease from HTN and pre-renal insults in the setting of CHF  - Improving with supportive care  - Given renal function would consider an alternative to buspirone and morphine if needed   # Acute on chronic diastolic CHF (note EF 02/7739 was 30-35% and normal in 02/2018)  -  Lasix/albumin IV once now and in AM then reassess dosing  - Apply SCD's  - strict ins/outs  # Acute hypoxic respiratory failure  - 2/2 CHF - Supplemental oxygen as needed  - Diurese as above   # Hyperkalemia  - On lokelma and diuretics as above.  Frequently hyperkalemic since 02/2018 - Lasix once now as above - reassess lokelma 4/27 as K downtrending   # Proteinuria nephrotic range  - Note hematuria and proteinuria  - Per last renal consult work-up has been unremarkable including HIV, RPR, complements normal, HB and HCV serologies neg. SPEP nl. SFLC c/w CKD. UPEPneg. AntiPLA2Reng. - Biopsy previously deferred  - would defer RAAS blockade for now with hx  concurrent hyperkalemia  # HTN  - reduce hydralazine to allow diuresis  # Acute normocytic anemia  - s/p PRBC's  - note marked azotemia throughout recent days   Claudia Desanctis, MD 05/17/2018 5:07 PM

## 2018-05-17 NOTE — Progress Notes (Signed)
Progress Note: General Surgery Service   Assessment/Plan: Principal Problem:   Acute on chronic diastolic congestive heart failure (HCC) Active Problems:   Essential hypertension   Insulin dependent diabetes mellitus (HCC)   Hypothyroidism   History of seizures   Chronic pain   CAD S/P percutaneous coronary angioplasty   Seizure (HCC)   Anemia   GAD (generalized anxiety disorder)   Hyperkalemia   CKD (chronic kidney disease), stage III (HCC)   Bilateral leg edema   FTT (failure to thrive) in adult   Pressure injury of skin   Cellulitis and abscess of buttock  s/p Procedure(s): IRRIGATION AND DEBRIDEMENT  OF TWO GLUTEAL ABSCESSES 05/13/2018  Subcutaneous abscess (4 x 3 x 3 cm and 2 x 2 x 2 cm) left buttock S/PIncision, drainage, and sharp debridement of 2 subcutaneous abscesses of the left buttock, 05/13/18, Dr. Donnie Mesa POD #4 -continue dressing care, start sitz baths today  ABLA- Hgb 5.9 -> 7.1 today no active bleeding on exam AKI- Cr improving  ID -rocephin 4/16>>4/17, augmentin 4/17>>19, doxycycline 4/16>>day9 VTE -SCDs, lovenox- on Plavix also FEN -carb-modified diet post-op Foley -none Follow up -TBD    LOS: 10 days  Chief Complaint/Subjective: Wants to know when she can go home, wants to know what is happening to her bottom, tolerating diet  Objective: Vital signs in last 24 hours: Temp:  [94.5 F (34.7 C)-98.7 F (37.1 C)] 96.8 F (36 C) (04/26 0406) Pulse Rate:  [68-72] 70 (04/26 0730) Resp:  [18-20] 20 (04/26 0413) BP: (140-159)/(58-79) 149/68 (04/26 0730) SpO2:  [95 %-100 %] 100 % (04/26 0722) Weight:  [83.7 kg] 83.7 kg (04/26 0406) Last BM Date: 05/16/18  Intake/Output from previous day: 04/25 0701 - 04/26 0700 In: 1177 [P.O.:720; I.V.:20; Blood:354; IV Piggyback:83] Out: 8850 [Urine:1775] Intake/Output this shift: Total I/O In: 240 [P.O.:240] Out: -   Lungs: nonlabored  Cardiovascular: RRR  Abd: soft  Back: left  gluteal incision with drain in place, no active bleeding  Extremities: no edema  Neuro: AOx4  Lab Results: CBC  Recent Labs    05/16/18 1452 05/17/18 0351  WBC 5.1 3.8*  HGB 7.9* 7.1*  HCT 24.4* 22.8*  PLT 106* 98*   BMET Recent Labs    05/16/18 0325 05/17/18 0351  NA 144 144  K 5.5* 5.3*  CL 104 101  CO2 33* 33*  GLUCOSE 133* 150*  BUN 95* 96*  CREATININE 2.14* 1.86*  CALCIUM 8.0* 8.3*   PT/INR No results for input(s): LABPROT, INR in the last 72 hours. ABG No results for input(s): PHART, HCO3 in the last 72 hours.  Invalid input(s): PCO2, PO2  Studies/Results:  Anti-infectives: Anti-infectives (From admission, onward)   Start     Dose/Rate Route Frequency Ordered Stop   05/15/18 0400  ceFAZolin (ANCEF) IVPB 2g/100 mL premix     2 g 200 mL/hr over 30 Minutes Intravenous To ShortStay Surgical 05/15/18 0343 05/16/18 0400   05/08/18 2100  amoxicillin-clavulanate (AUGMENTIN) 500-125 MG per tablet 500 mg  Status:  Discontinued     1 tablet Oral 2 times daily 05/08/18 1524 05/10/18 1546   05/07/18 2200  doxycycline (VIBRA-TABS) tablet 100 mg     100 mg Oral Every 12 hours 05/07/18 2009     05/07/18 2015  cefTRIAXone (ROCEPHIN) 1 g in sodium chloride 0.9 % 100 mL IVPB  Status:  Discontinued     1 g 200 mL/hr over 30 Minutes Intravenous Every 24 hours 05/07/18 2009 05/08/18 1524  Medications: Scheduled Meds: . arformoterol  15 mcg Nebulization BID  . atorvastatin  80 mg Oral QHS  . bisacodyl  10 mg Rectal Once  . budesonide  0.5 mg Nebulization BID  . busPIRone  15 mg Oral BID  . carvedilol  25 mg Oral BID WC  . Chlorhexidine Gluconate Cloth  6 each Topical Q0600  . Chlorhexidine Gluconate Cloth  6 each Topical Once  . clopidogrel  75 mg Oral Daily  . divalproex  1,000 mg Oral Daily  . doxycycline  100 mg Oral Q12H  . folic acid  1 mg Oral Daily  . hydrALAZINE  100 mg Oral Q8H  . insulin aspart  0-9 Units Subcutaneous TID WC  . insulin glargine  8  Units Subcutaneous QHS  . ipratropium-albuterol  3 mL Nebulization BID  . isosorbide mononitrate  30 mg Oral Daily  . levothyroxine  200 mcg Oral QAC breakfast  . mouth rinse  15 mL Mouth Rinse BID  . Melatonin  3 mg Oral QHS  . morphine  15 mg Oral BID  . multivitamin with minerals  1 tablet Oral Q1200  . nutrition supplement (JUVEN)  1 packet Oral BID BM  . pantoprazole  40 mg Oral Daily  . polyethylene glycol  17 g Oral Daily  . pregabalin  75 mg Oral BID  . senna-docusate  2 tablet Oral BID  . sodium chloride flush  3 mL Intravenous Q12H  . sodium zirconium cyclosilicate  10 g Oral Daily   Continuous Infusions: . sodium chloride Stopped (05/08/18 0231)   PRN Meds:.sodium chloride, 0.9 % irrigation (POUR BTL), acetaminophen **OR** acetaminophen, barrier cream, clonazePAM, oxyCODONE, promethazine, silver nitrate applicators  Mickeal Skinner, MD [facility]

## 2018-05-17 NOTE — Progress Notes (Signed)
Temperature still low (slowly coming up, patient states she is sweating after more blankets were placed.

## 2018-05-18 LAB — GLUCOSE, CAPILLARY
Glucose-Capillary: 127 mg/dL — ABNORMAL HIGH (ref 70–99)
Glucose-Capillary: 58 mg/dL — ABNORMAL LOW (ref 70–99)
Glucose-Capillary: 86 mg/dL (ref 70–99)
Glucose-Capillary: 95 mg/dL (ref 70–99)
Glucose-Capillary: 82 mg/dL (ref 70–99)

## 2018-05-18 LAB — CBC
HCT: 22.1 % — ABNORMAL LOW (ref 36.0–46.0)
Hemoglobin: 6.9 g/dL — CL (ref 12.0–15.0)
MCH: 29.2 pg (ref 26.0–34.0)
MCHC: 31.2 g/dL (ref 30.0–36.0)
MCV: 93.6 fL (ref 80.0–100.0)
Platelets: 91 10*3/uL — ABNORMAL LOW (ref 150–400)
RBC: 2.36 MIL/uL — ABNORMAL LOW (ref 3.87–5.11)
RDW: 16.6 % — ABNORMAL HIGH (ref 11.5–15.5)
WBC: 4.6 10*3/uL (ref 4.0–10.5)
nRBC: 0 % (ref 0.0–0.2)

## 2018-05-18 LAB — BASIC METABOLIC PANEL
Anion gap: 5 (ref 5–15)
BUN: 104 mg/dL — ABNORMAL HIGH (ref 8–23)
CO2: 36 mmol/L — ABNORMAL HIGH (ref 22–32)
Calcium: 8.3 mg/dL — ABNORMAL LOW (ref 8.9–10.3)
Chloride: 103 mmol/L (ref 98–111)
Creatinine, Ser: 1.85 mg/dL — ABNORMAL HIGH (ref 0.44–1.00)
GFR calc Af Amer: 32 mL/min — ABNORMAL LOW (ref >60)
GFR calc non Af Amer: 28 mL/min — ABNORMAL LOW (ref >60)
Glucose, Bld: 106 mg/dL — ABNORMAL HIGH (ref 70–99)
Potassium: 5.1 mmol/L (ref 3.5–5.1)
Sodium: 144 mmol/L (ref 135–145)

## 2018-05-18 LAB — AEROBIC/ANAEROBIC CULTURE W GRAM STAIN (SURGICAL/DEEP WOUND)

## 2018-05-18 LAB — PREPARE RBC (CROSSMATCH)

## 2018-05-18 MED ORDER — FUROSEMIDE 10 MG/ML IJ SOLN
80.0000 mg | Freq: Two times a day (BID) | INTRAMUSCULAR | Status: DC
  Administered 2018-05-18 – 2018-05-19 (×2): 80 mg via INTRAVENOUS
  Filled 2018-05-18 (×2): qty 8

## 2018-05-18 MED ORDER — DARBEPOETIN ALFA 60 MCG/0.3ML IJ SOSY
60.0000 ug | PREFILLED_SYRINGE | Freq: Once | INTRAMUSCULAR | Status: AC
  Administered 2018-05-18: 60 ug via SUBCUTANEOUS
  Filled 2018-05-18: qty 0.3

## 2018-05-18 MED ORDER — FUROSEMIDE 10 MG/ML IJ SOLN
80.0000 mg | Freq: Two times a day (BID) | INTRAMUSCULAR | Status: DC
  Administered 2018-05-18: 80 mg via INTRAVENOUS

## 2018-05-18 MED ORDER — ALBUMIN HUMAN 25 % IV SOLN
25.0000 g | Freq: Once | INTRAVENOUS | Status: AC
  Administered 2018-05-18: 25 g via INTRAVENOUS
  Filled 2018-05-18: qty 100

## 2018-05-18 MED ORDER — SODIUM CHLORIDE 0.9% IV SOLUTION
Freq: Once | INTRAVENOUS | Status: AC
  Administered 2018-05-18: 12:00:00 via INTRAVENOUS

## 2018-05-18 NOTE — Progress Notes (Signed)
Pt iv started by iv team last night, it was placed in right wrist, pt says it hurts when she moves her arm and wants to see if she can get a new one esp if she getting blood, iv team consult placed per pt request, spoke to iv team nurse who suggests other options as pt is needing frequent ivs and limited access, states they are unable to place midline or picc due to ckd, message sent to Dr Lupita Leash and he said he will assess

## 2018-05-18 NOTE — Progress Notes (Signed)
Occupational Therapy Treatment Patient Details Name: Andrea [name] MRN: [id number] DOB: 05-Dec-1950 Today's Date: 05/18/2018    History of present illness 68 yo admitted with SOB and weakness from home after leaving SNF 4/13. Pt with CHF and PNA. Pt D/C from hospital to [place] on 4/5. PMhx: CHF, CAD, breast CA, bil mastectomy, HTn, seizure, migraine, IDDM, CKD   OT comments  Pt progressing towards OT goals this session. Agreeable to HEP for BUE at bed level due to pain in sacrum. See below for HEP. Pt continues to be eager to work with therapies. OT will continue to see acutely. Pt set up at bed level with HOB elevated for eating dinner at end of session.    Follow Up Recommendations  SNF;Home health OT    Equipment Recommendations  None recommended by OT    Recommendations for Other Services      Precautions / Restrictions Precautions Precautions: Fall Precaution Comments: watch 02       Mobility Bed Mobility                  Transfers                      Balance                                           ADL either performed or assessed with clinical judgement   ADL Overall ADL's : [name] assistance/impaired Eating/Feeding: Set up;Sitting;Bed level Eating/Feeding Details (indicate cue type and reason): at end of session                                         Vision       Perception     Praxis      Cognition Arousal/Alertness: Awake/alert Behavior During Therapy: WFL for tasks assessed/performed Overall Cognitive Status: No family/caregiver present to determine baseline cognitive functioning Area of Impairment: Safety/judgement;Awareness;Memory                     Memory: Decreased short-term memory   Safety/Judgement: Decreased awareness of safety Awareness: Emergent   General Comments: "I am so sorry, what is your name again"        Exercises General Exercises - Upper  Extremity Shoulder Flexion: Strengthening;Right;Left;10 reps;Seated;Theraband Theraband Level (Shoulder Flexion): Level 2 (Red) Shoulder Extension: Strengthening;Right;Left;10 reps;Supine;Theraband Theraband Level (Shoulder Extension): Level 2 (Red) Shoulder Horizontal ABduction: Strengthening;Both;10 reps;Theraband Theraband Level (Shoulder Horizontal Abduction): Level 2 (Red) Elbow Flexion: Strengthening;Both;10 reps;Seated;Theraband Theraband Level (Elbow Flexion): Level 2 (Red) Digit Composite Flexion: AROM;Both Composite Extension: AROM;Both   Shoulder Instructions       General Comments      Pertinent Vitals/ Pain       Pain Assessment: 0-10 Pain Score: 8  Pain Location: Buttocks, site of I&D Pain Descriptors / Indicators: Sore;Aching;Discomfort;Grimacing;Guarding Pain Intervention(s): Limited activity within patient's tolerance  Home Living                                          Prior Functioning/Environment              Frequency  Min 3X/week  Progress Toward Goals  OT Goals(current goals can now be found in the care plan section)  Progress towards OT goals: Progressing toward goals  Acute Rehab OT Goals Patient Stated Goal: get stronger and go home OT Goal Formulation: With patient Time For Goal Achievement: 05/22/18 Potential to Achieve Goals: Good  Plan Discharge plan remains appropriate;Frequency remains appropriate    Co-evaluation                 AM-PAC OT "6 Clicks" Daily Activity     Outcome Measure   Help from another person eating meals?: None Help from another person taking care of personal grooming?: A Little Help from another person toileting, which includes using toliet, bedpan, or urinal?: A Little Help from another person bathing (including washing, rinsing, drying)?: A Little Help from another person to put on and taking off regular upper body clothing?: None Help from another person to put on  and taking off regular lower body clothing?: A Little 6 Click Score: 20    End of Session Equipment Utilized During Treatment: Oxygen  OT Visit Diagnosis: Muscle weakness (generalized) (M62.81);Adult, failure to thrive (R62.7)   Activity Tolerance Patient tolerated treatment well   Patient Left in bed;with call bell/phone within reach   Nurse Communication Mobility status        Time: 5397-6734 OT Time Calculation (min): 16 min  Charges: OT General Charges $OT Visit: 1 Visit OT Treatments $Therapeutic Exercise: 8-22 mins  Hulda Humphrey OTR/L Acute Rehabilitation Services Pager: [phone] Office: [place] 05/18/2018, 5:59 PM

## 2018-05-18 NOTE — Progress Notes (Signed)
Physical Therapy Treatment Patient Details Name: Andrea [name] MRN: [id number] DOB: [birth year] Today's Date: 05/18/2018    History of Present Illness 68 yo admitted with SOB and weakness from home after leaving SNF 4/13. Pt with CHF and PNA. Pt D/C from hospital to [place] on 4/5. PMhx: CHF, CAD, breast CA, bil mastectomy, HTn, seizure, migraine, IDDM, CKD    PT Comments    Pt is agreeable to get up with PT and is mod I for bed mobility, supervision for transfers and min guard for ambulation of 3 feet to sitz bath. Pt limited in safe mobility by L buttock pain as well as decreased strength and endurance. PT will continue to follow acutely.   Follow Up Recommendations  Home health PT;Supervision/Assistance - 24 hour     Equipment Recommendations  None recommended by PT       Precautions / Restrictions Precautions Precautions: Fall Precaution Comments: watch 02 Restrictions Weight Bearing Restrictions: No    Mobility  Bed Mobility Overal bed mobility: Modified Independent Bed Mobility: Supine to Sit           General bed mobility comments: use of rail to push up into sitting  Transfers Overall transfer level: Needs assistance Equipment used: Rolling walker (2 wheeled) Transfers: Sit to/from Stand Sit to Stand: Supervision         General transfer comment: min guard for power up into standing in RW, vc for hand placement for power up  Ambulation/Gait Ambulation/Gait assistance: Min guard Gait Distance (Feet): 3 Feet Assistive device: Rolling walker (2 wheeled) Gait Pattern/deviations: Decreased stride length;Step-to pattern Gait velocity: decr  Gait velocity interpretation: <1.31 ft/sec, indicative of household ambulator General Gait Details: pt with step to gait to ambulate 3 feet from bed to sitz bath for wound care of L buttock abcess        Balance Overall balance assessment: Mild deficits observed, not formally tested;History of Falls   Sitting  balance-Leahy Scale: Good       Standing balance-Leahy Scale: Fair Standing balance comment: requires UE support for maintaining balance                            Cognition Arousal/Alertness: Awake/alert Behavior During Therapy: WFL for tasks assessed/performed Overall Cognitive Status: No family/caregiver present to determine baseline cognitive functioning Area of Impairment: Safety/judgement;Awareness;Memory                   Current Attention Level: Selective Memory: Decreased short-term memory Following Commands: Follows one step commands consistently Safety/Judgement: Decreased awareness of safety   Problem Solving: Slow processing        Exercises General Exercises - Lower Extremity Long Arc Quad: AROM;Both;Seated;20 reps Hip ABduction/ADduction: AAROM;15 reps;Seated;Both Hip Flexion/Marching: AROM;15 reps;Seated;Both    General Comments General comments (skin integrity, edema, etc.): VSS      Pertinent Vitals/Pain Pain Assessment: 0-10 Pain Score: 8  Pain Location: Buttocks, site of I&D Pain Descriptors / Indicators: Sore;Aching;Discomfort;Grimacing;Guarding Pain Intervention(s): Limited activity within patient's tolerance;Monitored during session;Repositioned           PT Goals (current goals can now be found in the care plan section) Acute Rehab PT Goals PT Goal Formulation: With patient Time For Goal Achievement: 05/22/18 Potential to Achieve Goals: Fair Progress towards PT goals: Progressing toward goals    Frequency           PT Plan Current plan remains appropriate       AM-PAC PT "  6 Clicks" Mobility   Outcome Measure  Help needed turning from your back to your side while in a flat bed without using bedrails?: None Help needed moving from lying on your back to sitting on the side of a flat bed without using bedrails?: A Little Help needed moving to and from a bed to a chair (including a wheelchair)?: A Little Help  needed standing up from a chair using your arms (e.g., wheelchair or bedside chair)?: A Little Help needed to walk in hospital room?: A Little Help needed climbing 3-5 steps with a railing? : A Little 6 Click Score: 19    End of Session Equipment Utilized During Treatment: Oxygen Activity Tolerance: Patient tolerated treatment well Patient left: in chair;with call bell/phone within reach Nurse Communication: Mobility status PT Visit Diagnosis: Muscle weakness (generalized) (M62.81);Unsteadiness on feet (R26.81);Other abnormalities of gait and mobility (R26.89)     Time: 3276-1470 PT Time Calculation (min) (ACUTE ONLY): 25 min  Charges:  $Therapeutic Activity: 8-22 mins                     Selwyn Reason B. Migdalia Dk PT, DPT Acute Rehabilitation Services Pager [phone] Office [phone]    [place] 05/18/2018, 1:12 PM

## 2018-05-18 NOTE — Progress Notes (Signed)
Patient ID: Senita Corredor, female   DOB: 07-28-1950, 68 y.o.   MRN: [id number]    5 Days Post-Op  Subjective: Patient states her buttock feels about the same.  Not much improvement.  She is doing her sitz bathes per the RN.  Objective: Vital signs in last 24 hours: Temp:  [94.9 F (34.9 C)-97.5 F (36.4 C)] 97.5 F (36.4 C) (04/27 0643) Pulse Rate:  [63-76] 74 (04/27 0643) Resp:  [16-20] 18 (04/27 0643) BP: (137-166)/(58-75) 137/74 (04/27 0643) SpO2:  [90 %-100 %] 98 % (04/27 0643) Weight:  [83.6 kg] 83.6 kg (04/27 0500) Last BM Date: 05/17/18  Intake/Output from previous day: 04/26 0701 - 04/27 0700 In: 1540 [P.O.:1440; IV Piggyback:100] Out: 2000 [Urine:2000] Intake/Output this shift: No intake/output data recorded.  PE: Buttock: wounds on left buttock are overall clean with no purulent drainage.  Penrose was out upon arrival and this was replaced although wound is healing well enough it is not going to stay easily.  Some erythema of left labial but soft, nonfluctuant.  Lab Results:  Recent Labs    05/17/18 1538 05/18/18 0547  WBC 5.5 4.6  HGB 8.0* 6.9*  HCT 25.4* 22.1*  PLT 109* 91*   BMET Recent Labs    05/17/18 0351 05/18/18 0547  NA 144 144  K 5.3* 5.1  CL 101 103  CO2 33* 36*  GLUCOSE 150* 106*  BUN 96* 104*  CREATININE 1.86* 1.85*  CALCIUM 8.3* 8.3*   PT/INR No results for input(s): LABPROT, INR in the last 72 hours. CMP     Component Value Date/Time   NA 144 05/18/2018 0547   NA 137 01/28/2018 1022   K 5.1 05/18/2018 0547   CL 103 05/18/2018 0547   CO2 36 (H) 05/18/2018 0547   GLUCOSE 106 (H) 05/18/2018 0547   BUN 104 (H) 05/18/2018 0547   BUN 30 (H) 01/28/2018 1022   CREATININE 1.85 (H) 05/18/2018 0547   CALCIUM 8.3 (L) 05/18/2018 0547   PROT 4.3 (L) 05/16/2018 0325   ALBUMIN 1.8 (L) 05/16/2018 0325   AST 37 05/16/2018 0325   ALT 14 05/16/2018 0325   ALKPHOS 41 05/16/2018 0325   BILITOT 0.5 05/16/2018 0325   GFRNONAA 28 (L)  05/18/2018 0547   GFRAA 32 (L) 05/18/2018 0547   Lipase     Component Value Date/Time   LIPASE 32 05/07/2018 1432       Studies/Results: No results found.  Anti-infectives: Anti-infectives (From admission, onward)   Start     Dose/Rate Route Frequency Ordered Stop   05/15/18 0400  ceFAZolin (ANCEF) IVPB 2g/100 mL premix     2 g 200 mL/hr over 30 Minutes Intravenous To ShortStay Surgical 05/15/18 0343 05/16/18 0400   05/08/18 2100  amoxicillin-clavulanate (AUGMENTIN) 500-125 MG per tablet 500 mg  Status:  Discontinued     1 tablet Oral 2 times daily 05/08/18 1524 05/10/18 1546   05/07/18 2200  doxycycline (VIBRA-TABS) tablet 100 mg     100 mg Oral Every 12 hours 05/07/18 2009     05/07/18 2015  cefTRIAXone (ROCEPHIN) 1 g in sodium chloride 0.9 % 100 mL IVPB  Status:  Discontinued     1 g 200 mL/hr over 30 Minutes Intravenous Every 24 hours 05/07/18 2009 05/08/18 1524       Assessment/Plan Subcutaneous abscess (4 x 3 x 3 cm and 2 x 2 x 2 cm) left buttock S/PIncision, drainage, and sharp debridement of 2 subcutaneous abscesses of the left buttock, 05/13/18, Dr.  Donnie Mesa POD #5 -continue dressing care, start sitz baths today -follow erythema tracking up left labia. -cont doxy for now secondary to erythema of labia as buttock wounds look relatively clean. -penrose may be removed soon as wound healing and penrose doesn't stay very well.  ABLA/thrombocytopenia- Hgb 6.9.  Transfuse per primary.  No bleeding from wounds.  plts 91K AKI- Cr stable DM - good glycemic control   ID -rocephin 4/16>>4/17, augmentin 4/17>>19, doxycycline 4/16>>day10 VTE -SCDs, lovenox- on Plavix also FEN -carb-modified diet  Foley -none Follow up -TBD   LOS: 11 days    Henreitta Cea , [facility] 05/18/2018, 7:39 AM Pager: [phone]

## 2018-05-18 NOTE — Progress Notes (Signed)
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 Stable SCr . Good response to qAM lasix 80mg  IV . K  5.1 . 3 to 4+ LEE, lying flat, no sig inc WOB . Hb 6.9, for transfusion this AM  04/26 0701 - 04/27 0700 In: 7824 [P.O.:1440; IV Piggyback:100] Out: 2000 [Urine:2000]  Filed Weights   05/16/18 0625 05/17/18 0406 05/18/18 0500  Weight: 84.2 kg 83.7 kg 83.6 kg    Scheduled Meds: . sodium chloride   Intravenous Once  . arformoterol  15 mcg Nebulization BID  . atorvastatin  80 mg Oral QHS  . bisacodyl  10 mg Rectal Once  . budesonide  0.5 mg Nebulization BID  . busPIRone  15 mg Oral BID  . carvedilol  25 mg Oral BID WC  . Chlorhexidine Gluconate Cloth  6 each Topical Q0600  . Chlorhexidine Gluconate Cloth  6 each Topical Once  . clopidogrel  75 mg Oral Daily  . divalproex  1,000 mg Oral Daily  . doxycycline  100 mg Oral Q12H  . folic acid  1 mg Oral Daily  . furosemide  80 mg Intravenous Once  . furosemide  80 mg Intravenous Q12H  . hydrALAZINE  50 mg Oral Q8H  . insulin aspart  0-9 Units Subcutaneous TID WC  . insulin glargine  8 Units Subcutaneous QHS  . isosorbide mononitrate  30 mg Oral Daily  . levothyroxine  200 mcg Oral QAC breakfast  . mouth rinse  15 mL Mouth Rinse BID  . Melatonin  3 mg Oral QHS  . morphine  15 mg Oral BID  . multivitamin with minerals  1 tablet Oral Q1200  . nutrition supplement (JUVEN)  1 packet Oral BID BM  . pantoprazole  40 mg Oral Daily  . polyethylene glycol  17 g Oral Daily  . pregabalin  75 mg Oral BID  . senna-docusate  2 tablet Oral BID  . sodium chloride flush  3 mL Intravenous Q12H   Continuous Infusions: . sodium chloride Stopped (05/08/18 0231)   PRN Meds:.sodium chloride, 0.9 % irrigation (POUR BTL), acetaminophen  **OR** acetaminophen, albuterol, barrier cream, clonazePAM, oxyCODONE, promethazine, silver nitrate applicators  Current Labs: reviewed  Results for Andrea [name], Andrea [name] (MRN [id number]) as of 05/18/2018 10:23  Ref. Range 05/10/2018 05:05  Saturation Ratios Latest Ref Range: 10.4 - 31.8 % 34 (H)    Physical Exam:  Blood pressure 137/74, pulse 74, temperature (!) 97.5 F (36.4 C), temperature source Oral, resp. rate 18, height 5\' 4"  (1.626 m), weight 83.6 kg, SpO2 94 %. Chronically ill appearing NAD RRR nl s1s2 CTAB 4+ LEE No rashes/lesions Nonfocal  A 1. CKD4 with hypervolemia, nephrotic proteinuria and historical serological workup,  2. Aoc dCHF, hypervolemia; diuretic responsive 3. Hyperkalemia, prev on Lokelma 4. Anemia, multifactorial presumed related to #5, #1 5. S/p I&D of buttock abscess x2; doxy; CCS following 6. HTN 7. Azotemeia related to #1 and #2  P . Schedule Lasix 80 IV BID . Hold Lokelma as it gives sig Na load; would use Veltassa if needed but diuretics should improve kalliuresis . Aranesp 105mcg today . Daily weights, Daily Renal Panel, Strict I/Os, Avoid nephrotoxins (NSAIDs, judicious IV Contrast)  . Medication Issues; o Preferred narcotic agents for pain control are hydromorphone, fentanyl, and methadone. Morphine should not be used.  o  Baclofen should be avoided o Avoid oral sodium phosphate and magnesium citrate based laxatives / bowel preps    [name] MD 05/18/2018, 10:22 AM  Recent Labs  Lab 05/16/18 0325 05/17/18 0351 05/18/18 0547  NA 144 144 144  K 5.5* 5.3* 5.1  CL 104 101 103  CO2 33* 33* 36*  GLUCOSE 133* 150* 106*  BUN 95* 96* 104*  CREATININE 2.14* 1.86* 1.85*  CALCIUM 8.0* 8.3* 8.3*   Recent Labs  Lab 05/13/18 0427  05/17/18 0351 05/17/18 1538 05/18/18 0547  WBC 5.6   < > 3.8* 5.5 4.6  NEUTROABS 3.2  --   --   --   --   HGB 7.8*   < > 7.1* 8.0* 6.9*  HCT 24.7*   < > 22.8* 25.4* 22.1*  MCV 95.7   < > 92.3 92.4 93.6   PLT 105*   < > 98* 109* 91*   < > = values in this interval not displayed.

## 2018-05-18 NOTE — Progress Notes (Signed)
PROGRESS NOTE    Andrea [name]  HUD:[id number] DOB: Jan 29, 1950 DOA: 05/07/2018 PCP: Medicine, [facility]   Brief Narrative:  68 year old female past medical history of diastolic CHF, three-vessel CAD status post PCI, diabetes mellitus and stage III-4 CKD who presented to the emergency room on 4/16 with complaints of shortness of breath as well as weakness, unable to pick herself up off the floor. She had been just discharged 10 days PTA for a multi lobar pneumonia from RSV plus CHF exacerbation. She had just left her skilled nursing facility after discharge 3 days before coming in to the back to the emergency room. COVID ruled out. Patient found to be in significant volume overload with 30 pound weight gain. Also complained of right buttock pain where she is found to have an area of induration with fluctuance.  Patient was admitted, being diuresed, creatinine fluctuating, still appears fluid overloaded.  On diuretics.  Underwent debridement of left buttock abscess 4/22. Ongoing aki and fluid overload- nephro consult 4/25   Subjective: No new complaints Again asking what is going on- explained today and seems to undertand. Willing to go to snf after discussion once stable Hb 6.9, UOP 200O ML, on albumin No chest pain or shortness of breath Assessment & Plan:   Acute on chronic diastolic congestive heart failure, echo 02/2018 w 55-60% lvef and previously 30-35% in 12.2019.  Adding Lasix 80 mg IV twice daily,continue diuresis and monitor  UOP.still appears swollen fluid overloaded. appreciate nephrology input. .Wt 177 lb 4/17-->185.7 lb->184  Bilateral lung infiltrates, negative for COVID-19.  RSV was positive on 4/6 and 3/21, concern for ongoing RSV viral shedding.  Is afebrile, WBC count stable.On droplet precaution.  Continue pulmonary toileting Brovana, Pulmicort inhalers.  Hyperkalemia: Potassium still up has been for few months. On Lokelma 10 gm daily stopping.  Recent Labs  Lab 05/14/18 0450 05/15/18 0604 05/16/18 0325 05/17/18 0351 05/18/18 0547  K 5.3* 5.9* 5.5* 5.3* 5.1   CKD  stage IV with AKI, remains fluid overloaded needing diuresis. appreciate nephrology input.  Monitor renal function closely while on Lasix.  Recent Labs  Lab 05/14/18 0450 05/15/18 0604 05/16/18 0325 05/17/18 0351 05/18/18 0547  CREATININE 1.86* 1.87* 2.14* 1.86* 1.85*   Cellulitis and abscess of buttock: Status post incision and drainage and sharp debridement of 2 abscesses of the left buttock 4/22, continue dressing, sitz bath.Currently no active bleeding.  Drain in place.  Surgery following.  Continue on doxycycline.  She is afebrile- but low temp 4/25- stable today, wbc stable  Anemia, multifactorial secondary to anemia of chronic disease, blood loss from episode of bleeding from the wound on the buttock. s/p 1 unit prbc 4/25- hb is up. Monitor as below. Transfuse if <7 gm  Off lovenox, unable to stop plavix due to stent 12/2017.  Giving her 1 unit prbc today, and added Aranesp  Recent Labs  Lab 05/16/18 0325 05/16/18 1452 05/17/18 0351 05/17/18 1538 05/18/18 0547  HGB 5.9* 7.9* 7.1* 8.0* 6.9*  HCT 18.6* 24.4* 22.8* 25.4* 22.1*   CAD S/P PCI of proximal and distal LAD w DES X2 on  01/09/2018/Essential hypertension : No chest pain.  BP is stable.  Continue her Lipitor, Plavix, Coreg, Imdur, hydralazine.  FTT/moderate protein calorie malnutrition Albumin at 1.8.  Augment nutrition.  Insulin dependent diabetes mellitus hba1c is 6.4, 2/23. Stable.  Continue CBG checks and sliding scale insulin and Lantus. Recent Labs  Lab 05/17/18 0732 05/17/18 1201 05/17/18 1621 05/17/18 2157 05/18/18 8588  GLUCAP 112* 168* 103* 128* 127*   Hypothyroidism: Continue home Synthroid.  Seizure disorder/GAD: Stable continue Depakote, BuSpar.  Chronic pain syndrome, continue current regimen.  Left labial redness :appears to be draining, continue heart compression.   Debility physical deconditioning:Continue PT/OT. Patient declining to return to skilled nursing facility, may be open to new SNF.  She lives alone and is high risk for decompensation and readmission   DVT prophylaxis: SCD Lovenox stopped due to bleeding Code Status: FULL.  Family Communication: discussed plan of care with the patient in detail again today and explained her her current active issues and she seems to understand. She seems to be overwhelmed.  I called son [name] to update and discuss 4/25, but no answer, per patient he does not pick up phone. I called again 4/27 but no answer- left message Cont pt/ot, she is now agreeable for SNF (different from last one) .  Disposition Plan: remains inpatient pending clinical improvement.    Consultants:  Surgery Nephrology  Procedures:  CT chest 05/07/2018  CXR 05/07/2018  Lower extremity Dopplers 05/09/2018  [place] Left buttocks 05/10/2018  Antimicrobials:  Augmentin 05/08/2018>>>> 05/10/2018  Oral doxycycline 05/07/2018>>>>  IV Rocephin 05/07/2018>>>> 05/08/2018  Anti-infectives (From admission, onward)   Start     Dose/Rate Route Frequency Ordered Stop   05/15/18 0400  ceFAZolin (ANCEF) IVPB 2g/100 mL premix     2 g 200 mL/hr over 30 Minutes Intravenous To [facility]Stay [facility] 05/15/18 0343 05/16/18 0400   05/08/18 2100  amoxicillin-clavulanate (AUGMENTIN) 500-125 MG per tablet 500 mg  Status:  Discontinued     1 tablet Oral 2 times daily 05/08/18 1524 05/10/18 1546   05/07/18 2200  doxycycline (VIBRA-TABS) tablet 100 mg     100 mg Oral Every 12 hours 05/07/18 2009     05/07/18 2015  cefTRIAXone (ROCEPHIN) 1 g in sodium chloride 0.9 % 100 mL IVPB  Status:  Discontinued     1 g 200 mL/hr over 30 Minutes Intravenous Every 24 hours 05/07/18 2009 05/08/18 1524       Objective: Vitals:   05/18/18 0139 05/18/18 0500 05/18/18 0643 05/18/18 0847  BP: (!) 155/58  137/74   Pulse: 73  74   Resp:   18   Temp:   (!) 97.5 F (36.4  C)   TempSrc:   Oral   SpO2:   98% 94%  Weight:  83.6 kg    Height:        Intake/Output Summary (Last 24 hours) at 05/18/2018 0935 Last data filed at 05/18/2018 0839 Gross per 24 hour  Intake 1300 ml  Output 2100 ml  Net -800 ml   Filed Weights   05/16/18 0625 05/17/18 0406 05/18/18 0500  Weight: 84.2 kg 83.7 kg 83.6 kg   Weight change: -0.136 kg  Body mass index is 31.64 kg/m.  Intake/Output from previous day: 04/26 0701 - 04/27 0700 In: 1540 [P.O.:1440; IV Piggyback:100] Out: 2000 [Urine:2000] Intake/Output this shift: Total I/O In: -  Out: 100 [Urine:100]  Examination: General exam: chronically ill looking, alert,awake NAD HEENT:Oral mucosa moist, Ear/Nose WNL grossly, dentition normal. Respiratory system: Bilateral CLEAR AIRWAY no crackles and wheezing, no use of accessory muscle. Cardiovascular system: regular rate and rhythm, S1 & S2 heard, No JVD/murmurs. Gastrointestinal system: Abdomen soft,  NT,ND BS+ Nervous System:Alert, awake and oriented. Non focal on exam, intact sensation Extremities: b/l pitting leg edema ++, distal peripheral pulses palpable.  Skin: No rashes,no icterus. MSK: Normal muscle bulk,tone, power Left buttock w dressing  and drain- soaked but no bleeding  Medications:  Scheduled Meds: . sodium chloride   Intravenous Once  . arformoterol  15 mcg Nebulization BID  . atorvastatin  80 mg Oral QHS  . bisacodyl  10 mg Rectal Once  . budesonide  0.5 mg Nebulization BID  . busPIRone  15 mg Oral BID  . carvedilol  25 mg Oral BID WC  . Chlorhexidine Gluconate Cloth  6 each Topical Q0600  . Chlorhexidine Gluconate Cloth  6 each Topical Once  . clopidogrel  75 mg Oral Daily  . divalproex  1,000 mg Oral Daily  . doxycycline  100 mg Oral Q12H  . folic acid  1 mg Oral Daily  . furosemide  80 mg Intravenous Once  . hydrALAZINE  50 mg Oral Q8H  . insulin aspart  0-9 Units Subcutaneous TID WC  . insulin glargine  8 Units Subcutaneous QHS  .  isosorbide mononitrate  30 mg Oral Daily  . levothyroxine  200 mcg Oral QAC breakfast  . mouth rinse  15 mL Mouth Rinse BID  . Melatonin  3 mg Oral QHS  . morphine  15 mg Oral BID  . multivitamin with minerals  1 tablet Oral Q1200  . nutrition supplement (JUVEN)  1 packet Oral BID BM  . pantoprazole  40 mg Oral Daily  . polyethylene glycol  17 g Oral Daily  . pregabalin  75 mg Oral BID  . senna-docusate  2 tablet Oral BID  . sodium chloride flush  3 mL Intravenous Q12H  . sodium zirconium cyclosilicate  10 g Oral Daily   Continuous Infusions: . sodium chloride Stopped (05/08/18 0231)  . albumin human      Data Reviewed: I have personally reviewed following labs and imaging studies  CBC: Recent Labs  Lab 05/13/18 0427  05/16/18 0325 05/16/18 1452 05/17/18 0351 05/17/18 1538 05/18/18 0547  WBC 5.6   < > 4.7 5.1 3.8* 5.5 4.6  NEUTROABS 3.2  --   --   --   --   --   --   HGB 7.8*   < > 5.9* 7.9* 7.1* 8.0* 6.9*  HCT 24.7*   < > 18.6* 24.4* 22.8* 25.4* 22.1*  MCV 95.7   < > 95.4 91.7 92.3 92.4 93.6  PLT 105*   < > 98* 106* 98* 109* 91*   < > = values in this interval not displayed.   Basic Metabolic Panel: Recent Labs  Lab 05/14/18 0450 05/15/18 0604 05/16/18 0325 05/17/18 0351 05/18/18 0547  NA 145 142 144 144 144  K 5.3* 5.9* 5.5* 5.3* 5.1  CL 102 100 104 101 103  CO2 34* 33* 33* 33* 36*  GLUCOSE 187* 149* 133* 150* 106*  BUN 83* 91* 95* 96* 104*  CREATININE 1.86* 1.87* 2.14* 1.86* 1.85*  CALCIUM 8.4* 8.1* 8.0* 8.3* 8.3*   GFR: Estimated Creatinine Clearance: 30.9 mL/min (A) (by C-G formula based on SCr of 1.85 mg/dL (H)). Liver Function Tests: Recent Labs  Lab 05/16/18 0325  AST 37  ALT 14  ALKPHOS 41  BILITOT 0.5  PROT 4.3*  ALBUMIN 1.8*   No results for input(s): LIPASE, AMYLASE in the last 168 hours. No results for input(s): AMMONIA in the last 168 hours. Coagulation Profile: No results for input(s): INR, PROTIME in the last 168 hours. Cardiac  Enzymes: No results for input(s): CKTOTAL, CKMB, CKMBINDEX, TROPONINI in the last 168 hours. BNP (last 3 results) No results for input(s): PROBNP in the last  8760 hours. HbA1C: No results for input(s): HGBA1C in the last 72 hours. CBG: Recent Labs  Lab 05/17/18 0732 05/17/18 1201 05/17/18 1621 05/17/18 2157 05/18/18 0643  GLUCAP 112* 168* 103* 128* 127*   Lipid Profile: No results for input(s): CHOL, HDL, LDLCALC, TRIG, CHOLHDL, LDLDIRECT in the last 72 hours. Thyroid Function Tests: No results for input(s): TSH, T4TOTAL, FREET4, T3FREE, THYROIDAB in the last 72 hours. Anemia Panel: No results for input(s): VITAMINB12, FOLATE, FERRITIN, TIBC, IRON, RETICCTPCT in the last 72 hours. Sepsis Labs: No results for input(s): PROCALCITON, LATICACIDVEN in the last 168 hours.  Recent Results (from the past 240 hour(s))  Expectorated sputum assessment w rflx to resp cult     Status: None   Collection Time: 05/09/18  5:34 AM  Result Value Ref Range Status   Specimen Description SPUTUM  Final   Special Requests NONE  Final   Sputum evaluation   Final    Sputum specimen not acceptable for testing.  Please recollect.   RESULT CALLED TO, READ BACK BY AND VERIFIED WITH: RN Madilyn Fireman [phone] FCP Performed at [facility] [address] [place], [place] [address]    Report Status 05/09/2018 FINAL  Final  Surgical pcr screen     Status: Abnormal   Collection Time: 05/13/18  4:53 AM  Result Value Ref Range Status   MRSA, PCR POSITIVE (A) NEGATIVE Final   Staphylococcus aureus POSITIVE (A) NEGATIVE Final    Comment: CRITICAL RESULT CALLED TO, READ BACK BY AND VERIFIED WITH: H.HAMLIN AT 0711 ON [id number] BY SJW (NOTE) The Xpert SA Assay (FDA approved for NASAL specimens in patients 29 years of age and older), is one component of a comprehensive surveillance program. It is not intended to diagnose infection nor to guide or monitor treatment. Performed at [facility],  [place] [address], [place], [place] [address]   Aerobic/Anaerobic Culture (surgical/deep wound)     Status: None (Preliminary result)   Collection Time: 05/13/18 10:27 AM  Result Value Ref Range Status   Specimen Description ABSCESS GLUTEAL  Final   Special Requests PATIENT ON FOLLOWING ANCEF  Final   Gram Stain   Final    RARE WBC PRESENT, PREDOMINANTLY PMN NO ORGANISMS SEEN Performed at [facility], [place] [address] [place], [place] [address]    Culture   Final    FEW METHICILLIN RESISTANT STAPHYLOCOCCUS AUREUS NO ANAEROBES ISOLATED; CULTURE IN PROGRESS FOR 5 DAYS    Report Status PENDING  Incomplete   Organism ID, Bacteria METHICILLIN RESISTANT STAPHYLOCOCCUS AUREUS  Final      Susceptibility   Methicillin resistant staphylococcus aureus - MIC*    CIPROFLOXACIN >=8 RESISTANT Resistant     ERYTHROMYCIN >=8 RESISTANT Resistant     GENTAMICIN <=0.5 SENSITIVE Sensitive     OXACILLIN >=4 RESISTANT Resistant     TETRACYCLINE <=1 SENSITIVE Sensitive     VANCOMYCIN 1 SENSITIVE Sensitive     TRIMETH/SULFA <=10 SENSITIVE Sensitive     CLINDAMYCIN <=0.25 SENSITIVE Sensitive     RIFAMPIN <=0.5 SENSITIVE Sensitive     Inducible Clindamycin NEGATIVE Sensitive     * FEW METHICILLIN RESISTANT STAPHYLOCOCCUS AUREUS      Radiology Studies: No results found.    LOS: 11 days   Time spent: More than 50% of that time was spent in counseling and/or coordination of care.  Antonieta Pert, MD [facility]  05/18/2018, 9:35 AM

## 2018-05-19 LAB — GLUCOSE, CAPILLARY
Glucose-Capillary: 45 mg/dL — ABNORMAL LOW (ref 70–99)
Glucose-Capillary: 67 mg/dL — ABNORMAL LOW (ref 70–99)
Glucose-Capillary: 98 mg/dL (ref 70–99)
Glucose-Capillary: 84 mg/dL (ref 70–99)
Glucose-Capillary: 91 mg/dL (ref 70–99)
Glucose-Capillary: 106 mg/dL — ABNORMAL HIGH (ref 70–99)
Glucose-Capillary: 127 mg/dL — ABNORMAL HIGH (ref 70–99)

## 2018-05-19 LAB — BLOOD GAS, ARTERIAL
Acid-Base Excess: 10.2 mmol/L — ABNORMAL HIGH (ref 0.0–2.0)
Bicarbonate: 35.7 mmol/L — ABNORMAL HIGH (ref 20.0–28.0)
Drawn by: 519031
O2 Content: 2 L/min
O2 Saturation: 97.6 %
Patient temperature: 98.6
pCO2 arterial: 64.2 mmHg — ABNORMAL HIGH (ref 32.0–48.0)
pH, Arterial: 7.365 (ref 7.350–7.450)
pO2, Arterial: 100 mmHg (ref 83.0–108.0)

## 2018-05-19 LAB — BPAM RBC
Blood Product Expiration Date: 202005022359
Blood Product Expiration Date: 202005062359
Blood Product Expiration Date: 202005062359
ISSUE DATE / TIME: 202004242223
ISSUE DATE / TIME: 202004250641
ISSUE DATE / TIME: 202004271229
Unit Type and Rh: 7300
Unit Type and Rh: 7300
Unit Type and Rh: 7300

## 2018-05-19 LAB — BASIC METABOLIC PANEL
Anion gap: 8 (ref 5–15)
BUN: 103 mg/dL — ABNORMAL HIGH (ref 8–23)
CO2: 34 mmol/L — ABNORMAL HIGH (ref 22–32)
Calcium: 8.7 mg/dL — ABNORMAL LOW (ref 8.9–10.3)
Chloride: 103 mmol/L (ref 98–111)
Creatinine, Ser: 1.75 mg/dL — ABNORMAL HIGH (ref 0.44–1.00)
GFR calc Af Amer: 34 mL/min — ABNORMAL LOW (ref >60)
GFR calc non Af Amer: 30 mL/min — ABNORMAL LOW (ref >60)
Glucose, Bld: 56 mg/dL — ABNORMAL LOW (ref 70–99)
Potassium: 5.4 mmol/L — ABNORMAL HIGH (ref 3.5–5.1)
Sodium: 145 mmol/L (ref 135–145)

## 2018-05-19 LAB — CBC
HCT: 26.2 % — ABNORMAL LOW (ref 36.0–46.0)
Hemoglobin: 8.3 g/dL — ABNORMAL LOW (ref 12.0–15.0)
MCH: 29 pg (ref 26.0–34.0)
MCHC: 31.7 g/dL (ref 30.0–36.0)
MCV: 91.6 fL (ref 80.0–100.0)
Platelets: 93 10*3/uL — ABNORMAL LOW (ref 150–400)
RBC: 2.86 MIL/uL — ABNORMAL LOW (ref 3.87–5.11)
RDW: 17.2 % — ABNORMAL HIGH (ref 11.5–15.5)
WBC: 7 10*3/uL (ref 4.0–10.5)
nRBC: 0 % (ref 0.0–0.2)

## 2018-05-19 LAB — TYPE AND SCREEN
ABO/RH(D): B POS
Antibody Screen: NEGATIVE
Unit division: 0
Unit division: 0
Unit division: 0

## 2018-05-19 MED ORDER — GLUCERNA SHAKE PO LIQD
237.0000 mL | Freq: Every day | ORAL | Status: DC
  Administered 2018-05-19: 237 mL via ORAL

## 2018-05-19 MED ORDER — GLUCAGON HCL RDNA (DIAGNOSTIC) 1 MG IJ SOLR
INTRAMUSCULAR | Status: AC
  Filled 2018-05-19: qty 1

## 2018-05-19 MED ORDER — FUROSEMIDE 10 MG/ML IJ SOLN
80.0000 mg | Freq: Three times a day (TID) | INTRAMUSCULAR | Status: DC
  Administered 2018-05-19 – 2018-05-21 (×6): 80 mg via INTRAVENOUS
  Filled 2018-05-19 (×6): qty 8

## 2018-05-19 MED ORDER — INSULIN GLARGINE 100 UNIT/ML ~~LOC~~ SOLN
4.0000 [IU] | Freq: Every day | SUBCUTANEOUS | Status: DC
  Administered 2018-05-19 – 2018-05-20 (×2): 4 [IU] via SUBCUTANEOUS
  Filled 2018-05-19 (×3): qty 0.04

## 2018-05-19 NOTE — Progress Notes (Signed)
Nutrition Follow-up  RD working remotely.  DOCUMENTATION CODES:   Not applicable  INTERVENTION:   -Continue 1 packet Juven BID, each packet provides 90 calories, 2.5 grams of protein, 8 grams of carbohydrate, and 14 grams of amino acids; supplement contains CaHMB, glutamine, and arginine, to promote wound healing -Continue MVI with minerals daily -Glucerna Shake po q HS, each supplement provides 220 kcal and 10 grams of protein  NUTRITION DIAGNOSIS:   Increased nutrient needs related to wound healing as evidenced by estimated needs.  Ongoing  GOAL:   Patient will meet greater than or equal to 90% of their needs  Progressing  MONITOR:   PO intake, Supplement acceptance, Labs, Weight trends, Skin, I & O's  REASON FOR ASSESSMENT:   LOS    ASSESSMENT:   Andrea [name] is a 68 y.o. female with medical history significant for chronic diastolic CHF (EF 27-51%), 3V CAD s/p PCI of proximal and distal LAD w/ DES x 2 (12/30/2017), insulin-dependent diabetes, hypertension, hypothyroidism, CKD Stage III/IV, seizure disorder, hyperlipidemia, generalized anxiety disorder, and chronic back pain who presents to the ED with worsening shortness of breath and cough productive of brown sputum.  4/19- two large abscesses on left buttock noted by RN 4/22- s/pProcedure performed: Incision, drainage, and sharp debridement of 2 subcutaneous abscesses of the left buttock; silver nitrate applied to abscess cavity due to excess bleeding 4/23- buttock wound continued to bleed; surgicel applied with no relief; plan for blood transfusion today 4/28- penrose drain removed   Reviewed I/O's: +298 ml x 24 hours and -1.6 L since admission  UOP: 1.5 L x 24 hours  Pt with good appetite; noted meal completion 80-90%. She is taking her Juven supplements. Noted hypoglycemic episodes overnight.   PT recommending home health services.   Labs reviewed: K: 5.4, CBGS: 45-98 (inpatient orders for glycemic  control are 0-9 units insulin aspart TID with meals and 4 units insulin glargine q HS).   Diet Order:   Diet Order            Diet Carb Modified Fluid consistency: Thin; Room service appropriate? Yes; Fluid restriction: 1800 mL Fluid  Diet effective now              EDUCATION NEEDS:   No education needs have been identified at this time  Skin:  Skin Assessment: Skin Integrity Issues: Skin Integrity Issues:: Unstageable, Incisions Unstageable: buttocks x 2 Incisions: rectum  Last BM:  05/18/18  Height:   Ht Readings from Last 1 Encounters:  05/13/18 5\' 4"  (1.626 m)    Weight:   Wt Readings from Last 1 Encounters:  05/19/18 84.4 kg    Ideal Body Weight:  54.5 kg  BMI:  Body mass index is 31.93 kg/m.  Estimated Nutritional Needs:   Kcal:  1700-1900  Protein:  95-110 grams  Fluid:  > 1.7 L    [name] A. Jimmye Norman, RD, LDN, [place] Registered Dietitian II Certified Diabetes Care and Education Specialist Pager: [phone] After hours Pager: [phone]

## 2018-05-19 NOTE — Progress Notes (Signed)
MD paged- patient not acting right- tried to wake patient up for medication this am.  She would not wake with voice or pain stimuli.   Physical therapist in room - Trying to stimulate patient to wake up with movement.   Check blood sugar and vitals- all WNL.  PT helped me sit patient up Patient very unstable, unable to hold up body.  Slowly patient started talking and holding eyes open slightly.  Slow and slurred speech.   PERRLA- WNL.      Rapid response came to the floor-   Ask her to check on patient.     MD ordered an ABG

## 2018-05-19 NOTE — Progress Notes (Signed)
Physical Therapy Treatment Patient Details Name: Andrea [name] MRN: [id number] DOB: 1951-01-22 Today's Date: 05/19/2018    History of Present Illness 68 yo admitted with SOB and weakness from home after leaving SNF 4/13. Pt with CHF and PNA. Pt D/C from hospital to [facility] on 4/5. PMhx: CHF, CAD, breast CA, bil mastectomy, HTn, seizure, migraine, IDDM, CKD    PT Comments    Pt with increased lethargy today, requiring maximal verbal and tactile cuing for response to questions and mobility. Assisted RN with bringing pt EoB for administration of medications. RN checked BP, CBG both within normal range. With modA and maximal verbal and tactile cuing pt able to stand and march in place lacking coordination for ambulation other than lateral stepping to HoB. Min A for maintaining seated balance while RN administered medications. Pt requires increased time and cuing to accomplish. Pt requires modA to return to bed. RN to notify MD of lethargy. PT will continue to follow acutely.      Follow Up Recommendations  Home health PT;Supervision/Assistance - 24 hour     Equipment Recommendations  None recommended by PT       Precautions / Restrictions Precautions Precautions: Fall Precaution Comments: watch 02 Restrictions Weight Bearing Restrictions: No    Mobility  Bed Mobility Overal bed mobility: Needs Assistance Bed Mobility: Supine to Sit     Supine to sit: Mod assist     General bed mobility comments: modA for bringing trunk to upright, difficulty with sequencing to push to upright  Transfers Overall transfer level: Needs assistance Equipment used: Rolling walker (2 wheeled) Transfers: Sit to/from Stand Sit to Stand: Mod assist         General transfer comment: modA for power up into standing  Ambulation/Gait Ambulation/Gait assistance: Min guard;Mod assist Gait Distance (Feet): 3 Feet Assistive device: Rolling walker (2 wheeled) Gait Pattern/deviations: Decreased stride  length;Step-to pattern Gait velocity: decr  Gait velocity interpretation: <1.31 ft/sec, indicative of household ambulator General Gait Details: modA for steadying with 3 lateral steps towards HoB       Balance Overall balance assessment: History of Falls;Needs assistance   Sitting balance-Leahy Scale: Fair       Standing balance-Leahy Scale: Poor Standing balance comment: requires outside support to maintain standing                             Cognition Arousal/Alertness: Lethargic Behavior During Therapy: WFL for tasks assessed/performed Overall Cognitive Status: No family/caregiver present to determine baseline cognitive functioning Area of Impairment: Safety/judgement;Awareness;Memory                   Current Attention Level: Focused Memory: Decreased short-term memory Following Commands: Follows one step commands inconsistently;Follows one step commands with increased time Safety/Judgement: Decreased awareness of safety   Problem Solving: Slow processing General Comments: pt very lethargic with difficulty following commands, when asked where she was could not answer, then when told she is in the hospital pt continued to repeat "hospital" through out end of session      Exercises General Exercises - Lower Extremity Hip Flexion/Marching: AROM;Both;Standing;5 reps(increased cuing for sequencing)    General Comments General comments (skin integrity, edema, etc.): PT tried to assist RN with administration of medications by assisting to maintain balance EoB.       Pertinent Vitals/Pain Pain Assessment: Faces Faces Pain Scale: Hurts little more Pain Location: Buttocks, site of I&D Pain Descriptors / Indicators: Sore;Aching;Discomfort;Grimacing;Guarding Pain Intervention(s):  Monitored during session;Limited activity within patient's tolerance;Repositioned           PT Goals (current goals can now be found in the care plan section) Acute Rehab PT  Goals PT Goal Formulation: With patient Time For Goal Achievement: 05/22/18 Potential to Achieve Goals: Fair Progress towards PT goals: Not progressing toward goals - comment(very lethargic with difficulty following commands )    Frequency           PT Plan Current plan remains appropriate       AM-PAC PT "6 Clicks" Mobility   Outcome Measure  Help needed turning from your back to your side while in a flat bed without using bedrails?: None Help needed moving from lying on your back to sitting on the side of a flat bed without using bedrails?: A Little Help needed moving to and from a bed to a chair (including a wheelchair)?: A Little Help needed standing up from a chair using your arms (e.g., wheelchair or bedside chair)?: A Little Help needed to walk in hospital room?: A Little Help needed climbing 3-5 steps with a railing? : A Little 6 Click Score: 19    End of Session Equipment Utilized During Treatment: Oxygen Activity Tolerance: Patient limited by lethargy Patient left: in bed;with bed alarm set;with nursing/sitter in room Nurse Communication: Mobility status PT Visit Diagnosis: Muscle weakness (generalized) (M62.81);Unsteadiness on feet (R26.81);Other abnormalities of gait and mobility (R26.89)     Time: 2947-6546 PT Time Calculation (min) (ACUTE ONLY): 29 min  Charges:  $Therapeutic Exercise: 8-22 mins $Therapeutic Activity: 8-22 mins                     Abbie Jablon B. Migdalia Dk PT, DPT Acute Rehabilitation Services Pager [phone] Office [phone]    [place] 05/19/2018, 4:03 PM

## 2018-05-19 NOTE — Progress Notes (Signed)
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 Stable SCr, K 5.4 not K resin . > 1.5L UOP yesterday on BID furosemide; net even and weight unchanged . Still 3 to 4+ LEE, lying flat, no sig inc WOB  04/27 0701 - 04/28 0700 In: 1747.7 [P.O.:900; I.V.:37.7; Blood:710; IV Piggyback:100] Out: 4709 [Urine:1450]  Filed Weights   05/17/18 0406 05/18/18 0500 05/19/18 0325  Weight: 83.7 kg 83.6 kg 84.4 kg    Scheduled Meds: . arformoterol  15 mcg Nebulization BID  . atorvastatin  80 mg Oral QHS  . bisacodyl  10 mg Rectal Once  . budesonide  0.5 mg Nebulization BID  . busPIRone  15 mg Oral BID  . carvedilol  25 mg Oral BID WC  . Chlorhexidine Gluconate Cloth  6 each Topical Q0600  . Chlorhexidine Gluconate Cloth  6 each Topical Once  . clopidogrel  75 mg Oral Daily  . divalproex  1,000 mg Oral Daily  . doxycycline  100 mg Oral Q12H  . feeding supplement (GLUCERNA SHAKE)  237 mL Oral QHS  . folic acid  1 mg Oral Daily  . furosemide  80 mg Intravenous Once  . furosemide  80 mg Intravenous Q12H  . hydrALAZINE  50 mg Oral Q8H  . insulin aspart  0-9 Units Subcutaneous TID WC  . insulin glargine  4 Units Subcutaneous QHS  . isosorbide mononitrate  30 mg Oral Daily  . levothyroxine  200 mcg Oral QAC breakfast  . mouth rinse  15 mL Mouth Rinse BID  . Melatonin  3 mg Oral QHS  . morphine  15 mg Oral BID  . multivitamin with minerals  1 tablet Oral Q1200  . nutrition supplement (JUVEN)  1 packet Oral BID BM  . pantoprazole  40 mg Oral Daily  . polyethylene glycol  17 g Oral Daily  . pregabalin  75 mg Oral BID  . senna-docusate  2 tablet Oral BID  . sodium chloride flush  3 mL Intravenous Q12H   Continuous Infusions: . sodium chloride Stopped (05/08/18 0231)   PRN Meds:.sodium  chloride, 0.9 % irrigation (POUR BTL), acetaminophen **OR** acetaminophen, albuterol, barrier cream, clonazePAM, oxyCODONE, promethazine, silver nitrate applicators  Current Labs: reviewed  Results for Andrea, [name] (MRN [id number]) as of 05/18/2018 10:23  Ref. Range 05/10/2018 05:05  Saturation Ratios Latest Ref Range: 10.4 - 31.8 % 34 (H)    Physical Exam:  Blood pressure (!) 146/61, pulse 69, temperature 97.7 F (36.5 C), temperature source Oral, resp. rate 18, height 5\' 4"  (1.626 m), weight 84.4 kg, SpO2 97 %. Chronically ill appearing NAD RRR nl s1s2 CTAB 4+ LEE No rashes/lesions Nonfocal  A 1. CKD4 with hypervolemia, nephrotic proteinuria and historical serological workup,  2. Aoc dCHF, hypervolemia; diuretic responsive 3. Hyperkalemia, prev on Lokelma 4. Anemia, multifactorial presumed related to #5, #1 5. S/p I&D of buttock abscess x2; doxy; CCS following 6. HTN 7. Azotemeia related to #1 and #2 + protein supplments   P . Inc lasix to TID . If K > 5.5 would use Veltassa 8.4gm to 16.8gm  . If still not consistently negative will need to add on metolazone . Daily weights, Daily Renal Panel, Strict I/Os, Avoid nephrotoxins (NSAIDs, judicious IV Contrast)  . Medication Issues; o Preferred narcotic agents for pain control are hydromorphone, fentanyl,  and methadone. Morphine should not be used.  o Baclofen should be avoided o Avoid oral sodium phosphate and magnesium citrate based laxatives / bowel preps    [name] MD 05/19/2018, 1:44 PM  Recent Labs  Lab 05/17/18 0351 05/18/18 0547 05/19/18 0427  NA 144 144 145  K 5.3* 5.1 5.4*  CL 101 103 103  CO2 33* 36* 34*  GLUCOSE 150* 106* 56*  BUN 96* 104* 103*  CREATININE 1.86* 1.85* 1.75*  CALCIUM 8.3* 8.3* 8.7*   Recent Labs  Lab 05/13/18 0427  05/17/18 1538 05/18/18 0547 05/19/18 0427  WBC 5.6   < > 5.5 4.6 7.0  NEUTROABS 3.2  --   --   --   --   HGB 7.8*   < > 8.0* 6.9* 8.3*  HCT 24.7*   < >  25.4* 22.1* 26.2*  MCV 95.7   < > 92.4 93.6 91.6  PLT 105*   < > 109* 91* 93*   < > = values in this interval not displayed.

## 2018-05-19 NOTE — Progress Notes (Signed)
PROGRESS NOTE    Andrea [name]  IRC:[id number] DOB: 30-Jul-1950 DOA: 05/07/2018 PCP: Medicine, [facility]   Brief Narrative:  68 year old female past medical history of diastolic CHF, three-vessel CAD status post PCI, diabetes mellitus and stage III-4 CKD who presented to the emergency room on 4/16 with complaints of shortness of breath as well as weakness, unable to pick herself up off the floor. She had been just discharged 10 days PTA for a multi lobar pneumonia from RSV plus CHF exacerbation. She had just left her skilled nursing facility after discharge 3 days before coming in to the back to the emergency room. COVID ruled out. Patient found to be in significant volume overload with 30 pound weight gain. Also complained of right buttock pain where she is found to have an area of induration with fluctuance.  Patient was admitted, being diuresed, creatinine fluctuating, still appears fluid overloaded.  On diuretics.  Underwent debridement of left buttock abscess 4/22. Ongoing aki and fluid overload- nephro consulted 4/25 4/28- left buttock drain removed  Subjective: No new complaints.  Complains of ongoing generalized weakness. Significant lower leg edema.  Left buttock drain removed this morning.  Denies nausea vomiting chest pain shortness of breath fever or chills. Hb up  Assessment & Plan:   Acute on chronic diastolic congestive heart failure, echo 02/2018 w 55-60% lvef and previously 30-35% in 12.2019.still has significant edema, .Wt 177 lb 4/17-->185.7 lb->184->186 lb trending up. UOP 1.45 litre/24 hr. On Lasix 80 mg IV twice daily. ?add metalozone. Agree with increasing lasix to TID for now, appreciate nephro inputs.   Bilateral lung infiltrates, negative for COVID-19.  RSV was positive on 4/6 and 3/21, concern for ongoing RSV viral shedding.  Is afebrile, WBC count stable.On droplet precaution.  Continue pulmonary toileting Brovana, Pulmicort inhalers.   Hyperkalemia: Potassium still up has been for few months. On Lokelma 10 gm stopped 4/17 per nephro. May ned to resume Recent Labs  Lab 05/15/18 0604 05/16/18 0325 05/17/18 0351 05/18/18 0547 05/19/18 0427  K 5.9* 5.5* 5.3* 5.1 5.4*   CKD  stage IV with AKI, remains fluid overloaded needing diuresis.  Creatinine improving.  Appreciate nephrology help.   Recent Labs  Lab 05/15/18 0604 05/16/18 0325 05/17/18 0351 05/18/18 0547 05/19/18 0427  CREATININE 1.87* 2.14* 1.86* 1.85* 1.75*   Cellulitis and subcutaneous abscess of left buttock for 4X3X3 and 2X2X 2 cm: Status post incision and drainage and sharp debridement of 2 abscesses of the left buttock. Drain removed today. Currently no active bleeding.  Cont dressing.  Continue doxycycline Day 11/14. She is afebrile WBC count normal.  Surgically stable.   Anemia, multifactorial secondary to anemia of chronic disease, blood loss from episode of bleeding from the wound on the buttock. s/p 1 unit prbc 4/25- Off lovenox, unable to stop plavix due to stent 12/2017.  S/p again 1 unit prbc 4/27 and was added on  Aranesp  Recent Labs  Lab 05/16/18 1452 05/17/18 0351 05/17/18 1538 05/18/18 0547 05/19/18 0427  HGB 7.9* 7.1* 8.0* 6.9* 8.3*  HCT 24.4* 22.8* 25.4* 22.1* 26.2*   CAD S/P PCI of proximal and distal LAD w DES X2 on  01/09/2018/Essential hypertension : No chest pain.  BP is stable.  Continue her Lipitor, Plavix, Coreg, Imdur, hydralazine.  FTT/moderate protein calorie malnutrition Albumin at 1.8.  Augment nutrition.  Insulin dependent diabetes mellitus hba1c is 6.4, 2/23. Stable.  Continue CBG checks and sliding scale insulin and Lantus. Recent Labs  Lab 05/18/18 1638  05/18/18 2148 05/19/18 0605 05/19/18 0637 05/19/18 0755  GLUCAP 95 82 45* 67* 98   Hypothyroidism: Continue home Synthroid.  Seizure disorder/GAD: Stable continue Depakote, BuSpar.  Chronic pain syndrome, continue current regimen.  Left labial redness  :appears to be draining, continue heart compression.  Debility physical deconditioning:Continue PT/OT. Patient declining to return to skilled nursing facility, may be open to new SNF.  She lives alone and is high risk for decompensation and readmission  Lethargy/ did abg showed co2 in 65s but ph compensated. Non focal. minimnize narcotics. Pt asking for more pain meds.   DVT prophylaxis: SCD Lovenox stopped due to bleeding Code Status: FULL.  Family Communication: discussed plan of care with the patient in detail again today and explained her her current active issues and she seems to understand. She seems to be overwhelmed.  I called son [name] to update and discuss 4/25, but no answer, per patient he does not pick up phone. I called again 4/27 but no answer- left message. Will reattempt Cont pt/ot, she is now agreeable for SNF (different from last one) .  Disposition Plan: remains inpatient pending clinical improvement.    Consultants:  Surgery Nephrology  Procedures:  CT chest 05/07/2018  CXR 05/07/2018  Lower extremity Dopplers 05/09/2018  [place] Left buttocks 05/10/2018  Antimicrobials:  Augmentin 05/08/2018>>>> 05/10/2018  Oral doxycycline 05/07/2018>>>>  IV Rocephin 05/07/2018>>>> 05/08/2018  Anti-infectives (From admission, onward)   Start     Dose/Rate Route Frequency Ordered Stop   05/15/18 0400  ceFAZolin (ANCEF) IVPB 2g/100 mL premix     2 g 200 mL/hr over 30 Minutes Intravenous To ShortStay Surgical 05/15/18 0343 05/16/18 0400   05/08/18 2100  amoxicillin-clavulanate (AUGMENTIN) 500-125 MG per tablet 500 mg  Status:  Discontinued     1 tablet Oral 2 times daily 05/08/18 1524 05/10/18 1546   05/07/18 2200  doxycycline (VIBRA-TABS) tablet 100 mg     100 mg Oral Every 12 hours 05/07/18 2009     05/07/18 2015  cefTRIAXone (ROCEPHIN) 1 g in sodium chloride 0.9 % 100 mL IVPB  Status:  Discontinued     1 g 200 mL/hr over 30 Minutes Intravenous Every 24 hours 05/07/18 2009  05/08/18 1524       Objective: Vitals:   05/18/18 2002 05/18/18 2026 05/19/18 0325 05/19/18 0352  BP:  (!) 147/68  135/68  Pulse:  75  78  Resp:  18  18  Temp:  (!) 97.4 F (36.3 C)  97.7 F (36.5 C)  TempSrc:  Oral  Oral  SpO2: 99% 98%  98%  Weight:   84.4 kg   Height:        Intake/Output Summary (Last 24 hours) at 05/19/2018 0944 Last data filed at 05/19/2018 0300 Gross per 24 hour  Intake 1747.67 ml  Output 1350 ml  Net 397.67 ml   Filed Weights   05/17/18 0406 05/18/18 0500 05/19/18 0325  Weight: 83.7 kg 83.6 kg 84.4 kg   Weight change: 0.771 kg  Body mass index is 31.93 kg/m.  Intake/Output from previous day: 04/27 0701 - 04/28 0700 In: 1747.7 [P.O.:900; I.V.:37.7; Blood:710; IV Piggyback:100] Out: 3299 [Urine:1450] Intake/Output this shift: No intake/output data recorded.  Examination:  General exam: Chronically sick looking, not in acute distress, comfortable   HEENT:Oral mucosa moist, Ear/Nose WNL grossly, dentition normal. Respiratory system: Bilateral diminished breath sounds at the base, no crackles or wheezing.   Cardiovascular system: regular rate and rhythm, S1 & S2 heard, No JVD/murmurs. Gastrointestinal system:  Abdomen soft, non-tender, non-distended, BS +. No hepatosplenomegaly palpable. Nervous System:Alert, awake and oriented at baseline. Able to move UE and LE, sensation intact. Extremities: Bilateral pitting edema 3+ distal peripheral pulses palpable.  Skin: No rashes,no icterus. MSK: Normal muscle bulk,tone, power Left buttock wound with dressing intact, no bleeding.  Medications:  Scheduled Meds: . arformoterol  15 mcg Nebulization BID  . atorvastatin  80 mg Oral QHS  . bisacodyl  10 mg Rectal Once  . budesonide  0.5 mg Nebulization BID  . busPIRone  15 mg Oral BID  . carvedilol  25 mg Oral BID WC  . Chlorhexidine Gluconate Cloth  6 each Topical Q0600  . Chlorhexidine Gluconate Cloth  6 each Topical Once  . clopidogrel  75 mg  Oral Daily  . divalproex  1,000 mg Oral Daily  . doxycycline  100 mg Oral Q12H  . folic acid  1 mg Oral Daily  . furosemide  80 mg Intravenous Once  . furosemide  80 mg Intravenous Q12H  . hydrALAZINE  50 mg Oral Q8H  . insulin aspart  0-9 Units Subcutaneous TID WC  . insulin glargine  4 Units Subcutaneous QHS  . isosorbide mononitrate  30 mg Oral Daily  . levothyroxine  200 mcg Oral QAC breakfast  . mouth rinse  15 mL Mouth Rinse BID  . Melatonin  3 mg Oral QHS  . morphine  15 mg Oral BID  . multivitamin with minerals  1 tablet Oral Q1200  . nutrition supplement (JUVEN)  1 packet Oral BID BM  . pantoprazole  40 mg Oral Daily  . polyethylene glycol  17 g Oral Daily  . pregabalin  75 mg Oral BID  . senna-docusate  2 tablet Oral BID  . sodium chloride flush  3 mL Intravenous Q12H   Continuous Infusions: . sodium chloride Stopped (05/08/18 0231)    Data Reviewed: I have personally reviewed following labs and imaging studies  CBC: Recent Labs  Lab 05/13/18 0427  05/16/18 1452 05/17/18 0351 05/17/18 1538 05/18/18 0547 05/19/18 0427  WBC 5.6   < > 5.1 3.8* 5.5 4.6 7.0  NEUTROABS 3.2  --   --   --   --   --   --   HGB 7.8*   < > 7.9* 7.1* 8.0* 6.9* 8.3*  HCT 24.7*   < > 24.4* 22.8* 25.4* 22.1* 26.2*  MCV 95.7   < > 91.7 92.3 92.4 93.6 91.6  PLT 105*   < > 106* 98* 109* 91* 93*   < > = values in this interval not displayed.   Basic Metabolic Panel: Recent Labs  Lab 05/15/18 0604 05/16/18 0325 05/17/18 0351 05/18/18 0547 05/19/18 0427  NA 142 144 144 144 145  K 5.9* 5.5* 5.3* 5.1 5.4*  CL 100 104 101 103 103  CO2 33* 33* 33* 36* 34*  GLUCOSE 149* 133* 150* 106* 56*  BUN 91* 95* 96* 104* 103*  CREATININE 1.87* 2.14* 1.86* 1.85* 1.75*  CALCIUM 8.1* 8.0* 8.3* 8.3* 8.7*   GFR: Estimated Creatinine Clearance: 32.8 mL/min (A) (by C-G formula based on SCr of 1.75 mg/dL (H)). Liver Function Tests: Recent Labs  Lab 05/16/18 0325  AST 37  ALT 14  ALKPHOS 41   BILITOT 0.5  PROT 4.3*  ALBUMIN 1.8*   No results for input(s): LIPASE, AMYLASE in the last 168 hours. No results for input(s): AMMONIA in the last 168 hours. Coagulation Profile: No results for input(s): INR, PROTIME in the last  168 hours. Cardiac Enzymes: No results for input(s): CKTOTAL, CKMB, CKMBINDEX, TROPONINI in the last 168 hours. BNP (last 3 results) No results for input(s): PROBNP in the last 8760 hours. HbA1C: No results for input(s): HGBA1C in the last 72 hours. CBG: Recent Labs  Lab 05/18/18 1638 05/18/18 2148 05/19/18 0605 05/19/18 0637 05/19/18 0755  GLUCAP 95 82 45* 67* 98   Lipid Profile: No results for input(s): CHOL, HDL, LDLCALC, TRIG, CHOLHDL, LDLDIRECT in the last 72 hours. Thyroid Function Tests: No results for input(s): TSH, T4TOTAL, FREET4, T3FREE, THYROIDAB in the last 72 hours. Anemia Panel: No results for input(s): VITAMINB12, FOLATE, FERRITIN, TIBC, IRON, RETICCTPCT in the last 72 hours. Sepsis Labs: No results for input(s): PROCALCITON, LATICACIDVEN in the last 168 hours.  Recent Results (from the past 240 hour(s))  Surgical pcr screen     Status: Abnormal   Collection Time: 05/13/18  4:53 AM  Result Value Ref Range Status   MRSA, PCR POSITIVE (A) NEGATIVE Final   Staphylococcus aureus POSITIVE (A) NEGATIVE Final    Comment: CRITICAL RESULT CALLED TO, READ BACK BY AND VERIFIED WITH: H.HAMLIN AT [id number] ON [id number] BY SJW (NOTE) The Xpert SA Assay (FDA approved for NASAL specimens in patients 76 years of age and older), is one component of a comprehensive surveillance program. It is not intended to diagnose infection nor to guide or monitor treatment. Performed at [facility], [place] [address], [place], [place] [address]   Aerobic/Anaerobic Culture (surgical/deep wound)     Status: None   Collection Time: 05/13/18 10:27 AM  Result Value Ref Range Status   Specimen Description ABSCESS GLUTEAL  Final   Special Requests PATIENT ON  FOLLOWING ANCEF  Final   Gram Stain   Final    RARE WBC PRESENT, PREDOMINANTLY PMN NO ORGANISMS SEEN    Culture   Final    FEW METHICILLIN RESISTANT STAPHYLOCOCCUS AUREUS NO ANAEROBES ISOLATED Performed at [facility], [place] [address] [place], [place] [address]    Report Status 05/18/2018 FINAL  Final   Organism ID, Bacteria METHICILLIN RESISTANT STAPHYLOCOCCUS AUREUS  Final      Susceptibility   Methicillin resistant staphylococcus aureus - MIC*    CIPROFLOXACIN >=8 RESISTANT Resistant     ERYTHROMYCIN >=8 RESISTANT Resistant     GENTAMICIN <=0.5 SENSITIVE Sensitive     OXACILLIN >=4 RESISTANT Resistant     TETRACYCLINE <=1 SENSITIVE Sensitive     VANCOMYCIN 1 SENSITIVE Sensitive     TRIMETH/SULFA <=10 SENSITIVE Sensitive     CLINDAMYCIN <=0.25 SENSITIVE Sensitive     RIFAMPIN <=0.5 SENSITIVE Sensitive     Inducible Clindamycin NEGATIVE Sensitive     * FEW METHICILLIN RESISTANT STAPHYLOCOCCUS AUREUS      Radiology Studies: No results found.    LOS: 12 days   Time spent: More than 50% of that time was spent in counseling and/or coordination of care.  Antonieta Pert, MD [facility]  05/19/2018, 9:44 AM

## 2018-05-19 NOTE — Progress Notes (Signed)
Patient ID: Andrea [name], female   DOB: [birth year], 68 y.o.   MRN: [id number]    6 Days Post-Op  Subjective: Pt still complains of pain at her wound sites.  Objective: Vital signs in last 24 hours: Temp:  [97.1 F (36.2 C)-97.7 F (36.5 C)] (P) 97.7 F (36.5 C) (04/28 0352) Pulse Rate:  [69-78] (P) 78 (04/28 0352) Resp:  [16-18] (P) 18 (04/28 0352) BP: (121-147)/(55-91) (P) 135/68 (04/28 0352) SpO2:  [94 %-99 %] (P) 98 % (04/28 0352) Weight:  [84.4 kg] 84.4 kg (04/28 0325) Last BM Date: 05/18/18  Intake/Output from previous day: 04/27 0701 - 04/28 0700 In: 1747.7 [P.O.:900; I.V.:37.7; Blood:710; IV Piggyback:100] Out: 9295 [Urine:1450] Intake/Output this shift: No intake/output data recorded.  PE: Skin: buttock wounds are overall clean.  No bleeding.  Still some erythema tracking towards her left labia, but no fluctuance or skin breakdown.  Penrose removed and NS WD dressing was placed in both wounds.  Lab Results:  Recent Labs    05/18/18 0547 05/19/18 0427  WBC 4.6 7.0  HGB 6.9* 8.3*  HCT 22.1* 26.2*  PLT 91* 93*   BMET Recent Labs    05/18/18 0547 05/19/18 0427  NA 144 145  K 5.1 5.4*  CL 103 103  CO2 36* 34*  GLUCOSE 106* 56*  BUN 104* 103*  CREATININE 1.85* 1.75*  CALCIUM 8.3* 8.7*   PT/INR No results for input(s): LABPROT, INR in the last 72 hours. CMP     Component Value Date/Time   NA 145 05/19/2018 0427   NA 137 01/28/2018 1022   K 5.4 (H) 05/19/2018 0427   CL 103 05/19/2018 0427   CO2 34 (H) 05/19/2018 0427   GLUCOSE 56 (L) 05/19/2018 0427   BUN 103 (H) 05/19/2018 0427   BUN 30 (H) 01/28/2018 1022   CREATININE 1.75 (H) 05/19/2018 0427   CALCIUM 8.7 (L) 05/19/2018 0427   PROT 4.3 (L) 05/16/2018 0325   ALBUMIN 1.8 (L) 05/16/2018 0325   AST 37 05/16/2018 0325   ALT 14 05/16/2018 0325   ALKPHOS 41 05/16/2018 0325   BILITOT 0.5 05/16/2018 0325   GFRNONAA 30 (L) 05/19/2018 0427   GFRAA 34 (L) 05/19/2018 0427   Lipase      Component Value Date/Time   LIPASE 32 05/07/2018 1432       Studies/Results: No results found.  Anti-infectives: Anti-infectives (From admission, onward)   Start     Dose/Rate Route Frequency Ordered Stop   05/15/18 0400  ceFAZolin (ANCEF) IVPB 2g/100 mL premix     2 g 200 mL/hr over 30 Minutes Intravenous To ShortStay Surgical 05/15/18 0343 05/16/18 0400   05/08/18 2100  amoxicillin-clavulanate (AUGMENTIN) 500-125 MG per tablet 500 mg  Status:  Discontinued     1 tablet Oral 2 times daily 05/08/18 1524 05/10/18 1546   05/07/18 2200  doxycycline (VIBRA-TABS) tablet 100 mg     100 mg Oral Every 12 hours 05/07/18 2009     05/07/18 2015  cefTRIAXone (ROCEPHIN) 1 g in sodium chloride 0.9 % 100 mL IVPB  Status:  Discontinued     1 g 200 mL/hr over 30 Minutes Intravenous Every 24 hours 05/07/18 2009 05/08/18 1524       Assessment/Plan Subcutaneous abscess (4 x 3 x 3 cm and 2 x 2 x 2 cm) left buttock S/PIncision, drainage, and sharp debridement of 2 subcutaneous abscesses of the left buttock, 05/13/18, Dr. Donnie Mesa POD #6 -penrose removed as it will not stay in the  wound.  Start NS WD dressing changes to buttock wounds BID.  Continue sitz bathes. -erythema to labia stable.  No evidence of abscess or further need for debridement. -AF normal WBC.   Buttock wounds are clean.  On doxy for 11 days.  Unclear how much longer she really needs this.  Could complete a 14 day course given persistent erythema of labia -at this point the patient is surgically stable for DC to SNF when medically stable.   ABLA/thrombocytopenia- Hgb 8.3.  No bleeding from wounds.  plts 93K AKI- Cr stable DM - good glycemic control   ID -rocephin 4/16>>4/17, augmentin 4/17>>19, doxycycline 4/16>>day11 VTE -SCDs,Plavix FEN -carb-modified diet  Foley -none Follow up -TBD POC - son, but doesn't answer phone   LOS: 12 days    Henreitta Cea , [facility] Surgery 05/19/2018, 8:23 AM  Pager: [phone]

## 2018-05-19 NOTE — Plan of Care (Signed)
  Problem: Education: Goal: Knowledge of General Education information will improve Description Including pain rating scale, medication(s)/side effects and non-pharmacologic comfort measures Outcome: Progressing   Problem: Health Behavior/Discharge Planning: Goal: Ability to manage health-related needs will improve Outcome: Progressing   Problem: Clinical Measurements: Goal: Ability to maintain clinical measurements within normal limits will improve Outcome: Progressing Goal: Will remain free from infection Outcome: Progressing Goal: Diagnostic test results will improve Outcome: Progressing Goal: Respiratory complications will improve Outcome: Progressing Goal: Cardiovascular complication will be avoided Outcome: Progressing   Problem: Nutrition: Goal: Adequate nutrition will be maintained Outcome: Progressing   Problem: Pain Managment: Goal: General experience of comfort will improve Outcome: Progressing   Problem: Safety: Goal: Ability to remain free from injury will improve Outcome: Progressing   Problem: Skin Integrity: Goal: Risk for impaired skin integrity will decrease Outcome: Progressing   Problem: Education: Goal: Ability to demonstrate management of disease process will improve Outcome: Progressing Goal: Ability to verbalize understanding of medication therapies will improve Outcome: Progressing   Problem: Activity: Goal: Capacity to carry out activities will improve Outcome: Progressing   Problem: Cardiac: Goal: Ability to achieve and maintain adequate cardiopulmonary perfusion will improve Outcome: Progressing

## 2018-05-19 NOTE — Significant Event (Signed)
Rapid Response Event Note  --Reviewed ABG results with RN via phone: 05/19/2018 12:10 Sample type: ARTERIAL DRAW Delivery systems: NASAL CANNULA O2 Content: 2.0 pH, Arterial: 7.365 pCO2 arterial: 64.2 (H) pO2, Arterial: 100 Acid-Base Excess: 10.2 (H) Bicarbonate: 35.7 (H) O2 Saturation: 97.6  -- RN states patient is more alert and responsive  Plan of Care: -- Encourage respiratory toilet with IS -- Call RRT if there is a change in condition  Event Summary: End time: [place]    Carl Best

## 2018-05-19 NOTE — Significant Event (Signed)
Rapid Response Event Note  Overview:  Asked to see patient by RN while rounding on the floor.  Nurse expressed concerns of weakness and lethargy, ongoing and worsening. Patient listing to the right and increased general weakness endorsed by PT.      Initial Focused Assessment: Patient resting in bed, leaning to her right side.  Awake and oriented x4 but lethargic, slow to respond to questions.   Modified stroke assessment done, no deficits noted.  Patient needs frequent verbal stimulation to participate. She complains of generalized weakness and feels she is not making any progress. Lunch tray arrived and patient states she is too tired to eat.  Patient is in no distress, normal WOB, skin warm and dry.   Review of medications patient received @1101  included Buspar, Lyrica, MS Contin.  These are not new and are home medications.   CBG @ 1111 = 84 VS stable  RN spoke with Dr Lupita Leash, ordered ABG.  Per RN patient is more alert and responsive than earlier this morning prior to medications given at 1101.  Interventions: -- Vital signs taken, normal. O2 sat is 100% on 2L [place]  Plan of Care (if not transferred): -- RT notified of ABG order -- Asked RN to call if condition worsens and with ABG results   Event Summary: Arrival time: 1150 End time: 1211    Maranatha Grossi, Lattie Haw

## 2018-05-19 NOTE — Progress Notes (Signed)
Pt states "I would like to have my sister on a conference call with the MD in the morning".

## 2018-05-20 DIAGNOSIS — L03317 Cellulitis of buttock: Secondary | ICD-10-CM

## 2018-05-20 DIAGNOSIS — L0231 Cutaneous abscess of buttock: Secondary | ICD-10-CM

## 2018-05-20 LAB — RENAL FUNCTION PANEL
Albumin: 2.6 g/dL — ABNORMAL LOW (ref 3.5–5.0)
Anion gap: 8 (ref 5–15)
BUN: 107 mg/dL — ABNORMAL HIGH (ref 8–23)
CO2: 36 mmol/L — ABNORMAL HIGH (ref 22–32)
Calcium: 8.7 mg/dL — ABNORMAL LOW (ref 8.9–10.3)
Chloride: 100 mmol/L (ref 98–111)
Creatinine, Ser: 1.71 mg/dL — ABNORMAL HIGH (ref 0.44–1.00)
GFR calc Af Amer: 35 mL/min — ABNORMAL LOW (ref >60)
GFR calc non Af Amer: 30 mL/min — ABNORMAL LOW (ref >60)
Glucose, Bld: 104 mg/dL — ABNORMAL HIGH (ref 70–99)
Phosphorus: 3.9 mg/dL (ref 2.5–4.6)
Potassium: 5.4 mmol/L — ABNORMAL HIGH (ref 3.5–5.1)
Sodium: 144 mmol/L (ref 135–145)

## 2018-05-20 LAB — CBC
HCT: 26.4 % — ABNORMAL LOW (ref 36.0–46.0)
Hemoglobin: 8.3 g/dL — ABNORMAL LOW (ref 12.0–15.0)
MCH: 29.1 pg (ref 26.0–34.0)
MCHC: 31.4 g/dL (ref 30.0–36.0)
MCV: 92.6 fL (ref 80.0–100.0)
Platelets: 88 10*3/uL — ABNORMAL LOW (ref 150–400)
RBC: 2.85 MIL/uL — ABNORMAL LOW (ref 3.87–5.11)
RDW: 16.4 % — ABNORMAL HIGH (ref 11.5–15.5)
WBC: 6.5 10*3/uL (ref 4.0–10.5)
nRBC: 0 % (ref 0.0–0.2)

## 2018-05-20 LAB — GLUCOSE, CAPILLARY
Glucose-Capillary: 95 mg/dL (ref 70–99)
Glucose-Capillary: 99 mg/dL (ref 70–99)
Glucose-Capillary: 97 mg/dL (ref 70–99)
Glucose-Capillary: 93 mg/dL (ref 70–99)
Glucose-Capillary: 131 mg/dL — ABNORMAL HIGH (ref 70–99)

## 2018-05-20 NOTE — TOC Progression Note (Addendum)
Transition of Care [facility]) - Progression Note    Patient Details  Name: Andrea [name] MRN: [id number] Date of Birth: 06-03-1950  Transition of [facility]) CM/SW Contact  Sherrilyn Rist Phone Number: [phone] 05/20/2018, 9:22 AM  Clinical Narrative:    [place] arranged with [facility] as pt requested. Pt is refusing SNF/ in co pay days. [facility] called - spoke to [place] - [employer] requires her to have a 3 day ICU stay to qualify for LTAC.    Expected Discharge Plan: [place] Barriers to Discharge: No Barriers Identified  Expected Discharge Plan and Services Expected Discharge Plan: [place] In-house Referral: NA Discharge Planning Services: CM Consult Post Acute Care Choice: [place] arrangements for the past 2 months: Single Family Home                 DME Arranged: N/A         HH Arranged: RN, Disease Management, OT, PT, Nurse's Aide, Social Work [employer] Agency: [facility]         Social Determinants of Health (SDOH) Interventions    Readmission Risk Interventions Readmission Risk Prevention Plan 04/12/2018  Transportation Screening Complete  Medication Review Press photographer) Complete  PCP or Specialist appointment within 3-5 days of discharge Complete  HRI or Home Care Consult Complete  SW Recovery Care/Counseling Consult Complete  [place] Complete  Some recent data might be hidden

## 2018-05-20 NOTE — Progress Notes (Signed)
Pt states to RN "I am scared to go home. I have no where to go. I went to rehab before and they would not let me take care of myself or let me out of my bedroom. Now my sons are shipping me off to live alone at home and I want to prove to them I can be independent. I want to show them that I fell and guided my self to the floor and show them I can be independent". RN gave pt emotional support. Will continue to monitor.

## 2018-05-20 NOTE — Progress Notes (Addendum)
Pt has called out many times throughout the night. If staff does not come to bedside right away pt will try and jump out of bed. Pt called staff stating "My purewick is full my purewick is full". RN went to assess situation and purewick only had 264mL in canister.  Pt then calls back 5 minutes later and states "My purewick is still full". RN was taking care of another pt when suddenly this pt tried jumping out of bed and the low bed alarm started. 2 RNs ran to pt room to find pt has grabbed her low bed remote and raised her bed as high as it goes and states "What do I have to do to get help, I need to be taken care of. Why does nobody care about me". Pt educated that RN and NT have multiple patients to take care of and cannot be there right away. Pt has also been educated on the proper uses of the low bed remote. Will continue to monitor pt.

## 2018-05-20 NOTE — Progress Notes (Signed)
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 1.2L UOP quantified + 5 unmeasured voids . Stable SCr, K 5.4.  BUN 107 . Weight up 2lb?  04/28 0701 - 04/29 0700 In: 1083 [P.O.:1080; I.V.:3] Out: 1153 [Urine:1150; Stool:3]  Filed Weights   05/18/18 0500 05/19/18 0325 05/20/18 0313  Weight: 83.6 kg 84.4 kg 86.7 kg    Scheduled Meds: . arformoterol  15 mcg Nebulization BID  . atorvastatin  80 mg Oral QHS  . bisacodyl  10 mg Rectal Once  . budesonide  0.5 mg Nebulization BID  . busPIRone  15 mg Oral BID  . carvedilol  25 mg Oral BID WC  . Chlorhexidine Gluconate Cloth  6 each Topical Q0600  . Chlorhexidine Gluconate Cloth  6 each Topical Once  . clopidogrel  75 mg Oral Daily  . divalproex  1,000 mg Oral Daily  . doxycycline  100 mg Oral Q12H  . feeding supplement (GLUCERNA SHAKE)  237 mL Oral QHS  . folic acid  1 mg Oral Daily  . furosemide  80 mg Intravenous Once  . furosemide  80 mg Intravenous Q8H  . hydrALAZINE  50 mg Oral Q8H  . insulin aspart  0-9 Units Subcutaneous TID WC  . insulin glargine  4 Units Subcutaneous QHS  . isosorbide mononitrate  30 mg Oral Daily  . levothyroxine  200 mcg Oral QAC breakfast  . mouth rinse  15 mL Mouth Rinse BID  . Melatonin  3 mg Oral QHS  . morphine  15 mg Oral BID  . multivitamin with minerals  1 tablet Oral Q1200  . nutrition supplement (JUVEN)  1 packet Oral BID BM  . pantoprazole  40 mg Oral Daily  . polyethylene glycol  17 g Oral Daily  . pregabalin  75 mg Oral BID  . senna-docusate  2 tablet Oral BID  . sodium chloride flush  3 mL Intravenous Q12H   Continuous Infusions: . sodium chloride Stopped (05/08/18 0231)   PRN Meds:.sodium chloride, 0.9 % irrigation (POUR BTL), acetaminophen **OR** acetaminophen, albuterol,  barrier cream, clonazePAM, oxyCODONE, promethazine, silver nitrate applicators  Current Labs: reviewed  Results for Andrea [name], Andrea [name] (MRN [id number]) as of 05/18/2018 10:23  Ref. Range 05/10/2018 05:05  Saturation Ratios Latest Ref Range: 10.4 - 31.8 % 34 (H)    Physical Exam:  Blood pressure (!) 152/67, pulse 69, temperature (!) 97.2 F (36.2 C), temperature source Oral, resp. rate 16, height 5\' 4"  (1.626 m), weight 86.7 kg, SpO2 98 %. Chronically ill appearing NAD RRR nl s1s2 CTAB 4+ LEE No rashes/lesions Nonfocal  A 1. CKD4 with hypervolemia, nephrotic proteinuria and historical serological workup,  2. Aoc dCHF, hypervolemia; diuretic responsive 3. Hyperkalemia, prev on Lokelma 4. Anemia, multifactorial presumed related to #5, #1 5. S/p I&D of buttock abscess x2; doxy; CCS following 6. HTN 7. Azotemeia related to #1 and #2 + protein supplments   P . Cont TID lasix . If K > 5.5 would use Veltassa 8.4gm to 16.8gm  . LEE seems stable if not a tad better, no added thiazide at this time . Daily weights, Daily Renal Panel, Strict I/Os, Avoid nephrotoxins (NSAIDs, judicious IV Contrast)  . Medication Issues; o Preferred narcotic agents for pain control are hydromorphone, fentanyl, and methadone. Morphine should not be used.  o Baclofen should be  avoided o Avoid oral sodium phosphate and magnesium citrate based laxatives / bowel preps    [name] MD 05/20/2018, 12:01 PM  Recent Labs  Lab 05/17/18 0351 05/18/18 0547 05/19/18 0427  NA 144 144 145  K 5.3* 5.1 5.4*  CL 101 103 103  CO2 33* 36* 34*  GLUCOSE 150* 106* 56*  BUN 96* 104* 103*  CREATININE 1.86* 1.85* 1.75*  CALCIUM 8.3* 8.3* 8.7*   Recent Labs  Lab 05/17/18 1538 05/18/18 0547 05/19/18 0427  WBC 5.5 4.6 7.0  HGB 8.0* 6.9* 8.3*  HCT 25.4* 22.1* 26.2*  MCV 92.4 93.6 91.6  PLT 109* 91* 93*

## 2018-05-20 NOTE — Progress Notes (Signed)
PROGRESS NOTE  Brittie Whisnant UDJ:[id number] DOB: 02/09/1950 DOA: 05/07/2018 PCP: Medicine, [facility]   PCP: Medicine, [facility]             Brief Narrative:  68 year old female past medical history of diastolic CHF, three-vessel CAD status post PCI, diabetes mellitus and stage III-4 CKD who presented to the emergency room on 4/16 with complaints of shortness of breath as well as weakness, unable to pick herself up off the floor. She had been just discharged 10 days PTA for a multi lobar pneumonia from RSV plus CHF exacerbation. She had just left her skilled nursing facility after discharge 3 days before coming in to the back to the emergency room. COVID ruled out. Patient found to be in significant volume overload with 30 pound weight gain. Also complained of right buttock pain where she is found to have an area of induration with fluctuance.  Patient was admitted, being diuresed, creatinine fluctuating, still appears fluid overloaded.  On diuretics.  Underwent debridement of left buttock abscess 4/22. Ongoing aki and fluid overload- nephro consulted 4/25 4/28- left buttock drain removed   HPI/Recap of past 24 hours:  She is sitting up in chair, no cough, denies chest pain, edema is improving Left buttock with mild pain,  She wants to go to SNF and has many questions regarding snf placement  Assessment/Plan: Principal Problem:   Acute on chronic diastolic congestive heart failure ([place]) Active Problems:   Essential hypertension   Insulin dependent diabetes mellitus ([place])   Hypothyroidism   History of seizures   Chronic pain   CAD S/P percutaneous coronary angioplasty   Seizure (HCC)   Anemia   GAD (generalized anxiety disorder)   Hyperkalemia   CKD (chronic kidney disease), stage III (HCC)   Bilateral leg edema   FTT (failure to thrive) in adult   Pressure injury of skin   Cellulitis and abscess of buttock  Acute on chronic  diastolic CHF: -Patient with 30lbs weight gain, significant volume overload on admission with bilateral lower extremity 3+ pitting edema up to thigh/flank,  -venous doppler negative for DVT -she has slightly increased right pleural effusion from prior although BNP is improved from last admission. -she is started on IV Lasix 40 mg twice daily on admission, lasix dose increased to 80mg  bid on 4/18, now on iv lasix 80mg  tid  -Continue home carvedilol/hydraalzine/imdur -Strict I/O's, daily weights -nephrology input appreciated  Bilateral lung infiltrate, viral pneumonia vs from chf -RSV + on this admission, interestingly she was tested + for RSV on 3/21 during last hospitalization as well, she might have ongoing RSV shedding for the last three weeks -COVID19 testing on 4/19 is negative -procalcitonin 0.11 -She is afebrile, no leukocytosis, no lymphopenia - congested cough, rhonchi initially, now lungs are clear, - cotinue Mucinex, nebs, supportive care, continue diuresis  CADs/p PCI of proximal and distal LAD w/ DES x 2 (12/30/2017): Chronic and stable without typical chest pain.  Patient not on aspirin due to prior allergy. She was previously on Brilinta after DES placement however this was switched to Plavix on recent admission. It was recommended she continue Plavix due to risk for stent thrombosis without antiplatelet therapy  Insulin-dependent diabetes: A1c 6.4 on 03/15/2018. -Continue Lantus plus sensitive SSI, adjust prn  AKI on CKDIII Creatinine and GFR relatively stable compared to recent baseline however slowly worsening over the last 6 months. Likely multifactorial from diabetes, CHF, and hypertension. -cr on presentation was 2.37, cr today  is 1.7 -Continue to monitor with diuresis -renal dosing meds, avoid renal toxin -nephrology input appreciated  Hyperkalemia: K 5.6 on admission.No acute EKG changes. -improved  with Lasix diuresis, on increased lasix   Acute  on chronic anemia hgb 7.8 on presentation, no sign of blood loss, possible component of hemodilution in the setting of significant volume overload History of colonoscopy 5 years ago which was normal per patient report, no history of melena or hematochezia She received  prbcx4 units since admission,  Anemia workup, FOBT negative  Thrombocytopenia plt 88 today Monitor She received platelet transfusion on 4/24 for unclear reason, possibly due to post op bleeding  Hypothyroidism: TSH 1.937 on 03/18/2018. -Continue Synthroid  Seizure disorder: Currently stable. -Continue Depakote  Generalized anxiety disorder: Chronic and stable.  -Continue BuSpar -klonopin 0.25mg  prn, she has not required any since hospitalization, do not plan to prescribe benzo at discharge.  Chronic back pain/arthritis with chronic left knee pain: -Has been taking long-acting morphine and short-acting narcotics. Will continue both for now with hold parameters, may be contributing to her generalized weakness/deconditioning. -Continue morphine 15 mg p.o. 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 Donnie Mesa  -penrose removed as it will not stay in the wound.  Start NS WD dressing changes to buttock wounds BID.  Continue sitz bathes. -she is cleared by surgery to discharge to SNF -she is to follow up with general surgery  Body mass index is 32.82 kg/m.   Code Status: full  Family Communication: patient   Disposition Plan: snf   Consultants:  Nephrology  General surgery  Procedures:  Wound debridement   Antibiotics:  As above   Objective: BP (!) 141/67 (BP Location: Right Arm)   Pulse 65   Temp (!) 97.3 F (36.3 C) (Oral)   Resp 18   Ht 5\' 4"  (1.626 m)   Wt 86.7 kg   SpO2 95%   BMI 32.82 kg/m   Intake/Output Summary (Last 24 hours) at 05/20/2018 2243 Last data filed at 05/20/2018 2053 Gross per 24 hour  Intake 1443 ml  Output 1401 ml  Net 42 ml   Filed Weights   05/18/18 0500 05/19/18 0325 05/20/18 0313  Weight: 83.6 kg 84.4 kg 86.7 kg    Exam: Patient is examined daily including today on 05/20/2018, exams remain the same as of yesterday except that has changed    General:  NAD,chronically ill appearing  Cardiovascular: RRR  Respiratory: CTABL  Abdomen: Soft/ND/NT, positive BS  Musculoskeletal:bilateral lower extremity pitting Edema  Neuro: alert, oriented   Data  Reviewed: Basic Metabolic Panel: Recent Labs  Lab 05/16/18 0325 05/17/18 0351 05/18/18 0547 05/19/18 0427 05/20/18 1252  NA 144 144 144 145 144  K 5.5* 5.3* 5.1 5.4* 5.4*  CL 104 101 103 103 100  CO2 33* 33* 36* 34* 36*  GLUCOSE 133* 150* 106* 56* 104*  BUN 95* 96* 104* 103* 107*  CREATININE 2.14* 1.86* 1.85* 1.75* 1.71*  CALCIUM 8.0* 8.3* 8.3* 8.7* 8.7*  PHOS  --   --   --   --  3.9   Liver Function Tests: Recent Labs  Lab 05/16/18 0325 05/20/18 1252  AST 37  --   ALT 14  --   ALKPHOS 41  --   BILITOT 0.5  --   PROT 4.3*  --   ALBUMIN 1.8* 2.6*   No results for input(s): LIPASE, AMYLASE in the last 168 hours. No  results for input(s): AMMONIA in the last 168 hours. CBC: Recent Labs  Lab 05/17/18 0351 05/17/18 1538 05/18/18 0547 05/19/18 0427 05/20/18 1252  WBC 3.8* 5.5 4.6 7.0 6.5  HGB 7.1* 8.0* 6.9* 8.3* 8.3*  HCT 22.8* 25.4* 22.1* 26.2* 26.4*  MCV 92.3 92.4 93.6 91.6 92.6  PLT 98* 109* 91* 93* 88*   Cardiac Enzymes:   No results for input(s): CKTOTAL, CKMB, CKMBINDEX, TROPONINI in the last 168 hours. BNP (last 3 results) Recent Labs    04/17/18 0202 04/22/18 0520 05/07/18 1555  BNP 3,208.7* 2,365.5* 1,510.5*    ProBNP (last 3 results) No results for input(s): PROBNP in the last 8760 hours.  CBG: Recent Labs  Lab 05/20/18 0308 05/20/18 0645 05/20/18 1137 05/20/18 1645 05/20/18 2119  GLUCAP 95 99 97 93 131*    Recent Results (from the past 240 hour(s))  Surgical pcr screen     Status: Abnormal   Collection Time: 05/13/18  4:53 AM  Result Value Ref Range Status   MRSA, PCR POSITIVE (A) NEGATIVE Final   Staphylococcus aureus POSITIVE (A) NEGATIVE Final    Comment: CRITICAL RESULT CALLED TO, READ BACK BY AND VERIFIED WITH: H.HAMLIN AT 0711 ON [id number] BY SJW (NOTE) The Xpert SA Assay (FDA approved for NASAL specimens in patients 1 years of age and older), is one component of a comprehensive surveillance program. It is not intended to diagnose infection nor to guide or monitor treatment. Performed at [facility], [place] [address] [place], [place] [address]   Aerobic/Anaerobic Culture (surgical/deep wound)     Status: None   Collection Time: 05/13/18 10:27 AM  Result Value Ref Range Status   Specimen Description ABSCESS GLUTEAL  Final   Special Requests PATIENT ON FOLLOWING ANCEF  Final   Gram Stain   Final    RARE WBC PRESENT, PREDOMINANTLY PMN NO ORGANISMS SEEN    Culture   Final    FEW METHICILLIN RESISTANT STAPHYLOCOCCUS AUREUS NO ANAEROBES ISOLATED Performed at [facility], [place] [address] [place], [place] [address]    Report Status 05/18/2018  FINAL  Final   Organism ID, Bacteria METHICILLIN RESISTANT STAPHYLOCOCCUS AUREUS  Final      Susceptibility   Methicillin resistant staphylococcus aureus - MIC*    CIPROFLOXACIN >=8 RESISTANT Resistant     ERYTHROMYCIN >=8 RESISTANT Resistant     GENTAMICIN <=0.5 SENSITIVE Sensitive     OXACILLIN >=4 RESISTANT Resistant     TETRACYCLINE <=1 SENSITIVE Sensitive     VANCOMYCIN 1 SENSITIVE Sensitive     TRIMETH/SULFA <=10 SENSITIVE Sensitive     CLINDAMYCIN <=0.25  SENSITIVE Sensitive     RIFAMPIN <=0.5 SENSITIVE Sensitive     Inducible Clindamycin NEGATIVE Sensitive     * FEW METHICILLIN RESISTANT STAPHYLOCOCCUS AUREUS     Studies: No results found.  Scheduled Meds: . arformoterol  15 mcg Nebulization BID  . atorvastatin  80 mg Oral QHS  . bisacodyl  10 mg Rectal Once  . budesonide  0.5 mg Nebulization BID  . busPIRone  15 mg Oral BID  . carvedilol  25 mg Oral BID WC  . Chlorhexidine Gluconate Cloth  6 each Topical Q0600  . Chlorhexidine Gluconate Cloth  6 each Topical Once  . clopidogrel  75 mg Oral Daily  . divalproex  1,000 mg Oral Daily  . doxycycline  100 mg Oral Q12H  . feeding supplement (GLUCERNA SHAKE)  237 mL Oral QHS  . folic acid  1 mg Oral Daily  . furosemide  80 mg Intravenous Once  . furosemide  80 mg Intravenous Q8H  . hydrALAZINE  50 mg Oral Q8H  . insulin aspart  0-9 Units Subcutaneous TID WC  . insulin glargine  4 Units Subcutaneous QHS  . isosorbide mononitrate  30 mg Oral Daily  . levothyroxine  200 mcg Oral QAC breakfast  . mouth rinse  15 mL Mouth Rinse BID  . Melatonin  3 mg Oral QHS  . morphine  15 mg Oral BID  . multivitamin with minerals  1 tablet Oral Q1200  . nutrition supplement (JUVEN)  1 packet Oral BID BM  . pantoprazole  40 mg Oral Daily  . polyethylene glycol  17 g Oral Daily  . pregabalin  75 mg Oral BID  . senna-docusate  2 tablet Oral BID  . sodium chloride flush  3 mL Intravenous Q12H    Continuous Infusions: . sodium chloride  Stopped (05/08/18 0231)     Time spent: 62mins I have personally reviewed and interpreted on  05/20/2018 daily labs, tele strips, imagings as discussed above under date review session and assessment and plans.  I reviewed all nursing notes, pharmacy notes, consultant notes,  vitals, pertinent old records  I have discussed plan of care as described above with RN , patient  on 05/20/2018   Florencia Reasons MD, PhD  [facility] Pager [phone]. If 7PM-7AM, please contact night-coverage at [email or web address], password [facility] 05/20/2018, 10:43 PM  LOS: 13 days

## 2018-05-20 NOTE — Progress Notes (Signed)
Physical Therapy Treatment Patient Details Name: Andrea [name] MRN: [id number] DOB: 1950/04/30 Today's Date: 05/20/2018    History of Present Illness 68 yo admitted with SOB and weakness from home after leaving SNF 4/13. Pt with CHF and PNA. Pt D/C from hospital to [facility] on 4/5. PMhx: CHF, CAD, breast CA, bil mastectomy, HTn, seizure, migraine, IDDM, CKD    PT Comments    Pt is anxious to get up with therapy today. Pt co-treated with OT due to pt hx of fall last night. Pt min A for bed mobility, transfers and min-modA for ambulation. Pt able to follow commands consistently for therapeutic exercise. Pt had one episode of decreased response and staring into space requiring increased cuing to provide response. Episode passed quickly and pt was again engaged in session. PT will continue to follow acutely.     Follow Up Recommendations  Home health PT;Supervision/Assistance - 24 hour     Equipment Recommendations  None recommended by PT       Precautions / Restrictions Precautions Precautions: Fall Precaution Comments: watch 02 Restrictions Weight Bearing Restrictions: No    Mobility  Bed Mobility Overal bed mobility: Needs Assistance Bed Mobility: Supine to Sit     Supine to sit: Min assist     General bed mobility comments: min A for balance and trunk elevation  Transfers Overall transfer level: Needs assistance Equipment used: Rolling walker (2 wheeled) Transfers: Sit to/from [facility] Sit to Stand: Mod assist Stand pivot transfers: Min assist       General transfer comment: modA for power up into standing, min A for balance  Ambulation/Gait Ambulation/Gait assistance: Min assist;Mod assist Gait Distance (Feet): 15 Feet(2x15) Assistive device: Rolling walker (2 wheeled) Gait Pattern/deviations: Decreased stride length;Step-to pattern Gait velocity: decr  Gait velocity interpretation: <1.31 ft/sec, indicative of household ambulator General  Gait Details: initially minA for steadying progressing to [place] with end of ambulation requiring sitting rest break then able to walk additional 15 feet with only minA       Balance Overall balance assessment: History of Falls;Needs assistance Sitting-balance support: No upper extremity supported;Feet supported Sitting balance-Leahy Scale: Fair     Standing balance support: Bilateral upper extremity supported;During functional activity Standing balance-Leahy Scale: Poor Standing balance comment: requires outside support to maintain standing                             Cognition Arousal/Alertness: Awake/alert Behavior During Therapy: Flat affect Overall Cognitive Status: Impaired/Different from baseline Area of Impairment: Attention;Following commands;Memory;Safety/judgement;Awareness;Problem solving                   Current Attention Level: Focused Memory: Decreased short-term memory Following Commands: Follows one step commands with increased time Safety/Judgement: Decreased awareness of safety Awareness: Emergent Problem Solving: Slow processing;Decreased initiation;Requires verbal cues General Comments: Pt with varying levels of attention/arousal. More participative today. Emotional during session      Exercises General Exercises - Upper Extremity Shoulder Flexion: Strengthening;Right;Left;10 reps;Seated;Theraband Theraband Level (Shoulder Flexion): Level 2 (Red) Shoulder Horizontal ABduction: Strengthening;Both;10 reps;Theraband Theraband Level (Shoulder Horizontal Abduction): Level 2 (Red) Elbow Flexion: Strengthening;Both;10 reps;Seated;Theraband Theraband Level (Elbow Flexion): Level 2 (Red) General Exercises - Lower Extremity Long Arc Quad: AROM;Both;10 reps;Seated Hip Flexion/Marching: AROM;Both;10 reps;Seated    General Comments General comments (skin integrity, edema, etc.): Pt with 1 incidence of "staring off into space" and requiring cues to  come back      Pertinent Vitals/Pain Pain  Assessment: Faces Faces Pain Scale: Hurts even more Pain Location: Buttocks, site of I&D Pain Descriptors / Indicators: Sore;Aching;Discomfort;Grimacing;Guarding Pain Intervention(s): Monitored during session;Repositioned           PT Goals (current goals can now be found in the care plan section) Acute Rehab PT Goals Patient Stated Goal: get stronger and go home PT Goal Formulation: With patient Time For Goal Achievement: 05/22/18 Potential to Achieve Goals: Fair Progress towards PT goals: Progressing toward goals    Frequency    Min 3X/week      PT Plan Current plan remains appropriate    Co-evaluation PT/OT/SLP Co-Evaluation/Treatment: Yes Reason for Co-Treatment: For patient/therapist safety;Necessary to address cognition/behavior during functional activity;To address functional/ADL transfers(changes in cognition and assisted fall last night with RN) PT goals addressed during session: Mobility/safety with mobility OT goals addressed during session: ADL's and self-care      AM-PAC PT "6 Clicks" Mobility   Outcome Measure  Help needed turning from your back to your side while in a flat bed without using bedrails?: None Help needed moving from lying on your back to sitting on the side of a flat bed without using bedrails?: A Little Help needed moving to and from a bed to a chair (including a wheelchair)?: A Little Help needed standing up from a chair using your arms (e.g., wheelchair or bedside chair)?: A Little Help needed to walk in hospital room?: A Lot Help needed climbing 3-5 steps with a railing? : Total 6 Click Score: 16    End of Session Equipment Utilized During Treatment: Gait belt;Oxygen Activity Tolerance: Patient tolerated treatment well Patient left: in bed;with bed alarm set;with nursing/sitter in room Nurse Communication: Mobility status PT Visit Diagnosis: Muscle weakness (generalized)  (M62.81);Unsteadiness on feet (R26.81);Other abnormalities of gait and mobility (R26.89)     Time: 5284-1324 PT Time Calculation (min) (ACUTE ONLY): 52 min  Charges:  $Gait Training: 8-22 mins $Therapeutic Exercise: 8-22 mins                     Alvie Speltz B. Migdalia Dk PT, DPT Acute Rehabilitation Services Pager [phone] Office [phone]    [place] 05/20/2018, 12:54 PM

## 2018-05-20 NOTE — Progress Notes (Signed)
Rn walked in on pt taking off/out dressing and packing for wound on pt bottom. RN redressed and packed wound. Pt educated. Will continue to monitor pt.

## 2018-05-20 NOTE — Progress Notes (Signed)
Pt took off yellow fall risk socks. Pt states "stop trying to put these socks on me. I keep taking them off because I am too hot with them on and there is no point for them". Pt educated and still refuses. Will continue to monitor pt

## 2018-05-20 NOTE — NC FL2 (Signed)
[place] LEVEL OF CARE SCREENING TOOL     IDENTIFICATION  Patient Name: Andrea [name] Birthdate: 11/11/1950 Sex: female Admission Date (Current Location): 05/07/2018  [facility] and [place] Number:  Herbalist and Address:  [facility] [place], [place] [address] [place], [place] [address]      Provider Number: [id number]  Attending Physician Name and Address:  Florencia Reasons, MD  Relative Name and Phone Number:       Current Level of Care: Hospital Recommended Level of Care: [place] Prior Approval Number:    Date Approved/Denied:   PASRR Number: [id number]  Discharge Plan: SNF    Current Diagnoses: Patient Active Problem List   Diagnosis Date Noted  . Pressure injury of skin 05/10/2018  . Cellulitis and abscess of buttock 05/10/2018  . FTT (failure to thrive) in adult   . Bilateral leg edema   . Acute on chronic diastolic congestive heart failure ([place]) 05/07/2018  . CKD (chronic kidney disease), stage III ([place]) 05/07/2018  . Acute on chronic diastolic CHF (congestive heart failure) ([place]) 04/11/2018  . RSV (respiratory syncytial virus pneumonia) 04/11/2018  . DM type 2 (diabetes mellitus, type 2) ([place]) 04/11/2018  . CKD (chronic kidney disease), stage IV ([place]) 04/11/2018  . Acute hypoxemic respiratory failure ([place]) 04/11/2018  . Encephalopathy 03/18/2018  . Hyperkalemia 03/15/2018  . 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 Chronic systolic heart failure ([place]) 02/12/2018  . Acute respiratory failure with hypoxia ([place]) 01/20/2018  . Constipation 01/20/2018  . High risk medication use 01/19/2018  . Chest pain, rule out acute myocardial infarction 01/17/2018  . Drug allergy, multiple 01/08/2018  . History of breast cancer 01/08/2018  . Ischemic chest pain ([place]) 01/04/2018  . Chest pain 01/04/2018  . CAD S/P percutaneous coronary angioplasty 12/30/2017  . Unspecified  abdominal pain 06/02/2017  . C. difficile colitis 03/17/2017  . AKI (acute kidney injury) ([place])   . Diverticulitis 02/17/2017  . Frequent falls 01/08/2017  . Rhabdomyolysis 01/08/2017  . Intractable nausea and vomiting 11/23/2016  . Insulin dependent diabetes mellitus ([place])   . Multiple rib fractures 11/28/2013  . Diarrhea 10/05/2013  . Dyslipidemia 09/16/2013  . History of seizures 08/04/2013  . Chronic pain 07/01/2013  . Anemia 03/16/2013  . Inadequate pain control 03/15/2013  . Cancer of left breast ([place]) 01/20/2013  . Migraine headache 11/10/2012  . 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 Diabetic peripheral neuropathy associated with type 2 diabetes mellitus ([place]) 04/17/2012  . GAD (generalized anxiety disorder) 05/25/2010  . Persistent insomnia 05/25/2010  . Disorder of kidney and ureter 01/26/2009  . Type 2 diabetes mellitus with hyperglycemia, with long-term current use of insulin ([place]) 11/03/2008  . Essential hypertension 09/21/2008  . Esophageal reflux 08/05/2008  . Seizure ([place]) 04/12/2008  . Hypothyroidism 02/27/2007    Orientation RESPIRATION BLADDER Height & Weight     Self, Time, Situation, Place  O2(Nasal Canula 2 L) Incontinent Weight: 191 lb 3.2 oz (86.7 kg) Height:  5\' 4"  (162.6 cm)  BEHAVIORAL SYMPTOMS/MOOD NEUROLOGICAL BOWEL NUTRITION STATUS  (None) Convulsions/Seizures Continent Diet(Carb modified with fluid restriction 1800 mL.)  AMBULATORY STATUS COMMUNICATION OF NEEDS Skin   Limited Assist Verbally Skin abrasions, Bruising, Other (Comment)(Excoriated. Unstageable pressure injuries on left buttocks (Gauze BID) and mid upper buttocks (Gauze BID).)  Personal Care Assistance Level of Assistance  Bathing, Feeding, Dressing Bathing Assistance: Maximum assistance Feeding assistance: Limited assistance Dressing Assistance: Maximum assistance     Functional Limitations Info  Sight,  Hearing, Speech Sight Info: Adequate Hearing Info: Adequate Speech Info: Adequate    SPECIAL CARE FACTORS FREQUENCY  PT (By licensed PT), Blood pressure, OT (By licensed OT)     PT Frequency: 5 x week OT Frequency: 5 x week            Contractures Contractures Info: Not present    Additional Factors Info  Code Status, Allergies, Isolation Precautions Code Status Info: Full code Allergies Info: Ticagrelor, Aspartame And Phenylalanine, Aspirin, Maxzide (Hydrochlorothiazide W-triamterene), Metformin And Related, Nsaids, Other, Pravachol (Pravastatin), Spironolactone, Stadol (Butorphanol), Toradol (Ketorolac Tromethamine), Vistaril (Hydroxyzine Hcl), Erythromycin, Morphine And Related     Isolation Precautions Info: Contact: MRSA     Current Medications (05/20/2018):  This is the current hospital active medication list Current Facility-Administered Medications  Medication Dose Route Frequency Provider Last Rate Last Dose  . 0.9 %  sodium chloride infusion   Intravenous PRN Earnstine Regal, PA-C   Stopped at 05/08/18 0231  . 0.9 % irrigation (POUR BTL)    PRN Erroll Luna, MD   1,000 mL at 05/15/18 0406  . acetaminophen (TYLENOL) tablet 650 mg  650 mg Oral Q6H PRN Earnstine Regal, PA-C   650 mg at 05/14/18 1707   Or  . acetaminophen (TYLENOL) suppository 650 mg  650 mg Rectal Q6H PRN Earnstine Regal, PA-C      . albuterol (PROVENTIL) (2.5 MG/3ML) 0.083% nebulizer solution 2.5 mg  2.5 mg Nebulization Q4H PRN Kc, Ramesh, MD      . arformoterol (BROVANA) nebulizer solution 15 mcg  15 mcg Nebulization BID Earnstine Regal, PA-C   15 mcg at 05/20/18 2202  . atorvastatin (LIPITOR) tablet 80 mg  80 mg Oral QHS Earnstine Regal, PA-C   80 mg at 05/19/18 2135  . barrier cream (non-specified) 1 application  1 application Topical BID PRN Earnstine Regal, PA-C   1 application at 54/27/06 0848  . bisacodyl (DULCOLAX) suppository 10 mg  10 mg Rectal Once Neila Gear, NP   Stopped  at 05/16/18 0033  . budesonide (PULMICORT) nebulizer solution 0.5 mg  0.5 mg Nebulization BID Earnstine Regal, PA-C   0.5 mg at 05/20/18 2376  . busPIRone (BUSPAR) tablet 15 mg  15 mg Oral BID Earnstine Regal, PA-C   15 mg at 05/20/18 1034  . carvedilol (COREG) tablet 25 mg  25 mg Oral BID WC Earnstine Regal, PA-C   25 mg at 05/20/18 0754  . Chlorhexidine Gluconate Cloth 2 % PADS 6 each  6 each Topical Q0600 Earnstine Regal, PA-C   6 each at 05/20/18 0454  . Chlorhexidine Gluconate Cloth 2 % PADS 6 each  6 each Topical Once Cornett, Thomas, MD      . clonazePAM Bobbye [place]) disintegrating tablet 0.25 mg  0.25 mg Oral BID PRN Earnstine Regal, PA-C   0.25 mg at 05/16/18 2332  . clopidogrel (PLAVIX) tablet 75 mg  75 mg Oral Daily Earnstine Regal, PA-C   75 mg at 05/20/18 1034  . divalproex (DEPAKOTE) DR tablet 1,000 mg  1,000 mg Oral Daily Earnstine Regal, PA-C   1,000 mg at 05/20/18 1034  . doxycycline (VIBRA-TABS) tablet 100 mg  100 mg Oral Q12H Earnstine Regal, PA-C   100 mg at 05/20/18 1035  . feeding supplement (GLUCERNA SHAKE) (GLUCERNA SHAKE) liquid 237 mL  237 mL Oral  Cloretta Ned, MD   237 mL at 05/19/18 2136  . folic acid (FOLVITE) tablet 1 mg  1 mg Oral Daily Earnstine Regal, PA-C   1 mg at 05/20/18 1035  . furosemide (LASIX) injection 80 mg  80 mg Intravenous Once Harrie Jeans C, MD      . furosemide (LASIX) injection 80 mg  80 mg Intravenous Q8H [name] B, MD   80 mg at 05/20/18 1359  . hydrALAZINE (APRESOLINE) tablet 50 mg  50 mg Oral Q8H Claudia Desanctis, MD   50 mg at 05/20/18 1357  . insulin aspart (novoLOG) injection 0-9 Units  0-9 Units Subcutaneous TID WC Earnstine Regal, PA-C   1 Units at 05/18/18 9480  . insulin glargine (LANTUS) injection 4 Units  4 Units Subcutaneous QHS Antonieta Pert, MD   4 Units at 05/19/18 2136  . isosorbide mononitrate (IMDUR) 24 hr tablet 30 mg  30 mg Oral Daily Earnstine Regal, PA-C   30 mg at 05/20/18 1035  . levothyroxine  (SYNTHROID) tablet 200 mcg  200 mcg Oral QAC breakfast Earnstine Regal, PA-C   200 mcg at 05/20/18 0754  . MEDLINE mouth rinse  15 mL Mouth Rinse BID Kc, Ramesh, MD   15 mL at 05/20/18 1036  . Melatonin TABS 3 mg  3 mg Oral QHS Earnstine Regal, PA-C   3 mg at 05/19/18 2135  . morphine (MS CONTIN) 12 hr tablet 15 mg  15 mg Oral BID Kc, Ramesh, MD   15 mg at 05/20/18 1035  . multivitamin with minerals tablet 1 tablet  1 tablet Oral Q1200 Eulogio Bear U, DO   1 tablet at 05/20/18 1035  . nutrition supplement (JUVEN) (JUVEN) powder packet 1 packet  1 packet Oral BID BM Eulogio Bear U, DO   1 packet at 05/20/18 1358  . oxyCODONE (Oxy IR/ROXICODONE) immediate release tablet 5 mg  5 mg Oral Q6H PRN Earnstine Regal, PA-C   5 mg at 05/20/18 1357  . pantoprazole (PROTONIX) EC tablet 40 mg  40 mg Oral Daily Earnstine Regal, PA-C   40 mg at 05/20/18 1035  . polyethylene glycol (MIRALAX / GLYCOLAX) packet 17 g  17 g Oral Daily Earnstine Regal, PA-C   17 g at 05/20/18 1034  . pregabalin (LYRICA) capsule 75 mg  75 mg Oral BID Earnstine Regal, PA-C   75 mg at 05/20/18 1035  . promethazine (PHENERGAN) tablet 12.5 mg  12.5 mg Oral Q6H PRN Earnstine Regal, PA-C   12.5 mg at 05/15/18 1034  . senna-docusate (Senokot-S) tablet 2 tablet  2 tablet Oral BID Earnstine Regal, PA-C   2 tablet at 05/20/18 1035  . silver nitrate applicators applicator 10 application  10 application Topical PRN Earnstine Regal, PA-C      . sodium chloride flush (NS) 0.9 % injection 3 mL  3 mL Intravenous Q12H Earnstine Regal, PA-C   3 mL at 05/20/18 1034     Discharge Medications: Please see discharge summary for a list of discharge medications.  Relevant Imaging Results:  Relevant Lab Results:   Additional Information SS#: [id number]. Was at [facility] 3/6-3/21 and [facility] 4/5-4/16. She is willing to complete a Medicaid application and said she might need long-term placement after rehab.  Candie Chroman,  LCSW

## 2018-05-20 NOTE — Progress Notes (Addendum)
Was the fall witnessed: Yes. Assisted fall by Dorna Leitz, Raven D. Pt was lowered to the ground.  Patient condition before and after the fall: Pt was alert and oriented x4, Pt was resting in bed. After fall pt vitals are stable, pt alert and oriented x4.   Patient's reaction to the fall:  Pt states "I was dizzy and out of control".   Name of the doctor that was notified including date and time: Dr. Baltazar Najjar, 05/20/2018, 0035  Any interventions and vital signs: Pt refuses any treatment except requests pain medications. Vital signs: Tempature: 97.3 Oral, BP: 149/64, RR: 18, HR: 72, O2: 97 [place] 2L.   Pt used called light to call staff to use the bed side commode. RN informed that Pt was being transferred from the bed to the bed side commode. NT called out for assistance to help with transfer. Witnessing RN stated NT holding pt. up off of the floor when arrived to room. NT helped guide pt. to the floor onto her knees. Pt. then placed back in bed by with assist by NT and RN. RN noted at that pt. had taken yellow slid proof socks off.  On call for [facility] paged to make aware. No new orders received at this time. VSS. Family notified and message left. Post fall huddle completed. Fall assessment updated, pt. placed in low bed with mats and call light within reach. No new injuries noted to knees. (Previously documented scabbing noted). Pt. c/o knee pain. Refused ice pack. Requested pain medication. RN will continue to monitor pt.

## 2018-05-20 NOTE — TOC Progression Note (Signed)
Transition of Care [facility] - Progression Note    Patient Details  Name: Andrea [name] MRN: [id number] Date of Birth: 07-15-1950  Transition of [facility]) CM/SW [place], LCSW Phone Number: 05/20/2018, 4:04 PM  Clinical Narrative: Readmission prevention screening complete.  Patient now wanting SNF placement. She is in her copay days from recently being at [facility] and [facility]. She is willing to complete a Medicaid application to cover her copays and potentially pay for long-term placement. Provided CMS scores for facilities within 25 miles of her zip code. Will follow up once she has bed offers.  Expected Discharge Plan: [place] Barriers to Discharge: No Barriers Identified  Expected Discharge Plan and Services Expected Discharge Plan: [place] In-house Referral: NA Discharge Planning Services: CM Consult Post Acute Care Choice: [place] arrangements for the past 2 months: Single Family Home                 DME Arranged: N/A         HH Arranged: RN, Disease Management, OT, PT, Nurse's Aide, Social Work [employer] Agency: [facility]         Social Determinants of Health (SDOH) Interventions    Readmission Risk Interventions Readmission Risk Prevention Plan 05/20/2018 04/12/2018  Transportation Screening Complete Complete  Medication Review Press photographer) Complete Complete  PCP or Specialist appointment within 3-5 days of discharge - Complete  HRI or Home Care Consult Complete Complete  SW Recovery Care/Counseling Consult Complete Complete  Palliative Care Screening Not Applicable Not Applicable  Skilled Nursing Facility Complete Complete  Some recent data might be hidden

## 2018-05-20 NOTE — Progress Notes (Signed)
RN just walked out of pt room from cleaning pt bowl movement and urine incontinent episode at 0500.  Pt called at 0509 again about bowel movement episode. RN went to assess pt and there was no bowel movement.

## 2018-05-20 NOTE — Progress Notes (Signed)
Occupational Therapy Treatment Patient Details Name: Andrea [name] MRN: [id number] DOB: [birth year] Today's Date: 05/20/2018    History of present illness 68 yo admitted with SOB and weakness from home after leaving SNF 4/13. Pt with CHF and PNA. Pt D/C from hospital to [place] [place] on 4/5. PMhx: CHF, CAD, breast CA, bil mastectomy, HTn, seizure, migraine, IDDM, CKD   OT comments  Pt seen in conjunction with PT due to assisted fall last night and decreased cognition. Pt was able to perform transfers at mod-min A +1 with RW, standing ADL (oral care, face washing with verbal cues and set up and assist to open up toothbrush) as well as short in room ambulation with close chair follow. She used her theraband to participate in HEP seated in recliner. Pt with one episode of staring off when asked if she was with [place] she said "no". Pt continues to require skilled OT in the acute setting and will need 24 hour supervision at the least at home for safety and to prevent falls. OT will continue to follow acutely.   Follow Up Recommendations  SNF;Home health OT    Equipment Recommendations  None recommended by OT    Recommendations for Other Services      Precautions / Restrictions Precautions Precautions: Fall Precaution Comments: watch 02 Restrictions Weight Bearing Restrictions: No       Mobility Bed Mobility Overal bed mobility: Needs Assistance Bed Mobility: Supine to Sit     Supine to sit: Min assist     General bed mobility comments: min A for balance and trunk elevation  Transfers Overall transfer level: Needs assistance Equipment used: Rolling walker (2 wheeled) Transfers: Sit to/from [facility] Sit to Stand: Mod assist Stand pivot transfers: Min assist       General transfer comment: modA for power up into standing, min A for balance    Balance Overall balance assessment: History of Falls;Needs assistance Sitting-balance support: No upper extremity  supported;Feet supported Sitting balance-Leahy Scale: Fair     Standing balance support: Bilateral upper extremity supported;During functional activity Standing balance-Leahy Scale: Poor Standing balance comment: requires outside support to maintain standing                            ADL either performed or assessed with clinical judgement   ADL Overall ADL's : Needs assistance/impaired     Grooming: Wash/dry hands;Standing;Wash/dry face;Oral care;Cueing for sequencing;Minimal assistance Grooming Details (indicate cue type and reason): required cues to actually initiate and perform task ("wipe your mouth")              Lower Body Dressing: Maximal assistance;Sitting/lateral leans Lower Body Dressing Details (indicate cue type and reason): "I can't bend over to put on my socks" Toilet Transfer: Minimal assistance;Stand-pivot;BSC Toilet Transfer Details (indicate cue type and reason): assist for boost and balance Toileting- Clothing Manipulation and Hygiene: Maximal assistance;+2 for physical assistance;+2 for safety/equipment;Sit to/from stand Toileting - Clothing Manipulation Details (indicate cue type and reason): Pt able to stand with A +1 and second to perform peri care     Functional mobility during ADLs: Minimal assistance;Rolling walker;Cueing for safety       Vision       Perception     Praxis      Cognition Arousal/Alertness: Awake/alert Behavior During Therapy: Flat affect Overall Cognitive Status: Impaired/Different from baseline Area of Impairment: Attention;Following commands;Memory;Safety/judgement;Awareness;Problem solving  Current Attention Level: Focused Memory: Decreased short-term memory Following Commands: Follows one step commands with increased time Safety/Judgement: Decreased awareness of safety Awareness: Emergent Problem Solving: Slow processing;Decreased initiation;Requires verbal cues General  Comments: Pt with varying levels of attention/arousal. More participative today. Emotional during session        Exercises Exercises: General Upper Extremity General Exercises - Upper Extremity Shoulder Flexion: Strengthening;Right;Left;10 reps;Seated;Theraband Theraband Level (Shoulder Flexion): Level 2 (Red) Shoulder Horizontal ABduction: Strengthening;Both;10 reps;Theraband Theraband Level (Shoulder Horizontal Abduction): Level 2 (Red) Elbow Flexion: Strengthening;Both;10 reps;Seated;Theraband Theraband Level (Elbow Flexion): Level 2 (Red)   Shoulder Instructions       General Comments Pt with 1 incidence of "staring off into space" and requiring cues to come back    Pertinent Vitals/ Pain       Pain Assessment: Faces Faces Pain Scale: Hurts even more Pain Location: Buttocks, site of I&D Pain Descriptors / Indicators: Sore;Aching;Discomfort;Grimacing;Guarding Pain Intervention(s): Monitored during session;Repositioned  Home Living                                          Prior Functioning/Environment              Frequency  Min 3X/week        Progress Toward Goals  OT Goals(current goals can now be found in the care plan section)  Progress towards OT goals: Progressing toward goals(decreased cognition today)  Acute Rehab OT Goals Patient Stated Goal: get stronger and go home OT Goal Formulation: With patient Time For Goal Achievement: 05/22/18 Potential to Achieve Goals: Good  Plan Discharge plan remains appropriate;Frequency remains appropriate    Co-evaluation    PT/OT/SLP Co-Evaluation/Treatment: Yes Reason for Co-Treatment: For patient/therapist safety;To address functional/ADL transfers;Necessary to address cognition/behavior during functional activity(changes in cognition and assisted fall last night with RN) PT goals addressed during session: Mobility/safety with mobility;Balance;Proper use of DME;Strengthening/ROM OT goals  addressed during session: ADL's and self-care;Proper use of Adaptive equipment and DME;Strengthening/ROM      AM-PAC OT "6 Clicks" Daily Activity     Outcome Measure   Help from another person eating meals?: None Help from another person taking care of personal grooming?: A Little Help from another person toileting, which includes using toliet, bedpan, or urinal?: A Little Help from another person bathing (including washing, rinsing, drying)?: A Little Help from another person to put on and taking off regular upper body clothing?: None Help from another person to put on and taking off regular lower body clothing?: A Little 6 Click Score: 20    End of Session Equipment Utilized During Treatment: Oxygen  OT Visit Diagnosis: Muscle weakness (generalized) (M62.81);Adult, failure to thrive (R62.7)   Activity Tolerance Patient tolerated treatment well   Patient Left Other (comment)(doing sits bath with RN in room)   Nurse Communication Mobility status        Time: 0623-7628 OT Time Calculation (min): 43 min  Charges: OT General Charges $OT Visit: 1 Visit OT Treatments $Self Care/Home Management : 8-22 mins  Hulda Humphrey OTR/L Acute Rehabilitation Services Pager: [phone] Office: [place] 05/20/2018, 12:11 PM

## 2018-05-21 DIAGNOSIS — Z87898 Personal history of other specified conditions: Secondary | ICD-10-CM

## 2018-05-21 LAB — BASIC METABOLIC PANEL
Anion gap: 10 (ref 5–15)
BUN: 113 mg/dL — ABNORMAL HIGH (ref 8–23)
CO2: 35 mmol/L — ABNORMAL HIGH (ref 22–32)
Calcium: 8.6 mg/dL — ABNORMAL LOW (ref 8.9–10.3)
Chloride: 99 mmol/L (ref 98–111)
Creatinine, Ser: 2.07 mg/dL — ABNORMAL HIGH (ref 0.44–1.00)
GFR calc Af Amer: 28 mL/min — ABNORMAL LOW (ref >60)
GFR calc non Af Amer: 24 mL/min — ABNORMAL LOW (ref >60)
Glucose, Bld: 92 mg/dL (ref 70–99)
Potassium: 5.3 mmol/L — ABNORMAL HIGH (ref 3.5–5.1)
Sodium: 144 mmol/L (ref 135–145)

## 2018-05-21 LAB — GLUCOSE, CAPILLARY
Glucose-Capillary: 105 mg/dL — ABNORMAL HIGH (ref 70–99)
Glucose-Capillary: 96 mg/dL (ref 70–99)
Glucose-Capillary: 102 mg/dL — ABNORMAL HIGH (ref 70–99)

## 2018-05-21 MED ORDER — PROMETHAZINE HCL 12.5 MG PO TABS: 12.5000 mg | ORAL_TABLET | Freq: Four times a day (QID) | ORAL | 0 refills | Status: AC | PRN

## 2018-05-21 MED ORDER — BUSPIRONE HCL 15 MG PO TABS: 15.0000 mg | ORAL_TABLET | Freq: Two times a day (BID) | ORAL | 11 refills | Status: AC

## 2018-05-21 MED ORDER — FUROSEMIDE 10 MG/ML IJ SOLN: 80.0000 mg | Freq: Every day | INTRAMUSCULAR | 0 refills | Status: AC

## 2018-05-21 MED ORDER — OMEPRAZOLE 20 MG PO CPDR: 20.0000 mg | DELAYED_RELEASE_CAPSULE | Freq: Every day | ORAL | Status: AC

## 2018-05-21 MED ORDER — INSULIN ASPART 100 UNIT/ML ~~LOC~~ SOLN: 0.0000 [IU] | Freq: Three times a day (TID) | SUBCUTANEOUS | 11 refills | Status: AC

## 2018-05-21 MED ORDER — JUVEN PO PACK: 1.0000 | PACK | Freq: Two times a day (BID) | ORAL | 0 refills | Status: AC

## 2018-05-21 MED ORDER — DIVALPROEX SODIUM 500 MG PO DR TAB: 1000.0000 mg | DELAYED_RELEASE_TABLET | Freq: Every day | ORAL | Status: AC

## 2018-05-21 MED ORDER — SILVER NITRATE-POT NITRATE 75-25 % EX MISC: 10.0000 "application " | CUTANEOUS | 0 refills | Status: AC | PRN

## 2018-05-21 MED ORDER — GLUCERNA SHAKE PO LIQD: 237.0000 mL | Freq: Every day | ORAL | 0 refills | Status: AC

## 2018-05-21 MED ORDER — FUROSEMIDE 10 MG/ML IJ SOLN: 80.0000 mg | Freq: Every day | INTRAMUSCULAR | Status: DC

## 2018-05-21 MED ORDER — POLYETHYLENE GLYCOL 3350 17 G PO PACK: 17.0000 g | PACK | Freq: Every day | ORAL | 0 refills | Status: AC

## 2018-05-21 MED ORDER — HYDRALAZINE HCL 50 MG PO TABS: 50.0000 mg | ORAL_TABLET | Freq: Three times a day (TID) | ORAL | Status: AC

## 2018-05-21 MED ORDER — FOLIC ACID 1 MG PO TABS: 1.0000 mg | ORAL_TABLET | Freq: Every day | ORAL | Status: AC

## 2018-05-21 MED ORDER — OXYCODONE HCL 5 MG PO TABS: 5.0000 mg | ORAL_TABLET | Freq: Four times a day (QID) | ORAL | 0 refills | Status: AC | PRN

## 2018-05-21 MED ORDER — MELATONIN 5 MG PO TABS: 5.0000 mg | ORAL_TABLET | Freq: Every day | ORAL | 0 refills | Status: AC

## 2018-05-21 MED ORDER — BARRIER CREAM NON-SPECIFIED: 1.0000 "application " | TOPICAL_CREAM | Freq: Two times a day (BID) | TOPICAL | Status: AC | PRN

## 2018-05-21 MED ORDER — INSULIN GLARGINE 100 UNIT/ML ~~LOC~~ SOLN: 4.0000 [IU] | Freq: Every day | SUBCUTANEOUS | 11 refills | Status: AC

## 2018-05-21 NOTE — Progress Notes (Signed)
Pt is High risk for fall, pt is complaining about the side rails placed up.repeatedly  Explained the reason for fall precaution.

## 2018-05-21 NOTE — Progress Notes (Signed)
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 1.8L UOP quantified + 2 unmeasured voids . SCr 2.07 and K 5.3 . Weight stable . Less edema . Working with PT  04/29 0701 - 04/30 0700 In: 3419 [P.O.:1710; I.V.:3] Out: 1805 [Urine:1805]  Filed Weights   05/19/18 0325 05/20/18 0313 05/21/18 0516  Weight: 84.4 kg 86.7 kg 85.2 kg    Scheduled Meds: . arformoterol  15 mcg Nebulization BID  . atorvastatin  80 mg Oral QHS  . bisacodyl  10 mg Rectal Once  . budesonide  0.5 mg Nebulization BID  . busPIRone  15 mg Oral BID  . carvedilol  25 mg Oral BID WC  . Chlorhexidine Gluconate Cloth  6 each Topical Q0600  . Chlorhexidine Gluconate Cloth  6 each Topical Once  . clopidogrel  75 mg Oral Daily  . divalproex  1,000 mg Oral Daily  . doxycycline  100 mg Oral Q12H  . feeding supplement (GLUCERNA SHAKE)  237 mL Oral QHS  . folic acid  1 mg Oral Daily  . furosemide  80 mg Intravenous Once  . [START ON 05/22/2018] furosemide  80 mg Intravenous Daily  . hydrALAZINE  50 mg Oral Q8H  . insulin aspart  0-9 Units Subcutaneous TID WC  . insulin glargine  4 Units Subcutaneous QHS  . isosorbide mononitrate  30 mg Oral Daily  . levothyroxine  200 mcg Oral QAC breakfast  . mouth rinse  15 mL Mouth Rinse BID  . Melatonin  3 mg Oral QHS  . morphine  15 mg Oral BID  . multivitamin with minerals  1 tablet Oral Q1200  . nutrition supplement (JUVEN)  1 packet Oral BID BM  . pantoprazole  40 mg Oral Daily  . polyethylene glycol  17 g Oral Daily  . pregabalin  75 mg Oral BID  . senna-docusate  2 tablet Oral BID  . sodium chloride flush  3 mL Intravenous Q12H   Continuous Infusions: . sodium chloride Stopped (05/08/18 0231)   PRN Meds:.sodium chloride, 0.9 % irrigation (POUR BTL),  acetaminophen **OR** acetaminophen, albuterol, barrier cream, clonazePAM, oxyCODONE, promethazine, silver nitrate applicators  Current Labs: reviewed  Results for Andrea, [name] (MRN [id number]) as of 05/18/2018 10:23  Ref. Range 05/10/2018 05:05  Saturation Ratios Latest Ref Range: 10.4 - 31.8 % 34 (H)    Physical Exam:  Blood pressure 139/67, pulse 68, temperature (!) 97.2 F (36.2 C), temperature source Oral, resp. rate 18, height 5\' 4"  (1.626 m), weight 85.2 kg, SpO2 92 %. Chronically ill appearing NAD RRR nl s1s2 CTAB 2+ LEE No rashes/lesions Nonfocal  A 1. CKD4 with improving hypervolemia, nephrotic proteinuria and historical serological workup,  2. Aoc dCHF, hypervolemia; diuretic responsive 3. Hyperkalemia, prev on Lokelma; stable < 5.5 4. Anemia, multifactorial presumed related to #5, #1 5. S/p I&D of buttock abscess x2; doxy; CCS following 6. HTN 7. Azotemeia related to #1 and #2 + protein supplments   P . Reduce Lasix to once daily, follow renal function . If K > 5.5 would use Veltassa 8.4gm to 16.8gm  . Continue sodium and fluid restriction . Daily weights, Daily Renal Panel, Strict I/Os, Avoid nephrotoxins (NSAIDs, judicious IV Contrast)  . Medication Issues; o Preferred narcotic agents for pain control are hydromorphone, fentanyl, and methadone. Morphine should not be used.  o Baclofen should be avoided o Avoid oral sodium phosphate and magnesium citrate based laxatives / bowel preps    [name] MD 05/21/2018, 12:36 PM  Recent Labs  Lab 05/19/18 0427 05/20/18 1252 05/21/18 0753  NA 145 144 144  K 5.4* 5.4* 5.3*  CL 103 100 99  CO2 34* 36* 35*  GLUCOSE 56* 104* 92  BUN 103* 107* 113*  CREATININE 1.75* 1.71* 2.07*  CALCIUM 8.7* 8.7* 8.6*  PHOS  --  3.9  --    Recent Labs  Lab 05/18/18 0547 05/19/18 0427 05/20/18 1252  WBC 4.6 7.0 6.5  HGB 6.9* 8.3* 8.3*  HCT 22.1* 26.2* 26.4*  MCV 93.6 91.6 92.6  PLT 91* 93* 88*

## 2018-05-21 NOTE — Progress Notes (Addendum)
Physical Therapy Treatment Patient Details Name: Andrea [name] MRN: [id number] DOB: 1950/11/04 Today's Date: 05/21/2018    History of Present Illness 68 yo admitted with SOB and weakness from home after leaving SNF 4/13. Pt with CHF and PNA. Pt D/C from hospital to [place] on 4/5. PMhx: CHF, CAD, breast CA, bil mastectomy, HTn, seizure, migraine, IDDM, CKD    PT Comments    Pt very willing to participate and now reports she is agreeable to SNF for wound care, safety and increasing mobility prior to return home. Pt with decreased gait tolerance and cognition this session compared to prior sessions with this therapist but improving based on overall performance this week. Pt educated for bil LE HEp with exercises written out and emphasis on performing repeated sit to stand which pt needs to continue to work on. She was able to complete 10 in 70 sec with cues and tactile assist. Will continue to follow and recommend increased mobility with nursing.   SpO2 93% on RA at rest and left on RA end of session with HR 67 and RN aware Ambulated on 2L with sats 99% end of gait   Follow Up Recommendations  SNF;Supervision/Assistance - 24 hour(pt reports she has decided to go to SNF)     Equipment Recommendations  None recommended by PT    Recommendations for Other Services       Precautions / Restrictions Precautions Precautions: Fall Precaution Comments: watch 02, Left buttock wound    Mobility  Bed Mobility     General bed mobility comments: at [facility] on arrival  Transfers Overall transfer level: Needs assistance Equipment used: Rolling walker (2 wheeled) Transfers: Sit to/from Stand Sit to Stand: Min assist         General transfer comment: min assist with mod tactile cues to stand without use of armrest with armrest able to stand with minguard. Performed repeated sit to stand x 10 from recliner without hands with tactile cues and verbal cues  Ambulation/Gait Ambulation/Gait  assistance: Min assist Gait Distance (Feet): 240 Feet Assistive device: Rolling walker (2 wheeled) Gait Pattern/deviations: Decreased stride length;Step-through pattern;Trunk flexed   Gait velocity interpretation: 1.31 - 2.62 ft/sec, indicative of limited community ambulator General Gait Details: cues for posture and position in RW with cues for directional change to not become tangled in tubing   Stairs             Wheelchair Mobility    Modified Rankin (Stroke Patients Only)       Balance Overall balance assessment: History of Falls;Needs assistance Sitting-balance support: No upper extremity supported;Feet supported Sitting balance-Leahy Scale: Fair     Standing balance support: No upper extremity supported;Bilateral upper extremity supported Standing balance-Leahy Scale: Fair Standing balance comment: able to stand without UE use briefly but requires bil UE support for gait                            Cognition Arousal/Alertness: Awake/alert Behavior During Therapy: Flat affect Overall Cognitive Status: Impaired/Different from baseline Area of Impairment: Attention;Following commands;Memory;Safety/judgement;Awareness;Problem solving                   Current Attention Level: Selective Memory: Decreased short-term memory Following Commands: Follows one step commands with increased time Safety/Judgement: Decreased awareness of safety;Decreased awareness of deficits Awareness: Emergent Problem Solving: Slow processing General Comments: pt having difficulty with problem solving and multistep command following      Exercises General Exercises -  Lower Extremity Long Arc Quad: AROM;Both;Seated;15 reps Hip Flexion/Marching: AROM;Both;Seated;15 reps    General Comments General comments (skin integrity, edema, etc.): Pt on RA for sink level grooming and bathrooming, O2 was at 91%. Put back on O2 for hallway ambulation with PT      Pertinent  Vitals/Pain Pain Assessment: Faces Pain Score: 5  Faces Pain Scale: Hurts even more Pain Location: Buttocks, site of I&D Pain Descriptors / Indicators: Sore;Aching;Discomfort;Grimacing;Guarding Pain Intervention(s): Limited activity within patient's tolerance;Monitored during session;Repositioned    Home Living                      Prior Function            PT Goals (current goals can now be found in the care plan section) Acute Rehab PT Goals Patient Stated Goal: get stronger and go home Progress towards PT goals: Progressing toward goals    Frequency           PT Plan Current plan remains appropriate    Co-evaluation              AM-PAC PT "6 Clicks" Mobility   Outcome Measure  Help needed turning from your back to your side while in a flat bed without using bedrails?: None Help needed moving from lying on your back to sitting on the side of a flat bed without using bedrails?: A Little Help needed moving to and from a bed to a chair (including a wheelchair)?: A Little Help needed standing up from a chair using your arms (e.g., wheelchair or bedside chair)?: A Little Help needed to walk in hospital room?: A Little Help needed climbing 3-5 steps with a railing? : A Lot 6 Click Score: 18    End of Session Equipment Utilized During Treatment: Gait belt;Oxygen Activity Tolerance: Patient tolerated treatment well Patient left: in chair;with chair alarm set;with call bell/phone within reach Nurse Communication: Mobility status PT Visit Diagnosis: Muscle weakness (generalized) (M62.81);Unsteadiness on feet (R26.81);Other abnormalities of gait and mobility (R26.89)     Time: 2800-3491 PT Time Calculation (min) (ACUTE ONLY): 28 min  Charges:  $Gait Training: 8-22 mins $Therapeutic Exercise: 8-22 mins                     Andrea [name], PT Acute Rehabilitation Services Pager: [phone] Office: [place] 05/21/2018,  10:54 AM

## 2018-05-21 NOTE — Progress Notes (Signed)
Assisted in sitz bath. Wound dressing change done. Will moitor

## 2018-05-21 NOTE — Progress Notes (Addendum)
Patient possibly qualify for LTAC placement; received call from [facility] at [facility]; she stated that her insurance may possibly waive the 3 day inpatient ICU stay; awaiting call back. Mindi Slicker [facility] [phone]  1:59 pm - Received message from [facility] with [place] has approved her for admission now awaiting on insurance approval. B Kennie Snedden RN,MHA,BSN  2:20 pm- Received call from Raquel Sarna, she has Biochemist, clinical for [employer]; CM talked to patient, she is in agreement with the plan and wants to go today; Raquel Sarna with [facility] talked to her via telephone, all questions answered. Patient's son [name] called and updated. Awaiting on room number at [place]. She will travel via [place]. B Aracelys Glade RN,MHA,BSN  3:30 pm - awaiting on accepting MD and room number at [facility]; Mindi Slicker RN,MHA,BSN  3:53 pm - Accepting MD - Dr Barbie Haggis; room # [id number]; report to be called to [phone]; Tia RN updated; Mindi Slicker RN,MHA,BSN

## 2018-05-21 NOTE — Discharge Summary (Addendum)
Discharge Summary  Andrea [name] ZHG:[id number] DOB: Aug 13, 1950  PCP: Medicine, [place] date: 05/07/2018 Discharge date: 05/21/2018  Time spent: 35mins, more than 50% time spent on coordination of care.  Patient is discharged to [facility] for ongoing care due to potential COVID overflow at [facility] hospital   nephrology will continue follow patient while at [place] [facility]   Discharge Diagnoses:  Active Hospital Problems   Diagnosis Date Noted   Acute on chronic diastolic congestive heart failure ([place]) 05/07/2018   Pressure injury of skin 05/10/2018   Cellulitis and abscess of buttock 05/10/2018   FTT (failure to thrive) in adult    Bilateral leg edema    CKD (chronic kidney disease), stage III (HCC) 05/07/2018   Hyperkalemia 03/15/2018   CAD S/P percutaneous coronary angioplasty 12/30/2017   Insulin dependent diabetes mellitus ([place])    History of seizures 08/04/2013   Chronic pain 07/01/2013   Anemia 03/16/2013   GAD (generalized anxiety disorder) 05/25/2010   Essential hypertension 09/21/2008   Seizure ([place]) 04/12/2008   Hypothyroidism 02/27/2007    Resolved Hospital Problems  No resolved problems to display.    Discharge Condition: stable  Diet recommendation: heart healthy/carb modified  Filed Weights   05/19/18 0325 05/20/18 0313 05/21/18 0516  Weight: 84.4 kg 86.7 kg 85.2 kg    History of present illness: (per admitting MD Dr Posey Pronto) PCP: Medicine, [facility]  Patient coming from: Home  I have personally briefly reviewed patient's old medical records in [place]  Chief Complaint: Shortness of breath and productive cough, generalized weakness  HPI: Andrea [name] is a 68 y.o. female with medical history significant for chronic diastolic CHF (EF 19-62%), 3V CAD s/p PCI of proximal and distal LAD w/ DES x 2 (12/30/2017), insulin-dependent diabetes, hypertension,  hypothyroidism, CKD Stage III/IV, seizure disorder, hyperlipidemia, generalized anxiety disorder, and chronic back pain who presents to the ED with worsening shortness of breath and cough productive of brown sputum.  Patient was recently admitted from 04/11/2018-04/26/2018 for acute respiratory failure with hypoxia secondary to multilobar pneumonia from RSV and CHF exacerbation.  She was treated with IV Zosyn and diuretics with improvement and discharged to [facility] rehab facility.  She apparently self discharged herself on 05/04/2018 and return to home.  She says she has been feeling generally weak and unable to get up out of bed due to weakness and progressive swelling in both of her legs.  She says she has been having a new productive cough of brown sputum since leaving a rehab facility.  She reports associated musculoskeletal chest pain with frequent coughing but denies typical chest pain.  She has been having increased orthopnea.  She reports one episode of nonbloody emesis on the day of admission.  She denies any subjective fevers, diaphoresis, chills, palpitations, abdominal pain, dysuria, diarrhea.  She saw small scant bloody sputum once.  She otherwise denies any obvious bleeding including epistaxis, hematemesis, hematuria, hematochezia, or melena.  Of note she has a history of CAD status post DES 12/2017 and was previously on Brilinta however had vaginal bleeding, epistaxis, and blood-tinged phlegm.  Due to the symptoms on her last admission she would switch to Plavix due to her high risk of stent thrombosis without antiplatelet therapy.  ED Course:  In the ED, initial vitals showed BP 133/60, pulse 81, RR 19, temp 97.8 Fahrenheit, SPO2 100% on 2 L supplemental O2 via [place].  Labs are notable for WBC 6.9, hemoglobin  7.8, platelets 118, potassium 5.6, BUN 120, creatinine 2.37, GFR 21, BNP 1510.5, troponin I 0.04, FOBT negative, SARS-CoV-2 negative.  Portable chest x-ray showed airspace  consolidation throughout the right lung with superimposed right effusion.    CT chest without contrast was obtained which did not show substantial interval change in exam compared to prior.  Some interval aeration of the left lower lobe with worsening collapse in the right middle lobe and stable consolidation of the right lower lobe were noted.  Right pleural effusion slightly increased from prior with stable small left pleural effusion.  Interlobular septal thickening and patchy groundglass in upper lobes is noted and similar to prior.  The hospitalist service was consulted to admit for further evaluation and management of acute on chronic CHF.  Hospital Course:  Principal Problem:   Acute on chronic diastolic congestive heart failure (HCC) Active Problems:   Essential hypertension   Insulin dependent diabetes mellitus (HCC)   Hypothyroidism   History of seizures   Chronic pain   CAD S/P percutaneous coronary angioplasty   Seizure (HCC)   Anemia   GAD (generalized anxiety disorder)   Hyperkalemia   CKD (chronic kidney disease), stage III (HCC)   Bilateral leg edema   FTT (failure to thrive) in adult   Pressure injury of skin   Cellulitis and abscess of buttock   Acute on chronic diastolic CHF on chronic o2 2L [place] nightly, prn during day time at baseline: -Patient with30lbs weight gain ,significant volume overload on admission with bilateral lower extremity 3+ pitting edema up to thigh/flank, -venous dopplernegative forDVT -she has slightly increased right pleural effusion from prior although BNP is improved from last admission. -she is started on IV Lasix 40 mg twice daily on admission, lasix dose increased to 80mg  bid on 4/18, then iv lasix 80mg  tid, lasix changed to 80mg  daily from 5/1 -she report she is allergic to spironolactone  -Continue home carvedilol/hydralazine/imdur -Strict I/O's, daily weights -nephrology input appreciated, nephrology will continue follow  patient at LTAC  Bilateral lung infiltrate, viral pneumonia vs from chf -RSV + on this admission, interestingly she was tested + for RSV on 3/21 during last hospitalization as well, she might have ongoing RSV shedding for the last three weeks -COVID19 testing on 4/19 is negative -procalcitonin 0.11 -She is afebrile, no leukocytosis, no lymphopenia - congested cough, rhonchi initially, now lungs are clear, - cotinueMucinex,nebs,supportive care, continue diuresis  CADs/p PCI of proximal and distal LAD w/ DES x 2 (12/30/2017): Chronic and stable without typical chest pain.  Patient not on aspirin due to prior allergy. She was previously on Brilinta after DES placement however this was switched to Plavix on recent admission. It was recommended she continue Plavix due to risk for stent thrombosis without antiplatelet therapy  Insulin-dependent diabetes: A1c 6.4 on 03/15/2018. -Continue Lantus plus sensitive SSI, adjust prn  AKI onCKDIII Creatinine and GFR relatively stable compared to recent baseline however slowly worsening over the last 6 months. Likely multifactorial from diabetes, CHF, and hypertension. -cr on presentation was 2.37, cr fluctuating, lowest was at 1.44,  -Continue to monitor with diuresis -renal dosing meds, avoid renal toxin -nephrology input appreciated  Hyperkalemia: K 5.6 on admission.No acute EKG changes. -improvedwith Lasix diuresis, on increased lasix  -per nephrology "If K > 5.5 would use [place] 8.4gm to 16.8gm"  Acute on chronic anemia hgb 7.8 on presentation, no sign of blood loss, possible component of hemodilution in the setting of significant volume overload History of colonoscopy 5 years  ago which was normal per patient report, no history of melena or hematochezia She received  prbcx4 units since admission, Anemia workup, FOBT negative  Thrombocytopenia plt 88 today Monitor She received platelet transfusion on 4/24 for unclear  reason, possibly due to post op bleeding from buttock wound  Hypothyroidism: TSH 1.937 on 03/18/2018. -Continue Synthroid  Seizure disorder: Currently stable. -Continue Depakote  Generalized anxiety disorder: Chronic and stable.  -Continue BuSpar -klonopin 0.25mg  prn, she has not required any since hospitalization, do not plan to prescribe benzo at discharge.  Chronic back pain/arthritis with chronic left knee pain: -Has been taking long-acting morphine and short-acting narcotics. Will continue both for now with hold parameters, may be contributing to her generalized weakness/deconditioning. -Continue morphine 15 mg p.o. twice daily -Continue OxyIR 5 mg, reduce frequency to every 6 hours as needed  "Per chart review, She was hospitalized in 03/2018 due to encephalopathy thought due to narcotic use, will close monitor mental status, it was recommended ms contin 15mg  bid with a goal of slowly to wean off narcotic,  lyrica and oxycodone was stopped at that time"   Hx of Breast CA s/p Bilateral mastectomies + chemo -Will need outpatient follow up with PCP      MRSA colonizationon contact precaution    Subcutaneous abscess (4 x 3 x 3 cm and 2 x 2 x 2 cm) left buttock S/PIncision, drainage, and sharp debridement of 2 subcutaneous abscesses of the left buttock, 05/13/18, Dr. Donnie Mesa  -wound culture + MRSA, she finished doxycycline treatment in the hospital -penrose removed as it will not stay in the wound. Start NS WD dressing changes to buttock wounds BID. Continue sitz bathes. -she is cleared by surgery to discharge to SNF -she is to follow up with general surgery  Left labial redness :appears to be draining,continue heart compression  Body mass index is 32.24 kg/m.   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, she reports started to use a walker to get around for a year She initially refused to go back to SNF,  now agrees to SNF or LTAC Appreciate social Development worker, community assistance    Code Status: full  Family Communication: patient   Disposition Plan:  LTAC    Consultants:  Nephrology  General surgery  Procedures:  Wound debridement   Antibiotics:  As above   Discharge Exam: BP 139/67    Pulse 68    Temp (!) 97.2 F (36.2 C) (Oral)    Resp 18    Ht 5\' 4"  (1.626 m)    Wt 85.2 kg    SpO2 92%    BMI 32.24 kg/m    General:  NAD,chronically ill appearing  Cardiovascular: RRR  Respiratory: CTABL  Abdomen: Soft/ND/NT, positive BS  Musculoskeletal:bilateral lower extremity pitting Edema  Neuro: alert, oriented   Discharge Instructions You were cared for by a hospitalist during your hospital stay. If you have any questions about your discharge medications or the care you received while you were in the hospital after you are discharged, you can call the unit and asked to speak with the hospitalist on call if the hospitalist that took care of you is not available. Once you are discharged, your primary care physician will handle any further medical issues. Please note that NO REFILLS for any discharge medications will be authorized once you are discharged, as it is imperative that you return to your primary care physician (or establish a relationship with a primary care  physician if you do not have one) for your aftercare needs so that they can reassess your need for medications and monitor your lab values.  Discharge Instructions    Diet - low sodium heart healthy   Complete by:  As directed    Fluids restriction to1200cc per day   Increase activity slowly   Complete by:  As directed      Allergies as of 05/21/2018      Reactions   Ticagrelor Rash   Aspartame And Phenylalanine Hives, Diarrhea   Aspirin Nausea And Vomiting   Maxzide [hydrochlorothiazide W-triamterene] Swelling   Fluid retention   Metformin And Related Diarrhea   Nsaids Nausea And Vomiting    Other Other (See Comments)   All steroids produce psychosis per pt Artificial sweeteners produce nausea and upset stomach.   Pravachol [pravastatin] Other (See Comments)   unknown   Spironolactone    Stadol [butorphanol] Other (See Comments)   Toradol, etc And related- hallucinations   Toradol [ketorolac Tromethamine] Other (See Comments)   Hallucinations   Vistaril [hydroxyzine Hcl] Other (See Comments)   Erythromycin Itching, Rash   Morphine And Related Hives, Rash   Morphine given via IV      Medication List    STOP taking these medications   furosemide 40 MG tablet Commonly known as:  Lasix Replaced by:  furosemide 10 MG/ML injection   LORazepam 0.5 MG tablet Commonly known as:  ATIVAN   predniSONE 10 MG tablet Commonly known as:  DELTASONE   spironolactone 25 MG tablet Commonly known as:  ALDACTONE     TAKE these medications   acetaminophen 325 MG tablet Commonly known as:  TYLENOL Take 2 tablets (650 mg total) by mouth every 6 (six) hours as needed for mild pain, fever or headache.   arformoterol 15 MCG/2ML Nebu Commonly known as:  BROVANA Take 2 mLs (15 mcg total) by nebulization 2 (two) times daily.   atorvastatin 80 MG tablet Commonly known as:  LIPITOR Take 1 tablet (80 mg total) by mouth at bedtime.   barrier cream Crea Commonly known as:  non-specified Apply 1 application topically 2 (two) times daily as needed.   budesonide 0.5 MG/2ML nebulizer solution Commonly known as:  PULMICORT Take 2 mLs (0.5 mg total) by nebulization 2 (two) times daily.   busPIRone 15 MG tablet Commonly known as:  BUSPAR Take 1 tablet (15 mg total) by mouth 2 (two) times daily.   carvedilol 25 MG tablet Commonly known as:  COREG Take 1 tablet (25 mg total) by mouth 2 (two) times daily with a meal. What changed:  Another medication with the same name was removed. Continue taking this medication, and follow the directions you see here.   clopidogrel 75 MG  tablet Commonly known as:  Plavix Take 1 tablet (75 mg total) by mouth daily.   divalproex 500 MG DR tablet Commonly known as:  DEPAKOTE Take 2 tablets (1,000 mg total) by mouth daily.   feeding supplement (GLUCERNA SHAKE) Liqd Take 237 mLs by mouth at bedtime.   nutrition supplement (JUVEN) Pack Take 1 packet by mouth 2 (two) times daily between meals. Start taking on:  May 22, 5463   folic acid 1 MG tablet Commonly known as:  FOLVITE Take 1 tablet (1 mg total) by mouth daily. Start taking on:  May 22, 2018   furosemide 10 MG/ML injection Commonly known as:  LASIX Inject 8 mLs (80 mg total) into the vein daily. Start taking on:  May 22, 2018 Replaces:  furosemide 40 MG tablet   guaiFENesin 600 MG 12 hr tablet Commonly known as:  MUCINEX Take 2 tablets (1,200 mg total) by mouth 2 (two) times daily.   hydrALAZINE 50 MG tablet Commonly known as:  APRESOLINE Take 1 tablet (50 mg total) by mouth every 8 (eight) hours. What changed:    medication strength  how much to take   insulin aspart 100 UNIT/ML injection Commonly known as:  novoLOG Inject 0-9 Units into the skin 3 (three) times daily with meals.   insulin glargine 100 UNIT/ML injection Commonly known as:  LANTUS Inject 0.04 mLs (4 Units total) into the skin at bedtime. What changed:  how much to take   isosorbide mononitrate 30 MG 24 hr tablet Commonly known as:  IMDUR Take 1 tablet (30 mg total) by mouth daily.   levothyroxine 200 MCG tablet Commonly known as:  SYNTHROID Take 1 tablet (200 mcg total) by mouth at bedtime.   Melatonin 5 MG Tabs Take 1 tablet (5 mg total) by mouth at bedtime.   morphine 15 MG 12 hr tablet Commonly known as:  MS CONTIN Take 1 tablet (15 mg total) by mouth 2 (two) times daily.   multivitamin with minerals Tabs tablet Take 1 tablet by mouth daily.   omeprazole 20 MG capsule Commonly known as:  PRILOSEC Take 1 capsule (20 mg total) by mouth daily.   oxyCODONE 5 MG  immediate release tablet Commonly known as:  Oxy IR/ROXICODONE Take 1 tablet (5 mg total) by mouth every 6 (six) hours as needed for severe pain (Hold & Call MD if SBP<90, HR<65, RR<10, O2<90, or altered mental status.). What changed:    how much to take  when to take this  reasons to take this   polyethylene glycol 17 g packet Commonly known as:  MIRALAX / GLYCOLAX Take 17 g by mouth daily. Start taking on:  May 22, 2018   pregabalin 75 MG capsule Commonly known as:  LYRICA Take 75 mg by mouth 2 (two) times daily.   promethazine 12.5 MG tablet Commonly known as:  PHENERGAN Take 1 tablet (12.5 mg total) by mouth every 6 (six) hours as needed for nausea or vomiting. What changed:    medication strength  how much to take  reasons to take this   senna-docusate 8.6-50 MG tablet Commonly known as:  Senokot-S Take 2 tablets by mouth 2 (two) times daily.   silver nitrate applicators 11-03 % applicator Apply 10 Sticks (10 application total) topically as needed for bleeding.      Allergies  Allergen Reactions   Ticagrelor Rash   Aspartame And Phenylalanine Hives and Diarrhea   Aspirin Nausea And Vomiting   Maxzide [Hydrochlorothiazide W-Triamterene] Swelling    Fluid retention   Metformin And Related Diarrhea   Nsaids Nausea And Vomiting   Other Other (See Comments)    All steroids produce psychosis per pt Artificial sweeteners produce nausea and upset stomach.   Pravachol [Pravastatin] Other (See Comments)    unknown   Spironolactone    Stadol [Butorphanol] Other (See Comments)    Toradol, etc And related- hallucinations   Toradol [Ketorolac Tromethamine] Other (See Comments)    Hallucinations    Vistaril [Hydroxyzine Hcl] Other (See Comments)   Erythromycin Itching and Rash   Morphine And Related Hives and Rash    Morphine given via IV   Follow-up [place], [facility] up.   Specialty:  Home Health  Services Why:  they  will do your home health care at your home Contact information: [place] [address] [phone]            The results of significant diagnostics from this hospitalization (including imaging, microbiology, ancillary and laboratory) are listed below for reference.    Significant Diagnostic Studies: Ct Chest Wo Contrast  Result Date: 05/07/2018 CLINICAL DATA:  Shortness of breath.  Dyspnea. EXAM: CT CHEST WITHOUT CONTRAST TECHNIQUE: Multidetector CT imaging of the chest was performed following the standard protocol without IV contrast. COMPARISON:  04/22/2018 FINDINGS: Cardiovascular: Heart size upper normal. Coronary artery calcification is evident. Atherosclerotic calcification is noted in the wall of the thoracic aorta. Mediastinum/Nodes: Stable mediastinal lymphadenopathy. 10 mm short axis precarinal node evident on 53/3. Hilar regions not well assessed given lack of intravenous contrast material. There is no axillary lymphadenopathy. The esophagus has normal imaging features. Lungs/Pleura: The central tracheobronchial airways are patent. Similar appearance right lower lobe collapse/consolidation with interval progression of posterior right middle lobe collapse. Left lower lobe collapse/consolidation seen previously is stable to minimally improved in the interval. Slight increase in moderate right pleural effusion with stable small left pleural effusion. Interlobular septal thickening and patchy ground-glass attenuation in the upper lobes is similar to prior. Upper Abdomen: Small volume fluid noted adjacent to the spleen and liver. Musculoskeletal: No worrisome lytic or sclerotic osseous abnormality. IMPRESSION: 1. No substantial interval change in exam. Some interval improvement in aeration in the left lower lobe with worsening collapse in the right middle lobe and stable collapse/consolidation in the right lower lobe. 2. Slight increase in right pleural effusion now moderate  in size with stable small left pleural effusion. 3. Interlobular septal thickening and patchy ground-glass attenuation in the upper lobes is similar to prior. 4.  Aortic Atherosclerois (ICD10-170.0) Electronically Signed   By: Misty Stanley M.D.   On: 05/07/2018 18:25   Ct Chest Wo Contrast  Result Date: 04/22/2018 CLINICAL DATA:  68 year old female with history of acute hypoxic respiratory failure. RSV pneumonia. Pulmonary edema. History of congestive heart failure. EXAM: CT CHEST WITHOUT CONTRAST TECHNIQUE: Multidetector CT imaging of the chest was performed following the standard protocol without IV contrast. COMPARISON:  Chest CT 01/17/2018. FINDINGS: Cardiovascular: Heart size is mildly enlarged. There is no significant pericardial fluid, thickening or pericardial calcification. There is aortic atherosclerosis, as well as atherosclerosis of the great vessels of the mediastinum and the coronary arteries, including calcified atherosclerotic plaque in the left anterior descending and right coronary arteries. Coronary artery stent in the proximal left anterior descending coronary artery. Mediastinum/Nodes: No pathologically enlarged mediastinal or hilar lymph nodes. Please note that accurate exclusion of hilar adenopathy is limited on noncontrast CT scans. Esophagus is unremarkable in appearance. No axillary lymphadenopathy. Lungs/Pleura: Widespread areas of ground-glass attenuation are noted throughout the lungs bilaterally, relatively symmetric and predominantly peribronchovascular in distribution. This is associated with extensive interlobular septal thickening. A few areas of more nodular ground-glass attenuation are also noted throughout the periphery of the lungs. Dependent areas of subsegmental atelectasis are noted in the lower lobes of the lungs bilaterally. Moderate bilateral pleural effusions lying dependently. Upper Abdomen: Aortic atherosclerosis.  Status post cholecystectomy. Musculoskeletal:  Chronic appearing compression fractures of T7 and T8, most severe at T8 where there is 20% loss of anterior vertebral body height. There are no aggressive appearing lytic or blastic lesions noted in the visualized portions of the skeleton. IMPRESSION: 1. Appearance of the thorax is most suggestive of  congestive heart failure, as detailed above. 2. Some of the pulmonary findings could also be associated with atypical infection, including viral pneumonia, in this patient with documented history of RSV. 3. Aortic atherosclerosis, in addition to 2 vessel coronary artery disease. Please note that although the presence of coronary artery calcium documents the presence of coronary artery disease, the severity of this disease and any potential stenosis cannot be assessed on this non-gated CT examination. Assessment for potential risk factor modification, dietary therapy or pharmacologic therapy may be warranted, if clinically indicated. Aortic Atherosclerosis (ICD10-I70.0). Electronically Signed   By: Vinnie Langton M.D.   On: 04/22/2018 09:54   Dg Chest Port 1 View  Result Date: 05/07/2018 CLINICAL DATA:  Shortness of breath EXAM: PORTABLE CHEST 1 VIEW COMPARISON:  Chest radiograph and chest CT April 22, 2018 FINDINGS: There is extensive airspace consolidation throughout the right mid and lower lung zones with right pleural effusion. Left lung is clear. Heart is upper normal in size with pulmonary vascularity normal. No adenopathy. No bone lesions. IMPRESSION: Extensive airspace consolidation throughout much of the right lung consistent with multifocal pneumonia. Superimposed right effusion. Left lung is clear. Heart upper normal in size. No evident adenopathy. Electronically Signed   By: Lowella Grip III M.D.   On: 05/07/2018 14:23   Dg Chest Port 1 View  Result Date: 04/22/2018 CLINICAL DATA:  Shortness of breath EXAM: PORTABLE CHEST 1 VIEW COMPARISON:  04/21/2018 FINDINGS: Heart is borderline enlarged.  Bilateral perihilar and lower lobe airspace opacities with diffuse interstitial prominence. Small left effusion. Findings likely reflect edema/CHF. No acute bony abnormality. No change. IMPRESSION: No significant change in CHF pattern since prior study. Electronically Signed   By: Rolm Baptise M.D.   On: 04/22/2018 07:53   [place] Soft Tissue Non Vascular  Result Date: 05/10/2018 CLINICAL DATA:  Indurated areas in left buttocks region. Evaluate for abscess. EXAM: ULTRASOUND LEFT LOWER EXTREMITY LIMITED TECHNIQUE: Ultrasound examination of the lower extremity soft tissues was performed in the area of clinical concern. COMPARISON:  None. FINDINGS: Focused ultrasound exam was performed of the medial left buttock region, assessing the 2 more focal areas of redness/induration. The more inferior of the 2 areas shows subcutaneous edema with a more focal 1.0 x 1.2 cm apparent fluid collection without a well-defined border by ultrasound. The more cranial of the 2 areas also shows underlying edema without a discrete fluid collection evident. IMPRESSION: Subcutaneous edema noted in the 2 areas of concern involving the medial left buttocks region. A small 12 mm fluid collection is identified deep to the more caudal of the 2 areas and may represent a focal area of confluent edema although superinfection of this fluid cannot be excluded by ultrasound. Electronically Signed   By: Misty Stanley M.D.   On: 05/10/2018 17:59   Vas [place] Lower Extremity Venous (dvt)  Result Date: 05/09/2018  Lower Venous Study Indications: Pitting Edema.  Risk Factors: CHF. Limitations: Pitting edema. Comparison Study: Prior study from 12/26/2017 is available for comparison. Performing Technologist: Sharion Dove RVS  Examination Guidelines: A complete evaluation includes B-mode imaging, spectral Doppler, color Doppler, and power Doppler as needed of all accessible portions of each vessel. Bilateral testing is considered an  integral part of a complete examination. Limited examinations for reoccurring indications may be performed as noted.  +---------+---------------+---------+-----------+----------+-------+  RIGHT     Compressibility Phasicity Spontaneity Properties Summary  +---------+---------------+---------+-----------+----------+-------+  CFV       Full  Yes       Yes                             +---------+---------------+---------+-----------+----------+-------+  SFJ       Full                                                      +---------+---------------+---------+-----------+----------+-------+  FV Prox   Full                                                      +---------+---------------+---------+-----------+----------+-------+  FV Mid    Full                                                      +---------+---------------+---------+-----------+----------+-------+  FV Distal Full                                                      +---------+---------------+---------+-----------+----------+-------+  PFV       Full                                                      +---------+---------------+---------+-----------+----------+-------+  POP       Full            Yes       Yes                             +---------+---------------+---------+-----------+----------+-------+  PTV       Full                                                      +---------+---------------+---------+-----------+----------+-------+  PERO      Full                                                      +---------+---------------+---------+-----------+----------+-------+   +---------+---------------+---------+-----------+----------+-------+  LEFT      Compressibility Phasicity Spontaneity Properties Summary  +---------+---------------+---------+-----------+----------+-------+  CFV       Full            Yes       Yes                             +---------+---------------+---------+-----------+----------+-------+  SFJ  Full                                                       +---------+---------------+---------+-----------+----------+-------+  FV Prox   Full                                                      +---------+---------------+---------+-----------+----------+-------+  FV Mid    Full                                                      +---------+---------------+---------+-----------+----------+-------+  FV Distal Full                                                      +---------+---------------+---------+-----------+----------+-------+  PFV       Full                                                      +---------+---------------+---------+-----------+----------+-------+  POP       Full            Yes       Yes                             +---------+---------------+---------+-----------+----------+-------+  PTV       Full                                                      +---------+---------------+---------+-----------+----------+-------+  PERO      Full                                                      +---------+---------------+---------+-----------+----------+-------+     Summary: Right: There is no evidence of deep vein thrombosis in the lower extremity. Significant interstitial fluid throughout. Left: There is no evidence of deep vein thrombosis in the lower extremity. significant interstitial fluid throughout  *See table(s) above for measurements and observations. Electronically signed by Servando Snare MD on 05/09/2018 at 4:13:34 PM.    Final     Microbiology: Recent Results (from the past 240 hour(s))  Surgical pcr screen     Status: Abnormal   Collection Time: 05/13/18  4:53 AM  Result Value Ref Range Status   MRSA, PCR POSITIVE (A) NEGATIVE Final   Staphylococcus aureus POSITIVE (A) NEGATIVE Final    Comment: CRITICAL RESULT  CALLED TO, READ BACK BY AND VERIFIED WITH: H.HAMLIN AT 0711 ON [id number] BY SJW (NOTE) The Xpert SA Assay (FDA approved for NASAL specimens in patients 23 years of age and older), is one component  of a comprehensive surveillance program. It is not intended to diagnose infection nor to guide or monitor treatment. Performed at [facility], [place] [address] [place], [place] [address]   Aerobic/Anaerobic Culture (surgical/deep wound)     Status: None   Collection Time: 05/13/18 10:27 AM  Result Value Ref Range Status   Specimen Description ABSCESS GLUTEAL  Final   Special Requests PATIENT ON FOLLOWING ANCEF  Final   Gram Stain   Final    RARE WBC PRESENT, PREDOMINANTLY PMN NO ORGANISMS SEEN    Culture   Final    FEW METHICILLIN RESISTANT STAPHYLOCOCCUS AUREUS NO ANAEROBES ISOLATED Performed at [facility], [place] [address], [place], [place] [address]    Report Status 05/18/2018 FINAL  Final   Organism ID, Bacteria METHICILLIN RESISTANT STAPHYLOCOCCUS AUREUS  Final      Susceptibility   Methicillin resistant staphylococcus aureus - MIC*    CIPROFLOXACIN >=8 RESISTANT Resistant     ERYTHROMYCIN >=8 RESISTANT Resistant     GENTAMICIN <=0.5 SENSITIVE Sensitive     OXACILLIN >=4 RESISTANT Resistant     TETRACYCLINE <=1 SENSITIVE Sensitive     VANCOMYCIN 1 SENSITIVE Sensitive     TRIMETH/SULFA <=10 SENSITIVE Sensitive     CLINDAMYCIN <=0.25 SENSITIVE Sensitive     RIFAMPIN <=0.5 SENSITIVE Sensitive     Inducible Clindamycin NEGATIVE Sensitive     * FEW METHICILLIN RESISTANT STAPHYLOCOCCUS AUREUS     Labs: Basic Metabolic Panel: Recent Labs  Lab 05/17/18 0351 05/18/18 0547 05/19/18 0427 05/20/18 1252 05/21/18 0753  NA 144 144 145 144 144  K 5.3* 5.1 5.4* 5.4* 5.3*  CL 101 103 103 100 99  CO2 33* 36* 34* 36* 35*  GLUCOSE 150* 106* 56* 104* 92  BUN 96* 104* 103* 107* 113*  CREATININE 1.86* 1.85* 1.75* 1.71* 2.07*  CALCIUM 8.3* 8.3* 8.7* 8.7* 8.6*  PHOS  --   --   --  3.9  --    Liver Function Tests: Recent Labs  Lab 05/16/18 0325 05/20/18 1252  AST 37  --   ALT 14  --   ALKPHOS 41  --   BILITOT 0.5  --   PROT 4.3*  --   ALBUMIN 1.8* 2.6*   No  results for input(s): LIPASE, AMYLASE in the last 168 hours. No results for input(s): AMMONIA in the last 168 hours. CBC: Recent Labs  Lab 05/17/18 0351 05/17/18 1538 05/18/18 0547 05/19/18 0427 05/20/18 1252  WBC 3.8* 5.5 4.6 7.0 6.5  HGB 7.1* 8.0* 6.9* 8.3* 8.3*  HCT 22.8* 25.4* 22.1* 26.2* 26.4*  MCV 92.3 92.4 93.6 91.6 92.6  PLT 98* 109* 91* 93* 88*   Cardiac Enzymes: No results for input(s): CKTOTAL, CKMB, CKMBINDEX, TROPONINI in the last 168 hours. BNP: BNP (last 3 results) Recent Labs    04/17/18 0202 04/22/18 0520 05/07/18 1555  BNP 3,208.7* 2,365.5* 1,510.5*    ProBNP (last 3 results) No results for input(s): PROBNP in the last 8760 hours.  CBG: Recent Labs  Lab 05/20/18 1137 05/20/18 1645 05/20/18 2119 05/21/18 0617 05/21/18 1154  GLUCAP 97 93 131* 105* 96       Signed:  Florencia Reasons MD, PhD  [facility] 05/21/2018, 3:10 PM

## 2018-05-21 NOTE — Progress Notes (Signed)
Occupational Therapy Treatment Patient Details Name: Andrea [name] MRN: [id number] DOB: 07-22-1950 Today's Date: 05/21/2018    History of present illness 68 yo admitted with SOB and weakness from home after leaving SNF 4/13. Pt with CHF and PNA. Pt D/C from hospital to [facility] on 4/5. PMhx: CHF, CAD, breast CA, bil mastectomy, HTn, seizure, migraine, IDDM, CKD   OT comments  Pt progressing with OT goals this session. Improved cognition from last session, but continues with decreased safety awareness, decreased comprehension of deficits, and short term memory. Pt agitated about having 4 bedrails up "I feel like I am in a cage, explained that its because she had a fall and Pt denies "I only fell on my knee". Pt agreeable and eager to work with therapy though. ON RA able to maintain >90% O2 saturation. She was mod A for boost from bed (min guard from [facility]) and able to perform washing face, oral care, and washing hands at min guard all while standing at sink level. Improved standing balance for pulling up underwear too. Pt continues to require skilled OT in the acute setting and afterwards will require 24 hour supervision/assist and HHOT (really needs SNF level therapy).   Follow Up Recommendations  SNF;Home health OT    Equipment Recommendations  None recommended by OT    Recommendations for Other Services      Precautions / Restrictions Precautions Precautions: Fall Precaution Comments: watch 02       Mobility Bed Mobility Overal bed mobility: Needs Assistance Bed Mobility: Supine to Sit     Supine to sit: Min guard     General bed mobility comments: min guard for safety, able to scoot to edge without assist today  Transfers Overall transfer level: Needs assistance Equipment used: Rolling walker (2 wheeled) Transfers: Sit to/from Stand Sit to Stand: Mod assist         General transfer comment: modA for power up into standing, min guard    Balance Overall balance  assessment: History of Falls;Needs assistance Sitting-balance support: No upper extremity supported;Feet supported Sitting balance-Leahy Scale: Fair     Standing balance support: Single extremity supported;No upper extremity supported;During functional activity Standing balance-Leahy Scale: Fair Standing balance comment: able to pull up underwear and perform standing grooming, fatigues and requires seated rest break                           ADL either performed or assessed with clinical judgement   ADL Overall ADL's : Needs assistance/impaired     Grooming: Wash/dry hands;Standing;Wash/dry face;Oral care;Min guard Grooming Details (indicate cue type and reason): required cues to actually initiate and perform task ("wipe your mouth")          Upper Body Dressing : Set up Upper Body Dressing Details (indicate cue type and reason): to don gown like robe Lower Body Dressing: Min guard;Bed level Lower Body Dressing Details (indicate cue type and reason): to don socks Toilet Transfer: BSC;Moderate assistance;Ambulation;RW Toilet Transfer Details (indicate cue type and reason): mod A for boost, then min guard for ambulation with RW Toileting- Clothing Manipulation and Hygiene: Sit to/from stand;Min guard Toileting - Clothing Manipulation Details (indicate cue type and reason): Pt required cues to wipe the front and the back of the perianal area     Functional mobility during ADLs: Minimal assistance;Rolling walker;Cueing for safety General ADL Comments: Pt encouraged to walk to bathroom WITH staff assist     Vision  Perception     Praxis      Cognition Arousal/Alertness: Awake/alert Behavior During Therapy: Flat affect Overall Cognitive Status: Impaired/Different from baseline Area of Impairment: Attention;Following commands;Memory;Safety/judgement;Awareness;Problem solving                   Current Attention Level: Sustained Memory: Decreased  short-term memory Following Commands: Follows one step commands with increased time Safety/Judgement: Decreased awareness of safety;Decreased awareness of deficits Awareness: Emergent Problem Solving: Slow processing General Comments: Pt hyper focused on "I did not fall" because she feels penned in with all 4 bedrails up. She demonstrated great initiation during sink level ADL and did not require physical assist or cues. However required cues for peri care.        Exercises     Shoulder Instructions       General Comments Pt on RA for sink level grooming and bathrooming, O2 was at 91%. Put back on O2 for hallway ambulation with PT    Pertinent Vitals/ Pain       Pain Assessment: Faces Faces Pain Scale: Hurts even more Pain Location: Buttocks, site of I&D Pain Descriptors / Indicators: Sore;Aching;Discomfort;Grimacing;Guarding Pain Intervention(s): Monitored during session;Repositioned  Home Living                                          Prior Functioning/Environment              Frequency  Min 3X/week        Progress Toward Goals  OT Goals(current goals can now be found in the care plan section)  Progress towards OT goals: Progressing toward goals  Acute Rehab OT Goals Patient Stated Goal: get stronger and go home OT Goal Formulation: With patient Time For Goal Achievement: 05/22/18 Potential to Achieve Goals: Good  Plan Discharge plan remains appropriate;Frequency remains appropriate    Co-evaluation                 AM-PAC OT "6 Clicks" Daily Activity     Outcome Measure   Help from another person eating meals?: None Help from another person taking care of personal grooming?: A Little Help from another person toileting, which includes using toliet, bedpan, or urinal?: A Little Help from another person bathing (including washing, rinsing, drying)?: A Little Help from another person to put on and taking off regular upper body  clothing?: None Help from another person to put on and taking off regular lower body clothing?: A Little 6 Click Score: 20    End of Session Equipment Utilized During Treatment: Gait belt;Rolling walker  OT Visit Diagnosis: Muscle weakness (generalized) (M62.81);Adult, failure to thrive (R62.7)   Activity Tolerance Patient tolerated treatment well   Patient Left Other (comment)(walking in hallway with PT)   Nurse Communication Mobility status        Time: 7341-9379 OT Time Calculation (min): 26 min  Charges: OT General Charges $OT Visit: 1 Visit OT Treatments $Self Care/Home Management : 23-37 mins  Hulda Humphrey OTR/L Acute Rehabilitation Services Pager: [phone] Office: [place] 05/21/2018, 10:40 AM

## 2018-05-21 NOTE — Progress Notes (Addendum)
PROGRESS NOTE  Andrea [name] MVH:[id number] DOB: July 23, 1950 DOA: 05/07/2018 PCP: Medicine, [facility]   PCP: Medicine, [facility]             Brief Narrative:  68 year old female past medical history of diastolic CHF, three-vessel CAD status post PCI, diabetes mellitus and stage III-4 CKD who presented to the emergency room on 4/16 with complaints of shortness of breath as well as weakness, unable to pick herself up off the floor. She had been just discharged 10 days PTA for a multi lobar pneumonia from RSV plus CHF exacerbation. She had just left her skilled nursing facility after discharge 3 days before coming in to the back to the emergency room. COVID ruled out. Patient found to be in significant volume overload with 30 pound weight gain. Also complained of right buttock pain where she is found to have an area of induration with fluctuance.  Patient was admitted, being diuresed, creatinine fluctuating, still appears fluid overloaded.  On diuretics.  Underwent debridement of left buttock abscess 4/22. Ongoing aki and fluid overload- nephro consulted 4/25 4/28- left buttock drain removed   HPI/Recap of past 24 hours:  1.8liter urine output last 24hrs, cr 2.07, bp stable no cough, denies chest pain, edema is improving  She is on room air at rest ( report on nightly o2  And prn o2 during the day at baseline ) She is getting sitz bath and dressing to Left buttock  Assessment/Plan: Principal Problem:   Acute on chronic diastolic congestive heart failure (HCC) Active Problems:   Essential hypertension   Insulin dependent diabetes mellitus (HCC)   Hypothyroidism   History of seizures   Chronic pain   CAD S/P percutaneous coronary angioplasty   Seizure (HCC)   Anemia   GAD (generalized anxiety disorder)   Hyperkalemia   CKD (chronic kidney disease), stage III (HCC)   Bilateral leg edema   FTT (failure to thrive) in adult   Pressure  injury of skin   Cellulitis and abscess of buttock  Acute on chronic diastolic CHF: -Patient with 30lbs weight gain , significant volume overload on admission with bilateral lower extremity 3+ pitting edema up to thigh/flank,  -venous doppler negative for DVT -she has slightly increased right pleural effusion from prior although BNP is improved from last admission. -she is started on IV Lasix 40 mg twice daily on admission, lasix dose increased to 80mg  bid on 4/18, then iv lasix 80mg  tid, lasix changed to 80mg  daily from 5/1  -Continue home carvedilol/hydralazine/imdur -Strict I/O's, daily weights -nephrology input appreciated  Bilateral lung infiltrate, viral pneumonia vs from chf -RSV + on this admission, interestingly she was tested + for RSV on 3/21 during last hospitalization as well, she might have ongoing RSV shedding for the last three weeks -COVID19 testing on 4/19 is negative -procalcitonin 0.11 -She is afebrile, no leukocytosis, no lymphopenia - congested cough, rhonchi initially, now lungs are clear, - cotinue Mucinex, nebs, supportive care, continue diuresis  CADs/p PCI of proximal and distal LAD w/ DES x 2 (12/30/2017): Chronic and stable without typical chest pain.  Patient not on aspirin due to prior allergy. She was previously on Brilinta after DES placement however this was switched to Plavix on recent admission. It was recommended she continue Plavix due to risk for stent thrombosis without antiplatelet therapy  Insulin-dependent diabetes: A1c 6.4 on 03/15/2018. -Continue Lantus plus sensitive SSI, adjust prn  AKI on CKDIII Creatinine and GFR relatively stable compared to  recent baseline however slowly worsening over the last 6 months. Likely multifactorial from diabetes, CHF, and hypertension. -cr on presentation was 2.37, cr today is 1.7 -Continue to monitor with diuresis -renal dosing meds, avoid renal toxin -nephrology input appreciated   Hyperkalemia: K 5.6 on admission.No acute EKG changes. -improved  with Lasix diuresis, on increased lasix  -per nephrology "If K > 5.5 would use Veltassa 8.4gm to 16.8gm "  Acute on chronic anemia hgb 7.8 on presentation, no sign of blood loss, possible component of hemodilution in the setting of significant volume overload History of colonoscopy 5 years ago which was normal per patient report, no history of melena or hematochezia She received  prbcx4 units since admission,  Anemia workup, FOBT negative  Thrombocytopenia plt 88 today Monitor She received platelet transfusion on 4/24 for unclear reason, possibly due to post op bleeding  Hypothyroidism: TSH 1.937 on 03/18/2018. -Continue Synthroid  Seizure disorder: Currently stable. -Continue Depakote  Generalized anxiety disorder: Chronic and stable.  -Continue BuSpar -klonopin 0.25mg  prn, she has not required any since hospitalization, do not plan to prescribe benzo at discharge.  Chronic back pain/arthritis with chronic left knee pain: -Has been taking long-acting morphine and short-acting narcotics. Will continue both for now with hold parameters, may be contributing to her generalized weakness/deconditioning. -Continue morphine 15 mg p.o. 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 Donnie Mesa   -penrose removed as it will not stay in the wound.  Start NS WD dressing changes to buttock wounds BID.  Continue sitz bathes. -she is cleared by surgery to discharge to SNF -she is to follow up with general surgery  Left labial redness :appears to be draining, continue heart compression  Body mass index is 32.24 kg/m.   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, she reports started to use a walker to get around for a year She initially refused to go back to SNF, now agrees to SNF or LTAC Appreciate social Development worker, community assistance    Code Status: full  Family Communication: patient   Disposition Plan:  LTAC vs snf   Consultants:  Nephrology  General surgery  Procedures:  Wound debridement   Antibiotics:  As above   Objective: BP 139/67   Pulse 68   Temp (!) 97.2 F (36.2 C) (Oral)   Resp 18   Ht 5\' 4"  (1.626 m)   Wt 85.2 kg   SpO2 99%   BMI 32.24 kg/m   Intake/Output Summary (Last 24 hours) at 05/21/2018 0748 Last data filed at 05/21/2018 0640 Gross per 24 hour  Intake 1713 ml  Output 1805 ml  Net -92 ml   Filed Weights   05/19/18 0325 05/20/18 0313 05/21/18 0516  Weight: 84.4 kg 86.7 kg 85.2 kg    Exam: Patient is  examined daily including today on 05/21/2018, exams remain the same as of yesterday except that has changed    General:  NAD,chronically ill appearing  Cardiovascular: RRR  Respiratory: CTABL  Abdomen: Soft/ND/NT, positive BS  Musculoskeletal:bilateral lower extremity pitting Edema  Neuro: alert, oriented   Data Reviewed: Basic Metabolic Panel: Recent Labs  Lab 05/16/18 0325 05/17/18 0351 05/18/18 0547 05/19/18 0427 05/20/18 1252  NA 144 144 144 145 144  K 5.5* 5.3* 5.1 5.4* 5.4*  CL 104 101 103 103 100  CO2 33* 33* 36* 34* 36*  GLUCOSE 133* 150* 106* 56* 104*  BUN 95* 96* 104* 103* 107*  CREATININE 2.14* 1.86* 1.85* 1.75* 1.71*  CALCIUM 8.0* 8.3* 8.3* 8.7* 8.7*  PHOS   --   --   --   --  3.9   Liver Function Tests: Recent Labs  Lab 05/16/18 0325 05/20/18 1252  AST 37  --   ALT 14  --   ALKPHOS 41  --   BILITOT 0.5  --   PROT 4.3*  --   ALBUMIN 1.8* 2.6*   No results for input(s): LIPASE, AMYLASE in the last 168 hours. No results for input(s): AMMONIA in the last 168 hours. CBC: Recent Labs  Lab 05/17/18 0351 05/17/18 1538 05/18/18 0547 05/19/18 0427 05/20/18 1252  WBC 3.8* 5.5 4.6 7.0 6.5  HGB 7.1* 8.0* 6.9* 8.3* 8.3*  HCT 22.8* 25.4* 22.1* 26.2* 26.4*  MCV 92.3 92.4 93.6 91.6 92.6  PLT 98* 109* 91* 93* 88*   Cardiac Enzymes:   No results for input(s): CKTOTAL, CKMB, CKMBINDEX, TROPONINI in the last 168 hours. BNP (last 3 results) Recent Labs    04/17/18 0202 04/22/18 0520 05/07/18 1555  BNP 3,208.7* 2,365.5* 1,510.5*    ProBNP (last 3 results) No results for input(s): PROBNP in the last 8760 hours.  CBG: Recent Labs  Lab 05/20/18 0645 05/20/18 1137 05/20/18 1645 05/20/18 2119 05/21/18 0617  GLUCAP 99 97 93 131* 105*    Recent Results (from the past 240 hour(s))  Surgical pcr screen     Status: Abnormal   Collection Time: 05/13/18  4:53 AM  Result Value Ref Range Status   MRSA, PCR POSITIVE (A) NEGATIVE Final   Staphylococcus aureus POSITIVE (A) NEGATIVE Final    Comment: CRITICAL RESULT CALLED TO, READ BACK BY AND VERIFIED WITH: H.HAMLIN AT 0711 ON [id number] BY SJW (NOTE) The Xpert SA Assay (FDA approved for NASAL specimens in patients 50 years of age and older), is one component of a comprehensive surveillance program. It is not intended to diagnose infection nor to guide or monitor treatment. Performed at [facility], [place] [address], [place], [place] [address]   Aerobic/Anaerobic Culture (surgical/deep wound)     Status: None   Collection Time: 05/13/18 10:27 AM  Result Value Ref Range Status   Specimen Description ABSCESS GLUTEAL  Final   Special Requests PATIENT ON FOLLOWING ANCEF  Final   Gram  Stain   Final    RARE WBC PRESENT, PREDOMINANTLY PMN NO ORGANISMS SEEN    Culture   Final    FEW METHICILLIN RESISTANT STAPHYLOCOCCUS AUREUS NO ANAEROBES ISOLATED Performed at [facility], [place] [address] [place], [place] [address]    Report Status 05/18/2018 FINAL  Final   Organism ID, Bacteria METHICILLIN RESISTANT STAPHYLOCOCCUS AUREUS  Final      Susceptibility   Methicillin resistant staphylococcus aureus - MIC*    CIPROFLOXACIN >=8 RESISTANT Resistant     ERYTHROMYCIN >=  8 RESISTANT Resistant     GENTAMICIN <=0.5 SENSITIVE Sensitive     OXACILLIN >=4 RESISTANT Resistant     TETRACYCLINE <=1 SENSITIVE Sensitive     VANCOMYCIN 1 SENSITIVE Sensitive     TRIMETH/SULFA <=10 SENSITIVE Sensitive     CLINDAMYCIN <=0.25 SENSITIVE Sensitive     RIFAMPIN <=0.5 SENSITIVE Sensitive     Inducible Clindamycin NEGATIVE Sensitive     * FEW METHICILLIN RESISTANT STAPHYLOCOCCUS AUREUS     Studies: No results found.  Scheduled Meds: . arformoterol  15 mcg Nebulization BID  . atorvastatin  80 mg Oral QHS  . bisacodyl  10 mg Rectal Once  . budesonide  0.5 mg Nebulization BID  . busPIRone  15 mg Oral BID  . carvedilol  25 mg Oral BID WC  . Chlorhexidine Gluconate Cloth  6 each Topical Q0600  . Chlorhexidine Gluconate Cloth  6 each Topical Once  . clopidogrel  75 mg Oral Daily  . divalproex  1,000 mg Oral Daily  . doxycycline  100 mg Oral Q12H  . feeding supplement (GLUCERNA SHAKE)  237 mL Oral QHS  . folic acid  1 mg Oral Daily  . furosemide  80 mg Intravenous Once  . furosemide  80 mg Intravenous Q8H  . hydrALAZINE  50 mg Oral Q8H  . insulin aspart  0-9 Units Subcutaneous TID WC  . insulin glargine  4 Units Subcutaneous QHS  . isosorbide mononitrate  30 mg Oral Daily  . levothyroxine  200 mcg Oral QAC breakfast  . mouth rinse  15 mL Mouth Rinse BID  . Melatonin  3 mg Oral QHS  . morphine  15 mg Oral BID  . multivitamin with minerals  1 tablet Oral Q1200  . nutrition  supplement (JUVEN)  1 packet Oral BID BM  . pantoprazole  40 mg Oral Daily  . polyethylene glycol  17 g Oral Daily  . pregabalin  75 mg Oral BID  . senna-docusate  2 tablet Oral BID  . sodium chloride flush  3 mL Intravenous Q12H    Continuous Infusions: . sodium chloride Stopped (05/08/18 0231)     Time spent: 45mins I have personally reviewed and interpreted on  05/21/2018 daily labs, tele strips, imagings as discussed above under date review session and assessment and plans.  I reviewed all nursing notes, pharmacy notes, consultant notes,  vitals, pertinent old records  I have discussed plan of care as described above with RN , patient  on 05/21/2018   Florencia Reasons MD, PhD  [facility] Pager [phone]. If 7PM-7AM, please contact night-coverage at [email or web address], password [facility] 05/21/2018, 7:48 AM  LOS: 14 days

## 2018-05-25 DIAGNOSIS — I5032 Chronic diastolic (congestive) heart failure: Secondary | ICD-10-CM

## 2018-05-25 DIAGNOSIS — I959 Hypotension, unspecified: Secondary | ICD-10-CM

## 2018-05-25 DIAGNOSIS — J9621 Acute and chronic respiratory failure with hypoxia: Secondary | ICD-10-CM

## 2018-05-25 DIAGNOSIS — N179 Acute kidney failure, unspecified: Secondary | ICD-10-CM

## 2018-05-25 DIAGNOSIS — J189 Pneumonia, unspecified organism: Secondary | ICD-10-CM

## 2018-05-26 DIAGNOSIS — N179 Acute kidney failure, unspecified: Secondary | ICD-10-CM

## 2018-05-26 DIAGNOSIS — J189 Pneumonia, unspecified organism: Secondary | ICD-10-CM

## 2018-05-26 DIAGNOSIS — I5032 Chronic diastolic (congestive) heart failure: Secondary | ICD-10-CM

## 2018-05-26 DIAGNOSIS — I959 Hypotension, unspecified: Secondary | ICD-10-CM

## 2018-05-26 DIAGNOSIS — J9621 Acute and chronic respiratory failure with hypoxia: Secondary | ICD-10-CM

## 2018-05-27 DIAGNOSIS — J9621 Acute and chronic respiratory failure with hypoxia: Secondary | ICD-10-CM

## 2018-05-27 DIAGNOSIS — N179 Acute kidney failure, unspecified: Secondary | ICD-10-CM

## 2018-05-27 DIAGNOSIS — I5032 Chronic diastolic (congestive) heart failure: Secondary | ICD-10-CM

## 2018-05-27 DIAGNOSIS — I959 Hypotension, unspecified: Secondary | ICD-10-CM

## 2018-05-27 DIAGNOSIS — J189 Pneumonia, unspecified organism: Secondary | ICD-10-CM

## 2018-05-28 DIAGNOSIS — J189 Pneumonia, unspecified organism: Secondary | ICD-10-CM

## 2018-05-28 DIAGNOSIS — J9621 Acute and chronic respiratory failure with hypoxia: Secondary | ICD-10-CM

## 2018-05-28 DIAGNOSIS — I959 Hypotension, unspecified: Secondary | ICD-10-CM

## 2018-05-28 DIAGNOSIS — I5032 Chronic diastolic (congestive) heart failure: Secondary | ICD-10-CM

## 2018-05-28 DIAGNOSIS — N179 Acute kidney failure, unspecified: Secondary | ICD-10-CM

## 2018-05-29 DIAGNOSIS — I5032 Chronic diastolic (congestive) heart failure: Secondary | ICD-10-CM

## 2018-05-29 DIAGNOSIS — J189 Pneumonia, unspecified organism: Secondary | ICD-10-CM

## 2018-05-29 DIAGNOSIS — J9621 Acute and chronic respiratory failure with hypoxia: Secondary | ICD-10-CM

## 2018-05-29 DIAGNOSIS — I959 Hypotension, unspecified: Secondary | ICD-10-CM

## 2018-05-29 DIAGNOSIS — N179 Acute kidney failure, unspecified: Secondary | ICD-10-CM

## 2018-05-30 DIAGNOSIS — J9621 Acute and chronic respiratory failure with hypoxia: Secondary | ICD-10-CM

## 2018-05-30 DIAGNOSIS — J189 Pneumonia, unspecified organism: Secondary | ICD-10-CM

## 2018-05-30 DIAGNOSIS — N179 Acute kidney failure, unspecified: Secondary | ICD-10-CM

## 2018-05-30 DIAGNOSIS — I5032 Chronic diastolic (congestive) heart failure: Secondary | ICD-10-CM

## 2018-05-30 DIAGNOSIS — I959 Hypotension, unspecified: Secondary | ICD-10-CM

## 2018-05-31 DIAGNOSIS — N179 Acute kidney failure, unspecified: Secondary | ICD-10-CM

## 2018-05-31 DIAGNOSIS — I959 Hypotension, unspecified: Secondary | ICD-10-CM

## 2018-05-31 DIAGNOSIS — J9621 Acute and chronic respiratory failure with hypoxia: Secondary | ICD-10-CM

## 2018-05-31 DIAGNOSIS — J189 Pneumonia, unspecified organism: Secondary | ICD-10-CM

## 2018-05-31 DIAGNOSIS — I5032 Chronic diastolic (congestive) heart failure: Secondary | ICD-10-CM

## 2018-06-08 DIAGNOSIS — J9621 Acute and chronic respiratory failure with hypoxia: Secondary | ICD-10-CM

## 2018-06-08 DIAGNOSIS — N179 Acute kidney failure, unspecified: Secondary | ICD-10-CM

## 2018-06-08 DIAGNOSIS — J189 Pneumonia, unspecified organism: Secondary | ICD-10-CM

## 2018-06-08 DIAGNOSIS — I5032 Chronic diastolic (congestive) heart failure: Secondary | ICD-10-CM

## 2018-06-08 DIAGNOSIS — I959 Hypotension, unspecified: Secondary | ICD-10-CM

## 2018-06-09 DIAGNOSIS — I959 Hypotension, unspecified: Secondary | ICD-10-CM

## 2018-06-09 DIAGNOSIS — I5032 Chronic diastolic (congestive) heart failure: Secondary | ICD-10-CM

## 2018-06-09 DIAGNOSIS — J189 Pneumonia, unspecified organism: Secondary | ICD-10-CM

## 2018-06-09 DIAGNOSIS — J9621 Acute and chronic respiratory failure with hypoxia: Secondary | ICD-10-CM

## 2018-06-09 DIAGNOSIS — N179 Acute kidney failure, unspecified: Secondary | ICD-10-CM

## 2018-06-10 DIAGNOSIS — I959 Hypotension, unspecified: Secondary | ICD-10-CM

## 2018-06-10 DIAGNOSIS — I5032 Chronic diastolic (congestive) heart failure: Secondary | ICD-10-CM

## 2018-06-10 DIAGNOSIS — J189 Pneumonia, unspecified organism: Secondary | ICD-10-CM

## 2018-06-10 DIAGNOSIS — J9621 Acute and chronic respiratory failure with hypoxia: Secondary | ICD-10-CM

## 2018-06-10 DIAGNOSIS — N179 Acute kidney failure, unspecified: Secondary | ICD-10-CM

## 2018-06-11 DIAGNOSIS — I5032 Chronic diastolic (congestive) heart failure: Secondary | ICD-10-CM

## 2018-06-11 DIAGNOSIS — J189 Pneumonia, unspecified organism: Secondary | ICD-10-CM

## 2018-06-11 DIAGNOSIS — J9621 Acute and chronic respiratory failure with hypoxia: Secondary | ICD-10-CM

## 2018-06-11 DIAGNOSIS — N179 Acute kidney failure, unspecified: Secondary | ICD-10-CM

## 2018-06-11 DIAGNOSIS — I959 Hypotension, unspecified: Secondary | ICD-10-CM

## 2018-06-12 DIAGNOSIS — I5032 Chronic diastolic (congestive) heart failure: Secondary | ICD-10-CM

## 2018-06-12 DIAGNOSIS — N179 Acute kidney failure, unspecified: Secondary | ICD-10-CM

## 2018-06-12 DIAGNOSIS — J189 Pneumonia, unspecified organism: Secondary | ICD-10-CM

## 2018-06-12 DIAGNOSIS — J9621 Acute and chronic respiratory failure with hypoxia: Secondary | ICD-10-CM

## 2018-06-12 DIAGNOSIS — I959 Hypotension, unspecified: Secondary | ICD-10-CM

## 2018-06-13 DIAGNOSIS — J9621 Acute and chronic respiratory failure with hypoxia: Secondary | ICD-10-CM

## 2018-06-13 DIAGNOSIS — J189 Pneumonia, unspecified organism: Secondary | ICD-10-CM

## 2018-06-13 DIAGNOSIS — N179 Acute kidney failure, unspecified: Secondary | ICD-10-CM

## 2018-06-13 DIAGNOSIS — I959 Hypotension, unspecified: Secondary | ICD-10-CM

## 2018-06-13 DIAGNOSIS — I5032 Chronic diastolic (congestive) heart failure: Secondary | ICD-10-CM

## 2018-06-14 DIAGNOSIS — N179 Acute kidney failure, unspecified: Secondary | ICD-10-CM

## 2018-06-14 DIAGNOSIS — I5032 Chronic diastolic (congestive) heart failure: Secondary | ICD-10-CM

## 2018-06-14 DIAGNOSIS — J9621 Acute and chronic respiratory failure with hypoxia: Secondary | ICD-10-CM

## 2018-06-14 DIAGNOSIS — J189 Pneumonia, unspecified organism: Secondary | ICD-10-CM

## 2018-06-14 DIAGNOSIS — I959 Hypotension, unspecified: Secondary | ICD-10-CM

## 2018-06-22 DIAGNOSIS — I959 Hypotension, unspecified: Secondary | ICD-10-CM

## 2018-06-22 DIAGNOSIS — J189 Pneumonia, unspecified organism: Secondary | ICD-10-CM

## 2018-06-22 DIAGNOSIS — I5032 Chronic diastolic (congestive) heart failure: Secondary | ICD-10-CM

## 2018-06-22 DIAGNOSIS — J9621 Acute and chronic respiratory failure with hypoxia: Secondary | ICD-10-CM

## 2018-06-22 DIAGNOSIS — N179 Acute kidney failure, unspecified: Secondary | ICD-10-CM

## 2018-06-23 DIAGNOSIS — J9621 Acute and chronic respiratory failure with hypoxia: Secondary | ICD-10-CM

## 2018-06-23 DIAGNOSIS — J189 Pneumonia, unspecified organism: Secondary | ICD-10-CM

## 2018-06-23 DIAGNOSIS — N179 Acute kidney failure, unspecified: Secondary | ICD-10-CM

## 2018-06-23 DIAGNOSIS — I5032 Chronic diastolic (congestive) heart failure: Secondary | ICD-10-CM

## 2018-06-23 DIAGNOSIS — I959 Hypotension, unspecified: Secondary | ICD-10-CM

## 2018-06-24 DIAGNOSIS — N179 Acute kidney failure, unspecified: Secondary | ICD-10-CM

## 2018-06-24 DIAGNOSIS — J9621 Acute and chronic respiratory failure with hypoxia: Secondary | ICD-10-CM

## 2018-06-24 DIAGNOSIS — I5032 Chronic diastolic (congestive) heart failure: Secondary | ICD-10-CM

## 2018-06-24 DIAGNOSIS — I959 Hypotension, unspecified: Secondary | ICD-10-CM

## 2018-06-24 DIAGNOSIS — J189 Pneumonia, unspecified organism: Secondary | ICD-10-CM

## 2018-06-25 DIAGNOSIS — J189 Pneumonia, unspecified organism: Secondary | ICD-10-CM

## 2018-06-25 DIAGNOSIS — I5032 Chronic diastolic (congestive) heart failure: Secondary | ICD-10-CM

## 2018-06-25 DIAGNOSIS — J9621 Acute and chronic respiratory failure with hypoxia: Secondary | ICD-10-CM

## 2018-06-25 DIAGNOSIS — I959 Hypotension, unspecified: Secondary | ICD-10-CM

## 2018-06-25 DIAGNOSIS — N179 Acute kidney failure, unspecified: Secondary | ICD-10-CM

## 2018-06-26 DIAGNOSIS — J9621 Acute and chronic respiratory failure with hypoxia: Secondary | ICD-10-CM

## 2018-06-26 DIAGNOSIS — J189 Pneumonia, unspecified organism: Secondary | ICD-10-CM

## 2018-06-26 DIAGNOSIS — I959 Hypotension, unspecified: Secondary | ICD-10-CM

## 2018-06-26 DIAGNOSIS — I5032 Chronic diastolic (congestive) heart failure: Secondary | ICD-10-CM

## 2018-06-26 DIAGNOSIS — N179 Acute kidney failure, unspecified: Secondary | ICD-10-CM

## 2018-06-27 DIAGNOSIS — J189 Pneumonia, unspecified organism: Secondary | ICD-10-CM

## 2018-06-27 DIAGNOSIS — N179 Acute kidney failure, unspecified: Secondary | ICD-10-CM

## 2018-06-27 DIAGNOSIS — I5032 Chronic diastolic (congestive) heart failure: Secondary | ICD-10-CM

## 2018-06-27 DIAGNOSIS — J9621 Acute and chronic respiratory failure with hypoxia: Secondary | ICD-10-CM

## 2018-06-27 DIAGNOSIS — I959 Hypotension, unspecified: Secondary | ICD-10-CM

## 2018-06-28 DIAGNOSIS — I959 Hypotension, unspecified: Secondary | ICD-10-CM

## 2018-06-28 DIAGNOSIS — I5032 Chronic diastolic (congestive) heart failure: Secondary | ICD-10-CM

## 2018-06-28 DIAGNOSIS — J189 Pneumonia, unspecified organism: Secondary | ICD-10-CM

## 2018-06-28 DIAGNOSIS — J9621 Acute and chronic respiratory failure with hypoxia: Secondary | ICD-10-CM

## 2018-06-28 DIAGNOSIS — N179 Acute kidney failure, unspecified: Secondary | ICD-10-CM

## 2018-07-06 DIAGNOSIS — I959 Hypotension, unspecified: Secondary | ICD-10-CM

## 2018-07-06 DIAGNOSIS — J9621 Acute and chronic respiratory failure with hypoxia: Secondary | ICD-10-CM

## 2018-07-06 DIAGNOSIS — I5032 Chronic diastolic (congestive) heart failure: Secondary | ICD-10-CM

## 2018-07-06 DIAGNOSIS — J189 Pneumonia, unspecified organism: Secondary | ICD-10-CM

## 2018-07-06 DIAGNOSIS — N179 Acute kidney failure, unspecified: Secondary | ICD-10-CM

## 2018-07-07 DIAGNOSIS — J189 Pneumonia, unspecified organism: Secondary | ICD-10-CM

## 2018-07-07 DIAGNOSIS — I959 Hypotension, unspecified: Secondary | ICD-10-CM

## 2018-07-07 DIAGNOSIS — I5032 Chronic diastolic (congestive) heart failure: Secondary | ICD-10-CM

## 2018-07-07 DIAGNOSIS — J9621 Acute and chronic respiratory failure with hypoxia: Secondary | ICD-10-CM

## 2018-07-07 DIAGNOSIS — N179 Acute kidney failure, unspecified: Secondary | ICD-10-CM

## 2018-07-08 DIAGNOSIS — J189 Pneumonia, unspecified organism: Secondary | ICD-10-CM

## 2018-07-08 DIAGNOSIS — N179 Acute kidney failure, unspecified: Secondary | ICD-10-CM

## 2018-07-08 DIAGNOSIS — I959 Hypotension, unspecified: Secondary | ICD-10-CM

## 2018-07-08 DIAGNOSIS — J9621 Acute and chronic respiratory failure with hypoxia: Secondary | ICD-10-CM

## 2018-07-08 DIAGNOSIS — I5032 Chronic diastolic (congestive) heart failure: Secondary | ICD-10-CM

## 2018-07-09 DIAGNOSIS — J9621 Acute and chronic respiratory failure with hypoxia: Secondary | ICD-10-CM

## 2018-07-09 DIAGNOSIS — J189 Pneumonia, unspecified organism: Secondary | ICD-10-CM

## 2018-07-09 DIAGNOSIS — N179 Acute kidney failure, unspecified: Secondary | ICD-10-CM

## 2018-07-09 DIAGNOSIS — I5032 Chronic diastolic (congestive) heart failure: Secondary | ICD-10-CM

## 2018-07-09 DIAGNOSIS — I959 Hypotension, unspecified: Secondary | ICD-10-CM

## 2018-07-10 DIAGNOSIS — N179 Acute kidney failure, unspecified: Secondary | ICD-10-CM

## 2018-07-10 DIAGNOSIS — J9621 Acute and chronic respiratory failure with hypoxia: Secondary | ICD-10-CM

## 2018-07-10 DIAGNOSIS — I5032 Chronic diastolic (congestive) heart failure: Secondary | ICD-10-CM

## 2018-07-10 DIAGNOSIS — I959 Hypotension, unspecified: Secondary | ICD-10-CM

## 2018-07-10 DIAGNOSIS — J189 Pneumonia, unspecified organism: Secondary | ICD-10-CM

## 2018-07-11 DIAGNOSIS — I5032 Chronic diastolic (congestive) heart failure: Secondary | ICD-10-CM

## 2018-07-11 DIAGNOSIS — I959 Hypotension, unspecified: Secondary | ICD-10-CM

## 2018-07-11 DIAGNOSIS — N179 Acute kidney failure, unspecified: Secondary | ICD-10-CM

## 2018-07-11 DIAGNOSIS — J9621 Acute and chronic respiratory failure with hypoxia: Secondary | ICD-10-CM

## 2018-07-11 DIAGNOSIS — J189 Pneumonia, unspecified organism: Secondary | ICD-10-CM

## 2018-07-12 DIAGNOSIS — I5032 Chronic diastolic (congestive) heart failure: Secondary | ICD-10-CM

## 2018-07-12 DIAGNOSIS — N179 Acute kidney failure, unspecified: Secondary | ICD-10-CM

## 2018-07-12 DIAGNOSIS — I959 Hypotension, unspecified: Secondary | ICD-10-CM

## 2018-07-12 DIAGNOSIS — J9621 Acute and chronic respiratory failure with hypoxia: Secondary | ICD-10-CM

## 2018-07-12 DIAGNOSIS — J189 Pneumonia, unspecified organism: Secondary | ICD-10-CM

## 2018-07-20 DIAGNOSIS — J9621 Acute and chronic respiratory failure with hypoxia: Secondary | ICD-10-CM

## 2018-07-20 DIAGNOSIS — N179 Acute kidney failure, unspecified: Secondary | ICD-10-CM

## 2018-07-20 DIAGNOSIS — J189 Pneumonia, unspecified organism: Secondary | ICD-10-CM

## 2018-07-20 DIAGNOSIS — I959 Hypotension, unspecified: Secondary | ICD-10-CM

## 2018-07-20 DIAGNOSIS — I5032 Chronic diastolic (congestive) heart failure: Secondary | ICD-10-CM

## 2018-07-21 DIAGNOSIS — I959 Hypotension, unspecified: Secondary | ICD-10-CM

## 2018-07-21 DIAGNOSIS — J189 Pneumonia, unspecified organism: Secondary | ICD-10-CM

## 2018-07-21 DIAGNOSIS — N179 Acute kidney failure, unspecified: Secondary | ICD-10-CM

## 2018-07-21 DIAGNOSIS — J9621 Acute and chronic respiratory failure with hypoxia: Secondary | ICD-10-CM

## 2018-07-21 DIAGNOSIS — I5032 Chronic diastolic (congestive) heart failure: Secondary | ICD-10-CM

## 2018-07-22 DIAGNOSIS — J189 Pneumonia, unspecified organism: Secondary | ICD-10-CM

## 2018-07-22 DIAGNOSIS — I5032 Chronic diastolic (congestive) heart failure: Secondary | ICD-10-CM

## 2018-07-22 DIAGNOSIS — I959 Hypotension, unspecified: Secondary | ICD-10-CM

## 2018-07-22 DIAGNOSIS — J9621 Acute and chronic respiratory failure with hypoxia: Secondary | ICD-10-CM

## 2018-07-22 DIAGNOSIS — N179 Acute kidney failure, unspecified: Secondary | ICD-10-CM

## 2018-07-23 DIAGNOSIS — J9621 Acute and chronic respiratory failure with hypoxia: Secondary | ICD-10-CM

## 2018-07-23 DIAGNOSIS — I959 Hypotension, unspecified: Secondary | ICD-10-CM

## 2018-07-23 DIAGNOSIS — I5032 Chronic diastolic (congestive) heart failure: Secondary | ICD-10-CM

## 2018-07-23 DIAGNOSIS — N179 Acute kidney failure, unspecified: Secondary | ICD-10-CM

## 2018-07-23 DIAGNOSIS — J189 Pneumonia, unspecified organism: Secondary | ICD-10-CM

## 2018-07-24 DIAGNOSIS — J189 Pneumonia, unspecified organism: Secondary | ICD-10-CM

## 2018-07-24 DIAGNOSIS — N179 Acute kidney failure, unspecified: Secondary | ICD-10-CM

## 2018-07-24 DIAGNOSIS — J9621 Acute and chronic respiratory failure with hypoxia: Secondary | ICD-10-CM

## 2018-07-24 DIAGNOSIS — I959 Hypotension, unspecified: Secondary | ICD-10-CM

## 2018-07-24 DIAGNOSIS — I5032 Chronic diastolic (congestive) heart failure: Secondary | ICD-10-CM

## 2018-07-25 DIAGNOSIS — J9621 Acute and chronic respiratory failure with hypoxia: Secondary | ICD-10-CM

## 2018-07-25 DIAGNOSIS — J189 Pneumonia, unspecified organism: Secondary | ICD-10-CM

## 2018-07-25 DIAGNOSIS — I959 Hypotension, unspecified: Secondary | ICD-10-CM

## 2018-07-25 DIAGNOSIS — N179 Acute kidney failure, unspecified: Secondary | ICD-10-CM

## 2018-07-25 DIAGNOSIS — I5032 Chronic diastolic (congestive) heart failure: Secondary | ICD-10-CM

## 2018-07-26 DIAGNOSIS — I5032 Chronic diastolic (congestive) heart failure: Secondary | ICD-10-CM

## 2018-07-26 DIAGNOSIS — J189 Pneumonia, unspecified organism: Secondary | ICD-10-CM

## 2018-07-26 DIAGNOSIS — N179 Acute kidney failure, unspecified: Secondary | ICD-10-CM

## 2018-07-26 DIAGNOSIS — J9621 Acute and chronic respiratory failure with hypoxia: Secondary | ICD-10-CM

## 2018-07-26 DIAGNOSIS — I959 Hypotension, unspecified: Secondary | ICD-10-CM

## 2018-07-27 DIAGNOSIS — J189 Pneumonia, unspecified organism: Secondary | ICD-10-CM

## 2018-07-27 DIAGNOSIS — I5032 Chronic diastolic (congestive) heart failure: Secondary | ICD-10-CM

## 2018-07-27 DIAGNOSIS — N179 Acute kidney failure, unspecified: Secondary | ICD-10-CM

## 2018-07-27 DIAGNOSIS — I959 Hypotension, unspecified: Secondary | ICD-10-CM

## 2018-07-27 DIAGNOSIS — J9621 Acute and chronic respiratory failure with hypoxia: Secondary | ICD-10-CM

## 2018-07-28 DIAGNOSIS — I5032 Chronic diastolic (congestive) heart failure: Secondary | ICD-10-CM

## 2018-07-28 DIAGNOSIS — J189 Pneumonia, unspecified organism: Secondary | ICD-10-CM

## 2018-07-28 DIAGNOSIS — N179 Acute kidney failure, unspecified: Secondary | ICD-10-CM

## 2018-07-28 DIAGNOSIS — I959 Hypotension, unspecified: Secondary | ICD-10-CM

## 2018-07-28 DIAGNOSIS — J9621 Acute and chronic respiratory failure with hypoxia: Secondary | ICD-10-CM

## 2018-07-29 DIAGNOSIS — J189 Pneumonia, unspecified organism: Secondary | ICD-10-CM

## 2018-07-29 DIAGNOSIS — J9621 Acute and chronic respiratory failure with hypoxia: Secondary | ICD-10-CM

## 2018-07-29 DIAGNOSIS — N179 Acute kidney failure, unspecified: Secondary | ICD-10-CM

## 2018-07-29 DIAGNOSIS — I959 Hypotension, unspecified: Secondary | ICD-10-CM

## 2018-07-29 DIAGNOSIS — I5032 Chronic diastolic (congestive) heart failure: Secondary | ICD-10-CM

## 2018-07-30 DIAGNOSIS — N179 Acute kidney failure, unspecified: Secondary | ICD-10-CM

## 2018-07-30 DIAGNOSIS — I5032 Chronic diastolic (congestive) heart failure: Secondary | ICD-10-CM

## 2018-07-30 DIAGNOSIS — I959 Hypotension, unspecified: Secondary | ICD-10-CM

## 2018-07-30 DIAGNOSIS — J189 Pneumonia, unspecified organism: Secondary | ICD-10-CM

## 2018-07-30 DIAGNOSIS — J9621 Acute and chronic respiratory failure with hypoxia: Secondary | ICD-10-CM

## 2018-07-31 DIAGNOSIS — N179 Acute kidney failure, unspecified: Secondary | ICD-10-CM

## 2018-07-31 DIAGNOSIS — I5032 Chronic diastolic (congestive) heart failure: Secondary | ICD-10-CM

## 2018-07-31 DIAGNOSIS — I959 Hypotension, unspecified: Secondary | ICD-10-CM

## 2018-07-31 DIAGNOSIS — J189 Pneumonia, unspecified organism: Secondary | ICD-10-CM

## 2018-07-31 DIAGNOSIS — J9621 Acute and chronic respiratory failure with hypoxia: Secondary | ICD-10-CM

## 2018-08-01 DIAGNOSIS — I5032 Chronic diastolic (congestive) heart failure: Secondary | ICD-10-CM

## 2018-08-01 DIAGNOSIS — J9621 Acute and chronic respiratory failure with hypoxia: Secondary | ICD-10-CM

## 2018-08-01 DIAGNOSIS — N179 Acute kidney failure, unspecified: Secondary | ICD-10-CM

## 2018-08-01 DIAGNOSIS — I959 Hypotension, unspecified: Secondary | ICD-10-CM

## 2018-08-01 DIAGNOSIS — J189 Pneumonia, unspecified organism: Secondary | ICD-10-CM

## 2018-08-02 DIAGNOSIS — I959 Hypotension, unspecified: Secondary | ICD-10-CM

## 2018-08-02 DIAGNOSIS — J189 Pneumonia, unspecified organism: Secondary | ICD-10-CM

## 2018-08-02 DIAGNOSIS — I5032 Chronic diastolic (congestive) heart failure: Secondary | ICD-10-CM

## 2018-08-02 DIAGNOSIS — N179 Acute kidney failure, unspecified: Secondary | ICD-10-CM

## 2018-08-02 DIAGNOSIS — J9621 Acute and chronic respiratory failure with hypoxia: Secondary | ICD-10-CM

## 2018-08-03 DIAGNOSIS — J9621 Acute and chronic respiratory failure with hypoxia: Secondary | ICD-10-CM

## 2018-08-03 DIAGNOSIS — I959 Hypotension, unspecified: Secondary | ICD-10-CM

## 2018-08-03 DIAGNOSIS — I5032 Chronic diastolic (congestive) heart failure: Secondary | ICD-10-CM

## 2018-08-03 DIAGNOSIS — N179 Acute kidney failure, unspecified: Secondary | ICD-10-CM

## 2018-08-03 DIAGNOSIS — J189 Pneumonia, unspecified organism: Secondary | ICD-10-CM

## 2018-08-10 DIAGNOSIS — N179 Acute kidney failure, unspecified: Secondary | ICD-10-CM

## 2018-08-10 DIAGNOSIS — J9621 Acute and chronic respiratory failure with hypoxia: Secondary | ICD-10-CM

## 2018-08-10 DIAGNOSIS — J189 Pneumonia, unspecified organism: Secondary | ICD-10-CM

## 2018-08-10 DIAGNOSIS — I5032 Chronic diastolic (congestive) heart failure: Secondary | ICD-10-CM

## 2018-08-10 DIAGNOSIS — I959 Hypotension, unspecified: Secondary | ICD-10-CM

## 2018-08-11 DIAGNOSIS — I5032 Chronic diastolic (congestive) heart failure: Secondary | ICD-10-CM

## 2018-08-11 DIAGNOSIS — I959 Hypotension, unspecified: Secondary | ICD-10-CM

## 2018-08-11 DIAGNOSIS — J9621 Acute and chronic respiratory failure with hypoxia: Secondary | ICD-10-CM

## 2018-08-11 DIAGNOSIS — N179 Acute kidney failure, unspecified: Secondary | ICD-10-CM

## 2018-08-11 DIAGNOSIS — J189 Pneumonia, unspecified organism: Secondary | ICD-10-CM

## 2018-08-12 DIAGNOSIS — I5032 Chronic diastolic (congestive) heart failure: Secondary | ICD-10-CM

## 2018-08-12 DIAGNOSIS — J9621 Acute and chronic respiratory failure with hypoxia: Secondary | ICD-10-CM

## 2018-08-12 DIAGNOSIS — N179 Acute kidney failure, unspecified: Secondary | ICD-10-CM

## 2018-08-12 DIAGNOSIS — I959 Hypotension, unspecified: Secondary | ICD-10-CM

## 2018-08-12 DIAGNOSIS — J189 Pneumonia, unspecified organism: Secondary | ICD-10-CM

## 2018-08-13 DIAGNOSIS — I959 Hypotension, unspecified: Secondary | ICD-10-CM

## 2018-08-13 DIAGNOSIS — N179 Acute kidney failure, unspecified: Secondary | ICD-10-CM

## 2018-08-13 DIAGNOSIS — J9621 Acute and chronic respiratory failure with hypoxia: Secondary | ICD-10-CM

## 2018-08-13 DIAGNOSIS — I5032 Chronic diastolic (congestive) heart failure: Secondary | ICD-10-CM

## 2018-08-13 DIAGNOSIS — J189 Pneumonia, unspecified organism: Secondary | ICD-10-CM

## 2018-08-14 DIAGNOSIS — J9621 Acute and chronic respiratory failure with hypoxia: Secondary | ICD-10-CM

## 2018-08-14 DIAGNOSIS — N179 Acute kidney failure, unspecified: Secondary | ICD-10-CM

## 2018-08-14 DIAGNOSIS — J189 Pneumonia, unspecified organism: Secondary | ICD-10-CM

## 2018-08-14 DIAGNOSIS — I959 Hypotension, unspecified: Secondary | ICD-10-CM

## 2018-08-14 DIAGNOSIS — I5032 Chronic diastolic (congestive) heart failure: Secondary | ICD-10-CM

## 2018-08-15 DIAGNOSIS — I959 Hypotension, unspecified: Secondary | ICD-10-CM

## 2018-08-15 DIAGNOSIS — I5032 Chronic diastolic (congestive) heart failure: Secondary | ICD-10-CM

## 2018-08-15 DIAGNOSIS — J189 Pneumonia, unspecified organism: Secondary | ICD-10-CM

## 2018-08-15 DIAGNOSIS — N179 Acute kidney failure, unspecified: Secondary | ICD-10-CM

## 2018-08-15 DIAGNOSIS — J9621 Acute and chronic respiratory failure with hypoxia: Secondary | ICD-10-CM

## 2018-08-16 DIAGNOSIS — N179 Acute kidney failure, unspecified: Secondary | ICD-10-CM

## 2018-08-16 DIAGNOSIS — J189 Pneumonia, unspecified organism: Secondary | ICD-10-CM

## 2018-08-16 DIAGNOSIS — I5032 Chronic diastolic (congestive) heart failure: Secondary | ICD-10-CM

## 2018-08-16 DIAGNOSIS — I959 Hypotension, unspecified: Secondary | ICD-10-CM

## 2018-08-16 DIAGNOSIS — J9621 Acute and chronic respiratory failure with hypoxia: Secondary | ICD-10-CM

## 2018-08-24 DIAGNOSIS — N179 Acute kidney failure, unspecified: Secondary | ICD-10-CM

## 2018-08-24 DIAGNOSIS — I959 Hypotension, unspecified: Secondary | ICD-10-CM

## 2018-08-24 DIAGNOSIS — J189 Pneumonia, unspecified organism: Secondary | ICD-10-CM

## 2018-08-24 DIAGNOSIS — I5032 Chronic diastolic (congestive) heart failure: Secondary | ICD-10-CM

## 2018-08-24 DIAGNOSIS — J9621 Acute and chronic respiratory failure with hypoxia: Secondary | ICD-10-CM

## 2018-08-25 DIAGNOSIS — I959 Hypotension, unspecified: Secondary | ICD-10-CM

## 2018-08-25 DIAGNOSIS — N179 Acute kidney failure, unspecified: Secondary | ICD-10-CM

## 2018-08-25 DIAGNOSIS — J9621 Acute and chronic respiratory failure with hypoxia: Secondary | ICD-10-CM

## 2018-08-25 DIAGNOSIS — J189 Pneumonia, unspecified organism: Secondary | ICD-10-CM

## 2018-08-25 DIAGNOSIS — I5032 Chronic diastolic (congestive) heart failure: Secondary | ICD-10-CM

## 2018-08-26 DIAGNOSIS — I959 Hypotension, unspecified: Secondary | ICD-10-CM

## 2018-08-26 DIAGNOSIS — J9621 Acute and chronic respiratory failure with hypoxia: Secondary | ICD-10-CM

## 2018-08-26 DIAGNOSIS — I5032 Chronic diastolic (congestive) heart failure: Secondary | ICD-10-CM

## 2018-08-26 DIAGNOSIS — N179 Acute kidney failure, unspecified: Secondary | ICD-10-CM

## 2018-08-26 DIAGNOSIS — J189 Pneumonia, unspecified organism: Secondary | ICD-10-CM

## 2018-08-27 DIAGNOSIS — J9621 Acute and chronic respiratory failure with hypoxia: Secondary | ICD-10-CM

## 2018-08-27 DIAGNOSIS — I959 Hypotension, unspecified: Secondary | ICD-10-CM

## 2018-08-27 DIAGNOSIS — J189 Pneumonia, unspecified organism: Secondary | ICD-10-CM

## 2018-08-27 DIAGNOSIS — N179 Acute kidney failure, unspecified: Secondary | ICD-10-CM

## 2018-08-27 DIAGNOSIS — I5032 Chronic diastolic (congestive) heart failure: Secondary | ICD-10-CM

## 2018-08-28 DIAGNOSIS — J189 Pneumonia, unspecified organism: Secondary | ICD-10-CM

## 2018-08-28 DIAGNOSIS — N179 Acute kidney failure, unspecified: Secondary | ICD-10-CM

## 2018-08-28 DIAGNOSIS — I5032 Chronic diastolic (congestive) heart failure: Secondary | ICD-10-CM

## 2018-08-28 DIAGNOSIS — J9621 Acute and chronic respiratory failure with hypoxia: Secondary | ICD-10-CM

## 2018-08-28 DIAGNOSIS — I959 Hypotension, unspecified: Secondary | ICD-10-CM

## 2018-08-29 DIAGNOSIS — J189 Pneumonia, unspecified organism: Secondary | ICD-10-CM

## 2018-08-29 DIAGNOSIS — N179 Acute kidney failure, unspecified: Secondary | ICD-10-CM

## 2018-08-29 DIAGNOSIS — I959 Hypotension, unspecified: Secondary | ICD-10-CM

## 2018-08-29 DIAGNOSIS — I5032 Chronic diastolic (congestive) heart failure: Secondary | ICD-10-CM

## 2018-08-29 DIAGNOSIS — J9621 Acute and chronic respiratory failure with hypoxia: Secondary | ICD-10-CM

## 2018-08-30 DIAGNOSIS — J9621 Acute and chronic respiratory failure with hypoxia: Secondary | ICD-10-CM

## 2018-08-30 DIAGNOSIS — I5032 Chronic diastolic (congestive) heart failure: Secondary | ICD-10-CM

## 2018-08-30 DIAGNOSIS — J189 Pneumonia, unspecified organism: Secondary | ICD-10-CM

## 2018-08-30 DIAGNOSIS — N179 Acute kidney failure, unspecified: Secondary | ICD-10-CM

## 2018-08-30 DIAGNOSIS — I959 Hypotension, unspecified: Secondary | ICD-10-CM

## 2018-09-07 DIAGNOSIS — J9621 Acute and chronic respiratory failure with hypoxia: Secondary | ICD-10-CM

## 2018-09-07 DIAGNOSIS — I959 Hypotension, unspecified: Secondary | ICD-10-CM

## 2018-09-07 DIAGNOSIS — N179 Acute kidney failure, unspecified: Secondary | ICD-10-CM

## 2018-09-07 DIAGNOSIS — J189 Pneumonia, unspecified organism: Secondary | ICD-10-CM

## 2018-09-07 DIAGNOSIS — I5032 Chronic diastolic (congestive) heart failure: Secondary | ICD-10-CM

## 2018-09-08 DIAGNOSIS — I5032 Chronic diastolic (congestive) heart failure: Secondary | ICD-10-CM

## 2018-09-08 DIAGNOSIS — J189 Pneumonia, unspecified organism: Secondary | ICD-10-CM

## 2018-09-08 DIAGNOSIS — J9621 Acute and chronic respiratory failure with hypoxia: Secondary | ICD-10-CM

## 2018-09-08 DIAGNOSIS — N179 Acute kidney failure, unspecified: Secondary | ICD-10-CM

## 2018-09-08 DIAGNOSIS — I959 Hypotension, unspecified: Secondary | ICD-10-CM

## 2018-09-09 DIAGNOSIS — J9621 Acute and chronic respiratory failure with hypoxia: Secondary | ICD-10-CM

## 2018-09-09 DIAGNOSIS — N179 Acute kidney failure, unspecified: Secondary | ICD-10-CM

## 2018-09-09 DIAGNOSIS — I5032 Chronic diastolic (congestive) heart failure: Secondary | ICD-10-CM

## 2018-09-09 DIAGNOSIS — I959 Hypotension, unspecified: Secondary | ICD-10-CM

## 2018-09-09 DIAGNOSIS — J189 Pneumonia, unspecified organism: Secondary | ICD-10-CM

## 2018-09-10 DIAGNOSIS — I959 Hypotension, unspecified: Secondary | ICD-10-CM

## 2018-09-10 DIAGNOSIS — J189 Pneumonia, unspecified organism: Secondary | ICD-10-CM

## 2018-09-10 DIAGNOSIS — J9621 Acute and chronic respiratory failure with hypoxia: Secondary | ICD-10-CM

## 2018-09-10 DIAGNOSIS — N179 Acute kidney failure, unspecified: Secondary | ICD-10-CM

## 2018-09-10 DIAGNOSIS — I5032 Chronic diastolic (congestive) heart failure: Secondary | ICD-10-CM

## 2018-09-11 DIAGNOSIS — J189 Pneumonia, unspecified organism: Secondary | ICD-10-CM

## 2018-09-11 DIAGNOSIS — N179 Acute kidney failure, unspecified: Secondary | ICD-10-CM

## 2018-09-11 DIAGNOSIS — I5032 Chronic diastolic (congestive) heart failure: Secondary | ICD-10-CM

## 2018-09-11 DIAGNOSIS — I959 Hypotension, unspecified: Secondary | ICD-10-CM

## 2018-09-11 DIAGNOSIS — J9621 Acute and chronic respiratory failure with hypoxia: Secondary | ICD-10-CM

## 2018-09-12 DIAGNOSIS — I5032 Chronic diastolic (congestive) heart failure: Secondary | ICD-10-CM

## 2018-09-12 DIAGNOSIS — N179 Acute kidney failure, unspecified: Secondary | ICD-10-CM

## 2018-09-12 DIAGNOSIS — J9621 Acute and chronic respiratory failure with hypoxia: Secondary | ICD-10-CM

## 2018-09-12 DIAGNOSIS — I959 Hypotension, unspecified: Secondary | ICD-10-CM

## 2018-09-12 DIAGNOSIS — J189 Pneumonia, unspecified organism: Secondary | ICD-10-CM

## 2018-09-13 DIAGNOSIS — I5032 Chronic diastolic (congestive) heart failure: Secondary | ICD-10-CM

## 2018-09-13 DIAGNOSIS — I959 Hypotension, unspecified: Secondary | ICD-10-CM

## 2018-09-13 DIAGNOSIS — J9621 Acute and chronic respiratory failure with hypoxia: Secondary | ICD-10-CM

## 2018-09-13 DIAGNOSIS — N179 Acute kidney failure, unspecified: Secondary | ICD-10-CM

## 2018-09-13 DIAGNOSIS — J189 Pneumonia, unspecified organism: Secondary | ICD-10-CM

## 2018-09-22 DEATH — deceased

## 2019-07-16 IMAGING — DX DG CHEST 1V PORT
1 series · 1 of 1 positions shown · non-contrast
Comparison: Portable chest x-ray January 17, 2018 and chest CT
scan of the same day.

CLINICAL DATA: Fever, nausea, and vomiting. History of breast
malignancy with bilateral mastectomy, diabetes.

EXAM:
PORTABLE CHEST 1 VIEW

[chest]
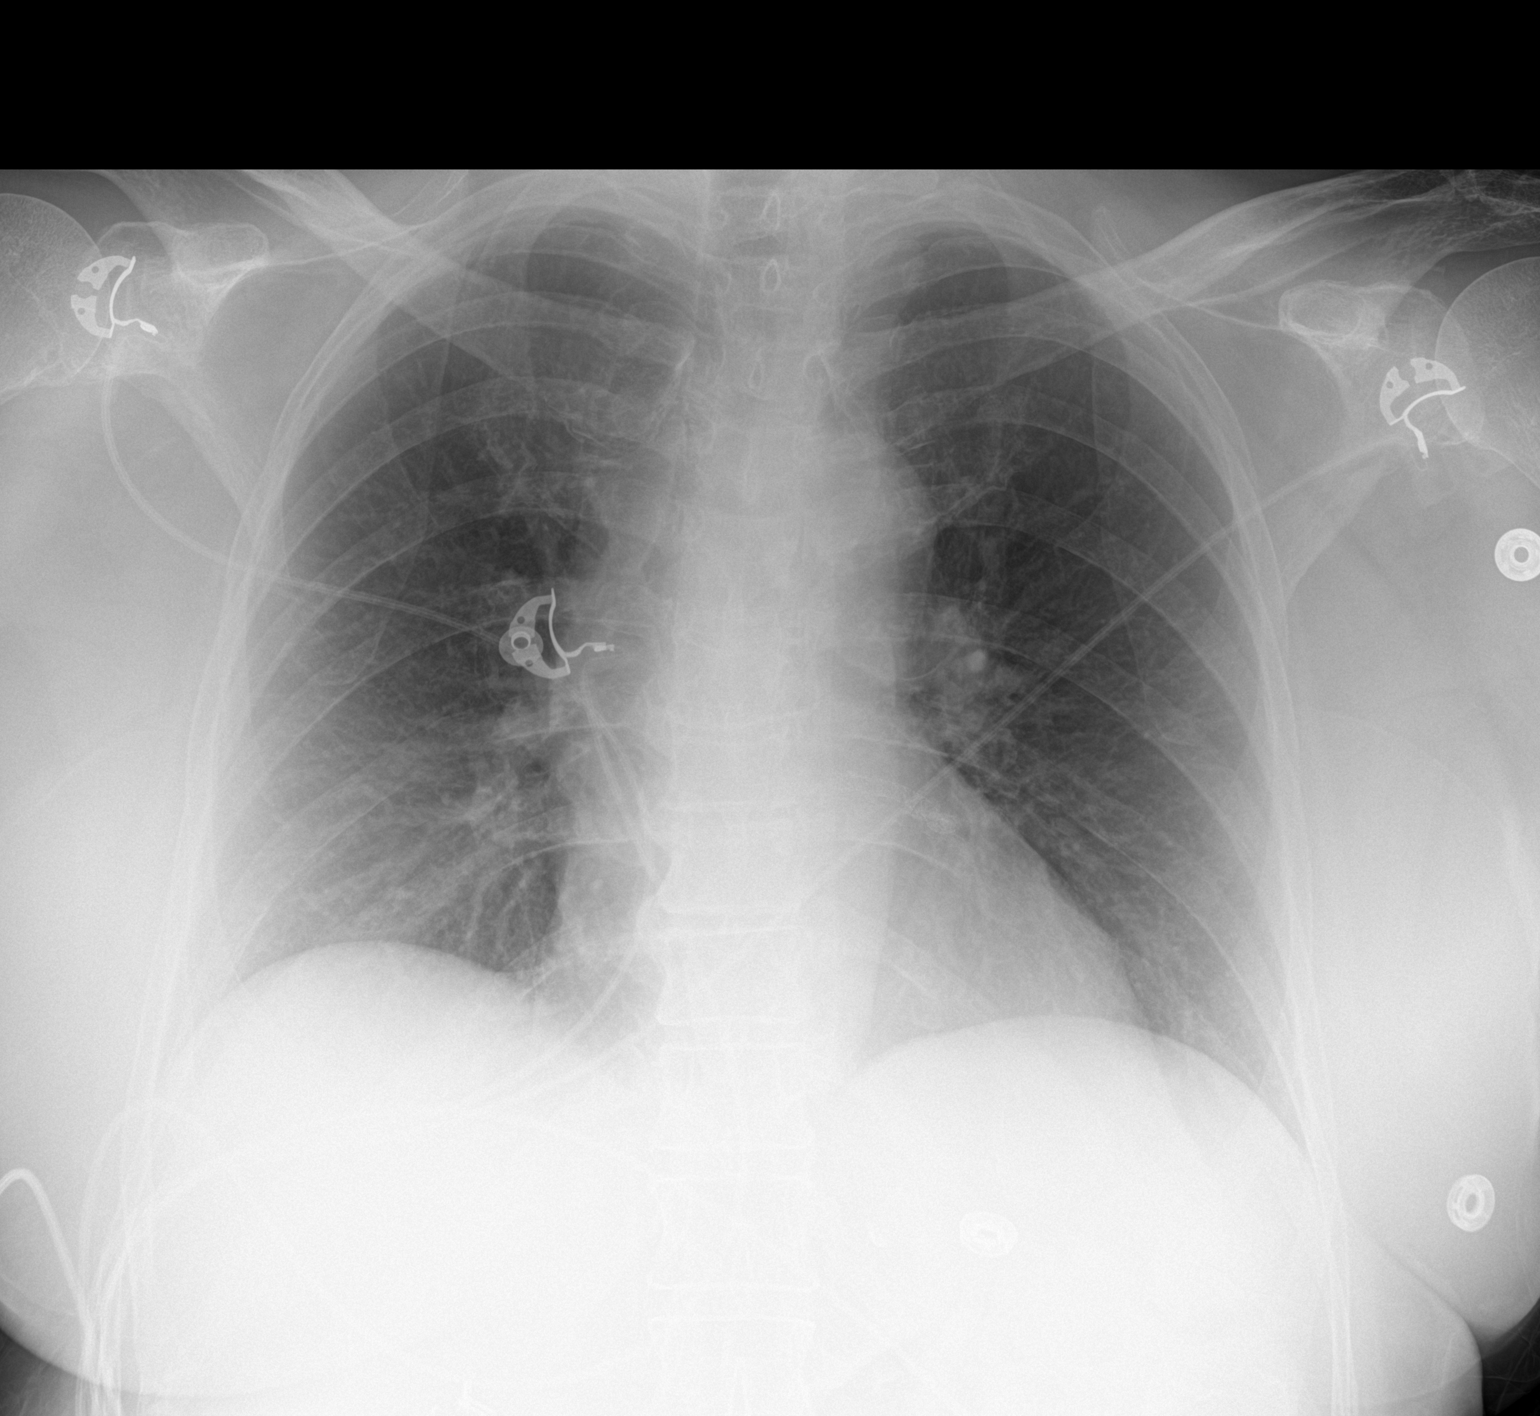

[1 of 1 positions shown; findings below may reference images not displayed]

FINDINGS: The lungs are adequately inflated and clear. The heart and pulmonary
vascularity are normal. The mediastinum is normal in width. There is
no pleural effusion. The bony thorax is unremarkable.
IMPRESSION: There is no active cardiopulmonary disease.

## 2019-09-15 IMAGING — DX DG ABDOMEN 1V
2 series · 2 of 2 positions shown · non-contrast
Comparison: 03/01/2018 CT and prior studies .

CLINICAL DATA: Acute vomiting.

EXAM:
ABDOMEN - 1 VIEW

[abdomen kub (1 of 2)]
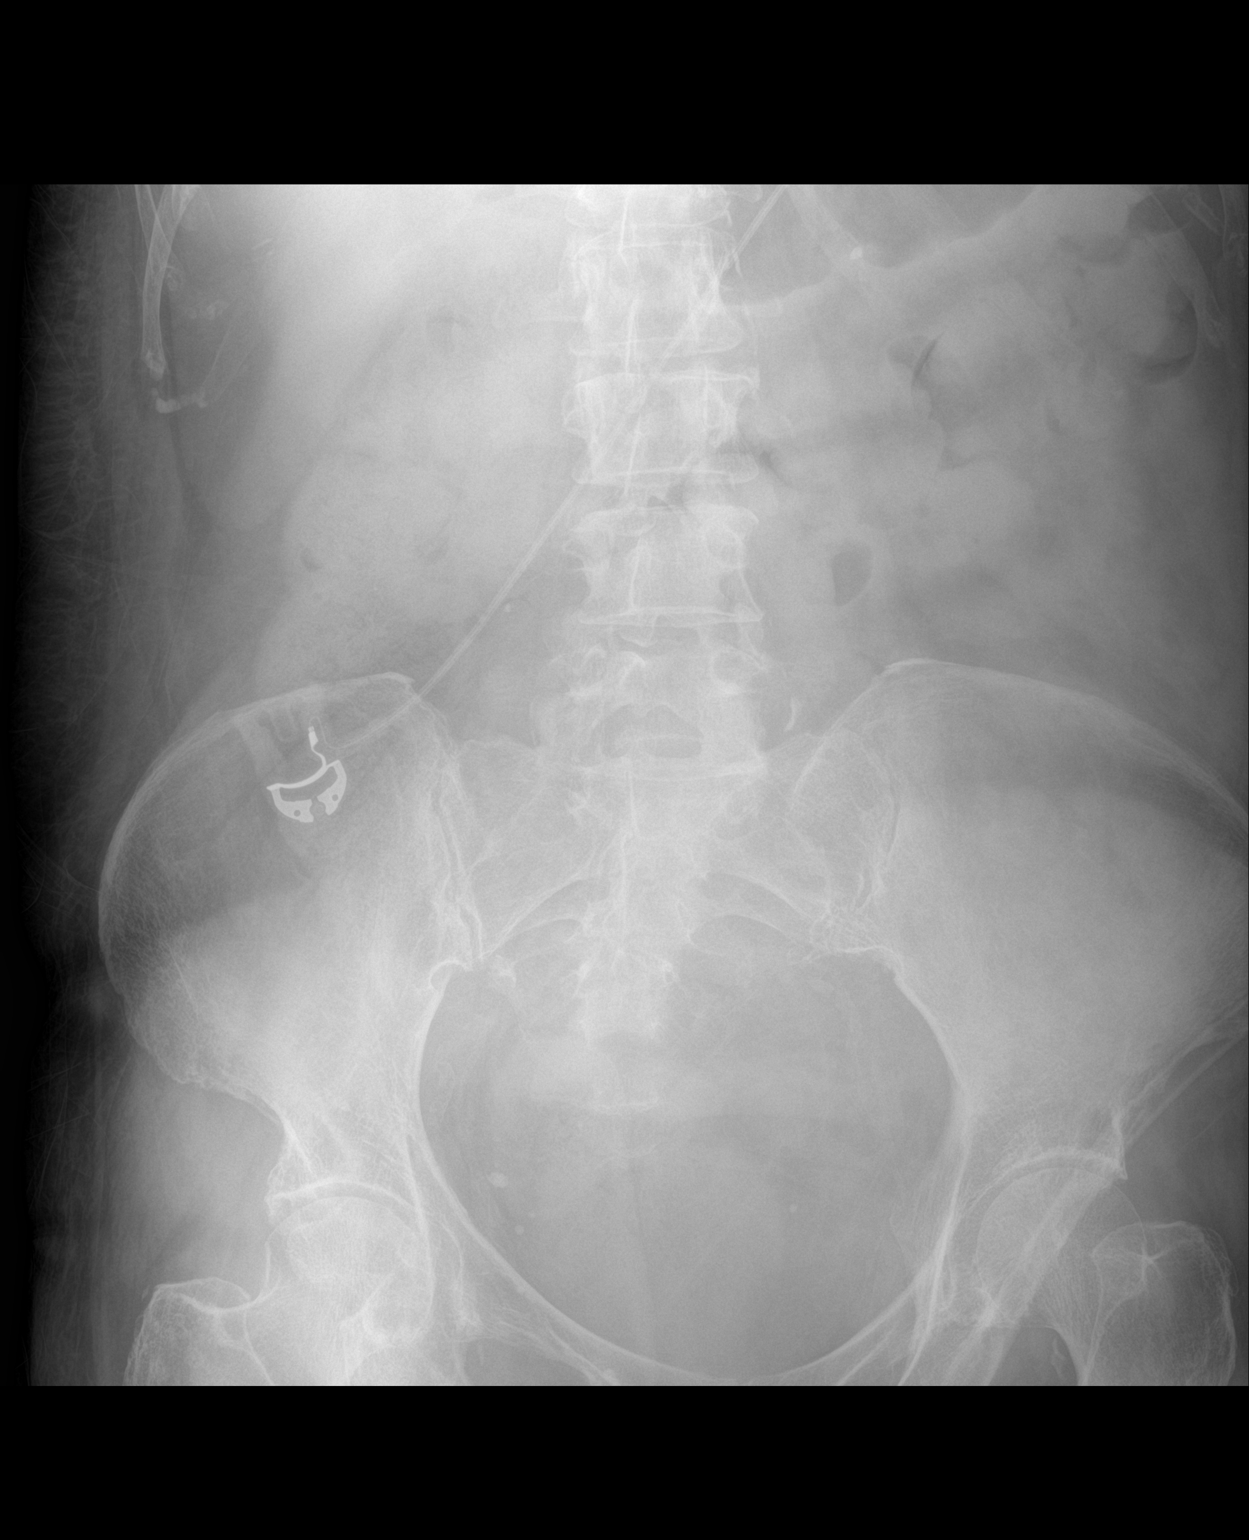

[abdomen kub (2 of 2)]
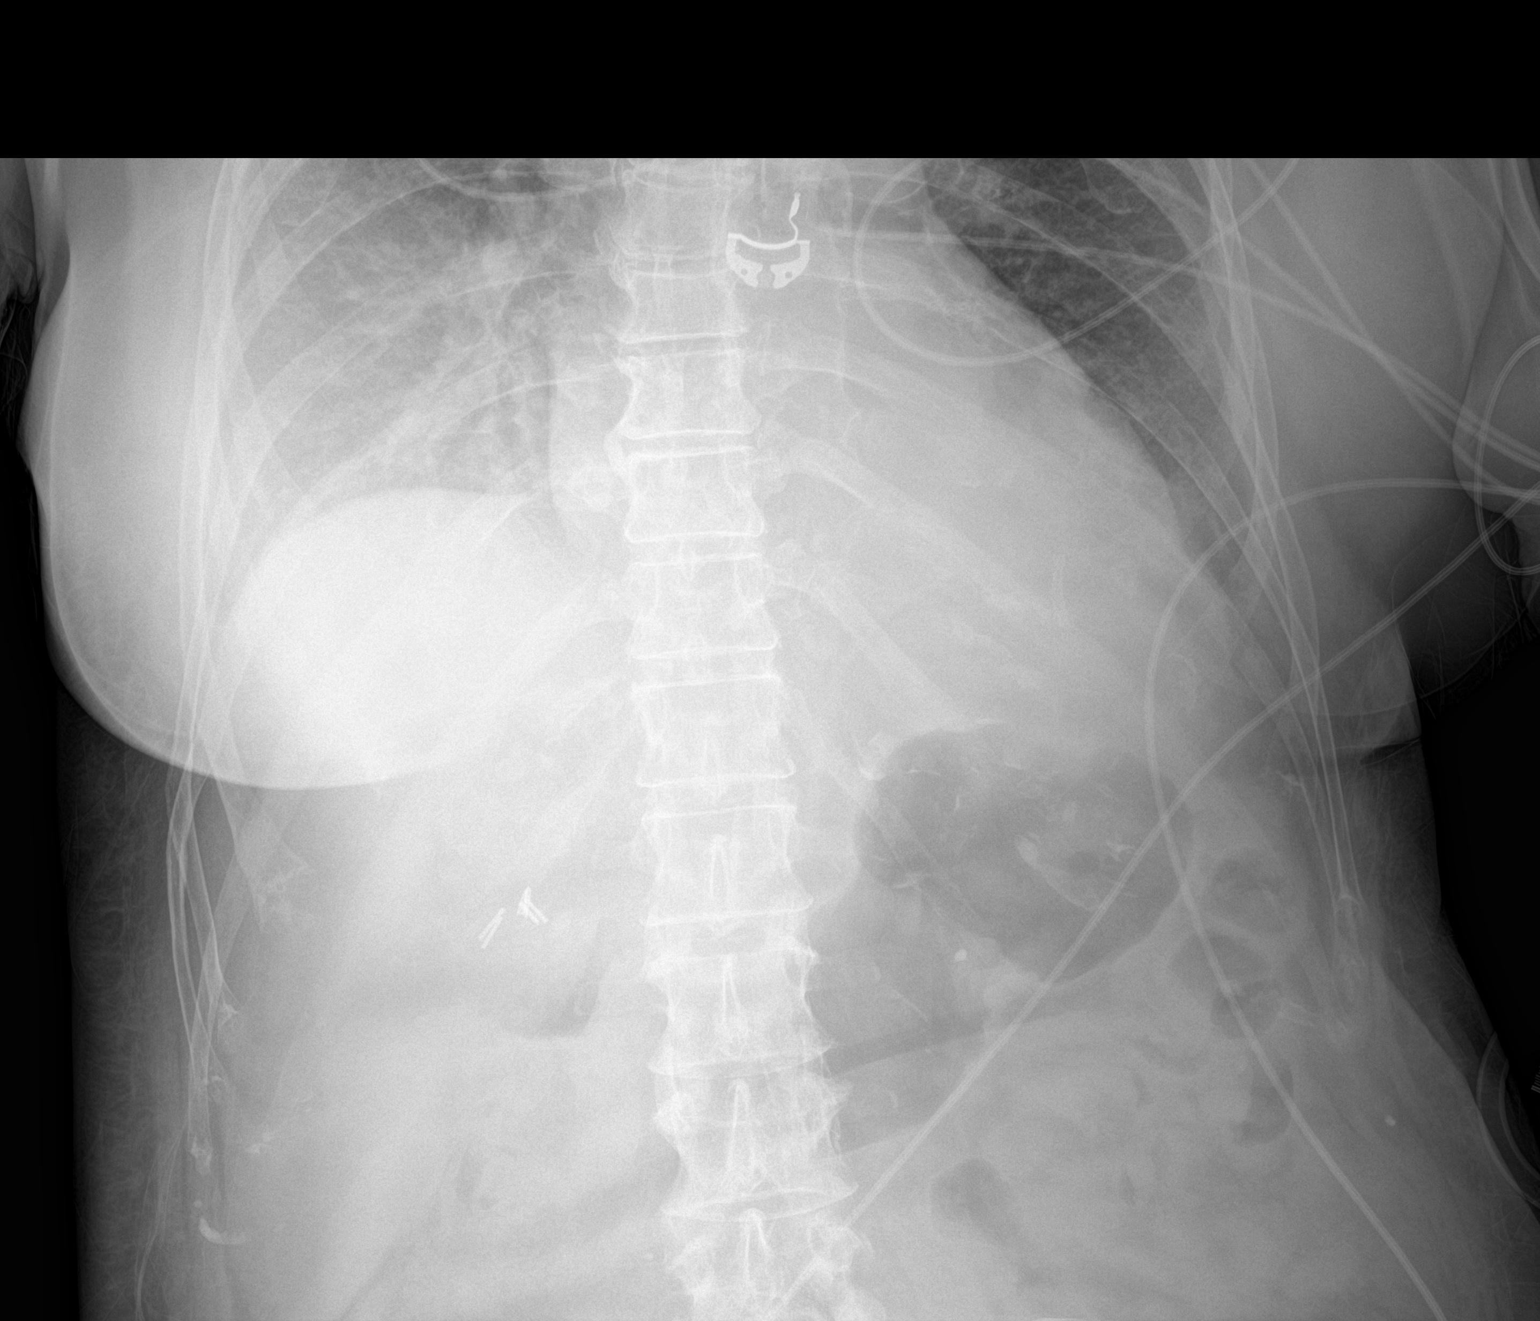

[2 of 2 positions shown; findings below may reference images not displayed]

FINDINGS: The bowel gas pattern is unremarkable.

No dilated bowel loops noted.

No suspicious calcifications are present.

Cholecystectomy clips are present.
IMPRESSION: No acute abnormality.

## 2019-09-17 ENCOUNTER — Encounter (HOSPITAL_COMMUNITY): Payer: Self-pay | Admitting: Certified Registered Nurse Anesthetist

## 2019-10-05 IMAGING — DX PORTABLE CHEST - 1 VIEW
1 series · 1 of 1 positions shown · non-contrast
Comparison: 03/17/2018

CLINICAL DATA: Shortness of breath.

EXAM:
PORTABLE CHEST 1 VIEW

[chest ap]
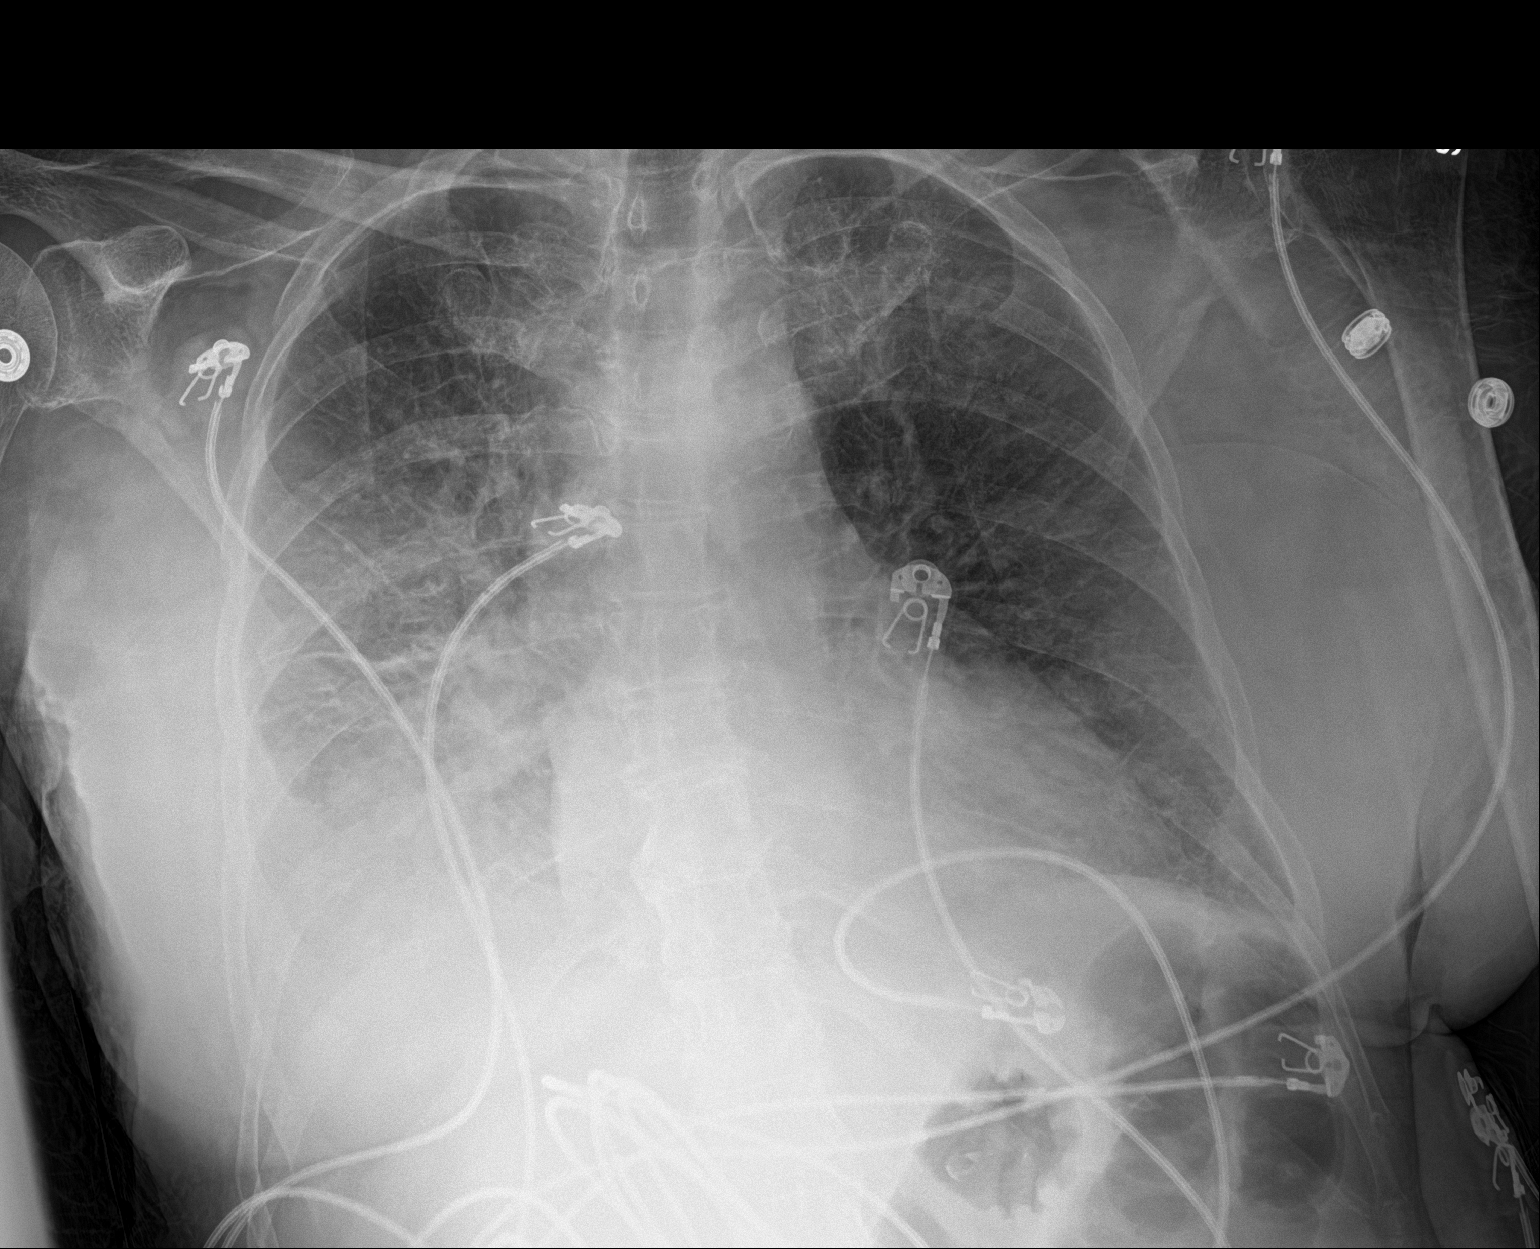

[1 of 1 positions shown; findings below may reference images not displayed]

FINDINGS: Cardiomediastinal silhouette is enlarged. Mediastinal contours
appear intact.

There is increasing airspace opacity in the right lung base.
Interstitial pulmonary edema.

Osseous structures are without acute abnormality. Soft tissues are
grossly normal.
IMPRESSION: 1. Confluent airspace opacity in the right lung base may represent
increasing right pleural effusion with compressive atelectasis or
infectious airspace consolidation. Possible peribronchial airspace
consolidation in the left lower lobe.
2. Enlarged cardiac silhouette.
3. Interstitial pulmonary edema.

## 2019-11-03 IMAGING — US LEFT LOWER EXTREMITY SOFT TISSUE ULTRASOUND LIMITED
1 series · 13 of 13 positions shown · non-contrast
Comparison: None.

CLINICAL DATA: Indurated areas in left buttocks region. Evaluate
for abscess.

EXAM:
ULTRASOUND LEFT LOWER EXTREMITY LIMITED
TECHNIQUE: Ultrasound examination of the lower extremity soft tissues was
performed in the area of clinical concern.

[Series 1: left lower extremity soft tissue ultrasound limite · 13 acquisitions, 13 frames shown]
[im 1/13]
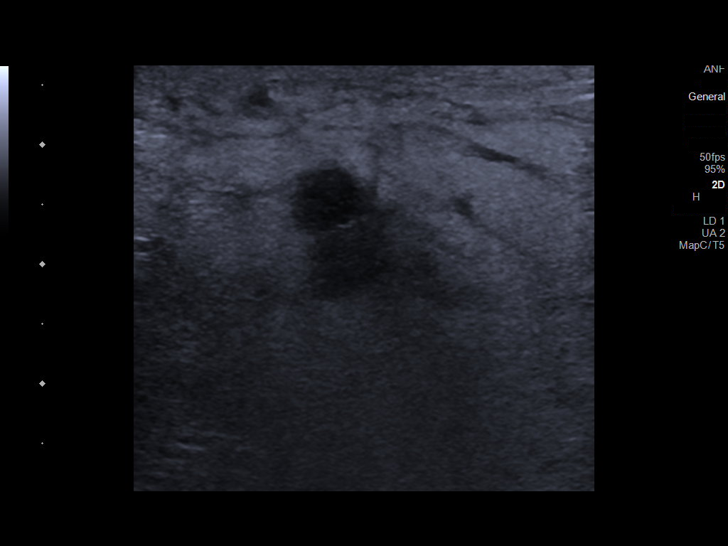
[im 2/13]
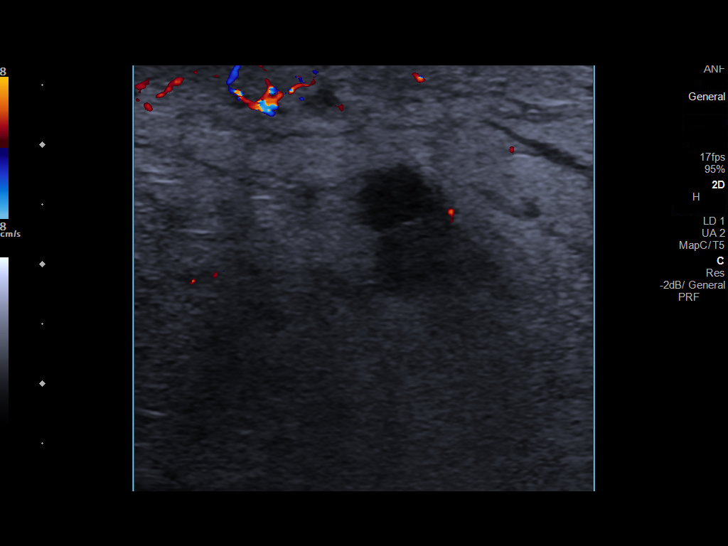
[im 3/13]
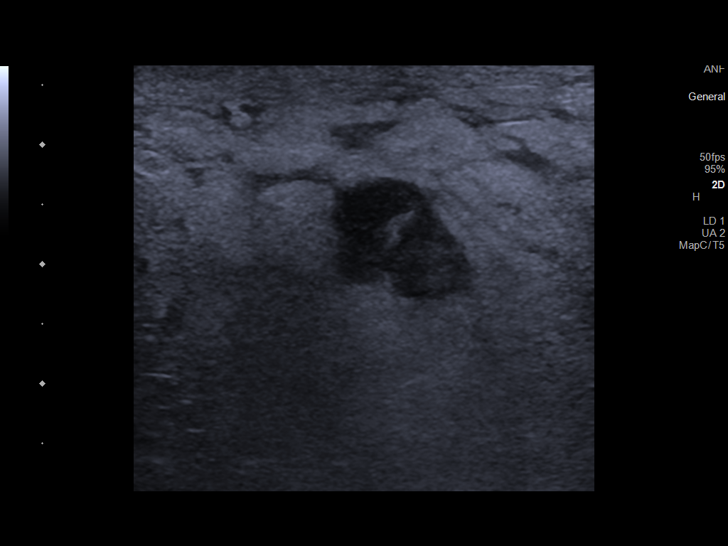
[im 4/13]
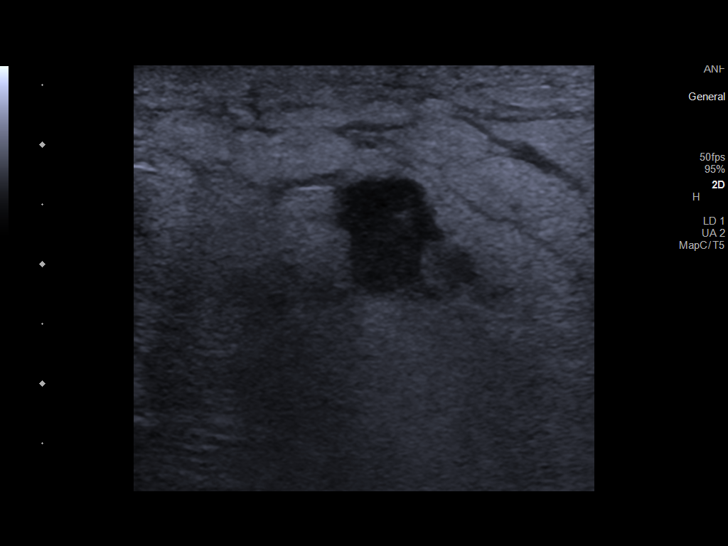
[im 5/13]
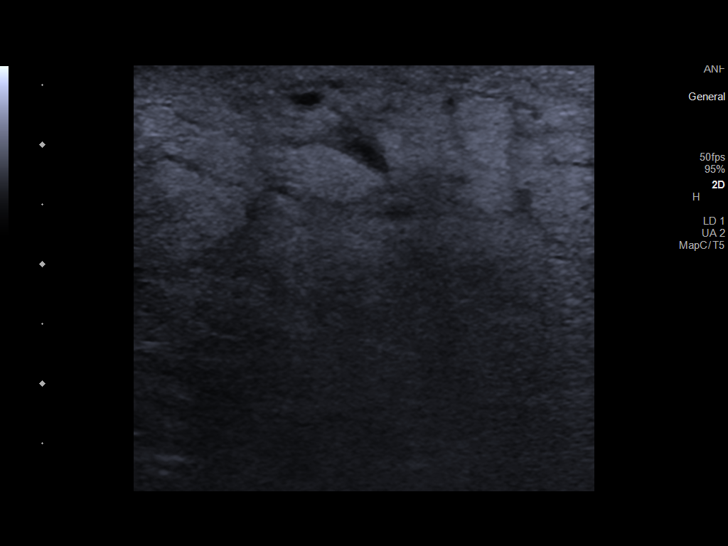
[im 6/13]
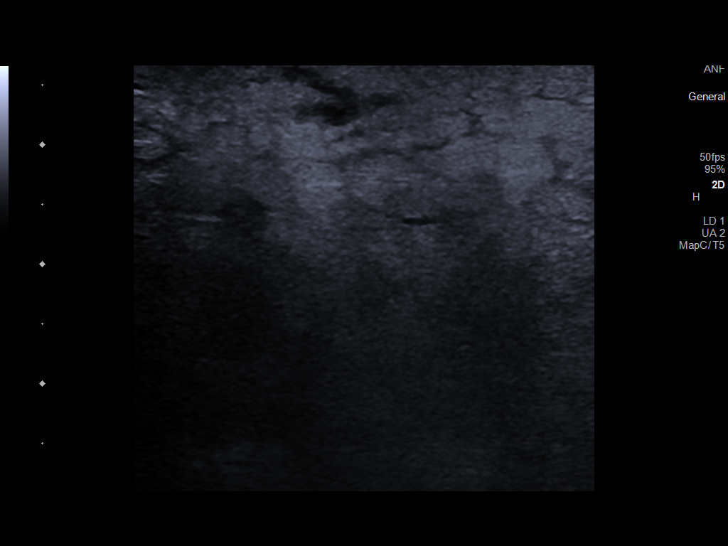
[im 7/13]
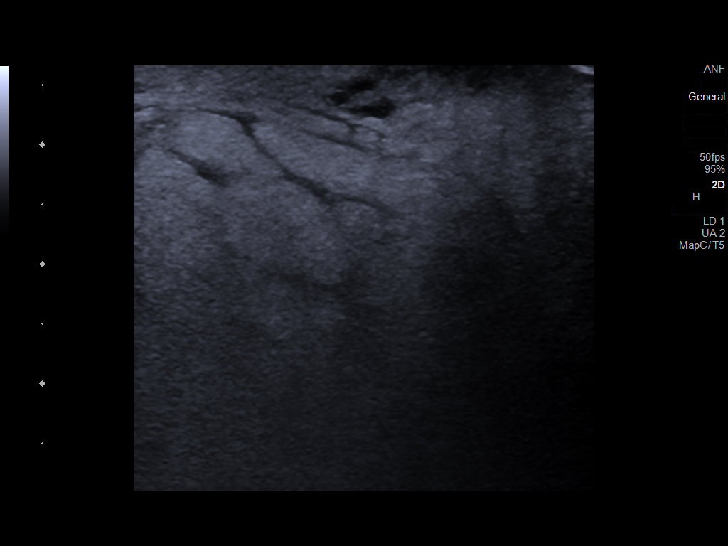
[im 8/13]
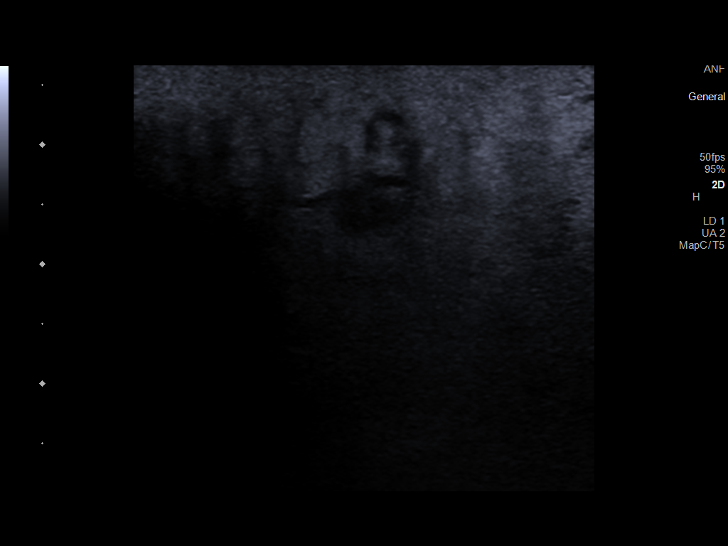
[im 9/13]
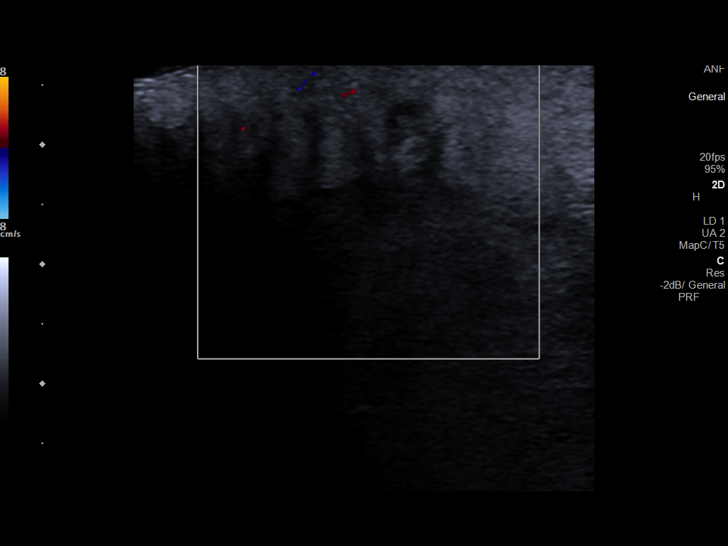
[im 10/13]
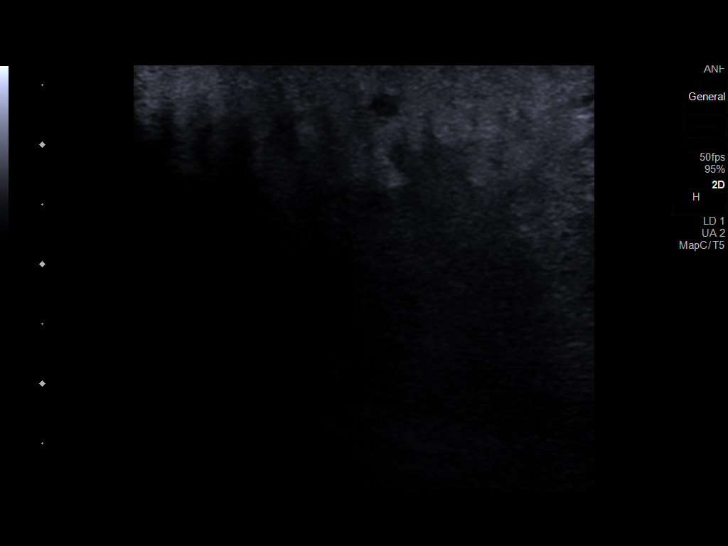
[im 11/13]
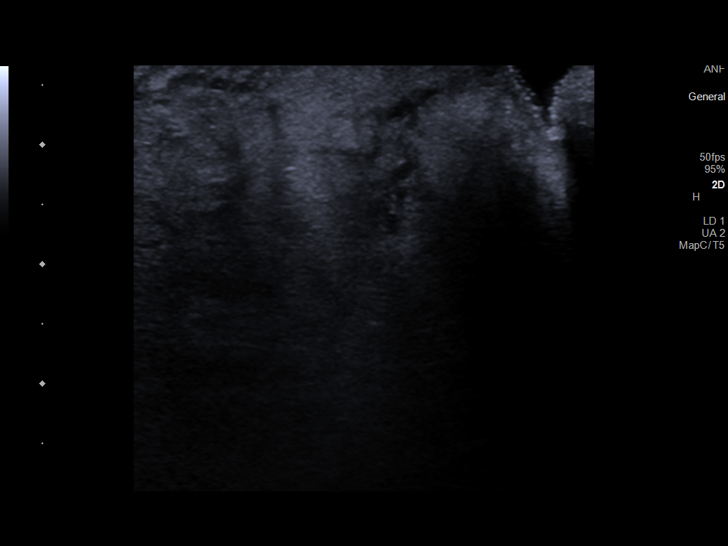
[im 12/13]
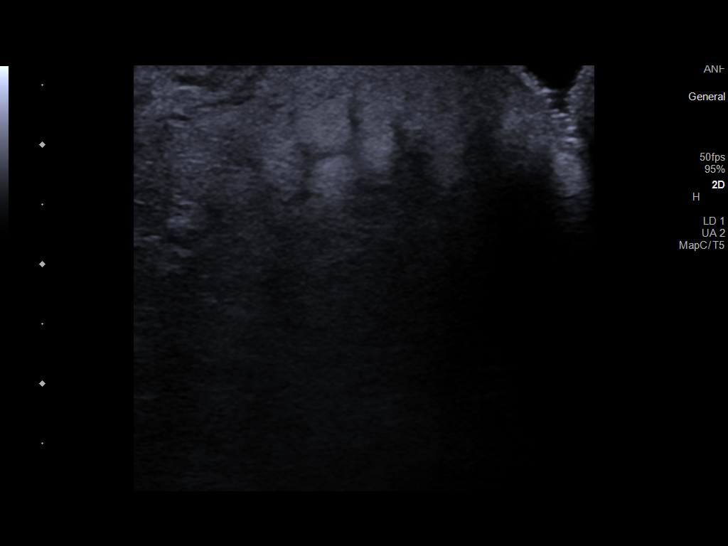
[im 13/13]
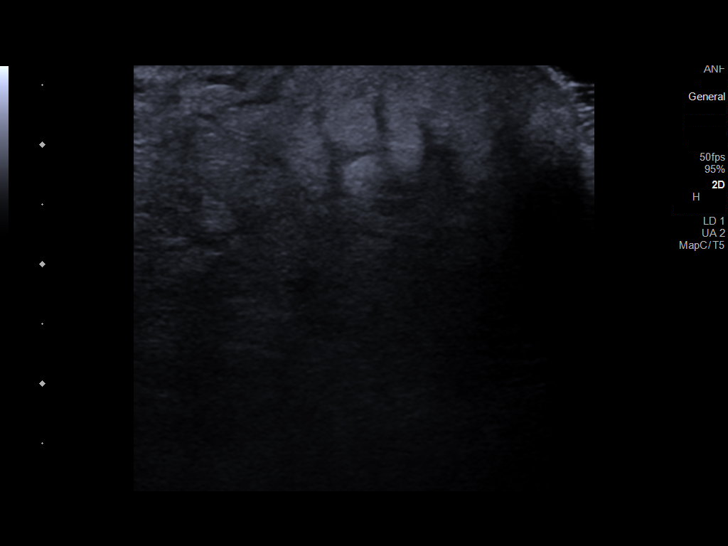

[13 of 13 positions shown; findings below may reference images not displayed]

FINDINGS: Focused ultrasound exam was performed of the medial left buttock
region, assessing the 2 more focal areas of redness/induration.

The more inferior of the 2 areas shows subcutaneous edema with a
more focal 1.0 x 1.2 cm apparent fluid collection without a
well-defined border by ultrasound.

The more cranial of the 2 areas also shows underlying edema without
a discrete fluid collection evident.
IMPRESSION: Subcutaneous edema noted in the 2 areas of concern involving the
medial left buttocks region. A small 12 mm fluid collection is
identified deep to the more caudal of the 2 areas and may represent
a focal area of confluent edema although superinfection of this
fluid cannot be excluded by ultrasound.
